# Patient Record
Sex: Female | Born: 1960 | Race: Black or African American | Hispanic: No | Marital: Single | Smoking: Former smoker
Health system: Southern US, Community
[De-identification: ages and names within clinical notes are randomized; demographics above are authoritative.]

## PROBLEM LIST (undated history)

## (undated) DIAGNOSIS — K7460 Unspecified cirrhosis of liver: Secondary | ICD-10-CM

## (undated) DIAGNOSIS — K7210 Chronic hepatic failure without coma: Secondary | ICD-10-CM

## (undated) DIAGNOSIS — R0602 Shortness of breath: Secondary | ICD-10-CM

## (undated) DIAGNOSIS — I509 Heart failure, unspecified: Secondary | ICD-10-CM

## (undated) DIAGNOSIS — R569 Unspecified convulsions: Secondary | ICD-10-CM

## (undated) DIAGNOSIS — E7800 Pure hypercholesterolemia, unspecified: Secondary | ICD-10-CM

## (undated) DIAGNOSIS — D649 Anemia, unspecified: Secondary | ICD-10-CM

## (undated) DIAGNOSIS — M62838 Other muscle spasm: Secondary | ICD-10-CM

## (undated) DIAGNOSIS — Z9289 Personal history of other medical treatment: Secondary | ICD-10-CM

## (undated) DIAGNOSIS — Z8619 Personal history of other infectious and parasitic diseases: Secondary | ICD-10-CM

## (undated) DIAGNOSIS — M549 Dorsalgia, unspecified: Secondary | ICD-10-CM

## (undated) DIAGNOSIS — G039 Meningitis, unspecified: Secondary | ICD-10-CM

## (undated) DIAGNOSIS — N049 Nephrotic syndrome with unspecified morphologic changes: Secondary | ICD-10-CM

## (undated) DIAGNOSIS — I10 Essential (primary) hypertension: Secondary | ICD-10-CM

## (undated) DIAGNOSIS — I872 Venous insufficiency (chronic) (peripheral): Secondary | ICD-10-CM

## (undated) DIAGNOSIS — D591 Autoimmune hemolytic anemia, unspecified: Secondary | ICD-10-CM

## (undated) DIAGNOSIS — I674 Hypertensive encephalopathy: Secondary | ICD-10-CM

## (undated) DIAGNOSIS — K7290 Hepatic failure, unspecified without coma: Secondary | ICD-10-CM

## (undated) DIAGNOSIS — B1920 Unspecified viral hepatitis C without hepatic coma: Secondary | ICD-10-CM

## (undated) DIAGNOSIS — I219 Acute myocardial infarction, unspecified: Secondary | ICD-10-CM

## (undated) DIAGNOSIS — M1990 Unspecified osteoarthritis, unspecified site: Secondary | ICD-10-CM

## (undated) DIAGNOSIS — I639 Cerebral infarction, unspecified: Secondary | ICD-10-CM

## (undated) DIAGNOSIS — G8929 Other chronic pain: Secondary | ICD-10-CM

## (undated) DIAGNOSIS — G43909 Migraine, unspecified, not intractable, without status migrainosus: Secondary | ICD-10-CM

## (undated) DIAGNOSIS — F419 Anxiety disorder, unspecified: Secondary | ICD-10-CM

## (undated) DIAGNOSIS — K219 Gastro-esophageal reflux disease without esophagitis: Secondary | ICD-10-CM

## (undated) DIAGNOSIS — E119 Type 2 diabetes mellitus without complications: Secondary | ICD-10-CM

## (undated) DIAGNOSIS — J189 Pneumonia, unspecified organism: Secondary | ICD-10-CM

## (undated) DIAGNOSIS — F29 Unspecified psychosis not due to a substance or known physiological condition: Secondary | ICD-10-CM

## (undated) DIAGNOSIS — M109 Gout, unspecified: Secondary | ICD-10-CM

## (undated) DIAGNOSIS — B20 Human immunodeficiency virus [HIV] disease: Secondary | ICD-10-CM

## (undated) DIAGNOSIS — N184 Chronic kidney disease, stage 4 (severe): Secondary | ICD-10-CM

## (undated) HISTORY — DX: Personal history of other infectious and parasitic diseases: Z86.19

## (undated) HISTORY — DX: Nephrotic syndrome with unspecified morphologic changes: N04.9

---

## 1979-01-29 HISTORY — PX: HIP PINNING: SHX1757

## 1979-05-31 DIAGNOSIS — Z21 Asymptomatic human immunodeficiency virus [HIV] infection status: Secondary | ICD-10-CM

## 1979-05-31 DIAGNOSIS — B20 Human immunodeficiency virus [HIV] disease: Secondary | ICD-10-CM

## 1979-05-31 HISTORY — DX: Asymptomatic human immunodeficiency virus (hiv) infection status: Z21

## 1979-05-31 HISTORY — DX: Human immunodeficiency virus (HIV) disease: B20

## 2001-01-16 ENCOUNTER — Emergency Department (HOSPITAL_COMMUNITY): Admission: EM | Admit: 2001-01-16 | Discharge: 2001-01-16 | Payer: Self-pay | Admitting: Emergency Medicine

## 2001-01-16 ENCOUNTER — Encounter: Payer: Self-pay | Admitting: Emergency Medicine

## 2001-01-18 ENCOUNTER — Inpatient Hospital Stay (HOSPITAL_COMMUNITY): Admission: EM | Admit: 2001-01-18 | Discharge: 2001-01-24 | Payer: Self-pay

## 2001-01-22 ENCOUNTER — Encounter: Payer: Self-pay | Admitting: Internal Medicine

## 2001-01-31 ENCOUNTER — Encounter: Admission: RE | Admit: 2001-01-31 | Discharge: 2001-01-31 | Payer: Self-pay | Admitting: Internal Medicine

## 2001-02-05 ENCOUNTER — Inpatient Hospital Stay (HOSPITAL_COMMUNITY): Admission: EM | Admit: 2001-02-05 | Discharge: 2001-02-14 | Payer: Self-pay | Admitting: Emergency Medicine

## 2001-02-05 ENCOUNTER — Encounter: Payer: Self-pay | Admitting: "Specialist/Technologist

## 2001-02-05 ENCOUNTER — Encounter: Payer: Self-pay | Admitting: Internal Medicine

## 2001-02-06 ENCOUNTER — Encounter: Payer: Self-pay | Admitting: Internal Medicine

## 2001-02-07 ENCOUNTER — Encounter: Payer: Self-pay | Admitting: Internal Medicine

## 2001-02-12 ENCOUNTER — Encounter: Payer: Self-pay | Admitting: Internal Medicine

## 2001-02-28 ENCOUNTER — Encounter (HOSPITAL_COMMUNITY): Admission: RE | Admit: 2001-02-28 | Discharge: 2001-05-29 | Payer: Self-pay | Admitting: Dentistry

## 2001-03-13 ENCOUNTER — Encounter: Payer: Self-pay | Admitting: Internal Medicine

## 2001-03-13 ENCOUNTER — Inpatient Hospital Stay (HOSPITAL_COMMUNITY): Admission: EM | Admit: 2001-03-13 | Discharge: 2001-03-30 | Payer: Self-pay | Admitting: Emergency Medicine

## 2001-03-14 ENCOUNTER — Encounter: Payer: Self-pay | Admitting: Internal Medicine

## 2001-03-19 ENCOUNTER — Encounter: Payer: Self-pay | Admitting: Interventional Cardiology

## 2001-03-26 ENCOUNTER — Encounter: Payer: Self-pay | Admitting: Internal Medicine

## 2001-04-14 ENCOUNTER — Inpatient Hospital Stay (HOSPITAL_COMMUNITY): Admission: EM | Admit: 2001-04-14 | Discharge: 2001-04-16 | Payer: Self-pay | Admitting: Emergency Medicine

## 2001-04-14 ENCOUNTER — Encounter: Payer: Self-pay | Admitting: Emergency Medicine

## 2001-04-22 ENCOUNTER — Encounter: Payer: Self-pay | Admitting: Emergency Medicine

## 2001-04-22 ENCOUNTER — Inpatient Hospital Stay (HOSPITAL_COMMUNITY): Admission: EM | Admit: 2001-04-22 | Discharge: 2001-04-25 | Payer: Self-pay | Admitting: Emergency Medicine

## 2001-04-30 ENCOUNTER — Ambulatory Visit (HOSPITAL_COMMUNITY): Admission: RE | Admit: 2001-04-30 | Discharge: 2001-04-30 | Payer: Self-pay | Admitting: Infectious Diseases

## 2001-04-30 ENCOUNTER — Encounter: Admission: RE | Admit: 2001-04-30 | Discharge: 2001-04-30 | Payer: Self-pay | Admitting: Infectious Diseases

## 2001-05-15 ENCOUNTER — Emergency Department (HOSPITAL_COMMUNITY): Admission: EM | Admit: 2001-05-15 | Discharge: 2001-05-15 | Payer: Self-pay | Admitting: Emergency Medicine

## 2001-05-19 ENCOUNTER — Emergency Department (HOSPITAL_COMMUNITY): Admission: EM | Admit: 2001-05-19 | Discharge: 2001-05-19 | Payer: Self-pay | Admitting: Emergency Medicine

## 2001-05-19 ENCOUNTER — Encounter: Payer: Self-pay | Admitting: Emergency Medicine

## 2001-06-04 ENCOUNTER — Encounter: Admission: RE | Admit: 2001-06-04 | Discharge: 2001-06-04 | Payer: Self-pay | Admitting: Infectious Diseases

## 2001-06-22 ENCOUNTER — Encounter: Payer: Self-pay | Admitting: Internal Medicine

## 2001-06-22 ENCOUNTER — Inpatient Hospital Stay (HOSPITAL_COMMUNITY): Admission: EM | Admit: 2001-06-22 | Discharge: 2001-06-23 | Payer: Self-pay | Admitting: Emergency Medicine

## 2001-06-22 ENCOUNTER — Encounter: Payer: Self-pay | Admitting: Emergency Medicine

## 2001-06-23 ENCOUNTER — Encounter: Payer: Self-pay | Admitting: Internal Medicine

## 2001-07-06 ENCOUNTER — Encounter (HOSPITAL_COMMUNITY): Admission: RE | Admit: 2001-07-06 | Discharge: 2001-10-04 | Payer: Self-pay | Admitting: Dentistry

## 2001-07-09 ENCOUNTER — Encounter: Admission: RE | Admit: 2001-07-09 | Discharge: 2001-07-09 | Payer: Self-pay | Admitting: Infectious Diseases

## 2001-07-09 ENCOUNTER — Ambulatory Visit (HOSPITAL_COMMUNITY): Admission: RE | Admit: 2001-07-09 | Discharge: 2001-07-09 | Payer: Self-pay | Admitting: Infectious Diseases

## 2001-07-31 ENCOUNTER — Encounter: Admission: RE | Admit: 2001-07-31 | Discharge: 2001-07-31 | Payer: Self-pay | Admitting: Infectious Diseases

## 2001-08-20 ENCOUNTER — Encounter: Admission: RE | Admit: 2001-08-20 | Discharge: 2001-08-20 | Payer: Self-pay | Admitting: Infectious Diseases

## 2001-08-21 ENCOUNTER — Encounter: Admission: RE | Admit: 2001-08-21 | Discharge: 2001-08-21 | Payer: Self-pay | Admitting: Infectious Diseases

## 2001-10-16 ENCOUNTER — Encounter (HOSPITAL_COMMUNITY): Admission: RE | Admit: 2001-10-16 | Discharge: 2002-01-11 | Payer: Self-pay | Admitting: Dentistry

## 2001-11-13 ENCOUNTER — Ambulatory Visit (HOSPITAL_COMMUNITY): Admission: RE | Admit: 2001-11-13 | Discharge: 2001-11-13 | Payer: Self-pay | Admitting: Infectious Diseases

## 2001-11-13 ENCOUNTER — Encounter: Admission: RE | Admit: 2001-11-13 | Discharge: 2001-11-13 | Payer: Self-pay | Admitting: Infectious Diseases

## 2001-11-19 ENCOUNTER — Encounter: Admission: RE | Admit: 2001-11-19 | Discharge: 2001-11-19 | Payer: Self-pay | Admitting: Infectious Diseases

## 2002-03-11 ENCOUNTER — Encounter: Admission: RE | Admit: 2002-03-11 | Discharge: 2002-03-11 | Payer: Self-pay | Admitting: Internal Medicine

## 2002-03-11 ENCOUNTER — Ambulatory Visit (HOSPITAL_COMMUNITY): Admission: RE | Admit: 2002-03-11 | Discharge: 2002-03-11 | Payer: Self-pay | Admitting: Infectious Diseases

## 2002-03-13 ENCOUNTER — Inpatient Hospital Stay (HOSPITAL_COMMUNITY): Admission: EM | Admit: 2002-03-13 | Discharge: 2002-03-15 | Payer: Self-pay | Admitting: *Deleted

## 2002-03-14 ENCOUNTER — Encounter: Payer: Self-pay | Admitting: Internal Medicine

## 2002-03-27 ENCOUNTER — Encounter: Admission: RE | Admit: 2002-03-27 | Discharge: 2002-03-27 | Payer: Self-pay | Admitting: Infectious Diseases

## 2002-04-30 ENCOUNTER — Ambulatory Visit (HOSPITAL_COMMUNITY): Admission: RE | Admit: 2002-04-30 | Discharge: 2002-04-30 | Payer: Self-pay | Admitting: Infectious Diseases

## 2002-04-30 ENCOUNTER — Encounter: Payer: Self-pay | Admitting: Infectious Diseases

## 2002-04-30 ENCOUNTER — Encounter: Admission: RE | Admit: 2002-04-30 | Discharge: 2002-04-30 | Payer: Self-pay | Admitting: Infectious Diseases

## 2002-05-07 ENCOUNTER — Encounter: Admission: RE | Admit: 2002-05-07 | Discharge: 2002-05-07 | Payer: Self-pay | Admitting: Infectious Diseases

## 2002-05-10 ENCOUNTER — Encounter: Payer: Self-pay | Admitting: Emergency Medicine

## 2002-05-10 ENCOUNTER — Emergency Department (HOSPITAL_COMMUNITY): Admission: EM | Admit: 2002-05-10 | Discharge: 2002-05-11 | Payer: Self-pay | Admitting: Emergency Medicine

## 2002-05-15 ENCOUNTER — Encounter: Admission: RE | Admit: 2002-05-15 | Discharge: 2002-05-15 | Payer: Self-pay | Admitting: Infectious Diseases

## 2002-06-26 ENCOUNTER — Inpatient Hospital Stay (HOSPITAL_COMMUNITY): Admission: EM | Admit: 2002-06-26 | Discharge: 2002-06-28 | Payer: Self-pay

## 2002-06-26 ENCOUNTER — Encounter: Payer: Self-pay | Admitting: Infectious Diseases

## 2002-06-26 ENCOUNTER — Encounter: Payer: Self-pay | Admitting: Emergency Medicine

## 2002-06-27 ENCOUNTER — Encounter: Payer: Self-pay | Admitting: Infectious Diseases

## 2002-07-04 ENCOUNTER — Encounter: Admission: RE | Admit: 2002-07-04 | Discharge: 2002-07-04 | Payer: Self-pay | Admitting: Infectious Diseases

## 2002-07-07 ENCOUNTER — Emergency Department (HOSPITAL_COMMUNITY): Admission: EM | Admit: 2002-07-07 | Discharge: 2002-07-07 | Payer: Self-pay

## 2002-07-22 ENCOUNTER — Emergency Department (HOSPITAL_COMMUNITY): Admission: EM | Admit: 2002-07-22 | Discharge: 2002-07-22 | Payer: Self-pay | Admitting: Emergency Medicine

## 2002-07-29 ENCOUNTER — Encounter: Admission: RE | Admit: 2002-07-29 | Discharge: 2002-07-29 | Payer: Self-pay | Admitting: Infectious Diseases

## 2002-08-11 ENCOUNTER — Encounter: Payer: Self-pay | Admitting: Emergency Medicine

## 2002-08-11 ENCOUNTER — Inpatient Hospital Stay (HOSPITAL_COMMUNITY): Admission: EM | Admit: 2002-08-11 | Discharge: 2002-08-14 | Payer: Self-pay | Admitting: Emergency Medicine

## 2002-08-11 ENCOUNTER — Encounter: Payer: Self-pay | Admitting: Internal Medicine

## 2002-08-13 ENCOUNTER — Encounter: Payer: Self-pay | Admitting: Internal Medicine

## 2002-08-27 ENCOUNTER — Encounter: Admission: RE | Admit: 2002-08-27 | Discharge: 2002-08-27 | Payer: Self-pay | Admitting: Infectious Diseases

## 2002-09-10 ENCOUNTER — Encounter: Payer: Self-pay | Admitting: Infectious Diseases

## 2002-09-10 ENCOUNTER — Ambulatory Visit (HOSPITAL_COMMUNITY): Admission: RE | Admit: 2002-09-10 | Discharge: 2002-09-10 | Payer: Self-pay | Admitting: Infectious Diseases

## 2002-09-18 ENCOUNTER — Encounter: Admission: RE | Admit: 2002-09-18 | Discharge: 2002-09-18 | Payer: Self-pay | Admitting: Infectious Diseases

## 2002-09-20 ENCOUNTER — Encounter: Admission: RE | Admit: 2002-09-20 | Discharge: 2002-09-20 | Payer: Self-pay | Admitting: Infectious Diseases

## 2002-09-30 ENCOUNTER — Encounter: Admission: RE | Admit: 2002-09-30 | Discharge: 2002-09-30 | Payer: Self-pay | Admitting: Infectious Diseases

## 2002-10-15 ENCOUNTER — Ambulatory Visit (HOSPITAL_COMMUNITY): Admission: RE | Admit: 2002-10-15 | Discharge: 2002-10-15 | Payer: Self-pay | Admitting: Infectious Diseases

## 2002-10-15 ENCOUNTER — Encounter: Payer: Self-pay | Admitting: Infectious Diseases

## 2002-11-04 ENCOUNTER — Encounter: Admission: RE | Admit: 2002-11-04 | Discharge: 2002-11-04 | Payer: Self-pay | Admitting: Dentistry

## 2002-11-04 ENCOUNTER — Encounter: Admission: RE | Admit: 2002-11-04 | Discharge: 2002-11-04 | Payer: Self-pay | Admitting: Infectious Diseases

## 2002-11-14 ENCOUNTER — Encounter: Payer: Self-pay | Admitting: Internal Medicine

## 2002-11-14 ENCOUNTER — Inpatient Hospital Stay (HOSPITAL_COMMUNITY): Admission: EM | Admit: 2002-11-14 | Discharge: 2002-11-16 | Payer: Self-pay | Admitting: Emergency Medicine

## 2002-11-27 ENCOUNTER — Ambulatory Visit (HOSPITAL_COMMUNITY): Admission: RE | Admit: 2002-11-27 | Discharge: 2002-11-27 | Payer: Self-pay | Admitting: Infectious Diseases

## 2002-11-27 ENCOUNTER — Encounter: Admission: RE | Admit: 2002-11-27 | Discharge: 2002-11-27 | Payer: Self-pay | Admitting: Infectious Diseases

## 2002-12-16 ENCOUNTER — Encounter: Admission: RE | Admit: 2002-12-16 | Discharge: 2002-12-16 | Payer: Self-pay | Admitting: Infectious Diseases

## 2002-12-19 ENCOUNTER — Emergency Department (HOSPITAL_COMMUNITY): Admission: EM | Admit: 2002-12-19 | Discharge: 2002-12-19 | Payer: Self-pay | Admitting: Emergency Medicine

## 2003-01-07 ENCOUNTER — Emergency Department (HOSPITAL_COMMUNITY): Admission: EM | Admit: 2003-01-07 | Discharge: 2003-01-07 | Payer: Self-pay | Admitting: Emergency Medicine

## 2003-01-09 ENCOUNTER — Ambulatory Visit (HOSPITAL_COMMUNITY): Admission: RE | Admit: 2003-01-09 | Discharge: 2003-01-09 | Payer: Self-pay | Admitting: Infectious Diseases

## 2003-01-09 ENCOUNTER — Encounter: Payer: Self-pay | Admitting: Infectious Diseases

## 2003-01-13 ENCOUNTER — Encounter: Admission: RE | Admit: 2003-01-13 | Discharge: 2003-01-13 | Payer: Self-pay | Admitting: Infectious Diseases

## 2003-01-13 ENCOUNTER — Ambulatory Visit (HOSPITAL_COMMUNITY): Admission: RE | Admit: 2003-01-13 | Discharge: 2003-01-13 | Payer: Self-pay | Admitting: Infectious Diseases

## 2003-01-17 ENCOUNTER — Encounter: Admission: RE | Admit: 2003-01-17 | Discharge: 2003-01-17 | Payer: Self-pay | Admitting: Internal Medicine

## 2003-01-20 ENCOUNTER — Encounter: Admission: RE | Admit: 2003-01-20 | Discharge: 2003-01-20 | Payer: Self-pay | Admitting: Infectious Diseases

## 2003-01-29 ENCOUNTER — Encounter: Admission: RE | Admit: 2003-01-29 | Discharge: 2003-01-29 | Payer: Self-pay | Admitting: Infectious Diseases

## 2003-02-04 ENCOUNTER — Emergency Department (HOSPITAL_COMMUNITY): Admission: EM | Admit: 2003-02-04 | Discharge: 2003-02-04 | Payer: Self-pay | Admitting: Emergency Medicine

## 2003-02-05 ENCOUNTER — Emergency Department (HOSPITAL_COMMUNITY): Admission: EM | Admit: 2003-02-05 | Discharge: 2003-02-06 | Payer: Self-pay

## 2003-02-06 ENCOUNTER — Emergency Department (HOSPITAL_COMMUNITY): Admission: EM | Admit: 2003-02-06 | Discharge: 2003-02-06 | Payer: Self-pay | Admitting: Emergency Medicine

## 2003-02-10 ENCOUNTER — Emergency Department (HOSPITAL_COMMUNITY): Admission: EM | Admit: 2003-02-10 | Discharge: 2003-02-10 | Payer: Self-pay | Admitting: Emergency Medicine

## 2003-02-12 ENCOUNTER — Encounter: Admission: RE | Admit: 2003-02-12 | Discharge: 2003-02-12 | Payer: Self-pay | Admitting: Infectious Diseases

## 2003-02-19 ENCOUNTER — Encounter: Admission: RE | Admit: 2003-02-19 | Discharge: 2003-02-19 | Payer: Self-pay | Admitting: Infectious Diseases

## 2003-02-23 ENCOUNTER — Inpatient Hospital Stay (HOSPITAL_COMMUNITY): Admission: EM | Admit: 2003-02-23 | Discharge: 2003-02-26 | Payer: Self-pay | Admitting: Emergency Medicine

## 2003-02-23 ENCOUNTER — Encounter: Payer: Self-pay | Admitting: Emergency Medicine

## 2003-03-06 ENCOUNTER — Encounter: Admission: RE | Admit: 2003-03-06 | Discharge: 2003-03-06 | Payer: Self-pay | Admitting: Internal Medicine

## 2003-03-12 ENCOUNTER — Encounter: Admission: RE | Admit: 2003-03-12 | Discharge: 2003-03-12 | Payer: Self-pay | Admitting: Internal Medicine

## 2003-03-24 ENCOUNTER — Encounter: Admission: RE | Admit: 2003-03-24 | Discharge: 2003-03-24 | Payer: Self-pay | Admitting: Infectious Diseases

## 2003-04-07 ENCOUNTER — Encounter: Admission: RE | Admit: 2003-04-07 | Discharge: 2003-04-07 | Payer: Self-pay | Admitting: Infectious Diseases

## 2003-04-07 ENCOUNTER — Ambulatory Visit (HOSPITAL_COMMUNITY): Admission: RE | Admit: 2003-04-07 | Discharge: 2003-04-07 | Payer: Self-pay | Admitting: Infectious Diseases

## 2003-04-19 ENCOUNTER — Ambulatory Visit (HOSPITAL_COMMUNITY): Admission: RE | Admit: 2003-04-19 | Discharge: 2003-04-19 | Payer: Self-pay | Admitting: Infectious Diseases

## 2003-05-01 ENCOUNTER — Encounter: Admission: RE | Admit: 2003-05-01 | Discharge: 2003-05-01 | Payer: Self-pay | Admitting: Infectious Diseases

## 2003-05-27 ENCOUNTER — Emergency Department (HOSPITAL_COMMUNITY): Admission: EM | Admit: 2003-05-27 | Discharge: 2003-05-27 | Payer: Self-pay | Admitting: Emergency Medicine

## 2003-05-28 ENCOUNTER — Ambulatory Visit (HOSPITAL_COMMUNITY): Admission: RE | Admit: 2003-05-28 | Discharge: 2003-05-28 | Payer: Self-pay | Admitting: Emergency Medicine

## 2003-06-02 ENCOUNTER — Encounter: Admission: RE | Admit: 2003-06-02 | Discharge: 2003-06-02 | Payer: Self-pay | Admitting: Infectious Diseases

## 2003-06-25 ENCOUNTER — Encounter: Admission: RE | Admit: 2003-06-25 | Discharge: 2003-06-25 | Payer: Self-pay | Admitting: Infectious Diseases

## 2003-07-21 ENCOUNTER — Encounter: Admission: RE | Admit: 2003-07-21 | Discharge: 2003-07-21 | Payer: Self-pay | Admitting: Infectious Diseases

## 2003-09-22 ENCOUNTER — Ambulatory Visit (HOSPITAL_COMMUNITY): Admission: RE | Admit: 2003-09-22 | Discharge: 2003-09-22 | Payer: Self-pay | Admitting: Infectious Diseases

## 2003-09-22 ENCOUNTER — Encounter: Admission: RE | Admit: 2003-09-22 | Discharge: 2003-09-22 | Payer: Self-pay | Admitting: Infectious Diseases

## 2013-02-14 ENCOUNTER — Encounter (HOSPITAL_COMMUNITY): Payer: Self-pay | Admitting: Emergency Medicine

## 2013-02-14 ENCOUNTER — Emergency Department (HOSPITAL_COMMUNITY): Payer: Medicaid Other

## 2013-02-14 ENCOUNTER — Inpatient Hospital Stay (HOSPITAL_COMMUNITY)
Admission: EM | Admit: 2013-02-14 | Discharge: 2013-02-16 | DRG: 100 | Disposition: A | Discharge status: Home / Self Care | Payer: Medicaid Other | Attending: Family Medicine | Admitting: Family Medicine

## 2013-02-14 DIAGNOSIS — N189 Chronic kidney disease, unspecified: Secondary | ICD-10-CM | POA: Diagnosis present

## 2013-02-14 DIAGNOSIS — I69959 Hemiplegia and hemiparesis following unspecified cerebrovascular disease affecting unspecified side: Secondary | ICD-10-CM

## 2013-02-14 DIAGNOSIS — G40909 Epilepsy, unspecified, not intractable, without status epilepticus: Principal | ICD-10-CM | POA: Diagnosis present

## 2013-02-14 DIAGNOSIS — R569 Unspecified convulsions: Secondary | ICD-10-CM | POA: Diagnosis present

## 2013-02-14 DIAGNOSIS — B20 Human immunodeficiency virus [HIV] disease: Secondary | ICD-10-CM | POA: Diagnosis present

## 2013-02-14 DIAGNOSIS — I10 Essential (primary) hypertension: Secondary | ICD-10-CM | POA: Diagnosis present

## 2013-02-14 DIAGNOSIS — M109 Gout, unspecified: Secondary | ICD-10-CM | POA: Diagnosis present

## 2013-02-14 DIAGNOSIS — M62838 Other muscle spasm: Secondary | ICD-10-CM | POA: Diagnosis present

## 2013-02-14 DIAGNOSIS — I129 Hypertensive chronic kidney disease with stage 1 through stage 4 chronic kidney disease, or unspecified chronic kidney disease: Secondary | ICD-10-CM | POA: Diagnosis present

## 2013-02-14 DIAGNOSIS — E119 Type 2 diabetes mellitus without complications: Secondary | ICD-10-CM | POA: Diagnosis present

## 2013-02-14 DIAGNOSIS — N184 Chronic kidney disease, stage 4 (severe): Secondary | ICD-10-CM | POA: Diagnosis present

## 2013-02-14 DIAGNOSIS — R809 Proteinuria, unspecified: Secondary | ICD-10-CM

## 2013-02-14 HISTORY — DX: Meningitis, unspecified: G03.9

## 2013-02-14 HISTORY — DX: Unspecified convulsions: R56.9

## 2013-02-14 HISTORY — DX: Cerebral infarction, unspecified: I63.9

## 2013-02-14 LAB — RAPID URINE DRUG SCREEN, HOSP PERFORMED: Barbiturates: NOT DETECTED

## 2013-02-14 LAB — POCT I-STAT, CHEM 8
Chloride: 114 mEq/L — ABNORMAL HIGH (ref 96–112)
HCT: 31 % — ABNORMAL LOW (ref 36.0–46.0)
Potassium: 3.5 mEq/L (ref 3.5–5.1)

## 2013-02-14 LAB — URINALYSIS, ROUTINE W REFLEX MICROSCOPIC
Leukocytes, UA: NEGATIVE
Nitrite: NEGATIVE
Specific Gravity, Urine: 1.013 (ref 1.005–1.030)
pH: 6 (ref 5.0–8.0)

## 2013-02-14 LAB — COMPREHENSIVE METABOLIC PANEL
ALT: 33 U/L (ref 0–35)
Alkaline Phosphatase: 212 U/L — ABNORMAL HIGH (ref 39–117)
CO2: 18 mEq/L — ABNORMAL LOW (ref 19–32)
Calcium: 8.6 mg/dL (ref 8.4–10.5)
GFR calc Af Amer: 37 mL/min — ABNORMAL LOW (ref >90)
GFR calc non Af Amer: 32 mL/min — ABNORMAL LOW (ref >90)
Glucose, Bld: 128 mg/dL — ABNORMAL HIGH (ref 70–99)
Potassium: 3.4 mEq/L — ABNORMAL LOW (ref 3.5–5.1)
Sodium: 138 mEq/L (ref 135–145)

## 2013-02-14 LAB — CBC
MCV: 97.3 fL (ref 78.0–100.0)
Platelets: 204 10*3/uL (ref 150–400)
RBC: 2.99 MIL/uL — ABNORMAL LOW (ref 3.87–5.11)
WBC: 4.3 10*3/uL (ref 4.0–10.5)

## 2013-02-14 LAB — DIFFERENTIAL
Eosinophils Relative: 2 % (ref 0–5)
Lymphocytes Relative: 47 % — ABNORMAL HIGH (ref 12–46)
Lymphs Abs: 2 10*3/uL (ref 0.7–4.0)

## 2013-02-14 LAB — PROTIME-INR: Prothrombin Time: 11.8 seconds (ref 11.6–15.2)

## 2013-02-14 LAB — GLUCOSE, CAPILLARY: Glucose-Capillary: 120 mg/dL — ABNORMAL HIGH (ref 70–99)

## 2013-02-14 LAB — URINE MICROSCOPIC-ADD ON

## 2013-02-14 MED ORDER — LORAZEPAM 1 MG PO TABS: 1.0000 mg | ORAL_TABLET | Freq: Once | ORAL | Status: DC

## 2013-02-14 MED ORDER — LORAZEPAM 2 MG/ML IJ SOLN
2.0000 mg | Freq: Once | INTRAMUSCULAR | Status: AC
  Administered 2013-02-14: 2 mg via INTRAVENOUS

## 2013-02-14 MED ORDER — LORAZEPAM 2 MG/ML IJ SOLN
INTRAMUSCULAR | Status: AC
  Administered 2013-02-14: 1 mg
  Filled 2013-02-14: qty 1

## 2013-02-14 MED ORDER — SODIUM CHLORIDE 0.9 % IV SOLN: 1500.0000 mg | Freq: Once | INTRAVENOUS | Status: DC

## 2013-02-14 MED ORDER — SODIUM CHLORIDE 0.9 % IV SOLN
1500.0000 mg | Freq: Once | INTRAVENOUS | Status: AC
  Administered 2013-02-14: 1500 mg via INTRAVENOUS
  Filled 2013-02-14: qty 30

## 2013-02-14 MED ORDER — SODIUM CHLORIDE 0.9 % IV SOLN
1500.0000 mg | INTRAVENOUS | Status: DC
  Filled 2013-02-14: qty 30

## 2013-02-14 MED ORDER — LORAZEPAM 2 MG/ML IJ SOLN
INTRAMUSCULAR | Status: AC
  Filled 2013-02-14: qty 1

## 2013-02-14 NOTE — ED Notes (Signed)
Per EMS: Pt from home. Last seen normal 2030 before lying down before bed. Family reports pt called out for family and stated she felt like she was going to have a seizure. Pt initially alert to self on scene and could respond to questioning by saying yes. Pt has Right sided weakness in route. NAD.

## 2013-02-14 NOTE — Consult Note (Addendum)
Neurology Consultation Reason for Consult: Right sided weakness Referring Physician: Alric Ran  CC: Right sided weakness  History is obtained from:Sisters(by phone)  HPI: Michelle [name] is a 52 y.o. 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 Per another sister, she has a history of recurrent seizures and has appeared to have stroke-like symptoms after seizures in the past, though she thinks that it is sometimes one side and sometimes another.   On arrival, she was seen ot have rhythmic right upper arm shaking repeatedly and was treated with IV ativan. The movements ceased, but her speech remained only a single word "ok." she was treated with repeated rounds of ativan for a total of 5 mg.     ROS: Unable to assess secondary to patient's altered mental status.    Past Medical History  Diagnosis Date  . Seizures   . Stroke   . Meningitis   . Diabetes mellitus without complication     Family History: Unable to assess secondary to patient's altered mental status.    Social History: Unable to assess secondary to patient's altered mental status.   Exam: Current vital signs: BP 176/75  Pulse 75  Temp(Src) 99.2 F (37.3 C) (Oral)  Resp 28  SpO2 99% Vital signs in last 24 hours: Temp:  [99.2 F (37.3 C)] 99.2 F (37.3 C) (09/18 2233) Pulse Rate:  [74-75] 75 (09/18 2233) Resp:  [26-28] 28 (09/18 2233) BP: (176)/(75) 176/75 mmHg (09/18 2233) SpO2:  [99 %-100 %] 99 % (09/18 2233) FiO2 (%):  [21 %] 21 % (09/18 2215)  General: in bed, NAD CV: RRR Mental Status: Patient is awake, alert, responds "yeah" or "ok" to every question  Does not follow commands.  Cranial Nerves: II: No blink to threat from the  right.  Pupils are equal, round, and reactive to light.  Discs are difficult to visualize.  III,IV, VI: EOMI without ptosis or diploplia.  V: Facial sensation is symmetric to temperature VII: Facial movement is symmetric.  VIII: hearing is intact to voice X: Unable to assess secondary to patient's altered mental status.  XI: Unable to assess secondary to patient's altered mental status.  XII: Unable to assess secondary to patient's altered mental status.  Motor: Tone is initially flacid on right when not shaking, but after treatment this improves. Bulk is normal. 5/5 strength was present on the left. She briskly withdrew to right toe pain, withdraws to pain in the right arm after treatment, but not before.  Sensory: Responds to nox stim in right leg, and left arma and leg. Initially no repsonse to nox stim in right arm.  Deep Tendon Reflexes: 2+ and symmetric in the biceps and patellae. Cerebellar: Unable to assess secondary to patient's altered mental status.  Gait: Unable to assess secondary to patient's altered mental status.    I have reviewed labs in epic and the results pertinent to this consultation are: Elevated creatinine  I have reviewed the images obtained:CT head - atrophy, bu no acute change.   Impression: 52 yo F with previous history of strkoe-like symptoms following seizure who was apparently having seizure on arrival. She has had improvemetn in right arm movements, and some increased verbal output since ativan treatment. At this time, I  feel that her deficits are post ictal. Her medications from home only included ativan as an AED.  She is currently being loaded with phenytoin and has improved movements of her right arm as well as more verbal output.   Recommendations: 1) EEG 2) If continued aphasia, would pursue MRI.  3) dilantin 1500mg  load(weight approximately 75 kg). Then dilantin 100mg  TID, though would consider change to a newer agent for chronic prophylaxis.   4)  Will continue to follow with you.     Ritta Slot, MD [facility] [phone]  If 7pm- 7am, please page neurology on call at [phone].

## 2013-02-14 NOTE — ED Provider Notes (Signed)
CSN: [id number]     Arrival date & time 02/14/13  2204 History   First MD Initiated Contact with Patient 02/14/13 2213     Chief Complaint  Patient presents with  . Code Stroke   (Consider location/radiation/quality/duration/timing/severity/associated sxs/prior Treatment) HPI Comments: 52 yo female brought by EMS with acute onset of AMS and right arm weakness. EMS states she told family she felt like she was going to have a seizure but did not have full tonic clonic shaking and then came to her present state. She has been awake but only repeats phrases for EMS. She has a long standing seizure history per family. No infectious symptoms per EMS. Her BP was hypertensive into the 190s systolic. Other history is unavailable at this time as there is no family and patient can't answer questions.   The history is provided by the EMS personnel. The history is limited by the condition of the patient.    No past medical history on file. No past surgical history on file. No family history on file. History  Substance Use Topics  . Smoking status: Not on file  . Smokeless tobacco: Not on file  . Alcohol Use: Not on file   OB History   No data available     Review of Systems  Unable to perform ROS: Mental status change    Allergies  Review of patient's allergies indicates not on file.  Home Medications  No current outpatient prescriptions on file. BP 157/72  Pulse 59  Temp(Src) 99.2 F (37.3 C) (Oral)  Resp 20  SpO2 100% Physical Exam  Nursing note and vitals reviewed. Constitutional: She appears well-developed and well-nourished. No distress.  HENT:  Head: Normocephalic and atraumatic.  Right Ear: External ear normal.  Left Ear: External ear normal.  Nose: Nose normal.  Eyes: Pupils are equal, round, and reactive to light. Right eye exhibits no discharge. Left eye exhibits no discharge.  Cardiovascular: Normal rate, regular rhythm and normal heart sounds.   Pulmonary/Chest:  Effort normal and breath sounds normal.  Abdominal: Soft. There is no tenderness.  Neurological: She is alert. GCS eye subscore is 4. GCS verbal subscore is 3. GCS motor subscore is 5.  Right arm is flaccid  Skin: Skin is warm and dry.    ED Course  Procedures (including critical care time) Labs Review Labs Reviewed  CBC - Abnormal; Notable for the following:    RBC 2.99 (*)    Hemoglobin 10.3 (*)    HCT 29.1 (*)    MCH 34.4 (*)    All other components within normal limits  DIFFERENTIAL - Abnormal; Notable for the following:    Neutrophils Relative % 37 (*)    Neutro Abs 1.6 (*)    Lymphocytes Relative 47 (*)    Monocytes Relative 14 (*)    All other components within normal limits  COMPREHENSIVE METABOLIC PANEL - Abnormal; Notable for the following:    Potassium 3.4 (*)    CO2 18 (*)    Glucose, Bld 128 (*)    BUN 41 (*)    Creatinine, Ser 1.74 (*)    Albumin 2.9 (*)    AST 48 (*)    Alkaline Phosphatase 212 (*)    Total Bilirubin 0.2 (*)    GFR calc non Af Amer 32 (*)    GFR calc Af Amer 37 (*)    All other components within normal limits  URINALYSIS, ROUTINE W REFLEX MICROSCOPIC - Abnormal; Notable for the following:  Hgb urine dipstick SMALL (*)    Protein, ur >300 (*)    All other components within normal limits  GLUCOSE, CAPILLARY - Abnormal; Notable for the following:    Glucose-Capillary 120 (*)    All other components within normal limits  URINE MICROSCOPIC-ADD ON - Abnormal; Notable for the following:    Casts GRANULAR CAST (*)    All other components within normal limits  POCT I-STAT, CHEM 8 - Abnormal; Notable for the following:    Chloride 114 (*)    BUN 39 (*)    Creatinine, Ser 1.90 (*)    Glucose, Bld 132 (*)    Calcium, Ion 1.09 (*)    Hemoglobin 10.5 (*)    HCT 31.0 (*)    All other components within normal limits  ETHANOL  PROTIME-INR  APTT  TROPONIN I  URINE RAPID DRUG SCREEN (HOSP PERFORMED)  POCT I-STAT TROPONIN I   Imaging  Review Ct Head Wo Contrast  02/14/2013   CLINICAL DATA:  Right-sided weakness. Code stroke.  EXAM: CT HEAD WITHOUT CONTRAST  TECHNIQUE: Contiguous axial images were obtained from the base of the skull through the vertex without intravenous contrast.  COMPARISON:  Head CT 02/23/2003.  Brain MRI 04/19/2003.  FINDINGS: Mild cerebral atrophy. Patchy and confluent areas of decreased attenuation are noted throughout the deep and periventricular white matter of the cerebral hemispheres bilaterally, compatible with chronic microvascular ischemic disease. No acute intracranial abnormalities. Specifically, no evidence of acute intracranial hemorrhage, no definite findings of acute/subacute cerebral ischemia, no mass, mass effect, hydrocephalus or abnormal intra or extra-axial fluid collections. Visualized paranasal sinuses and mastoids are well pneumatized. No acute displaced skull fractures are identified.  IMPRESSION: * No acute intracranial abnormalities. *Mild cerebral atrophy with chronic microvascular ischemic changes in the cerebral white matter, as above.  CriticalValue/emergent results were called by telephone at the time of interpretation on 02/14/2013 at 10:35 PMto Otha Monical , who verbally acknowledged these results.   Electronically Signed   By: Trudie Reed M.D.   On: 02/14/2013 22:24    MDM   1. Seizure    Patient's head CT is normal and her sx improved when neuro gave ativan and fosphenytoin. Patient became post-ictal after this. As she is improving I feel her airway is stable and she is appropriate for admission to the floor with the hospitalist and EEG monitoring in the AM. No signs of infection.     Audree Camel, MD 02/15/13 [phone]

## 2013-02-14 NOTE — Code Documentation (Signed)
Patient in normal state of health with family at dinner. Around 2030 patient felt as though she was about to have a seizure. Family noted patient's normal seizure activity. Right sided weakness noted after seizure activity. EMS called. Code stroke called at 2148, patient arrived at 2206 via [facility], LKW 2030, EDP exam at 2206, stroke team arrived at 2157, neurologist arrived at 2205, patient arrived in CT at 2211 after receiving 1mg  ativan at the bridge for noted seizure activity. Phlebotomist arrived at 2156, CT read by Dr. Amada Jupiter at 2215. 2 mg ativan received upon arrival back to patient's room. Initial NIH 12, Dr. Amada Jupiter spoke to family that indicated that patient often has right sided weakness and confusion after seizure activity. Patient also has history of CVA which affected right side extremities. Orders received for an additional 2mg  ativan. Patient begins to have more muscle strength on right side but continues to be confused and unable to follow commands or answer questions appropriately. Orders received for dilantin and fosphenytoin. Will continue to monitor.

## 2013-02-15 ENCOUNTER — Inpatient Hospital Stay (HOSPITAL_COMMUNITY): Payer: Medicaid Other

## 2013-02-15 ENCOUNTER — Encounter (HOSPITAL_COMMUNITY): Payer: Self-pay | Admitting: *Deleted

## 2013-02-15 DIAGNOSIS — R569 Unspecified convulsions: Secondary | ICD-10-CM

## 2013-02-15 DIAGNOSIS — R809 Proteinuria, unspecified: Secondary | ICD-10-CM

## 2013-02-15 DIAGNOSIS — N184 Chronic kidney disease, stage 4 (severe): Secondary | ICD-10-CM | POA: Diagnosis present

## 2013-02-15 DIAGNOSIS — I10 Essential (primary) hypertension: Secondary | ICD-10-CM | POA: Diagnosis present

## 2013-02-15 DIAGNOSIS — N189 Chronic kidney disease, unspecified: Secondary | ICD-10-CM

## 2013-02-15 LAB — GLUCOSE, CAPILLARY: Glucose-Capillary: 176 mg/dL — ABNORMAL HIGH (ref 70–99)

## 2013-02-15 MED ORDER — PHENYTOIN SODIUM 50 MG/ML IJ SOLN
100.0000 mg | Freq: Three times a day (TID) | INTRAMUSCULAR | Status: DC
  Administered 2013-02-15 – 2013-02-16 (×4): 100 mg via INTRAVENOUS
  Filled 2013-02-15 (×7): qty 2

## 2013-02-15 MED ORDER — RAMIPRIL 5 MG PO CAPS
5.0000 mg | ORAL_CAPSULE | Freq: Every day | ORAL | Status: DC
  Administered 2013-02-15: 5 mg via ORAL
  Filled 2013-02-15 (×2): qty 1

## 2013-02-15 MED ORDER — PNEUMOCOCCAL VAC POLYVALENT 25 MCG/0.5ML IJ INJ
0.5000 mL | INJECTION | INTRAMUSCULAR | Status: AC
  Administered 2013-02-16: 0.5 mL via INTRAMUSCULAR
  Filled 2013-02-15: qty 0.5

## 2013-02-15 MED ORDER — AMLODIPINE BESYLATE 5 MG PO TABS
5.0000 mg | ORAL_TABLET | Freq: Every day | ORAL | Status: DC
  Administered 2013-02-15: 5 mg via ORAL
  Filled 2013-02-15 (×2): qty 1

## 2013-02-15 MED ORDER — INFLUENZA VAC SPLIT QUAD 0.5 ML IM SUSP
0.5000 mL | INTRAMUSCULAR | Status: AC
  Administered 2013-02-16: 0.5 mL via INTRAMUSCULAR
  Filled 2013-02-15: qty 0.5

## 2013-02-15 MED ORDER — HEPARIN SODIUM (PORCINE) 5000 UNIT/ML IJ SOLN
5000.0000 [IU] | Freq: Three times a day (TID) | INTRAMUSCULAR | Status: DC
  Administered 2013-02-15 – 2013-02-16 (×4): 5000 [IU] via SUBCUTANEOUS
  Filled 2013-02-15 (×7): qty 1

## 2013-02-15 MED ORDER — SODIUM CHLORIDE 0.9 % IJ SOLN
3.0000 mL | Freq: Two times a day (BID) | INTRAMUSCULAR | Status: DC
  Administered 2013-02-15 (×2): 3 mL via INTRAVENOUS

## 2013-02-15 NOTE — Progress Notes (Signed)
NEURO HOSPITALIST PROGRESS NOTE   SUBJECTIVE:                                                                                                                         Patient remains slightly post-ictal, she is awake and able to follow commands --even states she was on Keppra prior to moving to [place].  She cannot recall the dose or who she received her medications from.  She continues to tell me to call her sister.  I have called both her aunt ( contact info on face sheet) and sister [phone]) who both do not know what she was on. I am attempting to obtain her other sisters number.  OBJECTIVE:                                                                                                                           Vital signs in last 24 hours: Temp:  [97.6 F (36.4 C)-99.2 F (37.3 C)] 98.3 F (36.8 C) (09/19 1000) Pulse Rate:  [54-80] 60 (09/19 1000) Resp:  [13-29] 18 (09/19 1000) BP: (124-177)/(66-93) 174/93 mmHg (09/19 1000) SpO2:  [98 %-100 %] 100 % (09/19 1000) FiO2 (%):  [21 %] 21 % (09/18 2215) Weight:  [87.635 kg (193 lb 3.2 oz)] 87.635 kg (193 lb 3.2 oz) (09/19 0246)  Intake/Output from previous day: 09/18 0701 - 09/19 0700 In: 360 [P.O.:360] Out: -  Intake/Output this shift:   Nutritional status: Carb Control  Past Medical History  Diagnosis Date  . Seizures   . Stroke   . Meningitis   . Diabetes mellitus without complication     Neurologic Exam:  Mental Status: Alert, not oriented and still slightly pos-ictal.  She states she was on Keppra but cannot recall dose or her neurologist.  Speech fluent without evidence of aphasia.  Able to follow 3 step commands without difficulty. Cranial Nerves: II: Visual fields grossly normal, pupils equal, round, reactive to light and accommodation III,IV, VI: ptosis not present, extra-ocular motions intact bilaterally V,VII: smile symmetric, facial light touch sensation normal  bilaterally VIII: hearing normal bilaterally IX,X: gag reflex present XI: bilateral shoulder shrug XII: midline tongue extension Motor: Right : Upper extremity   5/5  Left:     Upper extremity   5/5  Lower extremity   5/5     Lower extremity   5/5 Tone and bulk:normal tone throughout; no atrophy noted Sensory: Pinprick and light touch intact throughout, bilaterally Deep Tendon Reflexes:  Right: Upper Extremity   Left: Upper extremity   biceps (C-5 to C-6) 2/4   biceps (C-5 to C-6) 2/4 tricep (C7) 2/4    triceps (C7) 2/4 Brachioradialis (C6) 2/4  Brachioradialis (C6) 2/4  Lower Extremity Lower Extremity  quadriceps (L-2 to L-4) 2/4   quadriceps (L-2 to L-4) 2/4 Achilles (S1) 2/4   Achilles (S1) 2/4  Plantars: Right: downgoing   Left: downgoing CV: pulses palpable throughout    Lab Results: No results found for this basename: cbc, bmp, coags, chol, tri, ldl, hga1c   Lipid Panel No results found for this basename: CHOL, TRIG, HDL, CHOLHDL, VLDL, LDLCALC,  in the last 72 hours  Studies/Results: Ct Head Wo Contrast  02/14/2013   CLINICAL DATA:  Right-sided weakness. Code stroke.  EXAM: CT HEAD WITHOUT CONTRAST  TECHNIQUE: Contiguous axial images were obtained from the base of the skull through the vertex without intravenous contrast.  COMPARISON:  Head CT 02/23/2003.  Brain MRI 04/19/2003.  FINDINGS: Mild cerebral atrophy. Patchy and confluent areas of decreased attenuation are noted throughout the deep and periventricular white matter of the cerebral hemispheres bilaterally, compatible with chronic microvascular ischemic disease. No acute intracranial abnormalities. Specifically, no evidence of acute intracranial hemorrhage, no definite findings of acute/subacute cerebral ischemia, no mass, mass effect, hydrocephalus or abnormal intra or extra-axial fluid collections. Visualized paranasal sinuses and mastoids are well pneumatized. No acute displaced skull fractures are identified.   IMPRESSION: * No acute intracranial abnormalities. *Mild cerebral atrophy with chronic microvascular ischemic changes in the cerebral white matter, as above.  CriticalValue/emergent results were called by telephone at the time of interpretation on 02/14/2013 at 10:35 PMto SCOTT GOLDSTON , who verbally acknowledged these results.   Electronically Signed   By: Trudie Reed M.D.   On: 02/14/2013 22:24    MEDICATIONS                                                                                                                        Scheduled: . heparin  5,000 Units Subcutaneous Q8H  . [START ON 02/16/2013] influenza vac split quadrivalent PF  0.5 mL Intramuscular Tomorrow-1000  . LORazepam  1 mg Oral Once  . phenytoin (DILANTIN) IV  100 mg Intravenous Q8H  . [START ON 02/16/2013] pneumococcal 23 valent vaccine  0.5 mL Intramuscular Tomorrow-1000  . sodium chloride  3 mL Intravenous Q12H    ASSESSMENT/PLAN:  52 YO female with seizure disorder presenting to hospital with breakthrough seizure.  Patient remains slightly post ictal but now recalls she was on "Keppra". The sister who may know the dose is not answering her phone [phone]).   Recommend: 1) Continue Dilantin 100 mg TID 2) Continue to attempt to contact her sister [phone]) to find out what dose she was on.  3) Continue seizure precaution.  4) No driving, operating heavy machinery, perform activities at heights, swimming or participation in water activities until release by outpatient physician.     Assessment and plan discussed with with attending physician and they are in agreement.    Felicie Morn PA-C [facility] [phone]  02/15/2013, 10:51 AM

## 2013-02-15 NOTE — Evaluation (Signed)
Physical Therapy Evaluation/Discharge Patient Details Name: Michelle [name] MRN: [id number] DOB: 1961-01-24 Today's Date: 02/15/2013 Time: 1191-4782 PT Time Calculation (min): 20 min  PT Assessment / Plan / Recommendation History of Present Illness  52 y.o. female admitted to [facility] for seizure activity  Clinical Impression  Pt is at baseline level of functioning.  Steady on her feet.  No functional weakness noted.  PT to sign off.      PT Assessment  Patent does not need any further PT services    Follow Up Recommendations  No PT follow up    Does the patient have the potential to tolerate intense rehabilitation     NA  Barriers to Discharge   None      Equipment Recommendations  None recommended by PT    Recommendations for Other Services   None  Frequency   NA- one time eval   Precautions / Restrictions Precautions Precautions: Other (comment) (seizure)   Pertinent Vitals/Pain See vitals flow sheet.       Mobility  Bed Mobility Bed Mobility: Not assessed (pt seated EOB upon entering room) Transfers Transfers: Sit to Stand;Stand to Sit Sit to Stand: 6: Modified independent (Device/Increase time);With upper extremity assist;With armrests;From bed;From toilet Stand to Sit: 6: Modified independent (Device/Increase time);With upper extremity assist;With armrests;To bed;To toilet Details for Transfer Assistance: relies on hands for stability during transitions Ambulation/Gait Ambulation/Gait Assistance: 6: Modified independent (Device/Increase time) Ambulation Distance (Feet): 200 Feet Assistive device: None Ambulation/Gait Assistance Details: slow, but steady gait.   Gait Pattern: Within Functional Limits Stairs: Yes Stairs Assistance: 6: Modified independent (Device/Increase time) Stair Management Technique: Alternating pattern;Two rails Number of Stairs: 5        PT Goals(Current goals can be found in the care plan section) Acute Rehab PT Goals Patient Stated  Goal: to go home today PT Goal Formulation: No goals set, d/c therapy  Visit Information  Last PT Received On: 02/15/13 Assistance Needed: +1 History of Present Illness: 52 y.o. female admitted to [facility] for seizure activity       Prior Functioning  Home Living Family/patient expects to be discharged to:: Private residence Living Arrangements: Other relatives (sister and brother in law) Available Help at Discharge: Family;Available PRN/intermittently Type of Home: House Home Access: Stairs to enter Entrance Stairs-Number of Steps: 3 Home Layout: Two level ("I can stay downstairs") Home Equipment: Walker - 2 wheels;Cane - single point Additional Comments: pt reports she is in the process of moving out of her home and getting a one level apartment.   Prior Function Level of Independence: Independent with assistive device(s) Communication Communication: No difficulties    Cognition  Cognition Arousal/Alertness: Awake/alert Behavior During Therapy: WFL for tasks assessed/performed Overall Cognitive Status: Within Functional Limits for tasks assessed    Extremity/Trunk Assessment Upper Extremity Assessment Upper Extremity Assessment: Overall WFL for tasks assessed Lower Extremity Assessment Lower Extremity Assessment: Overall WFL for tasks assessed Cervical / Trunk Assessment Cervical / Trunk Assessment: Normal   Balance High Level Balance High Level Balance Activites: Turns;Direction changes High Level Balance Comments: mod I, no LOB noted  End of Session PT - End of Session Activity Tolerance: Patient tolerated treatment well Patient left: in bed;with call bell/phone within reach;with family/visitor present (seated EOB)    Lurena Joiner B. Dreamer Carillo, PT, DPT [phone]   02/15/2013, 5:05 PM

## 2013-02-15 NOTE — H&P (Signed)
[facility] History and Physical  Michelle [name] ZHY:[id number] DOB: [birth year] DOA: 02/14/2013  Referring physician: ED PCP: No primary provider on file.   Chief Complaint: Seizure, right sided weakness  HPI: Michelle [name] is a 52 y.o. female history of previous stroke with right hemiparesis(though able to eat), cryptococcal meningitis in 2004, as well as seizure disorder for approximately 20 years presents after stating to her sister that she was "about to have a seizure."  She subsequently had shaking / seizure like activity.  Per family, she has a history of recurrent seizures and often has had unilateral weakness after seizures in the past.  On arrival to the ED she was noted to have rhythmic RUE shaking treated with ativan which cased the shaking to stop.  Speech remained impaired.  She was treated with a total of 5mg  of ativan.   Review of Systems: Unable to assess due to mental status.  Past Medical History  Diagnosis Date  . Seizures   . Stroke   . Meningitis   . Diabetes mellitus without complication    History reviewed. No pertinent past surgical history. Social History:  has no tobacco, alcohol, and drug history on file. Cannot obtain due to mental status  No Known Allergies  No family history on file.   Prior to Admission medications   Not on File   Physical Exam: Filed Vitals:   02/15/13 0400  BP: 177/91  Pulse: 58  Temp: 97.9 F (36.6 C)  Resp: 18    General:  NAD, resting comfortably in bed, will wake up to light touch, Eyes: PEERLA EOMI ENT: mucous membranes moist Neck: supple w/o JVD Cardiovascular: RRR w/o MRG Respiratory: CTA B Abdomen: soft, nt, nd, bs+ Skin: no rash nor lesion Musculoskeletal: MAE, full ROM all 4 extremities Psychiatric: unable to assess Neurologic: follows some commands, nods head, but mental status still altered significantly,   Labs on Admission:  Basic Metabolic Panel:  Recent Labs Lab 02/14/13 2215  02/14/13 2219  NA 138 143  K 3.4* 3.5  CL 106 114*  CO2 18*  --   GLUCOSE 128* 132*  BUN 41* 39*  CREATININE 1.74* 1.90*  CALCIUM 8.6  --    Liver Function Tests:  Recent Labs Lab 02/14/13 2215  AST 48*  ALT 33  ALKPHOS 212*  BILITOT 0.2*  PROT 8.1  ALBUMIN 2.9*   No results found for this basename: LIPASE, AMYLASE,  in the last 168 hours No results found for this basename: AMMONIA,  in the last 168 hours CBC:  Recent Labs Lab 02/14/13 2215 02/14/13 2219  WBC 4.3  --   NEUTROABS 1.6*  --   HGB 10.3* 10.5*  HCT 29.1* 31.0*  MCV 97.3  --   PLT 204  --    Cardiac Enzymes:  Recent Labs Lab 02/14/13 2215  TROPONINI <0.30    BNP (last 3 results) No results found for this basename: PROBNP,  in the last 8760 hours CBG:  Recent Labs Lab 02/14/13 2228  GLUCAP 120*    Radiological Exams on Admission: Ct Head Wo Contrast  02/14/2013   CLINICAL DATA:  Right-sided weakness. Code stroke.  EXAM: CT HEAD WITHOUT CONTRAST  TECHNIQUE: Contiguous axial images were obtained from the base of the skull through the vertex without intravenous contrast.  COMPARISON:  Head CT 02/23/2003.  Brain MRI 04/19/2003.  FINDINGS: Mild cerebral atrophy. Patchy and confluent areas of decreased attenuation are noted throughout the deep and periventricular white matter of  the cerebral hemispheres bilaterally, compatible with chronic microvascular ischemic disease. No acute intracranial abnormalities. Specifically, no evidence of acute intracranial hemorrhage, no definite findings of acute/subacute cerebral ischemia, no mass, mass effect, hydrocephalus or abnormal intra or extra-axial fluid collections. Visualized paranasal sinuses and mastoids are well pneumatized. No acute displaced skull fractures are identified.  IMPRESSION: * No acute intracranial abnormalities. *Mild cerebral atrophy with chronic microvascular ischemic changes in the cerebral white matter, as above.  CriticalValue/emergent  results were called by telephone at the time of interpretation on 02/14/2013 at 10:35 PMto SCOTT GOLDSTON , who verbally acknowledged these results.   Electronically Signed   By: Trudie Reed M.D.   On: 02/14/2013 22:24    EKG: Independently reviewed.  Assessment/Plan Active Problems:   Seizure   1.  Seizure - patient with previous history of stroke like symptoms following seizure in the past who apparently was having seizure on arrival.  She has had some improvement in mental status since arrival but is currently very sleepy likely due to the medications she was given to break the seizure (5mg  of ativan among others).  Has received dilantin load and dosing for now, likely to be changed to newer agent by neurology per their note.  EEG ordered, if aphasia continues order MRI.  Neurology has seen patient and their consult note is in the chart.   Code Status: Full Code (must indicate code status--if unknown or must be presumed, indicate so) Family Communication: No family in room (indicate person spoken with, if applicable, with phone number if by telephone) Disposition Plan: Admit to inpatient (indicate anticipated LOS)  Time spent: 70 min  GARDNER, JARED M. [facility] Pager [phone] If 7PM-7AM, please contact night-coverage [email or web address] Password [facility] 02/15/2013, 4:48 AM

## 2013-02-15 NOTE — Procedures (Addendum)
ELECTROENCEPHALOGRAM REPORT   Patient: Michelle [name]       Room #: 1O10 EEG No. ID: [id number] Age: 52 y.o.        Sex: female Referring Physician: Maryln Manuel Report Date:  02/15/2013        Interpreting Physician: Aline Brochure  History: Michelle [name] is an 52 y.o. female with a history of seizure disorder, previous stroke and cryptococcal meningitis in 2004, presenting with recurrent generalized seizures. Seizure activity ceased following treatment with Ativan and parenteral phenytoin. Patient has remained somewhat confused, however.  Indications for study:  Rule out continuous seizure activity.  Technique: This is an 18 channel routine scalp EEG performed at the bedside with bipolar and monopolar montages arranged in accordance to the international 10/20 system of electrode placement.   Description: This EEG recording was performed during wakefulness and during sleep. During wakefulness the background activity consisted of low amplitude 1-2 Hz diffuse symmetrical irregular delta activity with superimposed low amplitude 20-25 Hz beta activity recorded primarily from the frontal and central regions, as well as non-hertz of rhythm recorded from the posterior head regions. Photic stimulation produced a minimal bilateral occipital driving response. Hyperventilation was not performed. During sleep there was slowing of background activity with mixed irregular delta and theta activity diffusely along with normal occurrences of sleep spindles, vertex waves and arousal responses. No epileptiform discharges recorded during wakefulness nor during sleep.  Interpretation: CT is abnormal with mild generalized nonspecific continuous slowing of cerebral activity, consistent with likely postictal CNS depression. This data was slowing can be seen with metabolic as well as degenerative central nervous system disorders, however. No evidence of epileptic activity was recorded.   Venetia Maxon M.D. [facility] [phone]

## 2013-02-15 NOTE — Progress Notes (Signed)
UR complete.  Ayaan Shutes RN, MSN 

## 2013-02-15 NOTE — Progress Notes (Signed)
Patient family in to visit patient.  Patient wanting to leave AMA.  Explained to patient that insurance will not cover her stay if she leaves AMA.  Paged Dr. Laural Benes who called to say the patient was not ready to be discharged per neurology.  Asked if I could ask the family about patient's at home dosing of Keppra.  Explained to patient and patient agreeable to stay.  Patient family going home to get patient's medications so that we can reconcile them.  Will continue to monitor.  Lance Bosch, RN

## 2013-02-15 NOTE — Progress Notes (Signed)
Routine EEG completed.  

## 2013-02-15 NOTE — Progress Notes (Signed)
[facility] PROGRESS NOTE  Michelle [name] GNF:[id number] DOB: August 06, 1960 DOA: 02/14/2013 PCP: No primary provider on file.  Assessment/Plan: Seizure - patient with previous history of stroke like symptoms following seizure in the past who apparently was having seizure on arrival. She has had some improvement in mental status since arrival but is still postictal.  Neuro would like to place her back on keppra but unaware of her home doses.  Pt unaware and several family members contacted and not able to provide any further information.  Has received dilantin load and dosing for now. EEG ordered, if aphasia continues order MRI.  Neurology has seen patient and their consult note is in the chart.   Hypertension- suboptimally controlled, add amlodipine today, consider low dose ACEI given her proteinuria. Pt needs close outpatient PCP followup.   Proteinuria - as above  Code Status: Full Code (must indicate code status--if unknown or must be presumed, indicate so)  Family Communication: No family in room (indicate person spoken with, if applicable, with phone number if by telephone)  Disposition Plan: Admit to inpatient (indicate anticipated LOS)  HPI/Subjective: Pt is improving but still post-ictal.   Objective: Filed Vitals:   02/15/13 1000  BP: 174/93  Pulse: 60  Temp: 98.3 F (36.8 C)  Resp: 18    Intake/Output Summary (Last 24 hours) at 02/15/13 1110 Last data filed at 02/15/13 0700  Gross per 24 hour  Intake    360 ml  Output      0 ml  Net    360 ml   Filed Weights   02/15/13 0246  Weight: 87.635 kg (193 lb 3.2 oz)    Exam:  General: NAD, resting comfortably in bed, will wake up to light touch,  Eyes: PEERLA EOMI  ENT: mucous membranes moist  Neck: supple w/o JVD  Cardiovascular: RRR w/o MRG  Respiratory: CTA B  Abdomen: soft, nt, nd, bs+  Skin: no rash nor lesion  Musculoskeletal: full ROM all 4 extremities  Psychiatric: normal affect noted.   Neurologic:  follows some commands, nods head, but mental status still altered significantly,    Data Reviewed: Basic Metabolic Panel:  Recent Labs Lab 02/14/13 2215 02/14/13 2219  NA 138 143  K 3.4* 3.5  CL 106 114*  CO2 18*  --   GLUCOSE 128* 132*  BUN 41* 39*  CREATININE 1.74* 1.90*  CALCIUM 8.6  --    Liver Function Tests:  Recent Labs Lab 02/14/13 2215  AST 48*  ALT 33  ALKPHOS 212*  BILITOT 0.2*  PROT 8.1  ALBUMIN 2.9*   No results found for this basename: LIPASE, AMYLASE,  in the last 168 hours No results found for this basename: AMMONIA,  in the last 168 hours CBC:  Recent Labs Lab 02/14/13 2215 02/14/13 2219  WBC 4.3  --   NEUTROABS 1.6*  --   HGB 10.3* 10.5*  HCT 29.1* 31.0*  MCV 97.3  --   PLT 204  --    Cardiac Enzymes:  Recent Labs Lab 02/14/13 2215  TROPONINI <0.30   BNP (last 3 results) No results found for this basename: PROBNP,  in the last 8760 hours CBG:  Recent Labs Lab 02/14/13 2228  GLUCAP 120*    No results found for this or any previous visit (from the past 240 hour(s)).   Studies: Ct Head Wo Contrast  02/14/2013   CLINICAL DATA:  Right-sided weakness. Code stroke.  EXAM: CT HEAD WITHOUT CONTRAST  TECHNIQUE: Contiguous axial images  were obtained from the base of the skull through the vertex without intravenous contrast.  COMPARISON:  Head CT 02/23/2003.  Brain MRI 04/19/2003.  FINDINGS: Mild cerebral atrophy. Patchy and confluent areas of decreased attenuation are noted throughout the deep and periventricular white matter of the cerebral hemispheres bilaterally, compatible with chronic microvascular ischemic disease. No acute intracranial abnormalities. Specifically, no evidence of acute intracranial hemorrhage, no definite findings of acute/subacute cerebral ischemia, no mass, mass effect, hydrocephalus or abnormal intra or extra-axial fluid collections. Visualized paranasal sinuses and mastoids are well pneumatized. No acute displaced  skull fractures are identified.  IMPRESSION: * No acute intracranial abnormalities. *Mild cerebral atrophy with chronic microvascular ischemic changes in the cerebral white matter, as above.  CriticalValue/emergent results were called by telephone at the time of interpretation on 02/14/2013 at 10:35 PMto SCOTT GOLDSTON , who verbally acknowledged these results.   Electronically Signed   By: Trudie Reed M.D.   On: 02/14/2013 22:24    Scheduled Meds: . heparin  5,000 Units Subcutaneous Q8H  . [START ON 02/16/2013] influenza vac split quadrivalent PF  0.5 mL Intramuscular Tomorrow-1000  . LORazepam  1 mg Oral Once  . phenytoin (DILANTIN) IV  100 mg Intravenous Q8H  . [START ON 02/16/2013] pneumococcal 23 valent vaccine  0.5 mL Intramuscular Tomorrow-1000  . sodium chloride  3 mL Intravenous Q12H   Continuous Infusions:   Active Problems:   Seizure   Postictal state   Unspecified essential hypertension   CKD (chronic kidney disease)   Proteinuria   Michelle [name] [employer] Pager [phone]. If 7PM-7AM, please contact night-coverage at [email or web address], password [facility] 02/15/2013, 11:10 AM  LOS: 1 day

## 2013-02-16 DIAGNOSIS — B20 Human immunodeficiency virus [HIV] disease: Secondary | ICD-10-CM

## 2013-02-16 DIAGNOSIS — M109 Gout, unspecified: Secondary | ICD-10-CM | POA: Diagnosis present

## 2013-02-16 DIAGNOSIS — M62838 Other muscle spasm: Secondary | ICD-10-CM

## 2013-02-16 LAB — COMPREHENSIVE METABOLIC PANEL
ALT: 31 U/L (ref 0–35)
Albumin: 2.3 g/dL — ABNORMAL LOW (ref 3.5–5.2)
Alkaline Phosphatase: 160 U/L — ABNORMAL HIGH (ref 39–117)
Calcium: 8.3 mg/dL — ABNORMAL LOW (ref 8.4–10.5)
Potassium: 3.6 mEq/L (ref 3.5–5.1)
Sodium: 137 mEq/L (ref 135–145)
Total Protein: 6.8 g/dL (ref 6.0–8.3)

## 2013-02-16 LAB — GLUCOSE, CAPILLARY
Glucose-Capillary: 138 mg/dL — ABNORMAL HIGH (ref 70–99)
Glucose-Capillary: 139 mg/dL — ABNORMAL HIGH (ref 70–99)

## 2013-02-16 LAB — LIPID PANEL: LDL Cholesterol: 81 mg/dL (ref 0–99)

## 2013-02-16 MED ORDER — LEVETIRACETAM 750 MG PO TABS
1500.0000 mg | ORAL_TABLET | Freq: Two times a day (BID) | ORAL | Status: DC
  Administered 2013-02-16: 1500 mg via ORAL
  Filled 2013-02-16 (×2): qty 2

## 2013-02-16 MED ORDER — PANTOPRAZOLE SODIUM 40 MG PO TBEC
40.0000 mg | DELAYED_RELEASE_TABLET | Freq: Every day | ORAL | Status: DC
  Administered 2013-02-16: 40 mg via ORAL
  Filled 2013-02-16: qty 1

## 2013-02-16 MED ORDER — DILTIAZEM HCL ER 180 MG PO CP24
180.0000 mg | ORAL_CAPSULE | Freq: Every day | ORAL | Status: DC
  Administered 2013-02-16: 180 mg via ORAL
  Filled 2013-02-16: qty 1

## 2013-02-16 MED ORDER — POTASSIUM CHLORIDE CRYS ER 20 MEQ PO TBCR: 20.0000 meq | EXTENDED_RELEASE_TABLET | Freq: Every day | ORAL | Status: DC

## 2013-02-16 MED ORDER — LISINOPRIL 40 MG PO TABS
40.0000 mg | ORAL_TABLET | Freq: Every day | ORAL | Status: DC
  Administered 2013-02-16: 40 mg via ORAL
  Filled 2013-02-16: qty 1

## 2013-02-16 MED ORDER — EMTRICITABINE-TENOFOVIR DF 200-300 MG PO TABS
1.0000 | ORAL_TABLET | Freq: Every day | ORAL | Status: DC
  Administered 2013-02-16: 1 via ORAL
  Filled 2013-02-16: qty 1

## 2013-02-16 MED ORDER — METHOCARBAMOL 500 MG PO TABS
500.0000 mg | ORAL_TABLET | Freq: Three times a day (TID) | ORAL | Status: DC | PRN
  Administered 2013-02-16: 500 mg via ORAL
  Filled 2013-02-16: qty 1

## 2013-02-16 MED ORDER — PHENYTOIN SODIUM EXTENDED 100 MG PO CAPS: 100.0000 mg | ORAL_CAPSULE | Freq: Three times a day (TID) | ORAL | Status: DC

## 2013-02-16 MED ORDER — HYDRALAZINE HCL 20 MG/ML IJ SOLN
5.0000 mg | Freq: Four times a day (QID) | INTRAMUSCULAR | Status: DC | PRN
  Administered 2013-02-16 (×2): 5 mg via INTRAVENOUS
  Filled 2013-02-16 (×2): qty 1

## 2013-02-16 MED ORDER — METOPROLOL TARTRATE 50 MG PO TABS
50.0000 mg | ORAL_TABLET | Freq: Three times a day (TID) | ORAL | Status: DC
  Administered 2013-02-16 (×2): 50 mg via ORAL
  Filled 2013-02-16 (×3): qty 1

## 2013-02-16 MED ORDER — ACYCLOVIR 800 MG PO TABS
800.0000 mg | ORAL_TABLET | Freq: Every day | ORAL | Status: DC
  Administered 2013-02-16: 800 mg via ORAL
  Filled 2013-02-16: qty 1

## 2013-02-16 MED ORDER — ASPIRIN EC 81 MG PO TBEC
81.0000 mg | DELAYED_RELEASE_TABLET | Freq: Every day | ORAL | Status: DC
  Administered 2013-02-16: 81 mg via ORAL
  Filled 2013-02-16: qty 1

## 2013-02-16 MED ORDER — HYDROCODONE-ACETAMINOPHEN 5-325 MG PO TABS
1.0000 | ORAL_TABLET | Freq: Four times a day (QID) | ORAL | Status: DC | PRN
  Administered 2013-02-16 (×2): 1 via ORAL
  Filled 2013-02-16 (×2): qty 1

## 2013-02-16 MED ORDER — LOPINAVIR-RITONAVIR 200-50 MG PO TABS
2.0000 | ORAL_TABLET | Freq: Two times a day (BID) | ORAL | Status: DC
  Administered 2013-02-16: 2 via ORAL
  Filled 2013-02-16 (×2): qty 2

## 2013-02-16 NOTE — Progress Notes (Signed)
Subjective: No recurrence of seizure activity reported. Patient's mental status as improved. She had no new complaints. It was determined that patient had been taking Keppra 1500 mg twice a day prior to admission and has been compliant with taking her medications.  Objective: Current vital signs: BP 184/82  Pulse 64  Temp(Src) 97.9 F (36.6 C) (Oral)  Resp 18  Ht 5\' 6"  (1.676 m)  Wt 87.635 kg (193 lb 3.2 oz)  BMI 31.2 kg/m2  SpO2 100%  Neurologic Exam: Patient was alert and in no acute distress. She was well oriented to time as well as place. Mental status is normal. Speech was normal. Patient moved extremities equally with normal strength throughout.  Medications: I have reviewed the patient's current medications.  Assessment/Plan: Recurrent breakthrough generalized seizures, controlled with Dilantin 100 mg 3 times a day and Keppra 1500 mg twice a day. Patient appears to be tolerating both medications well.  No further neurodiagnostic studies are indicated. Recommend continuing current doses of Dilantin and Keppra.  Patient is stable and can be discharged from the hospital at this point, from a neurological standpoint. She will need outpatient neurology followup. Recommend followup with [facility] or [facility].  C.R. Roseanne Reno, MD [facility] [phone]  02/16/2013  10:05 AM

## 2013-02-16 NOTE — Progress Notes (Signed)
Spoke with pharmacist that patient had brought home meds in, and that her PTA medications had not yet been completed. Pharmacist instructed to leave medications out of reach of patient and that the pharmacy tech would come and go over her medications in the morning.

## 2013-02-16 NOTE — Discharge Instructions (Signed)
Arterial Hypertension °Arterial hypertension (high blood pressure) is a condition of elevated pressure in your blood vessels. Hypertension over a long period of time is a risk factor for strokes, heart attacks, and heart failure. It is also the leading cause of kidney (renal) failure.  °CAUSES  °· In Adults -- Over 90% of all hypertension has no known cause. This is called essential or primary hypertension. In the other 10% of people with hypertension, the increase in blood pressure is caused by another disorder. This is called secondary hypertension. Important causes of secondary hypertension are: °· Heavy alcohol use. °· Obstructive sleep apnea. °· Hyperaldosterosim (Conn's syndrome). °· Steroid use. °· Chronic kidney failure. °· Hyperparathyroidism. °· Medications. °· Renal artery stenosis. °· Pheochromocytoma. °· Cushing's disease. °· Coarctation of the aorta. °· Scleroderma renal crisis. °· Licorice (in excessive amounts). °· Drugs (cocaine, methamphetamine). °Your caregiver can explain any items above that apply to you. °· In Children -- Secondary hypertension is more common and should always be considered. °· Pregnancy -- Few women of childbearing age have high blood pressure. However, up to 10% of them develop hypertension of pregnancy. Generally, this will not harm the woman. It may be a sign of 3 complications of pregnancy: preeclampsia, HELLP syndrome, and eclampsia. Follow up and control with medication is necessary. °SYMPTOMS  °· This condition normally does not produce any noticeable symptoms. It is usually found during a routine exam. °· Malignant hypertension is a late problem of high blood pressure. It may have the following symptoms: °· Headaches. °· Blurred vision. °· End-organ damage (this means your kidneys, heart, lungs, and other organs are being damaged). °· Stressful situations can increase the blood pressure. If a person with normal blood pressure has their blood pressure go up while being  seen by their caregiver, this is often termed "white coat hypertension." Its importance is not known. It may be related with eventually developing hypertension or complications of hypertension. °· Hypertension is often confused with mental tension, stress, and anxiety. °DIAGNOSIS  °The diagnosis is made by 3 separate blood pressure measurements. They are taken at least 1 week apart from each other. If there is organ damage from hypertension, the diagnosis may be made without repeat measurements. °Hypertension is usually identified by having blood pressure readings: °· Above 140/90 mmHg measured in both arms, at 3 separate times, over a couple weeks. °· Over 130/80 mmHg should be considered a risk factor and may require treatment in patients with diabetes. °Blood pressure readings over 120/80 mmHg are called "pre-hypertension" even in non-diabetic patients. °To get a true blood pressure measurement, use the following guidelines. Be aware of the factors that can alter blood pressure readings. °· Take measurements at least 1 hour after caffeine. °· Take measurements 30 minutes after smoking and without any stress. This is another reason to quit smoking  it raises your blood pressure. °· Use a proper cuff size. Ask your caregiver if you are not sure about your cuff size. °· Most home blood pressure cuffs are automatic. They will measure systolic and diastolic pressures. The systolic pressure is the pressure reading at the start of sounds. Diastolic pressure is the pressure at which the sounds disappear. If you are elderly, measure pressures in multiple postures. Try sitting, lying or standing. °· Sit at rest for a minimum of 5 minutes before taking measurements. °· You should not be on any medications like decongestants. These are found in many cold medications. °· Record your blood pressure readings and review   them with your caregiver. °If you have hypertension: °· Your caregiver may do tests to be sure you do not have  secondary hypertension (see "causes" above). °· Your caregiver may also look for signs of metabolic syndrome. This is also called Syndrome X or Insulin Resistance Syndrome. You may have this syndrome if you have type 2 diabetes, abdominal obesity, and abnormal blood lipids in addition to hypertension. °· Your caregiver will take your medical and family history and perform a physical exam. °· Diagnostic tests may include blood tests (for glucose, cholesterol, potassium, and kidney function), a urinalysis, or an EKG. Other tests may also be necessary depending on your condition. °PREVENTION  °There are important lifestyle issues that you can adopt to reduce your chance of developing hypertension: °· Maintain a normal weight. °· Limit the amount of salt (sodium) in your diet. °· Exercise often. °· Limit alcohol intake. °· Get enough potassium in your diet. Discuss specific advice with your caregiver. °· Follow a DASH diet (dietary approaches to stop hypertension). This diet is rich in fruits, vegetables, and low-fat dairy products, and avoids certain fats. °PROGNOSIS  °Essential hypertension cannot be cured. Lifestyle changes and medical treatment can lower blood pressure and reduce complications. The prognosis of secondary hypertension depends on the underlying cause. Many people whose hypertension is controlled with medicine or lifestyle changes can live a normal, healthy life.  °RISKS AND COMPLICATIONS  °While high blood pressure alone is not an illness, it often requires treatment due to its short- and long-term effects on many organs. Hypertension increases your risk for: °· CVAs or strokes (cerebrovascular accident). °· Heart failure due to chronically high blood pressure (hypertensive cardiomyopathy). °· Heart attack (myocardial infarction). °· Damage to the retina (hypertensive retinopathy). °· Kidney failure (hypertensive nephropathy). °Your caregiver can explain list items above that apply to you. Treatment  of hypertension can significantly reduce the risk of complications. °TREATMENT  °· For overweight patients, weight loss and regular exercise are recommended. Physical fitness lowers blood pressure. °· Mild hypertension is usually treated with diet and exercise. A diet rich in fruits and vegetables, fat-free dairy products, and foods low in fat and salt (sodium) can help lower blood pressure. Decreasing salt intake decreases blood pressure in a 1/3 of people. °· Stop smoking if you are a smoker. °The steps above are highly effective in reducing blood pressure. While these actions are easy to suggest, they are difficult to achieve. Most patients with moderate or severe hypertension end up requiring medications to bring their blood pressure down to a normal level. There are several classes of medications for treatment. Blood pressure pills (antihypertensives) will lower blood pressure by their different actions. Lowering the blood pressure by 10 mmHg may decrease the risk of complications by as much as 25%. °The goal of treatment is effective blood pressure control. This will reduce your risk for complications. Your caregiver will help you determine the best treatment for you according to your lifestyle. What is excellent treatment for one person, may not be for you. °HOME CARE INSTRUCTIONS  °· Do not smoke. °· Follow the lifestyle changes outlined in the "Prevention" section. °· If you are on medications, follow the directions carefully. Blood pressure medications must be taken as prescribed. Skipping doses reduces their benefit. It also puts you at risk for problems. °· Follow up with your caregiver, as directed. °· If you are asked to monitor your blood pressure at home, follow the guidelines in the "Diagnosis" section above. °SEEK MEDICAL CARE   IF:   You think you are having medication side effects.  You have recurrent headaches or lightheadedness.  You have swelling in your ankles.  You have trouble with  your vision. SEEK IMMEDIATE MEDICAL CARE IF:   You have sudden onset of chest pain or pressure, difficulty breathing, or other symptoms of a heart attack.  You have a severe headache.  You have symptoms of a stroke (such as sudden weakness, difficulty speaking, difficulty walking). MAKE SURE YOU:   Understand these instructions.  Will watch your condition.  Will get help right away if you are not doing well or get worse. Document Released: 05/16/2005 Document Revised: 08/08/2011 Document Reviewed: 12/14/2006 [facility] Patient Information 2014 [place].  Chronic Kidney Disease Chronic kidney disease occurs when the kidneys are damaged over a long period. The kidneys are two organs that lie on either side of the spine between the middle of the back and the front of the abdomen. The kidneys:   Remove wastes and extra water from the blood.   Produce important hormones. These help keep bones strong, regulate blood pressure, and help create red blood cells.   Balance the fluids and chemicals in the blood and tissues. A small amount of kidney damage may not cause problems, but a large amount of damage may make it difficult or impossible for the kidneys to work the way they should. If steps are not taken to slow down the kidney damage or stop it from getting worse, the kidneys may stop working permanently. Most of the time, chronic kidney disease does not go away. However, it can often be controlled, and those with the disease can usually live normal lives. CAUSES  The most common causes of chronic kidney disease are diabetes and high blood pressure (hypertension). Chronic kidney disease may also be caused by:   Diseases that cause kidneys' filters to become inflamed.   Diseases that affect the immune system.   Genetic diseases.   Medicines that damage the kidneys, such as anti-inflammatory medicines.  Poisoning or exposure to toxic substances.   A reoccurring kidney or  urinary infection.   A problem with urine flow. This may be caused by:   Cancer.   Kidney stones.   An enlarged prostate in males. SYMPTOMS  Because the kidney damage in chronic kidney disease occurs slowly, symptoms develop slowly and may not be obvious until the kidney damage becomes severe. A person may have a kidney disease for years without showing any symptoms. Symptoms can include:   Swelling (edema) of the legs, ankles, or feet.   Tiredness (lethargy).   Nausea or vomiting.   Confusion.   Problems with urination, such as:   Decreased urine production.   Frequent urination, especially at night.   Frequent accidents in children who are potty trained.   Muscle twitches and cramps.   Shortness of breath.  Weakness.   Persistent itchiness.   Loss of appetite.  Metallic taste in the mouth.  Trouble sleeping.  Slowed development in children.  Short stature in children. DIAGNOSIS  Chronic kidney disease may be detected and diagnosed by tests, including blood, urine, imaging, or kidney biopsy tests.  TREATMENT  Most chronic kidney diseases cannot be cured. Treatment usually involves relieving symptoms and preventing or slowing the progression of the disease. Treatment may include:   A special diet. You may need to avoid alcohol and foods thatare salty and high in potassium.   Medicines. These may:   Lower blood pressure.  Relieve anemia.   Relieve swelling.   Protect the bones. HOME CARE INSTRUCTIONS   Follow your prescribed diet.   Only take over-the-counter or prescription medicines as directed by your caregiver.  Do not take any new medicines (prescription, over-the-counter, or nutritional supplements) unless approved by your caregiver. Many medicines can worsen your kidney damage or need to have the dose adjusted.   Quit smoking if you are a smoker. Talk to your caregiver about a smoking cessation program.   Keep all  follow-up appointments as directed by your caregiver. SEEK IMMEDIATE MEDICAL CARE IF:  Your symptoms get worse or you develop new symptoms.   You develop symptoms of end-stage kidney disease. These include:   Headaches.   Abnormally dark or light skin.   Numbness in the hands or feet.   Easy bruising.   Frequent hiccups.   Menstruation stops.   You have a fever.   You have decreased urine production.   You havepain or bleeding when urinating. MAKE SURE YOU:  Understand these instructions.  Will watch your condition.  Will get help right away if you are not doing well or get worse. FOR MORE INFORMATION  American Association of Kidney Patients: ResidentialShow.is National Kidney Foundation: www.kidney.org American Kidney Fund: FightingMatch.com.ee [facility]: www.lifeoptions.org and www.kidneyschool.org Document Released: 02/23/2008 Document Revised: 05/02/2012 Document Reviewed: 01/13/2012 [facility] Patient Information 2014 [employer], [place].  DASH Diet The DASH diet stands for "Dietary Approaches to Stop Hypertension." It is a healthy eating plan that has been shown to reduce high blood pressure (hypertension) in as little as 14 days, while also possibly providing other significant health benefits. These other health benefits include reducing the risk of breast cancer after menopause and reducing the risk of type 2 diabetes, heart disease, colon cancer, and stroke. Health benefits also include weight loss and slowing kidney failure in patients with chronic kidney disease.  DIET GUIDELINES  Limit salt (sodium). Your diet should contain less than 1500 mg of sodium daily.  Limit refined or processed carbohydrates. Your diet should include mostly whole grains. Desserts and added sugars should be used sparingly.  Include small amounts of heart-healthy fats. These types of fats include nuts, oils, and tub margarine. Limit saturated and trans fats. These  fats have been shown to be harmful in the body. CHOOSING FOODS  The following food groups are based on a 2000 calorie diet. See your Registered Dietitian for individual calorie needs. Grains and Grain Products (6 to 8 servings daily)  Eat More Often: Whole-wheat bread, brown rice, whole-grain or wheat pasta, quinoa, popcorn without added fat or salt (air popped).  Eat Less Often: White bread, white pasta, white rice, cornbread. Vegetables (4 to 5 servings daily)  Eat More Often: Fresh, frozen, and canned vegetables. Vegetables may be raw, steamed, roasted, or grilled with a minimal amount of fat.  Eat Less Often/Avoid: Creamed or fried vegetables. Vegetables in a cheese sauce. Fruit (4 to 5 servings daily)  Eat More Often: All fresh, canned (in natural juice), or frozen fruits. Dried fruits without added sugar. One hundred percent fruit juice ( cup [237 mL] daily).  Eat Less Often: Dried fruits with added sugar. Canned fruit in light or heavy syrup. [employer], Fish, and Poultry (2 servings or less daily. One serving is 3 to 4 oz [85-114 g]).  Eat More Often: Ninety percent or leaner ground beef, tenderloin, sirloin. Round cuts of beef, chicken breast, [place] breast. All fish. Grill, bake, or broil your meat. Nothing  should be fried.  Eat Less Often/Avoid: Fatty cuts of meat, [place], or chicken leg, thigh, or wing. Fried cuts of meat or fish. Dairy (2 to 3 servings)  Eat More Often: Low-fat or fat-free milk, low-fat plain or light yogurt, reduced-fat or part-skim cheese.  Eat Less Often/Avoid: Milk (whole, 2%).Whole milk yogurt. Full-fat cheeses. Nuts, Seeds, and Legumes (4 to 5 servings per week)  Eat More Often: All without added salt.  Eat Less Often/Avoid: Salted nuts and seeds, canned beans with added salt. Fats and Sweets (limited)  Eat More Often: Vegetable oils, tub margarines without trans fats, sugar-free gelatin. Mayonnaise and salad dressings.  Eat Less Often/Avoid:  Coconut oils, palm oils, butter, stick margarine, cream, half and half, cookies, candy, pie. FOR MORE INFORMATION The Dash Diet Eating Plan: www.dashdiet.org Document Released: 05/05/2011 Document Revised: 08/08/2011 Document Reviewed: 05/05/2011 [facility] Patient Information 2014 [place], [place].  Epilepsy A seizure (convulsion) is a sudden change in brain function that causes a change in behavior, muscle activity, or ability to remain awake and alert. If a person has recurring seizures, this is called epilepsy. CAUSES  Epilepsy is a disorder with many possible causes. Anything that disturbs the normal pattern of brain cell activity can lead to seizures. Seizure can be caused from illness to brain damage to abnormal brain development. Epilepsy may develop because of:  An abnormality in brain wiring.  An imbalance of nerve signaling chemicals (neurotransmitters).  Some combination of these factors. Scientists are learning an increasing amount about genetic causes of seizures. SYMPTOMS  The symptoms of a seizure can vary greatly from one person to another. These may include:  An aura, or warning that tells a person they are about to have a seizure.  Abnormal sensations, such as abnormal smell or seeing flashing lights.  Sudden, general body stiffness.  Rhythmic jerking of the face, arm, or leg  on one or both sides.  Sudden change in consciousness.  The person may appear to be awake but not responding.  They may appear to be asleep but cannot be awakened.  Grimacing, chewing, lip smacking, or drooling.  Often there is a period of sleepiness after a seizure. DIAGNOSIS  The description you give to your caregiver about what you experienced will help them understand your problems. Equally important is the description by any witnesses to your seizure. A physical exam, including a detailed neurological exam, is necessary. An EEG (electroencephalogram) is a painless test of your brain  waves. In this test a diagram is created of your brain waves. These diagrams can be interpreted by a specialist. Pictures of your brain are usually taken with:  An MRI.  A CT scan. Lab tests may be done to look for:  Signs of infection.  Abnormal blood chemistry. PREVENTION  There is no way to prevent the development of epilepsy. If you have seizures that are typically triggered by an event (such as flashing lights), try to avoid the trigger. This can help you avoid a seizure.  PROGNOSIS  Most people with epilepsy lead outwardly normal lives. While epilepsy cannot currently be cured, for some people it does eventually go away. Most seizures do not cause brain damage. It is not uncommon for people with epilepsy, especially children, to develop behavioral and emotional problems. These problems are sometimes the consequence of medicine for seizures or social stress. For some people with epilepsy, the risk of seizures restricts their independence and recreational activities. For example, some states refuse drivers licenses to people with epilepsy.  Most women with epilepsy can become pregnant. They should discuss their epilepsy and the medicine they are taking with their caregivers. Women with epilepsy have a 90 percent or better chance of having a normal, healthy baby. RISKS AND COMPLICATIONS  People with epilepsy are at increased risk of falls, accidents, and injuries. People with epilepsy are at special risk for two life-threatening conditions. These are status epilepticus and sudden unexplained death (extremely rare). Status epilepticus is a long lasting, continuous seizure that is a medical emergency. TREATMENT  Once epilepsy is diagnosed, it is important to begin treatment as soon as possible. For about 80 percent of those diagnosed with epilepsy, seizures can be controlled with modern medicines and surgical techniques. Some antiepileptic drugs can interfere with the effectiveness of oral  contraceptives. In 1997, the FDA approved a pacemaker for the brain the (vagus nerve stimulator). This stimulator can be used for people with seizures that are not well-controlled by medicine. Studies have shown that in some cases, children may experience fewer seizures if they maintain a strict diet. The strict diet is called the ketogenic diet. This diet is rich in fats and low in carbohydrates. HOME CARE INSTRUCTIONS   Your caregiver will make recommendations about driving and safety in normal activities. Follow these carefully.  Take any medicine prescribed exactly as directed.  Do any blood tests requested to monitor the levels of your medicine.  The people you live and work with should know that you are prone to seizures. They should receive instructions on how to help you. In general, a witness to a seizure should:  Cushion your head and body.  Turn you on your side.  Avoid unnecessarily restraining you.  Not place anything inside your mouth.  Call for local emergency medical help if there is any question about what has occurred.  Keep a seizure diary. Record what you recall about any seizure, especially any possible trigger.  If your caregiver has given you a follow-up appointment, it is very important to keep that appointment. Not keeping the appointment could result in permanent injury and disability. If there is any problem keeping the appointment, you must call back to this facility for assistance. SEEK MEDICAL CARE IF:   You develop signs of infection or other illness. This might increase the risk of a seizure.  You seem to be having more frequent seizures.  Your seizure pattern is changing. SEEK IMMEDIATE MEDICAL CARE IF:   A seizure does not stop after a few moments.  A seizure causes any difficulty in breathing.  A seizure results in a very severe headache.  A seizure leaves you with the inability to speak or use a part of your body. MAKE SURE YOU:    Understand these instructions.  Will watch your condition.  Will get help right away if you are not doing well or get worse. Document Released: 05/16/2005 Document Revised: 08/08/2011 Document Reviewed: 12/21/2007 [facility] Patient Information 2014 [employer], [place].  Epilepsy People with epilepsy have times when they shake and jerk uncontrollably (seizures). This happens when there is a sudden change in brain function. Epilepsy may have many possible causes. Anything that disturbs the normal pattern of brain cell activity can lead to seizures. HOME CARE   Listen to your doctor about driving and safety during normal activities.  Only take medicine as told by your doctor.  Take blood tests as told by your doctor.  Tell the people you live and work with that you have seizures. Make sure they  know how to help you. They should:  Cushion your head and body.  Turn you on your side.  Not restrain you.  Not place anything inside your mouth.  Call for local emergency medical help if there is any question about what has happened.  Write down when your seizures happen and what you remember about each seizure. Write down anything you think may have caused the seizure to happen (trigger).  Keep all follow-up visits with your doctor. This is very important. GET HELP RIGHT AWAY IF:   You get an infection or start to feel sick. You may have more seizures when you are sick.  You are having seizures more often.  Your seizure pattern is changing.  A seizure does not stop after a few seconds or minutes.  A seizure causes you to have trouble breathing.  A seizure gives you a very bad headache.  A seizure makes you unable to speak or use a part of your body. MAKE SURE YOU:   Understand these instructions.  Will watch your condition.  Will get help right away if you are not doing well or get worse. Document Released: 03/13/2009 Document Revised: 08/08/2011 Document Reviewed:  03/13/2009 [facility] Patient Information 2014 [place], [place].  Managing Your High Blood Pressure Blood pressure is a measurement of how forceful your blood is pressing against the walls of the arteries. Arteries are muscular tubes within the circulatory system. Blood pressure does not stay the same. Blood pressure rises when you are active, excited, or nervous; and it lowers during sleep and relaxation. If the numbers measuring your blood pressure stay above normal most of the time, you are at risk for health problems. High blood pressure (hypertension) is a long-term (chronic) condition in which blood pressure is elevated. A blood pressure reading is recorded as two numbers, such as 120 over 80 (or 120/80). The first, higher number is called the systolic pressure. It is a measure of the pressure in your arteries as the heart beats. The second, lower number is called the diastolic pressure. It is a measure of the pressure in your arteries as the heart relaxes between beats.  Keeping your blood pressure in a normal range is important to your overall health and prevention of health problems, such as heart disease and stroke. When your blood pressure is uncontrolled, your heart has to work harder than normal. High blood pressure is a very common condition in adults because blood pressure tends to rise with age. Men and women are equally likely to have hypertension but at different times in life. Before age 95, men are more likely to have hypertension. After 54 years of age, women are more likely to have it. Hypertension is especially common in African Americans. This condition often has no signs or symptoms. The cause of the condition is usually not known. Your caregiver can help you come up with a plan to keep your blood pressure in a normal, healthy range. BLOOD PRESSURE STAGES Blood pressure is classified into four stages: normal, prehypertension, stage 1, and stage 2. Your blood pressure reading will be  used to determine what type of treatment, if any, is necessary. Appropriate treatment options are tied to these four stages:  Normal  Systolic pressure (mm Hg): below 120.  Diastolic pressure (mm Hg): below 80. Prehypertension  Systolic pressure (mm Hg): 120 to 139.  Diastolic pressure (mm Hg): 80 to 89. Stage1  Systolic pressure (mm Hg): 140 to 159.  Diastolic pressure (mm Hg): 90 to  99. Stage2  Systolic pressure (mm Hg): 160 or above.  Diastolic pressure (mm Hg): 100 or above. RISKS RELATED TO HIGH BLOOD PRESSURE Managing your blood pressure is an important responsibility. Uncontrolled high blood pressure can lead to:  A heart attack.  A stroke.  A weakened blood vessel (aneurysm).  Heart failure.  Kidney damage.  Eye damage.  Metabolic syndrome.  Memory and concentration problems. HOW TO MANAGE YOUR BLOOD PRESSURE Blood pressure can be managed effectively with lifestyle changes and medicines (if needed). Your caregiver will help you come up with a plan to bring your blood pressure within a normal range. Your plan should include the following: Education  Read all information provided by your caregivers about how to control blood pressure.  Educate yourself on the latest guidelines and treatment recommendations. New research is always being done to further define the risks and treatments for high blood pressure. Lifestylechanges  Control your weight.  Avoid smoking.  Stay physically active.  Reduce the amount of salt in your diet.  Reduce stress.  Control any chronic conditions, such as high cholesterol or diabetes.  Reduce your alcohol intake. Medicines  Several medicines (antihypertensive medicines) are available, if needed, to bring blood pressure within a normal range. Communication  Review all the medicines you take with your caregiver because there may be side effects or interactions.  Talk with your caregiver about your diet, exercise  habits, and other lifestyle factors that may be contributing to high blood pressure.  See your caregiver regularly. Your caregiver can help you create and adjust your plan for managing high blood pressure. RECOMMENDATIONS FOR TREATMENT AND FOLLOW-UP  The following recommendations are based on current guidelines for managing high blood pressure in nonpregnant adults. Use these recommendations to identify the proper follow-up period or treatment option based on your blood pressure reading. You can discuss these options with your caregiver.  Systolic pressure of 120 to 139 or diastolic pressure of 80 to 89: Follow up with your caregiver as directed.  Systolic pressure of 140 to 160 or diastolic pressure of 90 to 100: Follow up with your caregiver within 2 months.  Systolic pressure above 160 or diastolic pressure above 100: Follow up with your caregiver within 1 month.  Systolic pressure above 180 or diastolic pressure above 110: Consider antihypertensive therapy; follow up with your caregiver within 1 week.  Systolic pressure above 200 or diastolic pressure above 120: Begin antihypertensive therapy; follow up with your caregiver within 1 week. Document Released: 02/08/2012 Document Reviewed: 02/08/2012 [facility] Patient Information 2014 [place], [place].  Seizure, Adult A seizure means there is unusual activity in the brain. A seizure can cause changes in attention or behavior. Seizures often cause shaking (convulsions). Seizures often last from 30 seconds to 2 minutes. HOME CARE   If you are given medicines, take them exactly as told by your doctor.  Keep all doctor visits as told.  Do not swim or drive until your doctor says it is okay.  Teach others what to do if you have a seizure. They should:  Lay you on the ground.  Put a cushion under your head.  Loosen any tight clothing around your neck.  Turn you on your side.  Stay with you until you get better. GET HELP RIGHT AWAY IF:    The seizure lasts longer than 2 to 5 minutes.  The seizure is very bad.  The person does not wake up after the seizure.  The person's attention or behavior changes. Drive  the person to the emergency room or call your local emergency services (911 in [place] MAKE SURE YOU:   Understand these instructions.  Will watch your condition.  Will get help right away if you are not doing well or get worse. Document Released: 11/02/2007 Document Revised: 08/08/2011 Document Reviewed: 05/04/2011 [facility] Patient Information 2014 [place], [place].

## 2013-02-16 NOTE — Progress Notes (Signed)
Pt had elevated BP of 188/94 on the dinamap. RN checked BP manually and BP was 180/78. Notified NP. Hydralazine PRN ordered. Medication effective. BP 160/66. Will continue to monitor. Dhruvi Crenshaw R

## 2013-02-16 NOTE — Discharge Summary (Signed)
Physician Discharge Summary  Michelle [name] NWG:[id number] DOB: 02/27/1961 DOA: 02/14/2013  PCP: No primary provider on file.  Admit date: 02/14/2013 Discharge date: 02/16/2013   Recommendations for Outpatient Follow-up:  1. Establish with neurologist ASAP 2. Check dilantin level 3. Check BMP  4. Establish care with ID 5. Monitor BP and adjust meds as needed  Discharge Diagnoses:     Seizure   Postictal state -resolved   Unspecified essential hypertension   CKD (chronic kidney disease)   Proteinuria   HIV disease   Gout   Muscle spasm  Discharge Condition: Stable   Diet recommendation: renal, heart healthy  Filed Weights   02/15/13 0246  Weight: 87.635 kg (193 lb 3.2 oz)    History of present illness:  Michelle [name] is a 52 y.o. 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 Per family, she has a history of recurrent seizures and often has had unilateral weakness after seizures in the past. On arrival to the ED she was noted to have rhythmic RUE shaking treated with ativan which cased the shaking to stop. Speech remained impaired. She was treated with a total of 5mg  of ativan.  Hospital Course:  Seizure - patient with previous history of stroke like symptoms following seizure in the past who apparently was having seizure on arrival. She has had some improvement in mental status since arrival but was postictal for some time.  She had an EEG that was consistent with her being post-ictal.  Neuro placed her back on keppra at her home doses and dilantin 100 mg po TID. Neurology has seen patient, recommended discharege and they recommend outpatient neurology followup with [facility] or [facility] and that patient continue dilantin  100 mg po TID and keppra 1500 mg po BID  Hypertension- suboptimally controlled.  Pt is being restarted on her home medications for blood pressure.  Home BP monitoring recommended.  Pt needs close outpatient PCP followup.   CKD - creatinine improved some with IVFs.  Recheck BMP with PCP in 1 week.  Consider outpatient nephrology appointment.   042 - Resuming home medications and pt advised to establish care with local ID physician.   Procedures:  EEG  Consultations:  Neurology  Discharge Exam: No further seizure activity.  Pt without complaints.  Her family brought in all of her home meds to be reconciled today.  She reported that she has had HIV for over 30 years. She is planning to relocate here permanently  Filed Vitals:   02/16/13 0537  BP: 184/82  Pulse: 64  Temp: 97.9 F (36.6 C)  Resp: 18   General: NAD, resting comfortably in bed, will wake up to light touch,  Eyes: PEERLA EOMI  ENT: mucous membranes moist  Neck: supple w/o JVD  Cardiovascular: RRR w/o MRG  Respiratory: CTA B  Abdomen: soft, nt, nd, bs+  Skin: no rash nor lesion  Musculoskeletal: full ROM all 4 extremities  Psychiatric: normal affect noted.  Neurologic: commands, nonfocal   Discharge Instructions  Discharge Orders   Future Orders Complete By Expires   Diet - low sodium heart healthy  As directed    Discharge instructions  As directed    Comments:     Follow up with neurologist in 2 weeks. Call  and make appointment with [facility] or [facility] Establish primary care physician.  Call [facility] and [facility] [phone] for appointment Return if symptoms recur, worsen or new problems develop. Take your medications as prescribed.   Discontinue IV  As directed    Driving Restrictions  As directed    Comments:     No driving or operating machinery.   Increase activity slowly  As directed        Medication List    STOP taking these medications       BC HEADACHE  POWDER PO      TAKE these medications       acyclovir 800 MG tablet  Commonly known as:  ZOVIRAX  Take 800 mg by mouth daily.     ANUSOL-HC RE  Place 1 application rectally daily as needed (for hemorrhoids).     aspirin EC 81 MG tablet  Take 81 mg by mouth daily.     diltiazem 180 MG 24 hr capsule  Commonly known as:  DILACOR XR  Take 180 mg by mouth daily.     emtricitabine-tenofovir 200-300 MG per tablet  Commonly known as:  TRUVADA  Take 1 tablet by mouth daily.     furosemide 40 MG tablet  Commonly known as:  LASIX  Take 40 mg by mouth daily as needed for fluid.     HYDROcodone-acetaminophen 5-325 MG per tablet  Commonly known as:  NORCO/VICODIN  Take 1 tablet by mouth every 6 (six) hours as needed for pain.     insulin glargine 100 UNIT/ML injection  Commonly known as:  LANTUS  Inject 30 Units into the skin at bedtime.     levETIRAcetam 500 MG tablet  Commonly known as:  KEPPRA  Take 1,500 mg by mouth every 12 (twelve) hours.     lisinopril 40 MG tablet  Commonly known as:  PRINIVIL,ZESTRIL  Take 40 mg by mouth daily.     lopinavir-ritonavir 200-50 MG per tablet  Commonly known as:  KALETRA  Take 2 tablets by mouth 2 (two) times daily.     metoprolol 50 MG tablet  Commonly known as:  LOPRESSOR  Take 50 mg by mouth 2 (two) times daily.     pantoprazole 40 MG tablet  Commonly known as:  PROTONIX  Take 40 mg by mouth daily.     phenytoin 100 MG ER capsule  Commonly known as:  DILANTIN  Take 1 capsule (100 mg total) by mouth 3 (three) times daily.     potassium chloride SA 20 MEQ tablet  Commonly known as:  K-DUR,KLOR-CON  Take 1 tablet (20 mEq total) by mouth daily.     triamcinolone cream 0.1 %  Commonly known as:  KENALOG  Apply 1 application topically 2 (two) times daily.       Allergies  Allergen Reactions  . Morphine And Related Hives, Itching and Rash       Follow-up Information   Follow up with [facility].  Schedule an appointment as soon as possible for a visit in 2 weeks. (Establish Care Follow up seizures)    Contact information:   [address] [place] [address] [phone]      Follow up with [facility]    . Schedule an appointment as soon as possible for a visit in 1 week. (Establish primary care )    Contact information:   [address] [place] [address]  The results of significant diagnostics from this hospitalization (including imaging, microbiology, ancillary and laboratory) are listed below for reference.    Significant Diagnostic Studies: Ct Head Wo Contrast  02/14/2013   CLINICAL DATA:  Right-sided weakness. Code stroke.  EXAM: CT HEAD WITHOUT CONTRAST  TECHNIQUE: Contiguous axial images were obtained from the base of the skull through the vertex without intravenous contrast.  COMPARISON:  Head CT 02/23/2003.  Brain MRI 04/19/2003.  FINDINGS: Mild cerebral atrophy. Patchy and confluent areas of decreased attenuation are noted throughout the deep and periventricular white matter of the cerebral hemispheres bilaterally, compatible with chronic microvascular ischemic disease. No acute intracranial abnormalities. Specifically, no evidence of acute intracranial hemorrhage, no definite findings of acute/subacute cerebral ischemia, no mass, mass effect, hydrocephalus or abnormal intra or extra-axial fluid collections. Visualized paranasal sinuses and mastoids are well pneumatized. No acute displaced skull fractures are identified.  IMPRESSION: * No acute intracranial abnormalities. *Mild cerebral atrophy with chronic microvascular ischemic changes in the cerebral white matter, as above.  CriticalValue/emergent results were called by telephone at the time of interpretation on 02/14/2013 at 10:35 PMto SCOTT GOLDSTON , who verbally acknowledged these results.   Electronically Signed   By: Trudie Reed M.D.   On: 02/14/2013 22:24    Microbiology: No results found for this or any previous visit (from the past 240 hour(s)).   Labs: Basic Metabolic Panel:  Recent Labs Lab 02/14/13 2215 02/14/13 2219 02/16/13 0610  NA 138 143 137  K 3.4* 3.5 3.6  CL 106 114* 104  CO2 18*  --  22  GLUCOSE 128* 132* 143*  BUN 41* 39* 34*  CREATININE 1.74* 1.90* 1.37*  CALCIUM 8.6  --  8.3*   Liver Function Tests:  Recent Labs Lab 02/14/13 2215 02/16/13 0610  AST 48* 37  ALT 33 31  ALKPHOS 212* 160*  BILITOT 0.2* 0.1*  PROT 8.1 6.8  ALBUMIN 2.9* 2.3*   No results found for this basename: LIPASE, AMYLASE,  in the last 168 hours No results found for this basename: AMMONIA,  in the last 168 hours CBC:  Recent Labs Lab 02/14/13 2215 02/14/13 2219  WBC 4.3  --   NEUTROABS 1.6*  --   HGB 10.3* 10.5*  HCT 29.1* 31.0*  MCV 97.3  --   PLT 204  --    Cardiac Enzymes:  Recent Labs Lab 02/14/13 2215  TROPONINI <0.30   BNP: BNP (last 3 results) No results found for this basename: PROBNP,  in the last 8760 hours CBG:  Recent Labs Lab 02/15/13 1152 02/15/13 1636 02/15/13 2145 02/16/13 0643 02/16/13 1153  GLUCAP 81 176* 126* 138* 139*   34 mins preparing discharge, reviewing labs, procedures, reconciling meds, counseling patient, etc.   Signed:  Varsha Knock  [facility] 02/16/2013, 1:44 PM

## 2013-03-04 ENCOUNTER — Encounter: Payer: Self-pay | Admitting: Neurology

## 2013-03-04 ENCOUNTER — Ambulatory Visit (INDEPENDENT_AMBULATORY_CARE_PROVIDER_SITE_OTHER): Payer: Medicaid Other | Admitting: Neurology

## 2013-03-04 VITALS — BP 164/79 | HR 56 | Ht 65.5 in | Wt 182.0 lb

## 2013-03-04 DIAGNOSIS — R4189 Other symptoms and signs involving cognitive functions and awareness: Secondary | ICD-10-CM

## 2013-03-04 DIAGNOSIS — R569 Unspecified convulsions: Secondary | ICD-10-CM

## 2013-03-04 DIAGNOSIS — F09 Unspecified mental disorder due to known physiological condition: Secondary | ICD-10-CM

## 2013-03-04 NOTE — Progress Notes (Signed)
[facility]  Provider:  Dr Hosie Poisson Referring Provider: Cleora Fleet, MD Primary Care Physician:  No primary provider on file.  CC:  Seizure after hospital discharge  HPI:  Michelle [name] is a 52 y.o. female here as a referral from Dr. Laural Benes for seizure follow up. Patient presents alone and is unable to provide much history.   First started having seizures 67yrs ago, unsure why she started having seizures. At the time had no recent head trauma, no recent illness. Had workup done but no cause. She is HIV positive (has been + for 35years). She is unsure if seizures are well controlled. Had GTC seizure on Sept 18, discharged on the 20th. Patient is unsure if she has had any seizures since being discharged. Is unable to tell me when her last seizure was.   Patient had head CT done during hospital stay which was unremarkable. Had brain MRI done in 2004 which showed question of cryptococcal meningitis. Patient unsure if she has been treated for this. Patient is unsure who the doctor is that monitors her HIV. Currently takes Kaletra. Is unsure of viral load or CD4 count.   Patient reports she is currently moving to [facility] and needs to find a primary care doctor.   Review of Systems: Out of a complete 14 system review, the patient complains of only the following symptoms, and all other reviewed systems are negative. Other for fever chills fatigue blurred vision shortness of breath cough snoring feeling hot feeling cold memory loss confusion headache weakness slurred speech dizziness seizures snoring hallucinations runny nose rash History   Social History  . Marital Status: Single    Spouse Name: N/A    Number of Children: 4  . Years of Education: 11th   Occupational History  . Not on file.   Social History Main Topics  . Smoking status: Former Smoker    Types: Cigarettes  . Smokeless tobacco: Never Used  . Alcohol Use: No  . Drug Use: No  . Sexual Activity: Not  on file   Other Topics Concern  . Not on file   Social History Narrative   Patient lives at home with sister.    Patient is unemployed.    Patient is single.    Patient has 2 alive and 2 deceased.    Patient has 11th grade education.           Family History  Problem Relation Age of Onset  . Cancer - Colon Mother   . Cancer Father   . Diabetes    . Hypertension Father     Past Medical History  Diagnosis Date  . Seizures   . Stroke   . Meningitis   . Diabetes mellitus without complication     No past surgical history on file.  Current Outpatient Prescriptions  Medication Sig Dispense Refill  . acyclovir (ZOVIRAX) 800 MG tablet Take 800 mg by mouth daily.      [name] aspirin EC 81 MG tablet Take 81 mg by mouth daily.      [name] diltiazem (DILACOR XR) 180 MG 24 hr capsule Take 180 mg by mouth daily.      [name] emtricitabine-tenofovir (TRUVADA) 200-300 MG per tablet Take 1 tablet by mouth daily.      . furosemide (LASIX) 40 MG tablet Take 40 mg by mouth daily as needed for fluid.      [name] HYDROcodone-acetaminophen (NORCO/VICODIN) 5-325 MG per tablet Take 1 tablet by mouth every 6 (six)  hours as needed for pain.      [name] Hydrocortisone (ANUSOL-HC RE) Place 1 application rectally daily as needed (for hemorrhoids).      . insulin glargine (LANTUS) 100 UNIT/ML injection Inject 30 Units into the skin at bedtime.      . levETIRAcetam (KEPPRA) 500 MG tablet Take 1,500 mg by mouth every 12 (twelve) hours.      [name] lisinopril (PRINIVIL,ZESTRIL) 40 MG tablet Take 40 mg by mouth daily.      [name] lopinavir-ritonavir (KALETRA) 200-50 MG per tablet Take 2 tablets by mouth 2 (two) times daily.      . metoprolol (LOPRESSOR) 50 MG tablet Take 50 mg by mouth 2 (two) times daily.      . pantoprazole (PROTONIX) 40 MG tablet Take 40 mg by mouth daily.      . phenytoin (DILANTIN) 100 MG ER capsule Take 1 capsule (100 mg total) by mouth 3 (three) times daily.  90 capsule  0  . potassium chloride SA (K-DUR,KLOR-CON)  20 MEQ tablet Take 1 tablet (20 mEq total) by mouth daily.      [name] triamcinolone cream (KENALOG) 0.1 % Apply 1 application topically 2 (two) times daily.       No current facility-administered medications for this visit.    Allergies as of 03/04/2013 - Review Complete 03/04/2013  Allergen Reaction Noted  . Morphine and related Hives, Itching, and Rash 02/15/2013    Vitals: BP 164/79  Pulse 56  Ht 5' 5.5" (1.664 m)  Wt 182 lb (82.555 kg)  BMI 29.82 kg/m2 Last Weight:  Wt Readings from Last 1 Encounters:  03/04/13 182 lb (82.555 kg)   Last Height:   Ht Readings from Last 1 Encounters:  03/04/13 5' 5.5" (1.664 m)     Physical exam: Exam: Gen: NAD, conversant Eyes: anicteric sclerae, moist conjunctivae HENT: Atraumatic, oropharynx clear Neck: Trachea midline; supple,  Lungs: CTA, no wheezing, rales, rhonic                          CV: RRR, no MRG Abdomen: Soft, non-tender;  Extremities: No peripheral edema  Skin: Normal temperature, no rash, patient refused to allow me to examine feet 2/2 gout Psych: Appropriate affect, pleasant  Neuro: MS: alert, oriented to name, location, unable to follow multi-step commands  Speech: limited but fluent w/o paraphasic error   CN: PERRL, EOMI no nystagmus, no ptosis, sensation intact to LT V1-V3 bilat, face symmetric, no weakness, hearing grossly intact, palate elevates symmetrically, shoulder shrug 5/5 bilat,  tongue protrudes midline, no fasiculations noted.  Motor: normal bulk and tone Strength: 5/5  In bilateral upper extremities. Patient refused to move lower extremities as she states she is having "severe gout".    Reflexes: symmetrical bilat UE, did not check patellar or achilles per patient request  Sens: LT intact in all extremities  Gait: patient refusing to stand she reports 2/2 pain   Assessment:  After physical and neurologic examination, review of laboratory studies, imaging, neurophysiology testing and  pre-existing records, assessment will be reviewed on the problem list.  Plan:  Treatment plan and additional workup will be reviewed under Problem List.  1)Seizures 2)HIV  Ms Bradway is a 51y/o woman with complicated medical history including HIV and seizure disorder presenting for follow up after recent hospital stay for breakthrough seizure. The patient presents alone, and is a very poor historian. It is difficult to get a full detailed history from her. At this  time we'll continue her on Dilantin 300 mg daily and Keppra 1500 mg twice a day. Patient instructed to have family come with her to followup visit and bring old records. Patient was given referral for primary care physician, instructed to also find a doctor to manage her HIV. Unclear etiology of patient's difficulty walking. Doubt it is all attributed to gout flare but patient does not wish to have formal exam at this time.

## 2013-03-04 NOTE — Patient Instructions (Addendum)
Overall you are doing fairly well but I do want to suggest a few things today:   Remember to drink plenty of fluid, eat healthy meals and do not skip any meals. Try to eat protein with a every meal and eat a healthy snack such as fruit or nuts in between meals. Try to keep a regular sleep-wake schedule and try to exercise daily, particularly in the form of walking, 20-30 minutes a day, if you can.   As far as your medications are concerned, I would like to suggest continuing the dilantin and keppra at their current dose and schedule.  As far as diagnostic testing: I would like to check the dilantin level and also check levels of vitamins  I would like to see you back in 6 months, sooner if we need to. Please call us with any interim questions, concerns, problems, updates or refill requests.   Please take a daily Vitamin D supplement. You can buy this from a [facility] or [facility] pharmacy.  Please follow up with a primary care physician. We placed a request for this.   Please also call us for any test results so we can go over those with you on the phone.  My clinical assistant and will answer any of your questions and relay your messages to me and also relay most of my messages to you.   Our phone number is [phone]. We also have an after hours call service for urgent matters and there is a physician on-call for urgent questions. For any emergencies you know to call 911 or go to the nearest emergency room

## 2013-03-12 ENCOUNTER — Other Ambulatory Visit (INDEPENDENT_AMBULATORY_CARE_PROVIDER_SITE_OTHER): Payer: Medicaid Other

## 2013-03-12 DIAGNOSIS — Z0289 Encounter for other administrative examinations: Secondary | ICD-10-CM

## 2013-03-14 LAB — VITAMIN B12: Vitamin B-12: 546 pg/mL (ref 211–946)

## 2013-03-14 LAB — TSH: TSH: 2.34 u[IU]/mL (ref 0.450–4.500)

## 2013-03-14 LAB — PHENYTOIN LEVEL, FREE AND TOTAL: Phenytoin, Free: NOT DETECTED ug/mL (ref 1.0–2.0)

## 2013-03-14 LAB — VITAMIN D 25 HYDROXY (VIT D DEFICIENCY, FRACTURES): Vit D, 25-Hydroxy: 10.9 ng/mL — ABNORMAL LOW (ref 30.0–100.0)

## 2013-03-20 ENCOUNTER — Telehealth: Payer: Self-pay | Admitting: *Deleted

## 2013-03-20 ENCOUNTER — Other Ambulatory Visit: Payer: Medicaid Other

## 2013-03-20 ENCOUNTER — Telehealth: Payer: Self-pay | Admitting: Neurology

## 2013-03-20 ENCOUNTER — Other Ambulatory Visit: Payer: Self-pay

## 2013-03-20 MED ORDER — PHENYTOIN SODIUM EXTENDED 100 MG PO CAPS: 100.0000 mg | ORAL_CAPSULE | Freq: Three times a day (TID) | ORAL | Status: DC

## 2013-03-20 NOTE — Telephone Encounter (Signed)
I called the patient back.  Advised she would lneed to request this from her PCP.  She verbalized understanding.

## 2013-03-20 NOTE — Telephone Encounter (Signed)
I called pt after she called back and asking about lab work.  I called her stating that she does not need to have any lab work done.  I gave her the results of her labs, Vit D low, and free phenytoin is not detected.  She states that she has been taking both keppra and dilantin.  She has 26 caps left.  I told her that I will refill to [facility] on [address]   Pt having no seizures.  No additional lab work needed at this time.  Pt verbalized understanding.

## 2013-03-20 NOTE — Telephone Encounter (Signed)
[facility] sent Rx, however, it printed rather than going electronically.  She asked me to resend Rx.

## 2013-03-20 NOTE — Telephone Encounter (Signed)
Have her contact her PCP for that medication. Thanks.

## 2013-03-20 NOTE — Telephone Encounter (Signed)
Dr Hosie Poisson, the patient called the front desk requesting a Rx for Colcrys to treat her Gout.  Would you like to prescribe this, or should she request it from her PCP/other physician?  Please advise.  Thank you.

## 2013-03-30 ENCOUNTER — Emergency Department (HOSPITAL_COMMUNITY)
Admission: EM | Admit: 2013-03-30 | Discharge: 2013-03-30 | Disposition: A | Discharge status: Home / Self Care | Payer: Medicaid Other | Attending: Emergency Medicine | Admitting: Emergency Medicine

## 2013-03-30 ENCOUNTER — Emergency Department (HOSPITAL_COMMUNITY): Payer: Medicaid Other

## 2013-03-30 ENCOUNTER — Encounter (HOSPITAL_COMMUNITY): Payer: Self-pay | Admitting: Emergency Medicine

## 2013-03-30 DIAGNOSIS — Z8673 Personal history of transient ischemic attack (TIA), and cerebral infarction without residual deficits: Secondary | ICD-10-CM | POA: Insufficient documentation

## 2013-03-30 DIAGNOSIS — Y92009 Unspecified place in unspecified non-institutional (private) residence as the place of occurrence of the external cause: Secondary | ICD-10-CM | POA: Insufficient documentation

## 2013-03-30 DIAGNOSIS — IMO0002 Reserved for concepts with insufficient information to code with codable children: Secondary | ICD-10-CM | POA: Insufficient documentation

## 2013-03-30 DIAGNOSIS — Z7982 Long term (current) use of aspirin: Secondary | ICD-10-CM | POA: Insufficient documentation

## 2013-03-30 DIAGNOSIS — W19XXXA Unspecified fall, initial encounter: Secondary | ICD-10-CM

## 2013-03-30 DIAGNOSIS — Z794 Long term (current) use of insulin: Secondary | ICD-10-CM | POA: Insufficient documentation

## 2013-03-30 DIAGNOSIS — S0993XA Unspecified injury of face, initial encounter: Secondary | ICD-10-CM | POA: Insufficient documentation

## 2013-03-30 DIAGNOSIS — M549 Dorsalgia, unspecified: Secondary | ICD-10-CM

## 2013-03-30 DIAGNOSIS — R296 Repeated falls: Secondary | ICD-10-CM | POA: Insufficient documentation

## 2013-03-30 DIAGNOSIS — S79919A Unspecified injury of unspecified hip, initial encounter: Secondary | ICD-10-CM | POA: Insufficient documentation

## 2013-03-30 DIAGNOSIS — Y939 Activity, unspecified: Secondary | ICD-10-CM | POA: Insufficient documentation

## 2013-03-30 DIAGNOSIS — E119 Type 2 diabetes mellitus without complications: Secondary | ICD-10-CM | POA: Insufficient documentation

## 2013-03-30 DIAGNOSIS — Z8739 Personal history of other diseases of the musculoskeletal system and connective tissue: Secondary | ICD-10-CM

## 2013-03-30 DIAGNOSIS — Z87891 Personal history of nicotine dependence: Secondary | ICD-10-CM | POA: Insufficient documentation

## 2013-03-30 DIAGNOSIS — M109 Gout, unspecified: Secondary | ICD-10-CM | POA: Insufficient documentation

## 2013-03-30 DIAGNOSIS — Z79899 Other long term (current) drug therapy: Secondary | ICD-10-CM | POA: Insufficient documentation

## 2013-03-30 DIAGNOSIS — S0990XA Unspecified injury of head, initial encounter: Secondary | ICD-10-CM | POA: Insufficient documentation

## 2013-03-30 DIAGNOSIS — G40909 Epilepsy, unspecified, not intractable, without status epilepticus: Secondary | ICD-10-CM | POA: Insufficient documentation

## 2013-03-30 MED ORDER — HYDROCODONE-ACETAMINOPHEN 5-325 MG PO TABS
2.0000 | ORAL_TABLET | Freq: Once | ORAL | Status: AC
  Administered 2013-03-30: 2 via ORAL
  Filled 2013-03-30: qty 2

## 2013-03-30 MED ORDER — COLCHICINE 0.6 MG PO TABS: 0.6000 mg | ORAL_TABLET | Freq: Every day | ORAL | Status: DC

## 2013-03-30 MED ORDER — HYDROCODONE-ACETAMINOPHEN 10-325 MG PO TABS: 1.0000 | ORAL_TABLET | Freq: Four times a day (QID) | ORAL | Status: DC | PRN

## 2013-03-30 NOTE — ED Notes (Signed)
Pt reports fall backwards Friday evening, and now has pain all over, worse with rain, pt reports pain on right side and hip pain and flank pain

## 2013-03-30 NOTE — ED Notes (Signed)
PT ambulated with baseline gait; VSS; A&Ox3; no signs of distress; respirations even and unlabored; skin warm and dry; no questions upon discharge.  

## 2013-03-30 NOTE — ED Provider Notes (Signed)
4:14 PM Sign out received from Elpidio Anis, PA-C, at change of shift.  Pt pending lumbar film results.  If negative, pt to be d/c home with paperwork as printed by Hector Shade, PA.   Discussed all result with patient.  Pt d/c home.   Trixie Dredge, PA-C 03/30/13 [phone]

## 2013-03-30 NOTE — ED Notes (Signed)
Patient transported to [facility] 

## 2013-03-30 NOTE — ED Provider Notes (Signed)
CSN: [id number]     Arrival date & time 03/30/13  1318 History  This chart was scribed for non-physician practitioner Allean Found, PA-C working with Enid Skeens, MD by Valera Castle, ED scribe. This patient was seen in room TR06C/TR06C and the patient's care was started at 2:59 PM.    Chief Complaint  Patient presents with  . Fall   The history is provided by the patient. No language interpreter was used.   HPI Comments: Michelle [name] is a 52 y.o. female who presents to the Emergency Department complaining of moderate, constant, right hip pain, onset 2 days ago after she fell backwards onto carpet in her house. She reports associated neck pain and headache. She reports that she got up and was fine initially, but that she started noticing pain gradually, and that the pain was at its worse last night. She reports ambulating with a cain, as needed since her fall. She reports having a pin in her hip, without complication, but does report pain in the area with the cold. She also reports h/o gout on her left side. She reports taking Vicodin usually for pain, but denies having any currently. She states she is here today for xrays to make sure everything is okay. She denies any other associated symptoms.   Past Medical History  Diagnosis Date  . Seizures   . Stroke   . Meningitis   . Diabetes mellitus without complication    History reviewed. No pertinent past surgical history. Family History  Problem Relation Age of Onset  . Cancer - Colon Mother   . Cancer Father   . Diabetes    . Hypertension Father    History  Substance Use Topics  . Smoking status: Former Smoker    Types: Cigarettes  . Smokeless tobacco: Never Used  . Alcohol Use: No   OB History   Grav Para Term Preterm Abortions TAB SAB Ect Mult Living                 Review of Systems  Musculoskeletal: Positive for arthralgias (right hip/flank.) and neck pain.  Neurological: Positive for headaches. Negative for syncope.   All other systems reviewed and are negative.    Allergies  Morphine and related  Home Medications   Current Outpatient Rx  Name  Route  Sig  Dispense  Refill  . acyclovir (ZOVIRAX) 800 MG tablet   Oral   Take 800 mg by mouth daily.         [name] aspirin EC 81 MG tablet   Oral   Take 81 mg by mouth daily.         [name] diltiazem (DILACOR XR) 180 MG 24 hr capsule   Oral   Take 180 mg by mouth daily.         [name] emtricitabine-tenofovir (TRUVADA) 200-300 MG per tablet   Oral   Take 1 tablet by mouth daily.         . furosemide (LASIX) 40 MG tablet   Oral   Take 40 mg by mouth daily as needed for fluid.         [name] HYDROcodone-acetaminophen (NORCO/VICODIN) 5-325 MG per tablet   Oral   Take 1 tablet by mouth every 6 (six) hours as needed for pain.         [name] Hydrocortisone (ANUSOL-HC RE)   Rectal   Place 1 application rectally daily as needed (for hemorrhoids).         . insulin  glargine (LANTUS) 100 UNIT/ML injection   Subcutaneous   Inject 30 Units into the skin at bedtime.         . levETIRAcetam (KEPPRA) 500 MG tablet   Oral   Take 1,500 mg by mouth every 12 (twelve) hours.         [name] lisinopril (PRINIVIL,ZESTRIL) 40 MG tablet   Oral   Take 40 mg by mouth daily.         [name] lopinavir-ritonavir (KALETRA) 200-50 MG per tablet   Oral   Take 2 tablets by mouth 2 (two) times daily.         . metoprolol (LOPRESSOR) 50 MG tablet   Oral   Take 50 mg by mouth 2 (two) times daily.         . pantoprazole (PROTONIX) 40 MG tablet   Oral   Take 40 mg by mouth daily.         . phenytoin (DILANTIN) 100 MG ER capsule   Oral   Take 1 capsule (100 mg total) by mouth 3 (three) times daily.   90 capsule   6   . potassium chloride SA (K-DUR,KLOR-CON) 20 MEQ tablet   Oral   Take 1 tablet (20 mEq total) by mouth daily.         [name] triamcinolone cream (KENALOG) 0.1 %   Topical   Apply 1 application topically 2 (two) times daily.          Triage Vitals:  BP 157/74  Pulse 54  Temp(Src) 99 F (37.2 C) (Oral)  Resp 17  Ht 5' 5.5" (1.664 m)  Wt 187 lb (84.823 kg)  BMI 30.63 kg/m2  SpO2 100%  Physical Exam  Nursing note and vitals reviewed. Constitutional: She is oriented to person, place, and time. She appears well-developed and well-nourished. No distress.  HENT:  Head: Normocephalic and atraumatic.  Eyes: EOM are normal.  Neck: Neck supple. No tracheal deviation present.  Cardiovascular: Normal rate.   DP intact.  Pulmonary/Chest: Effort normal. No respiratory distress.  Abdominal: Soft. There is no tenderness.  Genitourinary:  No CVA tenderness.  Musculoskeletal: Normal range of motion.  Midline lumbar and right para lumbar tenderness. Right lateral chest wall mild tenderness without ecchymosis. Generalized tenderness to right LE with full ROM and no swelling.  Neurological: She is alert and oriented to person, place, and time.  Skin: Skin is warm and dry.  Psychiatric: She has a normal mood and affect. Her behavior is normal.    ED Course  Procedures (including critical care time)  DIAGNOSTIC STUDIES: Oxygen Saturation is 100% on room air, normal by my interpretation.    COORDINATION OF CARE: 3:05 PM-Discussed treatment plan which includes Hydrocodone and a l-spine xray with pt at bedside and pt agreed to plan.   Labs Review Labs Reviewed - No data to display Imaging Review No results found.  EKG Interpretation   None      Meds ordered this encounter  Medications  . insulin aspart (NOVOLOG) 100 UNIT/ML injection    Sig: Inject 10-12 Units into the skin daily. Per sliding scale  . HYDROcodone-acetaminophen (NORCO/VICODIN) 5-325 MG per tablet 2 tablet    Sig:   Dg Lumbar Spine Complete  03/30/2013   CLINICAL DATA:  Fall. Right low back and hip pain.  EXAM: LUMBAR SPINE - COMPLETE 4+ VIEW  COMPARISON:  None.  FINDINGS: There is no evidence of lumbar spine fracture. Alignment is normal. Intervertebral disc spaces  are maintained.  IMPRESSION:  Negative.   Electronically Signed   By: Myles Rosenthal M.D.   On: 03/30/2013 16:17     MDM  No diagnosis found. 1. Fall 2. Hip pain 3. Myalgias  Uncomplicated fall without bony injury. She is ambulating in the department without unsteadiness or difficulty.      I personally performed the services described in this documentation, which was scribed in my presence. The recorded information has been reviewed and is accurate.     Arnoldo Hooker, PA-C 04/11/13 [phone]

## 2013-03-30 NOTE — ED Notes (Signed)
Also reprots out of gout medicaiton for pain.

## 2013-03-31 NOTE — ED Provider Notes (Signed)
Medical screening examination/treatment/procedure(s) were performed by non-physician practitioner and as supervising physician I was immediately available for consultation/collaboration.  EKG Interpretation   None        Enid Skeens, MD 03/31/13 1712

## 2013-04-01 NOTE — Telephone Encounter (Signed)
Order for dilantin.

## 2013-04-11 NOTE — ED Provider Notes (Signed)
Medical screening examination/treatment/procedure(s) were performed by non-physician practitioner and as supervising physician I was immediately available for consultation/collaboration.  EKG Interpretation   None         Enid Skeens, MD 04/11/13 [phone]

## 2013-04-22 ENCOUNTER — Encounter (HOSPITAL_COMMUNITY): Payer: Self-pay | Admitting: Emergency Medicine

## 2013-04-22 ENCOUNTER — Emergency Department (HOSPITAL_COMMUNITY)
Admission: EM | Admit: 2013-04-22 | Discharge: 2013-04-22 | Disposition: A | Discharge status: Home / Self Care | Payer: Medicaid Other | Source: Home / Self Care | Attending: Family Medicine | Admitting: Family Medicine

## 2013-04-22 DIAGNOSIS — M109 Gout, unspecified: Secondary | ICD-10-CM

## 2013-04-22 MED ORDER — COLCHICINE 0.6 MG PO TABS: 0.6000 mg | ORAL_TABLET | Freq: Every day | ORAL | Status: DC

## 2013-04-22 MED ORDER — ACETAMINOPHEN 325 MG PO TABS
650.0000 mg | ORAL_TABLET | Freq: Once | ORAL | Status: AC
  Administered 2013-04-22: 650 mg via ORAL

## 2013-04-22 MED ORDER — HYDROCODONE-ACETAMINOPHEN 5-325 MG PO TABS: 1.0000 | ORAL_TABLET | Freq: Four times a day (QID) | ORAL | Status: DC | PRN

## 2013-04-22 MED ORDER — ACETAMINOPHEN 325 MG PO TABS
ORAL_TABLET | ORAL | Status: AC
  Filled 2013-04-22: qty 2

## 2013-04-22 NOTE — ED Notes (Signed)
Pt c/o gout flare up onset 1 week; bilateral feet Takes colcrys but has run out.... Does not have PCP Also w/o lower back pain that increases w/activity Takes Norco but has also ran out Alert w/no signs of acute distress.... Brought back in wheel chair

## 2013-04-22 NOTE — ED Provider Notes (Signed)
Michelle [name] is a 52 y.o. female who presents to Urgent Care today for gout. Patient notes bilateral foot and ankle and left wrist pain. This is been present for about one week. She has a history of gout occurring off and on.  She was seen in the emergency room in early November for the same and given Norco and colchicine.  She notes her pain has returned recently. She is new to the area and not yet established with primary care.  Patient was found to have a mild elevated temperature in the clinic today. She feels well without any cough cold congestion trouble breathing nausea vomiting, or dysuria.   Past Medical History  Diagnosis Date  . Seizures   . Stroke   . Meningitis   . Diabetes mellitus without complication    History  Substance Use Topics  . Smoking status: Former Smoker    Types: Cigarettes  . Smokeless tobacco: Never Used  . Alcohol Use: No   ROS as above Medications reviewed. No current facility-administered medications for this encounter.   Current Outpatient Prescriptions  Medication Sig Dispense Refill  . acyclovir (ZOVIRAX) 800 MG tablet Take 800 mg by mouth daily.      [name] aspirin EC 81 MG tablet Take 81 mg by mouth daily.      . colchicine 0.6 MG tablet Take 1 tablet (0.6 mg total) by mouth daily.  30 tablet  0  . diltiazem (DILACOR XR) 180 MG 24 hr capsule Take 180 mg by mouth daily.      [name] emtricitabine-tenofovir (TRUVADA) 200-300 MG per tablet Take 1 tablet by mouth daily.      . furosemide (LASIX) 40 MG tablet Take 40 mg by mouth daily as needed for fluid.      [name] HYDROcodone-acetaminophen (NORCO) 10-325 MG per tablet Take 1 tablet by mouth every 6 (six) hours as needed for pain.  15 tablet  0  . HYDROcodone-acetaminophen (NORCO/VICODIN) 5-325 MG per tablet Take 1 tablet by mouth every 6 (six) hours as needed.  10 tablet  0  . insulin aspart (NOVOLOG) 100 UNIT/ML injection Inject 10-12 Units into the skin daily. Per sliding scale      . insulin glargine  (LANTUS) 100 UNIT/ML injection Inject 30 Units into the skin at bedtime.      . levETIRAcetam (KEPPRA) 500 MG tablet Take 1,500 mg by mouth every 12 (twelve) hours.      [name] lisinopril (PRINIVIL,ZESTRIL) 40 MG tablet Take 40 mg by mouth daily.      [name] lopinavir-ritonavir (KALETRA) 200-50 MG per tablet Take 2 tablets by mouth 2 (two) times daily.      . metoprolol (LOPRESSOR) 50 MG tablet Take 50 mg by mouth 2 (two) times daily.      . pantoprazole (PROTONIX) 40 MG tablet Take 40 mg by mouth daily.      . phenytoin (DILANTIN) 100 MG ER capsule Take 1 capsule (100 mg total) by mouth 3 (three) times daily.  90 capsule  6  . potassium chloride SA (K-DUR,KLOR-CON) 20 MEQ tablet Take 1 tablet (20 mEq total) by mouth daily.      [name] triamcinolone cream (KENALOG) 0.1 % Apply 1 application topically 2 (two) times daily.        Exam:  BP 133/81  Pulse 63  Temp(Src) 100.7 F (38.2 C) (Oral)  Resp 18  SpO2 100% Gen: Well NAD HEENT: EOMI,  MMM Lungs: Normal work of breathing. CTABL Heart: RRR no MRG Abd:  NABS, Soft. NT, ND Exts: Non edematous BL  LE, warm and well perfused.  Feet bilaterally are swollen and tender.    No results found for this or any previous visit (from the past 24 hour(s)). No results found.  Assessment and Plan: 52 y.o. female with  1) Gout pain: Plan to treat with colchicine and limited amount of Norco.  F/u with PCP asap.  2) Fever: Asymptomatic and mild. Unclear etiology. Followup with primary care provider if not improved. Go to emergency room if significantly worse.  Discussed warning signs or symptoms. Please see discharge instructions. Patient expresses understanding.      Rodolph Bong, MD 04/22/13 2022

## 2013-05-01 ENCOUNTER — Telehealth: Payer: Self-pay

## 2013-05-01 NOTE — Telephone Encounter (Signed)
Pt calling for referral for HIV Care.  She stated she was in need of help with naviagating the system. I referred her to [facility].  Appointment given to [facility] , Case Manager.  No medical records at this time.   Laurell Josephs, RN

## 2013-05-07 ENCOUNTER — Other Ambulatory Visit (HOSPITAL_COMMUNITY)
Admission: RE | Admit: 2013-05-07 | Discharge: 2013-05-07 | Disposition: A | Discharge status: Home / Self Care | Payer: Medicaid Other | Source: Ambulatory Visit | Attending: Internal Medicine | Admitting: Internal Medicine

## 2013-05-07 ENCOUNTER — Ambulatory Visit: Payer: Medicaid Other

## 2013-05-07 DIAGNOSIS — B20 Human immunodeficiency virus [HIV] disease: Secondary | ICD-10-CM

## 2013-05-07 DIAGNOSIS — B009 Herpesviral infection, unspecified: Secondary | ICD-10-CM

## 2013-05-07 DIAGNOSIS — G43909 Migraine, unspecified, not intractable, without status migrainosus: Secondary | ICD-10-CM

## 2013-05-07 DIAGNOSIS — I639 Cerebral infarction, unspecified: Secondary | ICD-10-CM

## 2013-05-07 DIAGNOSIS — Z113 Encounter for screening for infections with a predominantly sexual mode of transmission: Secondary | ICD-10-CM | POA: Insufficient documentation

## 2013-05-07 DIAGNOSIS — E119 Type 2 diabetes mellitus without complications: Secondary | ICD-10-CM

## 2013-05-07 LAB — LIPID PANEL
Cholesterol: 185 mg/dL (ref 0–200)
Total CHOL/HDL Ratio: 3.9 Ratio
Triglycerides: 246 mg/dL — ABNORMAL HIGH (ref <150)
VLDL: 49 mg/dL — ABNORMAL HIGH (ref 0–40)

## 2013-05-07 LAB — COMPLETE METABOLIC PANEL WITH GFR
ALT: 61 U/L — ABNORMAL HIGH (ref 0–35)
AST: 76 U/L — ABNORMAL HIGH (ref 0–37)
Albumin: 2.6 g/dL — ABNORMAL LOW (ref 3.5–5.2)
Alkaline Phosphatase: 176 U/L — ABNORMAL HIGH (ref 39–117)
GFR, Est Non African American: 23 mL/min — ABNORMAL LOW
Potassium: 3.9 mEq/L (ref 3.5–5.3)
Sodium: 138 mEq/L (ref 135–145)
Total Bilirubin: 0.3 mg/dL (ref 0.3–1.2)
Total Protein: 7.4 g/dL (ref 6.0–8.3)

## 2013-05-07 LAB — CBC WITH DIFFERENTIAL/PLATELET
Basophils Absolute: 0 10*3/uL (ref 0.0–0.1)
Basophils Relative: 0 % (ref 0–1)
Eosinophils Absolute: 0.1 10*3/uL (ref 0.0–0.7)
Hemoglobin: 8.7 g/dL — ABNORMAL LOW (ref 12.0–15.0)
MCHC: 33.6 g/dL (ref 30.0–36.0)
MCV: 97.7 fL (ref 78.0–100.0)
Neutro Abs: 2.3 10*3/uL (ref 1.7–7.7)
Neutrophils Relative %: 49 % (ref 43–77)
RDW: 14.3 % (ref 11.5–15.5)
WBC: 4.7 10*3/uL (ref 4.0–10.5)

## 2013-05-08 LAB — URINALYSIS
Bilirubin Urine: NEGATIVE
Glucose, UA: NEGATIVE mg/dL
Nitrite: NEGATIVE
Protein, ur: >300 mg/dL — AB
Urobilinogen, UA: 0.2 mg/dL (ref 0.0–1.0)

## 2013-05-08 LAB — HEPATITIS B CORE ANTIBODY, TOTAL: Hep B Core Total Ab: NONREACTIVE

## 2013-05-08 LAB — HEPATITIS C ANTIBODY: HCV Ab: REACTIVE — AB

## 2013-05-08 LAB — HEPATITIS B SURFACE ANTIBODY,QUALITATIVE: Hep B S Ab: POSITIVE — AB

## 2013-05-08 LAB — T-HELPER CELL (CD4) - (RCID CLINIC ONLY)
CD4 % Helper T Cell: 27 % — ABNORMAL LOW (ref 33–55)
CD4 T Cell Abs: 490 /uL (ref 400–2700)

## 2013-05-08 LAB — HIV-1 RNA ULTRAQUANT REFLEX TO GENTYP+
HIV 1 RNA Quant: <20 copies/mL (ref <20)
HIV-1 RNA Quant, Log: <1.3 {Log} (ref <1.30)

## 2013-05-08 LAB — T.PALLIDUM AB, TOTAL: T pallidum Antibodies (TP-PA): >8 S/CO — ABNORMAL HIGH (ref <0.90)

## 2013-05-08 LAB — HEPATITIS A ANTIBODY, TOTAL: Hep A Total Ab: REACTIVE — AB

## 2013-05-08 LAB — HEPATITIS B SURFACE ANTIGEN: Hepatitis B Surface Ag: NEGATIVE

## 2013-05-10 ENCOUNTER — Inpatient Hospital Stay (HOSPITAL_COMMUNITY): Payer: Medicaid Other

## 2013-05-10 ENCOUNTER — Telehealth: Payer: Self-pay | Admitting: *Deleted

## 2013-05-10 ENCOUNTER — Inpatient Hospital Stay (HOSPITAL_COMMUNITY)
Admission: EM | Admit: 2013-05-10 | Discharge: 2013-05-16 | DRG: 682 | Disposition: A | Discharge status: Home / Self Care | Payer: Medicaid Other | Attending: Internal Medicine | Admitting: Internal Medicine

## 2013-05-10 ENCOUNTER — Encounter (HOSPITAL_COMMUNITY): Payer: Self-pay | Admitting: Emergency Medicine

## 2013-05-10 ENCOUNTER — Emergency Department (HOSPITAL_COMMUNITY): Payer: Medicaid Other

## 2013-05-10 DIAGNOSIS — N184 Chronic kidney disease, stage 4 (severe): Secondary | ICD-10-CM | POA: Diagnosis present

## 2013-05-10 DIAGNOSIS — G43909 Migraine, unspecified, not intractable, without status migrainosus: Secondary | ICD-10-CM | POA: Diagnosis present

## 2013-05-10 DIAGNOSIS — B009 Herpesviral infection, unspecified: Secondary | ICD-10-CM | POA: Diagnosis present

## 2013-05-10 DIAGNOSIS — Z87891 Personal history of nicotine dependence: Secondary | ICD-10-CM

## 2013-05-10 DIAGNOSIS — I639 Cerebral infarction, unspecified: Secondary | ICD-10-CM

## 2013-05-10 DIAGNOSIS — E119 Type 2 diabetes mellitus without complications: Secondary | ICD-10-CM | POA: Insufficient documentation

## 2013-05-10 DIAGNOSIS — Z79899 Other long term (current) drug therapy: Secondary | ICD-10-CM

## 2013-05-10 DIAGNOSIS — Z7982 Long term (current) use of aspirin: Secondary | ICD-10-CM

## 2013-05-10 DIAGNOSIS — R569 Unspecified convulsions: Secondary | ICD-10-CM | POA: Diagnosis present

## 2013-05-10 DIAGNOSIS — N390 Urinary tract infection, site not specified: Secondary | ICD-10-CM | POA: Diagnosis present

## 2013-05-10 DIAGNOSIS — I129 Hypertensive chronic kidney disease with stage 1 through stage 4 chronic kidney disease, or unspecified chronic kidney disease: Principal | ICD-10-CM | POA: Diagnosis present

## 2013-05-10 DIAGNOSIS — B20 Human immunodeficiency virus [HIV] disease: Secondary | ICD-10-CM | POA: Diagnosis present

## 2013-05-10 DIAGNOSIS — I674 Hypertensive encephalopathy: Secondary | ICD-10-CM | POA: Diagnosis present

## 2013-05-10 DIAGNOSIS — R51 Headache: Secondary | ICD-10-CM

## 2013-05-10 DIAGNOSIS — N183 Chronic kidney disease, stage 3 unspecified: Secondary | ICD-10-CM | POA: Diagnosis present

## 2013-05-10 DIAGNOSIS — M25539 Pain in unspecified wrist: Secondary | ICD-10-CM | POA: Diagnosis present

## 2013-05-10 DIAGNOSIS — D539 Nutritional anemia, unspecified: Secondary | ICD-10-CM | POA: Diagnosis present

## 2013-05-10 DIAGNOSIS — Z794 Long term (current) use of insulin: Secondary | ICD-10-CM

## 2013-05-10 DIAGNOSIS — I6350 Cerebral infarction due to unspecified occlusion or stenosis of unspecified cerebral artery: Secondary | ICD-10-CM | POA: Diagnosis present

## 2013-05-10 DIAGNOSIS — G40909 Epilepsy, unspecified, not intractable, without status epilepticus: Secondary | ICD-10-CM | POA: Diagnosis present

## 2013-05-10 DIAGNOSIS — D638 Anemia in other chronic diseases classified elsewhere: Secondary | ICD-10-CM | POA: Diagnosis present

## 2013-05-10 DIAGNOSIS — M109 Gout, unspecified: Secondary | ICD-10-CM

## 2013-05-10 DIAGNOSIS — N189 Chronic kidney disease, unspecified: Secondary | ICD-10-CM

## 2013-05-10 DIAGNOSIS — Z8673 Personal history of transient ischemic attack (TIA), and cerebral infarction without residual deficits: Secondary | ICD-10-CM

## 2013-05-10 DIAGNOSIS — A498 Other bacterial infections of unspecified site: Secondary | ICD-10-CM | POA: Diagnosis present

## 2013-05-10 DIAGNOSIS — I161 Hypertensive emergency: Secondary | ICD-10-CM | POA: Diagnosis present

## 2013-05-10 DIAGNOSIS — R4182 Altered mental status, unspecified: Secondary | ICD-10-CM

## 2013-05-10 DIAGNOSIS — I10 Essential (primary) hypertension: Secondary | ICD-10-CM

## 2013-05-10 HISTORY — DX: Gout, unspecified: M10.9

## 2013-05-10 HISTORY — DX: Other muscle spasm: M62.838

## 2013-05-10 HISTORY — DX: Essential (primary) hypertension: I10

## 2013-05-10 HISTORY — DX: Human immunodeficiency virus (HIV) disease: B20

## 2013-05-10 LAB — URINALYSIS, ROUTINE W REFLEX MICROSCOPIC
Bilirubin Urine: NEGATIVE
Ketones, ur: NEGATIVE mg/dL
Nitrite: NEGATIVE
Protein, ur: >300 mg/dL — AB
Urobilinogen, UA: 0.2 mg/dL (ref 0.0–1.0)
pH: 6 (ref 5.0–8.0)

## 2013-05-10 LAB — CBC WITH DIFFERENTIAL/PLATELET
Basophils Absolute: 0 10*3/uL (ref 0.0–0.1)
Eosinophils Absolute: 0.1 10*3/uL (ref 0.0–0.7)
HCT: 25.3 % — ABNORMAL LOW (ref 36.0–46.0)
Hemoglobin: 8.7 g/dL — ABNORMAL LOW (ref 12.0–15.0)
Lymphs Abs: 1.4 10*3/uL (ref 0.7–4.0)
MCH: 34 pg (ref 26.0–34.0)
MCV: 98.8 fL (ref 78.0–100.0)
Monocytes Relative: 12 % (ref 3–12)
Neutro Abs: 1.9 10*3/uL (ref 1.7–7.7)
Neutrophils Relative %: 49 % (ref 43–77)
RBC: 2.56 MIL/uL — ABNORMAL LOW (ref 3.87–5.11)
WBC: 3.9 10*3/uL — ABNORMAL LOW (ref 4.0–10.5)

## 2013-05-10 LAB — URINE MICROSCOPIC-ADD ON

## 2013-05-10 LAB — BASIC METABOLIC PANEL
BUN: 33 mg/dL — ABNORMAL HIGH (ref 6–23)
Creatinine, Ser: 1.73 mg/dL — ABNORMAL HIGH (ref 0.50–1.10)
GFR calc non Af Amer: 32 mL/min — ABNORMAL LOW (ref >90)
Glucose, Bld: 84 mg/dL (ref 70–99)
Potassium: 4.2 mEq/L (ref 3.5–5.1)

## 2013-05-10 LAB — HEPATIC FUNCTION PANEL
ALT: 50 U/L — ABNORMAL HIGH (ref 0–35)
AST: 53 U/L — ABNORMAL HIGH (ref 0–37)
Albumin: 2.3 g/dL — ABNORMAL LOW (ref 3.5–5.2)
Bilirubin, Direct: 0.2 mg/dL (ref 0.0–0.3)
Indirect Bilirubin: 0.3 mg/dL (ref 0.3–0.9)
Total Bilirubin: 0.5 mg/dL (ref 0.3–1.2)

## 2013-05-10 LAB — PHENYTOIN LEVEL, TOTAL: Phenytoin Lvl: <2.5 ug/mL — ABNORMAL LOW (ref 10.0–20.0)

## 2013-05-10 LAB — RAPID URINE DRUG SCREEN, HOSP PERFORMED
Amphetamines: NOT DETECTED
Barbiturates: NOT DETECTED
Cocaine: NOT DETECTED
Opiates: NOT DETECTED
Tetrahydrocannabinol: NOT DETECTED

## 2013-05-10 LAB — PROTIME-INR: Prothrombin Time: 12.8 seconds (ref 11.6–15.2)

## 2013-05-10 LAB — GLUCOSE, CAPILLARY: Glucose-Capillary: 113 mg/dL — ABNORMAL HIGH (ref 70–99)

## 2013-05-10 LAB — APTT: aPTT: 24 seconds (ref 24–37)

## 2013-05-10 LAB — PHOSPHORUS: Phosphorus: 5.3 mg/dL — ABNORMAL HIGH (ref 2.3–4.6)

## 2013-05-10 MED ORDER — DIPHENHYDRAMINE HCL 50 MG/ML IJ SOLN
25.0000 mg | Freq: Once | INTRAMUSCULAR | Status: AC
  Administered 2013-05-10: 25 mg via INTRAVENOUS
  Filled 2013-05-10: qty 1

## 2013-05-10 MED ORDER — HYDRALAZINE HCL 20 MG/ML IJ SOLN
10.0000 mg | Freq: Once | INTRAMUSCULAR | Status: AC
  Administered 2013-05-10: 10 mg via INTRAVENOUS
  Filled 2013-05-10: qty 1

## 2013-05-10 MED ORDER — SODIUM CHLORIDE 0.9 % IV SOLN
1500.0000 mg | Freq: Two times a day (BID) | INTRAVENOUS | Status: DC
  Administered 2013-05-11 – 2013-05-12 (×3): 1500 mg via INTRAVENOUS
  Filled 2013-05-10 (×5): qty 15

## 2013-05-10 MED ORDER — INSULIN ASPART 100 UNIT/ML ~~LOC~~ SOLN
0.0000 [IU] | SUBCUTANEOUS | Status: DC
  Administered 2013-05-11: 2 [IU] via SUBCUTANEOUS

## 2013-05-10 MED ORDER — PHENYTOIN SODIUM 50 MG/ML IJ SOLN
100.0000 mg | Freq: Three times a day (TID) | INTRAMUSCULAR | Status: DC
  Administered 2013-05-11 – 2013-05-12 (×5): 100 mg via INTRAVENOUS
  Filled 2013-05-10 (×8): qty 2

## 2013-05-10 MED ORDER — SODIUM CHLORIDE 0.9 % IV BOLUS (SEPSIS)
500.0000 mL | Freq: Once | INTRAVENOUS | Status: AC
  Administered 2013-05-10: 500 mL via INTRAVENOUS

## 2013-05-10 MED ORDER — NITROGLYCERIN IN D5W 200-5 MCG/ML-% IV SOLN
2.0000 ug/min | INTRAVENOUS | Status: DC
  Administered 2013-05-10: 5 ug/min via INTRAVENOUS
  Filled 2013-05-10: qty 250

## 2013-05-10 MED ORDER — METOCLOPRAMIDE HCL 5 MG/ML IJ SOLN
10.0000 mg | Freq: Once | INTRAMUSCULAR | Status: AC
  Administered 2013-05-10: 10 mg via INTRAVENOUS
  Filled 2013-05-10: qty 2

## 2013-05-10 MED ORDER — LABETALOL HCL 5 MG/ML IV SOLN
20.0000 mg | Freq: Once | INTRAVENOUS | Status: AC
  Administered 2013-05-10: 20 mg via INTRAVENOUS
  Filled 2013-05-10: qty 4

## 2013-05-10 MED ORDER — LABETALOL HCL 5 MG/ML IV SOLN
40.0000 mg | Freq: Once | INTRAVENOUS | Status: AC
  Administered 2013-05-10: 40 mg via INTRAVENOUS
  Filled 2013-05-10: qty 8

## 2013-05-10 MED ORDER — LABETALOL HCL 5 MG/ML IV SOLN
0.5000 mg/min | INTRAVENOUS | Status: DC
  Administered 2013-05-10: 2.5 mg/min via INTRAVENOUS
  Administered 2013-05-10: 0.5 mg/min via INTRAVENOUS
  Administered 2013-05-11: 2.5 mg/min via INTRAVENOUS
  Administered 2013-05-11: 1.5 mg/min via INTRAVENOUS
  Administered 2013-05-11: 2 mg/min via INTRAVENOUS
  Administered 2013-05-12: 2.5 mg/min via INTRAVENOUS
  Administered 2013-05-12 – 2013-05-13 (×3): 2 mg/min via INTRAVENOUS
  Filled 2013-05-10 (×11): qty 100

## 2013-05-10 MED ORDER — PANTOPRAZOLE SODIUM 40 MG IV SOLR
40.0000 mg | INTRAVENOUS | Status: DC
  Administered 2013-05-10 – 2013-05-14 (×5): 40 mg via INTRAVENOUS
  Filled 2013-05-10 (×6): qty 40

## 2013-05-10 MED ORDER — KETOROLAC TROMETHAMINE 30 MG/ML IJ SOLN
30.0000 mg | Freq: Once | INTRAMUSCULAR | Status: AC
  Administered 2013-05-10: 30 mg via INTRAVENOUS
  Filled 2013-05-10 (×2): qty 1

## 2013-05-10 NOTE — ED Notes (Signed)
Pt states this is her typical headache.  She takes pain meds every day for headaches and muscle spasms.  She is due to have her 1st visit with her new pcp next week, but she ran out of pain meds.  She states she is up-to-date with her bp medication and took it last night, but she is hypertensive.

## 2013-05-10 NOTE — Telephone Encounter (Signed)
Left message to start taking the Truvada rx every other day due to her Lab Work, Creatinine.  If pt has questions she is to call [facility].

## 2013-05-10 NOTE — ED Notes (Signed)
PER [facility] patient has c/o of headache x1 week and chronic back pain.  Pt is HIV Positive.  Has has been without her hydrocodone x 1 week.  Pt denies N/V/D, no light sensitivity, and no SOB.  Pt states she has "mild dizziness".  Pt is ambulatory.

## 2013-05-10 NOTE — Progress Notes (Signed)
Patient is transferring from [place], [place].  Recently moved to [place]. She was diagnosed in 1988. She reports strict adherence with HIV medications.  Currently she is having headache pain and has requested refill of hydrocodone. I have explained this is a nurse visit only and I am not able to refill narcotic medications.  She states she may go to the Emergency Room due to headache pain or may take Goodie powder.  Records requested.  Vaccines updated.  Laurell Josephs, RN

## 2013-05-10 NOTE — ED Notes (Signed)
Pt waving her hand and saying "I don't know" when asked what's wrong.  Moving her eyes back and forth frantically.  Dr Bernette Mayers notified and ordered benadryl.

## 2013-05-10 NOTE — ED Notes (Signed)
Pt back from CT.  Not verbalizing, but able to follow commands, able to nod when asked if she is cold and able to nod when asked if the blankets are helping.

## 2013-05-10 NOTE — H&P (Signed)
PULMONARY  / CRITICAL CARE MEDICINE  Name: Michelle [name] MRN: [id number] DOB: [birth year]    ADMISSION DATE:  05/10/2013 CONSULTATION DATE:  05/10/2013  REFERRING MD :  EDP PRIMARY SERVICE: PCCM   CHIEF COMPLAINT:  Hypertensive Emergency   BRIEF PATIENT DESCRIPTION: 52 y/o F admitted with hypertensive emergency and encephalopathy.   SIGNIFICANT EVENTS / STUDIES:  12/12  CT head >>> nad  LINES / TUBES: Foley 12/12 >>>  CULTURES:  ANTIBIOTICS:  The patient is encephalopathic and unable to provide history, which was obtained for available medical records.  HISTORY OF PRESENT ILLNESS:  52 yo with HIV, DM, HTN, CVA, and seizures, who presented to [facility] ER on 12/12 with a one week history of severe left sided throbbing headache. She denied blurry vision, nausea, vomiting, fevers, URI symptoms.  Patient has apparently had frequent relocations and is from [place], then to [place], [place] and most recently [place].  She does not have a primary care MD or ID MD.  Recently had a seizure approx 3 months prior to admit and was seen by Neuro, given dilantin & Keppra.  She has not had follow up since.    ER course noted initial BP of 229/111.  Patient had decline of mental status and then vomited in ER.  CT of head was negative for acute abnormality.  Nito gtt initiated requiring titration.   Patient then was noted to have periods of repetitive speech and confusion.  PCCM called for ICU admit.   PAST MEDICAL HISTORY :  Past Medical History  Diagnosis Date  . Seizures   . Stroke   . Meningitis   . Diabetes mellitus without complication   . HIV (human immunodeficiency virus infection)   . Hypertension   . Gout   . Muscle spasms of head and/or neck    History reviewed. No pertinent past surgical history. Prior to Admission medications   Medication Sig Start Date End Date Taking? Authorizing Provider  acyclovir (ZOVIRAX) 800 MG tablet Take 800 mg by mouth daily.   Yes Historical Provider, MD  aspirin  EC 81 MG tablet Take 81 mg by mouth daily.   Yes Historical Provider, MD  cholecalciferol (VITAMIN D) 1000 UNITS tablet Take 1,000 Units by mouth daily.   Yes Historical Provider, MD  colchicine 0.6 MG tablet Take 1 tablet (0.6 mg total) by mouth daily. 04/22/13  Yes Rodolph Bong, MD  emtricitabine-tenofovir (TRUVADA) 200-300 MG per tablet Take 1 tablet by mouth every other day.    Yes Historical Provider, MD  furosemide (LASIX) 40 MG tablet Take 40 mg by mouth daily as needed for fluid.   Yes Historical Provider, MD  HYDROcodone-acetaminophen (NORCO) 10-325 MG per tablet Take 1 tablet by mouth every 6 (six) hours as needed for pain. 03/30/13  Yes Shari A Upstill, PA-C  insulin aspart (NOVOLOG) 100 UNIT/ML injection Inject 10-12 Units into the skin daily. Per sliding scale   Yes Historical Provider, MD  insulin glargine (LANTUS) 100 UNIT/ML injection Inject 30 Units into the skin at bedtime.   Yes Historical Provider, MD  levETIRAcetam (KEPPRA) 500 MG tablet Take 1,500 mg by mouth every 12 (twelve) hours.   Yes Historical Provider, MD  lisinopril (PRINIVIL,ZESTRIL) 40 MG tablet Take 40 mg by mouth daily.   Yes Historical Provider, MD  lopinavir-ritonavir (KALETRA) 200-50 MG per tablet Take 2 tablets by mouth 2 (two) times daily.   Yes Historical Provider, MD  LORazepam (ATIVAN) 1 MG tablet Take 1 mg by mouth  daily. Take one tablet for seizure prevention.   Yes Historical Provider, MD  methocarbamol (ROBAXIN) 500 MG tablet Take 1,000 mg by mouth 2 (two) times daily. Take two 500 mg tablets by mouth four times daily   Yes Historical Provider, MD  metoprolol (LOPRESSOR) 50 MG tablet Take 50 mg by mouth 2 (two) times daily.   Yes Historical Provider, MD  nystatin (MYCOSTATIN) powder Apply 100,000 Bottles topically 4 (four) times daily as needed. Rash   Yes Historical Provider, MD  pantoprazole (PROTONIX) 40 MG tablet Take 40 mg by mouth daily.   Yes Historical Provider, MD  triamcinolone cream (KENALOG)  0.1 % Apply 1 application topically 2 (two) times daily as needed. For rash.   Yes Historical Provider, MD   Allergies  Allergen Reactions  . Norvasc [Amlodipine Besylate]     Itching, rash , hives .   [name] Morphine And Related Hives, Itching and Rash   FAMILY HISTORY:  Family History  Problem Relation Age of Onset  . Cancer - Colon Mother   . Cancer Father   . Diabetes    . Hypertension Father    SOCIAL HISTORY:  reports that she has quit smoking. Her smoking use included Cigarettes. She smoked 0.00 packs per day. She has never used smokeless tobacco. She reports that she does not drink alcohol or use illicit drugs.  REVIEW OF SYSTEMS:  Unable to provide.  SUBJECTIVE:   VITAL SIGNS: Temp:  [98.1 F (36.7 C)-99 F (37.2 C)] 98.6 F (37 C) (12/12 1536) Pulse Rate:  [54-87] 79 (12/12 1615) Resp:  [17-32] 18 (12/12 1615) BP: (185-229)/(79-132) 224/113 mmHg (12/12 1615) SpO2:  [97 %-100 %] 100 % (12/12 1615) Weight:  [183 lb (83.008 kg)] 183 lb (83.008 kg) (12/12 0655)  HEMODYNAMICS:   VENTILATOR SETTINGS:   INTAKE / OUTPUT: Intake/Output     12/11 0701 - 12/12 0700 12/12 0701 - 12/13 0700   Urine (mL/kg/hr)  350 (0.4)   Total Output   350   Net   -350         PHYSICAL EXAMINATION: General:  Adult female in NAD Neuro:  Pupils 58mm=R, MAE, no follow commands, speech clear at times, intermittent confusion, ? Decreased mov't on RUE HEENT:  Mm pink/moist, no jvd Cardiovascular:  s1s2 rrr, no m/r/g Lungs:  resp's even/non-labored, lungs bilaterally clear Abdomen:  Round/soft, bsx4 active Musculoskeletal:  No acute deformities Skin:  Warm/dry, no edema  LABS:  CBC  Recent Labs Lab 05/07/13 1009 05/10/13 0845  WBC 4.7 3.9*  HGB 8.7* 8.7*  HCT 25.9* 25.3*  PLT 265 200   Coag's No results found for this basename: APTT, INR,  in the last 168 hours BMET  Recent Labs Lab 05/07/13 1009 05/10/13 0845  NA 138 138  K 3.9 4.2  CL 111 108  CO2 19 21  BUN 39* 33*   CREATININE 2.36* 1.73*  GLUCOSE 61* 84   Electrolytes  Recent Labs Lab 05/07/13 1009 05/10/13 0845  CALCIUM 8.1* 8.4   Sepsis Markers No results found for this basename: LATICACIDVEN, PROCALCITON, O2SATVEN,  in the last 168 hours ABG No results found for this basename: PHART, PCO2ART, PO2ART,  in the last 168 hours Liver Enzymes  Recent Labs Lab 05/07/13 1009  AST 76*  ALT 61*  ALKPHOS 176*  BILITOT 0.3  ALBUMIN 2.6*   Cardiac Enzymes No results found for this basename: TROPONINI, PROBNP,  in the last 168 hours Glucose  Recent Labs Lab 05/10/13 1149  GLUCAP 82   CXR:  12/12 >>> nad  ASSESSMENT / PLAN:  PULMONARY A:  No active issues. P:   Goal SpO2>92 Supplemental oxygen PRN  CARDIOVASCULAR A:  Hypertensive emergency.  Intolerance of Ca channel blockers. P:  Goal BP<180/90 Labetalol gtt D/c Nitroglycerin as tachyphylaxis  ASA, Lisinopril, Metoprolol when able  RENAL A:  Renal failure (acute vs chronic?) P:   Trend BMP Hold preadmission LAsix  GASTROINTESTINAL A:  No active issues. P:   NPO as encephalopathic Preadmission Protonix  HEMATOLOGIC A:  Anemia of chronic disease. P:  Trend CBC SCDs for DVT Px  INFECTIOUS A:  HIV. P:   HAART when able to take  ENDOCRINE A:  DMII. P:   SSI Hold Lantus  NEUROLOGIC A:  Acute encephalopathy in setting of hypertensive emergency.  H/o seizures.  CVA? P:   Neuro checks Preadmission Keppra Brain MRI  I have personally obtained history, examined patient, evaluated and interpreted laboratory and imaging results, reviewed medical records, formulated assessment / plan and placed orders.  CRITICAL CARE:  The patient is critically ill with multiple organ systems failure and requires high complexity decision making for assessment and support, frequent evaluation and titration of therapies, application of advanced monitoring technologies and extensive interpretation of multiple databases. Critical  Care Time devoted to patient care services described in this note is 45 minutes.   Lonia Farber, MD Pulmonary and Critical Care Medicine [facility] Pager: [phone]  05/10/2013, 4:57 PM

## 2013-05-10 NOTE — ED Notes (Signed)
Dr Sheldon at bedside  

## 2013-05-10 NOTE — ED Notes (Signed)
Pt improved.  Sleeping.  BP beginning to trend down.

## 2013-05-10 NOTE — ED Notes (Signed)
PT more alert, speaking in full sentences saying she needs to go home.  However, unable to tell me her name.  Pressures dropping slowly.

## 2013-05-10 NOTE — ED Notes (Signed)
Critical care at bedside  

## 2013-05-10 NOTE — ED Notes (Signed)
ED ERN notified w/titration of nitro gtt 4 times in an hour pt not stable enough at this time for stepdown bed.

## 2013-05-10 NOTE — H&P (Signed)
Date: 05/10/2013               Patient Name:  Michelle [name] MRN: [id number]  DOB: 1960-09-25 Age / Sex: 52 y.o., female   PCP: No Pcp Per Patient         Medical Service: Internal Medicine Teaching Service         Attending Physician: Dr. Dalphine Handing.    First Contact: Dr. Mariea Clonts Pager: [phone]  Second Contact: Dr. Zada Girt Pager: [phone]       After Hours (After 5p/  First Contact Pager: [phone]  weekends / holidays): Second Contact Pager: [phone]   Chief Complaint: Headaches and altered mental status.  History of Present Illness: Michelle [name] is a 52 year old lady with PMH of HTN, CKD- 3, HIV, Seizure, normocytic anemia, migraines, DM and Herpes simplex.  Hx could not be ascertained from pt when we got to pts bedside as she was significantly confused, but responsive. Presented to the Ed with c/o of headaches for the past 1 week as per ED physician note, left sided/central, she denied any change in vision, no nausea or vomiting. Was previously in [place], and in the [place], [place], and has not yet established care here. First HIV appt was next week. She was admitted to the hospital for seizure about 3 months ago, seen in followup at Neuro and continued on Dilantin and Keppra. Pt was given reglan in the and later as per ED nurse, pt became confused, could not respond to questions, but this later improved and pt could respond, albiet still confused. Bneadryl was then ordered.  Meds: Current Facility-Administered Medications  Medication Dose Route Frequency Provider Last Rate Last Dose  . labetalol (NORMODYNE,TRANDATE) 4 mg/mL in dextrose 5 % 125 mL infusion  0.5-3 mg/min Intravenous Titrated Konstantin Zubelevitskiy, MD      . levETIRAcetam (KEPPRA) 1,500 mg in sodium chloride 0.9 % 100 mL IVPB  1,500 mg Intravenous Q12H Konstantin Zubelevitskiy, MD      . pantoprazole (PROTONIX) injection 40 mg  40 mg Intravenous Q24H Lonia Farber, MD   40 mg at 05/10/13 1811    Current Outpatient Prescriptions  Medication Sig Dispense Refill  . acyclovir (ZOVIRAX) 800 MG tablet Take 800 mg by mouth daily.      [name] aspirin EC 81 MG tablet Take 81 mg by mouth daily.      . cholecalciferol (VITAMIN D) 1000 UNITS tablet Take 1,000 Units by mouth daily.      . colchicine 0.6 MG tablet Take 1 tablet (0.6 mg total) by mouth daily.  30 tablet  0  . emtricitabine-tenofovir (TRUVADA) 200-300 MG per tablet Take 1 tablet by mouth every other day.       . furosemide (LASIX) 40 MG tablet Take 40 mg by mouth daily as needed for fluid.      [name] HYDROcodone-acetaminophen (NORCO) 10-325 MG per tablet Take 1 tablet by mouth every 6 (six) hours as needed for pain.  15 tablet  0  . insulin aspart (NOVOLOG) 100 UNIT/ML injection Inject 10-12 Units into the skin daily. Per sliding scale      . insulin glargine (LANTUS) 100 UNIT/ML injection Inject 30 Units into the skin at bedtime.      . levETIRAcetam (KEPPRA) 500 MG tablet Take 1,500 mg by mouth every 12 (twelve) hours.      [name] lisinopril (PRINIVIL,ZESTRIL) 40 MG tablet Take 40 mg by mouth daily.      [name] lopinavir-ritonavir (KALETRA) 200-50 MG  per tablet Take 2 tablets by mouth 2 (two) times daily.      [name] LORazepam (ATIVAN) 1 MG tablet Take 1 mg by mouth daily. Take one tablet for seizure prevention.      . methocarbamol (ROBAXIN) 500 MG tablet Take 1,000 mg by mouth 2 (two) times daily. Take two 500 mg tablets by mouth four times daily      . metoprolol (LOPRESSOR) 50 MG tablet Take 50 mg by mouth 2 (two) times daily.      [name] nystatin (MYCOSTATIN) powder Apply 100,000 Bottles topically 4 (four) times daily as needed. Rash      . pantoprazole (PROTONIX) 40 MG tablet Take 40 mg by mouth daily.      [name] triamcinolone cream (KENALOG) 0.1 % Apply 1 application topically 2 (two) times daily as needed. For rash.        Allergies: Allergies as of 05/10/2013 - Review Complete 05/10/2013  Allergen Reaction Noted  . Norvasc [amlodipine besylate]   03/30/2013  . Morphine and related Hives, Itching, and Rash 02/15/2013   Past Medical History  Diagnosis Date  . Seizures   . Stroke   . Meningitis   . Diabetes mellitus without complication   . HIV (human immunodeficiency virus infection)   . Hypertension   . Gout   . Muscle spasms of head and/or neck    History reviewed. No pertinent past surgical history. Family History  Problem Relation Age of Onset  . Cancer - Colon Mother   . Cancer Father   . Diabetes    . Hypertension Father    History   Social History  . Marital Status: Single    Spouse Name: N/A    Number of Children: 4  . Years of Education: 11th   Occupational History  . Not on file.   Social History Main Topics  . Smoking status: Former Smoker    Types: Cigarettes  . Smokeless tobacco: Never Used  . Alcohol Use: No  . Drug Use: No     Comment: past history of alcohol, cocaine and IV drug use  . Sexual Activity: Not Currently    Partners: Male   Other Topics Concern  . Not on file   Social History Narrative   Patient lives at home with sister.    Patient is unemployed.    Patient is single.    Patient has 2 alive and 2 deceased.    Patient has 11th grade education.           Review of Systems: Could not be ascertained as pt could not communicate.  Physical Exam: Blood pressure 193/93, pulse 89, temperature 98.6 F (37 C), temperature source Oral, resp. rate 17, height 5\' 5"  (1.651 m), weight 183 lb (83.008 kg), SpO2 100.00%. GENERAL- confused, GCS- 12, appears as stated age. HEENT- Atraumatic, normocephalic, PERRL, oral mucosa appears moist, No carotid bruit, no cervical LN enlargement, thyroid does not appear enlarged. CARDIAC- RRR, no murmurs, rubs or gallops. RESP- Moving equal volumes of air, and clear to auscultation bilaterally. ABDOMEN- Soft, whinced to palpation on left side of abdomen, no palpable masses or organomegaly, bowel sounds present. NEURO- No facial droop, complete exam  could not be carried out as pt was confused and could not follow commands, but moved all her extremities, though strenght could not be ascertained. SKIN- Warm, dry, No rash or lesion.  Lab results: Basic Metabolic Panel:  Recent Labs  16/10/96 0845 05/10/13 1711  NA 138  --  K 4.2  --   CL 108  --   CO2 21  --   GLUCOSE 84  --   BUN 33*  --   CREATININE 1.73*  --   CALCIUM 8.4  --   MG  --  1.6  PHOS  --  5.3*   Liver Function Tests:  Recent Labs  05/10/13 1711  AST 53*  ALT 50*  ALKPHOS 166*  BILITOT 0.5  PROT 7.9  ALBUMIN 2.3*   No results found for this basename: LIPASE, AMYLASE,  in the last 72 hours No results found for this basename: AMMONIA,  in the last 72 hours CBC:  Recent Labs  05/10/13 0845  WBC 3.9*  NEUTROABS 1.9  HGB 8.7*  HCT 25.3*  MCV 98.8  PLT 200   Cardiac Enzymes: No results found for this basename: CKTOTAL, CKMB, CKMBINDEX, TROPONINI,  in the last 72 hours BNP: No results found for this basename: PROBNP,  in the last 72 hours D-Dimer: No results found for this basename: DDIMER,  in the last 72 hours CBG:  Recent Labs  05/10/13 1149 05/10/13 1741  GLUCAP 82 113*   Urine Drug Screen: Drugs of Abuse     Component Value Date/Time   LABOPIA NONE DETECTED 05/10/2013 1701   COCAINSCRNUR NONE DETECTED 05/10/2013 1701   LABBENZ NONE DETECTED 05/10/2013 1701   AMPHETMU NONE DETECTED 05/10/2013 1701   THCU NONE DETECTED 05/10/2013 1701   LABBARB NONE DETECTED 05/10/2013 1701    Alcohol Level: No results found for this basename: ETH,  in the last 72 hours Urinalysis:  Recent Labs  05/10/13 1808  COLORURINE YELLOW  LABSPEC 1.022  PHURINE 6.0  GLUCOSEU NEGATIVE  HGBUR MODERATE*  BILIRUBINUR NEGATIVE  KETONESUR NEGATIVE  PROTEINUR >300*  UROBILINOGEN 0.2  NITRITE NEGATIVE  LEUKOCYTESUR TRACE*   Imaging results:  Ct Head Wo Contrast  05/10/2013   CLINICAL DATA:  Headache and confusion.  EXAM: CT HEAD WITHOUT CONTRAST   TECHNIQUE: Contiguous axial images were obtained from the base of the skull through the vertex without intravenous contrast.  COMPARISON:  02/14/2013  FINDINGS: Prominence of the ventricles and sulci is compatible with mild generalized cerebral atrophy, advanced for age. Periventricular white matter hypodensities are compatible with mild chronic small vessel ischemic disease, unchanged. There is no evidence of acute cortical infarct, mass, midline shift, intracranial hemorrhage, or extra-axial fluid collection. Orbits are unremarkable. Visualized paranasal sinuses and mastoid air cells are clear. No evidence of acute fracture.  IMPRESSION: No evidence of acute intracranial abnormality.   Electronically Signed   By: Sebastian Ache   On: 05/10/2013 11:33   Portable Chest 1 View  05/10/2013   CLINICAL DATA:  Altered mental status  EXAM: PORTABLE CHEST - 1 VIEW  COMPARISON:  None.  FINDINGS: Cardiac shadow is within normal limits given the portable technique. The lungs are clear with exception of minimal platelike atelectasis in the left lateral lung base. No focal confluent infiltrate is seen. No bony abnormality is noted.  IMPRESSION: Minimal left basilar atelectasis.   Electronically Signed   By: Alcide Clever M.D.   On: 05/10/2013 17:36    Other results: EKG: Rate- ~70bmp, regular, sinus, PR interval- <0.2, QRS < 0.12, mild St depression-0.5 in leads II, III, and AVF, no T wave changes.  Assessment & Plan by Problem: Principal Problem:   Accelerated hypertension Active Problems:   Seizure   Postictal state   HIV disease   Normocytic anemia   CKD (chronic  kidney disease) stage 3, GFR 30-59 ml/min   CVA (cerebral vascular accident)   Migraines   Hypertensive emergency  Hypertensvie encephalopathy- Pt presented with headaches, had markedly elevated blood pressures, was vomiting and had altered mental status. As per home meds- pt is on Furosemide- 40mg  dly, lisionpril- 40mg  dly, metoprolol 50mg  BID.  Blood prerssure was markedly elevated in the ED, despite LV hydralazine X1 and IV labetalol- 20mg  and then 40mg . Compliance with blood pressure meds not known. - Started pt on nitroglycerin drip, with the goal of reducing pt blood presure- MAP, by 10% in the first hr. MAP calculated was 146. - PCCM was consulted, on the basis of Uncontrolled HTN, AMS, and diagnosis of HTN emergency, transfered to the unit. - INR, APTT- still in process.  HIV disease- Truvada and lopinavir-ritonavir, on med list, was to have her first HIV clinic visit on next week. Last viral load- <20, CD4 count- 470. No concern at this time for opportunistic infection causing intracranial infection. Afebrile, WBC- 3.9, though not reliable considering HIV status.   Seizure, and Post-ictal state- Possible pt is in a post-ictal state.,though no witnessed seizure in the ED, which is where AMS began. Home meds- levitiracetam. Phenytoin not on med list, though levels checked- today and were subtherapeutic.  CKD- Stage 3- Cr- 1.73, K- 4.2. Avoid NSAIDS. Watch Cr levels after contrast exposure.  Normocytic Anemia- HB- 8.7, 2 months ago- 10.3.  Dispo: Disposition is deferred at this time, awaiting improvement of current medical problems.  The patient does not know have a current PCP (No Pcp Per Patient) and does not know need an [facility] hospital follow-up appointment after discharge.  The patient does not know have transportation limitations that hinder transportation to clinic appointments.  Signed: Kennis Carina, MD 05/10/2013, 7:13 PM

## 2013-05-10 NOTE — ED Notes (Signed)
Pt stated "I gotta go" and was able to mildly assist with female urinal.  Pt able to follow commands and starting to speak, though words are often garbled.

## 2013-05-10 NOTE — ED Notes (Signed)
Rcvd call from flow stating pt is not stable enough for step-down.  Dr Meredith Pel notified.

## 2013-05-10 NOTE — ED Notes (Signed)
Dr Bernette Mayers was asking pt who she lived with and pt could not answer.

## 2013-05-10 NOTE — ED Notes (Signed)
Dr Bernette Mayers notified of pt bp of 228/87

## 2013-05-10 NOTE — ED Notes (Signed)
Internal medicine at bedside.  Notified that pt is increasingly verbal.  Able to speak in complete phrases, but unable to complete her thoughts.  Notified that pt just vomited bile and pressure is elevating.  Pt continues to be unable to state her name or locate where her pain is.

## 2013-05-10 NOTE — ED Notes (Signed)
Keppra and Labetalol drips not started d/t pt leaving for MRI.  MRI is aware that pt is hypertensive and altered and will call if any changes.  ED staff will take pt upstairs when MRI is complete.Elouise Munroe [phone] has ALL pt belongings (including bag, glasses, phone and charger).

## 2013-05-10 NOTE — ED Provider Notes (Signed)
CSN: [id number]     Arrival date & time 05/10/13  4098 History   First MD Initiated Contact with Patient 05/10/13 0725     Chief Complaint  Patient presents with  . Headache  . Back Pain   (Consider location/radiation/quality/duration/timing/severity/associated sxs/prior Treatment) Patient is a 52 y.o. female presenting with headaches and back pain.  Headache Associated symptoms: back pain   Back Pain Associated symptoms: headaches    Pt with history of HIV and HTN reports over a week of moderate to severe throbbing, central and L sided headache. She denies blurry vision, N/V. Has mild dizziness. Denies fever or URI symptoms. States she is compliant with meds but did not take morning dose today. She was previously cared for in [place], [place] and in the [place], [place] area but recently moved to [facility] and has not yet established with PCP or HIV clinic. She is scheduled for first HIV MD appointment next week. She was admitted to the hospital for seizure about 3 months ago, seen in followup at Neuro and continued on Dilantin and Keppra.   Pt has a secondary complaint of chronic low back pain, worse on the left, comes and goes, occasionally radiates down leg. Worse with movement.   Past Medical History  Diagnosis Date  . Seizures   . Stroke   . Meningitis   . Diabetes mellitus without complication   . HIV (human immunodeficiency virus infection)   . Hypertension   . Gout   . Muscle spasms of head and/or neck    History reviewed. No pertinent past surgical history. Family History  Problem Relation Age of Onset  . Cancer - Colon Mother   . Cancer Father   . Diabetes    . Hypertension Father    History  Substance Use Topics  . Smoking status: Former Smoker    Types: Cigarettes  . Smokeless tobacco: Never Used  . Alcohol Use: No   OB History   Grav Para Term Preterm Abortions TAB SAB Ect Mult Living                 Review of Systems  Musculoskeletal: Positive for back pain.   Neurological: Positive for headaches.   All other systems reviewed and are negative except as noted in HPI.   Allergies  Norvasc and Morphine and related  Home Medications   Current Outpatient Rx  Name  Route  Sig  Dispense  Refill  . acyclovir (ZOVIRAX) 800 MG tablet   Oral   Take 800 mg by mouth daily.         [name] aspirin EC 81 MG tablet   Oral   Take 81 mg by mouth daily.         . colchicine 0.6 MG tablet   Oral   Take 1 tablet (0.6 mg total) by mouth daily.   30 tablet   0   . diltiazem (DILACOR XR) 180 MG 24 hr capsule   Oral   Take 180 mg by mouth daily.         [name] emtricitabine-tenofovir (TRUVADA) 200-300 MG per tablet   Oral   Take 1 tablet by mouth daily.         . furosemide (LASIX) 40 MG tablet   Oral   Take 40 mg by mouth daily as needed for fluid.         [name] HYDROcodone-acetaminophen (NORCO) 10-325 MG per tablet   Oral   Take 1 tablet by mouth every 6 (six)  hours as needed for pain.   15 tablet   0   . HYDROcodone-acetaminophen (NORCO/VICODIN) 5-325 MG per tablet   Oral   Take 1 tablet by mouth every 6 (six) hours as needed.   10 tablet   0   . insulin aspart (NOVOLOG) 100 UNIT/ML injection   Subcutaneous   Inject 10-12 Units into the skin daily. Per sliding scale         . insulin glargine (LANTUS) 100 UNIT/ML injection   Subcutaneous   Inject 30 Units into the skin at bedtime.         . levETIRAcetam (KEPPRA) 500 MG tablet   Oral   Take 1,500 mg by mouth every 12 (twelve) hours.         [name] lisinopril (PRINIVIL,ZESTRIL) 40 MG tablet   Oral   Take 40 mg by mouth daily.         [name] lopinavir-ritonavir (KALETRA) 200-50 MG per tablet   Oral   Take 2 tablets by mouth 2 (two) times daily.         [name] LORazepam (ATIVAN) 1 MG tablet   Oral   Take 1 mg by mouth every 8 (eight) hours. Take one tablet for seizure prevention.         . methocarbamol (ROBAXIN) 500 MG tablet   Oral   Take 500 mg by mouth 4 (four) times daily.  Take two 500 mg tablets by mouth four times daily         . metoprolol (LOPRESSOR) 50 MG tablet   Oral   Take 50 mg by mouth 2 (two) times daily.         [name] nystatin (MYCOSTATIN) powder   Topical   Apply 100,000 Bottles topically 4 (four) times daily.         . pantoprazole (PROTONIX) 40 MG tablet   Oral   Take 40 mg by mouth daily.         . phenytoin (DILANTIN) 100 MG ER capsule   Oral   Take 1 capsule (100 mg total) by mouth 3 (three) times daily.   90 capsule   6   . potassium chloride SA (K-DUR,KLOR-CON) 20 MEQ tablet   Oral   Take 1 tablet (20 mEq total) by mouth daily.         [name] triamcinolone cream (KENALOG) 0.1 %   Topical   Apply 1 application topically 2 (two) times daily.          BP 218/111  Pulse 62  Temp(Src) 98.5 F (36.9 C) (Oral)  Resp 20  Ht 5\' 5"  (1.651 m)  Wt 183 lb (83.008 kg)  BMI 30.45 kg/m2  SpO2 100% Physical Exam  Nursing note and vitals reviewed. Constitutional: She is oriented to person, place, and time. She appears well-developed and well-nourished.  HENT:  Head: Normocephalic and atraumatic.  Eyes: EOM are normal. Pupils are equal, round, and reactive to light.  Neck: Normal range of motion. Neck supple.  Cardiovascular: Normal rate, normal heart sounds and intact distal pulses.   Pulmonary/Chest: Effort normal and breath sounds normal.  Abdominal: Bowel sounds are normal. She exhibits no distension. There is no tenderness.  Musculoskeletal: Normal range of motion. She exhibits tenderness (L paraspinal lumbar muscles). She exhibits no edema.  Neurological: She is alert and oriented to person, place, and time. She has normal strength. No cranial nerve deficit or sensory deficit.  Skin: Skin is warm and dry. No rash noted.  Psychiatric: She has  a normal mood and affect.    ED Course  Procedures (including critical care time) Labs Review Labs Reviewed  CBC WITH DIFFERENTIAL - Abnormal; Notable for the following:    WBC  3.9 (*)    RBC 2.56 (*)    Hemoglobin 8.7 (*)    HCT 25.3 (*)    All other components within normal limits  BASIC METABOLIC PANEL - Abnormal; Notable for the following:    BUN 33 (*)    Creatinine, Ser 1.73 (*)    GFR calc non Af Amer 32 (*)    GFR calc Af Amer 37 (*)    All other components within normal limits  PHENYTOIN LEVEL, TOTAL - Abnormal; Notable for the following:    Phenytoin Lvl <2.5 (*)    All other components within normal limits  GLUCOSE, CAPILLARY   Imaging Review Ct Head Wo Contrast  05/10/2013   CLINICAL DATA:  Headache and confusion.  EXAM: CT HEAD WITHOUT CONTRAST  TECHNIQUE: Contiguous axial images were obtained from the base of the skull through the vertex without intravenous contrast.  COMPARISON:  02/14/2013  FINDINGS: Prominence of the ventricles and sulci is compatible with mild generalized cerebral atrophy, advanced for age. Periventricular white matter hypodensities are compatible with mild chronic small vessel ischemic disease, unchanged. There is no evidence of acute cortical infarct, mass, midline shift, intracranial hemorrhage, or extra-axial fluid collection. Orbits are unremarkable. Visualized paranasal sinuses and mastoid air cells are clear. No evidence of acute fracture.  IMPRESSION: No evidence of acute intracranial abnormality.   Electronically Signed   By: Sebastian Ache   On: 05/10/2013 11:33    EKG Interpretation   None       MDM   1. Hypertension   2. Headache   3. Altered mental status      Pt initially neuro intact but markedly hypertensive. Given Hydralazine due to low HR but no improvement and so given Labetalol after HR came up with brief improvement. She began to have ?mental status changes, moving her arm around and talking strangely to nurse. No witnessed seizure activity but dilantin level is low. She was given Benadryl due to concerns it might have been due to Reglan, however no change. Sent for CT due to AMS but no acute process.  BP has increased again. No other signs of EOD, creatinine is actually improved from labs she had done last week. Discussed with [facility] resident who will see for admission.     Charles B. Bernette Mayers, MD 05/10/13 1239

## 2013-05-11 ENCOUNTER — Inpatient Hospital Stay (HOSPITAL_COMMUNITY): Payer: Medicaid Other

## 2013-05-11 DIAGNOSIS — R569 Unspecified convulsions: Secondary | ICD-10-CM

## 2013-05-11 DIAGNOSIS — R4182 Altered mental status, unspecified: Secondary | ICD-10-CM

## 2013-05-11 DIAGNOSIS — I10 Essential (primary) hypertension: Secondary | ICD-10-CM

## 2013-05-11 DIAGNOSIS — N189 Chronic kidney disease, unspecified: Secondary | ICD-10-CM

## 2013-05-11 DIAGNOSIS — E119 Type 2 diabetes mellitus without complications: Secondary | ICD-10-CM

## 2013-05-11 DIAGNOSIS — I6350 Cerebral infarction due to unspecified occlusion or stenosis of unspecified cerebral artery: Secondary | ICD-10-CM

## 2013-05-11 DIAGNOSIS — B20 Human immunodeficiency virus [HIV] disease: Secondary | ICD-10-CM

## 2013-05-11 LAB — CLOSTRIDIUM DIFFICILE BY PCR: Toxigenic C. Difficile by PCR: NEGATIVE

## 2013-05-11 LAB — GLUCOSE, CAPILLARY
Glucose-Capillary: 128 mg/dL — ABNORMAL HIGH (ref 70–99)
Glucose-Capillary: 144 mg/dL — ABNORMAL HIGH (ref 70–99)

## 2013-05-11 LAB — BASIC METABOLIC PANEL
CO2: 21 mEq/L (ref 19–32)
GFR calc Af Amer: 31 mL/min — ABNORMAL LOW (ref >90)
GFR calc non Af Amer: 26 mL/min — ABNORMAL LOW (ref >90)
Glucose, Bld: 145 mg/dL — ABNORMAL HIGH (ref 70–99)
Potassium: 4.5 mEq/L (ref 3.5–5.1)
Sodium: 142 mEq/L (ref 135–145)

## 2013-05-11 LAB — CBC
Hemoglobin: 8.5 g/dL — ABNORMAL LOW (ref 12.0–15.0)
MCHC: 33.3 g/dL (ref 30.0–36.0)
MCV: 101.2 fL — ABNORMAL HIGH (ref 78.0–100.0)
Platelets: 213 10*3/uL (ref 150–400)
RDW: 14 % (ref 11.5–15.5)

## 2013-05-11 MED ORDER — LISINOPRIL 40 MG PO TABS
40.0000 mg | ORAL_TABLET | Freq: Every day | ORAL | Status: DC
  Administered 2013-05-11 – 2013-05-16 (×6): 40 mg via ORAL
  Filled 2013-05-11 (×6): qty 1

## 2013-05-11 MED ORDER — METHOCARBAMOL 500 MG PO TABS
1000.0000 mg | ORAL_TABLET | Freq: Two times a day (BID) | ORAL | Status: DC
  Administered 2013-05-11 – 2013-05-16 (×11): 1000 mg via ORAL
  Filled 2013-05-11 (×13): qty 2

## 2013-05-11 MED ORDER — METOPROLOL TARTRATE 50 MG PO TABS
50.0000 mg | ORAL_TABLET | Freq: Two times a day (BID) | ORAL | Status: DC
  Administered 2013-05-11 (×2): 50 mg via ORAL
  Filled 2013-05-11 (×4): qty 1

## 2013-05-11 MED ORDER — HYDROCODONE-ACETAMINOPHEN 10-325 MG PO TABS
1.0000 | ORAL_TABLET | Freq: Four times a day (QID) | ORAL | Status: DC | PRN
  Administered 2013-05-11 – 2013-05-16 (×8): 1 via ORAL
  Filled 2013-05-11 (×8): qty 1

## 2013-05-11 MED ORDER — FUROSEMIDE 40 MG PO TABS
40.0000 mg | ORAL_TABLET | Freq: Every day | ORAL | Status: DC | PRN
  Filled 2013-05-11: qty 1

## 2013-05-11 MED ORDER — LORAZEPAM 1 MG PO TABS
1.0000 mg | ORAL_TABLET | Freq: Every day | ORAL | Status: DC
  Administered 2013-05-11 – 2013-05-16 (×6): 1 mg via ORAL
  Filled 2013-05-11 (×6): qty 1

## 2013-05-11 MED ORDER — ASPIRIN EC 81 MG PO TBEC
81.0000 mg | DELAYED_RELEASE_TABLET | Freq: Every day | ORAL | Status: DC
  Administered 2013-05-11 – 2013-05-16 (×6): 81 mg via ORAL
  Filled 2013-05-11 (×6): qty 1

## 2013-05-11 MED ORDER — EMTRICITABINE-TENOFOVIR DF 200-300 MG PO TABS
1.0000 | ORAL_TABLET | ORAL | Status: DC
  Administered 2013-05-11 – 2013-05-15 (×3): 1 via ORAL
  Filled 2013-05-11 (×3): qty 1

## 2013-05-11 MED ORDER — INSULIN ASPART 100 UNIT/ML ~~LOC~~ SOLN
0.0000 [IU] | Freq: Three times a day (TID) | SUBCUTANEOUS | Status: DC
Start: 2013-05-11 — End: 2013-05-16
  Administered 2013-05-12 (×2): 2 [IU] via SUBCUTANEOUS
  Administered 2013-05-12 – 2013-05-13 (×2): 3 [IU] via SUBCUTANEOUS
  Administered 2013-05-13 – 2013-05-14 (×2): 2 [IU] via SUBCUTANEOUS
  Administered 2013-05-14: 1 [IU] via SUBCUTANEOUS
  Administered 2013-05-14 – 2013-05-15 (×3): 3 [IU] via SUBCUTANEOUS
  Administered 2013-05-15: 2 [IU] via SUBCUTANEOUS
  Administered 2013-05-16: 3 [IU] via SUBCUTANEOUS

## 2013-05-11 MED ORDER — ACYCLOVIR 800 MG PO TABS
800.0000 mg | ORAL_TABLET | Freq: Every day | ORAL | Status: DC
  Administered 2013-05-11 – 2013-05-16 (×6): 800 mg via ORAL
  Filled 2013-05-11 (×7): qty 1

## 2013-05-11 MED ORDER — LOPINAVIR-RITONAVIR 200-50 MG PO TABS
2.0000 | ORAL_TABLET | Freq: Two times a day (BID) | ORAL | Status: DC
  Administered 2013-05-11 – 2013-05-16 (×11): 2 via ORAL
  Filled 2013-05-11 (×14): qty 2

## 2013-05-11 NOTE — H&P (Signed)
PULMONARY  / CRITICAL CARE MEDICINE  Name: Michelle [name] MRN: [id number] DOB: [birth year]    ADMISSION DATE:  05/10/2013 CONSULTATION DATE:  05/10/2013  REFERRING MD :  EDP PRIMARY SERVICE: PCCM   CHIEF COMPLAINT:  Hypertensive Emergency   BRIEF PATIENT DESCRIPTION: 52 y/o F admitted with hypertensive emergency and encephalopathy.   SIGNIFICANT EVENTS / STUDIES:  12/12  CT head >>> nad  LINES / TUBES: Foley 12/12 >>>  SUBJECTIVE:  She feels hungry.  VITAL SIGNS: Temp:  [98 F (36.7 C)-100.7 F (38.2 C)] 99 F (37.2 C) (12/13 0746) Pulse Rate:  [71-105] 71 (12/13 1100) Resp:  [15-38] 22 (12/13 1100) BP: (142-228)/(68-135) 169/78 mmHg (12/13 1100) SpO2:  [97 %-100 %] 99 % (12/13 1100) Weight:  [180 lb 12.4 oz (82 kg)] 180 lb 12.4 oz (82 kg) (12/12 2000) Room air  INTAKE / OUTPUT: Intake/Output     12/12 0701 - 12/13 0700 12/13 0701 - 12/14 0700   I.V. (mL/kg) 330 (4) 120 (1.5)   IV Piggyback 115    Total Intake(mL/kg) 445 (5.4) 120 (1.5)   Urine (mL/kg/hr) 1085 (0.6) 125 (0.4)   Total Output 1085 125   Net -640 -5         PHYSICAL EXAMINATION: General: Agitated at times Neuro: Has difficulty with word finding (apparently this is her baseline) HEENT:  No sinus tenderness Cardiovascular: regular Lungs: no wheeze Abdomen: soft, non tender Musculoskeletal:  No acute deformities Skin:  Warm/dry, no edema  LABS:  CBC  Recent Labs Lab 05/07/13 1009 05/10/13 0845 05/11/13 0525  WBC 4.7 3.9* 5.1  HGB 8.7* 8.7* 8.5*  HCT 25.9* 25.3* 25.5*  PLT 265 200 213   Coag's  Recent Labs Lab 05/10/13 2018  APTT 24  INR 0.98   BMET  Recent Labs Lab 05/07/13 1009 05/10/13 0845 05/11/13 0525  NA 138 138 142  K 3.9 4.2 4.5  CL 111 108 112  CO2 19 21 21   BUN 39* 33* 39*  CREATININE 2.36* 1.73* 2.04*  GLUCOSE 61* 84 145*   Electrolytes  Recent Labs Lab 05/07/13 1009 05/10/13 0845 05/10/13 1711 05/11/13 0525  CALCIUM 8.1* 8.4  --  7.9*  MG  --    --  1.6  --   PHOS  --   --  5.3*  --    Liver Enzymes  Recent Labs Lab 05/07/13 1009 05/10/13 1711  AST 76* 53*  ALT 61* 50*  ALKPHOS 176* 166*  BILITOT 0.3 0.5  ALBUMIN 2.6* 2.3*   Glucose  Recent Labs Lab 05/10/13 1149 05/10/13 1741 05/10/13 2008 05/11/13 0012 05/11/13 0349 05/11/13 0737  GLUCAP 82 113* 110* 144* 128* 128*   Imaging: Ct Head Wo Contrast  05/10/2013   CLINICAL DATA:  Headache and confusion.  EXAM: CT HEAD WITHOUT CONTRAST  TECHNIQUE: Contiguous axial images were obtained from the base of the skull through the vertex without intravenous contrast.  COMPARISON:  02/14/2013  FINDINGS: Prominence of the ventricles and sulci is compatible with mild generalized cerebral atrophy, advanced for age. Periventricular white matter hypodensities are compatible with mild chronic small vessel ischemic disease, unchanged. There is no evidence of acute cortical infarct, mass, midline shift, intracranial hemorrhage, or extra-axial fluid collection. Orbits are unremarkable. Visualized paranasal sinuses and mastoid air cells are clear. No evidence of acute fracture.  IMPRESSION: No evidence of acute intracranial abnormality.   Electronically Signed   By: Sebastian Ache   On: 05/10/2013 11:33   Mr Brain Wo Contrast  05/10/2013   CLINICAL DATA:  Altered mental status.  HIV  EXAM: MRI HEAD WITHOUT CONTRAST  TECHNIQUE: Multiplanar, multiecho pulse sequences of the brain and surrounding structures were obtained without intravenous contrast.  COMPARISON:  CT head 05/10/2013  FINDINGS: Moderate atrophy. Mild hyperintensity in the periventricular white matter bilaterally. Mild hyperintensity in the pons bilaterally.  Negative for acute infarct. Negative for hemorrhage or fluid collection negative for mass or edema.  Paranasal sinuses are clear.  IMPRESSION: Generalized atrophy. Mild chronic changes in the white matter and pons. No acute abnormality.   Electronically Signed   By: Marlan Palau  M.D.   On: 05/10/2013 19:19   Portable Chest 1 View  05/10/2013   CLINICAL DATA:  Altered mental status  EXAM: PORTABLE CHEST - 1 VIEW  COMPARISON:  None.  FINDINGS: Cardiac shadow is within normal limits given the portable technique. The lungs are clear with exception of minimal platelike atelectasis in the left lateral lung base. No focal confluent infiltrate is seen. No bony abnormality is noted.  IMPRESSION: Minimal left basilar atelectasis.   Electronically Signed   By: Alcide Clever M.D.   On: 05/10/2013 17:36     ASSESSMENT / PLAN:  PULMONARY A:   No active issues. P:   Goal SpO2>92 Supplemental oxygen PRN  CARDIOVASCULAR A:   Hypertensive emergency.  Intolerance of Ca channel blockers. P:  Goal BP<180/90 Labetalol gtt Resume ASA, Lisinopril, Metoprolol  RENAL A:   Renal failure (acute vs chronic?) P:   Trend BMP Resume lasix  GASTROINTESTINAL A:   Nutrition. P:   Carb modified diet Preadmission Protonix  HEMATOLOGIC A:   Anemia of chronic disease. P:  Trend CBC SCDs for DVT Px  INFECTIOUS A:   HIV. P:   Resume HAART  ENDOCRINE A:   DM type II. P:   SSI Hold Lantus  NEUROLOGIC A:   Acute encephalopathy in setting of hypertensive emergency.   H/o seizures. P:   Neuro checks Preadmission Keppra  CC time 35 minutes  Coralyn Helling, MD [facility] Pulmonary/Critical Care 05/11/2013, 11:23 AM Pager:  [phone] After 3pm call: [phone]

## 2013-05-11 NOTE — Progress Notes (Signed)
EEG completed; results pending.    

## 2013-05-11 NOTE — H&P (Signed)
This case was presented to me by my resident team and I agree with their assessment and plan as documented in Dr Fredirick Lathe note. The case has been taken over by the ICU team, and I am currently not following the case.

## 2013-05-12 LAB — BASIC METABOLIC PANEL
BUN: 40 mg/dL — ABNORMAL HIGH (ref 6–23)
CO2: 19 mEq/L (ref 19–32)
Chloride: 110 mEq/L (ref 96–112)
Creatinine, Ser: 2.08 mg/dL — ABNORMAL HIGH (ref 0.50–1.10)
GFR calc Af Amer: 30 mL/min — ABNORMAL LOW (ref >90)
Potassium: 4.3 mEq/L (ref 3.5–5.1)

## 2013-05-12 LAB — CBC
HCT: 25.3 % — ABNORMAL LOW (ref 36.0–46.0)
Hemoglobin: 8.4 g/dL — ABNORMAL LOW (ref 12.0–15.0)
MCH: 34.1 pg — ABNORMAL HIGH (ref 26.0–34.0)
MCV: 102.8 fL — ABNORMAL HIGH (ref 78.0–100.0)
RDW: 14 % (ref 11.5–15.5)
WBC: 4.1 10*3/uL (ref 4.0–10.5)

## 2013-05-12 LAB — URINE CULTURE: Colony Count: >=100000

## 2013-05-12 MED ORDER — LEVETIRACETAM 750 MG PO TABS
1500.0000 mg | ORAL_TABLET | Freq: Two times a day (BID) | ORAL | Status: DC
  Administered 2013-05-12 – 2013-05-16 (×8): 1500 mg via ORAL
  Filled 2013-05-12 (×10): qty 2

## 2013-05-12 MED ORDER — PHENYTOIN 50 MG PO CHEW
100.0000 mg | CHEWABLE_TABLET | Freq: Three times a day (TID) | ORAL | Status: DC
  Administered 2013-05-12 – 2013-05-16 (×13): 100 mg via ORAL
  Filled 2013-05-12 (×15): qty 2

## 2013-05-12 MED ORDER — METOPROLOL TARTRATE 100 MG PO TABS
100.0000 mg | ORAL_TABLET | Freq: Two times a day (BID) | ORAL | Status: DC
  Administered 2013-05-12 – 2013-05-15 (×5): 100 mg via ORAL
  Filled 2013-05-12 (×7): qty 1

## 2013-05-12 MED ORDER — LABETALOL HCL 5 MG/ML IV SOLN
10.0000 mg | INTRAVENOUS | Status: DC | PRN
  Administered 2013-05-12: 10 mg via INTRAVENOUS
  Filled 2013-05-12: qty 4

## 2013-05-12 MED ORDER — FUROSEMIDE 40 MG PO TABS
40.0000 mg | ORAL_TABLET | Freq: Every day | ORAL | Status: DC
  Administered 2013-05-12 – 2013-05-16 (×5): 40 mg via ORAL
  Filled 2013-05-12 (×5): qty 1

## 2013-05-12 NOTE — Procedures (Signed)
ELECTROENCEPHALOGRAM REPORT   Patient: [name]       Room #: 4U98 EEG No. ID: [id number] Age: 52 y.o.        Sex: female Referring Physician: [id number]  Report Date:  05/12/2013        Interpreting Physician: Aline Brochure  History: Michelle [name] is an 52 y.o. female a history of HIV, diabetes mellitus, hypertension and stroke, as well as seizure disorder, presenting with altered mental status with confusion.  Indications for study:  Assess severity of encephalopathy; rule out focal seizure activity.  Technique: This is an 18 channel routine scalp EEG performed at the bedside with bipolar and monopolar montages arranged in accordance to the international 10/20 system of electrode placement.   Description: CT recording was performed during wakefulness. Patient was noted to be confused during the recording. Predominant background activity consisted of low per tube 1-2 Hz irregular symmetrical delta activity with superimposed 7-8 Hz largely rhythmic activity most prominent posteriorly. A brief period of 9 Hz alpha rhythm was noted to patient was asked questions he technologist. Photic stimulation was not performed. Hyperventilation was not performed. No epileptiform discharges were recorded.  Interpretation: This EEG is abnormal with mild generalized nonspecific slowing of cerebral activity. This pattern of slowing can be seen with a wide variety of etiologies, including metabolic as well as degenerative disorders. No seizure activity was recorded.   Venetia Maxon M.D. [facility] [phone]

## 2013-05-12 NOTE — Progress Notes (Signed)
PULMONARY  / CRITICAL CARE MEDICINE  Name: Michelle [name] MRN: [id number] DOB: 06/01/1960    ADMISSION DATE:  05/10/2013 CONSULTATION DATE:  05/10/2013  REFERRING MD :  EDP PRIMARY SERVICE: PCCM   CHIEF COMPLAINT:  Hypertensive Emergency   BRIEF PATIENT DESCRIPTION: 52 y/o F admitted with hypertensive emergency and encephalopathy.   SIGNIFICANT EVENTS / STUDIES:  12/12  CT head >>> nad Jun 04, 2023 EEG >>   LINES / TUBES: Foley 12/12 >>>  SUBJECTIVE:  Denies chest pain.  VITAL SIGNS: Temp:  [97.9 F (36.6 C)-99.2 F (37.3 C)] 98.6 F (37 C) (12/14 1128) Pulse Rate:  [51-85] 61 (12/14 1130) Resp:  [15-28] 26 (12/14 1130) BP: (127-205)/(50-104) 195/81 mmHg (12/14 1130) SpO2:  [98 %-100 %] 98 % (12/14 1130) Weight:  [186 lb 4.6 oz (84.5 kg)] 186 lb 4.6 oz (84.5 kg) (12/14 0400) Room air  INTAKE / OUTPUT: Intake/Output     06-04-2023 0701 - 12/14 0700 12/14 0701 - 12/15 0700   P.O.  300   I.V. (mL/kg) 352.1 (4.2) 50.5 (0.6)   IV Piggyback 230    Total Intake(mL/kg) 582.1 (6.9) 350.5 (4.1)   Urine (mL/kg/hr) 2025 (1) 650 (1.5)   Total Output 2025 650   Net -1442.9 -299.5        Stool Occurrence 3 x     PHYSICAL EXAMINATION: General: No distress Neuro: More alert HEENT:  No sinus tenderness Cardiovascular: regular Lungs: no wheeze Abdomen: soft, non tender Musculoskeletal:  No acute deformities Skin:  Warm/dry, no edema  LABS:  CBC  Recent Labs Lab 05/10/13 0845 Jun 03, 2013 0525 05/12/13 0405  WBC 3.9* 5.1 4.1  HGB 8.7* 8.5* 8.4*  HCT 25.3* 25.5* 25.3*  PLT 200 213 192   Coag's  Recent Labs Lab 05/10/13 2018  APTT 24  INR 0.98   BMET  Recent Labs Lab 05/10/13 0845 2013/06/03 0525 05/12/13 0405  NA 138 142 139  K 4.2 4.5 4.3  CL 108 112 110  CO2 21 21 19   BUN 33* 39* 40*  CREATININE 1.73* 2.04* 2.08*  GLUCOSE 84 145* 118*   Electrolytes  Recent Labs Lab 05/10/13 0845 05/10/13 1711 2013/06/03 0525 05/12/13 0405  CALCIUM 8.4  --  7.9* 8.0*   MG  --  1.6  --   --   PHOS  --  5.3*  --   --    Liver Enzymes  Recent Labs Lab 05/07/13 1009 05/10/13 1711  AST 76* 53*  ALT 61* 50*  ALKPHOS 176* 166*  BILITOT 0.3 0.5  ALBUMIN 2.6* 2.3*   Glucose  Recent Labs Lab 06/03/2013 0349 June 03, 2013 0737 03-Jun-2013 1123 06-03-13 2206 05/12/13 0729 05/12/13 1121  GLUCAP 128* 128* 104* 132* 132* 163*   Imaging: Mr Brain Wo Contrast  05/10/2013   CLINICAL DATA:  Altered mental status.  HIV  EXAM: MRI HEAD WITHOUT CONTRAST  TECHNIQUE: Multiplanar, multiecho pulse sequences of the brain and surrounding structures were obtained without intravenous contrast.  COMPARISON:  CT head 05/10/2013  FINDINGS: Moderate atrophy. Mild hyperintensity in the periventricular white matter bilaterally. Mild hyperintensity in the pons bilaterally.  Negative for acute infarct. Negative for hemorrhage or fluid collection negative for mass or edema.  Paranasal sinuses are clear.  IMPRESSION: Generalized atrophy. Mild chronic changes in the white matter and pons. No acute abnormality.   Electronically Signed   By: Marlan Palau M.D.   On: 05/10/2013 19:19   Portable Chest 1 View  05/10/2013   CLINICAL DATA:  Altered mental  status  EXAM: PORTABLE CHEST - 1 VIEW  COMPARISON:  None.  FINDINGS: Cardiac shadow is within normal limits given the portable technique. The lungs are clear with exception of minimal platelike atelectasis in the left lateral lung base. No focal confluent infiltrate is seen. No bony abnormality is noted.  IMPRESSION: Minimal left basilar atelectasis.   Electronically Signed   By: Alcide Clever M.D.   On: 05/10/2013 17:36     ASSESSMENT / PLAN:  PULMONARY A:   No active issues. P:   Goal SpO2>92 Supplemental oxygen PRN  CARDIOVASCULAR A:   Hypertensive emergency.  Intolerance of Ca channel blockers. P:  Goal BP<180/90 Labetalol gtt Resumed ASA, Lisinopril Increased Metoprolol 12/14  RENAL A:   Renal failure (acute vs  chronic?) P:   Trend BMP Resume lasix 12/14  GASTROINTESTINAL A:   Nutrition. P:   Carb modified diet Preadmission Protonix  HEMATOLOGIC A:   Anemia of chronic disease. P:  Trend CBC SCDs for DVT Px  INFECTIOUS A:   HIV. P:   Resume HAART  ENDOCRINE A:   DM type II. P:   SSI Hold Lantus  NEUROLOGIC A:   Acute encephalopathy in setting of hypertensive emergency >> improved.   H/o seizures. P:   Neuro checks Preadmission Keppra Continue dilantin  Coralyn Helling, MD [facility] Pulmonary/Critical Care 05/12/2013, 12:07 PM Pager:  [phone] After 3pm call: [phone]

## 2013-05-13 DIAGNOSIS — M25539 Pain in unspecified wrist: Secondary | ICD-10-CM | POA: Diagnosis present

## 2013-05-13 LAB — GLUCOSE, CAPILLARY
Glucose-Capillary: 155 mg/dL — ABNORMAL HIGH (ref 70–99)
Glucose-Capillary: 118 mg/dL — ABNORMAL HIGH (ref 70–99)
Glucose-Capillary: 130 mg/dL — ABNORMAL HIGH (ref 70–99)
Glucose-Capillary: 93 mg/dL (ref 70–99)

## 2013-05-13 LAB — BASIC METABOLIC PANEL
CO2: 19 mEq/L (ref 19–32)
Chloride: 105 mEq/L (ref 96–112)
GFR calc non Af Amer: 26 mL/min — ABNORMAL LOW (ref >90)
Glucose, Bld: 149 mg/dL — ABNORMAL HIGH (ref 70–99)
Potassium: 4.1 mEq/L (ref 3.5–5.1)
Sodium: 135 mEq/L (ref 135–145)

## 2013-05-13 MED ORDER — HYDRALAZINE HCL 20 MG/ML IJ SOLN
10.0000 mg | INTRAMUSCULAR | Status: DC | PRN
  Administered 2013-05-16: 10 mg via INTRAVENOUS
  Filled 2013-05-13: qty 1

## 2013-05-13 MED ORDER — HYDRALAZINE HCL 20 MG/ML IJ SOLN
10.0000 mg | Freq: Once | INTRAMUSCULAR | Status: AC
  Administered 2013-05-13: 10 mg via INTRAVENOUS

## 2013-05-13 MED ORDER — HYDRALAZINE HCL 25 MG PO TABS
25.0000 mg | ORAL_TABLET | Freq: Four times a day (QID) | ORAL | Status: DC
  Administered 2013-05-13 – 2013-05-14 (×4): 25 mg via ORAL
  Filled 2013-05-13 (×9): qty 1

## 2013-05-13 MED ORDER — CIPROFLOXACIN HCL 250 MG PO TABS
250.0000 mg | ORAL_TABLET | Freq: Two times a day (BID) | ORAL | Status: DC
  Administered 2013-05-13: 250 mg via ORAL
  Filled 2013-05-13 (×3): qty 1

## 2013-05-13 MED ORDER — HYDRALAZINE HCL 20 MG/ML IJ SOLN
INTRAMUSCULAR | Status: AC
  Filled 2013-05-13: qty 1

## 2013-05-13 NOTE — Progress Notes (Addendum)
PULMONARY  / CRITICAL CARE MEDICINE  Name: Michelle [name] MRN: [id number] DOB: 1960-11-07    ADMISSION DATE:  05/10/2013 CONSULTATION DATE:  05/10/2013  REFERRING MD :  EDP PRIMARY SERVICE: PCCM   CHIEF COMPLAINT:  Hypertensive Emergency   BRIEF PATIENT DESCRIPTION: 52 y/o F admitted with hypertensive emergency and encephalopathy.   SIGNIFICANT EVENTS / STUDIES:  12/12  CT head >>> nad 05/20/23 EEG >> This EEG is abnormal with mild generalized nonspecific slowing of cerebral activity. This pattern of slowing can be seen with a wide variety of etiologies, including metabolic as well as degenerative disorders. No seizure activity was recorded.  LINES / TUBES: Foley 12/12 >>>  SUBJECTIVE:  More awake  VITAL SIGNS: Temp:  [97.4 F (36.3 C)-98.9 F (37.2 C)] 98.1 F (36.7 C) (12/15 1211) Pulse Rate:  [46-81] 56 (12/15 1200) Resp:  [13-25] 21 (12/15 1200) BP: (118-195)/(60-116) 140/72 mmHg (12/15 1200) SpO2:  [97 %-100 %] 99 % (12/15 1200) Weight:  [83.2 kg (183 lb 6.8 oz)] 83.2 kg (183 lb 6.8 oz) (12/15 0500) Room air  INTAKE / OUTPUT: Intake/Output     12/14 0701 - 12/15 0700 12/15 0701 - 12/16 0700   P.O. 420 150   I.V. (mL/kg) 551.9 (6.6)    IV Piggyback     Total Intake(mL/kg) 971.9 (11.7) 150 (1.8)   Urine (mL/kg/hr) 3295 (1.7) 905 (1.8)   Total Output 3295 905   Net -2323.1 -755         PHYSICAL EXAMINATION: General: No distress Neuro: awake, alert HEENT:  jvd wnl Cardiovascular: regular s1 s12  Lungs: cta Abdomen: soft, non tender Musculoskeletal:  No acute deformities Skin:  Warm/dry, no edema  LABS:  CBC  Recent Labs Lab 05/10/13 0845 May 19, 2013 0525 05/12/13 0405  WBC 3.9* 5.1 4.1  HGB 8.7* 8.5* 8.4*  HCT 25.3* 25.5* 25.3*  PLT 200 213 192   Coag's  Recent Labs Lab 05/10/13 2018  APTT 24  INR 0.98   BMET  Recent Labs Lab May 19, 2013 0525 05/12/13 0405 05/13/13 0433  NA 142 139 135  K 4.5 4.3 4.1  CL 112 110 105  CO2 21 19 19   BUN  39* 40* 37*  CREATININE 2.04* 2.08* 2.09*  GLUCOSE 145* 118* 149*   Electrolytes  Recent Labs Lab 05/10/13 0845 05/10/13 1711 05-19-13 0525 05/12/13 0405 05/13/13 0433  CALCIUM 8.4  --  7.9* 8.0* 8.2*  MG  --  1.6  --   --   --   PHOS  --  5.3*  --   --   --    Liver Enzymes  Recent Labs Lab 05/07/13 1009 05/10/13 1711  AST 76* 53*  ALT 61* 50*  ALKPHOS 176* 166*  BILITOT 0.3 0.5  ALBUMIN 2.6* 2.3*   Glucose  Recent Labs Lab 05/12/13 0729 05/12/13 1121 05/12/13 1653 05/12/13 2138 05/13/13 0737 05/13/13 1209  GLUCAP 132* 163* 132* 137* 155* 118*   Imaging: No results found.   ASSESSMENT / PLAN:  PULMONARY A:   No active issues. P:   Supplemental oxygen PRN  CARDIOVASCULAR A:   Hypertensive emergency.  Intolerance of Ca channel blockers [place] Foley P:  Goal BP<180/90 Labetalol gtt, off, brady as well Resumed ASA, Lisinopril Increased Metoprolol 12/14 Hydralazine addition iv and oral May need clonidine if hydralazine not effective  RENAL A:   Renal failure (acute vs chronic?) P:   Trend BMP lasix  GASTROINTESTINAL A:   Nutrition. P:   Carb modified diet Preadmission Protonix  HEMATOLOGIC A:   Anemia of chronic disease. P:  Trend CBC SCDs for DVT Px  INFECTIOUS A:   HIV. UTI likely P:   Resume HAART Add cipr, x 7 days thendc  ENDOCRINE A:   DM type II. P:   SSI Hold Lantus until diet taken in   NEUROLOGIC A:   Acute encephalopathy in setting of hypertensive emergency >> improved.   H/o seizures. P:   Neuro checks Preadmission Keppra Will d/w neuro , can we dc dilantin With HIV, low threshold LP, sees to be improving and cd4 490  To transfer floor, IMTS  Mcarthur Rossetti. Tyson Alias, MD, FACP Pgr: [phone] [place] Pulmonary & Critical [facility]  After 3pm call: [phone]

## 2013-05-13 NOTE — Progress Notes (Signed)
UR Completed Princella Jaskiewicz Graves-Bigelow, RN,BSN [phone]  

## 2013-05-13 NOTE — Progress Notes (Signed)
Chaplain offered emotional and spiritual support to pt, but pt said she has no needs at this time. She was grateful for visit. Chaplains available for follow up if needed or requested.   Maurene Capes, [place] [phone]

## 2013-05-13 NOTE — Progress Notes (Signed)
ICU TRANSFER NOTE  Interim History Patient is a 52 yo woman with h/o HTN, CKD3, HIV (well controlled), seizure, migraines, DM and HSV who was admitted by IMTS on 05/10/13 with HA, dizziness and AMS.  At that time, hx could not be obtained from the pt due to confusion, though she was responsive.  Due to hypertensive emergency/encephalopathy, she was transferred to the ICU for mgmt on labetalol gtt, which was d/c early this morning.    Head CT on 05/10/13 was unreveailing.  EEG on 05/11/13 was abnormal with mild generalized nonspecific slowing of cerebral activity. This pattern of slowing can be seen with a wide variety of etiologies, including metabolic as well as degenerative disorders. No seizure activity was recorded.  Today, she tells me that she is frequently admitted to the hospital with similar symptoms, though it is not entirely clear that she understands this admission was due to severely uncontrolled blood pressure.  She was most recently admitted to [facility] 02/14/13-02/16/13 for seizures and was supposed to establish herself with a neurologist, but I don't think she has done so. She reports compliance with all of her medications, and reports she is in the process of establishing care in [place], [place], as she was previously cared for in [place], [place] and [place], [place].    Subjective: Reports doing well, without chest pain, SOB, dizziness, HA.  Does c/o of left back muscular pain.   Objective: Vital signs in last 24 hours: Filed Vitals:   05/13/13 1200 05/13/13 1211 05/13/13 1300 05/13/13 1730  BP: 140/72  171/75 166/73  Pulse: 56  56 60  Temp:  98.1 F (36.7 C)  98.4 F (36.9 C)  TempSrc:  Oral  Oral  Resp: 21  21 18   Height:      Weight:      SpO2: 99%  98% 100%   Weight change: -2 lb 13.9 oz (-1.3 kg)  Intake/Output Summary (Last 24 hours) at 05/13/13 1834 Last data filed at 05/13/13 1140  Gross per 24 hour  Intake 566.13 ml  Output   2580 ml  Net -2013.87 ml    General: resting in bed, appears as stated age, in good spirits HEENT: PERRL, EOMI, no scleral icterus Cardiac: RRR, no rubs, murmurs or gallops Pulm: clear to auscultation bilaterally, moving normal volumes of air Abd: soft, nontender, nondistended, BS normoactive Ext: warm and well perfused, no pedal edema, left wrist with ROM limited by pain, +edema MSK: left lateral lumbar region tender to palpation Neuro: alert and oriented X3, cranial nerves II-XII grossly intact  Lab Results: Basic Metabolic Panel:  Recent Labs Lab 05/10/13 0845 05/10/13 1711  05/12/13 0405 05/13/13 0433  NA 138  --   < > 139 135  K 4.2  --   < > 4.3 4.1  CL 108  --   < > 110 105  CO2 21  --   < > 19 19  GLUCOSE 84  --   < > 118* 149*  BUN 33*  --   < > 40* 37*  CREATININE 1.73*  --   < > 2.08* 2.09*  CALCIUM 8.4  --   < > 8.0* 8.2*  MG  --  1.6  --   --   --   PHOS  --  5.3*  --   --   --   < > = values in this interval not displayed. Corrected Ca: 9.6  Liver Function Tests:  Recent Labs Lab 05/07/13 1009 05/10/13 1711  AST 76* 53*  ALT 61* 50*  ALKPHOS 176* 166*  BILITOT 0.3 0.5  PROT 7.4 7.9  ALBUMIN 2.6* 2.3*   CBC:  Recent Labs Lab 05/07/13 1009 05/10/13 0845 05/11/13 0525 05/12/13 0405  WBC 4.7 3.9* 5.1 4.1  NEUTROABS 2.3 1.9  --   --   HGB 8.7* 8.7* 8.5* 8.4*  HCT 25.9* 25.3* 25.5* 25.3*  MCV 97.7 98.8 101.2* 102.8*  PLT 265 200 213 192   CBG:  Recent Labs Lab 05/12/13 1121 05/12/13 1653 05/12/13 2138 05/13/13 0737 05/13/13 1209 05/13/13 1740  GLUCAP 163* 132* 137* 155* 118* 130*   Fasting Lipid Panel:  Recent Labs Lab 05/07/13 1009  CHOL 185  HDL 48  LDLCALC 88  TRIG 246*  CHOLHDL 3.9   Coagulation:  Recent Labs Lab 05/10/13 2018  LABPROT 12.8  INR 0.98   Urine Drug Screen: Drugs of Abuse     Component Value Date/Time   LABOPIA NONE DETECTED 05/10/2013 1701   COCAINSCRNUR NONE DETECTED 05/10/2013 1701   LABBENZ NONE DETECTED 05/10/2013  1701   AMPHETMU NONE DETECTED 05/10/2013 1701   THCU NONE DETECTED 05/10/2013 1701   LABBARB NONE DETECTED 05/10/2013 1701    Urinalysis:  Recent Labs Lab 05/07/13 1017 05/10/13 1808  COLORURINE YELLOW YELLOW  LABSPEC 1.024 1.022  PHURINE 6.0 6.0  GLUCOSEU NEG NEGATIVE  HGBUR MOD* MODERATE*  BILIRUBINUR NEG NEGATIVE  KETONESUR NEG NEGATIVE  PROTEINUR > 300* >300*  UROBILINOGEN 0.2 0.2  NITRITE NEG NEGATIVE  LEUKOCYTESUR TRACE* TRACE*   Micro Results: Recent Results (from the past 240 hour(s))  URINE CULTURE     Status: None   Collection Time    05/10/13  6:08 PM      Result Value Range Status   Specimen Description URINE, CLEAN CATCH   Final   Special Requests NONE   Final   Culture  Setup Time     Final   Value: 05/10/2013 19:43     Performed at [facility] Count     Final   Value: >=100,000 COLONIES/ML     Performed at [facility]   Culture     Final   Value: ESCHERICHIA COLI     Performed at [facility]   Report Status 05/12/2013 FINAL   Final   Organism ID, Bacteria ESCHERICHIA COLI   Final  MRSA PCR SCREENING     Status: None   Collection Time    05/10/13  7:36 PM      Result Value Range Status   MRSA by PCR NEGATIVE  NEGATIVE Final   Comment:            The GeneXpert MRSA Assay (FDA     approved for NASAL specimens     only), is one component of a     comprehensive MRSA colonization     surveillance program. It is not     intended to diagnose MRSA     infection nor to guide or     monitor treatment for     MRSA infections.  CLOSTRIDIUM DIFFICILE BY PCR     Status: None   Collection Time    05/11/13  2:03 PM      Result Value Range Status   C difficile by pcr NEGATIVE  NEGATIVE Final   Medications: I have reviewed the patient's current medications. Scheduled Meds: . acyclovir  800 mg Oral Daily  . aspirin EC  81 mg Oral Daily  .  ciprofloxacin  250 mg Oral BID  . emtricitabine-tenofovir  1 tablet Oral QODAY    . furosemide  40 mg Oral Daily  . hydrALAZINE  25 mg Oral Q6H  . insulin aspart  0-15 Units Subcutaneous TID WC  . levETIRAcetam  1,500 mg Oral BID  . lisinopril  40 mg Oral Daily  . lopinavir-ritonavir  2 tablet Oral BID  . LORazepam  1 mg Oral Daily  . methocarbamol  1,000 mg Oral BID  . metoprolol  100 mg Oral BID  . pantoprazole (PROTONIX) IV  40 mg Intravenous Q24H  . phenytoin  100 mg Oral TID   Continuous Infusions: . labetalol (NORMODYNE) infusion Stopped (05/13/13 0500)   PRN Meds:.hydrALAZINE, HYDROcodone-acetaminophen, labetalol   Assessment/Plan: 52 yo F admitted on 05/10/13 with HA/dizziness found to be in hypertensive encephalopathy and transferred to the ICU is being transferred back to IMTS - IMTS to resume care on 05/14/13.  #Hypertensive Emergency: Presented as hypertensive encephalopathy, CT unrevealing and MRI without acute changes (generalized atrophy with mild chronic white matter changes); s/p ICU mgmt on labetalol drip.  CXR at admit and symptoms not concerning for CHF/cardiac stress.  Pressures better controlled. Pt to benefit from education regarding home medications.  She is intolerant to CCB. -Continue Lasix 40mg  daily, Hydralazine 25mg  q6h, Lisinopril 40mg , Metoprolol 100mg  BID -Hydralazine IV prn  #Left Wrist Arthralgia: with edema and decreased ROM on exam, consider ortho consult for aspiration as she reports compliance with colchicine without significant improvement (was seen at Urgent care for gout on 04/22/13  #Renal Failure (?CKD 3): Cr seems to have plateaued, and was up to 2.36 at worst, down to 2.09 today; likely secondary to uncontrolled HTN vs HIV vs DM -Cont lasix, monitor as needed  #Macrocytic Anemia: Baseline? Likely of chronic disease in the setting of likely CKD; Hb was 10.5 as of 02/14/13, but has been stable ~8.5 during this admission; no s/s of active bleeding; as of 03/12/13, B12 wnl; no ferritin or folate on file -Monitor as needed,  check anemia panel -Colonoscopy outpatient  #Seizure: history of seizure with recent admission in 01/2013, abnormal EKG -Cont Keppra 1500mg  BID -Will need to confirm d/c dilantin with neuro, for now, dilantin has been continued  #Bacteruria: Patient denies problems with urination. Urine cx with >100,000 ecoli. Given that she is asymptomatic, I will d/c abx for now.  #DM: check A1c given patient is going to est care in ID clinic +/[facility]; home regimen includes lantus 30 qHS and aspart 10-12u TIDWC -Cont SSI -follow A1c  #HIV: CD4 490, VL <20 -Cont HAART tx   #VTE ppx: SCDs   Dispo: Disposition is deferred at this time, awaiting improvement of current medical problems.  Anticipated discharge in approximately 1-2 day(s).   The patient does not have a current PCP (No Pcp Per Patient) and does need an [facility] hospital follow-up appointment after discharge.  The patient does not have transportation limitations that hinder transportation to clinic appointments.  .Services Needed at time of discharge: Y = Yes, Blank = No PT:   OT:   RN:   Equipment:   Other:     LOS: 3 days   Belia Heman, MD 05/13/2013, 6:34 PM

## 2013-05-13 NOTE — Care Management Note (Unsigned)
    Page 1 of 1   05/15/2013     3:23:13 PM   CARE MANAGEMENT NOTE 05/15/2013  Patient:  Michelle [name], Michelle [name]   Account Number:  [id number]  Date Initiated:  05/13/2013  Documentation initiated by:  GRAVES-BIGELOW,Edia Pursifull  Subjective/Objective Assessment:   Pt admitted for Hypertensive Emergency.     Action/Plan:   CM to monitor for disposition needs.   Anticipated DC Date:  05/15/2013   Anticipated DC Plan:  HOME W HOME HEALTH SERVICES      DC Planning Services  CM consult      Choice offered to / List presented to:             Status of service:  In process, will continue to follow Medicare Important Message given?   (If response is "NO", the following Medicare IM given date fields will be blank) Date Medicare IM given:   Date Additional Medicare IM given:    Discharge Disposition:    Per UR Regulation:  Reviewed for med. necessity/level of care/duration of stay  If discussed at Long Length of Stay Meetings, dates discussed:   05/16/2013    Comments:  05-15-13 MD still tweaking bp meds. Plan for home per MD notes 05-15-13. Gerome Apley [phone]

## 2013-05-14 ENCOUNTER — Inpatient Hospital Stay (HOSPITAL_COMMUNITY): Payer: Medicaid Other

## 2013-05-14 DIAGNOSIS — I129 Hypertensive chronic kidney disease with stage 1 through stage 4 chronic kidney disease, or unspecified chronic kidney disease: Secondary | ICD-10-CM

## 2013-05-14 DIAGNOSIS — M109 Gout, unspecified: Secondary | ICD-10-CM

## 2013-05-14 LAB — BASIC METABOLIC PANEL
BUN: 37 mg/dL — ABNORMAL HIGH (ref 6–23)
CO2: 17 mEq/L — ABNORMAL LOW (ref 19–32)
Chloride: 102 mEq/L (ref 96–112)
Creatinine, Ser: 1.92 mg/dL — ABNORMAL HIGH (ref 0.50–1.10)
GFR calc Af Amer: 33 mL/min — ABNORMAL LOW (ref >90)
Glucose, Bld: 117 mg/dL — ABNORMAL HIGH (ref 70–99)
Potassium: 4.1 mEq/L (ref 3.5–5.1)
Sodium: 134 mEq/L — ABNORMAL LOW (ref 135–145)

## 2013-05-14 LAB — CBC
HCT: 28.4 % — ABNORMAL LOW (ref 36.0–46.0)
Hemoglobin: 9.5 g/dL — ABNORMAL LOW (ref 12.0–15.0)
MCH: 33.9 pg (ref 26.0–34.0)
MCHC: 33.5 g/dL (ref 30.0–36.0)
MCV: 101.4 fL — ABNORMAL HIGH (ref 78.0–100.0)

## 2013-05-14 LAB — FERRITIN: Ferritin: 291 ng/mL (ref 10–291)

## 2013-05-14 LAB — FOLATE: Folate: 10.4 ng/mL

## 2013-05-14 LAB — RETICULOCYTES
RBC.: 2.54 MIL/uL — ABNORMAL LOW (ref 3.87–5.11)
Retic Count, Absolute: 66 10*3/uL (ref 19.0–186.0)

## 2013-05-14 LAB — VITAMIN B12: Vitamin B-12: 488 pg/mL (ref 211–911)

## 2013-05-14 LAB — GLUCOSE, CAPILLARY
Glucose-Capillary: 153 mg/dL — ABNORMAL HIGH (ref 70–99)
Glucose-Capillary: 163 mg/dL — ABNORMAL HIGH (ref 70–99)
Glucose-Capillary: 173 mg/dL — ABNORMAL HIGH (ref 70–99)

## 2013-05-14 LAB — IRON AND TIBC: UIBC: 211 ug/dL (ref 125–400)

## 2013-05-14 MED ORDER — HYDRALAZINE HCL 50 MG PO TABS
50.0000 mg | ORAL_TABLET | Freq: Four times a day (QID) | ORAL | Status: DC
  Administered 2013-05-14 – 2013-05-15 (×5): 50 mg via ORAL
  Filled 2013-05-14 (×8): qty 1

## 2013-05-14 MED ORDER — DIPHENHYDRAMINE HCL 25 MG PO CAPS
25.0000 mg | ORAL_CAPSULE | Freq: Once | ORAL | Status: AC
  Administered 2013-05-14: 25 mg via ORAL
  Filled 2013-05-14: qty 1

## 2013-05-14 MED ORDER — PREDNISONE 20 MG PO TABS
20.0000 mg | ORAL_TABLET | Freq: Every day | ORAL | Status: DC
  Administered 2013-05-14 – 2013-05-16 (×3): 20 mg via ORAL
  Filled 2013-05-14 (×4): qty 1

## 2013-05-14 NOTE — Progress Notes (Signed)
Subjective: No complaints today. Sitting on the bed. Comfortable. Communicative. Much improvement in mental status compared to on admission. Complaints of pain in her Right wrist with mild swelling, started 2 days ago, no previous episodes of pain at the same joint, but has had similar pain in ankles. Also some mild tenderness on her Left medial epidicondyle, no swelling or erythema. Was on colchicine for gout, which helped reduce the pain significantly.  Objective: Vital signs in last 24 hours: Filed Vitals:   05/14/13 0000 05/14/13 0339 05/14/13 0954 05/14/13 1256  BP: 150/64 169/75 180/77 186/84  Pulse: 65 60 69   Temp: 99.2 F (37.3 C) 98.8 F (37.1 C)    TempSrc: Oral Oral    Resp: 18 18    Height:      Weight:  180 lb 14.4 oz (82.056 kg)    SpO2: 98% 99%     Weight change: -2 lb 8.4 oz (-1.144 kg)  Intake/Output Summary (Last 24 hours) at 05/14/13 1328 Last data filed at 05/14/13 0800  Gross per 24 hour  Intake    360 ml  Output      0 ml  Net    360 ml   General appearance: alert, cooperative, appears stated age and no distress Eyes: conjunctivae/corneas clear. PERRL, EOM's intact. Throat: lips, mucosa, and tongue normal; teeth and gums normal Lungs: clear to auscultation bilaterally. Heart: regular rate and rhythm, S1, S2 normal, no murmur, click, rub or gallop Abdomen: normal findings: bowel sounds normal. Soft, non-tender. Extremities:  Right wrist- mild swelling, not erythematous, range of motion limited by pain, Left wrist is normal, pulse present- 2+ DP and Radial. No cyanosis or edema Neurologic- Oriented to time, place and person, No obvious Cr N abnormality.  Lab Results: Basic Metabolic Panel:  Recent Labs Lab 05/10/13 0845 05/10/13 1711  05/12/13 0405 05/13/13 0433  NA 138  --   < > 139 135  K 4.2  --   < > 4.3 4.1  CL 108  --   < > 110 105  CO2 21  --   < > 19 19  GLUCOSE 84  --   < > 118* 149*  BUN 33*  --   < > 40* 37*  CREATININE 1.73*  --    < > 2.08* 2.09*  CALCIUM 8.4  --   < > 8.0* 8.2*  MG  --  1.6  --   --   --   PHOS  --  5.3*  --   --   --   < > = values in this interval not displayed. Liver Function Tests:  Recent Labs Lab 05/10/13 1711  AST 53*  ALT 50*  ALKPHOS 166*  BILITOT 0.5  PROT 7.9  ALBUMIN 2.3*   CBC:  Recent Labs Lab 05/10/13 0845  05/12/13 0405 05/14/13 1150  WBC 3.9*  < > 4.1 5.0  NEUTROABS 1.9  --   --   --   HGB 8.7*  < > 8.4* 9.5*  HCT 25.3*  < > 25.3* 28.4*  MCV 98.8  < > 102.8* 101.4*  PLT 200  < > 192 226  < > = values in this interval not displayed.  CBG:  Recent Labs Lab 05/13/13 0737 05/13/13 1209 05/13/13 1740 05/13/13 2104 05/14/13 0745 05/14/13 1123  GLUCAP 155* 118* 130* 93 139* 153*   Hemoglobin A1C:  Recent Labs Lab 05/14/13 0540  HGBA1C 5.0   Coagulation:  Recent Labs Lab 05/10/13 2018  LABPROT 12.8  INR 0.98   Anemia Panel:  Recent Labs Lab 05/14/13 0540  TIBC 251  IRON 40*  RETICCTPCT 2.6   Urine Drug Screen: Drugs of Abuse     Component Value Date/Time   LABOPIA NONE DETECTED 05/10/2013 1701   COCAINSCRNUR NONE DETECTED 05/10/2013 1701   LABBENZ NONE DETECTED 05/10/2013 1701   AMPHETMU NONE DETECTED 05/10/2013 1701   THCU NONE DETECTED 05/10/2013 1701   LABBARB NONE DETECTED 05/10/2013 1701    Alcohol Level: No results found for this basename: ETH,  in the last 168 hours Urinalysis:  Recent Labs Lab 05/10/13 1808  COLORURINE YELLOW  LABSPEC 1.022  PHURINE 6.0  GLUCOSEU NEGATIVE  HGBUR MODERATE*  BILIRUBINUR NEGATIVE  KETONESUR NEGATIVE  PROTEINUR >300*  UROBILINOGEN 0.2  NITRITE NEGATIVE  LEUKOCYTESUR TRACE*    Micro Results: Recent Results (from the past 240 hour(s))  URINE CULTURE     Status: None   Collection Time    05/10/13  6:08 PM      Result Value Range Status   Specimen Description URINE, CLEAN CATCH   Final   Special Requests NONE   Final   Culture  Setup Time     Final   Value: 05/10/2013 19:43       Performed at [facility] Count     Final   Value: >=100,000 COLONIES/ML     Performed at [facility]   Culture     Final   Value: ESCHERICHIA COLI     Performed at [facility]   Report Status 05/12/2013 FINAL   Final   Organism ID, Bacteria ESCHERICHIA COLI   Final  MRSA PCR SCREENING     Status: None   Collection Time    05/10/13  7:36 PM      Result Value Range Status   MRSA by PCR NEGATIVE  NEGATIVE Final   Comment:            The GeneXpert MRSA Assay (FDA     approved for NASAL specimens     only), is one component of a     comprehensive MRSA colonization     surveillance program. It is not     intended to diagnose MRSA     infection nor to guide or     monitor treatment for     MRSA infections.  CLOSTRIDIUM DIFFICILE BY PCR     Status: None   Collection Time    05/11/13  2:03 PM      Result Value Range Status   C difficile by pcr NEGATIVE  NEGATIVE Final   Studies/Results: No results found. Medications: I have reviewed the patient's current medications. Scheduled Meds: . acyclovir  800 mg Oral Daily  . aspirin EC  81 mg Oral Daily  . emtricitabine-tenofovir  1 tablet Oral QODAY  . furosemide  40 mg Oral Daily  . hydrALAZINE  50 mg Oral Q6H  . insulin aspart  0-15 Units Subcutaneous TID WC  . levETIRAcetam  1,500 mg Oral BID  . lisinopril  40 mg Oral Daily  . lopinavir-ritonavir  2 tablet Oral BID  . LORazepam  1 mg Oral Daily  . methocarbamol  1,000 mg Oral BID  . metoprolol  100 mg Oral BID  . pantoprazole (PROTONIX) IV  40 mg Intravenous Q24H  . phenytoin  100 mg Oral TID  . predniSONE  20 mg Oral Q breakfast   Continuous Infusions:  PRN Meds:.hydrALAZINE,  HYDROcodone-acetaminophen Assessment/Plan: Principal Problem:   Hypertensive emergency Active Problems:   Seizure   Postictal state   HIV disease   Macrocytic anemia   CKD (chronic kidney disease) stage 3, GFR 30-59 ml/min   CVA (cerebral vascular  accident)   Migraines   Left Wrist arthralgia  #Hypertensive Emergency: With hypertensive encephalopathy, CT unrevealing and MRI without acute changes (generalized atrophy with mild chronic white matter changes); EEG- Showed slowing of cerebral activity. Was transferred to the floor form the ICU, pt was on a Labetalol drip, d/c yesterday. CXR at admit and symptoms not concerning for CHF. Currently on oral meds for BP- Lisionpril- 40mg  daily, Metoprolol 100mg  BID, Hydralazine 25mg  QID, and lasix- 40mg  dly. Home meds- Furosemide- 40mg  daily, Lisionpril- 40mg  daily, and Metoprolol- 50mg  BID. -Pt not tolerant of CCB. - Blood pressure today still elevated, at 186/84. There ia a concern for compliance with QID dosing of hydralazine. Asked pt if she will be able to comply with QID dosing and she says she will. Will therefore optimize hydralazine dose to 50 mg QID. -Continue Lasix 40mg  daily, Lisinopril 40mg , Metoprolol 100mg  BID  -Possible discharge tomorrow.   #Left Wrist Arthralgia: with edema and decreased ROM on exam. Presented to the urgent care for gout flare- 04/22/2013, and was given colchicine which pt says signif helped with the pain. Considering hx of gout flares, will commence Oral Prednisone at 20mg  daily for 5 days. - Xray of Right wrist today. - Follow up with ortho as an out-pt if persistent symptoms. No documentation of joint aspiration on the chart, and pt cannot recall ever having one done.   #Renal Failure (?CKD 3): Cr seems to have plateaued, and was up to 2.36 at worst, down to 2.09 today; likely secondary to uncontrolled HTN vs HIV vs DM  -Cont lasix, monitor as needed   #Macrocytic Anemia: Baseline unknown. Likely of chronic disease in the setting of likely CKD; Hb was 10.5 as of 02/14/13, but has been stable ~8.5 during this admission; no s/s of active bleeding; as of 03/12/13, B12 wnl; no ferritin or folate on file.  -Monitor as needed, check anemia panel  -Colonoscopy as an  outpatient   #Seizure: history of seizure with recent admission in 01/2013. Pt last seizure episode- 4 days ago- 05/10/2013. -Cont Keppra 1500mg  BID  -Will need to confirm d/c dilantin with neuro, for now, dilantin has been continued. - Neurology follow up to determine appropriate anti seizure regimen.   #Bacteruria: Patient denies problems with urination. Urine cx with >100,000 ecoli. Given that she is asymptomatic, antibiotics d/c for now.   #DM: HBA1c A1c- 5.0 , home regimen includes lantus 30 qHS and aspart 10-12u TIDWC.   -Cont SSI  - Reduce dose of anti-DM regimen on discharge as current HBA1c indicates pts blood glucose is too tightly controlled.  #HIV: CD4 490, VL <20  -Cont HAART tx   #VTE ppx: SCDs.  Dispo: Disposition is deferred at this time, awaiting improvement of current medical problems.  Anticipated discharge in approximately today.   The patient does not have a current PCP (No Pcp Per Patient) and does need an [facility] hospital follow-up appointment after discharge.  The patient does not know have transportation limitations that hinder transportation to clinic appointments.  .Services Needed at time of discharge: Y = Yes, Blank = No PT:   OT:   RN:   Equipment:   Other:     LOS: 4 days   Kennis Carina, MD 05/14/2013, 1:28 PM

## 2013-05-14 NOTE — Progress Notes (Signed)
Internal Medicine Attending  Date: 05/14/2013  Patient name: Michelle [name] Medical record number: [id number] Date of birth: July 18, 1960 Age: 52 y.o. Gender: female  I saw and evaluated the patient. I reviewed the resident's note by Dr. Mariea Clonts and I agree with the resident's findings and plans as documented in her note.

## 2013-05-15 LAB — GLUCOSE, CAPILLARY
Glucose-Capillary: 160 mg/dL — ABNORMAL HIGH (ref 70–99)
Glucose-Capillary: 180 mg/dL — ABNORMAL HIGH (ref 70–99)
Glucose-Capillary: 120 mg/dL — ABNORMAL HIGH (ref 70–99)

## 2013-05-15 MED ORDER — HYDRALAZINE HCL 50 MG PO TABS
50.0000 mg | ORAL_TABLET | Freq: Three times a day (TID) | ORAL | Status: DC
  Administered 2013-05-15 – 2013-05-16 (×4): 50 mg via ORAL
  Filled 2013-05-15 (×7): qty 1

## 2013-05-15 MED ORDER — LABETALOL HCL 100 MG PO TABS
100.0000 mg | ORAL_TABLET | Freq: Two times a day (BID) | ORAL | Status: DC
  Administered 2013-05-15 – 2013-05-16 (×2): 100 mg via ORAL
  Filled 2013-05-15 (×4): qty 1

## 2013-05-15 MED ORDER — PANTOPRAZOLE SODIUM 40 MG PO TBEC
40.0000 mg | DELAYED_RELEASE_TABLET | Freq: Every day | ORAL | Status: DC
  Administered 2013-05-15 – 2013-05-16 (×2): 40 mg via ORAL
  Filled 2013-05-15: qty 1

## 2013-05-15 MED ORDER — CLONIDINE HCL 0.1 MG PO TABS
0.1000 mg | ORAL_TABLET | ORAL | Status: AC
  Administered 2013-05-15: 0.1 mg via ORAL
  Filled 2013-05-15: qty 1

## 2013-05-15 MED ORDER — CLONIDINE HCL 0.1 MG PO TABS
0.1000 mg | ORAL_TABLET | Freq: Two times a day (BID) | ORAL | Status: DC
  Administered 2013-05-15 (×2): 0.1 mg via ORAL
  Filled 2013-05-15 (×5): qty 1

## 2013-05-15 NOTE — Progress Notes (Signed)
Subjective: In good spirits, no complaints today. Swelling in Right wrist has reduced in severity, mild puffiness present, but minimal pain on movement of her wrist. Not in any distress, no leg swelling or difficulty breathing.   Objective: Vital signs in last 24 hours: Filed Vitals:   05/15/13 0400 05/15/13 0636 05/15/13 1009 05/15/13 1216  BP: 174/74 175/86 160/86 187/89  Pulse: 54 66 76   Temp: 98.3 F (36.8 C)     TempSrc:      Resp: 16 16    Height:      Weight: 179 lb 3.7 oz (81.3 kg)     SpO2: 100% 100%     Weight change: -1 lb 10.7 oz (-0.756 kg)  Intake/Output Summary (Last 24 hours) at 05/15/13 1311 Last data filed at 05/15/13 0900  Gross per 24 hour  Intake    240 ml  Output      0 ml  Net    240 ml   General appearance: not in any distress, cooperative, in good spirits. Lungs: Moving equal volumes of air, clear to auscultation bilaterally. Heart: regular rate and rhythm, S1, S2 normal, no murmur, click, rub or gallop. Abdomen: soft, non tender, bowel sounds normal. Extremities:  Improvement in right wrist swelling and pain,increased range of motion, mildly tender, Left wrist is normal, pulse present- 2+ DP and Radial. No cyanosis or edema. Neurologic- Oriented to time, place and person, No obvious Cr N abnormality.  Lab Results: Basic Metabolic Panel:  Recent Labs Lab 05/10/13 0845 05/10/13 1711  05/13/13 0433 05/14/13 1150  NA 138  --   < > 135 134*  K 4.2  --   < > 4.1 4.1  CL 108  --   < > 105 102  CO2 21  --   < > 19 17*  GLUCOSE 84  --   < > 149* 117*  BUN 33*  --   < > 37* 37*  CREATININE 1.73*  --   < > 2.09* 1.92*  CALCIUM 8.4  --   < > 8.2* 8.3*  MG  --  1.6  --   --   --   PHOS  --  5.3*  --   --   --   < > = values in this interval not displayed. Liver Function Tests:  Recent Labs Lab 05/10/13 1711  AST 53*  ALT 50*  ALKPHOS 166*  BILITOT 0.5  PROT 7.9  ALBUMIN 2.3*   CBC:  Recent Labs Lab 05/10/13 0845  05/12/13 0405  05/14/13 1150  WBC 3.9*  < > 4.1 5.0  NEUTROABS 1.9  --   --   --   HGB 8.7*  < > 8.4* 9.5*  HCT 25.3*  < > 25.3* 28.4*  MCV 98.8  < > 102.8* 101.4*  PLT 200  < > 192 226  < > = values in this interval not displayed.  CBG:  Recent Labs Lab 05/14/13 0745 05/14/13 1123 05/14/13 1651 05/14/13 2056 05/15/13 0743 05/15/13 1125  GLUCAP 139* 153* 163* 173* 142* 160*   Hemoglobin A1C:  Recent Labs Lab 05/14/13 0540  HGBA1C 5.0   Coagulation:  Recent Labs Lab 05/10/13 2018  LABPROT 12.8  INR 0.98   Anemia Panel:  Recent Labs Lab 05/14/13 0540  VITAMINB12 488  FOLATE 10.4  FERRITIN 291  TIBC 251  IRON 40*  RETICCTPCT 2.6   Urine Drug Screen: Drugs of Abuse     Component Value Date/Time  LABOPIA NONE DETECTED 05/10/2013 1701   COCAINSCRNUR NONE DETECTED 05/10/2013 1701   LABBENZ NONE DETECTED 05/10/2013 1701   AMPHETMU NONE DETECTED 05/10/2013 1701   THCU NONE DETECTED 05/10/2013 1701   LABBARB NONE DETECTED 05/10/2013 1701    Urinalysis:  Recent Labs Lab 05/10/13 1808  COLORURINE YELLOW  LABSPEC 1.022  PHURINE 6.0  GLUCOSEU NEGATIVE  HGBUR MODERATE*  BILIRUBINUR NEGATIVE  KETONESUR NEGATIVE  PROTEINUR >300*  UROBILINOGEN 0.2  NITRITE NEGATIVE  LEUKOCYTESUR TRACE*    Micro Results: Recent Results (from the past 240 hour(s))  URINE CULTURE     Status: None   Collection Time    05/10/13  6:08 PM      Result Value Range Status   Specimen Description URINE, CLEAN CATCH   Final   Special Requests NONE   Final   Culture  Setup Time     Final   Value: 05/10/2013 19:43     Performed at [facility] Count     Final   Value: >=100,000 COLONIES/ML     Performed at [facility]   Culture     Final   Value: ESCHERICHIA COLI     Performed at [facility]   Report Status 05/12/2013 FINAL   Final   Organism ID, Bacteria ESCHERICHIA COLI   Final  MRSA PCR SCREENING     Status: None   Collection Time    05/10/13   7:36 PM      Result Value Range Status   MRSA by PCR NEGATIVE  NEGATIVE Final   Comment:            The GeneXpert MRSA Assay (FDA     approved for NASAL specimens     only), is one component of a     comprehensive MRSA colonization     surveillance program. It is not     intended to diagnose MRSA     infection nor to guide or     monitor treatment for     MRSA infections.  CLOSTRIDIUM DIFFICILE BY PCR     Status: None   Collection Time    05/11/13  2:03 PM      Result Value Range Status   C difficile by pcr NEGATIVE  NEGATIVE Final   Studies/Results: Dg Hand 2 View Right  05/14/2013   CLINICAL DATA:  Pain in right hand.  No known injury.  EXAM: RIGHT HAND - 2 VIEW  COMPARISON:  None  FINDINGS: Mild bony prominence from the radial base of the proximal phalanx of the thumb. This may reflect mild degenerative spurring or be related to remote trauma.  Remaining joints are normally space and aligned. There are no erosions.  No focal soft tissue swelling, but mild diffuse swelling is suggested. There is no soft tissue ossification or calcification.  IMPRESSION: No fracture or acute finding.  No erosions to suggest gout or other erosive arthropathy.  Mild nonspecific soft tissue swelling.   Electronically Signed   By: Amie Portland M.D.   On: 05/14/2013 16:27   Medications: I have reviewed the patient's current medications. Scheduled Meds: . acyclovir  800 mg Oral Daily  . aspirin EC  81 mg Oral Daily  . cloNIDine  0.1 mg Oral BID  . cloNIDine  0.1 mg Oral STAT  . emtricitabine-tenofovir  1 tablet Oral QODAY  . furosemide  40 mg Oral Daily  . hydrALAZINE  50 mg Oral Q6H  . insulin aspart  0-15 Units Subcutaneous TID WC  . levETIRAcetam  1,500 mg Oral BID  . lisinopril  40 mg Oral Daily  . lopinavir-ritonavir  2 tablet Oral BID  . LORazepam  1 mg Oral Daily  . methocarbamol  1,000 mg Oral BID  . metoprolol  100 mg Oral BID  . pantoprazole  40 mg Oral Daily  . phenytoin  100 mg Oral  TID  . predniSONE  20 mg Oral Q breakfast   Continuous Infusions:  PRN Meds:.hydrALAZINE, HYDROcodone-acetaminophen Assessment/Plan: Principal Problem:   Hypertensive emergency Active Problems:   Seizure   Postictal state   HIV disease   Macrocytic anemia   CKD (chronic kidney disease) stage 3, GFR 30-59 ml/min   CVA (cerebral vascular accident)   Migraines   Left Wrist arthralgia  #Hypertensive Emergency: With hypertensive encephalopathy, CT unrevealing and MRI without acute changes (generalized atrophy with mild chronic white matter changes); EEG- Showed slowing of cerebral activity. Blood pressure still elevated on- Lisionpril- 40mg  daily, Metoprolol 100mg  BID, Hydralazine 50mg  QID, and lasix- 40mg  dly and clonidine 0.1mg  BID. Initially pts home meds- Furosemide- 40mg  daily, Lisionpril- 40mg  daily, and Metoprolol- 50mg  BID. Pt not tolerant of CCB. Pt blood pressure has not responded to above meds- therefore hydralazine was increased yesterday to 50mg  QID, but this morning pt blood pressure is still not controlled. Clonidine was started this morning at 0.1mg  BID, with initial response but then became elevated again. - Blood pressure now- 169/83. - Medication adjustment today- Will d/c metoprolol, and commence Labetalol- 100mg  BID, Continue Lasix 40mg  daily, Lisinopril 40mg , Metoprolol 100mg  BID, Hydralazine 50mg  QID, clonidine-0.1mg  BID. If after evening dose blood pressure is still elevated, will increase clonidine to 0.2mg  BID. - Out-pt titration of medication and follow up with nephrology for CKD and blood pressure resistant to >3 meds. - Pending response of blood pressure consider discharge tomorrow, with close clinic followup.    #Left Wrist Arthralgia: Resolving. Says pain is similar to previous gout flares in her ankle. Presented to the urgent care for gout flare- 04/22/2013, and was given colchicine which pt says signif helped with the pain.  - Considering hx of gout flares, pt was  commenced on Oral Prednisone at 20mg  daily for 5 days, with good response.  - Xray of Right wrist today. - Follow up with ortho as an out-pt if persistent symptoms, and for joint aspiration for definitive diagnosis. No documentation of joint aspiration on the chart, and pt cannot recall ever having one done.   #Renal Failure (?CKD 3): Cr seems to have plateaued, and was up to 2.36 at worst, down to 1.92 today; likely secondary to uncontrolled HTN vs HIV vs DM  -Cont lasix, monitor as needed   #Macrocytic Anemia: Baseline unknown. Likely of chronic disease in the setting of likely CKD; MCV- 101.4, Hb- 9.5.  05/14/2013- Vit B12 wnl; folate WNL.   - Follow up as an out-pt, also screening colonoscopy.  #Seizure: history of seizure with recent admission in 01/2013. Pt last seizure episode- 5 days ago- 05/10/2013. -Cont Keppra 1500mg  BID  -Will need to confirm d/c dilantin with neuro, for now, dilantin has been continued. - Neurology follow up to determine appropriate anti seizure regimen.   #Bacteruria: Patient denies problems with urination. Urine cx with >100,000 ecoli. Given that she is asymptomatic, antibiotics d/c for now.   #DM: HBA1c A1c- 5.0 , home regimen includes lantus 30 qHS and aspart 10-12u TIDWC.   - Cont SSI  - Reduce dose of anti-DM  regimen on discharge as current HBA1c indicates pts blood glucose is too tightly controlled.  #HIV: CD4 490, VL <20  -Cont HAART tx   #VTE ppx: SCDs.  Dispo: Disposition is deferred at this time, awaiting improvement of current medical problems.  Anticipated discharge in approximately 1-2 days.   The patient does not have a current PCP (No Pcp Per Patient) and does need an [facility] hospital follow-up appointment after discharge.  The patient does not know have transportation limitations that hinder transportation to clinic appointments.  .Services Needed at time of discharge: Y = Yes, Blank = No PT:   OT:   RN:   Equipment:   Other:     LOS: 5  days   Kennis Carina, MD 05/15/2013, 1:11 PM

## 2013-05-15 NOTE — Progress Notes (Signed)
Internal Medicine Attending  Date: 05/15/2013  Patient name: Michelle [name] Medical record number: [id number] Date of birth: Oct 06, 1960 Age: 52 y.o. Gender: female  I saw and evaluated the patient, and discussed her care with house staff. I reviewed the resident's note by Dr. Mariea Clonts and I agree with the resident's findings and plans as documented in her note.

## 2013-05-16 ENCOUNTER — Ambulatory Visit: Payer: Medicaid Other | Admitting: Internal Medicine

## 2013-05-16 LAB — GLUCOSE, CAPILLARY: Glucose-Capillary: 84 mg/dL (ref 70–99)

## 2013-05-16 LAB — PHENYTOIN LEVEL, TOTAL: Phenytoin Lvl: <2.5 ug/mL — ABNORMAL LOW (ref 10.0–20.0)

## 2013-05-16 MED ORDER — PREDNISONE 20 MG PO TABS: 20.0000 mg | ORAL_TABLET | Freq: Every day | ORAL | Status: DC

## 2013-05-16 MED ORDER — CLONIDINE HCL 0.2 MG PO TABS
0.2000 mg | ORAL_TABLET | Freq: Two times a day (BID) | ORAL | Status: DC
  Administered 2013-05-16: 0.2 mg via ORAL
  Filled 2013-05-16 (×2): qty 1

## 2013-05-16 MED ORDER — CLONIDINE HCL 0.2 MG PO TABS: 0.2000 mg | ORAL_TABLET | Freq: Two times a day (BID) | ORAL | Status: DC

## 2013-05-16 MED ORDER — LISINOPRIL 40 MG PO TABS: 40.0000 mg | ORAL_TABLET | Freq: Every day | ORAL | Status: DC

## 2013-05-16 MED ORDER — HYDRALAZINE HCL 50 MG PO TABS: 50.0000 mg | ORAL_TABLET | Freq: Three times a day (TID) | ORAL | Status: DC

## 2013-05-16 MED ORDER — FUROSEMIDE 40 MG PO TABS: 40.0000 mg | ORAL_TABLET | Freq: Every day | ORAL | Status: DC

## 2013-05-16 MED ORDER — LABETALOL HCL 100 MG PO TABS: 100.0000 mg | ORAL_TABLET | Freq: Two times a day (BID) | ORAL | Status: DC

## 2013-05-16 MED ORDER — PHENYTOIN 50 MG PO CHEW: 100.0000 mg | CHEWABLE_TABLET | Freq: Three times a day (TID) | ORAL | Status: DC

## 2013-05-16 NOTE — Progress Notes (Signed)
Subjective: No complaints today. Undersatnds why we had to keep her here one more day to ensure her blood presure was well controlled. Worried about ID clinic appoitment. Pt had a bandage dressing on her Right wrist, say she hit her wrist against the table, but otherwise the wrist pain was improving prior to incident. No dizziness, no leg swelling.  Objective: Vital signs in last 24 hours: Filed Vitals:   05/15/13 2035 05/16/13 0000 05/16/13 0400 05/16/13 1049  BP: 183/86 158/74 176/87 152/60  Pulse: 74 73 62 70  Temp: 98 F (36.7 C) 98.2 F (36.8 C) 98.3 F (36.8 C)   TempSrc: Oral Oral Oral   Resp:  18 18   Height:      Weight:   176 lb 11.2 oz (80.151 kg)   SpO2: 100% 99% 100%    Weight change: -2 lb 8.5 oz (-1.149 kg)  Intake/Output Summary (Last 24 hours) at 05/16/13 1120 Last data filed at 05/16/13 0820  Gross per 24 hour  Intake    720 ml  Output      0 ml  Net    720 ml   General appearance: Alert, cooperative, pleasant. Lungs: No crackles, rubs, or wheezing. Heart: regular rate and rhythm, S1, S2 normal, no murmur, click, rub or gallop. Abdomen: soft, non tender, bowel sounds normal. Extremities:  Bandage dressing on Rt wrist, normal range of motion, Left wrist is normal, pulse present- 2+ DP and Radial. No cyanosis or edema. Neurologic- Oriented to time, place and person, No obvious Cr N abnormality.  Lab Results: Basic Metabolic Panel:  Recent Labs Lab 05/10/13 0845 05/10/13 1711  05/13/13 0433 05/14/13 1150  NA 138  --   < > 135 134*  K 4.2  --   < > 4.1 4.1  CL 108  --   < > 105 102  CO2 21  --   < > 19 17*  GLUCOSE 84  --   < > 149* 117*  BUN 33*  --   < > 37* 37*  CREATININE 1.73*  --   < > 2.09* 1.92*  CALCIUM 8.4  --   < > 8.2* 8.3*  MG  --  1.6  --   --   --   PHOS  --  5.3*  --   --   --   < > = values in this interval not displayed. Liver Function Tests:  Recent Labs Lab 05/10/13 1711  AST 53*  ALT 50*  ALKPHOS 166*  BILITOT 0.5    PROT 7.9  ALBUMIN 2.3*   CBC:  Recent Labs Lab 05/10/13 0845  05/12/13 0405 05/14/13 1150  WBC 3.9*  < > 4.1 5.0  NEUTROABS 1.9  --   --   --   HGB 8.7*  < > 8.4* 9.5*  HCT 25.3*  < > 25.3* 28.4*  MCV 98.8  < > 102.8* 101.4*  PLT 200  < > 192 226  < > = values in this interval not displayed.  CBG:  Recent Labs Lab 05/14/13 2056 05/15/13 0743 05/15/13 1125 05/15/13 1620 05/15/13 2041 05/16/13 0742  GLUCAP 173* 142* 160* 180* 120* 84   Hemoglobin A1C:  Recent Labs Lab 05/14/13 0540  HGBA1C 5.0   Coagulation:  Recent Labs Lab 05/10/13 2018  LABPROT 12.8  INR 0.98   Anemia Panel:  Recent Labs Lab 05/14/13 0540  VITAMINB12 488  FOLATE 10.4  FERRITIN 291  TIBC 251  IRON 40*  RETICCTPCT  2.6   Urine Drug Screen: Drugs of Abuse     Component Value Date/Time   LABOPIA NONE DETECTED 05/10/2013 1701   COCAINSCRNUR NONE DETECTED 05/10/2013 1701   LABBENZ NONE DETECTED 05/10/2013 1701   AMPHETMU NONE DETECTED 05/10/2013 1701   THCU NONE DETECTED 05/10/2013 1701   LABBARB NONE DETECTED 05/10/2013 1701    Urinalysis:  Recent Labs Lab 05/10/13 1808  COLORURINE YELLOW  LABSPEC 1.022  PHURINE 6.0  GLUCOSEU NEGATIVE  HGBUR MODERATE*  BILIRUBINUR NEGATIVE  KETONESUR NEGATIVE  PROTEINUR >300*  UROBILINOGEN 0.2  NITRITE NEGATIVE  LEUKOCYTESUR TRACE*    Micro Results: Recent Results (from the past 240 hour(s))  URINE CULTURE     Status: None   Collection Time    05/10/13  6:08 PM      Result Value Range Status   Specimen Description URINE, CLEAN CATCH   Final   Special Requests NONE   Final   Culture  Setup Time     Final   Value: 05/10/2013 19:43     Performed at [facility] Count     Final   Value: >=100,000 COLONIES/ML     Performed at [facility]   Culture     Final   Value: ESCHERICHIA COLI     Performed at [facility]   Report Status 05/12/2013 FINAL   Final   Organism ID, Bacteria ESCHERICHIA  COLI   Final  MRSA PCR SCREENING     Status: None   Collection Time    05/10/13  7:36 PM      Result Value Range Status   MRSA by PCR NEGATIVE  NEGATIVE Final   Comment:            The GeneXpert MRSA Assay (FDA     approved for NASAL specimens     only), is one component of a     comprehensive MRSA colonization     surveillance program. It is not     intended to diagnose MRSA     infection nor to guide or     monitor treatment for     MRSA infections.  CLOSTRIDIUM DIFFICILE BY PCR     Status: None   Collection Time    05/11/13  2:03 PM      Result Value Range Status   C difficile by pcr NEGATIVE  NEGATIVE Final   Studies/Results: Dg Hand 2 View Right  05/14/2013   CLINICAL DATA:  Pain in right hand.  No known injury.  EXAM: RIGHT HAND - 2 VIEW  COMPARISON:  None  FINDINGS: Mild bony prominence from the radial base of the proximal phalanx of the thumb. This may reflect mild degenerative spurring or be related to remote trauma.  Remaining joints are normally space and aligned. There are no erosions.  No focal soft tissue swelling, but mild diffuse swelling is suggested. There is no soft tissue ossification or calcification.  IMPRESSION: No fracture or acute finding.  No erosions to suggest gout or other erosive arthropathy.  Mild nonspecific soft tissue swelling.   Electronically Signed   By: Amie Portland M.D.   On: 05/14/2013 16:27   Medications: I have reviewed the patient's current medications. Scheduled Meds: . acyclovir  800 mg Oral Daily  . aspirin EC  81 mg Oral Daily  . cloNIDine  0.2 mg Oral BID  . emtricitabine-tenofovir  1 tablet Oral QODAY  . furosemide  40 mg Oral Daily  .  hydrALAZINE  50 mg Oral TID  . insulin aspart  0-15 Units Subcutaneous TID WC  . labetalol  100 mg Oral BID  . levETIRAcetam  1,500 mg Oral BID  . lisinopril  40 mg Oral Daily  . lopinavir-ritonavir  2 tablet Oral BID  . LORazepam  1 mg Oral Daily  . methocarbamol  1,000 mg Oral BID  .  pantoprazole  40 mg Oral Daily  . phenytoin  100 mg Oral TID  . predniSONE  20 mg Oral Q breakfast   Continuous Infusions:  PRN Meds:.hydrALAZINE, HYDROcodone-acetaminophen Assessment/Plan:  #Hypertensive Emergency: With hypertensive encephalopathy, CT unrevealing and MRI without acute changes (generalized atrophy with mild chronic white matter changes); EEG- Showed slowing of cerebral activity. Blood pressure now, better controlled today-152/60.  Current blood pressure regimen-  Lisinopril- 40mg  daily, Labetalol 100mg  BID, Hydralazine 50mg  QID, and lasix- 40mg  dly and clonidine 0.2mg  BID.  Pt not tolerant of CCB. - Out-pt titration of medication and follow up with nephrology for CKD and blood pressure resistant to >3 meds. - Discharge to day with close clinic follow up and follow up with ID clinic.    #Left Wrist Arthralgia: Resolving. Says pain is similar to previous gout flares in her ankle. Presented to the urgent care for gout flare- 04/22/2013, and was given colchicine which pt says signif helped with the pain. Xray of Right wrist today- No fracture or acute finding, No erosions to suggest gout or other erosive arthropathy. Mild nonspecific soft tissue swelling. -Considering hx of gout flares, will complete a 5 day course of prednisone at 20mg  daily for 5 days, with good response.  - Follow up with ortho as an out-pt if persistent symptoms, and for joint aspiration for definitive diagnosis. No documentation of joint aspiration on the chart, and pt cannot recall ever having one done.   #Renal Failure (?CKD 3): Last Cr- 1.92, 05/14/2013. Peaked at 2.36  05/07/2013. Has gradually decreased during this admission. Likely secondary to uncontrolled HTN vs HIV vs DM.  -Cont lasix, monitor as needed   #Macrocytic Anemia: Baseline unknown. Likely of chronic disease in the setting of CKD; MCV- 101.4, Hb- 9.5.  05/14/2013- Vit B12 wnl; folate WNL.   - Follow up as an out-pt, also screening  colonoscopy.  #Seizure: history of seizure with recent admission in 01/2013. Pt last seizure episode- 5 days ago- 05/10/2013. -Cont Keppra 1500mg  BID. -Neurology follow up to determine appropriate anti seizure regimen.   #Bacteruria: Patient denies problems with urination. Urine cx with >100,000 ecoli. Given that she is asymptomatic, antibiotics d/c for now.   #DM: HBA1c A1c- 5.0 , home regimen includes lantus 30 qHS and aspart 10-12u TIDWC.   - Pt has been on sliding scale insulin on this admission, but has been getting only 2-3 units of insulin. Considering HBA1c levels, will not continue with antiDM regimen for now. Will tell pt to come with glucometer to clinic to ascertain need for medication and dosage.  #HIV: CD4 490, VL <20  -Cont HAART tx  - Schedule out-pt follow up.  #VTE ppx: SCDs.  Dispo: Disposition is deferred at this time, awaiting improvement of current medical problems.  Anticipated discharge in approximately todays  The patient does not have a current PCP (No Pcp Per Patient) and does need an [facility] hospital follow-up appointment after discharge.  The patient does not know have transportation limitations that hinder transportation to clinic appointments.  .Services Needed at time of discharge: Y = Yes, Blank = No PT:  OT:   RN:   Equipment:   Other:     LOS: 6 days   Kennis Carina, MD 05/16/2013, 11:20 AM

## 2013-05-16 NOTE — Progress Notes (Signed)
Internal Medicine Attending  Date: 05/16/2013  Patient name: Michelle [name] Medical record number: [id number] Date of birth: 07-Jan-1961 Age: 52 y.o. Gender: female  I saw and evaluated the patient and discussed her care on a.m. rounds with house staff. I reviewed the resident's note by Dr. Mariea Clonts and I agree with the resident's findings and plans as documented in her note.

## 2013-05-17 ENCOUNTER — Other Ambulatory Visit: Payer: Self-pay | Admitting: Internal Medicine

## 2013-05-17 MED ORDER — BLOOD PRESSURE KIT: 1.0000 | PACK | Freq: Two times a day (BID) | Status: DC

## 2013-05-20 ENCOUNTER — Ambulatory Visit (INDEPENDENT_AMBULATORY_CARE_PROVIDER_SITE_OTHER): Payer: Medicaid Other | Admitting: Internal Medicine

## 2013-05-20 ENCOUNTER — Telehealth: Payer: Self-pay

## 2013-05-20 ENCOUNTER — Encounter: Payer: Self-pay | Admitting: Internal Medicine

## 2013-05-20 VITALS — BP 213/94 | HR 72 | Temp 98.1°F | Ht 66.0 in | Wt 199.8 lb

## 2013-05-20 DIAGNOSIS — M549 Dorsalgia, unspecified: Secondary | ICD-10-CM

## 2013-05-20 DIAGNOSIS — I674 Hypertensive encephalopathy: Secondary | ICD-10-CM

## 2013-05-20 DIAGNOSIS — I160 Hypertensive urgency: Secondary | ICD-10-CM

## 2013-05-20 DIAGNOSIS — I10 Essential (primary) hypertension: Secondary | ICD-10-CM

## 2013-05-20 DIAGNOSIS — E119 Type 2 diabetes mellitus without complications: Secondary | ICD-10-CM

## 2013-05-20 MED ORDER — HYDROCODONE-ACETAMINOPHEN 10-325 MG PO TABS: 1.0000 | ORAL_TABLET | Freq: Four times a day (QID) | ORAL | Status: DC | PRN

## 2013-05-20 NOTE — Telephone Encounter (Signed)
Patient called today to see if her appointment was at [facility].   She is scheduled with Internal Medicine at [facility] for hospital follow up and to establish primary physician.   She is very confused as to where she should go and which building her appointment is in.   After a few minutes of conversation I think she was able to figure out the difference in the appointments.   She will see Dr Drue Second for her initial HIV appointment on 05-21-13 and Internal Medicine today 05-20-13. Patient states she is a little confused after having the seizures in the hospital.   Laurell Josephs, RN

## 2013-05-21 ENCOUNTER — Encounter: Payer: Self-pay | Admitting: Internal Medicine

## 2013-05-21 ENCOUNTER — Ambulatory Visit (INDEPENDENT_AMBULATORY_CARE_PROVIDER_SITE_OTHER): Payer: Medicaid Other | Admitting: Dietician

## 2013-05-21 ENCOUNTER — Ambulatory Visit (INDEPENDENT_AMBULATORY_CARE_PROVIDER_SITE_OTHER): Payer: Medicaid Other | Admitting: Internal Medicine

## 2013-05-21 VITALS — BP 170/89 | HR 91 | Temp 98.4°F | Ht 65.5 in | Wt 198.5 lb

## 2013-05-21 DIAGNOSIS — I10 Essential (primary) hypertension: Secondary | ICD-10-CM

## 2013-05-21 DIAGNOSIS — E119 Type 2 diabetes mellitus without complications: Secondary | ICD-10-CM

## 2013-05-21 DIAGNOSIS — B20 Human immunodeficiency virus [HIV] disease: Secondary | ICD-10-CM

## 2013-05-21 DIAGNOSIS — B1920 Unspecified viral hepatitis C without hepatic coma: Secondary | ICD-10-CM

## 2013-05-21 DIAGNOSIS — I160 Hypertensive urgency: Secondary | ICD-10-CM | POA: Insufficient documentation

## 2013-05-21 LAB — BASIC METABOLIC PANEL WITH GFR
BUN: 36 mg/dL — ABNORMAL HIGH (ref 6–23)
CO2: 19 mEq/L (ref 19–32)
Chloride: 109 mEq/L (ref 96–112)
Glucose, Bld: 87 mg/dL (ref 70–99)
Potassium: 3.6 mEq/L (ref 3.5–5.3)

## 2013-05-21 LAB — HM DIABETES EYE EXAM

## 2013-05-21 NOTE — Progress Notes (Signed)
Retinal photos taken and transmitted. 

## 2013-05-21 NOTE — Progress Notes (Signed)
Subjective:    Patient ID: Michelle [name], female    DOB: [birth year], 52 y.o.   MRN: [id number]  HPI Michelle [name] is a 52yo woman with complicated pmh as listed below presents for hospital follow up. Pt was admitted and d/c on 05/16/13 for HTN encephalopathy that required ICU management. Pt does have low literacy but still manages all her medical issues and medications as best as she can. She reports being compliant with medications all of them with no trouble or bothersome side effects. She does complain of chronic back pain that was treated with some Norco from an urgent care center. But since she ran out she is unable to "bear this pain"  And bought some OTC pain medications that had caffiene in them and took 6 pills within the last 4 hrs (almost 2 pills q1hr) with little relief. During the interview patient described some left sided CP that is relieved with holding her breath and then returns intermittently. She also described ongoing HA that radiated down her spine during the interview. Immediate concern for return of HTN encephalopathy was discussed with patient and rest of interview terminated with discussion towards admission.   Pt surgical, pmh, social histories reviewed along with medications and changes made in chart.   Past Medical History  Diagnosis Date  . Seizures   . Stroke   . Meningitis   . Diabetes mellitus without complication   . HIV (human immunodeficiency virus infection)   . Hypertension   . Gout   . Muscle spasms of head and/or neck      Review of Systems  Constitutional: Negative for fever, chills, activity change and fatigue.  Respiratory: Positive for chest tightness and shortness of breath (intermittent associated with CP). Negative for cough.   Cardiovascular: Positive for chest pain. Negative for palpitations and leg swelling.  Gastrointestinal: Negative for nausea, vomiting, abdominal pain, diarrhea and constipation.  Musculoskeletal: Positive for back pain.  Negative for arthralgias and myalgias.  Neurological: Positive for weakness (nothing above baseline as uses a cane to ambulate) and headaches. Negative for dizziness, syncope and numbness.  Psychiatric/Behavioral: Positive for confusion. The patient is nervous/anxious.        Objective:   Physical Exam Filed Vitals:   05/20/13 1546  BP: 213/94  Pulse: 72  Temp: 98.1 F (36.7 C)   General: sitting in chair, some crying but then able to relax with reassurance  HEENT: PERRL, EOMI, no scleral icterus Cardiac: RRR, no rubs, murmurs or gallops Pulm: clear to auscultation bilaterally, no crackles, wheezes, or rhonchi, moving normal volumes of air Abd: soft, nontender, nondistended, BS present Ext: warm and well perfused, no pedal edema Neuro: alert and oriented X3, cranial nerves II-XII grossly intact    Assessment & Plan:  1. HTN urgency vs early HTN encephalopathy: Pt had markedly elevated BP during clinic visit similar to admission BP. Granted this is in the setting of recent significant ingestion of pain medication with significant caffeine content before clinic visit. Also pt had only been taking new BP meds for past 2 days and only BID as some meds required QID dosing therefore true reduction may not have been fully achieved yet. Pt was educated on the risks and benefits of being admitted for close monitoring given HA and elevated BP and intermittent CP that might be a result of significantly high BP and end organ damage with the risk of death. Pt was able to verbalize understanding and appeared to be fully competent to make  decisions and refused admission into the hospital.  -continue blood pressure medication: lisinopril 40mg  daily, labetalol 100 mg BID, hydralazine 50mg  QID (But pt only taking this BID), lasix 40mg  daily, and clonidine 0.2mg  BID -first available clinic follow up -pt was educated on the warning symptoms of not improving symptoms that would require IMMEDIATE evaluation  with ED (worsening HA, inability to urinate, blurry vision, seizure activity, worsening CP or SOB)  2. Back pain: pt was only given enough pills until next week for re-evaluation and that CP was not wanted to be masked and that she would need re-evalution  3. DM: pt had CBG tested during visit  Pt was seen and discussed with Dr. Josem Kaufmann

## 2013-05-21 NOTE — Assessment & Plan Note (Signed)
Pt had markedly elevated BP during clinic visit similar to admission BP. Granted this is in the setting of recent significant ingestion of pain medication with significant caffeine content before clinic visit. Also pt had only been taking new BP meds for past 2 days and only BID as some meds required QID dosing therefore true reduction may not have been fully achieved yet. Pt was educated on the risks and benefits of being admitted for close monitoring given HA and elevated BP and intermittent CP that might be a result of significantly high BP and end organ damage with the risk of death. Pt was able to verbalize understanding and appeared to be fully competent to make decisions and refused admission into the hospital.  -continue blood pressure medication: lisinopril 40mg  daily, labetalol 100 mg BID, hydralazine 50mg  QID (But pt only taking this BID), lasix 40mg  daily, and clonidine 0.2mg  BID -first available clinic follow up -pt was educated on the warning symptoms of not improving symptoms that would require IMMEDIATE evaluation with ED (worsening HA, inability to urinate, blurry vision, seizure activity, worsening CP or SOB)

## 2013-05-21 NOTE — Progress Notes (Signed)
Subjective:    Patient ID: Michelle [name], female    DOB: 04-24-1961, 52 y.o.   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 HTN, stroke with right sided hemiparesis. Cd 4 count of 490/VL<20. On kaletra/truvada daily. Recently admitted for hypertensive urgency, AKI, seizure. Prelim labs suggested truvada dosing needed to be changed to QOD.   She is here to establish care back into our clinic. She has been on truvada/kaletra for more than 10 years per her report. She also states that she had been on dilantin and keppra prior to the onset of her recent seizure. She reports having mammo in 2014.  Allergies  Allergen Reactions  . Norvasc [Amlodipine Besylate]     Itching, rash , hives .   [name] Morphine And Related Hives, Itching and Rash   Current Outpatient Prescriptions on File Prior to Visit  Medication Sig Dispense Refill  . acyclovir (ZOVIRAX) 800 MG tablet Take 800 mg by mouth daily.      [name] aspirin EC 81 MG tablet Take 81 mg by mouth daily.      . Blood Pressure KIT 1 Device by Does not apply route 2 (two) times daily.  1 each  0  . cholecalciferol (VITAMIN D) 1000 UNITS tablet Take 1,000 Units by mouth daily.      . cloNIDine (CATAPRES) 0.2 MG tablet Take 1 tablet (0.2 mg total) by mouth 2 (two) times daily.  60 tablet  3  . emtricitabine-tenofovir (TRUVADA) 200-300 MG per tablet Take 1 tablet by mouth every other day.       . furosemide (LASIX) 40 MG tablet Take 1 tablet (40 mg total) by mouth daily.  30 tablet  3  . hydrALAZINE (APRESOLINE) 50 MG tablet Take 1 tablet (50 mg total) by mouth 3 (three) times daily.  90 tablet  3  . HYDROcodone-acetaminophen (NORCO) 10-325 MG per tablet Take 1 tablet by mouth every 6 (six) hours as needed.  20 tablet  0  . labetalol (NORMODYNE) 100 MG tablet Take 1 tablet (100 mg total) by mouth 2 (two) times  daily.  60 tablet  3  . levETIRAcetam (KEPPRA) 500 MG tablet Take 1,500 mg by mouth every 12 (twelve) hours.      [name] lisinopril (PRINIVIL,ZESTRIL) 40 MG tablet Take 1 tablet (40 mg total) by mouth daily.  30 tablet  3  . lopinavir-ritonavir (KALETRA) 200-50 MG per tablet Take 2 tablets by mouth 2 (two) times daily.      [name] LORazepam (ATIVAN) 1 MG tablet Take 1 mg by mouth daily. Take one tablet for seizure prevention.      . methocarbamol (ROBAXIN) 500 MG tablet Take 1,000 mg by mouth 2 (two) times daily. Take two 500 mg tablets by mouth four times daily      . nystatin (MYCOSTATIN) powder Apply 100,000 Bottles topically 4 (four) times daily as needed. Rash      . pantoprazole (PROTONIX) 40 MG tablet Take 40 mg by mouth daily.      . phenytoin (DILANTIN) 50 MG tablet Chew 2 tablets (100 mg total) by mouth 3 (three) times daily.  90 tablet  2  . predniSONE (DELTASONE) 20 MG tablet Take 1 tablet (20 mg total) by mouth daily with breakfast.  3 tablet  0  . triamcinolone cream (KENALOG) 0.1 % Apply 1  application topically 2 (two) times daily as needed. For rash.       No current facility-administered medications on file prior to visit.   Active Ambulatory Problems    Diagnosis Date Noted  . Seizure 02/14/2013  . 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 Unspecified essential hypertension 02/15/2013  . CKD (chronic kidney disease) 02/15/2013  . Proteinuria 02/15/2013  . HIV disease 02/16/2013  . Gout 02/16/2013  . Muscle spasm 02/16/2013  . Accelerated hypertension 05/10/2013  . Macrocytic anemia 05/10/2013  . CKD (chronic kidney disease) stage 3, GFR 30-59 ml/min 05/10/2013  . Herpes simplex 05/10/2013  . CVA (cerebral vascular accident) 05/10/2013  . Diabetes 05/10/2013  . Migraines 05/10/2013  . Hypertensive emergency 05/10/2013  . Left Wrist arthralgia 05/13/2013   Resolved Ambulatory Problems    Diagnosis Date Noted  . No Resolved Ambulatory Problems   Past Medical History  Diagnosis Date    . Seizures   . Stroke   . Meningitis   . Diabetes mellitus without complication   . HIV (human immunodeficiency virus infection)   . Hypertension   . Muscle spasms of head and/or neck    History  Substance Use Topics  . Smoking status: Former Smoker    Types: Cigarettes  . Smokeless tobacco: Never Used  . Alcohol Use: No  family history includes Cancer in her father; Cancer - Colon in her mother; Diabetes in an other family member; Hypertension in her father.   Review of Systems  Constitutional: Negative for fever, chills, diaphoresis, activity change, appetite change, fatigue and unexpected weight change.  HENT: Negative for congestion, sore throat, rhinorrhea, sneezing, trouble swallowing and sinus pressure.  Eyes: Negative for photophobia and visual disturbance.  Respiratory: Negative for cough, chest tightness, shortness of breath, wheezing and stridor.  Cardiovascular: Negative for chest pain, palpitations and leg swelling.  Gastrointestinal: Negative for nausea, vomiting, abdominal pain, diarrhea, constipation, blood in stool, abdominal distention and anal bleeding.  Genitourinary: Negative for dysuria, hematuria, flank pain and difficulty urinating.  Musculoskeletal: Negative for myalgias, back pain, joint swelling, arthralgias and gait problem.  Skin: Negative for color change, pallor, rash and wound.  Neurological: "good days and bad days" forgetfullness Hematological: Negative for adenopathy. Does not bruise/bleed easily.  Psychiatric/Behavioral: Negative for behavioral problems, confusion, sleep disturbance, dysphoric mood, decreased concentration and agitation.       Objective:   Physical Exam BP 170/89  Pulse 91  Temp(Src) 98.4 F (36.9 C) (Oral)  Ht 5' 5.5" (1.664 m)  Wt 198 lb 8 oz (90.039 kg)  BMI 32.52 kg/m2  Constitutional: oriented to person, place, and time.  appears well-developed and well-nourished. No distress.  HENT:  Mouth/Throat: Oropharynx is  clear and moist. No oropharyngeal exudate.  Cardiovascular: Normal rate, regular rhythm and normal heart sounds. Exam reveals no gallop and no friction rub.  No murmur heard.  Pulmonary/Chest: Effort normal and breath sounds normal. No respiratory distress.  no wheezes.  Abdominal: Soft. Bowel sounds are normal. exhibits no distension. There is no tenderness.  Lymphadenopathy: no cervical adenopathy.  Neurological:  alert and oriented to person, place, and time.  Skin: Skin is warm and dry. No rash noted. No erythema.  Psychiatric:  a normal mood and affect.  behavior is normal.       Assessment & Plan:  HIV = will continue with 1 tab every other truvada with kaletra. Will likely try to change her to non-tenofovir based regimen. We will check [id number] to see if can use epzicom. We  need to get records from prior hiv provider to find out her history of her hiv. Genotype history.  Concern about drug interaction of kaletra with dilantin. Dilantin likely inducing kaletra to having lower levels. Can consider changing to II plus NNRTI, such as triomeq.  - once result and old records return, we will call her to change her regimen likely in first week of january  History of syphilis= previously treated many years ago. No need to treat  ckd 3 = she is at borderline of using truvada every other day. Will check bmp today  htn = not well controlled, will mention to her provider next week to consider titrating her meds again.  Health maintenance = to get pap smear scheduled  hcv = will check genotype and viral load  rtc in 4-6 wks

## 2013-05-22 NOTE — Discharge Summary (Signed)
Name: Michelle [name] MRN: [id number] DOB: 17-Nov-1960 52 y.o. PCP: No Pcp Per Patient  Date of Admission: 05/10/2013  6:50 AM Date of Discharge: 05/16/2013 Attending Physician: No att. providers found  Discharge Diagnosis:  Principal Problem:   Hypertensive emergency Active Problems:   Seizure   Postictal state   HIV disease   Macrocytic anemia   CKD (chronic kidney disease) stage 3, GFR 30-59 ml/min   CVA (cerebral vascular accident)   Migraines   Left Wrist arthralgia  Discharge Medications:   Medication List    STOP taking these medications       colchicine 0.6 MG tablet     insulin aspart 100 UNIT/ML injection  Commonly known as:  novoLOG     insulin glargine 100 UNIT/ML injection  Commonly known as:  LANTUS     metoprolol 50 MG tablet  Commonly known as:  LOPRESSOR      TAKE these medications       acyclovir 800 MG tablet  Commonly known as:  ZOVIRAX  Take 800 mg by mouth daily.     aspirin EC 81 MG tablet  Take 81 mg by mouth daily.     cholecalciferol 1000 UNITS tablet  Commonly known as:  VITAMIN D  Take 1,000 Units by mouth daily.     cloNIDine 0.2 MG tablet  Commonly known as:  CATAPRES  Take 1 tablet (0.2 mg total) by mouth 2 (two) times daily.     emtricitabine-tenofovir 200-300 MG per tablet  Commonly known as:  TRUVADA  Take 1 tablet by mouth every other day.     furosemide 40 MG tablet  Commonly known as:  LASIX  Take 1 tablet (40 mg total) by mouth daily.     hydrALAZINE 50 MG tablet  Commonly known as:  APRESOLINE  Take 1 tablet (50 mg total) by mouth 3 (three) times daily.     labetalol 100 MG tablet  Commonly known as:  NORMODYNE  Take 1 tablet (100 mg total) by mouth 2 (two) times daily.     levETIRAcetam 500 MG tablet  Commonly known as:  KEPPRA  Take 1,500 mg by mouth every 12 (twelve) hours.     lisinopril 40 MG tablet  Commonly known as:  PRINIVIL,ZESTRIL  Take 1 tablet (40 mg total) by mouth daily.     lopinavir-ritonavir 200-50 MG per tablet  Commonly known as:  KALETRA  Take 2 tablets by mouth 2 (two) times daily.     LORazepam 1 MG tablet  Commonly known as:  ATIVAN  Take 1 mg by mouth daily. Take one tablet for seizure prevention.     methocarbamol 500 MG tablet  Commonly known as:  ROBAXIN  Take 1,000 mg by mouth 2 (two) times daily. Take two 500 mg tablets by mouth four times daily     nystatin powder  Commonly known as:  MYCOSTATIN  Apply 100,000 Bottles topically 4 (four) times daily as needed. Rash     pantoprazole 40 MG tablet  Commonly known as:  PROTONIX  Take 40 mg by mouth daily.     phenytoin 50 MG tablet  Commonly known as:  DILANTIN  Chew 2 tablets (100 mg total) by mouth 3 (three) times daily.     predniSONE 20 MG tablet  Commonly known as:  DELTASONE  Take 1 tablet (20 mg total) by mouth daily with breakfast.     triamcinolone cream 0.1 %  Commonly known as:  KENALOG  Apply  1 application topically 2 (two) times daily as needed. For rash.        Disposition and follow-up:   [name] was discharged from [facility] in Good condition.  At the hospital follow up visit please address:  1.  Patients blood pressure, and adherence to new medication regimen.  2.  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 Please ensure follow up with neurology and ID for her HIV. Also outpatient follow up with neurology.   3. If persistent Left wrist pain consider follow up with Orthopedics, as pt said she has a hx of gout but has never had joint aspiration done, This may be required to make a definite diagnosis of gout.  Follow-up Appointments:     Follow-up Information   Follow up with Genelle Gather, MD On 05/20/2013. (At 3.30pm)    Specialty:  Internal Medicine   Contact information:   [address] [place] [address] [phone]       Follow up with Judyann Munson, MD  On 05/21/2013. (At 4pm)    Specialty:  Infectious Diseases   Contact information:   [address] [place] [address] [phone]       Discharge Instructions: Discharge Orders   Future Appointments Provider Department Dept Phone   05/28/2013 3:45 PM Manuela Schwartz, MD [facility] [phone]   06/07/2013 10:30 AM Rcid-Rcid Pap [facility] [phone]   07/02/2013 11:30 AM Omelia Blackwater, DO [facility] [phone]   07/09/2013 9:30 AM Judyann Munson, MD [facility] [phone]   Future Orders Complete By Expires   Call MD for:  difficulty breathing, headache or visual disturbances  As directed    Call MD for:  persistant dizziness or light-headedness  As directed    Diet - low sodium heart healthy  As directed    Discharge instructions  As directed    Comments:     Pleaser please follow these instructions closely. 1. We are making some changes to your blood pressure medications- We will continue the frusemide and Lisinopril you were taking previously, but have added new blood pressure medications. Also we want you to get a blood pressure measuring device, check your blood pressure twice a day, and bring it to the clinic with you.  - Labetalol- 100mg  twice a day. - Clonidine- 0.2mg  twice a day. - Hydralazine- 50mg  three time a day. - Frusemide/ Lasix- 40mg  once a day.  For your Diabetes- For now we will be stopping your lantus insulin and Novolog. Please bring your Glucometer to clinic. Check your blood sugars 4 times a day, before meals and at bedtime. So we can determine the right dose of medication for you.   Also we will discharge you to complete 3 doses of prednisone for the swelling in your hand. Take one tablet 1ce a day. We have stopped your colchicine for now.  We have set up appointments for you. Please please keep your  appointments. 1- To see [place] in our clinic on Monday- 22nd of Dec 2014, at 3.30pm with your glucometer and Blood pressure device. 2. To see Dr Drue Second, infectious disease doctor in clinic, on the Tuesday, 23rd of December, by 4pm.   Increase activity slowly  As directed       Consultations:  PCCM  Procedures Performed:  Ct Head Wo Contrast  05/10/2013   CLINICAL DATA:  Headache and confusion.  EXAM: CT HEAD WITHOUT CONTRAST  TECHNIQUE: Contiguous axial images were obtained from the base of the skull through the vertex without intravenous contrast.  COMPARISON:  02/14/2013  FINDINGS: Prominence of the ventricles and sulci is compatible with mild generalized cerebral atrophy, advanced for age. Periventricular white matter hypodensities are compatible with mild chronic small vessel ischemic disease, unchanged. There is no evidence of acute cortical infarct, mass, midline shift, intracranial hemorrhage, or extra-axial fluid collection. Orbits are unremarkable. Visualized paranasal sinuses and mastoid air cells are clear. No evidence of acute fracture.  IMPRESSION: No evidence of acute intracranial abnormality.   Electronically Signed   By: Sebastian Ache   On: 05/10/2013 11:33   Mr Brain Wo Contrast  05/10/2013   CLINICAL DATA:  Altered mental status.  HIV  EXAM: MRI HEAD WITHOUT CONTRAST  TECHNIQUE: Multiplanar, multiecho pulse sequences of the brain and surrounding structures were obtained without intravenous contrast.  COMPARISON:  CT head 05/10/2013  FINDINGS: Moderate atrophy. Mild hyperintensity in the periventricular white matter bilaterally. Mild hyperintensity in the pons bilaterally.  Negative for acute infarct. Negative for hemorrhage or fluid collection negative for mass or edema.  Paranasal sinuses are clear.  IMPRESSION: Generalized atrophy. Mild chronic changes in the white matter and pons. No acute abnormality.   Electronically Signed   By: Marlan Palau M.D.   On: 05/10/2013 19:19   Dg  Hand 2 View Right  05/14/2013   CLINICAL DATA:  Pain in right hand.  No known injury.  EXAM: RIGHT HAND - 2 VIEW  COMPARISON:  None  FINDINGS: Mild bony prominence from the radial base of the proximal phalanx of the thumb. This may reflect mild degenerative spurring or be related to remote trauma.  Remaining joints are normally space and aligned. There are no erosions.  No focal soft tissue swelling, but mild diffuse swelling is suggested. There is no soft tissue ossification or calcification.  IMPRESSION: No fracture or acute finding.  No erosions to suggest gout or other erosive arthropathy.  Mild nonspecific soft tissue swelling.   Electronically Signed   By: Amie Portland M.D.   On: 05/14/2013 16:27   Portable Chest 1 View  05/10/2013   CLINICAL DATA:  Altered mental status  EXAM: PORTABLE CHEST - 1 VIEW  COMPARISON:  None.  FINDINGS: Cardiac shadow is within normal limits given the portable technique. The lungs are clear with exception of minimal platelike atelectasis in the left lateral lung base. No focal confluent infiltrate is seen. No bony abnormality is noted.  IMPRESSION: Minimal left basilar atelectasis.   Electronically Signed   By: Alcide Clever M.D.   On: 05/10/2013 17:36   Admission HPI: Chief Complaint: Headaches and altered mental status.   History of Present Illness: Mrs Gelin Leonette Most is a 52 year old lady with PMH of HTN, CKD- 3, HIV, Seizure, normocytic anemia, migraines, DM and Herpes simplex.  Hx could not be ascertained from pt when we got to pts bedside as she was significantly confused, but responsive. Presented to the Ed with c/o of headaches for the past 1 week as per ED physician note, left sided/central, she denied any change in vision, no nausea or vomiting. Was previously in [place], and in the [place], [place], and has not yet established care here. First HIV appt was next week. She was admitted to the hospital for seizure about 3 months ago, seen in followup at Neuro and  continued  on Dilantin and Keppra. Pt was given reglan in the and later as per ED nurse, pt became confused, could not respond to questions, but this later improved and pt could respond, albiet still confused. Bneadryl was then ordered.  Physical Exam:  Blood pressure 193/93, pulse 89, temperature 98.6 F (37 C), temperature source Oral, resp. rate 17, height 5\' 5"  (1.651 m), weight 183 lb (83.008 kg), SpO2 100.00%.  GENERAL- confused, GCS- 12, appears as stated age.  HEENT- Atraumatic, normocephalic, PERRL, oral mucosa appears moist, No carotid bruit, no cervical LN enlargement, thyroid does not appear enlarged.  CARDIAC- RRR, no murmurs, rubs or gallops.  RESP- Moving equal volumes of air, and clear to auscultation bilaterally.  ABDOMEN- Soft, whinced to palpation on left side of abdomen, no palpable masses or organomegaly, bowel sounds present.  NEURO- No facial droop, complete exam could not be carried out as pt was confused and could not follow commands, but moved all her extremities, though strenght could not be ascertained.  SKIN- Warm, dry, No rash or lesion.   Hospital Course by problem list: Principal Problem:   Hypertensive emergency Active Problems:   Seizure   Postictal state   HIV disease   Macrocytic anemia   CKD (chronic kidney disease) stage 3, GFR 30-59 ml/min   CVA (cerebral vascular accident)   Migraines   Left Wrist arthralgia   #Hypertensive Emergency: presented as hypertensive encephalopathy - Pt presented with headaches, had markedly elevated blood pressure, was vomiting and had altered mental status. Low suspicion for Intracranial infection as pt was afebrile, without signs of meningismus, WBC was 3.9( But not reliable since pt has HIV dx). Also considered was a post-ictal state, which could not be confirmed. Compliance with blood pressure meds was not known as pt was altered on admission and hx could not be obtained. Blood pressure was markedly elevated in  the ED- up to systolic- 229, and Diastolic- 161, and despite IV hydralazine X1 and IV labetalol- 20mg  and then 40mg , blood pressure remained elevated. Pt was intially started on nitroglycerin drip, with the goal of reducing pt blood presure- MAP, by 10% in the first hr. MAP calculated was 146.  But the decision was made to consult PCCM and get pt transferred to the ICU for adequate blood pressure control on IV infusion of Labetalol due to altered mental status, and nitroglycerin drip requiring several titrations in an hour to get appropriate blood pressure response in the step down unit. CT done was unrevealing for cause of Altered mental status and MRI was without acute changes (generalized atrophy with mild chronic white matter changes); EEG- Showed slowing of cerebral activity. Patient was not intubated, but with adequate control of her blood pressure, pts mental status returned to normal, and pt was transferred back to the IMT[facility] on the 05/13/2013. Prior to admission, pts blood pressure regimen- Furosemide- 40mg  daily, Lisionpril- 40mg  daily, and Metoprolol- 50mg  BID (Pt is not tolerant of Calcium channel blockers) but on recommencement of this regimen, blood pressure remained persistently elevated. Blood pressure was a challenge to get under control while on admission, blood pressure meds were titrated and pt was finally discharged home on- Lisinopril- 40mg  daily, Labetalol 100mg  BID, Hydralazine 50mg  TID, and lasix- 40mg  dly and clonidine 0.2mg  BID. Last blood pressure reading on discharge- 135/61. Patient was discharged to follow up closely in our clinic. Case manager also talked to patient on the process of acquiring a PCP.  HIV disease- Appeared Stable. Truvada and lopinavir-ritonavir, on  home med list, was to have her first HIV clinic later this month. Last viral load- 05/07/2013 <20, CD4 count- 470. There was no concern for opportunistic infection causing intracranial infection and therefore altered  mental status, considering low viral load and CD4 counts that were reassuring. Pt Discharged home to follow up with ID, appointment was arranged for patient to follow up prior to discharge.  Seizure, and Post-ictal state- Possible cause or contributary factor to patients altered mental status. Home meds- Keppra- 1500mg  Q12H, and phenytoin- 100mg  TID. This was continued on discharge. Neurology follow up as an out-pt.   CKD- Stage 3- On admission Cr- 1.73, on discharge- 05/14/2013- 1.92. Likely due to HTN, DM and/or HIVnephropathy. Out-pt follow up with nephrology.   Macrocytic Anemia- . MCV- 101. Folate and B12 with normal limits.   #Left Wrist Arthralgia: Resolving. Hx of gout flares treated successfully with Colchicine. Pt reports never having a joint aspiration. Left wrist pain and swelling on admission, low clinical index of suspicion for septic arthritis. Pt was commnenced on a 5 day course of steroids (Prednisone 20mg  x5 days ), after xray findings were negative for erosions suggestive of gout or other athropathy, or fractures. Considering pt CRF, Colchicine and NSAIDS were not used. Pt reported marked improvement in symptoms on steroid therapy. Out patient follow up with orthopedics for possible joint aspiration, if pain reoccurs or persists.  #Macrocytic Anemia: Baseline unknown. Hgb stable throughout admission at 8.4- 8.7. CBC 02/14/13- 10.5 Likely of chronic disease in the setting of CKD; MCV- 101.4, Hb- 9.5. 05/14/2013- Vit B12 wnl; folate WNL. Follow up as an out-pt, also screening colonoscopy.   #Asymptomatic Bacteruria: Patient denied irritative symptoms. Urine cx with >100,000 ecoli. Given that she was asymptomatic, pt was not treated.  #DM: On 05/14/2013- HBA1c A1c- 5.0 , home regimen included lantus 30 qHS and aspart 10-12u TIDWC.  Patient was on sliding scale insulin on admission, but had been getting only 2-3 units of insulin. Considering HBA1c levels, anti-DM meds were not continued  on discharge. Patient was advised to common to her follow up appointment with glucometer to clinic to ascertain need for medication and dosage.   Discharge Vitals:   BP 135/61  Pulse 80  Temp(Src) 97.9 F (36.6 C) (Oral)  Resp 20  Ht 5\' 5"  (1.651 m)  Wt 176 lb 11.2 oz (80.151 kg)  BMI 29.40 kg/m2  SpO2 96%  Discharge Labs:  No results found for this or any previous visit (from the past 24 hour(s)).  Signed: Kennis Carina, MD 05/22/2013, 7:38 AM   Time Spent on Discharge: 35 minutes Services Ordered on Discharge: None Equipment Ordered on Discharge: None

## 2013-05-23 NOTE — Progress Notes (Signed)
I saw and evaluated the patient.  I personally confirmed the key portions of Dr. Sadek's history and exam and reviewed pertinent patient test results.  The assessment, diagnosis, and plan were formulated together and I agree with the documentation in the resident's note. 

## 2013-05-24 ENCOUNTER — Other Ambulatory Visit: Payer: Self-pay | Admitting: Internal Medicine

## 2013-05-24 LAB — HEPATITIS C RNA QUANTITATIVE: HCV Quantitative Log: 6.63 {Log} — ABNORMAL HIGH (ref <1.18)

## 2013-05-27 ENCOUNTER — Telehealth: Payer: Self-pay | Admitting: *Deleted

## 2013-05-27 NOTE — Telephone Encounter (Signed)
Returned call to [place] at [facility] - needed clarification of pt's  Discharged meds: pt was finally discharged home on- Lisinopril- 40mg  daily, Labetalol 100mg  BID, Hydralazine 50mg  TID, and lasix- 40mg  dly and clonidine 0.2mg  BIDper Dr Fredirick Lathe note. Home meds- Keppra- 1500mg  Q12H, and phenytoin- 100mg  TID. This was continued on discharge. Neurology follow up as an out-pt.  This was conveyed to [place].

## 2013-05-28 ENCOUNTER — Encounter (HOSPITAL_COMMUNITY): Payer: Self-pay | Admitting: Emergency Medicine

## 2013-05-28 ENCOUNTER — Inpatient Hospital Stay (HOSPITAL_COMMUNITY)
Admission: EM | Admit: 2013-05-28 | Discharge: 2013-06-02 | DRG: 077 | Disposition: A | Discharge status: Home Health | Payer: Medicaid Other | Attending: Internal Medicine | Admitting: Internal Medicine

## 2013-05-28 ENCOUNTER — Emergency Department (HOSPITAL_COMMUNITY): Payer: Medicaid Other

## 2013-05-28 ENCOUNTER — Ambulatory Visit: Payer: Medicaid Other | Admitting: Internal Medicine

## 2013-05-28 ENCOUNTER — Inpatient Hospital Stay (HOSPITAL_COMMUNITY): Payer: Medicaid Other

## 2013-05-28 DIAGNOSIS — D591 Autoimmune hemolytic anemia, unspecified: Secondary | ICD-10-CM | POA: Diagnosis present

## 2013-05-28 DIAGNOSIS — R569 Unspecified convulsions: Secondary | ICD-10-CM

## 2013-05-28 DIAGNOSIS — I674 Hypertensive encephalopathy: Principal | ICD-10-CM | POA: Diagnosis present

## 2013-05-28 DIAGNOSIS — B009 Herpesviral infection, unspecified: Secondary | ICD-10-CM

## 2013-05-28 DIAGNOSIS — N183 Chronic kidney disease, stage 3 unspecified: Secondary | ICD-10-CM | POA: Diagnosis present

## 2013-05-28 DIAGNOSIS — K219 Gastro-esophageal reflux disease without esophagitis: Secondary | ICD-10-CM | POA: Diagnosis present

## 2013-05-28 DIAGNOSIS — E119 Type 2 diabetes mellitus without complications: Secondary | ICD-10-CM | POA: Diagnosis present

## 2013-05-28 DIAGNOSIS — D638 Anemia in other chronic diseases classified elsewhere: Secondary | ICD-10-CM | POA: Diagnosis present

## 2013-05-28 DIAGNOSIS — G43909 Migraine, unspecified, not intractable, without status migrainosus: Secondary | ICD-10-CM

## 2013-05-28 DIAGNOSIS — Z8673 Personal history of transient ischemic attack (TIA), and cerebral infarction without residual deficits: Secondary | ICD-10-CM

## 2013-05-28 DIAGNOSIS — M109 Gout, unspecified: Secondary | ICD-10-CM | POA: Diagnosis present

## 2013-05-28 DIAGNOSIS — I272 Pulmonary hypertension, unspecified: Secondary | ICD-10-CM | POA: Diagnosis present

## 2013-05-28 DIAGNOSIS — I10 Essential (primary) hypertension: Secondary | ICD-10-CM | POA: Diagnosis present

## 2013-05-28 DIAGNOSIS — G40909 Epilepsy, unspecified, not intractable, without status epilepticus: Secondary | ICD-10-CM | POA: Diagnosis present

## 2013-05-28 DIAGNOSIS — I160 Hypertensive urgency: Secondary | ICD-10-CM | POA: Diagnosis present

## 2013-05-28 DIAGNOSIS — Z9119 Patient's noncompliance with other medical treatment and regimen: Secondary | ICD-10-CM

## 2013-05-28 DIAGNOSIS — N189 Chronic kidney disease, unspecified: Secondary | ICD-10-CM

## 2013-05-28 DIAGNOSIS — Z7982 Long term (current) use of aspirin: Secondary | ICD-10-CM

## 2013-05-28 DIAGNOSIS — I129 Hypertensive chronic kidney disease with stage 1 through stage 4 chronic kidney disease, or unspecified chronic kidney disease: Secondary | ICD-10-CM | POA: Diagnosis present

## 2013-05-28 DIAGNOSIS — M009 Pyogenic arthritis, unspecified: Secondary | ICD-10-CM | POA: Diagnosis present

## 2013-05-28 DIAGNOSIS — M25539 Pain in unspecified wrist: Secondary | ICD-10-CM

## 2013-05-28 DIAGNOSIS — M2540 Effusion, unspecified joint: Secondary | ICD-10-CM | POA: Diagnosis present

## 2013-05-28 DIAGNOSIS — N184 Chronic kidney disease, stage 4 (severe): Secondary | ICD-10-CM | POA: Diagnosis present

## 2013-05-28 DIAGNOSIS — Z79899 Other long term (current) drug therapy: Secondary | ICD-10-CM

## 2013-05-28 DIAGNOSIS — I5189 Other ill-defined heart diseases: Secondary | ICD-10-CM | POA: Diagnosis present

## 2013-05-28 DIAGNOSIS — Z91199 Patient's noncompliance with other medical treatment and regimen due to unspecified reason: Secondary | ICD-10-CM

## 2013-05-28 DIAGNOSIS — B20 Human immunodeficiency virus [HIV] disease: Secondary | ICD-10-CM | POA: Diagnosis present

## 2013-05-28 DIAGNOSIS — E46 Unspecified protein-calorie malnutrition: Secondary | ICD-10-CM | POA: Diagnosis present

## 2013-05-28 DIAGNOSIS — I161 Hypertensive emergency: Secondary | ICD-10-CM

## 2013-05-28 DIAGNOSIS — Z87891 Personal history of nicotine dependence: Secondary | ICD-10-CM

## 2013-05-28 LAB — CBC WITH DIFFERENTIAL/PLATELET
Basophils Absolute: 0 10*3/uL (ref 0.0–0.1)
Basophils Relative: 0 % (ref 0–1)
Eosinophils Absolute: 0 10*3/uL (ref 0.0–0.7)
Eosinophils Relative: 0 % (ref 0–5)
HCT: 27.7 % — ABNORMAL LOW (ref 36.0–46.0)
Lymphocytes Relative: 16 % (ref 12–46)
Lymphs Abs: 1 10*3/uL (ref 0.7–4.0)
MCHC: 32.5 g/dL (ref 30.0–36.0)
MCV: 102.6 fL — ABNORMAL HIGH (ref 78.0–100.0)
Monocytes Absolute: 0.6 10*3/uL (ref 0.1–1.0)
Neutrophils Relative %: 75 % (ref 43–77)
RDW: 13.6 % (ref 11.5–15.5)
WBC: 6.4 10*3/uL (ref 4.0–10.5)

## 2013-05-28 LAB — BASIC METABOLIC PANEL
CO2: 23 mEq/L (ref 19–32)
Calcium: 8.3 mg/dL — ABNORMAL LOW (ref 8.4–10.5)
Creatinine, Ser: 1.61 mg/dL — ABNORMAL HIGH (ref 0.50–1.10)
GFR calc Af Amer: 41 mL/min — ABNORMAL LOW (ref >90)
Glucose, Bld: 133 mg/dL — ABNORMAL HIGH (ref 70–99)
Potassium: 4.2 mEq/L (ref 3.7–5.3)
Sodium: 140 mEq/L (ref 137–147)

## 2013-05-28 LAB — HEPATIC FUNCTION PANEL
ALT: 21 U/L (ref 0–35)
AST: 21 U/L (ref 0–37)
Albumin: 2.2 g/dL — ABNORMAL LOW (ref 3.5–5.2)
Alkaline Phosphatase: 132 U/L — ABNORMAL HIGH (ref 39–117)
Bilirubin, Direct: 1 mg/dL — ABNORMAL HIGH (ref 0.0–0.3)
Total Bilirubin: 1.3 mg/dL — ABNORMAL HIGH (ref 0.3–1.2)

## 2013-05-28 LAB — MRSA PCR SCREENING: MRSA by PCR: NEGATIVE

## 2013-05-28 LAB — SYNOVIAL FLUID, CRYSTAL: Crystals, Fluid: NONE SEEN

## 2013-05-28 LAB — RAPID URINE DRUG SCREEN, HOSP PERFORMED
Amphetamines: NOT DETECTED
Barbiturates: NOT DETECTED
Benzodiazepines: NOT DETECTED
Cocaine: NOT DETECTED

## 2013-05-28 MED ORDER — FUROSEMIDE 40 MG PO TABS
40.0000 mg | ORAL_TABLET | Freq: Every day | ORAL | Status: DC
  Administered 2013-05-29 – 2013-06-02 (×5): 40 mg via ORAL
  Filled 2013-05-28 (×5): qty 1

## 2013-05-28 MED ORDER — VANCOMYCIN HCL 10 G IV SOLR
1500.0000 mg | Freq: Once | INTRAVENOUS | Status: AC
  Administered 2013-05-28: 1500 mg via INTRAVENOUS
  Filled 2013-05-28: qty 1500

## 2013-05-28 MED ORDER — LABETALOL HCL 5 MG/ML IV SOLN: 0.5000 mg/min | INTRAVENOUS | Status: DC

## 2013-05-28 MED ORDER — SODIUM CHLORIDE 0.9 % IJ SOLN: 3.0000 mL | Freq: Two times a day (BID) | INTRAMUSCULAR | Status: DC

## 2013-05-28 MED ORDER — LEVETIRACETAM 750 MG PO TABS
1500.0000 mg | ORAL_TABLET | Freq: Two times a day (BID) | ORAL | Status: DC
  Administered 2013-05-28 – 2013-06-02 (×10): 1500 mg via ORAL
  Filled 2013-05-28 (×11): qty 2

## 2013-05-28 MED ORDER — CLONIDINE HCL 0.2 MG PO TABS
0.2000 mg | ORAL_TABLET | Freq: Two times a day (BID) | ORAL | Status: DC
  Administered 2013-05-28 – 2013-05-29 (×2): 0.2 mg via ORAL
  Filled 2013-05-28 (×4): qty 1

## 2013-05-28 MED ORDER — NICARDIPINE HCL IN NACL 20-0.86 MG/200ML-% IV SOLN
5.0000 mg/h | INTRAVENOUS | Status: DC
  Administered 2013-05-28: 5 mg/h via INTRAVENOUS
  Filled 2013-05-28 (×2): qty 200

## 2013-05-28 MED ORDER — LISINOPRIL 40 MG PO TABS
40.0000 mg | ORAL_TABLET | Freq: Every day | ORAL | Status: DC
  Administered 2013-05-29 – 2013-06-02 (×5): 40 mg via ORAL
  Filled 2013-05-28 (×5): qty 1

## 2013-05-28 MED ORDER — LABETALOL HCL 100 MG PO TABS
100.0000 mg | ORAL_TABLET | Freq: Two times a day (BID) | ORAL | Status: DC
  Administered 2013-05-28 – 2013-06-02 (×10): 100 mg via ORAL
  Filled 2013-05-28 (×13): qty 1

## 2013-05-28 MED ORDER — HYDROMORPHONE HCL PF 1 MG/ML IJ SOLN
1.0000 mg | Freq: Once | INTRAMUSCULAR | Status: AC
  Administered 2013-05-28: 1 mg via INTRAVENOUS
  Filled 2013-05-28: qty 1

## 2013-05-28 MED ORDER — INSULIN ASPART 100 UNIT/ML ~~LOC~~ SOLN
2.0000 [IU] | SUBCUTANEOUS | Status: DC
  Administered 2013-05-28: 4 [IU] via SUBCUTANEOUS

## 2013-05-28 MED ORDER — PHENYTOIN 50 MG PO CHEW
100.0000 mg | CHEWABLE_TABLET | Freq: Three times a day (TID) | ORAL | Status: DC
  Administered 2013-05-28 – 2013-06-02 (×14): 100 mg via ORAL
  Filled 2013-05-28 (×16): qty 2

## 2013-05-28 MED ORDER — SODIUM CHLORIDE 0.9 % IJ SOLN
3.0000 mL | INTRAMUSCULAR | Status: DC | PRN
  Administered 2013-05-29: 3 mL via INTRAVENOUS

## 2013-05-28 MED ORDER — SODIUM CHLORIDE 0.9 % IV SOLN: 250.0000 mL | INTRAVENOUS | Status: DC | PRN

## 2013-05-28 MED ORDER — PANTOPRAZOLE SODIUM 40 MG PO TBEC
40.0000 mg | DELAYED_RELEASE_TABLET | Freq: Every day | ORAL | Status: DC
  Administered 2013-05-29 – 2013-06-02 (×4): 40 mg via ORAL
  Filled 2013-05-28 (×3): qty 1

## 2013-05-28 MED ORDER — EMTRICITABINE-TENOFOVIR DF 200-300 MG PO TABS
1.0000 | ORAL_TABLET | ORAL | Status: DC
  Administered 2013-05-29 – 2013-05-31 (×2): 1 via ORAL
  Filled 2013-05-28 (×2): qty 1

## 2013-05-28 MED ORDER — ACYCLOVIR 800 MG PO TABS
800.0000 mg | ORAL_TABLET | Freq: Every day | ORAL | Status: DC
  Administered 2013-05-29 – 2013-06-02 (×5): 800 mg via ORAL
  Filled 2013-05-28 (×5): qty 1

## 2013-05-28 MED ORDER — LABETALOL HCL 5 MG/ML IV SOLN
20.0000 mg | Freq: Once | INTRAVENOUS | Status: AC
  Administered 2013-05-28: 20 mg via INTRAVENOUS
  Filled 2013-05-28: qty 4

## 2013-05-28 MED ORDER — HYDROCODONE-ACETAMINOPHEN 10-325 MG PO TABS
1.0000 | ORAL_TABLET | Freq: Four times a day (QID) | ORAL | Status: DC | PRN
  Administered 2013-05-29 – 2013-05-30 (×4): 1 via ORAL
  Filled 2013-05-28 (×4): qty 1

## 2013-05-28 MED ORDER — DEXTROSE 5 % IV SOLN
2.0000 g | INTRAVENOUS | Status: DC
  Administered 2013-05-29: 2 g via INTRAVENOUS
  Filled 2013-05-28 (×2): qty 2

## 2013-05-28 MED ORDER — LORAZEPAM 1 MG PO TABS
1.0000 mg | ORAL_TABLET | Freq: Every day | ORAL | Status: DC
  Administered 2013-05-29 – 2013-06-02 (×2): 1 mg via ORAL
  Filled 2013-05-28 (×2): qty 1

## 2013-05-28 MED ORDER — HEPARIN SODIUM (PORCINE) 5000 UNIT/ML IJ SOLN
5000.0000 [IU] | Freq: Three times a day (TID) | INTRAMUSCULAR | Status: DC
  Administered 2013-05-28 – 2013-06-02 (×14): 5000 [IU] via SUBCUTANEOUS
  Filled 2013-05-28 (×18): qty 1

## 2013-05-28 MED ORDER — VANCOMYCIN HCL IN DEXTROSE 750-5 MG/150ML-% IV SOLN
750.0000 mg | Freq: Two times a day (BID) | INTRAVENOUS | Status: DC
  Administered 2013-05-29 – 2013-05-30 (×3): 750 mg via INTRAVENOUS
  Filled 2013-05-28 (×6): qty 150

## 2013-05-28 MED ORDER — ASPIRIN EC 81 MG PO TBEC
81.0000 mg | DELAYED_RELEASE_TABLET | Freq: Every day | ORAL | Status: DC
  Administered 2013-05-29 – 2013-06-02 (×5): 81 mg via ORAL
  Filled 2013-05-28 (×5): qty 1

## 2013-05-28 MED ORDER — HYDRALAZINE HCL 50 MG PO TABS
50.0000 mg | ORAL_TABLET | Freq: Three times a day (TID) | ORAL | Status: DC
  Administered 2013-05-28 – 2013-05-31 (×11): 50 mg via ORAL
  Filled 2013-05-28 (×15): qty 1

## 2013-05-28 MED ORDER — DIPHENHYDRAMINE HCL 50 MG/ML IJ SOLN
25.0000 mg | Freq: Four times a day (QID) | INTRAMUSCULAR | Status: DC | PRN
  Administered 2013-05-28 – 2013-05-29 (×2): 25 mg via INTRAVENOUS
  Filled 2013-05-28 (×2): qty 1

## 2013-05-28 MED ORDER — NICARDIPINE HCL IN NACL 20-0.86 MG/200ML-% IV SOLN
5.0000 mg/h | Freq: Once | INTRAVENOUS | Status: AC
  Administered 2013-05-28: 5 mg/h via INTRAVENOUS
  Filled 2013-05-28: qty 200

## 2013-05-28 MED ORDER — LOPINAVIR-RITONAVIR 200-50 MG PO TABS
2.0000 | ORAL_TABLET | Freq: Two times a day (BID) | ORAL | Status: DC
  Administered 2013-05-28 – 2013-06-02 (×10): 2 via ORAL
  Filled 2013-05-28 (×11): qty 2

## 2013-05-28 NOTE — ED Notes (Addendum)
Admitting provider contacted about patient's blood pressure, released sign and held orders per instructions, to give afternoon medication.

## 2013-05-28 NOTE — ED Notes (Signed)
Dr. Walden at bedside 

## 2013-05-28 NOTE — Consult Note (Signed)
PULMONARY  / CRITICAL CARE MEDICINE  Name: Michelle [name] MRN: [id number] DOB: [birth year]    ADMISSION DATE:  05/28/2013 CONSULTATION DATE:  05/28/2013  REFERRING MD :  Blanch Media PRIMARY SERVICE: PCCM   CHIEF COMPLAINT:  Hypertensive Emergency   BRIEF PATIENT DESCRIPTION:  52 y/o female presented to ED with Lt wrist pain.  She developed acute encephalopathy from malignant HTN with BP of 217/103.  She was transferred to ICU for cardene gtt, and PCCM assumed care from [facility].  She had similar admission on 05/11/13.  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  She had hx of gout, and was being tx as outpt for gout flare.  Her swelling did not improve.  She had Lt wrist arthrocentesis, and started on Abx with concern for possible septic arthritis.  She was evaluated by IMTS for admission.  While in ED she developed confused speech, and disorientation.  She was noted to have BP of 217/103.  Concern was for malignant HTN causes HTN encephalopathy.  She had similar admission on 05/11/13.  Concern has been for medical non-compliance.  PCCM assumed care while pt was in ICU on cardene infusion.  Pt unable to provide adequate history, and history obtained from review of medical records.  Past Medical History  Diagnosis Date  . Seizures   . Stroke   . Meningitis   . Diabetes mellitus without complication   . HIV (human immunodeficiency virus infection)   . Hypertension   . Gout   . Muscle spasms of head and/or neck    History reviewed. No pertinent past surgical history.  No current facility-administered medications on file prior to encounter.   Current Outpatient Prescriptions on File Prior to Encounter  Medication Sig Dispense  Refill  . acyclovir (ZOVIRAX) 800 MG tablet Take 800 mg by mouth daily.      [name] aspirin EC 81 MG tablet Take 81 mg by mouth daily.      . Blood Glucose Monitoring Suppl (ACCU-CHEK AVIVA PLUS) W/DEVICE KIT USE AS DIRECTED  1 kit  0  . cholecalciferol (VITAMIN D) 1000 UNITS tablet Take 1,000 Units by mouth daily.      . cloNIDine (CATAPRES) 0.2 MG tablet Take 1 tablet (0.2 mg total) by mouth 2 (two) times daily.  60 tablet  3  . emtricitabine-tenofovir (TRUVADA) 200-300 MG per tablet Take 1 tablet by mouth every other day.       . furosemide (LASIX) 40 MG tablet Take 1 tablet (40 mg total) by mouth daily.  30 tablet  3  . hydrALAZINE (APRESOLINE) 50 MG tablet Take 1 tablet (50 mg total) by mouth 3 (three) times daily.  90 tablet  3  . labetalol (NORMODYNE) 100 MG tablet Take 1 tablet (100 mg total) by mouth 2 (two) times daily.  60 tablet  3  . levETIRAcetam (KEPPRA) 500 MG tablet Take 1,500 mg by mouth every 12 (twelve) hours.      [name] lisinopril (PRINIVIL,ZESTRIL) 40 MG tablet Take 1 tablet (40 mg total) by mouth daily.  30 tablet  3  . lopinavir-ritonavir (KALETRA) 200-50 MG per tablet Take 2 tablets by mouth 2 (two) times daily.      [name] LORazepam (ATIVAN) 1 MG tablet  Take 1 mg by mouth daily. Take one tablet for seizure prevention.      . methocarbamol (ROBAXIN) 500 MG tablet Take 1,000 mg by mouth 2 (two) times daily. Take two 500 mg tablets by mouth four times daily      . pantoprazole (PROTONIX) 40 MG tablet Take 40 mg by mouth daily.      . phenytoin (DILANTIN) 50 MG tablet Chew 2 tablets (100 mg total) by mouth 3 (three) times daily.  90 tablet  2  . predniSONE (DELTASONE) 20 MG tablet Take 1 tablet (20 mg total) by mouth daily with breakfast.  3 tablet  0  . triamcinolone cream (KENALOG) 0.1 % Apply 1 application topically 2 (two) times daily as needed. For rash.       Family History  Problem Relation Age of Onset  . Cancer - Colon Mother   . Cancer Father   . Diabetes    . Hypertension  Father    History   Social History  . Marital Status: Single    Spouse Name: N/A    Number of Children: 4  . Years of Education: 11th   Occupational History  . Not on file.   Social History Main Topics  . Smoking status: Former Smoker    Types: Cigarettes  . Smokeless tobacco: Never Used  . Alcohol Use: No  . Drug Use: No     Comment: past history of alcohol, cocaine and IV drug use  . Sexual Activity: Not Currently    Partners: Male     Comment: given condoms   Other Topics Concern  . Not on file   Social History Narrative   Patient lives at home with sister.    Patient is unemployed.    Patient is single.    Patient has 2 alive and 2 deceased.    Patient has 11th grade education.          Allergies  Allergen Reactions  . Norvasc [Amlodipine Besylate]     Itching, rash , hives .   [name] Morphine And Related Hives, Itching and Rash   ROS: Unable to obtain due to pt mental status.  SUBJECTIVE:    VITAL SIGNS: Temp:  [99.1 F (37.3 C)-101.1 F (38.4 C)] 101.1 F (38.4 C) (12/30 1955) Pulse Rate:  [79-116] 110 (12/30 2045) Resp:  [13-35] 26 (12/30 2045) BP: (152-224)/(66-112) 176/73 mmHg (12/30 2045) SpO2:  [97 %-100 %] 98 % (12/30 2045) Weight:  [198 lb 6.6 oz (90 kg)] 198 lb 6.6 oz (90 kg) (12/30 1500) Room air  INTAKE / OUTPUT: Intake/Output     12/30 0701 - 12/31 0700   I.V. (mL/kg) 89.2 (1)   Total Intake(mL/kg) 89.2 (1)   Urine (mL/kg/hr) 150   Total Output 150   Net -60.8        PHYSICAL EXAMINATION: General: Agitated at times Neuro: Has difficulty with word finding, repeats that she is cold, able to tell me her name, moves extremities, CN intact HEENT: Pupils reactive, no sinus tenderness, no LAN Cardiovascular: regular, no murmur Lungs: no wheeze, rales Abdomen: soft, non tender, + bowel sounds Musculoskeletal: swollen Lt wrist Skin: no rashes  LABS:  CBC  Recent Labs Lab 05/28/13 1046  WBC 6.4  HGB 9.0*  HCT 27.7*  PLT 200    Coag's No results found for this basename: APTT, INR,  in the last 168 hours BMET  Recent Labs Lab 05/28/13 1046  NA 140  K 4.2  CL 105  CO2 23  BUN 22  CREATININE 1.61*  GLUCOSE 133*   Electrolytes  Recent Labs Lab 05/28/13 1046 05/28/13 1930  CALCIUM 8.3*  --   MG  --  1.4*   Liver Enzymes  Recent Labs Lab 05/28/13 1930  AST 21  ALT 21  ALKPHOS 132*  BILITOT 1.3*  ALBUMIN 2.2*   Glucose  Recent Labs Lab 05/28/13 1645 05/28/13 2025  GLUCAP 89 137*   Imaging: Dg Wrist Complete Left  05/28/2013   CLINICAL DATA:  Pain and swelling.  Limited range of motion.  EXAM: LEFT WRIST - COMPLETE 3+ VIEW  COMPARISON:  None.  FINDINGS: The bones appear somewhat osteopenic. There is no evidence of fracture, dislocation, degenerative change or other focal finding.  IMPRESSION: Question osteopenia.  Otherwise normal radiographs.   Electronically Signed   By: Paulina Fusi M.D.   On: 05/28/2013 10:33   Ct Head Wo Contrast  05/28/2013   CLINICAL DATA:  Back and arm pain  EXAM: CT HEAD WITHOUT CONTRAST  TECHNIQUE: Contiguous axial images were obtained from the base of the skull through the vertex without intravenous contrast.  COMPARISON:  02/14/2013  FINDINGS: Skull and Sinuses:No significant abnormality.  Orbits: No acute abnormality.  Brain: No evidence of acute abnormality, such as acute infarction, hemorrhage, hydrocephalus, or mass lesion/mass effect. Generalized atrophy, age advanced. Stable pattern of chronic small vessel ischemic injury, with periventricular white matter low attenuation.  IMPRESSION: 1. No evidence of acute intracranial disease. 2. Generalized brain atrophy.   Electronically Signed   By: Tiburcio Pea M.D.   On: 05/28/2013 17:13     ASSESSMENT / PLAN:  PULMONARY A:   No active issues. P:   Goal SpO2>92 Supplemental oxygen PRN  CARDIOVASCULAR A:   Malignant hypertension >> likely related to medical non compliance. P:  Cardene gtt for goal  SBP < 170 Continue ASA, catapres, lasix, hydralazine, labetalol, lisinopril  RENAL A:   Chronic kidney disease stage 3. P:   Trend BMP  GASTROINTESTINAL A:   Nutrition. P:   Diet as tolerated Continue protonix  HEMATOLOGIC A:   Anemia of chronic disease. P:  Trend CBC SQ heparin for DVT Px  INFECTIOUS A:   Possible septic arthritis Lt wrist. Hx of HIV, HSV, syphilis. P:   Continue vancomycin, rocephin Continue HAART, acyclovir  ENDOCRINE A:   No issues. P:   Monitor blood sugars on BMET  NEUROLOGIC A:   Acute encephalopathy in setting of malignant hypertensive.   H/o seizures. P:   Neuro checks Continue keppra, ativan, dilantin  PCCM will assume primary care while pt is in ICU >> once off cardene gtt will transfer service back to IMTS.  CC time 35 minutes  Coralyn Helling, MD [facility] Pulmonary/Critical Care 05/28/2013, 9:24 PM Pager:  [phone] After 3pm call: [phone]

## 2013-05-28 NOTE — ED Notes (Signed)
Swabs collected at 1900 were collected by Carollee Sires, RN and admitting physician.

## 2013-05-28 NOTE — H&P (Signed)
Date: 05/28/2013               Patient Name:  Michelle [name] MRN: [id number]  DOB: [birth year] Age / Sex: 52 y.o., female   PCP: Christen Bame, MD         Medical Service: Internal Medicine Teaching Service         Attending Physician: Dr. Blanch Media, MD    First Contact: Dr. Vivi Barrack Pager: [phone]  Second Contact: Dr. Dede Query Pager: [phone]       After Hours (After 5p/  First Contact Pager: [phone]  weekends / holidays): Second Contact Pager: [phone]   Chief Complaint: Left wrist pain  History of Present Illness:  Michelle [name] is a 52 y.o. woman PMH of HTN with recent admission for HTN emergency, CKD stage 3 (baseline Cr ~1.7), well controlled DM2 (last A1C 5.0 on 05/14/13), well controlled HIV (last CD4 490, VL <20 on 05/07/13), HCV, seizure disorder, migraines, DM2, herpes simplex, gout, who presents to the ED with left wrist pain.  She has a history of gout in her ankles, treated successfully with colchicine in the past. She was recently admitted to the IMTS from 12/12-12/18/14 for hypertension emergency presenting as encephalopathy, and at that time complained of left wrist pain and swelling. She was given a five-day course of prednisone 20 mg per day, which led to marked improvement in her symptoms. X-Redditt of the RIGHT wrist was performed and negative for erosions or fractures. (Some documentation says the pain was in her right wrist, other notes - including the discharge summary - report left wrist pain.) Patient says her pain was "moving around." Given her chronic renal failure, colchicine and NSAIDs were discontinued upon discharge. Patient was instructed to follow up with orthopedics for joint aspiration of pain recurred.  Today she came to the ED because her pain is worse, 7/10, and has been ongoing for 2 weeks. The pain is intermittent. Nothing really makes it better or worse. She has not tried taking any pain medicine at home. She says she cannot move her hand, wrist,  fingers, or elbow. She is also complaining of left-sided back pain. She denies abdominal pain, nausea, vomiting, diarrhea, chest pain, shortness of breath, dysuria. She denies rash, vaginal discharge. She moved here from [place] about a year ago. No other travel, within [place] or foreign. No significant outdoor activity. No history of tick bites.  She stopped smoking 1.5 years ago. She does not use alcohol regularly and her last drink was 6 months ago. She denies street drugs but admits to using them years ago. She is not sexually active. She last had intercourse 3 years ago. She does admit to a history many years ago of gonorrhea, chlamydia, and herpes in addition to her known history of syphilis, HIV.  In the ED her left wrist was tapped yielding about 1cc of fluid.   Meds: Current Facility-Administered Medications  Medication Dose Route Frequency Provider Last Rate Last Dose  . cefTRIAXone (ROCEPHIN) 2 g in dextrose 5 % 50 mL IVPB  2 g Intravenous Q24H Kaylyn Layer, RPH      . cloNIDine (CATAPRES) tablet 0.2 mg  0.2 mg Oral BID Na Li, MD      . diphenhydrAMINE (BENADRYL) injection 25 mg  25 mg Intravenous Q6H PRN Na Li, MD      . furosemide (LASIX) tablet 40 mg  40 mg Oral Daily Na Li, MD      . hydrALAZINE (APRESOLINE)  tablet 50 mg  50 mg Oral TID Na Li, MD   50 mg at 05/28/13 1558  . labetalol (NORMODYNE) tablet 100 mg  100 mg Oral BID Na Li, MD      . lisinopril (PRINIVIL,ZESTRIL) tablet 40 mg  40 mg Oral Daily Na Li, MD      . Melene Muller ON 05/29/2013] vancomycin (VANCOCIN) IVPB 750 mg/150 ml premix  750 mg Intravenous Q12H Kaylyn Layer, [facility]       Current Outpatient Prescriptions  Medication Sig Dispense Refill  . acyclovir (ZOVIRAX) 800 MG tablet Take 800 mg by mouth daily.      [name] aspirin EC 81 MG tablet Take 81 mg by mouth daily.      . Blood Glucose Monitoring Suppl (ACCU-CHEK AVIVA PLUS) W/DEVICE KIT USE AS DIRECTED  1 kit  0  . cholecalciferol (VITAMIN D) 1000 UNITS tablet Take 1,000  Units by mouth daily.      . cloNIDine (CATAPRES) 0.2 MG tablet Take 1 tablet (0.2 mg total) by mouth 2 (two) times daily.  60 tablet  3  . emtricitabine-tenofovir (TRUVADA) 200-300 MG per tablet Take 1 tablet by mouth every other day.       . furosemide (LASIX) 40 MG tablet Take 1 tablet (40 mg total) by mouth daily.  30 tablet  3  . hydrALAZINE (APRESOLINE) 50 MG tablet Take 1 tablet (50 mg total) by mouth 3 (three) times daily.  90 tablet  3  . HYDROcodone-acetaminophen (NORCO) 10-325 MG per tablet Take 1 tablet by mouth every 6 (six) hours as needed.      . labetalol (NORMODYNE) 100 MG tablet Take 1 tablet (100 mg total) by mouth 2 (two) times daily.  60 tablet  3  . levETIRAcetam (KEPPRA) 500 MG tablet Take 1,500 mg by mouth every 12 (twelve) hours.      [name] lisinopril (PRINIVIL,ZESTRIL) 40 MG tablet Take 1 tablet (40 mg total) by mouth daily.  30 tablet  3  . lopinavir-ritonavir (KALETRA) 200-50 MG per tablet Take 2 tablets by mouth 2 (two) times daily.      [name] LORazepam (ATIVAN) 1 MG tablet Take 1 mg by mouth daily. Take one tablet for seizure prevention.      . methocarbamol (ROBAXIN) 500 MG tablet Take 1,000 mg by mouth 2 (two) times daily. Take two 500 mg tablets by mouth four times daily      . pantoprazole (PROTONIX) 40 MG tablet Take 40 mg by mouth daily.      . phenytoin (DILANTIN) 50 MG tablet Chew 2 tablets (100 mg total) by mouth 3 (three) times daily.  90 tablet  2  . predniSONE (DELTASONE) 20 MG tablet Take 1 tablet (20 mg total) by mouth daily with breakfast.  3 tablet  0  . triamcinolone cream (KENALOG) 0.1 % Apply 1 application topically 2 (two) times daily as needed. For rash.        Allergies: Allergies as of 05/28/2013 - Review Complete 05/28/2013  Allergen Reaction Noted  . Norvasc [amlodipine besylate]  03/30/2013  . Morphine and related Hives, Itching, and Rash 02/15/2013   Past Medical History  Diagnosis Date  . Seizures   . Stroke   . Meningitis   . Diabetes  mellitus without complication   . HIV (human immunodeficiency virus infection)   . Hypertension   . Gout   . Muscle spasms of head and/or neck    History reviewed. No pertinent past surgical history. Family History  Problem Relation Age of Onset  . Cancer - Colon Mother   . Cancer Father   . Diabetes    . Hypertension Father    History   Social History  . Marital Status: Single    Spouse Name: N/A    Number of Children: 4  . Years of Education: 11th   Occupational History  . Not on file.   Social History Main Topics  . Smoking status: Former Smoker    Types: Cigarettes  . Smokeless tobacco: Never Used  . Alcohol Use: No  . Drug Use: No     Comment: past history of alcohol, cocaine and IV drug use  . Sexual Activity: Not Currently    Partners: Male     Comment: given condoms   Other Topics Concern  . Not on file   Social History Narrative   Patient lives at home with sister.    Patient is unemployed.    Patient is single.    Patient has 2 alive and 2 deceased.    Patient has 11th grade education.           Review of Systems: Pertinent items are noted in HPI.  Physical Exam: Blood pressure 181/81, pulse 100, temperature 99.1 F (37.3 C), temperature source Rectal, resp. rate 21, height 5' 5.35" (1.66 m), weight 198 lb 6.6 oz (90 kg), SpO2 97.00%. Physical Exam  Constitutional: She is oriented to person, place, and time and well-developed, well-nourished, and in no distress.  HENT:  Head: Normocephalic and atraumatic.  Eyes: Conjunctivae and EOM are normal. Pupils are equal, round, and reactive to light.  Neck: Normal range of motion. Neck supple.  Cardiovascular: Normal rate, regular rhythm, normal heart sounds and intact distal pulses.  Exam reveals no gallop and no friction rub.   No murmur heard. Pulmonary/Chest: Effort normal and breath sounds normal.  Abdominal: Soft. She exhibits no distension. There is no tenderness.  Musculoskeletal:       Right  wrist: Normal. She exhibits normal range of motion, no tenderness and no swelling.       Left wrist: She exhibits decreased range of motion (Patient will not perform range of motion exam for wrist, fingers, elbow, or shoulder. Moves fingers when encouraged.), tenderness (No pain with direct palaption when distracted, but significant pain and grimacing when directed.) and swelling.       Right ankle: Normal. She exhibits normal range of motion and no swelling. No tenderness.       Left ankle: Normal. She exhibits normal range of motion and no swelling. No tenderness.  Pulses intact.   Neurological: She is alert and oriented to person, place, and time. No cranial nerve deficit. GCS score is 15.  Skin: Skin is warm and dry. No rash noted.     Psychiatric: Affect normal.  Difficult historian, some delayed and inappropriate answers.    Lab results: Basic Metabolic Panel:  Recent Labs  16/10/96 1046  NA 140  K 4.2  CL 105  CO2 23  GLUCOSE 133*  BUN 22  CREATININE 1.61*  CALCIUM 8.3*   Liver Function Tests: No results found for this basename: AST, ALT, ALKPHOS, BILITOT, PROT, ALBUMIN,  in the last 72 hours No results found for this basename: LIPASE, AMYLASE,  in the last 72 hours No results found for this basename: AMMONIA,  in the last 72 hours CBC:  Recent Labs  05/28/13 1046  WBC 6.4  NEUTROABS 4.7  HGB 9.0*  HCT 27.7*  MCV 102.6*  PLT 200   Cardiac Enzymes: No results found for this basename: CKTOTAL, CKMB, CKMBINDEX, TROPONINI,  in the last 72 hours BNP: No results found for this basename: PROBNP,  in the last 72 hours D-Dimer: No results found for this basename: DDIMER,  in the last 72 hours CBG:  Recent Labs  05/28/13 1645  GLUCAP 89   Hemoglobin A1C: No results found for this basename: HGBA1C,  in the last 72 hours Fasting Lipid Panel: No results found for this basename: CHOL, HDL, LDLCALC, TRIG, CHOLHDL, LDLDIRECT,  in the last 72 hours Thyroid Function  Tests: No results found for this basename: TSH, T4TOTAL, FREET4, T3FREE, THYROIDAB,  in the last 72 hours Anemia Panel: No results found for this basename: VITAMINB12, FOLATE, FERRITIN, TIBC, IRON, RETICCTPCT,  in the last 72 hours Coagulation: No results found for this basename: LABPROT, INR,  in the last 72 hours Urine Drug Screen: Drugs of Abuse     Component Value Date/Time   LABOPIA POSITIVE* 05/28/2013 1737   COCAINSCRNUR NONE DETECTED 05/28/2013 1737   LABBENZ NONE DETECTED 05/28/2013 1737   AMPHETMU NONE DETECTED 05/28/2013 1737   THCU NONE DETECTED 05/28/2013 1737   LABBARB NONE DETECTED 05/28/2013 1737    Alcohol Level: No results found for this basename: ETH,  in the last 72 hours Urinalysis: No results found for this basename: COLORURINE, APPERANCEUR, LABSPEC, PHURINE, GLUCOSEU, HGBUR, BILIRUBINUR, KETONESUR, PROTEINUR, UROBILINOGEN, NITRITE, LEUKOCYTESUR,  in the last 72 hours   Imaging results:  Dg Wrist Complete Left  05/28/2013   CLINICAL DATA:  Pain and swelling.  Limited range of motion.  EXAM: LEFT WRIST - COMPLETE 3+ VIEW  COMPARISON:  None.  FINDINGS: The bones appear somewhat osteopenic. There is no evidence of fracture, dislocation, degenerative change or other focal finding.  IMPRESSION: Question osteopenia.  Otherwise normal radiographs.   Electronically Signed   By: Paulina Fusi M.D.   On: 05/28/2013 10:33   Ct Head Wo Contrast  05/28/2013   CLINICAL DATA:  Back and arm pain  EXAM: CT HEAD WITHOUT CONTRAST  TECHNIQUE: Contiguous axial images were obtained from the base of the skull through the vertex without intravenous contrast.  COMPARISON:  02/14/2013  FINDINGS: Skull and Sinuses:No significant abnormality.  Orbits: No acute abnormality.  Brain: No evidence of acute abnormality, such as acute infarction, hemorrhage, hydrocephalus, or mass lesion/mass effect. Generalized atrophy, age advanced. Stable pattern of chronic small vessel ischemic injury, with  periventricular white matter low attenuation.  IMPRESSION: 1. No evidence of acute intracranial disease. 2. Generalized brain atrophy.   Electronically Signed   By: Tiburcio Pea M.D.   On: 05/28/2013 17:13    Other results: EKG: None  Assessment & Plan by Problem: Principal Problem:   Left Wrist arthralgia Active Problems:   Unspecified essential hypertension   HIV disease   CKD (chronic kidney disease) stage 3, GFR 30-59 ml/min   Diabetes   Hypertensive emergency   Hypertensive urgency  Michelle [name] is a 52 y.o. woman PMH of HTN with recent admission for HTN emergency, CKD stage 3 (baseline Cr ~1.7), well controlled DM2 (last A1C 5.0 on 05/14/13), well controlled HIV (last CD4 490, VL <20 on 05/07/13), HCV, seizure disorder, migraines, DM2, herpes simplex, gout, who presents to the ED with left wrist pain who later developed altered mental status.  #Hypertensive emergency with encephalopathy - We were called by the ED around 4pm, after seeing the patient, that she had become altered and confused.  On re-examination she was alert but dysarthric, confused speech, intermittently disoriented. She was not compliant with repeat neurologic examination. Head CT was negative for an acute bleed. She has history of hypertension emergency causing encephalopathy. Blood pressure is currently 217/103. Suspect medication noncompliance. Home medications include clonidine 0.2 mg twice a day, Lasix 40 mg daily, hydralazine 50 mg 3 times a day, labetalol 100 mg twice a day, lisinopril 40 mg daily.  - Transfer to ICU under PCCM for nicardipine drip - Target BP 160-70s/90s (25% drop) - Cardiac monitoring - Stat MRI - Daily weights - Ins and outs - Low sodium diet - Aspirin 81 mg daily - Clonidine will not be the best choice for this patient going forward due to rebound hypertension affects  #Left wrist arthralgia - Patient presents with left wrist arthralgia and swelling. She has a history of gout and was  recently taken off her colchicine due to chronic renal failure. However, joint aspiration yielded a small amount of synovial fluid which was negative for crystals. Septic arthritis is a concern given significant pain and decreased range of motion, though she was a poor participant in the exam. However, she is afebrile and without leukocytosis. She has a significant infectious disease history, including HIV which is well controlled, and syphilis. Both of these infections can cause arthralgias alone. She has a past history of gonorrhea and Chlamydia but denies recent sexual contact. We will rule out gonococcal arthritis. Tickborne illness seems unlikely as she denies significant outdoor activities, denies insect bite or rash, and has not traveled in the [place]. ESR is elevated at 132. - Admit to IMTS, step down unit - Follow up synovial fluid culture, gonococcal culture, cell count with differential - Urine PCR probe for GC/chlamydia - Rectal and throat swab for GC/chlamydia - B. burgdorferi antibodies - Rheumatoid factor - ANA - Followup hepatic function panel - Continue vancomycin and ceftriaxone per pharmacy for now (started in the ED) - Consider infectious disease consult in the morning - Pain control with home Norco 10-325 every 6 hours when necessary - CBC, BMP in am  #History of herpes simplex - Patient denies active lesions.  - Continue acyclovir 800 mg daily.  #Seizure disorder - Continue Keppra 1500 mg twice a day, phenytoin 100 mg 3 times a day, Ativan 1 mg daily. - Check Keppra and Dilantin levels in the morning since we suspect medication noncompliance  #CKD stage 3 - Creatinine currently 1.6, which is her baseline. - Continue to monitor  #HIV - Last CD4 490, VL <20 on 05/07/13. She established care with Dr. Drue Second on 05/21/13. 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 - Continue Kaletra 200-50 mg 2 tablets twice a day, Truvada 200-300 mg one tablet daily  #History of  syphilis - Per Dr. Feliz Beam note, she was treated many years ago and there is no current need to treat. Labs from 05/07/13 show RPR reactive, titer 1:1, T pallidum antibodies >8.00. - Consider infectious disease consult in the morning as above  #Diabetes - Well-controlled, most recent A1c was 5.0%.  #GERD - Protonix 40 mg daily.  #DVT PPX - Heparin subcutaneous.  Dispo: Disposition is deferred at this time, awaiting improvement of current medical problems. Anticipated discharge in approximately 1-3 day(s).   The patient does have a current PCP Christen Bame, MD) and does need an [facility] hospital follow-up appointment after discharge.  The patient does not have transportation limitations that hinder transportation to clinic appointments.  Signed: Vivi Barrack, MD 05/28/2013,  6:33 PM

## 2013-05-28 NOTE — ED Notes (Signed)
Per EMS: pt c/o lower back and left arm pain x 1 week; pt seen here recently for same and here for further eval

## 2013-05-28 NOTE — Progress Notes (Signed)
ANTIBIOTIC CONSULT NOTE - INITIAL  Pharmacy Consult for vancomycin and ceftriaxone Indication: r/o septic joint  Allergies  Allergen Reactions  . Norvasc [Amlodipine Besylate]     Itching, rash , hives .   [name] Morphine And Related Hives, Itching and Rash    Patient Measurements: Height: 5' 5.35" (166 cm) Weight: 198 lb 6.6 oz (90 kg) IBW/kg (Calculated) : 57.81 Adjusted body weight: 67.5 kg  Vital Signs: Temp: 99.1 F (37.3 C) (12/30 1204) Temp src: Rectal (12/30 1204) BP: 193/89 mmHg (12/30 1500) Pulse Rate: 94 (12/30 1500) Intake/Output from previous day:   Intake/Output from this shift:    Labs:  Recent Labs  05/28/13 1046  WBC 6.4  HGB 9.0*  PLT 200  CREATININE 1.61*   Estimated Creatinine Clearance: 44.6 ml/min (by C-G formula based on Cr of 1.61). No results found for this basename: Rolm Gala, VANCORANDOM, GENTTROUGH, GENTPEAK, GENTRANDOM, TOBRATROUGH, TOBRAPEAK, TOBRARND, AMIKACINPEAK, AMIKACINTROU, AMIKACIN,  in the last 72 hours   Microbiology: Recent Results (from the past 720 hour(s))  URINE CULTURE     Status: None   Collection Time    05/10/13  6:08 PM      Result Value Range Status   Specimen Description URINE, CLEAN CATCH   Final   Special Requests NONE   Final   Culture  Setup Time     Final   Value: 05/10/2013 19:43     Performed at [facility] Count     Final   Value: >=100,000 COLONIES/ML     Performed at [facility]   Culture     Final   Value: ESCHERICHIA COLI     Performed at [facility]   Report Status 05/12/2013 FINAL   Final   Organism ID, Bacteria ESCHERICHIA COLI   Final  MRSA PCR SCREENING     Status: None   Collection Time    05/10/13  7:36 PM      Result Value Range Status   MRSA by PCR NEGATIVE  NEGATIVE Final   Comment:            The GeneXpert MRSA Assay (FDA     approved for NASAL specimens     only), is one component of a     comprehensive MRSA colonization   surveillance program. It is not     intended to diagnose MRSA     infection nor to guide or     monitor treatment for     MRSA infections.  CLOSTRIDIUM DIFFICILE BY PCR     Status: None   Collection Time    05/11/13  2:03 PM      Result Value Range Status   C difficile by pcr NEGATIVE  NEGATIVE Final    Medical History: Past Medical History  Diagnosis Date  . Seizures   . Stroke   . Meningitis   . Diabetes mellitus without complication   . HIV (human immunodeficiency virus infection)   . Hypertension   . Gout   . Muscle spasms of head and/or neck     Medications:  Scheduled:  . cloNIDine  0.2 mg Oral BID  . furosemide  40 mg Oral Daily  . hydrALAZINE  50 mg Oral TID  . labetalol  100 mg Oral BID  . lisinopril  40 mg Oral Daily   Infusions:  . vancomycin 1,500 mg (05/28/13 1454)   [name] presented with recurrent wrist pain.  Present pain gradually  worsening since 2 days prior.  Patient does have history of Gout, but no crystals found in arthrocentesis aspirate.  She is also HIV+.  Until gram stain, covering empirically with vancomycin and ceftriaxone.  Initial SCr is 1.61 (recent admissions show a range of 1.47 - 1.92) with current estimated CrCl ~45.  WBC is wnl and patient is afebrile.  Last CD4 count 490 on 12/9.  Cultures are pending.  Patient received vancomycin IV 1500mg  x1 dose in the ER.  Goal of Therapy:  Vancomycin trough level 15-20 mcg/ml Resolution of possible infection  Plan:  - start maintenance dose (12 hours after 1500mg  loading dose) at vancomycin IV 750mg  q12h  - plan to assess vancomycin trough at [facility] if continued - start ceftriaxone IV 2g every 24h - monitor kidney function and clinical progress - monitor for possible narrowing of therapy once gram stain  from arthrocentesis aspirate results  Michelle [name]. Achilles Dunk, PharmD Clinical Pharmacist - Resident Pager: [phone] Pharmacy: [phone] 05/28/2013 4:13 PM

## 2013-05-28 NOTE — ED Notes (Signed)
Patient has sudden onset of dysarthria, immediately informed Dr. Gwendolyn Grant.

## 2013-05-28 NOTE — ED Notes (Signed)
Patient presents to ED c/o left wrist pain, back pain, and headache.  Wrist pain and headache x 2 weeks, back pain is chronic.  Patient recently discharged from this hospital.  She has hx of HIV, seizures,  CKD, CVA, migraines, macrocytic anemia.

## 2013-05-28 NOTE — ED Provider Notes (Signed)
Subjective: This is a 52 year old female with a past medical history of seizures, HIV, diabetes, previous stroke, hypertension who presents emergency department chief complaint of left wrist and hand pain.  She states it began yesterday.  She has acute pain, heat, redness, swelling of the left wrist and hand.  She states it feels the same as her previous gout flair however it has always been in her feet.  The patient was recently discharged from the hospital for similar symptoms.  Objective.:  The patient appears to be in acute pain.  She is exquisitely tender to palpation in the right left wrist.  She is guarding the rest.  She is palpable pulse.  It is warm, swollen, erythematous.  She denies any numbness or tingling in the hand.  Range of motion limited due to severe pain.  Assessment and plan: This is HIV +52 year old female who likely has flare of gout in her left wrist however cannot rule out septic joint at this point.  Patient will be moved to a higher acuity, x-Brakebill, CRP, sedimentation rate, labs, peripheral IV insertion and pain control initiated the  Arthor Captain, PA-C 05/28/13 1010

## 2013-05-28 NOTE — ED Notes (Signed)
Internal medicine at bedside

## 2013-05-28 NOTE — ED Provider Notes (Addendum)
CSN: [id number]     Arrival date & time 05/28/13  0810 History   First MD Initiated Contact with Patient 05/28/13 [phone]     Chief Complaint  Patient presents with  . Back Pain  . Arm Pain   (Consider location/radiation/quality/duration/timing/severity/associated sxs/prior Treatment) HPI Comments: Hx of gout, has had L wrist arthralgia previously, worsened.   Patient is a 52 y.o. female presenting with arm pain and wrist pain. The history is provided by the patient.  Arm Pain Pertinent negatives include no chest pain, no abdominal pain and no shortness of breath.  Wrist Pain This is a recurrent problem. The current episode started more than 2 days ago. The problem occurs constantly. The problem has been gradually worsening. Pertinent negatives include no chest pain, no abdominal pain and no shortness of breath. Nothing aggravates the symptoms. Nothing relieves the symptoms.    Past Medical History  Diagnosis Date  . Seizures   . Stroke   . Meningitis   . Diabetes mellitus without complication   . HIV (human immunodeficiency virus infection)   . Hypertension   . Gout   . Muscle spasms of head and/or neck    History reviewed. No pertinent past surgical history. Family History  Problem Relation Age of Onset  . Cancer - Colon Mother   . Cancer Father   . Diabetes    . Hypertension Father    History  Substance Use Topics  . Smoking status: Former Smoker    Types: Cigarettes  . Smokeless tobacco: Never Used  . Alcohol Use: No   OB History   Grav Para Term Preterm Abortions TAB SAB Ect Mult Living                 Review of Systems  Respiratory: Negative for cough and shortness of breath.   Cardiovascular: Negative for chest pain.  Gastrointestinal: Negative for abdominal pain.  All other systems reviewed and are negative.    Allergies  Norvasc and Morphine and related  Home Medications   Current Outpatient Rx  Name  Route  Sig  Dispense  Refill  . acyclovir  (ZOVIRAX) 800 MG tablet   Oral   Take 800 mg by mouth daily.         [name] aspirin EC 81 MG tablet   Oral   Take 81 mg by mouth daily.         . Blood Glucose Monitoring Suppl (ACCU-CHEK AVIVA PLUS) W/DEVICE KIT      USE AS DIRECTED   1 kit   0   . cholecalciferol (VITAMIN D) 1000 UNITS tablet   Oral   Take 1,000 Units by mouth daily.         . cloNIDine (CATAPRES) 0.2 MG tablet   Oral   Take 1 tablet (0.2 mg total) by mouth 2 (two) times daily.   60 tablet   3   . emtricitabine-tenofovir (TRUVADA) 200-300 MG per tablet   Oral   Take 1 tablet by mouth every other day.          . furosemide (LASIX) 40 MG tablet   Oral   Take 1 tablet (40 mg total) by mouth daily.   30 tablet   3   . hydrALAZINE (APRESOLINE) 50 MG tablet   Oral   Take 1 tablet (50 mg total) by mouth 3 (three) times daily.   90 tablet   3   . HYDROcodone-acetaminophen (NORCO) 10-325 MG per tablet   Oral  Take 1 tablet by mouth every 6 (six) hours as needed.         . labetalol (NORMODYNE) 100 MG tablet   Oral   Take 1 tablet (100 mg total) by mouth 2 (two) times daily.   60 tablet   3   . levETIRAcetam (KEPPRA) 500 MG tablet   Oral   Take 1,500 mg by mouth every 12 (twelve) hours.         [name] lisinopril (PRINIVIL,ZESTRIL) 40 MG tablet   Oral   Take 1 tablet (40 mg total) by mouth daily.   30 tablet   3   . lopinavir-ritonavir (KALETRA) 200-50 MG per tablet   Oral   Take 2 tablets by mouth 2 (two) times daily.         [name] LORazepam (ATIVAN) 1 MG tablet   Oral   Take 1 mg by mouth daily. Take one tablet for seizure prevention.         . methocarbamol (ROBAXIN) 500 MG tablet   Oral   Take 1,000 mg by mouth 2 (two) times daily. Take two 500 mg tablets by mouth four times daily         . pantoprazole (PROTONIX) 40 MG tablet   Oral   Take 40 mg by mouth daily.         . phenytoin (DILANTIN) 50 MG tablet   Oral   Chew 2 tablets (100 mg total) by mouth 3 (three) times  daily.   90 tablet   2   . predniSONE (DELTASONE) 20 MG tablet   Oral   Take 1 tablet (20 mg total) by mouth daily with breakfast.   3 tablet   0   . triamcinolone cream (KENALOG) 0.1 %   Topical   Apply 1 application topically 2 (two) times daily as needed. For rash.          BP 191/87  Pulse 81  Temp(Src) 99.5 F (37.5 C) (Oral)  Resp 18  SpO2 100% Physical Exam  Nursing note and vitals reviewed. Constitutional: She is oriented to person, place, and time. She appears well-developed and well-nourished. No distress.  HENT:  Head: Normocephalic and atraumatic.  Eyes: EOM are normal. Pupils are equal, round, and reactive to light.  Neck: Normal range of motion. Neck supple.  Cardiovascular: Normal rate and regular rhythm.  Exam reveals no friction rub.   No murmur heard. Pulmonary/Chest: Effort normal and breath sounds normal. No respiratory distress. She has no wheezes. She has no rales.  Abdominal: Soft. She exhibits no distension. There is no tenderness. There is no rebound.  Musculoskeletal: She exhibits no edema.       Left wrist: She exhibits decreased range of motion, tenderness, bony tenderness, swelling and effusion (mild).  Neurological: She is alert and oriented to person, place, and time.  Skin: She is not diaphoretic.    ED Course  ARTHOCENTESIS Date/Time: 05/28/2013 1:39 PM Performed by: Dagmar Hait Authorized by: Dagmar Hait Consent: Verbal consent obtained. written consent obtained. Time out: Immediately prior to procedure a "time out" was called to verify the correct patient, procedure, equipment, support staff and site/side marked as required. Indications: joint swelling and possible septic joint  Body area: wrist Joint: left wrist Local anesthesia used: no Patient sedated: no Preparation: Patient was prepped and draped in the usual sterile fashion. Needle gauge: 22 G Approach: posterior Aspirate: blood-tinged Aspirate  amount: 1 ml Patient tolerance: Patient tolerated the procedure well with no  immediate complications.   (including critical care time) Labs Review Labs Reviewed  C-REACTIVE PROTEIN - Abnormal; Notable for the following:    CRP <0.5 (*)    All other components within normal limits  SEDIMENTATION RATE - Abnormal; Notable for the following:    Sed Rate 132 (*)    All other components within normal limits  CBC WITH DIFFERENTIAL - Abnormal; Notable for the following:    RBC 2.70 (*)    Hemoglobin 9.0 (*)    HCT 27.7 (*)    MCV 102.6 (*)    All other components within normal limits  BASIC METABOLIC PANEL - Abnormal; Notable for the following:    Glucose, Bld 133 (*)    Creatinine, Ser 1.61 (*)    Calcium 8.3 (*)    GFR calc non Af Amer 35 (*)    GFR calc Af Amer 41 (*)    All other components within normal limits  BODY FLUID CULTURE  GC/CHLAMYDIA PROBE AMP  CULTURE, BLOOD (ROUTINE X 2)  CULTURE, BLOOD (ROUTINE X 2)  SYNOVIAL FLUID, CRYSTAL  SYNOVIAL FLUID, CRYSTAL  SYNOVIAL CELL COUNT + DIFF, W/ CRYSTALS   Imaging Review Dg Wrist Complete Left  05/28/2013   CLINICAL DATA:  Pain and swelling.  Limited range of motion.  EXAM: LEFT WRIST - COMPLETE 3+ VIEW  COMPARISON:  None.  FINDINGS: The bones appear somewhat osteopenic. There is no evidence of fracture, dislocation, degenerative change or other focal finding.  IMPRESSION: Question osteopenia.  Otherwise normal radiographs.   Electronically Signed   By: Paulina Fusi M.D.   On: 05/28/2013 10:33    EKG Interpretation   None      CRITICAL CARE Performed by: Dagmar Hait   Total critical care time: 30 minutes  Critical care time was exclusive of separately billable procedures and treating other patients.  Critical care was necessary to treat or prevent imminent or life-threatening deterioration.  Critical care was time spent personally by me on the following activities: development of treatment plan with patient  and/or surrogate as well as nursing, discussions with consultants, evaluation of patient's response to treatment, examination of patient, obtaining history from patient or surrogate, ordering and performing treatments and interventions, ordering and review of laboratory studies, ordering and review of radiographic studies, pulse oximetry and re-evaluation of patient's condition.  MDM   1. Hypertensive emergency   2. Septic arthritis    18F presents with L wrist pain. Recently admitted for gout flare. Was noted to have L wrist arthralgia, but arthrocentesis of wrist was not performed. She was treated for gout flares in her feet.  Denies fevers, vomiting. Has hx of HTN and known resistant HTN, systolics in the low 200s/upper 190s at home.  On exam, vitals show hypertension, otherwise normal. L wrist with mild effusion, warmth, decreased ROM. I will perform L wrist arthrocentesis here.  Wrist arthrocentesis yielded ~1cc of synovial fluid and blood. No crystals seen. Will admit for possible septic joint. Vancomycin initiated.  While awaiting admission patient became acutely hypotensive and confused. Patient's slurred speech, but is not making sense when she speaks. She is following commands are minimally. Her present pressures are 210s/100s. Labetalol drip initiated care spoke with the internal medicine teaching service will change her status.  Dagmar Hait, MD 05/28/13 1527  Dagmar Hait, MD 06/01/13 [phone]

## 2013-05-29 ENCOUNTER — Encounter (HOSPITAL_COMMUNITY): Payer: Self-pay

## 2013-05-29 DIAGNOSIS — I674 Hypertensive encephalopathy: Secondary | ICD-10-CM | POA: Diagnosis present

## 2013-05-29 LAB — CBC
HCT: 25.8 % — ABNORMAL LOW (ref 36.0–46.0)
Hemoglobin: 8.5 g/dL — ABNORMAL LOW (ref 12.0–15.0)
MCH: 33.7 pg (ref 26.0–34.0)
MCHC: 32.9 g/dL (ref 30.0–36.0)
Platelets: 175 10*3/uL (ref 150–400)

## 2013-05-29 LAB — BASIC METABOLIC PANEL
BUN: 22 mg/dL (ref 6–23)
Calcium: 7.8 mg/dL — ABNORMAL LOW (ref 8.4–10.5)
GFR calc non Af Amer: 37 mL/min — ABNORMAL LOW (ref >90)
Glucose, Bld: 97 mg/dL (ref 70–99)
Potassium: 3.8 mEq/L (ref 3.7–5.3)

## 2013-05-29 LAB — GC/CHLAMYDIA PROBE AMP: GC Probe RNA: NEGATIVE

## 2013-05-29 LAB — GLUCOSE, CAPILLARY
Glucose-Capillary: 109 mg/dL — ABNORMAL HIGH (ref 70–99)
Glucose-Capillary: 151 mg/dL — ABNORMAL HIGH (ref 70–99)

## 2013-05-29 LAB — PHENYTOIN LEVEL, TOTAL: Phenytoin Lvl: <2.5 ug/mL — ABNORMAL LOW (ref 10.0–20.0)

## 2013-05-29 LAB — ANA: Anti Nuclear Antibody(ANA): POSITIVE — AB

## 2013-05-29 LAB — ANTI-NUCLEAR AB-TITER (ANA TITER): ANA Titer 1: 1:160 {titer} — ABNORMAL HIGH

## 2013-05-29 MED ORDER — CLONIDINE HCL 0.2 MG PO TABS
0.2000 mg | ORAL_TABLET | Freq: Three times a day (TID) | ORAL | Status: DC
  Administered 2013-05-29 (×2): 0.2 mg via ORAL
  Filled 2013-05-29 (×6): qty 1

## 2013-05-29 MED ORDER — LABETALOL HCL 5 MG/ML IV SOLN
10.0000 mg | INTRAVENOUS | Status: DC | PRN
  Filled 2013-05-29: qty 4

## 2013-05-29 MED ORDER — ACETAMINOPHEN 325 MG PO TABS
650.0000 mg | ORAL_TABLET | Freq: Four times a day (QID) | ORAL | Status: DC | PRN
  Administered 2013-05-29 – 2013-05-30 (×2): 650 mg via ORAL
  Filled 2013-05-29 (×2): qty 2

## 2013-05-29 MED ORDER — HYDRALAZINE HCL 20 MG/ML IJ SOLN: 5.0000 mg | INTRAMUSCULAR | Status: DC | PRN

## 2013-05-29 MED ORDER — HYDRALAZINE HCL 20 MG/ML IJ SOLN: 20.0000 mg | INTRAMUSCULAR | Status: DC | PRN

## 2013-05-29 NOTE — ED Provider Notes (Signed)
Medical screening examination/treatment/procedure(s) were performed by non-physician practitioner and as supervising physician I was immediately available for consultation/collaboration.  EKG Interpretation    Date/Time:    Ventricular Rate:    PR Interval:    QRS Duration:   QT Interval:    QTC Calculation:   R Axis:     Text Interpretation:                Benny Lennert, MD 05/29/13 1454

## 2013-05-29 NOTE — Progress Notes (Signed)
Patient requires a two person assist to transfer from bed to chair.  She complains of pain to her left foot and left hand/wrist.  Md is aware.

## 2013-05-29 NOTE — Progress Notes (Signed)
Report was called to RN on unit 2 [place]. Patient will transfer to room 2 [facility] Room 36.  Family was notified of pending transfer.

## 2013-05-29 NOTE — Progress Notes (Signed)
Utilization Review Completed.Michelle [name] T12/31/2014  

## 2013-05-29 NOTE — Progress Notes (Signed)
INTERNAL MEDICINE TEACHING SERVICE TRANSFER NOTE  Interval History: Michelle [name] is a 52 year old [place] female with a PMH of HTN, CKD stage 3 (baseline Cr 1.7) , DMT2 (A1C 5.0 on 05/14/13), HIV (Last CD4 490, VL<20 on 05/07/13), Seizure disorder, migraines, gout.  She also has a history of gonorrhea, chlamydia, herpes, syphilis, and a previous history of IV drug use. She initially presented to [facility] with left elbow, wrist, hand and finger pain that prevented her from moving her arms.  She reported that the pain had been ongoing for the last 2 weeks.  She was initially found to have Hypertensive encephalopathy and care was transferred on 05/28/13. She was started on a Caradene drip where BP was controlled and the drip was discontinued.  For possible septic arthritis of left wrist Vancomycin and rocephin were continued and she was transferred back to [facility]MT[facility]. Currently Michelle [name] reports that she can barely move her left arm and she does not believe she has seen much improvement since starting the antibiotics.  In addition she reports a lesser degree of pain in her right wrist and her left ankle.  She does admit a mild HA and a pain behind her left ear, as well as some neck stiffness.   Meds: Prescriptions prior to admission  Medication Sig Dispense Refill  . acyclovir (ZOVIRAX) 800 MG tablet Take 800 mg by mouth daily.      [name] aspirin EC 81 MG tablet Take 81 mg by mouth daily.      . Blood Glucose Monitoring Suppl (ACCU-CHEK AVIVA PLUS) W/DEVICE KIT USE AS DIRECTED  1 kit  0  . cholecalciferol (VITAMIN D) 1000 UNITS tablet Take 1,000 Units by mouth daily.      . cloNIDine (CATAPRES) 0.2 MG tablet Take 1 tablet (0.2 mg total) by mouth 2 (two) times daily.  60 tablet  3  . emtricitabine-tenofovir (TRUVADA) 200-300 MG per tablet Take 1 tablet by mouth every other day.       . furosemide (LASIX) 40 MG tablet Take 1 tablet (40 mg total) by mouth daily.  30 tablet  3  . hydrALAZINE (APRESOLINE) 50 MG  tablet Take 1 tablet (50 mg total) by mouth 3 (three) times daily.  90 tablet  3  . HYDROcodone-acetaminophen (NORCO) 10-325 MG per tablet Take 1 tablet by mouth every 6 (six) hours as needed.      . labetalol (NORMODYNE) 100 MG tablet Take 1 tablet (100 mg total) by mouth 2 (two) times daily.  60 tablet  3  . levETIRAcetam (KEPPRA) 500 MG tablet Take 1,500 mg by mouth every 12 (twelve) hours.      [name] lisinopril (PRINIVIL,ZESTRIL) 40 MG tablet Take 1 tablet (40 mg total) by mouth daily.  30 tablet  3  . lopinavir-ritonavir (KALETRA) 200-50 MG per tablet Take 2 tablets by mouth 2 (two) times daily.      [name] LORazepam (ATIVAN) 1 MG tablet Take 1 mg by mouth daily. Take one tablet for seizure prevention.      . methocarbamol (ROBAXIN) 500 MG tablet Take 1,000 mg by mouth 2 (two) times daily. Take two 500 mg tablets by mouth four times daily      . pantoprazole (PROTONIX) 40 MG tablet Take 40 mg by mouth daily.      . phenytoin (DILANTIN) 50 MG tablet Chew 2 tablets (100 mg total) by mouth 3 (three) times daily.  90 tablet  2  . predniSONE (DELTASONE) 20 MG tablet  Take 1 tablet (20 mg total) by mouth daily with breakfast.  3 tablet  0  . triamcinolone cream (KENALOG) 0.1 % Apply 1 application topically 2 (two) times daily as needed. For rash.        Allergies: Allergies as of 05/28/2013 - Review Complete 05/28/2013  Allergen Reaction Noted  . Norvasc [amlodipine besylate]  03/30/2013  . Morphine and related Hives, Itching, and Rash 02/15/2013   Past Medical History  Diagnosis Date  . Seizures   . Stroke   . Meningitis   . Diabetes mellitus without complication   . HIV (human immunodeficiency virus infection)   . Hypertension   . Gout   . Muscle spasms of head and/or neck    History reviewed. No pertinent past surgical history. Family History  Problem Relation Age of Onset  . Cancer - Colon Mother   . Cancer Father   . Diabetes    . Hypertension Father    History   Social History  .  Marital Status: Single    Spouse Name: N/A    Number of Children: 4  . Years of Education: 11th   Occupational History  . Not on file.   Social History Main Topics  . Smoking status: Former Smoker    Types: Cigarettes  . Smokeless tobacco: Never Used  . Alcohol Use: No  . Drug Use: No     Comment: past history of alcohol, cocaine and IV drug use  . Sexual Activity: Not Currently    Partners: Male     Comment: given condoms   Other Topics Concern  . Not on file   Social History Narrative   Patient lives at home with sister.    Patient is unemployed.    Patient is single.    Patient has 2 alive and 2 deceased.    Patient has 11th grade education.          Review of Systems: Review of Systems  Constitutional: Positive for fever.  Eyes: Negative for blurred vision.  Respiratory: Negative for shortness of breath.   Cardiovascular: Negative for chest pain.  Gastrointestinal: Negative for abdominal pain.  Genitourinary: Negative for dysuria and frequency.  Musculoskeletal: Positive for joint pain and neck pain.  Skin: Negative for rash.  Neurological: Positive for headaches. Negative for sensory change, focal weakness and seizures.   Physical Exam: Blood pressure 142/74, pulse 107, temperature 99.8 F (37.7 C), temperature source Oral, resp. rate 11, height 5\' 7"  (1.702 m), weight 179 lb 7.3 oz (81.4 kg), SpO2 97.00%. Physical Exam  Nursing note and vitals reviewed. Constitutional: She is oriented to person, place, and time and well-developed, well-nourished, and in no distress.  HENT:  Head: Normocephalic and atraumatic.  No appreciated Tick or tick bite on scalp.  Eyes: Conjunctivae and EOM are normal.  Neck: Normal range of motion. Neck supple.  Cardiovascular: Regular rhythm and intact distal pulses.  Tachycardia present.   No murmur heard. Pulmonary/Chest: Effort normal and breath sounds normal. No respiratory distress. She has no wheezes. She has no rales.   Abdominal: Soft. Bowel sounds are normal. She exhibits no distension. There is no tenderness. There is no rebound.  Neurological: She is alert and oriented to person, place, and time.  Brudzinski and Kernig signs negative. Movement in all 4 extremities.  Skin: Skin is warm. No rash noted.     Lab results: Basic Metabolic Panel:  Recent Labs  30/16/01 1046 05/28/13 1930 05/29/13 0430  NA 140  --  139  K 4.2  --  3.8  CL 105  --  106  CO2 23  --  19  GLUCOSE 133*  --  97  BUN 22  --  22  CREATININE 1.61*  --  1.55*  CALCIUM 8.3*  --  7.8*  MG  --  1.4*  --    Liver Function Tests:  Recent Labs  05/28/13 1930  AST 21  ALT 21  ALKPHOS 132*  BILITOT 1.3*  PROT 7.2  ALBUMIN 2.2*   CBC:  Recent Labs  05/28/13 1046 05/29/13 0430  WBC 6.4 8.8  NEUTROABS 4.7  --   HGB 9.0* 8.5*  HCT 27.7* 25.8*  MCV 102.6* 102.4*  PLT 200 175   CBG:  Recent Labs  05/28/13 1645 05/28/13 2025 05/28/13 2345 05/29/13 0409 05/29/13 0755  GLUCAP 89 137* 151* 109* 109*   Other Results - L wrist synovial fluid culture 05/28/13: NGTD, gonococcal culture in process. - Urine PCR probe for GC/chlamydia: Negative - Rectal and throat swab for GC/chlamydia in process - B. burgdorferi antibodies 2.64 - Rheumatoid factor <10 - ANA positive 1:160  Urine Drug Screen: Drugs of Abuse     Component Value Date/Time   LABOPIA POSITIVE* 05/28/2013 1737   COCAINSCRNUR NONE DETECTED 05/28/2013 1737   LABBENZ NONE DETECTED 05/28/2013 1737   AMPHETMU NONE DETECTED 05/28/2013 1737   THCU NONE DETECTED 05/28/2013 1737   LABBARB NONE DETECTED 05/28/2013 1737    Imaging results:  Dg Wrist Complete Left  05/28/2013   CLINICAL DATA:  Pain and swelling.  Limited range of motion.  EXAM: LEFT WRIST - COMPLETE 3+ VIEW  COMPARISON:  None.  FINDINGS: The bones appear somewhat osteopenic. There is no evidence of fracture, dislocation, degenerative change or other focal finding.  IMPRESSION: Question  osteopenia.  Otherwise normal radiographs.   Electronically Signed   By: Paulina Fusi M.D.   On: 05/28/2013 10:33   Ct Head Wo Contrast  05/28/2013   CLINICAL DATA:  Back and arm pain  EXAM: CT HEAD WITHOUT CONTRAST  TECHNIQUE: Contiguous axial images were obtained from the base of the skull through the vertex without intravenous contrast.  COMPARISON:  02/14/2013  FINDINGS: Skull and Sinuses:No significant abnormality.  Orbits: No acute abnormality.  Brain: No evidence of acute abnormality, such as acute infarction, hemorrhage, hydrocephalus, or mass lesion/mass effect. Generalized atrophy, age advanced. Stable pattern of chronic small vessel ischemic injury, with periventricular white matter low attenuation.  IMPRESSION: 1. No evidence of acute intracranial disease. 2. Generalized brain atrophy.   Electronically Signed   By: Tiburcio Pea M.D.   On: 05/28/2013 17:13    Assessment & Plan by Problem:   Hypertensive encephalopathy resolved -Continue Clonidine 0.2mg  TID, Lasix 40mg  daily, Hydralazine 50mg  TID, Labetalol 100mg  BID, Lisinopril 40mg  daily -PRN Hydralazine5mg  for BP >180/90    Left Wrist arthralgia/Polyarthralgia Patient has a history of gout arthritis, however given her spiking fevers and extensive infectious disease history, infectious causes need to be considered.  Will continue to follow up joint aspiration labs drawn in the ED, as well as swabs for GC.  Patient has been admitted twice for hypertensive encephalopathy in the last month and B. burgdorferi antibodies 2.64.  She has received 2 days of Ceftriaxone which would cover for lyme disease with neurologic complications.  In addition given her history Gonococcal arthritis is a concern however GC swab probe done on this admission is negative.   - Repeat Blood Cultures as patient spiked  a fever of 101.7 on antibiotics. - Continue vancomycin and ceftriaxone per pharmacy - Infectious disease consult in the morning     HIV disease -  Follows with Dr Drue Second at [facility] -Well controlled continue Truvada, Kaletra    CKD (chronic kidney disease) stage 3, GFR 30-59 ml/min -Creatinine 1.55 roughly at baseline.    Diabetes Mellitus Type II -Well controlled.  No medications at this time.  Hepatitis C, Hepatitis B -Recently established care with [facility] - AST 21, ALT 22, Alk Phos 132 on 05/28/13  Hypoproliferative Macrocytic Anemia -Hgb 8.8, MCV 102.4, Folate 10.4, B12 488, Iron 40, TIBC 251, reticulocyte count: 2.5 on 05/14/13. - May be secondary to HIV therapy vs phenytonin  Dispo: Disposition is deferred at this time, awaiting improvement of current medical problems.    The patient does have a current PCP Christen Bame, MD) and does need an [facility] hospital follow-up appointment after discharge.  The patient does not know have transportation limitations that hinder transportation to clinic appointments.  Signed: Carlynn Purl, DO 05/29/2013, 2:25 PM

## 2013-05-29 NOTE — H&P (Signed)
Michelle [name] was transferred to the ICU team and PCCM took over as attending prior to AM rounds. IM will resume care of pt when she is able to be discharged from the ICU.

## 2013-05-29 NOTE — Consult Note (Signed)
PULMONARY  / CRITICAL CARE MEDICINE  Name: Michelle [name] MRN: [id number] DOB: 01-Aug-1960    ADMISSION DATE:  05/28/2013 CONSULTATION DATE:  05/28/2013  REFERRING MD :  Blanch Media PRIMARY SERVICE: PCCM   CHIEF COMPLAINT:  Hypertensive Emergency   BRIEF PATIENT DESCRIPTION:  52 y/o female presented to ED with L wrist pain.  She developed acute encephalopathy from malignant HTN with BP of 217/103.  She was transferred to ICU for Cardene gtt, and PCCM assumed care from IMTS.  She had similar admission on 05/11/13.  SIGNIFICANT EVENTS / STUDIES: 12/30   L wrist arthrocentesis >>> no crystals 12/30  CT head >>> generalized atrophy, no acute findings  LINES / TUBES: Foley 12/30 >>> 12/31  CULTURES: 12/30  MRSA >>> neg 12/30  GC >>> 12/30  Chlamydia >>> 12/30  Blood >>>  ANTIBIOTICS: Ceftriaxone 12/30 >>> Vancomycin 12/30 >>>  INTERVAL HISTORY:  Better BP control overnight.  VITAL SIGNS: Temp:  [99 F (37.2 C)-101.1 F (38.4 C)] 99 F (37.2 C) (12/31 0800) Pulse Rate:  [79-117] 101 (12/31 0700) Resp:  [13-35] 19 (12/31 0700) BP: (127-224)/(63-112) 160/79 mmHg (12/31 0700) SpO2:  [97 %-100 %] 97 % (12/31 0700) Weight:  [81.4 kg (179 lb 7.3 oz)-90 kg (198 lb 6.6 oz)] 81.4 kg (179 lb 7.3 oz) (12/31 0500)  INTAKE / OUTPUT: Intake/Output     12/30 0701 - 12/31 0700 12/31 0701 - 01/01 0700   P.O. 200    I.V. (mL/kg) 369.2 (4.5)    IV Piggyback 150    Total Intake(mL/kg) 719.2 (8.8)    Urine (mL/kg/hr) 845    Total Output 845     Net -125.8          Stool Occurrence 1 x     PHYSICAL EXAMINATION: General: No distress Neuro: Awake, alert, cooperative HEENT: PERRL Cardiovascular: Tachycardic, regular Lungs: CTAB Abdomen: Soft, no tender Musculoskeletal: L wrist edema / tenderness Skin: no rashes  LABS:  CBC  Recent Labs Lab 05/28/13 1046 05/29/13 0430  WBC 6.4 8.8  HGB 9.0* 8.5*  HCT 27.7* 25.8*  PLT 200 175   Coag's No results found for this  basename: APTT, INR,  in the last 168 hours BMET  Recent Labs Lab 05/28/13 1046 05/29/13 0430  NA 140 139  K 4.2 3.8  CL 105 106  CO2 23 19  BUN 22 22  CREATININE 1.61* 1.55*  GLUCOSE 133* 97   Electrolytes  Recent Labs Lab 05/28/13 1046 05/28/13 1930 05/29/13 0430  CALCIUM 8.3*  --  7.8*  MG  --  1.4*  --    Liver Enzymes  Recent Labs Lab 05/28/13 1930  AST 21  ALT 21  ALKPHOS 132*  BILITOT 1.3*  ALBUMIN 2.2*   Glucose  Recent Labs Lab 05/28/13 1645 05/28/13 2025 05/28/13 2345 05/29/13 0409 05/29/13 0755  GLUCAP 89 137* 151* 109* 109*   CXR: None today  ASSESSMENT / PLAN:  PULMONARY A:   No active issues. P:   Goal SpO2>92 Supplemental oxygen PRN  CARDIOVASCULAR A:   Malignant hypertension, likely related to medical non compliance. P:  Gaol BP<180/90 ASA Clonidine 02 TID Hydralazine 50 tid Labetalol 100 bid Lisinopril 40 daily Add Labetalol 10 q4h PRN Add Hydralazine 20 q4h PRN D/c Cardene  RENAL A:   Chronic kidney disease stage 3. P:   Trend BMP Lasix 40 daily D/c Foley  GASTROINTESTINAL A:   Nutrition. GERD. P:   Diet as tolerated Continue protonix  HEMATOLOGIC A:  Anemia of chronic disease. VTE Px. P:  Trend CBC Heparin Dana  INFECTIOUS A:   Possible septic arthritis L wrist. Hx of HIV, HSV, syphilis. P:   Continue vancomycin, rocephin Continue HAART, acyclovir  ENDOCRINE A:   Normoglycemic. P:   No intervention required  NEUROLOGIC A:   Encephalopathy resolved. H/o seizures. P:   Continue keppra, ativan, dilantin Hydrocodone PRN  Downgrade to telemetry.  IMTS to assume care 1/1.  I have personally obtained history, examined patient, evaluated and interpreted laboratory and imaging results, reviewed medical records, formulated assessment / plan and placed orders.  Lonia Farber, MD Pulmonary and Critical Care Medicine [facility] Pager: [phone]  05/29/2013,  11:10 AM

## 2013-05-30 DIAGNOSIS — A499 Bacterial infection, unspecified: Secondary | ICD-10-CM

## 2013-05-30 DIAGNOSIS — I674 Hypertensive encephalopathy: Principal | ICD-10-CM

## 2013-05-30 DIAGNOSIS — B20 Human immunodeficiency virus [HIV] disease: Secondary | ICD-10-CM

## 2013-05-30 DIAGNOSIS — R509 Fever, unspecified: Secondary | ICD-10-CM

## 2013-05-30 DIAGNOSIS — I10 Essential (primary) hypertension: Secondary | ICD-10-CM

## 2013-05-30 DIAGNOSIS — R569 Unspecified convulsions: Secondary | ICD-10-CM

## 2013-05-30 DIAGNOSIS — M79609 Pain in unspecified limb: Secondary | ICD-10-CM

## 2013-05-30 DIAGNOSIS — E46 Unspecified protein-calorie malnutrition: Secondary | ICD-10-CM

## 2013-05-30 DIAGNOSIS — B9689 Other specified bacterial agents as the cause of diseases classified elsewhere: Secondary | ICD-10-CM

## 2013-05-30 DIAGNOSIS — N183 Chronic kidney disease, stage 3 unspecified: Secondary | ICD-10-CM

## 2013-05-30 DIAGNOSIS — I219 Acute myocardial infarction, unspecified: Secondary | ICD-10-CM

## 2013-05-30 DIAGNOSIS — M009 Pyogenic arthritis, unspecified: Secondary | ICD-10-CM

## 2013-05-30 DIAGNOSIS — M25439 Effusion, unspecified wrist: Secondary | ICD-10-CM

## 2013-05-30 HISTORY — DX: Acute myocardial infarction, unspecified: I21.9

## 2013-05-30 LAB — CHLAMYDIA CULTURE

## 2013-05-30 LAB — URINE MICROSCOPIC-ADD ON

## 2013-05-30 LAB — TROPONIN I
Troponin I: <0.3 ng/mL (ref <0.30)
Troponin I: <0.3 ng/mL (ref <0.30)

## 2013-05-30 LAB — URINALYSIS, ROUTINE W REFLEX MICROSCOPIC
Bilirubin Urine: NEGATIVE
GLUCOSE, UA: NEGATIVE mg/dL
Ketones, ur: NEGATIVE mg/dL
Nitrite: NEGATIVE
PH: 6 (ref 5.0–8.0)
PROTEIN: 100 mg/dL — AB
Specific Gravity, Urine: 1.02 (ref 1.005–1.030)
Urobilinogen, UA: 0.2 mg/dL (ref 0.0–1.0)

## 2013-05-30 LAB — INFLUENZA PANEL BY PCR (TYPE A & B)
H1N1FLUPCR: NOT DETECTED
INFLAPCR: NEGATIVE
INFLBPCR: NEGATIVE

## 2013-05-30 MED ORDER — FENTANYL CITRATE 0.05 MG/ML IJ SOLN
INTRAMUSCULAR | Status: AC
  Administered 2013-05-30: 25 ug via INTRAVENOUS
  Filled 2013-05-30: qty 2

## 2013-05-30 MED ORDER — NITROGLYCERIN 0.4 MG SL SUBL: 0.4000 mg | SUBLINGUAL_TABLET | SUBLINGUAL | Status: DC | PRN

## 2013-05-30 MED ORDER — HYDROCODONE-ACETAMINOPHEN 10-325 MG PO TABS
1.0000 | ORAL_TABLET | ORAL | Status: DC | PRN
  Administered 2013-05-30 – 2013-06-02 (×8): 1 via ORAL
  Filled 2013-05-30 (×8): qty 1

## 2013-05-30 MED ORDER — NITROGLYCERIN 0.4 MG SL SUBL
SUBLINGUAL_TABLET | SUBLINGUAL | Status: AC
  Filled 2013-05-30: qty 187.5

## 2013-05-30 MED ORDER — COLCHICINE 0.6 MG PO TABS
0.6000 mg | ORAL_TABLET | Freq: Every day | ORAL | Status: DC
Start: 2013-05-30 — End: 2013-05-31
  Administered 2013-05-30 – 2013-05-31 (×2): 0.6 mg via ORAL
  Filled 2013-05-30 (×2): qty 1

## 2013-05-30 MED ORDER — CLONIDINE HCL 0.2 MG PO TABS
0.2000 mg | ORAL_TABLET | Freq: Two times a day (BID) | ORAL | Status: DC
  Administered 2013-05-30 – 2013-06-02 (×7): 0.2 mg via ORAL
  Filled 2013-05-30 (×8): qty 1

## 2013-05-30 MED ORDER — FENTANYL CITRATE 0.05 MG/ML IJ SOLN
25.0000 ug | Freq: Once | INTRAMUSCULAR | Status: AC
  Administered 2013-05-30: 25 ug via INTRAVENOUS

## 2013-05-30 MED ORDER — MORPHINE SULFATE 2 MG/ML IJ SOLN
INTRAMUSCULAR | Status: AC
  Filled 2013-05-30: qty 1

## 2013-05-30 NOTE — Consult Note (Signed)
[facility]     Reason for Consult:arthritis    Referring Physician: Dr. Rogelia Boga  Principal Problem:   Left Wrist arthralgia Active Problems:   Unspecified essential hypertension   HIV disease   CKD (chronic kidney disease) stage 3, GFR 30-59 ml/min   Diabetes   Hypertensive emergency   Hypertensive urgency   Encephalopathy, hypertensive   . acyclovir  800 mg Oral Daily  . aspirin EC  81 mg Oral Daily  . cefTRIAXone (ROCEPHIN)  IV  2 g Intravenous Q24H  . cloNIDine  0.2 mg Oral BID  . emtricitabine-tenofovir  1 tablet Oral QODAY  . furosemide  40 mg Oral Daily  . heparin  5,000 Units Subcutaneous Q8H  . hydrALAZINE  50 mg Oral TID  . labetalol  100 mg Oral BID  . levETIRAcetam  1,500 mg Oral Q12H  . lisinopril  40 mg Oral Daily  . lopinavir-ritonavir  2 tablet Oral BID  . LORazepam  1 mg Oral Daily  . nitroGLYCERIN      . pantoprazole  40 mg Oral Daily  . phenytoin  100 mg Oral TID  . vancomycin  750 mg Intravenous Q12H    Recommendations: Stop antibiotics Consider colchicine, renally dosed or steroids Continue with [facility] Referral to nephrology and ortho at discharge  Thanks for the consult, Dr. Drue Second to follow up tomorrow  Assessment: Her course started with one foot and then the other foot swelling (?ankle), then right wrist last week then resolved with steroids and now with left wrist swelling.  Symptoms are very sensitive, swelling.  She also now has a fever.  She was previously on colchicine but this was stopped.  She has been developing renal failure since at least last year and was to get renal evaluation in [place] but did not go due to weather last winter.  She did have an aspiration in the ED with 1 cc of bloody fluid that clotted so significance of a negative crystal evaluation is low.  The clinical course of migrating monoarthritis, response to steroids, pain and sensitivity in the face of renal disease, hypertension, makes  gout most likely.  I do not suspect septic arthritis since it has affected multiple joints, GC possible but negative urine, no skin lesions and migratory, no reported recent sexual contacts; she has not been in a Lyme endemic area so serology not helpful or suggestive of disease if positive.   Antibiotics: Vancomycin and ceftriaxone  HPI: Michelle [name] is a 53 y.o. female with HIV on Kaletra and qod Truvada, CKD stage 3, DM2 who previously lived in [place] and [place].  She has recently moved to [facility] and was to establish care in [facility] but has been in the hospital x 2.  She initially came in earlier in Dec she reports due to foot and ankle swelling and pain.  She was also noted though to be altered and in hypertensive emergency requiring management.  She did develop some swelling in her right wrist and was treated at discharge with steroids.  She had been on colchicine but this was stopped.  It got better but she then developed pain and swelling of left hand and wrist and had exquisite sensitivity.  She also has developed some fever up to 101.7.  Her wrist is improving.  She did have an aspirate but was mainly blood and clotted.  No cell count was able to be done.  She has had renal failure that  has not yet been evaluated.  Also some pain and swelling in left elbow.     Review of Systems: A comprehensive review of systems was negative.  Past Medical History  Diagnosis Date  . Seizures   . Stroke   . Meningitis   . Diabetes mellitus without complication   . HIV (human immunodeficiency virus infection)   . Hypertension   . Gout   . Muscle spasms of head and/or neck     History  Substance Use Topics  . Smoking status: Former Smoker    Types: Cigarettes  . Smokeless tobacco: Never Used  . Alcohol Use: No    Family History  Problem Relation Age of Onset  . Cancer - Colon Mother   . Cancer Father   . Diabetes    . Hypertension Father    Allergies  Allergen Reactions  . Norvasc  [Amlodipine Besylate]     Itching, rash , hives .   [name] Morphine And Related Hives, Itching and Rash    OBJECTIVE: Blood pressure 143/72, pulse 103, temperature 101.7 F (38.7 C), temperature source Oral, resp. rate 18, height 5\' 7"  (1.702 m), weight 188 lb 15 oz (85.7 kg), SpO2 100.00%. General: awake, alert, nad Skin: no rashes Lungs: CTA B Cor: RRR with murmur Abdomen: soft, nt, nd Ext: left wrist with edema, some tenderness, improved per patient  Microbiology: Recent Results (from the past 240 hour(s))  BODY FLUID CULTURE     Status: None   Collection Time    05/28/13  1:38 PM      Result Value Range Status   Specimen Description SYNOVIAL FLUID LEFT WRIST   Final   Special Requests 0.3ML   Final   Gram Stain     Final   Value: RARE WBC PRESENT,BOTH PMN AND MONONUCLEAR     NO ORGANISMS SEEN     Performed at [facility]   Culture     Final   Value: NO GROWTH 1 DAY     Performed at [facility]   Report Status PENDING   Incomplete  GONOCOCCUS CULTURE     Status: None   Collection Time    05/28/13  1:38 PM      Result Value Range Status   Specimen Description FLUID SYNOVIAL LEFT WRIST   Final   Special Requests NONE   Final   Culture     Final   Value: NO GROWTH 1 DAY     Performed at [facility]   Report Status PENDING   Incomplete  CULTURE, BLOOD (ROUTINE X 2)     Status: None   Collection Time    05/28/13  4:10 PM      Result Value Range Status   Specimen Description BLOOD HAND RIGHT   Final   Special Requests BOTTLES DRAWN AEROBIC ONLY 5CC   Final   Culture  Setup Time     Final   Value: 05/28/2013 22:49     Performed at [facility]   Culture     Final   Value:        BLOOD CULTURE RECEIVED NO GROWTH TO DATE CULTURE WILL BE HELD FOR 5 DAYS BEFORE ISSUING A FINAL NEGATIVE REPORT     Performed at [facility]   Report Status PENDING   Incomplete  CULTURE, BLOOD (ROUTINE X 2)     Status: None   Collection Time     05/28/13  4:15 PM  Result Value Range Status   Specimen Description BLOOD HAND RIGHT   Final   Special Requests BOTTLES DRAWN AEROBIC ONLY 3CC   Final   Culture  Setup Time     Final   Value: 05/28/2013 22:48     [facility]   Culture     Final   Value:        BLOOD CULTURE RECEIVED NO GROWTH TO DATE CULTURE WILL BE HELD FOR 5 DAYS BEFORE ISSUING A FINAL NEGATIVE REPORT     [facility]   Report Status PENDING   Incomplete  GC/CHLAMYDIA PROBE AMP     Status: None   Collection Time    05/28/13  5:38 PM      Result Value Range Status   CT Probe RNA NEGATIVE  NEGATIVE Final   GC Probe RNA NEGATIVE  NEGATIVE Final   Comment: (NOTE)                                                                                               **Normal Reference Range: Negative**          Assay performed using the Gen-Probe APTIMA COMBO2 (R) Assay.     Acceptable specimen types for this assay include APTIMA Swabs (Unisex,     endocervical, urethral, or vaginal), first void urine, and ThinPrep     liquid based cytology samples.     Performed at [facility]  GONOCOCCUS CULTURE     Status: None   Collection Time    05/28/13  7:00 PM      Result Value Range Status   Specimen Description THROAT   Final   Special Requests NONE   Final   Culture     Final   Value: NO GROWTH 1 DAY     [facility]formed at [facility]   Report Status PENDING   Incomplete  CHLAMYDIA CULTURE     Status: None   Collection Time    05/28/13  7:00 PM      Result Value Range Status   Specimen Description THROAT   Final   Special Requests NONE   Final   Culture     Final   Value: Culture has been initiated.     Performed at [facility]   Report Status PENDING   Incomplete  MRSA PCR SCREENING     Status: None   Collection Time    05/28/13  7:08 PM      Result Value Range Status   MRSA by PCR NEGATIVE  NEGATIVE Final   Comment:            The GeneXpert MRSA  Assay (FDA     approved for NASAL specimens     only), is one component of a     comprehensive MRSA colonization     surveillance program. It is not     intended to diagnose MRSA     infection nor to guide or     monitor treatment for     MRSA infections.    Muad Noga, [email or web address],  MD [facility] [email or web address] [phone]  [phone] cell 05/30/2013, 1:48 PM

## 2013-05-30 NOTE — Progress Notes (Signed)
Subjective: Patient seen and examined at the bedside this morning. She is complaining of left-sided headache and pain under her left breast. The chest pain is sharp and non-radiating. She does not know if she's had it before. She does not appear to be in distress and is not diaphoretic.   Her left wrist pain has improved. She can move her left fingers, elbow, and shoulder when she could not do this before. Still has pain with direct palpation and passive ROM of the left wrist. Denies pain in other joints, though she told the night team she had pain in her right wrist and her left ankle.  Objective: Vital signs in last 24 hours: Filed Vitals:   05/29/13 1500 05/29/13 1539 05/29/13 2106 05/30/13 0553  BP: 119/67 150/88 132/75 143/72  Pulse:  104 105 103  Temp:  98.9 F (37.2 C) 101.7 F (38.7 C)   TempSrc:  Oral Oral   Resp: _0 Height:      Weight:    188 lb 15 oz (85.7 kg)  SpO2:  99% 100% 100%   Weight change: -9 lb 7.7 oz (-4.3 kg)  Intake/Output Summary (Last 24 hours) at 05/30/13 1009 Last data filed at 05/30/13 0600  Gross per 24 hour  Intake    740 ml  Output    990 ml  Net   -250 ml   Physical Exam  Constitutional: She is oriented to person, place, and time and well-developed, well-nourished, and in no distress.  HENT:  Head: Normocephalic and atraumatic.  Eyes: Conjunctivae and EOM are normal. Pupils are equal, round, and reactive to light.  Neck: Normal range of motion. Neck supple.  Cardiovascular: Normal rate, regular rhythm, normal heart sounds and intact distal pulses. Exam reveals no gallop and no friction rub.  2/6 systolic murmur heard best at LLSB with deep inspiration. Not heard on prior exam, but at that time she was not compliant with breath holding. Pulmonary/Chest: Effort normal and breath sounds normal.  Abdominal: Soft. She exhibits no distension. There is no tenderness.  Musculoskeletal:  Right wrist: Normal. She exhibits normal range of  motion, no tenderness and no swelling.  Left wrist: She exhibits decreased passive and active range of motion 2/2 pain (Left wrist), but improvement in ROM of left fingers, elbow, shoulder. Still with tenderness (of left wrist to direct palaption). Improvement in swelling.  Right ankle: Normal. She exhibits normal range of motion and no swelling. No tenderness.  Left ankle: Normal. She exhibits normal range of motion and no swelling. No tenderness.  Pulses intact.  Neurological: She is alert and oriented to person, place, and time. No cranial nerve deficit. GCS score is 15. Brudzinski and Kernig signs negative.  Skin: Skin is warm and dry. No rash noted.  No splinter hemorrhages, no Janeway lesions on hands or feet.   Psychiatric: Affect normal.  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    Lab Results: Basic Metabolic Panel:  Recent Labs Lab 05/28/13 1046 05/28/13 1930 05/29/13 0430  NA 140  --  139  K 4.2  --  3.8  CL 105  --  106  CO2 23  --  19  GLUCOSE 133*  --  97  BUN 22  --  22  CREATININE 1.61*  --  1.55*  CALCIUM 8.3*  --  7.8*  MG  --  1.4*  --    Liver Function Tests:  Recent Labs Lab 05/28/13 1930  AST 21  ALT 21  ALKPHOS 132*  BILITOT 1.3*  PROT 7.2  ALBUMIN 2.2*   No results found for this basename: LIPASE, AMYLASE,  in the last 168 hours No results found for this basename: AMMONIA,  in the last 168 hours CBC:  Recent Labs Lab 05/28/13 1046 05/29/13 0430  WBC 6.4 8.8  NEUTROABS 4.7  --   HGB 9.0* 8.5*  HCT 27.7* 25.8*  MCV 102.6* 102.4*  PLT 200 175   Cardiac Enzymes: No results found for this basename: CKTOTAL, CKMB, CKMBINDEX, TROPONINI,  in the last 168 hours BNP: No results found for this basename: PROBNP,  in the last 168 hours D-Dimer: No results found for this basename: DDIMER,  in the last 168 hours CBG:  Recent Labs Lab 05/28/13 1645 05/28/13 2025  05/28/13 2345 05/29/13 0409 05/29/13 0755  GLUCAP 89 137* 151* 109* 109*   Hemoglobin A1C: No results found for this basename: HGBA1C,  in the last 168 hours Fasting Lipid Panel: No results found for this basename: CHOL, HDL, LDLCALC, TRIG, CHOLHDL, LDLDIRECT,  in the last 168 hours Thyroid Function Tests: No results found for this basename: TSH, T4TOTAL, FREET4, T3FREE, THYROIDAB,  in the last 168 hours Coagulation: No results found for this basename: LABPROT, INR,  in the last 168 hours Anemia Panel: No results found for this basename: VITAMINB12, FOLATE, FERRITIN, TIBC, IRON, RETICCTPCT,  in the last 168 hours Urine Drug Screen: Drugs of Abuse     Component Value Date/Time   LABOPIA POSITIVE* 05/28/2013 1737   COCAINSCRNUR NONE DETECTED 05/28/2013 1737   LABBENZ NONE DETECTED 05/28/2013 1737   AMPHETMU NONE DETECTED 05/28/2013 1737   THCU NONE DETECTED 05/28/2013 1737   LABBARB NONE DETECTED 05/28/2013 1737    Alcohol Level: No results found for this basename: ETH,  in the last 168 hours Urinalysis: No results found for this basename: COLORURINE, APPERANCEUR, LABSPEC, PHURINE, GLUCOSEU, HGBUR, BILIRUBINUR, KETONESUR, PROTEINUR, UROBILINOGEN, NITRITE, LEUKOCYTESUR,  in the last 168 hours Misc. Labs:   Micro Results: Recent Results (from the past 240 hour(s))  BODY FLUID CULTURE     Status: None   Collection Time    05/28/13  1:38 PM      Result Value Range Status   Specimen Description SYNOVIAL FLUID LEFT WRIST   Final   Special Requests 0.3ML   Final   Gram Stain     Final   Value: RARE WBC PRESENT,BOTH PMN AND MONONUCLEAR     NO ORGANISMS SEEN     Performed at [facility]   Culture     Final   Value: NO GROWTH 1 DAY     Performed at [facility]   Report Status PENDING   Incomplete  GONOCOCCUS CULTURE     Status: None   Collection Time    05/28/13  1:38 PM      Result Value Range Status   Specimen Description FLUID SYNOVIAL LEFT WRIST   Final    Special Requests NONE   Final   Culture     Final   Value: NO GROWTH 1 DAY     Performed at [facility]   Report Status PENDING   Incomplete  CULTURE, BLOOD (ROUTINE X 2)     Status: None   Collection Time    05/28/13  4:10 PM      Result Value Range Status   Specimen Description BLOOD HAND RIGHT   Final  Special Requests BOTTLES DRAWN AEROBIC ONLY 5CC   Final   Culture  Setup Time     Final   Value: 05/28/2013 22:49     Performed at [facility]   Culture     Final   Value:        BLOOD CULTURE RECEIVED NO GROWTH TO DATE CULTURE WILL BE HELD FOR 5 DAYS BEFORE ISSUING A FINAL NEGATIVE REPORT     Performed at [facility]   Report Status PENDING   Incomplete  CULTURE, BLOOD (ROUTINE X 2)     Status: None   Collection Time    05/28/13  4:15 PM      Result Value Range Status   Specimen Description BLOOD HAND RIGHT   Final   Special Requests BOTTLES DRAWN AEROBIC ONLY 3CC   Final   Culture  Setup Time     Final   Value: 05/28/2013 22:48     Performed at [facility]   Culture     Final   Value:        BLOOD CULTURE RECEIVED NO GROWTH TO DATE CULTURE WILL BE HELD FOR 5 DAYS BEFORE ISSUING A FINAL NEGATIVE REPORT     Performed at [facility]   Report Status PENDING   Incomplete  GC/CHLAMYDIA PROBE AMP     Status: None   Collection Time    05/28/13  5:38 PM      Result Value Range Status   CT Probe RNA NEGATIVE  NEGATIVE Final   GC Probe RNA NEGATIVE  NEGATIVE Final   Comment: (NOTE)                                                                                               **Normal Reference Range: Negative**          Assay performed using the Gen-Probe APTIMA COMBO2 (R) Assay.     Acceptable specimen types for this assay include APTIMA Swabs (Unisex,     endocervical, urethral, or vaginal), first void urine, and ThinPrep     liquid based cytology samples.     Performed at [place]     Status: None     Collection Time    05/28/13  7:00 PM      Result Value Range Status   Specimen Description THROAT   Final   Special Requests NONE   Final   Culture     Final   Value: NO GROWTH 1 DAY     Performed at [facility]   Report Status PENDING   Incomplete  CHLAMYDIA CULTURE     Status: None   Collection Time    05/28/13  7:00 PM      Result Value Range Status   Specimen Description THROAT   Final   Special Requests NONE   Final   Culture     Final   Value: Culture has been initiated.     Performed at [facility]   Report Status PENDING   Incomplete  MRSA PCR SCREENING  Status: None   Collection Time    05/28/13  7:08 PM      Result Value Range Status   MRSA by PCR NEGATIVE  NEGATIVE Final   Comment:            The GeneXpert MRSA Assay (FDA     approved for NASAL specimens     only), is one component of a     comprehensive MRSA colonization     surveillance program. It is not     intended to diagnose MRSA     infection nor to guide or     monitor treatment for     MRSA infections.   Studies/Results: Dg Wrist Complete Left  05/28/2013   CLINICAL DATA:  Pain and swelling.  Limited range of motion.  EXAM: LEFT WRIST - COMPLETE 3+ VIEW  COMPARISON:  None.  FINDINGS: The bones appear somewhat osteopenic. There is no evidence of fracture, dislocation, degenerative change or other focal finding.  IMPRESSION: Question osteopenia.  Otherwise normal radiographs.   Electronically Signed   By: Nelson Chimes M.D.   On: 05/28/2013 10:33   Ct Head Wo Contrast  05/28/2013   CLINICAL DATA:  Back and arm pain  EXAM: CT HEAD WITHOUT CONTRAST  TECHNIQUE: Contiguous axial images were obtained from the base of the skull through the vertex without intravenous contrast.  COMPARISON:  02/14/2013  FINDINGS: Skull and Sinuses:No significant abnormality.  Orbits: No acute abnormality.  Brain: No evidence of acute abnormality, such as acute infarction, hemorrhage, hydrocephalus, or mass  lesion/mass effect. Generalized atrophy, age advanced. Stable pattern of chronic small vessel ischemic injury, with periventricular white matter low attenuation.  IMPRESSION: 1. No evidence of acute intracranial disease. 2. Generalized brain atrophy.   Electronically Signed   By: Jorje Guild M.D.   On: 05/28/2013 17:13   Medications: I have reviewed the patient's current medications. Scheduled Meds: . acyclovir  800 mg Oral Daily  . aspirin EC  81 mg Oral Daily  . cefTRIAXone (ROCEPHIN)  IV  2 g Intravenous Q24H  . cloNIDine  0.2 mg Oral BID  . emtricitabine-tenofovir  1 tablet Oral QODAY  . fentaNYL  25 mcg Intravenous Once  . furosemide  40 mg Oral Daily  . heparin  5,000 Units Subcutaneous Q8H  . hydrALAZINE  50 mg Oral TID  . labetalol  100 mg Oral BID  . levETIRAcetam  1,500 mg Oral Q12H  . lisinopril  40 mg Oral Daily  . lopinavir-ritonavir  2 tablet Oral BID  . LORazepam  1 mg Oral Daily  . nitroGLYCERIN      . pantoprazole  40 mg Oral Daily  . phenytoin  100 mg Oral TID  . vancomycin  750 mg Intravenous Q12H   Continuous Infusions:  PRN Meds:.acetaminophen, diphenhydrAMINE, hydrALAZINE, HYDROcodone-acetaminophen, nitroGLYCERIN Assessment/Plan: Michelle [name] is a 53 y.o. woman PMH of HTN with recent admission for HTN emergency, CKD stage 3 (baseline Cr ~1.7), well controlled DM2 (last A1C 5.0 on 05/14/13), well controlled HIV (last CD4 490, VL <20 on 05/07/13), HCV, seizure disorder, migraines, DM2, herpes simplex, gout, who presents to the ED with left wrist pain who later developed altered mental status.   #Hypertensive emergency with encephalopathy, resolved - Patient was transferred to the ICU on 05/28/13 for nicardipine drip. BP was controlled and drip was discontinued on 12/31. She has history of hypertension emergency causing encephalopathy secondary to medication noncompliance. Her mental status is improved today. - Continue home Lasix 40  mg daily, hydralazine 50 mg 3  times a day, labetalol 100 mg twice a day, lisinopril 40 mg daily - Clonidine will not be the best choice for this patient going forward due to rebound hypertension affects, will taper off home clonidine 0.2 mg twice a day and cover with alternate regimen - Cardiac monitoring  - Daily weights  - Ins and outs  - Low sodium diet  - Aspirin 81 mg daily   #Left wrist arthralgia with fever - Patient presented with left wrist arthralgia and swelling. Patient has a history of gout and was recently taken off her colchicine due to chronic renal failure. However, synovial fluid analysis was negative for crystals. IV vancomycin and ceftriaxone were started to cover septic arthritis. Since ICU transfer, she has been spiking fevers (most recent 101.7 last night) on top of these antibiotics. She has a past history of gonorrhea and chlamydia but denies recent sexual contact. Urine PCR probe for GC/chlamydia was negative. Both gonococcus and regular culture of synovial fluid is NGTD. Synovial fluid cell count with differential could not be performed due to clotted specimen. B burdorferi Ab positive at 2.64. Ordered western blot for confirmation. She has received 2 days of IV ceftriaxone which would cover for lyme disease with neurologic complications. ANA titer 1:160. ESR is elevated at 132. RF negative. On physical exam today a 2/6 systolic murmur was noted at the LLSB. No other stigmata of endocarditis. Today she complains of a headache, but there are no signs of meningismus. Her wrist pain has improved on antibiotics. - Infectious disease consult, Dr. Linus Salmons has been called - Follow up synovial fluid culture > NGTD - Follow up rectal and throat swab for GC/chlamydia > In process - Follow up blood cultures 12/30 > NGTD - Follow up blood cultures 12/31 > In process - Repeat blood culture x1 today given fever overnight - Follow up B burdorferi Ab western blot for confirmation - Checking flu panel - Obtain UA with  urine culture - Continue IV vancomycin and ceftriaxone per pharmacy - 2D echocardiogram to rule out endocarditis - Pain control with home Norco 10-325 every 6 hours when necessary  - CBC, CMP in am   #Chest pain - This morning patient reported sharp, nonradiating chest pain under her left breast. Pain is not positional.  EKG reviewed and unchanged from prior, no concerning ST/T wave changes. She refused nitroglycerin because she feared it would worsen her headache. She refused morphine because it has caused itching in the past.  - Cycling troponins - Fentanyl 63mg IV x1 - Continue to monitor  #Seizure disorder - Home medications include Keppra 1500 mg twice a day, phenytoin 100 mg 3 times a day, Ativan 1 mg daily. Dilantin level here was <2.5, suggesting noncompliance. - Continue home medications - Follow up Keppra level > pending  #Hepatitis C genotype 1b - Poorly controlled. Very high HCV quant on 05/21/13. LFTs show elevated alkaline phosphatase (stable from prior) and elevated direct bilirubin (1.0). - Repeat CMP in am  Hepatic Function Panel     Component Value Date/Time   PROT 7.2 05/28/2013 1930   ALBUMIN 2.2* 05/28/2013 1930   AST 21 05/28/2013 1930   ALT 21 05/28/2013 1930   ALKPHOS 132* 05/28/2013 1930   BILITOT 1.3* 05/28/2013 1930   BILIDIR 1.0* 05/28/2013 1930   IBILI 0.3 05/28/2013 1930    #History of herpes simplex - Patient denies active lesions.  - Continue acyclovir 800 mg daily.   #CKD stage  3 - Creatinine currently 1.6, which is her baseline.  - Continue to monitor   #HIV - Last CD4 490, VL <20 on 05/07/13. She established care with Dr. Baxter Flattery on 05/21/13. 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  - Continue Kaletra 200-50 mg 2 tablets twice a day, Truvada 200-300 mg one tablet daily   #History of syphilis - Labs from 05/07/13 show RPR reactive, titer 1:1, T pallidum antibodies >8.00.  - Per Dr. Storm Frisk note, she was treated many years ago and there  is no current need to treat.   #Diabetes - Well-controlled, most recent A1c was 5.0%.   #GERD - Protonix 40 mg daily.   #DVT PPX - Heparin subcutaneous.   Dispo: Disposition is deferred at this time, awaiting improvement of current medical problems.  Anticipated discharge in approximately 1-3 day(s).   The patient does have a current PCP Clinton Gallant, MD) and does need an [facility] hospital follow-up appointment after discharge.  The patient does not have transportation limitations that hinder transportation to clinic appointments.  .Services Needed at time of discharge: Y = Yes, Blank = No PT:   OT:   RN:   Equipment:   Other:     LOS: 2 days   Lesly Dukes, MD 05/30/2013, 10:09 AM

## 2013-05-31 DIAGNOSIS — K219 Gastro-esophageal reflux disease without esophagitis: Secondary | ICD-10-CM

## 2013-05-31 DIAGNOSIS — R4182 Altered mental status, unspecified: Secondary | ICD-10-CM

## 2013-05-31 DIAGNOSIS — M25539 Pain in unspecified wrist: Secondary | ICD-10-CM

## 2013-05-31 DIAGNOSIS — B1920 Unspecified viral hepatitis C without hepatic coma: Secondary | ICD-10-CM

## 2013-05-31 DIAGNOSIS — I129 Hypertensive chronic kidney disease with stage 1 through stage 4 chronic kidney disease, or unspecified chronic kidney disease: Secondary | ICD-10-CM

## 2013-05-31 DIAGNOSIS — G40909 Epilepsy, unspecified, not intractable, without status epilepticus: Secondary | ICD-10-CM

## 2013-05-31 DIAGNOSIS — I059 Rheumatic mitral valve disease, unspecified: Secondary | ICD-10-CM

## 2013-05-31 DIAGNOSIS — Z21 Asymptomatic human immunodeficiency virus [HIV] infection status: Secondary | ICD-10-CM

## 2013-05-31 DIAGNOSIS — N189 Chronic kidney disease, unspecified: Secondary | ICD-10-CM

## 2013-05-31 DIAGNOSIS — M109 Gout, unspecified: Secondary | ICD-10-CM | POA: Diagnosis present

## 2013-05-31 DIAGNOSIS — B009 Herpesviral infection, unspecified: Secondary | ICD-10-CM

## 2013-05-31 DIAGNOSIS — D638 Anemia in other chronic diseases classified elsewhere: Secondary | ICD-10-CM | POA: Diagnosis present

## 2013-05-31 DIAGNOSIS — E119 Type 2 diabetes mellitus without complications: Secondary | ICD-10-CM

## 2013-05-31 DIAGNOSIS — D539 Nutritional anemia, unspecified: Secondary | ICD-10-CM

## 2013-05-31 LAB — URINE CULTURE
CULTURE: NO GROWTH
Colony Count: NO GROWTH

## 2013-05-31 LAB — COMPREHENSIVE METABOLIC PANEL
ALBUMIN: 1.8 g/dL — AB (ref 3.5–5.2)
ALK PHOS: 100 U/L (ref 39–117)
ALT: 18 U/L (ref 0–35)
AST: 17 U/L (ref 0–37)
BILIRUBIN TOTAL: 1.3 mg/dL — AB (ref 0.3–1.2)
BUN: 35 mg/dL — ABNORMAL HIGH (ref 6–23)
CHLORIDE: 102 meq/L (ref 96–112)
CO2: 17 meq/L — AB (ref 19–32)
CREATININE: 2.25 mg/dL — AB (ref 0.50–1.10)
Calcium: 7.2 mg/dL — ABNORMAL LOW (ref 8.4–10.5)
GFR calc Af Amer: 27 mL/min — ABNORMAL LOW (ref >90)
GFR, EST NON AFRICAN AMERICAN: 24 mL/min — AB (ref >90)
Glucose, Bld: 142 mg/dL — ABNORMAL HIGH (ref 70–99)
POTASSIUM: 4.1 meq/L (ref 3.7–5.3)
Sodium: 135 mEq/L — ABNORMAL LOW (ref 137–147)
Total Protein: 6.6 g/dL (ref 6.0–8.3)

## 2013-05-31 LAB — TECHNOLOGIST SMEAR REVIEW

## 2013-05-31 LAB — DIRECT ANTIGLOBULIN TEST (NOT AT ARMC)
DAT, IgG: POSITIVE
DAT, complement: POSITIVE

## 2013-05-31 LAB — RETICULOCYTES
RBC.: 2.07 MIL/uL — AB (ref 3.87–5.11)
RETIC COUNT ABSOLUTE: 39.3 10*3/uL (ref 19.0–186.0)
RETIC CT PCT: 1.9 % (ref 0.4–3.1)

## 2013-05-31 LAB — CBC
HCT: 20.9 % — ABNORMAL LOW (ref 36.0–46.0)
HEMATOCRIT: 19.3 % — AB (ref 36.0–46.0)
HEMOGLOBIN: 6.7 g/dL — AB (ref 12.0–15.0)
Hemoglobin: 6.9 g/dL — CL (ref 12.0–15.0)
MCH: 34.5 pg — ABNORMAL HIGH (ref 26.0–34.0)
MCH: 33.3 pg (ref 26.0–34.0)
MCHC: 34.7 g/dL (ref 30.0–36.0)
MCHC: 33 g/dL (ref 30.0–36.0)
MCV: 99.5 fL (ref 78.0–100.0)
MCV: 101 fL — ABNORMAL HIGH (ref 78.0–100.0)
Platelets: 181 10*3/uL (ref 150–400)
Platelets: 198 10*3/uL (ref 150–400)
RBC: 1.94 MIL/uL — AB (ref 3.87–5.11)
RBC: 2.07 MIL/uL — AB (ref 3.87–5.11)
RDW: 13.5 % (ref 11.5–15.5)
RDW: 13.3 % (ref 11.5–15.5)
WBC: 6.7 10*3/uL (ref 4.0–10.5)
WBC: 7.2 10*3/uL (ref 4.0–10.5)

## 2013-05-31 LAB — GONOCOCCUS CULTURE
Culture: NO GROWTH
Culture: NO GROWTH

## 2013-05-31 LAB — PREPARE RBC (CROSSMATCH)

## 2013-05-31 LAB — LACTATE DEHYDROGENASE: LDH: 309 U/L — ABNORMAL HIGH (ref 94–250)

## 2013-05-31 LAB — HAPTOGLOBIN: Haptoglobin: 94 mg/dL (ref 45–215)

## 2013-05-31 MED ORDER — PREDNISONE 50 MG PO TABS
70.0000 mg | ORAL_TABLET | Freq: Every day | ORAL | Status: DC
  Administered 2013-05-31 – 2013-06-02 (×3): 70 mg via ORAL
  Filled 2013-05-31 (×4): qty 1

## 2013-05-31 MED ORDER — ABACAVIR SULFATE 300 MG PO TABS
600.0000 mg | ORAL_TABLET | Freq: Every day | ORAL | Status: DC
  Administered 2013-05-31 – 2013-06-01 (×2): 600 mg via ORAL
  Filled 2013-05-31 (×2): qty 2

## 2013-05-31 MED ORDER — ABACAVIR SULFATE-LAMIVUDINE 600-300 MG PO TABS
1.0000 | ORAL_TABLET | Freq: Every day | ORAL | Status: DC
  Filled 2013-05-31: qty 1

## 2013-05-31 MED ORDER — LAMIVUDINE 150 MG PO TABS
300.0000 mg | ORAL_TABLET | Freq: Every day | ORAL | Status: DC
  Administered 2013-05-31 – 2013-06-01 (×2): 300 mg via ORAL
  Filled 2013-05-31 (×2): qty 2

## 2013-05-31 MED ORDER — PREDNISONE 20 MG PO TABS
20.0000 mg | ORAL_TABLET | Freq: Every day | ORAL | Status: DC
  Filled 2013-05-31: qty 1

## 2013-05-31 NOTE — Progress Notes (Signed)
Patient's Coombs test was positive for complement and IgG antibodies and there is evidence of red cell destruction on her hemolysis labs (elevated LDH; haptoglobin and smear pending). 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 Review of medications shows Ceftriaxone is a known culprit. Treatment is prednisone 1mg /kg daily, which for her is ~70mg  daily. I have started this. We will follow up a post-transfusion CBC and continue to transfuse for a Hgb goal >7 as indicated.  Vivi Barrack, MD  [email or web address] Pager # [phone] Office # [phone]

## 2013-05-31 NOTE — Progress Notes (Signed)
CRITICAL VALUE ALERT  Critical value received:  Hemoglobin 6.7   Date of notification:  05/31/13  Time of notification:  0730  Critical value read back: yes  Nurse who received alert:  lexie  MD notified (1st page):  Yes   Time of first page:  0745  MD notified (2nd page): none  Time of second page: none  Responding MD:  cater  Time MD responded:  [phone]

## 2013-05-31 NOTE — Progress Notes (Signed)
  Echocardiogram 2D Echocardiogram has been performed.  Michelle [name] 05/31/2013, 12:13 PM

## 2013-05-31 NOTE — Progress Notes (Signed)
PT Cancellation Note  Patient Details Name: Michelle [name] MRN: [id number] DOB: 1961/01/20   Cancelled Treatment:    Reason Eval/Treat Not Completed: Medical issues which prohibited therapy (HgB this am 6.7 and 6.9)   Fabio Asa 05/31/2013, 10:54 AM Charlotte Crumb, PT DPT  [phone]

## 2013-05-31 NOTE — Progress Notes (Signed)
Subjective: Patient seen and examined at the bedside this morning. Michelle [name] cousin was in the room visiting. Michelle [name] headache, chest pain, and wrist pain are all getting better. She feels well and denies shortness of breath, nausea, vomiting. She thinks Michelle [name] left wrist pain may be "jumping" to Michelle [name] right wrist and ankle. Denies bloody bowel movements, dark or tarry stool, hemoptysis, hematemesis.   Objective: Vital signs in last 24 hours: Filed Vitals:   05/30/13 0553 05/30/13 1345 05/30/13 2037 05/31/13 0558  BP: 143/72 135/77 134/81 124/74  Pulse: 103 101 101 98  Temp:  100.1 F (37.8 C) 100.6 F (38.1 C) 99.9 F (37.7 C)  TempSrc:  Oral Oral Oral  Resp: 18 18 18 18   Height:      Weight: 188 lb 15 oz (85.7 kg)   150 lb 12.7 oz (68.4 kg)  SpO2: 100% 99% 99% 98%   Weight change: -38 lb 2.2 oz (-17.3 kg)  Intake/Output Summary (Last 24 hours) at 05/31/13 1026 Last data filed at 05/31/13 0851  Gross per 24 hour  Intake    480 ml  Output   2075 ml  Net  -1595 ml   Physical Exam  Constitutional: She is oriented to person, place, and time and well-developed, well-nourished, and in no distress.  HENT:  Head: Normocephalic and atraumatic.  Eyes: Conjunctivae and EOM are normal. Pupils are equal, round, and reactive to light.  Neck: Normal range of motion. Neck supple.  Cardiovascular: Normal rate, regular rhythm, normal heart sounds and intact distal pulses. Exam reveals no gallop and no friction rub.  2/6 systolic murmur heard best at LLSB with deep inspiration.  Pulmonary/Chest: Effort normal and breath sounds normal.  Abdominal: Soft. She exhibits no distension. There is no tenderness.  Musculoskeletal:  Right wrist: Normal. She exhibits normal range of motion, no tenderness and no swelling.  Left wrist: She exhibits improvement in ROM of left fingers, elbow, shoulder. Still with tenderness (of left wrist to direct palaption). Improvement in swelling.  Right ankle: She exhibits normal  range of motion and no swelling. Some mild tenderness to direct palpation of ankle. No tenderness to palpation of toes. Left ankle: Normal. She exhibits normal range of motion and no swelling. No tenderness.  Pulses intact.  Neurological: She is alert and oriented to person, place, and time. No cranial nerve deficit. GCS score is 15. Brudzinski and Kernig signs negative.  Skin: Skin is warm and dry. No rash noted.  No splinter hemorrhages, no Janeway lesions on hands or feet.   Psychiatric: Affect normal.  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    Lab Results: Basic Metabolic Panel:  Recent Labs Lab 05/28/13 1930 05/29/13 0430 05/31/13 0630  NA  --  139 135*  K  --  3.8 4.1  CL  --  106 102  CO2  --  19 17*  GLUCOSE  --  97 142*  BUN  --  22 35*  CREATININE  --  1.55* 2.25*  CALCIUM  --  7.8* 7.2*  MG 1.4*  --   --    Liver Function Tests:  Recent Labs Lab 05/28/13 1930 05/31/13 0630  AST 21 17  ALT 21 18  ALKPHOS 132* 100  BILITOT 1.3* 1.3*  PROT 7.2 6.6  ALBUMIN 2.2* 1.8*   No results found for this basename: LIPASE, AMYLASE,  in the last 168 hours No results found for this basename: AMMONIA,  in  the last 168 hours CBC:  Recent Labs Lab 05/28/13 1046 05/29/13 0430 05/31/13 0630  WBC 6.4 8.8 6.7  NEUTROABS 4.7  --   --   HGB 9.0* 8.5* 6.7*  HCT 27.7* 25.8* 19.3*  MCV 102.6* 102.4* 99.5  PLT 200 175 181   Cardiac Enzymes:  Recent Labs Lab 05/30/13 1005 05/30/13 1410 05/30/13 2055  TROPONINI <0.30 <0.30 <0.30   BNP: No results found for this basename: PROBNP,  in the last 168 hours D-Dimer: No results found for this basename: DDIMER,  in the last 168 hours CBG:  Recent Labs Lab 05/28/13 1645 05/28/13 2025 05/28/13 2345 05/29/13 0409 05/29/13 0755  GLUCAP 89 137* 151* 109* 109*   Hemoglobin A1C: No results found for this basename: HGBA1C,  in the last 168  hours Fasting Lipid Panel: No results found for this basename: CHOL, HDL, LDLCALC, TRIG, CHOLHDL, LDLDIRECT,  in the last 168 hours Thyroid Function Tests: No results found for this basename: TSH, T4TOTAL, FREET4, T3FREE, THYROIDAB,  in the last 168 hours Coagulation: No results found for this basename: LABPROT, INR,  in the last 168 hours Anemia Panel: No results found for this basename: VITAMINB12, FOLATE, FERRITIN, TIBC, IRON, RETICCTPCT,  in the last 168 hours Urine Drug Screen: Drugs of Abuse     Component Value Date/Time   LABOPIA POSITIVE* 05/28/2013 1737   COCAINSCRNUR NONE DETECTED 05/28/2013 1737   LABBENZ NONE DETECTED 05/28/2013 1737   AMPHETMU NONE DETECTED 05/28/2013 1737   THCU NONE DETECTED 05/28/2013 1737   LABBARB NONE DETECTED 05/28/2013 1737    Alcohol Level: No results found for this basename: ETH,  in the last 168 hours Urinalysis:  Recent Labs Lab 05/30/13 1349  COLORURINE YELLOW  LABSPEC 1.020  PHURINE 6.0  GLUCOSEU NEGATIVE  HGBUR TRACE*  BILIRUBINUR NEGATIVE  KETONESUR NEGATIVE  PROTEINUR 100*  UROBILINOGEN 0.2  NITRITE NEGATIVE  LEUKOCYTESUR TRACE*   Misc. Labs:   Micro Results: Recent Results (from the past 240 hour(s))  BODY FLUID CULTURE     Status: None   Collection Time    05/28/13  1:38 PM      Result Value Range Status   Specimen Description SYNOVIAL FLUID LEFT WRIST   Final   Special Requests 0.3ML   Final   Gram Stain     Final   Value: RARE WBC PRESENT,BOTH PMN AND MONONUCLEAR     NO ORGANISMS SEEN     Performed at [facility]   Culture     Final   Value: NO GROWTH 2 DAYS     Performed at [facility]   Report Status PENDING   Incomplete  GONOCOCCUS CULTURE     Status: None   Collection Time    05/28/13  1:38 PM      Result Value Range Status   Specimen Description FLUID SYNOVIAL LEFT WRIST   Final   Special Requests NONE   Final   Culture     Final   Value: NO GROWTH 1 DAY     Performed at [facility]   Report Status PENDING   Incomplete  CULTURE, BLOOD (ROUTINE X 2)     Status: None   Collection Time    05/28/13  4:10 PM      Result Value Range Status   Specimen Description BLOOD HAND RIGHT   Final   Special Requests BOTTLES DRAWN AEROBIC ONLY 5CC   Final   Culture  Setup Time  Final   Value: 05/28/2013 22:49     Performed at [facility]   Culture     Final   Value:        BLOOD CULTURE RECEIVED NO GROWTH TO DATE CULTURE WILL BE HELD FOR 5 DAYS BEFORE ISSUING A FINAL NEGATIVE REPORT     Performed at [facility]   Report Status PENDING   Incomplete  CULTURE, BLOOD (ROUTINE X 2)     Status: None   Collection Time    05/28/13  4:15 PM      Result Value Range Status   Specimen Description BLOOD HAND RIGHT   Final   Special Requests BOTTLES DRAWN AEROBIC ONLY 3CC   Final   Culture  Setup Time     Final   Value: 05/28/2013 22:48     Performed at [facility]   Culture     Final   Value:        BLOOD CULTURE RECEIVED NO GROWTH TO DATE CULTURE WILL BE HELD FOR 5 DAYS BEFORE ISSUING A FINAL NEGATIVE REPORT     Performed at [facility]   Report Status PENDING   Incomplete  GC/CHLAMYDIA PROBE AMP     Status: None   Collection Time    05/28/13  5:38 PM      Result Value Range Status   CT Probe RNA NEGATIVE  NEGATIVE Final   GC Probe RNA NEGATIVE  NEGATIVE Final   Comment: (NOTE)                                                                                               **Normal Reference Range: Negative**          Assay performed using the Gen-Probe APTIMA COMBO2 (R) Assay.     Acceptable specimen types for this assay include APTIMA Swabs (Unisex,     endocervical, urethral, or vaginal), first void urine, and ThinPrep     liquid based cytology samples.     Performed at [facility]  GONOCOCCUS CULTURE     Status: None   Collection Time    05/28/13  7:00 PM      Result Value Range Status   Specimen Description THROAT    Final   Special Requests NONE   Final   Culture     Final   Value: NO GROWTH 2 DAYS     Performed at [facility]   Report Status 05/31/2013 FINAL   Final  CHLAMYDIA CULTURE     Status: None   Collection Time    05/28/13  7:00 PM      Result Value Range Status   Specimen Description THROAT   Final   Special Requests NONE   Final   Culture     Final   Value: No Chlamydia identified in cell culture     Performed at [facility]   Report Status 05/30/2013 FINAL   Final  MRSA PCR SCREENING     Status: None   Collection Time    05/28/13  7:08 PM  Result Value Range Status   MRSA by PCR NEGATIVE  NEGATIVE Final   Comment:            The GeneXpert MRSA Assay (FDA     approved for NASAL specimens     only), is one component of a     comprehensive MRSA colonization     surveillance program. It is not     intended to diagnose MRSA     infection nor to guide or     monitor treatment for     MRSA infections.  CULTURE, BLOOD (ROUTINE X 2)     Status: None   Collection Time    05/29/13 10:20 PM      Result Value Range Status   Specimen Description BLOOD RIGHT HAND   Final   Special Requests BOTTLES DRAWN AEROBIC ONLY 2CC   Final   Culture  Setup Time     Final   Value: 05/30/2013 04:20     Performed at [facility]   Culture     Final   Value:        BLOOD CULTURE RECEIVED NO GROWTH TO DATE CULTURE WILL BE HELD FOR 5 DAYS BEFORE ISSUING A FINAL NEGATIVE REPORT     Performed at [facility]   Report Status PENDING   Incomplete  CULTURE, BLOOD (ROUTINE X 2)     Status: None   Collection Time    05/29/13 10:30 PM      Result Value Range Status   Specimen Description BLOOD RIGHT ARM   Final   Special Requests BOTTLES DRAWN AEROBIC ONLY 3CC   Final   Culture  Setup Time     Final   Value: 05/30/2013 04:19     Performed at [facility]   Culture     Final   Value:        BLOOD CULTURE RECEIVED NO GROWTH TO DATE CULTURE WILL BE HELD FOR 5  DAYS BEFORE ISSUING A FINAL NEGATIVE REPORT     Performed at [facility]   Report Status PENDING   Incomplete  CULTURE, BLOOD (SINGLE)     Status: None   Collection Time    05/30/13 10:50 AM      Result Value Range Status   Specimen Description BLOOD LEFT HAND   Final   Special Requests BOTTLES DRAWN AEROBIC ONLY 3CC   Final   Culture  Setup Time     Final   Value: 05/30/2013 14:35     Performed at [facility]   Culture     Final   Value:        BLOOD CULTURE RECEIVED NO GROWTH TO DATE CULTURE WILL BE HELD FOR 5 DAYS BEFORE ISSUING A FINAL NEGATIVE REPORT     Performed at [facility]   Report Status PENDING   Incomplete   Studies/Results: No results found. Medications: I have reviewed the patient's current medications. Scheduled Meds: . acyclovir  800 mg Oral Daily  . aspirin EC  81 mg Oral Daily  . cloNIDine  0.2 mg Oral BID  . colchicine  0.6 mg Oral Daily  . emtricitabine-tenofovir  1 tablet Oral QODAY  . furosemide  40 mg Oral Daily  . heparin  5,000 Units Subcutaneous Q8H  . hydrALAZINE  50 mg Oral TID  . labetalol  100 mg Oral BID  . levETIRAcetam  1,500 mg Oral Q12H  . lisinopril  40 mg Oral Daily  .  lopinavir-ritonavir  2 tablet Oral BID  . LORazepam  1 mg Oral Daily  . pantoprazole  40 mg Oral Daily  . phenytoin  100 mg Oral TID   Continuous Infusions:  PRN Meds:.acetaminophen, diphenhydrAMINE, hydrALAZINE, HYDROcodone-acetaminophen, nitroGLYCERIN Assessment/Plan: Michelle [name] is a 53 y.o. woman PMH of HTN with recent admission for HTN emergency, CKD stage 3 (baseline Cr ~1.7), well controlled DM2 (last A1C 5.0 on 05/14/13), well controlled HIV (last CD4 490, VL <20 on 05/07/13), HCV, seizure disorder, migraines, DM2, herpes simplex, gout, who presents to the ED with left wrist pain who later developed altered mental status.   #Hypertensive emergency with encephalopathy, resolved - Patient was transferred to the ICU on 05/28/13 for  nicardipine drip. BP was controlled and drip was discontinued on 12/31. She has history of hypertension emergency causing encephalopathy secondary to medication noncompliance. BP now well controlled on home medications and mental status is baseline. - Continue home Lasix 40 mg daily, hydralazine 50 mg 3 times a day, labetalol 100 mg twice a day, lisinopril 40 mg daily, clonidine 0.2 mg twice a day - Cardiac monitoring  - Daily weights  - Ins and outs  - Low sodium diet  - Aspirin 81 mg daily   #Left wrist arthralgia with fever - Patient presented with left wrist arthralgia and swelling. Patient has a history of gout and was recently taken off Michelle [name] colchicine due to chronic renal failure. Synovial fluid analysis here was negative for crystals, but per ID the fact that the specimen clotted makes this analysis less useful. ID feels Michelle [name] clinical course of migrating monoarthritis, response to steroids, exquisite pain, and history of renal disease and hypertension make gout most likely. We have therefore added back Michelle [name] colchicine. ID does not suspect septic arthritis since this has affected multiple joints. We have discontinued antibiotics. Culture of synovial fluid is NGTD. Fevers have resolved since stopping Vanc/Ceftriaxone, but we will continue to monitor. Urine PCR probe for GC/chlamydia was negative, and she has no skin lesions and no reported recent sexual contacts. Gonococcus culture of throat and synovial fluid is NGTD. She has not been in a Lyme endemic area, so Michelle [name] positive B burdorferi Ab is not helpful/ suggestive of disease per ID. Flu panel was negative. UA with trace leukocytes and few bacteria, she is asymptomatic. - Appreciate ID recs, Dr. Drue SecondSnider to follow up today - Continue colchicine 0.6mg  daily - Follow up synovial fluid culture > NGTD - Follow up rectal and throat swab for GC/chlamydia > NGTD - Follow up blood cultures 12/30, 12/31, 1/1 > NGTD - Follow up urine culture 1/1 > In  process - 2D echocardiogram to rule out endocarditis - Pain control with home Norco 10-325 every 4 hours when necessary  - CBC, CMP in am   #Acute on chronic macrocytic anemia - Today Michelle [name] Hgb dropped from 8.5 to 6.7, repeat was 6.9. Baseline hemoglobin 8.5-9.0. Baseline MCV 100-102. Iron panel from 12/16 showed iron 40 (slightly low), TIBC 251 (wnl), Sat ratio 16 (low). B12 488 (wnl) and folate 10.4 (wnl). Chronic etiologies include HIV meds, HCV, CKD. I am concerned about hemolysis in this patient as she has a bilirubinemia (1.3) and no active bleeding. Potential acute causes include drug reaction, infection, HCV. Patient is currently asymptomatic from Michelle [name] anemia. - Checking LDH, haptoglobin, reticulocytes, direct [place] - Follow up technical smear - Type and screen - Transfuse 1U PRBC - Follow up post-transfusion CBC   Hemoglobin  Date Value Range Status  05/31/2013  6.9* 12.0 - 15.0 g/dL Final     CRITICAL VALUE NOTED.  VALUE IS CONSISTENT WITH PREVIOUSLY REPORTED AND CALLED VALUE.  05/31/2013 6.7* 12.0 - 15.0 g/dL Final     REPEATED TO VERIFY     CRITICAL RESULT CALLED TO, READ BACK BY AND VERIFIED WITH:     MAKRIS,L RN [id number] 05/31/13 LEONARD,A  05/29/2013 8.5* 12.0 - 15.0 g/dL Final  26/94/8546 9.0* 12.0 - 15.0 g/dL Final  27/07/5007 9.5* 12.0 - 15.0 g/dL Final    #Chest pain, resolved - Improved with Fentanyl IV x1 and Michelle [name] home Norco. Cycled troponins and negative x3. No EKG changes. Likely musculoskeletal vs. Anxiety. - Continue to monitor  #Seizure disorder - Home medications include Keppra 1500 mg twice a day, phenytoin 100 mg 3 times a day, Ativan 1 mg daily. Dilantin level here was <2.5, suggesting noncompliance. - Continue home medications - Follow up Keppra level > pending  #Hepatitis C genotype 1b - Poorly controlled. Very high HCV quant on 05/21/13. LFTs show elevated alkaline phosphatase (stable from prior) and elevated direct bilirubin (1.0). - Daily CMP  Hepatic  Function Panel     Component Value Date/Time   PROT 6.6 05/31/2013 0630   ALBUMIN 1.8* 05/31/2013 0630   AST 17 05/31/2013 0630   ALT 18 05/31/2013 0630   ALKPHOS 100 05/31/2013 0630   BILITOT 1.3* 05/31/2013 0630   BILIDIR 1.0* 05/28/2013 1930   IBILI 0.3 05/28/2013 1930    #History of herpes simplex - Patient denies active lesions.  - Continue acyclovir 800 mg daily  #CKD stage 3 - Creatinine currently 1.6, which is Michelle [name] baseline.  - Continue to monitor   #HIV - Last CD4 490, VL <20 on 05/07/13. She established care with Dr. Drue Second on 05/21/13. She has some cognitive impairment from Michelle [name] HIV.  - Continue Kaletra 200-50 mg 2 tablets twice a day, Truvada 200-300 mg one tablet daily   #History of syphilis - Labs from 05/07/13 show RPR reactive, titer 1:1, T pallidum antibodies >8.00.  - Per Dr. Feliz Beam note, she was treated many years ago and there is no current need to treat.   #Diabetes - Well-controlled, most recent A1c was 5.0%.   #GERD - Protonix 40 mg daily.   #DVT PPX - Heparin subcutaneous.   Dispo: Disposition is deferred at this time, awaiting improvement of current medical problems.  Anticipated discharge in approximately 1-3 day(s).   The patient does have a current PCP Christen Bame, MD) and does need an [facility] hospital follow-up appointment after discharge.  The patient does not have transportation limitations that hinder transportation to clinic appointments.  .Services Needed at time of discharge: Y = Yes, Blank = No PT:   OT:   RN:   Equipment:   Other:     LOS: 3 days   Vivi Barrack, MD 05/31/2013, 10:26 AM

## 2013-05-31 NOTE — Progress Notes (Signed)
[facility]    Date of Admission:  05/28/2013   Total days of antibiotics 4        Day 3 acyclovir           ID: Michelle [name] is a 53 y.o. female with  HIV, CKD, GFR 25-30, poorly controlled HTN, with hypertensive emergency  Principal Problem:   Left Wrist arthralgia Active Problems:   Unspecified essential hypertension   HIV disease   CKD (chronic kidney disease) stage 3, GFR 30-59 ml/min   Diabetes   Hypertensive emergency   Hypertensive urgency   Encephalopathy, hypertensive    Subjective: Improved left wrist pain, afebrile, no headaches  Medications:  . acyclovir  800 mg Oral Daily  . aspirin EC  81 mg Oral Daily  . cloNIDine  0.2 mg Oral BID  . colchicine  0.6 mg Oral Daily  . emtricitabine-tenofovir  1 tablet Oral QODAY  . furosemide  40 mg Oral Daily  . heparin  5,000 Units Subcutaneous Q8H  . hydrALAZINE  50 mg Oral TID  . labetalol  100 mg Oral BID  . levETIRAcetam  1,500 mg Oral Q12H  . lisinopril  40 mg Oral Daily  . lopinavir-ritonavir  2 tablet Oral BID  . LORazepam  1 mg Oral Daily  . pantoprazole  40 mg Oral Daily  . phenytoin  100 mg Oral TID    Objective: Vital signs in last 24 hours: Temp:  [99.9 F (37.7 C)-100.6 F (38.1 C)] 99.9 F (37.7 C) (01/02 0558) Pulse Rate:  [98-101] 98 (01/02 0558) Resp:  [18] 18 (01/02 0558) BP: (124-135)/(74-81) 124/74 mmHg (01/02 0558) SpO2:  [98 %-99 %] 98 % (01/02 0558) Weight:  [150 lb 12.7 oz (68.4 kg)] 150 lb 12.7 oz (68.4 kg) (01/02 0558) General: awake, alert, nad  Skin: no rashes  Lungs: CTA B  Cor: RRR with murmur  Abdomen: soft, nt, nd  Ext: left wrist with edema, some tenderness, improved per patient    Lab Results  Recent Labs  05/29/13 0430 05/31/13 0630 05/31/13 0935  WBC 8.8 6.7 7.2  HGB 8.5* 6.7* 6.9*  HCT 25.8* 19.3* 20.9*  NA 139 135*  --   K 3.8 4.1  --   CL 106 102  --   CO2 19 17*  --   BUN 22 35*  --   CREATININE 1.55* 2.25*  --    Liver  Panel  Recent Labs  05/28/13 1930 05/31/13 0630  PROT 7.2 6.6  ALBUMIN 2.2* 1.8*  AST 21 17  ALT 21 18  ALKPHOS 132* 100  BILITOT 1.3* 1.3*  BILIDIR 1.0*  --   IBILI 0.3  --     Microbiology: Blood cx 1/1 NGTD Urine cx 1/1 NGTD Studies/Results: No results found.   Assessment/Plan: hiv = due to her worsening renal failure, will recommend to change her to a different HIV regimen. We will discontinue truvada. Start epzicom daily ( which is tenofovir sparing) in addition to [place]okaletra for now. She will need new rx on discharge for this medication since it is new for her.   ckd = slightly worsened from baseline. We will change her hiv regimen to be tenofovir sparing.  Gout flare = due to her renal failure/CKD, colchicine dosing is limited to only a treatment dose of 1.2mg   x 1 day. After today's dose, i would discontinue colchicine and treat with prednisone only.   Hypertension = please ensure she can be maintain  adherence on her current multidrug regimen for mgmt of HTN since this is the 2nd hospitalization for poorly controlled HTN in the last month. Please review which meds are BID vs. TID for discharge  hsv proph = continue on acyclovir  Michelle [name], [facility] Cell: [phone] Pager: [phone]  05/31/2013, 12:43 PM

## 2013-05-31 NOTE — Progress Notes (Signed)
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 Acetaminophen is also listed as a possibility but very low on the list. Hydralazine is listed but she has not received any doses while admitted.   Will follow along with you, please let us know if there is anything else we can do.  Thank you for allowing [place] to be a part of this patient's care.  Sheppard Coil PharmD., BCPS Clinical Pharmacist Pager [phone] 05/31/2013 3:45 PM

## 2013-06-01 ENCOUNTER — Encounter (HOSPITAL_COMMUNITY): Payer: Self-pay | Admitting: Internal Medicine

## 2013-06-01 DIAGNOSIS — I5189 Other ill-defined heart diseases: Secondary | ICD-10-CM | POA: Diagnosis present

## 2013-06-01 DIAGNOSIS — I272 Pulmonary hypertension, unspecified: Secondary | ICD-10-CM | POA: Diagnosis present

## 2013-06-01 DIAGNOSIS — I519 Heart disease, unspecified: Secondary | ICD-10-CM

## 2013-06-01 DIAGNOSIS — D591 Autoimmune hemolytic anemia, unspecified: Secondary | ICD-10-CM

## 2013-06-01 DIAGNOSIS — E46 Unspecified protein-calorie malnutrition: Secondary | ICD-10-CM | POA: Diagnosis present

## 2013-06-01 LAB — BODY FLUID CULTURE: Culture: NO GROWTH

## 2013-06-01 LAB — CBC
HEMATOCRIT: 25.5 % — AB (ref 36.0–46.0)
HEMATOCRIT: 24.5 % — AB (ref 36.0–46.0)
Hemoglobin: 8.7 g/dL — ABNORMAL LOW (ref 12.0–15.0)
Hemoglobin: 8.5 g/dL — ABNORMAL LOW (ref 12.0–15.0)
MCH: 33 pg (ref 26.0–34.0)
MCH: 33.2 pg (ref 26.0–34.0)
MCHC: 34.1 g/dL (ref 30.0–36.0)
MCHC: 34.7 g/dL (ref 30.0–36.0)
MCV: 96.6 fL (ref 78.0–100.0)
MCV: 95.7 fL (ref 78.0–100.0)
Platelets: 245 10*3/uL (ref 150–400)
Platelets: 232 10*3/uL (ref 150–400)
RBC: 2.64 MIL/uL — AB (ref 3.87–5.11)
RBC: 2.56 MIL/uL — AB (ref 3.87–5.11)
RDW: 15.2 % (ref 11.5–15.5)
RDW: 14.9 % (ref 11.5–15.5)
WBC: 5.9 10*3/uL (ref 4.0–10.5)
WBC: 5 10*3/uL (ref 4.0–10.5)

## 2013-06-01 LAB — COMPREHENSIVE METABOLIC PANEL
ALT: 17 U/L (ref 0–35)
AST: 19 U/L (ref 0–37)
Albumin: 1.9 g/dL — ABNORMAL LOW (ref 3.5–5.2)
Alkaline Phosphatase: 106 U/L (ref 39–117)
BUN: 35 mg/dL — ABNORMAL HIGH (ref 6–23)
CO2: 17 meq/L — AB (ref 19–32)
Calcium: 8 mg/dL — ABNORMAL LOW (ref 8.4–10.5)
Chloride: 103 mEq/L (ref 96–112)
Creatinine, Ser: 1.98 mg/dL — ABNORMAL HIGH (ref 0.50–1.10)
GFR calc Af Amer: 32 mL/min — ABNORMAL LOW (ref >90)
GFR, EST NON AFRICAN AMERICAN: 27 mL/min — AB (ref >90)
Glucose, Bld: 152 mg/dL — ABNORMAL HIGH (ref 70–99)
Potassium: 4.7 mEq/L (ref 3.7–5.3)
Sodium: 136 mEq/L — ABNORMAL LOW (ref 137–147)
Total Bilirubin: 1 mg/dL (ref 0.3–1.2)
Total Protein: 7.5 g/dL (ref 6.0–8.3)

## 2013-06-01 LAB — LEVETIRACETAM LEVEL: Levetiracetam Lvl: 75.8 ug/mL — ABNORMAL HIGH (ref 5.0–30.0)

## 2013-06-01 MED ORDER — LAMIVUDINE 150 MG PO TABS
150.0000 mg | ORAL_TABLET | Freq: Every day | ORAL | Status: DC
  Administered 2013-06-02: 150 mg via ORAL
  Filled 2013-06-01: qty 1

## 2013-06-01 MED ORDER — ABACAVIR SULFATE 300 MG PO TABS
600.0000 mg | ORAL_TABLET | Freq: Every day | ORAL | Status: DC
  Administered 2013-06-02: 600 mg via ORAL
  Filled 2013-06-01: qty 2

## 2013-06-01 NOTE — Evaluation (Signed)
Physical Therapy Evaluation Patient Details Name: Michelle [name] MRN: [id number] DOB: 23-Feb-1961 Today's Date: 06/01/2013 Time: 6283-6629 PT Time Calculation (min): 17 min  PT Assessment / Plan / Recommendation History of Present Illness  pt is a 53 y.o. female adm due to Lt wrist pain. PMH of HTN with recent admission for HTN emergency, CKD stage 3 (baseline Cr ~1.7), well controlled DM2 (last A1C 5.0 on 05/14/13), well controlled HIV (last CD4 490, VL <20 on 05/07/13), HCV, seizure disorder, migraines, DM2, herpes simplex, gout   Clinical Impression  Pt adm due to the above. Presents with decreased independence with functional mobility secondary to deficits listed below (see PT problem list). Pt to benefit from skilled acute PT to address deficits listed below and increase independence with mobility. Pt lives at home alone and has good family support per pt. Pt stated she can have 24/7 (A) upon acute D/C. Is applying to receive [facility] aide. Would benefit from OT evaluation to assess ADLs needs. Pt will benefit from RW upon acute D/C to mobilize with and reduce risk of falls.     PT Assessment  Patient needs continued PT services    Follow Up Recommendations  Home health PT;Supervision/Assistance - 24 hour    Does the patient have the potential to tolerate intense rehabilitation      Barriers to Discharge Decreased caregiver support pt lives alone     Equipment Recommendations  Rolling walker with 5" wheels;3in1 (PT)    Recommendations for Other Services OT consult   Frequency Min 3X/week    Precautions / Restrictions Precautions Precautions: Fall Precaution Comments: pt reports she has fall  times in last month; seizures  Restrictions Weight Bearing Restrictions: No   Pertinent Vitals/Pain 7/10 in Lt wrist with ambulating.      Mobility  Bed Mobility Bed Mobility: Supine to Sit;Sitting - Scoot to Edge of Bed;Sit to Supine Supine to Sit: 6: Modified independent (Device/Increase  time);HOB elevated;With rails Sitting - Scoot to Edge of Bed: 6: Modified independent (Device/Increase time);With rail Sit to Supine: 6: Modified independent (Device/Increase time);HOB flat Details for Bed Mobility Assistance: relies on handrails; requires incr time  Transfers Transfers: Sit to Stand;Stand to Sit Sit to Stand: 5: Supervision;From bed Stand to Sit: 5: Supervision;To bed Details for Transfer Assistance: supervision for safety; cues for hand placement  Ambulation/Gait Ambulation/Gait Assistance: 5: Supervision Ambulation Distance (Feet): 40 Feet Assistive device: Rolling walker Ambulation/Gait Assistance Details: cues to stay within RW; ambulates at decreased speed;  c/o fatigue and required 1 standing rest break  Gait Pattern: Step-through pattern;Decreased stride length;Narrow base of support Gait velocity: decreased; guarded General Gait Details: c/o pain in Lt wrist when ambulating with RW  Stairs: No Wheelchair Mobility Wheelchair Mobility: No         PT Diagnosis: Difficulty walking;Acute pain  PT Problem List: Decreased balance;Decreased mobility;Decreased knowledge of use of DME;Decreased safety awareness;Pain PT Treatment Interventions: DME instruction;Gait training;Functional mobility training;Therapeutic activities;Therapeutic exercise;Balance training;Neuromuscular re-education;Patient/family education     PT Goals(Current goals can be found in the care plan section) Acute Rehab PT Goals Patient Stated Goal: to go home with family tomorrow  PT Goal Formulation: With patient Potential to Achieve Goals: Good  Visit Information  Last PT Received On: 06/01/13 Assistance Needed: +1 History of Present Illness: pt is a 53 y.o. female        Prior Functioning  Home Living Family/patient expects to be discharged to:: Private residence Living Arrangements: Alone Available Help at Discharge: Family;Available PRN/intermittently  Type of Home: Apartment Home  Access: Level entry Home Layout: One level Home Equipment: Cane - single point;Grab bars - tub/shower;Grab bars - toilet;Walker - standard Additional Comments: pt reports she has tub shower and standard toilet; pt reports she has difficulty standing from toilet  Prior Function Level of Independence: Needs assistance Gait / Transfers Assistance Needed: walks with SPC  in house and with her walker as needed in community; at times will use motoroize scooter in community   ADL's / Homemaking Assistance Needed: (A) to take a bath; gets dressed independently and can fix small meals independently  Comments: pt has family member come in every other day to help pt bath, clean house and cook  Communication Communication: No difficulties Dominant Hand: Right    Cognition  Cognition Arousal/Alertness: Awake/alert Behavior During Therapy: Impulsive Overall Cognitive Status: Within Functional Limits for tasks assessed    Extremity/Trunk Assessment Upper Extremity Assessment Upper Extremity Assessment: Overall WFL for tasks assessed (c/o pain in Lt wrist 7.5/10) Lower Extremity Assessment Lower Extremity Assessment: Overall WFL for tasks assessed (c/o numbness in bil LEs intermittently) Cervical / Trunk Assessment Cervical / Trunk Assessment: Normal   Balance Balance Balance Assessed: Yes Static Sitting Balance Static Sitting - Balance Support: Feet supported;Bilateral upper extremity supported Static Sitting - Level of Assistance: 5: Stand by assistance Static Standing Balance Static Standing - Balance Support: Bilateral upper extremity supported;During functional activity Static Standing - Level of Assistance: 5: Stand by assistance High Level Balance High Level Balance Activites: Direction changes High Level Balance Comments: cues for RW sequencing with directional changes; no LOB noted  End of Session PT - End of Session Equipment Utilized During Treatment: Gait belt Activity Tolerance:  Patient tolerated treatment well Patient left: in bed;with call bell/phone within reach Nurse Communication: Mobility status  GP     Donell SievertWest, [name], [place]PT [phone] 06/01/2013, 2:43 PM

## 2013-06-01 NOTE — Progress Notes (Signed)
Subjective: Patient seen and examined at the bedside this morning. She feels great. Her left wrist pain is much better. Her right wrist pain has gone away. Her right ankle pain has improved, though she still has some tenderness over the metatarsals. She denies dizziness, fatigue, shortness of breath. She still has a mild headache and mild chest pain but these are much improved from prior. She is ready to go home but amenable to staying one more day to watch her blood counts and BP. She would like a thorough review of her discharge medications when ready to go, since she has moved several times over the past year and is a bit confused about her regimen. We reassured her we would absolutely do this.  Objective: Vital signs in last 24 hours: Filed Vitals:   06/01/13 0140 06/01/13 0245 06/01/13 0343 06/01/13 0535  BP: 125/73 134/77 125/74 142/78  Pulse: 89 88 86 87  Temp: 98.9 F (37.2 C) 98.7 F (37.1 C) 98.7 F (37.1 C) 98.5 F (36.9 C)  TempSrc: Oral Oral Oral Oral  Resp: 18 20 18 18   Height:      Weight:    182 lb 5.1 oz (82.7 kg)  SpO2: 98% 98% 100% 99%   Weight change: 31 lb 8.4 oz (14.3 kg)  Intake/Output Summary (Last 24 hours) at 06/01/13 0900 Last data filed at 05/31/13 2129  Gross per 24 hour  Intake    240 ml  Output   1850 ml  Net  -1610 ml   Physical Exam  Constitutional: She is oriented to person, place, and time and well-developed, well-nourished, and in no distress.  HENT:  Head: Normocephalic and atraumatic.  Eyes: Conjunctivae and EOM are normal. Pupils are equal, round, and reactive to light. No scleral jaundice. Neck: Normal range of motion. Neck supple.  Cardiovascular: Normal rate, regular rhythm, normal heart sounds and intact distal pulses. Exam reveals no gallop and no friction rub.  1-2/6 systolic murmur heard best at LLSB with deep inspiration.  Pulmonary/Chest: Effort normal and breath sounds normal.  Abdominal: Soft. She exhibits no distension.  There is no tenderness.  Musculoskeletal:  Right wrist: Normal. She exhibits normal range of motion, no tenderness and no swelling.  Left wrist: She exhibits improvement in ROM of left fingers, elbow, shoulder. Still with tenderness (of left wrist to direct palaption, improving). Improvement in swelling.  Right ankle: She exhibits normal range of motion and no swelling. Some mild tenderness to direct palpation of dorsal metatarsal heads. No tenderness to palpation of MCP joint or ankle. Left ankle: Normal. She exhibits normal range of motion and no swelling. No tenderness. Pulses intact.  Neurological: She is alert and oriented to person, place, and time. No cranial nerve deficit. GCS score is 15. Brudzinski and Kernig signs negative.  Skin: Skin is warm and dry. No rash noted.  No splinter hemorrhages, no Janeway lesions on hands or feet.   Psychiatric: Affect normal.  Answers clear and appropriate.   Lab Results: Basic Metabolic Panel:  Recent Labs Lab 05/28/13 1930  05/31/13 0630 06/01/13 0548  NA  --   < > 135* 136*  K  --   < > 4.1 4.7  CL  --   < > 102 103  CO2  --   < > 17* 17*  GLUCOSE  --   < > 142* 152*  BUN  --   < > 35* 35*  CREATININE  --   < > 2.25* 1.98*  CALCIUM  --   < > 7.2* 8.0*  MG 1.4*  --   --   --   < > = values in this interval not displayed. Liver Function Tests:  Recent Labs Lab 05/31/13 0630 06/01/13 0548  AST 17 19  ALT 18 17  ALKPHOS 100 106  BILITOT 1.3* 1.0  PROT 6.6 7.5  ALBUMIN 1.8* 1.9*   No results found for this basename: LIPASE, AMYLASE,  in the last 168 hours No results found for this basename: AMMONIA,  in the last 168 hours CBC:  Recent Labs Lab 05/28/13 1046  05/31/13 0935 06/01/13 0548  WBC 6.4  < > 7.2 5.9  NEUTROABS 4.7  --   --   --   HGB 9.0*  < > 6.9* 8.7*  HCT 27.7*  < > 20.9* 25.5*  MCV 102.6*  < > 101.0* 96.6  PLT 200  < > 198 245  < > = values in this interval not displayed. Cardiac Enzymes:  Recent  Labs Lab 05/30/13 1005 05/30/13 1410 05/30/13 2055  TROPONINI <0.30 <0.30 <0.30   BNP: No results found for this basename: PROBNP,  in the last 168 hours D-Dimer: No results found for this basename: DDIMER,  in the last 168 hours CBG:  Recent Labs Lab 05/28/13 1645 05/28/13 2025 05/28/13 2345 05/29/13 0409 05/29/13 0755  GLUCAP 89 137* 151* 109* 109*   Hemoglobin A1C: No results found for this basename: HGBA1C,  in the last 168 hours Fasting Lipid Panel: No results found for this basename: CHOL, HDL, LDLCALC, TRIG, CHOLHDL, LDLDIRECT,  in the last 168 hours Thyroid Function Tests: No results found for this basename: TSH, T4TOTAL, FREET4, T3FREE, THYROIDAB,  in the last 168 hours Coagulation: No results found for this basename: LABPROT, INR,  in the last 168 hours Anemia Panel:  Recent Labs Lab 05/31/13 0935  RETICCTPCT 1.9   Urine Drug Screen: Drugs of Abuse     Component Value Date/Time   LABOPIA POSITIVE* 05/28/2013 1737   COCAINSCRNUR NONE DETECTED 05/28/2013 1737   LABBENZ NONE DETECTED 05/28/2013 1737   AMPHETMU NONE DETECTED 05/28/2013 1737   THCU NONE DETECTED 05/28/2013 1737   LABBARB NONE DETECTED 05/28/2013 1737    Alcohol Level: No results found for this basename: ETH,  in the last 168 hours Urinalysis:  Recent Labs Lab 05/30/13 1349  COLORURINE YELLOW  LABSPEC 1.020  PHURINE 6.0  GLUCOSEU NEGATIVE  HGBUR TRACE*  BILIRUBINUR NEGATIVE  KETONESUR NEGATIVE  PROTEINUR 100*  UROBILINOGEN 0.2  NITRITE NEGATIVE  LEUKOCYTESUR TRACE*   Misc. Labs:   Micro Results: Recent Results (from the past 240 hour(s))  BODY FLUID CULTURE     Status: None   Collection Time    05/28/13  1:38 PM      Result Value Range Status   Specimen Description SYNOVIAL FLUID LEFT WRIST   Final   Special Requests 0.3ML   Final   Gram Stain     Final   Value: RARE WBC PRESENT,BOTH PMN AND MONONUCLEAR     NO ORGANISMS SEEN     Performed at [facility]    Culture     Final   Value: NO GROWTH 3 DAYS     Performed at [facility]   Report Status PENDING   Incomplete  GONOCOCCUS CULTURE     Status: None   Collection Time    05/28/13  1:38 PM      Result Value Range Status   Specimen  Description FLUID SYNOVIAL LEFT WRIST   Final   Special Requests NONE   Final   Culture     Final   Value: NO GROWTH 2 DAYS     Performed at [facility]   Report Status 05/31/2013 FINAL   Final  CULTURE, BLOOD (ROUTINE X 2)     Status: None   Collection Time    05/28/13  4:10 PM      Result Value Range Status   Specimen Description BLOOD HAND RIGHT   Final   Special Requests BOTTLES DRAWN AEROBIC ONLY 5CC   Final   Culture  Setup Time     Final   Value: 05/28/2013 22:49     Performed at [facility]   Culture     Final   Value:        BLOOD CULTURE RECEIVED NO GROWTH TO DATE CULTURE WILL BE HELD FOR 5 DAYS BEFORE ISSUING A FINAL NEGATIVE REPORT     Performed at [facility]   Report Status PENDING   Incomplete  CULTURE, BLOOD (ROUTINE X 2)     Status: None   Collection Time    05/28/13  4:15 PM      Result Value Range Status   Specimen Description BLOOD HAND RIGHT   Final   Special Requests BOTTLES DRAWN AEROBIC ONLY 3CC   Final   Culture  Setup Time     Final   Value: 05/28/2013 22:48     Performed at [facility]   Culture     Final   Value:        BLOOD CULTURE RECEIVED NO GROWTH TO DATE CULTURE WILL BE HELD FOR 5 DAYS BEFORE ISSUING A FINAL NEGATIVE REPORT     Performed at [facility]   Report Status PENDING   Incomplete  GC/CHLAMYDIA PROBE AMP     Status: None   Collection Time    05/28/13  5:38 PM      Result Value Range Status   CT Probe RNA NEGATIVE  NEGATIVE Final   GC Probe RNA NEGATIVE  NEGATIVE Final   Comment: (NOTE)                                                                                               **Normal Reference Range: Negative**          Assay performed using the  Gen-Probe APTIMA COMBO2 (R) Assay.     Acceptable specimen types for this assay include APTIMA Swabs (Unisex,     endocervical, urethral, or vaginal), first void urine, and ThinPrep     liquid based cytology samples.     Performed at [facility]  GONOCOCCUS CULTURE     Status: None   Collection Time    05/28/13  7:00 PM      Result Value Range Status   Specimen Description THROAT   Final   Special Requests NONE   Final   Culture     Final   Value: NO GROWTH 2 DAYS     Performed at [facility]  Report Status 05/31/2013 FINAL   Final  CHLAMYDIA CULTURE     Status: None   Collection Time    05/28/13  7:00 PM      Result Value Range Status   Specimen Description THROAT   Final   Special Requests NONE   Final   Culture     Final   Value: No Chlamydia identified in cell culture     [facility]   Report Status 05/30/2013 FINAL   Final  MRSA PCR SCREENING     Status: None   Collection Time    05/28/13  7:08 PM      Result Value Range Status   MRSA by PCR NEGATIVE  NEGATIVE Final   Comment:            The GeneXpert MRSA Assay (FDA     approved for NASAL specimens     only), is one component of a     comprehensive MRSA colonization     surveillance program. It is not     intended to diagnose MRSA     infection nor to guide or     monitor treatment for     MRSA infections.  CULTURE, BLOOD (ROUTINE X 2)     Status: None   Collection Time    05/29/13 10:20 PM      Result Value Range Status   Specimen Description BLOOD RIGHT HAND   Final   Special Requests BOTTLES DRAWN AEROBIC ONLY 2CC   Final   Culture  Setup Time     Final   Value: 05/30/2013 04:20     [facility]formed [facility]   Culture     Final   Value:        BLOOD CULTURE RECEIVED NO GROWTH TO DATE CULTURE WILL BE HELD FOR 5 DAYS BEFORE ISSUING A FINAL NEGATIVE REPORT     [facility]   Report Status PENDING   Incomplete  CULTURE, BLOOD (ROUTINE X 2)      Status: None   Collection Time    05/29/13 10:30 PM      Result Value Range Status   Specimen Description BLOOD RIGHT ARM   Final   Special Requests BOTTLES DRAWN AEROBIC ONLY 3CC   Final   Culture  Setup Time     Final   Value: 05/30/2013 04:19     [facility]formed at [facility]   Culture     Final   Value:        BLOOD CULTURE RECEIVED NO GROWTH TO DATE CULTURE WILL BE HELD FOR 5 DAYS BEFORE ISSUING A FINAL NEGATIVE REPORT     [facility]formed [facility]   Report Status PENDING   Incomplete  CULTURE, BLOOD (SINGLE)     Status: None   Collection Time    05/30/13 10:50 AM      Result Value Range Status   Specimen Description BLOOD LEFT HAND   Final   Special Requests BOTTLES DRAWN AEROBIC ONLY 3CC   Final   Culture  Setup Time     Final   Value: 05/30/2013 14:35     [facility]formed at [facility]   Culture     Final   Value:        BLOOD CULTURE RECEIVED NO GROWTH TO DATE CULTURE WILL BE HELD FOR 5 DAYS BEFORE ISSUING A FINAL NEGATIVE REPORT     [facility]formed [facility]  Report Status PENDING   Incomplete  URINE CULTURE     Status: None   Collection Time    05/30/13  1:50 PM      Result Value Range Status   Specimen Description URINE, RANDOM   Final   Special Requests NONE   Final   Culture  Setup Time     Final   Value: 05/31/2013 01:30     Performed at [facility] Count     Final   Value: NO GROWTH     Performed at [facility]   Culture     Final   Value: NO GROWTH     Performed at [facility]   Report Status 05/31/2013 FINAL   Final   Studies/Results: No results found. Medications: I have reviewed the patient's current medications. Scheduled Meds: . abacavir  600 mg Oral Daily   And  . lamiVUDine  300 mg Oral Daily  . acyclovir  800 mg Oral Daily  . aspirin EC  81 mg Oral Daily  . cloNIDine  0.2 mg Oral BID  . furosemide  40 mg Oral Daily  . heparin  5,000 Units Subcutaneous Q8H  . labetalol  100 mg  Oral BID  . levETIRAcetam  1,500 mg Oral Q12H  . lisinopril  40 mg Oral Daily  . lopinavir-ritonavir  2 tablet Oral BID  . LORazepam  1 mg Oral Daily  . pantoprazole  40 mg Oral Daily  . phenytoin  100 mg Oral TID  . predniSONE  70 mg Oral Q breakfast   Continuous Infusions:  PRN Meds:.acetaminophen, diphenhydrAMINE, HYDROcodone-acetaminophen, nitroGLYCERIN Assessment/Plan: Michelle [name] is a 53 y.o. woman PMH of HTN with recent admission for HTN emergency, CKD stage 3 (baseline Cr ~1.7), well controlled DM2 (last A1C 5.0 on 05/14/13), well controlled HIV (last CD4 490, VL <20 on 05/07/13), HCV, seizure disorder, migraines, DM2, herpes simplex, gout, who presents to the ED with left wrist pain and then later developed altered mental status.   #Hypertensive emergency with encephalopathy, resolved - Patient was transferred to the ICU on 05/28/13 for nicardipine drip. BP was controlled and drip was discontinued on 12/31. She has history of hypertension emergency causing encephalopathy secondary to medication noncompliance. BP now well controlled on home medications and mental status is baseline. This morning we stopped her hydralazine 2/2 AIHA (see problem below), so we will keep a close eye on her BP today and up titrate her other medications as needed. - Continue home Lasix 40 mg daily, labetalol 100 mg twice a day, lisinopril 40 mg daily, clonidine 0.2 mg twice a day - Stop hydralazine 50 mg 3 times a day - Cardiac monitoring  - Daily weights  - Ins and outs  - Low sodium diet  - Aspirin 81 mg daily   #Left wrist arthralgia with fever, resolved - Patient presented with left wrist arthralgia and swelling and history of migrating monoarthritis. Patient has a history of gout and was recently taken off her colchicine due to chronic renal failure. Synovial fluid analysis here was negative for crystals, but per ID the fact that the specimen clotted makes this analysis less useful. ID feels her  clinical course of migrating monoarthritis, response to steroids, exquisite pain, and history of renal disease and hypertension make gout most likely. ID does not suspect septic arthritis since this has affected multiple joints. We have discontinued antibiotics. Culture of synovial fluid is NGTD. Fevers have resolved since stopping Vanc/Ceftriaxone,  but we will continue to monitor. Urine PCR probe for GC/chlamydia was negative, and she has no skin lesions and no reported recent sexual contacts. Gonococcus culture of throat and synovial fluid is NGTD. She has not been in a Lyme endemic area, so her positive B burdorferi Ab is not helpful/ suggestive of disease per ID. Flu panel was negative. UA with trace leukocytes and few bacteria, she is asymptomatic, urine culture with no growth. 2D echo was checked as patient has a mild murmur. It was notable for a focal thickening of the tip of the left coronary leaflet of the aortic valve, no regurgitation. This likely represents a calcification. Will not start antibiotics or pursue TEE at this time. Multiple blood cultures are NGTD. - Appreciate ID recs - Discontinue colchicine 0.6mg  daily 2/2 CKD (she is s/p 1.2mg  which is treatment dose) - Starting steroids as below - Follow up synovial fluid culture > NGTD - Follow up rectal and throat swab for GC/chlamydia > NGTD - Follow up blood cultures 12/30, 12/31, 1/1 > NGTD - Pain control with home Norco 10-325 every 4 hours when necessary  - PT eval and treat - CBC, CMP in am   #Autoimmune hemolytic anemia with warm agglutinins - On 1/2 her Hgb dropped from 8.5 to 6.7, repeat was 6.9. She was asymptomatic. We suspected hemolysis as she had a bilirubinemia (1.3) and no active bleeding. Labs revealed LDH 309 (high), and a positive Coombs test for IgG and complement. Warm agglutinins were identified. Other labs: Reticulocyte count 39.3 (wnl), Haptoglobin 94 (wnl), technologist smear unremarkable. 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  Ceftriaxone is a known culprit (she got a dose on 12/31). Hydralazine has also been implicated, and she takes 50mg  po TID; less likely as this is a home med, but compliance is uncertain. We starting high dose steroids and transfused 1U PRBC yesterday, with a more-than-appropriate hemoglobin response to 8.7. Of note, she does have a baseline macrocytic anemia. Baseline hemoglobin 8.5-9.0; MCV 100-102. Iron panel from 12/16 showed iron 40 (slightly low), TIBC 251 (wnl), Sat ratio 16 (low). B12 488 (wnl) and folate 10.4 (wnl). Chronic etiologies include HIV meds, HCV, CKD. - Continue prednisone 70mg  po daily (1mg /kg), day 2/7, after which we will do a long taper - Discontinue hydralazine (both scheduled and prn) - Ceftriaxone has been added to her allergy list - Repeat CBC this afternoon   Hemoglobin  Date Value Range Status  06/01/2013 8.7* 12.0 - 15.0 g/dL Final     DELTA CHECK NOTED     REPEATED TO VERIFY  05/31/2013 6.9* 12.0 - 15.0 g/dL Final     CRITICAL VALUE NOTED.  VALUE IS CONSISTENT WITH PREVIOUSLY REPORTED AND CALLED VALUE.  05/31/2013 6.7* 12.0 - 15.0 g/dL Final     REPEATED TO VERIFY     CRITICAL RESULT CALLED TO, READ BACK BY AND VERIFIED WITH:     MAKRIS,L RN [id number] 05/31/13 LEONARD,A  05/29/2013 8.5* 12.0 - 15.0 g/dL Final  16/02/9603 9.0* 12.0 - 15.0 g/dL Final    #Low CO2 - Patient has had a low CO2 over the past few days. This is mostly chronic, baseline is ~19-21. AG 16. Likely 2/2 CKD. She may need alkali therapy to maintain the serum bicarbonate concentration in the normal range. - Continue to monitor - Will consider starting sodium bicarbonate 0.5 to 1 meq/kg per day based on CMP results tomorrow  CO2  Date Value Range Status  06/01/2013 17* 19 - 32 mEq/L Final  05/31/2013 17* 19 -  32 mEq/L Final  05/29/2013 19  19 - 32 mEq/L Final  05/28/2013 23  19 - 32 mEq/L Final  05/21/2013 19  19 - 32 mEq/L Final  05/14/2013 17* 19 - 32 mEq/L Final  05/13/2013 19  19 - 32 mEq/L Final    05/12/2013 19  19 - 32 mEq/L Final  05/11/2013 21  19 - 32 mEq/L Final  05/10/2013 21  19 - 32 mEq/L Final    #Grade 2 diastolic dysfunction - Found on 2D echo 05/31/13. Likely 2/2 uncontrolled hypertension. Currently asymptomatic.  #Chest pain, resolved - Improved with Fentanyl IV x1 and her home Norco. Cycled troponins and negative x3. No EKG changes. Likely musculoskeletal vs. Anxiety. - Continue to monitor  #Seizure disorder - Home medications include Keppra 1500 mg twice a day, phenytoin 100 mg 3 times a day, Ativan 1 mg daily. Dilantin level here was <2.5, suggesting noncompliance. However her Keppra level was supra therapeutic at 75.8 (normal range 5-30). Per pharmacy, no need to reduce dosage if mental status is baseline. - Continue home medications  #Hepatitis C genotype 1b - Poorly controlled. Very high HCV quant on 05/21/13. LFTs show elevated alkaline phosphatase (stable from prior) and elevated direct bilirubin (1.0). - Daily CMP  Hepatic Function Panel     Component Value Date/Time   PROT 7.5 06/01/2013 0548   ALBUMIN 1.9* 06/01/2013 0548   AST 19 06/01/2013 0548   ALT 17 06/01/2013 0548   ALKPHOS 106 06/01/2013 0548   BILITOT 1.0 06/01/2013 0548   BILIDIR 1.0* 05/28/2013 1930   IBILI 0.3 05/28/2013 1930    #History of herpes simplex - Patient denies active lesions.  - Continue acyclovir 800 mg daily  #CKD stage 3 - Creatinine currently 1.6, which is her baseline.  - Continue to monitor   #HIV - Last CD4 490, VL <20 on 05/07/13. She established care with Dr. Drue Second on 05/21/13. 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 Dr. Drue Second saw her in consultation on 1/2 and recommended discontinuing Truvada and starting Epzicom (which is tenofovir sparing) due to worsening renal failure. - Stopped Truvada 200-300 mg one tablet daily  - Start Epzicom 600-300 mg daily - Continue Kaletra 200-50 mg 2 tablets twice a day,  #History of syphilis - Labs from 05/07/13 show RPR  reactive, titer 1:1, T pallidum antibodies >8.00.  - Per Dr. Feliz Beam note, she was treated many years ago and there is no current need to treat.   #Diabetes - Well-controlled, most recent A1c was 5.0%.   #GERD - Protonix 40 mg daily.   #DVT PPX - Heparin subcutaneous.   Dispo: Disposition is deferred at this time, awaiting improvement of current medical problems.  Anticipated discharge in approximately 1-3 day(s).   The patient does have a current PCP Christen Bame, MD) and does need an [facility] hospital follow-up appointment after discharge.  The patient does not have transportation limitations that hinder transportation to clinic appointments.  .Services Needed at time of discharge: Y = Yes, Blank = No PT:   OT:   RN:   Equipment:   Other:     LOS: 4 days   Vivi Barrack, MD 06/01/2013, 9:00 AM

## 2013-06-02 LAB — COMPREHENSIVE METABOLIC PANEL
ALT: 14 U/L (ref 0–35)
AST: 16 U/L (ref 0–37)
Albumin: 2.8 g/dL — ABNORMAL LOW (ref 3.5–5.2)
Alkaline Phosphatase: 61 U/L (ref 39–117)
BILIRUBIN TOTAL: 0.4 mg/dL (ref 0.3–1.2)
BUN: 8 mg/dL (ref 6–23)
CHLORIDE: 110 meq/L (ref 96–112)
CO2: 24 meq/L (ref 19–32)
CREATININE: 0.9 mg/dL (ref 0.50–1.10)
Calcium: 8.6 mg/dL (ref 8.4–10.5)
GFR calc Af Amer: 83 mL/min — ABNORMAL LOW (ref >90)
GFR, EST NON AFRICAN AMERICAN: 71 mL/min — AB (ref >90)
Glucose, Bld: 87 mg/dL (ref 70–99)
Potassium: 4.2 mEq/L (ref 3.7–5.3)
Sodium: 143 mEq/L (ref 137–147)
Total Protein: 5.8 g/dL — ABNORMAL LOW (ref 6.0–8.3)

## 2013-06-02 LAB — CBC
HEMATOCRIT: 24.6 % — AB (ref 36.0–46.0)
Hemoglobin: 8.3 g/dL — ABNORMAL LOW (ref 12.0–15.0)
MCH: 32.5 pg (ref 26.0–34.0)
MCHC: 33.7 g/dL (ref 30.0–36.0)
MCV: 96.5 fL (ref 78.0–100.0)
Platelets: 268 10*3/uL (ref 150–400)
RBC: 2.55 MIL/uL — AB (ref 3.87–5.11)
RDW: 14.6 % (ref 11.5–15.5)
WBC: 6.5 10*3/uL (ref 4.0–10.5)

## 2013-06-02 MED ORDER — ASPIRIN EC 81 MG PO TBEC: 81.0000 mg | DELAYED_RELEASE_TABLET | Freq: Every day | ORAL | Status: DC

## 2013-06-02 MED ORDER — ABACAVIR SULFATE-LAMIVUDINE 600-300 MG PO TABS: 1.0000 | ORAL_TABLET | Freq: Every day | ORAL | Status: DC

## 2013-06-02 MED ORDER — PREDNISONE 20 MG PO TABS: 60.0000 mg | ORAL_TABLET | Freq: Every day | ORAL | Status: DC

## 2013-06-02 MED ORDER — LOPINAVIR-RITONAVIR 200-50 MG PO TABS: 2.0000 | ORAL_TABLET | Freq: Two times a day (BID) | ORAL | Status: DC

## 2013-06-02 MED ORDER — PHENYTOIN 50 MG PO CHEW: 100.0000 mg | CHEWABLE_TABLET | Freq: Three times a day (TID) | ORAL | Status: DC

## 2013-06-02 MED ORDER — LEVETIRACETAM 500 MG PO TABS: 1500.0000 mg | ORAL_TABLET | Freq: Two times a day (BID) | ORAL | Status: DC

## 2013-06-02 MED ORDER — PANTOPRAZOLE SODIUM 40 MG PO TBEC: 40.0000 mg | DELAYED_RELEASE_TABLET | Freq: Every day | ORAL | Status: DC

## 2013-06-02 MED ORDER — LISINOPRIL 40 MG PO TABS: 40.0000 mg | ORAL_TABLET | Freq: Every day | ORAL | Status: DC

## 2013-06-02 MED ORDER — CLONIDINE HCL 0.2 MG PO TABS: 0.2000 mg | ORAL_TABLET | Freq: Two times a day (BID) | ORAL | Status: DC

## 2013-06-02 MED ORDER — LABETALOL HCL 100 MG PO TABS: 100.0000 mg | ORAL_TABLET | Freq: Two times a day (BID) | ORAL | Status: DC

## 2013-06-02 MED ORDER — ACYCLOVIR 800 MG PO TABS: 800.0000 mg | ORAL_TABLET | Freq: Every day | ORAL | Status: DC

## 2013-06-02 MED ORDER — FUROSEMIDE 40 MG PO TABS: 40.0000 mg | ORAL_TABLET | Freq: Every day | ORAL | Status: DC

## 2013-06-02 MED ORDER — HYDROCODONE-ACETAMINOPHEN 10-325 MG PO TABS: 1.0000 | ORAL_TABLET | Freq: Four times a day (QID) | ORAL | Status: DC | PRN

## 2013-06-02 NOTE — Progress Notes (Signed)
Subjective: Patient seen and examined at the bedside this morning. She feels well. She has a mild headache. Her left wrist pain is improved. She has a little bit of lumbar back pain this morning. She asks for 90 days of Norco when she leaves. I told her that we cannot prescribe this much pain medicine from the hospital, but that we can give her enough to get her through until she follows up in the internal medicine clinic next week.  Objective: Vital signs in last 24 hours: Filed Vitals:   06/01/13 0535 06/01/13 1315 06/01/13 2025 06/02/13 0423  BP: 142/78 145/81 154/77 162/87  Pulse: 87 75 80 69  Temp: 98.5 F (36.9 C) 97.9 F (36.6 C) 98.1 F (36.7 C) 97.9 F (36.6 C)  TempSrc: Oral Oral Oral Oral  Resp: 18 16 17 18   Height:      Weight: 182 lb 5.1 oz (82.7 kg)   183 lb 3.2 oz (83.099 kg)  SpO2: 99% 98% 100% 100%   Weight change: 14.1 oz (0.399 kg)  Intake/Output Summary (Last 24 hours) at 06/02/13 1025 Last data filed at 06/02/13 1610  Gross per 24 hour  Intake    240 ml  Output    550 ml  Net   -310 ml   Physical Exam  Constitutional: She is oriented to person, place, and time and well-developed, well-nourished, and in no distress.  HENT:  Head: Normocephalic and atraumatic.  Eyes: Conjunctivae and EOM are normal. Pupils are equal, round, and reactive to light. No scleral jaundice. Neck: Normal range of motion. Neck supple.  Cardiovascular: Normal rate, regular rhythm, normal heart sounds and intact distal pulses. Exam reveals no gallop and no friction rub.  1-2/6 systolic murmur heard best at LLSB with deep inspiration.  Pulmonary/Chest: Effort normal and breath sounds normal.  Abdominal: Soft. She exhibits no distension. There is no tenderness.  Musculoskeletal:  Right wrist: Normal. She exhibits normal range of motion, no tenderness and no swelling.  Left wrist: She exhibits improvement in ROM and tenderness of left fingers, elbow, shoulder. Swelling  resolved. Right ankle: She exhibits normal range of motion and no swelling. Some mild tenderness to direct palpation of dorsal metatarsal heads. No tenderness to palpation of MCP joint or ankle. Left ankle: Normal. She exhibits normal range of motion and no swelling. No tenderness. Pulses intact.  Neurological: She is alert and oriented to person, place, and time. No cranial nerve deficit. GCS score is 15. Brudzinski and Kernig signs negative.  Skin: Skin is warm and dry. No rash noted.  No splinter hemorrhages, no Janeway lesions on hands or feet.   Psychiatric: Affect normal.  Answers clear and appropriate.   Lab Results: Basic Metabolic Panel:  Recent Labs Lab 05/28/13 1930  06/01/13 0548 06/02/13 0535  NA  --   < > 136* 143  K  --   < > 4.7 4.2  CL  --   < > 103 110  CO2  --   < > 17* 24  GLUCOSE  --   < > 152* 87  BUN  --   < > 35* 8  CREATININE  --   < > 1.98* 0.90  CALCIUM  --   < > 8.0* 8.6  MG 1.4*  --   --   --   < > = values in this interval not displayed. Liver Function Tests:  Recent Labs Lab 06/01/13 0548 06/02/13 0535  AST 19 16  ALT 17 14  ALKPHOS 106 61  BILITOT 1.0 0.4  PROT 7.5 5.8*  ALBUMIN 1.9* 2.8*   No results found for this basename: LIPASE, AMYLASE,  in the last 168 hours No results found for this basename: AMMONIA,  in the last 168 hours CBC:  Recent Labs Lab 05/28/13 1046  06/01/13 1420 06/02/13 0535  WBC 6.4  < > 5.0 6.5  NEUTROABS 4.7  --   --   --   HGB 9.0*  < > 8.5* 8.3*  HCT 27.7*  < > 24.5* 24.6*  MCV 102.6*  < > 95.7 96.5  PLT 200  < > 232 268  < > = values in this interval not displayed. Cardiac Enzymes:  Recent Labs Lab 05/30/13 1005 05/30/13 1410 05/30/13 2055  TROPONINI <0.30 <0.30 <0.30   BNP: No results found for this basename: PROBNP,  in the last 168 hours D-Dimer: No results found for this basename: DDIMER,  in the last 168 hours CBG:  Recent Labs Lab 05/28/13 1645 05/28/13 2025 05/28/13 2345  05/29/13 0409 05/29/13 0755  GLUCAP 89 137* 151* 109* 109*   Hemoglobin A1C: No results found for this basename: HGBA1C,  in the last 168 hours Fasting Lipid Panel: No results found for this basename: CHOL, HDL, LDLCALC, TRIG, CHOLHDL, LDLDIRECT,  in the last 168 hours Thyroid Function Tests: No results found for this basename: TSH, T4TOTAL, FREET4, T3FREE, THYROIDAB,  in the last 168 hours Coagulation: No results found for this basename: LABPROT, INR,  in the last 168 hours Anemia Panel:  Recent Labs Lab 05/31/13 0935  RETICCTPCT 1.9   Urine Drug Screen: Drugs of Abuse     Component Value Date/Time   LABOPIA POSITIVE* 05/28/2013 1737   COCAINSCRNUR NONE DETECTED 05/28/2013 1737   LABBENZ NONE DETECTED 05/28/2013 1737   AMPHETMU NONE DETECTED 05/28/2013 1737   THCU NONE DETECTED 05/28/2013 1737   LABBARB NONE DETECTED 05/28/2013 1737    Alcohol Level: No results found for this basename: ETH,  in the last 168 hours Urinalysis:  Recent Labs Lab 05/30/13 1349  COLORURINE YELLOW  LABSPEC 1.020  PHURINE 6.0  GLUCOSEU NEGATIVE  HGBUR TRACE*  BILIRUBINUR NEGATIVE  KETONESUR NEGATIVE  PROTEINUR 100*  UROBILINOGEN 0.2  NITRITE NEGATIVE  LEUKOCYTESUR TRACE*   Misc. Labs:   Micro Results: Recent Results (from the past 240 hour(s))  BODY FLUID CULTURE     Status: None   Collection Time    05/28/13  1:38 PM      Result Value Range Status   Specimen Description SYNOVIAL FLUID LEFT WRIST   Final   Special Requests 0.3ML   Final   Gram Stain     Final   Value: RARE WBC PRESENT,BOTH PMN AND MONONUCLEAR     NO ORGANISMS SEEN     Performed at [facility]   Culture     Final   Value: NO GROWTH 3 DAYS     Performed at [facility]   Report Status 06/01/2013 FINAL   Final  GONOCOCCUS CULTURE     Status: None   Collection Time    05/28/13  1:38 PM      Result Value Range Status   Specimen Description FLUID SYNOVIAL LEFT WRIST   Final   Special  Requests NONE   Final   Culture     Final   Value: NO GROWTH 2 DAYS     Performed at [facility]   Report Status 05/31/2013 FINAL   Final  CULTURE, BLOOD (ROUTINE X 2)     Status: None   Collection Time    05/28/13  4:10 PM      Result Value Range Status   Specimen Description BLOOD HAND RIGHT   Final   Special Requests BOTTLES DRAWN AEROBIC ONLY 5CC   Final   Culture  Setup Time     Final   Value: 05/28/2013 22:49     Performed at [facility]   Culture     Final   Value:        BLOOD CULTURE RECEIVED NO GROWTH TO DATE CULTURE WILL BE HELD FOR 5 DAYS BEFORE ISSUING A FINAL NEGATIVE REPORT     Performed at [facility]   Report Status PENDING   Incomplete  CULTURE, BLOOD (ROUTINE X 2)     Status: None   Collection Time    05/28/13  4:15 PM      Result Value Range Status   Specimen Description BLOOD HAND RIGHT   Final   Special Requests BOTTLES DRAWN AEROBIC ONLY 3CC   Final   Culture  Setup Time     Final   Value: 05/28/2013 22:48     Performed at [facility]   Culture     Final   Value:        BLOOD CULTURE RECEIVED NO GROWTH TO DATE CULTURE WILL BE HELD FOR 5 DAYS BEFORE ISSUING A FINAL NEGATIVE REPORT     Performed at [facility]   Report Status PENDING   Incomplete  GC/CHLAMYDIA PROBE AMP     Status: None   Collection Time    05/28/13  5:38 PM      Result Value Range Status   CT Probe RNA NEGATIVE  NEGATIVE Final   GC Probe RNA NEGATIVE  NEGATIVE Final   Comment: (NOTE)                                                                                               **Normal Reference Range: Negative**          Assay performed using the Gen-Probe APTIMA COMBO2 (R) Assay.     Acceptable specimen types for this assay include APTIMA Swabs (Unisex,     endocervical, urethral, or vaginal), first void urine, and ThinPrep     liquid based cytology samples.     Performed at [facility]  GONOCOCCUS CULTURE     Status: None    Collection Time    05/28/13  7:00 PM      Result Value Range Status   Specimen Description THROAT   Final   Special Requests NONE   Final   Culture     Final   Value: NO GROWTH 2 DAYS     Performed at [facility]   Report Status 05/31/2013 FINAL   Final  CHLAMYDIA CULTURE     Status: None   Collection Time    05/28/13  7:00 PM      Result Value Range Status   Specimen Description THROAT   Final   Special Requests NONE  Final   Culture     Final   Value: No Chlamydia identified in cell culture     [facility]   Report Status 05/30/2013 FINAL   Final  MRSA PCR SCREENING     Status: None   Collection Time    05/28/13  7:08 PM      Result Value Range Status   MRSA by PCR NEGATIVE  NEGATIVE Final   Comment:            The GeneXpert MRSA Assay (FDA     approved for NASAL specimens     only), is one component of a     comprehensive MRSA colonization     surveillance program. It is not     intended to diagnose MRSA     infection nor to guide or     monitor treatment for     MRSA infections.  CULTURE, BLOOD (ROUTINE X 2)     Status: None   Collection Time    05/29/13 10:20 PM      Result Value Range Status   Specimen Description BLOOD RIGHT HAND   Final   Special Requests BOTTLES DRAWN AEROBIC ONLY 2CC   Final   Culture  Setup Time     Final   Value: 05/30/2013 04:20     [facility]   Culture     Final   Value:        BLOOD CULTURE RECEIVED NO GROWTH TO DATE CULTURE WILL BE HELD FOR 5 DAYS BEFORE ISSUING A FINAL NEGATIVE REPORT     [facility]   Report Status PENDING   Incomplete  CULTURE, BLOOD (ROUTINE X 2)     Status: None   Collection Time    05/29/13 10:30 PM      Result Value Range Status   Specimen Description BLOOD RIGHT ARM   Final   Special Requests BOTTLES DRAWN AEROBIC ONLY 3CC   Final   Culture  Setup Time     Final   Value: 05/30/2013 04:19     [facility]    Culture     Final   Value:        BLOOD CULTURE RECEIVED NO GROWTH TO DATE CULTURE WILL BE HELD FOR 5 DAYS BEFORE ISSUING A FINAL NEGATIVE REPORT     [facility]   Report Status PENDING   Incomplete  CULTURE, BLOOD (SINGLE)     Status: None   Collection Time    05/30/13 10:50 AM      Result Value Range Status   Specimen Description BLOOD LEFT HAND   Final   Special Requests BOTTLES DRAWN AEROBIC ONLY 3CC   Final   Culture  Setup Time     Final   Value: 05/30/2013 14:35     [facility]   Culture     Final   Value:        BLOOD CULTURE RECEIVED NO GROWTH TO DATE CULTURE WILL BE HELD FOR 5 DAYS BEFORE ISSUING A FINAL NEGATIVE REPORT     [facility]formed [facility]   Report Status PENDING   Incomplete  URINE CULTURE     Status: None   Collection Time    05/30/13  1:50 PM      Result Value Range Status   Specimen Description URINE, RANDOM   Final   Special Requests NONE  Final   Culture  Setup Time     Final   Value: 05/31/2013 01:30     Performed at [facility] Count     Final   Value: NO GROWTH     Performed at [facility]   Culture     Final   Value: NO GROWTH     Performed at [facility]   Report Status 05/31/2013 FINAL   Final   Studies/Results: No results found. Medications: I have reviewed the patient's current medications. Scheduled Meds: . lamiVUDine  150 mg Oral Daily   And  . abacavir  600 mg Oral Daily  . acyclovir  800 mg Oral Daily  . aspirin EC  81 mg Oral Daily  . cloNIDine  0.2 mg Oral BID  . furosemide  40 mg Oral Daily  . heparin  5,000 Units Subcutaneous Q8H  . labetalol  100 mg Oral BID  . levETIRAcetam  1,500 mg Oral Q12H  . lisinopril  40 mg Oral Daily  . lopinavir-ritonavir  2 tablet Oral BID  . LORazepam  1 mg Oral Daily  . pantoprazole  40 mg Oral Daily  . phenytoin  100 mg Oral TID  . predniSONE  70 mg Oral Q breakfast   Continuous Infusions:  PRN  Meds:.acetaminophen, diphenhydrAMINE, HYDROcodone-acetaminophen, nitroGLYCERIN Assessment/Plan: Michelle [name] is a 53 y.o. woman PMH of HTN with recent admission for HTN emergency, CKD stage 3 (baseline Cr ~1.7), well controlled DM2 (last A1C 5.0 on 05/14/13), well controlled HIV (last CD4 490, VL <20 on 05/07/13), HCV, seizure disorder, migraines, DM2, herpes simplex, gout, who presents to the ED with left wrist pain and then later developed altered mental status.   #Hypertensive emergency with encephalopathy, resolved - Patient was transferred to the ICU on 05/28/13 for nicardipine drip. BP was controlled and drip was discontinued on 12/31. She has history of hypertension emergency causing encephalopathy secondary to medication noncompliance. BP now decently controlled on home medications and mental status is baseline, even after discontinuing her hydralazine. She is medically stable for discharge today. We will have her followup in clinic closely for BP check and to ensure medication compliance. - Continue home Lasix 40 mg daily, labetalol 100 mg twice a day, lisinopril 40 mg daily, clonidine 0.2 mg twice a day - Cardiac monitoring  - Daily weights  - Ins and outs  - Low sodium diet  - Aspirin 81 mg daily   #Left wrist arthralgia with fever, resolved - Patient presented with left wrist arthralgia and swelling and history of migrating monoarthritis. ID feels her clinical course of migrating monoarthritis, response to steroids, exquisite pain, and history of renal disease and hypertension make gout most likely. Her symptoms improved with a brief course of colchicine and now prednisone. PT saw her yesterday and recommended home health PT and a rolling walker, which have both been ordered. 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 Ceftriaxone is a known culprit (she got a dose on 12/31). Hydralazine has also been implicated, this is a home med for her. Her hemoglobin is now stable after receiving a unit  of PRBCs and starting high-dose steroids. She will need close followup in the internal medicine clinic for repeat CBC. - Continue prednisone 70mg  po daily (1mg /kg), day 3/7, after which we will do a long taper - Discontinue hydralazine (both scheduled and prn) - Ceftriaxone has been added to her allergy list   Hemoglobin  Date Value Range Status  06/02/2013 8.3* 12.0 - 15.0 g/dL Final  06/04/1094 8.5* 04.5 - 15.0 g/dL Final  4/0/9811 8.7* 91.4 - 15.0 g/dL Final     DELTA CHECK NOTED     REPEATED TO VERIFY  05/31/2013 6.9* 12.0 - 15.0 g/dL Final     CRITICAL VALUE NOTED.  VALUE IS CONSISTENT WITH PREVIOUSLY REPORTED AND CALLED VALUE.  05/31/2013 6.7* 12.0 - 15.0 g/dL Final     REPEATED TO VERIFY     CRITICAL RESULT CALLED TO, READ BACK BY AND VERIFIED WITH:     MAKRIS,L RN [id number] 05/31/13 LEONARD,A    #Low CO2 - Resolved, 24 today.  CO2  Date Value Range Status  06/02/2013 24  19 - 32 mEq/L Final  06/01/2013 17* 19 - 32 mEq/L Final  05/31/2013 17* 19 - 32 mEq/L Final  05/29/2013 19  19 - 32 mEq/L Final  05/28/2013 23  19 - 32 mEq/L Final  05/21/2013 19  19 - 32 mEq/L Final  05/14/2013 17* 19 - 32 mEq/L Final  05/13/2013 19  19 - 32 mEq/L Final  05/12/2013 19  19 - 32 mEq/L Final  05/11/2013 21  19 - 32 mEq/L Final    #Grade 2 diastolic dysfunction - Found on 2D echo 05/31/13. Likely 2/2 uncontrolled hypertension. Currently asymptomatic.  #Chest pain, resolved - Improved with Fentanyl IV x1 and her home Norco. Cycled troponins and negative x3. No EKG changes. Likely musculoskeletal vs. Anxiety. - Continue to monitor  #Seizure disorder - Home medications  include Keppra 1500 mg twice a day, phenytoin 100 mg 3 times a day, Ativan 1 mg daily. Dilantin level here was <2.5, suggesting noncompliance. However her Keppra level was supra therapeutic at 75.8 (normal range 5-30). Per pharmacy, no need to reduce dosage if mental status is baseline. - Continue home medications  #Hepatitis C genotype 1b - Poorly controlled. Very high HCV quant on 05/21/13. LFTs show elevated alkaline phosphatase (stable from prior) and elevated direct bilirubin (1.0). - Daily CMP  Hepatic Function Panel     Component Value Date/Time   PROT 5.8* 06/02/2013 0535   ALBUMIN 2.8* 06/02/2013 0535   AST 16 06/02/2013 0535   ALT 14 06/02/2013 0535   ALKPHOS 61 06/02/2013 0535   BILITOT 0.4 06/02/2013 0535   BILIDIR 1.0* 05/28/2013 1930   IBILI 0.3 05/28/2013 1930    #History of herpes simplex - Patient denies active lesions.  - Continue acyclovir 800 mg daily  #CKD stage 3 - Creatinine currently 1.6, which is her baseline.  - Continue to monitor   #HIV - Last CD4 490, VL <20 on 05/07/13. She established care with Dr. Drue Second on 05/21/13. 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 Dr. Drue Second saw her in consultation on 1/2 and recommended discontinuing Truvada and starting Epzicom (which is tenofovir sparing) due to worsening renal failure. - Continue Epzicom 600-300 mg daily, she will need a script for this when she leaves - Continue Kaletra 200-50 mg 2 tablets twice a day,  #History of syphilis - Labs from 05/07/13 show RPR reactive, titer 1:1, T pallidum antibodies >8.00.  - Per Dr. Feliz Beam note, she  was treated many years ago and there is no current need to treat.   #Diabetes - Well-controlled, most recent A1c was 5.0%.   #GERD - Protonix 40 mg daily.   #DVT PPX - Heparin subcutaneous.   Dispo: Disposition is deferred at this time, awaiting improvement of current medical problems.  Anticipated discharge in approximately 1-3 day(s).   The patient does have a current PCP  Christen Bame, MD) and does need an [facility] hospital follow-up appointment after discharge.  The patient does not have transportation limitations that hinder transportation to clinic appointments.  .Services Needed at time of discharge: Y = Yes, Blank = No PT:   OT:   RN:   Equipment:   Other:     LOS: 5 days   Vivi Barrack, MD 06/02/2013, 10:25 AM

## 2013-06-02 NOTE — Discharge Summary (Signed)
Name: Michelle [name] MRN: [id number] DOB: 10/28/1960 53 y.o. PCP: Clinton Gallant, MD  Date of Admission: 05/28/2013  9:16 AM Date of Discharge: 06/02/2013 Attending Physician: Karren Cobble, MD  Discharge Diagnosis: Principal Problem:   Left Wrist arthralgia Active Problems:   Unspecified essential hypertension   CKD (chronic kidney disease)   HIV disease   CKD (chronic kidney disease) stage 3, GFR 30-59 ml/min   Diabetes   Hypertensive emergency   Hypertensive urgency   Encephalopathy, hypertensive   Autoimmune hemolytic anemia   Gout flare   Pulmonary hypertension   Diastolic dysfunction-grade 2   Unspecified protein-calorie malnutrition  Discharge Medications:   Medication List    STOP taking these medications       emtricitabine-tenofovir 200-300 MG per tablet  Commonly known as:  TRUVADA     hydrALAZINE 50 MG tablet  Commonly known as:  APRESOLINE      TAKE these medications       abacavir-lamiVUDine 600-300 MG per tablet  Commonly known as:  EPZICOM  Take 1 tablet by mouth daily.     ACCU-CHEK AVIVA PLUS W/DEVICE Kit  USE AS DIRECTED     acyclovir 800 MG tablet  Commonly known as:  ZOVIRAX  Take 1 tablet (800 mg total) by mouth daily.     aspirin EC 81 MG tablet  Take 1 tablet (81 mg total) by mouth daily.     cholecalciferol 1000 UNITS tablet  Commonly known as:  VITAMIN D  Take 1,000 Units by mouth daily.     cloNIDine 0.2 MG tablet  Commonly known as:  CATAPRES  Take 1 tablet (0.2 mg total) by mouth 2 (two) times daily.     furosemide 40 MG tablet  Commonly known as:  LASIX  Take 1 tablet (40 mg total) by mouth daily.     HYDROcodone-acetaminophen 10-325 MG per tablet  Commonly known as:  NORCO  Take 1 tablet by mouth every 6 (six) hours as needed.     labetalol 100 MG tablet  Commonly known as:  NORMODYNE  Take 1 tablet (100 mg total) by mouth 2 (two) times daily.     levETIRAcetam 500 MG tablet  Commonly known as:  KEPPRA  Take 3  tablets (1,500 mg total) by mouth every 12 (twelve) hours.     lisinopril 40 MG tablet  Commonly known as:  PRINIVIL,ZESTRIL  Take 1 tablet (40 mg total) by mouth daily.     lopinavir-ritonavir 200-50 MG per tablet  Commonly known as:  KALETRA  Take 2 tablets by mouth 2 (two) times daily.     LORazepam 1 MG tablet  Commonly known as:  ATIVAN  Take 1 mg by mouth daily. Take one tablet for seizure prevention.     methocarbamol 500 MG tablet  Commonly known as:  ROBAXIN  Take 1,000 mg by mouth 2 (two) times daily. Take two 500 mg tablets by mouth four times daily     pantoprazole 40 MG tablet  Commonly known as:  PROTONIX  Take 1 tablet (40 mg total) by mouth daily.     phenytoin 50 MG tablet  Commonly known as:  DILANTIN  Chew 2 tablets (100 mg total) by mouth 3 (three) times daily.     predniSONE 20 MG tablet  Commonly known as:  DELTASONE  Take 3 tablets (60 mg total) by mouth daily with breakfast.     triamcinolone cream 0.1 %  Commonly known as:  KENALOG  Apply  1 application topically 2 (two) times daily as needed. For rash.        Disposition and follow-up:   Michelle [name] [facility] St[facility].  At the hospital follow up visit please address:  1.  This patient has autoimmune hemolytic anemia. Please check a CBC and follow the instructions for steroid taper below. She will need regular follow up in the clinic over the next few months to guide the taper. At discharge, I gave her a prescription for 38m/d x 20 days, but she will likely be able to taper before this if her Hgb remains stable. Here in the hospital, she got 758md x 3 days. Page me if you have question ([phone]).  Please also follow up on BP control and medication compliance (this is her second admission for HTN emergency in a month). I personally reviewed her medicines with her and reordered them at a new pharmacy closer to her house ([facility] [place] [address] so  hopefully her compliance will be better this time around.  ___________________________________________________  Instructions for Steroid Taper for AIHA (from UpToDate):  - After a sufficient hemoglobin concentration (usually >10 g/dL, however this patient may not achieve this 2/2 chronic anemia) has been achieved, maintain the prednisone dose at 60 mg/day for one week.  - Rapidly taper the dose to 20 mg/day over a period of two weeks. This is the largest dose that will be tolerable over time. Maintain this dose for one month.  - If remission persists, further reduce the dosing to 20 mg/day alternating with 10 mg/day. Maintain this regimen for one month.  - If remission persists, omit the dose on alternate days while maintaining the dose at 20 mg every other day.  - If remission persists, reduce the dose to 10 mg/day on alternate days. Tapering of glucocorticoids should be continued as long as the hemoglobin and haptoglobin levels remain improved and stable, lactate dehydrogenase levels stay low, and the absolute reticulocyte count remains below 100,000/microL.  - Glucocorticoids can be stopped when there is normalization of the hemoglobin, haptoglobin, LDH, and absolute reticulocyte count, although the Coombs test may remain positive. The patient should be monitored for recurrence for a number of months following cessation of treatment. ___________________________________________________  2.  Labs / imaging needed at time of follow-up: CBC, BMP  3.  Pending labs/ test needing follow-up: Multiple cultures (synvoial fluid, blood, urine) are NGTD  Follow-up Appointments: Follow-up Information   Follow up with SaClinton GallantMD In 1 week. (The clinic will call you with your appointment tomorrow.)    Specialty:  Internal Medicine   Contact information:   [address] [place] [phone]     Follow up with SNCarlyle BasquesMD On 06/07/2013. (_0 :30 AM. This is your ID  doctor.)    Specialty:  Infectious Diseases   Contact information:   [address] [address] [phone]     Follow up with SUJim LikeJULockneyDO On 07/02/2013. (_1 :30 AM. This is your neurologist.)    Specialty:  Neurology   Contact information:   [address] [phone]     Discharge Instructions: Discharge Orders   Future Appointments Provider Department Dept Phone   06/07/2013 10:30 AM [facility] [phone] 07/02/2013 11:30 AM PeHulen LusterDO [facility] [phone] 07/09/2013  9:30 AM Carlyle Basques, MD [facility] for Infectious Disease [phone]   Future Orders Complete By Expires   Diet - low sodium heart healthy  As directed    Increase activity slowly  As directed       Consultations:  ID  Procedures Performed:  Dg Wrist Complete Left  05/28/2013   CLINICAL DATA:  Pain and swelling.  Limited range of motion.  EXAM: LEFT WRIST - COMPLETE 3+ VIEW  COMPARISON:  None.  FINDINGS: The bones appear somewhat osteopenic. There is no evidence of fracture, dislocation, degenerative change or other focal finding.  IMPRESSION: Question osteopenia.  Otherwise normal radiographs.   Electronically Signed   By: Nelson Chimes M.D.   On: 05/28/2013 10:33   Ct Head Wo Contrast  05/28/2013   CLINICAL DATA:  Back and arm pain  EXAM: CT HEAD WITHOUT CONTRAST  TECHNIQUE: Contiguous axial images were obtained from the base of the skull through the vertex without intravenous contrast.  COMPARISON:  02/14/2013  FINDINGS: Skull and Sinuses:No significant abnormality.  Orbits: No acute abnormality.  Brain: No evidence of acute abnormality, such as acute infarction, hemorrhage, hydrocephalus, or mass lesion/mass effect. Generalized atrophy, age advanced. Stable pattern of chronic small vessel ischemic injury, with  periventricular white matter low attenuation.  IMPRESSION: 1. No evidence of acute intracranial disease. 2. Generalized brain atrophy.   Electronically Signed   By: Jorje Guild M.D.   On: 05/28/2013 17:13   Ct Head Wo Contrast  05/10/2013   CLINICAL DATA:  Headache and confusion.  EXAM: CT HEAD WITHOUT CONTRAST  TECHNIQUE: Contiguous axial images were obtained from the base of the skull through the vertex without intravenous contrast.  COMPARISON:  02/14/2013  FINDINGS: Prominence of the ventricles and sulci is compatible with mild generalized cerebral atrophy, advanced for age. Periventricular white matter hypodensities are compatible with mild chronic small vessel ischemic disease, unchanged. There is no evidence of acute cortical infarct, mass, midline shift, intracranial hemorrhage, or extra-axial fluid collection. Orbits are unremarkable. Visualized paranasal sinuses and mastoid air cells are clear. No evidence of acute fracture.  IMPRESSION: No evidence of acute intracranial abnormality.   Electronically Signed   By: Logan Bores   On: 05/10/2013 11:33   Mr Brain Wo Contrast  05/10/2013   CLINICAL DATA:  Altered mental status.  HIV  EXAM: MRI HEAD WITHOUT CONTRAST  TECHNIQUE: Multiplanar, multiecho pulse sequences of the brain and surrounding structures were obtained without intravenous contrast.  COMPARISON:  CT head 05/10/2013  FINDINGS: Moderate atrophy. Mild hyperintensity in the periventricular white matter bilaterally. Mild hyperintensity in the pons bilaterally.  Negative for acute infarct. Negative for hemorrhage or fluid collection negative for mass or edema.  Paranasal sinuses are clear.  IMPRESSION: Generalized atrophy. Mild chronic changes in the white matter and pons. No acute abnormality.   Electronically Signed   By: Franchot Gallo M.D.   On: 05/10/2013 19:19   Dg Hand 2 View Right  05/14/2013   CLINICAL DATA:  Pain in right hand.  No known injury.  EXAM: RIGHT HAND - 2 VIEW   COMPARISON:  None  FINDINGS: Mild bony prominence from the radial base of the proximal phalanx of the thumb. This may reflect mild degenerative spurring or be related to remote trauma.  Remaining joints are normally space and aligned. There are no erosions.  No focal soft tissue swelling, but mild diffuse swelling is suggested. There is no soft tissue ossification or calcification.  IMPRESSION: No  fracture or acute finding.  No erosions to suggest gout or other erosive arthropathy.  Mild nonspecific soft tissue swelling.   Electronically Signed   By: Lajean Manes M.D.   On: 05/14/2013 16:27   Portable Chest 1 View  05/10/2013   CLINICAL DATA:  Altered mental status  EXAM: PORTABLE CHEST - 1 VIEW  COMPARISON:  None.  FINDINGS: Cardiac shadow is within normal limits given the portable technique. The lungs are clear with exception of minimal platelike atelectasis in the left lateral lung base. No focal confluent infiltrate is seen. No bony abnormality is noted.  IMPRESSION: Minimal left basilar atelectasis.   Electronically Signed   By: Inez Catalina M.D.   On: 05/10/2013 17:36    2D Echo:  ------------------------------------------------------------ Study Conclusions 05/31/13  - Left ventricle: The cavity size was normal. There was mild concentric hypertrophy. Systolic function was normal. Wall motion was normal; there were no regional wall motion abnormalities. Features are consistent with a pseudonormal left ventricular filling pattern, with concomitant abnormal relaxation and increased filling pressure (grade 2 diastolic dysfunction). Doppler parameters are consistent with elevated ventricular end-diastolic filling pressure. - Aortic valve: There is focal thickening of the tip of the left coronary leaflet of the aortic valve. Valve area: 2.05cm^2(VTI). Valve area: 2.03cm^2 (Vmax). - Mitral valve: Mild regurgitation. - Left atrium: The atrium was moderately dilated. - Right ventricle:  Systolic pressure was increased. - Tricuspid valve: Mild regurgitation. - Pulmonary arteries: PA peak pressure: 80m Hg (S). Transthoracic echocardiography. M-mode, complete 2D, spectral Doppler, and color Doppler. Height: Height: 170.2cm. Height: 67in. Weight: Weight: 68.2kg. Weight: 150lb. Body mass index: BMI: 23.5kg/m^2. Body surface area: BSA: 1.885m. Blood pressure: 124/74. Patient status: Inpatient. Location: Bedside.  ------------------------------------------------------------   Admission HPI:  Michelle [name] a [age].o. woman PMH of HTN with recent admission for HTN emergency, CKD stage 3 (baseline Cr ~1.7), well controlled DM2 (last A1C 5.0 on 05/14/13), well controlled HIV (last CD4 490, VL <20 on 05/07/13), HCV, seizure disorder, migraines, DM2, herpes simplex, gout, who presents to the ED with left wrist pain.   She has a history of gout in her ankles, treated successfully with colchicine in the past. She was recently admitted to the [facility] from 12/12-12/18/14 for hypertension emergency presenting as encephalopathy, and at that time complained of left wrist pain and swelling. She was given a five-day course of prednisone 20 mg per day, which led to marked improvement in her symptoms. X-Mcpartland of the RIGHT wrist was performed and negative for erosions or fractures. (Some documentation says the pain was in her right wrist, other notes - including the discharge summary - report left wrist pain.) Patient says her pain was "moving around." Given her chronic renal failure, colchicine and NSAIDs were discontinued upon discharge. Patient was instructed to follow up with orthopedics for joint aspiration of pain recurred.   Today she came to the ED because her pain is worse, 7/10, and has been ongoing for 2 weeks. The pain is intermittent. Nothing really makes it better or worse. She has not tried taking any pain medicine at home. She says she cannot move her hand, wrist, fingers, or elbow. She is  also complaining of left-sided back pain. She denies abdominal pain, nausea, vomiting, diarrhea, chest pain, shortness of breath, dysuria. She denies rash, vaginal discharge. [place] other travel, [place]. No significant outdoor activity. No history of tick bites.   She stopped  smoking 1.5 years ago. She does not use alcohol regularly and her last drink was 6 months ago. She denies street drugs but admits to using them years ago. She is not sexually active. She last had intercourse 3 years ago. She does admit to a history many years ago of gonorrhea, chlamydia, and herpes in addition to her known history of syphilis, HIV.   In the ED her left wrist was tapped yielding about 1cc of fluid.  Physical Exam:  Blood pressure 181/81, pulse 100, temperature 99.1 F (37.3 C), temperature source Rectal, resp. rate 21, height 5' 5.35" (1.66 m), weight 198 lb 6.6 oz (90 kg), SpO2 97.00%.  Physical Exam  Constitutional: She is oriented to person, place, and time and well-developed, well-nourished, and in no distress.  HENT:  Head: Normocephalic and atraumatic.  Eyes: Conjunctivae and EOM are normal. Pupils are equal, round, and reactive to light.  Neck: Normal range of motion. Neck supple.  Cardiovascular: Normal rate, regular rhythm, normal heart sounds and intact distal pulses. Exam reveals no gallop and no friction rub.  No murmur heard.  Pulmonary/Chest: Effort normal and breath sounds normal.  Abdominal: Soft. She exhibits no distension. There is no tenderness.  Musculoskeletal:  Right wrist: Normal. She exhibits normal range of motion, no tenderness and no swelling.  Left wrist: She exhibits decreased range of motion (Patient will not perform range of motion exam for wrist, fingers, elbow, or shoulder. Moves fingers when encouraged.), tenderness (No pain with direct palaption when distracted, but significant pain and grimacing when directed.) and  swelling.  Right ankle: Normal. She exhibits normal range of motion and no swelling. No tenderness.  Left ankle: Normal. She exhibits normal range of motion and no swelling. No tenderness.  Pulses intact.  Neurological: She is alert and oriented to person, place, and time. No cranial nerve deficit. GCS score is 15.  Skin: Skin is warm and dry. No rash noted.    Psychiatric: Affect normal.  Difficult historian, some delayed and inappropriate answers.    Hospital Course by problem list: Michelle [name] is a 53 y.o. woman PMH of HTN with recent admission for HTN emergency, CKD stage 3 (baseline Cr ~1.7), well controlled DM2 (last A1C 5.0 on 05/14/13), well controlled HIV (last CD4 490, VL <20 on 05/07/13), HCV, seizure disorder, migraines, DM2, herpes simplex, gout, who presents to the ED with left wrist pain and then later developed altered mental status.   1. Hypertensive emergency with encephalopathy - Patient was transferred to the ICU on the afternoon of admission, 05/28/13, after becoming altered and confused. BP was noted to be 217/103. Head CT was negative for an acute bleed. She has history of hypertension emergency causing encephalopathy secondary to medication noncompliance. She was placed on a nicardipine drip; BP was controlled and drip was discontinued on 12/31, at which point the patient was transferred out of the ICU. BP afterwards was decently controlled on home medications (Lasix 40 mg daily, labetalol 100 mg twice a day, lisinopril 40 mg daily, clonidine 0.2 mg twice a day) and mental status was baseline. We feel that clonidine is likely not the best choice in this noncompliant patient, given the possibility of rebound hypertension. However, our options are limited as she is allergic to amlodipine and we had to discontinue her home hydralazine 50 mg 3 times a day secondary to autoimmune hemolytic anemia (see below). Patient was counseled extensively (by me) on how and when to take her  multiple blood pressure medications  prior to discharge. Patient expressed understanding. We will have her followup in clinic closely for BP check and to ensure medication compliance. We have also arranged for her to get a home health nurse to assist with medication understanding and compliance.  2. Left wrist arthralgia with fever - Patient presented with left wrist arthralgia and swelling and history of migrating monoarthritis. Patient has a history of gout and was recently taken off her colchicine due to chronic renal failure. Synovial fluid analysis here was negative for crystals, but per ID the specimen clotted which makes this analysis less useful. ID feels her clinical course of migrating monoarthritis, response to steroids, exquisite pain, and history of renal disease and hypertension make gout the most likely culprit. ID did not suspect septic arthritis since this has affected multiple joints. We discontinued antibiotics (she got one day of IV Vanc/Ceftrixone). Culture of synovial fluid is NGTD. She spiked fevers during her day the ICU for HTN emergency, but these resolved after stopping Vanc/Ceftriaxone thus could have represented a drug fever. Urine PCR probe for GC/chlamydia was negative, and she had no skin lesions and no reported recent sexual contacts. Gonococcus culture of throat and synovial fluid showed no growth. She had not been in a Lyme endemic area, so her positive B burdorferi Ab is not helpful/ suggestive of disease per ID. Flu panel was negative. UA with trace leukocytes and few bacteria, she is asymptomatic, urine culture with no growth. 2D echo was checked as patient had a mild systolic murmur. It was notable for a focal thickening of the tip of the left coronary leaflet of the aortic valve, no regurgitation. This likely represents a calcification. Will did NOT start antibiotics or pursue TEE. Multiple blood cultures are NGTD. Her wrist pain improved with a brief course of colchicine and  now steroids. PT saw her and recommended home health PT and a rolling walker, which were ordered. Patient was discharged on prednisone (see below) and a short course of Norco 10-325 every 4 hours when necessary.   3. Autoimmune hemolytic anemia with warm agglutinins - On 1/2 her Hgb dropped from 8.5 to 6.7, repeat was 6.9. She was asymptomatic. We suspected hemolysis as she had a bilirubinemia (1.3) and no active bleeding. Labs revealed LDH 309 (high), and a positive Coombs test for IgG and complement. Warm agglutinins were identified. Other labs: Reticulocyte count 39.3 (wnl), Haptoglobin 94 (wnl), technologist smear unremarkable. 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 Ceftriaxone is a known culprit (she got a dose on 12/31). Hydralazine has also been implicated, and she was taking 23m po TID at home; seems less likely, but then again compliance is uncertain. We started high dose steroids (168mkg/d) and transfused 1U PRBC, with a more-than-appropriate hemoglobin response to 8.7. This was stable over the next day as below. Of note, she does have a baseline macrocytic anemia. Baseline hemoglobin 8.5-9.0; MCV 100-102. Iron panel from 12/16 showed iron 40 (slightly low), TIBC 251 (wnl), Sat ratio 16 (low). B12 488 (wnl) and folate 10.4 (wnl). Chronic etiologies include HIV meds, HCV, CKD. She will need close followup in the internal medicine clinic for repeat CBC and to guide prednisone taper (see follow up instructions above). We stopped hydralazine (both scheduled and prn) and added Ceftriaxone to her allergy list.  Hemoglobin   Date  Value  Range  Status   06/02/2013  8.3*  12.0 - 15.0 g/dL  Final   06/01/2013  8.5*  12.0 - 15.0 g/dL  Final   06/01/2013  8.7*  12.0 - 15.0 g/dL  Final   DELTA CHECK NOTED   REPEATED TO VERIFY   05/31/2013  6.9*  12.0 - 15.0 g/dL  Final   CRITICAL VALUE NOTED. VALUE IS CONSISTENT WITH PREVIOUSLY REPORTED AND CALLED VALUE.   05/31/2013  6.7*  12.0 - 15.0 g/dL  Final   REPEATED TO VERIFY     CRITICAL RESULT CALLED TO, READ BACK BY AND VERIFIED WITH:   MAKRIS,L RN [id number] 05/31/13 LEONARD,A    4. Low CO2 - Patient had several low CO2s while she was here. This is mostly chronic, baseline is ~19-21. Likely 2/2 CKD. She may need alkali therapy to maintain the serum bicarbonate concentration in the normal range (sodium bicarbonate 0.5 to 1 meq/kg per day) but we will defer this to the outpatient setting.   CO2   Date  Value  Range  Status   06/02/2013  24  19 - 32 mEq/L  Final   06/01/2013  17*  19 - 32 mEq/L  Final   05/31/2013  17*  19 - 32 mEq/L  Final   05/29/2013  19  19 - 32 mEq/L  Final   05/28/2013  23  19 - 32 mEq/L  Final   05/21/2013  19  19 - 32 mEq/L  Final   05/14/2013  17*  19 - 32 mEq/L  Final   05/13/2013  19  19 - 32 mEq/L  Final   05/12/2013  19  19 - 32 mEq/L  Final   05/11/2013  21  19 - 32 mEq/L  Final    5. Grade 2 diastolic dysfunction - Found on 2D echo 05/31/13. Likely 2/2 uncontrolled hypertension. Currently asymptomatic. Follow up as an outpatient.  6. Chest pain, resolved - Patient reported some sharp, nonradiating chest pain under her left breast on 1/1. Pain was not positional. EKG reviewed and unchanged from prior, no concerning ST/T wave changes. She refused nitroglycerin because she feared it would cause a headache. Pain improved with Fentanyl 91mg IV x1 and her home Norco. Cycled troponins and negative x3. Thought to be likely musculoskeletal vs. Anxiety.   7. Seizure disorder - Home medications include Keppra 1500 mg twice a day, phenytoin 100 mg 3 times a day, Ativan 1 mg daily. Dilantin level here was <2.5, suggesting noncompliance. However her Keppra level was supra therapeutic at 75.8 (normal range 5-30). Please assess medication compliance in the outpatient setting.  8. Hepatitis C genotype 1b - Poorly controlled. Very high HCV quant on 05/21/13. LFTs as below. She will follow up closely in ID clinic.  Hepatic Function Panel    Component  Value   Date/Time    PROT  5.8*  06/02/2013 0535    ALBUMIN  2.8*  06/02/2013 0535    AST  16  06/02/2013 0535    ALT  14  06/02/2013 0535    ALKPHOS  61  06/02/2013 0535    BILITOT  0.4  06/02/2013 0535    BILIDIR  1.0*  05/28/2013 1930    IBILI  0.3  05/28/2013 1930    9. History of herpes simplex - Patient denies active lesions. We continued acyclovir 800 mg daily.  10. CKD stage 3 - Creatinine stable here. Baseline reportedly ~1.6. See trend below.  Creatinine, Ser  Date Value Range Status  06/02/2013 0.90  0.50 - 1.10 mg/dL Final     DELTA CHECK NOTED  06/01/2013 1.98* 0.50 - 1.10 mg/dL Final  05/31/2013 2.25* 0.50 - 1.10 mg/dL  Final  05/29/2013 1.55* 0.50 - 1.10 mg/dL Final  05/28/2013 1.61* 0.50 - 1.10 mg/dL Final    11. HIV - Last CD4 490, VL <20 on 05/07/13. She established care with Dr. Baxter Flattery on 05/21/13. 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 Dr. Baxter Flattery saw her in consultation on 1/2 and recommended discontinuing Truvada and starting Epzicom (which is tenofovir sparing) due to worsening renal failure. We provided her with a prescription for Epzicom 600-300 mg daily, as this is a new medication. She will continue Kaletra 200-50 mg 2 tablets twice a day. Patient was counseled by me personally on these changes.   12. History of syphilis - Labs from 05/07/13 show RPR reactive, titer 1:1, T pallidum antibodies >8.00. Per Dr. Storm Frisk note, she was treated many years ago and there is no current need to treat.   13. Diabetes - Well-controlled, most recent A1c was 5.0%.     Discharge Vitals:   BP 162/87  Pulse 69  Temp(Src) 97.9 F (36.6 C) (Oral)  Resp 18  Ht _0  (1.702 m)  Wt 183 lb 3.2 oz (83.099 kg)  BMI 28.69 kg/m2  SpO2 100%  Discharge Labs:  Results for orders placed during the hospital encounter of 05/28/13 (from the past 24 hour(s))  CBC     Status: Abnormal   Collection Time    06/01/13  2:20 PM      Result Value Range   WBC 5.0  4.0 - 10.5 K/uL   RBC 2.56 (*) 3.87 -  5.11 MIL/uL   Hemoglobin 8.5 (*) 12.0 - 15.0 g/dL   HCT 24.5 (*) 36.0 - 46.0 %   MCV 95.7  78.0 - 100.0 fL   MCH 33.2  26.0 - 34.0 pg   MCHC 34.7  30.0 - 36.0 g/dL   RDW 14.9  11.5 - 15.5 %   Platelets 232  150 - 400 K/uL  CBC     Status: Abnormal   Collection Time    06/02/13  5:35 AM      Result Value Range   WBC 6.5  4.0 - 10.5 K/uL   RBC 2.55 (*) 3.87 - 5.11 MIL/uL   Hemoglobin 8.3 (*) 12.0 - 15.0 g/dL   HCT 24.6 (*) 36.0 - 46.0 %   MCV 96.5  78.0 - 100.0 fL   MCH 32.5  26.0 - 34.0 pg   MCHC 33.7  30.0 - 36.0 g/dL   RDW 14.6  11.5 - 15.5 %   Platelets 268  150 - 400 K/uL  COMPREHENSIVE METABOLIC PANEL     Status: Abnormal   Collection Time    06/02/13  5:35 AM      Result Value Range   Sodium 143  137 - 147 mEq/L   Potassium 4.2  3.7 - 5.3 mEq/L   Chloride 110  96 - 112 mEq/L   CO2 24  19 - 32 mEq/L   Glucose, Bld 87  70 - 99 mg/dL   BUN 8  6 - 23 mg/dL   Creatinine, Ser 0.90  0.50 - 1.10 mg/dL   Calcium 8.6  8.4 - 10.5 mg/dL   Total Protein 5.8 (*) 6.0 - 8.3 g/dL   Albumin 2.8 (*) 3.5 - 5.2 g/dL   AST 16  0 - 37 U/L   ALT 14  0 - 35 U/L   Alkaline Phosphatase 61  39 - 117 U/L   Total Bilirubin 0.4  0.3 - 1.2 mg/dL   GFR calc non  Af Amer 71 (*) >90 mL/min   GFR calc Af Amer 83 (*) >90 mL/min    Signed: Lesly Dukes, MD 06/02/2013, 12:49 PM   Time Spent on Discharge: 50 minutes Services Ordered on Discharge: Home health PT and RN Equipment Ordered on Discharge: Rolling walker

## 2013-06-02 NOTE — Discharge Instructions (Signed)
It was a pleasure taking care of you. - Please take prednisone 60mg  (3 tabs) every morning for the next 20 days. This is for your hemolytic anemia (low blood counts).  - It is very important that you followup with your primary care doctor in the clinic next week. She will check your blood levels and taper your steroids based on the results. - Please follow up with Dr. Drue Second on 1/9 for your HIV.  - I have ordered your new HIV medication, Epzicom. Please stop taking Truvada because it can worsen your kidney disease. - I have ordered refills for all of your chronic medicines including medicines for HIV, blood pressure, and seizure prevention. They were sent electronically to your pharmacy. If you have ANY trouble getting these medicines, please call the clinic at [phone] as soon as possible. - I reviewed all of your medicines with you before you left the hospital. - I have ordered home health PT and nursing to help you with your medical problems and keeping track of your medicines. - If you develop severe headache, joint pain, fever, confusion please call the clinic at [phone] or return to the ED.

## 2013-06-02 NOTE — Progress Notes (Signed)
   CARE MANAGEMENT NOTE 06/02/2013  Patient:  KARSON, SLATER   Account Number:  [id number]  Date Initiated:  05/29/2013  Documentation initiated by:  Junius Creamer  Subjective/Objective Assessment:   adm w htn emergency     Action/Plan:   lives w fam, pcp dr Arna Medici sadek   Anticipated DC Date:  06/02/2013   Anticipated DC Plan:  HOME W HOME HEALTH SERVICES      DC Planning Services  CM consult      [facility]  HOME HEALTH   Choice offered to / List presented to:  C-1 Patient   DME arranged  [facility]      DME agency  [facility]     [facility] arranged  HH-1 RN  HH-2 PT      Status of service:  Completed, signed off Medicare Important Message given?   (If response is "NO", the following Medicare IM given date fields will be blank) Date Medicare IM given:   Date Additional Medicare IM given:    Discharge Disposition:  HOME W HOME HEALTH SERVICES  Per UR Regulation:  Reviewed for med. necessity/level of care/duration of stay  If discussed at Long Length of Stay Meetings, dates discussed:    Comments:  06/02/13 CM spoke with pt to offer choice and pt chose [facility] for H[facility]/PT.  Address and contact numbers were verified. Referral faxed to [facility].  RW to be delivered to room prior to discharge.  No other CM needs were communicated.  Freddy Jaksch, BSN, CM [phone].

## 2013-06-03 LAB — CULTURE, BLOOD (ROUTINE X 2)
Culture: NO GROWTH
Culture: NO GROWTH

## 2013-06-05 LAB — TYPE AND SCREEN
ABO/RH(D): O POS
Antibody Screen: POSITIVE
UNIT DIVISION: 0
Unit division: 0

## 2013-06-05 LAB — CULTURE, BLOOD (SINGLE): Culture: NO GROWTH

## 2013-06-05 LAB — CULTURE, BLOOD (ROUTINE X 2)
Culture: NO GROWTH
Culture: NO GROWTH

## 2013-06-06 ENCOUNTER — Telehealth: Payer: Self-pay | Admitting: *Deleted

## 2013-06-06 NOTE — Telephone Encounter (Signed)
Confirmed upcoming appointments w pt. Andree Coss, RN

## 2013-06-07 ENCOUNTER — Ambulatory Visit: Payer: Medicaid Other

## 2013-06-07 ENCOUNTER — Telehealth: Payer: Self-pay | Admitting: *Deleted

## 2013-06-07 NOTE — Telephone Encounter (Signed)
Requested pt call for new appt. 

## 2013-06-11 ENCOUNTER — Ambulatory Visit (INDEPENDENT_AMBULATORY_CARE_PROVIDER_SITE_OTHER): Payer: Medicaid Other | Admitting: Internal Medicine

## 2013-06-11 ENCOUNTER — Encounter: Payer: Self-pay | Admitting: Internal Medicine

## 2013-06-11 VITALS — BP 184/80 | HR 68 | Temp 97.7°F | Ht 65.5 in | Wt 204.9 lb

## 2013-06-11 DIAGNOSIS — I10 Essential (primary) hypertension: Secondary | ICD-10-CM

## 2013-06-11 DIAGNOSIS — D591 Autoimmune hemolytic anemia, unspecified: Secondary | ICD-10-CM

## 2013-06-11 DIAGNOSIS — R609 Edema, unspecified: Secondary | ICD-10-CM

## 2013-06-11 DIAGNOSIS — E119 Type 2 diabetes mellitus without complications: Secondary | ICD-10-CM

## 2013-06-11 DIAGNOSIS — R600 Localized edema: Secondary | ICD-10-CM

## 2013-06-11 DIAGNOSIS — Z09 Encounter for follow-up examination after completed treatment for conditions other than malignant neoplasm: Secondary | ICD-10-CM

## 2013-06-11 LAB — BASIC METABOLIC PANEL
BUN: 63 mg/dL — ABNORMAL HIGH (ref 6–23)
CALCIUM: 7.8 mg/dL — AB (ref 8.4–10.5)
CHLORIDE: 106 meq/L (ref 96–112)
CO2: 15 meq/L — AB (ref 19–32)
Creat: 2.02 mg/dL — ABNORMAL HIGH (ref 0.50–1.10)
Glucose, Bld: 250 mg/dL — ABNORMAL HIGH (ref 70–99)
Potassium: 4.4 mEq/L (ref 3.5–5.3)
SODIUM: 134 meq/L — AB (ref 135–145)

## 2013-06-11 LAB — CBC
HCT: 25.4 % — ABNORMAL LOW (ref 36.0–46.0)
Hemoglobin: 9 g/dL — ABNORMAL LOW (ref 12.0–15.0)
MCH: 35 pg — ABNORMAL HIGH (ref 26.0–34.0)
MCHC: 35.4 g/dL (ref 30.0–36.0)
MCV: 98.8 fL (ref 78.0–100.0)
Platelets: 349 10*3/uL (ref 150–400)
RBC: 2.57 MIL/uL — AB (ref 3.87–5.11)
RDW: 16.4 % — ABNORMAL HIGH (ref 11.5–15.5)
WBC: 8.9 10*3/uL (ref 4.0–10.5)

## 2013-06-11 LAB — GLUCOSE, CAPILLARY: Glucose-Capillary: 241 mg/dL — ABNORMAL HIGH (ref 70–99)

## 2013-06-11 MED ORDER — METHOCARBAMOL 500 MG PO TABS: 1000.0000 mg | ORAL_TABLET | Freq: Two times a day (BID) | ORAL | Status: DC

## 2013-06-11 MED ORDER — HYDROCODONE-ACETAMINOPHEN 10-325 MG PO TABS: 1.0000 | ORAL_TABLET | Freq: Four times a day (QID) | ORAL | Status: DC | PRN

## 2013-06-11 NOTE — Progress Notes (Signed)
   Subjective:    Patient ID: Michelle [name], female    DOB: May 03, 1961, 53 y.o.   MRN: [id number]  HPI Michelle [name] is a 53 yo woman pmh as listed below presenting for hospital follow up.   Pt was d/c on 06/02/13 for a complicated medical course for HTN emergency complicated by wrist infection and AIHA. Pt discharged BP was 180/80s after being admitted with BP in 210s/110s. Since that time pt has reported compliance with BP meds and steriods. Feeling better. Having weight gain and significant LE edema that prevents ambulation. She continues to have DOE with slight movement around the house. Pt makes references to devil coming to "posses her and making her sick but she won the fight." she still having some intermittent CP that is sternal in nature and relieved by holding her breath it is not associated with any diaphoresis, HA, dizziness, or blurry vision. She doesn't complian of HA anymore and doesn't remember having any seizures.   Since starting the prednisone the patient has increased appetite and has been eating a lot more. She lives alone and now has a cousin whom checks on her daily.   In terms of her DM since being on the steroids have caused some increase. She brings in a self written log that shows CBGs in the 150-300s. She had her insulin d/c on her 05/22/13 discharge given concern for hypoglycemia and her HgbA1c of 5.0. Therefore she has been managing with just diet control.    Review of Systems  Constitutional: Positive for appetite change (increase) and unexpected weight change (large weight gain especially in feet ). Negative for fever, chills, diaphoresis, activity change and fatigue.  HENT: Negative for congestion, nosebleeds and postnasal drip.   Eyes: Negative for visual disturbance.  Respiratory: Positive for shortness of breath. Negative for cough and chest tightness.   Cardiovascular: Positive for chest pain (intermittent) and leg swelling (dramatically worse over past 3 days). Negative  for palpitations.  Gastrointestinal: Negative for nausea, vomiting, abdominal pain, diarrhea, constipation and abdominal distention.  Endocrine: Negative for polydipsia and polyuria.  Genitourinary: Negative for difficulty urinating.  Neurological: Negative for dizziness, seizures, syncope, facial asymmetry, speech difficulty, weakness, light-headedness and headaches.  Psychiatric/Behavioral: Positive for confusion.    Past Medical History  Diagnosis Date  . Seizures   . Stroke   . Meningitis   . Diabetes mellitus without complication   . HIV (human immunodeficiency virus infection)   . Hypertension   . Gout   . Muscle spasms of head and/or neck   . CKD (chronic kidney disease)    Social, surgical, family history reviewed with patient and updated in appropriate chart locations.      Objective:   Physical Exam Filed Vitals:   06/11/13 1432  BP: 184/80  Pulse: 68  Temp: 97.7 F (36.5 C)   General: sitting in wheelchair, NAD HEENT: PERRL, EOMI, no scleral icterus Cardiac: RRR, no rubs, murmurs or gallops Pulm: clear to auscultation bilaterally, no crackles, wheezes or rhonchi, moving normal volumes of air Abd: soft, nontender, nondistended, BS present Ext: warm and well perfused,3+ pedal edema to knees bilaterally Neuro: alert and oriented X3, cranial nerves II-XII grossly intact Psych: making references to devil possessing patient throughout exam, slight inattention      Assessment & Plan:  Please see problem oriented charting  Pt discussed with Dr. Josem Kaufmann

## 2013-06-11 NOTE — Patient Instructions (Signed)
For your blood pressure:  -We need to wait for your blood test to come back before we make any changes -We will see you back Friday  For your swelling:  -Increase your Lasix from 40mg  daily to twice a day until we follow up with you Friday or Monday   For your steroids - I will call you with results of your lab results and then we will decrease your steriods

## 2013-06-12 ENCOUNTER — Telehealth: Payer: Self-pay | Admitting: Internal Medicine

## 2013-06-12 DIAGNOSIS — R600 Localized edema: Secondary | ICD-10-CM | POA: Insufficient documentation

## 2013-06-12 NOTE — Assessment & Plan Note (Signed)
Pt is having very hard to control HTN in setting of poor medical literacy and unadherence to medication. Pt has had several recent admissions for HTN emergency requiring ICU admission and retitration of medications. Most recent d/c BP was 180s/80s. Pt is on many maximal therapy and developed presumed serious allergy to amlodipine (AIHA). -greatly benefit from [facility] or [facility]  -continue current medications: clonidine 0.2mg  bid, labetalol 100mg  BID, lisinopril 40mg  QD -increased lasix to 40mg  BID in setting of LE edema in setting of large dose steriods will need to reasses with taper -BP log to bring in within 1 wk for re-eval

## 2013-06-12 NOTE — Assessment & Plan Note (Signed)
Lab Results  Component Value Date   HGBA1C 5.0 05/14/2013   Pt currently on high dose steroids and that is contributing to elevated CBGs but pt previously on diet control. Continue current management unless CBGs continue to be markedly elevated during steroid treatment for AIHA.

## 2013-06-12 NOTE — Telephone Encounter (Addendum)
Pt cousin was called to inform of lab results yielded an improved Hgb level 9 adn that there was a new prednisone taper that needed to be made as follows:  Continue 60mg  for this week 40 mg x7 days 20 mg x7 days Repeat blood work at the end of that week  The cousin wrote this down and appt was confirmed for 06/18/13.   There was concern about the patient's legs still swollen but not actively draining and still shiny and cool. The pt is able to ambulate a little better than yesterday. She is still able to move her legs and there are no blue coloration or black toes/nails.  -decrease the salt intake -cont lasix twice a day -elevate legs -compression stockings  Signs and symptoms that would need immediate medical attention for threatened limb or heart failure exacerbation were explained to the patient and her cousin.   All questions were answered and plan reviewed with the cousin and patient.

## 2013-06-12 NOTE — Assessment & Plan Note (Signed)
This is new symptom for patient and started after hospital d/c. Pt on large dose steroids which maybe causing Na retention and LE edema other ddx include worsening CKD or diastolic HF but pt has no other symptoms of either. Chart review reveals some lower albumin (1.9) ?any component of nephrotic syndrome. No UPEP or SPEP completed per chart review.  -repeat Bmet -increased Lasix 40mg  BID and re-eval in 1 wk -pt may need nephrology evaluation

## 2013-06-12 NOTE — Assessment & Plan Note (Signed)
During hospitalization pt developed significant anemia from 10 >>6.7 and +Coombs test after ceftriaxone and possibly amlodipine. This was d/c immediately and added to allergies pt was then started on high dose steroids of 60mg  qdaily.  -recheck cbc -Hgb today 9.0 which is improving therefore will reduce and start steroid taper as follows:   60mg  daily for 7 days  40mg  daily for 7 days  20mg  daily for 7 days  Re-eval with CBC, haptoglobin, LDH for last 20mg  x 1 month maintenance before can be discontinued -pt was called and confirmed of this change in management -return in 1 wk for close follow up (when start of prednisone reduction to 40mg  should be confirmed)

## 2013-06-13 ENCOUNTER — Other Ambulatory Visit: Payer: Self-pay | Admitting: *Deleted

## 2013-06-13 MED ORDER — GLUCOSE BLOOD VI STRP: ORAL_STRIP | Status: DC

## 2013-06-13 NOTE — Addendum Note (Signed)
Addended by: Bufford Spikes on: 06/13/2013 11:24 AM   Modules accepted: Orders

## 2013-06-14 LAB — B. BURGDORFI ANTIBODIES BY WB
B BURGDORFERI IGG ABS (IB): NEGATIVE
B burgdorferi IgG Abs (IB): NEGATIVE
B burgdorferi IgM Abs (IB): NEGATIVE
B burgdorferi IgM Abs (IB): NEGATIVE

## 2013-06-17 ENCOUNTER — Encounter: Payer: Self-pay | Admitting: Internal Medicine

## 2013-06-18 ENCOUNTER — Encounter (HOSPITAL_COMMUNITY): Payer: Self-pay | Admitting: General Practice

## 2013-06-18 ENCOUNTER — Ambulatory Visit (INDEPENDENT_AMBULATORY_CARE_PROVIDER_SITE_OTHER): Payer: Medicaid Other | Admitting: Internal Medicine

## 2013-06-18 ENCOUNTER — Encounter: Payer: Self-pay | Admitting: Internal Medicine

## 2013-06-18 ENCOUNTER — Inpatient Hospital Stay (HOSPITAL_COMMUNITY)
Admission: AD | Admit: 2013-06-18 | Discharge: 2013-06-20 | DRG: 291 | Disposition: A | Discharge status: Home Health | Payer: Medicaid Other | Source: Ambulatory Visit | Attending: Internal Medicine | Admitting: Internal Medicine

## 2013-06-18 ENCOUNTER — Inpatient Hospital Stay (HOSPITAL_COMMUNITY): Payer: Medicaid Other

## 2013-06-18 VITALS — BP 144/73 | HR 51 | Temp 97.3°F | Ht 65.5 in | Wt 212.1 lb

## 2013-06-18 DIAGNOSIS — Z833 Family history of diabetes mellitus: Secondary | ICD-10-CM

## 2013-06-18 DIAGNOSIS — R600 Localized edema: Secondary | ICD-10-CM

## 2013-06-18 DIAGNOSIS — E781 Pure hyperglyceridemia: Secondary | ICD-10-CM | POA: Diagnosis present

## 2013-06-18 DIAGNOSIS — N183 Chronic kidney disease, stage 3 unspecified: Secondary | ICD-10-CM

## 2013-06-18 DIAGNOSIS — IMO0002 Reserved for concepts with insufficient information to code with codable children: Secondary | ICD-10-CM

## 2013-06-18 DIAGNOSIS — I5033 Acute on chronic diastolic (congestive) heart failure: Secondary | ICD-10-CM | POA: Diagnosis present

## 2013-06-18 DIAGNOSIS — N184 Chronic kidney disease, stage 4 (severe): Secondary | ICD-10-CM | POA: Diagnosis present

## 2013-06-18 DIAGNOSIS — R0602 Shortness of breath: Secondary | ICD-10-CM | POA: Diagnosis present

## 2013-06-18 DIAGNOSIS — R569 Unspecified convulsions: Secondary | ICD-10-CM

## 2013-06-18 DIAGNOSIS — R197 Diarrhea, unspecified: Secondary | ICD-10-CM

## 2013-06-18 DIAGNOSIS — I639 Cerebral infarction, unspecified: Secondary | ICD-10-CM | POA: Diagnosis present

## 2013-06-18 DIAGNOSIS — D638 Anemia in other chronic diseases classified elsewhere: Secondary | ICD-10-CM | POA: Diagnosis present

## 2013-06-18 DIAGNOSIS — Z7982 Long term (current) use of aspirin: Secondary | ICD-10-CM

## 2013-06-18 DIAGNOSIS — G8929 Other chronic pain: Secondary | ICD-10-CM | POA: Diagnosis present

## 2013-06-18 DIAGNOSIS — I5031 Acute diastolic (congestive) heart failure: Secondary | ICD-10-CM | POA: Diagnosis present

## 2013-06-18 DIAGNOSIS — E162 Hypoglycemia, unspecified: Secondary | ICD-10-CM

## 2013-06-18 DIAGNOSIS — R609 Edema, unspecified: Secondary | ICD-10-CM

## 2013-06-18 DIAGNOSIS — N189 Chronic kidney disease, unspecified: Secondary | ICD-10-CM | POA: Insufficient documentation

## 2013-06-18 DIAGNOSIS — M109 Gout, unspecified: Secondary | ICD-10-CM | POA: Diagnosis present

## 2013-06-18 DIAGNOSIS — I130 Hypertensive heart and chronic kidney disease with heart failure and stage 1 through stage 4 chronic kidney disease, or unspecified chronic kidney disease: Principal | ICD-10-CM | POA: Diagnosis present

## 2013-06-18 DIAGNOSIS — B182 Chronic viral hepatitis C: Secondary | ICD-10-CM | POA: Diagnosis present

## 2013-06-18 DIAGNOSIS — D591 Autoimmune hemolytic anemia, unspecified: Secondary | ICD-10-CM | POA: Diagnosis present

## 2013-06-18 DIAGNOSIS — G40909 Epilepsy, unspecified, not intractable, without status epilepticus: Secondary | ICD-10-CM | POA: Diagnosis present

## 2013-06-18 DIAGNOSIS — F411 Generalized anxiety disorder: Secondary | ICD-10-CM

## 2013-06-18 DIAGNOSIS — J984 Other disorders of lung: Secondary | ICD-10-CM | POA: Diagnosis present

## 2013-06-18 DIAGNOSIS — I509 Heart failure, unspecified: Secondary | ICD-10-CM | POA: Diagnosis present

## 2013-06-18 DIAGNOSIS — R635 Abnormal weight gain: Secondary | ICD-10-CM

## 2013-06-18 DIAGNOSIS — Z79899 Other long term (current) drug therapy: Secondary | ICD-10-CM

## 2013-06-18 DIAGNOSIS — K7460 Unspecified cirrhosis of liver: Secondary | ICD-10-CM | POA: Diagnosis present

## 2013-06-18 DIAGNOSIS — I2789 Other specified pulmonary heart diseases: Secondary | ICD-10-CM | POA: Diagnosis present

## 2013-06-18 DIAGNOSIS — E46 Unspecified protein-calorie malnutrition: Secondary | ICD-10-CM | POA: Diagnosis present

## 2013-06-18 DIAGNOSIS — E119 Type 2 diabetes mellitus without complications: Secondary | ICD-10-CM | POA: Diagnosis present

## 2013-06-18 DIAGNOSIS — R17 Unspecified jaundice: Secondary | ICD-10-CM

## 2013-06-18 DIAGNOSIS — I5189 Other ill-defined heart diseases: Secondary | ICD-10-CM | POA: Diagnosis present

## 2013-06-18 DIAGNOSIS — R911 Solitary pulmonary nodule: Secondary | ICD-10-CM | POA: Diagnosis present

## 2013-06-18 DIAGNOSIS — Z21 Asymptomatic human immunodeficiency virus [HIV] infection status: Secondary | ICD-10-CM | POA: Diagnosis present

## 2013-06-18 DIAGNOSIS — B1920 Unspecified viral hepatitis C without hepatic coma: Secondary | ICD-10-CM

## 2013-06-18 DIAGNOSIS — N179 Acute kidney failure, unspecified: Secondary | ICD-10-CM | POA: Diagnosis present

## 2013-06-18 DIAGNOSIS — B20 Human immunodeficiency virus [HIV] disease: Secondary | ICD-10-CM | POA: Diagnosis present

## 2013-06-18 DIAGNOSIS — Z87891 Personal history of nicotine dependence: Secondary | ICD-10-CM

## 2013-06-18 DIAGNOSIS — Z9861 Coronary angioplasty status: Secondary | ICD-10-CM

## 2013-06-18 DIAGNOSIS — Z8249 Family history of ischemic heart disease and other diseases of the circulatory system: Secondary | ICD-10-CM

## 2013-06-18 DIAGNOSIS — I129 Hypertensive chronic kidney disease with stage 1 through stage 4 chronic kidney disease, or unspecified chronic kidney disease: Secondary | ICD-10-CM

## 2013-06-18 DIAGNOSIS — E872 Acidosis, unspecified: Secondary | ICD-10-CM | POA: Diagnosis present

## 2013-06-18 DIAGNOSIS — I272 Pulmonary hypertension, unspecified: Secondary | ICD-10-CM

## 2013-06-18 DIAGNOSIS — I10 Essential (primary) hypertension: Secondary | ICD-10-CM | POA: Diagnosis present

## 2013-06-18 DIAGNOSIS — Z8673 Personal history of transient ischemic attack (TIA), and cerebral infarction without residual deficits: Secondary | ICD-10-CM

## 2013-06-18 DIAGNOSIS — E8809 Other disorders of plasma-protein metabolism, not elsewhere classified: Secondary | ICD-10-CM | POA: Diagnosis present

## 2013-06-18 HISTORY — DX: Heart failure, unspecified: I50.9

## 2013-06-18 HISTORY — DX: Hypertensive encephalopathy: I67.4

## 2013-06-18 HISTORY — DX: Autoimmune hemolytic anemia, unspecified: D59.10

## 2013-06-18 HISTORY — DX: Shortness of breath: R06.02

## 2013-06-18 HISTORY — DX: Other autoimmune hemolytic anemias: D59.1

## 2013-06-18 HISTORY — DX: Anxiety disorder, unspecified: F41.9

## 2013-06-18 HISTORY — DX: Type 2 diabetes mellitus without complications: E11.9

## 2013-06-18 LAB — COMPREHENSIVE METABOLIC PANEL
ALK PHOS: 145 U/L — AB (ref 39–117)
ALT: 133 U/L — AB (ref 0–35)
ALT: 129 U/L — ABNORMAL HIGH (ref 0–35)
AST: 67 U/L — ABNORMAL HIGH (ref 0–37)
AST: 75 U/L — ABNORMAL HIGH (ref 0–37)
Albumin: 2.3 g/dL — ABNORMAL LOW (ref 3.5–5.2)
Albumin: 2.2 g/dL — ABNORMAL LOW (ref 3.5–5.2)
Alkaline Phosphatase: 157 U/L — ABNORMAL HIGH (ref 39–117)
BUN: 61 mg/dL — ABNORMAL HIGH (ref 6–23)
BUN: 60 mg/dL — AB (ref 6–23)
CALCIUM: 7.9 mg/dL — AB (ref 8.4–10.5)
CHLORIDE: 107 meq/L (ref 96–112)
CO2: 14 meq/L — AB (ref 19–32)
CO2: 15 mEq/L — ABNORMAL LOW (ref 19–32)
CREATININE: 1.95 mg/dL — AB (ref 0.50–1.10)
Calcium: 7.8 mg/dL — ABNORMAL LOW (ref 8.4–10.5)
Chloride: 110 mEq/L (ref 96–112)
Creat: 1.95 mg/dL — ABNORMAL HIGH (ref 0.50–1.10)
GFR calc non Af Amer: 28 mL/min — ABNORMAL LOW (ref >90)
GFR, EST AFRICAN AMERICAN: 32 mL/min — AB (ref >90)
GLUCOSE: 76 mg/dL (ref 70–99)
Glucose, Bld: 63 mg/dL — ABNORMAL LOW (ref 70–99)
POTASSIUM: 3.6 meq/L (ref 3.5–5.3)
Potassium: 3.4 mEq/L — ABNORMAL LOW (ref 3.7–5.3)
SODIUM: 138 meq/L (ref 135–145)
Sodium: 141 mEq/L (ref 137–147)
TOTAL PROTEIN: 5.9 g/dL — AB (ref 6.0–8.3)
Total Bilirubin: 0.3 mg/dL (ref 0.3–1.2)
Total Bilirubin: 0.3 mg/dL (ref 0.3–1.2)
Total Protein: 5.6 g/dL — ABNORMAL LOW (ref 6.0–8.3)

## 2013-06-18 LAB — CBC WITH DIFFERENTIAL/PLATELET
BASOS ABS: 0 10*3/uL (ref 0.0–0.1)
BASOS PCT: 0 % (ref 0–1)
Eosinophils Absolute: 0 10*3/uL (ref 0.0–0.7)
Eosinophils Relative: 0 % (ref 0–5)
HEMATOCRIT: 25.9 % — AB (ref 36.0–46.0)
Hemoglobin: 8.9 g/dL — ABNORMAL LOW (ref 12.0–15.0)
Lymphocytes Relative: 21 % (ref 12–46)
Lymphs Abs: 1.6 10*3/uL (ref 0.7–4.0)
MCH: 34.4 pg — ABNORMAL HIGH (ref 26.0–34.0)
MCHC: 34.4 g/dL (ref 30.0–36.0)
MCV: 100 fL (ref 78.0–100.0)
MONO ABS: 0.8 10*3/uL (ref 0.1–1.0)
Monocytes Relative: 12 % (ref 3–12)
Neutro Abs: 4.9 10*3/uL (ref 1.7–7.7)
Neutrophils Relative %: 67 % (ref 43–77)
Platelets: 152 10*3/uL (ref 150–400)
RBC: 2.59 MIL/uL — ABNORMAL LOW (ref 3.87–5.11)
RDW: 17.1 % — AB (ref 11.5–15.5)
WBC: 7.3 10*3/uL (ref 4.0–10.5)

## 2013-06-18 LAB — GLUCOSE, CAPILLARY
GLUCOSE-CAPILLARY: 62 mg/dL — AB (ref 70–99)
GLUCOSE-CAPILLARY: 53 mg/dL — AB (ref 70–99)
GLUCOSE-CAPILLARY: 146 mg/dL — AB (ref 70–99)
Glucose-Capillary: 72 mg/dL (ref 70–99)
Glucose-Capillary: 92 mg/dL (ref 70–99)

## 2013-06-18 LAB — PROTIME-INR
INR: 0.99 (ref 0.00–1.49)
Prothrombin Time: 12.9 seconds (ref 11.6–15.2)

## 2013-06-18 LAB — PRO B NATRIURETIC PEPTIDE: Pro B Natriuretic peptide (BNP): 374.8 pg/mL — ABNORMAL HIGH (ref 0–125)

## 2013-06-18 LAB — ACETAMINOPHEN LEVEL: Acetaminophen (Tylenol), Serum: <15 ug/mL (ref 10–30)

## 2013-06-18 LAB — TROPONIN I: Troponin I: <0.3 ng/mL (ref <0.30)

## 2013-06-18 LAB — MAGNESIUM: MAGNESIUM: 1.6 mg/dL (ref 1.5–2.5)

## 2013-06-18 LAB — PHENYTOIN LEVEL, TOTAL: Phenytoin Lvl: 3.8 ug/mL — ABNORMAL LOW (ref 10.0–20.0)

## 2013-06-18 MED ORDER — CLONIDINE HCL 0.2 MG PO TABS
0.2000 mg | ORAL_TABLET | Freq: Two times a day (BID) | ORAL | Status: DC
  Administered 2013-06-18 – 2013-06-20 (×4): 0.2 mg via ORAL
  Filled 2013-06-18 (×5): qty 1

## 2013-06-18 MED ORDER — HYDROCODONE-ACETAMINOPHEN 10-325 MG PO TABS: 1.0000 | ORAL_TABLET | Freq: Four times a day (QID) | ORAL | Status: DC | PRN

## 2013-06-18 MED ORDER — LAMIVUDINE 150 MG PO TABS
300.0000 mg | ORAL_TABLET | Freq: Every day | ORAL | Status: DC
  Filled 2013-06-18: qty 2

## 2013-06-18 MED ORDER — LEVETIRACETAM 750 MG PO TABS
1500.0000 mg | ORAL_TABLET | Freq: Two times a day (BID) | ORAL | Status: DC
  Administered 2013-06-18 – 2013-06-20 (×4): 1500 mg via ORAL
  Filled 2013-06-18 (×5): qty 2

## 2013-06-18 MED ORDER — SODIUM CHLORIDE 0.9 % IJ SOLN
3.0000 mL | Freq: Two times a day (BID) | INTRAMUSCULAR | Status: DC
  Administered 2013-06-18 – 2013-06-20 (×4): 3 mL via INTRAVENOUS

## 2013-06-18 MED ORDER — LORAZEPAM 1 MG PO TABS
1.0000 mg | ORAL_TABLET | Freq: Every day | ORAL | Status: DC
  Administered 2013-06-18 – 2013-06-20 (×3): 1 mg via ORAL
  Filled 2013-06-18 (×3): qty 1

## 2013-06-18 MED ORDER — VITAMIN D3 25 MCG (1000 UNIT) PO TABS
1000.0000 [IU] | ORAL_TABLET | Freq: Every day | ORAL | Status: DC
  Administered 2013-06-19 – 2013-06-20 (×2): 1000 [IU] via ORAL
  Filled 2013-06-18 (×2): qty 1

## 2013-06-18 MED ORDER — ACYCLOVIR 800 MG PO TABS
800.0000 mg | ORAL_TABLET | Freq: Every day | ORAL | Status: DC
  Administered 2013-06-18 – 2013-06-20 (×3): 800 mg via ORAL
  Filled 2013-06-18 (×3): qty 1

## 2013-06-18 MED ORDER — SODIUM CHLORIDE 0.9 % IV SOLN: 250.0000 mL | INTRAVENOUS | Status: DC | PRN

## 2013-06-18 MED ORDER — METHOCARBAMOL 500 MG PO TABS
1000.0000 mg | ORAL_TABLET | Freq: Two times a day (BID) | ORAL | Status: DC
  Administered 2013-06-18 – 2013-06-20 (×4): 1000 mg via ORAL
  Filled 2013-06-18 (×5): qty 2

## 2013-06-18 MED ORDER — PREDNISONE 20 MG PO TABS
40.0000 mg | ORAL_TABLET | Freq: Every day | ORAL | Status: DC
  Administered 2013-06-19 – 2013-06-20 (×2): 40 mg via ORAL
  Filled 2013-06-18 (×3): qty 2

## 2013-06-18 MED ORDER — PANTOPRAZOLE SODIUM 40 MG PO TBEC
40.0000 mg | DELAYED_RELEASE_TABLET | Freq: Every day | ORAL | Status: DC
  Administered 2013-06-19 – 2013-06-20 (×2): 40 mg via ORAL
  Filled 2013-06-18 (×2): qty 1

## 2013-06-18 MED ORDER — SODIUM CHLORIDE 0.9 % IJ SOLN: 3.0000 mL | INTRAMUSCULAR | Status: DC | PRN

## 2013-06-18 MED ORDER — LOPINAVIR-RITONAVIR 200-50 MG PO TABS
2.0000 | ORAL_TABLET | Freq: Two times a day (BID) | ORAL | Status: DC
  Administered 2013-06-18: 2 via ORAL
  Filled 2013-06-18 (×3): qty 2

## 2013-06-18 MED ORDER — FUROSEMIDE 10 MG/ML IJ SOLN
40.0000 mg | Freq: Two times a day (BID) | INTRAMUSCULAR | Status: DC
Start: 2013-06-18 — End: 2013-06-20
  Administered 2013-06-18 – 2013-06-19 (×3): 40 mg via INTRAVENOUS
  Filled 2013-06-18 (×6): qty 4

## 2013-06-18 MED ORDER — OXYCODONE HCL 5 MG PO TABS: 10.0000 mg | ORAL_TABLET | Freq: Four times a day (QID) | ORAL | Status: DC | PRN

## 2013-06-18 MED ORDER — LISINOPRIL 40 MG PO TABS
40.0000 mg | ORAL_TABLET | Freq: Every day | ORAL | Status: DC
  Administered 2013-06-18 – 2013-06-20 (×3): 40 mg via ORAL
  Filled 2013-06-18 (×3): qty 1

## 2013-06-18 MED ORDER — ABACAVIR SULFATE 300 MG PO TABS
600.0000 mg | ORAL_TABLET | Freq: Every day | ORAL | Status: DC
  Administered 2013-06-19 – 2013-06-20 (×2): 600 mg via ORAL
  Filled 2013-06-18 (×2): qty 2

## 2013-06-18 MED ORDER — LABETALOL HCL 100 MG PO TABS
100.0000 mg | ORAL_TABLET | Freq: Two times a day (BID) | ORAL | Status: DC
  Administered 2013-06-18 – 2013-06-20 (×5): 100 mg via ORAL
  Filled 2013-06-18 (×7): qty 1

## 2013-06-18 MED ORDER — SODIUM CHLORIDE 0.9 % IJ SOLN
3.0000 mL | Freq: Two times a day (BID) | INTRAMUSCULAR | Status: DC
  Administered 2013-06-18 – 2013-06-20 (×5): 3 mL via INTRAVENOUS

## 2013-06-18 MED ORDER — ASPIRIN EC 81 MG PO TBEC
81.0000 mg | DELAYED_RELEASE_TABLET | Freq: Every day | ORAL | Status: DC
  Administered 2013-06-19 – 2013-06-20 (×2): 81 mg via ORAL
  Filled 2013-06-18 (×2): qty 1

## 2013-06-18 MED ORDER — TRIAMCINOLONE ACETONIDE 0.1 % EX CREA: 1.0000 "application " | TOPICAL_CREAM | Freq: Two times a day (BID) | CUTANEOUS | Status: DC | PRN

## 2013-06-18 MED ORDER — ABACAVIR SULFATE-LAMIVUDINE 600-300 MG PO TABS
1.0000 | ORAL_TABLET | Freq: Every day | ORAL | Status: DC
  Filled 2013-06-18: qty 1

## 2013-06-18 MED ORDER — HEPARIN SODIUM (PORCINE) 5000 UNIT/ML IJ SOLN
5000.0000 [IU] | Freq: Three times a day (TID) | INTRAMUSCULAR | Status: DC
  Administered 2013-06-18 – 2013-06-20 (×7): 5000 [IU] via SUBCUTANEOUS
  Filled 2013-06-18 (×9): qty 1

## 2013-06-18 MED ORDER — PHENYTOIN 50 MG PO CHEW
100.0000 mg | CHEWABLE_TABLET | Freq: Three times a day (TID) | ORAL | Status: DC
  Administered 2013-06-18 – 2013-06-20 (×7): 100 mg via ORAL
  Filled 2013-06-18 (×8): qty 2

## 2013-06-18 NOTE — Progress Notes (Signed)
Hypoglycemic Event  CBG: 62  Treatment: Glucose tablets X 3  Symptoms:  Sleepy, slurred speach  Follow-up CBG: Time: CBG Result:53  Possible Reasons for Event:  Diet, medication increase by pt.  Comments/MD notified: pt is being admitted.  Repeat Glucose after giving patient Henderson Cloud and Orange juice ah Glucose Gel.  Glucose up to 72.    Michelle [name], Michelle [name]  Remember to initiate Hypoglycemia Order Set & complete

## 2013-06-18 NOTE — Progress Notes (Addendum)
TONISHA JOYE [id number] Admission Data: 06/18/2013 2:25 PM Attending Provider: Farley Ly, MD  XNT:[id number], Arna Medici, MD Consults/ Treatment Team:    Michelle [name] is a 53 y.o. female patient admitted from Outpatient, awake, alert  & orientated  X 3,  Full Code, VSS - Blood pressure 175/84, pulse 62, resp. rate 17, SpO2 100.00%., no c/o shortness of breath, no c/o chest pain, no distress noted. Tele # 09 placed and pt is currently running:NSR     Allergies:   Allergies  Allergen Reactions  . 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 Norvasc [Amlodipine Besylate]     Itching, rash , hives .   [name] Morphine And Related Hives, Itching and Rash     Pt orientation to unit, room and routine. Information packet given to patient/family and safety video watched.  Admission INP armband ID verified with patient/family, and in place. SR up x 2, fall risk assessment complete with Patient and family verbalizing understanding of risks associated with falls. Pt verbalizes an understanding of how to use the call bell and to call for help before getting out of bed.  R leg excoriation noted, leg weeping noted, clear fluid. L forearm has a small, circular wound. Patient states " I pick at my arm" Foam dsg. Applied.      Will cont to monitor and assist as needed.  Kern Reap, RN 06/18/2013 2:25 PM

## 2013-06-18 NOTE — H&P (Signed)
[facility] Admission Note Date: 06/18/2013  Patient name: Michelle [name] Medical record number: [id number] Date of birth: May 31, 1960 Age: 53 y.o. Gender: female PCP: Michelle Gallant, MD  Medical Service: Internal Medicine Teaching Service, B1 [facility]  Attending physician: Michelle [name]     Chief Complaint: LE Edema and weight gain  History of Present Illness: This is a 53 year old woman with a PMH of CHF, HIV, chronic HCV, seizure disorder, stroke, HTN, T2DM, CKD, gout, and AIHA who presents to clinic complaining of LE edema and weight gain worsening x 4-5 days. She was discharged from [name] about two weeks ago following a hospitalization for hypertensive encephalopathy complicated by a gout flare and autoimmune hemolytic anemia and is currently on a prednisone taper. Michelle [name] is not able to give a particularly linear account of her illness but notes that she has had worsening of her LE edema, weight gain, and yellowing of her skin for the past 4-5 days. This is associated with dyspnea on exertion as well as mild sternal chest pain that improves with deep breathing. She denies nausea, diaphoresis, orthopnea and PND and does not have dyspnea at rest. She endorses intermittent headache and blurry vision which is improved by taking Norco. Of note, in clinic she was symptomatically hypoglycemic following taking a double dose of her insulin because she thought she was "behind" on her dosing.  ROS is otherwise positive for one episode of diarrhea 3 weeks ago as well as new onset watery, nonbloody diarrhea today associated with mild, intermittent abdominal cramping, which she attributes to the medicines she has been given here at the hospital.   Meds: No current facility-administered medications on file prior to encounter.   Current Outpatient Prescriptions on File Prior to Encounter  Medication Sig Dispense Refill  . abacavir-lamiVUDine (EPZICOM) 600-300 MG per tablet Take 1 tablet by mouth daily.  30  tablet  3  . acyclovir (ZOVIRAX) 800 MG tablet Take 1 tablet (800 mg total) by mouth daily.  30 tablet  3  . aspirin EC 81 MG tablet Take 1 tablet (81 mg total) by mouth daily.  30 tablet  11  . Blood Glucose Monitoring Suppl (ACCU-CHEK AVIVA PLUS) W/DEVICE KIT USE AS DIRECTED  1 kit  0  . cholecalciferol (VITAMIN D) 1000 UNITS tablet Take 1,000 Units by mouth daily.      . cloNIDine (CATAPRES) 0.2 MG tablet Take 1 tablet (0.2 mg total) by mouth 2 (two) times daily.  60 tablet  3  . furosemide (LASIX) 40 MG tablet Take 1 tablet (40 mg total) by mouth daily.  30 tablet  3  . glucose blood (ACCU-CHEK AVIVA) test strip Use as instructed  100 each  12  . HYDROcodone-acetaminophen (NORCO) 10-325 MG per tablet Take 1 tablet by mouth every 6 (six) hours as needed.  30 tablet  0  . labetalol (NORMODYNE) 100 MG tablet Take 1 tablet (100 mg total) by mouth 2 (two) times daily.  60 tablet  3  . levETIRAcetam (KEPPRA) 500 MG tablet Take 3 tablets (1,500 mg total) by mouth every 12 (twelve) hours.  180 tablet  3  . lisinopril (PRINIVIL,ZESTRIL) 40 MG tablet Take 1 tablet (40 mg total) by mouth daily.  30 tablet  3  . lopinavir-ritonavir (KALETRA) 200-50 MG per tablet Take 2 tablets by mouth 2 (two) times daily.  120 tablet  3  . LORazepam (ATIVAN) 1 MG tablet Take 1 mg by mouth daily. Take one tablet for seizure  prevention.      . methocarbamol (ROBAXIN) 500 MG tablet Take 2 tablets (1,000 mg total) by mouth 2 (two) times daily. Take two 500 mg tablets by mouth four times daily  30 tablet  1  . pantoprazole (PROTONIX) 40 MG tablet Take 1 tablet (40 mg total) by mouth daily.  30 tablet  11  . phenytoin (DILANTIN) 50 MG tablet Chew 2 tablets (100 mg total) by mouth 3 (three) times daily.  180 tablet  3  . predniSONE (DELTASONE) 20 MG tablet Take 3 tablets (60 mg total) by mouth daily with breakfast.  60 tablet  0  . triamcinolone cream (KENALOG) 0.1 % Apply 1 application topically 2 (two) times daily as needed.  For rash.        Allergies: Allergies as of 06/18/2013 - Review Complete 06/18/2013  Allergen Reaction Noted  . Ceftriaxone  06/01/2013  . Norvasc [amlodipine besylate]  03/30/2013  . Morphine and related Hives, Itching, and Rash 02/15/2013   Past Medical History  Diagnosis Date  . Seizures   . Stroke   . Meningitis   . Diabetes mellitus without complication   . HIV (human immunodeficiency virus infection)   . Hypertension   . Gout   . Muscle spasms of head and/or neck   . CKD (chronic kidney disease)    No past surgical history on file. Family History  Problem Relation Age of Onset  . Cancer - Colon Mother   . Cancer Father   . Diabetes    . Hypertension Father    History   Social History  . Marital Status: Single    Spouse Name: N/A    Number of Children: 4  . Years of Education: 11th   Occupational History  . Not on file.   Social History Main Topics  . Smoking status: Former Smoker    Types: Cigarettes  . Smokeless tobacco: Never Used  . Alcohol Use: No  . Drug Use: No     Comment: past history of alcohol, cocaine and IV drug use  . Sexual Activity: Not Currently    Partners: Male     Comment: given condoms   Other Topics Concern  . Not on file   Social History Narrative   Patient lives at home with sister.    Patient is unemployed.    Patient is single.    Patient has 2 alive and 2 deceased.    Patient has 11th grade education.           Review of Systems: Review of Systems  Constitutional: Negative for fever, chills, weight loss and diaphoresis.  HENT: Negative for congestion and sore throat.   Eyes: Positive for blurred vision.  Respiratory: Positive for shortness of breath. Negative for cough.   Cardiovascular: Positive for chest pain and leg swelling. Negative for palpitations, orthopnea and PND.  Gastrointestinal: Positive for diarrhea. Negative for nausea, vomiting, constipation and blood in stool.  Genitourinary: Negative for  dysuria and urgency.  Skin:       + jaundice.  Neurological: Positive for headaches (patient reports daily headaches ongoing for 6 years, 7/10, relieved completely by Norco.). Negative for dizziness, focal weakness and weakness.    Physical Exam: Blood pressure 175/84, pulse 62, resp. rate 17, SpO2 100.00%.  Physical Exam: GENERAL: sitting up on side of bed, ambulates to bathroom with no assistance, in NAD. NEURO: alert and oriented x 3. 5/5 strength. Gross motor intact though gait is slow with mild side to  side rocking (i.e. Trendelenberg gait). CN II-XII intact in detail. HEENT: [place]/AT. PERLLA. Scleral icterus noted. No oropharyngeal erythema or exudate. CHEST: Lungs CTAB with normal WOB on room air. RRR with possible S4 vs split S1 appreciated. No rubs or murmurs. ABDOMEN: Obese, moderately distended with mild fluid wave and shifting dullness. EXTREMITIES: 4+ pitting edema to knees bilaterally.  Lab results: Results for orders placed during the hospital encounter of 06/18/13 (from the past 24 hour(s))  GLUCOSE, CAPILLARY     Status: None   Collection Time    06/18/13 11:59 AM      Result Value Range   Glucose-Capillary 92  70 - 99 mg/dL  COMPREHENSIVE METABOLIC PANEL     Status: Abnormal   Collection Time    06/18/13 12:10 PM      Result Value Range   Sodium 141  137 - 147 mEq/L   Potassium 3.4 (*) 3.7 - 5.3 mEq/L   Chloride 110  96 - 112 mEq/L   CO2 15 (*) 19 - 32 mEq/L   Glucose, Bld 76  70 - 99 mg/dL   BUN 60 (*) 6 - 23 mg/dL   Creatinine, Ser 1.95 (*) 0.50 - 1.10 mg/dL   Calcium 7.8 (*) 8.4 - 10.5 mg/dL   Total Protein 5.6 (*) 6.0 - 8.3 g/dL   Albumin 2.2 (*) 3.5 - 5.2 g/dL   AST 75 (*) 0 - 37 U/L   ALT 129 (*) 0 - 35 U/L   Alkaline Phosphatase 145 (*) 39 - 117 U/L   Total Bilirubin 0.3  0.3 - 1.2 mg/dL   GFR calc non Af Amer 28 (*) >90 mL/min   GFR calc Af Amer 32 (*) >90 mL/min  MAGNESIUM     Status: None   Collection Time    06/18/13 12:10 PM      Result Value  Range   Magnesium 1.6  1.5 - 2.5 mg/dL  PROTIME-INR     Status: None   Collection Time    06/18/13 12:10 PM      Result Value Range   Prothrombin Time 12.9  11.6 - 15.2 seconds   INR 0.99  0.00 - 1.49  PRO B NATRIURETIC PEPTIDE     Status: Abnormal   Collection Time    06/18/13 12:10 PM      Result Value Range   Pro B Natriuretic peptide (BNP) 374.8 (*) 0 - 125 pg/mL  CBC WITH DIFFERENTIAL     Status: Abnormal   Collection Time    06/18/13 12:10 PM      Result Value Range   WBC 7.3  4.0 - 10.5 K/uL   RBC 2.59 (*) 3.87 - 5.11 MIL/uL   Hemoglobin 8.9 (*) 12.0 - 15.0 g/dL   HCT 25.9 (*) 36.0 - 46.0 %   MCV 100.0  78.0 - 100.0 fL   MCH 34.4 (*) 26.0 - 34.0 pg   MCHC 34.4  30.0 - 36.0 g/dL   RDW 17.1 (*) 11.5 - 15.5 %   Platelets 152  150 - 400 K/uL   Neutrophils Relative % 67  43 - 77 %   Neutro Abs 4.9  1.7 - 7.7 K/uL   Lymphocytes Relative 21  12 - 46 %   Lymphs Abs 1.6  0.7 - 4.0 K/uL   Monocytes Relative 12  3 - 12 %   Monocytes Absolute 0.8  0.1 - 1.0 K/uL   Eosinophils Relative 0  0 - 5 %   Eosinophils Absolute  0.0  0.0 - 0.7 K/uL   Basophils Relative 0  0 - 1 %   Basophils Absolute 0.0  0.0 - 0.1 K/uL  TROPONIN I     Status: None   Collection Time    06/18/13 12:10 PM      Result Value Range   Troponin I <0.30  <0.30 ng/mL   HCV genotype 1b POSITIVE  05/21/13 with HCV quant log 6.63.  Imaging results:  No results found.  Other results: EKG pending  Assessment & Plan by Problem:  LE Edema, weight gain, dyspnea on exertion with mild chest pain, may be 2/2 CHF exacerbation with cardiorenal syndrome, vs nephrotic syndrome given recent slight elevation of creatinine (lowest value 1/4 was 0.9, but over past two months Cr has ranged from 1.55-2.09; Cr is 1.95 today) and proteinuria on UA. Considering 24 hour urine protein, but differentiating between nephrotic syndrome and CHF causing her volume overload is not likely to change our management at this stage, the  centerpiece of which will be aggressive diuresis. In terms of etiology, patient has been on prednisone taper with increased appetite which may have led to volume overload. Troponin x 1 negative and given character of chest pain (relieved with deep breathing) not likely ACS.  - ECG, CXR to r/o ACS, PNA, PTX - aspirin 81 mg daily - furosemide 40 mg IV BID (home dose is 40 mg po daily); will reassess volume status and consider decreasing dose tomorrow 1/21.  Jaundice, elev LFTs, HCV+. AST (75), ALT (129), Alk Phos (145) newly elevated in setting of active HCV infection (genotype 1b). HCV is of course at the top of the differential, but patient is also on many medications that can cause liver damage and has multiple bottles of Phenytoin, lopinavir-ritonavir, and levetiracetam which may have resulted in unintentional overdosing. Less likely to be a biliary process given normal Tbili, benign RUQ exam, though recent hemolytic anemia would put her at risk for gallstones. Patient has had previous exposure to hepatitis A and has been vaccinated for hepatitis B. - changed home Norco to oxycodone without acetaminophen. - check keppra, dilantin, acetaminophen levels - daily CMET  HTN: 175/84 on admission. Will restart home meds and re-evaluate. - labetalol 100 mg BID - clonidine 0.2 mg BID - lisinopril 40 mg daily  T2DM/hypoglycemia: given recent episodes of symptomatic hypoglycemia and A1C 5.0 on 05/14/13, patient has been only diet controlled since discharge in December but apparently took 2x her usual dose of insulin this morning because she felt that she was behind on her insulin. CBG at admission 76 up from 51 in clinic. - patient currently asymptomatic - holding insulin - carb modified diet - daily CBGs  HIV: most recent viral load on 12/9 was undetectable. - continue home ART - lopinovir-ritonavir 200-50 mg per tablet, 2 tablets po BID - abacavir-lamivudine 600-300 mg one tablet po daily -  acyclovir 800 mg daily   AIHA: diagnosed during recent admission.  - continue prednisone taper: 60 mg daily today, then decrease to 40 mg daily tomorrow (1/21), then decrease to 20 mg daily on 1/28 x 30 days, then re-evaluate with CBC, haptoglobin, LDH before discontinuing.  Seizures: continue home meds - levetiracetam 1500 mg po Q12 - phenytoin 100 mg po TID - check keppra and dilantin levels  CKD: lowest value 1/4 was 0.9, but may have been falsely low given that otherwise over past two months Cr has ranged from 1.55-2.09; Cr is 1.95 today. Likely some component of cardiorenal  syndrome given CHF, fluid overload.  - limit new nephrotoxic meds. - will aggressively diurese while continuing to follow Cr.  Diarrhea: Ongoing for 1 day with previous episode 3 weeks ago. No recent antibiotics in outpatient setting but will check C. Difficile given recent hospitalization. . Patient attributes diarrhea to her medications and in absence of C. Diff this is a real possibility given the plethora and types of meds she is on. - C. Diff antigen pending. - enteric precautions - continue to monitor  Chronic pain:  - home methocarbamol 100 mg BID - Home Norco changed to oxycodone 10 mg po PRN severe pain  Anxiety: patient reports feeling anxious about her current symptoms. - continue home lorazepam 1 mg daily  FEN/GI: patient currently fluid overloaded and with low BG. Low albumin and total protein - reflective of likely protein-calorie malnutrition. - carb modified diet - holding IV fluids for now - continue home vitamin D supplementation - pantoprazole 40 mg daily for GERD  PPx: - subcutaneous heparin 5000 units TID  Dispo: pending improvement of patient's medical problems. Patient has previously been set up with [facility] for home health nursing and really should have this arranged upon discharge again as she seems to have difficulty taking her medications appropriately.  This is a  Careers information officer Note.  The care of the patient was discussed with Dr. Blaine Hamper and the assessment and plan was formulated with their assistance.  Please see their note for official documentation of the patient encounter.   SignedCorey Skains 06/18/2013, 1:32 PM

## 2013-06-18 NOTE — Assessment & Plan Note (Signed)
Patient having increasing knee expanding lower extremity edema that is now frankly weeping clear fluid and patient has significant weight gain and worsening kidney function therefore was admitted for workup and management. Wt Readings from Last 3 Encounters:  06/18/13 212 lb 1.6 oz (96.208 kg)  06/11/13 204 lb 14.4 oz (92.942 kg)  06/02/13 183 lb 3.2 oz (83.099 kg)

## 2013-06-18 NOTE — Assessment & Plan Note (Signed)
Pt is having worsening kidney function and was admitted for further evaluation. Some concern for nephrotic syndrome.  Lab Results  Component Value Date   CREATININE 1.95* 06/18/2013

## 2013-06-18 NOTE — H&P (Signed)
Hospital Admission Note Date: 06/18/2013  Patient name: Michelle [name] Medical record number: [id number] Date of birth: 05-24-1961 Age: 53 y.o. Gender: female PCP: Clinton Gallant, MD  Medical Service: IMTS  Attending: Dr. Marinda Elk   Internal Medicine Teaching Service Contact Information   Weekday Hours (7AM-5PM):   1st contact: Chalmers Cater, Medical student 4th grade: Pgr: [phone] 2nd contact: Dr. Jerene Pitch pgr: [phone]   ** If no return call within 15 minutes (after trying both pagers listed above), please call after hours pagers.  After 5 pm or weekends:   1st Contact: Pager: [phone]  2nd Contact: Pager: [phone]   Chief Complaint: LE Edema and weight gain  History of Present Illness:  This is a 53 year old woman with PMH of CHF (EF normal, with Grade-2 diastolic dysfunction), HIV (CD4 490 and VL<20 on 05/07/13), chronic HCV, seizure disorder, hx of stroke, HTN, T2DM, CKD, gout, and AIHA, who presents to clinic complaining of LE edema and weight gain.  The patient was recently hospitalized because of hypertensive encephalopathy secondary to noncompliance to the medications. Patient was also diagnosed with autoimmune hemolytic anemia and is currently on tapering dose of prednisone. The patient reports that she has been compliant to her medications, including Lasix 40 mg twice a day. She reports that her leg edema has been progressively getting worse in the past 5 day and her BW also increased. She was found to have 24 pound increase in her body weight in clinic. This is associated with dyspnea on exertion as well as mild sternal chest pain that improves with deep breathing. She denies nausea, diaphoresis, orthopnea and PND and does not have dyspnea at rest. She endorses intermittent headache and blurry vision which is improved by taking Norco. Of note, in clinic she was symptomatically hypoglycemic following taking a double dose of her insulin because she thought she was "behind" on  her dosing.  ROS: has watery, nonbloody diarrhea today associated with mild, intermittent abdominal cramping. Has HA, SOB, leg edema, and mild intermittent chest pain. Patient has yellow skin.   Meds: Current Outpatient Rx  Name  Route  Sig  Dispense  Refill  . abacavir-lamiVUDine (EPZICOM) 600-300 MG per tablet   Oral   Take 1 tablet by mouth daily.   30 tablet   3   . acyclovir (ZOVIRAX) 800 MG tablet   Oral   Take 1 tablet (800 mg total) by mouth daily.   30 tablet   3   . aspirin EC 81 MG tablet   Oral   Take 1 tablet (81 mg total) by mouth daily.   30 tablet   11   . Blood Glucose Monitoring Suppl (ACCU-CHEK AVIVA PLUS) W/DEVICE KIT      USE AS DIRECTED   1 kit   0   . cholecalciferol (VITAMIN D) 1000 UNITS tablet   Oral   Take 1,000 Units by mouth daily.         . cloNIDine (CATAPRES) 0.2 MG tablet   Oral   Take 1 tablet (0.2 mg total) by mouth 2 (two) times daily.   60 tablet   3   . furosemide (LASIX) 40 MG tablet   Oral   Take 1 tablet (40 mg total) by mouth daily.   30 tablet   3   . glucose blood (ACCU-CHEK AVIVA) test strip      Use as instructed   100 each   12   . HYDROcodone-acetaminophen (NORCO) 10-325 MG per tablet  Oral   Take 1 tablet by mouth every 6 (six) hours as needed.   30 tablet   0   . labetalol (NORMODYNE) 100 MG tablet   Oral   Take 1 tablet (100 mg total) by mouth 2 (two) times daily.   60 tablet   3   . levETIRAcetam (KEPPRA) 500 MG tablet   Oral   Take 3 tablets (1,500 mg total) by mouth every 12 (twelve) hours.   180 tablet   3   . lisinopril (PRINIVIL,ZESTRIL) 40 MG tablet   Oral   Take 1 tablet (40 mg total) by mouth daily.   30 tablet   3   . lopinavir-ritonavir (KALETRA) 200-50 MG per tablet   Oral   Take 2 tablets by mouth 2 (two) times daily.   120 tablet   3   . LORazepam (ATIVAN) 1 MG tablet   Oral   Take 1 mg by mouth daily. Take one tablet for seizure prevention.         .  methocarbamol (ROBAXIN) 500 MG tablet   Oral   Take 2 tablets (1,000 mg total) by mouth 2 (two) times daily. Take two 500 mg tablets by mouth four times daily   30 tablet   1   . pantoprazole (PROTONIX) 40 MG tablet   Oral   Take 1 tablet (40 mg total) by mouth daily.   30 tablet   11   . phenytoin (DILANTIN) 50 MG tablet   Oral   Chew 2 tablets (100 mg total) by mouth 3 (three) times daily.   180 tablet   3   . predniSONE (DELTASONE) 20 MG tablet   Oral   Take 3 tablets (60 mg total) by mouth daily with breakfast.   60 tablet   0   . triamcinolone cream (KENALOG) 0.1 %   Topical   Apply 1 application topically 2 (two) times daily as needed. For rash.           Allergies: Allergies as of 06/18/2013 - Review Complete 06/18/2013  Allergen Reaction Noted  . Ceftriaxone  06/01/2013  . Norvasc [amlodipine besylate]  03/30/2013  . Morphine and related Hives, Itching, and Rash 02/15/2013   Past Medical History  Diagnosis Date  . Seizures   . Stroke   . Meningitis   . Diabetes mellitus without complication   . HIV (human immunodeficiency virus infection)   . Hypertension   . Gout   . Muscle spasms of head and/or neck   . CKD (chronic kidney disease)    No past surgical history on file. Family History  Problem Relation Age of Onset  . Cancer - Colon Mother   . Cancer Father   . Diabetes    . Hypertension Father    History   Social History  . Marital Status: Single    Spouse Name: N/A    Number of Children: 4  . Years of Education: 11th   Occupational History  . Not on file.   Social History Main Topics  . Smoking status: Former Smoker    Types: Cigarettes  . Smokeless tobacco: Never Used  . Alcohol Use: No  . Drug Use: No     Comment: past history of alcohol, cocaine and IV drug use  . Sexual Activity: Not Currently    Partners: Male     Comment: given condoms   Other Topics Concern  . Not on file   Social History Narrative   Patient  lives  at home with sister.    Patient is unemployed.    Patient is single.    Patient has 2 alive and 2 deceased.    Patient has 11th grade education.           Review of Systems: Full 14-point review of systems otherwise negative except as noted above in HPI.  Physical Exam:   There were no vitals filed for this visit.  General: Not in acute distress HEENT: PERRL, EOMI, scleral icterus, No JVD or bruit Cardiac: S1/S2, RRR, No murmurs, gallops or rubs Pulm: Good air movement bilaterally. Clear to auscultation bilaterally. No rales, wheezing, rhonchi or rubs. Abd: Soft, nondistended, nontender, no rebound pain, no organomegaly, BS present Ext: 3+ pitting leg edema bilaterally.  2+DP/PT pulse bilaterally Musculoskeletal: No joint deformities, erythema, or stiffness, ROM full Skin: No rashes. Has jaundice Neuro: Alert and oriented X3, cranial nerves II-XII grossly intact, muscle strength 5/5 in all extremeties, sensation to light touch intact. Brachial reflex 2+ bilaterally.  Psych: Patient is not psychotic, no suicidal or hemocidal ideation.  Lab results: Basic Metabolic Panel: No results found for this basename: NA, K, CL, CO2, GLUCOSE, BUN, CREATININE, CALCIUM, MG, PHOS,  in the last 72 hours Liver Function Tests: No results found for this basename: AST, ALT, ALKPHOS, BILITOT, PROT, ALBUMIN,  in the last 72 hours No results found for this basename: LIPASE, AMYLASE,  in the last 72 hours No results found for this basename: AMMONIA,  in the last 72 hours CBC: No results found for this basename: WBC, NEUTROABS, HGB, HCT, MCV, PLT,  in the last 72 hours Cardiac Enzymes: No results found for this basename: CKTOTAL, CKMB, CKMBINDEX, TROPONINI,  in the last 72 hours BNP: No results found for this basename: PROBNP,  in the last 72 hours D-Dimer: No results found for this basename: DDIMER,  in the last 72 hours CBG:  Recent Labs  06/18/13 0911 06/18/13 0931  GLUCAP 62* 53*    Hemoglobin A1C: No results found for this basename: HGBA1C,  in the last 72 hours Fasting Lipid Panel: No results found for this basename: CHOL, HDL, LDLCALC, TRIG, CHOLHDL, LDLDIRECT,  in the last 72 hours Thyroid Function Tests: No results found for this basename: TSH, T4TOTAL, FREET4, T3FREE, THYROIDAB,  in the last 72 hours Anemia Panel: No results found for this basename: VITAMINB12, FOLATE, FERRITIN, TIBC, IRON, RETICCTPCT,  in the last 72 hours Coagulation: No results found for this basename: LABPROT, INR,  in the last 72 hours Urine Drug Screen: Drugs of Abuse     Component Value Date/Time   LABOPIA POSITIVE* 05/28/2013 1737   COCAINSCRNUR NONE DETECTED 05/28/2013 1737   LABBENZ NONE DETECTED 05/28/2013 1737   AMPHETMU NONE DETECTED 05/28/2013 1737   THCU NONE DETECTED 05/28/2013 1737   LABBARB NONE DETECTED 05/28/2013 1737    Alcohol Level: No results found for this basename: ETH,  in the last 72 hours Urinalysis: No results found for this basename: COLORURINE, APPERANCEUR, LABSPEC, PHURINE, GLUCOSEU, HGBUR, BILIRUBINUR, KETONESUR, PROTEINUR, UROBILINOGEN, NITRITE, LEUKOCYTESUR,  in the last 72 hours Misc. Labs:  Imaging results:  No results found.  Other results: Assessment & Plan by Problem:  #: CHF EXACERBATION: Patient's symptoms of worsening leg edema, weight gain, shortness of breath are most likely caused by congestive heart failure exacerbation. Patient has possible nephrotic syndrome which may have also contributed to leg edema. Patient reports that she has been compliant to her home medications, including Lasix. The etiology is not  clear, but the recent prednisone use may have contributed to fluid retention.   - will admit to tele bed for observation - Lasix 40 mg bid IV - Troponin x 1 negative and given character of chest pain (relieved with deep breathing) not likely ACS.   - ECG, CXR to r/o ACS, PNA, PTX - continue home aspirin 81 mg daily, BB,  lisinopril.   #: jaundice, elev LFTs: AST (75), ALT (129), Alk Phos (145) newly elevated in setting of active HCV infection (genotype 1b). HCV is of course at the top of the differential, but patient is also on many medications that can cause liver damage and has multiple bottles of Phenytoin, lopinavir-ritonavir, and levetiracetam which may have resulted in unintentional overdosing. Less likely to be a biliary process given normal Tbili, benign RUQ exam, though recent hemolytic anemia would put her at risk for gallstones. Patient has had previous exposure to hepatitis A and has been vaccinated for hepatitis B. - changed home Norco to oxycodone without acetaminophen. - check keppra, dilantin, acetaminophen levels - daily CMET  #: HTN: 175/84 on admission. Will restart home meds and re-evaluate. - labetalol 100 mg BID - clonidine 0.2 mg BID - lisinopril 40 mg daily  #; T2DM/hypoglycemia: given recent episodes of symptomatic hypoglycemia and A1C 5.0 on 05/14/13, patient has been only diet controlled since discharge in December but apparently took 2x her usual dose of insulin this morning because she felt that she was behind on her insulin. CBG at admission 76 up from 81 in clinic. - patient currently asymptomatic - holding insulin - carb modified diet - daily CBGs  #: HIV: most recent CD4 was 490 and viral load <20 on 05/07/13. - continue home ART - lopinovir-ritonavir 200-50 mg per tablet, 2 tablets po BID - abacavir-lamivudine 600-300 mg one tablet po daily - acyclovir 800 mg daily   #: AIHA: diagnosed during recent admission.  Hgb stable 8.9-->9.0.  - continue prednisone taper: 40 mg daily today, then decrease to 20 mg daily, then re-evaluate with CBC, haptoglobin, LDH before discontinuing.  # Seizures: continue home meds - levetiracetam 1500 mg po Q12 - phenytoin 100 mg po TID - check keppra and dilantin levels  # CKD: Cre 0.9 on 06/02/13-->1.95 on admission. Her Cre ranged from  1.55-2.09 over the past two months. Likely some component of cardiorenal syndrome given CHF, fluid overload.   - limit new nephrotoxic meds. - will aggressively diurese while continuing to follow Cr.  #: Diarrhea: Ongoing for 1 day with previous episode 3 weeks ago. No recent antibiotics in outpatient setting but will check C. Difficile given recent hospitalization. . Patient attributes diarrhea to her medications and in absence of C. Diff this is a real possibility given the plethora and types of meds she is on. - C. Diff antigen pending. - enteric precautions - continue to monitor  #: Anxiety: patient reports feeling anxious about her current symptoms. - continue home lorazepam 1 mg daily   #  F/E/N  -SL:  -f/u electrolytes by checking BMP - Diet: Heart health diet  # DVT px: Heparin sq  Dispo: Disposition is deferred at this time, awaiting improvement of current medical problems. Anticipated discharge in approximately 1 to 2 day(s).   The patient does have a current PCP Algis Liming, Alinda Sierras, MD), therefore is requiring OPC follow-up after discharge.   The patient does not have transportation limitations that hinder transportation to clinic appointments.  Signed:  Ivor Costa, MD PGY3, Internal Medicine Teaching Service  Pager: [phone]  06/18/2013, 10:07 AM

## 2013-06-18 NOTE — H&P (Signed)
I have seen the patient and reviewed HPI by medical student and discussed the care of the patient with them. Please see my note for documentation of my findings, assessment, and plans  Lorretta Harp, MD PGY3, Internal Medicine Teaching Service Pager: [phone]

## 2013-06-18 NOTE — Progress Notes (Signed)
   Subjective:    Patient ID: Michelle [name], female    DOB: Jun 15, 1960, 53 y.o.   MRN: [id number]  HPI  Michelle [name] is a 53 yo woman pmh as listed below presenting for lower extremity edema follow up.   Pt was d/c on 06/02/13 for a complicated medical course for HTN emergency complicated by wrist infection and AIHA. Pt discharged BP was 180/80s after being admitted with BP in 210s/110s. Since that time pt has reported compliance with BP meds and steriods. Continues to have significant weight gain and significant LE edema that prevents ambulation. She continues to have DOE with slight movement around the house.   She had just started decreasing her prednisone but has now even noticed some weeping from her LE bilaterally.   In terms of her DM she is somnolent during interview and had eaten breakfast and doubled her insulin "because I think i was behind."    Review of Systems  Constitutional: Positive for appetite change (increase) and unexpected weight change (large weight gain especially in feet ). Negative for fever, chills, diaphoresis, activity change and fatigue.  HENT: Negative for congestion, nosebleeds and postnasal drip.   Eyes: Negative for visual disturbance.  Respiratory: Positive for shortness of breath. Negative for cough and chest tightness.   Cardiovascular: Positive for chest pain (intermittent) and leg swelling (dramatically worse over past 3 days). Negative for palpitations.  Gastrointestinal: Negative for nausea, vomiting, abdominal pain, diarrhea, constipation and abdominal distention.  Endocrine: Negative for polydipsia and polyuria.  Genitourinary: Negative for difficulty urinating.  Neurological: Negative for dizziness, seizures, syncope, facial asymmetry, speech difficulty, weakness, light-headedness and headaches.  Psychiatric/Behavioral: Positive for confusion.    Past Medical History  Diagnosis Date  . Seizures   . Stroke   . Meningitis   . Diabetes mellitus without  complication   . HIV (human immunodeficiency virus infection)   . Hypertension   . Gout   . Muscle spasms of head and/or neck   . CKD (chronic kidney disease)    Social, surgical, family history reviewed with patient and updated in appropriate chart locations.      Objective:   Physical Exam  Filed Vitals:   06/18/13 0850  BP: 144/73  Pulse: 51  Temp: 97.3 F (36.3 C)   General: sitting in wheelchair, NAD HEENT: PERRL, EOMI, no scleral icterus Cardiac: RRR, no rubs, murmurs or gallops Pulm: clear to auscultation bilaterally, no crackles, wheezes or rhonchi, moving normal volumes of air Abd: soft, nontender, nondistended, BS present Ext: warm and well perfused,3+ pedal edema to knees bilaterally Neuro: alert and oriented X3, cranial nerves II-XII grossly intact Psych: drowsy and slight inattention      Assessment & Plan:  Please see problem oriented charting  Pt discussed with Dr. Criselda Peaches

## 2013-06-18 NOTE — Progress Notes (Signed)
Report was called to Nurse on [facility].  Pt was transported via wheelchair to [facility].  Pt was alert at transport and accompanied by family members.  Angelina Ok, RN 06/18/2013 11:50 AM

## 2013-06-19 ENCOUNTER — Encounter (HOSPITAL_COMMUNITY): Payer: Self-pay | Admitting: General Practice

## 2013-06-19 ENCOUNTER — Inpatient Hospital Stay (HOSPITAL_COMMUNITY): Payer: Medicaid Other

## 2013-06-19 DIAGNOSIS — G8929 Other chronic pain: Secondary | ICD-10-CM

## 2013-06-19 DIAGNOSIS — R079 Chest pain, unspecified: Secondary | ICD-10-CM

## 2013-06-19 DIAGNOSIS — E872 Acidosis, unspecified: Secondary | ICD-10-CM

## 2013-06-19 DIAGNOSIS — N189 Chronic kidney disease, unspecified: Secondary | ICD-10-CM

## 2013-06-19 DIAGNOSIS — R609 Edema, unspecified: Secondary | ICD-10-CM

## 2013-06-19 DIAGNOSIS — R635 Abnormal weight gain: Secondary | ICD-10-CM

## 2013-06-19 DIAGNOSIS — R0609 Other forms of dyspnea: Secondary | ICD-10-CM

## 2013-06-19 DIAGNOSIS — R0602 Shortness of breath: Secondary | ICD-10-CM | POA: Diagnosis present

## 2013-06-19 DIAGNOSIS — R0989 Other specified symptoms and signs involving the circulatory and respiratory systems: Secondary | ICD-10-CM

## 2013-06-19 LAB — COMPREHENSIVE METABOLIC PANEL
ALT: 153 U/L — ABNORMAL HIGH (ref 0–35)
AST: 119 U/L — ABNORMAL HIGH (ref 0–37)
Albumin: 2.1 g/dL — ABNORMAL LOW (ref 3.5–5.2)
Alkaline Phosphatase: 169 U/L — ABNORMAL HIGH (ref 39–117)
BUN: 54 mg/dL — AB (ref 6–23)
CO2: 14 meq/L — AB (ref 19–32)
Calcium: 7.6 mg/dL — ABNORMAL LOW (ref 8.4–10.5)
Chloride: 107 mEq/L (ref 96–112)
Creatinine, Ser: 1.78 mg/dL — ABNORMAL HIGH (ref 0.50–1.10)
GFR, EST AFRICAN AMERICAN: 36 mL/min — AB (ref >90)
GFR, EST NON AFRICAN AMERICAN: 31 mL/min — AB (ref >90)
GLUCOSE: 236 mg/dL — AB (ref 70–99)
POTASSIUM: 4.1 meq/L (ref 3.7–5.3)
Sodium: 138 mEq/L (ref 137–147)
TOTAL PROTEIN: 5.4 g/dL — AB (ref 6.0–8.3)
Total Bilirubin: 0.3 mg/dL (ref 0.3–1.2)

## 2013-06-19 LAB — LEVETIRACETAM LEVEL: LEVETIRACETAM: 69.6 ug/mL — AB (ref 5.0–30.0)

## 2013-06-19 LAB — GLUCOSE, CAPILLARY
GLUCOSE-CAPILLARY: 377 mg/dL — AB (ref 70–99)
Glucose-Capillary: 207 mg/dL — ABNORMAL HIGH (ref 70–99)
Glucose-Capillary: 228 mg/dL — ABNORMAL HIGH (ref 70–99)

## 2013-06-19 LAB — CREATININE, SERUM
Creatinine, Ser: 1.73 mg/dL — ABNORMAL HIGH (ref 0.50–1.10)
GFR calc Af Amer: 37 mL/min — ABNORMAL LOW (ref >90)
GFR calc non Af Amer: 32 mL/min — ABNORMAL LOW (ref >90)

## 2013-06-19 LAB — BUN: BUN: 51 mg/dL — AB (ref 6–23)

## 2013-06-19 LAB — LACTIC ACID, PLASMA: Lactic Acid, Venous: 1.9 mmol/L (ref 0.5–2.2)

## 2013-06-19 LAB — SALICYLATE LEVEL: Salicylate Lvl: <2 mg/dL — ABNORMAL LOW (ref 2.8–20.0)

## 2013-06-19 MED ORDER — INSULIN GLARGINE 100 UNIT/ML ~~LOC~~ SOLN
10.0000 [IU] | Freq: Every day | SUBCUTANEOUS | Status: DC
  Administered 2013-06-19 – 2013-06-20 (×2): 10 [IU] via SUBCUTANEOUS
  Filled 2013-06-19 (×2): qty 0.1

## 2013-06-19 MED ORDER — INSULIN ASPART 100 UNIT/ML ~~LOC~~ SOLN
0.0000 [IU] | SUBCUTANEOUS | Status: DC
  Administered 2013-06-19 (×2): 3 [IU] via SUBCUTANEOUS
  Administered 2013-06-19: 9 [IU] via SUBCUTANEOUS
  Administered 2013-06-20: 3 [IU] via SUBCUTANEOUS
  Administered 2013-06-20 (×2): 2 [IU] via SUBCUTANEOUS
  Administered 2013-06-20: 7 [IU] via SUBCUTANEOUS

## 2013-06-19 MED ORDER — DARUNAVIR ETHANOLATE 800 MG PO TABS
800.0000 mg | ORAL_TABLET | Freq: Every day | ORAL | Status: DC
  Administered 2013-06-19 – 2013-06-20 (×2): 800 mg via ORAL
  Filled 2013-06-19 (×4): qty 1

## 2013-06-19 MED ORDER — RITONAVIR 100 MG PO TABS
100.0000 mg | ORAL_TABLET | Freq: Every day | ORAL | Status: DC
  Administered 2013-06-19 – 2013-06-20 (×2): 100 mg via ORAL
  Filled 2013-06-19 (×3): qty 1

## 2013-06-19 MED ORDER — INSULIN GLARGINE 100 UNIT/ML ~~LOC~~ SOLN
20.0000 [IU] | Freq: Every day | SUBCUTANEOUS | Status: DC
  Filled 2013-06-19: qty 0.2

## 2013-06-19 MED ORDER — HYDRALAZINE HCL 20 MG/ML IJ SOLN: 5.0000 mg | INTRAMUSCULAR | Status: DC | PRN

## 2013-06-19 MED ORDER — LAMIVUDINE 150 MG PO TABS
150.0000 mg | ORAL_TABLET | Freq: Every day | ORAL | Status: DC
  Administered 2013-06-19 – 2013-06-20 (×2): 150 mg via ORAL
  Filled 2013-06-19 (×2): qty 1

## 2013-06-19 NOTE — Progress Notes (Signed)
Subjective and key labs;  - patient feels much better. Her leg swelling has improved after diuresed 2.8 L. Still has mild SOB. No chest pain, fever or chills.  - No more diarrhea - I/O: negative 2750 ml since admission - Cre 1.95-->1.78 and K 4.1,  - [place] level high 69.6 and phenytoin level low 3.8 -Anion Gap 17; lactic acid 1.9; acetaminophen level <82; salicylate level <4.2 - Bp still elevated.   Objective: Vital signs in last 24 hours: Filed Vitals:   06/18/13 2242 06/19/13 0555 06/19/13 1037 06/19/13 1626  BP: 173/88 189/98 175/89 179/86  Pulse: 72 64  62  Temp: 98.5 F (36.9 C) 98.6 F (37 C)  97.8 F (36.6 C)  TempSrc: Oral Oral  Oral  Resp: _0 Height:  _1  (1.651 m)    Weight:  204 lb 5.9 oz (92.7 kg)    SpO2: 100% 100%  100%   Weight change:   Intake/Output Summary (Last 24 hours) at 06/19/13 1641 Last data filed at 06/19/13 1406  Gross per 24 hour  Intake      0 ml  Output   2750 ml  Net  -2750 ml   General: Not in acute distress. HEENT: PERRL, EOMI, scleral icterus, No JVD or bruit Cardiac: S1/S2, RRR, No murmurs, gallops or rubs Pulm: Good air movement bilaterally. Clear to auscultation bilaterally. No rales, wheezing, rhonchi or rubs. Abd: Soft, nondistended, nontender, no rebound pain, no organomegaly, BS present Ext: 3+ pitting leg edema bilaterally.  2+DP/PT pulse bilaterally Musculoskeletal: No joint deformities, erythema, or stiffness, ROM full Skin: No rashes.  Neuro: Alert and oriented X3, cranial nerves II-XII grossly intact, muscle strength 5/5 in all extremeties, sensation to light touch intact. Brachial reflex 2+ bilaterally.   Psych: Patient is not psychotic, no suicidal or hemocidal ideation.  Lab Results: Basic Metabolic Panel:  Recent Labs Lab 06/18/13 1210 06/19/13 0500  NA 141 138  K 3.4* 4.1  CL 110 107  CO2 15* 14*  GLUCOSE 76 236*  BUN 60* 54*  CREATININE 1.95* 1.78*  CALCIUM 7.8* 7.6*  MG 1.6  --    Liver  Function Tests:  Recent Labs Lab 06/18/13 1210 06/19/13 0500  AST 75* 119*  ALT 129* 153*  ALKPHOS 145* 169*  BILITOT 0.3 0.3  PROT 5.6* 5.4*  ALBUMIN 2.2* 2.1*   No results found for this basename: LIPASE, AMYLASE,  in the last 168 hours No results found for this basename: AMMONIA,  in the last 168 hours CBC:  Recent Labs Lab 06/18/13 1210  WBC 7.3  NEUTROABS 4.9  HGB 8.9*  HCT 25.9*  MCV 100.0  PLT 152   Cardiac Enzymes:  Recent Labs Lab 06/18/13 1210  TROPONINI <0.30   BNP:  Recent Labs Lab 06/18/13 1210  PROBNP 374.8*   D-Dimer: No results found for this basename: DDIMER,  in the last 168 hours CBG:  Recent Labs Lab 06/18/13 0911 06/18/13 0931 06/18/13 1007 06/18/13 1159 06/18/13 1710 06/19/13 1152  GLUCAP 62* 53* 72 92 146* 377*   Hemoglobin A1C: No results found for this basename: HGBA1C,  in the last 168 hours Fasting Lipid Panel: No results found for this basename: CHOL, HDL, LDLCALC, TRIG, CHOLHDL, LDLDIRECT,  in the last 168 hours Thyroid Function Tests: No results found for this basename: TSH, T4TOTAL, FREET4, T3FREE, THYROIDAB,  in the last 168 hours Coagulation:  Recent Labs Lab 06/18/13 1210  LABPROT 12.9  INR 0.99   Anemia Panel:  No results found for this basename: VITAMINB12, FOLATE, FERRITIN, TIBC, IRON, RETICCTPCT,  in the last 168 hours Urine Drug Screen: Drugs of Abuse     Component Value Date/Time   LABOPIA POSITIVE* 05/28/2013 1737   COCAINSCRNUR NONE DETECTED 05/28/2013 1737   LABBENZ NONE DETECTED 05/28/2013 1737   AMPHETMU NONE DETECTED 05/28/2013 1737   THCU NONE DETECTED 05/28/2013 1737   LABBARB NONE DETECTED 05/28/2013 1737    Alcohol Level: No results found for this basename: ETH,  in the last 168 hours Urinalysis: No results found for this basename: COLORURINE, APPERANCEUR, LABSPEC, PHURINE, GLUCOSEU, HGBUR, BILIRUBINUR, KETONESUR, PROTEINUR, UROBILINOGEN, NITRITE, LEUKOCYTESUR,  in the last 168  hours Misc. Labs:  Micro Results: No results found for this or any previous visit (from the past 240 hour(s)). Studies/Results: X-[facility] Chest Pa And Lateral   06/18/2013   ADDENDUM REPORT: 06/18/2013 22:32  ADDENDUM: There is a questionable 1 cm nodular density along the anterior lower chest on the lateral view. This could be contributing to the nodular density on the frontal view but may not account for the entire density. A chest CT would be most definitive to exclude a pulmonary nodule.   Electronically Signed   By: Markus Daft M.D.   On: 06/18/2013 22:32   06/18/2013   CLINICAL DATA:  Chest pain and shortness of breath.  EXAM: CHEST  2 VIEW  COMPARISON:  05/10/2013  FINDINGS: Two views of the chest were obtained. This is a 2.1 cm nodular structure in the retrocardiac space on the frontal view. However, a nodule is not clearly identified on the lateral view. This could represent overlying shadows. Heart size is normal. Lungs are clear without airspace disease or edema. There is probably mild basilar atelectasis. No evidence for pleural effusions.  IMPRESSION: No acute chest findings.  Indeterminate 2.1 cm nodular structure in the retrocardiac region on the frontal view. However, there is not a correlating lesion on the lateral view and this may represent overlying structures. Recommend a follow-up two view chest radiograph to confirm that this represents overlying structures rather than a true nodule.  Electronically Signed: By: Markus Daft M.D. On: 06/18/2013 22:07   Ct Chest Wo Contrast  06/19/2013   CLINICAL DATA:  Pulmonary nodule.  EXAM: CT CHEST WITHOUT CONTRAST  TECHNIQUE: Multidetector CT imaging of the chest was performed following the standard protocol without IV contrast.  COMPARISON:  Chest x-Limb dated 06/18/2013  FINDINGS: There is a 25 x 17 x 18 mm poorly defined cavitary lesion in the left lower lobe with peripheral linear atelectasis extending from the area of the lesion. There is no hilar  or mediastinal adenopathy. There is slight linear atelectasis at the right lung base. Heart size is normal. There appears to be a coronary artery stent in place.  No osseous abnormality.  The visualized portion of the upper abdomen is normal.  IMPRESSION: 2.5 cm cavitary poorly defined nodular density in the left lower lobe. I suspect this represents atypical infection in this patient with HIV disease.  Malignancy is felt to be less likely.   Electronically Signed   By: Rozetta Nunnery M.D.   On: 06/19/2013 16:34   US Abdomen Complete  06/19/2013   CLINICAL DATA:  Abdominal pain, elevated LFTs  EXAM: ULTRASOUND ABDOMEN COMPLETE  COMPARISON:  05/28/2003  FINDINGS: Gallbladder:  No gallstones, gallbladder wall thickening, or pericholecystic fluid. Negative sonographic Murphy's sign.  Common bile duct:  Diameter: 4 mm.  Liver:  Mildly nodular hepatic contour (for  example, image 19). No focal hepatic lesion is seen.  IVC:  No abnormality visualized.  Pancreas:  Visualized portions are unremarkable.  Spleen:  Measures 6.2 cm.  Right Kidney:  Length: 12.3 cm.  No mass or hydronephrosis.  Left Kidney:  Length: 11.6 cm.  No mass or hydronephrosis.  Abdominal aorta:  No aneurysm visualized.  Other findings:  None.  IMPRESSION: Mildly nodular hepatic contour, raising the possibility of cirrhosis.  Otherwise, negative abdominal ultrasound.   Electronically Signed   By: Julian Hy M.D.   On: 06/19/2013 16:08   Medications:  Scheduled Meds: . abacavir  600 mg Oral Daily  . acyclovir  800 mg Oral Daily  . aspirin EC  81 mg Oral Daily  . cholecalciferol  1,000 Units Oral Daily  . cloNIDine  0.2 mg Oral BID  . darunavir  800 mg Oral Q breakfast  . furosemide  40 mg Intravenous BID  . heparin  5,000 Units Subcutaneous Q8H  . insulin aspart  0-9 Units Subcutaneous Q4H  . insulin glargine  10 Units Subcutaneous Daily  . labetalol  100 mg Oral BID  . lamiVUDine  150 mg Oral Daily  . levETIRAcetam  1,500 mg Oral  Q12H  . lisinopril  40 mg Oral Daily  . LORazepam  1 mg Oral Daily  . methocarbamol  1,000 mg Oral BID  . pantoprazole  40 mg Oral Daily  . phenytoin  100 mg Oral TID  . predniSONE  40 mg Oral Q breakfast  . ritonavir  100 mg Oral Q breakfast  . sodium chloride  3 mL Intravenous Q12H  . sodium chloride  3 mL Intravenous Q12H   Continuous Infusions:  PRN Meds:.sodium chloride, oxyCODONE, sodium chloride, triamcinolone cream Assessment/Plan:  #: CHF EXACERBATION: Patient's symptoms of worsening leg edema, weight gain, shortness of breath are most likely caused by congestive heart failure exacerbation.  Patient has possible nephrotic syndrome which may have also contributed to leg edema. Patient reports that she has been compliant to her home medications, including Lasix. The etiology is not clear, but the recent prednisone use may have contributed to fluid retention. Her lasix dose was increased from 40 mg oral daily to 40 mg bid IV. She has been well diuresed well with negative I/O of 2.7 L. Her Cre improved slightly 1.95-->1.78. Patient still has significant leg edema (3+pitting edema). Trop negative x 1.   - will continue Lasix 40 mg bid IV  - continue home aspirin 81 mg daily, BB, lisinopril, clonidine - will obtain repeat echo given only diastolic dysfunction on 4/0/08 echo, clinical suspicion for worsening myocardial function given volume overload  #: Anion gap metabolic acidosis: Unclear etiology. Uremia seems to be the most likely cause given high BUN in setting of acute on CKD. Mild DKA is also possible, but her blood glucose level was low which is not consistent with DKA. Patient has normal salicylate and lactic acid levels. Methanol and ethylene glycol poisoning unlikely and patient does not currently drink alcohol. Pyruglutamic acid (5-oxoproline) toxicity can not completely ruled out given her chronic acetaminophen (Norco) use in the setting of malnutrition. - check serum osmolality  to calculate osmolal gap - continue diuresis to improve cardiac ouput -> renal perfusion, and limit new nephrotoxic meds  #: Elevated LFTs: most likely related to HCV.  AST (75->119), ALT (129->153), Alk Phos (145->169) newly elevated in setting of chronic HCV infection (genotype 1b). HCV is of course at the top of the differential, but patient  is also on many medications that can cause liver damage and has multiple bottles of Phenytoin, lopinavir-ritonavir, and levetiracetam which may have resulted in unintentional overdosing. Further, given patient's fluid overloaded status and known CHF, congestive hepatopathy is a consideration and may take a few days to improve with diuresis. Acetaminophen, phenytoin levels low. Keppra level high but not supratherapeutic per pharmacy (no true therapeutic target range). Less likely to be a biliary process given normal Tbili, benign RUQ exam, though recent hemolytic anemia would put her at risk for gallstones. Patient has had previous exposure to hepatitis A and has been vaccinated for hepatitis B.    - changed home Norco to oxycodone without acetaminophen.   - daily CMET   - RUQ ultrasound given new pain on exam today.  #: HTN: blood pressure still elevated at 175/89. Restart home meds on admission.  - labetalol 100 mg BID   - clonidine 0.2 mg BID   - lisinopril 40 mg daily  - will add hydralazine 5 mg IV prn for SBP>185 mmHg.   #: Pulmonary Nodule on CXR: >1.0 cm, and has been present on prior films. Will obtain further imaging to assess the nature of this lesion. - noncontrast Chest CT  #: T2DM/hypoglycemia: given recent episodes of symptomatic hypoglycemia and A1C 5.0 on 05/14/13, patient has been only diet controlled since discharge in December but apparently took 2x her usual dose of insulin this morning because she felt that she was behind on her insulin. CBG elevated at 236 in AM.  -start lantus 10 U daily -SSI-sensitive   #: HIV: most recent CD4  was 490 and viral load <20 on 05/07/13.  - continue home ART - lopinovir-ritonavir 200-50 mg per tablet, 2 tablets po BID   - abacavir-lamivudine 600-300 mg one tablet po daily   - acyclovir 800 mg daily    #: AIHA: diagnosed during recent admission. Hgb stable 8.9-->9.0.  - continue prednisone taper: 40 mg daily today, then decrease to 20 mg daily, then re-evaluate with CBC, haptoglobin, LDH before discontinuing.  #: Seizures: continue home meds. Phenytoin level low (3.8) but with low albumin, corrects to 7.3 per clinical pharmacist. No true therapeutic target range for Keppra per clinical pharmacist. Will continue home dosing but will consolidate phenytoin to encourage adherence. - levetiracetam 1500 mg po Q12   - phenytoin 100 mg po TID --> change to 300 mg po QHS  #: CKD: Cr improved 1.95--> 1.78 today. Likely some component of cardiorenal syndrome given CHF, fluid overload.   - limit new nephrotoxic meds.   - will aggressively diurese while continuing to follow Cr.   - Checking FeUrea to assess etiology of AKI  #: Diarrhea: Patient c/o diarrhea yesterday but no episodes today so C. Diff has not been collected.   - C. Diff antigen pending.   - enteric precautions   - continue to monitor    # Chronic pain:  - home methocarbamol 100 mg BID   - [place] changed to oxycodone 10 mg po PRN severe pain    #: Anxiety: patient reports feeling anxious about her current symptoms.  - continue home lorazepam 1 mg daily    FEN/GI: patient currently fluid overloaded and with low BG. Low albumin and total protein - reflective of likely protein-calorie malnutrition.   - carb modified diet   - holding IV fluids for now   - continue home vitamin D supplementation   - pantoprazole 40 mg daily for GERD  PPx:  - subcutaneous heparin 5000 units TID      LOS: 1 day   Ivor Costa 06/19/2013, 4:41 PM

## 2013-06-19 NOTE — Progress Notes (Signed)
Ur completed. Isidoro Donning RN CCM Case Mgmt

## 2013-06-19 NOTE — H&P (Signed)
Internal Medicine Attending Admission Note Date: 06/19/2013  Patient name: Michelle [name] Medical record number: [id number] Date of birth: 11/09/1960 Age: 53 y.o. Gender: female  I saw and evaluated the patient. I reviewed the resident's note and I agree with the resident's findings and plan as documented in the resident's note, with the following additional comments.  Chief Complaint(s): Swelling of legs  History - key components related to admission: Patient is a 53 year old woman with history of diastolic congestive heart failure (normal systolic function and grade 2 diastolic dysfunction by 2-D echocardiogram done 05/31/2013), HIV disease, seizures, gout, chronic kidney disease, CVA, autoimmune hemolytic anemia, pulmonary hypertension, and other problems as outlined in the medical history, admitted with a one-week history of worsening bilateral lower extremity edema and recent weight gain.  Physical Exam - key components related to admission:  Filed Vitals:   06/18/13 1806 06/18/13 2242 06/19/13 0555 06/19/13 1037  BP: 187/93 173/88 189/98 175/89  Pulse: 53 72 64   Temp:  98.5 F (36.9 C) 98.6 F (37 C)   TempSrc:  Oral Oral   Resp:  18 18   Weight:   204 lb 5.9 oz (92.7 kg)   SpO2:  100% 100%    General: Alert, no distress Lungs: Clear Heart: Regular; S1-S2, no S3; 2/6 systolic ejection murmur Abdomen: Bowel sounds present, soft; moderate right upper quadrant tenderness Extremities: 4+ bilateral pitting lower extremity edema   Lab results:   Basic Metabolic Panel:  Recent Labs  82/95/6201/20/15 1210 06/19/13 0500  NA 141 138  K 3.4* 4.1  CL 110 107  CO2 15* 14*  GLUCOSE 76 236*  BUN 60* 54*  CREATININE 1.95* 1.78*  CALCIUM 7.8* 7.6*  MG 1.6  --     Liver Function Tests:  Recent Labs  06/18/13 1210 06/19/13 0500  AST 75* 119*  ALT 129* 153*  ALKPHOS 145* 169*  BILITOT 0.3 0.3  PROT 5.6* 5.4*  ALBUMIN 2.2* 2.1*     CBC:  Recent Labs  06/18/13 1210  WBC  7.3  HGB 8.9*  HCT 25.9*  MCV 100.0  PLT 152    Recent Labs  06/18/13 1210  NEUTROABS 4.9  LYMPHSABS 1.6  MONOABS 0.8  EOSABS 0.0  BASOSABS 0.0    Cardiac Enzymes:  Recent Labs  06/18/13 1210  TROPONINI <0.30    BNP:  Recent Labs  06/18/13 1210  PROBNP 374.8*     CBG:  Recent Labs  06/18/13 0911 06/18/13 0931 06/18/13 1007 06/18/13 1159 06/18/13 1710 06/19/13 1152  GLUCAP 62* 53* 72 92 146* 377*      Coagulation:  Recent Labs  06/18/13 1210  INR 0.99     Imaging results:  X-[facility] Chest Pa And Lateral   06/18/2013   ADDENDUM REPORT: 06/18/2013 22:32  ADDENDUM: There is a questionable 1 cm nodular density along the anterior lower chest on the lateral view. This could be contributing to the nodular density on the frontal view but may not account for the entire density. A chest CT would be most definitive to exclude a pulmonary nodule.   Electronically Signed   By: Richarda OverlieAdam  Henn M.D.   On: 06/18/2013 22:32   06/18/2013   CLINICAL DATA:  Chest pain and shortness of breath.  EXAM: CHEST  2 VIEW  COMPARISON:  05/10/2013  FINDINGS: Two views of the chest were obtained. This is a 2.1 cm nodular structure in the retrocardiac space on the frontal view. However, a nodule is not clearly identified  on the lateral view. This could represent overlying shadows. Heart size is normal. Lungs are clear without airspace disease or edema. There is probably mild basilar atelectasis. No evidence for pleural effusions.  IMPRESSION: No acute chest findings.  Indeterminate 2.1 cm nodular structure in the retrocardiac region on the frontal view. However, there is not a correlating lesion on the lateral view and this may represent overlying structures. Recommend a follow-up two view chest radiograph to confirm that this represents overlying structures rather than a true nodule.  Electronically Signed: By: Richarda Overlie M.D. On: 06/18/2013 22:07    Other results: EKG:  Pending   Assessment & Plan by Problem:  1.  Marked bilateral lower extremity edema likely due to congestive heart failure exacerbation.  Patient apparently had no leg edema at the time of her recent hospital discharge; her weight has also increased substantially since then.  Patient reports that she is compliant with her medications; the cause of her decompensation is not clear.  Her albumin is low, and there may be an element of third spacing.  The plan is to diurese; follow in/outs, electrolytes, and renal function closely; monitor; get 2-D echocardiogram to assess left ventricular function and valves; treat hypertension; check urine microalbumin/creatinine ratio and if elevated collect 24 hour urine for total protein.  2.  Elevated liver enzymes.  Patient has elevated transaminases and alkaline phosphatase; this may be due to hepatic congestion, hepatitis C, or other cause.  Would ask pharmacy to review her medications for possible drug effect; given right upper quadrant tenderness, get ultrasound; follow liver panel.  3.  Hypertension.  Systolic blood pressure is moderately elevated; plan is to continue home medications and adjust regimen as indicated.  4.  Other problems and plans as per the resident physician's note.

## 2013-06-19 NOTE — Progress Notes (Signed)
I have seen the patient and reviewed the daily progress note by medical student  and discussed the care of the patient with them. Please see my note for documentation of my findings, assessment, and plans  Shaya Altamura, MD PGY3, Internal Medicine Teaching Service Pager: [phone]   

## 2013-06-19 NOTE — Progress Notes (Signed)
Case discussed with Dr. Sadek soon after the resident saw the patient.  We reviewed the resident's history and exam and pertinent patient test results.  I agree with the assessment, diagnosis, and plan of care documented in the resident's note. 

## 2013-06-19 NOTE — Progress Notes (Signed)
Medical Student Daily Progress Note  53 year old woman with a PMH of CHF, HIV, chronic HCV, seizure disorder, stroke, HTN, T2DM, CKD, gout, and AIHA admitted for LE edema and weight gain worsening x 4-5 days.   Subjective:  Overnight, patient was hypertensive in 846N-629B systolic without acute symptoms or evidence of end organ damage. She received home meds without dramatic improvement.   Patient reports feeling "blessed" i.e. Much better today. She feels her swelling has improved quite a bit though she still complains of mild shortness of breath. She has a mild headache that is just like her usual headaches. She has not had any more diarrhea since yesterday.    Objective: Vital signs in last 24 hours: Filed Vitals:   06/18/13 1806 06/18/13 2242 06/19/13 0555 06/19/13 1037  BP: 187/93 173/88 189/98 175/89  Pulse: 53 72 64   Temp:  98.5 F (36.9 C) 98.6 F (37 C)   TempSrc:  Oral Oral   Resp:  18 18   Weight:   92.7 kg (204 lb 5.9 oz)   SpO2:  100% 100%      Weight change:    Intake/Output Summary (Last 24 hours) at 06/19/13 1314 Last data filed at 06/19/13 1205  Gross per 24 hour  Intake      0 ml  Output   2200 ml  Net  -2200 ml    Physical Exam: GENERAL: sitting up on side of bed, singing/praying, in NAD.  NEURO: alert and oriented. Cooperative with exam. Some confusion about plan of care but mental status unchanged from admission.  HEENT:  Scleral icterus noted.  CHEST: Lungs CTAB with normal WOB on room air. RRR with II/VI SEM; no gallops or rubs appreciated today.  ABDOMEN: Obese, moderately distended with mild fluid wave and shifting dullness. Mild tenderness to palpation throughout. EXTREMITIES: 4+ pitting edema to knees bilaterally with weeping fluid and small area of superficial skin breakdown on L shin; edema slightly improved from yesterday.    Lab Results: Results for orders placed during the hospital encounter of 06/18/13 (from the past 24 hour(s))   GLUCOSE, CAPILLARY     Status: Abnormal   Collection Time    06/18/13  5:10 PM      Result Value Range   Glucose-Capillary 146 (*) 70 - 99 mg/dL  LEVETIRACETAM LEVEL     Status: Abnormal   Collection Time    06/18/13  6:03 PM      Result Value Range   Levetiracetam Lvl 69.6 (*) 5.0 - 30.0 ug/mL  PHENYTOIN LEVEL, TOTAL     Status: Abnormal   Collection Time    06/18/13  6:03 PM      Result Value Range   Phenytoin Lvl 3.8 (*) 10.0 - 20.0 ug/mL  ACETAMINOPHEN LEVEL     Status: None   Collection Time    06/18/13  6:03 PM      Result Value Range   Acetaminophen (Tylenol), Serum <15.0  10 - 30 ug/mL  COMPREHENSIVE METABOLIC PANEL     Status: Abnormal   Collection Time    06/19/13  5:00 AM      Result Value Range   Sodium 138  137 - 147 mEq/L   Potassium 4.1  3.7 - 5.3 mEq/L   Chloride 107  96 - 112 mEq/L   CO2 14 (*) 19 - 32 mEq/L   Glucose, Bld 236 (*) 70 - 99 mg/dL   BUN 54 (*) 6 - 23 mg/dL  Creatinine, Ser 1.78 (*) 0.50 - 1.10 mg/dL   Calcium 7.6 (*) 8.4 - 10.5 mg/dL   Total Protein 5.4 (*) 6.0 - 8.3 g/dL   Albumin 2.1 (*) 3.5 - 5.2 g/dL   AST 119 (*) 0 - 37 U/L   ALT 153 (*) 0 - 35 U/L   Alkaline Phosphatase 169 (*) 39 - 117 U/L   Total Bilirubin 0.3  0.3 - 1.2 mg/dL   GFR calc non Af Amer 31 (*) >90 mL/min   GFR calc Af Amer 36 (*) >90 mL/min  SALICYLATE LEVEL     Status: Abnormal   Collection Time    06/19/13 11:10 AM      Result Value Range   Salicylate Lvl <2.0 (*) 2.8 - 20.0 mg/dL  LACTIC ACID, PLASMA     Status: None   Collection Time    06/19/13 11:20 AM      Result Value Range   Lactic Acid, Venous 1.9  0.5 - 2.2 mmol/L  GLUCOSE, CAPILLARY     Status: Abnormal   Collection Time    06/19/13 11:52 AM      Result Value Range   Glucose-Capillary 377 (*) 70 - 99 mg/dL   Comment 1 Documented in Chart     Comment 2 Notify RN      Micro Results: No results found for this or any previous visit (from the past 240 hour(s)).  Studies/Results: X-Ingrum Chest Pa  And Lateral   06/18/2013   ADDENDUM REPORT: 06/18/2013 22:32  ADDENDUM: There is a questionable 1 cm nodular density along the anterior lower chest on the lateral view. This could be contributing to the nodular density on the frontal view but may not account for the entire density. A chest CT would be most definitive to exclude a pulmonary nodule.   Electronically Signed   By: Adam  Henn M.D.   On: 06/18/2013 22:32   06/18/2013   CLINICAL DATA:  Chest pain and shortness of breath.  EXAM: CHEST  2 VIEW  COMPARISON:  05/10/2013  FINDINGS: Two views of the chest were obtained. This is a 2.1 cm nodular structure in the retrocardiac space on the frontal view. However, a nodule is not clearly identified on the lateral view. This could represent overlying shadows. Heart size is normal. Lungs are clear without airspace disease or edema. There is probably mild basilar atelectasis. No evidence for pleural effusions.  IMPRESSION: No acute chest findings.  Indeterminate 2.1 cm nodular structure in the retrocardiac region on the frontal view. However, there is not a correlating lesion on the lateral view and this may represent overlying structures. Recommend a follow-up two view chest radiograph to confirm that this represents overlying structures rather than a true nodule.  Electronically Signed: By: Adam  Henn M.D. On: 06/18/2013 22:07    Medications:  Scheduled Meds: . abacavir  600 mg Oral Daily  . acyclovir  800 mg Oral Daily  . aspirin EC  81 mg Oral Daily  . cholecalciferol  1,000 Units Oral Daily  . cloNIDine  0.2 mg Oral BID  . darunavir  800 mg Oral Q breakfast  . furosemide  40 mg Intravenous BID  . heparin  5,000 Units Subcutaneous Q8H  . insulin aspart  0-9 Units Subcutaneous Q4H  . insulin glargine  10 Units Subcutaneous Daily  . labetalol  100 mg Oral BID  . lamiVUDine  150 mg Oral Daily  . levETIRAcetam  1,500 mg Oral Q12H  . lisinopril  40   mg Oral Daily  . LORazepam  1 mg Oral Daily  .  methocarbamol  1,000 mg Oral BID  . pantoprazole  40 mg Oral Daily  . phenytoin  100 mg Oral TID  . predniSONE  40 mg Oral Q breakfast  . ritonavir  100 mg Oral Q breakfast  . sodium chloride  3 mL Intravenous Q12H  . sodium chloride  3 mL Intravenous Q12H   Continuous Infusions:  PRN Meds:.sodium chloride, oxyCODONE, sodium chloride, triamcinolone cream  Assessment/Plan:  LE Edema, weight gain, dyspnea on exertion with mild chest pain, may be 2/2 CHF exacerbation with cardiorenal syndrome, vs nephrotic syndrome given recent slight elevation of creatinine. Differentiating between nephrotic syndrome and CHF causing her volume overload is not likely to change our management at this stage, the centerpiece of which will be aggressive diuresis. In terms of etiology, patient has been on prednisone taper with increased appetite which may have led to volume overload. Troponin x 1 negative and given character of chest pain (relieved with deep breathing) not likely ACS.  - ECG still pending. - will obtain repeat echo given only diastolic dysfunction on 05/31/13 echo, clinical suspicion for worsening myocardial function given volume overload - aspirin 81 mg daily  - furosemide 40 mg IV BID (home dose is 40 mg po daily); will continue aggressive diuresis until patient's edema and shortness of breath improve.   Anion gap metabolic acidosis. Patient with normal salicylate, lactic acid levels. Methanol and ethylene glycol poisoning unlikely and patient does not currently drink alcohol. Given BG on admission (and acidosis present at that time), DKA unlikely. Uremia seems to be the most likely cause given high BUN in setting of AKI. Pyruglutamic acid (5-oxoproline) toxicity may also be contributing in this woman with chronic acetaminophen (Norco) use in the setting of malnutrition. - check FeUrea (BUN, Cr, urine Cr, urine urea) - check serum osmolality to calculate osmolal gap - continue diuresis to improve  cardiac ouput -> renal perfusion, and limit new nephrotoxic meds  Jaundice, elev LFTs, HCV+. AST (75->119), ALT (129->153), Alk Phos (145->169) newly elevated in setting of chronic HCV infection (genotype 1b). HCV is of course at the top of the differential, but patient is also on many medications that can cause liver damage and has multiple bottles of Phenytoin, lopinavir-ritonavir, and levetiracetam which may have resulted in unintentional overdosing. Further, given patient's fluid overloaded status and known CHF, congestive hepatopathy is a consideration and may take a few days to improve with diuresis. Acetaminophen, phenytoin levels low. Keppra level high but not supratherapeutic per pharmacy (no true therapeutic target range). Less likely to be a biliary process given normal Tbili, benign RUQ exam, though recent hemolytic anemia would put her at risk for gallstones. Patient has had previous exposure to hepatitis A and has been vaccinated for hepatitis B.  - changed home Norco to oxycodone without acetaminophen.  - daily CMET  - RUQ ultrasound given new pain on exam today.  HTN: 175/84 on admission. Will restart home meds and re-evaluate.  - labetalol 100 mg BID  - clonidine 0.2 mg BID  - lisinopril 40 mg daily   Pulmonary Nodule on CXR: >1.0 cm, and has been present on prior films. Will obtain further imaging to assess the nature of this lesion. - noncontrast Chest CT  T2DM/hypoglycemia: given recent episodes of symptomatic hypoglycemia and A1C 5.0 on 05/14/13, patient has been only diet controlled since discharge in December but apparently took 2x her usual dose of insulin this   morning because she felt that she was behind on her insulin. CBG at admission 76 up from 63 in clinic.  - patient currently asymptomatic  - holding insulin  - carb modified diet  - daily CBGs   HIV: most recent viral load on 12/9 was undetectable.  - continue home ART - lopinovir-ritonavir 200-50 mg per tablet, 2  tablets po BID  - abacavir-lamivudine 600-300 mg one tablet po daily  - acyclovir 800 mg daily   AIHA: diagnosed during recent admission.  - continue prednisone taper: 60 mg daily today, then decrease to 40 mg daily tomorrow (1/21), then decrease to 20 mg daily on 1/28 x 30 days, then re-evaluate with CBC, haptoglobin, LDH before discontinuing.   Seizures: continue home meds. Phenytoin level low (3.8) but with low albumin, corrects to 7.3 per clinical pharmacist. No true therapeutic target range for Keppra per clinical pharmacist. Will continue home dosing but will consolidate phenytoin to encourage adherence. - levetiracetam 1500 mg po Q12  - phenytoin 100 mg po TID --> change to 300 mg po QHS  CKD: Cr improved to 1.78 today. Likely some component of cardiorenal syndrome given CHF, fluid overload.  - limit new nephrotoxic meds.  - will aggressively diurese while continuing to follow Cr.  - Checking FeUrea to assess etiology of AKI  Diarrhea: Patient c/o diarrhea yesterday but no episodes today so C. Diff has not been collected.  - C. Diff antigen pending.  - enteric precautions  - continue to monitor   Chronic pain:  - home methocarbamol 100 mg BID  - Home Norco changed to oxycodone 10 mg po PRN severe pain   Anxiety: patient reports feeling anxious about her current symptoms.  - continue home lorazepam 1 mg daily   FEN/GI: patient currently fluid overloaded and with low BG. Low albumin and total protein - reflective of likely protein-calorie malnutrition.  - carb modified diet  - holding IV fluids for now  - continue home vitamin D supplementation  - pantoprazole 40 mg daily for GERD   PPx:  - subcutaneous heparin 5000 units TID   Dispo: Patient has previously been set up with [facility] for home health nursing. Patient really needs assistance with meds at home as she seems to have difficulty taking her medications appropriately, which is particularly dangerous given  her constellation of medications and chronic illnesses.   LOS: 1 day   This is a Medical Student Note.  The care of the patient was discussed with Dr. Niu and the assessment and plan formulated with their assistance.  Please see their attached note for official documentation of the daily encounter.  ,  06/19/2013, 1:14 PM    

## 2013-06-19 NOTE — Progress Notes (Signed)
Pt's B/P=189/98;MD on call qureshi made aware;no orders received

## 2013-06-20 ENCOUNTER — Telehealth: Payer: Self-pay | Admitting: *Deleted

## 2013-06-20 ENCOUNTER — Ambulatory Visit: Payer: Medicaid Other | Admitting: Internal Medicine

## 2013-06-20 DIAGNOSIS — N183 Chronic kidney disease, stage 3 unspecified: Secondary | ICD-10-CM

## 2013-06-20 DIAGNOSIS — D591 Autoimmune hemolytic anemia, unspecified: Secondary | ICD-10-CM

## 2013-06-20 DIAGNOSIS — I5033 Acute on chronic diastolic (congestive) heart failure: Secondary | ICD-10-CM

## 2013-06-20 DIAGNOSIS — B1920 Unspecified viral hepatitis C without hepatic coma: Secondary | ICD-10-CM | POA: Diagnosis present

## 2013-06-20 DIAGNOSIS — E46 Unspecified protein-calorie malnutrition: Secondary | ICD-10-CM

## 2013-06-20 DIAGNOSIS — B20 Human immunodeficiency virus [HIV] disease: Secondary | ICD-10-CM

## 2013-06-20 DIAGNOSIS — K7460 Unspecified cirrhosis of liver: Secondary | ICD-10-CM | POA: Diagnosis present

## 2013-06-20 DIAGNOSIS — N049 Nephrotic syndrome with unspecified morphologic changes: Secondary | ICD-10-CM | POA: Insufficient documentation

## 2013-06-20 DIAGNOSIS — Z21 Asymptomatic human immunodeficiency virus [HIV] infection status: Secondary | ICD-10-CM

## 2013-06-20 DIAGNOSIS — R911 Solitary pulmonary nodule: Secondary | ICD-10-CM

## 2013-06-20 DIAGNOSIS — I509 Heart failure, unspecified: Secondary | ICD-10-CM

## 2013-06-20 DIAGNOSIS — R635 Abnormal weight gain: Secondary | ICD-10-CM | POA: Diagnosis present

## 2013-06-20 DIAGNOSIS — J984 Other disorders of lung: Secondary | ICD-10-CM

## 2013-06-20 DIAGNOSIS — M109 Gout, unspecified: Secondary | ICD-10-CM

## 2013-06-20 DIAGNOSIS — Z8673 Personal history of transient ischemic attack (TIA), and cerebral infarction without residual deficits: Secondary | ICD-10-CM

## 2013-06-20 DIAGNOSIS — R600 Localized edema: Secondary | ICD-10-CM | POA: Diagnosis present

## 2013-06-20 LAB — COMPREHENSIVE METABOLIC PANEL
ALK PHOS: 175 U/L — AB (ref 39–117)
ALT: 128 U/L — AB (ref 0–35)
AST: 52 U/L — AB (ref 0–37)
Albumin: 2.2 g/dL — ABNORMAL LOW (ref 3.5–5.2)
BUN: 52 mg/dL — ABNORMAL HIGH (ref 6–23)
CO2: 18 mEq/L — ABNORMAL LOW (ref 19–32)
Calcium: 7.5 mg/dL — ABNORMAL LOW (ref 8.4–10.5)
Chloride: 105 mEq/L (ref 96–112)
Creatinine, Ser: 1.75 mg/dL — ABNORMAL HIGH (ref 0.50–1.10)
GFR calc non Af Amer: 32 mL/min — ABNORMAL LOW (ref >90)
GFR, EST AFRICAN AMERICAN: 37 mL/min — AB (ref >90)
Glucose, Bld: 200 mg/dL — ABNORMAL HIGH (ref 70–99)
POTASSIUM: 4.2 meq/L (ref 3.7–5.3)
SODIUM: 138 meq/L (ref 137–147)
TOTAL PROTEIN: 5.6 g/dL — AB (ref 6.0–8.3)
Total Bilirubin: 0.3 mg/dL (ref 0.3–1.2)

## 2013-06-20 LAB — URINALYSIS W MICROSCOPIC + REFLEX CULTURE
BILIRUBIN URINE: NEGATIVE
Glucose, UA: NEGATIVE mg/dL
KETONES UR: NEGATIVE mg/dL
Leukocytes, UA: NEGATIVE
NITRITE: NEGATIVE
Protein, ur: >300 mg/dL — AB
SPECIFIC GRAVITY, URINE: 1.017 (ref 1.005–1.030)
UROBILINOGEN UA: 0.2 mg/dL (ref 0.0–1.0)
pH: 6 (ref 5.0–8.0)

## 2013-06-20 LAB — CBC
HCT: 26.4 % — ABNORMAL LOW (ref 36.0–46.0)
Hemoglobin: 9.4 g/dL — ABNORMAL LOW (ref 12.0–15.0)
MCH: 35.1 pg — ABNORMAL HIGH (ref 26.0–34.0)
MCHC: 35.6 g/dL (ref 30.0–36.0)
MCV: 98.5 fL (ref 78.0–100.0)
Platelets: 120 10*3/uL — ABNORMAL LOW (ref 150–400)
RBC: 2.68 MIL/uL — ABNORMAL LOW (ref 3.87–5.11)
RDW: 17.3 % — AB (ref 11.5–15.5)
WBC: 4.4 10*3/uL (ref 4.0–10.5)

## 2013-06-20 LAB — GLUCOSE, CAPILLARY
GLUCOSE-CAPILLARY: 207 mg/dL — AB (ref 70–99)
GLUCOSE-CAPILLARY: 437 mg/dL — AB (ref 70–99)
Glucose-Capillary: 163 mg/dL — ABNORMAL HIGH (ref 70–99)
Glucose-Capillary: 192 mg/dL — ABNORMAL HIGH (ref 70–99)
Glucose-Capillary: 335 mg/dL — ABNORMAL HIGH (ref 70–99)

## 2013-06-20 LAB — OSMOLALITY: Osmolality: 303 mOsm/kg — ABNORMAL HIGH (ref 275–300)

## 2013-06-20 LAB — MICROALBUMIN / CREATININE URINE RATIO
Creatinine, Urine: 55.2 mg/dL
MICROALB/CREAT RATIO: 4465.4 mg/g — AB (ref 0.0–30.0)
Microalb, Ur: 246.49 mg/dL — ABNORMAL HIGH (ref 0.00–1.89)

## 2013-06-20 LAB — MAGNESIUM: Magnesium: 1.7 mg/dL (ref 1.5–2.5)

## 2013-06-20 MED ORDER — FUROSEMIDE 10 MG/ML IJ SOLN: 40.0000 mg | Freq: Three times a day (TID) | INTRAMUSCULAR | Status: DC

## 2013-06-20 MED ORDER — FUROSEMIDE 40 MG PO TABS: 40.0000 mg | ORAL_TABLET | Freq: Two times a day (BID) | ORAL | Status: DC

## 2013-06-20 MED ORDER — INSULIN ASPART 100 UNIT/ML ~~LOC~~ SOLN
10.0000 [IU] | Freq: Once | SUBCUTANEOUS | Status: AC
  Administered 2013-06-20: 10 [IU] via SUBCUTANEOUS

## 2013-06-20 MED ORDER — DARUNAVIR ETHANOLATE 800 MG PO TABS: 800.0000 mg | ORAL_TABLET | Freq: Every day | ORAL | Status: DC

## 2013-06-20 MED ORDER — FUROSEMIDE 40 MG PO TABS
40.0000 mg | ORAL_TABLET | Freq: Two times a day (BID) | ORAL | Status: DC
  Filled 2013-06-20 (×3): qty 1

## 2013-06-20 MED ORDER — RITONAVIR 100 MG PO TABS: 100.0000 mg | ORAL_TABLET | Freq: Every day | ORAL | Status: DC

## 2013-06-20 MED ORDER — ABACAVIR SULFATE 300 MG PO TABS: 600.0000 mg | ORAL_TABLET | Freq: Every day | ORAL | Status: DC

## 2013-06-20 MED ORDER — FUROSEMIDE 10 MG/ML IJ SOLN: 80.0000 mg | Freq: Two times a day (BID) | INTRAMUSCULAR | Status: DC

## 2013-06-20 MED ORDER — FUROSEMIDE 80 MG PO TABS
80.0000 mg | ORAL_TABLET | Freq: Two times a day (BID) | ORAL | Status: DC
Start: 2013-06-20 — End: 2013-06-20
  Administered 2013-06-20 (×2): 80 mg via ORAL
  Filled 2013-06-20 (×4): qty 1

## 2013-06-20 MED ORDER — PREDNISONE 20 MG PO TABS: 40.0000 mg | ORAL_TABLET | Freq: Every day | ORAL | Status: DC

## 2013-06-20 MED ORDER — LAMIVUDINE 150 MG PO TABS: 150.0000 mg | ORAL_TABLET | Freq: Every day | ORAL | Status: DC

## 2013-06-20 MED ORDER — OXYCODONE HCL 10 MG PO TABS: 10.0000 mg | ORAL_TABLET | Freq: Four times a day (QID) | ORAL | Status: DC | PRN

## 2013-06-20 NOTE — Evaluation (Signed)
Physical Therapy Evaluation Patient Details Name: Michelle [name] MRN: [id number] DOB: 07/10/1960 Today's Date: 06/20/2013 Time: 5643-3295 PT Time Calculation (min): 16 min  PT Assessment / Plan / Recommendation History of Present Illness  This is a 53 year old woman with PMH of CHF (EF normal, with Grade-2 diastolic dysfunction), HIV, seizure disorder, hx of stroke, HTN, T2DM, CKD, gout, who was admitted with LE edema and weight gain.  Clinical Impression  Pt admitted with CHF exacerbation. Pt currently with functional limitations due to the deficits listed below (see PT Problem List). Pt with poor awareness of fall risk (despite 2 falls in past 6 months). Pt will benefit from skilled HHPT to increase their independence and safety with mobility.     PT Assessment  All further PT needs can be met in the next venue of care    Follow Up Recommendations  Home health PT    Does the patient have the potential to tolerate intense rehabilitation      Barriers to Discharge        Equipment Recommendations  Other (comment) (tub bench)    Recommendations for Other Services     Frequency      Precautions / Restrictions Precautions Precautions: Fall Precaution Comments: fell twice in past 6 months; unsure how but thinks she just got "over-balanced"   Pertinent Vitals/Pain Denied pain       Mobility  Bed Mobility Overal bed mobility: Modified Independent Transfers Overall transfer level: Modified independent Equipment used: None General transfer comment: wide base of support and incr time Ambulation/Gait Ambulation/Gait assistance: Supervision Ambulation Distance (Feet): 75 Feet Assistive device: None Gait Pattern/deviations: Step-through pattern;Wide base of support Gait velocity interpretation: <1.8 ft/sec, indicative of risk for recurrent falls General Gait Details: states she only uses RW when gout flares up; encouraged use at all times    Exercises     PT Diagnosis:  Difficulty walking  PT Problem List: Decreased balance;Decreased knowledge of use of DME;Decreased safety awareness;Impaired sensation;Obesity PT Treatment Interventions:       PT Goals(Current goals can be found in the care plan section) Acute Rehab PT Goals Patient Stated Goal: go home tomorrow PT Goal Formulation: No goals set, d/c therapy  Visit Information  Last PT Received On: 06/20/13 Assistance Needed: +1 History of Present Illness: This is a 53 year old woman with PMH of CHF (EF normal, with Grade-2 diastolic dysfunction), HIV, seizure disorder, hx of stroke, HTN, T2DM, CKD, gout, who was admitted with LE edema and weight gain.       Prior [place] expects to be discharged to:: Private residence Living Arrangements: Alone Available Help at Discharge: Family;Available PRN/intermittently Type of Home: Apartment Home Access: Level entry Home Layout: One level Home Equipment: Cane - single point;Grab bars - tub/shower;Grab bars - toilet;Walker - standard;Walker - 2 wheels Additional Comments: pt reports she has tub shower with curtain and is requesting a tub bench  Prior Function Level of Independence: Needs assistance Gait / Transfers Assistance Needed: uses device at times (especially when gout acting up) uses motorized scooters at store (when available) ADL's / Homemaking Assistance Needed: (A) to take a bath; gets dressed independently and can fix small meals independently  Communication Communication: No difficulties Dominant Hand: Right    Cognition  Cognition Arousal/Alertness: Awake/alert Behavior During Therapy: WFL for tasks assessed/performed Overall Cognitive Status: Within Functional Limits for tasks assessed    Extremity/Trunk Assessment Upper Extremity Assessment Upper Extremity Assessment: Overall WFL for tasks  assessed Lower Extremity Assessment Lower Extremity Assessment: RLE deficits/detail;LLE deficits/detail RLE  Deficits / Details: edema and numbness in feet LLE Deficits / Details: edema and numbness in feet Cervical / Trunk Assessment Cervical / Trunk Assessment: Normal   Balance Balance Overall balance assessment: Needs assistance Standing balance support: No upper extremity supported Standing balance-Leahy Scale: Good Tandem Stance - Right Leg: 0 (steps just past Lt foot; unsteady; supervision x 15sec) Rhomberg - Eyes Opened: 0 (unable to attain position; unsteady) High level balance activites: Direction changes;Turns;Head turns;Other (comment) High Level Balance Comments: standing with wide stance, able to close eyes and maintain balance x 30 seconds; no overt loss of balance throughout testing, but is clearly unsteady with wide BOS and arms without swing and in stiff, guarded position Standardized Balance Assessment Standardized Balance Assessment : Berg Balance Test Berg Balance Test Sit to Stand: Able to stand  independently using hands Standing Unsupported: Able to stand safely 2 minutes Sitting with Back Unsupported but Feet Supported on Floor or Stool: Able to sit safely and securely 2 minutes Stand to Sit: Controls descent by using hands Transfers: Able to transfer safely, definite need of hands Standing Unsupported with Eyes Closed: Able to stand 10 seconds with supervision Standing Ubsupported with Feet Together: Needs help to attain position and unable to hold for 15 seconds From Standing, Reach Forward with Outstretched Arm: Can reach forward >5 cm safely (2") From Standing Position, Pick up Object from Floor: Able to pick up shoe, needs supervision Standing Unsupported, One Foot in Front: Loses balance while stepping or standing  End of Session PT - End of Session Equipment Utilized During Treatment: Gait belt Activity Tolerance: Patient tolerated treatment well Patient left: in chair;with call bell/phone within reach Nurse Communication: Mobility status (spoke with Case Mgr re:  pt request tub bench)  GP     Michelle [name] 06/20/2013, 11:35 AM Pager [phone]

## 2013-06-20 NOTE — Progress Notes (Signed)
MD notified of CBG 437. MD placed orders to give novolog then d/c pt home as planned to follow up with home medication routine.

## 2013-06-20 NOTE — Progress Notes (Signed)
Visit to patient while in hospital. Will call after patient is discharged to assist with transition of care. 

## 2013-06-20 NOTE — Telephone Encounter (Signed)
Requested pt call office for new appt. 

## 2013-06-20 NOTE — Progress Notes (Signed)
  Date: 06/20/2013  Patient name: Michelle [name]  Medical record number: [id number]  Date of birth: 01-Aug-1960   This patient has been seen and the plan of care was discussed with the house staff. Please see their note for complete details. I concur with their findings with the following additions/corrections: This morning, the patient is walking around the room and is packing her things. She states she feels amazing. She denies SOB. She denies hemoptysis. She denies CP. She states her LE swelling has improved substantially.  On my exam, she has 3+ pitting edema bilat LE.  At this time, her volume overload is likely due to HFpEF. I agree with changing her to lasix 80 mg po bid. We don't need to repeat another TTE. I suspect recent prednisone use triggered this. I would check a UPr/Cr ratio and look for evidence of nephrotic syndrome. She is noted to have evidence of a cavitary pulmonary nodule on CT. Will have pulmonary evaluate her. She does not have active TB, but latent TB is possible.  Her LFTs will need to be monitored as outpatient, these are decreasing today. I suspect medication effect. She will need outpatient follow up for AIHA. There is no current evidence of hemolysis. Continue prednisone taper as outpatient, will need hematology follow up. Currently medically stable for D/C. We will have HH setup for her.  Jonah Blue, DO, FACP Faculty [facility] Internal Medicine Residency Program 06/20/2013, 3:51 PM

## 2013-06-20 NOTE — Care Management Note (Signed)
    Page 1 of 2   06/20/2013     4:56:25 PM   CARE MANAGEMENT NOTE 06/20/2013  Patient:  Michelle [name], Michelle [name]   Account Number:  [id number]  Date Initiated:  06/20/2013  Documentation initiated by:  [name]  Subjective/Objective Assessment:   dx chf ex  admit- lives alone.  Active with [facility] for [facility]     Action/Plan:   pt eval-rec hhpt/ot but has medicaid so can not get unles dx is cva or fx.   Anticipated DC Date:  06/20/2013   Anticipated DC Plan:  HOME W HOME HEALTH SERVICES      DC Planning Services  CM consult      [facility] Choice  Resumption Of Svcs/PTA Provider   Choice offered to / List presented to:  C-1 Patient   DME arranged  TUB BENCH      DME agency  [facility]     [facility] arranged  HH-1 RN      [facility] agency  [facility]   Status of service:  Completed, signed off Medicare Important Message given?   (If response is "NO", the following Medicare IM given date fields will be blank) Date Medicare IM given:   Date Additional Medicare IM given:    Discharge Disposition:  HOME W HOME HEALTH SERVICES  Per UR Regulation:  Reviewed for med. necessity/level of care/duration of stay  If discussed at Long Length of Stay Meetings, dates discussed:    Comments:  06/20/13 16:54 [name] RN,BSN [phone] patient lives alone, she is active with [facility] for [facility], will resume, patient has medicaid and can not receive hhpt/ot unless had cva or fx on this admisison.  Tub bench ordered for patient as well, Justin notified.

## 2013-06-20 NOTE — Discharge Summary (Signed)
Name: Michelle [name] MRN: [id number] DOB: August 11, 1960 53 y.o. PCP: Clinton Gallant, MD  Date of Admission: 06/18/2013 11:17 AM Date of Discharge: 06/20/2013 Attending Physician: Dominic Pea, DO  Discharge Diagnosis: Principal Problem:   Acute on chronic diastolic heart failure Active Problems:   Seizure   Unspecified essential hypertension   HIV disease   Gout   CKD (chronic kidney disease) stage 3, GFR 30-59 ml/min   CVA (cerebral vascular accident)   Diabetes   Autoimmune hemolytic anemia   Pulmonary hypertension   Diastolic dysfunction-grade 2   Unspecified protein-calorie malnutrition   Seizures   Stroke   Diabetes mellitus without complication   HIV (human immunodeficiency virus infection)   Hypertension   Shortness of breath   HCV (hepatitis C virus)   Leg edema   Weight gain   Nodule of left lung   Cavitary lesion of lung   Cirrhosis  Discharge Medications:   Medication List    STOP taking these medications       abacavir-lamiVUDine 600-300 MG per tablet  Commonly known as:  EPZICOM     HYDROcodone-acetaminophen 10-325 MG per tablet  Commonly known as:  NORCO     lopinavir-ritonavir 200-50 MG per tablet  Commonly known as:  KALETRA      TAKE these medications       abacavir 300 MG tablet  Commonly known as:  ZIAGEN  Take 2 tablets (600 mg total) by mouth daily.     ACCU-CHEK AVIVA PLUS W/DEVICE Kit  USE AS DIRECTED     acyclovir 800 MG tablet  Commonly known as:  ZOVIRAX  Take 1 tablet (800 mg total) by mouth daily.     aspirin EC 81 MG tablet  Take 1 tablet (81 mg total) by mouth daily.     cholecalciferol 1000 UNITS tablet  Commonly known as:  VITAMIN D  Take 1,000 Units by mouth daily.     cloNIDine 0.2 MG tablet  Commonly known as:  CATAPRES  Take 1 tablet (0.2 mg total) by mouth 2 (two) times daily.     Darunavir Ethanolate 800 MG tablet  Commonly known as:  PREZISTA  Take 1 tablet (800 mg total) by mouth daily with breakfast.       furosemide 40 MG tablet  Commonly known as:  LASIX  Take 1 tablet (40 mg total) by mouth 2 (two) times daily.     glucose blood test strip  Commonly known as:  ACCU-CHEK AVIVA  Use as instructed     insulin aspart 100 UNIT/ML injection  Commonly known as:  novoLOG  Inject into the skin 3 (three) times daily before meals. Home Sliding scale     insulin glargine 100 UNIT/ML injection  Commonly known as:  LANTUS  Inject 30 Units into the skin at bedtime.     labetalol 100 MG tablet  Commonly known as:  NORMODYNE  Take 1 tablet (100 mg total) by mouth 2 (two) times daily.     lamiVUDine 150 MG tablet  Commonly known as:  EPIVIR  Take 1 tablet (150 mg total) by mouth daily.     levETIRAcetam 500 MG tablet  Commonly known as:  KEPPRA  Take 3 tablets (1,500 mg total) by mouth every 12 (twelve) hours.     lisinopril 40 MG tablet  Commonly known as:  PRINIVIL,ZESTRIL  Take 1 tablet (40 mg total) by mouth daily.     LORazepam 1 MG tablet  Commonly known as:  ATIVAN  Take 1 mg by mouth daily. Take one tablet for seizure prevention.     methocarbamol 500 MG tablet  Commonly known as:  ROBAXIN  Take 1,000 mg by mouth every 6 (six) hours as needed for muscle spasms.     Oxycodone HCl 10 MG Tabs  Take 1 tablet (10 mg total) by mouth every 6 (six) hours as needed for severe pain.     pantoprazole 40 MG tablet  Commonly known as:  PROTONIX  Take 1 tablet (40 mg total) by mouth daily.     phenytoin 50 MG tablet  Commonly known as:  DILANTIN  Chew 2 tablets (100 mg total) by mouth 3 (three) times daily.     predniSONE 20 MG tablet  Commonly known as:  DELTASONE  Take 2 tablets (40 mg total) by mouth daily with breakfast.     ritonavir 100 MG Tabs tablet  Commonly known as:  NORVIR  Take 1 tablet (100 mg total) by mouth daily with breakfast.     triamcinolone cream 0.1 %  Commonly known as:  KENALOG  Apply 1 application topically 2 (two) times daily as needed. For rash.         Disposition and follow-up:   Michelle [name] was discharged from [facility] in Stable condition.  At the hospital follow up visit please address:  1.  BP control? CBG control? LE edema? - On ~1/28, please check CBC and if Hgb stable, decrease prednisone to 20 mg daily for 1 month, then re-evaluate with CBC, haptoglobin, LDH in the clinic before discontinuing for good.  - For her LLL nodule she will need limited CT in 6 weeks (around March 5), then every 3 months for next year. - She needs a renal referral for her nephrotic syndrome.  2.  Labs / imaging needed at time of follow-up: Quanitferon, CMP, CBC  3.  Pending labs/ test needing follow-up: None  Follow-up Appointments: Follow-up Information   Follow up with Jim Like, [place], DO On 07/02/2013. (at 11:30 AM. This is your neurologist.)    Specialty:  Neurology   Contact information:   [address] [place] [address] [phone]       Follow up with Carlyle Basques, MD On 07/09/2013. (At 9:30 AM. This is your infectious disease doctor.)    Specialty:  Infectious Diseases   Contact information:   [place] [address] [phone]       Follow up with Clinton Gallant, MD On 06/26/2013. (10:30 AM. This is your primary care physician.)    Specialty:  Internal Medicine   Contact information:   [place] [address] [phone]       Follow up with Concha Norway, MD On 06/27/2013. (10:30 AM. This is your hemotology doctor.)    Specialty:  Internal Medicine   Contact information:   [place] [address] [phone]       Discharge Instructions: Discharge Orders   Future Appointments Provider Department Dept Phone   06/21/2013 11:00 AM [facility] [phone]   06/26/2013 10:30 AM Clinton Gallant, MD [facility] [phone]   06/27/2013 10:30 AM  [facility] [phone]   06/27/2013 10:45 AM [facility] [phone]   06/27/2013 11:00 AM [facility]  [phone]   07/02/2013 11:30 AM [facility] [phone]   07/09/2013 9:30 AM Carlyle Basques, MD [facility] [phone]   Future Orders Complete By Expires   Diet - low sodium heart healthy  As directed    Increase activity slowly  As directed       Consultations:  Pulmonary  Procedures Performed:  X-Shipes Chest Pa And Lateral   06/18/2013   ADDENDUM REPORT: 06/18/2013 22:32  ADDENDUM: There is a questionable 1 cm nodular density along the anterior lower chest on the lateral view. This could be contributing to the nodular density on the frontal view but may not account for the entire density. A chest CT would be most definitive to exclude a pulmonary nodule.   Electronically Signed   By: Markus Daft M.D.   On: 06/18/2013 22:32   06/18/2013   CLINICAL DATA:  Chest pain and shortness of breath.  EXAM: CHEST  2 VIEW  COMPARISON:  05/10/2013  FINDINGS: Two views of the chest were obtained. This is a 2.1 cm nodular structure in the retrocardiac space on the frontal view. However, a nodule is not clearly identified on the lateral view. This could represent overlying shadows. Heart size is normal. Lungs are clear without airspace disease or edema. There is probably mild basilar atelectasis. No evidence for pleural effusions.  IMPRESSION: No acute chest findings.  Indeterminate 2.1 cm nodular structure in the retrocardiac region on the frontal view. However, there is not a correlating lesion on the lateral view and this may represent overlying structures. Recommend a follow-up two view chest radiograph to confirm that this represents overlying structures  rather than a true nodule.  Electronically Signed: By: Markus Daft M.D. On: 06/18/2013 22:07   Ct Chest Wo Contrast  06/19/2013   CLINICAL DATA:  Pulmonary nodule.  EXAM: CT CHEST WITHOUT CONTRAST  TECHNIQUE: Multidetector CT imaging of the chest was performed following the standard protocol without IV contrast.  COMPARISON:  Chest x-Bottoms dated 06/18/2013  FINDINGS: There is a 25 x 17 x 18 mm poorly defined cavitary lesion in the left lower lobe with peripheral linear atelectasis extending from the area of the lesion. There is no hilar or mediastinal adenopathy. There is slight linear atelectasis at the right lung base. Heart size is normal. There appears to be a coronary artery stent in place.  No osseous abnormality.  The visualized portion of the upper abdomen is normal.  IMPRESSION: 2.5 cm cavitary poorly defined nodular density in the left lower lobe. I suspect this represents atypical infection in this patient with HIV disease.  Malignancy is felt to be less likely.   Electronically Signed   By: Rozetta Nunnery M.D.   On: 06/19/2013 16:34   US Abdomen Complete  06/19/2013   CLINICAL DATA:  Abdominal pain, elevated LFTs  EXAM: ULTRASOUND ABDOMEN COMPLETE  COMPARISON:  05/28/2003  FINDINGS: Gallbladder:  No gallstones, gallbladder wall thickening, or pericholecystic fluid. Negative sonographic Murphy's sign.  Common bile duct:  Diameter: 4 mm.  Liver:  Mildly nodular hepatic contour (for example, image 19). No focal hepatic lesion is seen.  IVC:  No abnormality visualized.  Pancreas:  Visualized portions are unremarkable.  Spleen:  Measures 6.2 cm.  Right Kidney:  Length: 12.3 cm.  No mass or hydronephrosis.  Left Kidney:  Length: 11.6 cm.  No mass or hydronephrosis.  Abdominal aorta:  No aneurysm visualized.  Other findings:  None.  IMPRESSION:  Mildly nodular hepatic contour, raising the possibility of cirrhosis.  Otherwise, negative abdominal ultrasound.   Electronically Signed   By: Julian Hy M.D.    On: 06/19/2013 16:08    2D Echo:   ------------------------------------------------------------ Transthoracic Echocardiography  Patient: Latashia, Koch MR #: [id number] Study Date: 05/31/2013 Gender: F Age: 71 Height: 170.2cm Weight: 68.2kg BSA: 1.41m2 Pt. Status: Room: 2[place] Caller[place]:  ------------------------------------------------------------  ------------------------------------------------------------ Indications: Hypertension - benign 401.1.  ------------------------------------------------------------ History: PMH: HIV Stroke. Risk factors: Diabetes mellitus.  ------------------------------------------------------------ Study Conclusions  - Left ventricle: The cavity size was normal. There was mild concentric hypertrophy. Systolic function was normal. Wall motion was normal; there were no regional wall motion abnormalities. Features are consistent with a pseudonormal left ventricular filling pattern, with concomitant abnormal relaxation and increased filling pressure (grade 2 diastolic dysfunction). Doppler parameters are consistent with elevated ventricular end-diastolic filling pressure. - Aortic valve: There is focal thickening of the tip of the left coronary leaflet of the aortic valve. Valve area: 2.05cm^2(VTI). Valve area: 2.03cm^2 (Vmax). - Mitral valve: Mild regurgitation. - Left atrium: The atrium was moderately dilated. - Right ventricle: Systolic pressure was increased. - Tricuspid valve: Mild regurgitation. - Pulmonary arteries: PA peak pressure: 692mHg (S). Transthoracic echocardiography. M-mode, complete 2D, spectral Doppler, and color Doppler. Height: Height: 170.2cm. Height: 67in. Weight: Weight: 68.2kg. Weight: 150lb. Body mass index: BMI: 23.5kg/m^2. Body surface area: BSA:  1.65m56m Blood pressure: 124/74. Patient status: Inpatient. Location: Bedside.  ------------------------------------------------------------   Admission HPI:  This is a 52 42ar old woman with PMH of CHF (EF normal, with Grade-2 diastolic dysfunction), HIV (CD4 490 and VL<20 on 05/07/13), chronic HCV, seizure disorder, hx of stroke, HTN, T2DM, CKD, gout, and AIHA, who presents to clinic complaining of LE edema and weight gain.   The patient was recently hospitalized because of hypertensive encephalopathy secondary to noncompliance to the medications. Patient was also diagnosed with autoimmune hemolytic anemia and is currently on tapering dose of prednisone. The patient reports that she has been compliant to her medications, including Lasix 40 mg twice a day. She reports that her leg edema has been progressively getting worse in the past 5 day and her BW also increased. She was found to have 24 pound increase in her body weight in clinic. This is associated with dyspnea on exertion as well as mild sternal chest pain that improves with deep breathing. She denies nausea, diaphoresis, orthopnea and PND and does not have dyspnea at rest. She endorses intermittent headache and blurry vision which is improved by taking Norco. Of note, in clinic she was symptomatically hypoglycemic following taking a double dose of her insulin because she thought she was "behind" on her dosing.   ROS: has watery, nonbloody diarrhea today associated with mild, intermittent abdominal cramping. Has HA, SOB, leg edema, and mild intermittent chest pain. Patient has yellow skin.   Physical Exam:  General: Not in acute distress  HEENT: PERRL, EOMI, scleral icterus, No JVD or bruit  Cardiac: S1/S2, RRR, No murmurs, gallops or rubs  Pulm: Good air movement bilaterally. Clear to auscultation bilaterally. No rales, wheezing, rhonchi or rubs.  Abd: Soft, nondistended, nontender, no rebound pain, no organomegaly, BS present  Ext: 3+  pitting leg edema bilaterally. 2+DP/PT pulse bilaterally  Musculoskeletal: No joint deformities, erythema, or stiffness, ROM full  Skin: No rashes. Has jaundice  Neuro: Alert and oriented X3, cranial  nerves II-XII grossly intact, muscle strength 5/5 in all extremeties, sensation to light touch intact. Brachial reflex 2+ bilaterally.  Psych: Patient is not psychotic, no suicidal or hemocidal ideation.   Hospital Course by problem list:  1. Acute on chronic diastolic heart failure - Patient's symptoms of worsening leg edema, weight gain, shortness of breath were most likely caused by diastolic heart failure exacerbation. Echo 05/31/13 showed normal systolic function but grade 2 diastolic dysfunction. Patient reports that she has been compliant to her home medications, including Lasix 37m po daily. Recent prednisone use may have contributed to her fluid retention. She diuresed well on IV Lasix with negative I/O of 2.2L. Weight trended down as below. Her Cr stabilized at baseline, see below. Trop negative x 1. We continued her home aspirin 81 mg daily, beta blocker, lisinopril. We discharged the patient on an increased Lasix dosage of 40 mg po BID with home health PT and RN services. I also filled out a form for her to get a home health aide through [facility] for help with her medications.  Filed Weights   06/19/13 0555 06/20/13 0500  Weight: 204 lb 5.9 oz (92.7 kg) 198 lb 3.1 oz (89.9 kg)    2. Acute on CKD stage 3 with nephrotic range proteinuria - Cr on admission 1.95. Unclear baseline. Lowest value from 06/02/13 was 0.9, but this may have been falsely low given that otherwise over past two months Cr has ranged from 1.55-2.09. Creatinine stabilized at ~1.7 prior to discharge. Given leg swelling we also evalauted for nephrotic syndrome given history of diabetes, hepatitis C, uncontrolled hypertension. Urinalysis showed >300 protein and Urine micro albumin/Cr ratio was 4465 mg/g. She also has  hypoalbuminemia (2.2) and hypertriglyceridemia (246). She is already on an ACE-inhibitor as well as prednisone. She will need renal referral as an outpatient. Please also consider starting a nondihydropyridine calcium channel blocker such as Diltiazem or verpamil. These drugs may have a synergistic vasodilatory effect with ACE inhibitors on the efferent artery, and may have cardioprotective effects; however, the major drawback would be possible aggravation of peripheral edema.  Creatinine, Ser  Date Value Range Status  06/20/2013 1.75* 0.50 - 1.10 mg/dL Final  06/19/2013 1.73* 0.50 - 1.10 mg/dL Final  06/19/2013 1.78* 0.50 - 1.10 mg/dL Final  06/18/2013 1.95* 0.50 - 1.10 mg/dL Final    3. Pulmonary Nodule - Noted on admission CXR: 1 cm nodular density along the anterior lower chest, has been present on prior films as far back as 2004. Chest CT 06/19/13 showed 2.5 cm cavitary poorly defined nodular density in the left lower lobe. 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 Pulmonary was consulted. They felt that though the appearance is worrisome for malignancy vs. Infection, the multiple historical report point towards this being residual scarring/benign nodule from the same lesion. Patient denies a history of TB, cough, purulent or bloody sputum, but has a significant ID history, so please draw a Quantiferon at her follow up appointment next week. She will need limited, non contrast CT in 6 weeks, followed by limited non-contrasted CT chest q3 months for one year. If at any time, the LLL nodule progresses in size, referral to pulmonary medicine as outpt would be indicated for FOB and transbronchial biopsy.  4. Low CO2 - Chronic issue, near baseline of 19-21 upon discharge. Likely 2/2 acute on CKD. She may eventually need alkali therapy to maintain the serum bicarbonate concentration in the normal range (sodium bicarbonate  0.5 to 1 meq/kg per day) but we will defer this  to the outpatient setting.  CO2  Date Value Range Status  06/20/2013 18* 19 - 32 mEq/L Final  06/19/2013 14* 19 - 32 mEq/L Final  06/18/2013 15* 19 - 32 mEq/L Final  06/18/2013 14* 19 - 32 mEq/L Final  06/11/2013 15* 19 - 32 mEq/L Final  06/02/2013 24  19 - 32 mEq/L Final  06/01/2013 17* 19 - 32 mEq/L Final  05/31/2013 17* 19 - 32 mEq/L Final  05/29/2013 19  19 - 32 mEq/L Final  05/28/2013 23  19 - 32 mEq/L Final    5. Elevated LFTs - She has a history of Hepatitis C genotype 1b that is poorly controlled. Very high HCV quant on 05/21/13. Patient is also on many medications that can cause liver damage. She had a RUQ ultrasound, showing mildly nodular hepatic contour, raising the possibility of cirrhosis. We changed her pain medication to oxycodone without acetaminophen for pain control. Her LFTs were down trending at day of discharge. Follow up CMP as outpatient.  Hepatic Function Panel     Component Value Date/Time   PROT 5.6* 06/20/2013 0515   ALBUMIN 2.2* 06/20/2013 0515   AST 52* 06/20/2013 0515   ALT 128* 06/20/2013 0515   ALKPHOS 175* 06/20/2013 0515   BILITOT 0.3 06/20/2013 0515   BILIDIR 1.0* 05/28/2013 1930   IBILI 0.3 05/28/2013 1930    6. HTN - History of poor control and several admissions for hypertensive emergency. Decent BP control was achieved for her upon discharge on home labetalol 100 mg BID, clonidine 0.2 mg BID, lisinopril 40 mg daily. As mentioned above, consider starting a nondihydropyridine calcium channel blocker such as Diltiazem or verpamil for treatment of BP control and nephrotic syndrome.  7. T2DM/hypoglycemia - A1C 5.0 on 05/14/13. CBG trend below. We provided lantus 10 U daily and a SSI-sensitive. Sugars were less well controlled after she began eating well, so we resumed her home dose of Lantus 30U daily at discharge and sent her a prescription for a glucometer with supplies.   Glucose-Capillary  Date Value Range Status  06/20/2013 335* 70 - 99 mg/dL Final    06/20/2013 163* 70 - 99 mg/dL Final  06/20/2013 207* 70 - 99 mg/dL Final  06/20/2013 192* 70 - 99 mg/dL Final  06/19/2013 228* 70 - 99 mg/dL Final  06/19/2013 207* 70 - 99 mg/dL Final  06/19/2013 377* 70 - 99 mg/dL Final  06/18/2013 146* 70 - 99 mg/dL Final  06/18/2013 92  70 - 99 mg/dL Final  06/18/2013 72  70 - 99 mg/dL Final    8. HIV - Most recent CD4 was 490 and viral load <20 on 05/07/13. New ART regimen per Dr. Baxter Flattery includes: abacavir 627m daily, acyclovir 800 mg daily, Prezista 8012mdaily, lamivudine 15069maily, ritonavir 100m36mily. She was discharged on this regimen. Close ID follow up was arranged.  9. 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 Hgb stable at baseline 8-9. We continued her prednisone taper at 40 mg daily today with plans to decrease to 20 mg daily on 1/28 for 1 month, then re-evaluate with CBC, haptoglobin, LDH in the clinic before discontinuing for good. We also arranged for heme/onc follow up with Dr. ChisJuliann Muledischarge.  Hemoglobin  Date Value Range Status  06/20/2013 9.4* 12.0 - 15.0 g/dL Final  06/18/2013 8.9* 12.0 - 15.0 g/dL Final  06/11/2013 9.0* 12.0 - 15.0 g/dL  Final  06/02/2013 8.3* 12.0 - 15.0 g/dL Final  06/01/2013 8.5* 12.0 - 15.0 g/dL Final    10. Seizures - Phenytoin level low (3.8) but with low albumin, corrects to 7.3 per clinical pharmacist. No true therapeutic target range for Keppra per clinical pharmacist. Will continue home dosing but will consolidate phenytoin to encourage adherence. Patient was discharged on levetiracetam 1500 mg po Q12, phenytoin 300 mg po QHS   11. Diarrhea: Patient c/o diarrhea on admission but no episodes yesterday or today, so we did not check for C. Diff infection.  12. Chronic pain - Stable. We continued home methocarbamol 100 mg BID and changed her home Norco to oxycodone 10 mg po PRN severe pain given LFT abnormalities.  13. Anxiety: Stable. We continued home  lorazepam 1 mg daily.   Discharge Vitals:   BP 127/69  Pulse 78  Temp(Src) 98.4 F (36.9 C) (Oral)  Resp 20  Ht 5' 5"  (1.651 m)  Wt 198 lb 3.1 oz (89.9 kg)  BMI 32.98 kg/m2  SpO2 96%  Discharge Labs:  Results for orders placed during the hospital encounter of 06/18/13 (from the past 24 hour(s))  GLUCOSE, CAPILLARY     Status: Abnormal   Collection Time    06/19/13  4:48 PM      Result Value Range   Glucose-Capillary 207 (*) 70 - 99 mg/dL   Comment 1 Documented in Chart     Comment 2 Notify RN    BUN     Status: Abnormal   Collection Time    06/19/13  6:41 PM      Result Value Range   BUN 51 (*) 6 - 23 mg/dL  CREATININE, SERUM     Status: Abnormal   Collection Time    06/19/13  6:41 PM      Result Value Range   Creatinine, Ser 1.73 (*) 0.50 - 1.10 mg/dL   GFR calc non Af Amer 32 (*) >90 mL/min   GFR calc Af Amer 37 (*) >90 mL/min  GLUCOSE, CAPILLARY     Status: Abnormal   Collection Time    06/19/13  9:12 PM      Result Value Range   Glucose-Capillary 228 (*) 70 - 99 mg/dL  GLUCOSE, CAPILLARY     Status: Abnormal   Collection Time    06/20/13 12:23 AM      Result Value Range   Glucose-Capillary 192 (*) 70 - 99 mg/dL   Comment 1 Documented in Chart     Comment 2 Notify RN    GLUCOSE, CAPILLARY     Status: Abnormal   Collection Time    06/20/13  4:39 AM      Result Value Range   Glucose-Capillary 207 (*) 70 - 99 mg/dL   Comment 1 Documented in Chart     Comment 2 Notify RN    OSMOLALITY     Status: Abnormal   Collection Time    06/20/13  5:15 AM      Result Value Range   Osmolality 303 (*) 275 - 300 mOsm/kg  COMPREHENSIVE METABOLIC PANEL     Status: Abnormal   Collection Time    06/20/13  5:15 AM      Result Value Range   Sodium 138  137 - 147 mEq/L   Potassium 4.2  3.7 - 5.3 mEq/L   Chloride 105  96 - 112 mEq/L   CO2 18 (*) 19 - 32 mEq/L   Glucose, Bld 200 (*) 70 -  99 mg/dL   BUN 52 (*) 6 - 23 mg/dL   Creatinine, Ser 1.75 (*) 0.50 - 1.10 mg/dL    Calcium 7.5 (*) 8.4 - 10.5 mg/dL   Total Protein 5.6 (*) 6.0 - 8.3 g/dL   Albumin 2.2 (*) 3.5 - 5.2 g/dL   AST 52 (*) 0 - 37 U/L   ALT 128 (*) 0 - 35 U/L   Alkaline Phosphatase 175 (*) 39 - 117 U/L   Total Bilirubin 0.3  0.3 - 1.2 mg/dL   GFR calc non Af Amer 32 (*) >90 mL/min   GFR calc Af Amer 37 (*) >90 mL/min  MAGNESIUM     Status: None   Collection Time    06/20/13  5:15 AM      Result Value Range   Magnesium 1.7  1.5 - 2.5 mg/dL  GLUCOSE, CAPILLARY     Status: Abnormal   Collection Time    06/20/13  8:01 AM      Result Value Range   Glucose-Capillary 163 (*) 70 - 99 mg/dL  GLUCOSE, CAPILLARY     Status: Abnormal   Collection Time    06/20/13 11:45 AM      Result Value Range   Glucose-Capillary 335 (*) 70 - 99 mg/dL  CBC     Status: Abnormal   Collection Time    06/20/13 11:52 AM      Result Value Range   WBC 4.4  4.0 - 10.5 K/uL   RBC 2.68 (*) 3.87 - 5.11 MIL/uL   Hemoglobin 9.4 (*) 12.0 - 15.0 g/dL   HCT 26.4 (*) 36.0 - 46.0 %   MCV 98.5  78.0 - 100.0 fL   MCH 35.1 (*) 26.0 - 34.0 pg   MCHC 35.6  30.0 - 36.0 g/dL   RDW 17.3 (*) 11.5 - 15.5 %   Platelets 120 (*) 150 - 400 K/uL  URINALYSIS W MICROSCOPIC + REFLEX CULTURE     Status: Abnormal   Collection Time    06/20/13 12:24 PM      Result Value Range   Color, Urine YELLOW  YELLOW   APPearance CLEAR  CLEAR   Specific Gravity, Urine 1.017  1.005 - 1.030   pH 6.0  5.0 - 8.0   Glucose, UA NEGATIVE  NEGATIVE mg/dL   Hgb urine dipstick TRACE (*) NEGATIVE   Bilirubin Urine NEGATIVE  NEGATIVE   Ketones, ur NEGATIVE  NEGATIVE mg/dL   Protein, ur >300 (*) NEGATIVE mg/dL   Urobilinogen, UA 0.2  0.0 - 1.0 mg/dL   Nitrite NEGATIVE  NEGATIVE   Leukocytes, UA NEGATIVE  NEGATIVE   WBC, UA 0-2  <3 WBC/hpf   RBC / HPF 0-2  <3 RBC/hpf   Bacteria, UA RARE  RARE   Squamous Epithelial / LPF FEW (*) RARE   Casts HYALINE CASTS (*) NEGATIVE   Urine-Other MANY YEAST      Signed: Lesly Dukes, MD 06/20/2013, 4:35 PM   Time  Spent on Discharge: 35 minutes Services Ordered on Discharge: Home health PT, RN, aide Equipment Ordered on Discharge: Tub bench

## 2013-06-20 NOTE — Discharge Instructions (Signed)
It was a pleasure taking care of you. - Please follow up with the [facility] as scheduled above. They will evaluate your kidney function and your leg swelling, and taper your steroids for your anemia. - Please follow up with Dr. Rosie Fate, a new blood doctor for your anemia, as schedule above.  - Please follow up with Dr. Drue Second, your HIV doctor, as scheduled above. - Please follow up with Dr. Hosie Poisson, your brain doctor, as scheduled above. - We have changed your Lasix dosing to 40mg  (1 pill) TWICE a day. You will probably need to take this higher dose while you continue to take steroids for your anemia. - We have also made some changed to you HIV meds. Please stop taking EPZICOM and Kaletra. Start  abacavir 2 tablets (600mg ) daily, Darunavir Ethanolate 1 tablet (800 MG) daily, lamiVUDine 1 tablet (150 mg) daily, ritonavir 1 tab (100 MG daily)- For your lung nodule, we will repeat imaging in several months. Please call the clinic if you develop shortness of breath, cough, or blood in your cough. - We have arranged for home health PT and RN. I have filled out your form for a health aide as well. - Please call the clinic at [phone] or return to the ED if you develop fever, chills, shortness of breath, sudden confusion or weakness.

## 2013-06-20 NOTE — Progress Notes (Signed)
Pt with d/c orders to home. Pt education completed with patient. PIV removed and intact. Pt with all belongings and prescriptions upon discharge homes. Family at bedside. Pt verbalized understanding of all teaching, with teach back method. Pt with no further questions and stable upon discharge.

## 2013-06-20 NOTE — Progress Notes (Signed)
Subjective: Patient seen and examined at the bedside this morning. She feels great and is in good spirits. She denies SOB, cough, hemoptysis. She has no personal history of TB but says "it runs in my family." Still will leg swelling but ambulating about room with no issue.  Objective: Vital signs in last 24 hours: Filed Vitals:   06/19/13 2143 06/20/13 0440 06/20/13 0500 06/20/13 0658  BP: 181/83 185/98  168/88  Pulse: 66 55  54  Temp: 98.5 F (36.9 C) 97.9 F (36.6 C)    TempSrc: Oral Oral    Resp: 18 18    Height:      Weight:   198 lb 3.1 oz (89.9 kg)   SpO2: 100% 100%     Weight change: -6 lb 2.8 oz (-2.8 kg)  Intake/Output Summary (Last 24 hours) at 06/20/13 0914 Last data filed at 06/20/13 3734  Gross per 24 hour  Intake    120 ml  Output   1250 ml  Net  -1130 ml   General: Not in acute distress. HEENT: PERRL, EOMI, scleral icterus, No JVD or bruit Cardiac: S1/S2, RRR, No murmurs, gallops or rubs Pulm: Good air movement bilaterally. Clear to auscultation bilaterally. No rales, wheezing, rhonchi or rubs. Abd: Soft, nondistended, nontender, no rebound pain, no organomegaly, BS present Ext: 3+ pitting leg edema bilaterally.  2+DP/PT pulse bilaterally Musculoskeletal: No joint deformities, erythema, or stiffness, ROM full Skin: No rashes.  Neuro: Alert and oriented X3, cranial nerves II-XII grossly intact, muscle strength 5/5 in all extremeties, sensation to light touch intact. Brachial reflex 2+ bilaterally.   Psych: Patient is not psychotic, no suicidal or hemocidal ideation.  Lab Results: Basic Metabolic Panel:  Recent Labs Lab 06/18/13 1210 06/19/13 0500 06/19/13 1841 06/20/13 0515  NA 141 138  --  138  K 3.4* 4.1  --  4.2  CL 110 107  --  105  CO2 15* 14*  --  18*  GLUCOSE 76 236*  --  200*  BUN 60* 54* 51* 52*  CREATININE 1.95* 1.78* 1.73* 1.75*  CALCIUM 7.8* 7.6*  --  7.5*  MG 1.6  --   --  1.7   Liver Function Tests:  Recent Labs Lab  06/19/13 0500 06/20/13 0515  AST 119* 52*  ALT 153* 128*  ALKPHOS 169* 175*  BILITOT 0.3 0.3  PROT 5.4* 5.6*  ALBUMIN 2.1* 2.2*   No results found for this basename: LIPASE, AMYLASE,  in the last 168 hours No results found for this basename: AMMONIA,  in the last 168 hours CBC:  Recent Labs Lab 06/18/13 1210  WBC 7.3  NEUTROABS 4.9  HGB 8.9*  HCT 25.9*  MCV 100.0  PLT 152   Cardiac Enzymes:  Recent Labs Lab 06/18/13 1210  TROPONINI <0.30   BNP:  Recent Labs Lab 06/18/13 1210  PROBNP 374.8*   D-Dimer: No results found for this basename: DDIMER,  in the last 168 hours CBG:  Recent Labs Lab 06/19/13 1152 06/19/13 1648 06/19/13 2112 06/20/13 0023 06/20/13 0439 06/20/13 0801  GLUCAP 377* 207* 228* 192* 207* 163*   Hemoglobin A1C: No results found for this basename: HGBA1C,  in the last 168 hours Fasting Lipid Panel: No results found for this basename: CHOL, HDL, LDLCALC, TRIG, CHOLHDL, LDLDIRECT,  in the last 168 hours Thyroid Function Tests: No results found for this basename: TSH, T4TOTAL, FREET4, T3FREE, THYROIDAB,  in the last 168 hours Coagulation:  Recent Labs Lab 06/18/13 1210  LABPROT 12.9  INR 0.99   Anemia Panel: No results found for this basename: VITAMINB12, FOLATE, FERRITIN, TIBC, IRON, RETICCTPCT,  in the last 168 hours Urine Drug Screen: Drugs of Abuse     Component Value Date/Time   LABOPIA POSITIVE* 05/28/2013 1737   COCAINSCRNUR NONE DETECTED 05/28/2013 1737   LABBENZ NONE DETECTED 05/28/2013 1737   AMPHETMU NONE DETECTED 05/28/2013 1737   THCU NONE DETECTED 05/28/2013 1737   LABBARB NONE DETECTED 05/28/2013 1737    Alcohol Level: No results found for this basename: ETH,  in the last 168 hours Urinalysis: No results found for this basename: COLORURINE, APPERANCEUR, LABSPEC, PHURINE, GLUCOSEU, HGBUR, BILIRUBINUR, KETONESUR, PROTEINUR, UROBILINOGEN, NITRITE, LEUKOCYTESUR,  in the last 168 hours Misc. Labs:  Micro  Results: No results found for this or any previous visit (from the past 240 hour(s)). Studies/Results: X-Shuman Chest Pa And Lateral   06/18/2013   ADDENDUM REPORT: 06/18/2013 22:32  ADDENDUM: There is a questionable 1 cm nodular density along the anterior lower chest on the lateral view. This could be contributing to the nodular density on the frontal view but may not account for the entire density. A chest CT would be most definitive to exclude a pulmonary nodule.   Electronically Signed   By: Richarda Overlie M.D.   On: 06/18/2013 22:32   06/18/2013   CLINICAL DATA:  Chest pain and shortness of breath.  EXAM: CHEST  2 VIEW  COMPARISON:  05/10/2013  FINDINGS: Two views of the chest were obtained. This is a 2.1 cm nodular structure in the retrocardiac space on the frontal view. However, a nodule is not clearly identified on the lateral view. This could represent overlying shadows. Heart size is normal. Lungs are clear without airspace disease or edema. There is probably mild basilar atelectasis. No evidence for pleural effusions.  IMPRESSION: No acute chest findings.  Indeterminate 2.1 cm nodular structure in the retrocardiac region on the frontal view. However, there is not a correlating lesion on the lateral view and this may represent overlying structures. Recommend a follow-up two view chest radiograph to confirm that this represents overlying structures rather than a true nodule.  Electronically Signed: By: Richarda Overlie M.D. On: 06/18/2013 22:07   Ct Chest Wo Contrast  06/19/2013   CLINICAL DATA:  Pulmonary nodule.  EXAM: CT CHEST WITHOUT CONTRAST  TECHNIQUE: Multidetector CT imaging of the chest was performed following the standard protocol without IV contrast.  COMPARISON:  Chest x-Stemm dated 06/18/2013  FINDINGS: There is a 25 x 17 x 18 mm poorly defined cavitary lesion in the left lower lobe with peripheral linear atelectasis extending from the area of the lesion. There is no hilar or mediastinal adenopathy.  There is slight linear atelectasis at the right lung base. Heart size is normal. There appears to be a coronary artery stent in place.  No osseous abnormality.  The visualized portion of the upper abdomen is normal.  IMPRESSION: 2.5 cm cavitary poorly defined nodular density in the left lower lobe. I suspect this represents atypical infection in this patient with HIV disease.  Malignancy is felt to be less likely.   Electronically Signed   By: Geanie Cooley M.D.   On: 06/19/2013 16:34   US Abdomen Complete  06/19/2013   CLINICAL DATA:  Abdominal pain, elevated LFTs  EXAM: ULTRASOUND ABDOMEN COMPLETE  COMPARISON:  05/28/2003  FINDINGS: Gallbladder:  No gallstones, gallbladder wall thickening, or pericholecystic fluid. Negative sonographic Murphy's sign.  Common bile duct:  Diameter: 4 mm.  Liver:  Mildly nodular hepatic contour (for example, image 19). No focal hepatic lesion is seen.  IVC:  No abnormality visualized.  Pancreas:  Visualized portions are unremarkable.  Spleen:  Measures 6.2 cm.  Right Kidney:  Length: 12.3 cm.  No mass or hydronephrosis.  Left Kidney:  Length: 11.6 cm.  No mass or hydronephrosis.  Abdominal aorta:  No aneurysm visualized.  Other findings:  None.  IMPRESSION: Mildly nodular hepatic contour, raising the possibility of cirrhosis.  Otherwise, negative abdominal ultrasound.   Electronically Signed   By: Charline Bills M.D.   On: 06/19/2013 16:08   Medications:  Scheduled Meds: . abacavir  600 mg Oral Daily  . acyclovir  800 mg Oral Daily  . aspirin EC  81 mg Oral Daily  . cholecalciferol  1,000 Units Oral Daily  . cloNIDine  0.2 mg Oral BID  . darunavir  800 mg Oral Q breakfast  . furosemide  80 mg Intravenous BID  . heparin  5,000 Units Subcutaneous Q8H  . insulin aspart  0-9 Units Subcutaneous Q4H  . insulin glargine  10 Units Subcutaneous Daily  . labetalol  100 mg Oral BID  . lamiVUDine  150 mg Oral Daily  . levETIRAcetam  1,500 mg Oral Q12H  . lisinopril  40 mg  Oral Daily  . LORazepam  1 mg Oral Daily  . methocarbamol  1,000 mg Oral BID  . pantoprazole  40 mg Oral Daily  . phenytoin  100 mg Oral TID  . predniSONE  40 mg Oral Q breakfast  . ritonavir  100 mg Oral Q breakfast  . sodium chloride  3 mL Intravenous Q12H  . sodium chloride  3 mL Intravenous Q12H   Continuous Infusions:  PRN Meds:.sodium chloride, hydrALAZINE, oxyCODONE, sodium chloride, triamcinolone cream Assessment/Plan:  #Acute on chronic diastolic heart failure - Patient's symptoms of worsening leg edema, weight gain, shortness of breath are most likely caused by diastolic heart failure exacerbation. Echo 05/31/13 showed normal systolic function but grade 2 diastolic dysfunction. We will also evaluate for nephrotic syndrome given history of diabetes and hepatitis C. Patient reports that she has been compliant to her home medications, including Lasix 40mg  po daily. Recent prednisone use may have contributed to fluid retention. She has been diuresing well with negative I/O of 2.6 L to date. Weight is down to 198lbs. Her Cr is stable, see below. Patient still has significant leg edema (3+pitting edema). Trop negative x 1.  - Changing Lasix dosage to 80 mg po BID - Continue home aspirin 81 mg daily, BB, lisinopril - Urine micro albumin/Cr ratio - Urinalysis - Discontinue repeat echo given recent study this month - Consult to social work for home health needs, I have filled out a form from [employer] for a health aide for her - PT eval and treat to determine if any additional home health needs - BMP twice daily while diuresing  Creatinine, Ser  Date Value Range Status  06/20/2013 1.75* 0.50 - 1.10 mg/dL Final  1/61/0960 4.54* 0.50 - 1.10 mg/dL Final  0/98/1191 4.78* 0.50 - 1.10 mg/dL Final  2/95/6213 0.86* 0.50 - 1.10 mg/dL Final   Filed Weights   06/19/13 0555 06/20/13 0500  Weight: 204 lb 5.9 oz (92.7 kg) 198 lb 3.1 oz (89.9 kg)    #: Pulmonary Nodule on CXR: >1.0 cm, and has been  present on prior films. Chest CT yesterday showed 2.5 cm cavitary poorly defined nodular density in the left lower lobe. Patient denies history of TB  but has a significant ID history, - Pulm consult  #Low CO2: Chronic issue, near baseline of 19-21. Likely 2/2 CKD. She may eventually need alkali therapy to maintain the serum bicarbonate concentration in the normal range (sodium bicarbonate 0.5 to 1 meq/kg per day) but we will defer this to the outpatient setting.  CO2  Date Value Range Status  06/20/2013 18* 19 - 32 mEq/L Final  06/19/2013 14* 19 - 32 mEq/L Final  06/18/2013 15* 19 - 32 mEq/L Final  06/18/2013 14* 19 - 32 mEq/L Final  06/11/2013 15* 19 - 32 mEq/L Final    #Elevated LFTs: Most likely related to HCV.  Patient is also on many medications that can cause liver damage. Yesterday she had a benign RUQ ultrasound. - Continue oxycodone without acetaminophen for pain control - Daily CMET    #HTN - History of poor control. Decent control for her now at 160s/80s. - labetalol 100 mg BID   - clonidine 0.2 mg BID   - lisinopril 40 mg daily  - hydralazine 5 mg IV prn for SBP>185 mmHg.   #: T2DM/hypoglycemia - CBG trend below. - Continue lantus 10 U daily - SSI-sensitive  Glucose-Capillary  Date Value Range Status  06/20/2013 163* 70 - 99 mg/dL Final  1/61/09601/22/2015 454207* 70 - 99 mg/dL Final  0/98/11911/22/2015 478192* 70 - 99 mg/dL Final  2/95/62131/21/2015 086228* 70 - 99 mg/dL Final  5/78/46961/21/2015 295207* 70 - 99 mg/dL Final    #HIV - most recent CD4 was 490 and viral load <20 on 05/07/13.  - continue home ART - lopinovir-ritonavir 200-50 mg per tablet, 2 tablets po BID   - abacavir-lamivudine 600-300 mg one tablet po daily   - acyclovir 800 mg daily    #AIHA: diagnosed during recent admission. Hgb stable 8.9-->9.0.  - continue prednisone taper: 40 mg daily today, then decrease to 20 mg daily on 1/28 for 1 month, then re-evaluate with CBC, haptoglobin, LDH before discontinuing. - Will arrange for heme follow up  with Dr. Cyndie ChimeGranfortuna  #Seizures: continue home meds. Phenytoin level low (3.8) but with low albumin, corrects to 7.3 per clinical pharmacist. No true therapeutic target range for Keppra per clinical pharmacist. Will continue home dosing but will consolidate phenytoin to encourage adherence. - levetiracetam 1500 mg po Q12   - phenytoin 300 mg po QHS  #CKD: Cr improved 1.95--> 1.75 today.  - limit new nephrotoxic meds.   - will aggressively diurese while continuing to follow Cr  #Diarrhea: Patient c/o diarrhea on admission but no episodes yesterday or today. - Discontinue C. diff   - Discontinue enteric precautions   - Continue to monitor    #Chronic pain:  - home methocarbamol 100 mg BID   - Home Norco changed to oxycodone 10 mg po PRN severe pain    #Anxiety: Stable. - continue home lorazepam 1 mg daily  FEN/GI: patient currently fluid overloaded and with low BG. Low albumin and total protein - reflective of likely protein-calorie malnutrition.   - carb modified diet   - holding IV fluids for now   - continue home vitamin D supplementation   - pantoprazole 40 mg daily for GERD    PPx:  - subcutaneous heparin 5000 units TID      LOS: 2 days   Vivi BarrackCater, Matasha Smigelski 06/20/2013, 9:14 AM  Vivi BarrackSarah Dyllen Menning, MD  [email or web address] Pager # [phone] Office # [phone]

## 2013-06-20 NOTE — Consult Note (Signed)
Name: Michelle [name] MRN: [id number] DOB: November 28, 1960    ADMISSION DATE:  06/18/2013 CONSULTATION DATE: 1-21  REFERRING MD : IMTS PRIMARY SERVICE: IMTS  CHIEF COMPLAINT: Gout pain  BRIEF PATIENT DESCRIPTION:   Admitted to [facility] 1/20 with CC of gout pain, lower ext edema and weight gain.  Cxr revealed LLL opacity and CT scan chest revealed LLL 2.5 cm cavitary nodule.   HISTORY OF PRESENT ILLNESS:   Ms.  Bastyr is a 53 yo AAF with a host of medical problems who was recently discharged from [facility] hospital after treatment for malignant hypertension(suspected poor compliance) which required ICU admit.  She was evaluated at that time by [facility] service while in ICU. She returns to [facility] 1[facility]20 with CC: of gout pain, lower ext edema and weight gain.  Cxr revealed lll opacity and CT scan chest revealed lll 2.5 cm cavitary nodule. Her gout is under better control and she is in no distress. PCCM asked to evaluate new incidental finding in HIV patient.  PAST MEDICAL HISTORY :  Past Medical History  Diagnosis Date  . Seizures   . Stroke   . Meningitis   . HIV (human immunodeficiency virus infection)   . Hypertension   . Gout   . Muscle spasms of head and/or neck   . CKD (chronic kidney disease)   . CHF (congestive heart failure)     [name] 06/18/2013  . HCV (hepatitis C virus)     chronic/notes 06/18/2013  . Type II diabetes mellitus     [name] 06/18/2013  . AIHA (autoimmune hemolytic anemia)     [name] 06/18/2013  . Hypertensive encephalopathy ~ 05/2013    hospitalaized/notes 06/18/2013  . Daily headache     "for the last 6 years/notes 06/18/2013  . Exertional shortness of breath     [name] 06/18/2013  . Anxiety     [name] 06/18/2013   Past Surgical History  Procedure Laterality Date  . Hip pinning Right    Prior to Admission medications   Medication Sig Start Date End Date Taking? Authorizing Provider  abacavir-lamiVUDine (EPZICOM) 600-300 MG per tablet Take 1 tablet by mouth daily. 06/02/13  Yes  Lesly Dukes, MD  acyclovir (ZOVIRAX) 800 MG tablet Take 1 tablet (800 mg total) by mouth daily. 06/02/13  Yes Lesly Dukes, MD  aspirin EC 81 MG tablet Take 1 tablet (81 mg total) by mouth daily. 06/02/13  Yes Lesly Dukes, MD  cholecalciferol (VITAMIN D) 1000 UNITS tablet Take 1,000 Units by mouth daily.   Yes Historical Provider, MD  cloNIDine (CATAPRES) 0.2 MG tablet Take 1 tablet (0.2 mg total) by mouth 2 (two) times daily. 06/02/13  Yes Lesly Dukes, MD  furosemide (LASIX) 40 MG tablet Take 1 tablet (40 mg total) by mouth daily. 06/02/13  Yes Lesly Dukes, MD  HYDROcodone-acetaminophen (NORCO) 10-325 MG per tablet Take 1 tablet by mouth every 6 (six) hours as needed for moderate pain.   Yes Historical Provider, MD  insulin aspart (NOVOLOG) 100 UNIT/ML injection Inject into the skin 3 (three) times daily before meals. Home Sliding scale   Yes Historical Provider, MD  insulin glargine (LANTUS) 100 UNIT/ML injection Inject 30 Units into the skin at bedtime.   Yes Historical Provider, MD  labetalol (NORMODYNE) 100 MG tablet Take 1 tablet (100 mg total) by mouth 2 (two) times daily. 06/02/13  Yes Lesly Dukes, MD  levETIRAcetam (KEPPRA) 500 MG tablet Take 3 tablets (1,500 mg total) by mouth every 12 (twelve) hours. 06/02/13  Yes Lesly Dukes, MD  lisinopril (PRINIVIL,ZESTRIL) 40 MG tablet Take 1 tablet (40 mg total) by mouth daily. 06/02/13  Yes Lesly Dukes, MD  lopinavir-ritonavir (KALETRA) 200-50 MG per tablet Take 2 tablets by mouth 2 (two) times daily. 06/02/13  Yes Lesly Dukes, MD  LORazepam (ATIVAN) 1 MG tablet Take 1 mg by mouth daily. Take one tablet for seizure prevention.   Yes Historical Provider, MD  methocarbamol (ROBAXIN) 500 MG tablet Take 1,000 mg by mouth every 6 (six) hours as needed for muscle spasms.   Yes Historical Provider, MD  pantoprazole (PROTONIX) 40 MG tablet Take 1 tablet (40 mg total) by mouth daily. 06/02/13  Yes Lesly Dukes, MD  phenytoin (DILANTIN) 50 MG tablet Chew 2 tablets (100 mg total)  by mouth 3 (three) times daily. 06/02/13  Yes Lesly Dukes, MD  predniSONE (DELTASONE) 20 MG tablet Take 3 tablets (60 mg total) by mouth daily with breakfast. 06/02/13  Yes Lesly Dukes, MD  triamcinolone cream (KENALOG) 0.1 % Apply 1 application topically 2 (two) times daily as needed. For rash.   Yes Historical Provider, MD  Blood Glucose Monitoring Suppl (ACCU-CHEK AVIVA PLUS) W/DEVICE KIT USE AS DIRECTED 05/24/13   Dominic Pea, DO  glucose blood (ACCU-CHEK AVIVA) test strip Use as instructed 06/13/13   Clinton Gallant, MD   Allergies  Allergen Reactions  . 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 Norvasc [Amlodipine Besylate]     Itching, rash , hives .   [name] Morphine And Related Hives, Itching and Rash    FAMILY HISTORY:  Family History  Problem Relation Age of Onset  . Cancer - Colon Mother   . Cancer Father   . Diabetes    . Hypertension Father    SOCIAL HISTORY:  reports that she quit smoking about 3 years ago. Her smoking use included Cigarettes. She smoked 0.00 packs per day. She has never used smokeless tobacco. She reports that she does not drink alcohol or use illicit drugs.  REVIEW OF SYSTEMS:   10 point review of system taken, please see HPI for positives and negatives.   SUBJECTIVE:  NAD at rest VITAL SIGNS: Temp:  [97.8 F (36.6 C)-98.5 F (36.9 C)] 97.9 F (36.6 C) (01/22 0440) Pulse Rate:  [54-66] 54 (01/22 0658) Resp:  [18] 18 (01/22 0440) BP: (168-185)/(83-98) 168/88 mmHg (01/22 0658) SpO2:  [100 %] 100 % (01/22 0440) Weight:  [198 lb 3.1 oz (89.9 kg)] 198 lb 3.1 oz (89.9 kg) (01/22 0500) HEMODYNAMICS:   VENTILATOR SETTINGS:   INTAKE / OUTPUT: Intake/Output     01/21 0701 - 01/22 0700 01/22 0701 - 01/23 0700   P.O. 120    Total Intake(mL/kg) 120 (1.3)    Urine (mL/kg/hr) 1250 (0.6)    Total Output 1250     Net -1130            PHYSICAL EXAMINATION: General:  Pleasant middle aged AAF , NAD @ rest, poor  insight into her illness. Neuro:  Intact. Mae x 4.  HEENT:  PERL. NO LAN/JVD. Poor dentation Cardiovascular:  hsr rrr Lungs:  CTA Abdomen:  Soft +bs Musculoskeletal:  Intact. Rt great toe inflamed Skin: warm  + lower ext edema  LABS: I have reviewed all of today's lab results. Relevant abnormalities are discussed in the A/P section  CXR 1/20: 2 cm LLL nodule not well visualized on lateral film  CT chest: 2.5 cm cavitary poorly defined nodular density  in the left lower lobe.     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 Therefore, I am inclined to think that what we are seeing is residual scarring/benign nodule related to that illness. However, since we don't have those Xrays to view (they have been purged) and in setting of HIV, the risk of TB and fungal causes cannot be definitively excluded and in the setting of prior smoking, malignancy is not definitively excluded. Therefore, it would be prudent to follow closely with serial Xrays.    Plan: -PPD or quant gold now -OK for discharge @ any time from pulmonary perspective -Limited, non-contrasted CT chest in 6 wks -Then, limited non-contrasted CT chest q 3 months for one year -If at any time, the LLL nodule progresses in size, referral to pulmonary medicine as outpt would be indicated for FOB and transbronchial biopsy  Discussed with House Staff PCCM will sign off. Please call if we can be of further assistance    Merton Border, MD ; [facility] -  [phone].  After 5:30 PM or weekends, call [phone]

## 2013-06-21 ENCOUNTER — Ambulatory Visit (INDEPENDENT_AMBULATORY_CARE_PROVIDER_SITE_OTHER): Payer: Medicaid Other | Admitting: *Deleted

## 2013-06-21 ENCOUNTER — Encounter: Payer: Self-pay | Admitting: Internal Medicine

## 2013-06-21 ENCOUNTER — Other Ambulatory Visit (HOSPITAL_COMMUNITY)
Admission: RE | Admit: 2013-06-21 | Discharge: 2013-06-21 | Disposition: A | Discharge status: Home / Self Care | Payer: Medicaid Other | Source: Ambulatory Visit | Attending: Internal Medicine | Admitting: Internal Medicine

## 2013-06-21 DIAGNOSIS — Z1231 Encounter for screening mammogram for malignant neoplasm of breast: Secondary | ICD-10-CM

## 2013-06-21 DIAGNOSIS — Z124 Encounter for screening for malignant neoplasm of cervix: Secondary | ICD-10-CM

## 2013-06-21 DIAGNOSIS — Z01419 Encounter for gynecological examination (general) (routine) without abnormal findings: Secondary | ICD-10-CM | POA: Insufficient documentation

## 2013-06-21 NOTE — Progress Notes (Signed)
  Subjective:     Michelle [name] is a 53 y.o. woman who comes in today for a  pap smear only. Previous abnormal Pap smears: no. Contraception: menopausal, condoms.  Objective:    There were no vitals taken for this visit. Pelvic Exam:  Pap smear obtained.   Assessment:    Screening pap smear.   Plan:    Follow up in one year, or as indicated by Pap results.  Pt given educational materials re: HIV and women, self-esteem, BSE, nutrition and diet management, PAP smears and partner safety.

## 2013-06-21 NOTE — Discharge Summary (Signed)
  Date: 06/21/2013  Patient name: Michelle [name]  Medical record number: [id number]  Date of birth: 11-03-1960   This patient has been seen and the plan of care was discussed with the house staff. Please see their note for complete details. I concur with their findings with the following additions/corrections: She has evidence of nephrotic range proteinuria. 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 Refer to nephrology as outpatient. She will need treatment which includes diuretic, ACE-i +/- non-dihydropiridine CCB, statin. It would be helpful to obtain C3, C4, CH50, ANA, RPR, etc. She has likely etiologies (Hep C, HIV) so a renal biopsy especially given her stable renal function is likely not necessary. Agree with plan above.  Jonah Blue, DO, FACP Faculty [facility] Internal Medicine Residency Program 06/21/2013, 11:44 AM

## 2013-06-21 NOTE — Patient Instructions (Signed)
Your pap smear results will take about a week.  I will mail them to you later.  Thank you for coming to the Center for your care.  Angelique Blonder, RN

## 2013-06-22 NOTE — Progress Notes (Signed)
I saw and evaluated the patient.  I personally confirmed the key portions of Dr. Sadek's history and exam and reviewed pertinent patient test results.  The assessment, diagnosis, and plan were formulated together and I agree with the documentation in the resident's note. 

## 2013-06-24 ENCOUNTER — Telehealth: Payer: Self-pay | Admitting: Dietician

## 2013-06-24 NOTE — Telephone Encounter (Signed)
Calling to assist with transition of care from hospital to home. Discharge date:06-20-13 Call date: 06-24-13 Hospital follow up appointment date: 06-26-13 with Dr. Burtis Junes at  Discharge medications reviewed: no Able to fill all prescriptions?- says she could not fill 2 and does not remember which ones. Too sleepy to talk, says she'll call back tomorrow

## 2013-06-25 ENCOUNTER — Encounter: Payer: Self-pay | Admitting: *Deleted

## 2013-06-26 ENCOUNTER — Ambulatory Visit (INDEPENDENT_AMBULATORY_CARE_PROVIDER_SITE_OTHER): Payer: Medicaid Other | Admitting: Internal Medicine

## 2013-06-26 VITALS — BP 131/74 | HR 78 | Temp 97.8°F | Wt 209.5 lb

## 2013-06-26 DIAGNOSIS — D591 Autoimmune hemolytic anemia, unspecified: Secondary | ICD-10-CM

## 2013-06-26 DIAGNOSIS — D599 Acquired hemolytic anemia, unspecified: Secondary | ICD-10-CM

## 2013-06-26 DIAGNOSIS — R600 Localized edema: Secondary | ICD-10-CM

## 2013-06-26 DIAGNOSIS — R609 Edema, unspecified: Secondary | ICD-10-CM

## 2013-06-26 DIAGNOSIS — I5031 Acute diastolic (congestive) heart failure: Secondary | ICD-10-CM

## 2013-06-26 DIAGNOSIS — N049 Nephrotic syndrome with unspecified morphologic changes: Secondary | ICD-10-CM

## 2013-06-26 DIAGNOSIS — E119 Type 2 diabetes mellitus without complications: Secondary | ICD-10-CM

## 2013-06-26 DIAGNOSIS — J984 Other disorders of lung: Secondary | ICD-10-CM

## 2013-06-26 DIAGNOSIS — R911 Solitary pulmonary nodule: Secondary | ICD-10-CM

## 2013-06-26 LAB — CBC
HCT: 25.6 % — ABNORMAL LOW (ref 36.0–46.0)
Hemoglobin: 8.8 g/dL — ABNORMAL LOW (ref 12.0–15.0)
MCH: 34.1 pg — ABNORMAL HIGH (ref 26.0–34.0)
MCHC: 34.4 g/dL (ref 30.0–36.0)
MCV: 99.2 fL (ref 78.0–100.0)
Platelets: 156 10*3/uL (ref 150–400)
RBC: 2.58 MIL/uL — ABNORMAL LOW (ref 3.87–5.11)
RDW: 18.8 % — AB (ref 11.5–15.5)
WBC: 7.3 10*3/uL (ref 4.0–10.5)

## 2013-06-26 LAB — GLUCOSE, CAPILLARY: GLUCOSE-CAPILLARY: 127 mg/dL — AB (ref 70–99)

## 2013-06-26 LAB — COMPLETE METABOLIC PANEL WITH GFR
ALBUMIN: 2.6 g/dL — AB (ref 3.5–5.2)
ALT: 101 U/L — ABNORMAL HIGH (ref 0–35)
AST: 58 U/L — ABNORMAL HIGH (ref 0–37)
Alkaline Phosphatase: 186 U/L — ABNORMAL HIGH (ref 39–117)
BUN: 71 mg/dL — ABNORMAL HIGH (ref 6–23)
CALCIUM: 7.6 mg/dL — AB (ref 8.4–10.5)
CHLORIDE: 109 meq/L (ref 96–112)
CO2: 17 mEq/L — ABNORMAL LOW (ref 19–32)
Creat: 2.13 mg/dL — ABNORMAL HIGH (ref 0.50–1.10)
GFR, Est African American: 30 mL/min — ABNORMAL LOW
GFR, Est Non African American: 26 mL/min — ABNORMAL LOW
Glucose, Bld: 125 mg/dL — ABNORMAL HIGH (ref 70–99)
POTASSIUM: 4.1 meq/L (ref 3.5–5.3)
SODIUM: 136 meq/L (ref 135–145)
Total Bilirubin: 0.6 mg/dL (ref 0.2–1.2)
Total Protein: 5.3 g/dL — ABNORMAL LOW (ref 6.0–8.3)

## 2013-06-26 MED ORDER — FUROSEMIDE 80 MG PO TABS: 80.0000 mg | ORAL_TABLET | Freq: Every day | ORAL | Status: DC

## 2013-06-26 MED ORDER — FUROSEMIDE 40 MG PO TABS: 40.0000 mg | ORAL_TABLET | Freq: Every day | ORAL | Status: DC

## 2013-06-26 NOTE — Assessment & Plan Note (Signed)
Pt on Prednisone daily. Checking CBC, and if Hgb is stable, her Prednisone can be decreased to 20mg  daily.

## 2013-06-26 NOTE — Assessment & Plan Note (Addendum)
She will need limited CT in 6 weeks (around March 5), then every 3 months for next year. Checking quantiferon gold today per request of hospital team as it was unable to be drawn while in the hospital.

## 2013-06-26 NOTE — Patient Instructions (Addendum)
**  Begin taking Lasix 80mg  in the evening and continue to take 40mg  of Lasix in the morning until you are seen back in the clinic on Friday. Return to the clinic on Friday.   **Avoid adding salt to your food and try to eat fresh foods, avoiding canned foods when possible.   **Try to get a scale so you can weigh yourself daily.   **If your weight increases, you begin to have chest pain, your abdomen swells, or you begin to have worsening of the swelling in your legs, please call the clinic or go to the Emergency Department for evaluation.  **If your swelling does not improve, or if your weight continues to increase, you may need to be admitted to the hospital for stronger diuresis to get the fluid off.

## 2013-06-26 NOTE — Progress Notes (Signed)
Patient ID: Michelle [name], female   DOB: [birth year], 53 y.o.   MRN: [id number]  Subjective:   Patient ID: Michelle [name] female   DOB: Nov 21, 1960 53 y.o.   MRN: [id number]  HPI: [name] is a 53 y.o. F w/ PMH CHF (EF normal, with Grade-2 diastolic dysfunction), HIV (CD4 490 and VL<20 on 05/07/13), chronic HCV, seizure disorder, hx of stroke, HTN, T2DM, CKD, gout, and AIHA, who presents for a hospital f/u after being admitted on 1/20 and d/c'd on 1/22 for a CHF exacerbation.  She was diuresed with IV Lasix and her home dose of Lasix was increased to $RemoveBefo'40mg'mvArYxBhqHm$  BID, which she was to continue after discharge. 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 Per the patient and her caregivers, she was doing well on the increased Lasix and her edema had improved until over the past few days, but now the edema is beginning to increase (since 2/24) and her skin is weeping. Her care give states that her BLE edema is not as significant as before admission, but it is continuing to worsen. She endorses compliance with her increased Lasix but does also endorse dietary indiscretions. She is using added salt to her food. Her wieght is up 11lb from her hospital d/c.   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 The hospital team requested that she be referred to nephrology as outpatient.   Past Medical History  Diagnosis Date  . Seizures   . Stroke   . Meningitis   . HIV (human immunodeficiency virus infection)   . Hypertension   . Gout   . Muscle spasms of head and/or neck   . CKD (chronic kidney disease)   . CHF (congestive heart failure)     [name] 06/18/2013  . HCV (hepatitis C virus)     chronic/notes 06/18/2013  . Type II diabetes mellitus     [name] 06/18/2013  . AIHA (autoimmune  hemolytic anemia)     [name] 06/18/2013  . Hypertensive encephalopathy ~ 05/2013    hospitalaized/notes 06/18/2013  . Daily headache     "for the last 6 years/notes 06/18/2013  . Exertional shortness of breath     [name] 06/18/2013  . Anxiety     [name] 06/18/2013  . Nephrotic syndrome   . History of syphilis    Current Outpatient Prescriptions  Medication Sig Dispense Refill  . abacavir (ZIAGEN) 300 MG tablet Take 2 tablets (600 mg total) by mouth daily.  60 tablet  6  . acyclovir (ZOVIRAX) 800 MG tablet Take 1 tablet (800 mg total) by mouth daily.  30 tablet  3  . aspirin EC 81 MG tablet Take 1 tablet (81 mg total) by mouth daily.  30 tablet  11  . Blood Glucose Monitoring Suppl (ACCU-CHEK AVIVA PLUS) W/DEVICE KIT USE AS DIRECTED  1 kit  0  . cholecalciferol (VITAMIN D) 1000 UNITS tablet Take 1,000 Units by mouth daily.      . cloNIDine (CATAPRES) 0.2 MG tablet Take 1 tablet (0.2 mg total) by mouth 2 (two) times daily.  60 tablet  3  . Darunavir Ethanolate (PREZISTA) 800 MG tablet Take 1 tablet (800 mg total) by mouth daily with breakfast.  30 tablet  6  . furosemide (LASIX) 40 MG tablet Take 1  tablet (40 mg total) by mouth 2 (two) times daily.  60 tablet  3  . glucose blood (ACCU-CHEK AVIVA) test strip Use as instructed  100 each  12  . hydrALAZINE (APRESOLINE) 50 MG tablet Take 50 mg by mouth 3 (three) times daily.      [name] HYDROcodone-acetaminophen (NORCO) 10-325 MG per tablet Take 1 tablet by mouth every 6 (six) hours as needed.      . insulin aspart (NOVOLOG) 100 UNIT/ML injection Inject into the skin 3 (three) times daily before meals. Home Sliding scale      . insulin glargine (LANTUS) 100 UNIT/ML injection Inject 30 Units into the skin at bedtime.      [name] labetalol (NORMODYNE) 100 MG tablet Take 1 tablet (100 mg total) by mouth 2 (two) times daily.  60 tablet  3  . lamiVUDine (EPIVIR) 150 MG tablet Take 1 tablet (150 mg total) by mouth daily.  30 tablet  6  . levETIRAcetam (KEPPRA) 500  MG tablet Take 3 tablets (1,500 mg total) by mouth every 12 (twelve) hours.  180 tablet  3  . lisinopril (PRINIVIL,ZESTRIL) 40 MG tablet Take 1 tablet (40 mg total) by mouth daily.  30 tablet  3  . LORazepam (ATIVAN) 1 MG tablet Take 1 mg by mouth daily. Take one tablet for seizure prevention.      . methocarbamol (ROBAXIN) 500 MG tablet Take 1,000 mg by mouth every 6 (six) hours as needed for muscle spasms.      [name] oxyCODONE 10 MG TABS Take 1 tablet (10 mg total) by mouth every 6 (six) hours as needed for severe pain.  30 tablet  0  . pantoprazole (PROTONIX) 40 MG tablet Take 1 tablet (40 mg total) by mouth daily.  30 tablet  11  . phenytoin (DILANTIN) 50 MG tablet Chew 2 tablets (100 mg total) by mouth 3 (three) times daily.  180 tablet  3  . predniSONE (DELTASONE) 20 MG tablet Take 2 tablets (40 mg total) by mouth daily with breakfast.  60 tablet  0  . ritonavir (NORVIR) 100 MG TABS tablet Take 1 tablet (100 mg total) by mouth daily with breakfast.  30 tablet  6  . triamcinolone cream (KENALOG) 0.1 % Apply 1 application topically 2 (two) times daily as needed. For rash.       No current facility-administered medications for this visit.   Family History  Problem Relation Age of Onset  . Cancer - Colon Mother   . Cancer Father   . Diabetes    . Hypertension Father    History   Social History  . Marital Status: Single    Spouse Name: N/A    Number of Children: 4  . Years of Education: 11th   Social History Main Topics  . Smoking status: Former Smoker    Types: Cigarettes    Quit date: 06/19/2010  . Smokeless tobacco: Never Used  . Alcohol Use: No  . Drug Use: No     Comment: past history of alcohol, cocaine and IV drug use  . Sexual Activity: Not Currently    Partners: Male     Comment: given condoms   Other Topics Concern  . Not on file   Social History Narrative   Patient lives at home with sister.    Patient is unemployed.    Patient is single.    Patient has 2 alive  and 2 deceased.    Patient has 11th grade education.  Review of Systems: Constitutional: Denies fever, chills, and fatigue.  Respiratory: +chronic SOB, unchanged Cardiovascular: Denies chest pain. +worseing leg swelling.  Gastrointestinal: Denies nausea, vomiting, abdominal pain, diarrhea, constipation Genitourinary: Denies dysuria Skin: +itching of BLE.   Neurological: Denies dizziness, light-headedness Psychiatric/Behavioral: Denies suicidal ideation, mood changes,  Objective:  Physical Exam: Filed Vitals:   06/26/13 1058  BP: 131/74  Pulse: 78  Temp: 97.8 F (36.6 C)  TempSrc: Oral  Weight: 209 lb 8 oz (95.029 kg)  SpO2: 100%   Constitutional: Vital signs reviewed.  Patient is a well-developed and well-nourished female in no acute distress and cooperative with exam.   Head: Normocephalic and atraumatic Eyes: PERRL, EOMI Neck: Supple. No JVD Cardiovascular: RRR, no MRG Pulmonary/Chest: normal respiratory effort, CTAB, no wheezes, rales, or rhonchi Abdominal: Soft. Non-tender, non-distended Musculoskeletal: 3+ pitting edema to BLE Neurological: A&O x3, nonfocal Skin: +serous drainage weeping from BLE distal to the knees Psychiatric: Normal mood and affect. speech and behavior is normal.   Assessment & Plan:   Please refer to Problem List based Assessment and Plan

## 2013-06-26 NOTE — Assessment & Plan Note (Addendum)
Pt with worsening BLE edema with weeping serous fluid from the legs. She endorses compliance with the increased Lasix dose of 40mg  BID, but is consuming additional salt with her meals, which she states that she adds to her food, which has likely precipitated her worsening edema since hospital d/c in addition to her nephrotic syndrome. She is not having any chest pain or increased SOB/DOE, her lungs are clear on exam, and she has no JVD. I do not his she needs to be admitted to the hospital at this point for IV diuresis, but she does need increased diuresis that I feel can be done at home with increasing her po Lasix to 80mg  qhs in addition to 40mg  q day. She is to obtain a scale and check her weight each morning. She was asked to call the clinic or go to the ED if her weight or BLE edema continues to increase, if she develops chest pain, has increased SOB, or has increased orthopnea.  - Lasix 40mg  qam and 80mg  qhs - Return to the clinic on Friday

## 2013-06-26 NOTE — Assessment & Plan Note (Signed)
A referral was placed for Nephrology today.

## 2013-06-27 ENCOUNTER — Other Ambulatory Visit: Payer: Medicaid Other

## 2013-06-27 ENCOUNTER — Telehealth: Payer: Self-pay | Admitting: *Deleted

## 2013-06-27 ENCOUNTER — Ambulatory Visit: Payer: Medicaid Other

## 2013-06-27 ENCOUNTER — Encounter: Payer: Self-pay | Admitting: Internal Medicine

## 2013-06-27 ENCOUNTER — Ambulatory Visit (HOSPITAL_BASED_OUTPATIENT_CLINIC_OR_DEPARTMENT_OTHER): Payer: Medicaid Other | Admitting: Internal Medicine

## 2013-06-27 VITALS — BP 146/69 | HR 78 | Temp 98.3°F | Resp 20 | Ht 65.0 in | Wt 207.9 lb

## 2013-06-27 DIAGNOSIS — N183 Chronic kidney disease, stage 3 unspecified: Secondary | ICD-10-CM

## 2013-06-27 DIAGNOSIS — B1920 Unspecified viral hepatitis C without hepatic coma: Secondary | ICD-10-CM

## 2013-06-27 DIAGNOSIS — D591 Autoimmune hemolytic anemia, unspecified: Secondary | ICD-10-CM

## 2013-06-27 DIAGNOSIS — R911 Solitary pulmonary nodule: Secondary | ICD-10-CM

## 2013-06-27 DIAGNOSIS — B20 Human immunodeficiency virus [HIV] disease: Secondary | ICD-10-CM

## 2013-06-27 DIAGNOSIS — N049 Nephrotic syndrome with unspecified morphologic changes: Secondary | ICD-10-CM

## 2013-06-27 NOTE — Progress Notes (Signed)
Checked in new pt with no financial concerns. °

## 2013-06-27 NOTE — Telephone Encounter (Signed)
appts made and printed...td 

## 2013-06-27 NOTE — Progress Notes (Signed)
Case discussed with Dr. Glenn at the time of the visit.  We reviewed the resident's history and exam and pertinent patient test results.  I agree with the assessment, diagnosis, and plan of care documented in the resident's note.   

## 2013-06-28 ENCOUNTER — Ambulatory Visit (INDEPENDENT_AMBULATORY_CARE_PROVIDER_SITE_OTHER): Payer: Medicaid Other | Admitting: Internal Medicine

## 2013-06-28 ENCOUNTER — Encounter: Payer: Self-pay | Admitting: Internal Medicine

## 2013-06-28 DIAGNOSIS — N049 Nephrotic syndrome with unspecified morphologic changes: Secondary | ICD-10-CM

## 2013-06-28 DIAGNOSIS — I5031 Acute diastolic (congestive) heart failure: Secondary | ICD-10-CM

## 2013-06-28 DIAGNOSIS — I5189 Other ill-defined heart diseases: Secondary | ICD-10-CM

## 2013-06-28 DIAGNOSIS — I519 Heart disease, unspecified: Secondary | ICD-10-CM

## 2013-06-28 LAB — BASIC METABOLIC PANEL WITH GFR
BUN: 68 mg/dL — ABNORMAL HIGH (ref 6–23)
CHLORIDE: 106 meq/L (ref 96–112)
CO2: 14 mEq/L — ABNORMAL LOW (ref 19–32)
CREATININE: 2 mg/dL — AB (ref 0.50–1.10)
Calcium: 7.9 mg/dL — ABNORMAL LOW (ref 8.4–10.5)
GFR, EST AFRICAN AMERICAN: 32 mL/min — AB
GFR, EST NON AFRICAN AMERICAN: 28 mL/min — AB
Glucose, Bld: 273 mg/dL — ABNORMAL HIGH (ref 70–99)
Potassium: 4 mEq/L (ref 3.5–5.3)
Sodium: 137 mEq/L (ref 135–145)

## 2013-06-28 MED ORDER — FUROSEMIDE 80 MG PO TABS: 80.0000 mg | ORAL_TABLET | Freq: Two times a day (BID) | ORAL | Status: DC

## 2013-06-28 NOTE — Patient Instructions (Signed)
Thank you for your visit today. Please return to the internal medicine clinic in 1 week or sooner if needed.    We will increase your lasix dose to 80mg  twice daily.  Please keep your feet elevated above the level of your heart.  Please be sure to bring all of your medications with you to every visit.  Should you have any new or worsening symptoms, please be sure to call the clinic at [phone].  If you believe that you are suffering from a life threatening condition or one that may result in the loss of limb or function, then you should call 911 or proceed to the nearest Emergency Department.

## 2013-06-28 NOTE — Progress Notes (Addendum)
Subjective:   Patient ID: Michelle [name] female    DOB: 11-03-1960 53 y.o.    MRN: [id number] Health Maintenance Due: Health Maintenance Due  Topic Date Due  . Tetanus/tdap  05/06/1978  . Mammogram  05/06/2009  . Colonoscopy  05/06/2009    ____________________________________________________________________  HPI: Michelle [name] is a 53 y.o. female here for a recheck of her lower extremity swelling.  She arrives 30 minutes late for her appointment.  Her cousin accompanies her and is requesting a refill for robaxin.  Upon calling the pharmacy, the pt has refills left on her robaxin and just picked up a refill recently.  Pt has a PMH outlined below.  Please see problem-based charting assessment and plan note for further details of medical issues addressed at today's visit.  PMH: Past Medical History  Diagnosis Date  . Seizures   . Stroke   . Meningitis   . HIV (human immunodeficiency virus infection)   . Hypertension   . Gout   . Muscle spasms of head and/or neck   . CKD (chronic kidney disease)   . CHF (congestive heart failure)     [name] 06/18/2013  . HCV (hepatitis C virus)     chronic/notes 06/18/2013  . Type II diabetes mellitus     [name] 06/18/2013  . AIHA (autoimmune hemolytic anemia)     [name] 06/18/2013  . Hypertensive encephalopathy ~ 05/2013    hospitalaized/notes 06/18/2013  . Daily headache     "for the last 6 years/notes 06/18/2013  . Exertional shortness of breath     [name] 06/18/2013  . Anxiety     [name] 06/18/2013  . Nephrotic syndrome   . History of syphilis     Medications: Current Outpatient Prescriptions on File Prior to Visit  Medication Sig Dispense Refill  . abacavir (ZIAGEN) 300 MG tablet Take 2 tablets (600 mg total) by mouth daily.  60 tablet  6  . acyclovir (ZOVIRAX) 800 MG tablet Take 1 tablet (800 mg total) by mouth daily.  30 tablet  3  . aspirin EC 81 MG tablet Take 1 tablet (81 mg total) by mouth daily.  30 tablet  11  . Blood Glucose  Monitoring Suppl (ACCU-CHEK AVIVA PLUS) W/DEVICE KIT USE AS DIRECTED  1 kit  0  . cholecalciferol (VITAMIN D) 1000 UNITS tablet Take 1,000 Units by mouth daily.      . cloNIDine (CATAPRES) 0.2 MG tablet Take 1 tablet (0.2 mg total) by mouth 2 (two) times daily.  60 tablet  3  . Darunavir Ethanolate (PREZISTA) 800 MG tablet Take 1 tablet (800 mg total) by mouth daily with breakfast.  30 tablet  6  . furosemide (LASIX) 40 MG tablet Take 1 tablet (40 mg total) by mouth daily.  60 tablet  3  . furosemide (LASIX) 80 MG tablet Take 1 tablet (80 mg total) by mouth at bedtime.      [name] glucose blood (ACCU-CHEK AVIVA) test strip Use as instructed  100 each  12  . hydrALAZINE (APRESOLINE) 50 MG tablet Take 50 mg by mouth 3 (three) times daily.      [name] HYDROcodone-acetaminophen (NORCO) 10-325 MG per tablet Take 1 tablet by mouth every 6 (six) hours as needed.      . insulin aspart (NOVOLOG) 100 UNIT/ML injection Inject into the skin 3 (three) times daily before meals. Home Sliding scale      . insulin glargine (LANTUS) 100 UNIT/ML injection Inject 30 Units into the skin  at bedtime.      [name] labetalol (NORMODYNE) 100 MG tablet Take 1 tablet (100 mg total) by mouth 2 (two) times daily.  60 tablet  3  . lamiVUDine (EPIVIR) 150 MG tablet Take 1 tablet (150 mg total) by mouth daily.  30 tablet  6  . levETIRAcetam (KEPPRA) 500 MG tablet Take 3 tablets (1,500 mg total) by mouth every 12 (twelve) hours.  180 tablet  3  . lisinopril (PRINIVIL,ZESTRIL) 40 MG tablet Take 1 tablet (40 mg total) by mouth daily.  30 tablet  3  . LORazepam (ATIVAN) 1 MG tablet Take 1 mg by mouth daily. Take one tablet for seizure prevention.      . methocarbamol (ROBAXIN) 500 MG tablet Take 1,000 mg by mouth every 6 (six) hours as needed for muscle spasms.      [name] oxyCODONE 10 MG TABS Take 1 tablet (10 mg total) by mouth every 6 (six) hours as needed for severe pain.  30 tablet  0  . pantoprazole (PROTONIX) 40 MG tablet Take 1 tablet (40 mg  total) by mouth daily.  30 tablet  11  . phenytoin (DILANTIN) 50 MG tablet Chew 2 tablets (100 mg total) by mouth 3 (three) times daily.  180 tablet  3  . predniSONE (DELTASONE) 20 MG tablet Take 2 tablets (40 mg total) by mouth daily with breakfast.  60 tablet  0  . ritonavir (NORVIR) 100 MG TABS tablet Take 1 tablet (100 mg total) by mouth daily with breakfast.  30 tablet  6  . triamcinolone cream (KENALOG) 0.1 % Apply 1 application topically 2 (two) times daily as needed. For rash.       No current facility-administered medications on file prior to visit.    Allergies: Allergies  Allergen Reactions  . 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 Norvasc [Amlodipine Besylate]     Itching, rash , hives .   [name] Morphine And Related Hives, Itching and Rash    FH: Family History  Problem Relation Age of Onset  . Cancer - Colon Mother   . Cancer Father   . Diabetes    . Hypertension Father     SH: History   Social History  . Marital Status: Single    Spouse Name: N/A    Number of Children: 4  . Years of Education: 11th   Social History Main Topics  . Smoking status: Former Smoker    Types: Cigarettes    Quit date: 06/19/2010  . Smokeless tobacco: Never Used  . Alcohol Use: No  . Drug Use: No     Comment: past history of alcohol, cocaine and IV drug use  . Sexual Activity: Not Currently    Partners: Male     Comment: given condoms   Other Topics Concern  . Not on file   Social History Narrative   Patient lives at home with sister.    Patient is unemployed.    Patient is single.    Patient has 2 alive and 2 deceased.    Patient has 11th grade education.           Review of Systems: Please see problem-based charting assessment and plan note for pertinent ROS.    Objective:   Vital Signs: There were no vitals filed for this visit.    BP Readings from Last 3 Encounters:  06/27/13 146/69  06/26/13 131/74  06/20/13  127/69  Physical Exam: Constitutional: Vital signs reviewed.  Patient is well-developed and well-nourished in NAD and cooperative with exam.  Head: Normocephalic and atraumatic. Eyes: PERRL, EOMI, conjunctivae nl, no scleral icterus.  Neck: Supple, Trachea midline, no JVD appreciated. Cardiovascular: RRR, no MRG, pulses symmetric and intact b/l. Pulmonary/Chest: normal effort, non-tender to palpation, CTAB, no wheezes, rales, or rhonchi. Abdominal: Obese. Soft. NT/ND +BS. Neurological: A&O x3, cranial nerves II-XII are grossly intact, moving all extremities. Extremities: 2-3+ LE pitting edema to knees bilaterally; she has "weeping" but no signs of infections; no wounds. Skin: Warm, dry and intact.   Most Recent Laboratory Results:  CMP     Component Value Date/Time   NA 137 06/28/2013 1443   K 4.0 06/28/2013 1443   CL 106 06/28/2013 1443   CO2 14* 06/28/2013 1443   GLUCOSE 273* 06/28/2013 1443   BUN 68* 06/28/2013 1443   CREATININE 2.00* 06/28/2013 1443   CREATININE 1.75* 06/20/2013 0515   CALCIUM 7.9* 06/28/2013 1443   PROT 5.3* 06/26/2013 1205   ALBUMIN 2.6* 06/26/2013 1205   AST 58* 06/26/2013 1205   ALT 101* 06/26/2013 1205   ALKPHOS 186* 06/26/2013 1205   BILITOT 0.6 06/26/2013 1205   GFRNONAA 32* 06/20/2013 0515   GFRAA 37* 06/20/2013 0515    CBC    Component Value Date/Time   WBC 7.3 06/26/2013 1205   RBC 2.58* 06/26/2013 1205   RBC 2.07* 05/31/2013 0935   HGB 8.8* 06/26/2013 1205   HCT 25.6* 06/26/2013 1205   PLT 156 06/26/2013 1205   MCV 99.2 06/26/2013 1205   MCH 34.1* 06/26/2013 1205   MCHC 34.4 06/26/2013 1205   RDW 18.8* 06/26/2013 1205   LYMPHSABS 1.6 06/18/2013 1210   MONOABS 0.8 06/18/2013 1210   EOSABS 0.0 06/18/2013 1210   BASOSABS 0.0 06/18/2013 1210    Lipid Panel Lab Results  Component Value Date   CHOL 185 05/07/2013   HDL 48 05/07/2013   LDLCALC 88 05/07/2013   TRIG 246* 05/07/2013   CHOLHDL 3.9 05/07/2013    HA1C Lab Results  Component Value Date   HGBA1C  5.0 05/14/2013    Urinalysis    Component Value Date/Time   COLORURINE YELLOW 06/20/2013 1224   APPEARANCEUR CLEAR 06/20/2013 1224   LABSPEC 1.017 06/20/2013 1224   PHURINE 6.0 06/20/2013 1224   GLUCOSEU NEGATIVE 06/20/2013 1224   HGBUR TRACE* 06/20/2013 1224   BILIRUBINUR NEGATIVE 06/20/2013 1224   KETONESUR NEGATIVE 06/20/2013 1224   PROTEINUR >300* 06/20/2013 1224   UROBILINOGEN 0.2 06/20/2013 1224   NITRITE NEGATIVE 06/20/2013 1224   LEUKOCYTESUR NEGATIVE 06/20/2013 1224    Urine Microalbumin Lab Results  Component Value Date   MICROALBUR 246.49* 06/20/2013    Imaging N/A   Assessment & Plan:   Assessment and plan was discussed and formulated with my attending.  Patient should return to the [facility] in 1 week for a recheck of bilateral LE edema and recheck BMP.

## 2013-06-28 NOTE — Progress Notes (Signed)
[place] [phone]   [phone]  NEW PATIENT EVALUATION   Name: Michelle [name] Date: 06/28/2013 MRN: [id number] DOB: 1961-04-03  PCP: Clinton Gallant, MD   REFERRING PHYSICIAN: Clinton Gallant, MD  REASON FOR REFERRAL: AIHA    HISTORY OF PRESENT ILLNESS:Michelle [name] is a 53 y.o. 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 She is accompanied by her cousin Roderic Ovens and a friend.  Of note, she has a past medical history of CHF (EF normal, with Grade 2 diastolic dysfunction), HIV (CD4 490 and VL <20 on 05/07/13) chronic HCV, seizure disorder, history of CVA, HTN, T2DM, CKD and Gout.  She was recently hospitalized on 01/20 and discharged on 1/22 for a CHF exacerbation.    She was aggresively diuresed with lasix and she home on lasix.  She reports increasing weight since discharge and now takes 80 mg of lasix in the morning and 40 mg at night.  She was also started on prednisone for the autoimmune hemolytic anemia, which was thought to have contributed to her fluid retention.  She will started 20 mg of prednisone (down from 40 mg of prednisone daily).  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  She is being referred to [place] for management of her AIHA.    PAST MEDICAL HISTORY:  has a past medical history of Seizures; Stroke; Meningitis; HIV (human immunodeficiency virus infection); Hypertension; Gout; Muscle spasms of head and/or neck; CKD (chronic kidney disease); CHF (congestive heart failure); HCV (hepatitis C virus); Type II diabetes mellitus; AIHA (autoimmune hemolytic anemia); Hypertensive encephalopathy (~ 05/2013); Daily headache; Exertional shortness of breath; Anxiety; Nephrotic syndrome; and History of syphilis.     PAST SURGICAL HISTORY: Past Surgical History  Procedure Laterality Date  . Hip  pinning Right    CURRENT MEDICATIONS: has a current medication list which includes the following prescription(s): abacavir, acyclovir, aspirin ec, accu-chek aviva plus, cholecalciferol, clonidine, darunavir ethanolate, furosemide, furosemide, glucose blood, hydralazine, hydrocodone-acetaminophen, insulin aspart, insulin glargine, labetalol, lamivudine, levetiracetam, lisinopril, lorazepam, methocarbamol, oxycodone hcl, pantoprazole, phenytoin, prednisone, ritonavir, and triamcinolone cream.   ALLERGIES: Ceftriaxone; Norvasc; and Morphine and related   SOCIAL HISTORY:  reports that she quit smoking about 3 years ago. Her smoking use included Cigarettes. She smoked 0.00 packs per day. She has never used smokeless tobacco. She reports that she does not drink alcohol or use illicit drugs.   FAMILY HISTORY: family history includes Cancer in her father; Cancer - Colon in her mother; Diabetes in an other family member; Hypertension in her father.   LABORATORY DATA:  Results for orders placed in visit on 06/26/13 (from the past 48 hour(s))  GLUCOSE, CAPILLARY     Status: Abnormal   Collection Time    06/26/13 10:57 AM      Result Value Range   Glucose-Capillary 127 (*) 70 - 99 mg/dL  CBC     Status: Abnormal   Collection Time    06/26/13 12:05 PM      Result Value Range   WBC 7.3  4.0 - 10.5 K/uL   RBC 2.58 (*) 3.87 - 5.11 MIL/uL   Hemoglobin 8.8 (*) 12.0 - 15.0 g/dL   HCT 25.6 (*) 36.0 - 46.0 %   MCV 99.2  78.0 - 100.0 fL   MCH 34.1 (*) 26.0 - 34.0 pg  MCHC 34.4  30.0 - 36.0 g/dL   RDW 18.8 (*) 11.5 - 15.5 %   Platelets 156  150 - 400 K/uL  COMPLETE METABOLIC PANEL WITH GFR     Status: Abnormal   Collection Time    06/26/13 12:05 PM      Result Value Range   Sodium 136  135 - 145 mEq/L   Potassium 4.1  3.5 - 5.3 mEq/L   Chloride 109  96 - 112 mEq/L   CO2 17 (*) 19 - 32 mEq/L   Glucose, Bld 125 (*) 70 - 99 mg/dL   BUN 71 (*) 6 - 23 mg/dL   Creat 2.13 (*) 0.50 - 1.10 mg/dL    Total Bilirubin 0.6  0.2 - 1.2 mg/dL   Comment: ** Please note change in reference range(s). **   Alkaline Phosphatase 186 (*) 39 - 117 U/L   AST 58 (*) 0 - 37 U/L   ALT 101 (*) 0 - 35 U/L   Total Protein 5.3 (*) 6.0 - 8.3 g/dL   Albumin 2.6 (*) 3.5 - 5.2 g/dL   Calcium 7.6 (*) 8.4 - 10.5 mg/dL   GFR, Est African American 30 (*)    GFR, Est Non African American 26 (*)    Comment:       The estimated GFR is a calculation valid for adults (>=3 years old)     that uses the CKD-EPI algorithm to adjust for age and sex. It is       not to be used for children, pregnant women, hospitalized patients,        patients on dialysis, or with rapidly changing kidney function.     According to the NKDEP, eGFR >89 is normal, 60-89 shows mild     impairment, 30-59 shows moderate impairment, 15-29 shows severe     impairment and <15 is ESRD.             RADIOGRAPHY: X-[place] Chest Pa And Lateral   06/18/2013   ADDENDUM REPORT: 06/18/2013 22:32  ADDENDUM: There is a questionable 1 cm nodular density along the anterior lower chest on the lateral view. This could be contributing to the nodular density on the frontal view but may not account for the entire density. A chest CT would be most definitive to exclude a pulmonary nodule.   Electronically Signed   By: Markus Daft M.D.   On: 06/18/2013 22:32   06/18/2013   CLINICAL DATA:  Chest pain and shortness of breath.  EXAM: CHEST  2 VIEW  COMPARISON:  05/10/2013  FINDINGS: Two views of the chest were obtained. This is a 2.1 cm nodular structure in the retrocardiac space on the frontal view. However, a nodule is not clearly identified on the lateral view. This could represent overlying shadows. Heart size is normal. Lungs are clear without airspace disease or edema. There is probably mild basilar atelectasis. No evidence for pleural effusions.  IMPRESSION: No acute chest findings.  Indeterminate 2.1 cm nodular structure in the retrocardiac region on the frontal view.  However, there is not a correlating lesion on the lateral view and this may represent overlying structures. Recommend a follow-up two view chest radiograph to confirm that this represents overlying structures rather than a true nodule.  Electronically Signed: By: Markus Daft M.D. On: 06/18/2013 22:07   Ct Chest Wo Contrast  06/19/2013   CLINICAL DATA:  Pulmonary nodule.  EXAM: CT CHEST WITHOUT CONTRAST  TECHNIQUE: Multidetector CT imaging of the chest  was performed following the standard protocol without IV contrast.  COMPARISON:  Chest x-[facility] dated 06/18/2013  FINDINGS: There is a 25 x 17 x 18 mm poorly defined cavitary lesion in the left lower lobe with peripheral linear atelectasis extending from the area of the lesion. There is no hilar or mediastinal adenopathy. There is slight linear atelectasis at the right lung base. Heart size is normal. There appears to be a coronary artery stent in place.  No osseous abnormality.  The visualized portion of the upper abdomen is normal.  IMPRESSION: 2.5 cm cavitary poorly defined nodular density in the left lower lobe. I suspect this represents atypical infection in this patient with HIV disease.  Malignancy is felt to be less likely.   Electronically Signed   By: Rozetta Nunnery M.D.   On: 06/19/2013 16:34   US Abdomen Complete  06/19/2013   CLINICAL DATA:  Abdominal pain, elevated LFTs  EXAM: ULTRASOUND ABDOMEN COMPLETE  COMPARISON:  05/28/2003  FINDINGS: Gallbladder:  No gallstones, gallbladder wall thickening, or pericholecystic fluid. Negative sonographic Murphy's sign.  Common bile duct:  Diameter: 4 mm.  Liver:  Mildly nodular hepatic contour (for example, image 19). No focal hepatic lesion is seen.  IVC:  No abnormality visualized.  Pancreas:  Visualized portions are unremarkable.  Spleen:  Measures 6.2 cm.  Right Kidney:  Length: 12.3 cm.  No mass or hydronephrosis.  Left Kidney:  Length: 11.6 cm.  No mass or hydronephrosis.  Abdominal aorta:  No aneurysm  visualized.  Other findings:  None.  IMPRESSION: Mildly nodular hepatic contour, raising the possibility of cirrhosis.  Otherwise, negative abdominal ultrasound.   Electronically Signed   By: Julian Hy M.D.   On: 06/19/2013 16:08     REVIEW OF SYSTEMS:  Constitutional: Denies fevers, chills or abnormal weight loss Eyes: Denies blurriness of vision Ears, nose, mouth, throat, and face: Denies mucositis or sore throat Respiratory: Denies cough, reports dyspnea with exertion on and off, denies  wheezes Cardiovascular: Denies palpitation, chest discomfort but does report lower extremity swelling that is occasionally weaping Gastrointestinal:  Denies nausea, heartburn or change in bowel habits Skin: Denies abnormal skin rashes Lymphatics: Denies new lymphadenopathy or easy bruising Neurological:Denies numbness, tingling or new weaknesses Behavioral/Psych: Mood is stable, no new changes  All other systems were reviewed with the patient and are negative.  PHYSICAL EXAM:  height is 5' 5"  (1.651 m) and weight is 207 lb 14.4 oz (94.303 kg). Her oral temperature is 98.3 F (36.8 C). Her blood pressure is 146/69 and her pulse is 78. Her respiration is 20.    GENERAL:alert, no distress and comfortable; chronically ill appearing  SKIN: skin color, texture, turgor are normal, no rashes or significant lesions; Puffiness in face EYES: normal, Conjunctiva are pink and non-injected, sclera clear OROPHARYNX:no exudate, no erythema and lips, buccal mucosa, and tongue normal  NECK: supple, thyroid normal size, non-tender, without nodularity LYMPH:  no palpable lymphadenopathy in the cervical, axillary or inguinal LUNGS: clear to auscultation and percussion with normal breathing effort HEART: regular rate & rhythm and no murmurs and 3+ weaping lower extremity edema ABDOMEN:abdomen soft, non-tender and normal bowel sounds Musculoskeletal:no cyanosis of digits and no clubbing  NEURO: alert & oriented x 3  with fluent speech, drowsy   IMPRESSION: Michelle [name] is a 53 y.o. female with a history of    PLAN:  1. AIHA.  --Thought to be medication induced secondary to ceftriaxone, diagnosed during last admission. Hgb stable at baseline  8-9. We continued her prednisone taper at 40 mg daily today with plans to decrease to 20 mg daily on 1/28 for 1 month, then re-evaluate with CBC, haptoglobin, LDH in the clinic.  We will repeat CBC and hemolysis markers in 2 weeks and upon return visit in one month.  Her hemoglobin is 8.8.  --If remission persists next visit, further reduce the dosing to 20 mg/day alternating with 10 mg/day. Maintain this regimen for one month. If remission persists, omit the dose on alternate days while maintaining the dose at 20 mg every other day.If remission persists, reduce the dose to 10 mg/day on alternate days. Tapering of glucocorticoids should be continued as long as the hemoglobin and haptoglobin levels remain improved and stable, lactate dehydrogenase levels stay low, and the absolute reticulocyte count remains below 100,000/microL.  --Glucocorticoids can be stopped when there is normalization of the hemoglobin, haptoglobin, LDH, and absolute reticulocyte count, although the Coombs test may remain positive. The patient should be monitored for recurrence for a number of months following cessation of treatment.If, at any time during this dose tapering process, the remission is not maintained, other measures (eg, addition of cytotoxic therapy, splenectomy, rituxan)  must be instituted. She counseled extensively on symptoms of anemia including fatigue, worsening dyspnea on exertion.   All questions were answered. The patient knows to call the clinic with any problems, questions or concerns. We can certainly see the patient much sooner if necessary.  I spent 25 minutes counseling the patient face to face. The total time spent in the appointment was 45 minutes.    Burney Calzadilla,  MD 06/28/2013 5:31 AM

## 2013-07-01 ENCOUNTER — Telehealth: Payer: Self-pay | Admitting: *Deleted

## 2013-07-01 ENCOUNTER — Ambulatory Visit (INDEPENDENT_AMBULATORY_CARE_PROVIDER_SITE_OTHER): Payer: Medicaid Other | Admitting: Internal Medicine

## 2013-07-01 ENCOUNTER — Other Ambulatory Visit (HOSPITAL_COMMUNITY)
Admission: RE | Admit: 2013-07-01 | Discharge: 2013-07-01 | Disposition: A | Discharge status: Home / Self Care | Payer: Medicaid Other | Source: Ambulatory Visit | Attending: Internal Medicine | Admitting: Internal Medicine

## 2013-07-01 ENCOUNTER — Encounter: Payer: Self-pay | Admitting: Internal Medicine

## 2013-07-01 ENCOUNTER — Other Ambulatory Visit: Payer: Self-pay | Admitting: Internal Medicine

## 2013-07-01 VITALS — BP 122/67 | HR 96 | Temp 98.6°F | Ht 66.0 in | Wt 208.4 lb

## 2013-07-01 DIAGNOSIS — H5789 Other specified disorders of eye and adnexa: Secondary | ICD-10-CM

## 2013-07-01 DIAGNOSIS — Z113 Encounter for screening for infections with a predominantly sexual mode of transmission: Secondary | ICD-10-CM | POA: Insufficient documentation

## 2013-07-01 LAB — QUANTIFERON TB GOLD ASSAY (BLOOD)
Mitogen value: 0.14 IU/mL
Quantiferon Nil Value: 0.03 IU/mL
Quantiferon Tb Ag Minus Nil Value: 0 IU/mL
TB Ag value: 0.03 IU/mL

## 2013-07-01 NOTE — Patient Instructions (Signed)
1. Will send you for eye doctor. exam at 800 am 2. Follow up with the clinic as scheduled.

## 2013-07-01 NOTE — Progress Notes (Signed)
Patient ID: Michelle [name], female   DOB: 08-Jul-1960, 53 y.o.   MRN: [id number]    Patient: Michelle [name]   MRN: [id number]  DOB: [birth year]  PCP: Clinton Gallant, MD   Subjective:    CC: Follow-up, Epistaxis and Conjunctivitis   HPI: Michelle [name] is a 53 y.o. female with a PMHx of HIV ( last CD4 21 and VL < 44 in Dec 2014), HCV, hx of syphilis, self reported previous GC/Chlamydia infection, CVA on ASA, Seizure, HTN, CHF, CKD, Nephrotic syndrome , who presented to clinic today for the following:  1) red eyes with itching and drainage Patient states that she has had intermittent waxing and waning bilateral red eyes, itching feeling, whitish eye drainage with morning crusting, some ocular pain with associated headache for at least two months. Associated symptoms include intermittent sneezing, rhinorrhea, headache, and occasional nosebleed lasting less than 1 minute. She also reports blurry vision for at least one year and states that she needs an new glasses but has not seen a ophthalmologist. She admits seasonal allergies problems.   She reports that she has had gradual onset slightly worsening of bilateral eyes redness, whitish drainage with morning crusting, mild ocular pain left more than right for past 3 days. She states that her blurry vision is at baseline. Admits sneezing and nasal congestion. Denies sick contact. Denies fever, chills, facial rash or vesicles, genital drainage, diplopia, photophobia, nausea, vomiting. Denies high risk sexual behavior and states no sexual relationship for over one year. She is here for the evaluation.         Review of Systems: Per HPI.   Current Outpatient Medications: Current Outpatient Prescriptions  Medication Sig Dispense Refill  . abacavir (ZIAGEN) 300 MG tablet Take 2 tablets (600 mg total) by mouth daily.  60 tablet  6  . acyclovir (ZOVIRAX) 800 MG tablet Take 1 tablet (800 mg total) by mouth daily.  30 tablet  3  . aspirin EC 81 MG tablet Take 1  tablet (81 mg total) by mouth daily.  30 tablet  11  . Blood Glucose Monitoring Suppl (ACCU-CHEK AVIVA PLUS) W/DEVICE KIT USE AS DIRECTED  1 kit  0  . cholecalciferol (VITAMIN D) 1000 UNITS tablet Take 1,000 Units by mouth daily.      . cloNIDine (CATAPRES) 0.2 MG tablet Take 1 tablet (0.2 mg total) by mouth 2 (two) times daily.  60 tablet  3  . Darunavir Ethanolate (PREZISTA) 800 MG tablet Take 1 tablet (800 mg total) by mouth daily with breakfast.  30 tablet  6  . furosemide (LASIX) 40 MG tablet Take 1 tablet (40 mg total) by mouth daily.  60 tablet  3  . furosemide (LASIX) 80 MG tablet Take 1 tablet (80 mg total) by mouth 2 (two) times daily.  30 tablet  0  . glucose blood (ACCU-CHEK AVIVA) test strip Use as instructed  100 each  12  . hydrALAZINE (APRESOLINE) 50 MG tablet Take 50 mg by mouth 3 (three) times daily.      [name] HYDROcodone-acetaminophen (NORCO) 10-325 MG per tablet Take 1 tablet by mouth every 6 (six) hours as needed.      . insulin aspart (NOVOLOG) 100 UNIT/ML injection Inject into the skin 3 (three) times daily before meals. Home Sliding scale      . insulin glargine (LANTUS) 100 UNIT/ML injection Inject 30 Units into the skin at bedtime.      [name] labetalol (NORMODYNE) 100 MG tablet Take 1  tablet (100 mg total) by mouth 2 (two) times daily.  60 tablet  3  . lamiVUDine (EPIVIR) 150 MG tablet Take 1 tablet (150 mg total) by mouth daily.  30 tablet  6  . levETIRAcetam (KEPPRA) 500 MG tablet Take 3 tablets (1,500 mg total) by mouth every 12 (twelve) hours.  180 tablet  3  . lisinopril (PRINIVIL,ZESTRIL) 40 MG tablet Take 1 tablet (40 mg total) by mouth daily.  30 tablet  3  . LORazepam (ATIVAN) 1 MG tablet Take 1 mg by mouth daily. Take one tablet for seizure prevention.      . methocarbamol (ROBAXIN) 500 MG tablet Take 1,000 mg by mouth every 6 (six) hours as needed for muscle spasms.      [name] oxyCODONE 10 MG TABS Take 1 tablet (10 mg total) by mouth every 6 (six) hours as needed for  severe pain.  30 tablet  0  . pantoprazole (PROTONIX) 40 MG tablet Take 1 tablet (40 mg total) by mouth daily.  30 tablet  11  . phenytoin (DILANTIN) 50 MG tablet Chew 2 tablets (100 mg total) by mouth 3 (three) times daily.  180 tablet  3  . predniSONE (DELTASONE) 20 MG tablet Take 2 tablets (40 mg total) by mouth daily with breakfast.  60 tablet  0  . ritonavir (NORVIR) 100 MG TABS tablet Take 1 tablet (100 mg total) by mouth daily with breakfast.  30 tablet  6  . triamcinolone cream (KENALOG) 0.1 % Apply 1 application topically 2 (two) times daily as needed. For rash.       No current facility-administered medications for this visit.    Allergies: Allergies  Allergen Reactions  . 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 Norvasc [Amlodipine Besylate]     Itching, rash , hives .   [name] Morphine And Related Hives, Itching and Rash    Past Medical History  Diagnosis Date  . Seizures   . Stroke   . Meningitis   . HIV (human immunodeficiency virus infection)   . Hypertension   . Gout   . Muscle spasms of head and/or neck   . CKD (chronic kidney disease)   . CHF (congestive heart failure)     [name] 06/18/2013  . HCV (hepatitis C virus)     chronic/notes 06/18/2013  . Type II diabetes mellitus     [name] 06/18/2013  . AIHA (autoimmune hemolytic anemia)     [name] 06/18/2013  . Hypertensive encephalopathy ~ 05/2013    hospitalaized/notes 06/18/2013  . Daily headache     "for the last 6 years/notes 06/18/2013  . Exertional shortness of breath     [name] 06/18/2013  . Anxiety     [name] 06/18/2013  . Nephrotic syndrome   . History of syphilis     Objective:    Physical Exam: Filed Vitals:   07/01/13 1524  BP: 122/67  Pulse: 96  Temp: 98.6 F (37 C)  TempSrc: Oral  Height: _0  (1.676 m)  Weight: 208 lb 6.4 oz (94.53 kg)  SpO2: 100%    General: Vital signs reviewed and noted. Well-developed, well-nourished, in no acute  distress; alert, appropriate and cooperative throughout examination.  Head: Normocephalic, atraumatic. No preauricular lymphadenopathy  Eyes: Bilateral eyes moderately injected. Very mild whitish/mucoid discharge noted at bilateral medial canthus. No purulent discharge noted. no circumcorneal erythema or corneal irregularity or edema noted.  EOM intact.no facial rash or vesicle noted.  Visual fields grossly normal, pupils equal, round, reactive to light and accommodation   Lungs:  Normal respiratory effort. Clear to auscultation BL without crackles or wheezes.  Heart: RRR. S1 and S2 normal without gallop, rubs, murmur.  Abdomen:  BS normoactive. Soft, Nondistended, non-tender.  No masses or organomegaly.  Extremities: No pretibial edema.   Assessment/ Plan:

## 2013-07-01 NOTE — Telephone Encounter (Signed)
Care giver for pt called - now both eye are red, bright red blood from nose with blowing and both legs are swollen. Has increase Lasix. Caregiver states legs need to be wrapped. Needs order for home care and refill on generic Robaxin. Abd appears to be swollen.  Appt made today Dr Wray Kearns Charnell Peplinski RN 07/01/13 9:30AM

## 2013-07-01 NOTE — Assessment & Plan Note (Addendum)
Assessment Patient with a hx of HIV ( last CD4 490 and VL < 20 in Dec 2014), HCV, hx of syphilis, self reported previous GC/Chlamydia infection, presented with 3-day duration of worsening bilateral red eyes, itching, mild ocular pain in the setting of chronic blurry vision and intermittent 64-month duration of similar symptoms. Physical examination revealed bilateral moderately injected eyes with mild whitish mucoid discharges.   The clinical manifestation suggestive of possible viral conjunctivitis in the setting of ? Allergic conjunctivitis. She has chronic blurry vision, and the etiology is unclear. I think that we should r/o open angle glaucoma.   Given her history of HIV, syphilis, revious GC/Chlamydia infection ( she is unsure about herpes infection), we will need to r/o infectious conjunctivitis. GC conjunctivitis is less likely since it usually presented with acute onset, hyperpurulent eye discharge in a sexually active adult. Patient's conjunctivitis is gradual onset, mild mucoid drainage and she has had sexual relationship for at least one year.  GC conjunctivitis is possible since it associates with itching and watery discharge. However, it usually presents with preauricular lymphoadenopathy, which is not present on exam. Bacterial conjunctivitis usually started with unilateral then bilateral purulent discharge without pain or visual disturbance, which is not consistent with her clinical manifestation.  Herpes conjunctivitis is unlikely since she has no document hx of herpes, no rash or vesicles noted on exam, and her HIV is under control.  Iridocyclitis or keratitis is less likely since patient does not have photophobia, corneal irregularity or edema noted on exam.  Acute angle-closure glaucoma usually presents with unilateral deep ocular pain, nausea, vomiting, fixed nonreactive pupil and shallow anterior chamber, which is not consistent with patient's presentation.   Plan: - will send her  urgent ophthalmology consult. We are able to get her an appt at 0800 am tomorrow on 07/02/13.  - will collect GC/Chlamydia eye sample.  - instruct patient to call if she experience worsening of eye pain and visual disturbance.  - instruct patient to make to arrive at the appt in am.  Patient and family understands and agrees with the plan.

## 2013-07-01 NOTE — Assessment & Plan Note (Addendum)
Pt comes in for a recheck of bilateral LE "weeping" and pitting edema.   -recheck BMP stat (creatinine OK) -increase lasix to 80mg  bid -elevate LE above the level of the heart -f/u in 1 week

## 2013-07-02 ENCOUNTER — Ambulatory Visit: Payer: Medicaid Other | Admitting: Neurology

## 2013-07-02 ENCOUNTER — Encounter (HOSPITAL_COMMUNITY): Payer: Self-pay | Admitting: Emergency Medicine

## 2013-07-02 ENCOUNTER — Emergency Department (HOSPITAL_COMMUNITY)
Admission: EM | Admit: 2013-07-02 | Discharge: 2013-07-02 | Disposition: A | Discharge status: Home / Self Care | Payer: Medicaid Other | Attending: Emergency Medicine | Admitting: Emergency Medicine

## 2013-07-02 ENCOUNTER — Emergency Department (HOSPITAL_COMMUNITY): Payer: Medicaid Other

## 2013-07-02 DIAGNOSIS — I129 Hypertensive chronic kidney disease with stage 1 through stage 4 chronic kidney disease, or unspecified chronic kidney disease: Secondary | ICD-10-CM | POA: Insufficient documentation

## 2013-07-02 DIAGNOSIS — Z87891 Personal history of nicotine dependence: Secondary | ICD-10-CM | POA: Insufficient documentation

## 2013-07-02 DIAGNOSIS — M109 Gout, unspecified: Secondary | ICD-10-CM | POA: Insufficient documentation

## 2013-07-02 DIAGNOSIS — E119 Type 2 diabetes mellitus without complications: Secondary | ICD-10-CM | POA: Insufficient documentation

## 2013-07-02 DIAGNOSIS — Z862 Personal history of diseases of the blood and blood-forming organs and certain disorders involving the immune mechanism: Secondary | ICD-10-CM | POA: Insufficient documentation

## 2013-07-02 DIAGNOSIS — Z7982 Long term (current) use of aspirin: Secondary | ICD-10-CM | POA: Insufficient documentation

## 2013-07-02 DIAGNOSIS — M7989 Other specified soft tissue disorders: Secondary | ICD-10-CM | POA: Insufficient documentation

## 2013-07-02 DIAGNOSIS — Z21 Asymptomatic human immunodeficiency virus [HIV] infection status: Secondary | ICD-10-CM | POA: Insufficient documentation

## 2013-07-02 DIAGNOSIS — F411 Generalized anxiety disorder: Secondary | ICD-10-CM | POA: Insufficient documentation

## 2013-07-02 DIAGNOSIS — N189 Chronic kidney disease, unspecified: Secondary | ICD-10-CM | POA: Insufficient documentation

## 2013-07-02 DIAGNOSIS — Z8619 Personal history of other infectious and parasitic diseases: Secondary | ICD-10-CM | POA: Insufficient documentation

## 2013-07-02 DIAGNOSIS — Z79899 Other long term (current) drug therapy: Secondary | ICD-10-CM | POA: Insufficient documentation

## 2013-07-02 DIAGNOSIS — G40909 Epilepsy, unspecified, not intractable, without status epilepticus: Secondary | ICD-10-CM | POA: Insufficient documentation

## 2013-07-02 DIAGNOSIS — I509 Heart failure, unspecified: Secondary | ICD-10-CM | POA: Insufficient documentation

## 2013-07-02 DIAGNOSIS — Z794 Long term (current) use of insulin: Secondary | ICD-10-CM | POA: Insufficient documentation

## 2013-07-02 DIAGNOSIS — Z8673 Personal history of transient ischemic attack (TIA), and cerebral infarction without residual deficits: Secondary | ICD-10-CM | POA: Insufficient documentation

## 2013-07-02 LAB — CBC WITH DIFFERENTIAL/PLATELET
BASOS ABS: 0 10*3/uL (ref 0.0–0.1)
Basophils Relative: 0 % (ref 0–1)
EOS PCT: 0 % (ref 0–5)
Eosinophils Absolute: 0 10*3/uL (ref 0.0–0.7)
HEMATOCRIT: 25.2 % — AB (ref 36.0–46.0)
HEMOGLOBIN: 8.6 g/dL — AB (ref 12.0–15.0)
Lymphocytes Relative: 11 % — ABNORMAL LOW (ref 12–46)
Lymphs Abs: 0.7 10*3/uL (ref 0.7–4.0)
MCH: 34.5 pg — ABNORMAL HIGH (ref 26.0–34.0)
MCHC: 34.1 g/dL (ref 30.0–36.0)
MCV: 101.2 fL — AB (ref 78.0–100.0)
MONO ABS: 0.7 10*3/uL (ref 0.1–1.0)
MONOS PCT: 11 % (ref 3–12)
NEUTROS ABS: 5.1 10*3/uL (ref 1.7–7.7)
Neutrophils Relative %: 78 % — ABNORMAL HIGH (ref 43–77)
Platelets: 204 10*3/uL (ref 150–400)
RBC: 2.49 MIL/uL — ABNORMAL LOW (ref 3.87–5.11)
RDW: 17 % — AB (ref 11.5–15.5)
WBC: 6.6 10*3/uL (ref 4.0–10.5)

## 2013-07-02 LAB — BASIC METABOLIC PANEL
BUN: 62 mg/dL — AB (ref 6–23)
CALCIUM: 8.1 mg/dL — AB (ref 8.4–10.5)
CHLORIDE: 104 meq/L (ref 96–112)
CO2: 16 meq/L — AB (ref 19–32)
CREATININE: 2.1 mg/dL — AB (ref 0.50–1.10)
GFR calc Af Amer: 30 mL/min — ABNORMAL LOW (ref >90)
GFR calc non Af Amer: 26 mL/min — ABNORMAL LOW (ref >90)
Glucose, Bld: 188 mg/dL — ABNORMAL HIGH (ref 70–99)
Potassium: 4 mEq/L (ref 3.7–5.3)
Sodium: 137 mEq/L (ref 137–147)

## 2013-07-02 LAB — URINALYSIS, ROUTINE W REFLEX MICROSCOPIC
Bilirubin Urine: NEGATIVE
Glucose, UA: NEGATIVE mg/dL
KETONES UR: NEGATIVE mg/dL
LEUKOCYTES UA: NEGATIVE
NITRITE: NEGATIVE
PROTEIN: 100 mg/dL — AB
Specific Gravity, Urine: 1.011 (ref 1.005–1.030)
UROBILINOGEN UA: 0.2 mg/dL (ref 0.0–1.0)
pH: 5.5 (ref 5.0–8.0)

## 2013-07-02 LAB — PRO B NATRIURETIC PEPTIDE: PRO B NATRI PEPTIDE: 294.3 pg/mL — AB (ref 0–125)

## 2013-07-02 LAB — URINE MICROSCOPIC-ADD ON

## 2013-07-02 LAB — GLUCOSE, CAPILLARY: GLUCOSE-CAPILLARY: 273 mg/dL — AB (ref 70–99)

## 2013-07-02 MED ORDER — OXYCODONE-ACETAMINOPHEN 5-325 MG PO TABS
2.0000 | ORAL_TABLET | Freq: Once | ORAL | Status: AC
  Administered 2013-07-02: 2 via ORAL
  Filled 2013-07-02: qty 2

## 2013-07-02 MED ORDER — OXYCODONE HCL 10 MG PO TABS: 10.0000 mg | ORAL_TABLET | Freq: Four times a day (QID) | ORAL | Status: DC | PRN

## 2013-07-02 MED ORDER — LABETALOL HCL 5 MG/ML IV SOLN
10.0000 mg | Freq: Once | INTRAVENOUS | Status: AC
  Administered 2013-07-02: 10 mg via INTRAVENOUS
  Filled 2013-07-02: qty 4

## 2013-07-02 MED ORDER — LABETALOL HCL 5 MG/ML IV SOLN
20.0000 mg | Freq: Once | INTRAVENOUS | Status: AC
  Administered 2013-07-02: 20 mg via INTRAVENOUS
  Filled 2013-07-02: qty 4

## 2013-07-02 NOTE — ED Provider Notes (Signed)
CSN: [id number]     Arrival date & time 07/02/13  1303 History   First MD Initiated Contact with Patient 07/02/13 1341     Chief Complaint  Patient presents with  . Leg Swelling   (Consider location/radiation/quality/duration/timing/severity/associated sxs/prior Treatment) HPI Comments: Patient is a 53 year old female with a past medical history of CKD, CHF, diabetes, HIV, hypertension and seizures who presents with bilateral lower extremity edema. Symptoms started gradually and progressively worsened today. The pain is aching and severe without radiation. Patient reports she was just at her PCP yesterday morning and had increase in pain when she left. Patient did not complain of leg pain and swelling at her PCP visit as stated on the note. Palpation of the legs makes the pain worse. No alleviating factors. No other associated symptoms. Patient denies chest pain, SOB, cough.    Past Medical History  Diagnosis Date  . Seizures   . Stroke   . Meningitis   . HIV (human immunodeficiency virus infection)   . Hypertension   . Gout   . Muscle spasms of head and/or neck   . CKD (chronic kidney disease)   . CHF (congestive heart failure)     [name] 06/18/2013  . HCV (hepatitis C virus)     chronic/notes 06/18/2013  . Type II diabetes mellitus     [name] 06/18/2013  . AIHA (autoimmune hemolytic anemia)     [name] 06/18/2013  . Hypertensive encephalopathy ~ 05/2013    hospitalaized/notes 06/18/2013  . Daily headache     "for the last 6 years/notes 06/18/2013  . Exertional shortness of breath     [name] 06/18/2013  . Anxiety     [name] 06/18/2013  . Nephrotic syndrome   . History of syphilis    Past Surgical History  Procedure Laterality Date  . Hip pinning Right    Family History  Problem Relation Age of Onset  . Cancer - Colon Mother   . Cancer Father   . Diabetes    . Hypertension Father    History  Substance Use Topics  . Smoking status: Former Smoker    Types: Cigarettes    Quit  date: 06/19/2010  . Smokeless tobacco: Never Used  . Alcohol Use: No   OB History   Grav Para Term Preterm Abortions TAB SAB Ect Mult Living                 Review of Systems  Constitutional: Negative for fever, chills and fatigue.  HENT: Negative for trouble swallowing.   Eyes: Negative for visual disturbance.  Respiratory: Negative for shortness of breath.   Cardiovascular: Positive for leg swelling. Negative for chest pain and palpitations.  Gastrointestinal: Negative for nausea, vomiting, abdominal pain and diarrhea.  Genitourinary: Negative for dysuria and difficulty urinating.  Musculoskeletal: Negative for arthralgias and neck pain.  Skin: Negative for color change.  Neurological: Negative for dizziness and weakness.  Psychiatric/Behavioral: Negative for dysphoric mood.    Allergies  Ceftriaxone; Norvasc; and Morphine and related  Home Medications   Current Outpatient Rx  Name  Route  Sig  Dispense  Refill  . abacavir (ZIAGEN) 300 MG tablet   Oral   Take 2 tablets (600 mg total) by mouth daily.   60 tablet   6   . acyclovir (ZOVIRAX) 800 MG tablet   Oral   Take 1 tablet (800 mg total) by mouth daily.   30 tablet   3   . aspirin EC 81 MG  tablet   Oral   Take 1 tablet (81 mg total) by mouth daily.   30 tablet   11   . cholecalciferol (VITAMIN D) 1000 UNITS tablet   Oral   Take 1,000 Units by mouth daily.         . cloNIDine (CATAPRES) 0.2 MG tablet   Oral   Take 1 tablet (0.2 mg total) by mouth 2 (two) times daily.   60 tablet   3   . Darunavir Ethanolate (PREZISTA) 800 MG tablet   Oral   Take 1 tablet (800 mg total) by mouth daily with breakfast.   30 tablet   6   . furosemide (LASIX) 40 MG tablet   Oral   Take 40 mg by mouth 2 (two) times daily.         [name] glucose blood (ACCU-CHEK AVIVA) test strip      Use as instructed   100 each   12   . hydrALAZINE (APRESOLINE) 50 MG tablet   Oral   Take 50 mg by mouth 3 (three) times daily.          [name] labetalol (NORMODYNE) 100 MG tablet   Oral   Take 1 tablet (100 mg total) by mouth 2 (two) times daily.   60 tablet   3   . lamiVUDine (EPIVIR) 150 MG tablet   Oral   Take 1 tablet (150 mg total) by mouth daily.   30 tablet   6   . levETIRAcetam (KEPPRA) 500 MG tablet   Oral   Take 3 tablets (1,500 mg total) by mouth every 12 (twelve) hours.   180 tablet   3   . lisinopril (PRINIVIL,ZESTRIL) 40 MG tablet   Oral   Take 1 tablet (40 mg total) by mouth daily.   30 tablet   3   . LORazepam (ATIVAN) 1 MG tablet   Oral   Take 1 mg by mouth daily. Take one tablet for seizure prevention.         . Oxycodone HCl 10 MG TABS   Oral   Take 10 mg by mouth every 6 (six) hours as needed (severe pain).         . pantoprazole (PROTONIX) 40 MG tablet   Oral   Take 1 tablet (40 mg total) by mouth daily.   30 tablet   11   . phenytoin (DILANTIN) 50 MG tablet   Oral   Chew 2 tablets (100 mg total) by mouth 3 (three) times daily.   180 tablet   3   . ritonavir (NORVIR) 100 MG TABS tablet   Oral   Take 100 mg by mouth daily with breakfast.         . Blood Glucose Monitoring Suppl (ACCU-CHEK AVIVA PLUS) W/DEVICE KIT      USE AS DIRECTED   1 kit   0   . insulin aspart (NOVOLOG) 100 UNIT/ML injection   Subcutaneous   Inject into the skin 3 (three) times daily before meals. Home Sliding scale         . insulin glargine (LANTUS) 100 UNIT/ML injection   Subcutaneous   Inject 30 Units into the skin at bedtime.          BP 162/71  Temp(Src) 97.8 F (36.6 C) (Oral)  Resp 29  Ht 5' 5"  (1.651 m)  Wt 203 lb (92.08 kg)  BMI 33.78 kg/m2  SpO2 97% Physical Exam  Nursing note and vitals reviewed. Constitutional: She is  oriented to person, place, and time. She appears well-developed and well-nourished. No distress.  HENT:  Head: Normocephalic and atraumatic.  Eyes: Conjunctivae and EOM are normal.  Neck: Normal range of motion.  Cardiovascular: Normal rate,  regular rhythm and intact distal pulses.  Exam reveals no gallop and no friction rub.   No murmur heard. Pulmonary/Chest: Effort normal and breath sounds normal. She has no wheezes. She has no rales. She exhibits no tenderness.  Abdominal: Soft. She exhibits no distension. There is no tenderness. There is no rebound and no guarding.  Musculoskeletal: Normal range of motion.  Bilateral lower extremity pitting edema with tibial abrasions and weeping. No calf tenderness to palpation.   Neurological: She is alert and oriented to person, place, and time. Coordination normal.  Speech is goal-oriented. Moves limbs without ataxia.   Skin: Skin is warm and dry.  Psychiatric: She has a normal mood and affect. Her behavior is normal.    ED Course  Procedures (including critical care time)   Date: 07/02/2013  Rate: 89  Rhythm: normal sinus rhythm  QRS Axis: normal  Intervals: normal  ST/T Wave abnormalities: early repolarization  Conduction Disutrbances:none  Narrative Interpretation: NSR without acute changes from previous  Old EKG Reviewed: unchanged    Labs Review Labs Reviewed  CBC WITH DIFFERENTIAL - Abnormal; Notable for the following:    RBC 2.49 (*)    Hemoglobin 8.6 (*)    HCT 25.2 (*)    MCV 101.2 (*)    MCH 34.5 (*)    RDW 17.0 (*)    Neutrophils Relative % 78 (*)    Lymphocytes Relative 11 (*)    All other components within normal limits  BASIC METABOLIC PANEL - Abnormal; Notable for the following:    CO2 16 (*)    Glucose, Bld 188 (*)    BUN 62 (*)    Creatinine, Ser 2.10 (*)    Calcium 8.1 (*)    GFR calc non Af Amer 26 (*)    GFR calc Af Amer 30 (*)    All other components within normal limits  PRO B NATRIURETIC PEPTIDE - Abnormal; Notable for the following:    Pro B Natriuretic peptide (BNP) 294.3 (*)    All other components within normal limits  URINALYSIS, ROUTINE W REFLEX MICROSCOPIC - Abnormal; Notable for the following:    Hgb urine dipstick TRACE (*)     Protein, ur 100 (*)    All other components within normal limits  GLUCOSE, CAPILLARY - Abnormal; Notable for the following:    Glucose-Capillary 273 (*)    All other components within normal limits  URINE CULTURE  URINE MICROSCOPIC-ADD ON   Imaging Review Dg Chest 2 View  07/02/2013   CLINICAL DATA:  Chest pain, shortness of breath, and cough.  EXAM: CHEST  2 VIEW  COMPARISON:  06/18/2013  FINDINGS: Heart size and pulmonary vascularity are normal. There is peribronchial thickening as well as a nodule in the left lower lobe, unchanged. There is slight scarring or atelectasis in the left lower lobe. This is also unchanged. No consolidative infiltrates or effusions. No acute osseous abnormality.  IMPRESSION: Inflammatory changes as described, unchanged since the prior exam.   Electronically Signed   By: Rozetta Nunnery M.D.   On: 07/02/2013 18:24    EKG Interpretation   None       MDM   1. Leg swelling     3:10 PM Labs unremarkable for acute changes. Vitals stable and  patient afebrile.   3:36 PM Patient likely has chronic bilateral lower extremity edema. Patient will be discharged with pain medication and recommended follow up with PCP. No further evaluation needed at this time. Labs unchanged at this time.   Alvina Chou, [place] 07/03/13 [phone]

## 2013-07-02 NOTE — Progress Notes (Signed)
[facility] ED CM consulted by Dr. Micheline Maze to meet with patient regarding HH issue. Pt present to ED with bilateral leg swelling. Pt has had 3 ED visits and 4 admissions within 6 months. PMH CHF, DOE,CVA,  CKD, Seizures, HIV,Mennigitis. Pt was recently discharged with [facility] RN services. In room to speak with patient. Pt states, that she receives [place] [facility]  RN services with [facility] twice/ week, and PT has come out however, has not been able to work with them due to DOE. Pt states, she is happy with the [facility] services and will continue with services. Discussed with Dr. Micheline Maze EDP. No further CM needs identified.

## 2013-07-02 NOTE — Progress Notes (Signed)
Case discussed with Dr. Li at time of visit.  We reviewed the resident's history and exam and pertinent patient test results.  I agree with the assessment, diagnosis, and plan of care documented in the resident's note. 

## 2013-07-02 NOTE — ED Notes (Signed)
Pt's family concerned about disposition to home, requesting re-evaluation. ED MD is aware and will speak with family and re-evaluate.

## 2013-07-02 NOTE — ED Notes (Signed)
Called pt's family member Colon Branch who is willing to pick pt up. She will be here shortly.

## 2013-07-02 NOTE — ED Notes (Signed)
Pts family at bedside, update given.

## 2013-07-02 NOTE — Discharge Instructions (Signed)
Take Percocet as needed for pain. Refer to attached documents for more information. Follow up with your doctor as soon as possible. Return to the ED with worsening or concerning symptoms.    Edema Edema is an abnormal build-up of fluids in tissues. Because this is partly dependent on gravity (water flows to the lowest place), it is more common in the legs and thighs (lower extremities). It is also common in the looser tissues, like around the eyes. Painless swelling of the feet and ankles is common and increases as a person ages. It may affect both legs and may include the calves or even thighs. When squeezed, the fluid may move out of the affected area and may leave a dent for a few moments. CAUSES   Prolonged standing or sitting in one place for extended periods of time. Movement helps pump tissue fluid into the veins, and absence of movement prevents this, resulting in edema.  Varicose veins. The valves in the veins do not work as well as they should. This causes fluid to leak into the tissues.  Fluid and salt overload.  Injury, burn, or surgery to the leg, ankle, or foot, may damage veins and allow fluid to leak out.  Sunburn damages vessels. Leaky vessels allow fluid to go out into the sunburned tissues.  Allergies (from insect bites or stings, medications or chemicals) cause swelling by allowing vessels to become leaky.  Protein in the blood helps keep fluid in your vessels. Low protein, as in malnutrition, allows fluid to leak out.  Hormonal changes, including pregnancy and menstruation, cause fluid retention. This fluid may leak out of vessels and cause edema.  Medications that cause fluid retention. Examples are sex hormones, blood pressure medications, steroid treatment, or anti-depressants.  Some illnesses cause edema, especially heart failure, kidney disease, or liver disease.  Surgery that cuts veins or lymph nodes, such as surgery done for the heart or for breast cancer, may  result in edema. DIAGNOSIS  Your caregiver is usually easily able to determine what is causing your swelling (edema) by simply asking what is wrong (getting a history) and examining you (doing a physical). Sometimes x-rays, EKG (electrocardiogram or heart tracing), and blood work may be done to evaluate for underlying medical illness. TREATMENT  General treatment includes:  Leg elevation (or elevation of the affected body part).  Restriction of fluid intake.  Prevention of fluid overload.  Compression of the affected body part. Compression with elastic bandages or support stockings squeezes the tissues, preventing fluid from entering and forcing it back into the blood vessels.  Diuretics (also called water pills or fluid pills) pull fluid out of your body in the form of increased urination. These are effective in reducing the swelling, but can have side effects and must be used only under your caregiver's supervision. Diuretics are appropriate only for some types of edema. The specific treatment can be directed at any underlying causes discovered. Heart, liver, or kidney disease should be treated appropriately. HOME CARE INSTRUCTIONS   Elevate the legs (or affected body part) above the level of the heart, while lying down.  Avoid sitting or standing still for prolonged periods of time.  Avoid putting anything directly under the knees when lying down, and do not wear constricting clothing or garters on the upper legs.  Exercising the legs causes the fluid to work back into the veins and lymphatic channels. This may help the swelling go down.  The pressure applied by elastic bandages or support stockings  can help reduce ankle swelling.  A low-salt diet may help reduce fluid retention and decrease the ankle swelling.  Take any medications exactly as prescribed. SEEK MEDICAL CARE IF:  Your edema is not responding to recommended treatments. SEEK IMMEDIATE MEDICAL CARE IF:   You develop  shortness of breath or chest pain.  You cannot breathe when you lay down; or if, while lying down, you have to get up and go to the window to get your breath.  You are having increasing swelling without relief from treatment.  You develop a fever over 102 F (38.9 C).  You develop pain or redness in the areas that are swollen.  Tell your caregiver right away if you have gained 03 lb/1.4 kg in 1 day or 05 lb/2.3 kg in a week. MAKE SURE YOU:   Understand these instructions.  Will watch your condition.  Will get help right away if you are not doing well or get worse. Document Released: 05/16/2005 Document Revised: 11/15/2011 Document Reviewed: 01/02/2008 [facility] Patient Information 2014 [employer], [place].

## 2013-07-02 NOTE — ED Notes (Signed)
Pt at MD office today for conjunctivitis. After appointment, Pt states her legs became more swollen and painful 10/10. Pt has abrasions on leg from scratching them. Pt has hx of CHF, renal failure without dialysis. 180/90. HR 88

## 2013-07-02 NOTE — ED Notes (Signed)
Pt took home dose of dilantin 100mg   And 50mg  hydralazine. MD aware and ok with this.

## 2013-07-02 NOTE — ED Provider Notes (Signed)
  Pt's family member has arrived, is upset she is being d/c'd.  I have reexamined the pt.  On my exam pt is hypertensive, but in NAD.  O&Ax3.  +BLLE edema.  Cardiopulm exam beign.  I agree with prior w/u, have added UA & CXR where were negative for acute findings. Pt given labetalol for elevated HR, but has no s/sx of end organ damage based on lab w/u or HPI.  She can continue lasix 80mg  BID, as well as elevation and wrapping of legs. Case mgmt saw pt, she has confirmed her twice weekly home health.  Return precautions given for new or worsening symptoms including CP, SOB, fever. She can continue optho recommendations from earlier today.    Shanna Cisco, MD 07/02/13 [phone]

## 2013-07-02 NOTE — ED Notes (Signed)
pts family states that her coat and her belongings were found in the car.

## 2013-07-03 LAB — URINE CULTURE: Colony Count: 40000

## 2013-07-03 NOTE — Progress Notes (Signed)
Case discussed with Dr. Gill at the time of the visit.  We reviewed the resident's history and exam and pertinent patient test results.  I agree with the assessment, diagnosis, and plan of care documented in the resident's note. 

## 2013-07-03 NOTE — ED Provider Notes (Signed)
Medical screening examination/treatment/procedure(s) were conducted as a shared visit with non-physician practitioner(s) and myself.  I personally evaluated the patient during the encounter.  EKG Interpretation   None       Pt with multiple medical problems including chronic LE edema. No clinical or lab signs of significant fluid overload.   Nussen Pullin B. Bernette Mayers, MD 07/03/13 [phone]

## 2013-07-03 NOTE — Telephone Encounter (Signed)
Was seen in clinic 07/01/13 Dr Dierdre Searles.

## 2013-07-05 ENCOUNTER — Encounter: Payer: Medicaid Other | Admitting: Internal Medicine

## 2013-07-05 ENCOUNTER — Encounter: Payer: Self-pay | Admitting: Internal Medicine

## 2013-07-05 ENCOUNTER — Encounter (HOSPITAL_COMMUNITY): Payer: Self-pay | Admitting: General Practice

## 2013-07-05 ENCOUNTER — Inpatient Hospital Stay (HOSPITAL_COMMUNITY)
Admission: AD | Admit: 2013-07-05 | Discharge: 2013-07-11 | DRG: 682 | Disposition: A | Discharge status: Home / Self Care | Payer: Medicaid Other | Source: Ambulatory Visit | Attending: Infectious Disease | Admitting: Infectious Disease

## 2013-07-05 ENCOUNTER — Ambulatory Visit (INDEPENDENT_AMBULATORY_CARE_PROVIDER_SITE_OTHER): Payer: Medicaid Other | Admitting: Internal Medicine

## 2013-07-05 ENCOUNTER — Inpatient Hospital Stay (HOSPITAL_COMMUNITY): Payer: Medicaid Other

## 2013-07-05 VITALS — BP 129/76 | HR 92 | Temp 98.3°F | Ht 65.5 in | Wt 203.1 lb

## 2013-07-05 DIAGNOSIS — R600 Localized edema: Secondary | ICD-10-CM

## 2013-07-05 DIAGNOSIS — I272 Pulmonary hypertension, unspecified: Secondary | ICD-10-CM

## 2013-07-05 DIAGNOSIS — I129 Hypertensive chronic kidney disease with stage 1 through stage 4 chronic kidney disease, or unspecified chronic kidney disease: Secondary | ICD-10-CM | POA: Diagnosis present

## 2013-07-05 DIAGNOSIS — R609 Edema, unspecified: Secondary | ICD-10-CM

## 2013-07-05 DIAGNOSIS — R0602 Shortness of breath: Secondary | ICD-10-CM | POA: Diagnosis present

## 2013-07-05 DIAGNOSIS — Z833 Family history of diabetes mellitus: Secondary | ICD-10-CM

## 2013-07-05 DIAGNOSIS — N183 Chronic kidney disease, stage 3 unspecified: Secondary | ICD-10-CM | POA: Diagnosis present

## 2013-07-05 DIAGNOSIS — R569 Unspecified convulsions: Secondary | ICD-10-CM

## 2013-07-05 DIAGNOSIS — N179 Acute kidney failure, unspecified: Principal | ICD-10-CM | POA: Diagnosis present

## 2013-07-05 DIAGNOSIS — M329 Systemic lupus erythematosus, unspecified: Secondary | ICD-10-CM

## 2013-07-05 DIAGNOSIS — B182 Chronic viral hepatitis C: Secondary | ICD-10-CM | POA: Diagnosis present

## 2013-07-05 DIAGNOSIS — Z8249 Family history of ischemic heart disease and other diseases of the circulatory system: Secondary | ICD-10-CM

## 2013-07-05 DIAGNOSIS — N189 Chronic kidney disease, unspecified: Secondary | ICD-10-CM | POA: Diagnosis present

## 2013-07-05 DIAGNOSIS — D591 Autoimmune hemolytic anemia, unspecified: Secondary | ICD-10-CM

## 2013-07-05 DIAGNOSIS — N049 Nephrotic syndrome with unspecified morphologic changes: Secondary | ICD-10-CM | POA: Diagnosis present

## 2013-07-05 DIAGNOSIS — G9340 Encephalopathy, unspecified: Secondary | ICD-10-CM

## 2013-07-05 DIAGNOSIS — K7460 Unspecified cirrhosis of liver: Secondary | ICD-10-CM

## 2013-07-05 DIAGNOSIS — E669 Obesity, unspecified: Secondary | ICD-10-CM | POA: Diagnosis present

## 2013-07-05 DIAGNOSIS — Z8673 Personal history of transient ischemic attack (TIA), and cerebral infarction without residual deficits: Secondary | ICD-10-CM

## 2013-07-05 DIAGNOSIS — M109 Gout, unspecified: Secondary | ICD-10-CM | POA: Diagnosis present

## 2013-07-05 DIAGNOSIS — E119 Type 2 diabetes mellitus without complications: Secondary | ICD-10-CM | POA: Diagnosis present

## 2013-07-05 DIAGNOSIS — I10 Essential (primary) hypertension: Secondary | ICD-10-CM | POA: Diagnosis present

## 2013-07-05 DIAGNOSIS — I2789 Other specified pulmonary heart diseases: Secondary | ICD-10-CM | POA: Diagnosis present

## 2013-07-05 DIAGNOSIS — L97809 Non-pressure chronic ulcer of other part of unspecified lower leg with unspecified severity: Secondary | ICD-10-CM | POA: Diagnosis present

## 2013-07-05 DIAGNOSIS — R4701 Aphasia: Secondary | ICD-10-CM

## 2013-07-05 DIAGNOSIS — I639 Cerebral infarction, unspecified: Secondary | ICD-10-CM

## 2013-07-05 DIAGNOSIS — B20 Human immunodeficiency virus [HIV] disease: Secondary | ICD-10-CM | POA: Diagnosis present

## 2013-07-05 DIAGNOSIS — I5189 Other ill-defined heart diseases: Secondary | ICD-10-CM | POA: Diagnosis present

## 2013-07-05 DIAGNOSIS — B1920 Unspecified viral hepatitis C without hepatic coma: Secondary | ICD-10-CM

## 2013-07-05 DIAGNOSIS — E8809 Other disorders of plasma-protein metabolism, not elsewhere classified: Secondary | ICD-10-CM | POA: Diagnosis present

## 2013-07-05 DIAGNOSIS — Z6832 Body mass index (BMI) 32.0-32.9, adult: Secondary | ICD-10-CM

## 2013-07-05 DIAGNOSIS — G40909 Epilepsy, unspecified, not intractable, without status epilepticus: Secondary | ICD-10-CM | POA: Diagnosis present

## 2013-07-05 DIAGNOSIS — I5031 Acute diastolic (congestive) heart failure: Secondary | ICD-10-CM

## 2013-07-05 DIAGNOSIS — R911 Solitary pulmonary nodule: Secondary | ICD-10-CM

## 2013-07-05 DIAGNOSIS — I5032 Chronic diastolic (congestive) heart failure: Secondary | ICD-10-CM | POA: Diagnosis present

## 2013-07-05 DIAGNOSIS — I509 Heart failure, unspecified: Secondary | ICD-10-CM | POA: Diagnosis present

## 2013-07-05 DIAGNOSIS — I5030 Unspecified diastolic (congestive) heart failure: Secondary | ICD-10-CM

## 2013-07-05 HISTORY — DX: Pure hypercholesterolemia, unspecified: E78.00

## 2013-07-05 LAB — CBC WITH DIFFERENTIAL/PLATELET
Basophils Absolute: 0 10*3/uL (ref 0.0–0.1)
Basophils Relative: 0 % (ref 0–1)
Eosinophils Absolute: 0 10*3/uL (ref 0.0–0.7)
Eosinophils Relative: 0 % (ref 0–5)
HEMATOCRIT: 19.8 % — AB (ref 36.0–46.0)
Hemoglobin: 6.9 g/dL — CL (ref 12.0–15.0)
LYMPHS ABS: 1.3 10*3/uL (ref 0.7–4.0)
Lymphocytes Relative: 17 % (ref 12–46)
MCH: 35.4 pg — ABNORMAL HIGH (ref 26.0–34.0)
MCHC: 34.8 g/dL (ref 30.0–36.0)
MCV: 101.5 fL — ABNORMAL HIGH (ref 78.0–100.0)
Monocytes Absolute: 0.5 10*3/uL (ref 0.1–1.0)
Monocytes Relative: 7 % (ref 3–12)
NEUTROS ABS: 5.9 10*3/uL (ref 1.7–7.7)
Neutrophils Relative %: 76 % (ref 43–77)
PLATELETS: 181 10*3/uL (ref 150–400)
RBC: 1.95 MIL/uL — ABNORMAL LOW (ref 3.87–5.11)
RDW: 17 % — AB (ref 11.5–15.5)
WBC: 7.7 10*3/uL (ref 4.0–10.5)

## 2013-07-05 LAB — COMPREHENSIVE METABOLIC PANEL
ALBUMIN: 1.5 g/dL — AB (ref 3.5–5.2)
ALK PHOS: 249 U/L — AB (ref 39–117)
ALT: 47 U/L — ABNORMAL HIGH (ref 0–35)
AST: 40 U/L — ABNORMAL HIGH (ref 0–37)
BILIRUBIN TOTAL: 2.5 mg/dL — AB (ref 0.3–1.2)
BUN: 55 mg/dL — ABNORMAL HIGH (ref 6–23)
CHLORIDE: 101 meq/L (ref 96–112)
CO2: 16 mEq/L — ABNORMAL LOW (ref 19–32)
Calcium: 7.8 mg/dL — ABNORMAL LOW (ref 8.4–10.5)
Creatinine, Ser: 2.28 mg/dL — ABNORMAL HIGH (ref 0.50–1.10)
GFR calc non Af Amer: 23 mL/min — ABNORMAL LOW (ref >90)
GFR, EST AFRICAN AMERICAN: 27 mL/min — AB (ref >90)
GLUCOSE: 289 mg/dL — AB (ref 70–99)
POTASSIUM: 3.6 meq/L — AB (ref 3.7–5.3)
SODIUM: 136 meq/L — AB (ref 137–147)
Total Protein: 5.8 g/dL — ABNORMAL LOW (ref 6.0–8.3)

## 2013-07-05 LAB — TROPONIN I: Troponin I: <0.3 ng/mL (ref <0.30)

## 2013-07-05 LAB — APTT: aPTT: 20 seconds — ABNORMAL LOW (ref 24–37)

## 2013-07-05 LAB — GLUCOSE, CAPILLARY
Glucose-Capillary: 251 mg/dL — ABNORMAL HIGH (ref 70–99)
Glucose-Capillary: 321 mg/dL — ABNORMAL HIGH (ref 70–99)

## 2013-07-05 LAB — PROTIME-INR
INR: 0.87 (ref 0.00–1.49)
Prothrombin Time: 11.7 seconds (ref 11.6–15.2)

## 2013-07-05 MED ORDER — PANTOPRAZOLE SODIUM 40 MG PO TBEC
40.0000 mg | DELAYED_RELEASE_TABLET | Freq: Every day | ORAL | Status: DC
Start: 2013-07-06 — End: 2013-07-11
  Administered 2013-07-06 – 2013-07-11 (×6): 40 mg via ORAL
  Filled 2013-07-05 (×6): qty 1

## 2013-07-05 MED ORDER — ENOXAPARIN SODIUM 40 MG/0.4ML ~~LOC~~ SOLN
40.0000 mg | SUBCUTANEOUS | Status: DC
  Administered 2013-07-06 (×2): 40 mg via SUBCUTANEOUS
  Filled 2013-07-05 (×3): qty 0.4

## 2013-07-05 MED ORDER — RITONAVIR 100 MG PO TABS
100.0000 mg | ORAL_TABLET | Freq: Every day | ORAL | Status: DC
  Administered 2013-07-06 – 2013-07-11 (×6): 100 mg via ORAL
  Filled 2013-07-05 (×7): qty 1

## 2013-07-05 MED ORDER — PHENYTOIN 50 MG PO CHEW
100.0000 mg | CHEWABLE_TABLET | Freq: Three times a day (TID) | ORAL | Status: DC
  Administered 2013-07-06 – 2013-07-11 (×18): 100 mg via ORAL
  Filled 2013-07-05 (×19): qty 2

## 2013-07-05 MED ORDER — INSULIN ASPART 100 UNIT/ML ~~LOC~~ SOLN
0.0000 [IU] | Freq: Three times a day (TID) | SUBCUTANEOUS | Status: DC
Start: 2013-07-06 — End: 2013-07-07
  Administered 2013-07-06 (×3): 5 [IU] via SUBCUTANEOUS
  Administered 2013-07-07: 9 [IU] via SUBCUTANEOUS

## 2013-07-05 MED ORDER — ABACAVIR SULFATE 300 MG PO TABS
600.0000 mg | ORAL_TABLET | Freq: Every day | ORAL | Status: DC
  Administered 2013-07-06 – 2013-07-11 (×6): 600 mg via ORAL
  Filled 2013-07-05 (×6): qty 2

## 2013-07-05 MED ORDER — ASPIRIN EC 81 MG PO TBEC
81.0000 mg | DELAYED_RELEASE_TABLET | Freq: Every day | ORAL | Status: DC
  Administered 2013-07-06: 81 mg via ORAL
  Filled 2013-07-05 (×2): qty 1

## 2013-07-05 MED ORDER — INSULIN GLARGINE 100 UNIT/ML ~~LOC~~ SOLN
30.0000 [IU] | Freq: Every day | SUBCUTANEOUS | Status: DC
  Administered 2013-07-06: 30 [IU] via SUBCUTANEOUS
  Filled 2013-07-05 (×2): qty 0.3

## 2013-07-05 MED ORDER — POTASSIUM CHLORIDE CRYS ER 20 MEQ PO TBCR
40.0000 meq | EXTENDED_RELEASE_TABLET | Freq: Once | ORAL | Status: AC
  Administered 2013-07-06: 40 meq via ORAL
  Filled 2013-07-05: qty 2

## 2013-07-05 MED ORDER — ACYCLOVIR 800 MG PO TABS
800.0000 mg | ORAL_TABLET | Freq: Every day | ORAL | Status: DC
  Administered 2013-07-06 – 2013-07-11 (×6): 800 mg via ORAL
  Filled 2013-07-05 (×6): qty 1

## 2013-07-05 MED ORDER — INSULIN ASPART 100 UNIT/ML ~~LOC~~ SOLN
0.0000 [IU] | Freq: Every day | SUBCUTANEOUS | Status: DC
Start: 2013-07-05 — End: 2013-07-07
  Administered 2013-07-06 (×2): 4 [IU] via SUBCUTANEOUS

## 2013-07-05 MED ORDER — LEVETIRACETAM 750 MG PO TABS
1500.0000 mg | ORAL_TABLET | Freq: Two times a day (BID) | ORAL | Status: DC
  Administered 2013-07-06 – 2013-07-11 (×12): 1500 mg via ORAL
  Filled 2013-07-05 (×14): qty 2

## 2013-07-05 MED ORDER — LAMIVUDINE 150 MG PO TABS
150.0000 mg | ORAL_TABLET | Freq: Every day | ORAL | Status: DC
  Administered 2013-07-06: 150 mg via ORAL
  Filled 2013-07-05: qty 1

## 2013-07-05 MED ORDER — LABETALOL HCL 100 MG PO TABS
100.0000 mg | ORAL_TABLET | Freq: Two times a day (BID) | ORAL | Status: DC
  Administered 2013-07-06 – 2013-07-11 (×12): 100 mg via ORAL
  Filled 2013-07-05 (×13): qty 1

## 2013-07-05 MED ORDER — CLONIDINE HCL 0.2 MG PO TABS
0.2000 mg | ORAL_TABLET | Freq: Two times a day (BID) | ORAL | Status: DC
  Administered 2013-07-06 – 2013-07-07 (×4): 0.2 mg via ORAL
  Filled 2013-07-05 (×5): qty 1

## 2013-07-05 MED ORDER — DARUNAVIR ETHANOLATE 800 MG PO TABS
800.0000 mg | ORAL_TABLET | Freq: Every day | ORAL | Status: DC
Start: 2013-07-06 — End: 2013-07-11
  Administered 2013-07-06 – 2013-07-11 (×6): 800 mg via ORAL
  Filled 2013-07-05 (×7): qty 1

## 2013-07-05 MED ORDER — HYDRALAZINE HCL 50 MG PO TABS
50.0000 mg | ORAL_TABLET | Freq: Three times a day (TID) | ORAL | Status: DC
  Filled 2013-07-05: qty 1

## 2013-07-05 NOTE — Progress Notes (Signed)
Patient ID: Michelle [name], female   DOB: 1960/06/28, 54 y.o.   MRN: [id number] Subjective:   Patient ID: Michelle [name] female   DOB: 03-24-1961 53 y.o.   MRN: [id number]  CC:   ED follow up visit.        HPI:  Michelle [name] is a 53 y.o. lady with PMH CHF (EF normal, with Grade-2 diastolic dysfunction), HIV (CD4 490 and VL<20 on 05/07/13), seizure disorder, hx of stroke, HTN, CKD, gout, and AIHA, who presents for ED followup visit today  Patient has a history of diastolic congestive heart failure, 2D echo on 05/31/13  Showed normal ejection fraction with grade 2 diastolic dysfunction. Because of worsening bilateral leg edema, patient was seen in clinic on 06/28/12, his Lasix dose was increased to 80 mg twice a day (on 40 mg in AM and 80 mg in PM). She continued to have leg edema, she visited ED on 07/02/13. She was found to have pro BNP 294, stable creatinine (2.0-->2.10) and an unchanged finding on chest x-[place] in ED. She was discharged home with continuation of Lasix 80 mg twice a day. Of note, patient has AIHA and is taking prednisone which may have contributed to fluid retention. He also has nephrotic syndrome with albumin 2.6..    Past Medical History  Diagnosis Date  . Seizures   . Stroke   . Meningitis   . HIV (human immunodeficiency virus infection)   . Hypertension   . Gout   . Muscle spasms of head and/or neck   . CKD (chronic kidney disease)   . CHF (congestive heart failure)     Michelle [name] 06/18/2013  . HCV (hepatitis C virus)     chronic/notes 06/18/2013  . Type II diabetes mellitus     Michelle [name] 06/18/2013  . AIHA (autoimmune hemolytic anemia)     Michelle [name] 06/18/2013  . Hypertensive encephalopathy ~ 05/2013    hospitalaized/notes 06/18/2013  . Daily headache     "for the last 6 years/notes 06/18/2013  . Exertional shortness of breath     Michelle [name] 06/18/2013  . Anxiety     Michelle [name] 06/18/2013  . Nephrotic syndrome   . History of syphilis    Current Outpatient Prescriptions  Medication Sig  Dispense Refill  . abacavir (ZIAGEN) 300 MG tablet Take 2 tablets (600 mg total) by mouth daily.  60 tablet  6  . acyclovir (ZOVIRAX) 800 MG tablet Take 1 tablet (800 mg total) by mouth daily.  30 tablet  3  . aspirin EC 81 MG tablet Take 1 tablet (81 mg total) by mouth daily.  30 tablet  11  . Blood Glucose Monitoring Suppl (ACCU-CHEK AVIVA PLUS) W/DEVICE KIT USE AS DIRECTED  1 kit  0  . cholecalciferol (VITAMIN D) 1000 UNITS tablet Take 1,000 Units by mouth daily.      . cloNIDine (CATAPRES) 0.2 MG tablet Take 1 tablet (0.2 mg total) by mouth 2 (two) times daily.  60 tablet  3  . Darunavir Ethanolate (PREZISTA) 800 MG tablet Take 1 tablet (800 mg total) by mouth daily with breakfast.  30 tablet  6  . furosemide (LASIX) 40 MG tablet Take 40 mg by mouth 2 (two) times daily.      [name] glucose blood (ACCU-CHEK AVIVA) test strip Use as instructed  100 each  12  . hydrALAZINE (APRESOLINE) 50 MG tablet Take 50 mg by mouth 3 (three) times daily.      . insulin aspart (NOVOLOG) 100 UNIT/ML injection  Inject into the skin 3 (three) times daily before meals. Home Sliding scale      . insulin glargine (LANTUS) 100 UNIT/ML injection Inject 30 Units into the skin at bedtime.      [name] labetalol (NORMODYNE) 100 MG tablet Take 1 tablet (100 mg total) by mouth 2 (two) times daily.  60 tablet  3  . lamiVUDine (EPIVIR) 150 MG tablet Take 1 tablet (150 mg total) by mouth daily.  30 tablet  6  . levETIRAcetam (KEPPRA) 500 MG tablet Take 3 tablets (1,500 mg total) by mouth every 12 (twelve) hours.  180 tablet  3  . lisinopril (PRINIVIL,ZESTRIL) 40 MG tablet Take 1 tablet (40 mg total) by mouth daily.  30 tablet  3  . LORazepam (ATIVAN) 1 MG tablet Take 1 mg by mouth daily. Take one tablet for seizure prevention.      . Oxycodone HCl 10 MG TABS Take 1 tablet (10 mg total) by mouth every 6 (six) hours as needed (severe pain).  12 tablet  0  . pantoprazole (PROTONIX) 40 MG tablet Take 1 tablet (40 mg total) by mouth daily.  30  tablet  11  . phenytoin (DILANTIN) 50 MG tablet Chew 2 tablets (100 mg total) by mouth 3 (three) times daily.  180 tablet  3  . ritonavir (NORVIR) 100 MG TABS tablet Take 100 mg by mouth daily with breakfast.       No current facility-administered medications for this visit.   Family History  Problem Relation Age of Onset  . Cancer - Colon Mother   . Cancer Father   . Diabetes    . Hypertension Father    History   Social History  . Marital Status: Single    Spouse Name: N/A    Number of Children: 4  . Years of Education: 11th   Social History Main Topics  . Smoking status: Former Smoker    Types: Cigarettes    Quit date: 06/19/2010  . Smokeless tobacco: Never Used  . Alcohol Use: No  . Drug Use: No     Comment: past history of alcohol, cocaine and IV drug use  . Sexual Activity: Not Currently    Partners: Male     Comment: given condoms   Other Topics Concern  . Not on file   Social History Narrative   Patient lives at home with sister.    Patient is unemployed.    Patient is single.    Patient has 2 alive and 2 deceased.    Patient has 11th grade education.           Review of Systems: Full 14-point review of systems otherwise negative. See HPI.   Objective:  Physical Exam: There were no vitals filed for this visit. Constitutional: Vital signs reviewed.  Patient is a well-developed and well-nourished, in no acute distress and cooperative with exam.   HEENT:  Head: Normocephalic and atraumatic Ear: TM normal bilaterally Mouth: no erythema or exudates, MMM Eyes: PERRL, EOMI, conjunctivae normal, No scleral icterus.  Neck: Supple, Trachea midline normal ROM, No JVD  Cardiovascular: RRR, S1 normal, S2 normal, no MRG, pulses symmetric and intact bilaterally Pulmonary/Chest: CTAB, no wheezes, rales, or rhonchi Abdominal: Soft. Non-tender, non-distended, bowel sounds are normal, no masses, organomegaly, or guarding present.  GU: no CVA  tenderness Musculoskeletal: No joint deformities, erythema, or stiffness, ROM full and non-tender Extremities: No leg edema Hematology: no cervical, inginal, or axillary adenopathy.  Neurological: A&O x3, Strength is normal  and symmetric bilaterally, cranial nerve II-XII are grossly intact, no focal motor deficit, sensory intact to light touch bilaterally. Brachial reflex 2+ bilaterally. Knee reflex 2+ bilaterally. Babinski's sign negative. Finger to nose test normal. Skin: Warm, dry and intact. No rash, cyanosis, or clubbing.  Psychiatric: Normal mood and affect. No suicidal or homicidal ideation.  Assessment & Plan:    This encounter was created in error - please disregard.

## 2013-07-05 NOTE — Progress Notes (Signed)
Patient states "You better get it on first time" in regards to IV. IV team made aware and to come start IV site on patient.

## 2013-07-05 NOTE — Progress Notes (Addendum)
Brief Interval Progress Note  The patient was admitted today for acute on chronic renal failure, and volume overload.  Admission CBC revealed a Hb of 6.9, down from 8.6 three days ago.  Additionally, total bilirubin is elevated today at 2.5 (previously 0.6 on 1/28).  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  Since that time, Hb has been stable around 8-9, and the patient has been on a prednisone taper, from 40 mg daily, currently at 20 mg daily since 1/29.  I'm concerned that her present drop in hemoglobin represents recurrent AIHA, though the trigger is unclear.  After reviewing her medication list, the only one listed as possibly associated with AIHA is her hydralazine. -repeat CBC, type and screen -check LDH, haptoglobin, fractionated bilirubin -check Coombs test, peripheral smear, G6PD -check FOBT -d/c hydralazine for now -if above labs support hemolytic anemia, we will plan to check serial CBC's, increase prednisone back to 40 mg daily, and consider transfusion   Signed, Janalyn Harder, PGY3 07/05/2013, 10:43 PM

## 2013-07-05 NOTE — Progress Notes (Signed)
CRITICAL VALUE ALERT  Critical value received:  Hgb 6.9  Date of notification:  07/05/13  Time of notification:  2206  Critical value read back:yes  Nurse who received alert:  Richarda Overlie, charge RN  MD notified (1st page):  Dr. Mariea Clonts  Time of first page:  2207  Responding MD:  Dr. Mariea Clonts  Time MD responded:  2210

## 2013-07-05 NOTE — H&P (Signed)
Hospital Admission Note Date: 07/05/2013  Patient name: Michelle [name] Medical record number: [id number] Date of birth: [birth year] Age: 53 y.o. Gender: female PCP: Clinton Gallant, MD  Medical Service: Internal Medicine   Attending physician: Dr Tommy Medal     1st Contact: Dr Johnnette Gourd  Pager: [phone] 2nd Contact: Dr Jessee Avers  Pager: [phone] After 5 pm or weekends: 1st Contact:      Pager: [phone] 2nd Contact:      Pager: [phone]  Chief Complaint: Admitted for inpatient diuretic management.  History of Present Illness: 53 year old woman, with past medical history of dCHF (EF normal, with Grade-2 diastolic dysfunction), HIV (CD4 490 and VL<20 on 05/07/13), chronic HCV, seizure disorder, hx of stroke, HTN, T2DM, CKD, gout, and AIHA, who is admitted through the [facility] for inpatient diuretic therapy. Her outpatient diuretic therapy has been complicated with a raising creatinine and increasing bilateral lower extremity edema with fluid drainage from her legs.  Patient reports that her legs have been swollen over the last 1 month. However, the swelling has increased in the past 2-3 weeks with increasing clear, yellow fluid drainage coming from bilateral shins. She also noticed increased lower extremity itching and this has resulted into superficial ulcerations of her legs after repeated scratching. She also reports that her right lower extremity is significantly larger than the left. She denies fevers, chills, or nausea or vomiting. Her appetite has been normal. Her home regimen includes furosemide 80 mg twice a day with excellent compliance. However, her creatinine is raised from 1.73 on 06/19/2013 to 2.1 on 07/02/2013 which prompted admission for fluid overload management. She has gained 6lb over the last 3 weeks ( 198 lb at her discharge on 06/20/13 to 203 today).  She denies PND, orthopnea, or current chest pain. However, she reports that over the last several days she has been having cough with  yellow phlegm production, associated with wheezing, sore throat, but without fevers. She denies hemoptysis.    Meds: Medications Prior to Admission  Medication Sig Dispense Refill  . abacavir (ZIAGEN) 300 MG tablet Take 2 tablets (600 mg total) by mouth daily.  60 tablet  6  . acyclovir (ZOVIRAX) 800 MG tablet Take 1 tablet (800 mg total) by mouth daily.  30 tablet  3  . aspirin EC 81 MG tablet Take 1 tablet (81 mg total) by mouth daily.  30 tablet  11  . Blood Glucose Monitoring Suppl (ACCU-CHEK AVIVA PLUS) W/DEVICE KIT USE AS DIRECTED  1 kit  0  . cholecalciferol (VITAMIN D) 1000 UNITS tablet Take 1,000 Units by mouth daily.      . cloNIDine (CATAPRES) 0.2 MG tablet Take 1 tablet (0.2 mg total) by mouth 2 (two) times daily.  60 tablet  3  . Darunavir Ethanolate (PREZISTA) 800 MG tablet Take 1 tablet (800 mg total) by mouth daily with breakfast.  30 tablet  6  . furosemide (LASIX) 40 MG tablet Take 40 mg by mouth 2 (two) times daily.      [name] glucose blood (ACCU-CHEK AVIVA) test strip Use as instructed  100 each  12  . hydrALAZINE (APRESOLINE) 50 MG tablet Take 50 mg by mouth 3 (three) times daily.      . insulin aspart (NOVOLOG) 100 UNIT/ML injection Inject into the skin 3 (three) times daily before meals. Home Sliding scale      . insulin glargine (LANTUS) 100 UNIT/ML injection Inject 30 Units into the skin at bedtime.      [name]  labetalol (NORMODYNE) 100 MG tablet Take 1 tablet (100 mg total) by mouth 2 (two) times daily.  60 tablet  3  . lamiVUDine (EPIVIR) 150 MG tablet Take 1 tablet (150 mg total) by mouth daily.  30 tablet  6  . levETIRAcetam (KEPPRA) 500 MG tablet Take 3 tablets (1,500 mg total) by mouth every 12 (twelve) hours.  180 tablet  3  . lisinopril (PRINIVIL,ZESTRIL) 40 MG tablet Take 1 tablet (40 mg total) by mouth daily.  30 tablet  3  . LORazepam (ATIVAN) 1 MG tablet Take 1 mg by mouth daily. Take one tablet for seizure prevention.      . Oxycodone HCl 10 MG TABS Take 1 tablet  (10 mg total) by mouth every 6 (six) hours as needed (severe pain).  12 tablet  0  . pantoprazole (PROTONIX) 40 MG tablet Take 1 tablet (40 mg total) by mouth daily.  30 tablet  11  . phenytoin (DILANTIN) 50 MG tablet Chew 2 tablets (100 mg total) by mouth 3 (three) times daily.  180 tablet  3  . ritonavir (NORVIR) 100 MG TABS tablet Take 100 mg by mouth daily with breakfast.       Allergies: Allergies as of 07/05/2013 - Review Complete 07/05/2013  Allergen Reaction Noted  . Ceftriaxone  06/01/2013  . Norvasc [amlodipine besylate]  03/30/2013  . Morphine and related Hives, Itching, and Rash 02/15/2013   Past Medical History  Diagnosis Date  . Seizures   . Stroke   . Meningitis   . HIV (human immunodeficiency virus infection)   . Hypertension   . Gout   . Muscle spasms of head and/or neck   . CKD (chronic kidney disease)   . CHF (congestive heart failure)     [name] 06/18/2013  . HCV (hepatitis C virus)     chronic/notes 06/18/2013  . Type II diabetes mellitus     [name] 06/18/2013  . AIHA (autoimmune hemolytic anemia)     [name] 06/18/2013  . Hypertensive encephalopathy ~ 05/2013    hospitalaized/notes 06/18/2013  . Daily headache     "for the last 6 years/notes 06/18/2013  . Exertional shortness of breath     [name] 06/18/2013  . Anxiety     [name] 06/18/2013  . Nephrotic syndrome   . History of syphilis    Past Surgical History  Procedure Laterality Date  . Hip pinning Right    Family History  Problem Relation Age of Onset  . Cancer - Colon Mother   . Cancer Father   . Diabetes    . Hypertension Father    History   Social History  . Marital Status: Single    Spouse Name: N/A    Number of Children: 4  . Years of Education: 11th   Occupational History  . Not on file.   Social History Main Topics  . Smoking status: Former Smoker    Types: Cigarettes    Quit date: 06/19/2010  . Smokeless tobacco: Never Used  . Alcohol Use: No  . Drug Use: No     Comment:  past history of alcohol, cocaine and IV drug use  . Sexual Activity: Not Currently    Partners: Male     Comment: given condoms   Other Topics Concern  . Not on file   Social History Narrative   Patient lives at home with sister.    Patient is unemployed.    Patient is single.    Patient has 2 alive  and 2 deceased.    Patient has 11th grade education.           Review of Systems: Constitutional: Denies fever, chills, diaphoresis, appetite change and fatigue.  HEENT: Denies photophobia, eye pain, redness, hearing loss, ear pain, congestion, sore throat, rhinorrhea, sneezing, trouble swallowing, neck pain, neck stiffness and tinnitus.  Gastrointestinal: Denies nausea, vomiting, abdominal pain, diarrhea, constipation,blood in stool and abdominal distention. Appetite has been normal. Genitourinary: Reports dysuria, without hematuria. She also reports that her urine is dark. No vaginal discharge  Musculoskeletal: She reports, that she recently had a fall, which was not witnessed. Her legs gave way when she was trying to get up of her chair. She denies any injuries resulting from this fall.   Skin: Denies pallor, rash and wound.  Neurological: Denies dizziness, seizures, syncope, weakness, lightheadedness, numbness and headaches.  Hematological: Denies adenopathy. Easy bruising, personal or family bleeding history  Psychiatric/Behavioral: Denies suicidal ideation, mood changes, confusion, nervousness, sleep disturbance and agitation  Physical Exam: Filed Vitals:   07/05/13 1839  BP: 162/78  Pulse: 90  Temp: 98.4 F (36.9 C)  Resp: 18   General: well developed, well nourished; no acute distressed, cooperative with exam Head: atraumatic, normocephalic,  Eye: pupils equal, round and reactive; sclera anicteric; normal conjunctiva  Nose/throat: oropharynx clear, moist mucous membranes, pink gums  Neck: supple Lungs/Chest wall: clear to auscultation bilaterally, normal work of  breathing, coughs repeatedly during exam  Heart: normal rate and regular rhythm; no murmurs Pulses: radial are present. Difficult to appreciate pedal pulses due to edema Abdomen: Normal fullness, no rebound, guarding, or rigidity; normal bowel sounds; no masses or organomegaly  Skin: warm, dry, intact, normal turgor, no rashes  Extremities: Bilateral pitting edema to the level of the thighs. Serosanguineous fluid draining from the both legs (rt draining more than left). No increased erythema, or warmth of the legs. Right lower extremity is significantly larger than the left. Open superficial wounds (from scratching) over the shins bilaterally.  Motor strength is 5/5 in the all 4 extremities, Sensations intact to light touch, Psych: patient is alert and oriented, thought content is normal without delusions, thought process is linear, speech is normal and non-pressured, behavior is normal     Imaging results:  No results found.  Other results: EKG:  No EKG  Assessment & Plan by Problem: Principal Problem:   Acute on chronic renal failure Active Problems:   Diabetes   Pulmonary hypertension   Diastolic dysfunction-grade 2   HIV (human immunodeficiency virus infection)   Hypertension   CKD (chronic kidney disease)   Shortness of breath   HCV (hepatitis C virus)   Leg edema   Nephrotic syndrome  53 year old woman, with past medical history of dCHF (EF normal, with Grade-2 diastolic dysfunction), HIV (CD4 490 and VL<20 on 05/07/13), chronic HCV, seizure disorder, hx of stroke, HTN, T2DM, CKD, gout, and AIHA, who is admitted through the [facility] for inpatient diuretic therapy. Her outpatient diuretic therapy has been complicated with a raising creatinine and increasing bilateral lower extremity edema with fluid drainage from her legs.  # Acute on chronic kidney injury: In the setting of history of nephrotic syndrome with documented recent urinalysis showed >300 protein and Urine micro albumin/Cr  ratio of 4465 mg/g, hypoalbuminemia (2.2) and hypertriglyceridemia (246). Baseline creatinine is 1.5 to 1.7. On 07/02/2013 Cr 2.1 compared to her discharged with a creatinine level of 1.75 two weeks ago. This increase on creatinine could represent progression of her chronic renal  disease. Her weight has remained relatively stable with a 7 pound weight gain over the past 3 weeks.  Plan  - hold furosemide for now but we can restart if her renal function improves  - given her worsening renal failure and difficulties with fluid management, we will obtain a renal consult on this admission. - BMET - strict ins and outs  - renal diet  - repeat urinalysis with her repeated dysuria to rule out a UTI  # Bilateral lower extremity ; right > left: Other differentials including cellulitis is unlikely without fevers, chills, or erythema on physical exam. However, white blood cell count will be checked to rule out deep-seated infection. There is concern for DVT in the right leg which is can be a complication with nephrotic syndrome. If the Dopplers are unrevealing, further imaging with MRI will be considered. Again, this could all be due to just from fluid overload from her nephrotic syndrome. Prednisone can also contribute to fluid retention.  Plan  - check LE doppler for DVT. - manage with diuretics  - use heparin for DVT ppx in the setting of AKI   # Hypertension. Patient with significant history of high blood pressure, which have been difficult to control from previous admissions. Her home regimen includes furosemide 80 mg twice a day, labetaolol 100 mg twice a day, hydralazine, 50 mg 3 times daily, clonidine 0.2 mg twice daily, lisinopril 40 mg daily. Plan  - Will keep on her home regimen except for furosemide - if the blood pressure remains elevated we can consider nondihydropyridine calcium channel blocker such as Diltiazem or verpamil. These drugs may have a synergistic vasodilatory effect with ACE  inhibitors even though they can be associated with worsening LE edema.   # Diastolic CHF: 2-D echocardiogram on 05/31/2013 revealed normal. Systolic function, but with grade 2 diastolic dysfunction. Her current bilateral, lower extremity edema can be partly be due to congestive heart failure. We will continue with the diuretic therapy when able based on renal function.  # Diabetes: Well controlled with last A1c on 05/14/13 of 5.0%. Home regimen includes Lantus 30 units each bedtime with NovoLog insulin at mealtimes.  Plan. - Lantus 30 - sliding scale insulin. - give her low a1c, she might require a lower insulin dose   # HIV disease: Most recent HIV viral load 05/07/2013 was undetectable with a CD4 count of 490. Will continue with her home medications.   # Autoimmune hemolytic anemia: Patient was seen by Dr Juliann Mule of hematology on 06/27/2013. AHIA thought to be medication induced secondary to ceftriaxone, diagnosed during last admission. Hgb stable at baseline 8-9.  Plan  Per hematology she was treated with prednisone taper at 40 mg daily with plan to taper to 20 mg daily on 1/28 for 1 month and then re-evaluate with CBC as outpatient  Dispo: Disposition is deferred at this time, awaiting improvement of current medical problems. Anticipated discharge in approximately 2-3 day(s).   The patient does have a current PCP Clinton Gallant, MD) and does need an [facility] hospital follow-up appointment after discharge.  The patient does not have transportation limitations that hinder transportation to clinic appointments.  Signed:  Jessee Avers, MD PGY-2 Internal Medicine Teaching Service Pager: [phone] 07/05/2013, 8:49 PM

## 2013-07-05 NOTE — Progress Notes (Signed)
This encounter was created in error - please disregard.

## 2013-07-05 NOTE — Progress Notes (Signed)
Patient has arrived to unit. Patient alert and oriented and appears to be in no distress at this time. Patient has no complaints at this time. Patient placed on cardiac monitor and CCMD notified.

## 2013-07-06 DIAGNOSIS — R609 Edema, unspecified: Secondary | ICD-10-CM

## 2013-07-06 DIAGNOSIS — M7989 Other specified soft tissue disorders: Secondary | ICD-10-CM

## 2013-07-06 LAB — BASIC METABOLIC PANEL
BUN: 54 mg/dL — AB (ref 6–23)
CHLORIDE: 104 meq/L (ref 96–112)
CO2: 15 meq/L — AB (ref 19–32)
Calcium: 7.3 mg/dL — ABNORMAL LOW (ref 8.4–10.5)
Creatinine, Ser: 2.49 mg/dL — ABNORMAL HIGH (ref 0.50–1.10)
GFR calc Af Amer: 24 mL/min — ABNORMAL LOW (ref >90)
GFR calc non Af Amer: 21 mL/min — ABNORMAL LOW (ref >90)
GLUCOSE: 260 mg/dL — AB (ref 70–99)
Potassium: 4.6 mEq/L (ref 3.7–5.3)
Sodium: 136 mEq/L — ABNORMAL LOW (ref 137–147)

## 2013-07-06 LAB — LACTATE DEHYDROGENASE: LDH: 326 U/L — ABNORMAL HIGH (ref 94–250)

## 2013-07-06 LAB — RENAL FUNCTION PANEL
ALBUMIN: 1.4 g/dL — AB (ref 3.5–5.2)
BUN: 52 mg/dL — ABNORMAL HIGH (ref 6–23)
CO2: 17 mEq/L — ABNORMAL LOW (ref 19–32)
Calcium: 7.6 mg/dL — ABNORMAL LOW (ref 8.4–10.5)
Chloride: 102 mEq/L (ref 96–112)
Creatinine, Ser: 2.33 mg/dL — ABNORMAL HIGH (ref 0.50–1.10)
GFR, EST AFRICAN AMERICAN: 26 mL/min — AB (ref >90)
GFR, EST NON AFRICAN AMERICAN: 23 mL/min — AB (ref >90)
Glucose, Bld: 325 mg/dL — ABNORMAL HIGH (ref 70–99)
PHOSPHORUS: 3.2 mg/dL (ref 2.3–4.6)
POTASSIUM: 4.4 meq/L (ref 3.7–5.3)
SODIUM: 134 meq/L — AB (ref 137–147)

## 2013-07-06 LAB — BILIRUBIN, FRACTIONATED(TOT/DIR/INDIR)
BILIRUBIN TOTAL: 2.3 mg/dL — AB (ref 0.3–1.2)
Bilirubin, Direct: 2 mg/dL — ABNORMAL HIGH (ref 0.0–0.3)
Indirect Bilirubin: 0.3 mg/dL (ref 0.3–0.9)

## 2013-07-06 LAB — URINALYSIS, ROUTINE W REFLEX MICROSCOPIC
Glucose, UA: 100 mg/dL — AB
Ketones, ur: NEGATIVE mg/dL
LEUKOCYTES UA: NEGATIVE
NITRITE: NEGATIVE
Protein, ur: >300 mg/dL — AB
SPECIFIC GRAVITY, URINE: 1.016 (ref 1.005–1.030)
UROBILINOGEN UA: 1 mg/dL (ref 0.0–1.0)
pH: 5.5 (ref 5.0–8.0)

## 2013-07-06 LAB — CBC
HCT: 19.1 % — ABNORMAL LOW (ref 36.0–46.0)
HCT: 21.3 % — ABNORMAL LOW (ref 36.0–46.0)
HCT: 22 % — ABNORMAL LOW (ref 36.0–46.0)
HEMOGLOBIN: 6.3 g/dL — AB (ref 12.0–15.0)
Hemoglobin: 7.3 g/dL — ABNORMAL LOW (ref 12.0–15.0)
Hemoglobin: 7.6 g/dL — ABNORMAL LOW (ref 12.0–15.0)
MCH: 33.7 pg (ref 26.0–34.0)
MCH: 34.7 pg — ABNORMAL HIGH (ref 26.0–34.0)
MCH: 34.9 pg — AB (ref 26.0–34.0)
MCHC: 33 g/dL (ref 30.0–36.0)
MCHC: 34.3 g/dL (ref 30.0–36.0)
MCHC: 34.5 g/dL (ref 30.0–36.0)
MCV: 100.5 fL — ABNORMAL HIGH (ref 78.0–100.0)
MCV: 101.9 fL — ABNORMAL HIGH (ref 78.0–100.0)
MCV: 102.1 fL — ABNORMAL HIGH (ref 78.0–100.0)
PLATELETS: 180 10*3/uL (ref 150–400)
PLATELETS: 174 10*3/uL (ref 150–400)
Platelets: 176 10*3/uL (ref 150–400)
RBC: 1.87 MIL/uL — ABNORMAL LOW (ref 3.87–5.11)
RBC: 2.09 MIL/uL — ABNORMAL LOW (ref 3.87–5.11)
RBC: 2.19 MIL/uL — ABNORMAL LOW (ref 3.87–5.11)
RDW: 18 % — ABNORMAL HIGH (ref 11.5–15.5)
RDW: 16.8 % — AB (ref 11.5–15.5)
RDW: 17 % — AB (ref 11.5–15.5)
WBC: 6 10*3/uL (ref 4.0–10.5)
WBC: 6.4 10*3/uL (ref 4.0–10.5)
WBC: 7.3 10*3/uL (ref 4.0–10.5)

## 2013-07-06 LAB — TROPONIN I

## 2013-07-06 LAB — GLUCOSE, CAPILLARY
GLUCOSE-CAPILLARY: 287 mg/dL — AB (ref 70–99)
GLUCOSE-CAPILLARY: 339 mg/dL — AB (ref 70–99)
Glucose-Capillary: 258 mg/dL — ABNORMAL HIGH (ref 70–99)
Glucose-Capillary: 286 mg/dL — ABNORMAL HIGH (ref 70–99)

## 2013-07-06 LAB — RETICULOCYTES
RBC.: 2.09 MIL/uL — ABNORMAL LOW (ref 3.87–5.11)
Retic Count, Absolute: 39.7 10*3/uL (ref 19.0–186.0)
Retic Ct Pct: 1.9 % (ref 0.4–3.1)

## 2013-07-06 LAB — PREPARE RBC (CROSSMATCH)

## 2013-07-06 LAB — DIRECT ANTIGLOBULIN TEST (NOT AT ARMC)
DAT, IgG: POSITIVE
DAT, complement: POSITIVE

## 2013-07-06 LAB — URINE MICROSCOPIC-ADD ON

## 2013-07-06 LAB — HAPTOGLOBIN: Haptoglobin: 208 mg/dL — ABNORMAL HIGH (ref 45–215)

## 2013-07-06 LAB — ALBUMIN: Albumin: 1.4 g/dL — ABNORMAL LOW (ref 3.5–5.2)

## 2013-07-06 LAB — PHENYTOIN LEVEL, TOTAL: Phenytoin Lvl: 2.6 ug/mL — ABNORMAL LOW (ref 10.0–20.0)

## 2013-07-06 MED ORDER — PREDNISONE 20 MG PO TABS
40.0000 mg | ORAL_TABLET | Freq: Every day | ORAL | Status: DC
  Administered 2013-07-06 – 2013-07-10 (×5): 40 mg via ORAL
  Filled 2013-07-06 (×6): qty 2

## 2013-07-06 MED ORDER — LISINOPRIL 40 MG PO TABS
40.0000 mg | ORAL_TABLET | Freq: Every day | ORAL | Status: DC
  Filled 2013-07-06: qty 1

## 2013-07-06 MED ORDER — SODIUM CHLORIDE 0.9 % IV BOLUS (SEPSIS): 500.0000 mL | Freq: Once | INTRAVENOUS | Status: DC

## 2013-07-06 MED ORDER — FUROSEMIDE 10 MG/ML IJ SOLN
80.0000 mg | Freq: Three times a day (TID) | INTRAMUSCULAR | Status: DC
  Administered 2013-07-06 – 2013-07-08 (×6): 80 mg via INTRAVENOUS
  Filled 2013-07-06 (×8): qty 8

## 2013-07-06 MED ORDER — INSULIN GLARGINE 100 UNIT/ML ~~LOC~~ SOLN
40.0000 [IU] | Freq: Every day | SUBCUTANEOUS | Status: DC
  Administered 2013-07-06: 40 [IU] via SUBCUTANEOUS
  Filled 2013-07-06: qty 0.4

## 2013-07-06 MED ORDER — ACETAMINOPHEN 325 MG PO TABS
650.0000 mg | ORAL_TABLET | Freq: Once | ORAL | Status: AC
  Administered 2013-07-06: 650 mg via ORAL
  Filled 2013-07-06: qty 2

## 2013-07-06 MED ORDER — LAMIVUDINE 10 MG/ML PO SOLN
100.0000 mg | Freq: Every day | ORAL | Status: DC
  Filled 2013-07-06: qty 10

## 2013-07-06 MED ORDER — LISINOPRIL 10 MG PO TABS
10.0000 mg | ORAL_TABLET | Freq: Every day | ORAL | Status: DC
  Administered 2013-07-07 – 2013-07-08 (×2): 10 mg via ORAL
  Filled 2013-07-06 (×2): qty 1

## 2013-07-06 MED ORDER — OXYCODONE HCL 5 MG PO TABS
10.0000 mg | ORAL_TABLET | Freq: Once | ORAL | Status: AC
  Administered 2013-07-06: 10 mg via ORAL
  Filled 2013-07-06: qty 2

## 2013-07-06 NOTE — Consult Note (Signed)
WOC wound consult note Reason for Consult:Bilateral LEs with edema and excoriation (scratching due to drying and stretching of tissue) Wound type: trauma (scratching) resulting in partial thickness wounds on RLE and a full thickness wound on the LLE Pressure Ulcer POA: No Measurement: RLE:  Two linear partial thickness wound measuring 6cm x 0.4cm x 0.2cm (proximal) and 4cm x 0.4cm x 0.2cm distal.  LLE:  Full thickness wound measuring 3cm x 2cm x 0.4cm. Wound bed: Red, moist Drainage (amount, consistency, odor) serous, moderate amount Periwound:intact, dry, edematous Dressing procedure/placement/frequency:i will implement topical wound care to include daily cleansing, application of an antimicrobial impregnated gauze (xeroform), and light compression (via ACE wraps) to be used in conjunction with elevation at this time.  Noted were vascular lab findings that there does not appear to be a DVT or  Baker's cyst, however I do not feel that we should begin greater compression at this time.  If you agree, patient could be referred to the outpatient wound care center at [facility] hospital for consideration of compression wraps and a recommended change schedule after the tissue trauma from scratching is accomplished. Often injuries from human nails present with local response to bacteria. At this time, patient will benefit from both daily observation and mild compression. WOC nursing team will not follow, but will remain available to this patient, the nursing and medical team.  Please re-consult if needed. Thanks, Ladona Mow, MSN, RN, GNP, [place], [facility]AP [phone])

## 2013-07-06 NOTE — H&P (Signed)
  Date: 07/06/2013  Patient name: Michelle [name]  Medical record number: [id number]  Date of birth: 1961/03/14   I have seen and evaluated Michelle [name] and discussed their care with the Residency Team.   Assessment and Plan: I have seen and evaluated the patient as outlined above. I agree with the formulated Assessment and Plan as detailed in the residents' admission note, with the following changes:   On exam she is nontoxic appearing and with profound painful LE edema with skin breakdown but no obvious infection.   Complicated 53 year old with CHF, HTN, DM, CKD, AIHA, HIV (perfectly controlled) and Hep C coinfection admitted in context of worsening renal failure and lower extremity edema.  It had been my understanding on rounds that her urine sodium spot had been low and FENA <1 but I cannnot locate urine lytes in EPic. This information made me concerned for pre-renal failure and therefore we held her diuretics and her ACEI  She suffers from Nephrotic range proteinuria as well   We will get Dopplers to exclude DVT  We will hold her diuretics for now. She has worsened anemia and we are are checking labs for hemolysis. Dr. Manson Passey stopped her hydralazine in case this was culprit and we are giving higher dose of steroids. We will give blood transfusion keeping in mind risk for further hemolysis  If she is not with much of pre-renal picture, I would re-initiate aggressive IV diuretics and restart her ACEI  Her HIV is perfectly controlled.   I think she is in need of treatment for her Hep C which we can do via our clinic in [place] either directly (if Medicaid will pay for all oral regimen ) or via ACTG trial (though she would have to switch to Isentress based regimen likely for current trial that is to open shortly  We are getting VVS to rule    Randall Hiss, MD 2/7/20151:25 PM

## 2013-07-06 NOTE — Consult Note (Signed)
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  SCr trend below.  Pt states has had bubbly/sudsy urine for 2 wks, 1 wk of painful LEE with weeping, puffy face.  Denies hematuria, tea colored urine, F/C, NS, unusual arthralgias, hemoptysis, epistaxis, sudden hearing changes/tinnitus, unusual rashes.  4+ proteinuria present on UA as far back as 01/2013.  Pt w/ hx/o nl LVEF, mild LVH and grade 2 Diastolic disease.    A discharge note from 06/21/13 noted the above symptoms and an outpt referral to [facility] to be made.  She has not been seen in our clinic.    During admission pt with falling hemoglobin, being transfused now.  No clear evidence of recurrent hemolysis.    Creat (mg/dL)  Date Value  06/28/2013 2.00*  06/26/2013 2.13*  06/18/2013 1.95*  06/11/2013 2.02*  05/21/2013 1.47*  05/07/2013 2.36*     Creatinine, Ser (mg/dL)  Date Value  07/06/2013 2.49*  07/05/2013 2.28*  07/02/2013 2.10*  06/20/2013 1.75*  06/19/2013 1.73*  06/19/2013 1.78*  06/18/2013 1.95*  06/02/2013 0.90   06/01/2013 1.98*  05/31/2013 2.25*  ] I/Os: I/O last 3 completed shifts: In: 600 [P.O.:600] Out: 950 [Urine:950]  ROS IV Contrast none TMP/SMX none Hypotension none Balance of 12 systems is negative w/ exceptions as above  PMH  Past Medical History  Diagnosis Date  . Seizures   . Stroke   . Meningitis   . HIV (human immunodeficiency virus infection)   . Hypertension   . Gout   . Muscle spasms of head and/or neck   . CKD (chronic kidney disease)   . CHF (congestive heart failure)     [name] 06/18/2013  . HCV (hepatitis C virus)     chronic/notes 06/18/2013  . Type II diabetes mellitus     [name]  06/18/2013  . AIHA (autoimmune hemolytic anemia)     [name] 06/18/2013  . Hypertensive encephalopathy ~ 05/2013    hospitalaized/notes 06/18/2013  . Daily headache     "for the last 6 years/notes 06/18/2013  . Exertional shortness of breath     [name] 06/18/2013  . Anxiety     [name] 06/18/2013  . Nephrotic syndrome   . History of syphilis   . High cholesterol    PSH  Past Surgical History  Procedure Laterality Date  . Hip pinning Right    FH  Family History  Problem Relation Age of Onset  . Cancer - Colon Mother   . Cancer Father   . Diabetes    . Hypertension Father    SH  reports that she quit smoking about 3 years ago. Her smoking use included Cigarettes. She smoked 0.00 packs per day. She has never used smokeless tobacco. She reports that she does not drink alcohol or use illicit drugs. Allergies  Allergies  Allergen Reactions  . 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 Norvasc [Amlodipine Besylate]     Itching, rash , hives .   [name] Morphine And Related Hives, Itching and Rash   Home medications Prior to Admission medications   Medication Sig Start Date End Date Taking? Authorizing Provider  abacavir (ZIAGEN) 300 MG tablet Take 2  tablets (600 mg total) by mouth daily. 06/20/13  Yes Lesly Dukes, MD  acyclovir (ZOVIRAX) 800 MG tablet Take 1 tablet (800 mg total) by mouth daily. 06/02/13  Yes Lesly Dukes, MD  Blood Glucose Monitoring Suppl (ACCU-CHEK AVIVA PLUS) W/DEVICE KIT USE AS DIRECTED 05/24/13  Yes Dominic Pea, DO  cholecalciferol (VITAMIN D) 1000 UNITS tablet Take 1,000 Units by mouth daily.   Yes Historical Provider, MD  cloNIDine (CATAPRES) 0.2 MG tablet Take 1 tablet (0.2 mg total) by mouth 2 (two) times daily. 06/02/13  Yes Lesly Dukes, MD  Darunavir Ethanolate (PREZISTA) 800 MG tablet Take 1 tablet (800 mg total) by mouth daily with breakfast. 06/20/13  Yes Lesly Dukes, MD  furosemide (LASIX) 40 MG tablet Take 80 mg by  mouth 2 (two) times daily.   Yes Historical Provider, MD  gentamicin (GARAMYCIN) 0.3 % ophthalmic ointment Place 1 application into both eyes 4 (four) times daily.   Yes Historical Provider, MD  glucose blood (ACCU-CHEK AVIVA) test strip Use as instructed 06/13/13  Yes Clinton Gallant, MD  hydrALAZINE (APRESOLINE) 50 MG tablet Take 50 mg by mouth 3 (three) times daily.   Yes Historical Provider, MD  insulin aspart (NOVOLOG) 100 UNIT/ML injection Inject into the skin 3 (three) times daily before meals. Home Sliding scale   Yes Historical Provider, MD  insulin glargine (LANTUS) 100 UNIT/ML injection Inject 30 Units into the skin at bedtime.   Yes Historical Provider, MD  labetalol (NORMODYNE) 100 MG tablet Take 1 tablet (100 mg total) by mouth 2 (two) times daily. 06/02/13  Yes Lesly Dukes, MD  lamiVUDine (EPIVIR) 150 MG tablet Take 1 tablet (150 mg total) by mouth daily. 06/20/13  Yes Lesly Dukes, MD  levETIRAcetam (KEPPRA) 500 MG tablet Take 3 tablets (1,500 mg total) by mouth every 12 (twelve) hours. 06/02/13  Yes Lesly Dukes, MD  lisinopril (PRINIVIL,ZESTRIL) 40 MG tablet Take 1 tablet (40 mg total) by mouth daily. 06/02/13  Yes Lesly Dukes, MD  LORazepam (ATIVAN) 1 MG tablet Take 1 mg by mouth daily. Take one tablet for seizure prevention.   Yes Historical Provider, MD  methocarbamol (ROBAXIN) 500 MG tablet Take 500 mg by mouth every 6 (six) hours as needed for muscle spasms.   Yes Historical Provider, MD  Oxycodone HCl 10 MG TABS Take 1 tablet (10 mg total) by mouth every 6 (six) hours as needed (severe pain). 07/02/13  Yes Kaitlyn Szekalski, PA-C  pantoprazole (PROTONIX) 40 MG tablet Take 1 tablet (40 mg total) by mouth daily. 06/02/13  Yes Lesly Dukes, MD  phenytoin (DILANTIN) 50 MG tablet Chew 2 tablets (100 mg total) by mouth 3 (three) times daily. 06/02/13  Yes Lesly Dukes, MD  ritonavir (NORVIR) 100 MG TABS tablet Take 100 mg by mouth daily with breakfast.   Yes Historical Provider, MD  aspirin EC 81 MG  tablet Take 1 tablet (81 mg total) by mouth daily. 06/02/13   Lesly Dukes, MD  predniSONE (DELTASONE) 20 MG tablet Take 20 mg by mouth daily with breakfast.    Historical Provider, MD    Current Medications Current Facility-Administered Medications  Medication Dose Route Frequency Provider Last Rate Last Dose  . abacavir (ZIAGEN) tablet 600 mg  600 mg Oral Daily Jessee Avers, MD   600 mg at 07/06/13 1218  . acyclovir (ZOVIRAX) tablet 800 mg  800 mg Oral Daily Jessee Avers, MD   800 mg at 07/06/13 1218  . aspirin EC tablet 81 mg  81 mg Oral Daily Richard  Alice Rieger, MD   81 mg at 07/06/13 1218  . cloNIDine (CATAPRES) tablet 0.2 mg  0.2 mg Oral BID Jessee Avers, MD   0.2 mg at 07/06/13 1219  . Darunavir Ethanolate (PREZISTA) tablet 800 mg  800 mg Oral Q breakfast Jessee Avers, MD   800 mg at 07/06/13 0645  . enoxaparin (LOVENOX) injection 40 mg  40 mg Subcutaneous Q24H Jessee Avers, MD   40 mg at 07/06/13 0140  . insulin aspart (novoLOG) injection 0-5 Units  0-5 Units Subcutaneous QHS Hester Mates, MD   4 Units at 07/06/13 0141  . insulin aspart (novoLOG) injection 0-9 Units  0-9 Units Subcutaneous TID WC Hester Mates, MD   5 Units at 07/06/13 1234  . insulin glargine (LANTUS) injection 40 Units  40 Units Subcutaneous QHS Joni Reining, DO      . labetalol (NORMODYNE) tablet 100 mg  100 mg Oral BID Jessee Avers, MD   100 mg at 07/06/13 1218  . lamiVUDine (EPIVIR) tablet 150 mg  150 mg Oral Daily Jessee Avers, MD   150 mg at 07/06/13 1219  . levETIRAcetam (KEPPRA) tablet 1,500 mg  1,500 mg Oral Q12H Jessee Avers, MD   1,500 mg at 07/06/13 1219  . lisinopril (PRINIVIL,ZESTRIL) tablet 40 mg  40 mg Oral Daily Jessee Avers, MD      . pantoprazole (PROTONIX) EC tablet 40 mg  40 mg Oral Daily Jessee Avers, MD   40 mg at 07/06/13 1000  . phenytoin (DILANTIN) chewable tablet 100 mg  100 mg Oral TID Jessee Avers, MD   100 mg at 07/06/13 1220  . ritonavir (NORVIR) tablet 100 mg   100 mg Oral Q breakfast Jessee Avers, MD   100 mg at 07/06/13 0645    CBC  Recent Labs Lab 07/02/13 1346 07/05/13 2110 07/05/13 2354 07/06/13 0523  WBC 6.6 7.7 7.3 6.0  NEUTROABS 5.1 5.9  --   --   HGB 8.6* 6.9* 7.3* 6.3*  HCT 25.2* 19.8* 21.3* 19.1*  MCV 101.2* 101.5* 101.9* 102.1*  PLT 204 181 176 619   Basic Metabolic Panel  Recent Labs Lab 07/02/13 1346 07/05/13 2110 07/06/13 0525  NA 137 136* 136*  K 4.0 3.6* 4.6  CL 104 101 104  CO2 16* 16* 15*  GLUCOSE 188* 289* 260*  BUN 62* 55* 54*  CREATININE 2.10* 2.28* 2.49*  CALCIUM 8.1* 7.8* 7.3*    Physical Exam  Blood pressure 132/67, pulse 83, temperature 98.7 F (37.1 C), temperature source Oral, resp. rate 20, height 5' 5.5" (1.664 m), weight 90 kg (198 lb 6.6 oz), SpO2 98.00%. GEN: NAD, groggy ENT: poor dentition, b/l periorbital fullness EYES: EOMI CV: RRR, nl s1s2. 2/6 MSM at apex PULM: CTAB. Nl wob ABD: obese, s/nt/nd SKIN: some oozing across shins EXT:4+ LEE into thighs and dependently   Assessment 22F w nephrotic syndrome, AKI, in setting of HCV + VL, +ANA, well controlled HIV, and hx/o AIHA w/ anemia currenlty.  Has hx/o +RPR and TPA.  Very broad differential for nephrotic syndrome currently.    1. Nephrotic Syndrome, anasarca, hypoalbuminemia.   2. HIV, well controlled 3. Positive ANA 1:160 4. HCV, + viremia 5. Hx/o drug induced AIHA on prednisone 6. DM2 7. Anemia, normal platelets and coags.  Normal haptoglobin.  Direct bilirubinemia.  Borderline macrocytic  PLAN 1. Restart diuretics at lasix 80 IV TID 2. Low Na Diet 3. Reduce lisinopril to 85m daily 4. C3, C4 5. Rheumatoid Factor 6. UP/C 7. ANCA 8.  SPEP and serum free light chains 9. Likely will need a renal biospy to sort this out.  Will readdress once serologies return 10. No NSAIDs or IV contrast  Pearson Grippe MD 07/06/2013, 2:20 PM

## 2013-07-06 NOTE — Progress Notes (Addendum)
Subjective: Patient reports she is feeling a little better today, is concerned about wounds on lower legs.  Denies SOB at this time, does feel a little weak. Objective: Vital signs in last 24 hours: Filed Vitals:   07/05/13 2155 07/06/13 0121 07/06/13 0433 07/06/13 0808  BP: 138/62 159/71 131/69   Pulse: 110 109 99   Temp: 99.5 F (37.5 C) 100.5 F (38.1 C) 99.1 F (37.3 C)   TempSrc: Oral Oral Oral   Resp: 18 16 18    Height:      Weight:   205 lb 7.5 oz (93.2 kg) 198 lb 6.6 oz (90 kg)  SpO2: 100% 99% 99%    Weight change:   Intake/Output Summary (Last 24 hours) at 07/06/13 1156 Last data filed at 07/06/13 0902  Gross per 24 hour  Intake    840 ml  Output    950 ml  Net   -110 ml   General: resting in bed HEENT: EOMI, no scleral icterus Cardiac: RRR, no rubs, murmurs or gallops Pulm: clear to auscultation bilaterally, moving normal volumes of air Abd: soft, nontender, nondistended, BS present Ext: 3+ pitting edema to knee b/l, 3x2cm anterior purulent ulcer on RLE, 5x1 cm and 4x.5cm purulent ulcers on LLE.  RLE more swollen than left. Neuro: alert and oriented X4  Lab Results: Basic Metabolic Panel:  Recent Labs Lab 07/02/13 1346 07/05/13 2110  NA 137 136*  K 4.0 3.6*  CL 104 101  CO2 16* 16*  GLUCOSE 188* 289*  BUN 62* 55*  CREATININE 2.10* 2.28*  CALCIUM 8.1* 7.8*   Liver Function Tests:  Recent Labs Lab 07/05/13 2110 07/05/13 2354  AST 40*  --   ALT 47*  --   ALKPHOS 249*  --   BILITOT 2.5* 2.3*  PROT 5.8*  --   ALBUMIN 1.5*  --    CBC:  Recent Labs Lab 07/02/13 1346 07/05/13 2110 07/05/13 2354 07/06/13 0523  WBC 6.6 7.7 7.3 6.0  NEUTROABS 5.1 5.9  --   --   HGB 8.6* 6.9* 7.3* 6.3*  HCT 25.2* 19.8* 21.3* 19.1*  MCV 101.2* 101.5* 101.9* 102.1*  PLT 204 181 176 180   Cardiac Enzymes:  Recent Labs Lab 07/05/13 2110 07/05/13 2359 07/06/13 0525  TROPONINI <0.30 <0.30 <0.30   BNP:  Recent Labs Lab 07/02/13 1347  PROBNP  294.3*   CBG:  Recent Labs Lab 07/02/13 1713 07/05/13 1559 07/05/13 2152 07/06/13 0631 07/06/13 1129  GLUCAP 273* 251* 321* 258* 286*   Coagulation:  Recent Labs Lab 07/05/13 2110  LABPROT 11.7  INR 0.87   Anemia Panel:  Recent Labs Lab 07/05/13 2354  RETICCTPCT 1.9   Urine Drug Screen: Drugs of Abuse     Component Value Date/Time   LABOPIA POSITIVE* 05/28/2013 1737   COCAINSCRNUR NONE DETECTED 05/28/2013 1737   LABBENZ NONE DETECTED 05/28/2013 1737   AMPHETMU NONE DETECTED 05/28/2013 1737   THCU NONE DETECTED 05/28/2013 1737   LABBARB NONE DETECTED 05/28/2013 1737    Alcohol Level: No results found for this basename: ETH,  in the last 168 hours Urinalysis:  Recent Labs Lab 07/02/13 1712 07/05/13 2352  COLORURINE YELLOW YELLOW  LABSPEC 1.011 1.016  PHURINE 5.5 5.5  GLUCOSEU NEGATIVE 100*  HGBUR TRACE* TRACE*  BILIRUBINUR NEGATIVE SMALL*  KETONESUR NEGATIVE NEGATIVE  PROTEINUR 100* >300*  UROBILINOGEN 0.2 1.0  NITRITE NEGATIVE NEGATIVE  LEUKOCYTESUR NEGATIVE NEGATIVE    Micro Results: Recent Results (from the past 240 hour(s))  URINE CULTURE  Status: None   Collection Time    07/02/13  5:12 PM      Result Value Range Status   Specimen Description URINE, CLEAN CATCH   Final   Special Requests Immunocompromised   Final   Culture  Setup Time     Final   Value: 07/02/2013 19:28     Performed at [facility]   Colony Count     Final   Value: 40,000 COLONIES/ML     Performed at [facility]   Culture     Final   Value: Multiple bacterial morphotypes present, none predominant. Suggest appropriate recollection if clinically indicated.     Performed at [facility]   Report Status 07/03/2013 FINAL   Final   Studies/Results: Dg Chest 2 View  07/05/2013   CLINICAL DATA:  53 year old female chest pain and cough  EXAM: CHEST  2 VIEW  COMPARISON:  DG CHEST 2 VIEW dated 07/02/2013; DG CHEST 1V PORT dated 05/10/2013; DG CHEST 2  VIEW dated 06/18/2013  FINDINGS: The cardiomediastinal silhouette is unremarkable.  Mild peribronchial thickening again noted.  There is no evidence of focal airspace disease, pulmonary edema, suspicious pulmonary nodule/mass, pleural effusion, or pneumothorax. No acute bony abnormalities are identified.  IMPRESSION: No active cardiopulmonary disease.   Electronically Signed   By: Laveda AbbeJeff  Hu M.D.   On: 07/05/2013 21:38   Medications: I have reviewed the patient's current medications. Scheduled Meds: . abacavir  600 mg Oral Daily  . acyclovir  800 mg Oral Daily  . aspirin EC  81 mg Oral Daily  . cloNIDine  0.2 mg Oral BID  . Darunavir Ethanolate  800 mg Oral Q breakfast  . enoxaparin (LOVENOX) injection  40 mg Subcutaneous Q24H  . insulin aspart  0-5 Units Subcutaneous QHS  . insulin aspart  0-9 Units Subcutaneous TID WC  . insulin glargine  30 Units Subcutaneous QHS  . labetalol  100 mg Oral BID  . lamiVUDine  150 mg Oral Daily  . levETIRAcetam  1,500 mg Oral Q12H  . pantoprazole  40 mg Oral Daily  . phenytoin  100 mg Oral TID  . ritonavir  100 mg Oral Q breakfast   Continuous Infusions:  PRN Meds:. Assessment/Plan:    Acute on Chronic Renal Failure vs Progression of CKD with Nephrotic syndrome  -Restart Lisinopril 40mg  (minimal AKI and this will help proteinuria) -Renal consult appreciate recs - Strict I&O as may need to diurese  -Patient ANA+ 1:160 (05/28/13), HepC+ (05/21/13), RPR reactive 1:1 (05/07/13), can consider adding Cryoglobulin, UPEP, Serum light chains, C3, C4, and total compliment. However patient already has multiple risk factors for nephrotic syndrome may need renal biopsy.   Chronic Heart Failure with Preserved EF. -Last echo 05/31/13, grade 2 DD.  Patient with lower extremity edema- likely due to nephrotic syndrome, does not appear intravascularly overloaded.  HTN- uncontrolled -Restart ACEi - Continue clonidine, labetalol  Autoimmune hemolytic anemia: Patient was  seen by Dr Rosie Fatehism of hematology on -06/27/2013. AHIA thought to be medication induced secondary to ceftriaxone, diagnosed during last admission.  -Hgb dropped this AM to 6.3, will give 1 unit of PRBC and recheck. - Labs last night labs +ive for warm autoantibody, haptoglobin 208, direct bili 2.0, LDH 326, G6PD pending - Hold Hydralazine - ?Could be due to HIV - May need to get Hematology consulted again. Especially if continues to hemolyze. -Restart Prednisone 40mg  daily  B/L leg swelling R>L. - Lower extremity Doppler, no evidence of DVT.  Diabetes - Increase Lantus to 40 units -SSI-S    HIV (human immunodeficiency virus infection) - Well controlled continue home medications    HCV (hepatitis C virus) - May be candidate for Tx at [facility]  Diet Carb Mod Code Status: Full DVT PPx: Lovenox  Dispo: Disposition is deferred at this time, awaiting improvement of current medical problems.  Anticipated discharge in approximately 2 day(s).   The patient does have a current PCP Christen Bame, MD) and does need an [facility] hospital follow-up appointment after discharge.  The patient does not have transportation limitations that hinder transportation to clinic appointments.  .Services Needed at time of discharge: Y = Yes, Blank = No PT:   OT:   RN:   Equipment:   Other:     LOS: 1 day   Carlynn Purl, DO 07/06/2013, 11:56 AM

## 2013-07-06 NOTE — Progress Notes (Signed)
Pt. Complained that her IV site was burning after I flushed pt.'s IV following Lasix administration. I noticed pt.'s IV site was turning red & puffy. I disconnected pt. From IV tubing immediately and removed IV. New IV was started on pt. With good blood return by another RN on the floor and the new IV flushed well with no complaints from pt. No other signs or symptoms of distress or discomfort. Pt. Denied any pain.

## 2013-07-06 NOTE — Progress Notes (Signed)
1 Unit of Blood was administered to pt. With no complications or signs or symptoms of a transfusion reaction. Blood consent was signed prior to blood administration and consent in chart as well as blood administration paper signed by myself and verifying RN. I did vital sign checks after 15 mins of blood administration and Q1 hrs with no complications noted from transfusion reaction. Charge nurse and another RN on floor verified transfusion had no signs of complications during the blood administration. Blood administration completed at 5:15 PM. I disconnected pt. From blood tubing and flushed and Saline Locked pt.

## 2013-07-06 NOTE — Progress Notes (Signed)
Informed MD of temp 100.5.  No orders at this time. Pt resting comfortably.  Barrie Lyme RN 1:33 AM 07/06/2013

## 2013-07-06 NOTE — Progress Notes (Signed)
Bilateral lower extremity venous duplex:  No obvious evidence of DVT, superficial thrombosis, or Baker's Cyst.  Technically difficult study due to the patient's body habitus and guarding secondary to the pain.   

## 2013-07-06 NOTE — Progress Notes (Signed)
Nutrition Brief Note  Patient identified on the Malnutrition Screening Tool (MST) Report  Wt Readings from Last 15 Encounters:  07/06/13 198 lb 6.6 oz (90 kg)  07/05/13 203 lb 1.6 oz (92.126 kg)  07/02/13 203 lb (92.08 kg)  07/01/13 208 lb 6.4 oz (94.53 kg)  06/27/13 207 lb 14.4 oz (94.303 kg)  06/26/13 209 lb 8 oz (95.029 kg)  06/20/13 198 lb 3.1 oz (89.9 kg)  06/18/13 212 lb 1.6 oz (96.208 kg)  06/11/13 204 lb 14.4 oz (92.942 kg)  06/02/13 183 lb 3.2 oz (83.099 kg)  05/21/13 198 lb 8 oz (90.039 kg)  05/20/13 199 lb 12.8 oz (90.629 kg)  05/16/13 176 lb 11.2 oz (80.151 kg)  03/30/13 187 lb (84.823 kg)  03/04/13 182 lb (82.555 kg)    Body mass index is 32.5 kg/(m^2). Patient meets criteria for Obesity based on current BMI.   Current diet order is Carb Modified, patient is consuming approximately 85-100% of meals at this time. Pt meeting with MD at time of visit. No recent weight loss per weight history. Labs and medications reviewed.   No nutrition interventions warranted at this time. If nutrition issues arise, please consult RD.   Ian Malkin RD, LDN Inpatient Clinical Dietitian Pager: [phone] After Hours Pager: [phone]

## 2013-07-06 NOTE — Progress Notes (Signed)
CRITICAL VALUE ALERT  Critical value received:  Hgb 6.3  Date of notification:  07/06/13  Time of notification:  0648  Critical value read back:yes  Nurse who received alert:  Albesa Seen RN  MD notified (1st page):  Teaching Service On Call  Time of first page:  [phone]  MD notified (2nd page): Teaching Service Rhea Pink  Time of second page: [phone]  MD Notified (3rd page): Dr. Harmon Dun  Time of third page: [phone]  Responding MD:  Dr. Harmon Dun  Time MD responded:  0818  Barrie Lyme RN 8:18 AM 07/06/2013

## 2013-07-07 LAB — COMPREHENSIVE METABOLIC PANEL
ALBUMIN: 1.6 g/dL — AB (ref 3.5–5.2)
ALT: 42 U/L — AB (ref 0–35)
AST: 34 U/L (ref 0–37)
Alkaline Phosphatase: 336 U/L — ABNORMAL HIGH (ref 39–117)
BUN: 60 mg/dL — ABNORMAL HIGH (ref 6–23)
CHLORIDE: 97 meq/L (ref 96–112)
CO2: 21 meq/L (ref 19–32)
CREATININE: 2.23 mg/dL — AB (ref 0.50–1.10)
Calcium: 8 mg/dL — ABNORMAL LOW (ref 8.4–10.5)
GFR calc Af Amer: 28 mL/min — ABNORMAL LOW (ref >90)
GFR, EST NON AFRICAN AMERICAN: 24 mL/min — AB (ref >90)
Glucose, Bld: 371 mg/dL — ABNORMAL HIGH (ref 70–99)
Potassium: 4.2 mEq/L (ref 3.7–5.3)
SODIUM: 134 meq/L — AB (ref 137–147)
Total Bilirubin: 1.3 mg/dL — ABNORMAL HIGH (ref 0.3–1.2)
Total Protein: 6.1 g/dL (ref 6.0–8.3)

## 2013-07-07 LAB — CBC
HEMATOCRIT: 25 % — AB (ref 36.0–46.0)
Hemoglobin: 8.4 g/dL — ABNORMAL LOW (ref 12.0–15.0)
MCH: 33.1 pg (ref 26.0–34.0)
MCHC: 33.6 g/dL (ref 30.0–36.0)
MCV: 98.4 fL (ref 78.0–100.0)
PLATELETS: 199 10*3/uL (ref 150–400)
RBC: 2.54 MIL/uL — ABNORMAL LOW (ref 3.87–5.11)
RDW: 17.6 % — AB (ref 11.5–15.5)
WBC: 6.3 10*3/uL (ref 4.0–10.5)

## 2013-07-07 LAB — GLUCOSE, CAPILLARY
GLUCOSE-CAPILLARY: 508 mg/dL — AB (ref 70–99)
GLUCOSE-CAPILLARY: 478 mg/dL — AB (ref 70–99)
Glucose-Capillary: 448 mg/dL — ABNORMAL HIGH (ref 70–99)
Glucose-Capillary: 467 mg/dL — ABNORMAL HIGH (ref 70–99)
Glucose-Capillary: 462 mg/dL — ABNORMAL HIGH (ref 70–99)
Glucose-Capillary: 369 mg/dL — ABNORMAL HIGH (ref 70–99)

## 2013-07-07 LAB — RENAL FUNCTION PANEL
ALBUMIN: 1.6 g/dL — AB (ref 3.5–5.2)
ALBUMIN: 1.5 g/dL — AB (ref 3.5–5.2)
BUN: 56 mg/dL — AB (ref 6–23)
BUN: 59 mg/dL — AB (ref 6–23)
CALCIUM: 8 mg/dL — AB (ref 8.4–10.5)
CHLORIDE: 91 meq/L — AB (ref 96–112)
CO2: 17 mEq/L — ABNORMAL LOW (ref 19–32)
CO2: 17 mEq/L — ABNORMAL LOW (ref 19–32)
CREATININE: 2.21 mg/dL — AB (ref 0.50–1.10)
CREATININE: 2.21 mg/dL — AB (ref 0.50–1.10)
Calcium: 7.9 mg/dL — ABNORMAL LOW (ref 8.4–10.5)
Chloride: 96 mEq/L (ref 96–112)
GFR calc Af Amer: 28 mL/min — ABNORMAL LOW (ref >90)
GFR calc Af Amer: 28 mL/min — ABNORMAL LOW (ref >90)
GFR calc non Af Amer: 24 mL/min — ABNORMAL LOW (ref >90)
GFR, EST NON AFRICAN AMERICAN: 24 mL/min — AB (ref >90)
Glucose, Bld: 491 mg/dL — ABNORMAL HIGH (ref 70–99)
Glucose, Bld: 508 mg/dL — ABNORMAL HIGH (ref 70–99)
PHOSPHORUS: 3.5 mg/dL (ref 2.3–4.6)
Phosphorus: 3.4 mg/dL (ref 2.3–4.6)
Potassium: 4.4 mEq/L (ref 3.7–5.3)
Potassium: 4.1 mEq/L (ref 3.7–5.3)
Sodium: 132 mEq/L — ABNORMAL LOW (ref 137–147)
Sodium: 124 mEq/L — ABNORMAL LOW (ref 137–147)

## 2013-07-07 LAB — RHEUMATOID FACTOR: Rhuematoid fact SerPl-aCnc: <10 IU/mL (ref <=14)

## 2013-07-07 LAB — URINE CULTURE

## 2013-07-07 MED ORDER — LAMIVUDINE 150 MG PO TABS
150.0000 mg | ORAL_TABLET | Freq: Every day | ORAL | Status: DC
  Administered 2013-07-07 – 2013-07-09 (×3): 150 mg via ORAL
  Filled 2013-07-07 (×3): qty 1

## 2013-07-07 MED ORDER — INSULIN ASPART 100 UNIT/ML ~~LOC~~ SOLN
0.0000 [IU] | Freq: Every day | SUBCUTANEOUS | Status: DC
  Administered 2013-07-07 – 2013-07-08 (×2): 5 [IU] via SUBCUTANEOUS
  Administered 2013-07-09: via SUBCUTANEOUS
  Administered 2013-07-11: 3 [IU] via SUBCUTANEOUS

## 2013-07-07 MED ORDER — FUROSEMIDE 10 MG/ML IJ SOLN: 20.0000 mg | Freq: Once | INTRAMUSCULAR | Status: DC

## 2013-07-07 MED ORDER — ACETAMINOPHEN 325 MG PO TABS
650.0000 mg | ORAL_TABLET | Freq: Once | ORAL | Status: AC
  Administered 2013-07-07: 650 mg via ORAL
  Filled 2013-07-07: qty 2

## 2013-07-07 MED ORDER — INSULIN ASPART 100 UNIT/ML ~~LOC~~ SOLN
0.0000 [IU] | Freq: Three times a day (TID) | SUBCUTANEOUS | Status: DC
  Administered 2013-07-07: 20 [IU] via SUBCUTANEOUS
  Administered 2013-07-08: 5 [IU] via SUBCUTANEOUS
  Administered 2013-07-08: 3 [IU] via SUBCUTANEOUS
  Administered 2013-07-08 – 2013-07-09 (×2): 7 [IU] via SUBCUTANEOUS
  Administered 2013-07-09 – 2013-07-10 (×2): 20 [IU] via SUBCUTANEOUS
  Administered 2013-07-10: 4 [IU] via SUBCUTANEOUS

## 2013-07-07 MED ORDER — CLONIDINE HCL 0.3 MG PO TABS
0.3000 mg | ORAL_TABLET | Freq: Two times a day (BID) | ORAL | Status: DC
  Administered 2013-07-07 – 2013-07-10 (×6): 0.3 mg via ORAL
  Filled 2013-07-07 (×7): qty 1

## 2013-07-07 MED ORDER — INSULIN ASPART 100 UNIT/ML ~~LOC~~ SOLN
0.0000 [IU] | Freq: Three times a day (TID) | SUBCUTANEOUS | Status: DC
  Administered 2013-07-07: 15 [IU] via SUBCUTANEOUS

## 2013-07-07 MED ORDER — INSULIN GLARGINE 100 UNIT/ML ~~LOC~~ SOLN
50.0000 [IU] | Freq: Every day | SUBCUTANEOUS | Status: DC
  Administered 2013-07-07 – 2013-07-08 (×2): 50 [IU] via SUBCUTANEOUS
  Filled 2013-07-07 (×3): qty 0.5

## 2013-07-07 NOTE — Progress Notes (Signed)
Admit: 07/05/2013 LOS: 2  85F w nephrotic syndrome, progressive AKI, in setting of HCV + VL, +ANA, well controlled HIV, DM2 and hx/o AIHA w/ anemia currenlty. Has hx/o +RPR and TPA. Very broad differential for nephrotic syndrome currently.   Subjective:  1u PRBC yesterady, approp inc in Hb Started furosemide 80 IV TID, appropriate diuresis Lisinopril down to 10mg  Pt feels better, less tightness in legs No c/o.  More awake and alert    02/07 0701 - 02/08 0700 In: 1500 [P.O.:1500] Out: 3826 [Urine:3825; Stool:1]  Filed Weights   07/06/13 0433 07/06/13 0808 07/07/13 0447  Weight: 93.2 kg (205 lb 7.5 oz) 90 kg (198 lb 6.6 oz) 88.542 kg (195 lb 3.2 oz)    Current meds: reviewed  Current Labs: reviewed    Physical Exam:  Blood pressure 164/75, pulse 82, temperature 98.3 F (36.8 C), temperature source Oral, resp. rate 18, height 5' 5.5" (1.664 m), weight 88.542 kg (195 lb 3.2 oz), SpO2 100.00%. GEN: NAD,  ENT: poor dentition, b/l periorbital fullness  EYES: EOMI  CV: RRR, nl s1s2. 2/6 MSM at apex  PULM: CTAB. Nl wob  ABD: obese, s/nt/nd  SKIN: some oozing across shins  EXT:4+ LEE into thighs and dependently; ACE wraps on   Assessment 1. Nephrotic Syndrome, anasarca, hypoalbuminemia.  2. AoCKD 3. HIV, well controlled 4. Positive ANA 1:160 5. HCV, + viremia 6. 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 CKD3 (BL SCr ~1.7) 8. DM2 9. Anemia, normal platelets and coags. Normal haptoglobin. Direct bilirubinemia. Borderline macrocytic  PLAN  1. Cont diuretics at lasix 80 IV TID 2. Low Na Diet 3. Elevate legs as possible 4. Consider transfusion of 1u PRBC in anticipation of biopsy 5. Renal biopsy tomorrow or Tuesday- discussed rational and risks with patient 6. Lisinopril to 10mg  daily (home dose was 40) 7. Have d/c ASA + LMWH in anticipation of biopsy 8. Await serologies (C3, C4, RF, ANCA, SPEP, sFLC) 9. No NSAIDs or IV contrast   Sabra Heck MD 07/07/2013, 11:33 AM   Recent Labs Lab 07/06/13 0525 07/06/13 1820 07/07/13 0515  NA 136* 134* 132*  K 4.6 4.4 4.4  CL 104 102 96  CO2 15* 17* 17*  GLUCOSE 260* 325* 491*  BUN 54* 52* 56*  CREATININE 2.49* 2.33* 2.21*  CALCIUM 7.3* 7.6* 8.0*  PHOS  --  3.2 3.5    Recent Labs Lab 07/02/13 1346 07/05/13 2110  07/06/13 0523 07/06/13 1939 07/07/13 0515  WBC 6.6 7.7  < > 6.0 6.4 6.3  NEUTROABS 5.1 5.9  --   --   --   --   HGB 8.6* 6.9*  < > 6.3* 7.6* 8.4*  HCT 25.2* 19.8*  < > 19.1* 22.0* 25.0*  MCV 101.2* 101.5*  < > 102.1* 100.5* 98.4  PLT 204 181  < > 180 174 199  < > = values in this interval not displayed.

## 2013-07-07 NOTE — Progress Notes (Signed)
Ace wraps were ordered and placed over the patient's dressings.  She did not have any acute changes overnight and she stated that her pain level was okay when asked.

## 2013-07-07 NOTE — Progress Notes (Signed)
The patient's CBG was 448 this morning.  The recheck was 467.  The MD was notified.  Orders were given to administer 9 units of insulin.

## 2013-07-07 NOTE — Progress Notes (Signed)
Subjective: She feels better today. No new complaints. Her wt is down by 4 pounds. Objective: Vital signs in last 24 hours: Filed Vitals:   07/07/13 0447 07/07/13 1100 07/07/13 1433 07/07/13 1805  BP: 164/75 182/94 151/81 164/82  Pulse: 82 82 70 79  Temp: 98.3 F (36.8 C)  97.8 F (36.6 C)   TempSrc: Oral  Oral   Resp: 18  20   Height:      Weight: 195 lb 3.2 oz (88.542 kg)     SpO2: 100%  100%    Weight change: 3.5 oz (0.1 kg)  Intake/Output Summary (Last 24 hours) at 07/07/13 1945 Last data filed at 07/07/13 1923  Gross per 24 hour  Intake   1500 ml  Output   4427 ml  Net  -2927 ml   General: resting in bed Cardiac: RRR, no rubs, murmurs or gallops Pulm: clear to auscultation bilaterally, moving normal volumes of air Abd: soft, nontender, nondistended, BS present Ext: 3+ pitting edema to knee b/l, 3x2cm anterior purulent ulcer on RLE, 5x1 cm and 4x.5cm purulent ulcers on LLE.  RLE more swollen than left. Neuro: alert and oriented X4  Lab Results: Basic Metabolic Panel:  Recent Labs Lab 07/07/13 0515 07/07/13 1529  NA 132* 124*  K 4.4 4.1  CL 96 91*  CO2 17* 17*  GLUCOSE 491* 508*  BUN 56* 59*  CREATININE 2.21* 2.21*  CALCIUM 8.0* 7.9*  PHOS 3.5 3.4   Liver Function Tests:  Recent Labs Lab 07/05/13 2110 07/05/13 2354  07/07/13 0515 07/07/13 1529  AST 40*  --   --   --   --   ALT 47*  --   --   --   --   ALKPHOS 249*  --   --   --   --   BILITOT 2.5* 2.3*  --   --   --   PROT 5.8*  --   --   --   --   ALBUMIN 1.5*  --   < > 1.6* 1.5*  < > = values in this interval not displayed. CBC:  Recent Labs Lab 07/02/13 1346 07/05/13 2110  07/06/13 1939 07/07/13 0515  WBC 6.6 7.7  < > 6.4 6.3  NEUTROABS 5.1 5.9  --   --   --   HGB 8.6* 6.9*  < > 7.6* 8.4*  HCT 25.2* 19.8*  < > 22.0* 25.0*  MCV 101.2* 101.5*  < > 100.5* 98.4  PLT 204 181  < > 174 199  < > = values in this interval not displayed. Cardiac Enzymes:  Recent Labs Lab  07/05/13 2110 07/05/13 2359 07/06/13 0525  TROPONINI <0.30 <0.30 <0.30   BNP:  Recent Labs Lab 07/02/13 1347  PROBNP 294.3*   CBG:  Recent Labs Lab 07/06/13 2115 07/07/13 0657 07/07/13 0701 07/07/13 1108 07/07/13 1522 07/07/13 1620  GLUCAP 339* 448* 467* 462* 508* 478*   Coagulation:  Recent Labs Lab 07/05/13 2110  LABPROT 11.7  INR 0.87   Anemia Panel:  Recent Labs Lab 07/05/13 2354  RETICCTPCT 1.9   Urine Drug Screen: Drugs of Abuse     Component Value Date/Time   LABOPIA POSITIVE* 05/28/2013 1737   COCAINSCRNUR NONE DETECTED 05/28/2013 1737   LABBENZ NONE DETECTED 05/28/2013 1737   AMPHETMU NONE DETECTED 05/28/2013 1737   THCU NONE DETECTED 05/28/2013 1737   LABBARB NONE DETECTED 05/28/2013 1737    Alcohol Level: No results found for this basename: ETH,  in the  last 168 hours Urinalysis:  Recent Labs Lab 07/02/13 1712 07/05/13 2352  COLORURINE YELLOW YELLOW  LABSPEC 1.011 1.016  PHURINE 5.5 5.5  GLUCOSEU NEGATIVE 100*  HGBUR TRACE* TRACE*  BILIRUBINUR NEGATIVE SMALL*  KETONESUR NEGATIVE NEGATIVE  PROTEINUR 100* >300*  UROBILINOGEN 0.2 1.0  NITRITE NEGATIVE NEGATIVE  LEUKOCYTESUR NEGATIVE NEGATIVE    Micro Results: Recent Results (from the past 240 hour(s))  URINE CULTURE     Status: None   Collection Time    07/02/13  5:12 PM      Result Value Range Status   Specimen Description URINE, CLEAN CATCH   Final   Special Requests Immunocompromised   Final   Culture  Setup Time     Final   Value: 07/02/2013 19:28     Performed at [facility] Count     Final   Value: 40,000 COLONIES/ML     Performed at [facility]   Culture     Final   Value: Multiple bacterial morphotypes present, none predominant. Suggest appropriate recollection if clinically indicated.     Performed at [facility]   Report Status 07/03/2013 FINAL   Final  URINE CULTURE     Status: None   Collection Time    07/05/13 11:52 PM       Result Value Range Status   Specimen Description URINE, RANDOM   Final   Special Requests Immunocompromised   Final   Culture  Setup Time     Final   Value: 07/06/2013 06:13     Performed at [facility]   Colony Count     Final   Value: 45,000 COLONIES/ML     Performed at [facility]   Culture     Final   Value: Multiple bacterial morphotypes present, none predominant. Suggest appropriate recollection if clinically indicated.     Performed at [facility]   Report Status 07/07/2013 FINAL   Final   Studies/Results: Dg Chest 2 View  07/05/2013   CLINICAL DATA:  53 year old female chest pain and cough  EXAM: CHEST  2 VIEW  COMPARISON:  DG CHEST 2 VIEW dated 07/02/2013; DG CHEST 1V PORT dated 05/10/2013; DG CHEST 2 VIEW dated 06/18/2013  FINDINGS: The cardiomediastinal silhouette is unremarkable.  Mild peribronchial thickening again noted.  There is no evidence of focal airspace disease, pulmonary edema, suspicious pulmonary nodule/mass, pleural effusion, or pneumothorax. No acute bony abnormalities are identified.  IMPRESSION: No active cardiopulmonary disease.   Electronically Signed   By: Laveda Abbe M.D.   On: 07/05/2013 21:38   Medications: I have reviewed the patient's current medications. Scheduled Meds: . abacavir  600 mg Oral Daily  . acetaminophen  650 mg Oral Once  . acyclovir  800 mg Oral Daily  . cloNIDine  0.2 mg Oral BID  . Darunavir Ethanolate  800 mg Oral Q breakfast  . furosemide  80 mg Intravenous TID  . insulin aspart  0-20 Units Subcutaneous TID WC  . insulin aspart  0-5 Units Subcutaneous QHS  . insulin glargine  50 Units Subcutaneous QHS  . labetalol  100 mg Oral BID  . lamiVUDine  150 mg Oral Daily  . levETIRAcetam  1,500 mg Oral Q12H  . lisinopril  10 mg Oral Daily  . pantoprazole  40 mg Oral Daily  . phenytoin  100 mg Oral TID  . predniSONE  40 mg Oral Q breakfast  . ritonavir  100 mg Oral Q breakfast  Continuous Infusions:  PRN  Meds:. Assessment/Plan:  53 year old woman, with past medical history of dCHF (EF normal, with Grade-2 diastolic dysfunction), HIV (CD4 490 and VL<20 on 05/07/13), chronic HCV, seizure disorder, hx of stroke, HTN, T2DM, CKD, gout, and AIHA, who is admitted through the [facility] for inpatient diuretic therapy. Her outpatient diuretic therapy has been complicated with a raising creatinine and increasing bilateral lower extremity edema with fluid drainage from her legs.  # Acute on Chronic Renal Failure vs Progression of CKD with Nephrotic syndrome: Improving.  -Restarted her on Lisinopril due to massive proteinuria  -appreciate renal consult.  - planned biopsy tomorrow per renal  - Will transfuse with 1 unit of packed red blood cells for a renal biopsy - Strict I&O as may need to diurese, down 4lb since admission, legs are less tight -Patient ANA+ 1:160 (05/28/13), HepC+ (05/21/13), RPR reactive 1:1 (05/07/13).     # Chronic Heart Failure with Preserved EF. -Last echo 05/31/13, grade 2 DD.  Patient with lower extremity edema- likely due to nephrotic syndrome, does not appear intravascularly overloaded and she might actually be intravascularly volume depleted   # HTN: still running high with systolic of 160 and diastolic of 80s to 90s.  - on ACEi - Continue labetalol - increase clonidine from 0.2 mg twice a day to 0.3 mg twice a day.  # Autoimmune hemolytic anemia: Etiology unclear. Patient was seen by Dr Rosie Fate of hematology on -06/27/2013. AHIA thought to be medication induced secondary to ceftriaxone, diagnosed during last admission. Received one unit of packed red blood cells on 07/06/2013. Current hemoglobin 7.6. Plan. -Transfuse with another unit of packed red blood cells for planned renal biopsy. -Continue with Prednisone 40mg  daily  #B/L leg swelling R>L. - Lower extremity Doppler, no evidence of DVT.   # Diabetes: Difficult to control. CBGs elevated after levels of 100s. Home regimen includes  Lantus 30 mg each bedtime and sliding scale NovoLog. Plan - Increase Lantus further from 40 to 50 units at bed -increased SSI to resistant  - cont with HS coverage   # HIV (human immunodeficiency virus infection).  - Well controlled continue home medications   # HCV (hepatitis C virus) - May be candidate for Tx at [facility]  Diet Carb Mod, npo after midnight for renal  Code Status: Full DVT PPx: Lovenox  Dispo: Disposition is deferred at this time, awaiting improvement of current medical problems.  Anticipated discharge in approximately 3-5 day(s) pending plan by nephrology.   The patient does have a current PCP Christen Bame, MD) and does need an [facility] hospital follow-up appointment after discharge.  The patient does not have transportation limitations that hinder transportation to clinic appointments.  .Services Needed at time of discharge: Y = Yes, Blank = No PT:   OT:   RN:   Equipment:   Other:     LOS: 2 days   Dow Adolph, MD 07/07/2013, 7:45 PM

## 2013-07-07 NOTE — Progress Notes (Signed)
Pt.'s CBG level was 462 at 1100 and 467 at 0700, Night Nurse reported MD was aware of overnight high CBG's and to give highest amount of sliding scale insulin coverage. I paged MD on call about 462 CBG and advised MD I'd given the full 15 of sliding scale. MD wanted CBG rechecked. I rechecked CBG and it was 508. MD adjusted sliding scale for high CB and didn't advise me of any further measures at this time.

## 2013-07-08 ENCOUNTER — Inpatient Hospital Stay (HOSPITAL_COMMUNITY): Payer: Medicaid Other

## 2013-07-08 ENCOUNTER — Encounter (HOSPITAL_COMMUNITY): Payer: Self-pay | Admitting: Radiology

## 2013-07-08 DIAGNOSIS — D591 Autoimmune hemolytic anemia, unspecified: Secondary | ICD-10-CM

## 2013-07-08 DIAGNOSIS — I509 Heart failure, unspecified: Secondary | ICD-10-CM

## 2013-07-08 DIAGNOSIS — B1920 Unspecified viral hepatitis C without hepatic coma: Secondary | ICD-10-CM

## 2013-07-08 DIAGNOSIS — E119 Type 2 diabetes mellitus without complications: Secondary | ICD-10-CM

## 2013-07-08 DIAGNOSIS — N179 Acute kidney failure, unspecified: Principal | ICD-10-CM

## 2013-07-08 DIAGNOSIS — I129 Hypertensive chronic kidney disease with stage 1 through stage 4 chronic kidney disease, or unspecified chronic kidney disease: Secondary | ICD-10-CM

## 2013-07-08 DIAGNOSIS — I5032 Chronic diastolic (congestive) heart failure: Secondary | ICD-10-CM

## 2013-07-08 DIAGNOSIS — N189 Chronic kidney disease, unspecified: Secondary | ICD-10-CM

## 2013-07-08 DIAGNOSIS — Z21 Asymptomatic human immunodeficiency virus [HIV] infection status: Secondary | ICD-10-CM

## 2013-07-08 LAB — TYPE AND SCREEN
ABO/RH(D): O POS
ANTIBODY SCREEN: POSITIVE
Unit division: 0
Unit division: 0

## 2013-07-08 LAB — GLUCOSE, CAPILLARY
GLUCOSE-CAPILLARY: 218 mg/dL — AB (ref 70–99)
Glucose-Capillary: 240 mg/dL — ABNORMAL HIGH (ref 70–99)
Glucose-Capillary: 147 mg/dL — ABNORMAL HIGH (ref 70–99)
Glucose-Capillary: 520 mg/dL — ABNORMAL HIGH (ref 70–99)

## 2013-07-08 LAB — PROTEIN / CREATININE RATIO, URINE
CREATININE, URINE: 23.31 mg/dL
PROTEIN CREATININE RATIO: 4.51 — AB (ref 0.00–0.15)
Total Protein, Urine: 105.2 mg/dL

## 2013-07-08 LAB — RENAL FUNCTION PANEL
ALBUMIN: 1.5 g/dL — AB (ref 3.5–5.2)
Albumin: 1.7 g/dL — ABNORMAL LOW (ref 3.5–5.2)
BUN: 64 mg/dL — ABNORMAL HIGH (ref 6–23)
BUN: 63 mg/dL — ABNORMAL HIGH (ref 6–23)
CHLORIDE: 99 meq/L (ref 96–112)
CO2: 20 mEq/L (ref 19–32)
CO2: 20 meq/L (ref 19–32)
CREATININE: 2.3 mg/dL — AB (ref 0.50–1.10)
CREATININE: 2.12 mg/dL — AB (ref 0.50–1.10)
Calcium: 8.2 mg/dL — ABNORMAL LOW (ref 8.4–10.5)
Calcium: 8.6 mg/dL (ref 8.4–10.5)
Chloride: 101 mEq/L (ref 96–112)
GFR calc Af Amer: 29 mL/min — ABNORMAL LOW (ref >90)
GFR calc non Af Amer: 25 mL/min — ABNORMAL LOW (ref >90)
GFR, EST AFRICAN AMERICAN: 27 mL/min — AB (ref >90)
GFR, EST NON AFRICAN AMERICAN: 23 mL/min — AB (ref >90)
GLUCOSE: 234 mg/dL — AB (ref 70–99)
Glucose, Bld: 291 mg/dL — ABNORMAL HIGH (ref 70–99)
Phosphorus: 3.9 mg/dL (ref 2.3–4.6)
Phosphorus: 4.2 mg/dL (ref 2.3–4.6)
Potassium: 3.9 mEq/L (ref 3.7–5.3)
Potassium: 4.1 mEq/L (ref 3.7–5.3)
Sodium: 136 mEq/L — ABNORMAL LOW (ref 137–147)
Sodium: 138 mEq/L (ref 137–147)

## 2013-07-08 LAB — CBC
HEMATOCRIT: 26 % — AB (ref 36.0–46.0)
HEMOGLOBIN: 9.2 g/dL — AB (ref 12.0–15.0)
MCH: 33.6 pg (ref 26.0–34.0)
MCHC: 35.4 g/dL (ref 30.0–36.0)
MCV: 94.9 fL (ref 78.0–100.0)
PLATELETS: 179 10*3/uL (ref 150–400)
RBC: 2.74 MIL/uL — AB (ref 3.87–5.11)
RDW: 17.1 % — ABNORMAL HIGH (ref 11.5–15.5)
WBC: 7.5 10*3/uL (ref 4.0–10.5)

## 2013-07-08 LAB — ANTI-DNA ANTIBODY, DOUBLE-STRANDED: ds DNA Ab: <1 IU/mL

## 2013-07-08 LAB — MPO/PR-3 (ANCA) ANTIBODIES
Myeloperoxidase Abs: <1
Serine Protease 3: <1

## 2013-07-08 LAB — KAPPA/LAMBDA LIGHT CHAINS
KAPPA FREE LGHT CHN: 6.65 mg/dL — AB (ref 0.33–1.94)
Kappa, lambda light chain ratio: 1.09 (ref 0.26–1.65)
LAMDA FREE LIGHT CHAINS: 6.1 mg/dL — AB (ref 0.57–2.63)

## 2013-07-08 LAB — OCCULT BLOOD X 1 CARD TO LAB, STOOL: Fecal Occult Bld: NEGATIVE

## 2013-07-08 LAB — GLUCOSE 6 PHOSPHATE DEHYDROGENASE: G6PDH: 14.3 U/g{Hb} (ref 7.0–20.5)

## 2013-07-08 MED ORDER — FENTANYL CITRATE 0.05 MG/ML IJ SOLN
INTRAMUSCULAR | Status: AC
  Filled 2013-07-08: qty 2

## 2013-07-08 MED ORDER — MIDAZOLAM HCL 2 MG/2ML IJ SOLN
INTRAMUSCULAR | Status: AC
  Filled 2013-07-08: qty 4

## 2013-07-08 MED ORDER — HYDRALAZINE HCL 20 MG/ML IJ SOLN: 10.0000 mg | INTRAMUSCULAR | Status: DC | PRN

## 2013-07-08 MED ORDER — LISINOPRIL 20 MG PO TABS
20.0000 mg | ORAL_TABLET | Freq: Every day | ORAL | Status: DC
  Administered 2013-07-09 – 2013-07-11 (×3): 20 mg via ORAL
  Filled 2013-07-08 (×3): qty 1

## 2013-07-08 MED ORDER — HYDRALAZINE HCL 20 MG/ML IJ SOLN
5.0000 mg | INTRAMUSCULAR | Status: DC | PRN
  Administered 2013-07-08: 5 mg via INTRAVENOUS
  Filled 2013-07-08: qty 1

## 2013-07-08 MED ORDER — OXYCODONE HCL 5 MG PO TABS
5.0000 mg | ORAL_TABLET | Freq: Once | ORAL | Status: AC
  Administered 2013-07-08: 5 mg via ORAL
  Filled 2013-07-08: qty 1

## 2013-07-08 MED ORDER — HYDRALAZINE HCL 20 MG/ML IJ SOLN: 5.0000 mg | Freq: Once | INTRAMUSCULAR | Status: DC

## 2013-07-08 MED ORDER — MIDAZOLAM HCL 2 MG/2ML IJ SOLN
INTRAMUSCULAR | Status: AC | PRN
  Administered 2013-07-08: 1 mg via INTRAVENOUS

## 2013-07-08 MED ORDER — FENTANYL CITRATE 0.05 MG/ML IJ SOLN
INTRAMUSCULAR | Status: AC | PRN
  Administered 2013-07-08 (×2): 25 ug via INTRAVENOUS

## 2013-07-08 MED ORDER — POTASSIUM CHLORIDE CRYS ER 20 MEQ PO TBCR
20.0000 meq | EXTENDED_RELEASE_TABLET | Freq: Every day | ORAL | Status: DC
  Administered 2013-07-08 – 2013-07-11 (×4): 20 meq via ORAL
  Filled 2013-07-08 (×4): qty 1

## 2013-07-08 MED ORDER — CLONIDINE HCL 0.2 MG PO TABS
0.2000 mg | ORAL_TABLET | Freq: Three times a day (TID) | ORAL | Status: DC | PRN
  Filled 2013-07-08: qty 1

## 2013-07-08 MED ORDER — FUROSEMIDE 10 MG/ML IJ SOLN
160.0000 mg | Freq: Three times a day (TID) | INTRAVENOUS | Status: DC
  Administered 2013-07-08 – 2013-07-09 (×3): 160 mg via INTRAVENOUS
  Filled 2013-07-08 (×6): qty 16

## 2013-07-08 NOTE — Assessment & Plan Note (Signed)
Pt was admitted for further diuresis as outpt management has not managed symptoms and pt having increasing Cr.

## 2013-07-08 NOTE — Progress Notes (Signed)
   Subjective:    Patient ID: Michelle [name], female    DOB: [birth year], 53 y.o.   MRN: [id number]  HPI Michelle [name] is a 53 yo woman pmh as listed below presenting for Ed follow up and ongoing leg swelling.   Pt continues to have leg swelling and increased Cr function. Her lasix was increased as an outpt and she has not had good diuresis. She is complaining of intense bilateral leg pain with visible weeping and pruritic pain that has caused her to itch. She is noticing some pus coming from these sites.   Review of Systems  Constitutional: Positive for fatigue and unexpected weight change (weight gain). Negative for fever, chills and activity change.  Eyes: Negative for visual disturbance.  Respiratory: Positive for shortness of breath. Negative for cough and chest tightness.   Cardiovascular: Positive for chest pain (intermittent with movement) and leg swelling. Negative for palpitations.  Gastrointestinal: Negative for nausea, vomiting, diarrhea and constipation.  Endocrine: Negative for polydipsia and polyuria.  Genitourinary: Negative for difficulty urinating.  Skin: Positive for color change and wound.  Neurological: Negative for syncope and headaches.    Past Medical History  Diagnosis Date  . Seizures   . Stroke   . Meningitis   . HIV (human immunodeficiency virus infection)   . Hypertension   . Gout   . Muscle spasms of head and/or neck   . CKD (chronic kidney disease)   . CHF (congestive heart failure)     [name] 06/18/2013  . HCV (hepatitis C virus)     chronic/notes 06/18/2013  . Type II diabetes mellitus     [name] 06/18/2013  . AIHA (autoimmune hemolytic anemia)     [name] 06/18/2013  . Hypertensive encephalopathy ~ 05/2013    hospitalaized/notes 06/18/2013  . Daily headache     "for the last 6 years/notes 06/18/2013  . Exertional shortness of breath     [name] 06/18/2013  . Anxiety     [name] 06/18/2013  . Nephrotic syndrome   . History of syphilis   . High cholesterol      Social, surgical, family history reviewed with patient and updated in appropriate chart locations.     Objective:   Physical Exam Filed Vitals:   07/05/13 1550  BP: 129/76  Pulse: 92  Temp: 98.3 F (36.8 C)   General: sitting in wheelchair HEENT: PERRL, EOMI, no scleral icterus Cardiac: RRR, no rubs, murmurs or gallops Pulm: clear to auscultation bilaterally, moving normal volumes of air Abd: soft, nontender, nondistended, BS present Ext: warm and well perfused,3+ pedal edema to knees bilaterally, visible weeping of clear fluid, excoriations with visible draining pus and foul smell, ttp bilaterally,  Neuro: alert and oriented X3, cranial nerves II-XII grossly intact    Assessment & Plan:  Please see problem oriented charting  Pt discussed with Dr. Kem Kays

## 2013-07-08 NOTE — Progress Notes (Signed)
Patient has returned back to unit from Radiology; will continue to monitor patient. Lorretta Harp RN

## 2013-07-08 NOTE — H&P (Signed)
Chief Complaint: "leg swelling and shortness of breath." Referring Physician: Dr. Joelyn Oms HPI: Michelle [name] is an 53 y.o. female who presented with acute on chronic kidney disease, bilateral leg swelling, shortness of breath and weight gain x 5 days. She complains of intermittent headache. She has been evaluated by nephrology and IR received request for image guided renal biopsy. Patient Cr 2.3 and proteinuria. She has been receiving IV diuresis and states her shortness of breath and leg swelling has improved. She denies any active bleeding, blood in her stool or urine, fever or chills. She denies any known complications to sedation.    Past Medical History:  Past Medical History  Diagnosis Date  . Seizures   . Stroke   . Meningitis   . HIV (human immunodeficiency virus infection)   . Hypertension   . Gout   . Muscle spasms of head and/or neck   . CKD (chronic kidney disease)   . CHF (congestive heart failure)     [name] 06/18/2013  . HCV (hepatitis C virus)     chronic/notes 06/18/2013  . Type II diabetes mellitus     [name] 06/18/2013  . AIHA (autoimmune hemolytic anemia)     [name] 06/18/2013  . Hypertensive encephalopathy ~ 05/2013    hospitalaized/notes 06/18/2013  . Daily headache     "for the last 6 years/notes 06/18/2013  . Exertional shortness of breath     [name] 06/18/2013  . Anxiety     [name] 06/18/2013  . Nephrotic syndrome   . History of syphilis   . High cholesterol     Past Surgical History:  Past Surgical History  Procedure Laterality Date  . Hip pinning Right     Family History:  Family History  Problem Relation Age of Onset  . Cancer - Colon Mother   . Cancer Father   . Diabetes    . Hypertension Father     Social History:  reports that she quit smoking about 3 years ago. Her smoking use included Cigarettes. She smoked 0.00 packs per day. She has never used smokeless tobacco. She reports that she does not drink alcohol or use illicit drugs.  Allergies:   Allergies  Allergen Reactions  . 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 Norvasc [Amlodipine Besylate]     Itching, rash , hives .   [name] Morphine And Related Hives, Itching and Rash      Medication List    ASK your doctor about these medications       abacavir 300 MG tablet  Commonly known as:  ZIAGEN  Take 2 tablets (600 mg total) by mouth daily.     ACCU-CHEK AVIVA PLUS W/DEVICE Kit  USE AS DIRECTED     acyclovir 800 MG tablet  Commonly known as:  ZOVIRAX  Take 1 tablet (800 mg total) by mouth daily.     aspirin EC 81 MG tablet  Take 1 tablet (81 mg total) by mouth daily.     cholecalciferol 1000 UNITS tablet  Commonly known as:  VITAMIN D  Take 1,000 Units by mouth daily.     cloNIDine 0.2 MG tablet  Commonly known as:  CATAPRES  Take 1 tablet (0.2 mg total) by mouth 2 (two) times daily.     Darunavir Ethanolate 800 MG tablet  Commonly known as:  PREZISTA  Take 1 tablet (800 mg total) by mouth daily with breakfast.     furosemide 40 MG  tablet  Commonly known as:  LASIX  Take 80 mg by mouth 2 (two) times daily.     gentamicin 0.3 % ophthalmic ointment  Commonly known as:  GARAMYCIN  Place 1 application into both eyes 4 (four) times daily.     glucose blood test strip  Commonly known as:  ACCU-CHEK AVIVA  Use as instructed     hydrALAZINE 50 MG tablet  Commonly known as:  APRESOLINE  Take 50 mg by mouth 3 (three) times daily.     insulin aspart 100 UNIT/ML injection  Commonly known as:  novoLOG  Inject into the skin 3 (three) times daily before meals. Home Sliding scale     insulin glargine 100 UNIT/ML injection  Commonly known as:  LANTUS  Inject 30 Units into the skin at bedtime.     labetalol 100 MG tablet  Commonly known as:  NORMODYNE  Take 1 tablet (100 mg total) by mouth 2 (two) times daily.     lamiVUDine 150 MG tablet  Commonly known as:  EPIVIR  Take 1 tablet (150 mg total) by mouth daily.      levETIRAcetam 500 MG tablet  Commonly known as:  KEPPRA  Take 3 tablets (1,500 mg total) by mouth every 12 (twelve) hours.     lisinopril 40 MG tablet  Commonly known as:  PRINIVIL,ZESTRIL  Take 1 tablet (40 mg total) by mouth daily.     LORazepam 1 MG tablet  Commonly known as:  ATIVAN  Take 1 mg by mouth daily. Take one tablet for seizure prevention.     methocarbamol 500 MG tablet  Commonly known as:  ROBAXIN  Take 500 mg by mouth every 6 (six) hours as needed for muscle spasms.     NORVIR 100 MG Tabs tablet  Generic drug:  ritonavir  Take 100 mg by mouth daily with breakfast.     Oxycodone HCl 10 MG Tabs  Take 1 tablet (10 mg total) by mouth every 6 (six) hours as needed (severe pain).     pantoprazole 40 MG tablet  Commonly known as:  PROTONIX  Take 1 tablet (40 mg total) by mouth daily.     phenytoin 50 MG tablet  Commonly known as:  DILANTIN  Chew 2 tablets (100 mg total) by mouth 3 (three) times daily.     predniSONE 20 MG tablet  Commonly known as:  DELTASONE  Take 20 mg by mouth daily with breakfast.       Please HPI for pertinent positives, otherwise complete 10 system ROS negative.  Physical Exam: BP 200/90  Pulse 57  Temp(Src) 97.8 F (36.6 C) (Oral)  Resp 18  Ht 5' 5.5" (1.664 m)  Wt 195 lb 15.8 oz (88.9 kg)  BMI 32.11 kg/m2  SpO2 97% Body mass index is 32.11 kg/(m^2).  General Appearance:  Alert, cooperative, no distress  Head:  Normocephalic, without obvious abnormality, atraumatic  Neck: Supple, symmetrical, trachea midline  Lungs:   Clear to auscultation bilaterally, no w/r/r, respirations unlabored without use of accessory muscles.  Chest Wall:  No tenderness or deformity  Heart:  Regular rate and rhythm, S1, S2 normal, no murmur, rub or gallop.  Abdomen:   Soft, non-tender, non distended, (+) BS  Extremities: Extremities 2+ edema, no cyanosis, bilateral gauze wrapping c/d/i  Pulses: 2+ and symmetric  Neurologic: Normal affect, no  gross deficits.   Results for orders placed during the hospital encounter of 07/05/13 (from the past 48 hour(s))  GLUCOSE, CAPILLARY  Status: Abnormal   Collection Time    07/06/13 11:29 AM      Result Value Range   Glucose-Capillary 286 (*) 70 - 99 mg/dL   Comment 1 Notify RN    GLUCOSE, CAPILLARY     Status: Abnormal   Collection Time    07/06/13  4:26 PM      Result Value Range   Glucose-Capillary 287 (*) 70 - 99 mg/dL   Comment 1 Notify RN    RHEUMATOID FACTOR     Status: None   Collection Time    07/06/13  6:20 PM      Result Value Range   Rheumatoid Factor <10  <=14 IU/mL   Comment: (NOTE)                             Interpretive Table                        Low Positive: 15 - 41 IU/mL                        High Positive:  >= 42 IU/mL     In addition to the RF result, and clinical symptoms including joint     involvement, the 2010 ACR Classification Criteria for     scoring/diagnosing Rheumatoid Arthritis include the results of the     following tests:  CRP (87681), ESR (15010), and CCP (APCA) (15726).     www.rheumatology.org/practice/clinical/classification/ra/ra_2010.asp     Performed at [facility]  RENAL FUNCTION PANEL     Status: Abnormal   Collection Time    07/06/13  6:20 PM      Result Value Range   Sodium 134 (*) 137 - 147 mEq/L   Potassium 4.4  3.7 - 5.3 mEq/L   Chloride 102  96 - 112 mEq/L   CO2 17 (*) 19 - 32 mEq/L   Glucose, Bld 325 (*) 70 - 99 mg/dL   BUN 52 (*) 6 - 23 mg/dL   Creatinine, Ser 2.33 (*) 0.50 - 1.10 mg/dL   Calcium 7.6 (*) 8.4 - 10.5 mg/dL   Phosphorus 3.2  2.3 - 4.6 mg/dL   Albumin 1.4 (*) 3.5 - 5.2 g/dL   GFR calc non Af Amer 23 (*) >90 mL/min   GFR calc Af Amer 26 (*) >90 mL/min   Comment: (NOTE)     The eGFR has been calculated using the CKD EPI equation.     This calculation has not been validated in all clinical situations.     eGFR's persistently <90 mL/min signify possible Chronic Kidney     Disease.  PHENYTOIN  LEVEL, TOTAL     Status: Abnormal   Collection Time    07/06/13  6:20 PM      Result Value Range   Phenytoin Lvl 2.6 (*) 10.0 - 20.0 ug/mL  ALBUMIN     Status: Abnormal   Collection Time    07/06/13  6:20 PM      Result Value Range   Albumin 1.4 (*) 3.5 - 5.2 g/dL  CBC     Status: Abnormal   Collection Time    07/06/13  7:39 PM      Result Value Range   WBC 6.4  4.0 - 10.5 K/uL   RBC 2.19 (*) 3.87 - 5.11 MIL/uL   Hemoglobin 7.6 (*) 12.0 - 15.0 g/dL  Comment: DELTA CHECK NOTED     POST TRANSFUSION SPECIMEN   HCT 22.0 (*) 36.0 - 46.0 %   MCV 100.5 (*) 78.0 - 100.0 fL   MCH 34.7 (*) 26.0 - 34.0 pg   MCHC 34.5  30.0 - 36.0 g/dL   RDW 18.0 (*) 11.5 - 15.5 %   Platelets 174  150 - 400 K/uL  GLUCOSE, CAPILLARY     Status: Abnormal   Collection Time    07/06/13  9:15 PM      Result Value Range   Glucose-Capillary 339 (*) 70 - 99 mg/dL  CBC     Status: Abnormal   Collection Time    07/07/13  5:15 AM      Result Value Range   WBC 6.3  4.0 - 10.5 K/uL   RBC 2.54 (*) 3.87 - 5.11 MIL/uL   Hemoglobin 8.4 (*) 12.0 - 15.0 g/dL   HCT 25.0 (*) 36.0 - 46.0 %   MCV 98.4  78.0 - 100.0 fL   MCH 33.1  26.0 - 34.0 pg   MCHC 33.6  30.0 - 36.0 g/dL   RDW 17.6 (*) 11.5 - 15.5 %   Platelets 199  150 - 400 K/uL  RENAL FUNCTION PANEL     Status: Abnormal   Collection Time    07/07/13  5:15 AM      Result Value Range   Sodium 132 (*) 137 - 147 mEq/L   Potassium 4.4  3.7 - 5.3 mEq/L   Chloride 96  96 - 112 mEq/L   CO2 17 (*) 19 - 32 mEq/L   Glucose, Bld 491 (*) 70 - 99 mg/dL   BUN 56 (*) 6 - 23 mg/dL   Creatinine, Ser 2.21 (*) 0.50 - 1.10 mg/dL   Calcium 8.0 (*) 8.4 - 10.5 mg/dL   Phosphorus 3.5  2.3 - 4.6 mg/dL   Albumin 1.6 (*) 3.5 - 5.2 g/dL   GFR calc non Af Amer 24 (*) >90 mL/min   GFR calc Af Amer 28 (*) >90 mL/min   Comment: (NOTE)     The eGFR has been calculated using the CKD EPI equation.     This calculation has not been validated in all clinical situations.     eGFR's  persistently <90 mL/min signify possible Chronic Kidney     Disease.  GLUCOSE, CAPILLARY     Status: Abnormal   Collection Time    07/07/13  6:57 AM      Result Value Range   Glucose-Capillary 448 (*) 70 - 99 mg/dL  GLUCOSE, CAPILLARY     Status: Abnormal   Collection Time    07/07/13  7:01 AM      Result Value Range   Glucose-Capillary 467 (*) 70 - 99 mg/dL  GLUCOSE, CAPILLARY     Status: Abnormal   Collection Time    07/07/13 11:08 AM      Result Value Range   Glucose-Capillary 462 (*) 70 - 99 mg/dL   Comment 1 Notify RN    GLUCOSE, CAPILLARY     Status: Abnormal   Collection Time    07/07/13  3:22 PM      Result Value Range   Glucose-Capillary 508 (*) 70 - 99 mg/dL   Comment 1 Documented in Chart    RENAL FUNCTION PANEL     Status: Abnormal   Collection Time    07/07/13  3:29 PM      Result Value Range   Sodium 124 (*)  137 - 147 mEq/L   Comment: DELTA CHECK NOTED   Potassium 4.1  3.7 - 5.3 mEq/L   Chloride 91 (*) 96 - 112 mEq/L   CO2 17 (*) 19 - 32 mEq/L   Glucose, Bld 508 (*) 70 - 99 mg/dL   BUN 59 (*) 6 - 23 mg/dL   Creatinine, Ser 2.21 (*) 0.50 - 1.10 mg/dL   Calcium 7.9 (*) 8.4 - 10.5 mg/dL   Phosphorus 3.4  2.3 - 4.6 mg/dL   Albumin 1.5 (*) 3.5 - 5.2 g/dL   GFR calc non Af Amer 24 (*) >90 mL/min   GFR calc Af Amer 28 (*) >90 mL/min   Comment: (NOTE)     The eGFR has been calculated using the CKD EPI equation.     This calculation has not been validated in all clinical situations.     eGFR's persistently <90 mL/min signify possible Chronic Kidney     Disease.  GLUCOSE, CAPILLARY     Status: Abnormal   Collection Time    07/07/13  4:20 PM      Result Value Range   Glucose-Capillary 478 (*) 70 - 99 mg/dL   Comment 1 Notify RN    GLUCOSE, CAPILLARY     Status: Abnormal   Collection Time    07/07/13  8:53 PM      Result Value Range   Glucose-Capillary 369 (*) 70 - 99 mg/dL  COMPREHENSIVE METABOLIC PANEL     Status: Abnormal   Collection Time    07/07/13   8:55 PM      Result Value Range   Sodium 134 (*) 137 - 147 mEq/L   Comment: DELTA CHECK NOTED   Potassium 4.2  3.7 - 5.3 mEq/L   Chloride 97  96 - 112 mEq/L   CO2 21  19 - 32 mEq/L   Glucose, Bld 371 (*) 70 - 99 mg/dL   BUN 60 (*) 6 - 23 mg/dL   Creatinine, Ser 2.23 (*) 0.50 - 1.10 mg/dL   Calcium 8.0 (*) 8.4 - 10.5 mg/dL   Total Protein 6.1  6.0 - 8.3 g/dL   Albumin 1.6 (*) 3.5 - 5.2 g/dL   AST 34  0 - 37 U/L   ALT 42 (*) 0 - 35 U/L   Alkaline Phosphatase 336 (*) 39 - 117 U/L   Total Bilirubin 1.3 (*) 0.3 - 1.2 mg/dL   GFR calc non Af Amer 24 (*) >90 mL/min   GFR calc Af Amer 28 (*) >90 mL/min   Comment: (NOTE)     The eGFR has been calculated using the CKD EPI equation.     This calculation has not been validated in all clinical situations.     eGFR's persistently <90 mL/min signify possible Chronic Kidney     Disease.  OCCULT BLOOD X 1 CARD TO LAB, STOOL     Status: None   Collection Time    07/08/13 12:25 AM      Result Value Range   Fecal Occult Bld NEGATIVE  NEGATIVE  PROTEIN / CREATININE RATIO, URINE     Status: Abnormal   Collection Time    07/08/13  1:38 AM      Result Value Range   Creatinine, Urine 23.31     Total Protein, Urine 105.2     Comment: NO NORMAL RANGE ESTABLISHED FOR THIS TEST   PROTEIN CREATININE RATIO 4.51 (*) 0.00 - 0.15  RENAL FUNCTION PANEL     Status: Abnormal  Collection Time    07/08/13  5:36 AM      Result Value Range   Sodium 136 (*) 137 - 147 mEq/L   Potassium 3.9  3.7 - 5.3 mEq/L   Chloride 101  96 - 112 mEq/L   CO2 20  19 - 32 mEq/L   Glucose, Bld 291 (*) 70 - 99 mg/dL   BUN 64 (*) 6 - 23 mg/dL   Creatinine, Ser 2.30 (*) 0.50 - 1.10 mg/dL   Calcium 8.2 (*) 8.4 - 10.5 mg/dL   Phosphorus 3.9  2.3 - 4.6 mg/dL   Albumin 1.5 (*) 3.5 - 5.2 g/dL   GFR calc non Af Amer 23 (*) >90 mL/min   GFR calc Af Amer 27 (*) >90 mL/min   Comment: (NOTE)     The eGFR has been calculated using the CKD EPI equation.     This calculation has not been  validated in all clinical situations.     eGFR's persistently <90 mL/min signify possible Chronic Kidney     Disease.  CBC     Status: Abnormal   Collection Time    07/08/13  5:36 AM      Result Value Range   WBC 7.5  4.0 - 10.5 K/uL   RBC 2.74 (*) 3.87 - 5.11 MIL/uL   Hemoglobin 9.2 (*) 12.0 - 15.0 g/dL   HCT 26.0 (*) 36.0 - 46.0 %   MCV 94.9  78.0 - 100.0 fL   MCH 33.6  26.0 - 34.0 pg   MCHC 35.4  30.0 - 36.0 g/dL   RDW 17.1 (*) 11.5 - 15.5 %   Platelets 179  150 - 400 K/uL  GLUCOSE, CAPILLARY     Status: Abnormal   Collection Time    07/08/13  6:08 AM      Result Value Range   Glucose-Capillary 240 (*) 70 - 99 mg/dL   No results found.  Assessment/Plan Acute on chronic renal failure, Cr 2.3 Nephrotic syndrome. Chronic diastolic CHF. History of Autoimmune hemolytic anemia, on prednisone follow CBC, hgb 9.2 (8.4) Diabetes HIV HCV Request for image guided renal biopsy. Patient has been NPO, labs reviewed. HTN, BP 200/90, needs to be systolic < 681PTEL and diastolic < 07AJHH to proceed with biopsy. Risks and Benefits discussed with the patient. All of the patient's questions were answered, patient is agreeable to proceed. Consent signed and in chart.   Tsosie Billing D PA-C 07/08/2013, 10:19 AM

## 2013-07-08 NOTE — Progress Notes (Addendum)
Inpatient Diabetes Program Recommendations  AACE/ADA: New Consensus Statement on Inpatient Glycemic Control (2013)  Target Ranges:  Prepandial:   less than 140 mg/dL      Peak postprandial:   less than 180 mg/dL (1-2 hours)      Critically ill patients:  140 - 180 mg/dL   Reason for Visit: Results for JARYAH, LAPPIN (MRN [id number]) as of 07/08/2013 12:57  Ref. Range 07/07/2013 15:22 07/07/2013 16:20 07/07/2013 20:53 07/08/2013 06:08 07/08/2013 11:20  Glucose-Capillary Latest Range: 70-99 mg/dL 706 (H) 237 (H) 628 (H) 240 (H) 147 (H)   Outpatient Diabetes medications: Lantus 30 units q HS, Novolog correction/SSI Current orders for Inpatient glycemic control: Lantus 50 units q HS, Novolog resistant tid with meals, Prednisone 40 mg daily   CBG's improved today with increased dose of Lantus.  Note patient NPO.  Once eating, consider adding Novolog 5 units tid with meals.  As steroid decreased, may need to decrease Lantus also.  Note patient's low A1C in December 2014 of 5.0%.    Will follow. Beryl Meager, RN, BC-ADM Inpatient Diabetes Coordinator Pager [phone]

## 2013-07-08 NOTE — Progress Notes (Signed)
The patient has spoken to the physician as well as the PA in radiologist in reference to the procedure, she has now signed the consent form and has no further questions. Lorretta Harp RN

## 2013-07-08 NOTE — Progress Notes (Signed)
The patient is refusing to sign the consent form for Renal Biopsy until she speaks with the physician, MD's resident made aware; will continue to monitor patient. Lorretta Harp RN

## 2013-07-08 NOTE — Progress Notes (Signed)
  Date: 07/08/2013  Patient name: Michelle [name]  Medical record number: [id number]  Date of birth: 04-Feb-1961   This patient has been seen and the plan of care was discussed with the house staff. Please see their note for complete details. I concur with their findings with the following additions/corrections:  Patient is doing well. We explained to her need for Renal biopsy--she had a misunderstanding about the procedure and thought she was having a lumbar puncture. She is agreeable to renal biopsy and understands importance of this diagnostic tests.  Greatly appreciate nephrology's help with this complicated patient.  Randall Hiss, MD 07/08/2013, 12:12 PM

## 2013-07-08 NOTE — Progress Notes (Signed)
Patient's BP is 210/86 via Dinamap and  200/90 manually, morning BP medications was given and BP was rechecked is now 171/87, MD notified and orders given, will continue to monitor patient. Lorretta Harp RN

## 2013-07-08 NOTE — Procedures (Signed)
Technically successful US guided biopsy of the inf pole of the left kidney.  No immediate complications.

## 2013-07-08 NOTE — Progress Notes (Signed)
Internal Medicine Teaching Service: Daily Progress Note  Subjective: Ms. Doke was concerned about the renal biopsy, she seemed to be under the impression that a needle was going to go into her spine, I explained to her that this is not a lumbar puncture but a renal biopsy that well help [place] determine how to treat her kidney disease.  I explained the possible complications including bleeding and infection and she was very agreeable for the procedure. She reports that overall she feels better today, her legs are not bothering her as much and she thinks some of the swelling has decreased.  She does note that she has had an on and off HA that has been mild.   She asked that we call her sister and explain her hospital course to her.  She gave [place] permission to talk about all of her medical issues. Objective: Vital signs in last 24 hours: Filed Vitals:   07/08/13 0058 07/08/13 0215 07/08/13 0230 07/08/13 0418  BP: 162/71 179/77 173/77 169/70  Pulse: 81 79 77 59  Temp: 97.8 F (36.6 C) 97.8 F (36.6 C) 97.1 F (36.2 C) 98.6 F (37 C)  TempSrc: Oral Oral Oral Oral  Resp: 18 18  19   Height:      Weight:    195 lb 15.8 oz (88.9 kg)  SpO2: 99% 99% 99% 100%   Weight change: -2 lb 6.8 oz (-1.1 kg)  Intake/Output Summary (Last 24 hours) at 07/08/13 0952 Last data filed at 07/08/13 0230  Gross per 24 hour  Intake 821.67 ml  Output   2252 ml  Net -1430.33 ml   General: resting in bed, NAD Cardiac: RRR, no rubs, murmurs or gallops Pulm: clear to auscultation bilaterally, moving normal volumes of air Abd: soft, nontender, nondistended, BS present Ext: 3+ pitting edema to knee b/l, gauze wrap to B/L lower extremity c/d/i.   Neuro: alert and oriented X4   Lab Results: Basic Metabolic Panel:  Recent Labs Lab 07/07/13 1529 07/07/13 2055 07/08/13 0536  NA 124* 134* 136*  K 4.1 4.2 3.9  CL 91* 97 101  CO2 17* 21 20  GLUCOSE 508* 371* 291*  BUN 59* 60* 64*  CREATININE 2.21* 2.23* 2.30*   CALCIUM 7.9* 8.0* 8.2*  PHOS 3.4  --  3.9   Liver Function Tests:  Recent Labs Lab 07/05/13 2110 07/05/13 2354  07/07/13 2055 07/08/13 0536  AST 40*  --   --  34  --   ALT 47*  --   --  42*  --   ALKPHOS 249*  --   --  336*  --   BILITOT 2.5* 2.3*  --  1.3*  --   PROT 5.8*  --   --  6.1  --   ALBUMIN 1.5*  --   < > 1.6* 1.5*  < > = values in this interval not displayed. CBC:  Recent Labs Lab 07/02/13 1346 07/05/13 2110  07/07/13 0515 07/08/13 0536  WBC 6.6 7.7  < > 6.3 7.5  NEUTROABS 5.1 5.9  --   --   --   HGB 8.6* 6.9*  < > 8.4* 9.2*  HCT 25.2* 19.8*  < > 25.0* 26.0*  MCV 101.2* 101.5*  < > 98.4 94.9  PLT 204 181  < > 199 179  < > = values in this interval not displayed. Cardiac Enzymes:  Recent Labs Lab 07/05/13 2110 07/05/13 2359 07/06/13 0525  TROPONINI <0.30 <0.30 <0.30   BNP:  Recent Labs  Lab 07/02/13 1347  PROBNP 294.3*   CBG:  Recent Labs Lab 07/07/13 0701 07/07/13 1108 07/07/13 1522 07/07/13 1620 07/07/13 2053 07/08/13 0608  GLUCAP 467* 462* 508* 478* 369* 240*   Coagulation:  Recent Labs Lab 07/05/13 2110  LABPROT 11.7  INR 0.87   Anemia Panel:  Recent Labs Lab 07/05/13 2354  RETICCTPCT 1.9   Urine Drug Screen: Drugs of Abuse     Component Value Date/Time   LABOPIA POSITIVE* 05/28/2013 1737   COCAINSCRNUR NONE DETECTED 05/28/2013 1737   LABBENZ NONE DETECTED 05/28/2013 1737   AMPHETMU NONE DETECTED 05/28/2013 1737   THCU NONE DETECTED 05/28/2013 1737   LABBARB NONE DETECTED 05/28/2013 1737    Alcohol Level: No results found for this basename: ETH,  in the last 168 hours Urinalysis:  Recent Labs Lab 07/02/13 1712 07/05/13 2352  COLORURINE YELLOW YELLOW  LABSPEC 1.011 1.016  PHURINE 5.5 5.5  GLUCOSEU NEGATIVE 100*  HGBUR TRACE* TRACE*  BILIRUBINUR NEGATIVE SMALL*  KETONESUR NEGATIVE NEGATIVE  PROTEINUR 100* >300*  UROBILINOGEN 0.2 1.0  NITRITE NEGATIVE NEGATIVE  LEUKOCYTESUR NEGATIVE NEGATIVE     Micro Results: Recent Results (from the past 240 hour(s))  URINE CULTURE     Status: None   Collection Time    07/02/13  5:12 PM      Result Value Range Status   Specimen Description URINE, CLEAN CATCH   Final   Special Requests Immunocompromised   Final   Culture  Setup Time     Final   Value: 07/02/2013 19:28     Performed at [facility] Count     Final   Value: 40,000 COLONIES/ML     Performed at [facility]   Culture     Final   Value: Multiple bacterial morphotypes present, none predominant. Suggest appropriate recollection if clinically indicated.     Performed at [facility]   Report Status 07/03/2013 FINAL   Final  URINE CULTURE     Status: None   Collection Time    07/05/13 11:52 PM      Result Value Range Status   Specimen Description URINE, RANDOM   Final   Special Requests Immunocompromised   Final   Culture  Setup Time     Final   Value: 07/06/2013 06:13     Performed at [facility]   Colony Count     Final   Value: 45,000 COLONIES/ML     Performed at [facility]   Culture     Final   Value: Multiple bacterial morphotypes present, none predominant. Suggest appropriate recollection if clinically indicated.     Performed at [facility]   Report Status 07/07/2013 FINAL   Final   Studies/Results: No results found. Medications: I have reviewed the patient's current medications. Scheduled Meds: . abacavir  600 mg Oral Daily  . acyclovir  800 mg Oral Daily  . cloNIDine  0.3 mg Oral BID  . Darunavir Ethanolate  800 mg Oral Q breakfast  . furosemide  80 mg Intravenous TID  . insulin aspart  0-20 Units Subcutaneous TID WC  . insulin aspart  0-5 Units Subcutaneous QHS  . insulin glargine  50 Units Subcutaneous QHS  . labetalol  100 mg Oral BID  . lamiVUDine  150 mg Oral Daily  . levETIRAcetam  1,500 mg Oral Q12H  . lisinopril  10 mg Oral Daily  . oxyCODONE  5 mg Oral Once  . pantoprazole  40 mg  Oral Daily  . phenytoin  100 mg Oral TID  . predniSONE  40 mg Oral Q breakfast  . ritonavir  100 mg Oral Q breakfast   Continuous Infusions:  PRN Meds:. Assessment/Plan:  53 year old woman, with past medical history of dCHF (EF normal, with Grade-2 diastolic dysfunction), HIV (CD4 490 and VL<20 on 05/07/13), chronic HCV, seizure disorder, hx of stroke, HTN, T2DM, CKD, gout, and AIHA, [facility]. Her outpatient diuretic therapy has been complicated with a raising creatinine and increasing bilateral lower extremity edema with fluid drainage from her legs.  # Acute on Chronic Renal Failure vs Progression of CKD with Nephrotic syndrome: Improving.  -Restarted her on Lisinopril due to massive proteinuria (decreased dose to 10mg  per renal -appreciate renal consult.  - Renal biopsy today to distinguish cause of Nephrotic syndrome as patient has multiple potential causes. -Avoid NSAID, IV contrast - Awaiting Autoimmune workup results.   # Chronic Heart Failure with Preserved EF. -Last echo 05/31/13, grade 2 DD.   - Net 4 L neg since admission, weights appear unreliable (205 on admission, 195 on next day, no weight today) - Lasix 80 IV TID (home 80 PO BID)  # HTN - on ACEi - Continue labetalol - clonidine 0.3 mg twice a day. -Lasix as above  # Autoimmune hemolytic anemia: Etiology unclear. Patient was seen by Dr Rosie Fatehism of hematology on -06/27/2013. AHIA thought to be medication induced secondary to ceftriaxone, diagnosed during last admission. Received one unit of packed red blood cells on 07/06/2013. Current hemoglobin 9.2. Plan. -Continue with Prednisone 40mg  daily consider titration back down to 20mg . - Daily CBCs   # Diabetes: hyperglycemia improved not at goal Plan - Lantus 50 units QHS -SSI resistant   - cont with HS coverage - Patient currently NPO, if CBG not at goal will add Novolog 5 units with meals   # HIV (human  immunodeficiency virus infection).  - Well controlled continue home medications   # HCV (hepatitis C virus) Possible Cirrhosis - May be candidate for Tx at [facility]  Diet Carb Mod, (currently NPO for procedure) Code Status: Full DVT PPx: Lovenox  Dispo: Disposition is deferred at this time, awaiting improvement of current medical problems.  Anticipated discharge in approximately 3-5 day(s) pending plan by nephrology.   The patient does have a current PCP Christen Bame(Nora Sadek, MD) and [facility].  The patient does not have transportation limitations that hinder transportation to clinic appointments.  .Services Needed at time of discharge: Y = Yes, Blank = No PT:   OT:   RN:   Equipment:   Other:     LOS: 3 days   Carlynn PurlErik Hoffman, DO 07/08/2013, 9:52 AM

## 2013-07-08 NOTE — Progress Notes (Signed)
Admit: 07/05/2013 LOS: 3  89F w nephrotic syndrome, progressive AKI, in setting of HCV + VL, +ANA, well controlled HIV, DM2 and hx/o AIHA w/ anemia currenlty. Has hx/o +RPR and TPA. Very broad differential for nephrotic syndrome currently.   Subjective:  1u PRBC Saturday, approp inc in Hb Started furosemide 80 IV TID, appropriate diuresis but going down Lisinopril  10mg  Pt feels better, less tightness in legs "im ready to eat" is NPO for biopsy they would like BP down first    02/08 0701 - 02/09 0700 In: 1061.7 [P.O.:720; Blood:341.7] Out: 2453 [Urine:2450; Stool:3]  Filed Weights   07/06/13 0808 07/07/13 0447 07/08/13 0418  Weight: 90 kg (198 lb 6.6 oz) 88.542 kg (195 lb 3.2 oz) 88.9 kg (195 lb 15.8 oz)    Current meds: reviewed  Current Labs: reviewed    Physical Exam:  Blood pressure 171/87, pulse 109, temperature 97.8 F (36.6 C), temperature source Oral, resp. rate 18, height 5' 5.5" (1.664 m), weight 88.9 kg (195 lb 15.8 oz), SpO2 97.00%. GEN: NAD,  ENT: poor dentition, b/l periorbital fullness  EYES: EOMI  CV: RRR, nl s1s2. 2/6 MSM at apex  PULM: CTAB. Nl wob  ABD: obese, s/nt/nd  SKIN: some oozing across shins  EXT:4+ LEE into thighs and dependently; ACE wraps on   Assessment 1. Nephrotic Syndrome, anasarca, hypoalbuminemia. - renal function stable  2. AoCKD 3. HIV, well controlled 4. Positive ANA 1:160 5. HCV, + viremia 6. 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 CKD3 (BL SCr ~1.7) 8. DM2 9. Anemia, normal platelets and coags. Normal haptoglobin. Direct bilirubinemia. Borderline macrocytic  PLAN  1. Cont diuretics , will increase to 160 TID and put on low dose potassium in anticipation 2. Low Na Diet 3. Elevate legs as possible  4. Renal biopsy today or Tuesday- discussed rational and risks with patient - hopefully able to get bp down by end of day so can do today.  5. Lisinopril to 10mg  daily (home dose was 40)- will  increase to 20 given high BP 6. Have d/c ASA + LMWH in anticipation of biopsy 7. Await serologies (C3, C4, RF, ANCA, SPEP, sFLC) 8. No NSAIDs or IV contrast   Michelle [name] A  07/08/2013, 11:58 AM   Recent Labs Lab 07/07/13 0515 07/07/13 1529 07/07/13 2055 07/08/13 0536  NA 132* 124* 134* 136*  K 4.4 4.1 4.2 3.9  CL 96 91* 97 101  CO2 17* 17* 21 20  GLUCOSE 491* 508* 371* 291*  BUN 56* 59* 60* 64*  CREATININE 2.21* 2.21* 2.23* 2.30*  CALCIUM 8.0* 7.9* 8.0* 8.2*  PHOS 3.5 3.4  --  3.9    Recent Labs Lab 07/02/13 1346 07/05/13 2110  07/06/13 1939 07/07/13 0515 07/08/13 0536  WBC 6.6 7.7  < > 6.4 6.3 7.5  NEUTROABS 5.1 5.9  --   --   --   --   HGB 8.6* 6.9*  < > 7.6* 8.4* 9.2*  HCT 25.2* 19.8*  < > 22.0* 25.0* 26.0*  MCV 101.2* 101.5*  < > 100.5* 98.4 94.9  PLT 204 181  < > 174 199 179  < > = values in this interval not displayed.

## 2013-07-09 ENCOUNTER — Encounter (HOSPITAL_COMMUNITY): Payer: Self-pay | Admitting: Radiology

## 2013-07-09 ENCOUNTER — Ambulatory Visit: Payer: Medicaid Other | Admitting: Internal Medicine

## 2013-07-09 ENCOUNTER — Inpatient Hospital Stay (HOSPITAL_COMMUNITY): Payer: Medicaid Other

## 2013-07-09 ENCOUNTER — Telehealth: Payer: Self-pay | Admitting: *Deleted

## 2013-07-09 DIAGNOSIS — G9340 Encephalopathy, unspecified: Secondary | ICD-10-CM

## 2013-07-09 LAB — CBC
HCT: 26.1 % — ABNORMAL LOW (ref 36.0–46.0)
Hemoglobin: 8.9 g/dL — ABNORMAL LOW (ref 12.0–15.0)
MCH: 32.7 pg (ref 26.0–34.0)
MCHC: 34.1 g/dL (ref 30.0–36.0)
MCV: 96 fL (ref 78.0–100.0)
Platelets: 222 10*3/uL (ref 150–400)
RBC: 2.72 MIL/uL — ABNORMAL LOW (ref 3.87–5.11)
RDW: 17.5 % — ABNORMAL HIGH (ref 11.5–15.5)
WBC: 5.8 10*3/uL (ref 4.0–10.5)

## 2013-07-09 LAB — RENAL FUNCTION PANEL
ALBUMIN: 1.5 g/dL — AB (ref 3.5–5.2)
Albumin: 1.7 g/dL — ABNORMAL LOW (ref 3.5–5.2)
BUN: 66 mg/dL — AB (ref 6–23)
BUN: 68 mg/dL — ABNORMAL HIGH (ref 6–23)
CO2: 20 mEq/L (ref 19–32)
CO2: 20 meq/L (ref 19–32)
Calcium: 8 mg/dL — ABNORMAL LOW (ref 8.4–10.5)
Calcium: 8.1 mg/dL — ABNORMAL LOW (ref 8.4–10.5)
Chloride: 99 mEq/L (ref 96–112)
Chloride: 95 meq/L — ABNORMAL LOW (ref 96–112)
Creatinine, Ser: 2.24 mg/dL — ABNORMAL HIGH (ref 0.50–1.10)
Creatinine, Ser: 2.46 mg/dL — ABNORMAL HIGH (ref 0.50–1.10)
GFR calc Af Amer: 27 mL/min — ABNORMAL LOW (ref >90)
GFR calc Af Amer: 24 mL/min — ABNORMAL LOW
GFR calc non Af Amer: 24 mL/min — ABNORMAL LOW (ref >90)
GFR calc non Af Amer: 21 mL/min — ABNORMAL LOW
GLUCOSE: 316 mg/dL — AB (ref 70–99)
Glucose, Bld: 517 mg/dL — ABNORMAL HIGH (ref 70–99)
PHOSPHORUS: 4.1 mg/dL (ref 2.3–4.6)
Phosphorus: 3.7 mg/dL (ref 2.3–4.6)
Potassium: 3.9 mEq/L (ref 3.7–5.3)
Potassium: 4.7 meq/L (ref 3.7–5.3)
SODIUM: 137 meq/L (ref 137–147)
Sodium: 132 meq/L — ABNORMAL LOW (ref 137–147)

## 2013-07-09 LAB — BLOOD GAS, ARTERIAL
Acid-base deficit: 1 mmol/L (ref 0.0–2.0)
Bicarbonate: 22.7 meq/L (ref 20.0–24.0)
Drawn by: 22251
FIO2: 0.21 %
O2 Saturation: 98.5 %
Patient temperature: 98.6
TCO2: 23.7 mmol/L (ref 0–100)
pCO2 arterial: 34.1 mmHg — ABNORMAL LOW (ref 35.0–45.0)
pH, Arterial: 7.438 (ref 7.350–7.450)
pO2, Arterial: 103 mmHg — ABNORMAL HIGH (ref 80.0–100.0)

## 2013-07-09 LAB — C3 COMPLEMENT: C3 Complement: 215 mg/dL — ABNORMAL HIGH (ref 90–180)

## 2013-07-09 LAB — PROTEIN ELECTROPHORESIS, SERUM
ALPHA-2-GLOBULIN: 20.9 % — AB (ref 7.1–11.8)
Albumin ELP: 40 % — ABNORMAL LOW (ref 55.8–66.1)
Alpha-1-Globulin: 11.2 % — ABNORMAL HIGH (ref 2.9–4.9)
BETA GLOBULIN: 6.1 % (ref 4.7–7.2)
Beta 2: 6.6 % — ABNORMAL HIGH (ref 3.2–6.5)
Gamma Globulin: 15.2 % (ref 11.1–18.8)
M-SPIKE, %: NOT DETECTED g/dL
TOTAL PROTEIN ELP: 4.6 g/dL — AB (ref 6.0–8.3)

## 2013-07-09 LAB — GLUCOSE, CAPILLARY
Glucose-Capillary: 204 mg/dL — ABNORMAL HIGH (ref 70–99)
Glucose-Capillary: 285 mg/dL — ABNORMAL HIGH (ref 70–99)
Glucose-Capillary: 366 mg/dL — ABNORMAL HIGH (ref 70–99)
Glucose-Capillary: 453 mg/dL — ABNORMAL HIGH (ref 70–99)
Glucose-Capillary: 490 mg/dL — ABNORMAL HIGH (ref 70–99)
Glucose-Capillary: 504 mg/dL — ABNORMAL HIGH (ref 70–99)

## 2013-07-09 LAB — C4 COMPLEMENT: Complement C4, Body Fluid: 44 mg/dL — ABNORMAL HIGH (ref 10–40)

## 2013-07-09 MED ORDER — INSULIN ASPART 100 UNIT/ML ~~LOC~~ SOLN
5.0000 [IU] | Freq: Once | SUBCUTANEOUS | Status: AC
  Administered 2013-07-09: 5 [IU] via SUBCUTANEOUS

## 2013-07-09 MED ORDER — ASPIRIN EC 81 MG PO TBEC: 81.0000 mg | DELAYED_RELEASE_TABLET | Freq: Every day | ORAL | Status: DC

## 2013-07-09 MED ORDER — OXYCODONE HCL 5 MG PO TABS
5.0000 mg | ORAL_TABLET | Freq: Once | ORAL | Status: AC
  Administered 2013-07-09: 5 mg via ORAL
  Filled 2013-07-09: qty 1

## 2013-07-09 MED ORDER — LAMIVUDINE 10 MG/ML PO SOLN
100.0000 mg | Freq: Every day | ORAL | Status: DC
  Administered 2013-07-10 – 2013-07-11 (×2): 100 mg via ORAL
  Filled 2013-07-09 (×2): qty 10

## 2013-07-09 MED ORDER — OXYCODONE HCL 5 MG PO TABS
5.0000 mg | ORAL_TABLET | Freq: Once | ORAL | Status: AC
  Administered 2013-07-09: 5 mg via ORAL
  Filled 2013-07-09 (×2): qty 1

## 2013-07-09 MED ORDER — INSULIN GLARGINE 100 UNIT/ML ~~LOC~~ SOLN
60.0000 [IU] | Freq: Every day | SUBCUTANEOUS | Status: DC
  Administered 2013-07-09 – 2013-07-11 (×2): 60 [IU] via SUBCUTANEOUS
  Filled 2013-07-09 (×3): qty 0.6

## 2013-07-09 MED ORDER — INSULIN ASPART 100 UNIT/ML ~~LOC~~ SOLN
5.0000 [IU] | Freq: Three times a day (TID) | SUBCUTANEOUS | Status: DC
  Administered 2013-07-09 – 2013-07-10 (×3): 5 [IU] via SUBCUTANEOUS

## 2013-07-09 MED ORDER — FUROSEMIDE 10 MG/ML IJ SOLN
160.0000 mg | Freq: Two times a day (BID) | INTRAVENOUS | Status: DC
  Administered 2013-07-09 – 2013-07-10 (×2): 160 mg via INTRAVENOUS
  Filled 2013-07-09 (×3): qty 16

## 2013-07-09 MED ORDER — ACETAMINOPHEN 325 MG PO TABS
650.0000 mg | ORAL_TABLET | Freq: Once | ORAL | Status: AC
  Administered 2013-07-09: 650 mg via ORAL
  Filled 2013-07-09: qty 2

## 2013-07-09 MED ORDER — ASPIRIN EC 325 MG PO TBEC
325.0000 mg | DELAYED_RELEASE_TABLET | Freq: Every day | ORAL | Status: DC
  Administered 2013-07-09 – 2013-07-11 (×3): 325 mg via ORAL
  Filled 2013-07-09 (×4): qty 1

## 2013-07-09 MED ORDER — INSULIN ASPART 100 UNIT/ML ~~LOC~~ SOLN
30.0000 [IU] | Freq: Once | SUBCUTANEOUS | Status: AC
  Administered 2013-07-09: 30 [IU] via SUBCUTANEOUS

## 2013-07-09 NOTE — Progress Notes (Addendum)
Internal Medicine Teaching Service: Daily Progress Note  Subjective: Patient does note some loose stools. Otherwise she is feeling well. Reports that the biopsy went well and has no pain at the site.  She admits a mild HA.  She keeps repeating that she takes 325 for her headache at home, does not respond to which provider gives "325" just responds with 325. Objective: Vital signs in last 24 hours: Filed Vitals:   07/08/13 1730 07/08/13 1800 07/08/13 2153 07/09/13 0605  BP: 159/82 154/93 155/74 146/82  Pulse:   93 77  Temp:   98.4 F (36.9 C) 98.6 F (37 C)  TempSrc:   Oral Oral  Resp:   18 18  Height:      Weight:    185 lb 6.5 oz (84.1 kg)  SpO2:   100% 100%   Weight change: -10 lb 9.3 oz (-4.8 kg)  Intake/Output Summary (Last 24 hours) at 07/09/13 0718 Last data filed at 07/09/13 0608  Gross per 24 hour  Intake    360 ml  Output   6150 ml  Net  -5790 ml   General: resting in bed, NAD Cardiac: RRR, no rubs, murmurs or gallops Pulm: clear to auscultation bilaterally, moving normal volumes of air Abd: soft, nontender, nondistended, BS present Ext: 3+ pitting edema to knee b/l, less swollen, gauze wrap to B/L lower extremity c/d/i.   Neuro: alert but has trouble responding appropriately to questions, with difficulty she was able to give the date within one day, she has no focal crainal nerve defect or extremity weakness.  Lab Results: Basic Metabolic Panel:  Recent Labs Lab 07/08/13 1600 07/09/13 0340  NA 138 137  K 4.1 3.9  CL 99 99  CO2 20 20  GLUCOSE 234* 316*  BUN 63* 66*  CREATININE 2.12* 2.24*  CALCIUM 8.6 8.0*  PHOS 4.2 4.1   Liver Function Tests:  Recent Labs Lab 07/05/13 2110 07/05/13 2354  07/07/13 2055  07/08/13 1600 07/09/13 0340  AST 40*  --   --  34  --   --   --   ALT 47*  --   --  42*  --   --   --   ALKPHOS 249*  --   --  336*  --   --   --   BILITOT 2.5* 2.3*  --  1.3*  --   --   --   PROT 5.8*  --   --  6.1  --   --   --   ALBUMIN  1.5*  --   < > 1.6*  < > 1.7* 1.5*  < > = values in this interval not displayed. CBC:  Recent Labs Lab 07/02/13 1346 07/05/13 2110  07/08/13 0536 07/09/13 0340  WBC 6.6 7.7  < > 7.5 5.8  NEUTROABS 5.1 5.9  --   --   --   HGB 8.6* 6.9*  < > 9.2* 8.9*  HCT 25.2* 19.8*  < > 26.0* 26.1*  MCV 101.2* 101.5*  < > 94.9 96.0  PLT 204 181  < > 179 222  < > = values in this interval not displayed. Cardiac Enzymes:  Recent Labs Lab 07/05/13 2110 07/05/13 2359 07/06/13 0525  TROPONINI <0.30 <0.30 <0.30   BNP:  Recent Labs Lab 07/02/13 1347  PROBNP 294.3*   CBG:  Recent Labs Lab 07/08/13 0608 07/08/13 1120 07/08/13 1615 07/08/13 2149 07/09/13 0032 07/09/13 0227  GLUCAP 240* 147* 218* 520* 453* 366*   Coagulation:  Recent Labs Lab 07/05/13 2110  LABPROT 11.7  INR 0.87   Anemia Panel:  Recent Labs Lab 07/05/13 2354  RETICCTPCT 1.9   Urine Drug Screen: Drugs of Abuse     Component Value Date/Time   LABOPIA POSITIVE* 05/28/2013 1737   COCAINSCRNUR NONE DETECTED 05/28/2013 1737   LABBENZ NONE DETECTED 05/28/2013 1737   AMPHETMU NONE DETECTED 05/28/2013 1737   THCU NONE DETECTED 05/28/2013 1737   LABBARB NONE DETECTED 05/28/2013 1737    Alcohol Level: No results found for this basename: ETH,  in the last 168 hours Urinalysis:  Recent Labs Lab 07/02/13 1712 07/05/13 2352  COLORURINE YELLOW YELLOW  LABSPEC 1.011 1.016  PHURINE 5.5 5.5  GLUCOSEU NEGATIVE 100*  HGBUR TRACE* TRACE*  BILIRUBINUR NEGATIVE SMALL*  KETONESUR NEGATIVE NEGATIVE  PROTEINUR 100* >300*  UROBILINOGEN 0.2 1.0  NITRITE NEGATIVE NEGATIVE  LEUKOCYTESUR NEGATIVE NEGATIVE    Micro Results: Recent Results (from the past 240 hour(s))  URINE CULTURE     Status: None   Collection Time    07/02/13  5:12 PM      Result Value Range Status   Specimen Description URINE, CLEAN CATCH   Final   Special Requests Immunocompromised   Final   Culture  Setup Time     Final   Value:  07/02/2013 19:28     Performed at [facility]   Colony Count     Final   Value: 40,000 COLONIES/ML     Performed at [facility]   Culture     Final   Value: Multiple bacterial morphotypes present, none predominant. Suggest appropriate recollection if clinically indicated.     Performed at [facility]   Report Status 07/03/2013 FINAL   Final  URINE CULTURE     Status: None   Collection Time    07/05/13 11:52 PM      Result Value Range Status   Specimen Description URINE, RANDOM   Final   Special Requests Immunocompromised   Final   Culture  Setup Time     Final   Value: 07/06/2013 06:13     Performed at [facility]   Colony Count     Final   Value: 45,000 COLONIES/ML     Performed at [facility]   Culture     Final   Value: Multiple bacterial morphotypes present, none predominant. Suggest appropriate recollection if clinically indicated.     Performed at [facility]   Report Status 07/07/2013 FINAL   Final   Studies/Results: [place] Biopsy  07/08/2013   INDICATION: History of HIV, hepatitis-C, diabetes, now with acute on chronic renal insufficiency and nephrotic syndrome  EXAM: ULTRASOUND GUIDED RENAL BIOPSY  MEDICATIONS: Fentanyl 50 mcg IV; Versed 1 mg IV  ANESTHESIA/SEDATION: Total Moderate Sedation time  15 minutes  COMPARISON:  CT CHEST W/O CM dated 06/19/2013; US ABDOMEN COMPLETE dated 06/19/2013  PROCEDURE: Informed written consent was obtained from the patient after a discussion of the risks, benefits and alternatives to treatment. The patient understands and consents the procedure. A timeout was performed prior to the initiation of the procedure.  Ultrasound scanning was performed of the bilateral flanks. The inferior pole of the left kidney was selected for biopsy due to location and sonographic window. The procedure was planned. The operative site was prepped and draped in the usual sterile fashion. The overlying soft tissues were  anesthetized with 1% lidocaine with epinephrine. A 17 gauge core needle biopsy device was advanced  into the inferior cortex of the left kidney and 3 core biopsies were obtained under direct ultrasound guidance. Real time pathologic review confirmed adequate tissue acquisition. Images were saved for documentation purposes. The biopsy device was removed and hemostasis was obtained with manual compression. Post procedural scanning was negative for significant post procedural hemorrhage or additional complication. A dressing was placed. The patient tolerated the procedure well without immediate post procedural complication.  COMPLICATIONS: None immediate  IMPRESSION: Technically successful ultrasound guided left renal biopsy.   Electronically Signed   By: Simonne Come M.D.   On: 07/08/2013 16:36   Medications: I have reviewed the patient's current medications. Scheduled Meds: . abacavir  600 mg Oral Daily  . acyclovir  800 mg Oral Daily  . cloNIDine  0.3 mg Oral BID  . Darunavir Ethanolate  800 mg Oral Q breakfast  . furosemide  160 mg Intravenous TID  . insulin aspart  0-20 Units Subcutaneous TID WC  . insulin aspart  0-5 Units Subcutaneous QHS  . insulin glargine  50 Units Subcutaneous QHS  . labetalol  100 mg Oral BID  . lamiVUDine  150 mg Oral Daily  . levETIRAcetam  1,500 mg Oral Q12H  . lisinopril  20 mg Oral Daily  . pantoprazole  40 mg Oral Daily  . phenytoin  100 mg Oral TID  . potassium chloride  20 mEq Oral Daily  . predniSONE  40 mg Oral Q breakfast  . ritonavir  100 mg Oral Q breakfast   Continuous Infusions:  PRN Meds:. Assessment/Plan:  53 year old woman, with past medical history of dCHF (EF normal, with Grade-2 diastolic dysfunction), HIV (CD4 490 and VL<20 on 05/07/13), chronic HCV, seizure disorder, hx of stroke, HTN, T2DM, CKD, gout, and AIHA, who is admitted through the [facility] for inpatient diuretic therapy. Her outpatient diuretic therapy has been complicated with a raising  creatinine and increasing bilateral lower extremity edema with fluid drainage from her legs.  #Acute encephalopathy -Stat CT head - Abg - Neuro checks q2 - Hold AM BP meds - ASA 325mg  -Transfer to stepdown  # Acute on Chronic Renal Failure vs Progression of CKD with Nephrotic syndrome: Improving.  - Lisinopril due to massive proteinuria (decreased dose to 20mg  per renal) -appreciate renal consult.  -Avoid NSAID, IV contrast - Autoimmune workup results: dsDNA <1, MpoAb <1, Serine Protease 3 <1. Rf <10, C3 215, C4, 44, Kappa light chains 6.65 lambda light chains 6.1, Protein electrophoresis pending. - Renal Biopsy pending   # Chronic Heart Failure with Preserved EF. -Last echo 05/31/13, grade 2 DD.   - Net 10 L neg since admission, weight down 13-20 lbs. - Lasix 160 IV TID (home 80 PO BID)  # HTN - on ACEi - Continue labetalol - clonidine 0.3 mg twice a day. Clonidine 0.23mg  TID PRN for SBP >160 -Lasix as above  # Autoimmune hemolytic anemia: Etiology unclear. Patient was seen by Dr Rosie Fate of hematology on -06/27/2013. AHIA thought to be medication induced secondary to ceftriaxone, diagnosed during last admission. Received one unit of packed red blood cells on 07/06/2013. ? Due to Lupus Plan. -Continue with Prednisone 40mg  daily consider titration back down to 20mg  on discharge. - Daily CBCs   # Diabetes: hyperglycemia improved not at goal Plan - Lantus 50 units QHS -SSI resistant   - cont with HS coverage - add Novolog 5 units with meals   # HIV (human immunodeficiency virus infection).  - Well controlled continue home medications   # HCV (  hepatitis C virus) Possible Cirrhosis - May be candidate for Tx at [facility]  Diet Renal Code Status: Full DVT PPx: SCDs  Dispo: Disposition is deferred at this time, awaiting improvement of current medical problems.  Anticipated discharge in approximately 3-5 day(s) pending plan by nephrology.   The patient does have a current PCP Christen Bame, MD) and does need an [facility] hospital follow-up appointment after discharge.  The patient does not have transportation limitations that hinder transportation to clinic appointments.  .Services Needed at time of discharge: Y = Yes, Blank = No PT:   OT:   RN:   Equipment:   Other:     LOS: 4 days   Carlynn Purl, DO 07/09/2013, 7:18 AM    Date: 07/09/2013  Patient name: Michelle [name]  Medical record number: [id number]  Date of birth: 1960/07/05   This patient has been seen and the plan of care was discussed with the house staff. Please see their note for complete details. I concur with their findings with the following additions/corrections:  This morning the patient upon awakening appeared confused and possibly be suffering from expressive aphasia. She stated she had a migraine headache. A great deal of difficulty answering the majority of our Questions some of her answers make sense but she also kept repeating 325 is the answer to multiple questions including who her neurologist was and where she was.  We ordered a stat CT of the brain which did not show an acute finding. We are also restarting aspirin in the the arc[facility] stepdown your unit with frequent neuro checks.  We'll consider further workup for possible stroke with possible MRI of brain, carotid dopplers.  Her TTE from January showed Diasotlic dysfunction and LVH but not much else  That was abnormal.  Will check ABG  With re to the cause of her renal dysfunction, renal biopsy is pending. She has responded nicely to diuretics  I will adjust her dose of epivir to 100mg  daily  Given her GFR        Randall Hiss, MD 07/09/2013, 11:50 AM

## 2013-07-09 NOTE — Progress Notes (Signed)
CRITICAL VALUE ALERT  Critical value received:  CBG=504  Date of notification:  07/09/13  Time of notification:  1647  Critical value read back: yes  Nurse who received alert:  Junie Panning  MD notified (1st page):  Hoffman  Time of first page:  1647  MD notified (2nd page):  Time of second page:  Responding MD:  Mikey Bussing  Time MD responded:  [phone]

## 2013-07-09 NOTE — Progress Notes (Signed)
Inpatient Diabetes Program Recommendations  AACE/ADA: New Consensus Statement on Inpatient Glycemic Control (2013)  Target Ranges:  Prepandial:   less than 140 mg/dL      Peak postprandial:   less than 180 mg/dL (1-2 hours)      Critically ill patients:  140 - 180 mg/dL   Hyperglycemia on steroid therapy  Inpatient Diabetes Program Recommendations Insulin - Meal Coverage: Please consider addition of 3 units meal coverage tidwc. Pt now eating 100%  Thank you, Lenor Coffin, RN, CNS, Diabetes Coordinator [phone])

## 2013-07-09 NOTE — Progress Notes (Signed)
The patient's CBG was 520.  Lantus was given.  The MD was notified.  Orders were given to administer 5 units of Novolog and to recheck the patient's CBG in one hour.  Upon recheck, the patient's CBG was 453.  Orders were given to administer an additional 5 units of Novolog and to recheck the patient's CBG again in an hour.  The patient's CBG was 366 upon recheck.  The RN will continue to monitor the patient.

## 2013-07-09 NOTE — Telephone Encounter (Signed)
Called the patient and was advised that she just forgot and she has lots of issues going on at this time and she will call us when she feels up to coming. Advised her due to all she has going on now is the time to stay on top of her health and to call us when she needs to be seen.

## 2013-07-09 NOTE — Progress Notes (Signed)
CRITICAL VALUE ALERT  Critical value received:  CBG=490  Date of notification:  07/09/13  Time of notification:  1130  Critical value read back:yes  Nurse who received alert:  Junie Panning  MD notified (1st page): Hoffman  Time of first page:  1130  MD notified (2nd page):  Time of second page:  Responding MD:  Mikey Bussing  Time MD responded: 1131

## 2013-07-09 NOTE — Progress Notes (Addendum)
Report given to receiving RN. Patient is stable with no verbal complaints and no signs or symptoms of distress or discomfort.  

## 2013-07-09 NOTE — Progress Notes (Signed)
Admit: 07/05/2013 LOS: 4  13F w nephrotic syndrome, progressive CKD, in setting of HCV + VL, +ANA, well controlled HIV, DM2 and hx/o AIHA w/ anemia currenlty. Has hx/o +RPR and TPA. Very broad differential for nephrotic syndrome currently. S/p renal biopsy on 2/9   Subjective:  Pt is s/p renal biopsy yest PM Diuresing extremely well- BP better- creatinine stable - glucose has been high  02/09 0701 - 02/10 0700 In: 360 [P.O.:360] Out: 6150 [Urine:6150]  Filed Weights   07/07/13 0447 07/08/13 0418 07/09/13 0605  Weight: 88.542 kg (195 lb 3.2 oz) 88.9 kg (195 lb 15.8 oz) 84.1 kg (185 lb 6.5 oz)    Current meds: reviewed  Current Labs: reviewed    Physical Exam:  Blood pressure 140/102, pulse 80, temperature 98.6 F (37 C), temperature source Oral, resp. rate 18, height 5' 5.5" (1.664 m), weight 84.1 kg (185 lb 6.5 oz), SpO2 100.00%. GEN: NAD, standing in room,  ENT: poor dentition, b/l periorbital fullness  EYES: EOMI  CV: RRR, nl s1s2. 2/6 MSM at apex  PULM: CTAB. Nl wob  ABD: obese, s/nt/nd  SKIN: some oozing across shins  EXT:2+ LEE into thighs and dependently; ACE wraps on   Assessment 13F w nephrotic syndrome, progressive CKD, in setting of HCV + VL, +ANA, well controlled HIV, DM2 and hx/o AIHA w/ anemia currenlty. Has hx/o +RPR and TPA. Very broad differential for nephrotic syndrome currently. S/p renal biopsy on 2/9 1. Nephrotic Syndrome, anasarca, hypoalbuminemia. - renal function pretty stable.  Diuresing well on IV lasix, in fact need to back off a little- also on lisinopril.  Differential diagnosis is broad, so s/p renal biopsy on 2/9- likely no results until Thursday.  Serologies appearing negative except positive ANA  On prednisone for autoimmune anemia.   2. HTN/volume- is improving as volume status improves- on lisinopril as well for proteinuria- clonidine and labetalol added- clonidine would probably be the first med I would wean as it may not be practical OP med for  her 3. 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 HIV- on meds, controlled 5. DM- sugar up this AM- may be causing some polyuria as well.    Michelle [name] A  07/09/2013, 11:20 AM   Recent Labs Lab 07/08/13 0536 07/08/13 1600 07/09/13 0340  NA 136* 138 137  K 3.9 4.1 3.9  CL 101 99 99  CO2 20 20 20   GLUCOSE 291* 234* 316*  BUN 64* 63* 66*  CREATININE 2.30* 2.12* 2.24*  CALCIUM 8.2* 8.6 8.0*  PHOS 3.9 4.2 4.1    Recent Labs Lab 07/02/13 1346 07/05/13 2110  07/07/13 0515 07/08/13 0536 07/09/13 0340  WBC 6.6 7.7  < > 6.3 7.5 5.8  NEUTROABS 5.1 5.9  --   --   --   --   HGB 8.6* 6.9*  < > 8.4* 9.2* 8.9*  HCT 25.2* 19.8*  < > 25.0* 26.0* 26.1*  MCV 101.2* 101.5*  < > 98.4 94.9 96.0  PLT 204 181  < > 199 179 222  < > = values in this interval not displayed.

## 2013-07-10 ENCOUNTER — Ambulatory Visit: Payer: Medicaid Other

## 2013-07-10 LAB — RENAL FUNCTION PANEL
Albumin: 1.5 g/dL — ABNORMAL LOW (ref 3.5–5.2)
BUN: 67 mg/dL — ABNORMAL HIGH (ref 6–23)
CO2: 21 meq/L (ref 19–32)
Calcium: 8 mg/dL — ABNORMAL LOW (ref 8.4–10.5)
Chloride: 99 mEq/L (ref 96–112)
Creatinine, Ser: 2.38 mg/dL — ABNORMAL HIGH (ref 0.50–1.10)
GFR calc non Af Amer: 22 mL/min — ABNORMAL LOW (ref >90)
GFR, EST AFRICAN AMERICAN: 25 mL/min — AB (ref >90)
Glucose, Bld: 174 mg/dL — ABNORMAL HIGH (ref 70–99)
Phosphorus: 3.7 mg/dL (ref 2.3–4.6)
Potassium: 4.1 mEq/L (ref 3.7–5.3)
SODIUM: 137 meq/L (ref 137–147)

## 2013-07-10 LAB — GLUCOSE, CAPILLARY
GLUCOSE-CAPILLARY: 489 mg/dL — AB (ref 70–99)
Glucose-Capillary: 161 mg/dL — ABNORMAL HIGH (ref 70–99)
Glucose-Capillary: 296 mg/dL — ABNORMAL HIGH (ref 70–99)
Glucose-Capillary: 388 mg/dL — ABNORMAL HIGH (ref 70–99)

## 2013-07-10 MED ORDER — PREDNISONE 20 MG PO TABS
20.0000 mg | ORAL_TABLET | Freq: Every day | ORAL | Status: DC
  Administered 2013-07-11: 20 mg via ORAL
  Filled 2013-07-10 (×2): qty 1

## 2013-07-10 MED ORDER — OXYCODONE-ACETAMINOPHEN 5-325 MG PO TABS
1.0000 | ORAL_TABLET | Freq: Once | ORAL | Status: AC
  Administered 2013-07-10: 1 via ORAL
  Filled 2013-07-10: qty 1

## 2013-07-10 MED ORDER — FUROSEMIDE 80 MG PO TABS
160.0000 mg | ORAL_TABLET | Freq: Two times a day (BID) | ORAL | Status: DC
  Administered 2013-07-10 – 2013-07-11 (×3): 160 mg via ORAL
  Filled 2013-07-10 (×4): qty 2

## 2013-07-10 MED ORDER — INSULIN ASPART 100 UNIT/ML ~~LOC~~ SOLN
8.0000 [IU] | Freq: Three times a day (TID) | SUBCUTANEOUS | Status: DC
Start: 2013-07-10 — End: 2013-07-10

## 2013-07-10 MED ORDER — CLONIDINE HCL 0.2 MG PO TABS
0.2000 mg | ORAL_TABLET | Freq: Two times a day (BID) | ORAL | Status: DC
  Administered 2013-07-10 – 2013-07-11 (×2): 0.2 mg via ORAL
  Filled 2013-07-10 (×3): qty 1

## 2013-07-10 MED ORDER — OXYCODONE HCL 5 MG PO TABS
10.0000 mg | ORAL_TABLET | Freq: Four times a day (QID) | ORAL | Status: DC | PRN
  Administered 2013-07-10: 10 mg via ORAL
  Filled 2013-07-10: qty 2

## 2013-07-10 MED ORDER — INSULIN ASPART 100 UNIT/ML ~~LOC~~ SOLN
8.0000 [IU] | Freq: Three times a day (TID) | SUBCUTANEOUS | Status: DC
  Administered 2013-07-10 – 2013-07-11 (×5): 8 [IU] via SUBCUTANEOUS

## 2013-07-10 MED ORDER — INSULIN ASPART 100 UNIT/ML ~~LOC~~ SOLN
0.0000 [IU] | Freq: Three times a day (TID) | SUBCUTANEOUS | Status: DC
  Administered 2013-07-10: 20 [IU] via SUBCUTANEOUS
  Administered 2013-07-11: 4 [IU] via SUBCUTANEOUS
  Administered 2013-07-11: 15 [IU] via SUBCUTANEOUS
  Administered 2013-07-11: 11 [IU] via SUBCUTANEOUS

## 2013-07-10 NOTE — Progress Notes (Signed)
CRITICAL VALUE ALERT  Critical value received:  CBG=489  Date of notification:  07/10/13  Time of notification:  1129  Critical value read back: yes  Nurse who received alert:  Junie Panning RN  MD notified (1st page):  Hoffman  Time of first page:  1131AM  MD notified (2nd page):  Time of second page:  Responding MD:  Mikey Bussing  Time MD responded:  1131AM

## 2013-07-10 NOTE — Evaluation (Signed)
Speech Language Pathology Evaluation Patient Details Name: Michelle [name] MRN: [id number] DOB: 05/30/1961 Today's Date: 07/10/2013 Time: 1410-1430 SLP Time Calculation (min): 20 min  Problem List:  Patient Active Problem List   Diagnosis Date Noted  . Acute on chronic renal failure 07/05/2013  . Red eyes 07/01/2013  . HCV (hepatitis C virus) 06/20/2013  . Leg edema 06/20/2013  . Weight gain 06/20/2013  . Nodule of left lung 06/20/2013  . Cavitary lesion of lung 06/20/2013  . Cirrhosis 06/20/2013  . Lung nodule 06/20/2013  . Nephrotic syndrome 06/20/2013  . Shortness of breath 06/19/2013  . CHF exacerbation 06/18/2013  . Acute diastolic heart failure 06/18/2013  . Seizures   . Stroke   . Diabetes mellitus without complication   . HIV (human immunodeficiency virus infection)   . Hypertension   . CKD (chronic kidney disease)   . Lower leg edema 06/12/2013  . Hospital discharge follow-up 06/11/2013  . Pulmonary hypertension 06/01/2013  . Diastolic dysfunction-grade 2 06/01/2013  . Unspecified protein-calorie malnutrition 06/01/2013  . Autoimmune hemolytic anemia 05/31/2013  . Left Wrist arthralgia 05/13/2013  . CKD (chronic kidney disease) stage 3, GFR 30-59 ml/min 05/10/2013  . CVA (cerebral vascular accident) 05/10/2013  . Diabetes 05/10/2013  . Migraines 05/10/2013  . HIV disease 02/16/2013  . Gout 02/16/2013  . Unspecified essential hypertension 02/15/2013  . Seizure 02/14/2013   Past Medical History:  Past Medical History  Diagnosis Date  . Seizures   . Stroke   . Meningitis   . HIV (human immunodeficiency virus infection)   . Hypertension   . Gout   . Muscle spasms of head and/or neck   . CKD (chronic kidney disease)   . CHF (congestive heart failure)     [name]/notes 06/18/2013  . HCV (hepatitis C virus)     chronic/notes 06/18/2013  . Type II diabetes mellitus     [name]/notes 06/18/2013  . AIHA (autoimmune hemolytic anemia)     [name]/notes 06/18/2013  . Hypertensive  encephalopathy ~ 05/2013    hospitalaized/notes 06/18/2013  . Daily headache     "for the last 6 years/notes 06/18/2013  . Exertional shortness of breath     [name]/notes 06/18/2013  . Anxiety     [name]/notes 06/18/2013  . Nephrotic syndrome   . History of syphilis   . High cholesterol    Past Surgical History:  Past Surgical History  Procedure Laterality Date  . Hip pinning Right    HPI:  53 year old female admitted 07/05/13 due to LE swelling and SOB.  PMH significant for sz, CVA, HIV, HCV, DM, AIHA, headaches. SLE ordered due to altered speech.   Assessment / Plan / Recommendation Clinical Impression  Pt communication and cognitive status appears to have returned to baseline.  No further ST intervention recommended at this time.      SLP Assessment  Patient does not need any further Speech Lanaguage Pathology Services    Follow Up Recommendations       Frequency and Duration        Pertinent Vitals/Pain VSS   SLP Goals   n/a  SLP Evaluation Prior Functioning  Cognitive/Linguistic Baseline: Information not available Type of Home: Apartment  Lives With: Alone Available Help at Discharge: Family;Available PRN/intermittently   Cognition  Overall Cognitive Status: Within Functional Limits for tasks assessed    Comprehension  Auditory Comprehension Overall Auditory Comprehension: Appears within functional limits for tasks assessed    Expression Expression Primary Mode of Expression: Verbal Verbal  Expression Overall Verbal Expression: Appears within functional limits for tasks assessed   Oral / Motor Oral Motor/Sensory Function Overall Oral Motor/Sensory Function: Appears within functional limits for tasks assessed Motor Speech Overall Motor Speech: Appears within functional limits for tasks assessed   GO    Celia B. Murvin Natal [facility], CCC-SLP [phone]  Leigh Aurora 07/10/2013, 2:31 PM

## 2013-07-10 NOTE — Care Management Note (Addendum)
  Page 2 of 2   07/11/2013     5:19:25 PM   CARE MANAGEMENT NOTE 07/11/2013  Patient:  Michelle [name], Michelle [name]   Account Number:  [id number]  Date Initiated:  07/10/2013  Documentation initiated by:  Vivian Neuwirth  Subjective/Objective Assessment:   CHF     Action/Plan:   CM to follow for disposition needs   Anticipated DC Date:  07/11/2013   Anticipated DC Plan:  HOME W HOME HEALTH SERVICES         Choice offered to / List presented to:  C-1 Patient        HH arranged  HH-1 RN  HH-8 PCS/PERSONAL CARE SERVICES      [facility] agency  [facility]   Status of service:  Completed, signed off Medicare Important Message given?   (If response is "NO", the following Medicare IM given date fields will be blank) Date Medicare IM given:   Date Additional Medicare IM given:    Discharge Disposition:  HOME W HOME HEALTH SERVICES  Per UR Regulation:  Reviewed for med. necessity/level of care/duration of stay  If discussed at Long Length of Stay Meetings, dates discussed:    Comments:  07/11/2013 PT RECS:  PT added to [facility] recs. DME Recs:  [facility] Patient request BSC be shipped to patients home. HH Order faxed to [facility] at 5:10pm Nashon Erbes RN, BSN, MSHL, CCM 07/11/2013 5:15pm  07/11/2013 MCD Only Certification Disposition Plan:  Home with HHS: RN,CNA ([facility] notified) ADD: today Aaryanna Hyden RN, BSN, MSHL, CCM 07/11/2013    07/10/2013 Hx/o d/c home with [facility] services on previous admissiion 07/02/2013 and was d/c from services 06/29/2013; readmitted to hospital 07/05/2013 [facility] notifed of admission and need for OP f/u. [facility] will contact patient for introduction of services. Disposition Plan:  Home with HHS: RN, CNA ([facility] notified) ADD: tomorrow am Jacobs Golab RN, BSN, MSHL, CCM 07/10/2013   3E07  15 day bounce back from 2/3  Called to Dr. Jacky Kindle 2nd related / unrelated/unpreventable. CHF exacerbation / ARF 2/6 Hx/o multiple  co-morbiites OP intervention: increased Lasix with good diuresis Previously d/c with HHS: RN, PT, CNA Bilateral leg swelling, pain, weeping, itching.  HBP this admission 200-210/90 - now improved. Renal Bx 2/9 results pending 2/12. Creat stablized.  Planned d/c 2/12 if tols po Lasix. Compliant with multiple appts 1/23, 1/28, 1/29, 2/2 [employer] RN, BSN, [place], [place] 07/10/2013 3:44

## 2013-07-10 NOTE — Progress Notes (Signed)
Case discussed with Dr. Sadek at time of visit.  We reviewed the resident's history and exam and pertinent patient test results.  I agree with the assessment, diagnosis, and plan of care documented in the resident's note. 

## 2013-07-10 NOTE — Progress Notes (Signed)
Internal Medicine Teaching Service: Daily Progress Note  Subjective: Patient very interactive today, reports HA has resolved.  Denies any acute complaints and reports she is very impressed with the improvement in swelling of her lower extremities. Objective: Vital signs in last 24 hours: Filed Vitals:   07/09/13 2017 07/09/13 2333 07/10/13 0553 07/10/13 1021  BP: 167/66 168/75 137/74 140/69  Pulse: 62 68 61 72  Temp: 98.5 F (36.9 C)  98 F (36.7 C)   TempSrc: Oral  Oral   Resp: 18  18   Height:      Weight:   184 lb 1.6 oz (83.507 kg)   SpO2: 98%  100%    Weight change: -1 lb 4.9 oz (-0.593 kg)  Intake/Output Summary (Last 24 hours) at 07/10/13 1354 Last data filed at 07/10/13 1351  Gross per 24 hour  Intake    840 ml  Output   3625 ml  Net  -2785 ml   General: resting in bed, NAD Cardiac: RRR, no rubs, murmurs or gallops Pulm: clear to auscultation bilaterally, moving normal volumes of air Abd: soft, nontender, nondistended, BS present Ext: 2+ pitting edema to knee b/l, less swollen, anterior ulcers improved, less purulent discharge. Neuro: AAOx4  Lab Results: Basic Metabolic Panel:  Recent Labs Lab 07/09/13 1630 07/10/13 0424  NA 132* 137  K 4.7 4.1  CL 95* 99  CO2 20 21  GLUCOSE 517* 174*  BUN 68* 67*  CREATININE 2.46* 2.38*  CALCIUM 8.1* 8.0*  PHOS 3.7 3.7   Liver Function Tests:  Recent Labs Lab 07/05/13 2110 07/05/13 2354  07/07/13 2055  07/09/13 1630 07/10/13 0424  AST 40*  --   --  34  --   --   --   ALT 47*  --   --  42*  --   --   --   ALKPHOS 249*  --   --  336*  --   --   --   BILITOT 2.5* 2.3*  --  1.3*  --   --   --   PROT 5.8*  --   --  6.1  --   --   --   ALBUMIN 1.5*  --   < > 1.6*  < > 1.7* 1.5*  < > = values in this interval not displayed. CBC:  Recent Labs Lab 07/05/13 2110  07/08/13 0536 07/09/13 0340  WBC 7.7  < > 7.5 5.8  NEUTROABS 5.9  --   --   --   HGB 6.9*  < > 9.2* 8.9*  HCT 19.8*  < > 26.0* 26.1*  MCV 101.5*   < > 94.9 96.0  PLT 181  < > 179 222  < > = values in this interval not displayed. Cardiac Enzymes:  Recent Labs Lab 07/05/13 2110 07/05/13 2359 07/06/13 0525  TROPONINI <0.30 <0.30 <0.30   BNP: No results found for this basename: PROBNP,  in the last 168 hours CBG:  Recent Labs Lab 07/09/13 0717 07/09/13 1114 07/09/13 1630 07/09/13 2104 07/10/13 0603 07/10/13 1126  GLUCAP 204* 490* 504* 285* 161* 489*   Coagulation:  Recent Labs Lab 07/05/13 2110  LABPROT 11.7  INR 0.87   Anemia Panel:  Recent Labs Lab 07/05/13 2354  RETICCTPCT 1.9   Urine Drug Screen: Drugs of Abuse     Component Value Date/Time   LABOPIA POSITIVE* 05/28/2013 1737   COCAINSCRNUR NONE DETECTED 05/28/2013 1737   LABBENZ NONE DETECTED 05/28/2013 1737   AMPHETMU NONE DETECTED  05/28/2013 1737   THCU NONE DETECTED 05/28/2013 1737   LABBARB NONE DETECTED 05/28/2013 1737    Alcohol Level: No results found for this basename: ETH,  in the last 168 hours Urinalysis:  Recent Labs Lab 07/05/13 2352  COLORURINE YELLOW  LABSPEC 1.016  PHURINE 5.5  GLUCOSEU 100*  HGBUR TRACE*  BILIRUBINUR SMALL*  KETONESUR NEGATIVE  PROTEINUR >300*  UROBILINOGEN 1.0  NITRITE NEGATIVE  LEUKOCYTESUR NEGATIVE    Micro Results: Recent Results (from the past 240 hour(s))  URINE CULTURE     Status: None   Collection Time    07/02/13  5:12 PM      Result Value Ref Range Status   Specimen Description URINE, CLEAN CATCH   Final   Special Requests Immunocompromised   Final   Culture  Setup Time     Final   Value: 07/02/2013 19:28     Performed at [facility] Count     Final   Value: 40,000 COLONIES/ML     Performed at [facility]   Culture     Final   Value: Multiple bacterial morphotypes present, none predominant. Suggest appropriate recollection if clinically indicated.     Performed at [facility]   Report Status 07/03/2013 FINAL   Final  URINE CULTURE     Status:  None   Collection Time    07/05/13 11:52 PM      Result Value Ref Range Status   Specimen Description URINE, RANDOM   Final   Special Requests Immunocompromised   Final   Culture  Setup Time     Final   Value: 07/06/2013 06:13     Performed at [facility]   Colony Count     Final   Value: 45,000 COLONIES/ML     Performed at [facility]   Culture     Final   Value: Multiple bacterial morphotypes present, none predominant. Suggest appropriate recollection if clinically indicated.     Performed at [facility]   Report Status 07/07/2013 FINAL   Final   Studies/Results: Ct Head Wo Contrast  07/09/2013   CLINICAL DATA:  Confusion  EXAM: CT HEAD WITHOUT CONTRAST  TECHNIQUE: Contiguous axial images were obtained from the base of the skull through the vertex without intravenous contrast.  COMPARISON:  May 28, 2013  FINDINGS: Moderate generalized atrophy is stable. There is no mass, hemorrhage, extra-axial fluid collection, or midline shift. There is patchy small vessel disease in the centra semiovale bilaterally. Gray-white compartments are otherwise normal. No demonstrable acute infarct. Bony calvarium appears intact. The mastoid air cells are clear. There is mild debris in the left external auditory canal.  IMPRESSION: Atrophy with patchy periventricular small vessel disease. Probable cerumen in the left external auditory canal. Study otherwise unremarkable.   Electronically Signed   By: Bretta Bang M.D.   On: 07/09/2013 11:13   Mr Brain Wo Contrast  07/09/2013   CLINICAL DATA:  Acute encephalopathy.  Rule out stroke.  EXAM: MRI HEAD WITHOUT CONTRAST  TECHNIQUE: Multiplanar, multiecho pulse sequences of the brain and surrounding structures were obtained without intravenous contrast.  COMPARISON:  Head CT 07/09/2013 and brain MRI 05/10/2013  FINDINGS: Mildly heterogeneous marrow signal in the clivus is unchanged. There is no evidence of acute infarct. Patchy T2  hyperintensities within the subcortical and deep cerebral white matter do not appear significantly changed and are nonspecific but compatible with mild chronic small vessel ischemic disease. There is  no evidence of intracranial hemorrhage. There is moderate cerebral atrophy, mildly advanced for age and unchanged. There is no evidence of mass, midline shift, or extra-axial fluid collection. Major intracranial vascular flow voids are unremarkable. Orbits are normal. Paranasal sinuses and mastoid air cells are clear.  IMPRESSION: Unchanged appearance of the brain with mild chronic small vessel ischemic disease and mildly advanced cerebral atrophy for age. No evidence of acute intracranial abnormality.   Electronically Signed   By: Sebastian Ache   On: 07/09/2013 19:02   US Biopsy  07/08/2013   INDICATION: History of HIV, hepatitis-C, diabetes, now with acute on chronic renal insufficiency and nephrotic syndrome  EXAM: ULTRASOUND GUIDED RENAL BIOPSY  MEDICATIONS: Fentanyl 50 mcg IV; Versed 1 mg IV  ANESTHESIA/SEDATION: Total Moderate Sedation time  15 minutes  COMPARISON:  CT CHEST W/O CM dated 06/19/2013; US ABDOMEN COMPLETE dated 06/19/2013  PROCEDURE: Informed written consent was obtained from the patient after a discussion of the risks, benefits and alternatives to treatment. The patient understands and consents the procedure. A timeout was performed prior to the initiation of the procedure.  Ultrasound scanning was performed of the bilateral flanks. The inferior pole of the left kidney was selected for biopsy due to location and sonographic window. The procedure was planned. The operative site was prepped and draped in the usual sterile fashion. The overlying soft tissues were anesthetized with 1% lidocaine with epinephrine. A 17 gauge core needle biopsy device was advanced into the inferior cortex of the left kidney and 3 core biopsies were obtained under direct ultrasound guidance. Real time pathologic review  confirmed adequate tissue acquisition. Images were saved for documentation purposes. The biopsy device was removed and hemostasis was obtained with manual compression. Post procedural scanning was negative for significant post procedural hemorrhage or additional complication. A dressing was placed. The patient tolerated the procedure well without immediate post procedural complication.  COMPLICATIONS: None immediate  IMPRESSION: Technically successful ultrasound guided left renal biopsy.   Electronically Signed   By: Simonne Come M.D.   On: 07/08/2013 16:36   Medications: I have reviewed the patient's current medications. Scheduled Meds: . abacavir  600 mg Oral Daily  . acyclovir  800 mg Oral Daily  . aspirin EC  325 mg Oral Daily  . cloNIDine  0.2 mg Oral BID  . Darunavir Ethanolate  800 mg Oral Q breakfast  . furosemide  160 mg Oral BID  . insulin aspart  0-20 Units Subcutaneous TID WC  . insulin aspart  0-5 Units Subcutaneous QHS  . insulin aspart  8 Units Subcutaneous TID WC  . insulin glargine  60 Units Subcutaneous QHS  . labetalol  100 mg Oral BID  . lamiVUDine  100 mg Oral Daily  . levETIRAcetam  1,500 mg Oral Q12H  . lisinopril  20 mg Oral Daily  . pantoprazole  40 mg Oral Daily  . phenytoin  100 mg Oral TID  . potassium chloride  20 mEq Oral Daily  . [START ON 07/11/2013] predniSONE  20 mg Oral Q breakfast  . ritonavir  100 mg Oral Q breakfast   Continuous Infusions:  PRN Meds:. Assessment/Plan:  53 year old woman, with past medical history of dCHF (EF normal, with Grade-2 diastolic dysfunction), HIV (CD4 490 and VL<20 on 05/07/13), chronic HCV, seizure disorder, hx of stroke, HTN, T2DM, CKD, gout, and AIHA, who is admitted through the [facility] for inpatient diuretic therapy. Her outpatient diuretic therapy has been complicated with a raising creatinine and increasing bilateral lower  extremity edema with fluid drainage from her legs.  #Acute encephalopathy- resolved -CT and MRI head  showed no acute changes. Patient's mental status has improved with improvement of her HA as well as   # Acute on Chronic Renal Failure vs Progression of CKD with Nephrotic syndrome: Improving.  - Lisinopril due to massive proteinuria (decreased dose to 20mg  per renal) -appreciate renal consult.  -Avoid NSAID, IV contrast - Renal Biopsy pending (can follow up with Renal as outpatient) - Change Diuretic to PO, d/c foley and monitor overnight, if tolerates possible discharge tomorrow.   # Chronic Heart Failure with Preserved EF. -Last echo 05/31/13, grade 2 DD.   - Net 13 L neg since admission, weight down ~20 lbs. - Lasix 160 PO BID  # HTN - on ACEi - Continue labetalol - decrease clonidine 0.2 mg twice a day. -Lasix as above  # Autoimmune hemolytic anemia: Etiology unclear. Patient was seen by Dr Rosie Fate of hematology on -06/27/2013. AHIA thought to be medication induced secondary to ceftriaxone. Had on prednisone daily Plan. -Will decrease prednisone to 20mg  in anticipation of discharge. - Daily CBCs   # Diabetes: hyperglycemia improved this AM. Plan - Lantus 60 units QHS -SSI resistant   - cont with HS coverage - Novolog 8 units with meals   # HIV (human immunodeficiency virus infection).  - Well controlled continue home medications, epivir decreased to 100mg  daily yesterday due to GFR.   # HCV (hepatitis C virus) Possible Cirrhosis - May be candidate for Tx at [facility]  Diet Renal Code Status: Full DVT PPx: SCDs  Dispo: Disposition is deferred at this time, awaiting improvement of current medical problems.  Likely discharge tomorrow if tolerates PO dieretics. Will need to follow up with Nephrology for renal biopsy results.   The patient does have a current PCP Christen Bame, MD) and does need an [facility] hospital follow-up appointment after discharge.  The patient does not have transportation limitations that hinder transportation to clinic appointments.  .Services Needed at time of  discharge: Y = Yes, Blank = No PT:   OT:   RN:   Equipment:   Other:     LOS: 5 days   Carlynn Purl, DO 07/10/2013, 1:54 PM

## 2013-07-10 NOTE — Progress Notes (Signed)
  Date: 07/10/2013  Patient name: Michelle [name]  Medical record number: [id number]  Date of birth: June 06, 1960   This patient has been seen and the plan of care was discussed with the house staff. Please see their note for complete details. I concur with their findings with the following additions/corrections:  Patient is [facility] more lucid today. She is completely oriented. Her MRI brain was negative for acute CNS pathology.  She believes that yesterday was an isolated event due to excess sedation perhaps. She has never had that severe a HA before nor had such problems with either apparent receptive or expressive aphasia in the past with TIA, CVA or migraine  She is on oral diuretics. Biopsy back tomorrow at earliest per Nephrology.   Still having some issues with BG that we will address.   Randall Hiss, MD 07/10/2013, 12:09 PM

## 2013-07-10 NOTE — Progress Notes (Signed)
Report given to receiving RN. Patient is in bed resting and watching TV. No verbal complaints and no signs or symptoms of distress or discomfort.  

## 2013-07-10 NOTE — Progress Notes (Signed)
Pt. Resting during the night. No s/s of distress noted. Pt. C/o pain 7/10 to her feet. PRN pain medication administered and effective. Pt. Currently resting in bed quietly. Call light within reach. RN will continue to moinitor pt. For changes in condition.Gloriann Riede, Cheryll Dessert

## 2013-07-10 NOTE — Progress Notes (Signed)
UR completed Xzayvier Fagin K. Chanele Douglas, RN, BSN, MSHL, CCM  07/10/2013 4:41 PM

## 2013-07-10 NOTE — Progress Notes (Signed)
Inpatient Diabetes Program Recommendations  AACE/ADA: New Consensus Statement on Inpatient Glycemic Control (2013)  Target Ranges:  Prepandial:   less than 140 mg/dL      Peak postprandial:   less than 180 mg/dL (1-2 hours)      Critically ill patients:  140 - 180 mg/dL   Reason for Visit: Results for Michelle [name], Michelle [name] (MRN [id number]) as of 07/10/2013 12:58  Ref. Range 07/09/2013 11:14 07/09/2013 16:30 07/09/2013 21:04 07/10/2013 06:03 07/10/2013 11:26  Glucose-Capillary Latest Range: 70-99 mg/dL 237 (H) 628 (H) 315 (H) 161 (H) 489 (H)    Note that Prednisone continues to be tapered.  Agree with increased Novolog meal coverage and correction.  Will follow. Beryl Meager, RN, BC-ADM Inpatient Diabetes Coordinator Pager [phone]

## 2013-07-10 NOTE — Progress Notes (Signed)
Admit: 07/05/2013 LOS: 5  89F w nephrotic syndrome, progressive CKD, in setting of HCV + VL, +ANA, well controlled HIV, DM2 and hx/o AIHA w/ anemia currenlty. Has hx/o +RPR and TPA. Very broad differential for nephrotic syndrome currently. S/p renal biopsy on 2/9   Subjective:   Diuresing extremely well-still.   BP better- creatinine stable - glucose better also  02/10 0701 - 02/11 0700 In: 840 [P.O.:840] Out: 3125 [Urine:3125]  Filed Weights   07/08/13 0418 07/09/13 0605 07/10/13 0553  Weight: 88.9 kg (195 lb 15.8 oz) 84.1 kg (185 lb 6.5 oz) 83.507 kg (184 lb 1.6 oz)    Current meds: reviewed  Current Labs: reviewed    Physical Exam:  Blood pressure 140/69, pulse 72, temperature 98 F (36.7 C), temperature source Oral, resp. rate 18, height 5' 5.5" (1.664 m), weight 83.507 kg (184 lb 1.6 oz), SpO2 100.00%. GEN: NAD, standing in room,  ENT: poor dentition, b/l periorbital fullness  EYES: EOMI  CV: RRR, nl s1s2. 2/6 MSM at apex  PULM: CTAB. Nl wob  ABD: obese, s/nt/nd  SKIN: some oozing across shins  EXT:2+ LEE into thighs and dependently; ACE wraps on   Assessment 89F w nephrotic syndrome, progressive CKD, in setting of HCV + VL, +ANA, well controlled HIV, DM2 and hx/o AIHA w/ anemia currenlty. Has hx/o +RPR and TPA. Very broad differential for nephrotic syndrome currently. S/p renal biopsy on 2/9 1. Nephrotic Syndrome, anasarca, hypoalbuminemia. - renal function still pretty stable.  Diuresing well on IV lasix, has diuresed quite well, weight down 10 kg if you take the top weight and weight from today,  also on lisinopril.  I will change her over to PO lasix , d/c foley and continue to follow. Differential diagnosis for nephrotic syndrome  is broad, so s/p renal biopsy on 2/9- likely no results until Thursday.  Serologies appearing negative except positive ANA  On prednisone for autoimmune anemia.   2. HTN/volume- is improving as volume status improves- on lisinopril as well for  proteinuria- clonidine and labetalol as well- clonidine would probably be the first med I would wean as it may not be practical OP med for her.  I am going to decrease dose of clonidine today to 0.2 mg 3. 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 HIV- on meds, controlled 5. DM- sugar better 6. Dispo- we probably wont have bx results until Thursday PM- if she continues to diurese well on PO lasix and creatinine stable tomorrow AM- could probably be discharged in AM from my standpoint and follow up as OP- because creatinine is stable she will likely not need emergent tx that would need to be administered in an inpt setting   Tyrez Berrios A  07/10/2013, 10:30 AM   Recent Labs Lab 07/09/13 0340 07/09/13 1630 07/10/13 0424  NA 137 132* 137  K 3.9 4.7 4.1  CL 99 95* 99  CO2 20 20 21   GLUCOSE 316* 517* 174*  BUN 66* 68* 67*  CREATININE 2.24* 2.46* 2.38*  CALCIUM 8.0* 8.1* 8.0*  PHOS 4.1 3.7 3.7    Recent Labs Lab 07/05/13 2110  07/07/13 0515 07/08/13 0536 07/09/13 0340  WBC 7.7  < > 6.3 7.5 5.8  NEUTROABS 5.9  --   --   --   --   HGB 6.9*  < > 8.4* 9.2* 8.9*  HCT 19.8*  < > 25.0*  26.0* 26.1*  MCV 101.5*  < > 98.4 94.9 96.0  PLT 181  < > 199 179 222  < > = values in this interval not displayed.

## 2013-07-11 ENCOUNTER — Other Ambulatory Visit: Payer: Medicaid Other

## 2013-07-11 LAB — CBC
HEMATOCRIT: 31.7 % — AB (ref 36.0–46.0)
HEMOGLOBIN: 10.8 g/dL — AB (ref 12.0–15.0)
MCH: 33.2 pg (ref 26.0–34.0)
MCHC: 34.1 g/dL (ref 30.0–36.0)
MCV: 97.5 fL (ref 78.0–100.0)
PLATELETS: 289 10*3/uL (ref 150–400)
RBC: 3.25 MIL/uL — AB (ref 3.87–5.11)
RDW: 16.9 % — AB (ref 11.5–15.5)
WBC: 8.3 10*3/uL (ref 4.0–10.5)

## 2013-07-11 LAB — GLUCOSE, CAPILLARY
Glucose-Capillary: 183 mg/dL — ABNORMAL HIGH (ref 70–99)
Glucose-Capillary: 277 mg/dL — ABNORMAL HIGH (ref 70–99)
Glucose-Capillary: 324 mg/dL — ABNORMAL HIGH (ref 70–99)

## 2013-07-11 LAB — RENAL FUNCTION PANEL
ALBUMIN: 2 g/dL — AB (ref 3.5–5.2)
BUN: 74 mg/dL — ABNORMAL HIGH (ref 6–23)
CALCIUM: 8.1 mg/dL — AB (ref 8.4–10.5)
CHLORIDE: 96 meq/L (ref 96–112)
CO2: 22 mEq/L (ref 19–32)
Creatinine, Ser: 2.38 mg/dL — ABNORMAL HIGH (ref 0.50–1.10)
GFR calc Af Amer: 25 mL/min — ABNORMAL LOW (ref >90)
GFR, EST NON AFRICAN AMERICAN: 22 mL/min — AB (ref >90)
Glucose, Bld: 213 mg/dL — ABNORMAL HIGH (ref 70–99)
Phosphorus: 4 mg/dL (ref 2.3–4.6)
Potassium: 4 mEq/L (ref 3.7–5.3)
Sodium: 135 mEq/L — ABNORMAL LOW (ref 137–147)

## 2013-07-11 LAB — APTT: aPTT: 25 seconds (ref 24–37)

## 2013-07-11 LAB — PROTIME-INR
INR: 0.91 (ref 0.00–1.49)
PROTHROMBIN TIME: 12.1 s (ref 11.6–15.2)

## 2013-07-11 MED ORDER — PREDNISONE 10 MG PO TABS: ORAL_TABLET | ORAL | Status: DC

## 2013-07-11 MED ORDER — LISINOPRIL 20 MG PO TABS: 20.0000 mg | ORAL_TABLET | Freq: Every day | ORAL | Status: DC

## 2013-07-11 MED ORDER — PHENYTOIN 50 MG PO CHEW: 200.0000 mg | CHEWABLE_TABLET | Freq: Two times a day (BID) | ORAL | Status: DC

## 2013-07-11 MED ORDER — FUROSEMIDE 80 MG PO TABS: 160.0000 mg | ORAL_TABLET | Freq: Two times a day (BID) | ORAL | Status: DC

## 2013-07-11 NOTE — Progress Notes (Signed)
Internal Medicine Teaching Service: Daily Progress Note  Subjective: Patient is excited to go home today, she reports that she feels very well and wants to go home. Of note, nursing discovered an area of ecchymosis at her right axilla, overnight team was called and marked the area.  Patient does not recall in falls or trauma to the area, does not have a history of easy bleeding.  Denies any pain or tenderness of area. Objective: Vital signs in last 24 hours: Filed Vitals:   07/10/13 2145 07/10/13 2146 07/11/13 0507 07/11/13 1047  BP: 171/85 171/85 166/71 169/80  Pulse:  65 58 69  Temp:  98.3 F (36.8 C) 97.9 F (36.6 C)   TempSrc:  Oral Oral   Resp:  18 18   Height:      Weight:   182 lb 1.6 oz (82.6 kg)   SpO2:  100% 100%    Weight change: -2 lb (-0.907 kg)  Intake/Output Summary (Last 24 hours) at 07/11/13 1208 Last data filed at 07/11/13 0814  Gross per 24 hour  Intake   1440 ml  Output    950 ml  Net    490 ml   General: sitting at edge of bed, Cardiac: RRR, no rubs, murmurs or gallops Pulm: clear to auscultation bilaterally, moving normal volumes of air Abd: soft, nontender, nondistended, BS present MSK: ~10 cm area of ecchymosis at right axilla.  Non tender to palpation, no fluctuance or induration, no warmth. Ext: 2+ pitting edema to knee b/l, less swollen, bandages to lower extremities c/d/i Neuro: AAOx4  Lab Results: Basic Metabolic Panel:  Recent Labs Lab 07/10/13 0424 07/11/13 0701  NA 137 135*  K 4.1 4.0  CL 99 96  CO2 21 22  GLUCOSE 174* 213*  BUN 67* 74*  CREATININE 2.38* 2.38*  CALCIUM 8.0* 8.1*  PHOS 3.7 4.0   Liver Function Tests:  Recent Labs Lab 07/05/13 2110 07/05/13 2354  07/07/13 2055  07/10/13 0424 07/11/13 0701  AST 40*  --   --  34  --   --   --   ALT 47*  --   --  42*  --   --   --   ALKPHOS 249*  --   --  336*  --   --   --   BILITOT 2.5* 2.3*  --  1.3*  --   --   --   PROT 5.8*  --   --  6.1  --   --   --   ALBUMIN 1.5*   --   < > 1.6*  < > 1.5* 2.0*  < > = values in this interval not displayed. CBC:  Recent Labs Lab 07/05/13 2110  07/09/13 0340 07/11/13 1018  WBC 7.7  < > 5.8 8.3  NEUTROABS 5.9  --   --   --   HGB 6.9*  < > 8.9* 10.8*  HCT 19.8*  < > 26.1* 31.7*  MCV 101.5*  < > 96.0 97.5  PLT 181  < > 222 289  < > = values in this interval not displayed. Cardiac Enzymes:  Recent Labs Lab 07/05/13 2110 07/05/13 2359 07/06/13 0525  TROPONINI <0.30 <0.30 <0.30   BNP: No results found for this basename: PROBNP,  in the last 168 hours CBG:  Recent Labs Lab 07/10/13 0603 07/10/13 1126 07/10/13 1703 07/10/13 2346 07/11/13 0621 07/11/13 1118  GLUCAP 161* 489* 388* 296* 183* 277*   Coagulation:  Recent Labs Lab 07/05/13 2110  07/11/13 1018  LABPROT 11.7 12.1  INR 0.87 0.91   Anemia Panel:  Recent Labs Lab 07/05/13 2354  RETICCTPCT 1.9   Urine Drug Screen: Drugs of Abuse     Component Value Date/Time   LABOPIA POSITIVE* 05/28/2013 1737   COCAINSCRNUR NONE DETECTED 05/28/2013 1737   LABBENZ NONE DETECTED 05/28/2013 1737   AMPHETMU NONE DETECTED 05/28/2013 1737   THCU NONE DETECTED 05/28/2013 1737   LABBARB NONE DETECTED 05/28/2013 1737    Alcohol Level: No results found for this basename: ETH,  in the last 168 hours Urinalysis:  Recent Labs Lab 07/05/13 2352  COLORURINE YELLOW  LABSPEC 1.016  PHURINE 5.5  GLUCOSEU 100*  HGBUR TRACE*  BILIRUBINUR SMALL*  KETONESUR NEGATIVE  PROTEINUR >300*  UROBILINOGEN 1.0  NITRITE NEGATIVE  LEUKOCYTESUR NEGATIVE    Micro Results: Recent Results (from the past 240 hour(s))  URINE CULTURE     Status: None   Collection Time    07/02/13  5:12 PM      Result Value Ref Range Status   Specimen Description URINE, CLEAN CATCH   Final   Special Requests Immunocompromised   Final   Culture  Setup Time     Final   Value: 07/02/2013 19:28     Performed at [facility]   Colony Count     Final   Value: 40,000  COLONIES/ML     Performed at [facility]   Culture     Final   Value: Multiple bacterial morphotypes present, none predominant. Suggest appropriate recollection if clinically indicated.     Performed at [facility]   Report Status 07/03/2013 FINAL   Final  URINE CULTURE     Status: None   Collection Time    07/05/13 11:52 PM      Result Value Ref Range Status   Specimen Description URINE, RANDOM   Final   Special Requests Immunocompromised   Final   Culture  Setup Time     Final   Value: 07/06/2013 06:13     Performed at [facility]   Colony Count     Final   Value: 45,000 COLONIES/ML     Performed at [facility]   Culture     Final   Value: Multiple bacterial morphotypes present, none predominant. Suggest appropriate recollection if clinically indicated.     Performed at [facility]   Report Status 07/07/2013 FINAL   Final   Studies/Results: Mr Brain Wo Contrast  07/09/2013   CLINICAL DATA:  Acute encephalopathy.  Rule out stroke.  EXAM: MRI HEAD WITHOUT CONTRAST  TECHNIQUE: Multiplanar, multiecho pulse sequences of the brain and surrounding structures were obtained without intravenous contrast.  COMPARISON:  Head CT 07/09/2013 and brain MRI 05/10/2013  FINDINGS: Mildly heterogeneous marrow signal in the clivus is unchanged. There is no evidence of acute infarct. Patchy T2 hyperintensities within the subcortical and deep cerebral white matter do not appear significantly changed and are nonspecific but compatible with mild chronic small vessel ischemic disease. There is no evidence of intracranial hemorrhage. There is moderate cerebral atrophy, mildly advanced for age and unchanged. There is no evidence of mass, midline shift, or extra-axial fluid collection. Major intracranial vascular flow voids are unremarkable. Orbits are normal. Paranasal sinuses and mastoid air cells are clear.  IMPRESSION: Unchanged appearance of the brain with mild chronic  small vessel ischemic disease and mildly advanced cerebral atrophy for age. No evidence of acute intracranial abnormality.   Electronically  Signed   By: Sebastian Ache   On: 07/09/2013 19:02   Medications: I have reviewed the patient's current medications. Scheduled Meds: . abacavir  600 mg Oral Daily  . acyclovir  800 mg Oral Daily  . aspirin EC  325 mg Oral Daily  . cloNIDine  0.2 mg Oral BID  . Darunavir Ethanolate  800 mg Oral Q breakfast  . furosemide  160 mg Oral BID  . insulin aspart  0-20 Units Subcutaneous TID WC  . insulin aspart  0-5 Units Subcutaneous QHS  . insulin aspart  8 Units Subcutaneous TID WC  . insulin glargine  60 Units Subcutaneous QHS  . labetalol  100 mg Oral BID  . lamiVUDine  100 mg Oral Daily  . levETIRAcetam  1,500 mg Oral Q12H  . lisinopril  20 mg Oral Daily  . pantoprazole  40 mg Oral Daily  . phenytoin  100 mg Oral TID  . potassium chloride  20 mEq Oral Daily  . predniSONE  20 mg Oral Q breakfast  . ritonavir  100 mg Oral Q breakfast   Continuous Infusions:  PRN Meds:. Assessment/Plan:  53 year old woman, with past medical history of dCHF (EF normal, with Grade-2 diastolic dysfunction), HIV (CD4 490 and VL<20 on 05/07/13), chronic HCV, seizure disorder, hx of stroke, HTN, T2DM, CKD, gout, and AIHA, who is admitted through the [facility] for inpatient diuretic therapy. Her outpatient diuretic therapy has been complicated with a raising creatinine and increasing bilateral lower extremity edema with fluid drainage from her legs.  Right Axilla ecchymosis - Patient not on A/C or antiplatelet therapy. No history of bleeding disorder or easy bruseing. Patient has been afebrile, area is not warm or tender.  A Stat CBC, PT/INR, and aPTT was obtained and were all WNL (Hgb improved to 10.8).  No indication for imaging at this time.  Given patient's history of AMS I am concerned she may have fallen or had other trauma to the area, and am unsure she is safe for discharge at  to home without assistance. - Request PT/OT consult and recommendations.  # Acute on Chronic Renal Failure vs Progression of CKD with Nephrotic syndrome: Improving.  - Lisinopril due to massive proteinuria (decreased dose to 20mg  per renal) -appreciate renal consult.  -Avoid NSAID, IV contrast - Renal Biopsy pending (can follow up with Renal as outpatient) - Patient tolerated PO therapy, can be discharged from nephrology standpoint. and follow up with Dr. Keitha Butte at appointment on 2/20.   # Chronic Heart Failure with Preserved EF. -Last echo 05/31/13, grade 2 DD.   - Net 13 L neg since admission, weight down ~20 lbs. - Lasix 160 PO BID  # HTN - on ACEi - Continue labetalol - decrease clonidine 0.2 mg twice a day. -Lasix as above  # Autoimmune hemolytic anemia: Etiology unclear. Patient was seen by Dr Rosie Fate of hematology on -06/27/2013. AHIA thought to be medication induced secondary to ceftriaxone. Had on prednisone daily Plan. -prednisone to 20mg    # Diabetes: hyperglycemia improved, Note A1c of 5 in December likely artificially low due to RBC destruction from Hemolytic anemia. Plan - Lantus 60 units QHS -SSI resistant   - cont with HS coverage - Novolog 8 units with meals   # HIV (human immunodeficiency virus infection).  - Well controlled continue home medications, epivir decreased to 100mg  daily yesterday due to GFR.   # HCV (hepatitis C virus) Possible Cirrhosis - May be candidate for Tx at [facility]  Diet Renal  Code Status: Full DVT PPx: SCDs  Dispo: Disposition is deferred at this time, will await PT.OT eval and recommendations.  The patient does have a current PCP Christen Bame, MD) and does need an [facility] hospital follow-up appointment after discharge.  The patient does not have transportation limitations that hinder transportation to clinic appointments.  .Services Needed at time of discharge: Y = Yes, Blank = No PT:   OT:   RN:   Equipment:   Other:     LOS: 6  days   Carlynn Purl, DO 07/11/2013, 12:08 PM

## 2013-07-11 NOTE — Evaluation (Signed)
Physical Therapy Evaluation Patient Details Name: Michelle [name] MRN: [id number] DOB: 1961/04/14 Today's Date: 07/11/2013 Time: 5284-1324 PT Time Calculation (min): 20 min  PT Assessment / Plan / Recommendation History of Present Illness  This is a 53 year old woman with PMH of CHF (EF normal, with Grade-2 diastolic dysfunction), HIV, seizure disorder, hx of stroke, HTN, T2DM, CKD, gout, who was admitted with bilateral leg swelling, shortness of breath and weight gain x 5 days  Clinical Impression  Patient presents with decreased mobility due to deficits listed below.  Feel she can function at home with assist of her cousin who has basically been providing PCS services without compensation for patient at home.  Will benefit from HHPT to address issues and maximize safety at home.  Patient planned to d/c home today so no further acute PT needs at this time.    PT Assessment  All further PT needs can be met in the next venue of care    Follow Up Recommendations  Home health PT          Equipment Recommendations  3in1 (PT)          Precautions / Restrictions Precautions Precautions: Fall Precaution Comments: reports last fall months ago   Pertinent Vitals/Pain No current pain complaints      Mobility  Bed Mobility Overal bed mobility: Modified Independent Transfers Overall transfer level: Modified independent Equipment used: None General transfer comment: wide base of support and incr time Ambulation/Gait Ambulation/Gait assistance: Independent Ambulation Distance (Feet): 25 Feet Assistive device: None Gait Pattern/deviations: Wide base of support;Decreased stride length;Shuffle General Gait Details: moving about in room independent to/from bathroom        PT Diagnosis: Abnormality of gait  PT Problem List: Decreased balance;Decreased activity tolerance;Decreased strength PT Treatment Interventions:       PT Goals(Current goals can be found in the care plan  section) Acute Rehab PT Goals PT Goal Formulation: No goals set, d/c therapy  Visit Information  Last PT Received On: 07/11/13 Assistance Needed: +1 History of Present Illness: This is a 53 year old woman with PMH of CHF (EF normal, with Grade-2 diastolic dysfunction), HIV, seizure disorder, hx of stroke, HTN, T2DM, CKD, gout, who was admitted with bilateral leg swelling, shortness of breath and weight gain x 5 days       Prior [place] expects to be discharged to:: Private residence Living Arrangements: Alone Available Help at Discharge: Family;Available PRN/intermittently Type of Home: Apartment Home Access: Level entry Home Layout: One level Home Equipment: Cane - single point;Grab bars - tub/shower;Grab bars - toilet;Walker - standard;Walker - 2 wheels Additional Comments: cousin comes in every day about 4 hours to assist and hopes to get paid as PCA Prior Function Level of Independence: Needs assistance ADL's / Homemaking Assistance Needed: cousin assists with bath and meals and cleaning    Cognition  Cognition Arousal/Alertness: Awake/alert Behavior During Therapy: WFL for tasks assessed/performed Overall Cognitive Status: No family/caregiver present to determine baseline cognitive functioning (slow to process information)    Extremity/Trunk Assessment Lower Extremity Assessment RLE Deficits / Details: edema and numbness in feet; strength hip flexion 3-/5, knee extension 4/5, ankle DF 4+/5 LLE Deficits / Details: edema and numbness in feet; strength hip flexion 3-/5, knee extension 4/5, ankle DF 4+/5   Balance Balance Standing balance-Leahy Scale: Good  End of Session PT - End of Session Equipment Utilized During Treatment: Gait belt Activity Tolerance: Patient tolerated treatment well Patient left: in  bed  GP     [facility] 07/11/2013, 4:56 PM [name], [place] 07/11/2013

## 2013-07-11 NOTE — Discharge Summary (Signed)
Name: Michelle [name] MRN: [id number] DOB: June 15, 1960 53 y.o. PCP: Clinton Gallant, MD  Date of Admission: 07/05/2013  5:35 PM Date of Discharge: 07/11/2013 Attending Physician: Truman Hayward, MD  Discharge Diagnosis: Principal Problem:   Acute on chronic renal failure Active Problems:   Diabetes   Pulmonary hypertension   Diastolic dysfunction-grade 2   HIV (human immunodeficiency virus infection)   Hypertension   CKD (chronic kidney disease)   Shortness of breath   HCV (hepatitis C virus)   Leg edema   Nephrotic syndrome  Discharge Medications:   Medication List         abacavir 300 MG tablet  Commonly known as:  ZIAGEN  Take 2 tablets (600 mg total) by mouth daily.     ACCU-CHEK AVIVA PLUS W/DEVICE Kit  USE AS DIRECTED     acyclovir 800 MG tablet  Commonly known as:  ZOVIRAX  Take 1 tablet (800 mg total) by mouth daily.     aspirin EC 81 MG tablet  Take 1 tablet (81 mg total) by mouth daily.     cholecalciferol 1000 UNITS tablet  Commonly known as:  VITAMIN D  Take 1,000 Units by mouth daily.     cloNIDine 0.2 MG tablet  Commonly known as:  CATAPRES  Take 1 tablet (0.2 mg total) by mouth 2 (two) times daily.     Darunavir Ethanolate 800 MG tablet  Commonly known as:  PREZISTA  Take 1 tablet (800 mg total) by mouth daily with breakfast.     furosemide 80 MG tablet  Commonly known as:  LASIX  Take 2 tablets (160 mg total) by mouth 2 (two) times daily.     gentamicin 0.3 % ophthalmic ointment  Commonly known as:  GARAMYCIN  Place 1 application into both eyes 4 (four) times daily.     glucose blood test strip  Commonly known as:  ACCU-CHEK AVIVA  Use as instructed     hydrALAZINE 50 MG tablet  Commonly known as:  APRESOLINE  Take 50 mg by mouth 3 (three) times daily.     insulin aspart 100 UNIT/ML injection  Commonly known as:  novoLOG  Inject into the skin 3 (three) times daily before meals. Home Sliding scale     insulin glargine 100 UNIT/ML  injection  Commonly known as:  LANTUS  Inject 30 Units into the skin at bedtime.     labetalol 100 MG tablet  Commonly known as:  NORMODYNE  Take 1 tablet (100 mg total) by mouth 2 (two) times daily.     lamiVUDine 150 MG tablet  Commonly known as:  EPIVIR  Take 1 tablet (150 mg total) by mouth daily.     levETIRAcetam 500 MG tablet  Commonly known as:  KEPPRA  Take 3 tablets (1,500 mg total) by mouth every 12 (twelve) hours.     lisinopril 20 MG tablet  Commonly known as:  PRINIVIL,ZESTRIL  Take 1 tablet (20 mg total) by mouth daily.     LORazepam 1 MG tablet  Commonly known as:  ATIVAN  Take 1 mg by mouth daily. Take one tablet for seizure prevention.     methocarbamol 500 MG tablet  Commonly known as:  ROBAXIN  Take 500 mg by mouth every 6 (six) hours as needed for muscle spasms.     NORVIR 100 MG Tabs tablet  Generic drug:  ritonavir  Take 100 mg by mouth daily with breakfast.     Oxycodone HCl 10  MG Tabs  Take 1 tablet (10 mg total) by mouth every 6 (six) hours as needed (severe pain).     pantoprazole 40 MG tablet  Commonly known as:  PROTONIX  Take 1 tablet (40 mg total) by mouth daily.     phenytoin 50 MG tablet  Commonly known as:  DILANTIN  Chew 4 tablets (200 mg total) by mouth 2 (two) times daily.     predniSONE 10 MG tablet  Commonly known as:  DELTASONE  - Take 20 mg daily with breakfast for three days (Friday, 07/12/2013 to 07/14/2013) and then   - Take 10 mg daily with breakfast for three days (Monday 07/15/2013 to 07/17/2013) and stop        Disposition and follow-up:   [name] was discharged from [facility] in Stable condition.  At the hospital follow up visit please address:  1.  Please review her home glucose reading and make adjustments to her as needed in her insulin (A1c not accurate given hemolytic anemia)  2. Resolution of ecchymosis of right axilla, participation with Home health PT.  3. Follow up with  Nephrology scheduled for 2/20 (results of renal biopsy)  4. Weight (dieurised ~13 L/ 23lbs, weight on discharge 182lbs)  5. Mental Status: On hospital day 4 patient was altered, workup including MRI brain neg for acute cause, ?secondary to Complex Migraine Headache vs Hyperglycemia.  6.  Compliance with Prednisone taper.  7. Reevaluate wounds to B/L extremities from scratching (recieved wound care in hospital, has home health on discharge, can be referred to outpatient wound care center at [facility] in not improved)  7.  Labs / imaging needed at time of follow-up: Phenytoin level recheck. Please order albumin level as well and check with pharmacy about dosage changed needed in view of her renal function and subtheraupetic phenytoin level, repeat CBC (Anemia improving?)  8.  Pending labs/ test needing follow-up: Renal Biopsy Pathology Report  Follow-up Appointments: Follow-up Information   Follow up with Rexene Agent, MD. (To Be Arranged)    Specialty:  Nephrology   Contact information:   [place] [address] [phone]       Follow up with Rexene Agent, MD On 07/19/2013. (appt is at 8:15- card given to patient)    Specialty:  Nephrology   Contact information:   [place] [address] [phone]       Follow up with Clinton Gallant, MD On 07/19/2013. (3:15pm)    Specialty:  Internal Medicine   Contact information:   [place]  [phone]       Discharge Instructions:  Future Appointments Provider Department Dept Phone   08/05/2013 10:00 AM [facility] [phone]   08/05/2013 10:30 AM [facility] [phone]      Consultations: Treatment Team:  Rexene Agent, MD  Procedures Performed:  Dg Chest 2 View  07/05/2013   CLINICAL DATA:  53 year old female chest pain and cough  EXAM: CHEST  2 VIEW   COMPARISON:  DG CHEST 2 VIEW dated 07/02/2013; DG CHEST 1V PORT dated 05/10/2013; DG CHEST 2 VIEW dated 06/18/2013  FINDINGS: The cardiomediastinal silhouette is unremarkable.  Mild peribronchial thickening again noted.  There is no evidence of focal airspace disease, pulmonary edema, suspicious pulmonary nodule/mass, pleural effusion, or pneumothorax. No acute bony abnormalities are identified.  IMPRESSION: No active cardiopulmonary disease.  Electronically Signed   By: Hassan Rowan M.D.   On: 07/05/2013 21:38   Dg Chest 2 View  07/02/2013   CLINICAL DATA:  Chest pain, shortness of breath, and cough.  EXAM: CHEST  2 VIEW  COMPARISON:  06/18/2013  FINDINGS: Heart size and pulmonary vascularity are normal. There is peribronchial thickening as well as a nodule in the left lower lobe, unchanged. There is slight scarring or atelectasis in the left lower lobe. This is also unchanged. No consolidative infiltrates or effusions. No acute osseous abnormality.  IMPRESSION: Inflammatory changes as described, unchanged since the prior exam.   Electronically Signed   By: Rozetta Nunnery M.D.   On: 07/02/2013 18:24   Ct Head Wo Contrast  07/09/2013   CLINICAL DATA:  Confusion  EXAM: CT HEAD WITHOUT CONTRAST  TECHNIQUE: Contiguous axial images were obtained from the base of the skull through the vertex without intravenous contrast.  COMPARISON:  May 28, 2013  FINDINGS: Moderate generalized atrophy is stable. There is no mass, hemorrhage, extra-axial fluid collection, or midline shift. There is patchy small vessel disease in the centra semiovale bilaterally. Gray-white compartments are otherwise normal. No demonstrable acute infarct. Bony calvarium appears intact. The mastoid air cells are clear. There is mild debris in the left external auditory canal.  IMPRESSION: Atrophy with patchy periventricular small vessel disease. Probable cerumen in the left external auditory canal. Study otherwise unremarkable.   Electronically Signed    By: Lowella Grip M.D.   On: 07/09/2013 11:13   Mr Brain Wo Contrast  07/09/2013   CLINICAL DATA:  Acute encephalopathy.  Rule out stroke.  EXAM: MRI HEAD WITHOUT CONTRAST  TECHNIQUE: Multiplanar, multiecho pulse sequences of the brain and surrounding structures were obtained without intravenous contrast.  COMPARISON:  Head CT 07/09/2013 and brain MRI 05/10/2013  FINDINGS: Mildly heterogeneous marrow signal in the clivus is unchanged. There is no evidence of acute infarct. Patchy T2 hyperintensities within the subcortical and deep cerebral white matter do not appear significantly changed and are nonspecific but compatible with mild chronic small vessel ischemic disease. There is no evidence of intracranial hemorrhage. There is moderate cerebral atrophy, mildly advanced for age and unchanged. There is no evidence of mass, midline shift, or extra-axial fluid collection. Major intracranial vascular flow voids are unremarkable. Orbits are normal. Paranasal sinuses and mastoid air cells are clear.  IMPRESSION: Unchanged appearance of the brain with mild chronic small vessel ischemic disease and mildly advanced cerebral atrophy for age. No evidence of acute intracranial abnormality.   Electronically Signed   By: Logan Bores   On: 07/09/2013 19:02   US Biopsy  07/08/2013   INDICATION: History of HIV, hepatitis-C, diabetes, now with acute on chronic renal insufficiency and nephrotic syndrome  EXAM: ULTRASOUND GUIDED RENAL BIOPSY  MEDICATIONS: Fentanyl 50 mcg IV; Versed 1 mg IV  ANESTHESIA/SEDATION: Total Moderate Sedation time  15 minutes  COMPARISON:  CT CHEST W/O CM dated 06/19/2013; US ABDOMEN COMPLETE dated 06/19/2013  PROCEDURE: Informed written consent was obtained from the patient after a discussion of the risks, benefits and alternatives to treatment. The patient understands and consents the procedure. A timeout was performed prior to the initiation of the procedure.  Ultrasound scanning was performed of the  bilateral flanks. The inferior pole of the left kidney was selected for biopsy due to location and sonographic window. The procedure was planned. The operative site was prepped and draped in the usual sterile fashion. The overlying soft tissues were anesthetized with 1%  lidocaine with epinephrine. A 17 gauge core needle biopsy device was advanced into the inferior cortex of the left kidney and 3 core biopsies were obtained under direct ultrasound guidance. Real time pathologic review confirmed adequate tissue acquisition. Images were saved for documentation purposes. The biopsy device was removed and hemostasis was obtained with manual compression. Post procedural scanning was negative for significant post procedural hemorrhage or additional complication. A dressing was placed. The patient tolerated the procedure well without immediate post procedural complication.  COMPLICATIONS: None immediate  IMPRESSION: Technically successful ultrasound guided left renal biopsy.   Electronically Signed   By: Sandi Mariscal M.D.   On: 07/08/2013 16:36    Admission HPI: 53 year old woman, with past medical history of dCHF (EF normal, with Grade-2 diastolic dysfunction), HIV (CD4 490 and VL<20 on 05/07/13), chronic HCV, seizure disorder, hx of stroke, HTN, T2DM, CKD, gout, and AIHA, who is admitted through the [facility] for inpatient diuretic therapy. Her outpatient diuretic therapy has been complicated with a raising creatinine and increasing bilateral lower extremity edema with fluid drainage from her legs.  Patient reports that her legs have been swollen over the last 1 month. However, the swelling has increased in the past 2-3 weeks with increasing clear, yellow fluid drainage coming from bilateral shins. She also noticed increased lower extremity itching and this has resulted into superficial ulcerations of her legs after repeated scratching. She also reports that her right lower extremity is significantly larger than the left. She  denies fevers, chills, or nausea or vomiting. Her appetite has been normal. Her home regimen includes furosemide 80 mg twice a day with excellent compliance. However, her creatinine is raised from 1.73 on 06/19/2013 to 2.1 on 07/02/2013 which prompted admission for fluid overload management. She has gained 6lb over the last 3 weeks ( 198 lb at her discharge on 06/20/13 to 203 today).  She denies PND, orthopnea, or current chest pain. However, she reports that over the last several days she has been having cough with yellow phlegm production, associated with wheezing, sore throat, but without fevers. She denies hemoptysis.   Hospital Course by problem list:    Acute on chronic renal failure due to Nephrotic Syndrome complicated by diastolic congestive Heart failure Patient presented to PCP office with chief complaint of increased lower extremity swelling with weaping and B/L purulent wounds on lower extremities from scratching as well as increased shortness of breath.  She was found to be 6 lbs over her previous discharge weight despite her increase in diuretic regimen on previous discharge.  Labs checked at PCP showed decreased renal function from baseline and there was a concern for possible cardiorenal syndrome.  She was directly admitted to [facility] for aggressive diuresis and close monitoring of her renal function.  Nephrology was consulted given her complex renal history and multiple possible etiology's for nephrotic syndrome.  Initially she was diuresed with 6m of IV Lasix TID, however when this was insufficient it was increased to 1659mIV TID.  Ultimately she was diuresed roughly 13 liters with a 23 lb drop in weight with marked improvement in her lower extremity edema.  She was changed to 16035mO Lasix and monitored overnight with continued good output and was continued at this dosage on discharge.  During her hospitalization a renal biopsy was also preformed and sent for pathology to clarify her cause  of nephrotic syndrome.  She has follow up scheduled with Dr. SanJoelyn OmsAutoimmune hemolytic anemia On last admission patient was diagnosed with  AIHA and evaluated by Dr. Juliann Mule.  It was thought that ceftriaxone was the likely source and was discontinued.  At that discharge she was given a taper of prednisone.  On admission her hemoglobin was 6.9 which was decreased from 8.6 three day prior.  There was concern for recurrent AIHA and hydralazine was stopped and her prednisone was increased back to 50m daily, however her repeat labs showed a further decrease in hemoglobin to 6.3 and she was transfused 2 units of PRBC.  However hemolytic anemia workup showed a haptoglobin of 208, with a direct bili of 2.0, LDH 326, and G6PD of 14.3.  She did not appear to have active hemolysis,  She was however continued on prednisone during her stay, her hemoglobin improved to 10.8 on discharge.  After her renal biopsy SCDs were utilized for DVT prophylaxis to decrease risk of bleeding. Of note on hospital day 6 (prior to discharge) she was noted to have a ~10 cm area of ecchymosis at her right axilla. CBC, PT, INR, aPTT were all obtained and were wnl.  The area was non tender and thought to have been due to some trauma to the area (unlikely infectious), given her acute encephalopathy during the admission PT was consulted to evaluate for gait unsteadiness and recommended home health Physical Therapy.    Acute encephalopathy On hospital day 4 patient appeared acutely confused and had trouble answering simple orientation questions.  A CT and MRI of the brain was obtained that revealed no acute changes.  An ABG was obtained that was WNL.  Her glucose at that time was found to be elevated and she did complain of a headache.  With the improvement of her blood glucose and headache with pain medication her encephalopathy resolved.  HTN Managed with Lisinopril, Labetalol, and clonidine.  Clonidine was decreased as tolerated.  HIV  (human immunodeficiency virus infection) Her home medications were continued and adjusted for renal function accordingly.  Diabetes Patient was managed initially with her home dose of Lantus with SSI coverage. She had frequent hypergylcemia which was likely exacerbated by increasing her prednisone dose to 486m  Her Lantus dose was gradually titrated up to 60 units QHS with an additional 8 units of Novolog at means and a resistant sliding scale insulin. On discharge she was instructed to resume her home insulin regimen of Lantus 30units QHS with 3 units TID with meals as well as a sliding scale.  Her prednisone dose was also reduced to 2022mnd she was instructed to resume her prednisone taper schedule.  Note A1c of 5 in December likely artificially low due to RBC destruction from Hemolytic anemia. She was instructed to bring glucometer/ glucose log to hospital follow up appointment and call if she has high readings.   HCV (hepatitis C virus) Possible Cirrhosis   Not an acute issue during this hospitalization but may be candidate for Tx at [facility]  Discharge Vitals:   BP 169/80  Pulse 69  Temp(Src) 97.9 F (36.6 C) (Oral)  Resp 18  Ht 5' 5.5" (1.664 m)  Wt 182 lb 1.6 oz (82.6 kg)  BMI 29.83 kg/m2  SpO2 100%  Discharge Labs:  Results for orders placed during the hospital encounter of 07/05/13 (from the past 24 hour(s))  GLUCOSE, CAPILLARY     Status: Abnormal   Collection Time    07/10/13  5:03 PM      Result Value Ref Range   Glucose-Capillary 388 (*) 70 - 99 mg/dL   Comment 1  Notify RN    GLUCOSE, CAPILLARY     Status: Abnormal   Collection Time    07/10/13 11:46 PM      Result Value Ref Range   Glucose-Capillary 296 (*) 70 - 99 mg/dL  GLUCOSE, CAPILLARY     Status: Abnormal   Collection Time    07/11/13  6:21 AM      Result Value Ref Range   Glucose-Capillary 183 (*) 70 - 99 mg/dL   Comment 1 Notify RN     Comment 2 Documented in Chart    RENAL FUNCTION PANEL     Status:  Abnormal   Collection Time    07/11/13  7:01 AM      Result Value Ref Range   Sodium 135 (*) 137 - 147 mEq/L   Potassium 4.0  3.7 - 5.3 mEq/L   Chloride 96  96 - 112 mEq/L   CO2 22  19 - 32 mEq/L   Glucose, Bld 213 (*) 70 - 99 mg/dL   BUN 74 (*) 6 - 23 mg/dL   Creatinine, Ser 2.38 (*) 0.50 - 1.10 mg/dL   Calcium 8.1 (*) 8.4 - 10.5 mg/dL   Phosphorus 4.0  2.3 - 4.6 mg/dL   Albumin 2.0 (*) 3.5 - 5.2 g/dL   GFR calc non Af Amer 22 (*) >90 mL/min   GFR calc Af Amer 25 (*) >90 mL/min  APTT     Status: None   Collection Time    07/11/13 10:18 AM      Result Value Ref Range   aPTT 25  24 - 37 seconds  PROTIME-INR     Status: None   Collection Time    07/11/13 10:18 AM      Result Value Ref Range   Prothrombin Time 12.1  11.6 - 15.2 seconds   INR 0.91  0.00 - 1.49  CBC     Status: Abnormal   Collection Time    07/11/13 10:18 AM      Result Value Ref Range   WBC 8.3  4.0 - 10.5 K/uL   RBC 3.25 (*) 3.87 - 5.11 MIL/uL   Hemoglobin 10.8 (*) 12.0 - 15.0 g/dL   HCT 31.7 (*) 36.0 - 46.0 %   MCV 97.5  78.0 - 100.0 fL   MCH 33.2  26.0 - 34.0 pg   MCHC 34.1  30.0 - 36.0 g/dL   RDW 16.9 (*) 11.5 - 15.5 %   Platelets 289  150 - 400 K/uL  GLUCOSE, CAPILLARY     Status: Abnormal   Collection Time    07/11/13 11:18 AM      Result Value Ref Range   Glucose-Capillary 277 (*) 70 - 99 mg/dL   Comment 1 Notify RN      Signed: Joni Reining, DO 07/11/2013, 12:20 PM   Time Spent on Discharge: 55 minutes Services Ordered on Discharge: [place] PT, [place] RN Equipment Ordered on Discharge: 3 in 1 walker, bedside commode

## 2013-07-11 NOTE — Discharge Instructions (Signed)
·   Thank you for allowing [place] to be involved in your healthcare while you were hospitalized at [facility].   Please note that there have been changes to your home medications.  --> PLEASE LOOK AT YOUR DISCHARGE MEDICATION LIST FOR DETAILS.  Please take great care to notice changes in your medications. Other changes can be discussed with your doctor during your follow up visit.   Please call your PCP if you have any questions or concerns, or any difficulty getting any of your medications.  Please return to the ER if you have worsening of your symptoms or new severe symptoms arise.

## 2013-07-11 NOTE — Progress Notes (Signed)
Pt. Alert and oriented this am. Pt. Continues to speak off subject randomly, however is able to answer questions correctly. Pt. Continues to c/o soreness to her lower legs that is constant. Pt. Stated she did not need anything for pain at this time. Bruised area noted to pts. Upper right shoulder/arm. Pt. Stated she does not know how the bruised happened. On call, MD for Internal Medicine made aware and at pts. Bedside to assess the area. No new orders received. RN will continue to monitor pt. For  Changes in condition. Francia Verry, Cheryll Dessert

## 2013-07-11 NOTE — Progress Notes (Signed)
Admit: 07/05/2013 LOS: 6  70F w nephrotic syndrome, progressive CKD, in setting of HCV + VL, +ANA, well controlled HIV, DM2 and hx/o AIHA w/ anemia currenlty. Has hx/o +RPR and TPA. Very broad differential for nephrotic syndrome currently. S/p renal biopsy on 2/9   Subjective:   Diuresing extremely well-still.   BP OK-  creatinine stable - glucose better also  02/11 0701 - 02/12 0700 In: 1440 [P.O.:1440] Out: 2650 [Urine:2650]  Filed Weights   07/09/13 0605 07/10/13 0553 07/11/13 0507  Weight: 84.1 kg (185 lb 6.5 oz) 83.507 kg (184 lb 1.6 oz) 82.6 kg (182 lb 1.6 oz)    Current meds: reviewed  Current Labs: reviewed    Physical Exam:  Blood pressure 166/71, pulse 58, temperature 97.9 F (36.6 C), temperature source Oral, resp. rate 18, height 5' 5.5" (1.664 m), weight 82.6 kg (182 lb 1.6 oz), SpO2 100.00%. GEN: NAD, standing in room,  ENT: poor dentition, b/l periorbital fullness  EYES: EOMI  CV: RRR, nl s1s2. 2/6 MSM at apex  PULM: CTAB. Nl wob  ABD: obese, s/nt/nd  SKIN: some oozing across shins  EXT:2+ LEE into thighs and dependently; ACE wraps on   Assessment 70F w nephrotic syndrome, progressive CKD, in setting of HCV + VL, +ANA, well controlled HIV, DM2 and hx/o AIHA w/ anemia currenlty. Has hx/o +RPR and TPA. Very broad differential for nephrotic syndrome currently. S/p renal biopsy on 2/9 1. Nephrotic Syndrome, anasarca, hypoalbuminemia. - renal function still pretty stable.  Diuresing well on PO lasix.  has diuresed quite well, weight down 11 kg if you take the top weight and weight from today,  also on lisinopril.   Differential diagnosis for nephrotic syndrome  is broad, so s/p renal biopsy on 2/9- likely no results until this PM  Serologies appearing negative except positive ANA  On prednisone for autoimmune anemia. Diuresing well on oral lasix- I am Ok with discharge and she will follow up with Dr. Marisue Humble on Feb 20 at 8:15 AM  2. HTN/volume- is improving as volume  status improves- on lisinopril as well for proteinuria- clonidine and labetalol as well- clonidine would probably be the first med I would wean as it may not be practical OP med for her.  I did decrease her clonidine- would keep meds as is as they can be fine tuned as OP 3. 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 HIV- on meds, controlled 5. DM- sugar better 6. Dispo- we probably wont have bx results until Thursday PM-because creatinine is stable she will likely not need emergent tx that would need to be administered in an inpt setting.  She is stable for discharge in my opinion   Wandalee Klang A  07/11/2013, 9:50 AM   Recent Labs Lab 07/09/13 1630 07/10/13 0424 07/11/13 0701  NA 132* 137 135*  K 4.7 4.1 4.0  CL 95* 99 96  CO2 20 21 22   GLUCOSE 517* 174* 213*  BUN 68* 67* 74*  CREATININE 2.46* 2.38* 2.38*  CALCIUM 8.1* 8.0* 8.1*  PHOS 3.7 3.7 4.0    Recent Labs Lab 07/05/13 2110  07/07/13 0515 07/08/13 0536 07/09/13 0340  WBC 7.7  < > 6.3 7.5 5.8  NEUTROABS 5.9  --   --   --   --   HGB 6.9*  < > 8.4* 9.2* 8.9*  HCT 19.8*  < > 25.0* 26.0* 26.1*  MCV 101.5*  < > 98.4 94.9 96.0  PLT 181  < > 199 179 222  < > = values in this interval not displayed.

## 2013-07-11 NOTE — Progress Notes (Signed)
DC IV, DC Tele, DC Home. Discharge instructions and home medications discussed with patient and patient's niece. Patient and niece denies any questions or concerns at this time. Patient leaving unit via wheelchair and appears in no acute distress.

## 2013-07-12 ENCOUNTER — Other Ambulatory Visit: Payer: Self-pay

## 2013-07-12 ENCOUNTER — Telehealth: Payer: Self-pay | Admitting: Internal Medicine

## 2013-07-12 NOTE — Telephone Encounter (Signed)
s/w pt re appt for 2/16

## 2013-07-15 ENCOUNTER — Other Ambulatory Visit: Payer: Medicaid Other

## 2013-07-15 NOTE — Discharge Summary (Signed)
  Date: 07/15/2013  Patient name: Michelle [name]  Medical record number: [id number]  Date of birth: March 09, 1961   This patient has been seen and the plan of care was discussed with the house staff. Please see their note for complete details. I concur with their findings with the following additions/corrections:  HIV drugs were renally adjusted. Greatly appreciated Nephrology's critical assistance with this patient.  Randall Hiss, MD 07/15/2013, 6:29 PM

## 2013-07-18 ENCOUNTER — Inpatient Hospital Stay (HOSPITAL_COMMUNITY)
Admission: EM | Admit: 2013-07-18 | Discharge: 2013-07-27 | DRG: 853 | Disposition: A | Discharge status: Home Health | Payer: Medicaid Other | Attending: Internal Medicine | Admitting: Internal Medicine

## 2013-07-18 ENCOUNTER — Emergency Department (HOSPITAL_COMMUNITY): Payer: Medicaid Other

## 2013-07-18 ENCOUNTER — Encounter (HOSPITAL_COMMUNITY): Payer: Self-pay | Admitting: Emergency Medicine

## 2013-07-18 ENCOUNTER — Other Ambulatory Visit (HOSPITAL_COMMUNITY): Payer: Self-pay | Admitting: Internal Medicine

## 2013-07-18 DIAGNOSIS — K7460 Unspecified cirrhosis of liver: Secondary | ICD-10-CM

## 2013-07-18 DIAGNOSIS — N179 Acute kidney failure, unspecified: Secondary | ICD-10-CM | POA: Diagnosis present

## 2013-07-18 DIAGNOSIS — A4151 Sepsis due to Escherichia coli [E. coli]: Principal | ICD-10-CM | POA: Diagnosis present

## 2013-07-18 DIAGNOSIS — Z881 Allergy status to other antibiotic agents status: Secondary | ICD-10-CM

## 2013-07-18 DIAGNOSIS — Z8673 Personal history of transient ischemic attack (TIA), and cerebral infarction without residual deficits: Secondary | ICD-10-CM

## 2013-07-18 DIAGNOSIS — N19 Unspecified kidney failure: Secondary | ICD-10-CM | POA: Diagnosis present

## 2013-07-18 DIAGNOSIS — E1310 Other specified diabetes mellitus with ketoacidosis without coma: Secondary | ICD-10-CM | POA: Diagnosis present

## 2013-07-18 DIAGNOSIS — E1165 Type 2 diabetes mellitus with hyperglycemia: Secondary | ICD-10-CM | POA: Diagnosis present

## 2013-07-18 DIAGNOSIS — I639 Cerebral infarction, unspecified: Secondary | ICD-10-CM

## 2013-07-18 DIAGNOSIS — G40909 Epilepsy, unspecified, not intractable, without status epilepticus: Secondary | ICD-10-CM | POA: Diagnosis present

## 2013-07-18 DIAGNOSIS — Z885 Allergy status to narcotic agent status: Secondary | ICD-10-CM

## 2013-07-18 DIAGNOSIS — I5189 Other ill-defined heart diseases: Secondary | ICD-10-CM

## 2013-07-18 DIAGNOSIS — E1129 Type 2 diabetes mellitus with other diabetic kidney complication: Secondary | ICD-10-CM | POA: Diagnosis present

## 2013-07-18 DIAGNOSIS — Z7982 Long term (current) use of aspirin: Secondary | ICD-10-CM

## 2013-07-18 DIAGNOSIS — R197 Diarrhea, unspecified: Secondary | ICD-10-CM | POA: Diagnosis not present

## 2013-07-18 DIAGNOSIS — G9340 Encephalopathy, unspecified: Secondary | ICD-10-CM | POA: Diagnosis present

## 2013-07-18 DIAGNOSIS — E1169 Type 2 diabetes mellitus with other specified complication: Secondary | ICD-10-CM

## 2013-07-18 DIAGNOSIS — J189 Pneumonia, unspecified organism: Secondary | ICD-10-CM | POA: Diagnosis present

## 2013-07-18 DIAGNOSIS — R569 Unspecified convulsions: Secondary | ICD-10-CM

## 2013-07-18 DIAGNOSIS — N12 Tubulo-interstitial nephritis, not specified as acute or chronic: Secondary | ICD-10-CM | POA: Diagnosis not present

## 2013-07-18 DIAGNOSIS — N032 Chronic nephritic syndrome with diffuse membranous glomerulonephritis: Secondary | ICD-10-CM | POA: Diagnosis present

## 2013-07-18 DIAGNOSIS — E119 Type 2 diabetes mellitus without complications: Secondary | ICD-10-CM

## 2013-07-18 DIAGNOSIS — A419 Sepsis, unspecified organism: Secondary | ICD-10-CM | POA: Diagnosis present

## 2013-07-18 DIAGNOSIS — E1110 Type 2 diabetes mellitus with ketoacidosis without coma: Secondary | ICD-10-CM | POA: Diagnosis present

## 2013-07-18 DIAGNOSIS — I10 Essential (primary) hypertension: Secondary | ICD-10-CM | POA: Diagnosis present

## 2013-07-18 DIAGNOSIS — N183 Chronic kidney disease, stage 3 unspecified: Secondary | ICD-10-CM

## 2013-07-18 DIAGNOSIS — N058 Unspecified nephritic syndrome with other morphologic changes: Secondary | ICD-10-CM | POA: Diagnosis present

## 2013-07-18 DIAGNOSIS — N170 Acute kidney failure with tubular necrosis: Secondary | ICD-10-CM | POA: Diagnosis not present

## 2013-07-18 DIAGNOSIS — B1920 Unspecified viral hepatitis C without hepatic coma: Secondary | ICD-10-CM

## 2013-07-18 DIAGNOSIS — B182 Chronic viral hepatitis C: Secondary | ICD-10-CM | POA: Diagnosis present

## 2013-07-18 DIAGNOSIS — B20 Human immunodeficiency virus [HIV] disease: Secondary | ICD-10-CM

## 2013-07-18 DIAGNOSIS — Z79899 Other long term (current) drug therapy: Secondary | ICD-10-CM

## 2013-07-18 DIAGNOSIS — I5031 Acute diastolic (congestive) heart failure: Secondary | ICD-10-CM

## 2013-07-18 DIAGNOSIS — N189 Chronic kidney disease, unspecified: Secondary | ICD-10-CM

## 2013-07-18 DIAGNOSIS — IMO0002 Reserved for concepts with insufficient information to code with codable children: Secondary | ICD-10-CM | POA: Diagnosis present

## 2013-07-18 DIAGNOSIS — Y95 Nosocomial condition: Secondary | ICD-10-CM

## 2013-07-18 DIAGNOSIS — I5030 Unspecified diastolic (congestive) heart failure: Secondary | ICD-10-CM | POA: Diagnosis present

## 2013-07-18 DIAGNOSIS — I120 Hypertensive chronic kidney disease with stage 5 chronic kidney disease or end stage renal disease: Secondary | ICD-10-CM | POA: Diagnosis present

## 2013-07-18 DIAGNOSIS — N049 Nephrotic syndrome with unspecified morphologic changes: Secondary | ICD-10-CM | POA: Diagnosis present

## 2013-07-18 DIAGNOSIS — I509 Heart failure, unspecified: Secondary | ICD-10-CM | POA: Diagnosis present

## 2013-07-18 DIAGNOSIS — D638 Anemia in other chronic diseases classified elsewhere: Secondary | ICD-10-CM | POA: Diagnosis present

## 2013-07-18 DIAGNOSIS — Z87891 Personal history of nicotine dependence: Secondary | ICD-10-CM

## 2013-07-18 DIAGNOSIS — R6510 Systemic inflammatory response syndrome (SIRS) of non-infectious origin without acute organ dysfunction: Secondary | ICD-10-CM | POA: Diagnosis present

## 2013-07-18 DIAGNOSIS — Z794 Long term (current) use of insulin: Secondary | ICD-10-CM

## 2013-07-18 DIAGNOSIS — B9620 Unspecified Escherichia coli [E. coli] as the cause of diseases classified elsewhere: Secondary | ICD-10-CM | POA: Diagnosis present

## 2013-07-18 DIAGNOSIS — Z8249 Family history of ischemic heart disease and other diseases of the circulatory system: Secondary | ICD-10-CM

## 2013-07-18 DIAGNOSIS — Z21 Asymptomatic human immunodeficiency virus [HIV] infection status: Secondary | ICD-10-CM | POA: Diagnosis present

## 2013-07-18 DIAGNOSIS — N186 End stage renal disease: Secondary | ICD-10-CM | POA: Diagnosis present

## 2013-07-18 DIAGNOSIS — D591 Autoimmune hemolytic anemia, unspecified: Secondary | ICD-10-CM | POA: Diagnosis present

## 2013-07-18 DIAGNOSIS — R5381 Other malaise: Secondary | ICD-10-CM | POA: Diagnosis not present

## 2013-07-18 DIAGNOSIS — R7881 Bacteremia: Secondary | ICD-10-CM

## 2013-07-18 LAB — I-STAT CHEM 8, ED
BUN: 124 mg/dL — ABNORMAL HIGH (ref 6–23)
CHLORIDE: 98 meq/L (ref 96–112)
CREATININE: 4.6 mg/dL — AB (ref 0.50–1.10)
Calcium, Ion: 0.96 mmol/L — ABNORMAL LOW (ref 1.12–1.23)
GLUCOSE: 524 mg/dL — AB (ref 70–99)
HEMATOCRIT: 31 % — AB (ref 36.0–46.0)
Hemoglobin: 10.5 g/dL — ABNORMAL LOW (ref 12.0–15.0)
Potassium: 4.3 mEq/L (ref 3.7–5.3)
Sodium: 130 mEq/L — ABNORMAL LOW (ref 137–147)
TCO2: 18 mmol/L (ref 0–100)

## 2013-07-18 LAB — I-STAT CG4 LACTIC ACID, ED: Lactic Acid, Venous: 1.53 mmol/L (ref 0.5–2.2)

## 2013-07-18 MED ORDER — SODIUM CHLORIDE 0.9 % IV BOLUS (SEPSIS)
1000.0000 mL | Freq: Once | INTRAVENOUS | Status: AC
  Administered 2013-07-19: 1000 mL via INTRAVENOUS

## 2013-07-18 MED ORDER — ACETAMINOPHEN 500 MG PO TABS
1000.0000 mg | ORAL_TABLET | Freq: Once | ORAL | Status: AC
  Administered 2013-07-18: 1000 mg via ORAL
  Filled 2013-07-18: qty 2

## 2013-07-18 MED ORDER — SODIUM CHLORIDE 0.9 % IV SOLN
INTRAVENOUS | Status: DC
  Administered 2013-07-19: 3.6 [IU]/h via INTRAVENOUS
  Administered 2013-07-19: 4.4 [IU]/h via INTRAVENOUS
  Filled 2013-07-18 (×2): qty 1

## 2013-07-18 NOTE — ED Notes (Signed)
Pt in via EMS, per EMS, called out by patient family due to patient not acting per her normal, patient is a diabetic, CBG reading HI, unsure when patient last took insulin, pt also febrile, pt lethargic and confused, maintaining airway without difficulty

## 2013-07-19 ENCOUNTER — Encounter (HOSPITAL_COMMUNITY): Payer: Self-pay | Admitting: *Deleted

## 2013-07-19 ENCOUNTER — Inpatient Hospital Stay (HOSPITAL_COMMUNITY): Payer: Medicaid Other

## 2013-07-19 ENCOUNTER — Encounter: Payer: Medicaid Other | Admitting: Internal Medicine

## 2013-07-19 DIAGNOSIS — D72829 Elevated white blood cell count, unspecified: Secondary | ICD-10-CM

## 2013-07-19 DIAGNOSIS — I959 Hypotension, unspecified: Secondary | ICD-10-CM

## 2013-07-19 DIAGNOSIS — D591 Autoimmune hemolytic anemia, unspecified: Secondary | ICD-10-CM

## 2013-07-19 DIAGNOSIS — N19 Unspecified kidney failure: Secondary | ICD-10-CM | POA: Diagnosis present

## 2013-07-19 DIAGNOSIS — E1110 Type 2 diabetes mellitus with ketoacidosis without coma: Secondary | ICD-10-CM

## 2013-07-19 DIAGNOSIS — N179 Acute kidney failure, unspecified: Secondary | ICD-10-CM

## 2013-07-19 DIAGNOSIS — G9340 Encephalopathy, unspecified: Secondary | ICD-10-CM

## 2013-07-19 DIAGNOSIS — G40909 Epilepsy, unspecified, not intractable, without status epilepticus: Secondary | ICD-10-CM

## 2013-07-19 DIAGNOSIS — N189 Chronic kidney disease, unspecified: Secondary | ICD-10-CM

## 2013-07-19 DIAGNOSIS — E1310 Other specified diabetes mellitus with ketoacidosis without coma: Secondary | ICD-10-CM

## 2013-07-19 DIAGNOSIS — I5031 Acute diastolic (congestive) heart failure: Secondary | ICD-10-CM

## 2013-07-19 LAB — URINE MICROSCOPIC-ADD ON

## 2013-07-19 LAB — COMPREHENSIVE METABOLIC PANEL
ALT: 38 U/L — AB (ref 0–35)
AST: 30 U/L (ref 0–37)
Albumin: 1.8 g/dL — ABNORMAL LOW (ref 3.5–5.2)
Alkaline Phosphatase: 297 U/L — ABNORMAL HIGH (ref 39–117)
BUN: 123 mg/dL — ABNORMAL HIGH (ref 6–23)
CALCIUM: 8 mg/dL — AB (ref 8.4–10.5)
CHLORIDE: 93 meq/L — AB (ref 96–112)
CO2: 17 mEq/L — ABNORMAL LOW (ref 19–32)
Creatinine, Ser: 4.23 mg/dL — ABNORMAL HIGH (ref 0.50–1.10)
GFR calc Af Amer: 13 mL/min — ABNORMAL LOW (ref >90)
GFR, EST NON AFRICAN AMERICAN: 11 mL/min — AB (ref >90)
Glucose, Bld: 500 mg/dL — ABNORMAL HIGH (ref 70–99)
Potassium: 4.5 mEq/L (ref 3.7–5.3)
SODIUM: 132 meq/L — AB (ref 137–147)
Total Bilirubin: 10.8 mg/dL — ABNORMAL HIGH (ref 0.3–1.2)
Total Protein: 6.2 g/dL (ref 6.0–8.3)

## 2013-07-19 LAB — URINALYSIS, ROUTINE W REFLEX MICROSCOPIC
Glucose, UA: 250 mg/dL — AB
KETONES UR: NEGATIVE mg/dL
Nitrite: NEGATIVE
Protein, ur: >300 mg/dL — AB
Specific Gravity, Urine: 1.021 (ref 1.005–1.030)
Urobilinogen, UA: 2 mg/dL — ABNORMAL HIGH (ref 0.0–1.0)
pH: 5 (ref 5.0–8.0)

## 2013-07-19 LAB — BASIC METABOLIC PANEL
BUN: 116 mg/dL — ABNORMAL HIGH (ref 6–23)
BUN: 119 mg/dL — ABNORMAL HIGH (ref 6–23)
BUN: 116 mg/dL — ABNORMAL HIGH (ref 6–23)
CALCIUM: 8 mg/dL — AB (ref 8.4–10.5)
CHLORIDE: 98 meq/L (ref 96–112)
CO2: 14 mEq/L — ABNORMAL LOW (ref 19–32)
CO2: 18 mEq/L — ABNORMAL LOW (ref 19–32)
CO2: 20 mEq/L (ref 19–32)
Calcium: 8.3 mg/dL — ABNORMAL LOW (ref 8.4–10.5)
Calcium: 7.9 mg/dL — ABNORMAL LOW (ref 8.4–10.5)
Chloride: 94 mEq/L — ABNORMAL LOW (ref 96–112)
Chloride: 99 mEq/L (ref 96–112)
Creatinine, Ser: 4.12 mg/dL — ABNORMAL HIGH (ref 0.50–1.10)
Creatinine, Ser: 4.18 mg/dL — ABNORMAL HIGH (ref 0.50–1.10)
Creatinine, Ser: 3.88 mg/dL — ABNORMAL HIGH (ref 0.50–1.10)
GFR calc Af Amer: 13 mL/min — ABNORMAL LOW (ref >90)
GFR calc Af Amer: 13 mL/min — ABNORMAL LOW (ref >90)
GFR calc Af Amer: 14 mL/min — ABNORMAL LOW (ref >90)
GFR calc non Af Amer: 11 mL/min — ABNORMAL LOW (ref >90)
GFR calc non Af Amer: 12 mL/min — ABNORMAL LOW (ref >90)
GFR, EST NON AFRICAN AMERICAN: 11 mL/min — AB (ref >90)
GLUCOSE: 133 mg/dL — AB (ref 70–99)
Glucose, Bld: 181 mg/dL — ABNORMAL HIGH (ref 70–99)
Glucose, Bld: 193 mg/dL — ABNORMAL HIGH (ref 70–99)
POTASSIUM: 4.7 meq/L (ref 3.7–5.3)
POTASSIUM: 3.6 meq/L — AB (ref 3.7–5.3)
POTASSIUM: 3.6 meq/L — AB (ref 3.7–5.3)
SODIUM: 134 meq/L — AB (ref 137–147)
SODIUM: 134 meq/L — AB (ref 137–147)
Sodium: 136 mEq/L — ABNORMAL LOW (ref 137–147)

## 2013-07-19 LAB — RENAL FUNCTION PANEL
Albumin: 1.6 g/dL — ABNORMAL LOW (ref 3.5–5.2)
BUN: 108 mg/dL — ABNORMAL HIGH (ref 6–23)
CALCIUM: 8 mg/dL — AB (ref 8.4–10.5)
CHLORIDE: 98 meq/L (ref 96–112)
CO2: 22 mEq/L (ref 19–32)
Creatinine, Ser: 3.63 mg/dL — ABNORMAL HIGH (ref 0.50–1.10)
GFR calc Af Amer: 15 mL/min — ABNORMAL LOW (ref >90)
GFR calc non Af Amer: 13 mL/min — ABNORMAL LOW (ref >90)
GLUCOSE: 118 mg/dL — AB (ref 70–99)
Phosphorus: 4.9 mg/dL — ABNORMAL HIGH (ref 2.3–4.6)
Potassium: 3.8 mEq/L (ref 3.7–5.3)
Sodium: 138 mEq/L (ref 137–147)

## 2013-07-19 LAB — CBC
HCT: 28.2 % — ABNORMAL LOW (ref 36.0–46.0)
Hemoglobin: 9.7 g/dL — ABNORMAL LOW (ref 12.0–15.0)
MCH: 33.8 pg (ref 26.0–34.0)
MCHC: 34.4 g/dL (ref 30.0–36.0)
MCV: 98.3 fL (ref 78.0–100.0)
PLATELETS: 157 10*3/uL (ref 150–400)
RBC: 2.87 MIL/uL — AB (ref 3.87–5.11)
RDW: 18.2 % — ABNORMAL HIGH (ref 11.5–15.5)
WBC: 17.6 10*3/uL — AB (ref 4.0–10.5)

## 2013-07-19 LAB — CBC WITH DIFFERENTIAL/PLATELET
Basophils Absolute: 0 10*3/uL (ref 0.0–0.1)
Basophils Relative: 0 % (ref 0–1)
EOS ABS: 0 10*3/uL (ref 0.0–0.7)
EOS PCT: 0 % (ref 0–5)
HCT: 26.8 % — ABNORMAL LOW (ref 36.0–46.0)
Hemoglobin: 9.3 g/dL — ABNORMAL LOW (ref 12.0–15.0)
LYMPHS PCT: 6 % — AB (ref 12–46)
Lymphs Abs: 1.2 10*3/uL (ref 0.7–4.0)
MCH: 34.1 pg — ABNORMAL HIGH (ref 26.0–34.0)
MCHC: 34.7 g/dL (ref 30.0–36.0)
MCV: 98.2 fL (ref 78.0–100.0)
Monocytes Absolute: 1.7 10*3/uL — ABNORMAL HIGH (ref 0.1–1.0)
Monocytes Relative: 8 % (ref 3–12)
NEUTROS PCT: 86 % — AB (ref 43–77)
Neutro Abs: 17.8 10*3/uL — ABNORMAL HIGH (ref 1.7–7.7)
Platelets: 159 10*3/uL (ref 150–400)
RBC: 2.73 MIL/uL — AB (ref 3.87–5.11)
RDW: 18.1 % — ABNORMAL HIGH (ref 11.5–15.5)
SMEAR REVIEW: ADEQUATE
WBC: 20.7 10*3/uL — ABNORMAL HIGH (ref 4.0–10.5)

## 2013-07-19 LAB — GLUCOSE, CAPILLARY
GLUCOSE-CAPILLARY: 366 mg/dL — AB (ref 70–99)
GLUCOSE-CAPILLARY: 180 mg/dL — AB (ref 70–99)
GLUCOSE-CAPILLARY: 187 mg/dL — AB (ref 70–99)
GLUCOSE-CAPILLARY: 106 mg/dL — AB (ref 70–99)
GLUCOSE-CAPILLARY: 113 mg/dL — AB (ref 70–99)
Glucose-Capillary: 224 mg/dL — ABNORMAL HIGH (ref 70–99)
Glucose-Capillary: 173 mg/dL — ABNORMAL HIGH (ref 70–99)
Glucose-Capillary: 192 mg/dL — ABNORMAL HIGH (ref 70–99)
Glucose-Capillary: 191 mg/dL — ABNORMAL HIGH (ref 70–99)
Glucose-Capillary: 186 mg/dL — ABNORMAL HIGH (ref 70–99)
Glucose-Capillary: 143 mg/dL — ABNORMAL HIGH (ref 70–99)
Glucose-Capillary: 120 mg/dL — ABNORMAL HIGH (ref 70–99)
Glucose-Capillary: 113 mg/dL — ABNORMAL HIGH (ref 70–99)
Glucose-Capillary: 120 mg/dL — ABNORMAL HIGH (ref 70–99)
Glucose-Capillary: 139 mg/dL — ABNORMAL HIGH (ref 70–99)

## 2013-07-19 LAB — RETICULOCYTES
RBC.: 2.87 MIL/uL — AB (ref 3.87–5.11)
Retic Count, Absolute: 48.8 10*3/uL (ref 19.0–186.0)
Retic Ct Pct: 1.7 % (ref 0.4–3.1)

## 2013-07-19 LAB — CBG MONITORING, ED
GLUCOSE-CAPILLARY: 430 mg/dL — AB (ref 70–99)
Glucose-Capillary: 506 mg/dL — ABNORMAL HIGH (ref 70–99)

## 2013-07-19 LAB — MRSA PCR SCREENING: MRSA BY PCR: POSITIVE — AB

## 2013-07-19 LAB — STREP PNEUMONIAE URINARY ANTIGEN: Strep Pneumo Urinary Antigen: NEGATIVE

## 2013-07-19 LAB — HAPTOGLOBIN: Haptoglobin: 154 mg/dL (ref 45–215)

## 2013-07-19 LAB — BILIRUBIN, FRACTIONATED(TOT/DIR/INDIR)
BILIRUBIN INDIRECT: 0.6 mg/dL (ref 0.3–0.9)
Bilirubin, Direct: 11.1 mg/dL — ABNORMAL HIGH (ref 0.0–0.3)
Total Bilirubin: 11.7 mg/dL — ABNORMAL HIGH (ref 0.3–1.2)

## 2013-07-19 LAB — LACTATE DEHYDROGENASE: LDH: 397 U/L — ABNORMAL HIGH (ref 94–250)

## 2013-07-19 LAB — PHENYTOIN LEVEL, TOTAL: Phenytoin Lvl: 4.9 ug/mL — ABNORMAL LOW (ref 10.0–20.0)

## 2013-07-19 LAB — TROPONIN I: Troponin I: <0.3 ng/mL (ref <0.30)

## 2013-07-19 MED ORDER — FENTANYL CITRATE 0.05 MG/ML IJ SOLN
INTRAMUSCULAR | Status: AC
  Filled 2013-07-19: qty 2

## 2013-07-19 MED ORDER — SODIUM CHLORIDE 0.9 % IJ SOLN
3.0000 mL | Freq: Two times a day (BID) | INTRAMUSCULAR | Status: DC
  Administered 2013-07-19 – 2013-07-27 (×16): 3 mL via INTRAVENOUS

## 2013-07-19 MED ORDER — BIOTENE DRY MOUTH MT LIQD
15.0000 mL | Freq: Two times a day (BID) | OROMUCOSAL | Status: DC
  Administered 2013-07-19 – 2013-07-27 (×16): 15 mL via OROMUCOSAL

## 2013-07-19 MED ORDER — PANTOPRAZOLE SODIUM 40 MG PO TBEC
40.0000 mg | DELAYED_RELEASE_TABLET | Freq: Every day | ORAL | Status: DC
  Administered 2013-07-19 – 2013-07-27 (×9): 40 mg via ORAL
  Filled 2013-07-19 (×7): qty 1

## 2013-07-19 MED ORDER — DEXTROSE 5 % IV SOLN
2.0000 g | Freq: Once | INTRAVENOUS | Status: AC
  Administered 2013-07-19: 2 g via INTRAVENOUS
  Filled 2013-07-19: qty 2

## 2013-07-19 MED ORDER — DEXTROSE-NACL 5-0.45 % IV SOLN
INTRAVENOUS | Status: DC
  Administered 2013-07-19: 06:00:00 via INTRAVENOUS

## 2013-07-19 MED ORDER — MUPIROCIN 2 % EX OINT
1.0000 "application " | TOPICAL_OINTMENT | Freq: Two times a day (BID) | CUTANEOUS | Status: AC
  Administered 2013-07-19 – 2013-07-24 (×10): 1 via NASAL
  Filled 2013-07-19 (×3): qty 22

## 2013-07-19 MED ORDER — CHLORHEXIDINE GLUCONATE CLOTH 2 % EX PADS
6.0000 | MEDICATED_PAD | Freq: Every day | CUTANEOUS | Status: AC
  Administered 2013-07-19 – 2013-07-22 (×4): 6 via TOPICAL

## 2013-07-19 MED ORDER — INSULIN GLARGINE 100 UNIT/ML ~~LOC~~ SOLN
50.0000 [IU] | Freq: Every day | SUBCUTANEOUS | Status: DC
  Administered 2013-07-19 – 2013-07-20 (×2): 50 [IU] via SUBCUTANEOUS
  Filled 2013-07-19 (×2): qty 0.5

## 2013-07-19 MED ORDER — DARUNAVIR ETHANOLATE 800 MG PO TABS
800.0000 mg | ORAL_TABLET | Freq: Every day | ORAL | Status: DC
  Administered 2013-07-19 – 2013-07-27 (×9): 800 mg via ORAL
  Filled 2013-07-19 (×13): qty 1

## 2013-07-19 MED ORDER — RITONAVIR 100 MG PO TABS
100.0000 mg | ORAL_TABLET | Freq: Every day | ORAL | Status: DC
  Administered 2013-07-19 – 2013-07-27 (×9): 100 mg via ORAL
  Filled 2013-07-19 (×12): qty 1

## 2013-07-19 MED ORDER — LEVETIRACETAM 750 MG PO TABS
1500.0000 mg | ORAL_TABLET | Freq: Two times a day (BID) | ORAL | Status: DC
  Administered 2013-07-19 – 2013-07-22 (×6): 1500 mg via ORAL
  Filled 2013-07-19 (×8): qty 2

## 2013-07-19 MED ORDER — ABACAVIR SULFATE 300 MG PO TABS
600.0000 mg | ORAL_TABLET | Freq: Every day | ORAL | Status: DC
  Administered 2013-07-19 – 2013-07-27 (×9): 600 mg via ORAL
  Filled 2013-07-19 (×9): qty 2

## 2013-07-19 MED ORDER — ONDANSETRON HCL 4 MG/2ML IJ SOLN
4.0000 mg | Freq: Four times a day (QID) | INTRAMUSCULAR | Status: DC | PRN
  Administered 2013-07-23: 4 mg via INTRAVENOUS
  Filled 2013-07-19: qty 2

## 2013-07-19 MED ORDER — ACETAMINOPHEN 650 MG RE SUPP
650.0000 mg | Freq: Four times a day (QID) | RECTAL | Status: DC | PRN
  Administered 2013-07-19 – 2013-07-20 (×2): 650 mg via RECTAL
  Filled 2013-07-19: qty 1

## 2013-07-19 MED ORDER — ONDANSETRON HCL 4 MG PO TABS: 4.0000 mg | ORAL_TABLET | Freq: Four times a day (QID) | ORAL | Status: DC | PRN

## 2013-07-19 MED ORDER — ACETAMINOPHEN 325 MG PO TABS
650.0000 mg | ORAL_TABLET | Freq: Once | ORAL | Status: DC
  Filled 2013-07-19: qty 2

## 2013-07-19 MED ORDER — SODIUM CHLORIDE 0.9 % IV SOLN
INTRAVENOUS | Status: DC
  Administered 2013-07-19: 05:00:00 via INTRAVENOUS

## 2013-07-19 MED ORDER — INSULIN GLARGINE 100 UNIT/ML ~~LOC~~ SOLN
60.0000 [IU] | Freq: Every day | SUBCUTANEOUS | Status: DC
  Filled 2013-07-19: qty 0.6

## 2013-07-19 MED ORDER — SODIUM CHLORIDE 0.9 % IV BOLUS (SEPSIS)
1000.0000 mL | Freq: Once | INTRAVENOUS | Status: AC
  Administered 2013-07-19: 1000 mL via INTRAVENOUS

## 2013-07-19 MED ORDER — DEXTROSE 5 % IV SOLN
1.0000 g | Freq: Three times a day (TID) | INTRAVENOUS | Status: DC
  Administered 2013-07-19 – 2013-07-20 (×4): 1 g via INTRAVENOUS
  Filled 2013-07-19 (×6): qty 1

## 2013-07-19 MED ORDER — VANCOMYCIN HCL 10 G IV SOLR
1250.0000 mg | INTRAVENOUS | Status: DC
  Administered 2013-07-19: 1250 mg via INTRAVENOUS
  Filled 2013-07-19: qty 1250

## 2013-07-19 MED ORDER — MIDAZOLAM HCL 2 MG/2ML IJ SOLN
INTRAMUSCULAR | Status: AC
  Filled 2013-07-19: qty 2

## 2013-07-19 MED ORDER — LAMIVUDINE 150 MG PO TABS
150.0000 mg | ORAL_TABLET | Freq: Every day | ORAL | Status: DC
  Administered 2013-07-19: 150 mg via ORAL
  Filled 2013-07-19: qty 1

## 2013-07-19 MED ORDER — INSULIN REGULAR BOLUS VIA INFUSION
0.0000 [IU] | Freq: Three times a day (TID) | INTRAVENOUS | Status: DC
  Filled 2013-07-19: qty 10

## 2013-07-19 MED ORDER — ASPIRIN EC 81 MG PO TBEC
81.0000 mg | DELAYED_RELEASE_TABLET | Freq: Every day | ORAL | Status: DC
  Administered 2013-07-19 – 2013-07-27 (×9): 81 mg via ORAL
  Filled 2013-07-19 (×9): qty 1

## 2013-07-19 MED ORDER — DEXTROSE 50 % IV SOLN: 25.0000 mL | INTRAVENOUS | Status: DC | PRN

## 2013-07-19 MED ORDER — PHENYTOIN 50 MG PO CHEW
200.0000 mg | CHEWABLE_TABLET | Freq: Two times a day (BID) | ORAL | Status: DC
  Administered 2013-07-19 – 2013-07-22 (×6): 200 mg via ORAL
  Filled 2013-07-19 (×8): qty 4

## 2013-07-19 MED ORDER — INSULIN ASPART 100 UNIT/ML ~~LOC~~ SOLN
0.0000 [IU] | Freq: Three times a day (TID) | SUBCUTANEOUS | Status: DC
  Administered 2013-07-20: 20 [IU] via SUBCUTANEOUS
  Administered 2013-07-20 (×2): 11 [IU] via SUBCUTANEOUS
  Administered 2013-07-21: 4 [IU] via SUBCUTANEOUS
  Administered 2013-07-21: 3 [IU] via SUBCUTANEOUS
  Administered 2013-07-22 (×3): 7 [IU] via SUBCUTANEOUS
  Administered 2013-07-23: 4 [IU] via SUBCUTANEOUS
  Administered 2013-07-23: 7 [IU] via SUBCUTANEOUS
  Administered 2013-07-23: 3 [IU] via SUBCUTANEOUS
  Administered 2013-07-24: 4 [IU] via SUBCUTANEOUS
  Administered 2013-07-25 (×2): 3 [IU] via SUBCUTANEOUS
  Administered 2013-07-25 – 2013-07-27 (×3): 4 [IU] via SUBCUTANEOUS

## 2013-07-19 MED ORDER — LAMIVUDINE 10 MG/ML PO SOLN
100.0000 mg | Freq: Every day | ORAL | Status: DC
  Administered 2013-07-20 – 2013-07-27 (×7): 100 mg via ORAL
  Filled 2013-07-19 (×9): qty 10

## 2013-07-19 MED ORDER — HEPARIN SODIUM (PORCINE) 5000 UNIT/ML IJ SOLN
5000.0000 [IU] | Freq: Three times a day (TID) | INTRAMUSCULAR | Status: DC
  Administered 2013-07-19 – 2013-07-27 (×21): 5000 [IU] via SUBCUTANEOUS
  Filled 2013-07-19 (×28): qty 1

## 2013-07-19 MED ORDER — STERILE WATER FOR INJECTION IV SOLN
150.0000 meq | INTRAVENOUS | Status: DC
  Administered 2013-07-19 – 2013-07-21 (×5): 150 meq via INTRAVENOUS
  Filled 2013-07-19 (×8): qty 850

## 2013-07-19 NOTE — Progress Notes (Signed)
Inpatient Diabetes Program Recommendations  AACE/ADA: New Consensus Statement on Inpatient Glycemic Control (2013)  Target Ranges:  Prepandial:   less than 140 mg/dL      Peak postprandial:   less than 180 mg/dL (1-2 hours)      Critically ill patients:  140 - 180 mg/dL   Reason for Visit: Note patient admitted with hyperglycemia. Diabetes history: Type 2 diabetes Outpatient Diabetes medications: Lantus 30 units q HS, Novolog 3 units tid with meals Current orders for Inpatient glycemic control: IV insulin.  Continue IV insulin and transition once CBG's less than 180 mg/dL for 6 hours.  Consider transition to Home Lantus dose once transition criteria met.  Adah Perl, RN, BC-ADM Inpatient Diabetes Coordinator Pager [phone]

## 2013-07-19 NOTE — Progress Notes (Addendum)
INTERNAL MEDICINE TEACHING SERVICE BRIEF PROGRESS NOTE  S: Patient appears to be more awake and interactive.  Is not really sure about previous events that lead her to the hospital.  O: General: resting in bed HEENT: PERRL, EOMI, scleral icterus present Cardiac: RRR, no rubs, murmurs or gallops Pulm: decreased basilar breath sound at right base Abd: soft, tenderness in RUQ, negative murphey's sign, nondistended, BS present Ext: warm and well perfused, 1-2+ pedal edema Neuro: alert and oriented X3, cranial nerves II-XII grossly intact  A/P:  Increased Anion Gap Metabolic Acidosis - Likely secondary to Acute renal insufficiently/ uremia.  Will D/C insulin drip.  Sepsis with concern for HCAP vs Cholecystitis - Patient presented with leukocytosis, fever, tachycardia and tachypnea, she was also hypotensive on arrival. She has a CXR concerning for possible infiltrate, she also has RUQ tenderness and a direct hyperbilirubenemia concerning for biliary obstruction and possible cholecystitis. - Continue Vanc and aztreonam - Follow blood cultures - Repeat CXR with 2 view when able - Abdominal U/S  # Acute Kidney Injury on CKD - Biopsy showed collapsing FSGS.  -Renal consulted - Bicarb drip @125cc /hr -daily weights, I/O's  -hold home lasix for now - Monitor renal function  # Possible cholelithiasis/cholecystitis -Patient with RUQ pain and hyper direct bilirubinemia - Obtain complete abdominal U/S given renal failure and concern for cholecystitis  # Autoimmune Hemolytic Anemia - LDH elevated, bilirubin elevated (however direct and not indirect), hemoglobin stable from discharge.  At this time I do not believe she is actively hemolyzing - CBC in AM   # Diabetes - D/C insulin drip - Lantus 60 units - Carb mod diet  # Hypotension - The patient presents with hypotension, likely due to septic state. The patient has a history of HTN, on home clonidine, lasix, labetalol, lisinopril,  hydralazine.   BP trending up -hold all antihypertensives, given hypotension  -IVF per above   - may need to restart BP medications  # Acute encephalopathy - Improved since this AM (glucose under better control) - Continue to monitor  # HIV - last CD4 = 490 (05/07/13)  -continue abacavir, prezista, lamivudine (renal adjust to 100mg ), norvir   # Seizure disorder - family member notes 2 seizures 3-4 days prior to admission. Dilantin level low on admission (similar to prior admission).  -continue home keppra, dilantin   Dispo: Disposition is deferred at this time, awaiting improvement of current medical problems. Anticipated discharge in approximately 2-4 day(s).  Carlynn Purl, DO 3:25 PM Pager [phone]

## 2013-07-19 NOTE — ED Provider Notes (Signed)
CSN: [id number]     Arrival date & time 07/18/13  2227 History   First MD Initiated Contact with Patient 07/18/13 2300     Chief Complaint  Patient presents with  . Hyperglycemia     (Consider location/radiation/quality/duration/timing/severity/associated sxs/prior Treatment) HPI 53 year old female presents to emergency department via EMS from home with complaint of confusion.  Patient is a diabetic, home meter running high, unsure when she last took insulin.  Patient noted to have fever.  Patient recently admitted to the hospital, from the sixth through the 12th 22 fluid overload, and for diuresis.  Patient has complicated past medical history of HIV, chronic kidney disease, CHF, hepatitis C, history of encephalopathy, prior history of meningitis and seizures.  Patient is confused, and cannot give any further information at this time.  No family is available to give further history. Past Medical History  Diagnosis Date  . Seizures   . Stroke   . Meningitis   . HIV (human immunodeficiency virus infection)   . Hypertension   . Gout   . Muscle spasms of head and/or neck   . CKD (chronic kidney disease)   . CHF (congestive heart failure)     [name] 06/18/2013  . HCV (hepatitis C virus)     chronic/notes 06/18/2013  . Type II diabetes mellitus     [name] 06/18/2013  . AIHA (autoimmune hemolytic anemia)     [name] 06/18/2013  . Hypertensive encephalopathy ~ 05/2013    hospitalaized/notes 06/18/2013  . Daily headache     "for the last 6 years/notes 06/18/2013  . Exertional shortness of breath     [name] 06/18/2013  . Anxiety     [name] 06/18/2013  . Nephrotic syndrome   . History of syphilis   . High cholesterol    Past Surgical History  Procedure Laterality Date  . Hip pinning Right    Family History  Problem Relation Age of Onset  . Cancer - Colon Mother   . Cancer Father   . Diabetes    . Hypertension Father    History  Substance Use Topics  . Smoking status: Former Smoker     Types: Cigarettes    Quit date: 06/19/2010  . Smokeless tobacco: Never Used  . Alcohol Use: No   OB History   Grav Para Term Preterm Abortions TAB SAB Ect Mult Living                 Review of Systems  Unable to perform ROS: Mental status change      Allergies  Ceftriaxone; Norvasc; and Morphine and related  Home Medications   Current Outpatient Rx  Name  Route  Sig  Dispense  Refill  . abacavir (ZIAGEN) 300 MG tablet   Oral   Take 2 tablets (600 mg total) by mouth daily.   60 tablet   6   . acyclovir (ZOVIRAX) 800 MG tablet   Oral   Take 1 tablet (800 mg total) by mouth daily.   30 tablet   3   . aspirin EC 81 MG tablet   Oral   Take 1 tablet (81 mg total) by mouth daily.   30 tablet   11   . Blood Glucose Monitoring Suppl (ACCU-CHEK AVIVA PLUS) W/DEVICE KIT      USE AS DIRECTED   1 kit   0   . cholecalciferol (VITAMIN D) 1000 UNITS tablet   Oral   Take 1,000 Units by mouth daily.         [name]  cloNIDine (CATAPRES) 0.2 MG tablet   Oral   Take 1 tablet (0.2 mg total) by mouth 2 (two) times daily.   60 tablet   3   . Darunavir Ethanolate (PREZISTA) 800 MG tablet   Oral   Take 1 tablet (800 mg total) by mouth daily with breakfast.   30 tablet   6   . furosemide (LASIX) 80 MG tablet   Oral   Take 2 tablets (160 mg total) by mouth 2 (two) times daily.   120 tablet   0   . gentamicin (GARAMYCIN) 0.3 % ophthalmic ointment   Both Eyes   Place 1 application into both eyes 4 (four) times daily.         [name] glucose blood (ACCU-CHEK AVIVA) test strip      Use as instructed   100 each   12   . hydrALAZINE (APRESOLINE) 50 MG tablet   Oral   Take 50 mg by mouth 3 (three) times daily.         . insulin aspart (NOVOLOG) 100 UNIT/ML injection   Subcutaneous   Inject into the skin 3 (three) times daily before meals. Home Sliding scale         . insulin glargine (LANTUS) 100 UNIT/ML injection   Subcutaneous   Inject 30 Units into the skin at  bedtime.         [name] labetalol (NORMODYNE) 100 MG tablet   Oral   Take 1 tablet (100 mg total) by mouth 2 (two) times daily.   60 tablet   3   . lamiVUDine (EPIVIR) 150 MG tablet   Oral   Take 1 tablet (150 mg total) by mouth daily.   30 tablet   6   . levETIRAcetam (KEPPRA) 500 MG tablet   Oral   Take 3 tablets (1,500 mg total) by mouth every 12 (twelve) hours.   180 tablet   3   . lisinopril (PRINIVIL,ZESTRIL) 20 MG tablet   Oral   Take 1 tablet (20 mg total) by mouth daily.   30 tablet   3   . LORazepam (ATIVAN) 1 MG tablet   Oral   Take 1 mg by mouth daily. Take one tablet for seizure prevention.         . methocarbamol (ROBAXIN) 500 MG tablet   Oral   Take 500 mg by mouth every 6 (six) hours as needed for muscle spasms.         . Oxycodone HCl 10 MG TABS   Oral   Take 1 tablet (10 mg total) by mouth every 6 (six) hours as needed (severe pain).   12 tablet   0   . pantoprazole (PROTONIX) 40 MG tablet   Oral   Take 1 tablet (40 mg total) by mouth daily.   30 tablet   11   . phenytoin (DILANTIN) 50 MG tablet   Oral   Chew 4 tablets (200 mg total) by mouth 2 (two) times daily.   180 tablet   3   . predniSONE (DELTASONE) 10 MG tablet      Take 20 mg daily with breakfast for three days (Friday, 07/12/2013 to 07/14/2013) and then  Take 10 mg daily with breakfast for three days (Monday 07/15/2013 to 07/17/2013) and stop   9 tablet   0   . ritonavir (NORVIR) 100 MG TABS tablet   Oral   Take 100 mg by mouth daily with breakfast.  BP 90/44  Pulse 112  Temp(Src) 101.1 F (38.4 C) (Oral)  Resp 32  Wt 182 lb (82.555 kg)  SpO2 98% Physical Exam  Nursing note and vitals reviewed. Constitutional: She appears well-developed and well-nourished. She appears distressed.  Ill-appearing female, confused  HENT:  Head: Normocephalic and atraumatic.  Right Ear: External ear normal.  Left Ear: External ear normal.  Nose: Nose normal.  Dry mucous  membranes  Eyes: Conjunctivae and EOM are normal. Pupils are equal, round, and reactive to light.  Neck: Normal range of motion. Neck supple. No JVD present. No tracheal deviation present. No thyromegaly present.  Cardiovascular: Regular rhythm and intact distal pulses.  Exam reveals no gallop and no friction rub.   Murmur heard. Tachycardia with murmur  Pulmonary/Chest: Effort normal and breath sounds normal. No stridor. No respiratory distress. She has no wheezes. She has no rales. She exhibits no tenderness.  Cough noted  Abdominal: Soft. Bowel sounds are normal. She exhibits no distension and no mass. There is no tenderness. There is no rebound and no guarding.  Musculoskeletal: Normal range of motion. She exhibits edema. She exhibits no tenderness.  Lymphadenopathy:    She has no cervical adenopathy.  Neurological: She is alert. No cranial nerve deficit. She exhibits normal muscle tone. Coordination normal.  Skin: Skin is warm and dry. No rash noted. No erythema. No pallor.    ED Course  Procedures (including critical care time)  CRITICAL CARE Performed by: Kalman Drape Total critical care time:30 min Critical care time was exclusive of separately billable procedures and treating other patients. Critical care was necessary to treat or prevent imminent or life-threatening deterioration. Critical care was time spent personally by me on the following activities: development of treatment plan with patient and/or surrogate as well as nursing, discussions with consultants, evaluation of patient's response to treatment, examination of patient, obtaining history from patient or surrogate, ordering and performing treatments and interventions, ordering and review of laboratory studies, ordering and review of radiographic studies, pulse oximetry and re-evaluation of patient's condition.  Labs Review Labs Reviewed  CBC WITH DIFFERENTIAL - Abnormal; Notable for the following:    WBC 20.7 (*)     RBC 2.73 (*)    Hemoglobin 9.3 (*)    HCT 26.8 (*)    MCH 34.1 (*)    RDW 18.1 (*)    Neutrophils Relative % 86 (*)    Lymphocytes Relative 6 (*)    Neutro Abs 17.8 (*)    Monocytes Absolute 1.7 (*)    All other components within normal limits  COMPREHENSIVE METABOLIC PANEL - Abnormal; Notable for the following:    Sodium 132 (*)    Chloride 93 (*)    CO2 17 (*)    Glucose, Bld 500 (*)    BUN 123 (*)    Creatinine, Ser 4.23 (*)    Calcium 8.0 (*)    Albumin 1.8 (*)    ALT 38 (*)    Alkaline Phosphatase 297 (*)    Total Bilirubin 10.8 (*)    GFR calc non Af Amer 11 (*)    GFR calc Af Amer 13 (*)    All other components within normal limits  I-STAT CHEM 8, ED - Abnormal; Notable for the following:    Sodium 130 (*)    BUN 124 (*)    Creatinine, Ser 4.60 (*)    Glucose, Bld 524 (*)    Calcium, Ion 0.96 (*)    Hemoglobin 10.5 (*)  HCT 31.0 (*)    All other components within normal limits  CBG MONITORING, ED - Abnormal; Notable for the following:    Glucose-Capillary 506 (*)    All other components within normal limits  CULTURE, BLOOD (ROUTINE X 2)  CULTURE, BLOOD (ROUTINE X 2)  URINALYSIS, ROUTINE W REFLEX MICROSCOPIC  PHENYTOIN LEVEL, TOTAL  I-STAT CG4 LACTIC ACID, ED   Imaging Review Dg Chest Port 1 View  07/19/2013   CLINICAL DATA:  Fever.  Altered mental status.  EXAM: PORTABLE CHEST - 1 VIEW  COMPARISON:  07/05/2013  FINDINGS: Increasing opacity at the right lung base medially concerning for pneumonia. Left lung is clear. Heart is normal size. No effusions or acute bony abnormality.  IMPRESSION: Right medial basilar airspace opacity concerning for pneumonia.   Electronically Signed   By: Rolm Baptise M.D.   On: 07/19/2013 00:16    EKG Interpretation    Date/Time:  Thursday July 18 2013 22:46:25 EST Ventricular Rate:  125 PR Interval:  120 QRS Duration: 86 QT Interval:  327 QTC Calculation: 471 R Axis:   -11 Text Interpretation:  Sinus tachycardia  Probable left atrial enlargement Left ventricular hypertrophy Since last tracing rate faster Confirmed by Arlynn Mcdermid  MD, Haylei Cobin ([id number]) on 07/18/2013 11:19:08 PM            MDM   Final diagnoses:  DKA (diabetic ketoacidoses)  HCAP (healthcare-associated pneumonia)  Acute renal failure  Encephalopathy  HIV (human immunodeficiency virus infection)   53 year old female, who presents with altered mental status, found to have fever, tachycardia, and probable DKA with anion gap of 22.  Chest x-[place] shows possible pneumonia.  Recent hospitalization requires treatment for hospital-acquired pneumonia.  She's been started on glucose, stabilizer.  She is receiving gentle hydration given her history of CHF, it is noted that her creatinine has doubled since her last hospitalization.  Patient lactic acid is normal, which is somewhat reassuring.  She does have a stable blood pressure.  Patient however, is ill, and has little reserve.  Will discuss with her inpatient team for readmission and careful monitoring.  Antibiotics have been ordered.    Kalman Drape, MD 07/19/13 [phone]

## 2013-07-19 NOTE — Progress Notes (Signed)
Utilization review completed. Rochelle Larue, RN, BSN. 

## 2013-07-19 NOTE — Progress Notes (Signed)
Text paged MD the blood culture preliminary report of gram negative rods in 1 anaerobic bottle drawn from patient's right hand.

## 2013-07-19 NOTE — H&P (Signed)
  Date: 07/19/2013  Patient name: Michelle [name]  Medical record number: [id number]  Date of birth: 04-26-1961   I have seen and evaluated [name] and discussed their care with the Residency Team.   Assessment and Plan: I have seen and evaluated the patient as outlined above. I agree with the formulated Assessment and Plan as detailed in the residents' admission note.  Gardiner Barefoot, MD 2/20/201512:54 PM

## 2013-07-19 NOTE — Progress Notes (Addendum)
ANTIBIOTIC CONSULT NOTE - INITIAL  Pharmacy Consult for Vancomycin/Aztreonam  Indication: r/o HCAP  Allergies  Allergen Reactions  . 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 Norvasc [Amlodipine Besylate]     Itching, rash , hives .   [name]. Morphine And Related Hives, Itching and Rash    Patient Measurements: Weight: 182 lb (82.555 kg)  Vital Signs: Temp: 101.1 F (38.4 C) (02/19 2236) Temp src: Oral (02/19 2236) BP: 90/44 mmHg (02/20 0045) Pulse Rate: 112 (02/20 0045) Labs:  Recent Labs  07/18/13 2238 07/18/13 2312  WBC 20.7*  --   HGB 9.3* 10.5*  PLT 159  --   CREATININE 4.23* 4.60*    Microbiology: Recent Results (from the past 720 hour(s))  URINE CULTURE     Status: None   Collection Time    07/02/13  5:12 PM      Result Value Ref Range Status   Specimen Description URINE, CLEAN CATCH   Final   Special Requests Immunocompromised   Final   Culture  Setup Time     Final   Value: 07/02/2013 19:28     Performed at [facility]   Colony Count     Final   Value: 40,000 COLONIES/ML     Performed at [facility]   Culture     Final   Value: Multiple bacterial morphotypes present, none predominant. Suggest appropriate recollection if clinically indicated.     Performed at [facility]   Report Status 07/03/2013 FINAL   Final  URINE CULTURE     Status: None   Collection Time    07/05/13 11:52 PM      Result Value Ref Range Status   Specimen Description URINE, RANDOM   Final   Special Requests Immunocompromised   Final   Culture  Setup Time     Final   Value: 07/06/2013 06:13     Performed at [facility]   Colony Count     Final   Value: 45,000 COLONIES/ML     Performed at [facility]   Culture     Final   Value: Multiple bacterial morphotypes present, none predominant. Suggest appropriate recollection if clinically indicated.     Performed at [facility]   Report  Status 07/07/2013 FINAL   Final    Medical History: Past Medical History  Diagnosis Date  . Seizures   . Stroke   . Meningitis   . HIV (human immunodeficiency virus infection)   . Hypertension   . Gout   . Muscle spasms of head and/or neck   . CKD (chronic kidney disease)   . CHF (congestive heart failure)     [name]/notes 06/18/2013  . HCV (hepatitis C virus)     chronic/notes 06/18/2013  . Type II diabetes mellitus     [name]/notes 06/18/2013  . AIHA (autoimmune hemolytic anemia)     [name]/notes 06/18/2013  . Hypertensive encephalopathy ~ 05/2013    hospitalaized/notes 06/18/2013  . Daily headache     "for the last 6 years/notes 06/18/2013  . Exertional shortness of breath     [name]/notes 06/18/2013  . Anxiety     [name]/notes 06/18/2013  . Nephrotic syndrome   . History of syphilis   . High cholesterol    Assessment: 53 y/o F with recent admission back with possible PNA on CXR. WBC 20.7, acute on chronic renal failure with Scr 4.60, other labs as  above.   Goal of Therapy:  Vancomycin trough level 15-20 mcg/ml  Plan:  -Vancomycin 1250 mg IV q48h for now, could check random vancomycin level before second dose or with any significant changes in renal function  -Aztreonam 2g IV x 1 ordered by EDP, will need to f/u additional gram negative coverage -Trend WBC, temp, renal function  -Drug levels as indicated   Abran Duke 07/19/2013,12:58 AM  Addendum 3:55 AM Adding aztreonam per pharmacy -Aztreonam 1g IV q8h Wilmer Floor, PharmD

## 2013-07-19 NOTE — Procedures (Signed)
Trialysis Hemodialysis Catheter Insertion Procedure Note Michelle [name] [id number] [birth year]  Procedure: Insertion of Trialysis Hemodialysis catheter. Indications: Drug and/or fluid administration, hemodialysis access likely Requested  By renal  Procedure Details Consent: yes Time Out: Verified patient identification, verified procedure, site/side was marked, verified correct patient position, special equipment/implants available, medications/allergies/relevent history reviewed, required imaging and test results available.    Maximum sterile technique was used including antiseptics, cap, gloves, gown, hand hygiene, mask and sheet. Skin prep: Chlorhexidine; local anesthetic administered A antimicrobial bonded/coated triple lumen trialysis catheter was placed in the right femoral vein due to patient being a dialysis (and slavage uppers ij per renal)  patient using the Seldinger technique. Ultrasound guidance used.yes Catheter placed to 20 cm. Blood aspirated via all 3 ports and then flushed x 3. Line sutured x 2 and dressing applied.  Evaluation Blood flow good Complications: No apparent complications Patient did tolerate procedure well.  Rhea Pink PA-S 07/19/2013 1105 US guidance  Mcarthur Rossetti. Tyson Alias, MD, FACP Pgr: [phone] [facility]

## 2013-07-19 NOTE — Progress Notes (Signed)
Nutrition Brief Note  Pt was identified at nutrition risk by the malnutrition screening tool.  53 year old woman, with past medical history of dCHF (EF normal, with Grade-2 diastolic dysfunction), HIV (CD4 490 and VL<20 on 05/07/13), chronic HCV, seizure disorder, hx of stroke, HTN, T2DM, CKD, gout, AIHA, and increasing bilateral lower extremity edema with fluid drainage from her legs. Patient reports that her legs have been swollen over the last 1 month. However, the swelling has increased in the past 2-3 weeks with increasing clear, yellow fluid drainage coming from bilateral shins. Superficial ulcerations of her legs after repeated scratching.   Unable to obtain nutrition hx from pt as pt was unresponsive to questions (besides slight head nods that were hard to distinguish), however was awake. Per RN, there is difficulty communicating with the pt. Pt did have a slight head nod yes when asked about if appetite was good at home. Pt with significant weight loss, but likely due to fluid loss as observed usual body weight ~180 lb). No findings of significant fat and muscle mass depletion. Noted at prior admission/hospitalization meal completion was 100%.  No nutrition intervention at this time.   Nutrition Focused Physical Exam:  Subcutaneous Fat:  Orbital Region: N/A Upper Arm Region: WNL Thoracic and Lumbar Region: WNL  Muscle:  Temple Region: WNL Clavicle Bone Region: WNL Clavicle and Acromion Bone Region: WNL Scapular Bone Region: N/A Dorsal Hand: WNL Patellar Region: WNL Anterior Thigh Region: WNL Posterior Calf Region: WNL  Edema: + 2 lower extremetiy  Height:  Ht Readings from Last 1 Encounters:   07/19/13  5' 5.5" (1.664 m)    Weight:  Wt Readings from Last 1 Encounters:   07/19/13  179 lb 0.2 oz (81.2 kg)    Wt Readings from Last 50 Encounters:  07/19/13 179 lb 0.2 oz (81.2 kg)  07/11/13 182 lb 1.6 oz (82.6 kg)  07/05/13 203 lb 1.6 oz (92.126 kg)  07/02/13 203 lb (92.08  kg)  07/01/13 208 lb 6.4 oz (94.53 kg)  06/27/13 207 lb 14.4 oz (94.303 kg)  06/26/13 209 lb 8 oz (95.029 kg)  06/20/13 198 lb 3.1 oz (89.9 kg)  06/18/13 212 lb 1.6 oz (96.208 kg)  06/11/13 204 lb 14.4 oz (92.942 kg)  06/02/13 183 lb 3.2 oz (83.099 kg)  05/21/13 198 lb 8 oz (90.039 kg)  05/20/13 199 lb 12.8 oz (90.629 kg)  05/16/13 176 lb 11.2 oz (80.151 kg)  03/30/13 187 lb (84.823 kg)  03/04/13 182 lb (82.555 kg)  02/15/13 193 lb 3.2 oz (87.635 kg)    BMI: Body mass index is 29.33 kg/(m^2).   Marijean Niemann Dietetic Intern Pager: [phone]  I agree with student dietitian note; appropriate revisions have been made.  Joaquin Courts, RD, LDN, CNSC Pager# [phone] After Hours Pager# [phone]

## 2013-07-19 NOTE — Significant Event (Signed)
Patient has been transferred to [facility], room 12 traveled via bed. VS stable prior and during the transfer. Patient's sister aware of the transfer. Only personal belongings are clothes which are taken to her new room. Report given to [place], Charity fundraiser. Mahagony Grieb, Charity fundraiser.

## 2013-07-19 NOTE — Consult Note (Signed)
Reason for Consult:AKI/CKD, metabolic acidosis Referring Physician: Linus Salmons, MD  Michelle [name] is an 53 y.o. female.  HPI: 14F AAF with multiple medical problems including CHF (diastolic), HIV, HCV, auotimmune hemolytic anemia, DM, HTN, Sz disorder (2 recent seizures) and also had nephrotic syndrome during her last hospitalization 2 weeks ago.  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  She was due for f/u with Dr. Joelyn Oms today, however was admitted yesterday with AMS, chills, polydipsia, polyphagia, and work up revealed hyperglycemia, AKI/CKD, and profound metabolic acidosis. SCr trend below.  We were asked to help further evaluate and manage her AKI/CKD and multiple metabolic derangements.  Of note, she has been on an ACE Inhibitor PTA and was hypotensive on presentation and is currently very somnolent and unable to remain awake long enough to answer more than one word answers.    Trend in Creatinine: Creatinine, Ser  Date/Time Value Ref Range Status  07/19/2013  5:25 AM 4.12* 0.50 - 1.10 mg/dL Final  07/18/2013 11:12 PM 4.60* 0.50 - 1.10 mg/dL Final  07/18/2013 10:38 PM 4.23* 0.50 - 1.10 mg/dL Final  07/11/2013  7:01 AM 2.38* 0.50 - 1.10 mg/dL Final  07/10/2013  4:24 AM 2.38* 0.50 - 1.10 mg/dL Final  07/09/2013  4:30 PM 2.46* 0.50 - 1.10 mg/dL Final  07/09/2013  3:40 AM 2.24* 0.50 - 1.10 mg/dL Final  07/08/2013  4:00 PM 2.12* 0.50 - 1.10 mg/dL Final  07/08/2013  5:36 AM 2.30* 0.50 - 1.10 mg/dL Final  07/07/2013  8:55 PM 2.23* 0.50 - 1.10 mg/dL Final  07/07/2013  3:29 PM 2.21* 0.50 - 1.10 mg/dL Final  07/07/2013  5:15 AM 2.21* 0.50 - 1.10 mg/dL Final  07/06/2013  6:20 PM 2.33* 0.50 - 1.10 mg/dL Final  07/06/2013  5:25 AM 2.49* 0.50 - 1.10 mg/dL Final  07/05/2013  9:10 PM 2.28* 0.50 - 1.10 mg/dL Final  07/02/2013  1:46 PM 2.10* 0.50 - 1.10 mg/dL Final  06/28/2013  2:43 PM 2.00* 0.50 - 1.10 mg/dL Final   06/26/2013 12:05 PM 2.13* 0.50 - 1.10 mg/dL Final  06/20/2013  5:15 AM 1.75* 0.50 - 1.10 mg/dL Final  06/19/2013  6:41 PM 1.73* 0.50 - 1.10 mg/dL Final  06/19/2013  5:00 AM 1.78* 0.50 - 1.10 mg/dL Final  06/18/2013 12:10 PM 1.95* 0.50 - 1.10 mg/dL Final  06/18/2013  9:10 AM 1.95* 0.50 - 1.10 mg/dL Final  06/11/2013  2:59 PM 2.02* 0.50 - 1.10 mg/dL Final  06/02/2013  5:35 AM 0.90  0.50 - 1.10 mg/dL Final  06/01/2013  5:48 AM 1.98* 0.50 - 1.10 mg/dL Final  05/31/2013  6:30 AM 2.25* 0.50 - 1.10 mg/dL Final  05/29/2013  4:30 AM 1.55* 0.50 - 1.10 mg/dL Final  05/28/2013 10:46 AM 1.61* 0.50 - 1.10 mg/dL Final  05/21/2013  5:06 PM 1.47* 0.50 - 1.10 mg/dL Final  05/14/2013 11:50 AM 1.92* 0.50 - 1.10 mg/dL Final  05/13/2013  4:33 AM 2.09* 0.50 - 1.10 mg/dL Final  05/12/2013  4:05 AM 2.08* 0.50 - 1.10 mg/dL Final  05/11/2013  5:25 AM 2.04* 0.50 - 1.10 mg/dL Final  05/10/2013  8:45 AM 1.73* 0.50 - 1.10 mg/dL Final  02/16/2013  6:10 AM 1.37* 0.50 - 1.10 mg/dL Final  02/14/2013 10:19 PM 1.90* 0.50 - 1.10 mg/dL Final  02/14/2013 10:15 PM 1.74* 0.50 - 1.10 mg/dL Final    PMH:   Past Medical History  Diagnosis Date  . Seizures   .  Stroke   . Meningitis   . HIV (human immunodeficiency virus infection)   . Hypertension   . Gout   . Muscle spasms of head and/or neck   . CKD (chronic kidney disease)   . CHF (congestive heart failure)     [name] 06/18/2013  . HCV (hepatitis C virus)     chronic/notes 06/18/2013  . Type II diabetes mellitus     [name] 06/18/2013  . AIHA (autoimmune hemolytic anemia)     [name] 06/18/2013  . Hypertensive encephalopathy ~ 05/2013    hospitalaized/notes 06/18/2013  . Daily headache     "for the last 6 years/notes 06/18/2013  . Exertional shortness of breath     [name] 06/18/2013  . Anxiety     [name] 06/18/2013  . Nephrotic syndrome   . History of syphilis   . High cholesterol     PSH:   Past Surgical History  Procedure Laterality Date  . Hip pinning Right      Allergies:  Allergies  Allergen Reactions  . 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 Norvasc [Amlodipine Besylate]     Itching, rash , hives .   [name] Morphine And Related Hives, Itching and Rash    Medications:   Prior to Admission medications   Medication Sig Start Date End Date Taking? Authorizing Provider  abacavir (ZIAGEN) 300 MG tablet Take 2 tablets (600 mg total) by mouth daily. 06/20/13   Lesly Dukes, MD  acyclovir (ZOVIRAX) 800 MG tablet Take 1 tablet (800 mg total) by mouth daily. 06/02/13   Lesly Dukes, MD  aspirin EC 81 MG tablet Take 1 tablet (81 mg total) by mouth daily. 06/02/13   Lesly Dukes, MD  Blood Glucose Monitoring Suppl (ACCU-CHEK AVIVA PLUS) W/DEVICE KIT USE AS DIRECTED 05/24/13   Dominic Pea, DO  cholecalciferol (VITAMIN D) 1000 UNITS tablet Take 1,000 Units by mouth daily.    Historical Provider, MD  cloNIDine (CATAPRES) 0.2 MG tablet Take 1 tablet (0.2 mg total) by mouth 2 (two) times daily. 06/02/13   Lesly Dukes, MD  Darunavir Ethanolate (PREZISTA) 800 MG tablet Take 1 tablet (800 mg total) by mouth daily with breakfast. 06/20/13   Lesly Dukes, MD  furosemide (LASIX) 80 MG tablet Take 2 tablets (160 mg total) by mouth 2 (two) times daily. 07/11/13   Jessee Avers, MD  gentamicin (GARAMYCIN) 0.3 % ophthalmic ointment Place 1 application into both eyes 4 (four) times daily.    Historical Provider, MD  glucose blood (ACCU-CHEK AVIVA) test strip Use as instructed 06/13/13   Clinton Gallant, MD  hydrALAZINE (APRESOLINE) 50 MG tablet Take 50 mg by mouth 3 (three) times daily.    Historical Provider, MD  insulin aspart (NOVOLOG) 100 UNIT/ML injection Inject into the skin 3 (three) times daily before meals. Home Sliding scale    Historical Provider, MD  insulin glargine (LANTUS) 100 UNIT/ML injection Inject 30 Units into the skin at bedtime.    Historical Provider, MD  labetalol (NORMODYNE) 100 MG tablet Take 1 tablet (100 mg  total) by mouth 2 (two) times daily. 06/02/13   Lesly Dukes, MD  lamiVUDine (EPIVIR) 150 MG tablet Take 1 tablet (150 mg total) by mouth daily. 06/20/13   Lesly Dukes, MD  levETIRAcetam (KEPPRA) 500 MG tablet Take 3 tablets (1,500 mg total) by mouth every 12 (twelve) hours. 06/02/13   Lesly Dukes, MD  lisinopril (PRINIVIL,ZESTRIL) 20 MG tablet Take 1 tablet (20 mg total)  by mouth daily. 07/11/13   Jessee Avers, MD  LORazepam (ATIVAN) 1 MG tablet Take 1 mg by mouth daily. Take one tablet for seizure prevention.    Historical Provider, MD  methocarbamol (ROBAXIN) 500 MG tablet Take 500 mg by mouth every 6 (six) hours as needed for muscle spasms.    Historical Provider, MD  Oxycodone HCl 10 MG TABS Take 1 tablet (10 mg total) by mouth every 6 (six) hours as needed (severe pain). 07/02/13   Kaitlyn Szekalski, PA-C  pantoprazole (PROTONIX) 40 MG tablet Take 1 tablet (40 mg total) by mouth daily. 06/02/13   Lesly Dukes, MD  phenytoin (DILANTIN) 50 MG tablet Chew 4 tablets (200 mg total) by mouth 2 (two) times daily. 07/11/13   Jessee Avers, MD  predniSONE (DELTASONE) 10 MG tablet Take 20 mg daily with breakfast for three days (Friday, 07/12/2013 to 07/14/2013) and then  Take 10 mg daily with breakfast for three days (Monday 07/15/2013 to 07/17/2013) and stop 07/11/13   Jessee Avers, MD  ritonavir (NORVIR) 100 MG TABS tablet Take 100 mg by mouth daily with breakfast.    Historical Provider, MD    Inpatient medications: . abacavir  600 mg Oral Daily  . acetaminophen  650 mg Oral Once  . antiseptic oral rinse  15 mL Mouth Rinse BID  . aspirin EC  81 mg Oral Daily  . aztreonam  1 g Intravenous 3 times per day  . Chlorhexidine Gluconate Cloth  6 each Topical Q0600  . Darunavir Ethanolate  800 mg Oral Q breakfast  . heparin  5,000 Units Subcutaneous 3 times per day  . insulin regular  0-10 Units Intravenous TID WC  . lamiVUDine  150 mg Oral Daily  . levETIRAcetam  1,500 mg Oral Q12H  . mupirocin ointment  1  application Nasal BID  . pantoprazole  40 mg Oral Daily  . phenytoin  200 mg Oral BID  . ritonavir  100 mg Oral Q breakfast  . sodium chloride  3 mL Intravenous Q12H  . vancomycin  1,250 mg Intravenous Q48H    Discontinued Meds:  There are no discontinued medications.  Social History:  reports that she quit smoking about 3 years ago. Her smoking use included Cigarettes. She smoked 0.00 packs per day. She has never used smokeless tobacco. She reports that she does not drink alcohol or use illicit drugs.  Family History:   Family History  Problem Relation Age of Onset  . Cancer - Colon Mother   . Cancer Father   . Diabetes    . Hypertension Father     Pertinent items are noted in HPI. Weight change:   Intake/Output Summary (Last 24 hours) at 07/19/13 0847 Last data filed at 07/19/13 0800  Gross per 24 hour  Intake 758.61 ml  Output    225 ml  Net 533.61 ml   BP 115/51  Pulse 113  Temp(Src) 100.6 F (38.1 C) (Oral)  Resp 34  Ht 5' 5.5" (1.664 m)  Wt 81.2 kg (179 lb 0.2 oz)  BMI 29.33 kg/m2  SpO2 98% Filed Vitals:   07/19/13 0730 07/19/13 0745 07/19/13 0800 07/19/13 0837  BP: 105/51 105/57 115/51   Pulse: 117 117 113   Temp:    100.6 F (38.1 C)  TempSrc:    Oral  Resp: 33 30 34   Height:      Weight:      SpO2: 96% 98% 98%      General appearance: somnolent but  arousable for one word answers Head: Normocephalic, without obvious abnormality, atraumatic Eyes: positive findings: sclera icterus Resp: occ rhonchi Cardio: tachycardic, no rub GI: +BS, soft, mild tenderness to RUQ on deep palpation, no guarding/rebound Extremities: edema 1+ pretib edema Neuro: somnolent but arousable and oriented to person and place  Labs: Basic Metabolic Panel:  Recent Labs Lab 07/18/13 2238 07/18/13 2312 07/19/13 0525  NA 132* 130* 134*  K 4.5 4.3 4.7  CL 93* 98 94*  CO2 17*  --  14*  GLUCOSE 500* 524* 181*  BUN 123* 124* 116*  CREATININE 4.23* 4.60* 4.12*  ALBUMIN  1.8*  --   --   CALCIUM 8.0*  --  8.3*   Liver Function Tests:  Recent Labs Lab 07/18/13 2238 07/19/13 0525  AST 30  --   ALT 38*  --   ALKPHOS 297*  --   BILITOT 10.8* 11.7*  PROT 6.2  --   ALBUMIN 1.8*  --    No results found for this basename: LIPASE, AMYLASE,  in the last 168 hours No results found for this basename: AMMONIA,  in the last 168 hours CBC:  Recent Labs Lab 07/18/13 2238 07/18/13 2312 07/19/13 0525  WBC 20.7*  --  17.6*  NEUTROABS 17.8*  --   --   HGB 9.3* 10.5* 9.7*  HCT 26.8* 31.0* 28.2*  MCV 98.2  --  98.3  PLT 159  --  157   PT/INR: @LABRCNTIP (inr:5) Cardiac Enzymes: ) Recent Labs Lab 07/19/13 0525  TROPONINI <0.30   CBG:  Recent Labs Lab 07/19/13 0048 07/19/13 0216  GLUCAP 506* 430*    Iron Studies: No results found for this basename: IRON, TIBC, TRANSFERRIN, FERRITIN,  in the last 168 hours  Xrays/Other Studies: Dg Chest Port 1 View  07/19/2013   CLINICAL DATA:  Fever.  Altered mental status.  EXAM: PORTABLE CHEST - 1 VIEW  COMPARISON:  07/05/2013  FINDINGS: Increasing opacity at the right lung base medially concerning for pneumonia. Left lung is clear. Heart is normal size. No effusions or acute bony abnormality.  IMPRESSION: Right medial basilar airspace opacity concerning for pneumonia.   Electronically Signed   By: Rolm Baptise M.D.   On: 07/19/2013 00:16     Assessment/Plan: 1.  AKI/CKD- in setting of SIRS/hypotension and ACE-I.  She unfortunately has a poor prognosis for renal survival with collapsing FSGS but the current episode is likely ischemic ATN and hopefully reversible.  Given her multiple comorbidities, would like to keep off of dialysis for as long as possible.  She will also require permanent access placement when stable as well as education. 1. Cont to hold ACE/ARB 2. SIRS- on broad spectrum abx, no pressors for now 3. Metabolic acidosis- due to #1 4. Collapsing FSGS- would not add steroids as risks outweigh  benefits and hold ACE/ARB given AKI 5. AIHA- elevated bili, follow H/H 6. Hyperglycemia- improved with insulin 7. AMS- ?post-ictal vs. Metabolic encephalopathy. Will cont to follow 8. HIV- cont with meds 9. HCV- per ID 10. HTN- stable   Kohan Azizi A 07/19/2013, 8:47 AM

## 2013-07-19 NOTE — H&P (Signed)
Date: 07/19/2013               Patient Name:  Michelle [name] MRN: [id number]  DOB: 1961/02/06 Age / Sex: 53 y.o., female   PCP: Clinton Gallant, MD         Medical Service: Internal Medicine Teaching Service         Attending Physician: Dr. Scharlene Gloss    First Contact: Dr. Heber [place] Pager: [phone]  Second Contact: Dr. Alice Rieger Pager: [phone]       After Hours (After 5p/  First Contact Pager: [phone]  weekends / holidays): Second Contact Pager: [phone]   Chief Complaint: confusion    History of Present Illness:   Michelle [name] is a 53 year old woman with past medical history of dCHF (EF normal, with Grade-2 diastolic dysfunction), HIV (CD4 59 and VL<20 on 05/07/13), chronic HCV, seizure disorder, hx of stroke, HTN, insulin dependent Type II DM, CKD, gout, and AIHA who presents with altered mental status.   History obtained via telephone from her cousin.  Patient was recently hospitalized from 2/6-2/12 for nephrotic syndrome and was doing well until Monday night when she was reported to have a seizure and also again on Tuesday night. Per cousin it was questionable if she was correctly taking her medications this past week (including seizure medications). She was receiving her insulin regimen but with high blood glucose levels on Monday (>600) and again today since she reportably did not take it last night or today. She was taking her prednisone taper that was prescribed on hospital discharge for AIHA. She has had good PO intake but with somnolence, chills, polydipsia, and polyuria. She also had a recent fall as her cousin found her fallen from the bed today with no reported head injury. No recent alcohol or drug use. She takes oxycodone as needed for pain but she does not suspect she was taking more than normal.          Meds: Current Facility-Administered Medications  Medication Dose Route Frequency Provider Last Rate Last Dose  . insulin regular (NOVOLIN R,HUMULIN R) 1 Units/mL in sodium  chloride 0.9 % 100 mL infusion   Intravenous Continuous Kalman Drape, MD 4.4 mL/hr at 07/19/13 0058 4.4 Units/hr at 07/19/13 0058  . vancomycin (VANCOCIN) 1,250 mg in sodium chloride 0.9 % 250 mL IVPB  1,250 mg Intravenous Q48H Narda Bonds, [facility]       Current Outpatient Prescriptions  Medication Sig Dispense Refill  . abacavir (ZIAGEN) 300 MG tablet Take 2 tablets (600 mg total) by mouth daily.  60 tablet  6  . acyclovir (ZOVIRAX) 800 MG tablet Take 1 tablet (800 mg total) by mouth daily.  30 tablet  3  . aspirin EC 81 MG tablet Take 1 tablet (81 mg total) by mouth daily.  30 tablet  11  . Blood Glucose Monitoring Suppl (ACCU-CHEK AVIVA PLUS) W/DEVICE KIT USE AS DIRECTED  1 kit  0  . cholecalciferol (VITAMIN D) 1000 UNITS tablet Take 1,000 Units by mouth daily.      . cloNIDine (CATAPRES) 0.2 MG tablet Take 1 tablet (0.2 mg total) by mouth 2 (two) times daily.  60 tablet  3  . Darunavir Ethanolate (PREZISTA) 800 MG tablet Take 1 tablet (800 mg total) by mouth daily with breakfast.  30 tablet  6  . furosemide (LASIX) 80 MG tablet Take 2 tablets (160 mg total) by mouth 2 (two) times daily.  120 tablet  0  . gentamicin (GARAMYCIN) 0.3 %  ophthalmic ointment Place 1 application into both eyes 4 (four) times daily.      [name] glucose blood (ACCU-CHEK AVIVA) test strip Use as instructed  100 each  12  . hydrALAZINE (APRESOLINE) 50 MG tablet Take 50 mg by mouth 3 (three) times daily.      . insulin aspart (NOVOLOG) 100 UNIT/ML injection Inject into the skin 3 (three) times daily before meals. Home Sliding scale      . insulin glargine (LANTUS) 100 UNIT/ML injection Inject 30 Units into the skin at bedtime.      [name] labetalol (NORMODYNE) 100 MG tablet Take 1 tablet (100 mg total) by mouth 2 (two) times daily.  60 tablet  3  . lamiVUDine (EPIVIR) 150 MG tablet Take 1 tablet (150 mg total) by mouth daily.  30 tablet  6  . levETIRAcetam (KEPPRA) 500 MG tablet Take 3 tablets (1,500 mg total) by mouth every 12  (twelve) hours.  180 tablet  3  . lisinopril (PRINIVIL,ZESTRIL) 20 MG tablet Take 1 tablet (20 mg total) by mouth daily.  30 tablet  3  . LORazepam (ATIVAN) 1 MG tablet Take 1 mg by mouth daily. Take one tablet for seizure prevention.      . methocarbamol (ROBAXIN) 500 MG tablet Take 500 mg by mouth every 6 (six) hours as needed for muscle spasms.      . Oxycodone HCl 10 MG TABS Take 1 tablet (10 mg total) by mouth every 6 (six) hours as needed (severe pain).  12 tablet  0  . pantoprazole (PROTONIX) 40 MG tablet Take 1 tablet (40 mg total) by mouth daily.  30 tablet  11  . phenytoin (DILANTIN) 50 MG tablet Chew 4 tablets (200 mg total) by mouth 2 (two) times daily.  180 tablet  3  . predniSONE (DELTASONE) 10 MG tablet Take 20 mg daily with breakfast for three days (Friday, 07/12/2013 to 07/14/2013) and then  Take 10 mg daily with breakfast for three days (Monday 07/15/2013 to 07/17/2013) and stop  9 tablet  0  . ritonavir (NORVIR) 100 MG TABS tablet Take 100 mg by mouth daily with breakfast.        Allergies: Allergies as of 07/18/2013 - Review Complete 07/18/2013  Allergen Reaction Noted  . Ceftriaxone  06/01/2013  . Norvasc [amlodipine besylate]  03/30/2013  . Morphine and related Hives, Itching, and Rash 02/15/2013   Past Medical History  Diagnosis Date  . Seizures   . Stroke   . Meningitis   . HIV (human immunodeficiency virus infection)   . Hypertension   . Gout   . Muscle spasms of head and/or neck   . CKD (chronic kidney disease)   . CHF (congestive heart failure)     [name] 06/18/2013  . HCV (hepatitis C virus)     chronic/notes 06/18/2013  . Type II diabetes mellitus     [name] 06/18/2013  . AIHA (autoimmune hemolytic anemia)     [name] 06/18/2013  . Hypertensive encephalopathy ~ 05/2013    hospitalaized/notes 06/18/2013  . Daily headache     "for the last 6 years/notes 06/18/2013  . Exertional shortness of breath     [name] 06/18/2013  . Anxiety     [name] 06/18/2013  .  Nephrotic syndrome   . History of syphilis   . High cholesterol    Past Surgical History  Procedure Laterality Date  . Hip pinning Right    Family History  Problem Relation Age of Onset  .  Cancer - Colon Mother   . Cancer Father   . Diabetes    . Hypertension Father    History   Social History  . Marital Status: Single    Spouse Name: N/A    Number of Children: 4  . Years of Education: 11th   Occupational History  . Not on file.   Social History Main Topics  . Smoking status: Former Smoker    Types: Cigarettes    Quit date: 06/19/2010  . Smokeless tobacco: Never Used  . Alcohol Use: No  . Drug Use: No     Comment: past history of alcohol, cocaine and IV drug use  . Sexual Activity: Not Currently    Partners: Male     Comment: given condoms   Other Topics Concern  . Not on file   Social History Narrative   Patient lives at home with sister.    Patient is unemployed.    Patient is single.    Patient has 2 alive and 2 deceased.    Patient has 11th grade education.           Review of Systems: Review of Systems  Constitutional: Positive for chills and malaise/fatigue. Negative for fever.  HENT:       Sneezing  Respiratory: Negative for cough and shortness of breath.   Cardiovascular: Negative for chest pain.  Gastrointestinal: Negative for nausea, vomiting, abdominal pain and diarrhea.  Genitourinary: Positive for frequency.  Musculoskeletal: Positive for falls. Negative for neck pain.  Neurological: Positive for seizures and weakness.  Psychiatric/Behavioral: Negative for substance abuse.   Physical Exam: Blood pressure 110/51, pulse 110, temperature 100 F (37.8 C), temperature source Oral, resp. rate 22, weight 182 lb (82.555 kg), SpO2 98.00%. Physical Exam  Constitutional: No distress.  HENT:  Head: Normocephalic and atraumatic.  Right Ear: External ear normal.  Left Ear: External ear normal.  Nose: Nose normal.  Mouth/Throat: Oropharynx is  clear and moist. No oropharyngeal exudate.  Dry mucous membranes  Eyes: EOM are normal. Pupils are equal, round, and reactive to light. Right eye exhibits no discharge. Left eye exhibits no discharge. Scleral icterus is present.  Neck: Normal range of motion. Neck supple.  Cardiovascular: Normal rate, regular rhythm and normal heart sounds.   Pulmonary/Chest: Effort normal and breath sounds normal. No respiratory distress. She has no wheezes.  Decreased breath sounds R>L  Abdominal: Soft. Bowel sounds are normal. She exhibits no distension. There is tenderness (RUQ ). There is no rebound and no guarding.  Genitourinary:  Left CVA tenderness  Musculoskeletal: Normal range of motion. She exhibits edema (+1-2 pitting LE edema).  Neurological: She is alert. No cranial nerve deficit.  Orientated x 2, unable to answer questions    Skin: Skin is warm and dry. She is not diaphoretic.     Lab results: Basic Metabolic Panel:  Recent Labs  07/18/13 2238 07/18/13 2312  NA 132* 130*  K 4.5 4.3  CL 93* 98  CO2 17*  --   GLUCOSE 500* 524*  BUN 123* 124*  CREATININE 4.23* 4.60*  CALCIUM 8.0*  --    Liver Function Tests:  Recent Labs  07/18/13 2238  AST 30  ALT 38*  ALKPHOS 297*  BILITOT 10.8*  PROT 6.2  ALBUMIN 1.8*   CBC:  Recent Labs  07/18/13 2238 07/18/13 2312  WBC 20.7*  --   NEUTROABS 17.8*  --   HGB 9.3* 10.5*  HCT 26.8* 31.0*  MCV 98.2  --  PLT 159  --    CBG:  Recent Labs  07/19/13 0048  GLUCAP 506*    Imaging results:  Dg Chest Port 1 View  07/19/2013   CLINICAL DATA:  Fever.  Altered mental status.  EXAM: PORTABLE CHEST - 1 VIEW  COMPARISON:  07/05/2013  FINDINGS: Increasing opacity at the right lung base medially concerning for pneumonia. Left lung is clear. Heart is normal size. No effusions or acute bony abnormality.  IMPRESSION: Right medial basilar airspace opacity concerning for pneumonia.   Electronically Signed   By: Rolm Baptise M.D.   On:  07/19/2013 00:16    Other results: EKG: sinus tachycardia, mild ST depression in II, III, aVF (seen on prior EKG, perhaps mildly more prominent today), J-point elevation in V1-V3 (seen on prior)  Assessment & Plan by Problem: The patient is a 53 yo woman, history of DM, CKD due to nephrotic syndrome, recently discharged after a hospitalization for volume overload, presenting with DKA most likely due to infectious source.  # Diabetic Ketoacidosis - The patient presents with AG = 22, and CBG = 500.  Contributing factors likely include insulin non-compliance, recent prednisone use, and infection (HCAP vs UTI) due to sepsis on admission (SIRS + 4).   -admit to stepdown -s/p 2 L NS boluses -insulin drip, with IVF @ 150 cc/hr -BMETs q4hrs, CBG q1hr -antibiotics per below  # Leukocytosis - The patient presents with WBC = 20.7, with concern for possible HCAP (subtle change in RLL opacity from prior imaging) vs UTI (left flank tenderness on exam). -follow-up blood cultures, UA, urine strep, urine legionella -continue vanc/aztreonam started by ED -consider repeating a 2-view CXR when patient's clinical status improves, to better define RLL opacity  # Acute Kidney Injury on CKD - History of nephrotic syndrome, recently admitted for volume overload.  The patient now likely has prerenal azotemia, secondary to hyperglycemia.  Renal biopsy was performed 2/9, but the result of this biopsy is not in our system. 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  During her last admission, Hb dropped, requiring transfusion.  Hb currently only down to 9.3 (from 10.8), though she likely has some hemoconcentration which may be masking a lower Hb  level.  Furthermore, total bilirubin was elevated on admission to 10.8, suggesting possible active hemolysis. -check LDH, haptoglobin, fractionated bilirubin -repeat CBC in AM -type and screen, given need for transfusion last admission and question of active hemolysis -consider restarting prednisone if labs reveal evidence of hemolysis  # Hypotension - The patient presents with hypotension, likely due to volume depletion vs septic state. The patient has a history of HTN, on home clonidine, lasix, labetalol, lisinopril, hydralazine. -hold all antihypertensives, given hypotension -IVF per above  # Acute encephalopathy - the patient is presently confused at the time of our exam, likely due to DKA and septic state. Of note, the patient experienced acute encephalopathy during her last admission associated with hyperglycemia, with CT and MRI at that time with no acute process. -treat DKA per above  # HIV - last CD4 = 490 (05/07/13) -continue abacavir, prezista, lamivudine, norvir  # Seizure disorder - family member notes 2 seizures 3-4 days prior to admission.  Dilantin level low on admission (  similar to prior admission). -continue home keppra, dilantin  Dispo: Disposition is deferred at this time, awaiting improvement of current medical problems. Anticipated discharge in approximately 2-4 day(s).   The patient does have a current PCP Clinton Gallant, MD) and does need an [facility] hospital follow-up appointment after discharge.  Signed: Hester Mates, MD 07/19/2013, 2:10 AM

## 2013-07-20 DIAGNOSIS — N183 Chronic kidney disease, stage 3 unspecified: Secondary | ICD-10-CM

## 2013-07-20 DIAGNOSIS — Y95 Nosocomial condition: Secondary | ICD-10-CM

## 2013-07-20 DIAGNOSIS — I509 Heart failure, unspecified: Secondary | ICD-10-CM

## 2013-07-20 DIAGNOSIS — E119 Type 2 diabetes mellitus without complications: Secondary | ICD-10-CM

## 2013-07-20 DIAGNOSIS — R197 Diarrhea, unspecified: Secondary | ICD-10-CM

## 2013-07-20 DIAGNOSIS — R7881 Bacteremia: Secondary | ICD-10-CM

## 2013-07-20 DIAGNOSIS — J189 Pneumonia, unspecified organism: Secondary | ICD-10-CM | POA: Diagnosis present

## 2013-07-20 DIAGNOSIS — A498 Other bacterial infections of unspecified site: Secondary | ICD-10-CM

## 2013-07-20 LAB — CBC WITH DIFFERENTIAL/PLATELET
Basophils Absolute: 0 10*3/uL (ref 0.0–0.1)
Basophils Relative: 0 % (ref 0–1)
EOS ABS: 0 10*3/uL (ref 0.0–0.7)
Eosinophils Relative: 0 % (ref 0–5)
HEMATOCRIT: 21 % — AB (ref 36.0–46.0)
Hemoglobin: 7.1 g/dL — ABNORMAL LOW (ref 12.0–15.0)
Lymphocytes Relative: 15 % (ref 12–46)
Lymphs Abs: 1.5 10*3/uL (ref 0.7–4.0)
MCH: 33.5 pg (ref 26.0–34.0)
MCHC: 33.8 g/dL (ref 30.0–36.0)
MCV: 99.1 fL (ref 78.0–100.0)
Monocytes Absolute: 1.2 10*3/uL — ABNORMAL HIGH (ref 0.1–1.0)
Monocytes Relative: 12 % (ref 3–12)
NEUTROS ABS: 7.1 10*3/uL (ref 1.7–7.7)
Neutrophils Relative %: 73 % (ref 43–77)
Platelets: 127 10*3/uL — ABNORMAL LOW (ref 150–400)
RBC: 2.12 MIL/uL — ABNORMAL LOW (ref 3.87–5.11)
RDW: 17.9 % — AB (ref 11.5–15.5)
WBC: 9.8 10*3/uL (ref 4.0–10.5)

## 2013-07-20 LAB — RENAL FUNCTION PANEL
ALBUMIN: 1.3 g/dL — AB (ref 3.5–5.2)
BUN: 94 mg/dL — ABNORMAL HIGH (ref 6–23)
CALCIUM: 7.4 mg/dL — AB (ref 8.4–10.5)
CO2: 23 mEq/L (ref 19–32)
Chloride: 94 mEq/L — ABNORMAL LOW (ref 96–112)
Creatinine, Ser: 3.05 mg/dL — ABNORMAL HIGH (ref 0.50–1.10)
GFR, EST AFRICAN AMERICAN: 19 mL/min — AB (ref >90)
GFR, EST NON AFRICAN AMERICAN: 16 mL/min — AB (ref >90)
GLUCOSE: 409 mg/dL — AB (ref 70–99)
POTASSIUM: 4.1 meq/L (ref 3.7–5.3)
Phosphorus: 3 mg/dL (ref 2.3–4.6)
SODIUM: 132 meq/L — AB (ref 137–147)

## 2013-07-20 LAB — GLUCOSE, CAPILLARY
GLUCOSE-CAPILLARY: 254 mg/dL — AB (ref 70–99)
Glucose-Capillary: 355 mg/dL — ABNORMAL HIGH (ref 70–99)
Glucose-Capillary: 269 mg/dL — ABNORMAL HIGH (ref 70–99)
Glucose-Capillary: 178 mg/dL — ABNORMAL HIGH (ref 70–99)

## 2013-07-20 LAB — CBC
HCT: 23.6 % — ABNORMAL LOW (ref 36.0–46.0)
HEMATOCRIT: 18.6 % — AB (ref 36.0–46.0)
HEMOGLOBIN: 8.2 g/dL — AB (ref 12.0–15.0)
Hemoglobin: 6.3 g/dL — CL (ref 12.0–15.0)
MCH: 33.5 pg (ref 26.0–34.0)
MCH: 33.2 pg (ref 26.0–34.0)
MCHC: 33.9 g/dL (ref 30.0–36.0)
MCHC: 34.7 g/dL (ref 30.0–36.0)
MCV: 98.9 fL (ref 78.0–100.0)
MCV: 95.5 fL (ref 78.0–100.0)
PLATELETS: 129 10*3/uL — AB (ref 150–400)
Platelets: 102 10*3/uL — ABNORMAL LOW (ref 150–400)
RBC: 1.88 MIL/uL — AB (ref 3.87–5.11)
RBC: 2.47 MIL/uL — ABNORMAL LOW (ref 3.87–5.11)
RDW: 17.8 % — ABNORMAL HIGH (ref 11.5–15.5)
RDW: 18.8 % — ABNORMAL HIGH (ref 11.5–15.5)
WBC: 8.6 10*3/uL (ref 4.0–10.5)
WBC: 8.2 10*3/uL (ref 4.0–10.5)

## 2013-07-20 LAB — HEPATIC FUNCTION PANEL
ALT: 26 U/L (ref 0–35)
AST: 38 U/L — ABNORMAL HIGH (ref 0–37)
Albumin: 1.3 g/dL — ABNORMAL LOW (ref 3.5–5.2)
Alkaline Phosphatase: 234 U/L — ABNORMAL HIGH (ref 39–117)
BILIRUBIN DIRECT: 7.8 mg/dL — AB (ref 0.0–0.3)
BILIRUBIN INDIRECT: 1.6 mg/dL — AB (ref 0.3–0.9)
TOTAL PROTEIN: 5.2 g/dL — AB (ref 6.0–8.3)
Total Bilirubin: 9.4 mg/dL — ABNORMAL HIGH (ref 0.3–1.2)

## 2013-07-20 LAB — PREPARE RBC (CROSSMATCH)

## 2013-07-20 LAB — LEGIONELLA ANTIGEN, URINE: LEGIONELLA ANTIGEN, URINE: NEGATIVE

## 2013-07-20 LAB — CLOSTRIDIUM DIFFICILE BY PCR: Toxigenic C. Difficile by PCR: NEGATIVE

## 2013-07-20 MED ORDER — LEVOFLOXACIN IN D5W 750 MG/150ML IV SOLN
750.0000 mg | Freq: Once | INTRAVENOUS | Status: AC
  Administered 2013-07-20: 750 mg via INTRAVENOUS
  Filled 2013-07-20: qty 150

## 2013-07-20 MED ORDER — LEVOFLOXACIN IN D5W 500 MG/100ML IV SOLN
500.0000 mg | INTRAVENOUS | Status: DC
  Filled 2013-07-20: qty 100

## 2013-07-20 MED ORDER — HYDRALAZINE HCL 20 MG/ML IJ SOLN
5.0000 mg | Freq: Once | INTRAMUSCULAR | Status: AC
  Administered 2013-07-20: 5 mg via INTRAVENOUS
  Filled 2013-07-20: qty 1

## 2013-07-20 MED ORDER — WHITE PETROLATUM GEL
Status: AC
  Administered 2013-07-20: 15:00:00
  Filled 2013-07-20: qty 5

## 2013-07-20 MED ORDER — INSULIN GLARGINE 100 UNIT/ML ~~LOC~~ SOLN
60.0000 [IU] | Freq: Every day | SUBCUTANEOUS | Status: DC
  Administered 2013-07-21: 60 [IU] via SUBCUTANEOUS
  Filled 2013-07-20 (×3): qty 0.6

## 2013-07-20 MED ORDER — VANCOMYCIN HCL IN DEXTROSE 1-5 GM/200ML-% IV SOLN
1000.0000 mg | INTRAVENOUS | Status: DC
  Administered 2013-07-20: 1000 mg via INTRAVENOUS
  Filled 2013-07-20: qty 200

## 2013-07-20 MED ORDER — ACETAMINOPHEN 500 MG PO TABS
500.0000 mg | ORAL_TABLET | Freq: Four times a day (QID) | ORAL | Status: DC | PRN
  Administered 2013-07-20 – 2013-07-27 (×5): 500 mg via ORAL
  Filled 2013-07-20 (×5): qty 1

## 2013-07-20 MED ORDER — CLONIDINE HCL 0.2 MG PO TABS
0.2000 mg | ORAL_TABLET | Freq: Two times a day (BID) | ORAL | Status: DC
  Administered 2013-07-20 – 2013-07-21 (×3): 0.2 mg via ORAL
  Filled 2013-07-20 (×6): qty 1

## 2013-07-20 MED ORDER — INSULIN ASPART 100 UNIT/ML ~~LOC~~ SOLN
8.0000 [IU] | Freq: Three times a day (TID) | SUBCUTANEOUS | Status: DC
Start: 2013-07-20 — End: 2013-07-22
  Administered 2013-07-20 – 2013-07-22 (×4): 8 [IU] via SUBCUTANEOUS

## 2013-07-20 NOTE — Progress Notes (Addendum)
ANTIBIOTIC CONSULT NOTE - FOLLOW UP  Pharmacy Consult for Vancomycin/Aztreonam  Indication: r/o HCAP  Allergies  Allergen Reactions  . 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 Norvasc [Amlodipine Besylate]     Itching, rash , hives .   [name]. Morphine And Related Hives, Itching and Rash    Patient Measurements: Height: 5' 5.5" (166.4 cm) Weight: 185 lb 10 oz (84.2 kg) IBW/kg (Calculated) : 58.15  Vital Signs: Temp: 100.4 F (38 C) (02/21 0400) Temp src: Oral (02/21 0400) BP: 156/76 mmHg (02/21 0400) Pulse Rate: 119 (02/21 0400) Labs:  Recent Labs  07/18/13 2238 07/18/13 2312 07/19/13 0525  07/19/13 1300 07/19/13 1700 07/20/13 0612  WBC 20.7*  --  17.6*  --   --   --  9.8  HGB 9.3* 10.5* 9.7*  --   --   --  7.1*  PLT 159  --  157  --   --   --  127*  CREATININE 4.23* 4.60* 4.12*  < > 3.88* 3.63* 3.05*  < > = values in this interval not displayed.  Microbiology: Recent Results (from the past 720 hour(s))  URINE CULTURE     Status: None   Collection Time    07/02/13  5:12 PM      Result Value Ref Range Status   Specimen Description URINE, CLEAN CATCH   Final   Special Requests Immunocompromised   Final   Culture  Setup Time     Final   Value: 07/02/2013 19:28     Performed at [facility]   Colony Count     Final   Value: 40,000 COLONIES/ML     [facility]formed at [facility]   Culture     Final   Value: Multiple bacterial morphotypes present, none predominant. Suggest appropriate recollection if clinically indicated.     Performed at [facility]   Report Status 07/03/2013 FINAL   Final  URINE CULTURE     Status: None   Collection Time    07/05/13 11:52 PM      Result Value Ref Range Status   Specimen Description URINE, RANDOM   Final   Special Requests Immunocompromised   Final   Culture  Setup Time     Final   Value: 07/06/2013 06:13     Performed at [facility]   Colony Count      Final   Value: 45,000 COLONIES/ML     Performed at [facility]   Culture     Final   Value: Multiple bacterial morphotypes present, none predominant. Suggest appropriate recollection if clinically indicated.     Performed at [facility]   Report Status 07/07/2013 FINAL   Final  CULTURE, BLOOD (ROUTINE X 2)     Status: None   Collection Time    07/19/13 12:30 AM      Result Value Ref Range Status   Specimen Description BLOOD RIGHT HAND   Final   Special Requests BOTTLES DRAWN AEROBIC AND ANAEROBIC [facility]5CC EACH   Final   Culture  Setup Time     Final   Value: 07/19/2013 08:47     Performed at [facility]   Culture     Final   Value: GRAM NEGATIVE RODS     Note: Gram Stain Report Called to,Read Back By and Verified With: Sherolyn BubaGABE SANTANELLA 07/19/13 AT 8:22PM THOMI     Performed at [facility]   Report Status PENDING   Incomplete  MRSA PCR SCREENING     Status: Abnormal   Collection Time    07/19/13  3:52 AM      Result Value Ref Range Status   MRSA by PCR POSITIVE (*) NEGATIVE Final   Comment:            The GeneXpert MRSA Assay (FDA     approved for NASAL specimens     only), is one component of a     comprehensive MRSA colonization     surveillance program. It is not     intended to diagnose MRSA     infection nor to guide or     monitor treatment for     MRSA infections.     RESULT CALLED TO, READ BACK BY AND VERIFIED WITH:     C PORTER,RN 07/19/13 0827 BY K SCHULTZ    Medical History: Past Medical History  Diagnosis Date  . Seizures   . Stroke   . Meningitis   . HIV (human immunodeficiency virus infection)   . Hypertension   . Gout   . Muscle spasms of head and/or neck   . CKD (chronic kidney disease)   . CHF (congestive heart failure)     [name] 06/18/2013  . HCV (hepatitis C virus)     chronic/notes 06/18/2013  . Type II diabetes mellitus     [name] 06/18/2013  . AIHA (autoimmune hemolytic anemia)     [name] 06/18/2013  .  Hypertensive encephalopathy ~ 05/2013    hospitalaized/notes 06/18/2013  . Daily headache     "for the last 6 years/notes 06/18/2013  . Exertional shortness of breath     [name] 06/18/2013  . Anxiety     [name] 06/18/2013  . Nephrotic syndrome   . History of syphilis   . High cholesterol    Assessment: 53 y/o F with recent admission (for nephrotic syndrome) back with possible PNA on CXR. Pharmacy has been consulted to dose vancomycin and aztreonam.  Patient's WBC are trending down (20.7 >>17.6 >>9.8). Tmax 103.2  Patient's acute on chronic renal failure is improving (Scr 4.60 on admit >> 3.05 today). CrCl ~23 mL/min.  1/2 blood cultures growing gram negative rods. Will keep aztreonam at current dose; will adjust vancomycin dose due to improved renal function.  (Due to fever and pending cultures, will tentatively keep vancomycin for now. If other blood cultures result in gram negative rods, consider discontinuing vancomycin.)  Goal of Therapy:  Vancomycin trough level 15-20 mcg/ml  Plan:  -Vancomycin 1000 mg IV q24h -Aztreonam 1g IV q8h -Trend WBC, temp, renal function, cultures  -Drug levels as indicated   Eldred Lievanos C. Sylvio Weatherall, PharmD Clinical Pharmacist-Resident Pager: [phone] Pharmacy: [phone] 07/20/2013 7:37 AM  Addendum: Blood cultures reveal E. Coli. Vancomycin and aztreonam have been d/c'd. Pharmacy consulted to start Levaquin.  CrCl ~23 mL/min  Plan: -Will start levaquin 750 mg IV x 1 dose; then 500 mg IV q 48 hours -f/u cultures, clinical progression, renal function  Haifa Hatton C. Bruce Mayers, PharmD Clinical Pharmacist-Resident Pager: [phone] Pharmacy: [phone] 07/20/2013 11:14 AM

## 2013-07-20 NOTE — Progress Notes (Signed)
Subjective: She continues to appear lethargic but she is arousable and able to answer a few questions. Blood culture 1/2 + E coli. She continues to be febrile.  Objective: Vital signs in last 24 hours: Filed Vitals:   07/20/13 0530 07/20/13 0800 07/20/13 1129 07/20/13 1209  BP:  163/73 176/74 165/72  Pulse:  111 110 111  Temp:  101.4 F (38.6 C) 101 F (38.3 C) 100.7 F (38.2 C)  TempSrc:  Axillary Oral Oral  Resp:  32 29 27  Height:      Weight: 185 lb 10 oz (84.2 kg)     SpO2:  96% 98% 99%   Weight change: 4 lb 15.2 oz (2.245 kg)  Intake/Output Summary (Last 24 hours) at 07/20/13 1246 Last data filed at 07/20/13 1000  Gross per 24 hour  Intake 2818.85 ml  Output    825 ml  Net 1993.85 ml    General: Vital signs reviewed. Appears lethargic, but arousable. Lungs: Clear Bilateral Heart: Regular; no extra sounds or murmurs  Abdomen: Bowel sounds present, soft, nontender; no hepatosplenomegaly  Extremities: Trace bilateral ankle edema  Neurologic: unable to keep her eyes open for long time, but she is able to answer a few questions. She is oriented.no focal neurological deficits.  Lab Results: Basic Metabolic Panel:  Recent Labs Lab 07/19/13 1700 07/20/13 0612  NA 138 132*  K 3.8 4.1  CL 98 94*  CO2 22 23  GLUCOSE 118* 409*  BUN 108* 94*  CREATININE 3.63* 3.05*  CALCIUM 8.0* 7.4*  PHOS 4.9* 3.0   Liver Function Tests:  Recent Labs Lab 07/18/13 2238 07/19/13 0525 07/19/13 1700 07/20/13 0612  AST 30  --   --  38*  ALT 38*  --   --  26  ALKPHOS 297*  --   --  234*  BILITOT 10.8* 11.7*  --  9.4*  PROT 6.2  --   --  5.2*  ALBUMIN 1.8*  --  1.6* 1.3*  1.3*   No results found for this basename: LIPASE, AMYLASE,  in the last 168 hours No results found for this basename: AMMONIA,  in the last 168 hours CBC:  Recent Labs Lab 07/18/13 2238  07/19/13 0525 07/20/13 0612  WBC 20.7*  --  17.6* 9.8  NEUTROABS 17.8*  --   --  7.1  HGB 9.3*  < > 9.7* 7.1*   HCT 26.8*  < > 28.2* 21.0*  MCV 98.2  --  98.3 99.1  PLT 159  --  157 127*  < > = values in this interval not displayed. Cardiac Enzymes:  Recent Labs Lab 07/19/13 0525  TROPONINI <0.30   CBG:  Recent Labs Lab 07/19/13 1503 07/19/13 1613 07/19/13 1710 07/19/13 1809 07/19/13 2137 07/20/13 0855  GLUCAP 106* 113* 120* 113* 139* 355*   Anemia Panel:  Recent Labs Lab 07/19/13 0525  RETICCTPCT 1.7   Urine Drug Screen: Drugs of Abuse    Urinalysis:  Recent Labs Lab 07/19/13 0400  COLORURINE ORANGE*  LABSPEC 1.021  PHURINE 5.0  GLUCOSEU 250*  HGBUR TRACE*  BILIRUBINUR LARGE*  KETONESUR NEGATIVE  PROTEINUR >300*  UROBILINOGEN 2.0*  NITRITE NEGATIVE  LEUKOCYTESUR SMALL*   Micro Results: Recent Results (from the past 240 hour(s))  CULTURE, BLOOD (ROUTINE X 2)     Status: None   Collection Time    07/19/13 12:30 AM      Result Value Ref Range Status   Specimen Description BLOOD RIGHT HAND   Final  Special Requests BOTTLES DRAWN AEROBIC AND ANAEROBIC [facility] EACH   Final   Culture  Setup Time     Final   Value: 07/19/2013 08:47     Performed at [facility]   Culture     Final   Value: ESCHERICHIA COLI     Note: Gram Stain Report Called to,Read Back By and Verified With: Sherolyn Buba 07/19/13 AT 8:22PM THOMI     Performed at [facility]   Report Status PENDING   Incomplete  MRSA PCR SCREENING     Status: Abnormal   Collection Time    07/19/13  3:52 AM      Result Value Ref Range Status   MRSA by PCR POSITIVE (*) NEGATIVE Final   Comment:            The GeneXpert MRSA Assay (FDA     approved for NASAL specimens     only), is one component of a     comprehensive MRSA colonization     surveillance program. It is not     intended to diagnose MRSA     infection nor to guide or     monitor treatment for     MRSA infections.     RESULT CALLED TO, READ BACK BY AND VERIFIED WITH:     C PORTER,RN 07/19/13 0827 BY K SCHULTZ    Studies/Results: US Abdomen Complete  07/19/2013   CLINICAL DATA:  Right upper quadrant abdominal tenderness, renal failure.  EXAM: ULTRASOUND ABDOMEN COMPLETE  COMPARISON:  None.  FINDINGS: Gallbladder:  Sludge is noted within the gallbladder lumen. No definite stones or gallbladder wall thickening is noted. No sonographic Murphy's sign is noted.  Common bile duct:  Diameter: Measures 2.4 mm which is within normal limits.  Liver:  Contours are slightly nodular suggesting possible hepatic cirrhosis. No focal abnormality is noted.  IVC:  No abnormality visualized.  Pancreas:  Visualized portion unremarkable.  Spleen:  Size and appearance within normal limits.  Right Kidney:  Length: 12.2 cm. No mass or hydronephrosis is noted. Increased echogenicity of renal parenchyma is noted suggesting medical renal disease.  Left Kidney:  Length: 12.7 cm. Kidney is not well visualized due to overlying bowel gas.  Abdominal aorta:  No aneurysm visualized.  Other findings:  None.  IMPRESSION: Sludge is noted within the gallbladder lumen.  Hepatic contours are slightly nodular suggesting the possibility of hepatic cirrhosis. Clinical correlation is recommended.  Increased echogenicity of renal parenchyma is noted suggesting medical renal disease.   Electronically Signed   By: Roque Lias M.D.   On: 07/19/2013 16:00   Dg Chest Port 1 View  07/19/2013   CLINICAL DATA:  Fever.  Altered mental status.  EXAM: PORTABLE CHEST - 1 VIEW  COMPARISON:  07/05/2013  FINDINGS: Increasing opacity at the right lung base medially concerning for pneumonia. Left lung is clear. Heart is normal size. No effusions or acute bony abnormality.  IMPRESSION: Right medial basilar airspace opacity concerning for pneumonia.   Electronically Signed   By: Charlett Nose M.D.   On: 07/19/2013 00:16   Medications: I have reviewed the patient's current medications. Scheduled Meds: . abacavir  600 mg Oral Daily  . acetaminophen  650 mg Oral Once  .  antiseptic oral rinse  15 mL Mouth Rinse BID  . aspirin EC  81 mg Oral Daily  . Chlorhexidine Gluconate Cloth  6 each Topical Q0600  . Darunavir Ethanolate  800 mg Oral Q breakfast  .  heparin  5,000 Units Subcutaneous 3 times per day  . insulin aspart  0-20 Units Subcutaneous TID WC  . insulin glargine  50 Units Subcutaneous Daily  . lamiVUDine  100 mg Oral Daily  . levETIRAcetam  1,500 mg Oral Q12H  . [START ON 07/22/2013] levofloxacin (LEVAQUIN) IV  500 mg Intravenous Q48H  . levofloxacin (LEVAQUIN) IV  750 mg Intravenous Once  . mupirocin ointment  1 application Nasal BID  . pantoprazole  40 mg Oral Daily  . phenytoin  200 mg Oral BID  . ritonavir  100 mg Oral Q breakfast  . sodium chloride  3 mL Intravenous Q12H   Continuous Infusions: .  sodium bicarbonate 150 mEq in sterile water 1000 mL infusion 150 mEq (07/20/13 0521)   PRN Meds:.acetaminophen, ondansetron (ZOFRAN) IV, ondansetron Assessment/Plan: Michelle [name] is a 53 year old woman with past medical history of dCHF (EF normal, with Grade-2 diastolic dysfunction), HIV (CD4 490 and VL<20 on 05/07/13), chronic HCV, seizure disorder, hx of stroke, HTN, insulin dependent Type II DM, CKD, gout, and AIHA who presents with altered mental status.   # Increased Anion Gap Metabolic Acidosis: anion gap 22>>15. Secondary to uremia, associated with acute on chronic renal insufficiency.  Plan - BUN improved from 116 >>94. - appreciate nephrology - monitor renal function daily   # Diarrhea: Patient was noted to have increased nonbloody diarrhea over night. She does have multiple episodes since night time. She has significant risk factors for c.diff including prior hospitalization and antibiotic use. Will check C. Difficile.  # Sepsis E coli Bacteremia versus Pneumonia: Blood culture 1/2 on 07/19/2013 revealed E coli. Possible source of infection is GI versus urinary tract. However, Urinalysis 2/20 negative for nitrites. She continues to be  febrile, tachycardic and tachypnenic but BP is stable. Abdomen ultrasound scan, remarkable for sludge, but without features of cholecystitis. Chest x-Pulliam to 20/15 with the right medial basilar airspace opacity, concerning for pneumonia. Plan  -  narrow abx to Levaquin (renal dosed) due to allergy - will change to a penicillin-related antibiotic if sensitive - repeat blood cultures  - repeat UA - keep her SDU  # Acute Kidney Injury on CKD - Improving. Biopsy showed collapsing FSGS. HD catheter inserted 07/19/2013.  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 LDH elevated, bilirubin elevated (however direct and not indirect), hemoglobin stable from discharge. Low sucipicion for active hemolysis  - monitor CBC - hold off steroids for now in the setting of active infection  # Diabetes: elevated CBGs - increase to Lantus 60 units  - add meal coverage  - Carb mod diet   # Hypertension : On home clonidine, lasix, labetalol, lisinopril, hydralazine. Initially BP was low but currently elevated.  -restart clonidine 0.2 mg bid  # HIV - last CD4 = 490 (05/07/13)  -continue abacavir, prezista, lamivudine (renal adjust to 100mg ), norvir   # Seizure disorder - family member notes 2 seizures 3-4 days prior to admission. Dilantin level low on admission (similar to prior admission).  -continue home keppra, dilantin   Dispo: Disposition is deferred at this time, awaiting improvement of current medical problems.  Anticipated discharge in approximately 3-5 day(s).   The patient does have a current PCP Burtis Junes, Arna Medici, MD), therefore will be requiring OPC follow-up after discharge.   The patient does not have transportation limitations that hinder transportation to clinic appointments.  .Services Needed at  time of discharge: Y = Yes, Blank = No PT:   OT:   RN:   Equipment:   Other:     LOS: 2 days    Dow AdolphKazibwe, Michelle [name] PGY 2 - Internal Medicine Teaching Service Pager: [phone] 07/20/2013, 12:46 PM

## 2013-07-20 NOTE — Progress Notes (Signed)
CRITICAL VALUE ALERT  Critical value received:  Hemoglobin 6.3  Date of notification:  07/20/13  Time of notification: 1445  Critical value read back: yes  Nurse who received alert:  Arman Bogus RN  MD notified (1st page):  Dr Zada Girt  Time of first page:  [phone]

## 2013-07-20 NOTE — Progress Notes (Signed)
  Date: 07/20/2013  Patient name: Michelle [name]  Medical record number: [id number]  Date of birth: 08-22-1960   This patient has been seen and the plan of care was discussed with the house staff. Please see their note for complete details. I concur with their findings with the following additions/corrections:  She is now growing E coli in blood culture, 1/1.  Repeat cultures sent.  Will narrow coverage to E coli. She is still febrile, will narrow coverage to FQ due to allergy and change to penicillin-related antibiotic if sensitive.  Ceftriaxone listed as possible cause of AIHA and a reported dose before her Hgb dropped from 8 to 6.9.  LDH though was minimally elevated and haptoglobin normal.  Positive Coombs can be falsely positive in patients with HIV so unclear but penicillins still possible to use.    For her hepatitis C, the new oral agents are not vaildated with GFR < 30 so would require interferon based therapy with ribavirin and would have poor efficacy and side effects.  Will wait for future studies for use of oral agents in renal disease.    Gardiner Barefoot, MD 07/20/2013, 10:49 AM

## 2013-07-20 NOTE — Progress Notes (Signed)
Patient ID: Michelle [name], female   DOB: 04/18/1961, 53 y.o.   MRN: [id number] S:more awake today, no new complaints but is still  Confused about what happened O:BP 165/72  Pulse 111  Temp(Src) 100.7 F (38.2 C) (Oral)  Resp 27  Ht 5' 5.5" (1.664 m)  Wt 84.2 kg (185 lb 10 oz)  BMI 30.41 kg/m2  SpO2 99%  Intake/Output Summary (Last 24 hours) at 07/20/13 1248 Last data filed at 07/20/13 1000  Gross per 24 hour  Intake 2818.85 ml  Output    825 ml  Net 1993.85 ml   Intake/Output: I/O last 3 completed shifts: In: 3645.8 [P.O.:600; I.V.:2645.8; IV Piggyback:400] Out: 1250 [Urine:1250]  Intake/Output this shift:  Total I/O In: 575 [I.V.:375; IV Piggyback:200] Out: -  Weight change: 2.245 kg (4 lb 15.2 oz) SWF:[id number] AAF in NAD, slowed mentation CVS:no rub Resp:cta KGU:RKYHCW Ext:tr edema   Recent Labs Lab 07/18/13 2238 07/18/13 2312 07/19/13 0525 07/19/13 0900 07/19/13 1300 07/19/13 1700 07/20/13 0612  NA 132* 130* 134* 134* 136* 138 132*  K 4.5 4.3 4.7 3.6* 3.6* 3.8 4.1  CL 93* 98 94* 98 99 98 94*  CO2 17*  --  14* 18* 20 22 23   GLUCOSE 500* 524* 181* 193* 133* 118* 409*  BUN 123* 124* 116* 119* 116* 108* 94*  CREATININE 4.23* 4.60* 4.12* 4.18* 3.88* 3.63* 3.05*  ALBUMIN 1.8*  --   --   --   --  1.6* 1.3*  1.3*  CALCIUM 8.0*  --  8.3* 7.9* 8.0* 8.0* 7.4*  PHOS  --   --   --   --   --  4.9* 3.0  AST 30  --   --   --   --   --  38*  ALT 38*  --   --   --   --   --  26   Liver Function Tests:  Recent Labs Lab 07/18/13 2238 07/19/13 0525 07/19/13 1700 07/20/13 0612  AST 30  --   --  38*  ALT 38*  --   --  26  ALKPHOS 297*  --   --  234*  BILITOT 10.8* 11.7*  --  9.4*  PROT 6.2  --   --  5.2*  ALBUMIN 1.8*  --  1.6* 1.3*  1.3*   No results found for this basename: LIPASE, AMYLASE,  in the last 168 hours No results found for this basename: AMMONIA,  in the last 168 hours CBC:  Recent Labs Lab 07/18/13 2238 07/18/13 2312 07/19/13 0525  07/20/13 0612  WBC 20.7*  --  17.6* 9.8  NEUTROABS 17.8*  --   --  7.1  HGB 9.3* 10.5* 9.7* 7.1*  HCT 26.8* 31.0* 28.2* 21.0*  MCV 98.2  --  98.3 99.1  PLT 159  --  157 127*   Cardiac Enzymes:  Recent Labs Lab 07/19/13 0525  TROPONINI <0.30   CBG:  Recent Labs Lab 07/19/13 1613 07/19/13 1710 07/19/13 1809 07/19/13 2137 07/20/13 0855  GLUCAP 113* 120* 113* 139* 355*    Iron Studies: No results found for this basename: IRON, TIBC, TRANSFERRIN, FERRITIN,  in the last 72 hours Studies/Results: US Abdomen Complete  07/19/2013   CLINICAL DATA:  Right upper quadrant abdominal tenderness, renal failure.  EXAM: ULTRASOUND ABDOMEN COMPLETE  COMPARISON:  None.  FINDINGS: Gallbladder:  Sludge is noted within the gallbladder lumen. No definite stones or gallbladder wall thickening is noted. No sonographic Murphy's sign is noted.  Common bile duct:  Diameter: Measures 2.4 mm which is within normal limits.  Liver:  Contours are slightly nodular suggesting possible hepatic cirrhosis. No focal abnormality is noted.  IVC:  No abnormality visualized.  Pancreas:  Visualized portion unremarkable.  Spleen:  Size and appearance within normal limits.  Right Kidney:  Length: 12.2 cm. No mass or hydronephrosis is noted. Increased echogenicity of renal parenchyma is noted suggesting medical renal disease.  Left Kidney:  Length: 12.7 cm. Kidney is not well visualized due to overlying bowel gas.  Abdominal aorta:  No aneurysm visualized.  Other findings:  None.  IMPRESSION: Sludge is noted within the gallbladder lumen.  Hepatic contours are slightly nodular suggesting the possibility of hepatic cirrhosis. Clinical correlation is recommended.  Increased echogenicity of renal parenchyma is noted suggesting medical renal disease.   Electronically Signed   By: Roque LiasJames  Green M.D.   On: 07/19/2013 16:00   Dg Chest Port 1 View  07/19/2013   CLINICAL DATA:  Fever.  Altered mental status.  EXAM: PORTABLE CHEST - 1 VIEW   COMPARISON:  07/05/2013  FINDINGS: Increasing opacity at the right lung base medially concerning for pneumonia. Left lung is clear. Heart is normal size. No effusions or acute bony abnormality.  IMPRESSION: Right medial basilar airspace opacity concerning for pneumonia.   Electronically Signed   By: Charlett NoseKevin  Dover M.D.   On: 07/19/2013 00:16   . abacavir  600 mg Oral Daily  . acetaminophen  650 mg Oral Once  . antiseptic oral rinse  15 mL Mouth Rinse BID  . aspirin EC  81 mg Oral Daily  . Chlorhexidine Gluconate Cloth  6 each Topical Q0600  . Darunavir Ethanolate  800 mg Oral Q breakfast  . heparin  5,000 Units Subcutaneous 3 times per day  . insulin aspart  0-20 Units Subcutaneous TID WC  . insulin glargine  50 Units Subcutaneous Daily  . lamiVUDine  100 mg Oral Daily  . levETIRAcetam  1,500 mg Oral Q12H  . [START ON 07/22/2013] levofloxacin (LEVAQUIN) IV  500 mg Intravenous Q48H  . levofloxacin (LEVAQUIN) IV  750 mg Intravenous Once  . mupirocin ointment  1 application Nasal BID  . pantoprazole  40 mg Oral Daily  . phenytoin  200 mg Oral BID  . ritonavir  100 mg Oral Q breakfast  . sodium chloride  3 mL Intravenous Q12H    BMET    Component Value Date/Time   NA 132* 07/20/2013 0612   K 4.1 07/20/2013 0612   CL 94* 07/20/2013 0612   CO2 23 07/20/2013 0612   GLUCOSE 409* 07/20/2013 0612   BUN 94* 07/20/2013 0612   CREATININE 3.05* 07/20/2013 0612   CREATININE 2.00* 06/28/2013 1443   CALCIUM 7.4* 07/20/2013 0612   GFRNONAA 16* 07/20/2013 0612   GFRAA 19* 07/20/2013 0612   CBC    Component Value Date/Time   WBC 9.8 07/20/2013 0612   RBC 2.12* 07/20/2013 0612   RBC 2.87* 07/19/2013 0525   HGB 7.1* 07/20/2013 0612   HCT 21.0* 07/20/2013 0612   PLT 127* 07/20/2013 0612   MCV 99.1 07/20/2013 0612   MCH 33.5 07/20/2013 0612   MCHC 33.8 07/20/2013 0612   RDW 17.9* 07/20/2013 0612   LYMPHSABS 1.5 07/20/2013 0612   MONOABS 1.2* 07/20/2013 0612   EOSABS 0.0 07/20/2013 0612   BASOSABS 0.0 07/20/2013  0612     Assessment/Plan:  1. AKI/CKD- in setting of SIRS/hypotension and ACE-I. She unfortunately has a poor prognosis for  renal survival with collapsing FSGS but the current episode is likely ischemic ATN and hopefully reversible. Given her multiple comorbidities, would like to keep off of dialysis for as long as possible. She will also require permanent access placement when stable as well as education.  1. Cont to hold ACE/ARB 2. Scr improving with fluid resuscitation. 2. SIRS- from E coli bacteremia- on broad spectrum abx,  1. Abx change per primary team 2. No evidence of cholecystitis on [place] 3. Metabolic acidosis- due to #1: improved with IVF's 4. Collapsing FSGS- would not add steroids as risks outweigh benefits and hold ACE/ARB given AKI 1. Will need vein mapping, education, and permanent access placed once bacteremia is resolved 5. AIHA- elevated bili, follow H/H 6. Hyperglycemia- improved with insulin 7. AMS- ?post-ictal vs. Metabolic encephalopathy. Will cont to follow 8. HIV- cont with meds 9. HCV- per ID 10. HTN- stable 11.   Uri Turnbough A

## 2013-07-21 DIAGNOSIS — B1920 Unspecified viral hepatitis C without hepatic coma: Secondary | ICD-10-CM

## 2013-07-21 DIAGNOSIS — Z21 Asymptomatic human immunodeficiency virus [HIV] infection status: Secondary | ICD-10-CM

## 2013-07-21 DIAGNOSIS — I6350 Cerebral infarction due to unspecified occlusion or stenosis of unspecified cerebral artery: Secondary | ICD-10-CM

## 2013-07-21 DIAGNOSIS — N049 Nephrotic syndrome with unspecified morphologic changes: Secondary | ICD-10-CM

## 2013-07-21 DIAGNOSIS — J189 Pneumonia, unspecified organism: Secondary | ICD-10-CM

## 2013-07-21 DIAGNOSIS — K7460 Unspecified cirrhosis of liver: Secondary | ICD-10-CM

## 2013-07-21 LAB — CULTURE, BLOOD (ROUTINE X 2)

## 2013-07-21 LAB — TYPE AND SCREEN
ABO/RH(D): O POS
ANTIBODY SCREEN: POSITIVE
DAT, IgG: POSITIVE
Unit division: 0

## 2013-07-21 LAB — GLUCOSE, CAPILLARY
GLUCOSE-CAPILLARY: 171 mg/dL — AB (ref 70–99)
Glucose-Capillary: 147 mg/dL — ABNORMAL HIGH (ref 70–99)
Glucose-Capillary: 57 mg/dL — ABNORMAL LOW (ref 70–99)
Glucose-Capillary: 101 mg/dL — ABNORMAL HIGH (ref 70–99)
Glucose-Capillary: 141 mg/dL — ABNORMAL HIGH (ref 70–99)

## 2013-07-21 LAB — BASIC METABOLIC PANEL
BUN: 81 mg/dL — ABNORMAL HIGH (ref 6–23)
CHLORIDE: 94 meq/L — AB (ref 96–112)
CO2: 32 meq/L (ref 19–32)
Calcium: 7.2 mg/dL — ABNORMAL LOW (ref 8.4–10.5)
Creatinine, Ser: 2.71 mg/dL — ABNORMAL HIGH (ref 0.50–1.10)
GFR calc non Af Amer: 19 mL/min — ABNORMAL LOW (ref >90)
GFR, EST AFRICAN AMERICAN: 22 mL/min — AB (ref >90)
Glucose, Bld: 131 mg/dL — ABNORMAL HIGH (ref 70–99)
Potassium: 3.4 mEq/L — ABNORMAL LOW (ref 3.7–5.3)
Sodium: 135 mEq/L — ABNORMAL LOW (ref 137–147)

## 2013-07-21 MED ORDER — LORAZEPAM 2 MG/ML IJ SOLN
INTRAMUSCULAR | Status: AC
  Filled 2013-07-21: qty 1

## 2013-07-21 MED ORDER — LORAZEPAM 2 MG/ML IJ SOLN
2.0000 mg | Freq: Once | INTRAMUSCULAR | Status: AC
  Administered 2013-07-21: 2 mg via INTRAVENOUS

## 2013-07-21 MED ORDER — SODIUM CHLORIDE 0.9 % IV SOLN
1500.0000 mg | Freq: Once | INTRAVENOUS | Status: AC
  Administered 2013-07-22: 1500 mg via INTRAVENOUS
  Filled 2013-07-21: qty 15

## 2013-07-21 MED ORDER — OXYCODONE HCL 5 MG PO TABS
5.0000 mg | ORAL_TABLET | Freq: Four times a day (QID) | ORAL | Status: DC | PRN
Start: 2013-07-21 — End: 2013-07-27
  Administered 2013-07-21 – 2013-07-27 (×5): 5 mg via ORAL
  Filled 2013-07-21 (×7): qty 1

## 2013-07-21 MED ORDER — METOPROLOL TARTRATE 1 MG/ML IV SOLN
10.0000 mg | Freq: Four times a day (QID) | INTRAVENOUS | Status: DC
  Administered 2013-07-22 (×4): 10 mg via INTRAVENOUS
  Filled 2013-07-21 (×7): qty 10

## 2013-07-21 MED ORDER — LABETALOL HCL 100 MG PO TABS
100.0000 mg | ORAL_TABLET | Freq: Two times a day (BID) | ORAL | Status: DC
  Administered 2013-07-21: 100 mg via ORAL
  Filled 2013-07-21 (×2): qty 1

## 2013-07-21 MED ORDER — LORAZEPAM 2 MG/ML IJ SOLN
2.0000 mg | INTRAMUSCULAR | Status: DC | PRN
  Filled 2013-07-21: qty 1

## 2013-07-21 MED ORDER — INSULIN GLARGINE 100 UNIT/ML ~~LOC~~ SOLN
50.0000 [IU] | Freq: Every day | SUBCUTANEOUS | Status: DC
  Administered 2013-07-22 – 2013-07-27 (×6): 50 [IU] via SUBCUTANEOUS
  Filled 2013-07-21 (×7): qty 0.5

## 2013-07-21 NOTE — Progress Notes (Signed)
Hypoglycemic Event  CBG: 57  Treatment: PO Juice  Symptoms: none  Follow-up CBG: Time:1759 CBG Result 101  Possible Reasons for Event: ??  Comments/MD notified:Kazibew    Serayah Yazdani, Tilford Pillar  Remember to initiate Hypoglycemia Order Set & complete

## 2013-07-21 NOTE — Progress Notes (Signed)
INTERNAL MEDICINE TEACHING SERVICE DAILY PROGRESS NOTE Subjective: Patient very interactive this AM.  Does not have any acute complaints. Was hypertensive overnight which responded to IV hydralazine.  Hypertensive again this AM. Objective: Vital signs in last 24 hours: Filed Vitals:   07/20/13 2330 07/21/13 0000 07/21/13 0407 07/21/13 0830  BP: 189/79 166/72 144/66 207/93  Pulse: 110 104 81 103  Temp:   99.3 F (37.4 C) 102.8 F (39.3 C)  TempSrc:   Oral Oral  Resp: 28 30 16 26   Height:      Weight:      SpO2: 95% 96% 96% 96%   Weight change:   Intake/Output Summary (Last 24 hours) at 07/21/13 0912 Last data filed at 07/21/13 0838  Gross per 24 hour  Intake 4594.67 ml  Output   1825 ml  Net 2769.67 ml    General: Vital signs reviewed. Resting comfortably in bed, NAD Lungs: Clear Bilateral Heart: Regular; no extra sounds or murmurs  Abdomen: Bowel sounds present, soft, nontender; no hepatosplenomegaly  Extremities: 1+ lower extremity edema to knee bilaterally Neurologic: AAOx3,   Lab Results: Basic Metabolic Panel:  Recent Labs Lab 07/19/13 1700 07/20/13 0612 07/21/13 0330  NA 138 132* 135*  K 3.8 4.1 3.4*  CL 98 94* 94*  CO2 22 23 32  GLUCOSE 118* 409* 131*  BUN 108* 94* 81*  CREATININE 3.63* 3.05* 2.71*  CALCIUM 8.0* 7.4* 7.2*  PHOS 4.9* 3.0  --    Liver Function Tests:  Recent Labs Lab 07/18/13 2238 07/19/13 0525 07/19/13 1700 07/20/13 0612  AST 30  --   --  38*  ALT 38*  --   --  26  ALKPHOS 297*  --   --  234*  BILITOT 10.8* 11.7*  --  9.4*  PROT 6.2  --   --  5.2*  ALBUMIN 1.8*  --  1.6* 1.3*  1.3*   No results found for this basename: LIPASE, AMYLASE,  in the last 168 hours No results found for this basename: AMMONIA,  in the last 168 hours CBC:  Recent Labs Lab 07/18/13 2238  07/20/13 0612 07/20/13 1430 07/20/13 2244  WBC 20.7*  < > 9.8 8.6 8.2  NEUTROABS 17.8*  --  7.1  --   --   HGB 9.3*  < > 7.1* 6.3* 8.2*  HCT 26.8*  < >  21.0* 18.6* 23.6*  MCV 98.2  < > 99.1 98.9 95.5  PLT 159  < > 127* 102* 129*  < > = values in this interval not displayed. Cardiac Enzymes:  Recent Labs Lab 07/19/13 0525  TROPONINI <0.30   CBG:  Recent Labs Lab 07/19/13 2137 07/20/13 0855 07/20/13 1346 07/20/13 1647 07/20/13 2008 07/21/13 0811  GLUCAP 139* 355* 269* 254* 178* 147*   Anemia Panel:  Recent Labs Lab 07/19/13 0525  RETICCTPCT 1.7   Urine Drug Screen: Drugs of Abuse    Urinalysis:  Recent Labs Lab 07/19/13 0400  COLORURINE ORANGE*  LABSPEC 1.021  PHURINE 5.0  GLUCOSEU 250*  HGBUR TRACE*  BILIRUBINUR LARGE*  KETONESUR NEGATIVE  PROTEINUR >300*  UROBILINOGEN 2.0*  NITRITE NEGATIVE  LEUKOCYTESUR SMALL*   Micro Results: Recent Results (from the past 240 hour(s))  CULTURE, BLOOD (ROUTINE X 2)     Status: None   Collection Time    07/19/13 12:30 AM      Result Value Ref Range Status   Specimen Description BLOOD RIGHT HAND   Final   Special Requests BOTTLES DRAWN AEROBIC  AND ANAEROBIC [facility] EACH   Final   Culture  Setup Time     Final   Value: 07/19/2013 08:47     Performed at [facility]   Culture     Final   Value: ESCHERICHIA COLI     Note: Gram Stain Report Called to,Read Back By and Verified With: Sherolyn Buba 07/19/13 AT 8:22PM THOMI     Performed at [facility]   Report Status 07/21/2013 FINAL   Final   Organism ID, Bacteria ESCHERICHIA COLI   Final  MRSA PCR SCREENING     Status: Abnormal   Collection Time    07/19/13  3:52 AM      Result Value Ref Range Status   MRSA by PCR POSITIVE (*) NEGATIVE Final   Comment:            The GeneXpert MRSA Assay (FDA     approved for NASAL specimens     only), is one component of a     comprehensive MRSA colonization     surveillance program. It is not     intended to diagnose MRSA     infection nor to guide or     monitor treatment for     MRSA infections.     RESULT CALLED TO, READ BACK BY AND VERIFIED WITH:     C  PORTER,RN 07/19/13 0827 BY K SCHULTZ  CLOSTRIDIUM DIFFICILE BY PCR     Status: None   Collection Time    07/20/13 11:18 AM      Result Value Ref Range Status   C difficile by pcr NEGATIVE  NEGATIVE Final   Studies/Results: US Abdomen Complete  07/19/2013   CLINICAL DATA:  Right upper quadrant abdominal tenderness, renal failure.  EXAM: ULTRASOUND ABDOMEN COMPLETE  COMPARISON:  None.  FINDINGS: Gallbladder:  Sludge is noted within the gallbladder lumen. No definite stones or gallbladder wall thickening is noted. No sonographic Murphy's sign is noted.  Common bile duct:  Diameter: Measures 2.4 mm which is within normal limits.  Liver:  Contours are slightly nodular suggesting possible hepatic cirrhosis. No focal abnormality is noted.  IVC:  No abnormality visualized.  Pancreas:  Visualized portion unremarkable.  Spleen:  Size and appearance within normal limits.  Right Kidney:  Length: 12.2 cm. No mass or hydronephrosis is noted. Increased echogenicity of renal parenchyma is noted suggesting medical renal disease.  Left Kidney:  Length: 12.7 cm. Kidney is not well visualized due to overlying bowel gas.  Abdominal aorta:  No aneurysm visualized.  Other findings:  None.  IMPRESSION: Sludge is noted within the gallbladder lumen.  Hepatic contours are slightly nodular suggesting the possibility of hepatic cirrhosis. Clinical correlation is recommended.  Increased echogenicity of renal parenchyma is noted suggesting medical renal disease.   Electronically Signed   By: Roque Lias M.D.   On: 07/19/2013 16:00   Medications: I have reviewed the patient's current medications. Scheduled Meds: . abacavir  600 mg Oral Daily  . acetaminophen  650 mg Oral Once  . antiseptic oral rinse  15 mL Mouth Rinse BID  . aspirin EC  81 mg Oral Daily  . Chlorhexidine Gluconate Cloth  6 each Topical Q0600  . cloNIDine  0.2 mg Oral BID  . Darunavir Ethanolate  800 mg Oral Q breakfast  . heparin  5,000 Units Subcutaneous 3  times per day  . insulin aspart  0-20 Units Subcutaneous TID WC  . insulin aspart  8 Units Subcutaneous  TID WC  . insulin glargine  60 Units Subcutaneous Daily  . labetalol  100 mg Oral BID  . lamiVUDine  100 mg Oral Daily  . levETIRAcetam  1,500 mg Oral Q12H  . [START ON 07/22/2013] levofloxacin (LEVAQUIN) IV  500 mg Intravenous Q48H  . mupirocin ointment  1 application Nasal BID  . pantoprazole  40 mg Oral Daily  . phenytoin  200 mg Oral BID  . ritonavir  100 mg Oral Q breakfast  . sodium chloride  3 mL Intravenous Q12H   Continuous Infusions:   PRN Meds:.acetaminophen, ondansetron (ZOFRAN) IV, ondansetron Assessment/Plan: Michelle [name] is a 53 year old woman with past medical history of dCHF (EF normal, with Grade-2 diastolic dysfunction), HIV (CD4 490 and VL<20 on 05/07/13), chronic HCV, seizure disorder, hx of stroke, HTN, insulin dependent Type II DM, CKD, gout, and AIHA who presents with altered mental status.   # Sepsis E coli Bacteremia versus Pneumonia: Blood culture 1/2 on 07/19/2013 revealed E coli. Possible source of infection is GI versus urinary tract. However, Urinalysis 2/20 negative for nitrites. She continues to be febrile, tachycardic and tachypnenic but BP is stable. Abdomen ultrasound scan, remarkable for sludge, but without features of cholecystitis. Chest x-Blacketer to 20/15 with the right medial basilar airspace opacity, concerning for pneumonia. E Coli sensitivity to Cipro. Resistant to Ampicillin, Bactrim Plan  -  Levaquin (renal dosed) due to allergy - follow repeat BCx - transfer to Telemetry   # Acute Kidney Injury on CKD - Improving. Biopsy showed collapsing FSGS. HD catheter inserted 07/19/2013. Patient Creatine almost at baseline. -Renal assisting in her care - Will need Vein mapping and permanent access placed.  Will remove temporary catheter OK with Renal. 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 LDH elevated, bilirubin elevated (however direct and not indirect), hemoglobin stable from discharge. Low sucipicion for active hemolysis  - monitor CBC - hold off steroids for now in the setting of active infection - Patient did receive 1 unit of PRBC with improvement from 6.3>8.2 (may have been partially dilutional given IVF and holding lasix.)  # Increased Anion Gap Metabolic Acidosis resolved:  Secondary to uremia, associated with acute on chronic renal insufficiency.  Plan -D/C Bicarb drip  # Diarrhea: improving - C diff neg.  # Diabetes: improved glycemic control - Lantus 60 units  - Novolog 8 units TIDWC -SSI-R - Carb mod diet   # Hypertension : Hypertensive overnight On home clonidine, lasix, labetalol, lisinopril, hydralazine. Initially BP was low but currently elevated.  -clonidine 0.2 mg bid - Add Labetalol 100mg  BID back  - If BP not improved will add back PO hydralazine  # HIV - last CD4 = 490 (05/07/13)  -continue abacavir, prezista, lamivudine (renal adjust to 100mg ), norvir   # Seizure disorder - family member notes 2 seizures 3-4 days prior to admission. Dilantin level corrects to appropriate level adjusting for albumin.  -continue home keppra, dilantin   Dispo: Disposition is deferred at this time, awaiting improvement of current medical problems.  Anticipated discharge in approximately 2 day(s).   The patient does have a current PCP Burtis Junes, Arna Medici, MD), therefore will be requiring OPC follow-up after discharge.   The patient does not have transportation limitations that hinder transportation to clinic appointments.  .Services Needed at time of discharge: Y = Yes, Blank = No PT:   OT:   RN:   Equipment:   Other:  LOS: 3 days   Gust Rung PGY 1 - Internal Medicine Teaching Service Pager: [phone]  07/21/2013, 9:12 AM

## 2013-07-21 NOTE — Progress Notes (Signed)
Brief Interval Progress Note  S: I was called to the room around 10:40 pm.  This evening, the patient was alert and oriented, and appropriately conversational.  Around 10:36 pm, the patient experienced sudden onset of right arm and leg shaking, as well as acute confusion, with inappropriate speech.  The episode lasted 3-4 minutes.  However, when I arrive in the room around 10:50 pm, the patient is still having mild RUE and RLE shaking, as well as mild confusion.  O: Filed Vitals:   07/21/13 1700 07/21/13 1800 07/21/13 1900 07/21/13 1957  BP: 152/76 158/83 162/73 170/76  Pulse: 73 80 79 83  Temp:    98.1 F (36.7 C)  TempSrc:    Oral  Resp: 20 17 23 26   Height:      Weight:      SpO2: 97% 98% 96% 98%  Gen: Alert, talking, but with incomprehensible words Ext: RUE and RLE with rhythmic "shaking"  A/P The patient has a history of seizures, poorly-controlled at baseline, with subtherapeutic phenytoin level in the past.  CBGs have been low today, but most recent CBG was 141, one hour ago.  This episode likely represents a recurrent seizure. -give Ativan 2 mg IV once now -if RUE and RLE shaking continue, give another Ativan 2 mg IV -ordered Ativan 2 mg IV prn for additional episodes -ordered BMET, Mg, Phenytoin level -consider consulting neurology in AM.  If seizures recur tonight or are unable to be controlled with Ativan, will consult neuro tonight.   SignedJanalyn Harder, PGY3 07/21/2013, 11:03 PM

## 2013-07-21 NOTE — Progress Notes (Signed)
Patient ID: Michelle [name], female   DOB: 1961-05-14, 53 y.o.   MRN: [id number] S:Feels better today O:BP 207/93  Pulse 103  Temp(Src) 99.3 F (37.4 C) (Oral)  Resp 26  Ht 5' 5.5" (1.664 m)  Wt 84.2 kg (185 lb 10 oz)  BMI 30.41 kg/m2  SpO2 96%  Intake/Output Summary (Last 24 hours) at 07/21/13 0857 Last data filed at 07/21/13 8616  Gross per 24 hour  Intake 4719.67 ml  Output   1825 ml  Net 2894.67 ml   Intake/Output: I/O last 3 completed shifts: In: 5450.5 [P.O.:960; I.V.:4003; Blood:237.5; IV Piggyback:250] Out: 1750 [Urine:1750]  Intake/Output this shift:  Total I/O In: 689.2 [P.O.:360; I.V.:329.2] Out: 550 [Urine:550] Weight change:  Gen:WD AAF in NAD CVS:no rub Resp:occ rhonchi bilaterally Abd:+BS,soft, NT Ext:tr pretib edema   Recent Labs Lab 07/18/13 2238 07/18/13 2312 07/19/13 0525 07/19/13 0900 07/19/13 1300 07/19/13 1700 07/20/13 0612 07/21/13 0330  NA 132* 130* 134* 134* 136* 138 132* 135*  K 4.5 4.3 4.7 3.6* 3.6* 3.8 4.1 3.4*  CL 93* 98 94* 98 99 98 94* 94*  CO2 17*  --  14* 18* 20 22 23  32  GLUCOSE 500* 524* 181* 193* 133* 118* 409* 131*  BUN 123* 124* 116* 119* 116* 108* 94* 81*  CREATININE 4.23* 4.60* 4.12* 4.18* 3.88* 3.63* 3.05* 2.71*  ALBUMIN 1.8*  --   --   --   --  1.6* 1.3*  1.3*  --   CALCIUM 8.0*  --  8.3* 7.9* 8.0* 8.0* 7.4* 7.2*  PHOS  --   --   --   --   --  4.9* 3.0  --   AST 30  --   --   --   --   --  38*  --   ALT 38*  --   --   --   --   --  26  --    Liver Function Tests:  Recent Labs Lab 07/18/13 2238 07/19/13 0525 07/19/13 1700 07/20/13 0612  AST 30  --   --  38*  ALT 38*  --   --  26  ALKPHOS 297*  --   --  234*  BILITOT 10.8* 11.7*  --  9.4*  PROT 6.2  --   --  5.2*  ALBUMIN 1.8*  --  1.6* 1.3*  1.3*   No results found for this basename: LIPASE, AMYLASE,  in the last 168 hours No results found for this basename: AMMONIA,  in the last 168 hours CBC:  Recent Labs Lab 07/18/13 2238  07/19/13 0525  07/20/13 0612 07/20/13 1430 07/20/13 2244  WBC 20.7*  --  17.6* 9.8 8.6 8.2  NEUTROABS 17.8*  --   --  7.1  --   --   HGB 9.3*  < > 9.7* 7.1* 6.3* 8.2*  HCT 26.8*  < > 28.2* 21.0* 18.6* 23.6*  MCV 98.2  --  98.3 99.1 98.9 95.5  PLT 159  --  157 127* 102* 129*  < > = values in this interval not displayed. Cardiac Enzymes:  Recent Labs Lab 07/19/13 0525  TROPONINI <0.30   CBG:  Recent Labs Lab 07/20/13 0855 07/20/13 1346 07/20/13 1647 07/20/13 2008 07/21/13 0811  GLUCAP 355* 269* 254* 178* 147*    Iron Studies: No results found for this basename: IRON, TIBC, TRANSFERRIN, FERRITIN,  in the last 72 hours Studies/Results: US Abdomen Complete  07/19/2013   CLINICAL DATA:  Right upper quadrant abdominal tenderness,  renal failure.  EXAM: ULTRASOUND ABDOMEN COMPLETE  COMPARISON:  None.  FINDINGS: Gallbladder:  Sludge is noted within the gallbladder lumen. No definite stones or gallbladder wall thickening is noted. No sonographic Murphy's sign is noted.  Common bile duct:  Diameter: Measures 2.4 mm which is within normal limits.  Liver:  Contours are slightly nodular suggesting possible hepatic cirrhosis. No focal abnormality is noted.  IVC:  No abnormality visualized.  Pancreas:  Visualized portion unremarkable.  Spleen:  Size and appearance within normal limits.  Right Kidney:  Length: 12.2 cm. No mass or hydronephrosis is noted. Increased echogenicity of renal parenchyma is noted suggesting medical renal disease.  Left Kidney:  Length: 12.7 cm. Kidney is not well visualized due to overlying bowel gas.  Abdominal aorta:  No aneurysm visualized.  Other findings:  None.  IMPRESSION: Sludge is noted within the gallbladder lumen.  Hepatic contours are slightly nodular suggesting the possibility of hepatic cirrhosis. Clinical correlation is recommended.  Increased echogenicity of renal parenchyma is noted suggesting medical renal disease.   Electronically Signed   By: Roque LiasJames  Green M.D.   On:  07/19/2013 16:00   . abacavir  600 mg Oral Daily  . acetaminophen  650 mg Oral Once  . antiseptic oral rinse  15 mL Mouth Rinse BID  . aspirin EC  81 mg Oral Daily  . Chlorhexidine Gluconate Cloth  6 each Topical Q0600  . cloNIDine  0.2 mg Oral BID  . Darunavir Ethanolate  800 mg Oral Q breakfast  . heparin  5,000 Units Subcutaneous 3 times per day  . insulin aspart  0-20 Units Subcutaneous TID WC  . insulin aspart  8 Units Subcutaneous TID WC  . insulin glargine  60 Units Subcutaneous Daily  . labetalol  100 mg Oral BID  . lamiVUDine  100 mg Oral Daily  . levETIRAcetam  1,500 mg Oral Q12H  . [START ON 07/22/2013] levofloxacin (LEVAQUIN) IV  500 mg Intravenous Q48H  . mupirocin ointment  1 application Nasal BID  . pantoprazole  40 mg Oral Daily  . phenytoin  200 mg Oral BID  . ritonavir  100 mg Oral Q breakfast  . sodium chloride  3 mL Intravenous Q12H    BMET    Component Value Date/Time   NA 135* 07/21/2013 0330   K 3.4* 07/21/2013 0330   CL 94* 07/21/2013 0330   CO2 32 07/21/2013 0330   GLUCOSE 131* 07/21/2013 0330   BUN 81* 07/21/2013 0330   CREATININE 2.71* 07/21/2013 0330   CREATININE 2.00* 06/28/2013 1443   CALCIUM 7.2* 07/21/2013 0330   GFRNONAA 19* 07/21/2013 0330   GFRAA 22* 07/21/2013 0330   CBC    Component Value Date/Time   WBC 8.2 07/20/2013 2244   RBC 2.47* 07/20/2013 2244   RBC 2.87* 07/19/2013 0525   HGB 8.2* 07/20/2013 2244   HCT 23.6* 07/20/2013 2244   PLT 129* 07/20/2013 2244   MCV 95.5 07/20/2013 2244   MCH 33.2 07/20/2013 2244   MCHC 34.7 07/20/2013 2244   RDW 18.8* 07/20/2013 2244   LYMPHSABS 1.5 07/20/2013 0612   MONOABS 1.2* 07/20/2013 0612   EOSABS 0.0 07/20/2013 0612   BASOSABS 0.0 07/20/2013 0612     Assessment/Plan:  1. AKI/CKD- in setting of SIRS/hypotension and ACE-I. She unfortunately has a poor prognosis for renal survival with collapsing FSGS but the current episode is likely ischemic ATN and hopefully reversible. Given her multiple comorbidities,  would like to keep off of dialysis for as  long as possible. She will also require permanent access placement when stable as well as education on options for RRT.  1. Cont to hold ACE/ARB 2. Scr improving with fluid resuscitation. 3. OK to stop IVF's as she is taking po's 2. SIRS- from E coli bacteremia- on broad spectrum abx,  1. Abx change per primary team 2. No evidence of cholecystitis on [place] 3. Metabolic acidosis- due to #1: improved with IVF's 1. Stop isotonic bicarb as CO2 now up to 32 4. Collapsing FSGS- as above poor renal survival with this dx and would not add steroids as risks outweigh benefits and hold ACE/ARB given AKI  1. Will need vein mapping, education, and permanent access placed once bacteremia is resolved 2. F/u with Dr.Sanford in our office as an outpt when stable for d/c 5. AIHA- follow H/H 6. Hyperbilirubinemia- mainly direct. ABD US unremarkable 7. Hyperglycemia- improved with insulin 8. AMS- ?post-ictal vs. Metabolic encephalopathy. Will cont to follow 9. HIV- cont with meds 10. HCV- per ID 11. HTN- stable 12.   Ellan Tess A

## 2013-07-21 NOTE — Progress Notes (Signed)
Pt's blood pressure was trending up at the beginning of the shift.  At 2100 BP was 191/92, HS Clonidine 0.2mg  was scheduled and was given at 2130.  By 2200 BP was 185/86, but went back up to 202/81 at 2300.  Called MD on call and notified her of same.  PRN Hydralazine 5mg  iv was ordered and was given at 2307 with good results. Midnight BP was 166/72 and by 0200 was 155/68.  Will continue to monitor.

## 2013-07-22 ENCOUNTER — Encounter (HOSPITAL_COMMUNITY): Payer: Self-pay

## 2013-07-22 DIAGNOSIS — N184 Chronic kidney disease, stage 4 (severe): Secondary | ICD-10-CM

## 2013-07-22 DIAGNOSIS — R569 Unspecified convulsions: Secondary | ICD-10-CM

## 2013-07-22 LAB — GLUCOSE, CAPILLARY
GLUCOSE-CAPILLARY: 46 mg/dL — AB (ref 70–99)
GLUCOSE-CAPILLARY: 69 mg/dL — AB (ref 70–99)
GLUCOSE-CAPILLARY: 109 mg/dL — AB (ref 70–99)
GLUCOSE-CAPILLARY: 224 mg/dL — AB (ref 70–99)
GLUCOSE-CAPILLARY: 227 mg/dL — AB (ref 70–99)
Glucose-Capillary: 206 mg/dL — ABNORMAL HIGH (ref 70–99)
Glucose-Capillary: 133 mg/dL — ABNORMAL HIGH (ref 70–99)

## 2013-07-22 LAB — BILIRUBIN, FRACTIONATED(TOT/DIR/INDIR)
BILIRUBIN INDIRECT: 1.3 mg/dL — AB (ref 0.3–0.9)
Bilirubin, Direct: 5.7 mg/dL — ABNORMAL HIGH (ref 0.0–0.3)
Total Bilirubin: 7 mg/dL — ABNORMAL HIGH (ref 0.3–1.2)

## 2013-07-22 LAB — BASIC METABOLIC PANEL
BUN: 66 mg/dL — ABNORMAL HIGH (ref 6–23)
CALCIUM: 7.8 mg/dL — AB (ref 8.4–10.5)
CHLORIDE: 97 meq/L (ref 96–112)
CO2: 32 mEq/L (ref 19–32)
CREATININE: 2.59 mg/dL — AB (ref 0.50–1.10)
GFR, EST AFRICAN AMERICAN: 23 mL/min — AB (ref >90)
GFR, EST NON AFRICAN AMERICAN: 20 mL/min — AB (ref >90)
Glucose, Bld: 43 mg/dL — CL (ref 70–99)
Potassium: 3.4 mEq/L — ABNORMAL LOW (ref 3.7–5.3)
Sodium: 140 mEq/L (ref 137–147)

## 2013-07-22 LAB — CBC
HEMATOCRIT: 25.2 % — AB (ref 36.0–46.0)
Hemoglobin: 8.5 g/dL — ABNORMAL LOW (ref 12.0–15.0)
MCH: 32.6 pg (ref 26.0–34.0)
MCHC: 33.7 g/dL (ref 30.0–36.0)
MCV: 96.6 fL (ref 78.0–100.0)
Platelets: 167 10*3/uL (ref 150–400)
RBC: 2.61 MIL/uL — ABNORMAL LOW (ref 3.87–5.11)
RDW: 18.8 % — AB (ref 11.5–15.5)
WBC: 9.2 10*3/uL (ref 4.0–10.5)

## 2013-07-22 LAB — PHENYTOIN LEVEL, TOTAL: Phenytoin Lvl: 2.9 ug/mL — ABNORMAL LOW (ref 10.0–20.0)

## 2013-07-22 LAB — MAGNESIUM: Magnesium: 1.8 mg/dL (ref 1.5–2.5)

## 2013-07-22 MED ORDER — SODIUM CHLORIDE 0.9 % IV SOLN
200.0000 mg | Freq: Once | INTRAVENOUS | Status: AC
  Administered 2013-07-22: 200 mg via INTRAVENOUS
  Filled 2013-07-22: qty 4

## 2013-07-22 MED ORDER — LABETALOL HCL 200 MG PO TABS
200.0000 mg | ORAL_TABLET | Freq: Two times a day (BID) | ORAL | Status: DC
  Administered 2013-07-22 – 2013-07-27 (×11): 200 mg via ORAL
  Filled 2013-07-22 (×13): qty 1

## 2013-07-22 MED ORDER — SODIUM CHLORIDE 0.9 % IV SOLN
500.0000 mg | INTRAVENOUS | Status: DC
  Filled 2013-07-22: qty 10

## 2013-07-22 MED ORDER — POTASSIUM CHLORIDE CRYS ER 20 MEQ PO TBCR
40.0000 meq | EXTENDED_RELEASE_TABLET | Freq: Once | ORAL | Status: AC
  Administered 2013-07-22: 40 meq via ORAL
  Filled 2013-07-22: qty 2

## 2013-07-22 MED ORDER — ACETAMINOPHEN 650 MG RE SUPP
650.0000 mg | Freq: Once | RECTAL | Status: AC
  Administered 2013-07-22: 650 mg via RECTAL
  Filled 2013-07-22: qty 1

## 2013-07-22 MED ORDER — PHENYTOIN 50 MG PO CHEW
250.0000 mg | CHEWABLE_TABLET | Freq: Every day | ORAL | Status: DC
  Filled 2013-07-22: qty 5

## 2013-07-22 MED ORDER — PHENYTOIN SODIUM 50 MG/ML IJ SOLN
200.0000 mg | Freq: Two times a day (BID) | INTRAMUSCULAR | Status: DC
  Administered 2013-07-22: 200 mg via INTRAVENOUS
  Filled 2013-07-22 (×3): qty 4

## 2013-07-22 MED ORDER — LORAZEPAM 2 MG/ML IJ SOLN
1.0000 mg | Freq: Every day | INTRAMUSCULAR | Status: DC
  Administered 2013-07-23 – 2013-07-26 (×4): 1 mg via INTRAVENOUS
  Filled 2013-07-22 (×4): qty 1

## 2013-07-22 MED ORDER — LEVETIRACETAM 500 MG PO TABS
500.0000 mg | ORAL_TABLET | Freq: Two times a day (BID) | ORAL | Status: DC
  Administered 2013-07-22 – 2013-07-27 (×10): 500 mg via ORAL
  Filled 2013-07-22 (×12): qty 1

## 2013-07-22 MED ORDER — FUROSEMIDE 80 MG PO TABS
160.0000 mg | ORAL_TABLET | Freq: Two times a day (BID) | ORAL | Status: DC
  Administered 2013-07-22 – 2013-07-27 (×10): 160 mg via ORAL
  Filled 2013-07-22 (×13): qty 2

## 2013-07-22 MED ORDER — PHENYTOIN 50 MG PO CHEW: 200.0000 mg | CHEWABLE_TABLET | Freq: Every day | ORAL | Status: DC

## 2013-07-22 MED ORDER — HYDRALAZINE HCL 50 MG PO TABS
50.0000 mg | ORAL_TABLET | Freq: Three times a day (TID) | ORAL | Status: DC
  Administered 2013-07-22 – 2013-07-27 (×16): 50 mg via ORAL
  Filled 2013-07-22 (×19): qty 1

## 2013-07-22 NOTE — Progress Notes (Signed)
[facility] ROUNDING NOTE   Subjective:   Interval History: no complaints this morning   Objective:  Vital signs in last 24 hours:  Temp:  [97.9 F (36.6 C)-102.9 F (39.4 C)] 97.9 F (36.6 C) (02/23 0352) Pulse Rate:  [73-111] 78 (02/23 0800) Resp:  [17-32] 23 (02/23 0800) BP: (117-178)/(59-108) 159/108 mmHg (02/23 0800) SpO2:  [94 %-98 %] 94 % (02/23 0800) Weight:  [85 kg (187 lb 6.3 oz)] 85 kg (187 lb 6.3 oz) (02/23 0352)  Weight change:  Filed Weights   07/20/13 0400 07/20/13 0530 07/22/13 0352  Weight: 84.8 kg (186 lb 15.2 oz) 84.2 kg (185 lb 10 oz) 85 kg (187 lb 6.3 oz)    Intake/Output: I/O last 3 completed shifts: In: 3135.2 [P.O.:1200; I.V.:1710.2; Blood:225] Out: 2475 [Urine:2475]   Intake/Output this shift:     CVS- RRR RS- CTA ABD- BS present soft non-distended EXT-trace edema   Basic Metabolic Panel:  Recent Labs Lab 07/19/13 1300 07/19/13 1700 07/20/13 0612 07/21/13 0330 07/22/13 0335  NA 136* 138 132* 135* 140  K 3.6* 3.8 4.1 3.4* 3.4*  CL 99 98 94* 94* 97  CO2 20 22 23  32 32  GLUCOSE 133* 118* 409* 131* 43*  BUN 116* 108* 94* 81* 66*  CREATININE 3.88* 3.63* 3.05* 2.71* 2.59*  CALCIUM 8.0* 8.0* 7.4* 7.2* 7.8*  MG  --   --   --   --  1.8  PHOS  --  4.9* 3.0  --   --     Liver Function Tests:  Recent Labs Lab 07/18/13 2238 07/19/13 0525 07/19/13 1700 07/20/13 0612 07/22/13 0335  AST 30  --   --  38*  --   ALT 38*  --   --  26  --   ALKPHOS 297*  --   --  234*  --   BILITOT 10.8* 11.7*  --  9.4* 7.0*  PROT 6.2  --   --  5.2*  --   ALBUMIN 1.8*  --  1.6* 1.3*  1.3*  --    No results found for this basename: LIPASE, AMYLASE,  in the last 168 hours No results found for this basename: AMMONIA,  in the last 168 hours  CBC:  Recent Labs Lab 07/18/13 2238  07/19/13 0525 07/20/13 0612 07/20/13 1430 07/20/13 2244 07/22/13 0335  WBC 20.7*  --  17.6* 9.8 8.6 8.2 9.2  NEUTROABS 17.8*  --   --  7.1  --   --   --    HGB 9.3*  < > 9.7* 7.1* 6.3* 8.2* 8.5*  HCT 26.8*  < > 28.2* 21.0* 18.6* 23.6* 25.2*  MCV 98.2  --  98.3 99.1 98.9 95.5 96.6  PLT 159  --  157 127* 102* 129* 167  < > = values in this interval not displayed.  Cardiac Enzymes:  Recent Labs Lab 07/19/13 0525  TROPONINI <0.30    BNP: No components found with this basename: POCBNP,   CBG:  Recent Labs Lab 07/21/13 2155 07/22/13 0455 07/22/13 0519 07/22/13 0537 07/22/13 0751  GLUCAP 141* 46* 69* 109* 224*    Microbiology: Results for orders placed during the hospital encounter of 07/18/13  CULTURE, BLOOD (ROUTINE X 2)     Status: None   Collection Time    07/19/13 12:30 AM      Result Value Ref Range Status   Specimen Description BLOOD RIGHT HAND   Final   Special Requests BOTTLES DRAWN AEROBIC AND ANAEROBIC 5CC  [facility]   Final   Culture  Setup Time     Final   Value: 07/19/2013 08:47     Performed at [facility]   Culture     Final   Value: ESCHERICHIA COLI     Note: Gram Stain Report Called to,Read Back By and Verified With: Sherolyn Buba 07/19/13 AT 8:22PM THOMI     Performed at [facility]   Report Status 07/21/2013 FINAL   Final   Organism ID, Bacteria ESCHERICHIA COLI   Final  MRSA PCR SCREENING     Status: Abnormal   Collection Time    07/19/13  3:52 AM      Result Value Ref Range Status   MRSA by PCR POSITIVE (*) NEGATIVE Final   Comment:            The GeneXpert MRSA Assay (FDA     approved for NASAL specimens     only), is one component of a     comprehensive MRSA colonization     surveillance program. It is not     intended to diagnose MRSA     infection nor to guide or     monitor treatment for     MRSA infections.     RESULT CALLED TO, READ BACK BY AND VERIFIED WITH:     C PORTER,RN 07/19/13 0827 BY K SCHULTZ  CULTURE, BLOOD (ROUTINE X 2)     Status: None   Collection Time    07/19/13 11:40 PM      Result Value Ref Range Status   Specimen Description BLOOD RIGHT ARM   Final    Special Requests BOTTLES DRAWN AEROBIC AND ANAEROBIC 5CC EACH   Final   Culture  Setup Time     Final   Value: 07/20/2013 03:34     Performed at [facility]   Culture     Final   Value:        BLOOD CULTURE RECEIVED NO GROWTH TO DATE CULTURE WILL BE HELD FOR 5 DAYS BEFORE ISSUING A FINAL NEGATIVE REPORT     Performed at [facility]   Report Status PENDING   Incomplete  CULTURE, BLOOD (ROUTINE X 2)     Status: None   Collection Time    07/19/13 11:57 PM      Result Value Ref Range Status   Specimen Description BLOOD RIGHT ARM   Final   Special Requests BOTTLES DRAWN AEROBIC AND ANAEROBIC 5CC EACH   Final   Culture  Setup Time     Final   Value: 07/20/2013 03:36     Performed at [facility]   Culture     Final   Value:        BLOOD CULTURE RECEIVED NO GROWTH TO DATE CULTURE WILL BE HELD FOR 5 DAYS BEFORE ISSUING A FINAL NEGATIVE REPORT     Performed at [facility]   Report Status PENDING   Incomplete  CLOSTRIDIUM DIFFICILE BY PCR     Status: None   Collection Time    07/20/13 11:18 AM      Result Value Ref Range Status   C difficile by pcr NEGATIVE  NEGATIVE Final    Coagulation Studies: No results found for this basename: LABPROT, INR,  in the last 72 hours  Urinalysis: No results found for this basename: COLORURINE, APPERANCEUR, LABSPEC, PHURINE, GLUCOSEU, HGBUR, BILIRUBINUR, KETONESUR, PROTEINUR, UROBILINOGEN, NITRITE, LEUKOCYTESUR,  in the last 72 hours  Imaging: No results found.   Medications:     . abacavir  600 mg Oral Daily  . acetaminophen  650 mg Oral Once  . antiseptic oral rinse  15 mL Mouth Rinse BID  . aspirin EC  81 mg Oral Daily  . Chlorhexidine Gluconate Cloth  6 each Topical Q0600  . cloNIDine  0.2 mg Oral BID  . Darunavir Ethanolate  800 mg Oral Q breakfast  . heparin  5,000 Units Subcutaneous 3 times per day  . hydrALAZINE  50 mg Oral TID  . insulin aspart  0-20 Units Subcutaneous TID WC  . insulin  glargine  50 Units Subcutaneous Daily  . lamiVUDine  100 mg Oral Daily  . levETIRAcetam  1,500 mg Oral Q12H  . levofloxacin (LEVAQUIN) IV  500 mg Intravenous Q48H  . LORazepam      . metoprolol  10 mg Intravenous 4 times per day  . mupirocin ointment  1 application Nasal BID  . pantoprazole  40 mg Oral Daily  . phenytoin  200 mg Oral BID  . ritonavir  100 mg Oral Q breakfast  . sodium chloride  3 mL Intravenous Q12H   acetaminophen, LORazepam, ondansetron (ZOFRAN) IV, ondansetron, oxyCODONE  Assessment/ Plan:   Michelle [name] is a 53 year old woman with past medical history of dCHF (EF normal, with Grade-2 diastolic dysfunction), HIV (CD4 490 and VL<20 on 05/07/13), chronic HCV, seizure disorder, hx of stroke, HTN, insulin dependent Type II DM, CKD, gout. 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  1 ATN  Appears to be improving   Creatinine  1.37  9/14       2.4   12/14       2.1   1/15    2. FSGS  Continue antiretrovirals  3. HTN appears to be labile  Will start  Labetalol  And stop clonidine   4. Anemia  Hb  8.5    Will sign off can follow up with Dr Marisue Humble      LOS: 4 Liller Yohn W @TODAY @9 :57 AM

## 2013-07-22 NOTE — Progress Notes (Signed)
Pt arrived to 3E23 from [facility].  Pt has been oriented to room and to unit.  Vitals taken.  BP 121/64, HR 92, R 24, Temp 99.8, and 100% on RA.  Pt placed on telemetry and currently NSR.  Pt information confirmed with CCMD.  Pt resting comfortably in bed and appears in no acute distress.  RN to review transfer orders. Nino Glow RN

## 2013-07-22 NOTE — Consult Note (Addendum)
NEURO HOSPITALIST CONSULT NOTE    Reason for Consult: seizure  HPI:                                                                                                                                           Majority of histroy obtained from chart as patient is not a good historian and cannot give me history on preceding events to hospitalization or who prescribes her medications.   Michelle [name] is an 53 y.o. 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 Patient was admitted with AMS, chills, polydipsia, polyphagia, and work up revealed hyperglycemia, AKI/CKD, and profound metabolic acidosis. Patient currently in hospital being treated for PNA and acute on chronic kidney disease.   In addition per cousin, She was doing well with seizures until Monday night when she was reported to have a seizure and also again on Tuesday night. Per cousin it was questionable if she was correctly taking her medications this past week (including seizure medications). While in hospital patient had another 14 minute seizure consisting of right arm and leg shaking.  She received a total of 2 mg Ativan. Dilantin level was ordered and found to be subtherapeutic at 2.9 Corrected 8.1.   Currently she is alert and oriented but slow to give answers and at time they are not the correct answer.  She is able to follow all my commands. No clinical seizure noted. Levaquin has been D/c'd and Cefazolin has been initiated.    Past Medical History  Diagnosis Date  . Seizures   . Stroke   . Meningitis   . HIV (human immunodeficiency virus infection)   . Hypertension   . Gout   . Muscle spasms of head and/or neck   . CKD (chronic kidney disease)   . CHF (congestive heart failure)     [name] 06/18/2013  . HCV (hepatitis C virus)     chronic/notes 06/18/2013   . Type II diabetes mellitus     [name] 06/18/2013  . AIHA (autoimmune hemolytic anemia)     [name] 06/18/2013  . Hypertensive encephalopathy ~ 05/2013    hospitalaized/notes 06/18/2013  . Daily headache     "for the last 6 years/notes 06/18/2013  . Exertional shortness of breath     [name] 06/18/2013  . Anxiety     [name] 06/18/2013  . Nephrotic syndrome   . History of syphilis   . High cholesterol     Past Surgical History  Procedure Laterality Date  . Hip pinning Right     Family History  Problem Relation Age of Onset  . Cancer - Colon Mother   . Cancer Father   .  Diabetes    . Hypertension Father     Social History:  reports that she quit smoking about 3 years ago. Her smoking use included Cigarettes. She smoked 0.00 packs per day. She has never used smokeless tobacco. She reports that she does not drink alcohol or use illicit drugs.  Allergies  Allergen Reactions  . 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 Norvasc [Amlodipine Besylate]     Itching, rash , hives .   [name] Morphine And Related Hives, Itching and Rash    MEDICATIONS:                                                                                                                     Prior to Admission:  Prescriptions prior to admission  Medication Sig Dispense Refill  . abacavir (ZIAGEN) 300 MG tablet Take 2 tablets (600 mg total) by mouth daily.  60 tablet  6  . acyclovir (ZOVIRAX) 800 MG tablet Take 1 tablet (800 mg total) by mouth daily.  30 tablet  3  . aspirin EC 81 MG tablet Take 1 tablet (81 mg total) by mouth daily.  30 tablet  11  . cholecalciferol (VITAMIN D) 1000 UNITS tablet Take 1,000 Units by mouth daily.      . cloNIDine (CATAPRES) 0.2 MG tablet Take 1 tablet (0.2 mg total) by mouth 2 (two) times daily.  60 tablet  3  . Darunavir Ethanolate (PREZISTA) 800 MG tablet Take 1 tablet (800 mg total) by mouth daily with breakfast.  30 tablet  6  .  furosemide (LASIX) 80 MG tablet Take 2 tablets (160 mg total) by mouth 2 (two) times daily.  120 tablet  0  . gentamicin (GARAMYCIN) 0.3 % ophthalmic ointment Place 1 application into both eyes 4 (four) times daily.      [name] glucose blood (ACCU-CHEK AVIVA) test strip Use as instructed  100 each  12  . hydrALAZINE (APRESOLINE) 50 MG tablet Take 50 mg by mouth 3 (three) times daily.      . insulin aspart (NOVOLOG) 100 UNIT/ML injection Inject into the skin 3 (three) times daily before meals. Home Sliding scale      . insulin glargine (LANTUS) 100 UNIT/ML injection Inject 30 Units into the skin at bedtime.      [name] labetalol (NORMODYNE) 100 MG tablet Take 1 tablet (100 mg total) by mouth 2 (two) times daily.  60 tablet  3  . lamiVUDine (EPIVIR) 150 MG tablet Take 1 tablet (150 mg total) by mouth daily.  30 tablet  6  . lisinopril (PRINIVIL,ZESTRIL) 20 MG tablet Take 1 tablet (20 mg total) by mouth daily.  30 tablet  3  . LORazepam (ATIVAN) 1 MG tablet Take 1 mg by mouth daily. Take one tablet for seizure prevention.      . methocarbamol (ROBAXIN) 500 MG tablet Take 500 mg by mouth every 6 (six) hours as needed for muscle spasms.      [name]  Oxycodone HCl 10 MG TABS Take 1 tablet (10 mg total) by mouth every 6 (six) hours as needed (severe pain).  12 tablet  0  . pantoprazole (PROTONIX) 40 MG tablet Take 1 tablet (40 mg total) by mouth daily.  30 tablet  11  . phenytoin (DILANTIN) 50 MG tablet Chew 4 tablets (200 mg total) by mouth 2 (two) times daily.  180 tablet  3  . ritonavir (NORVIR) 100 MG TABS tablet Take 100 mg by mouth daily with breakfast.       Scheduled: . abacavir  600 mg Oral Daily  . acetaminophen  650 mg Oral Once  . antiseptic oral rinse  15 mL Mouth Rinse BID  . aspirin EC  81 mg Oral Daily  . Chlorhexidine Gluconate Cloth  6 each Topical Q0600  . Darunavir Ethanolate  800 mg Oral Q breakfast  . furosemide  160 mg Oral BID  . heparin  5,000 Units Subcutaneous 3 times per day  .  hydrALAZINE  50 mg Oral TID  . insulin aspart  0-20 Units Subcutaneous TID WC  . insulin glargine  50 Units Subcutaneous Daily  . labetalol  200 mg Oral BID  . lamiVUDine  100 mg Oral Daily  . levETIRAcetam  1,500 mg Oral Q12H  . metoprolol  10 mg Intravenous 4 times per day  . mupirocin ointment  1 application Nasal BID  . pantoprazole  40 mg Oral Daily  . phenytoin  200 mg Oral BID  . ritonavir  100 mg Oral Q breakfast  . sodium chloride  3 mL Intravenous Q12H     ROS:                                                                                                                                       History obtained from the patient  General ROS: negative for - chills, fatigue, fever, night sweats, weight gain or weight loss Psychological ROS: negative for - behavioral disorder, hallucinations, memory difficulties, mood swings or suicidal ideation Ophthalmic ROS: negative for - blurry vision, double vision, eye pain or loss of vision ENT ROS: negative for - epistaxis, nasal discharge, oral lesions, sore throat, tinnitus or vertigo Allergy and Immunology ROS: negative for - hives or itchy/watery eyes Hematological and Lymphatic ROS: negative for - bleeding problems, bruising or swollen lymph nodes Endocrine ROS: negative for - galactorrhea, hair pattern changes, polydipsia/polyuria or temperature intolerance Respiratory ROS: negative for - cough, hemoptysis, shortness of breath or wheezing Cardiovascular ROS: negative for - chest pain, dyspnea on exertion, edema or irregular heartbeat Gastrointestinal ROS: negative for - abdominal pain, diarrhea, hematemesis, nausea/vomiting or stool incontinence Genito-Urinary ROS: negative for - dysuria, hematuria, incontinence or urinary frequency/urgency Musculoskeletal ROS: negative for - joint swelling or muscular weakness Neurological ROS: as noted in HPI Dermatological ROS: negative for rash and skin lesion changes   Blood pressure  159/108, pulse 78,  temperature 99.4 F (37.4 C), temperature source Oral, resp. rate 23, height 5' 5.5" (1.664 m), weight 85 kg (187 lb 6.3 oz), SpO2 94.00%.   Neurologic Examination:                                                                                                      Mental Status: Alert, oriented to [place] but needs some prompting to say [place]in[place] Otto is the state. Slow to answer questions.  Speech fluent without evidence of aphasia.  Able to follow 2 step commands without difficulty. Cranial Nerves: II: Discs flat bilaterally; Visual fields grossly normal, pupils equal, round, reactive to light and accommodation III,IV, VI: ptosis not present, extra-ocular motions intact bilaterally V,VII: smile symmetric, facial light touch sensation normal bilaterally VIII: hearing normal bilaterally IX,X: gag reflex present XI: bilateral shoulder shrug XII: midline tongue extension without atrophy or fasciculations  Motor: Right : Upper extremity   5/5    Left:     Upper extremity   4/5  Lower extremity   5/5     Lower extremity   5/5 Tone and bulk:normal tone throughout; no atrophy noted Sensory: Pinprick and light touch intact throughout, bilaterally Deep Tendon Reflexes:  1+ throughout with no AJ  Plantars: Right: downgoing   Left: downgoing Cerebellar: normal finger-to-nose,  normal heel-to-shin test Gait: not tested due to weakness.  CV: pulses palpable throughout    Lab Results: Basic Metabolic Panel:  Recent Labs Lab 07/19/13 1300 07/19/13 1700 07/20/13 0612 07/21/13 0330 07/22/13 0335  NA 136* 138 132* 135* 140  K 3.6* 3.8 4.1 3.4* 3.4*  CL 99 98 94* 94* 97  CO2 20 22 23  32 32  GLUCOSE 133* 118* 409* 131* 43*  BUN 116* 108* 94* 81* 66*  CREATININE 3.88* 3.63* 3.05* 2.71* 2.59*  CALCIUM 8.0* 8.0* 7.4* 7.2* 7.8*  MG  --   --   --   --  1.8  PHOS  --  4.9* 3.0  --   --     Liver Function Tests:  Recent Labs Lab 07/18/13 2238 07/19/13 0525 07/19/13 1700  07/20/13 0612 07/22/13 0335  AST 30  --   --  38*  --   ALT 38*  --   --  26  --   ALKPHOS 297*  --   --  234*  --   BILITOT 10.8* 11.7*  --  9.4* 7.0*  PROT 6.2  --   --  5.2*  --   ALBUMIN 1.8*  --  1.6* 1.3*  1.3*  --    No results found for this basename: LIPASE, AMYLASE,  in the last 168 hours No results found for this basename: AMMONIA,  in the last 168 hours  CBC:  Recent Labs Lab 07/18/13 2238  07/19/13 0525 07/20/13 0612 07/20/13 1430 07/20/13 2244 07/22/13 0335  WBC 20.7*  --  17.6* 9.8 8.6 8.2 9.2  NEUTROABS 17.8*  --   --  7.1  --   --   --   HGB 9.3*  < > 9.7* 7.1* 6.3* 8.2* 8.5*  HCT 26.8*  < > 28.2* 21.0* 18.6* 23.6* 25.2*  MCV 98.2  --  98.3 99.1 98.9 95.5 96.6  PLT 159  --  157 127* 102* 129* 167  < > = values in this interval not displayed.  Cardiac Enzymes:  Recent Labs Lab 07/19/13 0525  TROPONINI <0.30    Lipid Panel: No results found for this basename: CHOL, TRIG, HDL, CHOLHDL, VLDL, LDLCALC,  in the last 168 hours  CBG:  Recent Labs Lab 07/22/13 0455 07/22/13 0519 07/22/13 0537 07/22/13 0751 07/22/13 1208  GLUCAP 46* 69* 109* 224* 227*    Microbiology: Results for orders placed during the hospital encounter of 07/18/13  CULTURE, BLOOD (ROUTINE X 2)     Status: None   Collection Time    07/19/13 12:30 AM      Result Value Ref Range Status   Specimen Description BLOOD RIGHT HAND   Final   Special Requests BOTTLES DRAWN AEROBIC AND ANAEROBIC [facility] EACH   Final   Culture  Setup Time     Final   Value: 07/19/2013 08:47     Performed at [facility]   Culture     Final   Value: ESCHERICHIA COLI     Note: Gram Stain Report Called to,Read Back By and Verified With: Sherolyn Buba 07/19/13 AT 8:22PM THOMI     Performed at [facility]   Report Status 07/21/2013 FINAL   Final   Organism ID, Bacteria ESCHERICHIA COLI   Final  MRSA PCR SCREENING     Status: Abnormal   Collection Time    07/19/13  3:52 AM      Result  Value Ref Range Status   MRSA by PCR POSITIVE (*) NEGATIVE Final   Comment:            The GeneXpert MRSA Assay (FDA     approved for NASAL specimens     only), is one component of a     comprehensive MRSA colonization     surveillance program. It is not     intended to diagnose MRSA     infection nor to guide or     monitor treatment for     MRSA infections.     RESULT CALLED TO, READ BACK BY AND VERIFIED WITH:     C PORTER,RN 07/19/13 0827 BY K SCHULTZ  CULTURE, BLOOD (ROUTINE X 2)     Status: None   Collection Time    07/19/13 11:40 PM      Result Value Ref Range Status   Specimen Description BLOOD RIGHT ARM   Final   Special Requests BOTTLES DRAWN AEROBIC AND ANAEROBIC 5CC EACH   Final   Culture  Setup Time     Final   Value: 07/20/2013 03:34     Performed at [facility]   Culture     Final   Value:        BLOOD CULTURE RECEIVED NO GROWTH TO DATE CULTURE WILL BE HELD FOR 5 DAYS BEFORE ISSUING A FINAL NEGATIVE REPORT     Performed at [facility]   Report Status PENDING   Incomplete  CULTURE, BLOOD (ROUTINE X 2)     Status: None   Collection Time    07/19/13 11:57 PM      Result Value Ref Range Status   Specimen Description BLOOD RIGHT ARM   Final   Special Requests BOTTLES DRAWN AEROBIC AND ANAEROBIC [facility] EACH   Final   Culture  Setup Time     Final   Value: 07/20/2013 03:36     [facility]   Culture     Final   Value:        BLOOD CULTURE RECEIVED NO GROWTH TO DATE CULTURE WILL BE HELD FOR 5 DAYS BEFORE ISSUING A FINAL NEGATIVE REPORT     [facility]   Report Status PENDING   Incomplete  CLOSTRIDIUM DIFFICILE BY PCR     Status: None   Collection Time    07/20/13 11:18 AM      Result Value Ref Range Status   C difficile by pcr NEGATIVE  NEGATIVE Final    Coagulation Studies: No results found for this basename: LABPROT, INR,  in the last 72 hours  Imaging: No results found.     Assessment and plan  per attending neurologist  Felicie MornDavid Smith PA-C [facility] [phone]  07/22/2013, 1:03 PM  I have seen and evaluated the patient. I have reviewed the above note and made appropriate changes.    Assessment/Plan: 53 YO female with known seizure disorder. Prior to hospitalization and while hospitalized patient has had 3 break through seizures. Breakthrough seizure likely in the setting of flouroquinolone, however on arrival her dilantin level is sub-therapeutic and there is question (by family) of patient taking her medication as directed. Currently patient Cr clearance is <30 and recommended Keppra dose be adjusted to 500 mg BID.   I would favor trying to determine if current dose of dilantin is likely to get her theraputic prior to increasing dose in a patient who may or may not have been non-compliant.   Recommend: 1) Decrease Keppra to 500 mg BID and adjust dose according to patients creatinine clearance. 2) Give 1 mg Ativan tonight for coverage while dilantin is subtheraputic and reorder Dilantin level in AM.  If level is increasing would continue current dose of Dilantin and recheck in a couple days.   3) If further breakthrough seizures would give small dilantin load, eg 500mg .     Ritta SlotMcNeill Cullen Lahaie, MD [facility] [phone]  If 7pm- 7am, please page neurology on call at [phone].

## 2013-07-22 NOTE — Consult Note (Signed)
Vascular Surgery Consultation  Reason for Consult: Vascular access  HPI: Michelle [name] is a 53 y.o. female who presents for evaluation of vascular access. Patient has chronic renal insufficiency-not yet on hemodialysis. Has nephrotic syndrome. Was admitted with altered mental status and worsening seizures. Also has history of CVA, HIV, meningitis, and hepatitis C virus.   Past Medical History  Diagnosis Date  . Seizures   . Stroke   . Meningitis   . HIV (human immunodeficiency virus infection)   . Hypertension   . Gout   . Muscle spasms of head and/or neck   . CKD (chronic kidney disease)   . CHF (congestive heart failure)     [name] 06/18/2013  . HCV (hepatitis C virus)     chronic/notes 06/18/2013  . Type II diabetes mellitus     [name] 06/18/2013  . AIHA (autoimmune hemolytic anemia)     [name] 06/18/2013  . Hypertensive encephalopathy ~ 05/2013    hospitalaized/notes 06/18/2013  . Daily headache     "for the last 6 years/notes 06/18/2013  . Exertional shortness of breath     [name] 06/18/2013  . Anxiety     [name] 06/18/2013  . Nephrotic syndrome   . History of syphilis   . High cholesterol    Past Surgical History  Procedure Laterality Date  . Hip pinning Right    History   Social History  . Marital Status: Single    Spouse Name: N/A    Number of Children: 4  . Years of Education: 11th   Social History Main Topics  . Smoking status: Former Smoker    Types: Cigarettes    Quit date: 06/19/2010  . Smokeless tobacco: Never Used  . Alcohol Use: No  . Drug Use: No     Comment: past history of alcohol, cocaine and IV drug use  . Sexual Activity: Not Currently    Partners: Male     Comment: given condoms   Other Topics Concern  . None   Social History Narrative   Patient lives at home with sister.    Patient is unemployed.    Patient is single.    Patient has 2 alive and 2 deceased.    Patient has 11th grade education.          Family History  Problem  Relation Age of Onset  . Cancer - Colon Mother   . Cancer Father   . Diabetes    . Hypertension Father    Allergies  Allergen Reactions  . 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 Norvasc [Amlodipine Besylate]     Itching, rash , hives .   [name] Morphine And Related Hives, Itching and Rash   Prior to Admission medications   Medication Sig Start Date End Date Taking? Authorizing Provider  abacavir (ZIAGEN) 300 MG tablet Take 2 tablets (600 mg total) by mouth daily. 06/20/13  Yes Vivi Barrack, MD  acyclovir (ZOVIRAX) 800 MG tablet Take 1 tablet (800 mg total) by mouth daily. 06/02/13  Yes Vivi Barrack, MD  aspirin EC 81 MG tablet Take 1 tablet (81 mg total) by mouth daily. 06/02/13  Yes Vivi Barrack, MD  cholecalciferol (VITAMIN D) 1000 UNITS tablet Take 1,000 Units by mouth daily.   Yes Historical Provider, MD  cloNIDine (CATAPRES) 0.2 MG tablet Take 1 tablet (0.2 mg total) by mouth 2 (two) times daily. 06/02/13  Yes Vivi Barrack, MD  Darunavir Ethanolate (PREZISTA)  800 MG tablet Take 1 tablet (800 mg total) by mouth daily with breakfast. 06/20/13  Yes Vivi BarrackSarah Cater, MD  furosemide (LASIX) 80 MG tablet Take 2 tablets (160 mg total) by mouth 2 (two) times daily. 07/11/13  Yes Dow Adolphichard Kazibwe, MD  gentamicin (GARAMYCIN) 0.3 % ophthalmic ointment Place 1 application into both eyes 4 (four) times daily.   Yes Historical Provider, MD  glucose blood (ACCU-CHEK AVIVA) test strip Use as instructed 06/13/13  Yes Christen BameNora Sadek, MD  hydrALAZINE (APRESOLINE) 50 MG tablet Take 50 mg by mouth 3 (three) times daily.   Yes Historical Provider, MD  insulin aspart (NOVOLOG) 100 UNIT/ML injection Inject into the skin 3 (three) times daily before meals. Home Sliding scale   Yes Historical Provider, MD  insulin glargine (LANTUS) 100 UNIT/ML injection Inject 30 Units into the skin at bedtime.   Yes Historical Provider, MD  labetalol (NORMODYNE) 100 MG tablet Take 1 tablet (100 mg total) by  mouth 2 (two) times daily. 06/02/13  Yes Vivi BarrackSarah Cater, MD  lamiVUDine (EPIVIR) 150 MG tablet Take 1 tablet (150 mg total) by mouth daily. 06/20/13  Yes Vivi BarrackSarah Cater, MD  lisinopril (PRINIVIL,ZESTRIL) 20 MG tablet Take 1 tablet (20 mg total) by mouth daily. 07/11/13  Yes Dow Adolphichard Kazibwe, MD  LORazepam (ATIVAN) 1 MG tablet Take 1 mg by mouth daily. Take one tablet for seizure prevention.   Yes Historical Provider, MD  methocarbamol (ROBAXIN) 500 MG tablet Take 500 mg by mouth every 6 (six) hours as needed for muscle spasms.   Yes Historical Provider, MD  Oxycodone HCl 10 MG TABS Take 1 tablet (10 mg total) by mouth every 6 (six) hours as needed (severe pain). 07/02/13  Yes Kaitlyn Szekalski, PA-C  pantoprazole (PROTONIX) 40 MG tablet Take 1 tablet (40 mg total) by mouth daily. 06/02/13  Yes Vivi BarrackSarah Cater, MD  phenytoin (DILANTIN) 50 MG tablet Chew 4 tablets (200 mg total) by mouth 2 (two) times daily. 07/11/13  Yes Dow Adolphichard Kazibwe, MD  ritonavir (NORVIR) 100 MG TABS tablet Take 100 mg by mouth daily with breakfast.   Yes Historical Provider, MD     Positive ROS: History not reliable from patient  All other systems have been reviewed and were otherwise negative with the exception of those mentioned in the HPI and as above.  Physical Exam: Filed Vitals:   07/22/13 1415  BP: 172/80  Pulse:   Temp:   Resp:     General: Alert, no acute distress-does not answer all questions appropriately  HEENT: Normal for age Cardiovascular: Regular rate and rhythm. Carotid pulses 2+, no bruits audible Respiratory: Clear to auscultation. No cyanosis, no use of accessory musculature GI: No organomegaly, abdomen is soft and non-tender Skin: No lesions in the area of chief complaint Neurologic: Sensation intact distally Psychiatric: Patient is competent for consent with normal mood and affect Musculoskeletal: No obvious deformities Extremities: 3+ brachial and radial pulses palpable bilaterally. Lower extremity 3+  femoral and dorsalis pedis pulses palpable.   Imaging reviewed: Vein mapping depending   Assessment/Plan:  Patient with chronic renal insufficiency-not on hemodialysis, admitted with altered mental status and seizure disorder problems Will need evaluation of her AV fistula when stable Vein mapping pending-we'll follow with you and decide about timing for access   Michelle GipJames Sanaz Scarlett, MD 07/22/2013 3:26 PM

## 2013-07-22 NOTE — Progress Notes (Signed)
INTERNAL MEDICINE TEACHING SERVICE DAILY PROGRESS NOTE Subjective: Overnight team was called around 11pm last night due to sudden onset of Right arm and leg shaking as well as mild confusion.  She was given 2mg  of ativan. She also had 2 episodes of hypoglycemia overnight but was not hypoglycemic at the time of possible seizure activity. Today she reports no recollection of the episodes.  She reports feeling overall well today and reports she is eating well.  She has no acute complaints. Objective: Vital signs in last 24 hours: Filed Vitals:   07/21/13 1957 07/22/13 0012 07/22/13 0352 07/22/13 0800  BP: 170/76 160/81 160/67 159/108  Pulse: 83 103 84 78  Temp: 98.1 F (36.7 C) 102.9 F (39.4 C) 97.9 F (36.6 C)   TempSrc: Oral Oral Axillary   Resp: 26 25 24 23   Height:      Weight:   187 lb 6.3 oz (85 kg)   SpO2: 98%  98% 94%   Weight change:   Intake/Output Summary (Last 24 hours) at 07/22/13 0930 Last data filed at 07/21/13 2220  Gross per 24 hour  Intake    123 ml  Output    250 ml  Net   -127 ml    General: Vital signs reviewed. Resting comfortably in bed, NAD Lungs: Clear Bilateral Heart: Regular; no extra sounds or murmurs  Abdomen: Bowel sounds present, soft, nontender; no hepatosplenomegaly  Extremities: 1+ lower extremity edema to knee bilaterally Neurologic: AAOx3  Lab Results: Basic Metabolic Panel:  Recent Labs Lab 07/19/13 1700 07/20/13 0612 07/21/13 0330 07/22/13 0335  NA 138 132* 135* 140  K 3.8 4.1 3.4* 3.4*  CL 98 94* 94* 97  CO2 22 23 32 32  GLUCOSE 118* 409* 131* 43*  BUN 108* 94* 81* 66*  CREATININE 3.63* 3.05* 2.71* 2.59*  CALCIUM 8.0* 7.4* 7.2* 7.8*  MG  --   --   --  1.8  PHOS 4.9* 3.0  --   --    Liver Function Tests:  Recent Labs Lab 07/18/13 2238  07/19/13 1700 07/20/13 0612 07/22/13 0335  AST 30  --   --  38*  --   ALT 38*  --   --  26  --   ALKPHOS 297*  --   --  234*  --   BILITOT 10.8*  < >  --  9.4* 7.0*  PROT 6.2  --    --  5.2*  --   ALBUMIN 1.8*  --  1.6* 1.3*  1.3*  --   < > = values in this interval not displayed. No results found for this basename: LIPASE, AMYLASE,  in the last 168 hours No results found for this basename: AMMONIA,  in the last 168 hours CBC:  Recent Labs Lab 07/18/13 2238  07/20/13 0612  07/20/13 2244 07/22/13 0335  WBC 20.7*  < > 9.8  < > 8.2 9.2  NEUTROABS 17.8*  --  7.1  --   --   --   HGB 9.3*  < > 7.1*  < > 8.2* 8.5*  HCT 26.8*  < > 21.0*  < > 23.6* 25.2*  MCV 98.2  < > 99.1  < > 95.5 96.6  PLT 159  < > 127*  < > 129* 167  < > = values in this interval not displayed. Cardiac Enzymes:  Recent Labs Lab 07/19/13 0525  TROPONINI <0.30   CBG:  Recent Labs Lab 07/21/13 1759 07/21/13 2155 07/22/13 0455 07/22/13 0519 07/22/13  0537 07/22/13 0751  GLUCAP 101* 141* 46* 69* 109* 224*   Anemia Panel:  Recent Labs Lab 07/19/13 0525  RETICCTPCT 1.7   Urine Drug Screen: Drugs of Abuse    Urinalysis:  Recent Labs Lab 07/19/13 0400  COLORURINE ORANGE*  LABSPEC 1.021  PHURINE 5.0  GLUCOSEU 250*  HGBUR TRACE*  BILIRUBINUR LARGE*  KETONESUR NEGATIVE  PROTEINUR >300*  UROBILINOGEN 2.0*  NITRITE NEGATIVE  LEUKOCYTESUR SMALL*   Micro Results: Recent Results (from the past 240 hour(s))  CULTURE, BLOOD (ROUTINE X 2)     Status: None   Collection Time    07/19/13 12:30 AM      Result Value Ref Range Status   Specimen Description BLOOD RIGHT HAND   Final   Special Requests BOTTLES DRAWN AEROBIC AND ANAEROBIC [facility] EACH   Final   Culture  Setup Time     Final   Value: 07/19/2013 08:47     Performed at [facility]   Culture     Final   Value: ESCHERICHIA COLI     Note: Gram Stain Report Called to,Read Back By and Verified With: Sherolyn Buba 07/19/13 AT 8:22PM THOMI     Performed at [facility]   Report Status 07/21/2013 FINAL   Final   Organism ID, Bacteria ESCHERICHIA COLI   Final  MRSA PCR SCREENING     Status: Abnormal    Collection Time    07/19/13  3:52 AM      Result Value Ref Range Status   MRSA by PCR POSITIVE (*) NEGATIVE Final   Comment:            The GeneXpert MRSA Assay (FDA     approved for NASAL specimens     only), is one component of a     comprehensive MRSA colonization     surveillance program. It is not     intended to diagnose MRSA     infection nor to guide or     monitor treatment for     MRSA infections.     RESULT CALLED TO, READ BACK BY AND VERIFIED WITH:     C PORTER,RN 07/19/13 0827 BY K SCHULTZ  CULTURE, BLOOD (ROUTINE X 2)     Status: None   Collection Time    07/19/13 11:40 PM      Result Value Ref Range Status   Specimen Description BLOOD RIGHT ARM   Final   Special Requests BOTTLES DRAWN AEROBIC AND ANAEROBIC 5CC EACH   Final   Culture  Setup Time     Final   Value: 07/20/2013 03:34     Performed at [facility]   Culture     Final   Value:        BLOOD CULTURE RECEIVED NO GROWTH TO DATE CULTURE WILL BE HELD FOR 5 DAYS BEFORE ISSUING A FINAL NEGATIVE REPORT     Performed at [facility]   Report Status PENDING   Incomplete  CULTURE, BLOOD (ROUTINE X 2)     Status: None   Collection Time    07/19/13 11:57 PM      Result Value Ref Range Status   Specimen Description BLOOD RIGHT ARM   Final   Special Requests BOTTLES DRAWN AEROBIC AND ANAEROBIC [facility] EACH   Final   Culture  Setup Time     Final   Value: 07/20/2013 03:36     Performed at [facility]  Final   Value:        BLOOD CULTURE RECEIVED NO GROWTH TO DATE CULTURE WILL BE HELD FOR 5 DAYS BEFORE ISSUING A FINAL NEGATIVE REPORT     Performed at [facility]   Report Status PENDING   Incomplete  CLOSTRIDIUM DIFFICILE BY PCR     Status: None   Collection Time    07/20/13 11:18 AM      Result Value Ref Range Status   C difficile by pcr NEGATIVE  NEGATIVE Final   Studies/Results: No results found. Medications: I have reviewed the patient's current  medications. Scheduled Meds: . abacavir  600 mg Oral Daily  . acetaminophen  650 mg Oral Once  . antiseptic oral rinse  15 mL Mouth Rinse BID  . aspirin EC  81 mg Oral Daily  . Chlorhexidine Gluconate Cloth  6 each Topical Q0600  . cloNIDine  0.2 mg Oral BID  . Darunavir Ethanolate  800 mg Oral Q breakfast  . heparin  5,000 Units Subcutaneous 3 times per day  . insulin aspart  0-20 Units Subcutaneous TID WC  . insulin glargine  50 Units Subcutaneous Daily  . lamiVUDine  100 mg Oral Daily  . levETIRAcetam  1,500 mg Oral Q12H  . levofloxacin (LEVAQUIN) IV  500 mg Intravenous Q48H  . LORazepam      . metoprolol  10 mg Intravenous 4 times per day  . mupirocin ointment  1 application Nasal BID  . pantoprazole  40 mg Oral Daily  . phenytoin  200 mg Oral BID  . ritonavir  100 mg Oral Q breakfast  . sodium chloride  3 mL Intravenous Q12H   Continuous Infusions:   PRN Meds:.acetaminophen, LORazepam, ondansetron (ZOFRAN) IV, ondansetron, oxyCODONE Assessment/Plan: Michelle [name] is a 53 year old woman with past medical history of dCHF (EF normal, with Grade-2 diastolic dysfunction), HIV (CD4 490 and VL<20 on 05/07/13), chronic HCV, seizure disorder, hx of stroke, HTN, insulin dependent Type II DM, CKD, gout, and AIHA who presents with altered mental status.   # Sepsis E coli Bacteremia versus Pneumonia: Blood culture 1/2 on 07/19/2013 revealed E coli. Possible source of infection is GI versus urinary tract. However, Urinalysis 2/20 negative for nitrites. She continues to be febrile, tachycardic and tachypnenic but BP is stable. Abdomen ultrasound scan, remarkable for sludge, but without features of cholecystitis. Chest x-Stenseth to 20/15 with the right medial basilar airspace opacity, concerning for pneumonia. E Coli sensitivity to Cipro. Resistant to Ampicillin, Bactrim Plan  -  D/C Levaquin (lowers seizure thershold) - Start Cefazolin - follow repeat BCx (NGTD)  # Seizure disorder - Episode last  night concerning for repeat seizure   Dilantin level this AM was 2.9 with last albumin 1.4 if creatinine clearance is >20 will correct to 8.1 if less than <20 will correct to 12.6.  Her current estimated GFR is 22. -continue keppra, dilantin  - Consult neurology for further recommendation as patient was potentially therapeutic on dilantin.  # Acute Kidney Injury on CKD - Improving. Biopsy showed collapsing FSGS. HD catheter inserted 07/19/2013. Patient Creatine almost at baseline. -Renal assisting in her care - Will need Vein mapping and permanent access placed.  Will consult vascular surgery today- Dr. Hart Rochester (Spoke with Dr. Hyman Hopes, would like to have access obtained during this admission) -daily weights, I/O's (Net +5L) -resume home lasix - Can resume home lisinopril if needed   # Autoimmune Hemolytic Anemia - ? Related to ceftriaxone. LDH elevated, bilirubin elevated (however direct and  not indirect), hemoglobin stable from discharge. Low sucipicion for active hemolysis  - monitor CBC - hold off steroids for now in the setting of active infection - Hemoglobin trending up to 8.5 today  # Diabetes: hypoglycemic overnight - Reduce Lantus to 50 units  - D/C Novolog 8 units TIDWC -SSI-R - Carb mod diet   # Hypertension : Hypertensive overnight On home clonidine, lasix, labetalol, lisinopril, hydralazine. Initially BP was low but currently elevated.  -clonidine 0.2 mg bid - Labetalol 100mg  BID back  - Restart hydralazine 50mg  TID  # HIV - last CD4 = 490 (05/07/13)  -continue abacavir, prezista, lamivudine (renal adjust to 100mg ), norvir  # Increased Anion Gap Metabolic Acidosis resolved:  Secondary to uremia, associated with acute on chronic renal insufficiency.   # Diarrhea: improving - C diff neg.  Dispo: Disposition is deferred at this time, awaiting improvement of current medical problems.  Can consider transfer out of step down after neurology evaluation.  The patient does have a  current PCP Burtis Junes, Arna Medici, MD), therefore will be requiring OPC follow-up after discharge.   The patient does not have transportation limitations that hinder transportation to clinic appointments.  .Services Needed at time of discharge: Y = Yes, Blank = No PT:   OT:   RN:   Equipment:   Other:     LOS: 4 days   Gust Rung PGY 1 - Internal Medicine Teaching Service Pager: [phone]  07/22/2013, 9:30 AM

## 2013-07-22 NOTE — Progress Notes (Signed)
Thank you for consult on Michelle [name]--chart was reviewed. Recommend PT/OT consults to evaluate patient's impairement and help determine appropriate rehab needs. Will follow along and complete evaluation once therapy evaluations complete.   Agree with above  Ranelle Oyster, MD, [facility] Physical Medicine & Rehabilitation

## 2013-07-22 NOTE — Significant Event (Signed)
CRITICAL VALUE ALERT  Critical value received: glucose 43  Date of notification:  07/22/13  Time of notification: 0453  Critical value read back  yes  Nurse who received alert: Jeanmarie Plant RN  MD notified (1st page): IMTS  Time of first page: [phone]  Responding MD: Dr. Johna Roles Time MD responded:  [phone]

## 2013-07-22 NOTE — Progress Notes (Addendum)
MEDICATION RELATED CONSULT NOTE - INITIAL   Pharmacy Consult for dilantin Indication: seizure  Allergies  Allergen Reactions  . 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 Norvasc [Amlodipine Besylate]     Itching, rash , hives .   [name] Morphine And Related Hives, Itching and Rash    Patient Measurements: Height: 5' 5.5" (166.4 cm) Weight: 187 lb 6.3 oz (85 kg) IBW/kg (Calculated) : 58.15   Vital Signs: Temp: 99.4 F (37.4 C) (02/23 1100) Temp src: Oral (02/23 1100) BP: 159/108 mmHg (02/23 0800) Pulse Rate: 78 (02/23 0800) Intake/Output from previous day: 02/22 0701 - 02/23 0700 In: 932.2 [P.O.:600; I.V.:332.2] Out: 1200 [Urine:1200] Intake/Output from this shift:    Labs:  Recent Labs  07/19/13 1700  07/20/13 0612 07/20/13 1430 07/20/13 2244 07/21/13 0330 07/22/13 0335  WBC  --   < > 9.8 8.6 8.2  --  9.2  HGB  --   < > 7.1* 6.3* 8.2*  --  8.5*  HCT  --   < > 21.0* 18.6* 23.6*  --  25.2*  PLT  --   < > 127* 102* 129*  --  167  CREATININE 3.63*  --  3.05*  --   --  2.71* 2.59*  MG  --   --   --   --   --   --  1.8  PHOS 4.9*  --  3.0  --   --   --   --   ALBUMIN 1.6*  --  1.3*  1.3*  --   --   --   --   PROT  --   --  5.2*  --   --   --   --   AST  --   --  38*  --   --   --   --   ALT  --   --  26  --   --   --   --   ALKPHOS  --   --  234*  --   --   --   --   BILITOT  --   --  9.4*  --   --   --  7.0*  BILIDIR  --   --  7.8*  --   --   --  5.7*  IBILI  --   --  1.6*  --   --   --  1.3*  < > = values in this interval not displayed. Estimated Creatinine Clearance: 27 ml/min (by C-G formula based on Cr of 2.59).   Microbiology: Recent Results (from the past 720 hour(s))  URINE CULTURE     Status: None   Collection Time    07/02/13  5:12 PM      Result Value Ref Range Status   Specimen Description URINE, CLEAN CATCH   Final   Special Requests Immunocompromised   Final   Culture  Setup Time     Final   Value:  07/02/2013 19:28     Performed at [facility] Count     Final   Value: 40,000 COLONIES/ML     Performed at [facility]   Culture     Final   Value: Multiple bacterial morphotypes present, none predominant. Suggest appropriate recollection if clinically indicated.     Performed at [facility]   Report Status 07/03/2013 FINAL   Final  URINE CULTURE  Status: None   Collection Time    07/05/13 11:52 PM      Result Value Ref Range Status   Specimen Description URINE, RANDOM   Final   Special Requests Immunocompromised   Final   Culture  Setup Time     Final   Value: 07/06/2013 06:13     Performed at [facility]   Colony Count     Final   Value: 45,000 COLONIES/ML     Performed at [facility]   Culture     Final   Value: Multiple bacterial morphotypes present, none predominant. Suggest appropriate recollection if clinically indicated.     Performed at [facility]   Report Status 07/07/2013 FINAL   Final  CULTURE, BLOOD (ROUTINE X 2)     Status: None   Collection Time    07/19/13 12:30 AM      Result Value Ref Range Status   Specimen Description BLOOD RIGHT HAND   Final   Special Requests BOTTLES DRAWN AEROBIC AND ANAEROBIC [facility] EACH   Final   Culture  Setup Time     Final   Value: 07/19/2013 08:47     Performed at [facility]   Culture     Final   Value: ESCHERICHIA COLI     Note: Gram Stain Report Called to,Read Back By and Verified With: Sherolyn Buba 07/19/13 AT 8:22PM THOMI     Performed at [facility]   Report Status 07/21/2013 FINAL   Final   Organism ID, Bacteria ESCHERICHIA COLI   Final  MRSA PCR SCREENING     Status: Abnormal   Collection Time    07/19/13  3:52 AM      Result Value Ref Range Status   MRSA by PCR POSITIVE (*) NEGATIVE Final   Comment:            The GeneXpert MRSA Assay (FDA     approved for NASAL specimens     only), is one component of a     comprehensive MRSA  colonization     surveillance program. It is not     intended to diagnose MRSA     infection nor to guide or     monitor treatment for     MRSA infections.     RESULT CALLED TO, READ BACK BY AND VERIFIED WITH:     C PORTER,RN 07/19/13 0827 BY K SCHULTZ  CULTURE, BLOOD (ROUTINE X 2)     Status: None   Collection Time    07/19/13 11:40 PM      Result Value Ref Range Status   Specimen Description BLOOD RIGHT ARM   Final   Special Requests BOTTLES DRAWN AEROBIC AND ANAEROBIC 5CC EACH   Final   Culture  Setup Time     Final   Value: 07/20/2013 03:34     Performed at [facility]   Culture     Final   Value:        BLOOD CULTURE RECEIVED NO GROWTH TO DATE CULTURE WILL BE HELD FOR 5 DAYS BEFORE ISSUING A FINAL NEGATIVE REPORT     Performed at [facility]   Report Status PENDING   Incomplete  CULTURE, BLOOD (ROUTINE X 2)     Status: None   Collection Time    07/19/13 11:57 PM      Result Value Ref Range Status   Specimen Description BLOOD RIGHT ARM   Final   Special  Requests BOTTLES DRAWN AEROBIC AND ANAEROBIC [facility] EACH   Final   Culture  Setup Time     Final   Value: 07/20/2013 03:36     [facility]   Culture     Final   Value:        BLOOD CULTURE RECEIVED NO GROWTH TO DATE CULTURE WILL BE HELD FOR 5 DAYS BEFORE ISSUING A FINAL NEGATIVE REPORT     [facility]   Report Status PENDING   Incomplete  CLOSTRIDIUM DIFFICILE BY PCR     Status: None   Collection Time    07/20/13 11:18 AM      Result Value Ref Range Status   C difficile by pcr NEGATIVE  NEGATIVE Final    Medical History: Past Medical History  Diagnosis Date  . Seizures   . Stroke   . Meningitis   . HIV (human immunodeficiency virus infection)   . Hypertension   . Gout   . Muscle spasms of head and/or neck   . CKD (chronic kidney disease)   . CHF (congestive heart failure)     Michelle [name]/notes 06/18/2013  . HCV (hepatitis C virus)     chronic/notes 06/18/2013  .  Type II diabetes mellitus     Michelle [name]/notes 06/18/2013  . AIHA (autoimmune hemolytic anemia)     Michelle [name]/notes 06/18/2013  . Hypertensive encephalopathy ~ 05/2013    hospitalaized/notes 06/18/2013  . Daily headache     "for the last 6 years/notes 06/18/2013  . Exertional shortness of breath     Michelle [name]/notes 06/18/2013  . Anxiety     Michelle [name]/notes 06/18/2013  . Nephrotic syndrome   . History of syphilis   . High cholesterol     Medications:  Scheduled:  . abacavir  600 mg Oral Daily  . acetaminophen  650 mg Oral Once  . antiseptic oral rinse  15 mL Mouth Rinse BID  . aspirin EC  81 mg Oral Daily  . Chlorhexidine Gluconate Cloth  6 each Topical Q0600  . Darunavir Ethanolate  800 mg Oral Q breakfast  . furosemide  160 mg Oral BID  . heparin  5,000 Units Subcutaneous 3 times per day  . hydrALAZINE  50 mg Oral TID  . insulin aspart  0-20 Units Subcutaneous TID WC  . insulin glargine  50 Units Subcutaneous Daily  . labetalol  200 mg Oral BID  . lamiVUDine  100 mg Oral Daily  . levETIRAcetam  500 mg Oral Q12H  . metoprolol  10 mg Intravenous 4 times per day  . mupirocin ointment  1 application Nasal BID  . pantoprazole  40 mg Oral Daily  . phenytoin (DILANTIN) IV  200 mg Intravenous Once  . phenytoin  200 mg Oral BID  . ritonavir  100 mg Oral Q breakfast  . sodium chloride  3 mL Intravenous Q12H    Assessment: 53 yo lady on dilantin 200 mg po bid and keppra 500 mg bid who had seizure like activity last night.  Her dilantin level this am was 2.9.  Her albumin is 1.3 and SrCr 2.59 with est CrCL ~ 25 ml/min.  Corrected level 8.1 mg/L.    Goal of Therapy:  Free dilantin level 1-2, corrected level 10-20 mcg/ml  Plan:  Will give 200 mg IV dilantin bolus to achieve therapeutic level. Will change po dilantin to 200 mg IV q12 Recheck level in am to assure therapeutic after IV dose. Recheck level in 5-7 days.  Consider free level for more accurate therapeutic level with low albumin and decreased renal  function.  Thanks for allowing pharmacy to be a part of this patient's care.  Talbert Cage, PharmD Clinical Pharmacist, [phone] 07/22/2013,1:46 PM

## 2013-07-22 NOTE — Progress Notes (Signed)
ANTIBIOTIC CONSULT NOTE - FOLLOW UP  Pharmacy Consult for Cefazolin  Indication: r/o HCAP  Allergies  Allergen Reactions  . 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 Norvasc [Amlodipine Besylate]     Itching, rash , hives .   [name] Morphine And Related Hives, Itching and Rash   Patient Measurements: Height: 5\' 5"  (165.1 cm) Weight: 191 lb 2.2 oz (86.7 kg) IBW/kg (Calculated) : 57  Vital Signs: Temp: 99.8 F (37.7 C) (02/23 1733) Temp src: Oral (02/23 1733) BP: 121/64 mmHg (02/23 1733) Pulse Rate: 92 (02/23 1733) Labs:  Recent Labs  07/20/13 0612 07/20/13 1430 07/20/13 2244 07/21/13 0330 07/22/13 0335  WBC 9.8 8.6 8.2  --  9.2  HGB 7.1* 6.3* 8.2*  --  8.5*  PLT 127* 102* 129*  --  167  CREATININE 3.05*  --   --  2.71* 2.59*    Microbiology: Recent Results (from the past 720 hour(s))  URINE CULTURE     Status: None   Collection Time    07/02/13  5:12 PM      Result Value Ref Range Status   Specimen Description URINE, CLEAN CATCH   Final   Special Requests Immunocompromised   Final   Culture  Setup Time     Final   Value: 07/02/2013 19:28     Performed at [facility] Count     Final   Value: 40,000 COLONIES/ML     Performed at [facility]   Culture     Final   Value: Multiple bacterial morphotypes present, none predominant. Suggest appropriate recollection if clinically indicated.     Performed at [facility]   Report Status 07/03/2013 FINAL   Final  URINE CULTURE     Status: None   Collection Time    07/05/13 11:52 PM      Result Value Ref Range Status   Specimen Description URINE, RANDOM   Final   Special Requests Immunocompromised   Final   Culture  Setup Time     Final   Value: 07/06/2013 06:13     Performed at [facility]   Colony Count     Final   Value: 45,000 COLONIES/ML     Performed at [facility]   Culture     Final   Value: Multiple bacterial  morphotypes present, none predominant. Suggest appropriate recollection if clinically indicated.     Performed at [facility]   Report Status 07/07/2013 FINAL   Final  CULTURE, BLOOD (ROUTINE X 2)     Status: None   Collection Time    07/19/13 12:30 AM      Result Value Ref Range Status   Specimen Description BLOOD RIGHT HAND   Final   Special Requests BOTTLES DRAWN AEROBIC AND ANAEROBIC [facility] EACH   Final   Culture  Setup Time     Final   Value: 07/19/2013 08:47     Performed at [facility]   Culture     Final   Value: ESCHERICHIA COLI     Note: Gram Stain Report Called to,Read Back By and Verified With: Sherolyn Buba 07/19/13 AT 8:22PM THOMI     Performed at [facility]   Report Status 07/21/2013 FINAL   Final   Organism ID, Bacteria ESCHERICHIA COLI   Final  MRSA PCR SCREENING     Status: Abnormal   Collection Time  07/19/13  3:52 AM      Result Value Ref Range Status   MRSA by PCR POSITIVE (*) NEGATIVE Final   Comment:            The GeneXpert MRSA Assay (FDA     approved for NASAL specimens     only), is one component of a     comprehensive MRSA colonization     surveillance program. It is not     intended to diagnose MRSA     infection nor to guide or     monitor treatment for     MRSA infections.     RESULT CALLED TO, READ BACK BY AND VERIFIED WITH:     C PORTER,RN 07/19/13 0827 BY K SCHULTZ  CULTURE, BLOOD (ROUTINE X 2)     Status: None   Collection Time    07/19/13 11:40 PM      Result Value Ref Range Status   Specimen Description BLOOD RIGHT ARM   Final   Special Requests BOTTLES DRAWN AEROBIC AND ANAEROBIC 5CC EACH   Final   Culture  Setup Time     Final   Value: 07/20/2013 03:34     [facility]   Culture     Final   Value:        BLOOD CULTURE RECEIVED NO GROWTH TO DATE CULTURE WILL BE HELD FOR 5 DAYS BEFORE ISSUING A FINAL NEGATIVE REPORT     [facility]   Report Status PENDING    Incomplete  CULTURE, BLOOD (ROUTINE X 2)     Status: None   Collection Time    07/19/13 11:57 PM      Result Value Ref Range Status   Specimen Description BLOOD RIGHT ARM   Final   Special Requests BOTTLES DRAWN AEROBIC AND ANAEROBIC 5CC EACH   Final   Culture  Setup Time     Final   Value: 07/20/2013 03:36     [facility]   Culture     Final   Value:        BLOOD CULTURE RECEIVED NO GROWTH TO DATE CULTURE WILL BE HELD FOR 5 DAYS BEFORE ISSUING A FINAL NEGATIVE REPORT     [facility]   Report Status PENDING   Incomplete  CLOSTRIDIUM DIFFICILE BY PCR     Status: None   Collection Time    07/20/13 11:18 AM      Result Value Ref Range Status   C difficile by pcr NEGATIVE  NEGATIVE Final    Medical History: Past Medical History  Diagnosis Date  . Seizures   . Stroke   . Meningitis   . HIV (human immunodeficiency virus infection)   . Hypertension   . Gout   . Muscle spasms of head and/or neck   . CKD (chronic kidney disease)   . CHF (congestive heart failure)     [name]/notes 06/18/2013  . HCV (hepatitis C virus)     chronic/notes 06/18/2013  . Type II diabetes mellitus     [name]/notes 06/18/2013  . AIHA (autoimmune hemolytic anemia)     [name]/notes 06/18/2013  . Hypertensive encephalopathy ~ 05/2013    hospitalaized/notes 06/18/2013  . Daily headache     "for the last 6 years/notes 06/18/2013  . Exertional shortness of breath     [name]/notes 06/18/2013  . Anxiety     [name]/notes 06/18/2013  . Nephrotic syndrome   .  History of syphilis   . High cholesterol    Assessment: 53 y/o F with recent admission (for nephrotic syndrome) back with possible PNA on CXR.  She has been receiving IV vancomycin and aztreonam.  We have been asked to start Cefazolin for her.  She has temps to 102.9 but is now afebrile.  WBC is WNL.  She has renal insufficiency with a creatinine of 3.0 and an estimated crcl of 27 ml/min.   She has one blood culture that has grown Ecoli which is  sensitive to cefazolin.  Goal of Therapy:  Therapeutic response to IV antibiotics  Plan:  - Will start IV cefazolin at 1gm every 12 hours -Trend WBC, temp, renal function, cultures  - Transition to oral antibiotics when/if appropriate for completion of treatment duration  Nadara Mustard, PharmD., MS Clinical Pharmacist Pager:  [phone] Thank you for allowing pharmacy to be part of this patients care team. 07/22/2013 7:30 PM

## 2013-07-22 NOTE — Progress Notes (Signed)
Pt transferred to 3E23 via bed with RN & NT; all patient belongings sent with pt including cell phone and charger; family was at bedside and notified of family; report called to [facility] RN and all questions answered

## 2013-07-23 DIAGNOSIS — Z01810 Encounter for preprocedural cardiovascular examination: Secondary | ICD-10-CM

## 2013-07-23 DIAGNOSIS — A419 Sepsis, unspecified organism: Secondary | ICD-10-CM

## 2013-07-23 DIAGNOSIS — A4151 Sepsis due to Escherichia coli [E. coli]: Principal | ICD-10-CM

## 2013-07-23 DIAGNOSIS — R4182 Altered mental status, unspecified: Secondary | ICD-10-CM

## 2013-07-23 DIAGNOSIS — I129 Hypertensive chronic kidney disease with stage 1 through stage 4 chronic kidney disease, or unspecified chronic kidney disease: Secondary | ICD-10-CM

## 2013-07-23 LAB — CBC
HCT: 21.3 % — ABNORMAL LOW (ref 36.0–46.0)
HEMATOCRIT: 20.7 % — AB (ref 36.0–46.0)
HEMOGLOBIN: 7.2 g/dL — AB (ref 12.0–15.0)
Hemoglobin: 7 g/dL — ABNORMAL LOW (ref 12.0–15.0)
MCH: 33.2 pg (ref 26.0–34.0)
MCH: 33 pg (ref 26.0–34.0)
MCHC: 33.8 g/dL (ref 30.0–36.0)
MCHC: 33.8 g/dL (ref 30.0–36.0)
MCV: 98.2 fL (ref 78.0–100.0)
MCV: 97.6 fL (ref 78.0–100.0)
Platelets: 144 10*3/uL — ABNORMAL LOW (ref 150–400)
Platelets: 171 10*3/uL (ref 150–400)
RBC: 2.17 MIL/uL — AB (ref 3.87–5.11)
RBC: 2.12 MIL/uL — ABNORMAL LOW (ref 3.87–5.11)
RDW: 19.1 % — ABNORMAL HIGH (ref 11.5–15.5)
RDW: 19 % — AB (ref 11.5–15.5)
WBC: 7.5 10*3/uL (ref 4.0–10.5)
WBC: 7.5 10*3/uL (ref 4.0–10.5)

## 2013-07-23 LAB — BASIC METABOLIC PANEL
BUN: 56 mg/dL — ABNORMAL HIGH (ref 6–23)
CALCIUM: 7.5 mg/dL — AB (ref 8.4–10.5)
CHLORIDE: 98 meq/L (ref 96–112)
CO2: 28 meq/L (ref 19–32)
Creatinine, Ser: 2.43 mg/dL — ABNORMAL HIGH (ref 0.50–1.10)
GFR calc Af Amer: 25 mL/min — ABNORMAL LOW (ref >90)
GFR calc non Af Amer: 21 mL/min — ABNORMAL LOW (ref >90)
Glucose, Bld: 130 mg/dL — ABNORMAL HIGH (ref 70–99)
Potassium: 3.7 mEq/L (ref 3.7–5.3)
SODIUM: 140 meq/L (ref 137–147)

## 2013-07-23 LAB — GLUCOSE, CAPILLARY
Glucose-Capillary: 137 mg/dL — ABNORMAL HIGH (ref 70–99)
Glucose-Capillary: 165 mg/dL — ABNORMAL HIGH (ref 70–99)
Glucose-Capillary: 205 mg/dL — ABNORMAL HIGH (ref 70–99)
Glucose-Capillary: 109 mg/dL — ABNORMAL HIGH (ref 70–99)

## 2013-07-23 LAB — ALBUMIN: ALBUMIN: 1.3 g/dL — AB (ref 3.5–5.2)

## 2013-07-23 LAB — PHENYTOIN LEVEL, TOTAL: PHENYTOIN LVL: 4 ug/mL — AB (ref 10.0–20.0)

## 2013-07-23 MED ORDER — SODIUM CHLORIDE 0.9 % IV SOLN
230.0000 mg | Freq: Two times a day (BID) | INTRAVENOUS | Status: DC
  Administered 2013-07-24 – 2013-07-25 (×3): 230 mg via INTRAVENOUS
  Filled 2013-07-23 (×8): qty 4.6

## 2013-07-23 MED ORDER — CEFAZOLIN SODIUM 1-5 GM-% IV SOLN
1.0000 g | Freq: Two times a day (BID) | INTRAVENOUS | Status: DC
  Administered 2013-07-23 – 2013-07-25 (×5): 1 g via INTRAVENOUS
  Filled 2013-07-23 (×6): qty 50

## 2013-07-23 MED ORDER — SODIUM CHLORIDE 0.9 % IV SOLN
300.0000 mg | Freq: Once | INTRAVENOUS | Status: AC
  Administered 2013-07-23: 300 mg via INTRAVENOUS
  Filled 2013-07-23: qty 6

## 2013-07-23 NOTE — Progress Notes (Signed)
UR completed Jaquel Coomer K. Marcena Dias, RN, BSN, MSHL, CCM  07/23/2013 3:01 PM

## 2013-07-23 NOTE — Progress Notes (Signed)
Pt a/o, c/o nausea, PRN Zofran given, pt ate all her lunch, pt stable

## 2013-07-23 NOTE — Progress Notes (Signed)
VASCULAR LAB PRELIMINARY  PRELIMINARY  PRELIMINARY  PRELIMINARY  Right  Upper Extremity Vein Map    Cephalic  Segment Diameter Comment  1. Axilla 3.53mm   2. Mid upper arm 2.12mm   3. Above AC 2.58mm   4. In AC 2.22mm   5. Below AC mm Not found  6. Mid forearm mm Not found  7. Wrist mm Not found   Basilic  Segment Diameter Depth Comment  2. Mid upper arm 4.81mm 1.64mm Enters brachial  3. Above AC 3.9mm 1.70mm Branches  4. In AC 4.15mm 1.86mm   5. Below AC mm mm Not found  6. Mid forearm mm mm Not found  7. Wrist mm mm Not found   Difficult to image the right mid forearm to wrist due to IV    Left Upper Extremity Vein Map    Cephalic  Segment Diameter Comment  1. Axilla mm   2. Mid upper arm mm Not found  3. Above AC mm Not found  4. In AC mm Not found  5. Below AC 1.22mm Walls thickened  6. Mid forearm 1.41mm   7. Wrist 1.35mm    Basilic  Segment Diameter Depth Comment  2. Mid upper arm 2.11mm 1.35mm Enters brachial  3. Above AC 3.62mm 1.40mm   4. In [facility] 3.25mm 10.44mm   5. Below AC mm mm Unable to measure Multiple branches difficult to evaluate native vessel  6. Mid forearm mm mm Not found  7. Wrist mm mm Not found   Left IV in the hand  Alayssa Flinchum, RVT 07/23/2013, 3:30 PM

## 2013-07-23 NOTE — Progress Notes (Addendum)
MEDICATION RELATED CONSULT NOTE - FOLLOW UP   Pharmacy Consult for dilantin Indication: seizure  Allergies  Allergen Reactions  . 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 Norvasc [Amlodipine Besylate]     Itching, rash , hives .   [name] Morphine And Related Hives, Itching and Rash  Patient Measurements: Height: 5\' 5"  (165.1 cm) Weight: 188 lb 9.6 oz (85.548 kg) (b scale) IBW/kg (Calculated) : 57 Vital Signs: Temp: 99 F (37.2 C) (02/24 0277) Temp src: Oral (02/24 4128) BP: 146/66 mmHg (02/24 0959) Pulse Rate: 87 (02/24 0959) Intake/Output from previous day: 02/23 0701 - 02/24 0700 In: 104 [IV Piggyback:104] Out: 1450 [Urine:1450] Intake/Output from this shift: Total I/O In: 240 [P.O.:240] Out: 500 [Urine:500] Labs:  Recent Labs  07/20/13 2244 07/21/13 0330 07/22/13 0335 07/23/13 0410 07/23/13 1036  WBC 8.2  --  9.2 7.5  --   HGB 8.2*  --  8.5* 7.2*  --   HCT 23.6*  --  25.2* 21.3*  --   PLT 129*  --  167 144*  --   CREATININE  --  2.71* 2.59* 2.43*  --   MG  --   --  1.8  --   --   ALBUMIN  --   --   --   --  1.3*  BILITOT  --   --  7.0*  --   --   BILIDIR  --   --  5.7*  --   --   IBILI  --   --  1.3*  --   --    Lab Results  Component Value Date   PHENYTOIN 4.0* 07/23/2013    Estimated Creatinine Clearance: 28.6 ml/min (by C-G formula based on Cr of 2.43).   Assessment: 53 YOF admitted with seizure like activity on dilantin and Keppra PTA in the setting of fluoroquinolone use.   Admit total dilantin level was 2.9 with an albumin of 1.3 and SCr of 2.59 providing a corrected level of 8.1 mg/L (less than goal).  Patient was given a 200mg  IV bolus then changed to IV dilantin at 200mg  IV BID.   Repeat level (total) post bolus is 4 with albumin of 1.3 and SCr of 2.43/estimated CrCl~28.86mL/min. Therefore, level corrects to 11.1 which is at the lower end of goal. Neurology increased dose to 230mg  BID with an extra dose  now of 300mg .   Free dilantin level is pending (this will be more accurate in the setting of low albumin and renal dysfunction).   No further seizure activity reported.   Goal of Therapy:  Free dilantin level of 1-2 Total corrected dilantin level of 10-20  Plan:  Continue current dilantin dose as recommend by neurlogy. Follow-up free dilantin level to correlate in setting of low albumin and renal dysfunction.  Recheck level in 5-7 days to confirm at steady state.   Link Snuffer, PharmD, BCPS Clinical Pharmacist [phone] 07/23/2013,12:00 PM

## 2013-07-23 NOTE — Progress Notes (Signed)
INTERNAL MEDICINE TEACHING SERVICE DAILY PROGRESS NOTE Subjective: Patient reports she does not feel as well as usual but has no current acute complaints.  Denies any further seizure activity.  Afebrile but T max 100.1 overnight.  Objective: Vital signs in last 24 hours: Filed Vitals:   07/22/13 2045 07/22/13 2358 07/23/13 0632 07/23/13 0959  BP: 137/69 151/75 157/74 146/66  Pulse: 97 96 101 87  Temp: 99.3 F (37.4 C)  99 F (37.2 C)   TempSrc: Oral  Oral   Resp: 22  20   Height:      Weight:   188 lb 9.6 oz (85.548 kg)   SpO2: 97%  95%    Weight change: 3 lb 12 oz (1.7 kg)  Intake/Output Summary (Last 24 hours) at 07/23/13 1201 Last data filed at 07/23/13 1139  Gross per 24 hour  Intake    344 ml  Output   1950 ml  Net  -1606 ml    General: Vital signs reviewed. Resting comfortably in bed, NAD Lungs: Clear to Auscultation Bilaterally Heart: RRR; no extra sounds or murmurs  Abdomen: Bowel sounds present, soft, nontender; no hepatosplenomegaly  Extremities: 1+ lower extremity edema to knee bilaterally Neurologic: AAOx3  Lab Results: Basic Metabolic Panel:  Recent Labs Lab 07/19/13 1700 07/20/13 0612  07/22/13 0335 07/23/13 0410  NA 138 132*  < > 140 140  K 3.8 4.1  < > 3.4* 3.7  CL 98 94*  < > 97 98  CO2 22 23  < > 32 28  GLUCOSE 118* 409*  < > 43* 130*  BUN 108* 94*  < > 66* 56*  CREATININE 3.63* 3.05*  < > 2.59* 2.43*  CALCIUM 8.0* 7.4*  < > 7.8* 7.5*  MG  --   --   --  1.8  --   PHOS 4.9* 3.0  --   --   --   < > = values in this interval not displayed. Liver Function Tests:  Recent Labs Lab 07/18/13 2238  07/20/13 0612 07/22/13 0335 07/23/13 1036  AST 30  --  38*  --   --   ALT 38*  --  26  --   --   ALKPHOS 297*  --  234*  --   --   BILITOT 10.8*  < > 9.4* 7.0*  --   PROT 6.2  --  5.2*  --   --   ALBUMIN 1.8*  < > 1.3*  1.3*  --  1.3*  < > = values in this interval not displayed. No results found for this basename: LIPASE, AMYLASE,  in the  last 168 hours No results found for this basename: AMMONIA,  in the last 168 hours CBC:  Recent Labs Lab 07/18/13 2238  07/20/13 0612  07/22/13 0335 07/23/13 0410  WBC 20.7*  < > 9.8  < > 9.2 7.5  NEUTROABS 17.8*  --  7.1  --   --   --   HGB 9.3*  < > 7.1*  < > 8.5* 7.2*  HCT 26.8*  < > 21.0*  < > 25.2* 21.3*  MCV 98.2  < > 99.1  < > 96.6 98.2  PLT 159  < > 127*  < > 167 144*  < > = values in this interval not displayed. Cardiac Enzymes:  Recent Labs Lab 07/19/13 0525  TROPONINI <0.30   CBG:  Recent Labs Lab 07/22/13 0537 07/22/13 0751 07/22/13 1208 07/22/13 1655 07/22/13 2227 07/23/13 [id number]  GLUCAP  109* 224* 227* 206* 133* 137*   Anemia Panel:  Recent Labs Lab 07/19/13 0525  RETICCTPCT 1.7   Urine Drug Screen: Drugs of Abuse    Urinalysis:  Recent Labs Lab 07/19/13 0400  COLORURINE ORANGE*  LABSPEC 1.021  PHURINE 5.0  GLUCOSEU 250*  HGBUR TRACE*  BILIRUBINUR LARGE*  KETONESUR NEGATIVE  PROTEINUR >300*  UROBILINOGEN 2.0*  NITRITE NEGATIVE  LEUKOCYTESUR SMALL*   Micro Results: Recent Results (from the past 240 hour(s))  CULTURE, BLOOD (ROUTINE X 2)     Status: None   Collection Time    07/19/13 12:30 AM      Result Value Ref Range Status   Specimen Description BLOOD RIGHT HAND   Final   Special Requests BOTTLES DRAWN AEROBIC AND ANAEROBIC [facility] EACH   Final   Culture  Setup Time     Final   Value: 07/19/2013 08:47     Performed at [facility]   Culture     Final   Value: ESCHERICHIA COLI     Note: Gram Stain Report Called to,Read Back By and Verified With: Sherolyn Buba 07/19/13 AT 8:22PM THOMI     Performed at [facility]   Report Status 07/21/2013 FINAL   Final   Organism ID, Bacteria ESCHERICHIA COLI   Final  MRSA PCR SCREENING     Status: Abnormal   Collection Time    07/19/13  3:52 AM      Result Value Ref Range Status   MRSA by PCR POSITIVE (*) NEGATIVE Final   Comment:            The GeneXpert MRSA Assay (FDA      approved for NASAL specimens     only), is one component of a     comprehensive MRSA colonization     surveillance program. It is not     intended to diagnose MRSA     infection nor to guide or     monitor treatment for     MRSA infections.     RESULT CALLED TO, READ BACK BY AND VERIFIED WITH:     C PORTER,RN 07/19/13 0827 BY K SCHULTZ  CULTURE, BLOOD (ROUTINE X 2)     Status: None   Collection Time    07/19/13 11:40 PM      Result Value Ref Range Status   Specimen Description BLOOD RIGHT ARM   Final   Special Requests BOTTLES DRAWN AEROBIC AND ANAEROBIC 5CC EACH   Final   Culture  Setup Time     Final   Value: 07/20/2013 03:34     Performed at [facility]   Culture     Final   Value:        BLOOD CULTURE RECEIVED NO GROWTH TO DATE CULTURE WILL BE HELD FOR 5 DAYS BEFORE ISSUING A FINAL NEGATIVE REPORT     Performed at [facility]   Report Status PENDING   Incomplete  CULTURE, BLOOD (ROUTINE X 2)     Status: None   Collection Time    07/19/13 11:57 PM      Result Value Ref Range Status   Specimen Description BLOOD RIGHT ARM   Final   Special Requests BOTTLES DRAWN AEROBIC AND ANAEROBIC [facility] EACH   Final   Culture  Setup Time     Final   Value: 07/20/2013 03:36     Performed at [facility]   Culture     Final   Value:  BLOOD CULTURE RECEIVED NO GROWTH TO DATE CULTURE WILL BE HELD FOR 5 DAYS BEFORE ISSUING A FINAL NEGATIVE REPORT     Performed at [facility]   Report Status PENDING   Incomplete  CLOSTRIDIUM DIFFICILE BY PCR     Status: None   Collection Time    07/20/13 11:18 AM      Result Value Ref Range Status   C difficile by pcr NEGATIVE  NEGATIVE Final   Studies/Results: No results found. Medications: I have reviewed the patient's current medications. Scheduled Meds: . abacavir  600 mg Oral Daily  . acetaminophen  650 mg Oral Once  . antiseptic oral rinse  15 mL Mouth Rinse BID  . aspirin EC  81 mg Oral Daily  .   ceFAZolin (ANCEF) IV  1 g Intravenous Q12H  . Chlorhexidine Gluconate Cloth  6 each Topical Q0600  . Darunavir Ethanolate  800 mg Oral Q breakfast  . furosemide  160 mg Oral BID  . heparin  5,000 Units Subcutaneous 3 times per day  . hydrALAZINE  50 mg Oral TID  . insulin aspart  0-20 Units Subcutaneous TID WC  . insulin glargine  50 Units Subcutaneous Daily  . labetalol  200 mg Oral BID  . lamiVUDine  100 mg Oral Daily  . levETIRAcetam  500 mg Oral Q12H  . LORazepam  1 mg Intravenous QHS  . mupirocin ointment  1 application Nasal BID  . pantoprazole  40 mg Oral Daily  . phenytoin (DILANTIN) IV  200 mg Intravenous Q12H  . ritonavir  100 mg Oral Q breakfast  . sodium chloride  3 mL Intravenous Q12H   Continuous Infusions:   PRN Meds:.acetaminophen, LORazepam, ondansetron (ZOFRAN) IV, ondansetron, oxyCODONE Assessment/Plan: Michelle [name] is a 53 year old woman with past medical history of dCHF (EF normal, with Grade-2 diastolic dysfunction), HIV (CD4 490 and VL<20 on 05/07/13), chronic HCV, seizure disorder, hx of stroke, HTN, insulin dependent Type II DM, CKD, gout, and AIHA who presents with altered mental status.   # Sepsis E coli Bacteremia mostly likely secondary to UTI: Blood culture 1/2 on 07/19/2013 revealed E coli. Possible source of infection is GI, versus urinary tract. However, Urinalysis 2/20 negative for nitrites but with many bacteria. Abdomen ultrasound scan, remarkable for sludge, but without features of cholecystitis. Chest x-[place] to 20/15 with the right medial basilar airspace opacity but no acute cough. E Coli resistant to Ampicillin, Bactrim Plan  - Continue Cefazolin (as patient has pending surgery) will consider change after surgery to Keflex PO - follow repeat BCx (NGTD)  # Seizure disorder -  -Neurology consult appreciated. - Changed Abx. - Decreased Keppra due to renal insufficiency - Continue current dilantin dose with monitoring.  # Acute Kidney Injury on CKD -  Improving. Biopsy showed collapsing FSGS. Patient Creatine almost at baseline.  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 LDH elevated, bilirubin elevated (however direct and not indirect), hemoglobin stable from discharge. Low sucipicion for active hemolysis  - monitor CBC, Hgb drop noted from 8.5 to 7.2, likely due to lab error/ variation, patient was changed with antibiotics yesterday to Ceftriaxone.  Will repeat CBC this PM, if continues to drop will check LDH, Haptoglobin reticulocyte index and peripheral blood smear and stop cefazolin but do not think this represents acute AIHA. - hold off steroids for now  in the setting of active infection  # Diabetes: well controlled - Continue Lantus to 50 units  -SSI-R - Carb mod diet   # Hypertension : BP better controlled On home clonidine, lasix, labetalol, lisinopril, hydralazine. Initially BP was low but currently elevated.  - Labetalol 200mg  BID - hydralazine 50mg  TID -Can restart lisinopril if needed  # HIV - last CD4 = 490 (05/07/13)  -continue abacavir, prezista, lamivudine (renal adjust to 100mg ), norvir  # Increased Anion Gap Metabolic Acidosis resolved:  Secondary to uremia, associated with acute on chronic renal insufficiency.   # Diarrhea: improving - C diff neg.  Dispo: Disposition is deferred at this time, awaiting improvement of current medical problems.  Can consider transfer out of step down after neurology evaluation.  The patient does have a current PCP Burtis Junes, Arna Medici, MD), therefore will be requiring OPC follow-up after discharge.   The patient does not have transportation limitations that hinder transportation to clinic appointments.  .Services Needed at time of discharge: Y = Yes, Blank = No PT:   OT:   RN:   Equipment:   Other:     LOS: 5 days    Michelle [name] PGY 1 - Internal Medicine Teaching Service Pager: [phone]  07/23/2013, 12:01 PM

## 2013-07-23 NOTE — Progress Notes (Addendum)
Subjective: No further seizures  Exam: Filed Vitals:   07/23/13 0959  BP: 146/66  Pulse: 87  Temp:   Resp:    Gen: In bed, NAD MS: Awake, Alert, interactive ZO:XWRUE, EOMI Motor: MAEW  Impression: 53 yo F with breakthrough seizure in teh setting of fluoroquinalone use and subtheraputic dilantin level. I suspect she would benefit from a slightly higher dose of dilantin given the current level.   Recommendations: 1) Increase dilantin to 230mg  BID and give small extra dose now(300mg ), repeat level in 5 days.  2) Continue keppra, adjusted for creatinine clearance. Will need to increase if clearance > 30.  3) avoid fluoroquinalones.  4) Neurology will sign off at this time. Please call with any further questions or if further seizures occur.   Ritta Slot, MD [facility] [phone]  If 7pm- 7am, please page neurology on call at [phone].

## 2013-07-24 ENCOUNTER — Telehealth: Payer: Self-pay | Admitting: Vascular Surgery

## 2013-07-24 ENCOUNTER — Other Ambulatory Visit (HOSPITAL_COMMUNITY): Payer: Self-pay | Admitting: Internal Medicine

## 2013-07-24 ENCOUNTER — Encounter (HOSPITAL_COMMUNITY): Payer: Medicaid Other | Admitting: Anesthesiology

## 2013-07-24 ENCOUNTER — Encounter (HOSPITAL_COMMUNITY): Payer: Self-pay | Admitting: Anesthesiology

## 2013-07-24 ENCOUNTER — Other Ambulatory Visit: Payer: Self-pay | Admitting: *Deleted

## 2013-07-24 ENCOUNTER — Encounter (HOSPITAL_COMMUNITY)
Admission: EM | Disposition: A | Discharge status: Home Health | Payer: Self-pay | Source: Home / Self Care | Attending: Internal Medicine

## 2013-07-24 ENCOUNTER — Inpatient Hospital Stay (HOSPITAL_COMMUNITY): Payer: Medicaid Other | Admitting: Anesthesiology

## 2013-07-24 DIAGNOSIS — I10 Essential (primary) hypertension: Secondary | ICD-10-CM

## 2013-07-24 DIAGNOSIS — N186 End stage renal disease: Secondary | ICD-10-CM

## 2013-07-24 DIAGNOSIS — Z01810 Encounter for preprocedural cardiovascular examination: Secondary | ICD-10-CM

## 2013-07-24 DIAGNOSIS — I519 Heart disease, unspecified: Secondary | ICD-10-CM

## 2013-07-24 HISTORY — PX: AV FISTULA PLACEMENT: SHX1204

## 2013-07-24 LAB — BASIC METABOLIC PANEL
BUN: 47 mg/dL — AB (ref 6–23)
CHLORIDE: 96 meq/L (ref 96–112)
CO2: 27 mEq/L (ref 19–32)
Calcium: 7.9 mg/dL — ABNORMAL LOW (ref 8.4–10.5)
Creatinine, Ser: 2.4 mg/dL — ABNORMAL HIGH (ref 0.50–1.10)
GFR calc Af Amer: 25 mL/min — ABNORMAL LOW (ref >90)
GFR, EST NON AFRICAN AMERICAN: 22 mL/min — AB (ref >90)
GLUCOSE: 105 mg/dL — AB (ref 70–99)
POTASSIUM: 3.3 meq/L — AB (ref 3.7–5.3)
SODIUM: 138 meq/L (ref 137–147)

## 2013-07-24 LAB — CBC
HCT: 21.8 % — ABNORMAL LOW (ref 36.0–46.0)
HEMOGLOBIN: 7.4 g/dL — AB (ref 12.0–15.0)
MCH: 33.3 pg (ref 26.0–34.0)
MCHC: 33.9 g/dL (ref 30.0–36.0)
MCV: 98.2 fL (ref 78.0–100.0)
Platelets: 170 10*3/uL (ref 150–400)
RBC: 2.22 MIL/uL — AB (ref 3.87–5.11)
RDW: 19.2 % — ABNORMAL HIGH (ref 11.5–15.5)
WBC: 7.6 10*3/uL (ref 4.0–10.5)

## 2013-07-24 LAB — LACTATE DEHYDROGENASE: LDH: 280 U/L — ABNORMAL HIGH (ref 94–250)

## 2013-07-24 LAB — RETICULOCYTES
RBC.: 2.17 MIL/uL — AB (ref 3.87–5.11)
RETIC CT PCT: 1.7 % (ref 0.4–3.1)
Retic Count, Absolute: 36.9 10*3/uL (ref 19.0–186.0)

## 2013-07-24 LAB — GLUCOSE, CAPILLARY
GLUCOSE-CAPILLARY: 114 mg/dL — AB (ref 70–99)
GLUCOSE-CAPILLARY: 111 mg/dL — AB (ref 70–99)
Glucose-Capillary: 111 mg/dL — ABNORMAL HIGH (ref 70–99)
Glucose-Capillary: 156 mg/dL — ABNORMAL HIGH (ref 70–99)
Glucose-Capillary: 150 mg/dL — ABNORMAL HIGH (ref 70–99)

## 2013-07-24 LAB — HAPTOGLOBIN: Haptoglobin: 182 mg/dL (ref 45–215)

## 2013-07-24 SURGERY — ARTERIOVENOUS (AV) FISTULA CREATION
Anesthesia: Monitor Anesthesia Care | Site: Arm Lower | Laterality: Right

## 2013-07-24 MED ORDER — PROPOFOL 10 MG/ML IV BOLUS
INTRAVENOUS | Status: AC
  Filled 2013-07-24: qty 20

## 2013-07-24 MED ORDER — MIDAZOLAM HCL 2 MG/2ML IJ SOLN
INTRAMUSCULAR | Status: AC
  Filled 2013-07-24: qty 2

## 2013-07-24 MED ORDER — PROPOFOL INFUSION 10 MG/ML OPTIME
INTRAVENOUS | Status: DC | PRN
  Administered 2013-07-24: 25 ug/kg/min via INTRAVENOUS

## 2013-07-24 MED ORDER — METOCLOPRAMIDE HCL 5 MG/ML IJ SOLN: 10.0000 mg | Freq: Once | INTRAMUSCULAR | Status: DC | PRN

## 2013-07-24 MED ORDER — SODIUM CHLORIDE 0.9 % IV SOLN
INTRAVENOUS | Status: DC | PRN
  Administered 2013-07-24: 12:00:00 via INTRAVENOUS

## 2013-07-24 MED ORDER — FENTANYL CITRATE 0.05 MG/ML IJ SOLN
INTRAMUSCULAR | Status: AC
  Filled 2013-07-24: qty 5

## 2013-07-24 MED ORDER — PROPOFOL 10 MG/ML IV BOLUS
INTRAVENOUS | Status: DC | PRN
  Administered 2013-07-24: 110 mg via INTRAVENOUS

## 2013-07-24 MED ORDER — ONDANSETRON HCL 4 MG/2ML IJ SOLN
INTRAMUSCULAR | Status: AC
  Filled 2013-07-24: qty 2

## 2013-07-24 MED ORDER — ONDANSETRON HCL 4 MG/2ML IJ SOLN
INTRAMUSCULAR | Status: DC | PRN
  Administered 2013-07-24: 4 mg via INTRAVENOUS

## 2013-07-24 MED ORDER — SODIUM CHLORIDE 0.9 % IV SOLN
INTRAVENOUS | Status: DC
  Administered 2013-07-24: 11:00:00 via INTRAVENOUS

## 2013-07-24 MED ORDER — LIDOCAINE HCL (CARDIAC) 20 MG/ML IV SOLN
INTRAVENOUS | Status: DC | PRN
  Administered 2013-07-24: 50 mg via INTRAVENOUS

## 2013-07-24 MED ORDER — MIDAZOLAM HCL 5 MG/5ML IJ SOLN
INTRAMUSCULAR | Status: DC | PRN
  Administered 2013-07-24 (×2): 1 mg via INTRAVENOUS

## 2013-07-24 MED ORDER — LIDOCAINE HCL (PF) 1 % IJ SOLN
INTRAMUSCULAR | Status: DC | PRN
  Administered 2013-07-24: 30 mL

## 2013-07-24 MED ORDER — 0.9 % SODIUM CHLORIDE (POUR BTL) OPTIME
TOPICAL | Status: DC | PRN
  Administered 2013-07-24: 1000 mL

## 2013-07-24 MED ORDER — FENTANYL CITRATE 0.05 MG/ML IJ SOLN
INTRAMUSCULAR | Status: DC | PRN
  Administered 2013-07-24 (×2): 50 ug via INTRAVENOUS

## 2013-07-24 MED ORDER — LIDOCAINE HCL (CARDIAC) 20 MG/ML IV SOLN
INTRAVENOUS | Status: AC
  Filled 2013-07-24: qty 5

## 2013-07-24 MED ORDER — FENTANYL CITRATE 0.05 MG/ML IJ SOLN: 25.0000 ug | INTRAMUSCULAR | Status: DC | PRN

## 2013-07-24 MED ORDER — HEPARIN SODIUM (PORCINE) 5000 UNIT/ML IJ SOLN
INTRAMUSCULAR | Status: DC | PRN
  Administered 2013-07-24: 11:00:00

## 2013-07-24 SURGICAL SUPPLY — 34 items
ARMBAND PINK RESTRICT EXTREMIT (MISCELLANEOUS) ×4 IMPLANT
CANISTER SUCTION 2500CC (MISCELLANEOUS) ×2 IMPLANT
CLIP TI MEDIUM 6 (CLIP) ×2 IMPLANT
CLIP TI WIDE RED SMALL 6 (CLIP) ×2 IMPLANT
COVER PROBE W GEL 5X96 (DRAPES) ×2 IMPLANT
COVER SURGICAL LIGHT HANDLE (MISCELLANEOUS) ×2 IMPLANT
DECANTER SPIKE VIAL GLASS SM (MISCELLANEOUS) ×2 IMPLANT
DERMABOND ADVANCED (GAUZE/BANDAGES/DRESSINGS) ×1
DERMABOND ADVANCED .7 DNX12 (GAUZE/BANDAGES/DRESSINGS) ×1 IMPLANT
DRAIN PENROSE 1/4X12 LTX STRL (WOUND CARE) ×2 IMPLANT
ELECT REM PT RETURN 9FT ADLT (ELECTROSURGICAL) ×2
ELECTRODE REM PT RTRN 9FT ADLT (ELECTROSURGICAL) ×1 IMPLANT
GEL ULTRASOUND 20GR AQUASONIC (MISCELLANEOUS) ×2 IMPLANT
GLOVE BIO SURGEON STRL SZ 6.5 (GLOVE) ×6 IMPLANT
GLOVE BIO SURGEON STRL SZ7.5 (GLOVE) ×2 IMPLANT
GOWN STRL REUS W/ TWL LRG LVL3 (GOWN DISPOSABLE) ×5 IMPLANT
GOWN STRL REUS W/TWL LRG LVL3 (GOWN DISPOSABLE) ×5
KIT BASIN OR (CUSTOM PROCEDURE TRAY) ×2 IMPLANT
KIT ROOM TURNOVER OR (KITS) ×2 IMPLANT
LOOP VESSEL MINI RED (MISCELLANEOUS) ×2 IMPLANT
NS IRRIG 1000ML POUR BTL (IV SOLUTION) ×2 IMPLANT
PACK CV ACCESS (CUSTOM PROCEDURE TRAY) ×2 IMPLANT
PAD ARMBOARD 7.5X6 YLW CONV (MISCELLANEOUS) ×4 IMPLANT
SPONGE GAUZE 4X4 12PLY (GAUZE/BANDAGES/DRESSINGS) ×2 IMPLANT
SPONGE SURGIFOAM ABS GEL 100 (HEMOSTASIS) IMPLANT
SUT PROLENE 7 0 BV 1 (SUTURE) ×2 IMPLANT
SUT VIC AB 3-0 SH 27 (SUTURE) ×1
SUT VIC AB 3-0 SH 27X BRD (SUTURE) ×1 IMPLANT
SUT VICRYL 4-0 PS2 18IN ABS (SUTURE) ×2 IMPLANT
TAPE CLOTH SURG 4X10 WHT LF (GAUZE/BANDAGES/DRESSINGS) ×2 IMPLANT
TOWEL OR 17X24 6PK STRL BLUE (TOWEL DISPOSABLE) ×2 IMPLANT
TOWEL OR 17X26 10 PK STRL BLUE (TOWEL DISPOSABLE) ×2 IMPLANT
UNDERPAD 30X30 INCONTINENT (UNDERPADS AND DIAPERS) ×2 IMPLANT
WATER STERILE IRR 1000ML POUR (IV SOLUTION) ×2 IMPLANT

## 2013-07-24 NOTE — Anesthesia Postprocedure Evaluation (Signed)
  Anesthesia Post-op Note  Patient: Michelle [name]  Procedure(s) Performed: Procedure(s): RIGHT arm exploration of antecubital space (Right)  Patient Location: PACU  Anesthesia Type:General  Level of Consciousness: awake, alert  and oriented  Airway and Oxygen Therapy: Patient Spontanous Breathing and Patient connected to nasal cannula oxygen  Post-op Pain: mild  Post-op Assessment: Post-op Vital signs reviewed, Patient's Cardiovascular Status Stable, Respiratory Function Stable, Patent Airway and Pain level controlled  Post-op Vital Signs: stable  Complications: No apparent anesthesia complications

## 2013-07-24 NOTE — Anesthesia Preprocedure Evaluation (Signed)
Anesthesia Evaluation    Airway       Dental   Pulmonary shortness of breath, pneumonia -, COPDformer smoker,          Cardiovascular hypertension, Pt. on medications and On Home Beta Blockers +CHF + dysrhythmias     Neuro/Psych  Headaches, Seizures -,   Neuromuscular disease CVA    GI/Hepatic (+) Hepatitis -, C  Endo/Other  diabetes, Insulin Dependent  Renal/GU Renal disease     Musculoskeletal   Abdominal   Peds  Hematology  (+) anemia , HIV,   Anesthesia Other Findings   Reproductive/Obstetrics                           Anesthesia Physical Anesthesia Plan  ASA: III  Anesthesia Plan: MAC   Post-op Pain Management:    Induction: Intravenous  Airway Management Planned: Simple Face Mask  Additional Equipment:   Intra-op Plan:   Post-operative Plan:   Informed Consent:   Plan Discussed with:   Anesthesia Plan Comments:         Anesthesia Quick Evaluation

## 2013-07-24 NOTE — Progress Notes (Signed)
Pt had attempt at fistula today.  No usable veins on exploration.  I did not place a prosthetic graft today due to recent sepsis and persistant fevers.  I will schedule her for outpt visit in a few weeks to consider placement of an AV graft once she is afebrile and off antibiotics.  Fabienne Bruns, MD [facility] Office: [phone] Pager: [phone]

## 2013-07-24 NOTE — Progress Notes (Signed)
INTERNAL MEDICINE TEACHING SERVICE DAILY PROGRESS NOTE Subjective: Patient feels very well this morning, she reports not acute complaints.  She did have a Tmax overnight of 100.7  Objective: Vital signs in last 24 hours: Filed Vitals:   07/23/13 1546 07/23/13 2017 07/24/13 0605 07/24/13 1026  BP: 134/63 141/71 138/65 140/78  Pulse: 96 85 95 85  Temp: 98.7 F (37.1 C) 99 F (37.2 C) 100.7 F (38.2 C) 98.8 F (37.1 C)  TempSrc: Oral Oral Oral Oral  Resp: 18 18 16 18   Height:      Weight:   184 lb 4.9 oz (83.6 kg)   SpO2: 99% 99% 100% 99%   Weight change: -6 lb 13.4 oz (-3.1 kg)  Intake/Output Summary (Last 24 hours) at 07/24/13 1108 Last data filed at 07/24/13 1035  Gross per 24 hour  Intake    462 ml  Output   2827 ml  Net  -2365 ml    General: Vital signs reviewed. Resting comfortably in bed, NAD Lungs: Clear to Auscultation Bilaterally Heart: RRR; no extra sounds or murmurs  Abdomen: Bowel sounds present, soft, nontender; no hepatosplenomegaly  Extremities: trace extremity edema to knee bilaterally Neurologic: AAOx3  Lab Results: Basic Metabolic Panel:  Recent Labs Lab 07/19/13 1700 07/20/13 0612  07/22/13 0335 07/23/13 0410 07/24/13 0126  NA 138 132*  < > 140 140 138  K 3.8 4.1  < > 3.4* 3.7 3.3*  CL 98 94*  < > 97 98 96  CO2 22 23  < > 32 28 27  GLUCOSE 118* 409*  < > 43* 130* 105*  BUN 108* 94*  < > 66* 56* 47*  CREATININE 3.63* 3.05*  < > 2.59* 2.43* 2.40*  CALCIUM 8.0* 7.4*  < > 7.8* 7.5* 7.9*  MG  --   --   --  1.8  --   --   PHOS 4.9* 3.0  --   --   --   --   < > = values in this interval not displayed. Liver Function Tests:  Recent Labs Lab 07/18/13 2238  07/20/13 0612 07/22/13 0335 07/23/13 1036  AST 30  --  38*  --   --   ALT 38*  --  26  --   --   ALKPHOS 297*  --  234*  --   --   BILITOT 10.8*  < > 9.4* 7.0*  --   PROT 6.2  --  5.2*  --   --   ALBUMIN 1.8*  < > 1.3*  1.3*  --  1.3*  < > = values in this interval not  displayed. No results found for this basename: LIPASE, AMYLASE,  in the last 168 hours No results found for this basename: AMMONIA,  in the last 168 hours CBC:  Recent Labs Lab 07/18/13 2238  07/20/13 0612  07/23/13 2239 07/24/13 0126  WBC 20.7*  < > 9.8  < > 7.5 7.6  NEUTROABS 17.8*  --  7.1  --   --   --   HGB 9.3*  < > 7.1*  < > 7.0* 7.4*  HCT 26.8*  < > 21.0*  < > 20.7* 21.8*  MCV 98.2  < > 99.1  < > 97.6 98.2  PLT 159  < > 127*  < > 171 170  < > = values in this interval not displayed. Cardiac Enzymes:  Recent Labs Lab 07/19/13 0525  TROPONINI <0.30   CBG:  Recent Labs Lab 07/22/13 2227  07/23/13 0652 07/23/13 1134 07/23/13 1624 07/23/13 2209 07/24/13 0620  GLUCAP 133* 137* 165* 205* 109* 114*   Anemia Panel:  Recent Labs Lab 07/24/13 0126  RETICCTPCT 1.7   Urine Drug Screen: Drugs of Abuse    Urinalysis:  Recent Labs Lab 07/19/13 0400  COLORURINE ORANGE*  LABSPEC 1.021  PHURINE 5.0  GLUCOSEU 250*  HGBUR TRACE*  BILIRUBINUR LARGE*  KETONESUR NEGATIVE  PROTEINUR >300*  UROBILINOGEN 2.0*  NITRITE NEGATIVE  LEUKOCYTESUR SMALL*   Micro Results: Recent Results (from the past 240 hour(s))  CULTURE, BLOOD (ROUTINE X 2)     Status: None   Collection Time    07/19/13 12:30 AM      Result Value Ref Range Status   Specimen Description BLOOD RIGHT HAND   Final   Special Requests BOTTLES DRAWN AEROBIC AND ANAEROBIC [facility] EACH   Final   Culture  Setup Time     Final   Value: 07/19/2013 08:47     Performed at [facility]   Culture     Final   Value: ESCHERICHIA COLI     Note: Gram Stain Report Called to,Read Back By and Verified With: Sherolyn Buba 07/19/13 AT 8:22PM THOMI     Performed at [facility]   Report Status 07/21/2013 FINAL   Final   Organism ID, Bacteria ESCHERICHIA COLI   Final  MRSA PCR SCREENING     Status: Abnormal   Collection Time    07/19/13  3:52 AM      Result Value Ref Range Status   MRSA by PCR POSITIVE  (*) NEGATIVE Final   Comment:            The GeneXpert MRSA Assay (FDA     approved for NASAL specimens     only), is one component of a     comprehensive MRSA colonization     surveillance program. It is not     intended to diagnose MRSA     infection nor to guide or     monitor treatment for     MRSA infections.     RESULT CALLED TO, READ BACK BY AND VERIFIED WITH:     C PORTER,RN 07/19/13 0827 BY K SCHULTZ  CULTURE, BLOOD (ROUTINE X 2)     Status: None   Collection Time    07/19/13 11:40 PM      Result Value Ref Range Status   Specimen Description BLOOD RIGHT ARM   Final   Special Requests BOTTLES DRAWN AEROBIC AND ANAEROBIC 5CC EACH   Final   Culture  Setup Time     Final   Value: 07/20/2013 03:34     Performed at [facility]   Culture     Final   Value:        BLOOD CULTURE RECEIVED NO GROWTH TO DATE CULTURE WILL BE HELD FOR 5 DAYS BEFORE ISSUING A FINAL NEGATIVE REPORT     Performed at [facility]   Report Status PENDING   Incomplete  CULTURE, BLOOD (ROUTINE X 2)     Status: None   Collection Time    07/19/13 11:57 PM      Result Value Ref Range Status   Specimen Description BLOOD RIGHT ARM   Final   Special Requests BOTTLES DRAWN AEROBIC AND ANAEROBIC [facility] EACH   Final   Culture  Setup Time     Final   Value: 07/20/2013 03:36     Performed at [facility]   Culture     Final   Value:        BLOOD CULTURE RECEIVED NO GROWTH TO DATE CULTURE WILL BE HELD FOR 5 DAYS BEFORE ISSUING A FINAL NEGATIVE REPORT     [facility]   Report Status PENDING   Incomplete  CLOSTRIDIUM DIFFICILE BY PCR     Status: None   Collection Time    07/20/13 11:18 AM      Result Value Ref Range Status   C difficile by pcr NEGATIVE  NEGATIVE Final   Studies/Results: No results found. Medications: I have reviewed the patient's current medications. Scheduled Meds: . [facility][MAR HOLD] abacavir  600 mg Oral Daily  . [facility][MAR HOLD] acetaminophen  650 mg Oral  Once  . [facility][MAR HOLD] antiseptic oral rinse  15 mL Mouth Rinse BID  . [facility][MAR HOLD] aspirin EC  81 mg Oral Daily  . [MAR HOLD]  ceFAZolin (ANCEF) IV  1 g Intravenous Q12H  . [MAR HOLD] Darunavir Ethanolate  800 mg Oral Q breakfast  . [MAR HOLD] furosemide  160 mg Oral BID  . [MAR HOLD] heparin  5,000 Units Subcutaneous 3 times per day  . [MAR HOLD] hydrALAZINE  50 mg Oral TID  . [MAR HOLD] insulin aspart  0-20 Units Subcutaneous TID WC  . [MAR HOLD] insulin glargine  50 Units Subcutaneous Daily  . [facility][MAR HOLD] labetalol  200 mg Oral BID  . [facility][MAR HOLD] lamiVUDine  100 mg Oral Daily  . [MAR HOLD] levETIRAcetam  500 mg Oral Q12H  . [MAR HOLD] LORazepam  1 mg Intravenous QHS  . [MAR HOLD] pantoprazole  40 mg Oral Daily  . [MAR HOLD] phenytoin (DILANTIN) IV  230 mg Intravenous Q12H  . [facility][MAR HOLD] ritonavir  100 mg Oral Q breakfast  . [MAR HOLD] sodium chloride  3 mL Intravenous Q12H   Continuous Infusions:   PRN Meds:.[MAR HOLD] acetaminophen, [MAR HOLD] LORazepam, [MAR HOLD] ondansetron (ZOFRAN) IV, [MAR HOLD] ondansetron, [MAR HOLD] oxyCODONE Assessment/Plan: Michelle [name] is a 53 year old woman with past medical history of dCHF (EF normal, with Grade-2 diastolic dysfunction), HIV (CD4 490 and VL<20 on 05/07/13), chronic HCV, seizure disorder, hx of stroke, HTN, insulin dependent Type II DM, CKD, gout, and AIHA who presents with altered mental status.   # Sepsis E coli Bacteremia mostly likely secondary to UTI: Blood culture 1/2 on 07/19/2013 revealed E coli. resistant to Ampicillin, Bactrim Plan  - Continue Cefazolin will  change after surgery to Keflex PO - follow repeat BCx (NGTD)  # Seizure disorder -  -Neurology signed off - Avoid fluroquinolones - New Keppra dose 500mg  BID due to renal insufficieny will need to increase if GFR >30 - New dilantin dose 230 BID, will need level repeated in 5 days.  # Acute Kidney Injury on CKD - Improving. Biopsy showed collapsing FSGS. Patient Creatine almost  at baseline.  -Patient going for AVF creation today with Dr. 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 Mild drop yesterday repeat studies reassuring that this is not due to AIHA, And Hgb this AM is stable at 7.4 - Continue to monitor  # Diabetes: well controlled - Continue Lantus to 50 units  -SSI-R - Carb mod diet after surgery  # Hypertension : BP better controlled On home clonidine, lasix, labetalol, lisinopril, hydralazine. Initially BP was low but currently elevated.  - Labetalol 200mg  BID - hydralazine 50mg  TID -Can restart lisinopril if needed  #  HIV - last CD4 = 490 (05/07/13)  -continue abacavir, prezista, lamivudine (renal adjust to 100mg ), norvir  # Increased Anion Gap Metabolic Acidosis resolved:  Secondary to uremia, associated with acute on chronic renal insufficiency.   # Diarrhea: resolved  Diet: Currently NPO will give carb mod after Surgery Dispo: Disposition is deferred at this time, awaiting improvement of current medical problems.  Likely discharge tomorrow if surgery goes well.  The patient does have a current PCP Burtis Junes, Arna Medici, MD), therefore will be requiring OPC follow-up after discharge.   The patient does not have transportation limitations that hinder transportation to clinic appointments.  .Services Needed at time of discharge: Y = Yes, Blank = No PT:   OT:   RN:   Equipment:   Other:     LOS: 6 days   Gust Rung PGY 1 - Internal Medicine Teaching Service Pager: [phone]  07/24/2013, 11:08 AM

## 2013-07-24 NOTE — Telephone Encounter (Addendum)
Message copied by Fredrich Birks on Wed Jul 24, 2013  3:53 PM ------      Message from: Fabienne Bruns E      Created: Wed Jul 24, 2013  1:37 PM       Pt had exploration right antecubital region today.              She needs follow up appt in 2 weeks to consider right arm AV graft vs left arm AV fistula.  Please repeat vein map of left arm at that appt.            Charles ------  07/24/13: lm for pt re appt, dpm

## 2013-07-24 NOTE — Preoperative (Addendum)
Beta Blockers   Reason not to administer Beta Blockers:Labetelol at 1026

## 2013-07-24 NOTE — Significant Event (Signed)
patient down to surgery report given .

## 2013-07-24 NOTE — Op Note (Signed)
Procedure: Exploration of right antecubital region Preop: ESRD Postop: same Anesthesia: General  Findings: sclerotic cephalic and basilic vein  Operative details:  Room. The patient was placed in supine position on the operating room table. After adequate sedation the patient's entire right upper extremity was prepped and draped in usual sterile fashion. Local anesthesia was infiltrated in the right antecubital crease. A transverse incision was made near the antecubital crease. The cephalic vein was identified. Its external appearance was sclerotic. At this point ultrasound was used to identify the basilic vein. This was slowly lateral to the original incision. More local anesthesia was infiltrated laterally to expose the basilic vein. At this point the patient became fairly agitated and uncomfortable so a general anesthetic was commenced. The basilic vein was exposed and found to be less than 2 mm in diameter and also sclerotic in appearance. At this point the cephalic vein was dissected free circumferentially several centimeters more down towards the hand. The side branches were ligated and divided between silk ties. The distal end of the vein was then ligated and the vein transected. The vein did have a lumen but a 2 dilator would not pass through the vein. It was severely sclerotic and not usable for a fistula. The previously examined basilic vein was very small in diameter and not usable for fistula.  Therefore the subcutaneous tissues reapproximated using a running 3-0 Vicryl suture. The skin was closed with 4 Vicryl subcuticular stitch.  This may need to be rescheduled as an outpatient. Instrument sponge and needle counts were correct at the end of the case. The patient was taken to recovery in stable condition.The patient had had fevers in the last few days and if he does not feel that with her having intermittent fevers she was a candidate for placement of a prosthetic graft at this time.The patient  will be considered for a right upper arm graft once all of her infection issues are resolved and she is afebrile.  Fabienne Bruns, MD [facility] Office: [phone] Pager: [phone]

## 2013-07-24 NOTE — Progress Notes (Signed)
On night shift, scheduled night time dose of 43m Ativan given to patient. 180mAtivan left over in vial was wasted into the sharps container with witness of Nurse Tai. 20m88m.5ml) of Ativan given per scheduled order. Notified pharmacy and was instructed to document this issue as a progress note.

## 2013-07-24 NOTE — H&P (View-Only) (Signed)
Vascular Surgery Consultation  Reason for Consult: Vascular access  HPI: Michelle [name] is a 53 y.o. female who presents for evaluation of vascular access. Patient has chronic renal insufficiency-not yet on hemodialysis. Has nephrotic syndrome. Was admitted with altered mental status and worsening seizures. Also has history of CVA, HIV, meningitis, and hepatitis C virus.   Past Medical History  Diagnosis Date  . Seizures   . Stroke   . Meningitis   . HIV (human immunodeficiency virus infection)   . Hypertension   . Gout   . Muscle spasms of head and/or neck   . CKD (chronic kidney disease)   . CHF (congestive heart failure)     [name] 06/18/2013  . HCV (hepatitis C virus)     chronic/notes 06/18/2013  . Type II diabetes mellitus     [name] 06/18/2013  . AIHA (autoimmune hemolytic anemia)     [name] 06/18/2013  . Hypertensive encephalopathy ~ 05/2013    hospitalaized/notes 06/18/2013  . Daily headache     "for the last 6 years/notes 06/18/2013  . Exertional shortness of breath     [name] 06/18/2013  . Anxiety     [name] 06/18/2013  . Nephrotic syndrome   . History of syphilis   . High cholesterol    Past Surgical History  Procedure Laterality Date  . Hip pinning Right    History   Social History  . Marital Status: Single    Spouse Name: N/A    Number of Children: 4  . Years of Education: 11th   Social History Main Topics  . Smoking status: Former Smoker    Types: Cigarettes    Quit date: 06/19/2010  . Smokeless tobacco: Never Used  . Alcohol Use: No  . Drug Use: No     Comment: past history of alcohol, cocaine and IV drug use  . Sexual Activity: Not Currently    Partners: Male     Comment: given condoms   Other Topics Concern  . None   Social History Narrative   Patient lives at home with sister.    Patient is unemployed.    Patient is single.    Patient has 2 alive and 2 deceased.    Patient has 11th grade education.          Family History  Problem  Relation Age of Onset  . Cancer - Colon Mother   . Cancer Father   . Diabetes    . Hypertension Father    Allergies  Allergen Reactions  . 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 Norvasc [Amlodipine Besylate]     Itching, rash , hives .   [name] Morphine And Related Hives, Itching and Rash   Prior to Admission medications   Medication Sig Start Date End Date Taking? Authorizing Provider  abacavir (ZIAGEN) 300 MG tablet Take 2 tablets (600 mg total) by mouth daily. 06/20/13  Yes Vivi Barrack, MD  acyclovir (ZOVIRAX) 800 MG tablet Take 1 tablet (800 mg total) by mouth daily. 06/02/13  Yes Vivi Barrack, MD  aspirin EC 81 MG tablet Take 1 tablet (81 mg total) by mouth daily. 06/02/13  Yes Vivi Barrack, MD  cholecalciferol (VITAMIN D) 1000 UNITS tablet Take 1,000 Units by mouth daily.   Yes Historical Provider, MD  cloNIDine (CATAPRES) 0.2 MG tablet Take 1 tablet (0.2 mg total) by mouth 2 (two) times daily. 06/02/13  Yes Vivi Barrack, MD  Darunavir Ethanolate (PREZISTA)  800 MG tablet Take 1 tablet (800 mg total) by mouth daily with breakfast. 06/20/13  Yes Sarah Cater, MD  furosemide (LASIX) 80 MG tablet Take 2 tablets (160 mg total) by mouth 2 (two) times daily. 07/11/13  Yes Richard Kazibwe, MD  gentamicin (GARAMYCIN) 0.3 % ophthalmic ointment Place 1 application into both eyes 4 (four) times daily.   Yes Historical Provider, MD  glucose blood (ACCU-CHEK AVIVA) test strip Use as instructed 06/13/13  Yes Nora Sadek, MD  hydrALAZINE (APRESOLINE) 50 MG tablet Take 50 mg by mouth 3 (three) times daily.   Yes Historical Provider, MD  insulin aspart (NOVOLOG) 100 UNIT/ML injection Inject into the skin 3 (three) times daily before meals. Home Sliding scale   Yes Historical Provider, MD  insulin glargine (LANTUS) 100 UNIT/ML injection Inject 30 Units into the skin at bedtime.   Yes Historical Provider, MD  labetalol (NORMODYNE) 100 MG tablet Take 1 tablet (100 mg total) by  mouth 2 (two) times daily. 06/02/13  Yes Sarah Cater, MD  lamiVUDine (EPIVIR) 150 MG tablet Take 1 tablet (150 mg total) by mouth daily. 06/20/13  Yes Sarah Cater, MD  lisinopril (PRINIVIL,ZESTRIL) 20 MG tablet Take 1 tablet (20 mg total) by mouth daily. 07/11/13  Yes Richard Kazibwe, MD  LORazepam (ATIVAN) 1 MG tablet Take 1 mg by mouth daily. Take one tablet for seizure prevention.   Yes Historical Provider, MD  methocarbamol (ROBAXIN) 500 MG tablet Take 500 mg by mouth every 6 (six) hours as needed for muscle spasms.   Yes Historical Provider, MD  Oxycodone HCl 10 MG TABS Take 1 tablet (10 mg total) by mouth every 6 (six) hours as needed (severe pain). 07/02/13  Yes Kaitlyn Szekalski, PA-C  pantoprazole (PROTONIX) 40 MG tablet Take 1 tablet (40 mg total) by mouth daily. 06/02/13  Yes Sarah Cater, MD  phenytoin (DILANTIN) 50 MG tablet Chew 4 tablets (200 mg total) by mouth 2 (two) times daily. 07/11/13  Yes Richard Kazibwe, MD  ritonavir (NORVIR) 100 MG TABS tablet Take 100 mg by mouth daily with breakfast.   Yes Historical Provider, MD     Positive ROS: History not reliable from patient  All other systems have been reviewed and were otherwise negative with the exception of those mentioned in the HPI and as above.  Physical Exam: Filed Vitals:   07/22/13 1415  BP: 172/80  Pulse:   Temp:   Resp:     General: Alert, no acute distress-does not answer all questions appropriately  HEENT: Normal for age Cardiovascular: Regular rate and rhythm. Carotid pulses 2+, no bruits audible Respiratory: Clear to auscultation. No cyanosis, no use of accessory musculature GI: No organomegaly, abdomen is soft and non-tender Skin: No lesions in the area of chief complaint Neurologic: Sensation intact distally Psychiatric: Patient is competent for consent with normal mood and affect Musculoskeletal: No obvious deformities Extremities: 3+ brachial and radial pulses palpable bilaterally. Lower extremity 3+  femoral and dorsalis pedis pulses palpable.   Imaging reviewed: Vein mapping depending   Assessment/Plan:  Patient with chronic renal insufficiency-not on hemodialysis, admitted with altered mental status and seizure disorder problems Will need evaluation of her AV fistula when stable Vein mapping pending-we'll follow with you and decide about timing for access   James Lawson, MD 07/22/2013 3:26 PM      

## 2013-07-24 NOTE — Progress Notes (Signed)
Patient being followed at a distance since 07/21/13. PT/OT evaluations pending medical issues which are being addressed.  Will sign off for now. Please re-consult if CIR needs indicated once patient is mobilized.

## 2013-07-24 NOTE — Anesthesia Procedure Notes (Signed)
Procedure Name: LMA Insertion Date/Time: 07/24/2013 12:43 PM Performed by: Marni Griffon Pre-anesthesia Checklist: Patient identified, Emergency Drugs available, Suction available and Patient being monitored Patient Re-evaluated:Patient Re-evaluated prior to inductionOxygen Delivery Method: Circle system utilized Preoxygenation: Pre-oxygenation with 100% oxygen Intubation Type: IV induction Ventilation: Mask ventilation without difficulty LMA: LMA inserted LMA Size: 4.0 Number of attempts: 1 Placement Confirmation: breath sounds checked- equal and bilateral and positive ETCO2 Tube secured with: taped across cheeks. Dental Injury: Teeth and Oropharynx as per pre-operative assessment

## 2013-07-24 NOTE — Interval H&P Note (Signed)
History and Physical Interval Note:  07/24/2013 11:33 AM   Vein mapping reviewed.  Upper arm cephalic vein seems reasonable on right arm. Poor vein left arm cephalic. Michelle [name]  has presented today for surgery, with the diagnosis of End Stage Renal Disease  The various methods of treatment have been discussed with the patient and family. After consideration of risks, benefits and other options for treatment, the patient has consented to right arm av fistula as a surgical intervention .  The patient's history has been reviewed, patient examined, no change in status, stable for surgery.  I have reviewed the patient's chart and labs.  Questions were answered to the patient's satisfaction.     [name]

## 2013-07-24 NOTE — Transfer of Care (Signed)
Immediate Anesthesia Transfer of Care Note  Patient: Michelle [name]  Procedure(s) Performed: Procedure(s): ARTERIOVENOUS (AV) FISTULA CREATION-RIGHT and left exploration of antecubicle space (Right)  Patient Location: PACU  Anesthesia Type:MAC and General  Level of Consciousness: awake, oriented and patient cooperative  Airway & Oxygen Therapy: Patient Spontanous Breathing and Patient connected to nasal cannula oxygen  Post-op Assessment: Report given to PACU RN and Post -op Vital signs reviewed and stable  Post vital signs: Reviewed and stable  Complications: No apparent anesthesia complications

## 2013-07-25 LAB — BASIC METABOLIC PANEL
BUN: 48 mg/dL — ABNORMAL HIGH (ref 6–23)
CO2: 25 mEq/L (ref 19–32)
CREATININE: 2.6 mg/dL — AB (ref 0.50–1.10)
Calcium: 7.2 mg/dL — ABNORMAL LOW (ref 8.4–10.5)
Chloride: 98 mEq/L (ref 96–112)
GFR calc Af Amer: 23 mL/min — ABNORMAL LOW (ref >90)
GFR, EST NON AFRICAN AMERICAN: 20 mL/min — AB (ref >90)
GLUCOSE: 122 mg/dL — AB (ref 70–99)
POTASSIUM: 3.6 meq/L — AB (ref 3.7–5.3)
Sodium: 137 mEq/L (ref 137–147)

## 2013-07-25 LAB — GLUCOSE, CAPILLARY
GLUCOSE-CAPILLARY: 135 mg/dL — AB (ref 70–99)
GLUCOSE-CAPILLARY: 186 mg/dL — AB (ref 70–99)
Glucose-Capillary: 149 mg/dL — ABNORMAL HIGH (ref 70–99)
Glucose-Capillary: 133 mg/dL — ABNORMAL HIGH (ref 70–99)

## 2013-07-25 LAB — CBC
HEMATOCRIT: 19.4 % — AB (ref 36.0–46.0)
HEMOGLOBIN: 6.5 g/dL — AB (ref 12.0–15.0)
MCH: 33 pg (ref 26.0–34.0)
MCHC: 33.5 g/dL (ref 30.0–36.0)
MCV: 98.5 fL (ref 78.0–100.0)
Platelets: 175 10*3/uL (ref 150–400)
RBC: 1.97 MIL/uL — ABNORMAL LOW (ref 3.87–5.11)
RDW: 19.5 % — ABNORMAL HIGH (ref 11.5–15.5)
WBC: 6.5 10*3/uL (ref 4.0–10.5)

## 2013-07-25 LAB — PHENYTOIN LEVEL, FREE AND TOTAL
Phenytoin Bound: 1.9 mg/L
Phenytoin, Free: 1.3 mg/L (ref 1.0–2.0)
Phenytoin, Total: 3.2 mg/L — ABNORMAL LOW (ref 10.0–20.0)

## 2013-07-25 LAB — HEMOGLOBIN AND HEMATOCRIT, BLOOD
HCT: 23.7 % — ABNORMAL LOW (ref 36.0–46.0)
Hemoglobin: 8.1 g/dL — ABNORMAL LOW (ref 12.0–15.0)

## 2013-07-25 LAB — PREPARE RBC (CROSSMATCH)

## 2013-07-25 MED ORDER — DIPHENHYDRAMINE HCL 25 MG PO CAPS
25.0000 mg | ORAL_CAPSULE | Freq: Four times a day (QID) | ORAL | Status: DC | PRN
  Administered 2013-07-25 – 2013-07-27 (×5): 25 mg via ORAL
  Filled 2013-07-25 (×5): qty 1

## 2013-07-25 MED ORDER — CEPHALEXIN 500 MG PO CAPS
500.0000 mg | ORAL_CAPSULE | Freq: Two times a day (BID) | ORAL | Status: DC
  Administered 2013-07-25 – 2013-07-27 (×5): 500 mg via ORAL
  Filled 2013-07-25 (×6): qty 1

## 2013-07-25 MED ORDER — DIPHENHYDRAMINE HCL 25 MG PO CAPS
25.0000 mg | ORAL_CAPSULE | Freq: Three times a day (TID) | ORAL | Status: DC | PRN
  Administered 2013-07-25: 25 mg via ORAL
  Filled 2013-07-25: qty 1

## 2013-07-25 MED ORDER — PHENYTOIN SODIUM EXTENDED 30 MG PO CAPS
30.0000 mg | ORAL_CAPSULE | Freq: Two times a day (BID) | ORAL | Status: DC
  Administered 2013-07-25 – 2013-07-27 (×5): 30 mg via ORAL
  Filled 2013-07-25 (×6): qty 1

## 2013-07-25 MED ORDER — PHENYTOIN SODIUM EXTENDED 100 MG PO CAPS
200.0000 mg | ORAL_CAPSULE | Freq: Two times a day (BID) | ORAL | Status: DC
  Administered 2013-07-25 – 2013-07-27 (×5): 200 mg via ORAL
  Filled 2013-07-25 (×6): qty 2

## 2013-07-25 NOTE — Progress Notes (Signed)
  Date: 07/25/2013  Patient name: Michelle [name]  Medical record number: [id number]  Date of birth: [birth year]   This patient has been seen and the plan of care was discussed with the house staff. Please see their note for complete details. I concur with their findings with the following additions/corrections:  Surgery yesterday for access but was unable to do and will reschedule as outpt.  Still with tempt to 100.7 today but no signs of new infection.  Can change to oral coverage.  Will need to ambulate.  Hgb 6.5 may be from surgery and to get a transfusion.  Will need to recheck in am and if decreasing, consider hemolytic evaluation.    Gardiner Barefoot, MD 07/25/2013, 9:51 AM

## 2013-07-25 NOTE — Progress Notes (Signed)
INTERNAL MEDICINE TEACHING SERVICE DAILY PROGRESS NOTE Subjective: Patient's Hgb dropped to 6.5 this AM, 1 unit PRBC ordered. Patient reports she feels very well, denies any acute complaints, wants to go home.  She did have a Tmax overnight of 100.7 Objective: Vital signs in last 24 hours: Filed Vitals:   07/24/13 1538 07/24/13 1819 07/24/13 2215 07/25/13 0500  BP: 154/78 136/66 136/63 143/62  Pulse: 87 87 95 96  Temp: 97.5 F (36.4 C) 98.7 F (37.1 C) 99.9 F (37.7 C) 100.7 F (38.2 C)  TempSrc: Axillary Oral Oral Oral  Resp: 20 18 18 18   Height:      Weight:    185 lb 13.6 oz (84.3 kg)  SpO2: 100% 100% 100% 99%   Weight change: 1 lb 8.7 oz (0.7 kg)  Intake/Output Summary (Last 24 hours) at 07/25/13 0850 Last data filed at 07/25/13 0739  Gross per 24 hour  Intake 1142.53 ml  Output   1685 ml  Net -542.47 ml    General: Vital signs reviewed. Resting comfortably in bed, NAD Lungs: Clear to Auscultation Bilaterally Heart: RRR; no extra sounds or murmurs  Abdomen: Bowel sounds present, soft, nontender; no hepatosplenomegaly  Extremities: trace extremity edema to knee bilaterally Neurologic: AAOx3  Lab Results: Basic Metabolic Panel:  Recent Labs Lab 07/19/13 1700 07/20/13 0612  07/22/13 0335  07/24/13 0126 07/25/13 0450  NA 138 132*  < > 140  < > 138 137  K 3.8 4.1  < > 3.4*  < > 3.3* 3.6*  CL 98 94*  < > 97  < > 96 98  CO2 22 23  < > 32  < > 27 25  GLUCOSE 118* 409*  < > 43*  < > 105* 122*  BUN 108* 94*  < > 66*  < > 47* 48*  CREATININE 3.63* 3.05*  < > 2.59*  < > 2.40* 2.60*  CALCIUM 8.0* 7.4*  < > 7.8*  < > 7.9* 7.2*  MG  --   --   --  1.8  --   --   --   PHOS 4.9* 3.0  --   --   --   --   --   < > = values in this interval not displayed. Liver Function Tests:  Recent Labs Lab 07/18/13 2238  07/20/13 0612 07/22/13 0335 07/23/13 1036  AST 30  --  38*  --   --   ALT 38*  --  26  --   --   ALKPHOS 297*  --  234*  --   --   BILITOT 10.8*  < > 9.4*  7.0*  --   PROT 6.2  --  5.2*  --   --   ALBUMIN 1.8*  < > 1.3*  1.3*  --  1.3*  < > = values in this interval not displayed. No results found for this basename: LIPASE, AMYLASE,  in the last 168 hours No results found for this basename: AMMONIA,  in the last 168 hours CBC:  Recent Labs Lab 07/18/13 2238  07/20/13 0612  07/24/13 0126 07/25/13 0450  WBC 20.7*  < > 9.8  < > 7.6 6.5  NEUTROABS 17.8*  --  7.1  --   --   --   HGB 9.3*  < > 7.1*  < > 7.4* 6.5*  HCT 26.8*  < > 21.0*  < > 21.8* 19.4*  MCV 98.2  < > 99.1  < > 98.2 98.5  PLT  159  < > 127*  < > 170 175  < > = values in this interval not displayed. Cardiac Enzymes:  Recent Labs Lab 07/19/13 0525  TROPONINI <0.30   CBG:  Recent Labs Lab 07/24/13 0620 07/24/13 1123 07/24/13 1324 07/24/13 1617 07/24/13 2120 07/25/13 0613  GLUCAP 114* 111* 111* 156* 150* 135*   Anemia Panel:  Recent Labs Lab 07/24/13 0126  RETICCTPCT 1.7   Urine Drug Screen: Drugs of Abuse    Urinalysis:  Recent Labs Lab 07/19/13 0400  COLORURINE ORANGE*  LABSPEC 1.021  PHURINE 5.0  GLUCOSEU 250*  HGBUR TRACE*  BILIRUBINUR LARGE*  KETONESUR NEGATIVE  PROTEINUR >300*  UROBILINOGEN 2.0*  NITRITE NEGATIVE  LEUKOCYTESUR SMALL*   Micro Results: Recent Results (from the past 240 hour(s))  CULTURE, BLOOD (ROUTINE X 2)     Status: None   Collection Time    07/19/13 12:30 AM      Result Value Ref Range Status   Specimen Description BLOOD RIGHT HAND   Final   Special Requests BOTTLES DRAWN AEROBIC AND ANAEROBIC [facility] EACH   Final   Culture  Setup Time     Final   Value: 07/19/2013 08:47     Performed at [facility]   Culture     Final   Value: ESCHERICHIA COLI     Note: Gram Stain Report Called to,Read Back By and Verified With: Sherolyn Buba 07/19/13 AT 8:22PM THOMI     Performed at [facility]   Report Status 07/21/2013 FINAL   Final   Organism ID, Bacteria ESCHERICHIA COLI   Final  MRSA PCR SCREENING      Status: Abnormal   Collection Time    07/19/13  3:52 AM      Result Value Ref Range Status   MRSA by PCR POSITIVE (*) NEGATIVE Final   Comment:            The GeneXpert MRSA Assay (FDA     approved for NASAL specimens     only), is one component of a     comprehensive MRSA colonization     surveillance program. It is not     intended to diagnose MRSA     infection nor to guide or     monitor treatment for     MRSA infections.     RESULT CALLED TO, READ BACK BY AND VERIFIED WITH:     C PORTER,RN 07/19/13 0827 BY K SCHULTZ  CULTURE, BLOOD (ROUTINE X 2)     Status: None   Collection Time    07/19/13 11:40 PM      Result Value Ref Range Status   Specimen Description BLOOD RIGHT ARM   Final   Special Requests BOTTLES DRAWN AEROBIC AND ANAEROBIC 5CC EACH   Final   Culture  Setup Time     Final   Value: 07/20/2013 03:34     Performed at [facility]   Culture     Final   Value:        BLOOD CULTURE RECEIVED NO GROWTH TO DATE CULTURE WILL BE HELD FOR 5 DAYS BEFORE ISSUING A FINAL NEGATIVE REPORT     Performed at [facility]   Report Status PENDING   Incomplete  CULTURE, BLOOD (ROUTINE X 2)     Status: None   Collection Time    07/19/13 11:57 PM      Result Value Ref Range Status   Specimen Description BLOOD RIGHT ARM  Final   Special Requests BOTTLES DRAWN AEROBIC AND ANAEROBIC [facility] EACH   Final   Culture  Setup Time     Final   Value: 07/20/2013 03:36     Performed at [facility]   Culture     Final   Value:        BLOOD CULTURE RECEIVED NO GROWTH TO DATE CULTURE WILL BE HELD FOR 5 DAYS BEFORE ISSUING A FINAL NEGATIVE REPORT     Performed at [facility]   Report Status PENDING   Incomplete  CLOSTRIDIUM DIFFICILE BY PCR     Status: None   Collection Time    07/20/13 11:18 AM      Result Value Ref Range Status   C difficile by pcr NEGATIVE  NEGATIVE Final   Studies/Results: No results found. Medications: I have reviewed the patient's current  medications. Scheduled Meds: . abacavir  600 mg Oral Daily  . acetaminophen  650 mg Oral Once  . antiseptic oral rinse  15 mL Mouth Rinse BID  . aspirin EC  81 mg Oral Daily  .  ceFAZolin (ANCEF) IV  1 g Intravenous Q12H  . Darunavir Ethanolate  800 mg Oral Q breakfast  . furosemide  160 mg Oral BID  . heparin  5,000 Units Subcutaneous 3 times per day  . hydrALAZINE  50 mg Oral TID  . insulin aspart  0-20 Units Subcutaneous TID WC  . insulin glargine  50 Units Subcutaneous Daily  . labetalol  200 mg Oral BID  . lamiVUDine  100 mg Oral Daily  . levETIRAcetam  500 mg Oral Q12H  . LORazepam  1 mg Intravenous QHS  . pantoprazole  40 mg Oral Daily  . phenytoin (DILANTIN) IV  230 mg Intravenous Q12H  . ritonavir  100 mg Oral Q breakfast  . sodium chloride  3 mL Intravenous Q12H   Continuous Infusions:   PRN Meds:.acetaminophen, LORazepam, ondansetron (ZOFRAN) IV, ondansetron, oxyCODONE Assessment/Plan: # Sepsis E coli Bacteremia mostly likely secondary to UTI/Pylenephritis?: Blood culture 1/2 on 07/19/2013 revealed E coli. resistant to Ampicillin, Bactrim. Patient with +  Patient continues to have some intermittent mild fevers. Plan  - Change to Keflex PO (today is day 7 of Abx) - follow repeat BCx (NGTD)- should be finalized today  # Seizure disorder -  -Neurology signed off - Avoid fluroquinolones - New Keppra dose 500mg  BID due to renal insufficieny will need to increase if GFR >30 - New dilantin dose 230 BID, will need level repeated at hospital followup  # Acute Kidney Injury on CKD - Improving. Biopsy showed collapsing FSGS. Patient Creatine almost at baseline. -Patient went to surgery yesterday, no useable veins on exploration, graft not placed due to recent sepsis and persistent fevers.  Will have outpatient follow up scheduled outpatient graft placement in a few weeks.   # Autoimmune Hemolytic Anemia - ? Related to ceftriaxone. Mild drop yesterday repeat studies reassuring  that this is not due to AIHA, And Hgb this AM down to 6.5 patient did undergo right antecubital exploration yesterday. - Transfuse 1 units, CBC post transfusion.  # Diabetes: well controlled - Continue Lantus to 50 units  -SSI-R - Carb mod diet after surgery  # Hypertension : BP better controlled On home clonidine, lasix, labetalol, lisinopril, hydralazine. Initially BP was low but currently elevated.  - Labetalol 200mg  BID - hydralazine 50mg  TID -Can restart lisinopril if needed  # HIV - last CD4 = 490 (05/07/13)  -continue abacavir,  prezista, lamivudine (renal adjust to 100mg ), norvir  # Increased Anion Gap Metabolic Acidosis resolved:  Secondary to uremia, associated with acute on chronic renal insufficiency.   #Deconditioning -Will have PT/ OT re-eval patient.  # Diarrhea: resolved  Diet: Carb mod Dispo: Disposition is deferred at this time, awaiting improvement of current medical problems.  Likely discharge tomorrow  The patient does have a current PCP Burtis Junes, Arna Medici, MD), therefore will be requiring OPC follow-up after discharge.   The patient does not have transportation limitations that hinder transportation to clinic appointments.  .Services Needed at time of discharge: Y = Yes, Blank = No PT: y (resume HH)  OT:   RN:   Equipment:   Other:     LOS: 7 days   Gust Rung PGY 1 - Internal Medicine Teaching Service Pager: [phone]  07/25/2013, 8:50 AM

## 2013-07-25 NOTE — Progress Notes (Signed)
PT Cancellation Note  Patient Details Name: Michelle [name] MRN: [id number] DOB: April 15, 1961   Cancelled Treatment:     MD wanting pt up walking today but pt refused. She said she didn't feel good.  I spoke with her nurse, Dellie Burns,  And she has been trying to get her up to sit in a chair but pt refusing.  She will try to get pt up later tonight if pt willing.  Will attempt PT eval tomorrow.   Judson Roch 07/25/2013, 4:33 PM 07/25/2013   Ranae Palms, PT

## 2013-07-25 NOTE — Progress Notes (Signed)
Patient's hemoglobin - 6.5. No bleeding noted from teeth/gums, or from any oriface and no bruising noted on skin. Patient did blow her nose and very small amount of blood noted in tissue this morning but not sure if nares are dry from the oxygen via nasal cannula. Otherwise patient complains of no discomfort at this time. On-call hospitalist notified of low hemoglobin and orders were written for type and cross for blood transfusion. Will continue to monitor patient to end of shift.

## 2013-07-25 NOTE — Progress Notes (Signed)
Pt a/o, no c/o pain, pt c/o itching throughout body, no hives/rash noted, pt states "I've been itchy like this for years", PRN benadryl given as ordered, lotion applied to skin.  Pt received 1 unit of bld, tolerated well, temp still low grade (99.1-99.7) vss otherwise, pt stable

## 2013-07-25 NOTE — Progress Notes (Signed)
CRITICAL VALUE ALERT  Critical value received:  Hemoglobin 6.5  Date of notification:  07/25/2013  Time of notification:  0546  Critical value read back:yes  Nurse who received alert:  Milta Deiters  MD notified (1st page):  K.Schorr,NP- Hospitalist  Time of first page:  [phone]  MD notified (2nd page):n/a  Time of second page:n/a  Responding MD:  K,Schorr,NP-Hospitalist  Time MD responded:  [phone]

## 2013-07-25 NOTE — Care Management Note (Addendum)
    Page 1 of 2   07/26/2013     3:19:47 PM   CARE MANAGEMENT NOTE 07/26/2013  Patient:  Michelle [name], Michelle [name]   Account Number:  [id number]  Date Initiated:  07/25/2013  Documentation initiated by:  HUTCHINSON,CRYSTAL  Subjective/Objective Assessment:   TF from 2C  Admitted with hyperglycemia, jaundice, ESRD     Action/Plan:   evaluation of her AV fistula when stable  Vein mapping pending//resume HH services   Anticipated DC Date:  07/26/2013   Anticipated DC Plan:  HOME W HOME HEALTH SERVICES      DC Planning Services  CM consult      [facility] Choice  HOME HEALTH  Resumption Of Svcs/PTA Provider   Choice offered to / List presented to:  C-1 Patient        HH arranged  HH-1 RN  HH-10 DISEASE MANAGEMENT  HH-2 PT      HH agency  [facility]   Status of service:  Completed, signed off Medicare Important Message given?   (If response is "NO", the following Medicare IM given date fields will be blank) Date Medicare IM given:   Date Additional Medicare IM given:    Discharge Disposition:    Per UR Regulation:  Reviewed for med. necessity/level of care/duration of stay  If discussed at Long Length of Stay Meetings, dates discussed:   07/25/2013    Comments:  07/26/13 1400 Oletta Cohn, RN, BSN, [place] [phone] Pt currently active with [facility] for RN/PT services.  Resumption of care requested.  Kizzie Furnish, RN of [facility] notified.  No DME needs identified at this time.

## 2013-07-26 ENCOUNTER — Encounter (HOSPITAL_COMMUNITY): Payer: Self-pay | Admitting: Vascular Surgery

## 2013-07-26 DIAGNOSIS — R5381 Other malaise: Secondary | ICD-10-CM

## 2013-07-26 DIAGNOSIS — D649 Anemia, unspecified: Secondary | ICD-10-CM

## 2013-07-26 DIAGNOSIS — E872 Acidosis, unspecified: Secondary | ICD-10-CM

## 2013-07-26 LAB — TYPE AND SCREEN
ABO/RH(D): O POS
Antibody Screen: POSITIVE
DAT, IgG: NEGATIVE
Unit division: 0

## 2013-07-26 LAB — CBC
HEMATOCRIT: 23 % — AB (ref 36.0–46.0)
HEMOGLOBIN: 7.8 g/dL — AB (ref 12.0–15.0)
MCH: 32.1 pg (ref 26.0–34.0)
MCHC: 33.9 g/dL (ref 30.0–36.0)
MCV: 94.7 fL (ref 78.0–100.0)
Platelets: 210 10*3/uL (ref 150–400)
RBC: 2.43 MIL/uL — AB (ref 3.87–5.11)
RDW: 22.7 % — ABNORMAL HIGH (ref 11.5–15.5)
WBC: 7.3 10*3/uL (ref 4.0–10.5)

## 2013-07-26 LAB — CULTURE, BLOOD (ROUTINE X 2)
Culture: NO GROWTH
Culture: NO GROWTH

## 2013-07-26 LAB — GLUCOSE, CAPILLARY
Glucose-Capillary: 80 mg/dL (ref 70–99)
Glucose-Capillary: 138 mg/dL — ABNORMAL HIGH (ref 70–99)
Glucose-Capillary: 183 mg/dL — ABNORMAL HIGH (ref 70–99)
Glucose-Capillary: 118 mg/dL — ABNORMAL HIGH (ref 70–99)

## 2013-07-26 MED ORDER — DIPHENHYDRAMINE HCL 25 MG PO CAPS: 25.0000 mg | ORAL_CAPSULE | Freq: Four times a day (QID) | ORAL | Status: DC | PRN

## 2013-07-26 NOTE — Progress Notes (Addendum)
INTERNAL MEDICINE TEACHING SERVICE DAILY PROGRESS NOTE Subjective: Patient received 1 unite PRBC yesterday, Hgb stable this AM. Patient is complaining of some generalized pruritis, But reports this is chronic and not knew (only new medication since yesterday was Keflex).  She reports she takes benadryl when then happens at home and is satisfied with receiving benadryl here. Patient refused to work with PT yesterday. Is a little concerned about going home.  I did speak with her Aunt (she calls her "Mom," as she raised her) she reports that her 2 sisters will check in on her at home frequently. Objective: Vital signs in last 24 hours: Filed Vitals:   07/25/13 1514 07/25/13 2121 07/26/13 0556 07/26/13 1001  BP: 153/69 149/72 125/70 124/68  Pulse: 100 95 95 95  Temp: 100.4 F (38 C) 98.6 F (37 C) 101.2 F (38.4 C)   TempSrc: Oral Oral Oral   Resp: 18 18 18    Height:      Weight:   183 lb 6.8 oz (83.2 kg)   SpO2: 95% 100% 100%    Weight change: -2 lb 6.8 oz (-1.1 kg)  Intake/Output Summary (Last 24 hours) at 07/26/13 1107 Last data filed at 07/26/13 0930  Gross per 24 hour  Intake   1080 ml  Output   2200 ml  Net  -1120 ml    General: Vital signs reviewed. Resting comfortably in bed, NAD Lungs: Clear to Auscultation Bilaterally Heart: RRR; no extra sounds or murmurs  Abdomen: Bowel sounds present, soft, nontender; no hepatosplenomegaly  Extremities: no lower extremity edema, healed lower extremity wounds Neurologic: AAOx3  Lab Results: Basic Metabolic Panel:  Recent Labs Lab 07/19/13 1700 07/20/13 0612  07/22/13 0335  07/24/13 0126 07/25/13 0450  NA 138 132*  < > 140  < > 138 137  K 3.8 4.1  < > 3.4*  < > 3.3* 3.6*  CL 98 94*  < > 97  < > 96 98  CO2 22 23  < > 32  < > 27 25  GLUCOSE 118* 409*  < > 43*  < > 105* 122*  BUN 108* 94*  < > 66*  < > 47* 48*  CREATININE 3.63* 3.05*  < > 2.59*  < > 2.40* 2.60*  CALCIUM 8.0* 7.4*  < > 7.8*  < > 7.9* 7.2*  MG  --   --   --   1.8  --   --   --   PHOS 4.9* 3.0  --   --   --   --   --   < > = values in this interval not displayed. Liver Function Tests:  Recent Labs Lab 07/20/13 0612 07/22/13 0335 07/23/13 1036  AST 38*  --   --   ALT 26  --   --   ALKPHOS 234*  --   --   BILITOT 9.4* 7.0*  --   PROT 5.2*  --   --   ALBUMIN 1.3*  1.3*  --  1.3*   CBC:  Recent Labs Lab 07/20/13 0612  07/25/13 0450 07/25/13 1555 07/26/13 0308  WBC 9.8  < > 6.5  --  7.3  NEUTROABS 7.1  --   --   --   --   HGB 7.1*  < > 6.5* 8.1* 7.8*  HCT 21.0*  < > 19.4* 23.7* 23.0*  MCV 99.1  < > 98.5  --  94.7  PLT 127*  < > 175  --  210  < > =  values in this interval not displayed. Cardiac Enzymes: No results found for this basename: CKTOTAL, CKMB, CKMBINDEX, TROPONINI,  in the last 168 hours CBG:  Recent Labs Lab 07/24/13 2120 07/25/13 0613 07/25/13 1109 07/25/13 1603 07/25/13 2116 07/26/13 0553  GLUCAP 150* 135* 186* 149* 133* 80   Anemia Panel:  Recent Labs Lab 07/24/13 0126  RETICCTPCT 1.7   Urine Drug Screen: Drugs of Abuse    Urinalysis: No results found for this basename: COLORURINE, APPERANCEUR, LABSPEC, PHURINE, GLUCOSEU, HGBUR, BILIRUBINUR, KETONESUR, PROTEINUR, UROBILINOGEN, NITRITE, LEUKOCYTESUR,  in the last 168 hours Micro Results: Recent Results (from the past 240 hour(s))  CULTURE, BLOOD (ROUTINE X 2)     Status: None   Collection Time    07/19/13 12:30 AM      Result Value Ref Range Status   Specimen Description BLOOD RIGHT HAND   Final   Special Requests BOTTLES DRAWN AEROBIC AND ANAEROBIC [facility] EACH   Final   Culture  Setup Time     Final   Value: 07/19/2013 08:47     Performed at [facility]   Culture     Final   Value: ESCHERICHIA COLI     Note: Gram Stain Report Called to,Read Back By and Verified With: Sherolyn Buba 07/19/13 AT 8:22PM THOMI     Performed at [facility]   Report Status 07/21/2013 FINAL   Final   Organism ID, Bacteria ESCHERICHIA COLI   Final   MRSA PCR SCREENING     Status: Abnormal   Collection Time    07/19/13  3:52 AM      Result Value Ref Range Status   MRSA by PCR POSITIVE (*) NEGATIVE Final   Comment:            The GeneXpert MRSA Assay (FDA     approved for NASAL specimens     only), is one component of a     comprehensive MRSA colonization     surveillance program. It is not     intended to diagnose MRSA     infection nor to guide or     monitor treatment for     MRSA infections.     RESULT CALLED TO, READ BACK BY AND VERIFIED WITH:     C PORTER,RN 07/19/13 0827 BY K SCHULTZ  CULTURE, BLOOD (ROUTINE X 2)     Status: None   Collection Time    07/19/13 11:40 PM      Result Value Ref Range Status   Specimen Description BLOOD RIGHT ARM   Final   Special Requests BOTTLES DRAWN AEROBIC AND ANAEROBIC 5CC EACH   Final   Culture  Setup Time     Final   Value: 07/20/2013 03:34     Performed at [facility]   Culture     Final   Value:        BLOOD CULTURE RECEIVED NO GROWTH TO DATE CULTURE WILL BE HELD FOR 5 DAYS BEFORE ISSUING A FINAL NEGATIVE REPORT     Performed at [facility]   Report Status PENDING   Incomplete  CULTURE, BLOOD (ROUTINE X 2)     Status: None   Collection Time    07/19/13 11:57 PM      Result Value Ref Range Status   Specimen Description BLOOD RIGHT ARM   Final   Special Requests BOTTLES DRAWN AEROBIC AND ANAEROBIC 5CC EACH   Final   Culture  Setup Time  Final   Value: 07/20/2013 03:36     Performed at [facility]   Culture     Final   Value:        BLOOD CULTURE RECEIVED NO GROWTH TO DATE CULTURE WILL BE HELD FOR 5 DAYS BEFORE ISSUING A FINAL NEGATIVE REPORT     Performed at [facility]   Report Status PENDING   Incomplete  CLOSTRIDIUM DIFFICILE BY PCR     Status: None   Collection Time    07/20/13 11:18 AM      Result Value Ref Range Status   C difficile by pcr NEGATIVE  NEGATIVE Final   Studies/Results: No results found. Medications: I have  reviewed the patient's current medications. Scheduled Meds: . abacavir  600 mg Oral Daily  . acetaminophen  650 mg Oral Once  . antiseptic oral rinse  15 mL Mouth Rinse BID  . aspirin EC  81 mg Oral Daily  . cephALEXin  500 mg Oral Q12H  . Darunavir Ethanolate  800 mg Oral Q breakfast  . furosemide  160 mg Oral BID  . heparin  5,000 Units Subcutaneous 3 times per day  . hydrALAZINE  50 mg Oral TID  . insulin aspart  0-20 Units Subcutaneous TID WC  . insulin glargine  50 Units Subcutaneous Daily  . labetalol  200 mg Oral BID  . lamiVUDine  100 mg Oral Daily  . levETIRAcetam  500 mg Oral Q12H  . LORazepam  1 mg Intravenous QHS  . pantoprazole  40 mg Oral Daily  . phenytoin  200 mg Oral BID   And  . phenytoin  30 mg Oral BID  . ritonavir  100 mg Oral Q breakfast  . sodium chloride  3 mL Intravenous Q12H   Continuous Infusions:   PRN Meds:.acetaminophen, diphenhydrAMINE, LORazepam, ondansetron (ZOFRAN) IV, ondansetron, oxyCODONE Assessment/Plan: # Sepsis E coli Bacteremia mostly likely secondary Pylenephritis: Blood culture 1/2 on 07/19/2013 revealed E coli. resistant to Ampicillin, Bactrim. Patient with +. Repeat Blood Cultures negative  Patient continues to have some intermittent mild fevers. Plan  - Will continue Keflex on discharge will treat for additional 6 days.  # Seizure disorder -  -Neurology signed off - Avoid fluroquinolones - New Keppra dose 500mg  BID due to renal insufficieny will need to increase if GFR >30 - New dilantin dose 230 BID, will need level repeated at hospital followup  # Acute Kidney Injury on CKD - Improving. Biopsy showed collapsing FSGS. Patient Creatinine almost at baseline. - Will need outpatient follow up scheduled outpatient graft placement in a few weeks.   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 Mild drop yesterday repeat studies reassuring that this is not due to AIHA, And Hgb this AM down to 6.5 patient did undergo  right antecubital exploration yesterday. - Monitored Hgb overnight, stable.  # Diabetes: well controlled - Continue Lantus to 50 units  -SSI-R - Carb mod diet  # Hypertension : BP better controlled On home clonidine, lasix, labetalol, lisinopril, hydralazine. Initially BP was low but currently elevated.  - Labetalol 200mg  BID - hydralazine 50mg  TID - Lasix 160 BID -Will restart lisinopril on discharge  # HIV - last CD4 = 490 (05/07/13)  -continue abacavir, prezista, lamivudine (renal adjust to 100mg ), norvir  # Increased Anion Gap Metabolic Acidosis resolved:  Secondary to uremia, associated with acute on chronic renal insufficiency.   #Deconditioning -Will have PT/ OT re-eval patient.  # Diarrhea: resolved  Diet: Carb  mod Dispo: Discharge home today  The patient does have a current PCP Burtis Junes, Arna Medici, MD), therefore will be requiring OPC follow-up after discharge.   The patient does not have transportation limitations that hinder transportation to clinic appointments.  .Services Needed at time of discharge: Y = Yes, Blank = No PT: y (resume HH)  OT:   RN:   Equipment:   Other:     LOS: 8 days   Gust Rung PGY 1 - Internal Medicine Teaching Service Pager: [phone]  07/26/2013, 11:07 AM

## 2013-07-26 NOTE — Progress Notes (Signed)
Pt stable, VSS.  Pt received 25 mg of benadryl for itching at 0943.  Pt scratched skin and created two new skin tears on the left leg and the left foot.  The tears were dressed with foam dressings and wrapped with kerlex to prevent further damage from scratching.

## 2013-07-26 NOTE — Progress Notes (Signed)
I co-sign Michelle [name] notes and assessments 

## 2013-07-26 NOTE — Discharge Summary (Signed)
Name: [name] MRN: [id number] DOB: 13-Sep-1960 53 y.o. PCP: Christen Bame, MD  Date of Admission: 07/18/2013 10:27 PM Date of Discharge: 07/27/2013 Attending Physician: Gardiner Barefoot, MD  Discharge Diagnosis: Principal Problem:   Acute on chronic renal failure Active Problems:   Autoimmune hemolytic anemia   Diabetes mellitus without complication   HIV (human immunodeficiency virus infection)   Hypertension   Uremia syndrome   E coli bacteremia   Diarrhea   Possible HAP  Discharge Medications:   Medication List    STOP taking these medications       cloNIDine 0.2 MG tablet  Commonly known as:  CATAPRES     phenytoin 50 MG tablet  Commonly known as:  DILANTIN      TAKE these medications       abacavir 300 MG tablet  Commonly known as:  ZIAGEN  Take 2 tablets (600 mg total) by mouth daily.     acyclovir 800 MG tablet  Commonly known as:  ZOVIRAX  Take 1 tablet (800 mg total) by mouth daily.     aspirin EC 81 MG tablet  Take 1 tablet (81 mg total) by mouth daily.     cephALEXin 500 MG capsule  Commonly known as:  KEFLEX  Take 1 capsule (500 mg total) by mouth every 12 (twelve) hours.     cholecalciferol 1000 UNITS tablet  Commonly known as:  VITAMIN D  Take 1,000 Units by mouth daily.     Darunavir Ethanolate 800 MG tablet  Commonly known as:  PREZISTA  Take 1 tablet (800 mg total) by mouth daily with breakfast.     diphenhydrAMINE 25 mg capsule  Commonly known as:  BENADRYL  Take 1 capsule (25 mg total) by mouth every 6 (six) hours as needed for itching.     furosemide 80 MG tablet  Commonly known as:  LASIX  Take 2 tablets (160 mg total) by mouth 2 (two) times daily.     gentamicin 0.3 % ophthalmic ointment  Commonly known as:  GARAMYCIN  Place 1 application into both eyes 4 (four) times daily.     glucose blood test strip  Commonly known as:  ACCU-CHEK AVIVA  Use as instructed     hydrALAZINE 50 MG tablet  Commonly known as:  APRESOLINE    Take 50 mg by mouth 3 (three) times daily.     insulin aspart 100 UNIT/ML injection  Commonly known as:  novoLOG  Inject into the skin 3 (three) times daily before meals. Home Sliding scale     insulin glargine 100 UNIT/ML injection  Commonly known as:  LANTUS  Inject 0.5 mLs (50 Units total) into the skin at bedtime.     labetalol 200 MG tablet  Commonly known as:  NORMODYNE  Take 1 tablet (200 mg total) by mouth 2 (two) times daily.     lamiVUDine 150 MG tablet  Commonly known as:  EPIVIR  Take 1 tablet (150 mg total) by mouth daily.     levETIRAcetam 500 MG tablet  Commonly known as:  KEPPRA  Take 1 tablet (500 mg total) by mouth every 12 (twelve) hours.     lisinopril 20 MG tablet  Commonly known as:  PRINIVIL,ZESTRIL  Take 1 tablet (20 mg total) by mouth daily.     LORazepam 1 MG tablet  Commonly known as:  ATIVAN  Take 1 mg by mouth daily. Take one tablet for seizure prevention.     methocarbamol 500  MG tablet  Commonly known as:  ROBAXIN  Take 500 mg by mouth every 6 (six) hours as needed for muscle spasms.     NORVIR 100 MG Tabs tablet  Generic drug:  ritonavir  Take 100 mg by mouth daily with breakfast.     Oxycodone HCl 10 MG Tabs  Take 1 tablet (10 mg total) by mouth every 6 (six) hours as needed (severe pain).     pantoprazole 40 MG tablet  Commonly known as:  PROTONIX  Take 1 tablet (40 mg total) by mouth daily.     phenytoin 200 MG ER capsule  Commonly known as:  DILANTIN  Take 1 capsule (200 mg total) by mouth 2 (two) times daily.     phenytoin 30 MG ER capsule  Commonly known as:  DILANTIN  Take 1 capsule (30 mg total) by mouth 2 (two) times daily.        Disposition and follow-up:   Michelle [name] was discharged from [facility] in Stable condition.  At the hospital follow up visit please address:  1. Has fever resolved? Patient discharged with additional 6 day course of Keflex.  2. Please review her home glucose  reading and make adjustments to her as needed in her insulin (A1c not accurate given hemolytic anemia, multiple transfuions)  3. Participation with Home health PT  4. Weight (discharge weight 183)  5. Improvement in puritis (patient had lower extremity leg wounds from scratching on previous admission)  6. Please review patient's medications as she has had multiple changes due to multiple hospitalizations recently (Keppra decreased to 500mg  BID, Dilantin 230mg  BID)  7. Patient needs to follow up with Dr. Marisue Humble (Nephrology) and eventually with Dr. Darrick Penna ([facility]) for future dialysis access  8.  Labs / imaging needed at time of follow-up: CBC, CMP (check K, Cr, Albumin, Bilirubin), Dilantin level (adjust for albumin)  9.  Pending labs/ test needing follow-up: None  Follow-up Appointments: Follow-up Information   Follow up with Christen Bame, MD On 08/02/2013. (at 1:15pm)    Specialty:  Internal Medicine   Contact information:   [address] [place] [address] [phone]       Follow up with Sabra Heck, Leonard Schwartz, MD On 07/31/2013. (at 11:30 am)    Specialty:  Nephrology   Contact information:   [address] [place] [address] [phone]       Discharge Instructions: Discharge Orders   Future Appointments Provider Department Dept Phone   08/02/2013 1:15 PM Christen Bame, MD [facility] [phone]   08/05/2013 10:00 AM [facility] [phone]   08/05/2013 10:30 AM [facility] Covering Provider 1 [facility] [phone]   08/15/2013 2:30 PM [facility] [place] [facility] [address] [phone]   08/15/2013 3:15 PM Sherren Kerns, MD [facility] [phone]   Future Orders Complete By Expires   Diet - low sodium heart healthy  As directed    Increase activity slowly  As directed       Consultations: Treatment Team:  Irena Cords, MD Garnetta Buddy, MD  Procedures Performed:  Dg Chest 2 View  07/05/2013   CLINICAL DATA:  53 year old female chest pain and cough  EXAM: CHEST  2 VIEW  COMPARISON:  DG CHEST 2 VIEW dated 07/02/2013; DG CHEST 1V PORT dated 05/10/2013; DG CHEST 2 VIEW dated 06/18/2013  FINDINGS: The cardiomediastinal silhouette  is unremarkable.  Mild peribronchial thickening again noted.  There is no evidence of focal airspace disease, pulmonary edema, suspicious pulmonary nodule/mass, pleural effusion, or pneumothorax. No acute bony abnormalities are identified.  IMPRESSION: No active cardiopulmonary disease.   Electronically Signed   By: Laveda Abbe M.D.   On: 07/05/2013 21:38   Dg Chest 2 View  07/02/2013   CLINICAL DATA:  Chest pain, shortness of breath, and cough.  EXAM: CHEST  2 VIEW  COMPARISON:  06/18/2013  FINDINGS: Heart size and pulmonary vascularity are normal. There is peribronchial thickening as well as a nodule in the left lower lobe, unchanged. There is slight scarring or atelectasis in the left lower lobe. This is also unchanged. No consolidative infiltrates or effusions. No acute osseous abnormality.  IMPRESSION: Inflammatory changes as described, unchanged since the prior exam.   Electronically Signed   By: Geanie Cooley M.D.   On: 07/02/2013 18:24   Ct Head Wo Contrast  07/09/2013   CLINICAL DATA:  Confusion  EXAM: CT HEAD WITHOUT CONTRAST  TECHNIQUE: Contiguous axial images were obtained from the base of the skull through the vertex without intravenous contrast.  COMPARISON:  May 28, 2013  FINDINGS: Moderate generalized atrophy is stable. There is no mass, hemorrhage, extra-axial fluid collection, or midline shift. There is patchy small vessel disease in the centra semiovale bilaterally. Gray-white compartments are otherwise normal. No demonstrable acute infarct. Bony calvarium appears intact. The mastoid air cells are clear. There is mild debris in the left external auditory canal.   IMPRESSION: Atrophy with patchy periventricular small vessel disease. Probable cerumen in the left external auditory canal. Study otherwise unremarkable.   Electronically Signed   By: Bretta Bang M.D.   On: 07/09/2013 11:13   Mr Brain Wo Contrast  07/09/2013   CLINICAL DATA:  Acute encephalopathy.  Rule out stroke.  EXAM: MRI HEAD WITHOUT CONTRAST  TECHNIQUE: Multiplanar, multiecho pulse sequences of the brain and surrounding structures were obtained without intravenous contrast.  COMPARISON:  Head CT 07/09/2013 and brain MRI 05/10/2013  FINDINGS: Mildly heterogeneous marrow signal in the clivus is unchanged. There is no evidence of acute infarct. Patchy T2 hyperintensities within the subcortical and deep cerebral white matter do not appear significantly changed and are nonspecific but compatible with mild chronic small vessel ischemic disease. There is no evidence of intracranial hemorrhage. There is moderate cerebral atrophy, mildly advanced for age and unchanged. There is no evidence of mass, midline shift, or extra-axial fluid collection. Major intracranial vascular flow voids are unremarkable. Orbits are normal. Paranasal sinuses and mastoid air cells are clear.  IMPRESSION: Unchanged appearance of the brain with mild chronic small vessel ischemic disease and mildly advanced cerebral atrophy for age. No evidence of acute intracranial abnormality.   Electronically Signed   By: Sebastian Ache   On: 07/09/2013 19:02   US Abdomen Complete  07/19/2013   CLINICAL DATA:  Right upper quadrant abdominal tenderness, renal failure.  EXAM: ULTRASOUND ABDOMEN COMPLETE  COMPARISON:  None.  FINDINGS: Gallbladder:  Sludge is noted within the gallbladder lumen. No definite stones or gallbladder wall thickening is noted. No sonographic Murphy's sign is noted.  Common bile duct:  Diameter: Measures 2.4 mm which is within normal limits.  Liver:  Contours are slightly nodular suggesting possible hepatic cirrhosis. No  focal abnormality is noted.  IVC:  No abnormality visualized.  Pancreas:  Visualized portion unremarkable.  Spleen:  Size and appearance within normal limits.  Right Kidney:  Length: 12.2 cm.  No mass or hydronephrosis is noted. Increased echogenicity of renal parenchyma is noted suggesting medical renal disease.  Left Kidney:  Length: 12.7 cm. Kidney is not well visualized due to overlying bowel gas.  Abdominal aorta:  No aneurysm visualized.  Other findings:  None.  IMPRESSION: Sludge is noted within the gallbladder lumen.  Hepatic contours are slightly nodular suggesting the possibility of hepatic cirrhosis. Clinical correlation is recommended.  Increased echogenicity of renal parenchyma is noted suggesting medical renal disease.   Electronically Signed   By: Roque Lias M.D.   On: 07/19/2013 16:00   US Biopsy  07/08/2013   INDICATION: History of HIV, hepatitis-C, diabetes, now with acute on chronic renal insufficiency and nephrotic syndrome  EXAM: ULTRASOUND GUIDED RENAL BIOPSY  MEDICATIONS: Fentanyl 50 mcg IV; Versed 1 mg IV  ANESTHESIA/SEDATION: Total Moderate Sedation time  15 minutes  COMPARISON:  CT CHEST W/O CM dated 06/19/2013; US ABDOMEN COMPLETE dated 06/19/2013  PROCEDURE: Informed written consent was obtained from the patient after a discussion of the risks, benefits and alternatives to treatment. The patient understands and consents the procedure. A timeout was performed prior to the initiation of the procedure.  Ultrasound scanning was performed of the bilateral flanks. The inferior pole of the left kidney was selected for biopsy due to location and sonographic window. The procedure was planned. The operative site was prepped and draped in the usual sterile fashion. The overlying soft tissues were anesthetized with 1% lidocaine with epinephrine. A 17 gauge core needle biopsy device was advanced into the inferior cortex of the left kidney and 3 core biopsies were obtained under direct ultrasound  guidance. Real time pathologic review confirmed adequate tissue acquisition. Images were saved for documentation purposes. The biopsy device was removed and hemostasis was obtained with manual compression. Post procedural scanning was negative for significant post procedural hemorrhage or additional complication. A dressing was placed. The patient tolerated the procedure well without immediate post procedural complication.  COMPLICATIONS: None immediate  IMPRESSION: Technically successful ultrasound guided left renal biopsy.   Electronically Signed   By: Simonne Come M.D.   On: 07/08/2013 16:36   Dg Chest Port 1 View  07/19/2013   CLINICAL DATA:  Fever.  Altered mental status.  EXAM: PORTABLE CHEST - 1 VIEW  COMPARISON:  07/05/2013  FINDINGS: Increasing opacity at the right lung base medially concerning for pneumonia. Left lung is clear. Heart is normal size. No effusions or acute bony abnormality.  IMPRESSION: Right medial basilar airspace opacity concerning for pneumonia.   Electronically Signed   By: Charlett Nose M.D.   On: 07/19/2013 00:16    Admission HPI: Michelle [name] is a 53 year old woman with past medical history of dCHF (EF normal, with Grade-2 diastolic dysfunction), HIV (CD4 490 and VL<20 on 05/07/13), chronic HCV, seizure disorder, hx of stroke, HTN, insulin dependent Type II DM, CKD, gout, and AIHA who presents with altered mental status.  History obtained via telephone from her cousin. Patient was recently hospitalized from 2/6-2/12 for nephrotic syndrome and was doing well until Monday night when she was reported to have a seizure and also again on Tuesday night. Per cousin it was questionable if she was correctly taking her medications this past week (including seizure medications). She was receiving her insulin regimen but with high blood glucose levels on Monday (>600) and again today since she reportably did not take it last night or today. She was taking her prednisone taper that was  prescribed on hospital discharge for  AIHA. She has had good PO intake but with somnolence, chills, polydipsia, and polyuria. She also had a recent fall as her cousin found her fallen from the bed today with no reported head injury. No recent alcohol or drug use. She takes oxycodone as needed for pain but she does not suspect she was taking more than normal.    Hospital Course by problem list: # Sepsis E coli Bacteremia mostly likely secondary Pylenephritis:  Patient presented with fever, tachycardia, tachypnea, hyperbilirubinemia, and hypotension.  Initially there was concern for possible infectious sources of pneumonia, pyelonephritis (CVA tenderness on left) and cholecystitis (RUQ pain, hyperbilirubinemia). A abdominal ultrasound was competed which showed no evidence of cholecystitis. 1 of 2 Blood culture on 07/19/2013 revealed E coli. resistant to Ampicillin, Bactrim.  She was initially treated with empiric antibiotics for HCAP, however this was narrowed on day 2 to levofloxacin, she subsequently developed a seizure and antibiotics were changed to cephazolin. Repeat Blood Cultures negative. She continued to have some intermittent mild fevers, but her leukocytosis resolved.  She was transitioned to PO Keflex. She was discharged with PO Keflex for additional 6 days.  # Seizure disorder -  Patient his a history of seizure disorder, she was initially continued on her home dose of Keppra and Dilantin. On 07/21/13 she developed had a seizure with shaking of her RUE and RLE. She was given ativan to break the seizure.  Labs at the time revealed her dilantin was subtheraputic.  Neurology was consulted and recommended changing antibiotics from Levaquin as it may lower seizure threshold.  Her dilantin dose was increased by 30mg .  She will need her dilantin level redrawn at hospital follow up.  Of note her Keppra dose was decreased to 500mg  BID due to her decreased renal function.  # Acute Kidney Injury on CKD -   Patient presented septic with a creatinine of 4.6 from a previous discharge Cr 2.4 one week prior.  Her infection was treated and she was given careful IV hydration.  Her creatinine trended down to 2.4-2.6 on discharge.  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 Nephrology was consulted during this admission and estimated she will likely need dialysis in 6 months to 1 year.  It was recommended to establish access during this admission.  [facility] was consulted and she was taken for AVF creation in her right forearm on 07/24/13 however no veins were suitable candidates.  Given her recent infection and intermittent fevers a graft was not placed on this admission.  She is to follow up with [facility] for future graft placement as outpatient.  # Anemia with Hx Autoimmune Hemolytic Anemia -  Patient continued to have anemia on this admission, she was transfused a total of 2 units of PRBC during this admission.  Once at admission and again on 07/24/13 workup for possible hemolytic anemia was completed which was negative for active hemolysis.  She was not given steroids during this admission as she has already finished her steroid taper form her AIHA treatment due to Ceftriaxone.  Her Hgb on discharge was 7.8.  # Diabetes:  Her diabetes was well controlled with Lantus 50units QHS, with SSI- Resistant scale.  # Hypertension :  Her home medications included clonidine, lasix, labetalol, lisinopril, hydralazine. Initially her medications were held due to hypotension in the setting of sepsis, these were gradually reintroduced.  Clonidine was discontinued and labetalol was increased to 200mg  BID.  Lisinopril to be resumed on discharge.  # HIV -  Well controlled with  last CD4 = 490 (05/07/13).  We continued abacavir, prezista, lamivudine (renal adjust to 100mg ), norvir.   # Increased Anion Gap Metabolic Acidosis She initially presented with AGMA. This was attributed econdary to uremia, due to acute on chronic renal  insufficiency. With improvement in her renal function this resolved.  #Deconditioning  PT revaluated patient and due to her multiple prolonged hospital stays she was deemed to be very deconditioned.  They recommended SNF placement.   Discharge Vitals:   BP 143/77  Pulse 93  Temp(Src) 97.9 F (36.6 C) (Oral)  Resp 18  Ht 5\' 5"  (1.651 m)  Wt 182 lb 4.8 oz (82.691 kg)  BMI 30.34 kg/m2  SpO2 100%  Discharge Labs:  Results for orders placed during the hospital encounter of 07/18/13 (from the past 24 hour(s))  GLUCOSE, CAPILLARY     Status: Abnormal   Collection Time    07/26/13 11:08 AM      Result Value Ref Range   Glucose-Capillary 138 (*) 70 - 99 mg/dL   Comment 1 Notify RN    GLUCOSE, CAPILLARY     Status: Abnormal   Collection Time    07/26/13  4:02 PM      Result Value Ref Range   Glucose-Capillary 183 (*) 70 - 99 mg/dL   Comment 1 Notify RN    GLUCOSE, CAPILLARY     Status: Abnormal   Collection Time    07/26/13  9:04 PM      Result Value Ref Range   Glucose-Capillary 118 (*) 70 - 99 mg/dL  GLUCOSE, CAPILLARY     Status: None   Collection Time    07/27/13  6:09 AM      Result Value Ref Range   Glucose-Capillary 86  70 - 99 mg/dL    Signed: Christen Bame, MD 07/27/2013, 9:52 AM   Time Spent on Discharge: 45 minutes Services Ordered on Discharge: HHPT/OT/RN Equipment Ordered on Discharge: none

## 2013-07-26 NOTE — Progress Notes (Signed)
UR completed Bryce Kimble K. Kermit Arnette, RN, BSN, MSHL, CCM  07/26/2013 11:59 AM

## 2013-07-26 NOTE — Evaluation (Signed)
Physical Therapy Evaluation Patient Details Name: Michelle [name] MRN: [id number] DOB: 10/02/1960 Today's Date: 07/26/2013 Time: 5320-2334 PT Time Calculation (min): 18 min  PT Assessment / Plan / Recommendation History of Present Illness  This is a 53 year old woman with PMH of CHF (EF normal, with Grade-2 diastolic dysfunction), HIV, seizure disorder, hx of stroke, HTN, T2DM, CKD, gout, who was admitted with sepsis and seizure.  Also Hgb 6.5.    Clinical Impression  Pt admitted with above. Pt currently with functional limitations due to the deficits listed below (see PT Problem List). Pt will benefit from skilled PT to increase their independence and safety with mobility to allow discharge to the venue listed below.     PT Assessment  Patient needs continued PT services    Follow Up Recommendations  SNF;Supervision/Assistance - 24 hour    Does the patient have the potential to tolerate intense rehabilitation      Barriers to Discharge Decreased caregiver support      Equipment Recommendations  3in1 (PT)    Recommendations for Other Services     Frequency Min 3X/week    Precautions / Restrictions Precautions Precautions: Fall Precaution Comments: reports last fall months ago Restrictions Weight Bearing Restrictions: No   Pertinent Vitals/Pain VSS, Pain right UE but not rated.      Mobility  Bed Mobility Overal bed mobility: Modified Independent Transfers Overall transfer level: Needs assistance Transfers: Sit to/from Stand Sit to Stand: Supervision General transfer comment: wide base of support and incr time Ambulation/Gait Ambulation/Gait assistance: Min assist;Mod assist Ambulation Distance (Feet): 20 Feet Assistive device: None Gait Pattern/deviations: Decreased stride length;Shuffle;Ataxic;Wide base of support Gait velocity interpretation: Below normal speed for age/gender General Gait Details: Pt did not have device but needed HHA for stability.  Pt not  grossly unsteady but was unsteady overall needing min guard assist for balance.      Exercises General Exercises - Lower Extremity Ankle Circles/Pumps: AROM;Both;10 reps;Seated Long Arc Quad: AROM;Both;10 reps;Seated   PT Diagnosis: Generalized weakness  PT Problem List: Decreased activity tolerance;Decreased balance;Decreased mobility;Decreased knowledge of use of DME;Decreased safety awareness;Decreased knowledge of precautions PT Treatment Interventions: DME instruction;Gait training;Therapeutic activities;Functional mobility training;Therapeutic exercise;Balance training;Patient/family education     PT Goals(Current goals can be found in the care plan section) Acute Rehab PT Goals Patient Stated Goal: to go to NH for therapy PT Goal Formulation: With patient Time For Goal Achievement: 08/02/13 Potential to Achieve Goals: Good  Visit Information  Last PT Received On: 07/26/13 Assistance Needed: +1 History of Present Illness: This is a 53 year old woman with PMH of CHF (EF normal, with Grade-2 diastolic dysfunction), HIV, seizure disorder, hx of stroke, HTN, T2DM, CKD, gout, who was admitted with sepsis and seizure.  Also Hgb 6.5.         Prior Functioning  Home Living Family/patient expects to be discharged to:: Private residence Living Arrangements: Alone Available Help at Discharge: Family;Available PRN/intermittently Type of Home: Apartment Home Access: Level entry Home Layout: One level Home Equipment: Cane - single point;Grab bars - tub/shower;Grab bars - toilet;Walker - standard;Walker - 2 wheels Additional Comments: cousin comes in every day about 4 hours to assist and hopes to get paid as PCA  Lives With: Alone Prior Function Level of Independence: Needs assistance Gait / Transfers Assistance Needed: uses device at times (especially when gout acting up) uses motorized scooters at store (when available) ADL's / Homemaking Assistance Needed: cousin assists with bath  and meals and cleaning Comments:  pt has family member come in every other day to help pt bath, clean house and cook but pt feels like this is not enough any longer Communication Communication: No difficulties Dominant Hand: Right    Cognition  Cognition Arousal/Alertness: Awake/alert Behavior During Therapy: WFL for tasks assessed/performed Overall Cognitive Status: No family/caregiver present to determine baseline cognitive functioning (slow to process information)    Extremity/Trunk Assessment Upper Extremity Assessment Upper Extremity Assessment: Defer to OT evaluation Lower Extremity Assessment Lower Extremity Assessment: RLE deficits/detail;LLE deficits/detail RLE Deficits / Details: edema and numbness in feet; strength hip flexion 3-/5, knee extension 4/5, ankle DF 4+/5 LLE Deficits / Details: edema and numbness in feet; strength hip flexion 3-/5, knee extension 4/5, ankle DF 4+/5 Cervical / Trunk Assessment Cervical / Trunk Assessment: Kyphotic   Balance Balance Overall balance assessment: Needs assistance;History of Falls Sitting-balance support: No upper extremity supported;Feet supported Sitting balance-Leahy Scale: Good Postural control: Posterior lean Standing balance support: Bilateral upper extremity supported;During functional activity Standing balance-Leahy Scale: Poor Standing balance comment: Pt unsteady statically and dynamically needing HHA for support  End of Session PT - End of Session Equipment Utilized During Treatment: Gait belt Activity Tolerance: Patient limited by fatigue Patient left: in chair;with call bell/phone within reach Nurse Communication: Mobility status  GP     INGOLD,Clotine Heiner 07/26/2013, 1:57 PM [facility] Center[facility] In[facility],PT Acute Rehabilitation [phone] (pager)

## 2013-07-26 NOTE — Progress Notes (Signed)
MEDICATION RELATED CONSULT NOTE - FOLLOW UP   Pharmacy Consult for dilantin Indication: seizure  Allergies  Allergen Reactions  . 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 Norvasc [Amlodipine Besylate]     Itching, rash , hives .   [name] Morphine And Related Hives, Itching and Rash  Patient Measurements: Height: 5\' 5"  (165.1 cm) Weight: 183 lb 6.8 oz (83.2 kg) (scale B) IBW/kg (Calculated) : 57 Vital Signs: Temp: 101.2 F (38.4 C) (02/27 0556) Temp src: Oral (02/27 0556) BP: 125/70 mmHg (02/27 0556) Pulse Rate: 95 (02/27 0556) Intake/Output from previous day: 02/26 0701 - 02/27 0700 In: 1092.5 [P.O.:1080; Blood:12.5] Out: 2400 [Urine:2400] Intake/Output from this shift:   Labs:  Recent Labs  07/23/13 1036  07/24/13 0126 07/25/13 0450 07/25/13 1555 07/26/13 0308  WBC  --   < > 7.6 6.5  --  7.3  HGB  --   < > 7.4* 6.5* 8.1* 7.8*  HCT  --   < > 21.8* 19.4* 23.7* 23.0*  PLT  --   < > 170 175  --  210  CREATININE  --   --  2.40* 2.60*  --   --   ALBUMIN 1.3*  --   --   --   --   --   < > = values in this interval not displayed. Lab Results  Component Value Date   PHENYTOIN 4.0* 07/23/2013    Estimated Creatinine Clearance: 26.4 ml/min (by C-G formula based on Cr of 2.6).   Assessment: 30 YOF admitted with seizure like activity on dilantin and Keppra PTA in the setting of fluoroquinolone use.   Free dilantin level was drawn due to decreased renal function and low albumin.  Level was 1.3 with total level 3.2.  Unfortunately this was not at steady state but it is reassuring that free level is therapeutic.   No further seizure activity reported.   Goal of Therapy:  Free dilantin level of 1-2 Total corrected dilantin level of 10-20  Plan:  Continue current dilantin dose as recommend by neurology. Recheck level in ~ 1week Pharmacy will sign off.  Talbert Cage, PharmD Clinical Pharmacist [phone] 07/26/2013,8:55 AM

## 2013-07-26 NOTE — Evaluation (Signed)
Occupational Therapy Evaluation Patient Details Name: Michelle [name] MRN: [id number] DOB: 1961/05/22 Today's Date: 07/26/2013 Time: 1534-1600 OT Time Calculation (min): 26 min  OT Assessment / Plan / Recommendation History of present illness This is a 53 year old woman with PMH of CHF (EF normal, with Grade-2 diastolic dysfunction), HIV, seizure disorder, hx of stroke, HTN, T2DM, CKD, gout, who was admitted with sepsis and seizure.  Also Hgb 6.5.     Clinical Impression   Pt demos decline in function with ADLs and ADL mobility safety and would benefit from acute OT services to address impairments tio increase level of function and safety    OT Assessment  Patient needs continued OT Services    Follow Up Recommendations  SNF;Supervision/Assistance - 24 hour;Home health OT (HH/24 hr sup if refusing SNF)   Barriers to Discharge Decreased caregiver support uncertain if family can provide current level of care or that [facility] wil be adequate for rehab needs at this time  Equipment Recommendations  Other (comment) Lexicographer)    Recommendations for Other Services    Frequency  Min 2X/week    Precautions / Restrictions Precautions Precautions: Fall Precaution Comments: reports last fall months ago Restrictions Weight Bearing Restrictions: No   Pertinent Vitals/Pain No c/o pain    ADL  Grooming: Performed;Wash/dry hands;Wash/dry face;Min guard Where Assessed - Grooming: Supported standing Upper Body Bathing: Simulated;Set up;Supervision/safety Where Assessed - Upper Body Bathing: Unsupported sitting Lower Body Bathing: Simulated;Moderate assistance Upper Body Dressing: Performed;Supervision/safety;Set up Where Assessed - Upper Body Dressing: Unsupported sitting Lower Body Dressing: Performed;Maximal assistance Toilet Transfer: Performed;Min guard Toilet Transfer Method: Sit to Barista: Bedside commode Toileting - Clothing Manipulation and Hygiene: Minimal  assistance Where Assessed - Engineer, mining and Hygiene: Standing Tub/Shower Transfer Method: Not assessed Transfers/Ambulation Related to ADLs: requires increased time, fatigues easily ADL Comments: increased time to complete ADLs, fatigues easily    OT Diagnosis: Generalized weakness  OT Problem List: Decreased strength;Decreased activity tolerance;Impaired balance (sitting and/or standing) OT Treatment Interventions: Self-care/ADL training;Therapeutic activities;Therapeutic exercise;Neuromuscular education;Patient/family education;DME and/or AE instruction;Balance training   OT Goals(Current goals can be found in the care plan section) Acute Rehab OT Goals Patient Stated Goal: to get rehab and go home OT Goal Formulation: With patient Time For Goal Achievement: 07/26/13 Potential to Achieve Goals: Good ADL Goals Pt Will Perform Grooming: with supervision;with set-up;standing Pt Will Perform Lower Body Bathing: with min assist;sitting/lateral leans;sit to/from stand Pt Will Perform Lower Body Dressing: with mod assist;with min assist;sitting/lateral leans;sit to/from stand Pt Will Transfer to Toilet: with supervision;with modified independence;bedside commode;grab bars;ambulating;regular height toilet Pt Will Perform Toileting - Clothing Manipulation and hygiene: with min guard assist;with supervision;sit to/from stand;sitting/lateral leans Pt Will Perform Tub/Shower Transfer: with min guard assist;with supervision  Visit Information  Last OT Received On: 07/26/13 Assistance Needed: +1 History of Present Illness: This is a 53 year old woman with PMH of CHF (EF normal, with Grade-2 diastolic dysfunction), HIV, seizure disorder, hx of stroke, HTN, T2DM, CKD, gout, who was admitted with sepsis and seizure.  Also Hgb 6.5.         Prior Functioning     Home Living Family/patient expects to be discharged to:: Private residence Living Arrangements: Alone Available Help  at Discharge: Family;Available PRN/intermittently Type of Home: Apartment Home Access: Level entry Home Layout: One level Home Equipment: Cane - single point;Grab bars - tub/shower;Grab bars - toilet;Walker - standard;Walker - 2 wheels;Toilet riser Additional Comments: cousin comes in every day about 4  hours to assist and hopes to get paid as PCA  Lives With: Alone Prior Function Level of Independence: Needs assistance Gait / Transfers Assistance Needed: uses device at times (especially when gout acting up) uses motorized scooters at store (when available) ADL's / Homemaking Assistance Needed: cousin assists with bath and meals and cleaning Comments: pt has family member come in every other day to help pt bath, clean house and cook but pt feels like this is not enough any longer Communication Communication: No difficulties Dominant Hand: Right         Vision/Perception Vision - History Baseline Vision: Wears glasses all the time Patient Visual Report: No change from baseline Perception Perception: Within Functional Limits   Cognition  Cognition Arousal/Alertness: Awake/alert Behavior During Therapy: WFL for tasks assessed/performed Overall Cognitive Status: Within Functional Limits for tasks assessed    Extremity/Trunk Assessment Upper Extremity Assessment Upper Extremity Assessment: Overall WFL for tasks assessed;Generalized weakness Lower Extremity Assessment Lower Extremity Assessment: Defer to PT evaluation RLE Deficits / Details: edema and numbness in feet; strength hip flexion 3-/5, knee extension 4/5, ankle DF 4+/5 LLE Deficits / Details: edema and numbness in feet; strength hip flexion 3-/5, knee extension 4/5, ankle DF 4+/5 Cervical / Trunk Assessment Cervical / Trunk Assessment: Kyphotic     Mobility Bed Mobility Overal bed mobility: Modified Independent Transfers Overall transfer level: Needs assistance Equipment used: None Transfers: Sit to/from Stand Sit  to Stand: Min guard General transfer comment: required increased time, fatigues easily        Balance Balance Overall balance assessment: Needs assistance Sitting-balance support: No upper extremity supported;Feet supported Sitting balance-Leahy Scale: Good Postural control: Posterior lean Standing balance support: Bilateral upper extremity supported;Single extremity supported;During functional activity Standing balance-Leahy Scale: Poor Standing balance comment: Pt unsteady statically and dynamically needing HHA for support   End of Session OT - End of Session Equipment Utilized During Treatment: Gait belt;Other (comment) (BSC) Activity Tolerance: Patient limited by fatigue;Patient tolerated treatment well Patient left: in bed;with call bell/phone within reach;with family/visitor present  GO     Galen ManilaSpencer, Dannie Hattabaugh Jeanette 07/26/2013, 4:12 PM

## 2013-07-27 DIAGNOSIS — A419 Sepsis, unspecified organism: Secondary | ICD-10-CM

## 2013-07-27 LAB — BASIC METABOLIC PANEL
BUN: 41 mg/dL — ABNORMAL HIGH (ref 6–23)
CALCIUM: 7.6 mg/dL — AB (ref 8.4–10.5)
CHLORIDE: 97 meq/L (ref 96–112)
CO2: 24 mEq/L (ref 19–32)
Creatinine, Ser: 2.41 mg/dL — ABNORMAL HIGH (ref 0.50–1.10)
GFR calc non Af Amer: 22 mL/min — ABNORMAL LOW (ref >90)
GFR, EST AFRICAN AMERICAN: 25 mL/min — AB (ref >90)
Glucose, Bld: 142 mg/dL — ABNORMAL HIGH (ref 70–99)
Potassium: 3.1 mEq/L — ABNORMAL LOW (ref 3.7–5.3)
Sodium: 135 mEq/L — ABNORMAL LOW (ref 137–147)

## 2013-07-27 LAB — CBC
HCT: 22.5 % — ABNORMAL LOW (ref 36.0–46.0)
Hemoglobin: 7.5 g/dL — ABNORMAL LOW (ref 12.0–15.0)
MCH: 31.8 pg (ref 26.0–34.0)
MCHC: 33.3 g/dL (ref 30.0–36.0)
MCV: 95.3 fL (ref 78.0–100.0)
PLATELETS: 247 10*3/uL (ref 150–400)
RBC: 2.36 MIL/uL — ABNORMAL LOW (ref 3.87–5.11)
RDW: 22.5 % — ABNORMAL HIGH (ref 11.5–15.5)
WBC: 5.6 10*3/uL (ref 4.0–10.5)

## 2013-07-27 LAB — GLUCOSE, CAPILLARY
GLUCOSE-CAPILLARY: 86 mg/dL (ref 70–99)
GLUCOSE-CAPILLARY: 183 mg/dL — AB (ref 70–99)

## 2013-07-27 MED ORDER — CEPHALEXIN 500 MG PO CAPS: 500.0000 mg | ORAL_CAPSULE | Freq: Two times a day (BID) | ORAL | Status: DC

## 2013-07-27 MED ORDER — PHENYTOIN SODIUM EXTENDED 30 MG PO CAPS: 30.0000 mg | ORAL_CAPSULE | Freq: Two times a day (BID) | ORAL | Status: DC

## 2013-07-27 MED ORDER — LEVETIRACETAM 500 MG PO TABS: 500.0000 mg | ORAL_TABLET | Freq: Two times a day (BID) | ORAL | Status: DC

## 2013-07-27 MED ORDER — PHENYTOIN SODIUM EXTENDED 200 MG PO CAPS: 200.0000 mg | ORAL_CAPSULE | Freq: Two times a day (BID) | ORAL | Status: DC

## 2013-07-27 MED ORDER — INSULIN GLARGINE 100 UNIT/ML ~~LOC~~ SOLN: 50.0000 [IU] | Freq: Every day | SUBCUTANEOUS | Status: DC

## 2013-07-27 MED ORDER — LABETALOL HCL 200 MG PO TABS: 200.0000 mg | ORAL_TABLET | Freq: Two times a day (BID) | ORAL | Status: DC

## 2013-07-27 NOTE — Progress Notes (Signed)
   CARE MANAGEMENT NOTE 07/27/2013  Patient:  Michelle [name], Michelle [name]   Account Number:  [id number]  Date Initiated:  07/25/2013  Documentation initiated by:  HUTCHINSON,CRYSTAL  Subjective/Objective Assessment:   TF from 2C  Admitted with hyperglycemia, jaundice, ESRD     Action/Plan:   evaluation of her AV fistula when stable  Vein mapping pending//resume HH services   Anticipated DC Date:  07/27/2013   Anticipated DC Plan:  HOME W HOME HEALTH SERVICES      DC Planning Services  CM consult      [facility] Choice  HOME HEALTH  Resumption Of Svcs/PTA Provider   Choice offered to / List presented to:  C-1 Patient        HH arranged  HH-1 RN  HH-10 DISEASE MANAGEMENT  HH-2 PT      HH agency  [facility]   Status of service:  Completed, signed off Medicare Important Message given?   (If response is "NO", the following Medicare IM given date fields will be blank) Date Medicare IM given:   Date Additional Medicare IM given:    Discharge Disposition:  HOME W HOME HEALTH SERVICES  Per UR Regulation:  Reviewed for med. necessity/level of care/duration of stay  If discussed at Long Length of Stay Meetings, dates discussed:   07/25/2013    Comments:  07/27/13 15:36 CM called to pt's room to discuss HH.  Mother of pt (biological Aunt but raised pt) was in room and explained the person who pt states will be there 24 hours/day actually has a toddler and works two jobs so the best she can do is check in on pt.  Both pt and Mother agree it is unsafe to go home and are ammenable to SNF.  MD called and agreed on SNF placement.  CSW called for SNF placement.  Received callback from CSW as pt has more family members in the room and wishes to discuss disposition.  CM spokw with Jerl Mina Misty Stanley) [phone], sister, Perley Jain (Ko-KO) [phone] and they, along with pt state "No family member will go to a skilled facility." They state they will figure out supervision and go to the pt's housing to  get a bigger place to house whoever will be taking care of pt.  They request HH orders to be reinstated.  CM called MD to relay aforementioned and MD will discharge pt.  No DME required as pt has 3n1, RW, canes, and other equipment.  Referral faxed to [facility].  No other CM needs were commnicated.   Freddy Jaksch, BSN, [place] [phone].  07/27/13 10:49 CM notified [facility] of pt discharge. [facility] to render HHPT/OT/RN/SW/aide.  No other CM needs were communicated.  Freddy Jaksch, BSN, [place] [phone].  07/26/13 1400 Oletta Cohn, RN, BSN, [place] [phone] Pt currently active with [facility] for RN/PT services.  Resumption of care requested.  Kizzie Furnish, RN of [facility] notified.  No DME needs identified at this time.

## 2013-07-27 NOTE — Progress Notes (Signed)
INTERNAL MEDICINE TEACHING SERVICE DAILY PROGRESS NOTE Subjective: Pt is doing well this AM. No visible signs of bleeding. Pt anxious to go home. Tolerated regular diet and ambulation to chair. VSS.   Objective: Vital signs in last 24 hours: Filed Vitals:   07/26/13 1459 07/26/13 1822 07/26/13 2038 07/27/13 0423  BP: 123/66 138/71 135/68 143/77  Pulse: 86  92 93  Temp: 97.8 F (36.6 C)  99.2 F (37.3 C) 97.9 F (36.6 C)  TempSrc: Oral  Oral Oral  Resp: 20  18 18   Height:      Weight:    182 lb 4.8 oz (82.691 kg)  SpO2: 100%  99% 100%   Weight change: -1 lb 2 oz (-0.509 kg)  Intake/Output Summary (Last 24 hours) at 07/27/13 0951 Last data filed at 07/27/13 0700  Gross per 24 hour  Intake    980 ml  Output   1775 ml  Net   -795 ml   General: Vital signs reviewed. Resting comfortably in bed, NAD Lungs: Clear to Auscultation Bilaterally Heart: RRR; no extra sounds or murmurs  Abdomen: Bowel sounds present, soft, nontender; no hepatosplenomegaly  Extremities: no lower extremity edema, healed lower extremity wounds in bandages no active drainage Neurologic: AAOx3  Lab Results: Basic Metabolic Panel:  Recent Labs Lab 07/22/13 0335  07/24/13 0126 07/25/13 0450  NA 140  < > 138 137  K 3.4*  < > 3.3* 3.6*  CL 97  < > 96 98  CO2 32  < > 27 25  GLUCOSE 43*  < > 105* 122*  BUN 66*  < > 47* 48*  CREATININE 2.59*  < > 2.40* 2.60*  CALCIUM 7.8*  < > 7.9* 7.2*  MG 1.8  --   --   --   < > = values in this interval not displayed. Liver Function Tests:  Recent Labs Lab 07/22/13 0335 07/23/13 1036  BILITOT 7.0*  --   ALBUMIN  --  1.3*   CBC:  Recent Labs Lab 07/25/13 0450 07/25/13 1555 07/26/13 0308  WBC 6.5  --  7.3  HGB 6.5* 8.1* 7.8*  HCT 19.4* 23.7* 23.0*  MCV 98.5  --  94.7  PLT 175  --  210   CBG:  Recent Labs Lab 07/25/13 2116 07/26/13 0553 07/26/13 1108 07/26/13 1602 07/26/13 2104 07/27/13 0609  GLUCAP 133* 80 138* 183* 118* 86   Anemia  Panel:  Recent Labs Lab 07/24/13 0126  RETICCTPCT 1.7   Medications: I have reviewed the patient's current medications. Scheduled Meds: . abacavir  600 mg Oral Daily  . acetaminophen  650 mg Oral Once  . antiseptic oral rinse  15 mL Mouth Rinse BID  . aspirin EC  81 mg Oral Daily  . cephALEXin  500 mg Oral Q12H  . Darunavir Ethanolate  800 mg Oral Q breakfast  . furosemide  160 mg Oral BID  . heparin  5,000 Units Subcutaneous 3 times per day  . hydrALAZINE  50 mg Oral TID  . insulin aspart  0-20 Units Subcutaneous TID WC  . insulin glargine  50 Units Subcutaneous Daily  . labetalol  200 mg Oral BID  . lamiVUDine  100 mg Oral Daily  . levETIRAcetam  500 mg Oral Q12H  . LORazepam  1 mg Intravenous QHS  . pantoprazole  40 mg Oral Daily  . phenytoin  200 mg Oral BID   And  . phenytoin  30 mg Oral BID  . ritonavir  100 mg  Oral Q breakfast  . sodium chloride  3 mL Intravenous Q12H   Continuous Infusions:   PRN Meds:.acetaminophen, diphenhydrAMINE, LORazepam, ondansetron (ZOFRAN) IV, ondansetron, oxyCODONE Assessment/Plan: # Sepsis E coli Bacteremia mostly likely secondary Pylenephritis: Blood culture 1/2 on 07/19/2013 revealed E coli. resistant to Ampicillin, Bactrim. Patient with +. Repeat Blood Cultures negative  Patient continues to have some intermittent mild fevers. Plan  - Will continue Keflex on discharge will treat for additional 6 days.  # Seizure disorder -  -Neurology signed off - Avoid fluroquinolones - New Keppra dose 500mg  BID due to renal insufficieny will need to increase if GFR >30 - New dilantin dose 230 BID, will need level repeated at hospital followup  # Acute Kidney Injury on CKD - Improving. Biopsy showed collapsing FSGS. Patient Creatinine almost at baseline. - Will need outpatient follow up scheduled outpatient graft placement in a few weeks.   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 Mild drop yesterday repeat studies reassuring  that this is not due to AIHA, And Hgb this AM down to 6.5 patient did undergo right antecubital exploration yesterday. - Monitored Hgb overnight, stable.  # Diabetes: well controlled - Continue Lantus to 50 units  -SSI-R - Carb mod diet  # Hypertension : BP better controlled - Labetalol 200mg  BID - hydralazine 50mg  TID - Lasix 160 BID -Will restart lisinopril on discharge  # HIV - last CD4 = 490 (05/07/13)  -continue abacavir, prezista, lamivudine (renal adjust to 100mg ), norvir  Diet: Carb mod Dispo: Discharge home today  The patient does have a current PCP Burtis Junes, Arna Medici, MD), therefore will be requiring OPC follow-up after discharge.   The patient does not have transportation limitations that hinder transportation to clinic appointments.  .Services Needed at time of discharge: Y = Yes, Blank = No PT: y (resume HH)  OT:   RN:   Equipment:   Other:     LOS: 9 days   Christen Bame PGY 2 - Internal Medicine Teaching Service Pager: [phone]  07/27/2013, 9:51 AM

## 2013-07-27 NOTE — Progress Notes (Signed)
   CARE MANAGEMENT NOTE 07/27/2013  Patient:  Michelle [name], Michelle [name]   Account Number:  [id number]  Date Initiated:  07/25/2013  Documentation initiated by:  HUTCHINSON,CRYSTAL  Subjective/Objective Assessment:   TF from 2C  Admitted with hyperglycemia, jaundice, ESRD     Action/Plan:   evaluation of her AV fistula when stable  Vein mapping pending//resume HH services   Anticipated DC Date:  07/27/2013   Anticipated DC Plan:  HOME W HOME HEALTH SERVICES      DC Planning Services  CM consult      [facility] Choice  HOME HEALTH  Resumption Of Svcs/PTA Provider   Choice offered to / List presented to:  C-1 Patient        HH arranged  HH-1 RN  HH-10 DISEASE MANAGEMENT  HH-2 PT      HH agency  [facility]   Status of service:  Completed, signed off Medicare Important Message given?   (If response is "NO", the following Medicare IM given date fields will be blank) Date Medicare IM given:   Date Additional Medicare IM given:    Discharge Disposition:  HOME W HOME HEALTH SERVICES  Per UR Regulation:  Reviewed for med. necessity/level of care/duration of stay  If discussed at Long Length of Stay Meetings, dates discussed:   07/25/2013    Comments:  07/27/13 10:49 CM notified [facility] of pt discharge. [facility] to render HHPT/OT/RN/SW/aide.  No other CM needs were communicated.  Freddy Jaksch, BSN, [place] [phone].  07/26/13 1400 Oletta Cohn, RN, BSN, [place] [phone] Pt currently active with [facility] for RN/PT services.  Resumption of care requested.  Kizzie Furnish, RN of [facility] notified.  No DME needs identified at this time.

## 2013-07-27 NOTE — Progress Notes (Signed)
Clinical Child psychotherapist (CSW) spoke with patient via telephone about D/C plan. Patient reported she has been admitted for 8 days at [facility] and was diagnosed during this admission with chronic issues. Patient stated she was told about going to a SNF versus home health services. Patient stated she desires to have home health care. Patient reported her niece would be able to provide 24 hour assistance at the home. Per case manager, patient will be going home with home health services. CSW signing off.

## 2013-07-27 NOTE — Progress Notes (Signed)
The patient did not have any acute changes overnight.  She did complain of a headache and itching this morning, and was treated with Benadryl and Tylenol.

## 2013-07-27 NOTE — Discharge Instructions (Signed)
Abscess An abscess is an infected area that contains a collection of pus and debris.It can occur in almost any part of the body. An abscess is also known as a furuncle or boil. CAUSES  An abscess occurs when tissue gets infected. This can occur from blockage of oil or sweat glands, infection of hair follicles, or a minor injury to the skin. As the body tries to fight the infection, pus collects in the area and creates pressure under the skin. This pressure causes pain. People with weakened immune systems have difficulty fighting infections and get certain abscesses more often.  SYMPTOMS Usually an abscess develops on the skin and becomes a painful mass that is red, warm, and tender. If the abscess forms under the skin, you may feel a moveable soft area under the skin. Some abscesses break open (rupture) on their own, but most will continue to get worse without care. The infection can spread deeper into the body and eventually into the bloodstream, causing you to feel ill.  DIAGNOSIS  Your caregiver will take your medical history and perform a physical exam. A sample of fluid may also be taken from the abscess to determine what is causing your infection. TREATMENT  Your caregiver may prescribe antibiotic medicines to fight the infection. However, taking antibiotics alone usually does not cure an abscess. Your caregiver may need to make a small cut (incision) in the abscess to drain the pus. In some cases, gauze is packed into the abscess to reduce pain and to continue draining the area. HOME CARE INSTRUCTIONS   Only take over-the-counter or prescription medicines for pain, discomfort, or fever as directed by your caregiver.  If you were prescribed antibiotics, take them as directed. Finish them even if you start to feel better.  If gauze is used, follow your caregiver's directions for changing the gauze.  To avoid spreading the infection:  Keep your draining abscess covered with a  bandage.  Wash your hands well.  Do not share personal care items, towels, or whirlpools with others.  Avoid skin contact with others.  Keep your skin and clothes clean around the abscess.  Keep all follow-up appointments as directed by your caregiver. SEEK MEDICAL CARE IF:   You have increased pain, swelling, redness, fluid drainage, or bleeding.  You have muscle aches, chills, or a general ill feeling.  You have a fever. MAKE SURE YOU:   Understand these instructions.  Will watch your condition.  Will get help right away if you are not doing well or get worse. Document Released: 02/23/2005 Document Revised: 11/15/2011 Document Reviewed: 07/29/2011 Exit[employer] Patient Information 2014 [employer].  

## 2013-07-29 LAB — GLUCOSE, CAPILLARY: Glucose-Capillary: 238 mg/dL — ABNORMAL HIGH (ref 70–99)

## 2013-07-29 NOTE — Discharge Summary (Signed)
I agree with summary as outlined.  She will follow up with HIV clinic, primary and nephrology.

## 2013-07-29 NOTE — Progress Notes (Signed)
Reviewed.  Tierria Watson, MD Pulmonary and Critical Care Medicine [facility] Pager: [phone]  

## 2013-08-02 ENCOUNTER — Ambulatory Visit (INDEPENDENT_AMBULATORY_CARE_PROVIDER_SITE_OTHER): Payer: Medicaid Other | Admitting: Internal Medicine

## 2013-08-02 ENCOUNTER — Encounter (HOSPITAL_COMMUNITY): Payer: Self-pay | Admitting: Emergency Medicine

## 2013-08-02 ENCOUNTER — Other Ambulatory Visit: Payer: Self-pay | Admitting: *Deleted

## 2013-08-02 ENCOUNTER — Emergency Department (HOSPITAL_COMMUNITY)
Admission: EM | Admit: 2013-08-02 | Discharge: 2013-08-02 | Disposition: A | Discharge status: Home / Self Care | Payer: Medicaid Other | Attending: Emergency Medicine | Admitting: Emergency Medicine

## 2013-08-02 VITALS — BP 116/69 | HR 77 | Temp 97.6°F | Ht 65.5 in | Wt 189.7 lb

## 2013-08-02 DIAGNOSIS — N049 Nephrotic syndrome with unspecified morphologic changes: Secondary | ICD-10-CM | POA: Insufficient documentation

## 2013-08-02 DIAGNOSIS — E119 Type 2 diabetes mellitus without complications: Secondary | ICD-10-CM

## 2013-08-02 DIAGNOSIS — R45851 Suicidal ideations: Secondary | ICD-10-CM

## 2013-08-02 DIAGNOSIS — Z862 Personal history of diseases of the blood and blood-forming organs and certain disorders involving the immune mechanism: Secondary | ICD-10-CM | POA: Insufficient documentation

## 2013-08-02 DIAGNOSIS — L299 Pruritus, unspecified: Secondary | ICD-10-CM | POA: Insufficient documentation

## 2013-08-02 DIAGNOSIS — Z8661 Personal history of infections of the central nervous system: Secondary | ICD-10-CM | POA: Insufficient documentation

## 2013-08-02 DIAGNOSIS — Z87891 Personal history of nicotine dependence: Secondary | ICD-10-CM | POA: Insufficient documentation

## 2013-08-02 DIAGNOSIS — N189 Chronic kidney disease, unspecified: Secondary | ICD-10-CM | POA: Insufficient documentation

## 2013-08-02 DIAGNOSIS — Z8619 Personal history of other infectious and parasitic diseases: Secondary | ICD-10-CM | POA: Insufficient documentation

## 2013-08-02 DIAGNOSIS — Z7982 Long term (current) use of aspirin: Secondary | ICD-10-CM | POA: Insufficient documentation

## 2013-08-02 DIAGNOSIS — Z79899 Other long term (current) drug therapy: Secondary | ICD-10-CM | POA: Insufficient documentation

## 2013-08-02 DIAGNOSIS — I509 Heart failure, unspecified: Secondary | ICD-10-CM | POA: Insufficient documentation

## 2013-08-02 DIAGNOSIS — F411 Generalized anxiety disorder: Secondary | ICD-10-CM | POA: Insufficient documentation

## 2013-08-02 DIAGNOSIS — Z794 Long term (current) use of insulin: Secondary | ICD-10-CM | POA: Insufficient documentation

## 2013-08-02 DIAGNOSIS — G40909 Epilepsy, unspecified, not intractable, without status epilepticus: Secondary | ICD-10-CM | POA: Insufficient documentation

## 2013-08-02 DIAGNOSIS — M109 Gout, unspecified: Secondary | ICD-10-CM | POA: Insufficient documentation

## 2013-08-02 DIAGNOSIS — Z21 Asymptomatic human immunodeficiency virus [HIV] infection status: Secondary | ICD-10-CM | POA: Insufficient documentation

## 2013-08-02 DIAGNOSIS — I129 Hypertensive chronic kidney disease with stage 1 through stage 4 chronic kidney disease, or unspecified chronic kidney disease: Secondary | ICD-10-CM | POA: Insufficient documentation

## 2013-08-02 DIAGNOSIS — Z8673 Personal history of transient ischemic attack (TIA), and cerebral infarction without residual deficits: Secondary | ICD-10-CM | POA: Insufficient documentation

## 2013-08-02 LAB — COMPREHENSIVE METABOLIC PANEL
ALK PHOS: 583 U/L — AB (ref 39–117)
ALT: 30 U/L (ref 0–35)
AST: 47 U/L — ABNORMAL HIGH (ref 0–37)
Albumin: 2 g/dL — ABNORMAL LOW (ref 3.5–5.2)
BILIRUBIN TOTAL: 4 mg/dL — AB (ref 0.3–1.2)
BUN: 35 mg/dL — ABNORMAL HIGH (ref 6–23)
CHLORIDE: 106 meq/L (ref 96–112)
CO2: 17 mEq/L — ABNORMAL LOW (ref 19–32)
Calcium: 7.9 mg/dL — ABNORMAL LOW (ref 8.4–10.5)
Creatinine, Ser: 2 mg/dL — ABNORMAL HIGH (ref 0.50–1.10)
GFR calc non Af Amer: 27 mL/min — ABNORMAL LOW (ref >90)
GFR, EST AFRICAN AMERICAN: 31 mL/min — AB (ref >90)
GLUCOSE: 284 mg/dL — AB (ref 70–99)
POTASSIUM: 3.5 meq/L — AB (ref 3.7–5.3)
Sodium: 140 mEq/L (ref 137–147)
Total Protein: 7.3 g/dL (ref 6.0–8.3)

## 2013-08-02 LAB — RAPID URINE DRUG SCREEN, HOSP PERFORMED
Amphetamines: NOT DETECTED
BARBITURATES: NOT DETECTED
BENZODIAZEPINES: NOT DETECTED
Cocaine: NOT DETECTED
Opiates: NOT DETECTED
TETRAHYDROCANNABINOL: NOT DETECTED

## 2013-08-02 LAB — SALICYLATE LEVEL: Salicylate Lvl: <2 mg/dL — ABNORMAL LOW (ref 2.8–20.0)

## 2013-08-02 LAB — CBC
HCT: 25.2 % — ABNORMAL LOW (ref 36.0–46.0)
HEMOGLOBIN: 8.3 g/dL — AB (ref 12.0–15.0)
MCH: 32.4 pg (ref 26.0–34.0)
MCHC: 32.9 g/dL (ref 30.0–36.0)
MCV: 98.4 fL (ref 78.0–100.0)
Platelets: 363 10*3/uL (ref 150–400)
RBC: 2.56 MIL/uL — ABNORMAL LOW (ref 3.87–5.11)
RDW: 21.5 % — ABNORMAL HIGH (ref 11.5–15.5)
WBC: 4.6 10*3/uL (ref 4.0–10.5)

## 2013-08-02 LAB — ACETAMINOPHEN LEVEL

## 2013-08-02 LAB — ETHANOL: Alcohol, Ethyl (B): <11 mg/dL (ref 0–11)

## 2013-08-02 LAB — POCT GLYCOSYLATED HEMOGLOBIN (HGB A1C): Hemoglobin A1C: 6.3

## 2013-08-02 LAB — GLUCOSE, CAPILLARY: GLUCOSE-CAPILLARY: 138 mg/dL — AB (ref 70–99)

## 2013-08-02 MED ORDER — DIPHENHYDRAMINE HCL 25 MG PO CAPS
25.0000 mg | ORAL_CAPSULE | Freq: Once | ORAL | Status: AC
  Administered 2013-08-02: 25 mg via ORAL
  Filled 2013-08-02: qty 1

## 2013-08-02 MED ORDER — HYDROCODONE-ACETAMINOPHEN 5-325 MG PO TABS
1.0000 | ORAL_TABLET | Freq: Once | ORAL | Status: AC
  Administered 2013-08-02: 1 via ORAL
  Filled 2013-08-02: qty 1

## 2013-08-02 NOTE — ED Provider Notes (Signed)
CSN: [id number]     Arrival date & time 08/02/13  1432 History   First MD Initiated Contact with Patient 08/02/13 1844     Chief Complaint  Patient presents with  . Behavior Problem     HPI  53 yo F in NAD AFVSS non toxic appearing who was sent here from internal medicine clinic for concern of self injury threats. The patient herself denies these allegations she states that she did not really mean them. Patient stated " I want to cut my legs off" . She states the reason she said it was because her legs were pruritic but that she would not actually go through with this. She denies any hallucinations, SI, or HI.  She states she has family including grand kids she wants to live for. Her sister diane is in the room with her and is vouching for the patient. The patient is jovial in the room and joking around.  Patient repeatedly denied SI.   Past Medical History  Diagnosis Date  . Seizures   . Stroke   . Meningitis   . HIV (human immunodeficiency virus infection)   . Hypertension   . Gout   . Muscle spasms of head and/or neck   . CKD (chronic kidney disease)   . CHF (congestive heart failure)     [name] 06/18/2013  . HCV (hepatitis C virus)     chronic/notes 06/18/2013  . Type II diabetes mellitus     [name] 06/18/2013  . AIHA (autoimmune hemolytic anemia)     [name] 06/18/2013  . Hypertensive encephalopathy ~ 05/2013    hospitalaized/notes 06/18/2013  . Daily headache     "for the last 6 years/notes 06/18/2013  . Exertional shortness of breath     [name] 06/18/2013  . Anxiety     [name] 06/18/2013  . Nephrotic syndrome   . History of syphilis   . High cholesterol    Past Surgical History  Procedure Laterality Date  . Hip pinning Right   . Av fistula placement Right 07/24/2013    Procedure: RIGHT arm exploration of antecubital space;  Surgeon: Elam Dutch, MD;  Location: [facility] OR;  Service: Vascular;  Laterality: Right;   Family History  Problem Relation Age of Onset  . Cancer -  Colon Mother   . Cancer Father   . Diabetes    . Hypertension Father    History  Substance Use Topics  . Smoking status: Former Smoker    Types: Cigarettes    Quit date: 06/19/2010  . Smokeless tobacco: Never Used  . Alcohol Use: No   OB History   Grav Para Term Preterm Abortions TAB SAB Ect Mult Living                 Review of Systems  Constitutional: Negative for fever, chills, activity change and appetite change.  HENT: Negative for congestion, ear pain and rhinorrhea.   Eyes: Negative for pain.  Respiratory: Negative for cough and shortness of breath.   Cardiovascular: Negative for chest pain and palpitations.  Gastrointestinal: Negative for nausea, vomiting and abdominal pain.  Genitourinary: Negative for dysuria, difficulty urinating and pelvic pain.  Musculoskeletal: Negative for back pain and neck pain.  Skin: Negative for rash and wound.  Neurological: Negative for weakness and headaches.  Psychiatric/Behavioral: Negative for behavioral problems, confusion and agitation.      Allergies  Ceftriaxone; Norvasc; and Morphine and related  Home Medications   Current Outpatient Rx  Name  Route  Sig  Dispense  Refill  . abacavir (ZIAGEN) 300 MG tablet   Oral   Take 2 tablets (600 mg total) by mouth daily.   60 tablet   6   . abacavir-lamiVUDine (EPZICOM) 600-300 MG per tablet   Oral   Take 1 tablet by mouth daily.         [name] aspirin EC 81 MG tablet   Oral   Take 1 tablet (81 mg total) by mouth daily.   30 tablet   11   . cephALEXin (KEFLEX) 500 MG capsule   Oral   Take 1 capsule (500 mg total) by mouth every 12 (twelve) hours.   12 capsule   0   . cholecalciferol (VITAMIN D) 1000 UNITS tablet   Oral   Take 1,000 Units by mouth daily.         . Darunavir Ethanolate (PREZISTA) 800 MG tablet   Oral   Take 1 tablet (800 mg total) by mouth daily with breakfast.   30 tablet   6   . diphenhydrAMINE (BENADRYL) 25 mg capsule   Oral   Take 1  capsule (25 mg total) by mouth every 6 (six) hours as needed for itching.   60 capsule   0   . furosemide (LASIX) 40 MG tablet   Oral   Take 40 mg by mouth daily.         [name] gentamicin (GARAMYCIN) 0.3 % ophthalmic ointment   Both Eyes   Place 1 application into both eyes 4 (four) times daily.         [name] glucose blood (ACCU-CHEK AVIVA) test strip      Use as instructed   100 each   12   . insulin aspart (NOVOLOG) 100 UNIT/ML injection   Subcutaneous   Inject into the skin 3 (three) times daily before meals. Home Sliding scale         . insulin glargine (LANTUS) 100 UNIT/ML injection   Subcutaneous   Inject 0.5 mLs (50 Units total) into the skin at bedtime.   10 mL   11   . lamiVUDine (EPIVIR) 150 MG tablet   Oral   Take 1 tablet (150 mg total) by mouth daily.   30 tablet   6   . levETIRAcetam (KEPPRA) 500 MG tablet   Oral   Take 1,500 mg by mouth 2 (two) times daily.         [name] lisinopril (PRINIVIL,ZESTRIL) 20 MG tablet   Oral   Take 40 mg by mouth daily.         [name] lopinavir-ritonavir (KALETRA) 200-50 MG per tablet   Oral   Take 2 tablets by mouth 2 (two) times daily.         [name] LORazepam (ATIVAN) 1 MG tablet   Oral   Take 1 mg by mouth daily. Take one tablet for seizure prevention.         . methocarbamol (ROBAXIN) 500 MG tablet   Oral   Take 500 mg by mouth every 6 (six) hours as needed for muscle spasms.         . Oxycodone HCl 10 MG TABS   Oral   Take 1 tablet (10 mg total) by mouth every 6 (six) hours as needed (severe pain).   12 tablet   0   . pantoprazole (PROTONIX) 40 MG tablet   Oral   Take 1 tablet (40 mg total) by mouth daily.   30 tablet   11   .  phenytoin (DILANTIN INFATABS) 50 MG tablet   Oral   Chew 200 mg by mouth 2 (two) times daily.         . ritonavir (NORVIR) 100 MG TABS tablet   Oral   Take 100 mg by mouth daily with breakfast.          BP 151/83  Pulse 78  Temp(Src) 98.2 F (36.8 C) (Oral)  Resp 14   SpO2 98% Physical Exam  Constitutional: She is oriented to person, place, and time. She appears well-developed and well-nourished. No distress.  HENT:  Head: Normocephalic and atraumatic.  Nose: Nose normal.  Mouth/Throat: Oropharynx is clear and moist.  Eyes: EOM are normal. Pupils are equal, round, and reactive to light.  Neck: Normal range of motion. Neck supple. No tracheal deviation present.  Cardiovascular: Normal rate, regular rhythm, normal heart sounds and intact distal pulses.   Pulmonary/Chest: Effort normal and breath sounds normal. She has no rales.  Abdominal: Soft. Bowel sounds are normal. She exhibits no distension. There is no tenderness. There is no rebound and no guarding.  Musculoskeletal: Normal range of motion. She exhibits no tenderness.  Neurological: She is alert and oriented to person, place, and time.  Skin: Skin is warm and dry. No rash noted.  Psychiatric: She has a normal mood and affect. Her behavior is normal.    ED Course  Procedures (including critical care time) Labs Review Labs Reviewed  CBC - Abnormal; Notable for the following:    RBC 2.56 (*)    Hemoglobin 8.3 (*)    HCT 25.2 (*)    RDW 21.5 (*)    All other components within normal limits  COMPREHENSIVE METABOLIC PANEL - Abnormal; Notable for the following:    Potassium 3.5 (*)    CO2 17 (*)    Glucose, Bld 284 (*)    BUN 35 (*)    Creatinine, Ser 2.00 (*)    Calcium 7.9 (*)    Albumin 2.0 (*)    AST 47 (*)    Alkaline Phosphatase 583 (*)    Total Bilirubin 4.0 (*)    GFR calc non Af Amer 27 (*)    GFR calc Af Amer 31 (*)    All other components within normal limits  SALICYLATE LEVEL - Abnormal; Notable for the following:    Salicylate Lvl <6.9 (*)    All other components within normal limits  ACETAMINOPHEN LEVEL  ETHANOL  URINE RAPID DRUG SCREEN (HOSP PERFORMED)    MDM   Final diagnoses:  Pruritus    53 yo F in NAD AFVSS non toxic appearing who was sent here from  internal medicine clinic for concern of self injury threats. The patient herself denies these allegations she states that she did not really mean them. Patient stated " I want to cut my legs off" . She states the reason she said it was because her legs were pruritic but that she would not actually go through with this. She denies any hallucinations, SI, or HI.  She states she has family including grand kids she wants to live for. Her sister diane is in the room with her and is vouching for the patient. The patient is jovial in the room and joking around. Forde Dandy is appropriate. Thorough discussion with patient on plan and findings. Strong return precautions given. Patient is to call 911 immediately if any feeling of self harm occur. Case co managed with my attending Dr. Winfred Leeds. Will follow up with PCP in  2-3 days.   Of note patient's alk phos is elevated but she has not abd ttp. Recommend repeating labs and follow up with PCP.     Ruthell Rummage, MD 08/02/13 2149

## 2013-08-02 NOTE — Telephone Encounter (Signed)
Pt called no answer message left to call the clinic.

## 2013-08-02 NOTE — ED Notes (Signed)
Pt arrives via wheelchair from outpt clinic appt. Sent over for psych eval because pt reportedly had an angry outburst at home related to her ongoing chronic health problems. Pt denies SI/HI at present. Pt awake, alert, cooperative, VSS.

## 2013-08-02 NOTE — ED Provider Notes (Signed)
Patient vehemently denies wanting to harm herself. She is pleasant jovial stating that she has lovely grandchildren at family she loves to be with. She states that she does not feel depressed. She describes a misunderstanding and that occurred during her clinic visit today when she stated "my life is so much I want to cut them off." Patient reports that she was only joking. Her sister vouches for her and knows her quite well. Her sister who is with her at this visit lives with her and states that she has not been depressed  Doug Sou, MD 08/02/13 2117

## 2013-08-02 NOTE — ED Notes (Signed)
Phlebotomy at the bedside  

## 2013-08-02 NOTE — Discharge Instructions (Signed)
Pruritus   Pruritis is an itch. There are many different problems that can cause an itch. Dry skin is one of the most common causes of itching. Most cases of itching do not require medical attention.   HOME CARE INSTRUCTIONS   Make sure your skin is moistened on a regular basis. A moisturizer that contains petroleum jelly is best for keeping moisture in your skin. If you develop a rash, you may try the following for relief:    Use corticosteroid cream.   Apply cool compresses to the affected areas.   Bathe with Epsom salts or baking soda in the bathwater.   Soak in colloidal oatmeal baths. These are available at your pharmacy.   Apply baking soda paste to the rash. Stir water into baking soda until it reaches a paste-like consistency.   Use an anti-itch lotion.   Take over-the-counter diphenhydramine medicine by mouth as the instructions direct.   Avoid scratching. Scratching may cause the rash to become infected. If itching is very bad, your caregiver may suggest prescription lotions or creams to lessen your symptoms.   Avoid hot showers, which can make itching worse. A cold shower may help with itching as long as you use a moisturizer after the shower.  SEEK MEDICAL CARE IF:  The itching does not go away after several days.  Document Released: 01/26/2011 Document Revised: 08/08/2011 Document Reviewed: 01/26/2011  [employer] Patient Information 2014 [employer].

## 2013-08-02 NOTE — Progress Notes (Signed)
Subjective:    Patient ID: Michelle [name], female    DOB: 04/25/1961, 53 y.o.   MRN: [id number]  HPI Michelle [name] is a 53 yo woman pmh as listed below presents for suicidal ideation.  [facility] received a phone call from Michelle [name]'s nephew stating that she had made clear threats of self injuring and self harming behavior including cutting her legs and wanting to end her life as a result of severe depression and frustration to her current living situation and medical conditions. The patient was accompanied by 2 sisters and the nephew all interview separately in regards to Ms. Job's statements, mood, and behavior.   The patient herself says that the feelings are "not as bad as usual." And that she would at no point in time wants to be home with any of her sisters. The patient was recently admitted to the hospital and discharged on 07/27/13 for a long and complicated course with pending discharge to a SNF. The patient was amenable until her family members came in and intervened refusing that she be "put in a home." Therefore the patient went home with home health services. The patient has expressed to this provider on previous visits that she has heard voices and had visual hallucinations including "Satan telling me to end my life, but that Jesus would protect her." the patient expresses that she does not feel safe at home with any of her family members and explicitly does not want any of them living with her and is in pain a lot but is not able to manage her medications. She does continually ask for pain medications throughout this interview.  Her sisters are both from out of town and have not been previously involved in her care as long as his provider has seen the patient. During an encounter with case managers during her recent hospitalization with a cousin named Rodney Boozeasha it appears that there are monetary gains with different family members that are compromising the care of this patient at home. Several times the  patient has gone home with full home health services and personal care services along with his cousin and has required admissions to the hospital do to medication noncompliance and escalating chronic medical issues. The sisters now believe that they would like forms filled out to the housing agency to allow for a 2 bedroom apartment so that one of his sisters can come and stay with the patient.  Michelle [name]'s nephew states that he works at a psychiatric ward and felt that the patient's threats were real. He has direct contact with the patient daily and helps organize her medications. He does feel that sometimes she also elicits auditory hallucinations or responses to internal stimuli but feels that "she was just having a bad breakdown."  Past Medical History  Diagnosis Date  . Seizures   . Stroke   . Meningitis   . HIV (human immunodeficiency virus infection)   . Hypertension   . Gout   . Muscle spasms of head and/or neck   . CKD (chronic kidney disease)   . CHF (congestive heart failure)     [name]/notes 06/18/2013  . HCV (hepatitis C virus)     chronic/notes 06/18/2013  . Type II diabetes mellitus     [name]/notes 06/18/2013  . AIHA (autoimmune hemolytic anemia)     [name]/notes 06/18/2013  . Hypertensive encephalopathy ~ 05/2013    hospitalaized/notes 06/18/2013  . Daily headache     "for the last 6 years/notes 06/18/2013  .  Exertional shortness of breath     [name] 06/18/2013  . Anxiety     [name] 06/18/2013  . Nephrotic syndrome   . History of syphilis   . High cholesterol     Review of Systems  Unable to perform ROS: Acuity of condition  Pt did describe having some pain in upper extremities where surgical sites from last admission.      Objective:   Physical Exam Filed Vitals:   08/02/13 1341  BP: 116/69  Pulse: 77  Temp: 97.6 F (36.4 C)   No directed physical exam performed given psych concern and pt stability before transfer to the ED for evaluation.  Gen: pt in NAD, angry, yelling at  provider at times when talking about returning home with family Skin: surgical sites don't look infected and well healing.     Assessment & Plan:  Greater than a total of 60 minutes with 50% of the time spent on counseling and coordination of care was spent with the patient. It was felt that the patient was unstable and still possibly actively suicidal and therefore was directly transported to the emergency room. This provider directly spoke to ER triage for an update on the patient and full transition of care.  The patient was discussed with Dr. Rogelia Boga.

## 2013-08-02 NOTE — Telephone Encounter (Signed)
Call from pt's daughter - states pt is still in a lot of pain.

## 2013-08-02 NOTE — Telephone Encounter (Signed)
Pt here now to see Dr Burtis Junes.

## 2013-08-03 DIAGNOSIS — R45851 Suicidal ideations: Secondary | ICD-10-CM | POA: Insufficient documentation

## 2013-08-03 NOTE — Assessment & Plan Note (Signed)
Pt presented with previously expressed active suicidal ideas to family members. Extensive interview and coordination of care done in clinic.  -pt transferred to ED for evaluation

## 2013-08-03 NOTE — ED Provider Notes (Signed)
I have personally seen and examined the patient.  I have discussed the plan of care with the resident.  I have reviewed the documentation on PMH/FH/Soc. History.  I have reviewed the documentation of the resident and agree.  Naarah Borgerding, MD 08/03/13 0043 

## 2013-08-05 ENCOUNTER — Other Ambulatory Visit: Payer: Self-pay | Admitting: *Deleted

## 2013-08-05 ENCOUNTER — Encounter: Payer: Self-pay | Admitting: Internal Medicine

## 2013-08-05 ENCOUNTER — Ambulatory Visit (INDEPENDENT_AMBULATORY_CARE_PROVIDER_SITE_OTHER): Payer: Medicaid Other | Admitting: Internal Medicine

## 2013-08-05 ENCOUNTER — Telehealth: Payer: Self-pay | Admitting: Licensed Clinical Social Worker

## 2013-08-05 ENCOUNTER — Ambulatory Visit: Payer: Medicaid Other

## 2013-08-05 ENCOUNTER — Other Ambulatory Visit: Payer: Medicaid Other

## 2013-08-05 VITALS — BP 160/80 | HR 90 | Temp 97.1°F | Ht 65.5 in | Wt 189.7 lb

## 2013-08-05 DIAGNOSIS — R569 Unspecified convulsions: Secondary | ICD-10-CM

## 2013-08-05 DIAGNOSIS — G40909 Epilepsy, unspecified, not intractable, without status epilepticus: Secondary | ICD-10-CM

## 2013-08-05 DIAGNOSIS — I10 Essential (primary) hypertension: Secondary | ICD-10-CM

## 2013-08-05 MED ORDER — CLONIDINE HCL 0.2 MG PO TABS: 0.2000 mg | ORAL_TABLET | Freq: Two times a day (BID) | ORAL | Status: DC

## 2013-08-05 MED ORDER — TRAMADOL HCL 50 MG PO TABS: 50.0000 mg | ORAL_TABLET | Freq: Two times a day (BID) | ORAL | Status: DC | PRN

## 2013-08-05 NOTE — Progress Notes (Signed)
Case discussed with Dr. Sadek at the time of the visit.  We reviewed the resident's history and exam and pertinent patient test results.  I agree with the assessment, diagnosis, and plan of care documented in the resident's note. 

## 2013-08-05 NOTE — Assessment & Plan Note (Signed)
Check keppra level and phenytoin level today per hospital discharge summary. Inadvertently prescribed tramadol (lowers seizure threshold), patient notified not to fill and main pharmacy also advised not to fill.

## 2013-08-05 NOTE — Patient Instructions (Addendum)
I cannot refill your strong pain medicine today. I will prescribe tramadol because this will be safer on your kidneys. The strong pain medicine has to go through your kidneys and can make you very sleepy with small doses.   We will restart one of your blood pressure medicines called clonidine. Take 1 pill in the morning and take 1 pill in the evening.   We will have personal care services sent to your house to help and nurses to fill your pill boxes.   You need to go back to see the HIV doctor to make sure you are on the correct medicines.   Come back in 1 month and call us sooner if you have problems before then at [phone].

## 2013-08-05 NOTE — Assessment & Plan Note (Signed)
BP not well controlled with lasix 40 mg daily and lisinopril 40 mg daily. Will add clonidine 0.2 mg daily.

## 2013-08-05 NOTE — Progress Notes (Signed)
Subjective:     Patient ID: Michelle [name], female   DOB: 1961/01/19, 53 y.o.   MRN: [id number]  HPI The patient is here today for refill of her pain medication. She recently had visit for suicidal ideation with ER transfer. She was treated there for pruritis. She is not endorsing suicidal ideation at today's visit. She is living at home and does not wish to live with anyone. However, she is unable to care for herself. She was offered skilled nursing facility but chose to see if family could support her at home. She is feeling okay today but having some joint pains due to the weather. She is slightly confused during our visit but has intact cognition but slowed. She accepts any help she can get at home. She admits to using bedside commode and not being able to reliably get to the bathroom. Her nephew adds to some light to the story and states that he helps to get pills ready for her and he is available to help. He states that she does use tylenol already and it takes the edge off (she initially admits to this then declines when she finds out that she may get no other pain medication since it helps). Denies fever, chills. Spoke with them about dialysis and the need to keep her BP good and her sugars good to help stop injury to her kidneys.   Review of Systems  Respiratory: Positive for shortness of breath. Negative for cough, chest tightness and wheezing.        Chronic unchanged.   Gastrointestinal: Negative for nausea, vomiting, abdominal pain and diarrhea.  Endocrine: Negative for polydipsia and polyuria.  Musculoskeletal: Positive for arthralgias and myalgias.  Neurological: Negative for dizziness, seizures and headaches.  Psychiatric/Behavioral: Positive for confusion. Negative for suicidal ideas and self-injury.       Objective:   Physical Exam  Constitutional: She appears well-developed and well-nourished.  Mild SOB  Cardiovascular: Normal rate and regular rhythm.   Pulmonary/Chest: Effort  normal and breath sounds normal. No respiratory distress. She has no wheezes. She has no rales.  Abdominal: Soft. Bowel sounds are normal. She exhibits no distension. There is no tenderness. There is no rebound.  Obese  Skin: She is not diaphoretic.       Assessment/Plan:   1. Per discussion with social worker can initiate PCS services in the meantime (it may take up to 1 month). Was given rx for tramadol in clinic by mistake. She was called and informed not to fill the prescription and her main pharmacy notified as well. Will use tylenol instead (as during the visit she claimed it helped). If family willing to pick up new rx am willing to rx oxycodone 5 mg #45 no refills to be taken 1 pill BID prn.   2. BP - clonidine 0.2 mg BID rx sent in to pharmacy as patient was on this medication previously.   3. ID/HIV - Patient to see Dr. Drue Second on 08/14/13 and will forward her a message asking her to clarify which HIV pills she is taking given the multiple on her list and duplicates. She did not have her pill bottles with her today.   4. Disposition - Follow up in 3-4 weeks for close follow up.

## 2013-08-05 NOTE — Telephone Encounter (Signed)
Return CMIS from [facility], no contact has been made with patient since Feb 2015.  [facility] has made several attempts.  CSW will inquire if pt is receptive to care management services.

## 2013-08-05 NOTE — Telephone Encounter (Signed)
CSW placed call to Ms. Case to follow up on concerns regarding community vs placement.  Pt was recently d/c to home with home health services.  Ms. Margrave declined [facility] placement.  Pt presents to [facility] with family concerns of suicidal ideation.  Pt was seen by [facility] physician and referred to ED.  Later, Ms. Callicott denied SI and was d/c back to home.  Pt is linked with [facility] care management.  CSW sent CMIS request for home visit follow up.  PCS request received today and will be initiated and forwarded to PCP.  CSW placed call to Ms. Dicocco.  CSW left message requesting return call. CSW provided contact hours and phone number.  Will request inquiry with pt today, if would like appointment with [facility].

## 2013-08-06 ENCOUNTER — Encounter: Payer: Self-pay | Admitting: Dietician

## 2013-08-06 LAB — PHENYTOIN LEVEL, TOTAL: Phenytoin Lvl: <2.5 ug/mL — ABNORMAL LOW (ref 10.0–20.0)

## 2013-08-06 NOTE — Progress Notes (Signed)
Patient ID: Michelle [name], female   DOB: [birth year], 53 y.o.   MRN: [id number] Educated patient and companion on how to use lancing device for accu chek aviva meter. Patient seemed hesitant about confidence in knowing how to use the lancing device. Companion verbalized understanding on how to use it and indicated he would be able to reinforce learning at home.  Please refer back to CDE if patient needs further training for her diabetes self care.

## 2013-08-07 ENCOUNTER — Telehealth: Payer: Self-pay | Admitting: Dietician

## 2013-08-07 DIAGNOSIS — E119 Type 2 diabetes mellitus without complications: Secondary | ICD-10-CM

## 2013-08-07 NOTE — Telephone Encounter (Signed)
Spoke with Dr. Dalphine Handing who approved 4x a day testing

## 2013-08-07 NOTE — Telephone Encounter (Signed)
Frustrated that she cannot get strips to check her blood sugar, hasn't been taking Novolog insulin because she can't check her sugar level. Doesn;t know how many times a day she is supposed to be testing or if her family has tried to get strips from the drug store.  Will request testing frequency information from her PCP,  then call pharmacy to assist her with obtaining strips. She is on lantus and Novolog before meals if needed and A1C not accurate due to anemia and renal status. Suggest 3-4 times a day testing

## 2013-08-07 NOTE — Progress Notes (Signed)
Case discussed with Dr. Kollar soon after the resident saw the patient.  We reviewed the resident's history and exam and pertinent patient test results.  I agree with the assessment, diagnosis, and plan of care documented in the resident's note. 

## 2013-08-08 ENCOUNTER — Telehealth: Payer: Self-pay | Admitting: *Deleted

## 2013-08-08 NOTE — Telephone Encounter (Signed)
Patient called c/o urinary frequency. Advised her to call her PCP at Internal Medicine, Dr. Burtis Junes and phone number provided for patient. Wendall Mola

## 2013-08-08 NOTE — Telephone Encounter (Signed)
Appt has been scheduled w/Dr Burtis Junes for tomorrow.

## 2013-08-08 NOTE — Telephone Encounter (Signed)
Agree. Rosana Berger.

## 2013-08-08 NOTE — Telephone Encounter (Signed)
Return pt's call - pt states she's having liquid stools every 30 minutes and unsure which medication may be causing this problem. Started x 3 -4 days. She has not started taking "pain medication" Tramadol yet but has started Clonidine - new medications. She wants me to call a cousin who helps her w/her medications.  I called him [phone]) - no answer.

## 2013-08-09 ENCOUNTER — Ambulatory Visit (INDEPENDENT_AMBULATORY_CARE_PROVIDER_SITE_OTHER): Payer: Medicaid Other | Admitting: Internal Medicine

## 2013-08-09 ENCOUNTER — Encounter: Payer: Self-pay | Admitting: Internal Medicine

## 2013-08-09 ENCOUNTER — Telehealth: Payer: Self-pay | Admitting: *Deleted

## 2013-08-09 VITALS — BP 150/88 | HR 91 | Temp 98.4°F | Ht 65.5 in | Wt 184.8 lb

## 2013-08-09 DIAGNOSIS — B3789 Other sites of candidiasis: Secondary | ICD-10-CM

## 2013-08-09 DIAGNOSIS — R569 Unspecified convulsions: Secondary | ICD-10-CM

## 2013-08-09 DIAGNOSIS — E119 Type 2 diabetes mellitus without complications: Secondary | ICD-10-CM

## 2013-08-09 LAB — LEVETIRACETAM LEVEL: Keppra (Levetiracetam): 85.6 ug/mL

## 2013-08-09 LAB — GLUCOSE, CAPILLARY: GLUCOSE-CAPILLARY: 83 mg/dL (ref 70–99)

## 2013-08-09 MED ORDER — INSULIN ASPART 100 UNIT/ML ~~LOC~~ SOLN: 10.0000 [IU] | Freq: Three times a day (TID) | SUBCUTANEOUS | Status: DC

## 2013-08-09 MED ORDER — NYSTATIN 100000 UNIT/GM EX POWD: CUTANEOUS | Status: DC

## 2013-08-09 MED ORDER — LEVETIRACETAM 500 MG PO TABS: 1000.0000 mg | ORAL_TABLET | Freq: Two times a day (BID) | ORAL | Status: DC

## 2013-08-09 MED ORDER — GLUCOSE BLOOD VI STRP: ORAL_STRIP | Status: DC

## 2013-08-09 MED ORDER — INSULIN GLARGINE 100 UNIT/ML ~~LOC~~ SOLN: 50.0000 [IU] | Freq: Every day | SUBCUTANEOUS | Status: DC

## 2013-08-09 NOTE — Progress Notes (Signed)
Subjective:    Patient ID: Michelle [name], female    DOB: 13-Oct-1960, 53 y.o.   MRN: [id number]  HPI Michelle [name] is a 53 yo woman pmh as listed below here for medication understanding and follow up on some labs.   Recent showed supra therapeutic keppra level and subtherapeutic phenytonin level. Pt has not had any seizures but had some worsening kidney function which maybe the exacerbating factor.   Patient seems very motivated and in tune with getting her chronic illnesses under control. She recently followed up with Dr. Marisue Humble of nephrology regarding her recent hospitalization and diagnosis of nephrotic syndrome. She has been following his recommendations and trying to make dietary changes.  Terms of her diabetes she is checking her sugars at least 4 times per day and using a sliding scale insulin of NovoLog at home. She does not bring in her meter today but needs refills on her medications. She does know her hypoglycemic symptoms and has not had any recently. She continues to have her rash on her buttock that was found during her recent hospitalization that is responding to nystatin powder.   Pt brings in all her meds for review including a large bag of 10+ bottles of prednisone and some pain medicine and HTN meds many of the bottle opened. This was taken and safely discarded during this visit.   She had been recently evaluated for suicidal ideation and auditory hallucinations she feels that both of these have subsided and is feeling better. Her sisters have stayed extended periods of time with her and she has a niece/cousin who visits her daily but yet still lives alone. The patient describes mixed feelings about this saying that she would like someone to be able to stay with her more often but "I know how to take care of myself."  Review of Systems  Constitutional: Negative for fever, chills, diaphoresis, appetite change, fatigue and unexpected weight change.  Respiratory: Negative for cough,  chest tightness and shortness of breath.   Cardiovascular: Negative for chest pain, palpitations and leg swelling.  Gastrointestinal: Negative for abdominal pain, diarrhea, constipation and abdominal distention.  Skin: Positive for rash (on her buttock that is responding to nystatin powder) and wound (on her UE bilaterally well healing and not too painful).  Neurological: Negative for tremors, seizures, syncope, speech difficulty, light-headedness and headaches.  Psychiatric/Behavioral: Negative for suicidal ideas, hallucinations, confusion, sleep disturbance, self-injury and agitation. The patient is not nervous/anxious.     Past Medical History  Diagnosis Date  . Seizures   . Stroke   . Meningitis   . HIV (human immunodeficiency virus infection)   . Hypertension   . Gout   . Muscle spasms of head and/or neck   . CKD (chronic kidney disease)   . CHF (congestive heart failure)     [name] 06/18/2013  . HCV (hepatitis C virus)     chronic/notes 06/18/2013  . Type II diabetes mellitus     [name] 06/18/2013  . AIHA (autoimmune hemolytic anemia)     [name] 06/18/2013  . Hypertensive encephalopathy ~ 05/2013    hospitalaized/notes 06/18/2013  . Daily headache     "for the last 6 years/notes 06/18/2013  . Exertional shortness of breath     [name] 06/18/2013  . Anxiety     [name] 06/18/2013  . Nephrotic syndrome   . History of syphilis   . High cholesterol    Social, surgical, family history reviewed with patient and updated in appropriate chart  locations.     Objective:   Physical Exam Filed Vitals:   08/09/13 1501  BP: 150/88  Pulse: 91  Temp: 98.4 F (36.9 C)   General: sitting in wheelchair HEENT: PERRL, EOMI, no scleral icterus Cardiac: RRR, no rubs, murmurs or gallops Pulm: clear to auscultation bilaterally, moving normal volumes of air Abd: soft, nontender, nondistended, BS present Ext: warm and well perfused, no pedal edema Neuro: alert and oriented X3, cranial nerves  II-XII grossly intact    Assessment & Plan:  Please see problem oriented charting  Pt discussed with Dr. Dalphine HandingBhardwaj

## 2013-08-09 NOTE — Telephone Encounter (Signed)
Call to [facility] to inform them that patient has an appointment to discuss her care needs.  Patient has been assigned to [facility].  Message was left for Ms. Johnican that pt is here today.  Angelina Ok, RN 08/09/2013 1:10 PM  RTC from [facility] informed her that I was aware that she has been trying to reach patient and that pt is now here for an appointment.  She agreed to talk with pt today while she is here.  Pt was given phone to talk with [facility].  Pt said that she is scheduled to meet [facility] on 08/15/2013.  Very happy that she may be receiving home services.  Angelina Ok, RN 08/09/2013 2:47 PM

## 2013-08-09 NOTE — Patient Instructions (Signed)
Please do not feel your tramadol prescription at the pharmacy as he can make your seizures worse.  We do to refill your Lantus and NovoLog insulin.  We also refilled your prescription for nystatin powder as it is helping your rash.  Continue taking your medicines as you are and keep up the good work. We have followup visits for an eye doctor and the neurologist for your seizures.

## 2013-08-10 DIAGNOSIS — B3789 Other sites of candidiasis: Secondary | ICD-10-CM | POA: Insufficient documentation

## 2013-08-10 MED ORDER — GLUCOSE BLOOD VI STRP: ORAL_STRIP | Status: DC

## 2013-08-10 MED ORDER — ACCU-CHEK SOFTCLIX LANCET DEV MISC: Status: DC

## 2013-08-10 NOTE — Assessment & Plan Note (Signed)
Medications were reviewed with the patient and PCS services have been contacted during this visit to help with medications and management.  -refills provided for lantus and novolog -refill of strips provided -referral to opthalmology provided

## 2013-08-10 NOTE — Assessment & Plan Note (Signed)
Pt keppra level was drawn and slightly supratherapeutic and phenytonin level was low. Pt has not had any seizures during this time. This maybe in the setting of changes in renal function. Tramadol prescription was cancelled and pharmacy called and pt was educated.  -lowered keppra to 1000mg  BID -educated on compliance with phyentonin  -neurology referral for f/u on antiepileptics

## 2013-08-10 NOTE — Assessment & Plan Note (Signed)
Pt was diagnosed during recent hospitalization and is continuing to improve.  -refill of nystatin powder provided  -will continue to monitor and aggressively treat DM

## 2013-08-14 ENCOUNTER — Telehealth: Payer: Self-pay | Admitting: *Deleted

## 2013-08-14 ENCOUNTER — Inpatient Hospital Stay: Payer: Medicaid Other | Admitting: Internal Medicine

## 2013-08-15 ENCOUNTER — Ambulatory Visit: Payer: Medicaid Other | Admitting: Vascular Surgery

## 2013-08-15 ENCOUNTER — Encounter (HOSPITAL_COMMUNITY): Payer: Medicaid Other

## 2013-08-16 ENCOUNTER — Other Ambulatory Visit: Payer: Self-pay | Admitting: Internal Medicine

## 2013-08-16 ENCOUNTER — Telehealth: Payer: Self-pay | Admitting: Licensed Clinical Social Worker

## 2013-08-16 ENCOUNTER — Encounter: Payer: Self-pay | Admitting: Licensed Clinical Social Worker

## 2013-08-16 DIAGNOSIS — R45851 Suicidal ideations: Secondary | ICD-10-CM

## 2013-08-16 NOTE — Telephone Encounter (Signed)
CSW received request from [facility] for pt to have Live-In Aide 24/7.  CSW placed call to Ms. Schmierer to determine pt's desire and which live-in aide pt was requesting.  CSW placed called to pt.  CSW left message requesting return call. CSW provided contact hours and phone number.

## 2013-08-16 NOTE — Telephone Encounter (Signed)
Michelle [name] returned call to CSW.  Verified that she is requesting Live-in aide/care attendant with [facility] and would like family member [name] to be designated as that person.  Form completed and returned to [facility].  Discussed behavioral health services with [name] and benefit of services.  Pt states "I'm not crazy".  CSW encouraged [name], behavioral health offers therapy services to work towards balance in one's life, coping with depression.  Pt agreeable.  CSW placed call to [facility].  Pt is scheduled for 09/12/13 at 11am.  CSW notified pt, pt satisfied and aware.  CSW will send letter out with information.

## 2013-08-16 NOTE — Progress Notes (Signed)
Case discussed with Dr. Sadek at the time of the visit.  We reviewed the resident's history and exam and pertinent patient test results.  I agree with the assessment, diagnosis, and plan of care documented in the resident's note. 

## 2013-08-19 ENCOUNTER — Encounter: Payer: Self-pay | Admitting: Licensed Clinical Social Worker

## 2013-08-19 ENCOUNTER — Encounter: Payer: Self-pay | Admitting: Internal Medicine

## 2013-08-19 ENCOUNTER — Ambulatory Visit (INDEPENDENT_AMBULATORY_CARE_PROVIDER_SITE_OTHER): Payer: Medicaid Other | Admitting: Internal Medicine

## 2013-08-19 ENCOUNTER — Telehealth: Payer: Self-pay | Admitting: *Deleted

## 2013-08-19 ENCOUNTER — Telehealth: Payer: Self-pay | Admitting: Neurology

## 2013-08-19 ENCOUNTER — Ambulatory Visit: Payer: Medicaid Other | Admitting: Neurology

## 2013-08-19 ENCOUNTER — Telehealth: Payer: Self-pay | Admitting: Internal Medicine

## 2013-08-19 ENCOUNTER — Other Ambulatory Visit: Payer: Self-pay | Admitting: Internal Medicine

## 2013-08-19 VITALS — BP 188/89 | HR 83 | Temp 97.5°F | Ht 65.5 in | Wt 169.9 lb

## 2013-08-19 DIAGNOSIS — R21 Rash and other nonspecific skin eruption: Secondary | ICD-10-CM

## 2013-08-19 DIAGNOSIS — B20 Human immunodeficiency virus [HIV] disease: Secondary | ICD-10-CM

## 2013-08-19 DIAGNOSIS — N189 Chronic kidney disease, unspecified: Secondary | ICD-10-CM

## 2013-08-19 DIAGNOSIS — B1920 Unspecified viral hepatitis C without hepatic coma: Secondary | ICD-10-CM

## 2013-08-19 DIAGNOSIS — K7460 Unspecified cirrhosis of liver: Secondary | ICD-10-CM

## 2013-08-19 LAB — COMPLETE METABOLIC PANEL WITH GFR
ALBUMIN: 3.1 g/dL — AB (ref 3.5–5.2)
ALT: 105 U/L — AB (ref 0–35)
AST: 215 U/L — ABNORMAL HIGH (ref 0–37)
Alkaline Phosphatase: 417 U/L — ABNORMAL HIGH (ref 39–117)
BUN: 25 mg/dL — AB (ref 6–23)
CALCIUM: 8.3 mg/dL — AB (ref 8.4–10.5)
CO2: 16 mEq/L — ABNORMAL LOW (ref 19–32)
Chloride: 106 mEq/L (ref 96–112)
Creat: 2.06 mg/dL — ABNORMAL HIGH (ref 0.50–1.10)
GFR, Est African American: 31 mL/min — ABNORMAL LOW
GFR, Est Non African American: 27 mL/min — ABNORMAL LOW
Glucose, Bld: 68 mg/dL — ABNORMAL LOW (ref 70–99)
POTASSIUM: 5.5 meq/L — AB (ref 3.5–5.3)
Sodium: 135 mEq/L (ref 135–145)
Total Bilirubin: 3.6 mg/dL — ABNORMAL HIGH (ref 0.2–1.2)
Total Protein: 8.1 g/dL (ref 6.0–8.3)

## 2013-08-19 LAB — GLUCOSE, CAPILLARY: GLUCOSE-CAPILLARY: 90 mg/dL (ref 70–99)

## 2013-08-19 MED ORDER — HYDROXYZINE HCL 10 MG PO TABS: 10.0000 mg | ORAL_TABLET | Freq: Three times a day (TID) | ORAL | Status: DC | PRN

## 2013-08-19 NOTE — Telephone Encounter (Signed)
Pt called with c/o itching to arms, vaginal, abd, legs back and head.  This has been going on for a few months. Usually the itching is on and off but now it's been here for a few weeks. Pt states there is no rash, just marks from her itching. Benadryl does not help, she takes up to 3 at a time.  Pt does not want to come to clinic. Was seen for this at last visit.  No new detergents etc.   Some meds have been stopped, she does not have good information. I scheduled an appointment for Friday with PCP, but wondering about a dermatology referral??  Please advise

## 2013-08-19 NOTE — Progress Notes (Signed)
Patient ID: Michelle [name], female   DOB: [birth year], 53 y.o.   MRN: [id number] CMIS message from [facility] RN, Angelina [facility] Care Manager 08/16/2013:  Home visit completed: requesting a referral for a podiatrist & dentist. I also requested a prescription for diabetic shoes. There was also a question about her lantus insulin, labetolol, & furosemide dosages. I have a made a referral to our pharmacy assistant to link her with a local pharmacy that will deliver her medications & extend credit for co-pays. She is currently using [facility] & [facility]. I have also made a referral to our dietician for nutritional counseling.

## 2013-08-19 NOTE — Telephone Encounter (Signed)
Pt is a very poor historian and would advise coming in for evaluation before referrals be made. This is of course the patient's choice if she is refusing to come into clinic, but do not feel dermatology referral is appropriate at this time. Thank you.

## 2013-08-19 NOTE — Telephone Encounter (Signed)
No show for new patient visit on 08/19/2013 

## 2013-08-19 NOTE — Progress Notes (Signed)
Subjective:     Patient ID: Michelle [name], female   DOB: [birth year], 53 y.o.   MRN: [id number]  HPI The patient is here today for rash. She was also recently treated at the ED for pruritis. Denies fever, chills. Per report, patient is taking up to 3 benadryl pills (total 75 mg) at once for itching at home. She is having more itching than rash and the rash she thinks is from scratching. This has been going on for a month or so and seems to get better and worse. She has not had any new soaps, clothes. She lives alone. She has not noticed any bugs at home. She has not been outdoors.   Review of Systems  Respiratory: Positive for shortness of breath. Negative for cough, chest tightness and wheezing.        Chronic unchanged.   Gastrointestinal: Negative for nausea, vomiting, abdominal pain and diarrhea.  Endocrine: Negative for polydipsia and polyuria.  Musculoskeletal: Positive for arthralgias and myalgias.  Skin: Positive for rash.  Neurological: Negative for dizziness, seizures and headaches.  Psychiatric/Behavioral: Positive for confusion. Negative for suicidal ideas and self-injury.       Objective:   Physical Exam  Constitutional: She appears well-developed and well-nourished.  Mild SOB  Cardiovascular: Normal rate and regular rhythm.   Pulmonary/Chest: Effort normal and breath sounds normal. No respiratory distress. She has no wheezes. She has no rales.  Abdominal: Soft. Bowel sounds are normal. She exhibits no distension. There is no tenderness. There is no rebound.  Obese  Skin: Rash noted. She is not diaphoretic.  On flexor surface some stigmata of scratching and per patient, widespread itching. No other rash available to be found despite patient telling me it is there.        Assessment/Plan:   1. Rash - Given hydroxyzine. Recent ED visit had US abdomen without gallbladder problems, had cirrhosis without obstruction or lesion. She has CKD but last BUN was 35. Last liver function  tests were abnormal and higher than her usual. She does have active hepatitis C and HIV. Will recheck CMP today. She will keep visit with PCP Friday and with infectious disease in the next week.   2. Disposition - Follow up with PCP as scheduled. Hydroxyzine rxed #30 no refills 10 mg TID prn. Advised not to take that much benadryl at once.

## 2013-08-19 NOTE — Patient Instructions (Addendum)
We are going to check a blood test on you today but we think that your liver or kidneys could be causing the itching.   We need you to stop taking so much benadryl. The most benadryl you should take is 1 pill every 6 hours. Instead we will try hydroxyzine. Take 1 pill as needed for itching up to 3 times per day.   Try not to scratch.   Come back as needed. Call with questions or problems at [phone].  Please bring your medicines with you each time you come.   Medicines may be  Eye drops  Herbal   Vitamins  Pills  Seeing these help [place] take care of you.  Please try to bring all your medicines next time. This helps [place] take good care of you and stops mistakes from medicines that could hurt you.

## 2013-08-19 NOTE — Telephone Encounter (Signed)
Pt called, asked for Hydroxyzine (prescribed earlier today at [facility] visit) to be sent to local pharmacy, instead of mail-order which she normally uses.  I've sent this prescription to [facility], as requested.  SignedJanalyn Harder, PGY3 08/19/2013, 5:38 PM

## 2013-08-21 ENCOUNTER — Other Ambulatory Visit: Payer: Self-pay | Admitting: Internal Medicine

## 2013-08-21 ENCOUNTER — Other Ambulatory Visit (HOSPITAL_COMMUNITY): Payer: Self-pay | Admitting: Internal Medicine

## 2013-08-21 ENCOUNTER — Inpatient Hospital Stay: Payer: Medicaid Other | Admitting: Internal Medicine

## 2013-08-21 NOTE — Progress Notes (Signed)
Case discussed with Dr. Kollar soon after the resident saw the patient.  We reviewed the resident's history and exam and pertinent patient test results.  I agree with the assessment, diagnosis, and plan of care documented in the resident's note. 

## 2013-08-23 ENCOUNTER — Encounter: Payer: Self-pay | Admitting: Internal Medicine

## 2013-08-23 ENCOUNTER — Ambulatory Visit (INDEPENDENT_AMBULATORY_CARE_PROVIDER_SITE_OTHER): Payer: Medicaid Other | Admitting: Internal Medicine

## 2013-08-23 ENCOUNTER — Ambulatory Visit (INDEPENDENT_AMBULATORY_CARE_PROVIDER_SITE_OTHER): Payer: Medicaid Other | Admitting: Dietician

## 2013-08-23 VITALS — BP 155/89 | HR 76 | Temp 96.5°F | Ht 65.5 in | Wt 174.0 lb

## 2013-08-23 DIAGNOSIS — E119 Type 2 diabetes mellitus without complications: Secondary | ICD-10-CM

## 2013-08-23 DIAGNOSIS — I10 Essential (primary) hypertension: Secondary | ICD-10-CM

## 2013-08-23 DIAGNOSIS — L299 Pruritus, unspecified: Secondary | ICD-10-CM

## 2013-08-23 LAB — GLUCOSE, CAPILLARY: Glucose-Capillary: 97 mg/dL (ref 70–99)

## 2013-08-23 MED ORDER — HYDROXYZINE HCL 10 MG PO TABS: 10.0000 mg | ORAL_TABLET | Freq: Three times a day (TID) | ORAL | Status: DC | PRN

## 2013-08-23 NOTE — Patient Instructions (Signed)
I sent in an email to set up Meals on Wheels. I hope you will hear form them. If not- call us and speak to our social worker about getting their number.   You amy want to ask your [facility] care manager about theri food pantry  Try the smaller syringe for your Novolog doses- If you like the smaller syringe, please call us for a prescription for them. [phone]  Please make a follow up with me in 1 month.

## 2013-08-23 NOTE — Progress Notes (Signed)
Diabetes Self-Management Education  Visit Number: First/Initial  08/23/2013 [name], identified by name and date of birth, is a 53 y.o. female with Diabetes Type: Type 2.  Other people present during visit:      ASSESSMENT  Patient Concerns:  Nutrition/Meal planning;Medication;Monitoring;Glycemic Control  Lab Results: LDL Cholesterol  Date Value Ref Range Status  05/07/2013 88  0 - 99 mg/dL Final           Hemoglobin A1C  Date Value Ref Range Status  08/02/2013 6.3   Final  05/14/2013 5.0  <5.7 % Final     (NOTE)                                                                               Family History  Problem Relation Age of Onset  . Cancer - Colon Mother   . Cancer Father   . Diabetes    . Hypertension Father    History  Substance Use Topics  . Smoking status: Former Smoker    Types: Cigarettes    Quit date: 06/19/2010  . Smokeless tobacco: Never Used  . Alcohol Use: No    Support Systems:   Special Needs:  Simplified materials  Prior DM Education:   Daily Foot Exams:   Patient Belief / Attitude about Diabetes:  Diabetes can be controlled  Assessment comments: Administered Diabetes Distress survey and patient scored low. She has had diabetes for > 20 years and always on insulin that she says it doesn't bother her. Gave her information on food pantries and requested social work assist with meals on wheels. She drew up 6.5 units of Novolog when I asked her to draw up 5. She is using 100 cc syringes that make it more difficult to draw up smaller amounts. Suggested insulin pens, but she is not ready to change. Gave her a sample fo 30 cc syringes to try using with her Novolog- she'll need a prescription for those if they help her.    Diet Recall: egg or can of vegetables or grits or oatmeal- says she is short on her supply of food. Gets 50$ in food stamps and pays her medicine copays first. Family brings her food sometimes as well.   Individualized Plan for  Diabetes Self-Management Training:  Patient individualized diabetes plan discussed today with patient and includes: what is Diabetes, Nutrition, medications, monitoring, physical activity, how to handle highs and lows, Special care for my body when I have diabetes, Dealing daily with diabetes  Education Topics Reviewed with Patient Today:  Topic Points Discussed  Disease State    Nutrition Management Role of diet in the treatment of diabetes and the relationship between the three main macronutrients and blood glucose level  Physical Activity and Exercise    Medications Taught/evaluated insulin injection, site rotation, insulin storage and needle disposal.  Monitoring    Acute Complications    Chronic Complications    Psychosocial Adjustment    Goal Setting    Preconception Care (if applicable)      PATIENTS GOALS   Learning Objective(s):     Goal The patient agrees to:  Nutrition    Physical Activity    Medications  (use 30  cc syringe for Novolog doses)  Monitoring    Problem Solving    Reducing Risk    Health Coping     Patient Self-Evaluation of Goals (Subsequent Visits)  Goal The patient rates self as meeting goals (% of time)  Nutrition    Physical Activity    Medications    Monitoring    Problem Solving    Reducing Risk    Health Coping       PERSONALIZED PLAN / SUPPORT  Self-Management Support:  Doctor's office;Family;CDE visits;Other (comment) ______________________________________________________________________  Outcomes  Expected Outcomes:  Demonstrated interest in learning. Expect positive outcomes Self-Care Barriers:  Impaired vision;Lack of transportation;Lack of material resources Education material provided: yes If problems or questions, patient to contact team via:  Phone Time in: 1330     Time out: 1420 Future DSME appointment: - 4-6 wks   Plyler, Lupita LeashDonna 08/23/2013 4:37 PM

## 2013-08-23 NOTE — Patient Instructions (Signed)
You do not need to continue the nystatin powder. Your breast rash is totally gone away.   For your itchiness I would recommend not taking any benadryl but trying Zyrtec or Claritin during the day and the hydroxizine at night.   Your blood pressure is looking wonderful so keep up the good work!

## 2013-08-23 NOTE — Progress Notes (Signed)
   Subjective:    Patient ID: Michelle [name], female    DOB: 08-Nov-1960, 53 y.o.   MRN: [id number]  HPI Michelle [name] is 53 yo woman pmh as listed below pmh as listed below presents for follow up on pruritis.   Pt was recently seen by another provider on 08/19/13 for similar complaint and was given hydroxyzine instead of pt taking benadryl (self reported of up to 4 pills BID to TID). Pt states that hydroxyzine helped dramatically and helped reduce her benadryl self administer to only 2 pills BID. Pt was educated during last visit and again this visit to discontinue benadryl use given great concern for personal safety as pt lives alone and is on a lot of medications with significant side effects. Recent lab works also showed AST:ALT 2:1 worrisome for EtOH consumption and therefore all of these medications are a concern for patient safety.   Pt states she doesn't have any more rash under her breasts. She has not noticed any visible rashes just pruritis that is diffuse in nature. She has not noticed any bed bugs in her home, has not been exposed to new detergents, clothing, or lotions. Pt is ESRD and not a candidate for permanent HD given no viable veins discovered on her last hospitalization. She has not had any fever/chills, myalgias, arthralgias, HA, blurry vision, syncope, or seizures.   In terms of her request for refill of her flexeril for presumed hand clenching she states she only has intermittent hand twinges and wants a prn flexeril. She has had no trauma to the area. Pt has a fixation with "scheduled prn medication and just having enough for the just in case." extensive education was provided for patient that this was for a previous problem and may have been appropriate at that time but doesn't have to be continued especially in light of all the other medications she is taking.   Pt brings in a log for BP that was reviewed but turned out to be CBG readings that show ranges in 120-300s with highest 400 and  lowest 78 that was asymptomatic for patient.   Review of Systems  Constitutional: Negative for fever, chills, diaphoresis, activity change and fatigue.  Cardiovascular: Negative for chest pain and leg swelling.  Gastrointestinal: Negative for nausea, abdominal pain, diarrhea and constipation.  Musculoskeletal: Negative for arthralgias and myalgias.  Skin: Negative for color change, pallor and rash.  Neurological: Negative for seizures and syncope.  Psychiatric/Behavioral: Negative for hallucinations, confusion and agitation. The patient is not nervous/anxious.       Objective:   Physical Exam Filed Vitals:   08/23/13 1341  BP: 155/89  Pulse: 76  Temp: 96.5 F (35.8 C)   General: sitting in wheelchair, NAD HEENT: no scleral icterus Cardiac: RRR, no rubs, murmurs or gallops Pulm: clear to auscultation bilaterally, moving normal volumes of air Abd: soft, nontender, nondistended, BS present Ext: warm and well perfused, no pedal edema, very dry with skin flaking most significant over hands with some cracking, no other lesions or rashes appreciated on a full body exam, no erythema or lesions under breasts, no hyper or hypopigmented lesions on skin, there is a retained suture at the end of incision without erythema, tenderness, fluctuance or induration Neuro: alert and oriented X3, cranial nerves II-XII grossly intact     Assessment & Plan:  Please see problem oriented charting  Pt discussed with Dr. Rogelia Boga

## 2013-08-24 DIAGNOSIS — L299 Pruritus, unspecified: Secondary | ICD-10-CM | POA: Insufficient documentation

## 2013-08-24 NOTE — Assessment & Plan Note (Signed)
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  -cont hydroxyzine instead of benadryl -eucerin lotion for eczema -d/c of nystatin powder  -derm referral was already placed during last visit

## 2013-08-24 NOTE — Assessment & Plan Note (Signed)
BP Readings from Last 3 Encounters:  08/23/13 155/89  08/19/13 188/89  08/09/13 150/88   Pt is achieving better control today. This has been a hard struggle with patients inability to fully comprehend complicated management regimens. Pt had clonidine added last visit.  -will continue current management

## 2013-08-24 NOTE — Assessment & Plan Note (Signed)
Pt is achieving better control as per diary that was reviewed with patient today. Retinal scan was completed. DM educator also meeting with patient after this visit.  -cont current management -cont DM educator

## 2013-08-26 ENCOUNTER — Emergency Department (HOSPITAL_COMMUNITY)
Admission: EM | Admit: 2013-08-26 | Discharge: 2013-08-26 | Disposition: A | Discharge status: Home / Self Care | Payer: Medicaid Other | Attending: Emergency Medicine | Admitting: Emergency Medicine

## 2013-08-26 ENCOUNTER — Encounter (HOSPITAL_COMMUNITY): Payer: Self-pay | Admitting: Emergency Medicine

## 2013-08-26 ENCOUNTER — Emergency Department (HOSPITAL_COMMUNITY): Payer: Medicaid Other

## 2013-08-26 DIAGNOSIS — Z794 Long term (current) use of insulin: Secondary | ICD-10-CM | POA: Insufficient documentation

## 2013-08-26 DIAGNOSIS — Z862 Personal history of diseases of the blood and blood-forming organs and certain disorders involving the immune mechanism: Secondary | ICD-10-CM | POA: Insufficient documentation

## 2013-08-26 DIAGNOSIS — Z79899 Other long term (current) drug therapy: Secondary | ICD-10-CM | POA: Insufficient documentation

## 2013-08-26 DIAGNOSIS — M25559 Pain in unspecified hip: Secondary | ICD-10-CM | POA: Insufficient documentation

## 2013-08-26 DIAGNOSIS — Z87891 Personal history of nicotine dependence: Secondary | ICD-10-CM | POA: Insufficient documentation

## 2013-08-26 DIAGNOSIS — Z8673 Personal history of transient ischemic attack (TIA), and cerebral infarction without residual deficits: Secondary | ICD-10-CM | POA: Insufficient documentation

## 2013-08-26 DIAGNOSIS — G40909 Epilepsy, unspecified, not intractable, without status epilepticus: Secondary | ICD-10-CM | POA: Insufficient documentation

## 2013-08-26 DIAGNOSIS — F411 Generalized anxiety disorder: Secondary | ICD-10-CM | POA: Insufficient documentation

## 2013-08-26 DIAGNOSIS — N189 Chronic kidney disease, unspecified: Secondary | ICD-10-CM | POA: Insufficient documentation

## 2013-08-26 DIAGNOSIS — I129 Hypertensive chronic kidney disease with stage 1 through stage 4 chronic kidney disease, or unspecified chronic kidney disease: Secondary | ICD-10-CM | POA: Insufficient documentation

## 2013-08-26 DIAGNOSIS — Z8619 Personal history of other infectious and parasitic diseases: Secondary | ICD-10-CM | POA: Insufficient documentation

## 2013-08-26 DIAGNOSIS — Z7982 Long term (current) use of aspirin: Secondary | ICD-10-CM | POA: Insufficient documentation

## 2013-08-26 DIAGNOSIS — G8929 Other chronic pain: Secondary | ICD-10-CM | POA: Insufficient documentation

## 2013-08-26 DIAGNOSIS — E119 Type 2 diabetes mellitus without complications: Secondary | ICD-10-CM | POA: Insufficient documentation

## 2013-08-26 DIAGNOSIS — Z21 Asymptomatic human immunodeficiency virus [HIV] infection status: Secondary | ICD-10-CM | POA: Insufficient documentation

## 2013-08-26 DIAGNOSIS — Z792 Long term (current) use of antibiotics: Secondary | ICD-10-CM | POA: Insufficient documentation

## 2013-08-26 DIAGNOSIS — I509 Heart failure, unspecified: Secondary | ICD-10-CM | POA: Insufficient documentation

## 2013-08-26 MED ORDER — OXYCODONE-ACETAMINOPHEN 10-325 MG PO TABS: 1.0000 | ORAL_TABLET | ORAL | Status: DC | PRN

## 2013-08-26 MED ORDER — KETOROLAC TROMETHAMINE 60 MG/2ML IM SOLN
60.0000 mg | Freq: Once | INTRAMUSCULAR | Status: AC
  Administered 2013-08-26: 60 mg via INTRAMUSCULAR
  Filled 2013-08-26: qty 2

## 2013-08-26 NOTE — ED Provider Notes (Signed)
CSN: [id number]     Arrival date & time 08/26/13  1410 History  This chart was scribed for non-physician practitioner, Irish Elders, NP working with Dagmar Hait, MD by Karle Plumber, ED scribe. This patient was seen in room TR08C/TR08C and the patient's care was started at 4:28 PM.   Chief Complaint  Patient presents with  . Hip Pain   The history is provided by the patient. No language interpreter was used.   HPI Comments: Michelle [name] is a 53 y.o. female brought in by EMS with h/o chronic pain and HIV, who presents to the Emergency Department complaining of severe right hip pain for the past 5 days. She states the pain has gotten worse since she has been laying in the bed "not doing anything" because she had surgery last week. She states she can stand to use her bedside toilet, but cannot ambulate. She denies any trauma or injury to the hip. She denies fever. She states her PCP is at [facility].   Past Medical History  Diagnosis Date  . Seizures   . Stroke   . Meningitis   . HIV (human immunodeficiency virus infection)   . Hypertension   . Gout   . Muscle spasms of head and/or neck   . CKD (chronic kidney disease)   . CHF (congestive heart failure)     Michelle [name] 06/18/2013  . HCV (hepatitis C virus)     chronic/notes 06/18/2013  . Type II diabetes mellitus     Michelle [name] 06/18/2013  . AIHA (autoimmune hemolytic anemia)     Michelle [name] 06/18/2013  . Hypertensive encephalopathy ~ 05/2013    hospitalaized/notes 06/18/2013  . Daily headache     "for the last 6 years/notes 06/18/2013  . Exertional shortness of breath     Michelle [name] 06/18/2013  . Anxiety     Michelle [name] 06/18/2013  . Nephrotic syndrome   . History of syphilis   . High cholesterol    Past Surgical History  Procedure Laterality Date  . Hip pinning Right   . Av fistula placement Right 07/24/2013    Procedure: RIGHT arm exploration of antecubital space;  Surgeon: Sherren Kerns, MD;  Location: [facility] OR;  Service: Vascular;   Laterality: Right;   Family History  Problem Relation Age of Onset  . Cancer - Colon Mother   . Cancer Father   . Diabetes    . Hypertension Father    History  Substance Use Topics  . Smoking status: Former Smoker    Types: Cigarettes    Quit date: 06/19/2010  . Smokeless tobacco: Never Used  . Alcohol Use: No   OB History   Grav Para Term Preterm Abortions TAB SAB Ect Mult Living                 Review of Systems  Musculoskeletal: Positive for arthralgias (right hip).  All other systems reviewed and are negative.   Allergies  Ceftriaxone; Norvasc; and Morphine and related  Home Medications   Current Outpatient Rx  Name  Route  Sig  Dispense  Refill  . abacavir (ZIAGEN) 300 MG tablet   Oral   Take 2 tablets (600 mg total) by mouth daily.   60 tablet   6   . abacavir-lamiVUDine (EPZICOM) 600-300 MG per tablet   Oral   Take 1 tablet by mouth daily.         [name] aspirin EC 81 MG tablet   Oral   Take 1  tablet (81 mg total) by mouth daily.   30 tablet   11   . cholecalciferol (VITAMIN D) 1000 UNITS tablet   Oral   Take 1,000 Units by mouth daily.         . cloNIDine (CATAPRES) 0.2 MG tablet   Oral   Take 1 tablet (0.2 mg total) by mouth 2 (two) times daily.   60 tablet   11   . Darunavir Ethanolate (PREZISTA) 800 MG tablet   Oral   Take 1 tablet (800 mg total) by mouth daily with breakfast.   30 tablet   6   . diphenhydrAMINE (BENADRYL) 25 mg capsule   Oral   Take 1 capsule (25 mg total) by mouth every 6 (six) hours as needed for itching.   60 capsule   0   . furosemide (LASIX) 40 MG tablet   Oral   Take 40 mg by mouth daily.         [name] gentamicin (GARAMYCIN) 0.3 % ophthalmic ointment   Both Eyes   Place 1 application into both eyes 4 (four) times daily.         [name] glucose blood (ACCU-CHEK AVIVA) test strip      Check blood sugar 4x a day before meals and bedtime dx code 250.00 insulin requiring   150 each   5   . hydrALAZINE  (APRESOLINE) 50 MG tablet      TAKE 1 TABLET BY MOUTH THREE TIMES A DAY   90 tablet   5   . hydrOXYzine (ATARAX/VISTARIL) 10 MG tablet   Oral   Take 1 tablet (10 mg total) by mouth 3 (three) times daily as needed for itching.   90 tablet   0   . insulin aspart (NOVOLOG) 100 UNIT/ML injection   Subcutaneous   Inject 10 Units into the skin 3 (three) times daily before meals. Home Sliding scale   10 mL   3   . insulin glargine (LANTUS) 100 UNIT/ML injection   Subcutaneous   Inject 0.5 mLs (50 Units total) into the skin at bedtime.   10 mL   11   . lamiVUDine (EPIVIR) 150 MG tablet   Oral   Take 1 tablet (150 mg total) by mouth daily.   30 tablet   6   . Lancet Devices (ACCU-CHEK SOFTCLIX) lancets      Check blood sugar 4x a day before meals and bedtime dx code 250.00 insulin requiring   100 each   5   . levETIRAcetam (KEPPRA) 500 MG tablet   Oral   Take 2 tablets (1,000 mg total) by mouth 2 (two) times daily.   30 tablet   11   . lisinopril (PRINIVIL,ZESTRIL) 20 MG tablet   Oral   Take 40 mg by mouth daily.         [name] lopinavir-ritonavir (KALETRA) 200-50 MG per tablet   Oral   Take 2 tablets by mouth 2 (two) times daily.         [name] LORazepam (ATIVAN) 1 MG tablet   Oral   Take 1 mg by mouth daily. Take one tablet for seizure prevention.         . pantoprazole (PROTONIX) 40 MG tablet   Oral   Take 1 tablet (40 mg total) by mouth daily.   30 tablet   11   . phenytoin (DILANTIN INFATABS) 50 MG tablet   Oral   Chew 200 mg by mouth 2 (two) times daily.         [name]  ritonavir (NORVIR) 100 MG TABS tablet   Oral   Take 100 mg by mouth daily with breakfast.          Triage Vitals: BP 157/80  Pulse 100  Temp(Src) 97.5 F (36.4 C) (Oral)  Resp 22  Ht 5' 5.5" (1.664 m)  Wt 182 lb (82.555 kg)  BMI 29.82 kg/m2  SpO2 100%  Physical Exam  Nursing note and vitals reviewed. Constitutional: She is oriented to person, place, and time. She appears  well-developed and well-nourished. No distress.  HENT:  Head: Normocephalic and atraumatic.  Eyes: EOM are normal.  Neck: Neck supple. No tracheal deviation present.  Cardiovascular: Normal rate.   Pulmonary/Chest: Effort normal. No respiratory distress.  Musculoskeletal: Normal range of motion.  Right hip pain. Limited range of motion due to patient's discomfort. No numbness or tingling. Brisk capillary refill. Distal pulses 2+. No external or internal rotation.   Neurological: She is alert and oriented to person, place, and time.  Skin: Skin is warm and dry.  Psychiatric: She has a normal mood and affect. Her behavior is normal.    ED Course  Procedures (including critical care time)  DIAGNOSTIC STUDIES: Oxygen Saturation is 100% on RA, normal by my interpretation.    COORDINATION OF CARE: 4:32 PM- Will give medication for pain and X-Henzler right hip. Pt verbalizes understanding and agrees to plan.  Medications  ketorolac (TORADOL) injection 60 mg (60 mg Intramuscular Given 08/26/13 1640)    Labs Review Labs Reviewed - No data to display Imaging Review Dg Hip Complete Right  08/26/2013   CLINICAL DATA:  Five-day history of right hip pain  EXAM: RIGHT HIP - COMPLETE 2+ VIEW  COMPARISON:  None.  FINDINGS: A single orthopedic screw is present within the right acetabulum. 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 No evidence of acute fracture or malalignment. No conventional radiographic changes to suggest avascular necrosis. Atherosclerotic calcifications noted in the pelvic vessels. Bony mineralization is within normal limits. No lytic or blastic osseous lesion.  IMPRESSION: 1. 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 2. Single orthopedic screw present within the right acetabulum.   Electronically Signed   By: Malachy MoanHeath  McCullough M.D.   On: 08/26/2013 17:48     EKG  Interpretation None      MDM   Final diagnoses:  Hip pain    Right hip x-Lampe consistent with osteoarthritis no acute injury seen. Patient reports that she is here for her chronic hip pain because she ran out of Percocet 5 days ago. Discussed plan of care with patient and she agrees. Follow up with your PCP. Prescription for oxycodone given. Patient feeling some relief after Toradol injection here. Return precautions given.  I personally performed the services described in this documentation, which was scribed in my presence. The recorded information has been reviewed and is accurate.  Irish EldersKelly Cyann Venti, NP 08/28/13 [phone]

## 2013-08-26 NOTE — Progress Notes (Signed)
Good Morning Shana,   Yes those referrals should be fine let me know if I need to put them in. Thanks.

## 2013-08-26 NOTE — ED Notes (Signed)
Patient arrives via ems, complaints of right hip pain x 1 day.  She reports the pain is probably related to her pin in her hip.  Patient states the pain radiates down her leg intermittently.  Patient states she ran out of her percocet 10/325 5 days ago.  Patient did not receive any pain meds prior to arrival

## 2013-08-26 NOTE — Discharge Instructions (Signed)
Osteoarthritis Osteoarthritis is a disease that causes soreness and swelling (inflammation) of a joint. It occurs when the cartilage at the affected joint wears down. Cartilage acts as a cushion, covering the ends of bones where they meet to form a joint. Osteoarthritis is the most common form of arthritis. It often occurs in older people. The joints affected most often by this condition include those in the:  Ends of the fingers.  Thumbs.  Neck.  Lower back.  Knees.  Hips. CAUSES  Over time, the cartilage that covers the ends of bones begins to wear away. This causes bone to rub on bone, producing pain and stiffness in the affected joints.  RISK FACTORS Certain factors can increase your chances of having osteoarthritis, including:  Older age.  Excessive body weight.  Overuse of joints. SIGNS AND SYMPTOMS   Pain, swelling, and stiffness in the joint.  Over time, the joint may lose its normal shape.  Small deposits of bone (osteophytes) may grow on the edges of the joint.  Bits of bone or cartilage can break off and float inside the joint space. This may cause more pain and damage. DIAGNOSIS  Your health care provider will do a physical exam and ask about your symptoms. Various tests may be ordered, such as:  X-rays of the affected joint.  An MRI scan.  Blood tests to rule out other types of arthritis.  Joint fluid tests. This involves using a needle to draw fluid from the joint and examining the fluid under a microscope. TREATMENT  Goals of treatment are to control pain and improve joint function. Treatment plans may include:  A prescribed exercise program that allows for rest and joint relief.  A weight control plan.  Pain relief techniques, such as:  Properly applied heat and cold.  Electric pulses delivered to nerve endings under the skin (transcutaneous electrical nerve stimulation, TENS).  Massage.  Certain nutritional supplements.  Medicines to  control pain, such as:  Acetaminophen.  Nonsteroidal anti-inflammatory drugs (NSAIDs), such as naproxen.  Narcotic or central-acting agents, such as tramadol.  Corticosteroids. These can be given orally or as an injection.  Surgery to reposition the bones and relieve pain (osteotomy) or to remove loose pieces of bone and cartilage. Joint replacement may be needed in advanced states of osteoarthritis. HOME CARE INSTRUCTIONS   Only take over-the-counter or prescription medicines as directed by your health care provider. Take all medicines exactly as instructed.  Maintain a healthy weight. Follow your health care provider's instructions for weight control. This may include dietary instructions.  Exercise as directed. Your health care provider can recommend specific types of exercise. These may include:  Strengthening exercises These are done to strengthen the muscles that support joints affected by arthritis. They can be performed with weights or with exercise bands to add resistance.  Aerobic activities These are exercises, such as brisk walking or low-impact aerobics, that get your heart pumping.  Range-of-motion activities These keep your joints limber.  Balance and agility exercises These help you maintain daily living skills.  Rest your affected joints as directed by your health care provider.  Follow up with your health care provider as directed. SEEK MEDICAL CARE IF:   Your skin turns red.  You develop a rash in addition to your joint pain.  You have worsening joint pain. SEEK IMMEDIATE MEDICAL CARE IF:  You have a significant loss of weight or appetite.  You have a fever along with joint or muscle aches.  You  have night sweats. FOR MORE INFORMATION  National Institute of Arthritis and Musculoskeletal and Skin Diseases: www.niams.http://www.myers.net/nih.gov [employer]: https://.com/www.nia.nih.gov American College of Rheumatology: www.rheumatology.org Document Released: 05/16/2005  Document Revised: 03/06/2013 Document Reviewed: 01/21/2013 [facility]Exit[facility] Patient Information 2014 [place], [place].   Follow-up with your regular doctor Take oxycodone as prescribed

## 2013-08-27 NOTE — Addendum Note (Signed)
Addended by: Neomia Dear on: 08/27/2013 08:02 AM   Modules accepted: Orders

## 2013-08-29 NOTE — Progress Notes (Signed)
Case discussed with Dr. Sadek soon after the resident saw the patient.  We reviewed the resident's history and exam and pertinent patient test results.  I agree with the assessment, diagnosis, and plan of care documented in the resident's note. 

## 2013-08-29 NOTE — ED Provider Notes (Signed)
Medical screening examination/treatment/procedure(s) were performed by non-physician practitioner and as supervising physician I was immediately available for consultation/collaboration.   EKG Interpretation None        Dagmar Hait, MD 08/29/13 [phone]

## 2013-09-04 ENCOUNTER — Other Ambulatory Visit (HOSPITAL_COMMUNITY): Payer: Self-pay | Admitting: Internal Medicine

## 2013-09-04 ENCOUNTER — Other Ambulatory Visit: Payer: Self-pay | Admitting: Internal Medicine

## 2013-09-04 ENCOUNTER — Encounter: Payer: Self-pay | Admitting: Vascular Surgery

## 2013-09-04 DIAGNOSIS — L299 Pruritus, unspecified: Secondary | ICD-10-CM

## 2013-09-04 NOTE — Telephone Encounter (Signed)
Please call the patient to confirm the three requested medications are currently being taken and at what doses.  The acyclovir is not on the list from the last encounter, the epzicom is on the last clinic visit's list but is not on the current list, and the keppra dose at the last visit is lower than what is being requested.  Thanks.

## 2013-09-05 ENCOUNTER — Encounter: Payer: Self-pay | Admitting: Vascular Surgery

## 2013-09-05 ENCOUNTER — Ambulatory Visit (INDEPENDENT_AMBULATORY_CARE_PROVIDER_SITE_OTHER): Payer: Medicaid Other | Admitting: Vascular Surgery

## 2013-09-05 ENCOUNTER — Ambulatory Visit (HOSPITAL_COMMUNITY)
Admission: RE | Admit: 2013-09-05 | Discharge: 2013-09-05 | Disposition: A | Discharge status: Home / Self Care | Payer: Medicaid Other | Source: Ambulatory Visit | Attending: Vascular Surgery | Admitting: Vascular Surgery

## 2013-09-05 VITALS — BP 169/83 | HR 80 | Resp 24 | Ht 65.0 in | Wt 175.5 lb

## 2013-09-05 DIAGNOSIS — N186 End stage renal disease: Secondary | ICD-10-CM

## 2013-09-05 DIAGNOSIS — Z01810 Encounter for preprocedural cardiovascular examination: Secondary | ICD-10-CM

## 2013-09-05 NOTE — Progress Notes (Signed)
[facility] [place] HISTORY AND PHYSICAL   History of Present Illness:  Patient is a 53 y.o. year old female who presents for placement of a permanent hemodialysis access. The patient is right handed .  The patient is not currently on hemodialysis.  The cause of renal failure is thought to be secondary to HIV and hypertension.  She had a prior attempt at a right arm fistula but no suitable veins were found. A graft was not placed at that time because the patient had recent infections.  Other chronic medical problems include seizures, CHF, dementia all of which are currently stable.  Past Medical History  Diagnosis Date  . Seizures   . Stroke   . Meningitis   . HIV (human immunodeficiency virus infection)   . Hypertension   . Gout   . Muscle spasms of head and/or neck   . CKD (chronic kidney disease)   . CHF (congestive heart failure)     [name] 06/18/2013  . HCV (hepatitis C virus)     chronic/notes 06/18/2013  . Type II diabetes mellitus     [name] 06/18/2013  . AIHA (autoimmune hemolytic anemia)     [name] 06/18/2013  . Hypertensive encephalopathy ~ 05/2013    hospitalaized/notes 06/18/2013  . Daily headache     "for the last 6 years/notes 06/18/2013  . Exertional shortness of breath     [name] 06/18/2013  . Anxiety     [name] 06/18/2013  . Nephrotic syndrome   . History of syphilis   . High cholesterol     Past Surgical History  Procedure Laterality Date  . Hip pinning Right   . Av fistula placement Right 07/24/2013    Procedure: RIGHT arm exploration of antecubital space;  Surgeon: Elam Dutch, MD;  Location: [facility] OR;  Service: Vascular;  Laterality: Right;     Social History History  Substance Use Topics  . Smoking status: Former Smoker    Types: Cigarettes    Quit date: 06/19/2010  . Smokeless tobacco: Never Used  . Alcohol Use: No    Family History Family History  Problem Relation Age of Onset  . Cancer - Colon Mother   . Cancer Father    . Hypertension Father   . Diabetes    . Diabetes Sister     Allergies  Allergies  Allergen Reactions  . 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 Norvasc [Amlodipine Besylate]     Itching, rash , hives .   [name] Morphine And Related Hives, Itching and Rash     Current Outpatient Prescriptions  Medication Sig Dispense Refill  . abacavir (ZIAGEN) 300 MG tablet Take 2 tablets (600 mg total) by mouth daily.  60 tablet  6  . abacavir-lamiVUDine (EPZICOM) 600-300 MG per tablet Take 1 tablet by mouth daily.      [name] aspirin EC 81 MG tablet Take 1 tablet (81 mg total) by mouth daily.  30 tablet  11  . Blood Glucose Monitoring Suppl (ACCU-CHEK AVIVA PLUS) W/DEVICE KIT USE AS DIRECTED  1 kit  0  . cholecalciferol (VITAMIN D) 1000 UNITS tablet Take 1,000 Units by mouth daily.      . cloNIDine (CATAPRES) 0.2 MG tablet Take 1 tablet (0.2 mg total) by mouth 2 (two) times daily.  60 tablet  11  . Darunavir Ethanolate (PREZISTA) 800 MG tablet Take 1 tablet (800 mg total) by mouth daily with breakfast.  30 tablet  6  . diphenhydrAMINE (BENADRYL) 25 mg capsule Take 1 capsule (25 mg total) by mouth every 6 (six) hours as needed for itching.  60 capsule  0  . furosemide (LASIX) 40 MG tablet Take 1 tablet (40 mg total) by mouth daily.  30 tablet  1  . gentamicin (GARAMYCIN) 0.3 % ophthalmic ointment Place 1 application into both eyes 4 (four) times daily.      . hydrOXYzine (ATARAX/VISTARIL) 10 MG tablet Take 1 tablet (10 mg total) by mouth every 8 (eight) hours as needed for itching.  90 tablet  5  . insulin aspart (NOVOLOG) 100 UNIT/ML injection Inject 10 Units into the skin 3 (three) times daily before meals. Home Sliding scale  10 mL  3  . insulin glargine (LANTUS) 100 UNIT/ML injection Inject 0.5 mLs (50 Units total) into the skin at bedtime.  10 mL  11  . lamiVUDine (EPIVIR) 150 MG tablet Take 1 tablet (150 mg total) by mouth daily.  30 tablet  6  .  levETIRAcetam (KEPPRA) 500 MG tablet Take 2 tablets (1,000 mg total) by mouth 2 (two) times daily.  30 tablet  11  . lisinopril (PRINIVIL,ZESTRIL) 40 MG tablet Take 1 tablet (40 mg total) by mouth daily.  90 tablet  3  . lopinavir-ritonavir (KALETRA) 200-50 MG per tablet Take 2 tablets by mouth 2 (two) times daily.      [name] LORazepam (ATIVAN) 1 MG tablet Take 1 mg by mouth daily. Take one tablet for seizure prevention.      [name] oxyCODONE-acetaminophen (PERCOCET) 10-325 MG per tablet Take 1 tablet by mouth every 4 (four) hours as needed for pain.  30 tablet  0  . pantoprazole (PROTONIX) 40 MG tablet Take 1 tablet (40 mg total) by mouth daily.  30 tablet  11  . phenytoin (DILANTIN INFATABS) 50 MG tablet Chew 200 mg by mouth 2 (two) times daily.      . ritonavir (NORVIR) 100 MG TABS tablet Take 100 mg by mouth daily with breakfast.      . hydrALAZINE (APRESOLINE) 50 MG tablet TAKE 1 TABLET BY MOUTH THREE TIMES A DAY  90 tablet  5   No current facility-administered medications for this visit.    ROS:   General:  No weight loss, Fever, chills  HEENT: No recent headaches, no nasal bleeding, no visual changes, no sore throat  Neurologic: No dizziness, blackouts, seizures. No recent symptoms of stroke or mini- stroke. No recent episodes of slurred speech, or temporary blindness.  Cardiac: No recent episodes of chest pain/pressure, no shortness of breath at rest.  No shortness of breath with exertion.  Denies history of atrial fibrillation or irregular heartbeat  Vascular: No history of rest pain in feet.  No history of claudication.  No history of non-healing ulcer, No history of DVT   Pulmonary: No home oxygen, no productive cough, no hemoptysis,  No asthma or wheezing  Musculoskeletal:  [ ]  Arthritis, [ ]  Low back pain,  [ ]  Joint pain  Hematologic:No history of hypercoagulable state.  No history of easy bleeding.  No history of anemia  Gastrointestinal: No hematochezia or melena,  No  gastroesophageal reflux, no trouble swallowing  Urinary: [ ]  chronic Kidney disease, [ ]  on HD - [ ]  MWF or [ ]  TTHS, [ ]  Burning with urination, [ ]  Frequent urination, [ ]  Difficulty urinating;   Skin: No rashes  Psychological: No history of anxiety,  No history of depression  Physical Examination  Filed Vitals:   09/05/13 0931  BP: 169/83  Pulse: 80  Resp: 24  Height: 5' 5"  (1.651 m)  Weight: 175 lb 8 oz (79.606 kg)    Body mass index is 29.2 kg/(m^2).  General:  Alert and oriented, no acute distress HEENT: Normal Neck: No bruit or JVD Pulmonary: Clear to auscultation bilaterally Cardiac: Regular Rate and Rhythm without murmur Gastrointestinal: Soft, non-tender, non-distended, no mass, obese Skin: No rash Extremity Pulses:  2+ radial, brachial pulses bilaterally Musculoskeletal: No deformity or edema  Neurologic: Upper and lower extremity motor 5/5 and symmetric  DATA: Vein mapping shows no suitable vein for fistula.  I reviewed and interpreted the study.   ASSESSMENT: Needs hemodialysis access.  Next option would be left arm AV graft.  Will await timing from Dr Joelyn Oms   PLAN:  See above  Ruta Hinds, MD [facility] [place] Office: [phone] Pager: [phone]

## 2013-09-07 ENCOUNTER — Other Ambulatory Visit: Payer: Self-pay

## 2013-09-07 ENCOUNTER — Observation Stay (HOSPITAL_COMMUNITY)
Admission: EM | Admit: 2013-09-07 | Discharge: 2013-09-09 | Disposition: A | Discharge status: Home / Self Care | Payer: Medicaid Other | Attending: Internal Medicine | Admitting: Internal Medicine

## 2013-09-07 ENCOUNTER — Encounter (HOSPITAL_COMMUNITY): Payer: Self-pay | Admitting: Emergency Medicine

## 2013-09-07 DIAGNOSIS — N184 Chronic kidney disease, stage 4 (severe): Secondary | ICD-10-CM | POA: Diagnosis present

## 2013-09-07 DIAGNOSIS — D638 Anemia in other chronic diseases classified elsewhere: Secondary | ICD-10-CM | POA: Diagnosis present

## 2013-09-07 DIAGNOSIS — R51 Headache: Secondary | ICD-10-CM | POA: Insufficient documentation

## 2013-09-07 DIAGNOSIS — R911 Solitary pulmonary nodule: Secondary | ICD-10-CM | POA: Insufficient documentation

## 2013-09-07 DIAGNOSIS — I10 Essential (primary) hypertension: Secondary | ICD-10-CM | POA: Diagnosis present

## 2013-09-07 DIAGNOSIS — Z91199 Patient's noncompliance with other medical treatment and regimen due to unspecified reason: Secondary | ICD-10-CM | POA: Insufficient documentation

## 2013-09-07 DIAGNOSIS — E872 Acidosis, unspecified: Secondary | ICD-10-CM | POA: Insufficient documentation

## 2013-09-07 DIAGNOSIS — Z8673 Personal history of transient ischemic attack (TIA), and cerebral infarction without residual deficits: Secondary | ICD-10-CM | POA: Insufficient documentation

## 2013-09-07 DIAGNOSIS — N183 Chronic kidney disease, stage 3 unspecified: Secondary | ICD-10-CM | POA: Insufficient documentation

## 2013-09-07 DIAGNOSIS — Z87891 Personal history of nicotine dependence: Secondary | ICD-10-CM | POA: Insufficient documentation

## 2013-09-07 DIAGNOSIS — I272 Pulmonary hypertension, unspecified: Secondary | ICD-10-CM

## 2013-09-07 DIAGNOSIS — B20 Human immunodeficiency virus [HIV] disease: Secondary | ICD-10-CM | POA: Diagnosis present

## 2013-09-07 DIAGNOSIS — D649 Anemia, unspecified: Principal | ICD-10-CM | POA: Insufficient documentation

## 2013-09-07 DIAGNOSIS — Z794 Long term (current) use of insulin: Secondary | ICD-10-CM | POA: Insufficient documentation

## 2013-09-07 DIAGNOSIS — Z79899 Other long term (current) drug therapy: Secondary | ICD-10-CM | POA: Insufficient documentation

## 2013-09-07 DIAGNOSIS — F411 Generalized anxiety disorder: Secondary | ICD-10-CM | POA: Insufficient documentation

## 2013-09-07 DIAGNOSIS — R569 Unspecified convulsions: Secondary | ICD-10-CM

## 2013-09-07 DIAGNOSIS — I509 Heart failure, unspecified: Secondary | ICD-10-CM | POA: Insufficient documentation

## 2013-09-07 DIAGNOSIS — I129 Hypertensive chronic kidney disease with stage 1 through stage 4 chronic kidney disease, or unspecified chronic kidney disease: Secondary | ICD-10-CM | POA: Insufficient documentation

## 2013-09-07 DIAGNOSIS — M109 Gout, unspecified: Secondary | ICD-10-CM | POA: Insufficient documentation

## 2013-09-07 DIAGNOSIS — I5032 Chronic diastolic (congestive) heart failure: Secondary | ICD-10-CM | POA: Insufficient documentation

## 2013-09-07 DIAGNOSIS — M5380 Other specified dorsopathies, site unspecified: Secondary | ICD-10-CM | POA: Insufficient documentation

## 2013-09-07 DIAGNOSIS — E7800 Pure hypercholesterolemia, unspecified: Secondary | ICD-10-CM | POA: Insufficient documentation

## 2013-09-07 DIAGNOSIS — D591 Autoimmune hemolytic anemia, unspecified: Secondary | ICD-10-CM

## 2013-09-07 DIAGNOSIS — N049 Nephrotic syndrome with unspecified morphologic changes: Secondary | ICD-10-CM | POA: Insufficient documentation

## 2013-09-07 DIAGNOSIS — G43909 Migraine, unspecified, not intractable, without status migrainosus: Secondary | ICD-10-CM

## 2013-09-07 DIAGNOSIS — G40909 Epilepsy, unspecified, not intractable, without status epilepticus: Secondary | ICD-10-CM | POA: Insufficient documentation

## 2013-09-07 DIAGNOSIS — E119 Type 2 diabetes mellitus without complications: Secondary | ICD-10-CM | POA: Insufficient documentation

## 2013-09-07 DIAGNOSIS — B1920 Unspecified viral hepatitis C without hepatic coma: Secondary | ICD-10-CM

## 2013-09-07 DIAGNOSIS — Z21 Asymptomatic human immunodeficiency virus [HIV] infection status: Secondary | ICD-10-CM | POA: Insufficient documentation

## 2013-09-07 DIAGNOSIS — Z9119 Patient's noncompliance with other medical treatment and regimen: Secondary | ICD-10-CM | POA: Insufficient documentation

## 2013-09-07 LAB — URINE MICROSCOPIC-ADD ON

## 2013-09-07 LAB — URINALYSIS, ROUTINE W REFLEX MICROSCOPIC
Bilirubin Urine: NEGATIVE
GLUCOSE, UA: NEGATIVE mg/dL
Hgb urine dipstick: NEGATIVE
KETONES UR: NEGATIVE mg/dL
Leukocytes, UA: NEGATIVE
Nitrite: NEGATIVE
Specific Gravity, Urine: 1.015 (ref 1.005–1.030)
UROBILINOGEN UA: 0.2 mg/dL (ref 0.0–1.0)
pH: 6 (ref 5.0–8.0)

## 2013-09-07 LAB — CBG MONITORING, ED: Glucose-Capillary: 97 mg/dL (ref 70–99)

## 2013-09-07 NOTE — ED Notes (Addendum)
Pt from home. Lives by herself. Family called EMS due to pt becoming more confused and having right arm numbness. Upon arrival, EMS found pt alert and oriented x 4, follows commands, though slowly. Pt neuro intact per EMS.  Pt became angry and defiant with EMS when they were trying to ask medical history from patient.  Pt has a history of [facility] visits, and HIV and Hep C. Pt reports that she has intermittent confusion and this is not new.

## 2013-09-07 NOTE — ED Provider Notes (Signed)
CSN: [id number]     Arrival date & time 09/07/13  2241 History   First MD Initiated Contact with Patient 09/07/13 2347     Chief Complaint  Patient presents with  . Altered Mental Status     (Consider location/radiation/quality/duration/timing/severity/associated sxs/prior Treatment) HPI Patient is a 53 yo woman with multiple chronic medical problems including HIV, seizure disorder, CKD, HTN, chronic headaches and several other co-morbidities as per her EHR.   The patient lives alone and called EMS because she had a headache. She says that the paramedic told her that her BP was high that she needed to come to the hospital. Patient says her  Head hurts "a little" right now. She denies cp and sob. She has been out of several meds including her AEDs - keppra and dilantin.   Reports normal po intake without vomiting or diarrhea.   Hs obtained directly from the patient. She is not accompanied by friends or family.   Past Medical History  Diagnosis Date  . Seizures   . Stroke   . Meningitis   . HIV (human immunodeficiency virus infection)   . Hypertension   . Gout   . Muscle spasms of head and/or neck   . CKD (chronic kidney disease)   . CHF (congestive heart failure)     [name] 06/18/2013  . HCV (hepatitis C virus)     chronic/notes 06/18/2013  . Type II diabetes mellitus     [name] 06/18/2013  . AIHA (autoimmune hemolytic anemia)     [name] 06/18/2013  . Hypertensive encephalopathy ~ 05/2013    hospitalaized/notes 06/18/2013  . Daily headache     "for the last 6 years/notes 06/18/2013  . Exertional shortness of breath     [name] 06/18/2013  . Anxiety     [name] 06/18/2013  . Nephrotic syndrome   . History of syphilis   . High cholesterol    Past Surgical History  Procedure Laterality Date  . Hip pinning Right   . Av fistula placement Right 07/24/2013    Procedure: RIGHT arm exploration of antecubital space;  Surgeon: Elam Dutch, MD;  Location: [facility] OR;  Service: Vascular;   Laterality: Right;   Family History  Problem Relation Age of Onset  . Cancer - Colon Mother   . Cancer Father   . Hypertension Father   . Diabetes    . Diabetes Sister    History  Substance Use Topics  . Smoking status: Former Smoker    Types: Cigarettes    Quit date: 06/19/2010  . Smokeless tobacco: Never Used  . Alcohol Use: No   OB History   Grav Para Term Preterm Abortions TAB SAB Ect Mult Living                 Review of Systems  Ten point review of symptoms performed and is negative with the exception of symptoms noted above.    Allergies  Ceftriaxone; Norvasc; and Morphine and related  Home Medications   Current Outpatient Rx  Name  Route  Sig  Dispense  Refill  . abacavir (ZIAGEN) 300 MG tablet   Oral   Take 2 tablets (600 mg total) by mouth daily.   60 tablet   6   . aspirin EC 81 MG tablet   Oral   Take 1 tablet (81 mg total) by mouth daily.   30 tablet   11   . cholecalciferol (VITAMIN D) 1000 UNITS tablet   Oral   Take  1,000 Units by mouth daily.         . cloNIDine (CATAPRES) 0.2 MG tablet   Oral   Take 1 tablet (0.2 mg total) by mouth 2 (two) times daily.   60 tablet   11   . Darunavir Ethanolate (PREZISTA) 800 MG tablet   Oral   Take 1 tablet (800 mg total) by mouth daily with breakfast.   30 tablet   6   . furosemide (LASIX) 40 MG tablet   Oral   Take 1 tablet (40 mg total) by mouth daily.   30 tablet   1   . gentamicin (GARAMYCIN) 0.3 % ophthalmic ointment   Both Eyes   Place 1 application into both eyes 4 (four) times daily.         . hydrALAZINE (APRESOLINE) 50 MG tablet   Oral   Take 50 mg by mouth 3 (three) times daily.         . hydrOXYzine (ATARAX/VISTARIL) 10 MG tablet   Oral   Take 1 tablet (10 mg total) by mouth every 8 (eight) hours as needed for itching.   90 tablet   5   . insulin aspart (NOVOLOG) 100 UNIT/ML injection   Subcutaneous   Inject 10 Units into the skin 3 (three) times daily before  meals. Home Sliding scale   10 mL   3   . insulin glargine (LANTUS) 100 UNIT/ML injection   Subcutaneous   Inject 30 Units into the skin at bedtime.         [name] lamiVUDine (EPIVIR) 150 MG tablet   Oral   Take 1 tablet (150 mg total) by mouth daily.   30 tablet   6   . levETIRAcetam (KEPPRA) 500 MG tablet   Oral   Take 2 tablets (1,000 mg total) by mouth 2 (two) times daily.   30 tablet   11   . lisinopril (PRINIVIL,ZESTRIL) 40 MG tablet   Oral   Take 1 tablet (40 mg total) by mouth daily.   90 tablet   3   . lopinavir-ritonavir (KALETRA) 200-50 MG per tablet   Oral   Take 2 tablets by mouth 2 (two) times daily.         [name] LORazepam (ATIVAN) 1 MG tablet   Oral   Take 1 mg by mouth daily. Take one tablet for seizure prevention.         [name] oxyCODONE-acetaminophen (PERCOCET) 10-325 MG per tablet   Oral   Take 1 tablet by mouth every 4 (four) hours as needed for pain.   30 tablet   0   . pantoprazole (PROTONIX) 40 MG tablet   Oral   Take 1 tablet (40 mg total) by mouth daily.   30 tablet   11   . phenytoin (DILANTIN INFATABS) 50 MG tablet   Oral   Chew 200 mg by mouth 2 (two) times daily.         . ritonavir (NORVIR) 100 MG TABS tablet   Oral   Take 100 mg by mouth daily with breakfast.         . Blood Glucose Monitoring Suppl (ACCU-CHEK AVIVA PLUS) W/DEVICE KIT      USE AS DIRECTED   1 kit   0    BP 163/78  Pulse 82  Temp(Src) 99.3 F (37.4 C) (Oral)  Resp 22  Ht _0  (1.651 m)  Wt 158 lb 3 oz (71.753 kg)  BMI 26.32 kg/m2  SpO2 100%  Physical Exam\ Gen: well developed and well nourished appearing Head: NCAT Eyes: PERL, EOMI Nose: no epistaixis or rhinorrhea Mouth/throat: mucosa is moist and pink Neck: supple, no stridor Lungs: CTA B, no wheezing, rhonchi or rales CV: RRR, no murmur, extremities appear well perfused.  Abd: soft, notender, nondistended Back: no ttp, no cva ttp Skin: warm and dry Ext: normal to inspection, 1+ symmetric  pretibial edema Neuro: CN ii-xii grossly intact, no focal deficits Psyche; normal affect, slow response to questions, seems to become confused at times. calm and cooperative.   ED Course  Procedures (including critical care time) Labs Review  Results for orders placed during the hospital encounter of 09/07/13 (from the past 24 hour(s))  URINALYSIS, ROUTINE W REFLEX MICROSCOPIC     Status: Abnormal   Collection Time    09/07/13 11:13 PM      Result Value Ref Range   Color, Urine YELLOW  YELLOW   APPearance CLEAR  CLEAR   Specific Gravity, Urine 1.015  1.005 - 1.030   pH 6.0  5.0 - 8.0   Glucose, UA NEGATIVE  NEGATIVE mg/dL   Hgb urine dipstick NEGATIVE  NEGATIVE   Bilirubin Urine NEGATIVE  NEGATIVE   Ketones, ur NEGATIVE  NEGATIVE mg/dL   Protein, ur >300 (*) NEGATIVE mg/dL   Urobilinogen, UA 0.2  0.0 - 1.0 mg/dL   Nitrite NEGATIVE  NEGATIVE   Leukocytes, UA NEGATIVE  NEGATIVE  URINE MICROSCOPIC-ADD ON     Status: Abnormal   Collection Time    09/07/13 11:13 PM      Result Value Ref Range   Squamous Epithelial / LPF RARE  RARE   WBC, UA 3-6  <3 WBC/hpf   RBC / HPF 0-2  <3 RBC/hpf   Bacteria, UA RARE  RARE   Casts HYALINE CASTS (*) NEGATIVE  CBG MONITORING, ED     Status: None   Collection Time    09/07/13 11:46 PM      Result Value Ref Range   Glucose-Capillary 97  70 - 99 mg/dL  CBC     Status: Abnormal   Collection Time    09/08/13 12:11 AM      Result Value Ref Range   WBC 4.5  4.0 - 10.5 K/uL   RBC 1.76 (*) 3.87 - 5.11 MIL/uL   Hemoglobin 6.2 (*) 12.0 - 15.0 g/dL   HCT 18.1 (*) 36.0 - 46.0 %   MCV 102.8 (*) 78.0 - 100.0 fL   MCH 35.2 (*) 26.0 - 34.0 pg   MCHC 34.3  30.0 - 36.0 g/dL   RDW 15.4  11.5 - 15.5 %   Platelets 194  150 - 400 K/uL  COMPREHENSIVE METABOLIC PANEL     Status: Abnormal   Collection Time    09/08/13 12:11 AM      Result Value Ref Range   Sodium 139  137 - 147 mEq/L   Potassium 5.3  3.7 - 5.3 mEq/L   Chloride 109  96 - 112 mEq/L   CO2  13 (*) 19 - 32 mEq/L   Glucose, Bld 96  70 - 99 mg/dL   BUN 24 (*) 6 - 23 mg/dL   Creatinine, Ser 1.52 (*) 0.50 - 1.10 mg/dL   Calcium 8.3 (*) 8.4 - 10.5 mg/dL   Total Protein 7.5  6.0 - 8.3 g/dL   Albumin 2.2 (*) 3.5 - 5.2 g/dL   AST 44 (*) 0 - 37 U/L   ALT 26  0 - 35  U/L   Alkaline Phosphatase 244 (*) 39 - 117 U/L   Total Bilirubin 0.5  0.3 - 1.2 mg/dL   GFR calc non Af Amer 38 (*) >90 mL/min   GFR calc Af Amer 44 (*) >90 mL/min   EKG: nsr, no acute ischemic changes, normal intervals, normal axis, normal qrs complex  MDM   Patient with symptomatic anemia - history of hemolytic anemia. Will admit to transfuse. Observation.    Elyn Peers, MD 09/08/13 [phone]

## 2013-09-08 ENCOUNTER — Observation Stay (HOSPITAL_COMMUNITY): Payer: Medicaid Other

## 2013-09-08 ENCOUNTER — Encounter (HOSPITAL_COMMUNITY): Payer: Self-pay | Admitting: *Deleted

## 2013-09-08 ENCOUNTER — Emergency Department (HOSPITAL_COMMUNITY): Payer: Medicaid Other

## 2013-09-08 DIAGNOSIS — I129 Hypertensive chronic kidney disease with stage 1 through stage 4 chronic kidney disease, or unspecified chronic kidney disease: Secondary | ICD-10-CM

## 2013-09-08 DIAGNOSIS — R51 Headache: Secondary | ICD-10-CM

## 2013-09-08 DIAGNOSIS — B20 Human immunodeficiency virus [HIV] disease: Secondary | ICD-10-CM

## 2013-09-08 DIAGNOSIS — N183 Chronic kidney disease, stage 3 unspecified: Secondary | ICD-10-CM

## 2013-09-08 DIAGNOSIS — D65 Disseminated intravascular coagulation [defibrination syndrome]: Secondary | ICD-10-CM

## 2013-09-08 DIAGNOSIS — D591 Autoimmune hemolytic anemia, unspecified: Secondary | ICD-10-CM

## 2013-09-08 DIAGNOSIS — B1920 Unspecified viral hepatitis C without hepatic coma: Secondary | ICD-10-CM

## 2013-09-08 DIAGNOSIS — N189 Chronic kidney disease, unspecified: Secondary | ICD-10-CM

## 2013-09-08 DIAGNOSIS — D631 Anemia in chronic kidney disease: Secondary | ICD-10-CM

## 2013-09-08 DIAGNOSIS — I509 Heart failure, unspecified: Secondary | ICD-10-CM

## 2013-09-08 DIAGNOSIS — E119 Type 2 diabetes mellitus without complications: Secondary | ICD-10-CM

## 2013-09-08 DIAGNOSIS — D649 Anemia, unspecified: Secondary | ICD-10-CM

## 2013-09-08 DIAGNOSIS — R42 Dizziness and giddiness: Secondary | ICD-10-CM

## 2013-09-08 DIAGNOSIS — I10 Essential (primary) hypertension: Secondary | ICD-10-CM

## 2013-09-08 DIAGNOSIS — R911 Solitary pulmonary nodule: Secondary | ICD-10-CM

## 2013-09-08 DIAGNOSIS — G40909 Epilepsy, unspecified, not intractable, without status epilepticus: Secondary | ICD-10-CM

## 2013-09-08 DIAGNOSIS — I5030 Unspecified diastolic (congestive) heart failure: Secondary | ICD-10-CM

## 2013-09-08 DIAGNOSIS — N039 Chronic nephritic syndrome with unspecified morphologic changes: Secondary | ICD-10-CM

## 2013-09-08 LAB — CBC
HEMATOCRIT: 18.1 % — AB (ref 36.0–46.0)
Hemoglobin: 6.2 g/dL — CL (ref 12.0–15.0)
MCH: 35.2 pg — ABNORMAL HIGH (ref 26.0–34.0)
MCHC: 34.3 g/dL (ref 30.0–36.0)
MCV: 102.8 fL — ABNORMAL HIGH (ref 78.0–100.0)
Platelets: 194 10*3/uL (ref 150–400)
RBC: 1.76 MIL/uL — AB (ref 3.87–5.11)
RDW: 15.4 % (ref 11.5–15.5)
WBC: 4.5 10*3/uL (ref 4.0–10.5)

## 2013-09-08 LAB — BASIC METABOLIC PANEL
BUN: 20 mg/dL (ref 6–23)
CALCIUM: 8.4 mg/dL (ref 8.4–10.5)
CHLORIDE: 109 meq/L (ref 96–112)
CO2: 13 mEq/L — ABNORMAL LOW (ref 19–32)
Creatinine, Ser: 1.43 mg/dL — ABNORMAL HIGH (ref 0.50–1.10)
GFR calc non Af Amer: 41 mL/min — ABNORMAL LOW (ref >90)
GFR, EST AFRICAN AMERICAN: 47 mL/min — AB (ref >90)
Glucose, Bld: 202 mg/dL — ABNORMAL HIGH (ref 70–99)
Potassium: 5 mEq/L (ref 3.7–5.3)
Sodium: 139 mEq/L (ref 137–147)

## 2013-09-08 LAB — RAPID URINE DRUG SCREEN, HOSP PERFORMED
AMPHETAMINES: NOT DETECTED
BENZODIAZEPINES: NOT DETECTED
Barbiturates: NOT DETECTED
Cocaine: NOT DETECTED
Opiates: NOT DETECTED
TETRAHYDROCANNABINOL: NOT DETECTED

## 2013-09-08 LAB — COMPREHENSIVE METABOLIC PANEL
ALBUMIN: 2.2 g/dL — AB (ref 3.5–5.2)
ALK PHOS: 244 U/L — AB (ref 39–117)
ALT: 26 U/L (ref 0–35)
AST: 44 U/L — ABNORMAL HIGH (ref 0–37)
BILIRUBIN TOTAL: 0.5 mg/dL (ref 0.3–1.2)
BUN: 24 mg/dL — AB (ref 6–23)
CHLORIDE: 109 meq/L (ref 96–112)
CO2: 13 meq/L — AB (ref 19–32)
CREATININE: 1.52 mg/dL — AB (ref 0.50–1.10)
Calcium: 8.3 mg/dL — ABNORMAL LOW (ref 8.4–10.5)
GFR calc Af Amer: 44 mL/min — ABNORMAL LOW (ref >90)
GFR, EST NON AFRICAN AMERICAN: 38 mL/min — AB (ref >90)
Glucose, Bld: 96 mg/dL (ref 70–99)
POTASSIUM: 5.3 meq/L (ref 3.7–5.3)
Sodium: 139 mEq/L (ref 137–147)
Total Protein: 7.5 g/dL (ref 6.0–8.3)

## 2013-09-08 LAB — HAPTOGLOBIN: Haptoglobin: 84 mg/dL (ref 45–215)

## 2013-09-08 LAB — GLUCOSE, CAPILLARY
GLUCOSE-CAPILLARY: 189 mg/dL — AB (ref 70–99)
GLUCOSE-CAPILLARY: 137 mg/dL — AB (ref 70–99)
Glucose-Capillary: 78 mg/dL (ref 70–99)
Glucose-Capillary: 175 mg/dL — ABNORMAL HIGH (ref 70–99)

## 2013-09-08 LAB — PHENYTOIN LEVEL, TOTAL: Phenytoin Lvl: <2.5 ug/mL — ABNORMAL LOW (ref 10.0–20.0)

## 2013-09-08 LAB — SALICYLATE LEVEL

## 2013-09-08 LAB — PREPARE RBC (CROSSMATCH)

## 2013-09-08 LAB — MRSA PCR SCREENING: MRSA by PCR: NEGATIVE

## 2013-09-08 LAB — TROPONIN I: Troponin I: <0.3 ng/mL (ref <0.30)

## 2013-09-08 LAB — LACTIC ACID, PLASMA: Lactic Acid, Venous: 1.9 mmol/L (ref 0.5–2.2)

## 2013-09-08 LAB — SAVE SMEAR

## 2013-09-08 LAB — LACTATE DEHYDROGENASE: LDH: 263 U/L — AB (ref 94–250)

## 2013-09-08 MED ORDER — PREDNISONE 20 MG PO TABS
20.0000 mg | ORAL_TABLET | Freq: Every day | ORAL | Status: DC
  Administered 2013-09-08: 20 mg via ORAL
  Filled 2013-09-08 (×2): qty 1

## 2013-09-08 MED ORDER — FUROSEMIDE 40 MG PO TABS
40.0000 mg | ORAL_TABLET | Freq: Every day | ORAL | Status: DC
Start: 2013-09-08 — End: 2013-09-09
  Administered 2013-09-08 – 2013-09-09 (×2): 40 mg via ORAL
  Filled 2013-09-08 (×2): qty 1

## 2013-09-08 MED ORDER — CLONIDINE HCL 0.2 MG PO TABS
0.2000 mg | ORAL_TABLET | Freq: Two times a day (BID) | ORAL | Status: DC
  Filled 2013-09-08 (×2): qty 1

## 2013-09-08 MED ORDER — ABACAVIR SULFATE 300 MG PO TABS
600.0000 mg | ORAL_TABLET | Freq: Every day | ORAL | Status: DC
  Administered 2013-09-08 – 2013-09-09 (×2): 600 mg via ORAL
  Filled 2013-09-08 (×2): qty 2

## 2013-09-08 MED ORDER — LABETALOL HCL 100 MG PO TABS
100.0000 mg | ORAL_TABLET | Freq: Two times a day (BID) | ORAL | Status: DC
  Administered 2013-09-08 – 2013-09-09 (×3): 100 mg via ORAL
  Filled 2013-09-08 (×5): qty 1

## 2013-09-08 MED ORDER — PREDNISONE 20 MG PO TABS
40.0000 mg | ORAL_TABLET | Freq: Every day | ORAL | Status: DC
  Administered 2013-09-09: 40 mg via ORAL
  Filled 2013-09-08 (×2): qty 2

## 2013-09-08 MED ORDER — INSULIN GLARGINE 100 UNIT/ML ~~LOC~~ SOLN
20.0000 [IU] | Freq: Every day | SUBCUTANEOUS | Status: DC
  Filled 2013-09-08: qty 0.2

## 2013-09-08 MED ORDER — HYDRALAZINE HCL 50 MG PO TABS
50.0000 mg | ORAL_TABLET | Freq: Three times a day (TID) | ORAL | Status: DC
  Administered 2013-09-08 – 2013-09-09 (×5): 50 mg via ORAL
  Filled 2013-09-08 (×7): qty 1

## 2013-09-08 MED ORDER — CLONIDINE HCL 0.2 MG PO TABS
0.2000 mg | ORAL_TABLET | Freq: Two times a day (BID) | ORAL | Status: DC
  Administered 2013-09-08 – 2013-09-09 (×3): 0.2 mg via ORAL
  Filled 2013-09-08 (×4): qty 1

## 2013-09-08 MED ORDER — SODIUM CHLORIDE 0.9 % IJ SOLN: 3.0000 mL | Freq: Two times a day (BID) | INTRAMUSCULAR | Status: DC

## 2013-09-08 MED ORDER — INSULIN ASPART 100 UNIT/ML ~~LOC~~ SOLN: 0.0000 [IU] | Freq: Every day | SUBCUTANEOUS | Status: DC

## 2013-09-08 MED ORDER — TOBRAMYCIN 0.3 % OP SOLN
1.0000 [drp] | OPHTHALMIC | Status: DC
  Administered 2013-09-08 – 2013-09-09 (×8): 1 [drp] via OPHTHALMIC
  Filled 2013-09-08: qty 5

## 2013-09-08 MED ORDER — PHENYTOIN 50 MG PO CHEW
200.0000 mg | CHEWABLE_TABLET | Freq: Two times a day (BID) | ORAL | Status: DC
  Administered 2013-09-08 – 2013-09-09 (×3): 200 mg via ORAL
  Filled 2013-09-08 (×4): qty 4

## 2013-09-08 MED ORDER — INSULIN GLARGINE 100 UNIT/ML ~~LOC~~ SOLN
30.0000 [IU] | Freq: Every day | SUBCUTANEOUS | Status: DC
  Administered 2013-09-08 – 2013-09-09 (×2): 30 [IU] via SUBCUTANEOUS
  Filled 2013-09-08 (×2): qty 0.3

## 2013-09-08 MED ORDER — INSULIN GLARGINE 100 UNIT/ML ~~LOC~~ SOLN: 30.0000 [IU] | Freq: Every day | SUBCUTANEOUS | Status: DC

## 2013-09-08 MED ORDER — LISINOPRIL 40 MG PO TABS
40.0000 mg | ORAL_TABLET | Freq: Every day | ORAL | Status: DC
  Administered 2013-09-08 – 2013-09-09 (×2): 40 mg via ORAL
  Filled 2013-09-08 (×2): qty 1

## 2013-09-08 MED ORDER — HYDRALAZINE HCL 50 MG PO TABS: 50.0000 mg | ORAL_TABLET | Freq: Three times a day (TID) | ORAL | Status: DC

## 2013-09-08 MED ORDER — LAMIVUDINE 150 MG PO TABS
150.0000 mg | ORAL_TABLET | Freq: Every day | ORAL | Status: DC
  Administered 2013-09-08 – 2013-09-09 (×2): 150 mg via ORAL
  Filled 2013-09-08 (×2): qty 1

## 2013-09-08 MED ORDER — PANTOPRAZOLE SODIUM 40 MG PO TBEC
40.0000 mg | DELAYED_RELEASE_TABLET | Freq: Every day | ORAL | Status: DC
  Administered 2013-09-08 – 2013-09-09 (×2): 40 mg via ORAL
  Filled 2013-09-08 (×2): qty 1

## 2013-09-08 MED ORDER — LOPINAVIR-RITONAVIR 200-50 MG PO TABS
2.0000 | ORAL_TABLET | Freq: Two times a day (BID) | ORAL | Status: DC
  Administered 2013-09-08 – 2013-09-09 (×3): 2 via ORAL
  Filled 2013-09-08 (×4): qty 2

## 2013-09-08 MED ORDER — SODIUM CHLORIDE 0.9 % IV SOLN: 250.0000 mL | INTRAVENOUS | Status: DC | PRN

## 2013-09-08 MED ORDER — LEVETIRACETAM 500 MG PO TABS
500.0000 mg | ORAL_TABLET | Freq: Two times a day (BID) | ORAL | Status: DC
Start: 2013-09-08 — End: 2013-09-09
  Administered 2013-09-08 – 2013-09-09 (×3): 500 mg via ORAL
  Filled 2013-09-08 (×4): qty 1

## 2013-09-08 MED ORDER — LORAZEPAM 1 MG PO TABS
1.0000 mg | ORAL_TABLET | Freq: Every day | ORAL | Status: DC
  Administered 2013-09-08 – 2013-09-09 (×2): 1 mg via ORAL
  Filled 2013-09-08 (×2): qty 1

## 2013-09-08 MED ORDER — INSULIN ASPART 100 UNIT/ML ~~LOC~~ SOLN
0.0000 [IU] | Freq: Three times a day (TID) | SUBCUTANEOUS | Status: DC
  Administered 2013-09-08 (×2): 2 [IU] via SUBCUTANEOUS
  Administered 2013-09-09: 5 [IU] via SUBCUTANEOUS

## 2013-09-08 MED ORDER — HYDROCODONE-ACETAMINOPHEN 10-325 MG PO TABS
1.0000 | ORAL_TABLET | Freq: Four times a day (QID) | ORAL | Status: DC | PRN
  Administered 2013-09-08 – 2013-09-09 (×4): 1 via ORAL
  Filled 2013-09-08 (×4): qty 1

## 2013-09-08 MED ORDER — HYDROXYZINE HCL 10 MG PO TABS
10.0000 mg | ORAL_TABLET | Freq: Three times a day (TID) | ORAL | Status: DC | PRN
Start: 2013-09-08 — End: 2013-09-09

## 2013-09-08 MED ORDER — SODIUM CHLORIDE 0.9 % IJ SOLN: 3.0000 mL | INTRAMUSCULAR | Status: DC | PRN

## 2013-09-08 MED ORDER — VITAMIN D3 25 MCG (1000 UNIT) PO TABS
1000.0000 [IU] | ORAL_TABLET | Freq: Every day | ORAL | Status: DC
  Administered 2013-09-08 – 2013-09-09 (×2): 1000 [IU] via ORAL
  Filled 2013-09-08 (×2): qty 1

## 2013-09-08 NOTE — Progress Notes (Signed)
UR Completed.  Krissie Merrick Jane Sephira Zellman [phone] 09/08/2013  

## 2013-09-08 NOTE — H&P (Signed)
Date: 09/08/2013               Patient Name:  Michelle [name] MRN: [id number]  DOB: November 29, 1960 Age / Sex: 53 y.o., female   PCP: Michelle Gallant, MD         Medical Service: Internal Medicine Teaching Service         Attending Physician: Dr. Axel Filler, MD    First Contact: Dr. Ronnald [name] Pager: [phone]  Second Contact: Dr. Eula [name] Pager: [phone]       After Hours (After 5p/  First Contact Pager: [phone]  weekends / holidays): Second Contact Pager: [phone]   Chief Complaint: headache  History of Present Illness:  Michelle [name] is a 53 year old woman with history of autoimmune hemolytic anemia, seizure disorder, HTN, DM2, dCHF (grade 2), HIV, chronic HCV, chronic headaches who presents with headache, found to be anemic (Hgb 6.2) thus admitted for transfusion.    Patient is an extremely poor historian.  She states she woke up from a nap with a headache on 4/11.  She only had one Percocet left so since her head was hurting, and she did not want to take her last pill, her cousin called EMS.  Patient states EMS told her that her blood pressure was too high so she needed to go to ED, and this is why she came to the hospital.  Of note, patient has history of daily headaches x 6 years, and yesterday's headache was no different than her typical headaches (right side of her head and "in the sinuses"), no associated symptoms.  She was resting comfortably prior to admission interview but then repeatedly requesting Percocet "10s".  Patient reported poor medication compliance in ED but reported good medication compliance with use of pill box during admission interview.  However, she does not know the names of any of her medications, unable to state what any medication is used for.  She is in the process of moving in with her cousin.   Denies chest pain, palpitations, shortness of breath, cough, nausea, leg swelling, difficulty speaking, focal weakness, numbness/tingling, LOC; no change in bowel or bladder habits  (no blood in stool).    Meds: No current facility-administered medications for this encounter.   Current Outpatient Prescriptions  Medication Sig Dispense Refill  . abacavir (ZIAGEN) 300 MG tablet Take 2 tablets (600 mg total) by mouth daily.  60 tablet  6  . aspirin EC 81 MG tablet Take 1 tablet (81 mg total) by mouth daily.  30 tablet  11  . cholecalciferol (VITAMIN D) 1000 UNITS tablet Take 1,000 Units by mouth daily.      . cloNIDine (CATAPRES) 0.2 MG tablet Take 1 tablet (0.2 mg total) by mouth 2 (two) times daily.  60 tablet  11  . Darunavir Ethanolate (PREZISTA) 800 MG tablet Take 1 tablet (800 mg total) by mouth daily with breakfast.  30 tablet  6  . furosemide (LASIX) 40 MG tablet Take 1 tablet (40 mg total) by mouth daily.  30 tablet  1  . gentamicin (GARAMYCIN) 0.3 % ophthalmic ointment Place 1 application into both eyes 4 (four) times daily.      . hydrALAZINE (APRESOLINE) 50 MG tablet Take 50 mg by mouth 3 (three) times daily.      . hydrOXYzine (ATARAX/VISTARIL) 10 MG tablet Take 1 tablet (10 mg total) by mouth every 8 (eight) hours as needed for itching.  90 tablet  5  . insulin aspart (NOVOLOG) 100 UNIT/ML  injection Inject 10 Units into the skin 3 (three) times daily before meals. Home Sliding scale  10 mL  3  . insulin glargine (LANTUS) 100 UNIT/ML injection Inject 30 Units into the skin at bedtime.      [name] lamiVUDine (EPIVIR) 150 MG tablet Take 1 tablet (150 mg total) by mouth daily.  30 tablet  6  . levETIRAcetam (KEPPRA) 500 MG tablet Take 2 tablets (1,000 mg total) by mouth 2 (two) times daily.  30 tablet  11  . lisinopril (PRINIVIL,ZESTRIL) 40 MG tablet Take 1 tablet (40 mg total) by mouth daily.  90 tablet  3  . lopinavir-ritonavir (KALETRA) 200-50 MG per tablet Take 2 tablets by mouth 2 (two) times daily.      [name] LORazepam (ATIVAN) 1 MG tablet Take 1 mg by mouth daily. Take one tablet for seizure prevention.      [name] oxyCODONE-acetaminophen (PERCOCET) 10-325 MG per tablet  Take 1 tablet by mouth every 4 (four) hours as needed for pain.  30 tablet  0  . pantoprazole (PROTONIX) 40 MG tablet Take 1 tablet (40 mg total) by mouth daily.  30 tablet  11  . phenytoin (DILANTIN INFATABS) 50 MG tablet Chew 200 mg by mouth 2 (two) times daily.      . ritonavir (NORVIR) 100 MG TABS tablet Take 100 mg by mouth daily with breakfast.      . Blood Glucose Monitoring Suppl (ACCU-CHEK AVIVA PLUS) W/DEVICE KIT USE AS DIRECTED  1 kit  0    Allergies: Allergies as of 09/07/2013 - Review Complete 09/07/2013  Allergen Reaction Noted  . Ceftriaxone  06/01/2013  . Norvasc [amlodipine besylate]  03/30/2013  . Morphine and related Hives, Itching, and Rash 02/15/2013   Past Medical History  Diagnosis Date  . Seizures   . Stroke   . Meningitis   . HIV (human immunodeficiency virus infection)   . Hypertension   . Gout   . Muscle spasms of head and/or neck   . CKD (chronic kidney disease)   . CHF (congestive heart failure)     Michelle [name] 06/18/2013  . HCV (hepatitis C virus)     chronic/notes 06/18/2013  . Type II diabetes mellitus     Michelle [name] 06/18/2013  . AIHA (autoimmune hemolytic anemia)     Michelle [name] 06/18/2013  . Hypertensive encephalopathy ~ 05/2013    hospitalaized/notes 06/18/2013  . Daily headache     "for the last 6 years/notes 06/18/2013  . Exertional shortness of breath     Michelle [name] 06/18/2013  . Anxiety     Michelle [name] 06/18/2013  . Nephrotic syndrome   . History of syphilis   . High cholesterol    Past Surgical History  Procedure Laterality Date  . Hip pinning Right   . Av fistula placement Right 07/24/2013    Procedure: RIGHT arm exploration of antecubital space;  Surgeon: Michelle Dutch, MD;  Location: [facility] OR;  Service: Vascular;  Laterality: Right;   Family History  Problem Relation Age of Onset  . Cancer - Colon Mother   . Cancer Father   . Hypertension Father   . Diabetes    . Diabetes Sister    History   Social History  . Marital Status: Single    Spouse  Name: N/A    Number of Children: 4  . Years of Education: 11th   Occupational History  . Not on file.   Social History Main Topics  . Smoking status: Former Smoker  Types: Cigarettes    Quit date: 06/19/2010  . Smokeless tobacco: Never Used  . Alcohol Use: No  . Drug Use: No     Comment: past history of alcohol, cocaine and IV drug use  . Sexual Activity: Not Currently    Partners: Male     Comment: given condoms   Other Topics Concern  . Not on file   Social History Narrative   Patient lives at home with sister.    Patient is unemployed.    Patient is single.    Patient has 2 alive and 2 deceased.    Patient has 11th grade education.         Denies smoking, EtOH, illicits.  Review of Systems: Review of Systems  Constitutional: Negative for fever.  HENT: Negative for congestion.   Eyes: Negative for blurred vision.  Respiratory: Negative for cough and shortness of breath.   Cardiovascular: Negative for chest pain, palpitations, orthopnea and leg swelling.  Gastrointestinal: Negative for nausea, vomiting, abdominal pain, diarrhea, constipation and blood in stool.  Genitourinary: Negative for dysuria and hematuria.  Musculoskeletal: Negative for back pain and falls.  Skin: Negative for itching.  Neurological: Positive for headaches. Negative for dizziness, tingling, focal weakness, seizures, loss of consciousness and weakness.    Physical Exam: Blood pressure 173/89, pulse 72, temperature 99.3 F (37.4 C), temperature source Oral, resp. rate 26, height 5' 5"  (1.651 m), weight 158 lb 3 oz (71.753 kg), SpO2 100.00%. General: alert, cooperative, and in no apparent distress HEENT: NCAT, vision grossly intact, oropharynx clear and non-erythematous  Neck: supple, no lymphadenopathy Lungs: clear to ascultation bilaterally, normal work of respiration, no wheezes, rales, ronchi Heart: regular rate and rhythm, no murmurs, gallops, or rubs Abdomen: soft, non-tender,  non-distended, normal bowel sounds Extremities: 2+ DP/PT pulses bilaterally, no cyanosis, clubbing, or edema Neurologic: alert & oriented X3, cranial nerves II-XII intact, strength grossly intact, sensation intact to light touch   Lab results: Basic Metabolic Panel:  Recent Labs  09/08/13 0011  NA 139  K 5.3  CL 109  CO2 13*  GLUCOSE 96  BUN 24*  CREATININE 1.52*  CALCIUM 8.3*   Liver Function Tests:  Recent Labs  09/08/13 0011  AST 44*  ALT 26  ALKPHOS 244*  BILITOT 0.5  PROT 7.5  ALBUMIN 2.2*   CBC:  Recent Labs  09/08/13 0011  WBC 4.5  HGB 6.2*  HCT 18.1*  MCV 102.8*  PLT 194   CBG:  Recent Labs  09/07/13 2346  GLUCAP 97   Urine Drug Screen: Drugs of Abuse     Component Value Date/Time   LABOPIA NONE DETECTED 08/02/2013 1946   COCAINSCRNUR NONE DETECTED 08/02/2013 1946   LABBENZ NONE DETECTED 08/02/2013 1946   AMPHETMU NONE DETECTED 08/02/2013 1946   THCU NONE DETECTED 08/02/2013 1946   LABBARB NONE DETECTED 08/02/2013 1946   Urinalysis:  Recent Labs  09/07/13 2313  COLORURINE YELLOW  LABSPEC 1.015  PHURINE 6.0  GLUCOSEU NEGATIVE  HGBUR NEGATIVE  BILIRUBINUR NEGATIVE  KETONESUR NEGATIVE  PROTEINUR >300*  UROBILINOGEN 0.2  NITRITE NEGATIVE  LEUKOCYTESUR NEGATIVE    Imaging results:  Ct Head Wo Contrast  09/08/2013   CLINICAL DATA:  Headaches.  EXAM: CT HEAD WITHOUT CONTRAST  TECHNIQUE: Contiguous axial images were obtained from the base of the skull through the vertex without intravenous contrast.  COMPARISON:  07/09/2013  FINDINGS: Prominence of the sulci and ventricles compatible with brain atrophy. Mild low attenuation within the subcortical and periventricular  white matter is identified consistent with chronic small vessel ischemic disease. No acute cortical infarct, hemorrhage, or mass lesion ispresent. No significant extra-axial fluid collection is present. The paranasal sinuses andmastoid air cells are clear. The osseous skull is intact.   IMPRESSION: 1. No acute intracranial abnormalities. 2. Stable appearance of mild chronic small vessel ischemic disease and atrophy.   Electronically Signed   By: Kerby Moors M.D.   On: 09/08/2013 01:36    Other results: EKG: normal sinus rhythm, LVH, unchanged from prior  Assessment & Plan by Problem: #Anemia- Baseline Hgb 8-9, Hgb on admission 6.2 though patient asymptomatic.  Patient has history of autoimmune hemolytic anemia thought to be medication-induced due to ceftriaxone, diagnosed during 04/2013 admission.  She was started on prednisone 40 mg daily at that time with hematology follow-up with Dr. Juliann Mule.  Per Dr. Boyce Medici 06/27/13 note, patient was to continue prednisone 20 mg for 1 month then if remission persisted, dosing to be reduced to alternating 20 mg daily and 10 mg daily for one month, etc; tapering of glucocorticoids to be continued as long as hemoglobin and haptoglobin levels remained improved and stable, LDH levels low, and absolute reticulocyte count below 100,000/uL.  Furthermore, steroids to be stopped only once hemoglobin, haptoglobin, LDH, and absolute reticulocyte count normalized.  However, prednisone was discontinued during 2/19 admission as team thought patient had completed taper.   -admit to IMTS telemetry for observation -transfuse 1 unit pRBC -LDH, haptoglobin pending -restart prednisone 20 mg daily in AM  -cycle troponins -repeat EKG in AM  -monitor for signs/symptoms of bleeding  #Headache- Patient states that her headache is unchanged from chronic headaches, and she was resting comfortably prior to admission interview.   Neurologic exam unremarkable, CT head with no acute intracranial abnormalities though stable mild chronic small vessel ischemic disease and atrophy noted.  Patient requesting Percocet but will not prescribe opiates at this time; of note, caution using acetaminophen given patient's chronic hepatitis C c/b cirrhosis.  Given patient's baseline mental  status with intermittent confusion including lack of understanding of prn use of medication in addition to substance abuse history, would strongly advise against chronic narcotic use in this patient.  -focus on treating hypertension per below as this is likely contributing to headache -continue to monitor  #HTN- Elevated at 564P systolic on admission, 329/51 during admission exam, likely contributing to patient's headache.  History of poor control with multiple admission for hypertensive emergency.  Patient on labetalol, lisinopril, clonidine, hydralazine, Lasix at home.  Could consider initiating nondihydropyridine calcium channel blocker such as Diltiazem or verapamil as these drugs may have a synergistic vasodilatory effect with ACEi.  -continue home meds including dose of hydralazine PO on admission -continue to monitor  #Seizure disorder- Last seizure on 07/21/13 during that admission.  Appears that patient is currently on Keppra 500 mg BID (renal dosing), Dilantin 200 mg BID at home.  She admitted to medication non-compliance particularly with anti-epileptics to EDP.   -continue home meds -repeat Keppra and Dilantin levels in AM   #CKD3 with nephrotic range proteinuria- Baseline Cr 2-2.4 (?), Cr on admission 1.52 (K 5.3).  Patient has biopsy-proven FSGS, nephrology estimates need for HD in 6 months to 1 year; she is on lisinopril 40 mg daily at home.  Patient had appointment with vascular surgery on 4/9 for HD access planning with anticipated left arm AV graft.    #DM2- Last A1C 6.3% on 08/02/13 though inaccurate given hemolytic anemia with history of multiple transfusions.  On Lantus 30 units qhs, Novolog 10 units TID AC at home, well-controlled on this regimen per recent clinic notes.   -Lantus 30 units qhs, SSI-sensitive; increase regimen as necessary  -CBGs AC, qhs  #HIV- Stable, follows with Dr. Baxter Flattery.  Last CD4 490, viral load < 20 in 04/2013.  Appears that patient is currently on [place]  and Epizicom at home per ID recs from 05/31/13.  She is also on acyclovir for HSV ppx.  Did not show at 3/18 ID follow-up appointment.  -continue home meds -ID follow-up outpatient  #Pulmonary nodule- Noted on admission CXR in 05/2013 (1 cm nodular density along the anterior lower chest, has been present on prior films as far back as 2004).  CT chest on 06/19/13 showed 2.5 cm cavitary poorly defined nodular density in the left lower lobe.  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 infection, more likely residual scarring/benign nodule from the same lesion. Patient denies a history of TB, cough, purulent or bloody sputum though Quantiferon indeterminant.  She needs limited non-contrasted CT chest q3 months for one year (not done yet) with referral to pulmonology for FOB and transbronchial biopsy if nodule progresses in size. -CT chest either while inpatient or at outpatient follow-up   #dCHF- Patient reports good compliance with home meds, last exacerbation in 05/2013.  No evidence of volume overload on exam thus no concern for acute exacerbation at this time.  -continue home meds including BB, ACEi, ASA  #DVT PPX- SCDs given possible bleed  #Code status- Full code  Dispo: Disposition is deferred at this time, awaiting improvement of current medical problems. Anticipated discharge in approximately 1-2 day(s).   The patient does have a current PCP Michelle Gallant, MD) and does need an [facility] hospital follow-up appointment after discharge.   Signed: Ivin Poot, MD 09/08/2013, 3:12 AM

## 2013-09-08 NOTE — H&P (Addendum)
Internal Medicine Attending Admission Note Date: 09/08/2013  Patient name: MAKYLIE UHLES Medical record number: [id number] Date of birth: [birth year] Age: 53 y.o. Gender: female  I saw and evaluated the patient. I reviewed the resident's note and I agree with the resident's findings and plan as documented in the resident's note, with the following additional comments.  Chief Complaint(s): Intermittent chronic headaches  History - key components related to admission: Patient is a 53 year old woman with history of autoimmune hemolytic anemia, seizure disorder, hypertension, type 2 diabetes mellitus, diastolic congestive heart failure, HIV, chronic headaches, and other problems as outlined in the medical history, brought to the ED by EMS with complaint of headache.  EMS was reportedly called because of altered mental status.  She was found to be severely anemic in the emergency department, and decision was made to admit her for treatment of her anemia.    Physical Exam - key components related to admission:  Filed Vitals:   09/08/13 0353 09/08/13 0459 09/08/13 0629 09/08/13 0844  BP: 190/100 168/78 198/87 187/88  Pulse: 77 86 79 86  Temp:  98.3 F (36.8 C) 98.6 F (37 C)   TempSrc:  Oral Oral   Resp: 22 20 18    Height: 5\' 5"  (1.651 m)     Weight: 171 lb 15.3 oz (78 kg)     SpO2: 100%  100%     General: Alert, no distress Lungs: Clear Heart: Regular; normal S1-S2; no extra sounds or murmurs Abdomen: Bowel sounds present, soft, nontender Extremities: No edema   Lab results:   Basic Metabolic Panel:  Recent Labs  17/71/16 0011  NA 139  K 5.3  CL 109  CO2 13*  GLUCOSE 96  BUN 24*  CREATININE 1.52*  CALCIUM 8.3*    Liver Function Tests:  Recent Labs  09/08/13 0011  AST 44*  ALT 26  ALKPHOS 244*  BILITOT 0.5  PROT 7.5  ALBUMIN 2.2*     CBC:  Recent Labs  09/08/13 0011  WBC 4.5  HGB 6.2*  HCT 18.1*  MCV 102.8*  PLT 194     CBG:  Recent Labs   09/07/13 2346 09/08/13 0633  GLUCAP 97 78     Urine Drug Screen: Drugs of Abuse     Component Value Date/Time   LABOPIA NONE DETECTED 09/08/2013 0634   COCAINSCRNUR NONE DETECTED 09/08/2013 0634   LABBENZ NONE DETECTED 09/08/2013 0634   AMPHETMU NONE DETECTED 09/08/2013 0634   THCU NONE DETECTED 09/08/2013 0634   LABBARB NONE DETECTED 09/08/2013 0634      Urinalysis    Component Value Date/Time   COLORURINE YELLOW 09/07/2013 2313   APPEARANCEUR CLEAR 09/07/2013 2313   LABSPEC 1.015 09/07/2013 2313   PHURINE 6.0 09/07/2013 2313   GLUCOSEU NEGATIVE 09/07/2013 2313   HGBUR NEGATIVE 09/07/2013 2313   BILIRUBINUR NEGATIVE 09/07/2013 2313   KETONESUR NEGATIVE 09/07/2013 2313   PROTEINUR >300* 09/07/2013 2313   UROBILINOGEN 0.2 09/07/2013 2313   NITRITE NEGATIVE 09/07/2013 2313   LEUKOCYTESUR NEGATIVE 09/07/2013 2313    Urine microscopic:  Recent Labs  09/07/13 2313  EPIU RARE  WBCU 3-6  RBCU 0-2  BACTERIA RARE  LABCAST HYALINE CASTS*    HIV 1 RNA Quant  Date Value Ref Range Status  05/07/2013 <20  <20 copies/mL Final     HIV Genotype cannot be performed due to low viral load.     CD4 T Cell Abs  Date Value Ref Range Status  05/07/2013 490  400 -  2700 /uL Final     Imaging results:  Ct Head Wo Contrast  09/08/2013   CLINICAL DATA:  Headaches.  EXAM: CT HEAD WITHOUT CONTRAST  TECHNIQUE: Contiguous axial images were obtained from the base of the skull through the vertex without intravenous contrast.  COMPARISON:  07/09/2013  FINDINGS: Prominence of the sulci and ventricles compatible with brain atrophy. Mild low attenuation within the subcortical and periventricular white matter is identified consistent with chronic small vessel ischemic disease. No acute cortical infarct, hemorrhage, or mass lesion ispresent. No significant extra-axial fluid collection is present. The paranasal sinuses andmastoid air cells are clear. The osseous skull is intact.  IMPRESSION: 1. No acute intracranial  abnormalities. 2. Stable appearance of mild chronic small vessel ischemic disease and atrophy.   Electronically Signed   By: Signa Kellaylor  Stroud M.D.   On: 09/08/2013 01:36    Other results: EKG: Sinus rhythm; left ventricular hypertrophy; probable early repolarization; T wave inversion in lead 3  Assessment & Plan by Problem:  1.  Anemia.    Patient has a history of anemia diagnosed as autoimmune hemolytic anemia, and she responded to prednisone in the past.  She has apparently not been on prednisone recently.  Her LDH is mildly elevated.  Plan is check haptoglobin, technical smear, and anemia panel; transfuse; resume prednisone; consult hematology regarding overall management.  2.  Headache.  Patient reports stable chronic intermittent headaches, and she denies any change in that pattern.  She has no fever or meningeal signs.  Her last CD4 was 490 in December of last year, with undetectable viral load, which would weigh against opportunistic infection.  Plan is symptomatic treatment; check CD4 and viral load.  3.  Hypertension.  Patient is on a substantial home regimen; we need to verify and resume home regimen, adjust as indicated.  4.  HIV.  Patient is followed in the ID the clinic by Dr. Drue SecondSnider.  Her last CD4 was 490 in December of 2014, with undetectable viral load.  Plan is continue home medications; check CD4 and viral load.  5.  Elevated anion gap metabolic acidosis.  This was present on previous admission, was attributed to renal insufficiency, and resolved.  Her lactic acid is not elevated; would check a salicylate level, and ask pharmacy to review her medication for potential contributors; follow metabolic panel; oral bicarbonate replacement may be helpful.  6.  Other problems and plans as per the resident physician's note.  7.  Dr. Rogelia BogaButcher will return as attending physician tomorrow 09/09/2013.

## 2013-09-08 NOTE — Progress Notes (Signed)
Getting ready to start blood. PIV is not flushing and is out of vein. Attempted to restart PIV x2. Unable to restart. PT has one arm to start IV in, her right arm is restricted. Blood returned to blood bank. Blood will still need to be given in 4 hour time period of 0630 pick up time. IV team paged at 0700 and made aware of need for PIV and to give blood.

## 2013-09-08 NOTE — Progress Notes (Signed)
Pt refuses to answer anymore questions about home/health situation at this time for admission forms. States " she just wants some peace and quiet at this time." Will re-ask at a later time.

## 2013-09-08 NOTE — Progress Notes (Signed)
Internal Medicine intern paged to make aware that pt is on the floor. Made aware of b/p 190/100. Pt c/o headache. Intern stated they would be up to see pt. Will continue to assess. Awaiting lab to finish processing blood to transfuse as ordered.

## 2013-09-08 NOTE — ED Notes (Signed)
Pt reports that she recently had right arm surgery and arm has been numb for a couple of weeks.

## 2013-09-08 NOTE — Progress Notes (Signed)
Subjective:   Objective: Vital signs in last 24 hours: Filed Vitals:   09/08/13 0353 09/08/13 0459 09/08/13 0629 09/08/13 0844  BP: 190/100 168/78 198/87 187/88  Pulse: 77 86 79 86  Temp:  98.3 F (36.8 C) 98.6 F (37 C)   TempSrc:  Oral Oral   Resp: 22 20 18    Height: 5\' 5"  (1.651 m)     Weight: 171 lb 15.3 oz (78 kg)     SpO2: 100%  100%    Weight change:  No intake or output data in the 24 hours ending 09/08/13 0913  Physical Exam: General: Alert, cooperative, NAD. HEENT: PERRL, EOMI. Moist mucus membranes Neck: Full range of motion without pain, supple, no lymphadenopathy or carotid bruits Lungs: Clear to ascultation bilaterally, normal work of respiration, no wheezes, rales, rhonchi Heart: RRR, no murmurs, gallops, or rubs Abdomen: Soft, non-tender, non-distended, BS + Extremities: No cyanosis, clubbing, or edema Neurologic: Alert & oriented X3, cranial nerves II-XII intact, strength grossly intact, sensation intact to light touch  Lab Results: Basic Metabolic Panel:  Recent Labs Lab 09/08/13 0011  NA 139  K 5.3  CL 109  CO2 13*  GLUCOSE 96  BUN 24*  CREATININE 1.52*  CALCIUM 8.3*   Liver Function Tests:  Recent Labs Lab 09/08/13 0011  AST 44*  ALT 26  ALKPHOS 244*  BILITOT 0.5  PROT 7.5  ALBUMIN 2.2*   No results found for this basename: LIPASE, AMYLASE,  in the last 168 hours No results found for this basename: AMMONIA,  in the last 168 hours CBC:  Recent Labs Lab 09/08/13 0011  WBC 4.5  HGB 6.2*  HCT 18.1*  MCV 102.8*  PLT 194   CBG:  Recent Labs Lab 09/07/13 2346 09/08/13 0633  GLUCAP 97 78   Anemia Panel: No results found for this basename: VITAMINB12, FOLATE, FERRITIN, TIBC, IRON, RETICCTPCT,  in the last 168 hours  Urine Drug Screen: Drugs of Abuse     Component Value Date/Time   LABOPIA NONE DETECTED 09/08/2013 0634   COCAINSCRNUR NONE DETECTED 09/08/2013 0634   LABBENZ NONE DETECTED 09/08/2013 0634   AMPHETMU NONE  DETECTED 09/08/2013 0634   THCU NONE DETECTED 09/08/2013 0634   LABBARB NONE DETECTED 09/08/2013 0634    Urinalysis:  Recent Labs Lab 09/07/13 2313  COLORURINE YELLOW  LABSPEC 1.015  PHURINE 6.0  GLUCOSEU NEGATIVE  HGBUR NEGATIVE  BILIRUBINUR NEGATIVE  KETONESUR NEGATIVE  PROTEINUR >300*  UROBILINOGEN 0.2  NITRITE NEGATIVE  LEUKOCYTESUR NEGATIVE   Studies/Results: Ct Head Wo Contrast  09/08/2013   CLINICAL DATA:  Headaches.  EXAM: CT HEAD WITHOUT CONTRAST  TECHNIQUE: Contiguous axial images were obtained from the base of the skull through the vertex without intravenous contrast.  COMPARISON:  07/09/2013  FINDINGS: Prominence of the sulci and ventricles compatible with brain atrophy. Mild low attenuation within the subcortical and periventricular white matter is identified consistent with chronic small vessel ischemic disease. No acute cortical infarct, hemorrhage, or mass lesion ispresent. No significant extra-axial fluid collection is present. The paranasal sinuses andmastoid air cells are clear. The osseous skull is intact.  IMPRESSION: 1. No acute intracranial abnormalities. 2. Stable appearance of mild chronic small vessel ischemic disease and atrophy.   Electronically Signed   By: Signa Kell M.D.   On: 09/08/2013 01:36   Medications: I have reviewed the patient's current medications. Scheduled Meds: . abacavir  600 mg Oral Daily  . cholecalciferol  1,000 Units Oral Daily  . cloNIDine  0.2 mg Oral BID  . furosemide  40 mg Oral Daily  . hydrALAZINE  50 mg Oral TID  . insulin aspart  0-5 Units Subcutaneous QHS  . insulin aspart  0-9 Units Subcutaneous TID WC  . insulin glargine  30 Units Subcutaneous Daily  . lamiVUDine  150 mg Oral Daily  . levETIRAcetam  500 mg Oral BID  . lisinopril  40 mg Oral Daily  . lopinavir-ritonavir  2 tablet Oral BID  . LORazepam  1 mg Oral Daily  . pantoprazole  40 mg Oral Daily  . phenytoin  200 mg Oral BID  . predniSONE  20 mg Oral Q  breakfast  . sodium chloride  3 mL Intravenous Q12H  . sodium chloride  3 mL Intravenous Q12H  . tobramycin  1 drop Both Eyes 6 times per day   Continuous Infusions:  PRN Meds:.sodium chloride, hydrOXYzine, sodium chloride  Assessment/Plan: [name] is a 53 y.o. female w/ PMHx of Seizures, h/o CVA, HIV/AIDS, HTN, HLD, Gout, CKD w/ FSGS, CHF, Chronic Hep C, DM type II, AIHA, and chronic headaches, admitted for anemia.   Anemia- Patient previously diagnosed w/ AIHA, possibly related to Ceftriaxone use. Admitted w/ Hb 6.2, however patient is asymptomatic. Peripheral smear shows hypochromia, w/o red cell fragments or schistocytes. LDH of 263, decreased from 397 on 07/19/13. Heme/onc consulted, recommended holding transfusion given absence of symptoms, repeating iron panel, haptoglobin, retic count and continuing Prednisone 40 mg po qd.  -Hold transfusion PRBC's. -Haptoglobin, reticulocytes, iron studies, folate, and B12 pending -Increase Prednisone to 40 mg daily -Continue to cycle troponins; x1 negative. -Monitor for signs/symptoms of bleeding   Headache- Patient claims headaches have been ongoing for the past 2-3 years, mild in nature, improved since admission.  -Continue to monitor; if worsen, will consider working up further w/ LP given patient's HIV status.   HTN- Quite hypertensive on admission, now 170's/80's. -Continue Clonidine 0.2 mg bid, Lasix 40 mg qd, hydralazine 50 mg tid, and lisinopril 40 mg qd -Add Labetalol 100 mg bid for now; will increase to 200 mg bid if needed.  -Continue to monitor   Seizure disorder- Last seizure on 07/21/13 during that admission. Appears that patient is currently on Keppra 500 mg BID (renal dosing), Dilantin 200 mg BID at home. She admitted to medication non-compliance particularly with anti-epileptics to EDP.  -Continue Dilantin + Keppra -Keppra and Dilantin levels pending  CKD stage 3 w/ FSGS- Baseline Cr 2-2.4 (?), Cr on admission 1.52 (K  5.3), now Cr 1.43 w/ K 5.0. Patient has biopsy-proven FSGS, nephrology estimates need for HD in 6 months to 1 year; On lisinopril 40 mg qd at home. Patient had appointment with vascular surgery on 4/9 for HD access planning with anticipated left arm AV graft.  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 On Lantus 30 units qhs, Novolog 10 units TID AC at home, well-controlled on this regimen per recent clinic notes.  -Lantus 30 units qhs, SSI-sensitive -CBGs AC/HS  HIV- Stable, follows with Dr. Drue Second. Last CD4 490, VL < 20 in 04/2013. Clarified HIV regimen via Dr. Feliz Beam [facility] notes. Patient on Epzicom (abacavir + lamivudine) and Kaletra (Lopinavir + Ritonavir).  -Continue home medications -Repeat CD4 + VL pending  Pulmonary nodule- CT chest on 06/19/13 showed 2.5 cm cavitary poorly defined nodular density in the LLL. 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 infection, more likely residual scarring/benign nodule from the same lesion. Patient denies a history of Tb, cough, purulent or bloody sputum, previous Quantiferon indeterminant. CT repeated today, shows 2.7 cm nodule w/in LLL has minimally increased in size in the interval, previously 2.2 cm. Further evaluation with bronchoscopy and/or PET scan likely necessary. -Consult pulmonology in AM  dCHF- Stable. -Continue home meds; BB, ACEi, ASA   DVT PPX- SCDs  Code status- Full code  Dispo: Disposition is deferred at this time, awaiting improvement of current medical problems.  Anticipated discharge in approximately 1-2 day(s).   The patient does have a current PCP Christen Bame(Nora Sadek, MD) and [facility].  The patient does not have transportation limitations that hinder transportation to clinic  appointments.  .Services Needed at time of discharge: Y = Yes, Blank = No PT:   OT:   RN:   Equipment:   Other:     LOS: 1 day   Courtney ParisEden W Alanzo Lamb, MD 09/08/2013, 9:13 AM

## 2013-09-08 NOTE — Consult Note (Signed)
Reason for Referral: Anemia.   HPI: This is a 53 year old woman with history of seizure disorder, HTN, DM2, CHF, HIV, chronic HCV, as well as chronic headaches. She presented on 09/07/2013 with headache, found to be anemic (Hgb 6.2) thus admitted for transfusion. She was seen in the past by Dr. Rosie Fate in 05/2013 for a presumed AIHA. She was  started on prednisone for the autoimmune hemolytic anemia at that time, which was thought to have contributed to her fluid retention. She was reduced to 20 mg of prednisone (down from 40 mg of prednisone daily). 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 Her Hgb at that time improved to 8.8 after being down to the 6 range. At that time it was recommended that a slow taper of her prednisone as long as are her hemolytic parameters are stable. It was unclear whether and how she came off prednisone but when she presented this time around she was completely off prednisone. Upon my evaluation, she is asymptomatic at this time. She has not received any packed red cell transfusions. She has reported that her headaches have improved. She is not reporting any chest pain or difficulty breathing. She has not reported any nausea or vomiting or abdominal pain. Has not reported any hematochezia or melena. Is not reporting any seizures or neurological deficits. She has not reported any musculoskeletal complaints. Did not report any lymphadenopathy petechiae or rashes.      Past Medical History  Diagnosis Date  . Seizures   . Stroke   . Meningitis   . HIV (human immunodeficiency virus infection)   . Hypertension   . Gout   . Muscle spasms of head and/or neck   . CKD (chronic kidney disease)   . CHF (congestive heart failure)     [name] 06/18/2013  . HCV (hepatitis C virus)     chronic/notes 06/18/2013  . Type II diabetes mellitus     [name] 06/18/2013  . AIHA  (autoimmune hemolytic anemia)     [name] 06/18/2013  . Hypertensive encephalopathy ~ 05/2013    hospitalaized/notes 06/18/2013  . Daily headache     "for the last 6 years/notes 06/18/2013  . Exertional shortness of breath     [name] 06/18/2013  . Anxiety     [name] 06/18/2013  . Nephrotic syndrome   . History of syphilis   . High cholesterol   :  Past Surgical History  Procedure Laterality Date  . Hip pinning Right   . Av fistula placement Right 07/24/2013    Procedure: RIGHT arm exploration of antecubital space;  Surgeon: Sherren Kerns, MD;  Location: [facility] OR;  Service: Vascular;  Laterality: Right;  :   Current Facility-Administered Medications  Medication Dose Route Frequency Provider Last Rate Last Dose  . 0.9 %  sodium chloride infusion  250 mL Intravenous PRN Ky Barban, MD      . abacavir (ZIAGEN) tablet 600 mg  600 mg Oral Daily Ky Barban, MD   600 mg at 09/08/13 1114  . cholecalciferol (VITAMIN D) tablet 1,000 Units  1,000 Units Oral Daily Ky Barban, MD   1,000 Units at 09/08/13 1112  . cloNIDine (CATAPRES) tablet 0.2 mg  0.2 mg Oral BID Ky Barban, MD   0.2 mg at 09/08/13 0841  . furosemide (LASIX) tablet 40 mg  40 mg Oral Daily Ky Barban, MD  40 mg at 09/08/13 1114  . hydrALAZINE (APRESOLINE) tablet 50 mg  50 mg Oral TID Ky Barban, MD   50 mg at 09/08/13 1112  . HYDROcodone-acetaminophen (NORCO) 10-325 MG per tablet 1 tablet  1 tablet Oral Q6H PRN Courtney Paris, MD      . hydrOXYzine (ATARAX/VISTARIL) tablet 10 mg  10 mg Oral Q8H PRN Ky Barban, MD      . insulin aspart (novoLOG) injection 0-5 Units  0-5 Units Subcutaneous QHS Ky Barban, MD      . insulin aspart (novoLOG) injection 0-9 Units  0-9 Units Subcutaneous TID WC Ky Barban, MD   2 Units at 09/08/13 1123  . insulin glargine (LANTUS) injection 30 Units  30 Units Subcutaneous Daily Ky Barban, MD   30 Units at 09/08/13 1115   . lamiVUDine (EPIVIR) tablet 150 mg  150 mg Oral Daily Ky Barban, MD   150 mg at 09/08/13 1113  . levETIRAcetam (KEPPRA) tablet 500 mg  500 mg Oral BID Ky Barban, MD   500 mg at 09/08/13 1113  . lisinopril (PRINIVIL,ZESTRIL) tablet 40 mg  40 mg Oral Daily Ky Barban, MD   40 mg at 09/08/13 1114  . lopinavir-ritonavir (KALETRA) 200-50 MG per tablet 2 tablet  2 tablet Oral BID Ky Barban, MD   2 tablet at 09/08/13 1112  . LORazepam (ATIVAN) tablet 1 mg  1 mg Oral Daily Ky Barban, MD   1 mg at 09/08/13 1123  . pantoprazole (PROTONIX) EC tablet 40 mg  40 mg Oral Daily Ky Barban, MD   40 mg at 09/08/13 1111  . phenytoin (DILANTIN) chewable tablet 200 mg  200 mg Oral BID Ky Barban, MD   200 mg at 09/08/13 1115  . predniSONE (DELTASONE) tablet 20 mg  20 mg Oral Q breakfast Ky Barban, MD   20 mg at 09/08/13 0736  . sodium chloride 0.9 % injection 3 mL  3 mL Intravenous Q12H Ky Barban, MD      . sodium chloride 0.9 % injection 3 mL  3 mL Intravenous Q12H Ky Barban, MD      . sodium chloride 0.9 % injection 3 mL  3 mL Intravenous PRN Ky Barban, MD      . tobramycin (TOBREX) 0.3 % ophthalmic solution 1 drop  1 drop Both Eyes 6 times per day Ky Barban, MD   1 drop at 09/08/13 1117      Allergies  Allergen Reactions  . 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 Norvasc [Amlodipine Besylate]     Itching, rash , hives .   [name] Morphine And Related Hives, Itching and Rash  :  Family History  Problem Relation Age of Onset  . Cancer - Colon Mother   . Cancer Father   . Hypertension Father   . Diabetes    . Diabetes Sister   :  History   Social History  . Marital Status: Single    Spouse Name: N/A    Number of Children: 4  . Years of Education: 11th   Occupational History  . Not on file.   Social History Main Topics  . Smoking  status: Former Smoker    Types: Cigarettes    Quit date: 06/19/2010  . Smokeless tobacco: Never Used  . Alcohol Use: No  . Drug Use: No  Comment: past history of alcohol, cocaine and IV drug use  . Sexual Activity: Not Currently    Partners: Male     Comment: given condoms   Other Topics Concern  . Not on file   Social History Narrative   Patient lives at home with sister.    Patient is unemployed.    Patient is single.    Patient has 2 alive and 2 deceased.    Patient has 11th grade education.         :  Pertinent items are noted in HPI.  Exam: Blood pressure 187/88, pulse 86, temperature 98.6 F (37 C), temperature source Oral, resp. rate 18, height 5\' 5"  (1.651 m), weight 171 lb 15.3 oz (78 kg), SpO2 100.00%. General appearance: alert and cooperative Head: Normocephalic, without obvious abnormality, atraumatic Throat: lips, mucosa, and tongue normal; teeth and gums normal Neck: no adenopathy, no carotid bruit, no JVD, supple, symmetrical, trachea midline and thyroid not enlarged, symmetric, no tenderness/mass/nodules Back: symmetric, no curvature. ROM normal. No CVA tenderness. Resp: clear to auscultation bilaterally Chest wall: no tenderness Cardio: regular rate and rhythm, S1, S2 normal, no murmur, click, rub or gallop GI: soft, non-tender; bowel sounds normal; no masses,  no organomegaly Extremities: extremities normal, atraumatic, no cyanosis or edema Pulses: 2+ and symmetric Skin: Skin color, texture, turgor normal. No rashes or lesions Lymph nodes: Cervical, supraclavicular, and axillary nodes normal.   Recent Labs  09/08/13 0011  WBC 4.5  HGB 6.2*  HCT 18.1*  PLT 194    Recent Labs  09/08/13 0011 09/08/13 0915  NA 139 139  K 5.3 5.0  CL 109 109  CO2 13* 13*  GLUCOSE 96 202*  BUN 24* 20  CREATININE 1.52* 1.43*  CALCIUM 8.3* 8.4     Blood smear review: I personally reviewed her peripheral smear. It did show evidence of spherocytosis and  polychromasia. I did notice some hypochromia as well There is no evidence of any schistocytes or red cell fragments. Platelets appeared enlarged but not clumped. White cells appeared normal.     Assessment and Plan:    53 year old woman with multiple comorbid conditions including hepatitis and HIV and likely multifactorial anemia. She clearly has an element of hemolysis as evidence by continuously positive complement DAT. Her IgG DAT have been continuously negative. She has very limited reticulocyte count which could be related to other issues are causing her bone marrow suppression. These would include chronic illnesses as well as HIV medication potentially. She does have an element of iron deficiency as well as evidence by her hypochromia on her peripheral smear.  My recommendation at this point: I will hold off on any packed red cell transfusions as he is not very symptomatic at this point. I would increase her prednisone to 40 mg daily and I will check her hemolysis labs. I would repeat her LDH, haptoglobin, reticulocyte count. I would also repeat her iron studies to make sure she is also fully replete at this time.  She also has an element of anemia of chronic renal insufficiency and she might benefit from growth factor support as well in the future.  If you have any questions next week regarding her care please contact Dr. Rosie Fatehism who is familiar with her case.

## 2013-09-09 ENCOUNTER — Other Ambulatory Visit: Payer: Self-pay | Admitting: Medical Oncology

## 2013-09-09 DIAGNOSIS — D591 Autoimmune hemolytic anemia, unspecified: Secondary | ICD-10-CM

## 2013-09-09 LAB — T-HELPER CELLS (CD4) COUNT (NOT AT ARMC)
CD4 % Helper T Cell: 29 % — ABNORMAL LOW (ref 33–55)
CD4 T Cell Abs: 280 /uL — ABNORMAL LOW (ref 400–2700)

## 2013-09-09 LAB — CBC
HCT: 22.4 % — ABNORMAL LOW (ref 36.0–46.0)
Hemoglobin: 7.4 g/dL — ABNORMAL LOW (ref 12.0–15.0)
MCH: 34.4 pg — ABNORMAL HIGH (ref 26.0–34.0)
MCHC: 33 g/dL (ref 30.0–36.0)
MCV: 104.2 fL — ABNORMAL HIGH (ref 78.0–100.0)
Platelets: 234 10*3/uL (ref 150–400)
RBC: 2.15 MIL/uL — ABNORMAL LOW (ref 3.87–5.11)
RDW: 14.8 % (ref 11.5–15.5)
WBC: 5.4 10*3/uL (ref 4.0–10.5)

## 2013-09-09 LAB — QUANTIFERON TB GOLD ASSAY (BLOOD)
Interferon Gamma Release Assay: NEGATIVE
MITOGEN VALUE: 1.98 [IU]/mL
Quantiferon Nil Value: 0.02 IU/mL
TB Ag value: 0.02 IU/mL
TB Antigen Minus Nil Value: 0 IU/mL

## 2013-09-09 LAB — GLUCOSE, CAPILLARY
Glucose-Capillary: 113 mg/dL — ABNORMAL HIGH (ref 70–99)
Glucose-Capillary: 285 mg/dL — ABNORMAL HIGH (ref 70–99)

## 2013-09-09 LAB — BASIC METABOLIC PANEL
BUN: 28 mg/dL — ABNORMAL HIGH (ref 6–23)
CO2: 13 mEq/L — ABNORMAL LOW (ref 19–32)
CREATININE: 1.78 mg/dL — AB (ref 0.50–1.10)
Calcium: 8.2 mg/dL — ABNORMAL LOW (ref 8.4–10.5)
Chloride: 106 mEq/L (ref 96–112)
GFR, EST AFRICAN AMERICAN: 36 mL/min — AB (ref >90)
GFR, EST NON AFRICAN AMERICAN: 31 mL/min — AB (ref >90)
Glucose, Bld: 115 mg/dL — ABNORMAL HIGH (ref 70–99)
Potassium: 4.6 mEq/L (ref 3.7–5.3)
Sodium: 137 mEq/L (ref 137–147)

## 2013-09-09 LAB — RETICULOCYTES
RBC.: 2.15 MIL/uL — AB (ref 3.87–5.11)
RETIC COUNT ABSOLUTE: 47.3 10*3/uL (ref 19.0–186.0)
Retic Ct Pct: 2.2 % (ref 0.4–3.1)

## 2013-09-09 LAB — IRON AND TIBC
Iron: 139 ug/dL — ABNORMAL HIGH (ref 42–135)
Saturation Ratios: 72 % — ABNORMAL HIGH (ref 20–55)
TIBC: 194 ug/dL — ABNORMAL LOW (ref 250–470)
UIBC: 55 ug/dL — ABNORMAL LOW (ref 125–400)

## 2013-09-09 LAB — VITAMIN B12: Vitamin B-12: 739 pg/mL (ref 211–911)

## 2013-09-09 LAB — FOLATE: FOLATE: 8.2 ng/mL

## 2013-09-09 LAB — HIV-1 RNA QUANT-NO REFLEX-BLD: HIV-1 RNA Quant, Log: <1.3 {Log} (ref <1.30)

## 2013-09-09 LAB — FERRITIN: Ferritin: 955 ng/mL — ABNORMAL HIGH (ref 10–291)

## 2013-09-09 MED ORDER — SODIUM BICARBONATE 650 MG PO TABS
1300.0000 mg | ORAL_TABLET | Freq: Two times a day (BID) | ORAL | Status: DC
  Administered 2013-09-09: 1300 mg via ORAL
  Filled 2013-09-09 (×2): qty 2

## 2013-09-09 MED ORDER — PREDNISONE 20 MG PO TABS: 40.0000 mg | ORAL_TABLET | Freq: Every day | ORAL | Status: DC

## 2013-09-09 MED ORDER — LEVETIRACETAM 500 MG PO TABS: 500.0000 mg | ORAL_TABLET | Freq: Two times a day (BID) | ORAL | Status: DC

## 2013-09-09 MED ORDER — LABETALOL HCL 100 MG PO TABS: 100.0000 mg | ORAL_TABLET | Freq: Two times a day (BID) | ORAL | Status: DC

## 2013-09-09 MED ORDER — SODIUM BICARBONATE 650 MG PO TABS: 1300.0000 mg | ORAL_TABLET | Freq: Two times a day (BID) | ORAL | Status: DC

## 2013-09-09 MED ORDER — PHENYTOIN 50 MG PO CHEW: 200.0000 mg | CHEWABLE_TABLET | Freq: Two times a day (BID) | ORAL | Status: DC

## 2013-09-09 MED ORDER — CYCLOBENZAPRINE HCL 5 MG PO TABS
5.0000 mg | ORAL_TABLET | Freq: Once | ORAL | Status: AC
  Administered 2013-09-09: 5 mg via ORAL
  Filled 2013-09-09: qty 1

## 2013-09-09 MED ORDER — OXYCODONE-ACETAMINOPHEN 10-325 MG PO TABS: 1.0000 | ORAL_TABLET | ORAL | Status: DC | PRN

## 2013-09-09 MED ORDER — CYCLOBENZAPRINE HCL 5 MG PO TABS: 5.0000 mg | ORAL_TABLET | Freq: Every day | ORAL | Status: DC | PRN

## 2013-09-09 NOTE — Progress Notes (Signed)
  Date: 09/09/2013  Patient name: Michelle [name]  Medical record number: [id number]  Date of birth: 21-Sep-1960   This patient has been seen and the plan of care was discussed with the house staff. Please see their note for complete details. I concur with their findings with the following additions/corrections: Ms Ottum was sitting side of bed and c/o back pain and spasms in her hands and back, unrelieved by the hydrocodone. She used to get Flexeril and we will give her one dose. Otherwise, her HgB is stable and she is back on prednisone. The biggest issue for her is her low healthcare literacy, leading to medication confusion. Pt states she has home health once weekly and uses [facility] and a commercial pharmacy. Will see if aide can meet her at home to review med changes and do pill box.   Burns Spain, MD 09/09/2013, 1:33 PM

## 2013-09-09 NOTE — Care Management Note (Signed)
    Page 1 of 1   09/09/2013     4:07:33 PM   CARE MANAGEMENT NOTE 09/09/2013  Patient:  Michelle [name], Michelle [name]   Account Number:  [id number]  Date Initiated:  09/09/2013  Documentation initiated by:  Kendrell Lottman  Subjective/Objective Assessment:   PT ADM ON 4/11 WITH HTN, HA, ANEMIA.  PTA, PT LIVES ALONE, AND IS INDEPENDENT.     Action/Plan:   PT WILL NEED HHRN AT DC; REFERRAL TO [facility], PER PT CHOICE. START OF CARE IN AM, PER MD REQUEST.   Anticipated DC Date:  09/09/2013   Anticipated DC Plan:  HOME W HOME HEALTH SERVICES      DC Planning Services  CM consult      [facility] Choice  HOME HEALTH   Choice offered to / List presented to:  C-1 Patient        HH arranged  HH-1 RN      [facility] agency  [facility]   Status of service:  Completed, signed off Medicare Important Message given?   (If response is "NO", the following Medicare IM given date fields will be blank) Date Medicare IM given:   Date Additional Medicare IM given:    Discharge Disposition:  HOME W HOME HEALTH SERVICES  Per UR Regulation:  Reviewed for med. necessity/level of care/duration of stay  If discussed at Long Length of Stay Meetings, dates discussed:    Comments:

## 2013-09-09 NOTE — Progress Notes (Signed)
Discharge instructions and prescription given to patient; all questions answered.  Patient's ride at bedside.  Patient escorted via wheelchair to family's car by [place], NT.

## 2013-09-09 NOTE — Progress Notes (Signed)
Patient complaining of 10/10 sharp back pain unrelieved by norco; also states she is having muscle spasms in her hands.  Dr. Yetta Barre notified; new order received.  Will continue to monitor.

## 2013-09-09 NOTE — Discharge Summary (Signed)
Name: Michelle [name] MRN: [id number] DOB: 06/06/1960 53 y.o. PCP: Clinton Gallant, MD  Date of Admission: 09/07/2013 10:41 PM Date of Discharge: 09/09/2013 Attending Physician: Bartholomew Crews, MD  Discharge Diagnosis: 1. Anemia 2. CKD Stage III 3. LLL Pulmonary Nodule 4. HIV 5. Seizure Disorder  Discharge Medications:   Medication List    STOP taking these medications       Darunavir Ethanolate 800 MG tablet  Commonly known as:  PREZISTA     NORVIR 100 MG Tabs tablet  Generic drug:  ritonavir      TAKE these medications       abacavir 300 MG tablet  Commonly known as:  ZIAGEN  Take 2 tablets (600 mg total) by mouth daily.     ACCU-CHEK AVIVA PLUS W/DEVICE Kit  USE AS DIRECTED     aspirin EC 81 MG tablet  Take 1 tablet (81 mg total) by mouth daily.     cholecalciferol 1000 UNITS tablet  Commonly known as:  VITAMIN D  Take 1,000 Units by mouth daily.     cloNIDine 0.2 MG tablet  Commonly known as:  CATAPRES  Take 1 tablet (0.2 mg total) by mouth 2 (two) times daily.     cyclobenzaprine 5 MG tablet  Commonly known as:  FLEXERIL  Take 1 tablet (5 mg total) by mouth daily as needed for muscle spasms.     furosemide 40 MG tablet  Commonly known as:  LASIX  Take 1 tablet (40 mg total) by mouth daily.     gentamicin 0.3 % ophthalmic ointment  Commonly known as:  GARAMYCIN  Place 1 application into both eyes 4 (four) times daily.     hydrALAZINE 50 MG tablet  Commonly known as:  APRESOLINE  Take 50 mg by mouth 3 (three) times daily.     hydrOXYzine 10 MG tablet  Commonly known as:  ATARAX/VISTARIL  Take 1 tablet (10 mg total) by mouth every 8 (eight) hours as needed for itching.     insulin aspart 100 UNIT/ML injection  Commonly known as:  novoLOG  Inject 10 Units into the skin 3 (three) times daily before meals. Home Sliding scale     insulin glargine 100 UNIT/ML injection  Commonly known as:  LANTUS  Inject 30 Units into the skin at bedtime.     labetalol 100 MG tablet  Commonly known as:  NORMODYNE  Take 1 tablet (100 mg total) by mouth 2 (two) times daily.     lamiVUDine 150 MG tablet  Commonly known as:  EPIVIR  Take 1 tablet (150 mg total) by mouth daily.     levETIRAcetam 500 MG tablet  Commonly known as:  KEPPRA  Take 1 tablet (500 mg total) by mouth 2 (two) times daily.     lisinopril 40 MG tablet  Commonly known as:  PRINIVIL,ZESTRIL  Take 1 tablet (40 mg total) by mouth daily.     lopinavir-ritonavir 200-50 MG per tablet  Commonly known as:  KALETRA  Take 2 tablets by mouth 2 (two) times daily.     LORazepam 1 MG tablet  Commonly known as:  ATIVAN  Take 1 mg by mouth daily. Take one tablet for seizure prevention.     oxyCODONE-acetaminophen 10-325 MG per tablet  Commonly known as:  PERCOCET  Take 1 tablet by mouth every 4 (four) hours as needed for pain.     pantoprazole 40 MG tablet  Commonly known as:  PROTONIX  Take 1 tablet (  40 mg total) by mouth daily.     phenytoin 50 MG tablet  Commonly known as:  DILANTIN INFATABS  Chew 4 tablets (200 mg total) by mouth 2 (two) times daily.     predniSONE 20 MG tablet  Commonly known as:  DELTASONE  Take 2 tablets (40 mg total) by mouth daily with breakfast.     sodium bicarbonate 650 MG tablet  Take 2 tablets (1,300 mg total) by mouth 2 (two) times daily.        Disposition and follow-up:   [name] was discharged from [facility] in Stable condition.  At the hospital follow up visit please address:  1. Anemia; Patient w/ h/o AIHA, needs Prednisone dosage addressed. Needs review of iron panel w/ Dr. Juliann Mule. Has patient had any SOB, dizziness, lightheadedness, or fatigue?  Pulmonary Nodule; Patient to follow up w/ Pulmonology on 09/16/13. Will likey need bronchoscopy to evaluate nodule, which has increased slightly in size.   HIV; Patient on Kaletra + Epzicom, reports compliance, however, patient seems to be confused about her  medications in general. Additionally, patient w/ new metabolic acidosis on admission, started on NaHCO3. Is it possible that HIV medications have contributed to new onset acidosis? [facility] on 09/26/13.  CKD; Patient w/ FSGS (via biopsy), will likely need HD in 6 months to 1 year. Patient w/ new onset acidosis, questionably 2/2 HIV medications? Started on NaHCO3 1300 mg bid. For follow up w/ Dr. Joelyn Oms 09/27/13.   Seizures; PLEASE REVIEW ANTI-SEIZURE MEDICATIONS; sent home on Keppra 500 mg bid (found to be therapeutic during admission) + Dilantin 200 mg bid (undetectable levels on admission). Is patient clear about the medications she is taking?   2.  Labs / imaging needed at time of follow-up: CBC, BMP, magnesium, Dilantin + Keppra levels.   3.  Pending labs/ test needing follow-up: Iron panel, reticulocytes  Follow-up Appointments: Follow-up Information   Follow up with CHISM, DAVID, MD. (Office will call you with an appointment)    Specialty:  Internal Medicine   Contact information:   [place] [address] [phone]       Follow up with [facility] [place] On 09/16/2013. (12:00 PM)    Contact information:   [place] [phone]       Follow up with Rexene Agent, MD On 09/27/2013. (10:00 AM)    Specialty:  Nephrology   Contact information:   [place] [phone]       Follow up with Carlyle Basques, MD On 09/26/2013. (11:15 AM)    Specialty:  Infectious Diseases   Contact information:   [place] [address] [phone]       Follow up with Clinton Gallant, MD On 09/20/2013. (3:15 PM)    Specialty:  Internal Medicine   Contact information:   [place]  [phone]       Discharge Instructions: Discharge Orders   Future Appointments Provider Department Dept Phone   09/12/2013 11:00 AM Jan Fireman, [place] [phone]   09/16/2013 2:00 PM Melvenia Needles, NP [facility] [phone]   09/20/2013 3:15 PM Clinton Gallant, MD [place] [phone]   09/26/2013 11:15 AM Carlyle Basques, MD [facility] [phone]   11/08/2013 1:15 PM Clinton Gallant, MD [facility] [place] [phone]   Future  Orders Complete By Expires   Call MD for:  difficulty breathing, headache or visual disturbances  As directed    Call MD for:  extreme fatigue  As directed    Call MD for:  persistant dizziness or light-headedness  As directed    Increase activity slowly  As directed       Consultations: Treatment Team:  Concha Norway, MD  Procedures Performed:  Dg Hip Complete Right  08/26/2013   CLINICAL DATA:  Five-day history of right hip pain  EXAM: RIGHT HIP - COMPLETE 2+ VIEW  COMPARISON:  None.  FINDINGS: A single orthopedic screw is present within the right acetabulum. 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 No evidence of acute fracture or malalignment. No conventional radiographic changes to suggest avascular necrosis. Atherosclerotic calcifications noted in the pelvic vessels. Bony mineralization is within normal limits. No lytic or blastic osseous lesion.  IMPRESSION: 1. 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 2. Single orthopedic screw present within the right acetabulum.   Electronically Signed   By: Jacqulynn Cadet M.D.   On: 08/26/2013 17:48   Ct Head Wo Contrast  09/08/2013   CLINICAL DATA:  Headaches.  EXAM: CT HEAD WITHOUT CONTRAST  TECHNIQUE: Contiguous axial images were obtained from the base of the skull through the vertex without intravenous contrast.  COMPARISON:  07/09/2013  FINDINGS: Prominence of the sulci and ventricles compatible with brain atrophy. Mild low attenuation within the  subcortical and periventricular white matter is identified consistent with chronic small vessel ischemic disease. No acute cortical infarct, hemorrhage, or mass lesion ispresent. No significant extra-axial fluid collection is present. The paranasal sinuses andmastoid air cells are clear. The osseous skull is intact.  IMPRESSION: 1. No acute intracranial abnormalities. 2. Stable appearance of mild chronic small vessel ischemic disease and atrophy.   Electronically Signed   By: Kerby Moors M.D.   On: 09/08/2013 01:36   Ct Chest Wo Contrast  09/08/2013   CLINICAL DATA:  Evaluate left lower lobe nodule, history of HIV, hepatitis-C, chronic kidney disease, diabetes  EXAM: CT CHEST WITHOUT CONTRAST  TECHNIQUE: Multidetector CT imaging of the chest was performed following the standard protocol without IV contrast.  COMPARISON:  DG CHEST 1V PORT dated 07/19/2013; DG CHEST 2 VIEW dated 06/18/2013; CT CHEST W/O CM dated 06/19/2013; CT HEAD W/O CM dated 09/08/2013  FINDINGS: The cavitary approximately 2.7 x 1.7 cm nodule within the left lower lobe (image 31, series 3 is unchanged to minimally increased in size in the interval, previously, 2.2 x 1.8 cm. There is a minimal amount of adjacent subsegmental atelectasis within the left lower lobe, unchanged. There is minimal subsegmental atelectasis within in the right lower lobe. Very minimal dependent subpleural ground-glass atelectasis with the medial basilar segment of the right lower lobe. No new focal airspace opacities. No pleural effusion or pneumothorax. The central pulmonary airways are widely patent.  Scattered shotty mediastinal lymph nodes are individually not enlarged by size criteria. No definitive mediastinal, hilar or axillary lymphadenopathy on this noncontrast examination.  Normal heart size. Coronary artery calcifications. No pericardial effusion. There is mild decreased attenuation of the intra cardiac blood pool suggestive of anemia. Borderline enlarged  caliber of the main pulmonary artery measuring approximately 3.2 cm (image 28, series 2). Minimal atherosclerotic plaque within the posterior aspect of the aortic arch. No definite intramural hematoma. Conventional configuration of the aortic arch.  Limited visualization of the upper abdomen demonstrates mild diffuse thickening of the crux of the left adrenal gland but is otherwise unremarkable.  No acute or aggressive osseous abnormalities. There is pulsation artifact degrading evaluation of the manubrium and superior aspect of the sternum.  Regional soft tissues appear normal. Normal noncontrast appearance of the thyroid gland.  IMPRESSION: 1. The cavitary approximately 2.7 cm nodule within the left lower lobe has minimally increased in size in the interval, previously, 2.2 cm - differential considerations again include inflammatory/infectious etiologies (favored in this patient with HIV), though malignancy is not excluded. Further evaluation with bronchoscopy and/or PET scan could be performed as clinically indicated. 2. No acute cardiopulmonary disease. 3. Coronary artery calcifications.   Electronically Signed   By: Sandi Mariscal M.D.   On: 09/08/2013 11:20   Admission HPI:  Ms. Brossman is a 53 year old woman with history of autoimmune hemolytic anemia, seizure disorder, HTN, DM2, dCHF (grade 2), HIV, chronic HCV, chronic headaches who presents with headache, found to be anemic (Hgb 6.2) thus admitted for transfusion.  Patient is an extremely poor historian. She states she woke up from a nap with a headache on 4/11. She only had one Percocet left so since her head was hurting, and she did not want to take her last pill, her cousin called EMS. Patient states EMS told her that her blood pressure was too high so she needed to go to ED, and this is why she came to the hospital. Of note, patient has history of daily headaches x 6 years, and yesterday's headache was no different than her typical headaches (right side  of her head and "in the sinuses"), no associated symptoms. She was resting comfortably prior to admission interview but then repeatedly requesting Percocet "10s".  Patient reported poor medication compliance in ED but reported good medication compliance with use of pill box during admission interview. However, she does not know the names of any of her medications, unable to state what any medication is used for. She is in the process of moving in with her cousin.  Denies chest pain, palpitations, shortness of breath, cough, nausea, leg swelling, difficulty speaking, focal weakness, numbness/tingling, LOC; no change in bowel or bladder habits (no blood in stool).   Hospital Course by problem list:   1. Anemia- Baseline Hb 8-9, admitted w/ Hb of 6.2, asymptomatic. LDH shown to be 263 (decreased from 280 in 06/2013). 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 She was started on prednisone 40 mg daily at that time with hematology follow-up with Dr. Juliann Mule. Per Dr. Boyce Medici 06/27/13 note, patient was to continue prednisone 20 mg for 1 month then if remission persisted, dosing to be reduced based on LDH and haptoglobin levels, however, prednisone was discontinued during 2/19 admission as team thought patient had completed taper. Consulted Heme/Onc, suggested restarting Prednisone 40 mg po qd, checking iron panel and reticulocytes. Also suggested holding off on transfusion. Repeat CBC on 09/09/13 showed Hb of 7.4, w/out giving PRBC's. Patient continued to be asymptomatic, to follow up w/ Dr. Juliann Mule on discharge.   2. CKD Stage III- Baseline Cr 2-2.4 (?), Cr on admission 1.52 (K 5.3). Patient has biopsy-proven FSGS, nephrology estimates need for HD in 6 months to 1 year; she is on lisinopril 40 mg daily at home. Patient had appointment with vascular surgery on 4/9 for HD access planning with anticipated left arm AV graft. Also  admitted  w/ AG metabolic acidosis, new since this admission, unknown etiology. Questionably caused by HIV meds, however, after discussion w/ ID, would cause a lactic acidosis, not present in this case. D/w Dr. Joelyn Oms, agreed w/ starting patient on NaHCO3 1300 mg po bid w/ close renal follow up.   3. LLL Pulmonary Nodule- CT chest on 06/19/13 showed 2.5 cm cavitary poorly defined nodular density in the LLL. 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 infection, more likely residual scarring/benign nodule from the same lesion. Patient denies a history of Tb, cough, purulent or bloody sputum, previous Quantiferon indeterminant. CT repeated 09/09/13, showed 2.7 cm nodule w/in LLL has minimally increased in size in the interval, previously 2.2 cm. Further evaluation with bronchoscopy and/or PET scan likely necessary. Follow up appointment with pulmonology scheduled for 09/16/13 on discharge for further workup of nodule.  4. HIV- Stable, follows with Dr. Baxter Flattery. Last CD4 490, VL < 20 in 04/2013. Clarified HIV regimen via Dr. Storm Frisk [facility] notes. Patient on Epzicom (abacavir + lamivudine) and Kaletra (Lopinavir + Ritonavir). See above w/ regards to acidosis.   5. Seizure Disorder- Last seizure on 07/21/13 during that admission. Appears that patient is currently on Keppra 500 mg BID (renal dosing), Dilantin 200 mg BID at home. She admitted to medication non-compliance particularly with anti-epileptics. After further discussion, patient has likely not been taking Dilantin. Checked Dilantin level, found to be undetectable, Keppra level therapeutic. Discharged patient on Keppra 500 mg bid +Dilantin 200 mg bid. Will need further adjustments on an outpatient basis.   Discharge Vitals:   BP 169/88  Pulse 84  Temp(Src) 97.7 F (36.5 C) (Oral)  Resp 18  Ht 5' 5"  (1.651 m)  Wt 171 lb 15.3 oz (78 kg)  BMI  28.62 kg/m2  SpO2 100%  Discharge Labs:  Results for orders placed during the hospital encounter of 09/07/13 (from the past 24 hour(s))  GLUCOSE, CAPILLARY     Status: Abnormal   Collection Time    09/08/13  4:24 PM      Result Value Ref Range   Glucose-Capillary 189 (*) 70 - 99 mg/dL   Comment 1 Notify RN     Comment 2 Documented in Chart    TROPONIN I     Status: None   Collection Time    09/08/13  5:05 PM      Result Value Ref Range   Troponin I <0.30  <0.30 ng/mL  GLUCOSE, CAPILLARY     Status: Abnormal   Collection Time    09/08/13  9:03 PM      Result Value Ref Range   Glucose-Capillary 137 (*) 70 - 99 mg/dL  BASIC METABOLIC PANEL     Status: Abnormal   Collection Time    09/09/13  6:00 AM      Result Value Ref Range   Sodium 137  137 - 147 mEq/L   Potassium 4.6  3.7 - 5.3 mEq/L   Chloride 106  96 - 112 mEq/L   CO2 13 (*) 19 - 32 mEq/L   Glucose, Bld 115 (*) 70 - 99 mg/dL   BUN 28 (*) 6 - 23 mg/dL   Creatinine, Ser 1.78 (*) 0.50 - 1.10 mg/dL   Calcium 8.2 (*) 8.4 - 10.5 mg/dL   GFR calc non Af Amer 31 (*) >90 mL/min   GFR calc Af Amer 36 (*) >90 mL/min  CBC  Status: Abnormal   Collection Time    09/09/13  6:00 AM      Result Value Ref Range   WBC 5.4  4.0 - 10.5 K/uL   RBC 2.15 (*) 3.87 - 5.11 MIL/uL   Hemoglobin 7.4 (*) 12.0 - 15.0 g/dL   HCT 22.4 (*) 36.0 - 46.0 %   MCV 104.2 (*) 78.0 - 100.0 fL   MCH 34.4 (*) 26.0 - 34.0 pg   MCHC 33.0  30.0 - 36.0 g/dL   RDW 14.8  11.5 - 15.5 %   Platelets 234  150 - 400 K/uL  RETICULOCYTES     Status: Abnormal   Collection Time    09/09/13  6:00 AM      Result Value Ref Range   Retic Ct Pct 2.2  0.4 - 3.1 %   RBC. 2.15 (*) 3.87 - 5.11 MIL/uL   Retic Count, Manual 47.3  19.0 - 186.0 K/uL  IRON AND TIBC     Status: Abnormal   Collection Time    09/09/13  6:00 AM      Result Value Ref Range   Iron 139 (*) 42 - 135 ug/dL   TIBC 194 (*) 250 - 470 ug/dL   Saturation Ratios 72 (*) 20 - 55 %   UIBC 55 (*) 125 - 400  ug/dL  FERRITIN     Status: Abnormal   Collection Time    09/09/13  6:00 AM      Result Value Ref Range   Ferritin 955 (*) 10 - 291 ng/mL  FOLATE     Status: None   Collection Time    09/09/13  6:00 AM      Result Value Ref Range   Folate 8.2    VITAMIN B12     Status: None   Collection Time    09/09/13  6:00 AM      Result Value Ref Range   Vitamin B-12 739  211 - 911 pg/mL  GLUCOSE, CAPILLARY     Status: Abnormal   Collection Time    09/09/13  6:05 AM      Result Value Ref Range   Glucose-Capillary 113 (*) 70 - 99 mg/dL  GLUCOSE, CAPILLARY     Status: Abnormal   Collection Time    09/09/13 11:09 AM      Result Value Ref Range   Glucose-Capillary 285 (*) 70 - 99 mg/dL   Comment 1 Notify RN     Comment 2 Documented in Chart      Signed: Corky Sox, MD 09/09/2013, 1:05 PM   Time Spent on Discharge: 45 minutes Services Ordered on Discharge: none Equipment Ordered on Discharge: none

## 2013-09-09 NOTE — Progress Notes (Addendum)
Subjective: Patient seen at bedside this AM, says she is feeling well. Having some back spasms later in the morning. Says she is still having headaches, but this is a chronic issue for her. Otherwise denies symptoms suggestive of anemia. Hb 7.4 this AM.    Objective: Vital signs in last 24 hours: Filed Vitals:   09/08/13 1334 09/08/13 1941 09/09/13 0308 09/09/13 0939  BP: 166/82 167/84 163/93 169/88  Pulse: 105 93 84 84  Temp: 99 F (37.2 C) 97.9 F (36.6 C) 97.7 F (36.5 C)   TempSrc: Oral Oral Oral   Resp: 16 18 18    Height:      Weight:      SpO2: 100% 99% 100%    Weight change:   Intake/Output Summary (Last 24 hours) at 09/09/13 1122 Last data filed at 09/09/13 0830  Gross per 24 hour  Intake    720 ml  Output      0 ml  Net    720 ml    Physical Exam: General: Alert, cooperative, in mild distress b/c of back spasms.  HEENT: PERRL, EOMI. Moist mucus membranes Neck: Full range of motion without pain, supple, no lymphadenopathy or carotid bruits Lungs: Clear to ascultation bilaterally, normal work of respiration, no wheezes, rales, rhonchi Heart: RRR, no murmurs, gallops, or rubs Abdomen: Soft, non-tender, non-distended, BS + Extremities: No cyanosis, clubbing, or edema Neurologic: Alert & oriented X3, cranial nerves II-XII intact, strength grossly intact, sensation intact to light touch  Lab Results: Basic Metabolic Panel:  Recent Labs Lab 09/08/13 0915 09/09/13 0600  NA 139 137  K 5.0 4.6  CL 109 106  CO2 13* 13*  GLUCOSE 202* 115*  BUN 20 28*  CREATININE 1.43* 1.78*  CALCIUM 8.4 8.2*   Liver Function Tests:  Recent Labs Lab 09/08/13 0011  AST 44*  ALT 26  ALKPHOS 244*  BILITOT 0.5  PROT 7.5  ALBUMIN 2.2*   CBC:  Recent Labs Lab 09/08/13 0011 09/09/13 0600  WBC 4.5 5.4  HGB 6.2* 7.4*  HCT 18.1* 22.4*  MCV 102.8* 104.2*  PLT 194 234   CBG:  Recent Labs Lab 09/08/13 0633 09/08/13 1115 09/08/13 1624 09/08/13 2103  09/09/13 0605 09/09/13 1109  GLUCAP 78 175* 189* 137* 113* 285*   Anemia Panel:  Recent Labs Lab 09/09/13 0600  RETICCTPCT 2.2    Urine Drug Screen: Drugs of Abuse     Component Value Date/Time   LABOPIA NONE DETECTED 09/08/2013 0634   COCAINSCRNUR NONE DETECTED 09/08/2013 0634   LABBENZ NONE DETECTED 09/08/2013 0634   AMPHETMU NONE DETECTED 09/08/2013 0634   THCU NONE DETECTED 09/08/2013 0634   LABBARB NONE DETECTED 09/08/2013 0634    Urinalysis:  Recent Labs Lab 09/07/13 2313  COLORURINE YELLOW  LABSPEC 1.015  PHURINE 6.0  GLUCOSEU NEGATIVE  HGBUR NEGATIVE  BILIRUBINUR NEGATIVE  KETONESUR NEGATIVE  PROTEINUR >300*  UROBILINOGEN 0.2  NITRITE NEGATIVE  LEUKOCYTESUR NEGATIVE   Studies/Results: Ct Head Wo Contrast  09/08/2013   CLINICAL DATA:  Headaches.  EXAM: CT HEAD WITHOUT CONTRAST  TECHNIQUE: Contiguous axial images were obtained from the base of the skull through the vertex without intravenous contrast.  COMPARISON:  07/09/2013  FINDINGS: Prominence of the sulci and ventricles compatible with brain atrophy. Mild low attenuation within the subcortical and periventricular white matter is identified consistent with chronic small vessel ischemic disease. No acute cortical infarct, hemorrhage, or mass lesion ispresent. No significant extra-axial fluid collection is present. The paranasal sinuses andmastoid air  cells are clear. The osseous skull is intact.  IMPRESSION: 1. No acute intracranial abnormalities. 2. Stable appearance of mild chronic small vessel ischemic disease and atrophy.   Electronically Signed   By: Signa Kellaylor  Stroud M.D.   On: 09/08/2013 01:36   Ct Chest Wo Contrast  09/08/2013   CLINICAL DATA:  Evaluate left lower lobe nodule, history of HIV, hepatitis-C, chronic kidney disease, diabetes  EXAM: CT CHEST WITHOUT CONTRAST  TECHNIQUE: Multidetector CT imaging of the chest was performed following the standard protocol without IV contrast.  COMPARISON:  DG CHEST 1V  PORT dated 07/19/2013; DG CHEST 2 VIEW dated 06/18/2013; CT CHEST W/O CM dated 06/19/2013; CT HEAD W/O CM dated 09/08/2013  FINDINGS: The cavitary approximately 2.7 x 1.7 cm nodule within the left lower lobe (image 31, series 3 is unchanged to minimally increased in size in the interval, previously, 2.2 x 1.8 cm. There is a minimal amount of adjacent subsegmental atelectasis within the left lower lobe, unchanged. There is minimal subsegmental atelectasis within in the right lower lobe. Very minimal dependent subpleural ground-glass atelectasis with the medial basilar segment of the right lower lobe. No new focal airspace opacities. No pleural effusion or pneumothorax. The central pulmonary airways are widely patent.  Scattered shotty mediastinal lymph nodes are individually not enlarged by size criteria. No definitive mediastinal, hilar or axillary lymphadenopathy on this noncontrast examination.  Normal heart size. Coronary artery calcifications. No pericardial effusion. There is mild decreased attenuation of the intra cardiac blood pool suggestive of anemia. Borderline enlarged caliber of the main pulmonary artery measuring approximately 3.2 cm (image 28, series 2). Minimal atherosclerotic plaque within the posterior aspect of the aortic arch. No definite intramural hematoma. Conventional configuration of the aortic arch.  Limited visualization of the upper abdomen demonstrates mild diffuse thickening of the crux of the left adrenal gland but is otherwise unremarkable.  No acute or aggressive osseous abnormalities. There is pulsation artifact degrading evaluation of the manubrium and superior aspect of the sternum.  Regional soft tissues appear normal. Normal noncontrast appearance of the thyroid gland.  IMPRESSION: 1. The cavitary approximately 2.7 cm nodule within the left lower lobe has minimally increased in size in the interval, previously, 2.2 cm - differential considerations again include  inflammatory/infectious etiologies (favored in this patient with HIV), though malignancy is not excluded. Further evaluation with bronchoscopy and/or PET scan could be performed as clinically indicated. 2. No acute cardiopulmonary disease. 3. Coronary artery calcifications.   Electronically Signed   By: Simonne ComeJohn  Watts M.D.   On: 09/08/2013 11:20   Medications: I have reviewed the patient's current medications. Scheduled Meds: . abacavir  600 mg Oral Daily  . cholecalciferol  1,000 Units Oral Daily  . cloNIDine  0.2 mg Oral BID  . furosemide  40 mg Oral Daily  . hydrALAZINE  50 mg Oral TID  . insulin aspart  0-5 Units Subcutaneous QHS  . insulin aspart  0-9 Units Subcutaneous TID WC  . insulin glargine  30 Units Subcutaneous Daily  . labetalol  100 mg Oral BID  . lamiVUDine  150 mg Oral Daily  . levETIRAcetam  500 mg Oral BID  . lisinopril  40 mg Oral Daily  . lopinavir-ritonavir  2 tablet Oral BID  . LORazepam  1 mg Oral Daily  . pantoprazole  40 mg Oral Daily  . phenytoin  200 mg Oral BID  . predniSONE  40 mg Oral Q breakfast  . sodium bicarbonate  1,300 mg Oral BID  .  sodium chloride  3 mL Intravenous Q12H  . sodium chloride  3 mL Intravenous Q12H  . tobramycin  1 drop Both Eyes 6 times per day   Continuous Infusions:  PRN Meds:.HYDROcodone-acetaminophen, hydrOXYzine  Assessment/Plan: [name] is a 53 y.o. female w/ PMHx of Seizures, h/o CVA, HIV/AIDS, HTN, HLD, Gout, CKD w/ FSGS, CHF, Chronic Hep C, DM type II, AIHA, and chronic headaches, admitted for anemia.   Anemia- Patient previously diagnosed w/ AIHA, possibly related to Ceftriaxone use. Admitted w/ Hb 6.2, now 7.4, no PRBC's given. LDH trending down from previous values, haptoglobin wnl. Peripheral smear w/out red cell fragments or schistocytes. Reticulocyte count 47.3, w/ retic percent 2.2 (both wnl). Seen by heme/onc yesterday, agreed w/ Prednisone 40 mg po qd for now. Will have patient follow up w/ Dr. Rosie Fate in 1-2  weeks.  -Iron studies, folate, and B12 pending -Continue Prednisone to 40 mg daily -Dr. Rosie Fate follow up in 1-2 weeks.   Headache- Still with mild headaches. -No change.  HTN- Quite hypertensive on admission, still 170's/80's. -Continue Clonidine 0.2 mg bid, Lasix 40 mg qd, hydralazine 50 mg tid, and lisinopril 40 mg qd + Labetalol 100 mg bid -Continue to monitor  -Patient will follow up w/ PCP on 09/20/13 for further adjustments.   Seizure disorder- Last seizure on 07/21/13 during that admission. Now on Keppra 500 mg BID (renal dosing) and Dilantin 200 mg bid at home, however, patient dilantin level <2.5, therefore likely not taking at home.  -Continue Dilantin + Keppra at doses above on discharge. -Keppra level pending  CKD stage 3 w/ FSGS- Baseline Cr 2-2.4 (?), Cr on admission 1.52 (K 5.3), now Cr 1.78 w/ K 4.6. Patient has biopsy-proven FSGS, nephrology estimates need for HD in 6 months to 1 year; On lisinopril 40 mg qd at home. Patient w/ new acidosis seen on BMP, most recent HCO3 of 13. Discussed w/ Dr. Orvan Falconer about possible HIV regimen contribution, he feels that patient can follow up in [facility] clinic and changes can be made at that time. Additionally, he says that if meds are contributing to acidosis, it is typically a lactic acidosis, when patient's lactic acid was 1.9 on 09/08/13. -Discussed w/ Dr. Marisue Humble, will start NaHCO3 1300 mg bid at home.  -Patient will follow up with Dr. Marisue Humble on 09/27/13.  DM type II- Last A1C 6.3% on 08/02/13. On Lantus 30 units qhs, Novolog 10 units TID AC at home, well-controlled on this regimen per recent clinic notes.  -Lantus 30 units qhs, SSI-sensitive -CBGs AC/HS  HIV- Stable, follows with Dr. Drue Second. Last CD4 490, VL < 20 in 04/2013. Clarified HIV regimen via Dr. Feliz Beam [facility] notes. Patient on Epzicom (abacavir + lamivudine) and Kaletra (Lopinavir + Ritonavir). Some question if meds were contributing to acidosis, see above in "CKD" section.   -Continue home medications -Repeat CD4 + VL pending -Follow up in [facility] on 09/26/13.  Pulmonary nodule- CT chest on 06/19/13 showed 2.5 cm cavitary poorly defined nodular density in the LLL. 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 infection, more likely residual scarring/benign nodule from the same lesion. Patient denies a history of Tb, cough, purulent or bloody sputum, previous Quantiferon indeterminant. CT repeated today, shows 2.7 cm nodule w/in LLL has minimally increased in size in the interval, previously 2.2 cm. Further evaluation with bronchoscopy and/or PET scan likely necessary. -Follow up appointment with pulmonology  scheduled for 09/16/13 on discharge for further workup of nodule.  dCHF- Stable. -Continue home meds; BB, ACEi, ASA   DVT PPX- SCDs  Code status- Full code  Dispo: Anticipated discharge today.   The patient does have a current PCP Christen Bame, MD) and does need an [facility] hospital follow-up appointment after discharge.  The patient does not have transportation limitations that hinder transportation to clinic appointments.  .Services Needed at time of discharge: Y = Yes, Blank = No PT:   OT:   RN:   Equipment:   Other:     LOS: 2 days   Courtney Paris, MD 09/09/2013, 11:22 AM

## 2013-09-09 NOTE — Discharge Instructions (Signed)
1. You have the following appointments:  [facility], [facility]) Office will call you with an appointment  [address] [place] [address] [phone]  [facility]  On 09/16/2013 12:00 PM  [address] [place] [address] [phone]  Arita Miss  On 09/27/2013 10:00 AM  [address] [place] [address] [phone]  Judyann Munson  On 09/26/2013 11:15 AM  [address] [place] [address] [phone]  Christen Bame  On 09/20/2013 3:15 PM  [address] [place] [address] [phone]  2. Please take all medications as prescribed. PLEASE SEE ABOVE MEDICATION LIST FOR SPECIFIC DETAILS.  3. If you have worsening of your symptoms or new symptoms arise, please call the clinic ([phone]), or go to the ER immediately if symptoms are severe.

## 2013-09-10 LAB — TYPE AND SCREEN
ABO/RH(D): O POS
ANTIBODY SCREEN: POSITIVE
DAT, IGG: NEGATIVE
DAT, complement: POSITIVE
UNIT DIVISION: 0
Unit division: 0

## 2013-09-11 ENCOUNTER — Telehealth: Payer: Self-pay | Admitting: Internal Medicine

## 2013-09-11 NOTE — Telephone Encounter (Signed)
S/w the pt and her cousin and both were unsure of the April appts gve the dates and time of the lab appt for tomorrow. Pt her self was very confused regarding what i was talking about and she said to call her cousin tanya at [phone] per her cousin she does not bring her in for her appts and whoever does should see about the appt for tomorrow.

## 2013-09-12 ENCOUNTER — Ambulatory Visit (HOSPITAL_COMMUNITY): Payer: Medicaid Other | Admitting: Psychology

## 2013-09-12 ENCOUNTER — Other Ambulatory Visit: Payer: Medicaid Other

## 2013-09-12 LAB — LEVETIRACETAM LEVEL: Levetiracetam Lvl: 47.3 ug/mL

## 2013-09-12 LAB — ERYTHROPOIETIN: Erythropoietin: 18.2 m[IU]/mL (ref 2.6–18.5)

## 2013-09-14 LAB — CULTURE, BLOOD (ROUTINE X 2)
CULTURE: NO GROWTH
Culture: NO GROWTH

## 2013-09-16 ENCOUNTER — Inpatient Hospital Stay: Payer: Medicaid Other | Admitting: Adult Health

## 2013-09-16 ENCOUNTER — Other Ambulatory Visit (HOSPITAL_COMMUNITY): Payer: Self-pay | Admitting: Internal Medicine

## 2013-09-18 ENCOUNTER — Other Ambulatory Visit (HOSPITAL_COMMUNITY): Payer: Self-pay | Admitting: Internal Medicine

## 2013-09-20 ENCOUNTER — Ambulatory Visit (INDEPENDENT_AMBULATORY_CARE_PROVIDER_SITE_OTHER): Payer: Medicaid Other | Admitting: Internal Medicine

## 2013-09-20 ENCOUNTER — Telehealth: Payer: Self-pay | Admitting: Internal Medicine

## 2013-09-20 ENCOUNTER — Other Ambulatory Visit: Payer: Medicaid Other

## 2013-09-20 ENCOUNTER — Ambulatory Visit: Payer: Medicaid Other

## 2013-09-20 ENCOUNTER — Encounter: Payer: Self-pay | Admitting: Internal Medicine

## 2013-09-20 ENCOUNTER — Telehealth: Payer: Self-pay

## 2013-09-20 VITALS — BP 202/82 | HR 67 | Temp 98.3°F | Ht 65.0 in | Wt 196.0 lb

## 2013-09-20 DIAGNOSIS — I10 Essential (primary) hypertension: Secondary | ICD-10-CM

## 2013-09-20 DIAGNOSIS — I129 Hypertensive chronic kidney disease with stage 1 through stage 4 chronic kidney disease, or unspecified chronic kidney disease: Secondary | ICD-10-CM

## 2013-09-20 DIAGNOSIS — B20 Human immunodeficiency virus [HIV] disease: Secondary | ICD-10-CM

## 2013-09-20 DIAGNOSIS — E119 Type 2 diabetes mellitus without complications: Secondary | ICD-10-CM

## 2013-09-20 DIAGNOSIS — N183 Chronic kidney disease, stage 3 unspecified: Secondary | ICD-10-CM

## 2013-09-20 DIAGNOSIS — D591 Autoimmune hemolytic anemia, unspecified: Secondary | ICD-10-CM

## 2013-09-20 MED ORDER — OXYCODONE-ACETAMINOPHEN 10-325 MG PO TABS: 1.0000 | ORAL_TABLET | ORAL | Status: DC | PRN

## 2013-09-20 MED ORDER — LABETALOL HCL 200 MG PO TABS: 200.0000 mg | ORAL_TABLET | Freq: Two times a day (BID) | ORAL | Status: DC

## 2013-09-20 MED ORDER — HYDRALAZINE HCL 50 MG PO TABS: 50.0000 mg | ORAL_TABLET | Freq: Four times a day (QID) | ORAL | Status: DC

## 2013-09-20 NOTE — Patient Instructions (Signed)
We have checked all your appointments and it is very important that you go to them.   For your blood pressure we will make these changes:   labetelol 200mg  twice a day  Increase your hydralazine to 50mg  four times a day instead of 3 times a day  See you back in 1 week for close follow up

## 2013-09-20 NOTE — Telephone Encounter (Signed)
S/w pt that dr Rosie Fate is out of the office on an emergency and we will be rescheduling her appt. Pt said "OK"

## 2013-09-20 NOTE — Progress Notes (Signed)
Subjective:    Patient ID: Michelle [name], female    DOB: [birth year], 53 y.o.   MRN: [id number]  HPI Michelle [name] is a 53 yo woman pmh as listed below here for hospital follow up.   Pt was recently admitted for anemia Hgb 6.5 and received 1 u PRBCs. She has not noticed any bleeding, dark stools, palpitations, or fatigue. She in fact feels very well and seems to be ambulating and performing ADLs better than ever before per the patient.   She states compliance with all her medications and brings in her medication bottles and all is reviewed with with patient. There is still a very strong visible confusion and inability to fully comprehend the instructions of her medications and the consequences of not taking her medications. She is accompanied by her niece whom visits her and states she is not sure about compliance as the patient arranges her pills on her own. The niece has also noticed that the patient hoards and sneaks salt (having boxes of 100+ salt packets that patient uses with every meal- this was a serious situation before and the boxes were physically removed from the home but appears pt has returned to this habit).  She brings in her CBG meter that shows excellent control Max of 300 and lowest of 80 and averaged of 110-120. This is wonderful given previous poor compliance with insulin and higher prednisone doses for AIHA treatment. She has not had any hypoglycemic episodes.   In terms of her mood there have not been any return of the satanic voices and pt doesn't appear angry or depressed recently to her niece.   Review of Systems  Constitutional: Negative for fever, chills, activity change, fatigue and unexpected weight change.  Respiratory: Negative for shortness of breath.   Cardiovascular: Negative for chest pain, palpitations and leg swelling.  Gastrointestinal: Positive for abdominal distention. Negative for nausea, vomiting, diarrhea, blood in stool and anal bleeding.  Endocrine:  Negative for polydipsia and polyuria.  Genitourinary: Negative for hematuria and vaginal bleeding.  Skin: Negative for rash.  Neurological: Negative for dizziness, seizures, syncope and weakness.  Psychiatric/Behavioral: Negative for suicidal ideas, confusion, sleep disturbance, self-injury, decreased concentration and agitation. The patient is not nervous/anxious.     Past Medical History  Diagnosis Date  . Seizures   . Stroke   . Meningitis   . HIV (human immunodeficiency virus infection)   . Hypertension   . Gout   . Muscle spasms of head and/or neck   . CKD (chronic kidney disease)   . CHF (congestive heart failure)     Michelle [name] 06/18/2013  . HCV (hepatitis C virus)     chronic/notes 06/18/2013  . Type II diabetes mellitus     Michelle [name] 06/18/2013  . AIHA (autoimmune hemolytic anemia)     Michelle [name] 06/18/2013  . Hypertensive encephalopathy ~ 05/2013    hospitalaized/notes 06/18/2013  . Daily headache     "for the last 6 years/notes 06/18/2013  . Exertional shortness of breath     Michelle [name] 06/18/2013  . Anxiety     Michelle [name] 06/18/2013  . Nephrotic syndrome   . History of syphilis   . High cholesterol    Social, surgical, family history reviewed with patient and updated in appropriate chart locations.     Objective:   Physical Exam Filed Vitals:   09/20/13 1501  BP: 202/82  Pulse: 67  Temp: 98.3 F (36.8 C)   General: sitting in chair, ambulated w/o wheelchair HEENT: PERRL,  EOMI, no scleral icterus, slight facial swelling no periorbital edema or tongue swelling, no LAD Cardiac: RRR, no rubs, murmurs or gallops Pulm: clear to auscultation bilaterally, moving normal volumes of air Abd: soft, nontender, nondistended, BS present Ext: warm and well perfused, no pedal edema Neuro: alert and oriented X3, cranial nerves II-XII grossly intact    Assessment & Plan:  Please see problem oriented charting  Pt discussed with Dr. Heide SparkNarendra

## 2013-09-20 NOTE — Telephone Encounter (Signed)
s.w. pt and advised on April appt due to MD outof the office...pt ok adn aware

## 2013-09-22 NOTE — Assessment & Plan Note (Signed)
Pt has continued on 20mg  prednisone since her hospital discharge. She has not been able to follow up with Heme yet at this time but an appt was made and confirmed during the visit. She has not had any active sites of bleeding.

## 2013-09-22 NOTE — Assessment & Plan Note (Signed)
Pt is not having significant hyperglycemia and continues to have good control on current medication. Will continue and follow up as prednisone is tapered.

## 2013-09-22 NOTE — Assessment & Plan Note (Signed)
Pt has nephrotic syndrome and had vein mapping completed with out any plausible veins found during surgery for fistula placement. The patient therefore was deemed not a candidate for HD at this time . She has had some slight metabolic acidosis and is currently on NaHCO3. She had a follow up appt confirmed during this visit.

## 2013-09-22 NOTE — Assessment & Plan Note (Signed)
This has been a very difficult problem to manage with the patient. Pt has had several HTN emergency admissions and is able to achieve better BP control when direct medical supervision is provided and it is felt that at this time the safest option for the patient would be SNF. This is continually presented to the patient and family with very frank resistance given family members acting as health aides to the patient as well as patient anger at family members "interference into patient's life." in the setting of this complicated social and safety situation all possible assistance has been provided to the patient with minimal success as seen by this visit's BP.  BP Readings from Last 3 Encounters:  09/20/13 202/82  09/09/13 169/88  09/05/13 169/83   Pt is on 5 drug regimen with little success as per the situation described above. Therefore adding medications also poses more risk of complicating an already overwhelming management plan for the patient and caregivers.  -increase and continue labetelol 200mg  BID and hydralazine 50 QID from TID -cont lisinopril 40mg , lasix 40 mg, and clonidine 0.2 mg bid -bring back in 1 wk for re-eval -may need to add amlodipine and check labs at next visit in setting of diuretics and CKD

## 2013-09-22 NOTE — Assessment & Plan Note (Signed)
Pt has had some medication changes during her hospitalization in the setting of worsening kidney function and question of compliance/resistance. Pt had a confirmed appt with [facility] made during this appt.

## 2013-09-23 NOTE — Progress Notes (Signed)
INTERNAL MEDICINE TEACHING ATTENDING ADDENDUM - Elainah Rhyne, MD: I reviewed and discussed at the time of visit with the resident Dr. Sadek, the patient's medical history, physical examination, diagnosis and results of tests and treatment and I agree with the patient's care as documented. 

## 2013-09-25 ENCOUNTER — Ambulatory Visit (HOSPITAL_BASED_OUTPATIENT_CLINIC_OR_DEPARTMENT_OTHER): Payer: Medicaid Other | Admitting: Internal Medicine

## 2013-09-25 ENCOUNTER — Ambulatory Visit: Payer: Medicaid Other

## 2013-09-25 ENCOUNTER — Other Ambulatory Visit (HOSPITAL_BASED_OUTPATIENT_CLINIC_OR_DEPARTMENT_OTHER): Payer: Medicaid Other

## 2013-09-25 VITALS — BP 191/87 | HR 67 | Temp 98.2°F | Resp 18 | Ht 65.0 in | Wt 204.4 lb

## 2013-09-25 DIAGNOSIS — D591 Autoimmune hemolytic anemia, unspecified: Secondary | ICD-10-CM

## 2013-09-25 DIAGNOSIS — N189 Chronic kidney disease, unspecified: Secondary | ICD-10-CM

## 2013-09-25 LAB — CBC & DIFF AND RETIC
BASO%: 0 % (ref 0.0–2.0)
BASOS ABS: 0 10*3/uL (ref 0.0–0.1)
EOS ABS: 0 10*3/uL (ref 0.0–0.5)
EOS%: 0 % (ref 0.0–7.0)
HCT: 23.9 % — ABNORMAL LOW (ref 34.8–46.6)
HEMOGLOBIN: 7.9 g/dL — AB (ref 11.6–15.9)
IMMATURE RETIC FRACT: 3.8 % (ref 1.60–10.00)
LYMPH#: 0.6 10*3/uL — AB (ref 0.9–3.3)
LYMPH%: 10.1 % — ABNORMAL LOW (ref 14.0–49.7)
MCH: 35.7 pg — ABNORMAL HIGH (ref 25.1–34.0)
MCHC: 33.1 g/dL (ref 31.5–36.0)
MCV: 108.1 fL — AB (ref 79.5–101.0)
MONO#: 0.3 10*3/uL (ref 0.1–0.9)
MONO%: 5.5 % (ref 0.0–14.0)
NEUT%: 84.4 % — ABNORMAL HIGH (ref 38.4–76.8)
NEUTROS ABS: 5 10*3/uL (ref 1.5–6.5)
Platelets: 164 10*3/uL (ref 145–400)
RBC: 2.21 10*6/uL — AB (ref 3.70–5.45)
RDW: 14.8 % — AB (ref 11.2–14.5)
RETIC %: 3.39 % — AB (ref 0.70–2.10)
Retic Ct Abs: 74.92 10*3/uL (ref 33.70–90.70)
WBC: 6 10*3/uL (ref 3.9–10.3)

## 2013-09-25 LAB — HAPTOGLOBIN: HAPTOGLOBIN: 71 mg/dL (ref 45–215)

## 2013-09-25 LAB — TECHNOLOGIST REVIEW

## 2013-09-25 LAB — LACTATE DEHYDROGENASE (CC13): LDH: 314 U/L — AB (ref 125–245)

## 2013-09-25 NOTE — Progress Notes (Signed)
[facility] OFFICE PROGRESS NOTE  Michelle Bame, MD [address] [place] [address]  DIAGNOSIS: Autoimmune hemolytic anemia - Plan: CBC with Differential, CBC with Differential, Comprehensive metabolic panel (Cmet) - [facility], Lactate dehydrogenase, Lactate dehydrogenase, Haptoglobin, Haptoglobin  Chief Complaint  Patient presents with  . Follow-up    CURRENT TREATMENT:  Prednisone 40 mg by mouth daily since discharge on 09/09/2013  INTERVAL HISTORY: Michelle [name] 53 y.o. female with a history of autoimmune hemolytic anemia is here for follow-up.    Of note, she has a past medical history of CHF (EF normal, with Grade 2 diastolic dysfunction), HIV (CD4 490 and VL <20 on 05/07/13) chronic HCV, seizure disorder, history of CVA, HTN, T2DM, CKD and Gout. She was recently hospitalized on 01/20 and discharged on 1/22 for a CHF exacerbation.  She was recently admitted for anemia Hgb 6.5 and received 1 u PRBCs. She had her prednisone increased from 20 mg daily to 40 mg daily.  She last saw her primary care doctor, Dr. Christen [name] on 4/24. She has not noticed any bleeding, dark stools, palpitations, or fatigue. She in fact feels very well and seems to be ambulating and performing ADLs better than ever before per the patient. She has an appointment with a kidney specialist this week on Friday.   MEDICAL HISTORY: Past Medical History  Diagnosis Date  . Seizures   . Stroke   . Meningitis   . HIV (human immunodeficiency virus infection)   . Hypertension   . Gout   . Muscle spasms of head and/or neck   . CKD (chronic kidney disease)   . CHF (congestive heart failure)     Michelle [name] 06/18/2013  . HCV (hepatitis C virus)     chronic/notes 06/18/2013  . Type II diabetes mellitus     Michelle [name] 06/18/2013  . AIHA (autoimmune hemolytic anemia)     Michelle [name] 06/18/2013  . Hypertensive encephalopathy ~ 05/2013    hospitalaized/notes 06/18/2013  . Daily headache     "for the last 6 years/notes 06/18/2013   . Exertional shortness of breath     Michelle [name] 06/18/2013  . Anxiety     Michelle [name] 06/18/2013  . Nephrotic syndrome   . History of syphilis   . High cholesterol     INTERIM HISTORY: has Seizure; Unspecified essential hypertension; HIV disease; Gout; CKD (chronic kidney disease) stage 3, GFR 30-59 ml/min; CVA (cerebral vascular accident); Diabetes; Migraines; Autoimmune hemolytic anemia; Pulmonary hypertension; Unspecified protein-calorie malnutrition; HCV (hepatitis C virus); Lung nodule; Nephrotic syndrome; Suicidal ideation; Pruritus; End stage renal disease; and Anemia on her problem list.    ALLERGIES:  is allergic to ceftriaxone; norvasc; and morphine and related.  MEDICATIONS: has a current medication list which includes the following prescription(s): abacavir, aspirin ec, accu-chek aviva plus, cholecalciferol, clonidine, cyclobenzaprine, epzicom, furosemide, gentamicin, hydralazine, hydroxyzine, insulin aspart, insulin glargine, labetalol, lamivudine, levetiracetam, lisinopril, lopinavir-ritonavir, lorazepam, oxycodone-acetaminophen, pantoprazole, phenytoin, prednisone, and sodium bicarbonate.  SURGICAL HISTORY:  Past Surgical History  Procedure Laterality Date  . Hip pinning Right   . Av fistula placement Right 07/24/2013    Procedure: RIGHT arm exploration of antecubital space;  Surgeon: Sherren Kerns, MD;  Location[facility] OR;  Service: Vascular;  Laterality: Right;    REVIEW OF SYSTEMS:   Constitutional: Denies fevers, chills and she reports gaining 20 lbs due to fluids Eyes: Denies blurriness of vision Ears, nose, mouth, throat, and face: Denies mucositis or sore throat Respiratory: Denies cough, dyspnea or wheezes Cardiovascular:  Denies palpitation, chest discomfort or lower extremity swelling Gastrointestinal:  Denies nausea, heartburn or change in bowel habits Skin: Denies abnormal skin rashes Lymphatics: Denies new lymphadenopathy or easy bruising Neurological:Denies numbness,  tingling or new weaknesses Behavioral/Psych: Mood is stable, no new changes  All other systems were reviewed with the patient and are negative.  PHYSICAL EXAMINATION: ECOG PERFORMANCE STATUS: 1 - Symptomatic but completely ambulatory  Blood pressure 191/87, pulse 67, temperature 98.2 F (36.8 C), temperature source Oral, resp. rate 18, height 5\' 5"  (1.651 m), weight 204 lb 6.4 oz (92.715 kg), SpO2 100.00%.  GENERAL:alert, no distress and comfortable; chronically ill appearing, easily ambulatory SKIN: skin color, texture, turgor are normal, no rashes or significant lesions; Puffiness in face  EYES: normal, Conjunctiva are pink and non-injected, sclera clear  OROPHARYNX:no exudate, no erythema and lips, buccal mucosa, and tongue normal  NECK: supple, thyroid normal size, non-tender, without nodularity  LYMPH: no palpable lymphadenopathy in the cervical, axillary or inguinal  LUNGS: clear to auscultation and percussion with normal breathing effort  HEART: regular rate & rhythm and no murmurs and 1+lower extremity edema  ABDOMEN:abdomen soft, non-tender and normal bowel sounds  Musculoskeletal:no cyanosis of digits and no clubbing  NEURO: alert & oriented x 3 with fluent speech   Labs:  Lab Results  Component Value Date   WBC 6.0 09/25/2013   HGB 7.9* 09/25/2013   HCT 23.9* 09/25/2013   MCV 108.1* 09/25/2013   PLT 164 09/25/2013   NEUTROABS 5.0 09/25/2013      Chemistry      Component Value Date/Time   NA 137 09/09/2013 0600   K 4.6 09/09/2013 0600   CL 106 09/09/2013 0600   CO2 13* 09/09/2013 0600   BUN 28* 09/09/2013 0600   CREATININE 1.78* 09/09/2013 0600   CREATININE 2.06* 08/19/2013 1618      Component Value Date/Time   CALCIUM 8.2* 09/09/2013 0600   ALKPHOS 244* 09/08/2013 0011   AST 44* 09/08/2013 0011   ALT 26 09/08/2013 0011   BILITOT 0.5 09/08/2013 0011     Results for Michelle [name], Michelle M (MRN [id number]) as of 09/25/2013 15:03  Ref. Range 09/09/2013 13:04  Erythropoietin Latest  Range: 2.6-18.5 mIU/mL 18.2   Results for Michelle [name], Michelle M (MRN [id number]) as of 09/25/2013 17:13  Ref. Range 09/08/2013 00:11 09/25/2013 13:54  LDH Latest Range: 125-245 U/L 263 (H) 314 (H)   Results for Michelle [name], Michelle [name] [id number]) as of 09/25/2013 17:13  Ref. Range 09/08/2013 09:15  Haptoglobin Latest Range: 45-215 mg/dL 84   CBC:  Recent Labs Lab 09/25/13 1353  WBC 6.0  NEUTROABS 5.0  HGB 7.9*  HCT 23.9*  MCV 108.1*  PLT 164    Anemia work up No results found for this basename: VITAMINB12, FOLATE, FERRITIN, TIBC, IRON, RETICCTPCT,  in the last 72 hours  Studies:  No results found.   RADIOGRAPHIC STUDIES: Dg Hip Complete Right  08/26/2013   CLINICAL DATA:  Five-day history of right hip pain  EXAM: RIGHT HIP - COMPLETE 2+ VIEW  COMPARISON:  None.  FINDINGS: A single orthopedic screw is present within the right acetabulum. 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 No evidence of acute fracture or malalignment. No conventional radiographic changes to suggest avascular necrosis. Atherosclerotic calcifications noted in the pelvic vessels. Bony mineralization is within normal limits. No lytic or blastic osseous lesion.  IMPRESSION: 1. 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 2. Single orthopedic screw present within the right acetabulum.   Electronically Signed   By: Malachy Moan M.D.   On: 08/26/2013 17:48   Ct Head Wo Contrast  09/08/2013   CLINICAL DATA:  Headaches.  EXAM: CT HEAD WITHOUT CONTRAST  TECHNIQUE: Contiguous axial images were obtained from the base of the skull through the vertex without intravenous contrast.  COMPARISON:  07/09/2013  FINDINGS: Prominence of the sulci and ventricles compatible with brain atrophy. Mild low attenuation within the subcortical and periventricular white matter is identified consistent with chronic small vessel ischemic  disease. No acute cortical infarct, hemorrhage, or mass lesion ispresent. No significant extra-axial fluid collection is present. The paranasal sinuses andmastoid air cells are clear. The osseous skull is intact.  IMPRESSION: 1. No acute intracranial abnormalities. 2. Stable appearance of mild chronic small vessel ischemic disease and atrophy.   Electronically Signed   By: Signa Kell M.D.   On: 09/08/2013 01:36   Ct Chest Wo Contrast  09/08/2013   CLINICAL DATA:  Evaluate left lower lobe nodule, history of HIV, hepatitis-C, chronic kidney disease, diabetes  EXAM: CT CHEST WITHOUT CONTRAST  TECHNIQUE: Multidetector CT imaging of the chest was performed following the standard protocol without IV contrast.  COMPARISON:  DG CHEST 1V PORT dated 07/19/2013; DG CHEST 2 VIEW dated 06/18/2013; CT CHEST W/O CM dated 06/19/2013; CT HEAD W/O CM dated 09/08/2013  FINDINGS: The cavitary approximately 2.7 x 1.7 cm nodule within the left lower lobe (image 31, series 3 is unchanged to minimally increased in size in the interval, previously, 2.2 x 1.8 cm. There is a minimal amount of adjacent subsegmental atelectasis within the left lower lobe, unchanged. There is minimal subsegmental atelectasis within in the right lower lobe. Very minimal dependent subpleural ground-glass atelectasis with the medial basilar segment of the right lower lobe. No new focal airspace opacities. No pleural effusion or pneumothorax. The central pulmonary airways are widely patent.  Scattered shotty mediastinal lymph nodes are individually not enlarged by size criteria. No definitive mediastinal, hilar or axillary lymphadenopathy on this noncontrast examination.  Normal heart size. Coronary artery calcifications. No pericardial effusion. There is mild decreased attenuation of the intra cardiac blood pool suggestive of anemia. Borderline enlarged caliber of the main pulmonary artery measuring approximately 3.2 cm (image 28, series 2). Minimal  atherosclerotic plaque within the posterior aspect of the aortic arch. No definite intramural hematoma. Conventional configuration of the aortic arch.  Limited visualization of the upper abdomen demonstrates mild diffuse thickening of the crux of the left adrenal gland but is otherwise unremarkable.  No acute or aggressive osseous abnormalities. There is pulsation artifact degrading evaluation of the manubrium and superior aspect of the sternum.  Regional soft tissues appear normal. Normal noncontrast appearance of the thyroid gland.  IMPRESSION: 1. The cavitary approximately 2.7 cm nodule within the left lower lobe has minimally increased in size in the interval, previously, 2.2 cm - differential considerations again include inflammatory/infectious etiologies (favored in this patient with HIV), though malignancy is not excluded. Further evaluation with bronchoscopy and/or PET scan could be performed as clinically indicated. 2. No acute cardiopulmonary disease. 3. Coronary artery calcifications.   Electronically Signed   By: Simonne Come M.D.   On: 09/08/2013 11:20    ASSESSMENT: Nevayah Faust Patchell 53 y.o. female with a history of Autoimmune hemolytic anemia - Plan: CBC with Differential, CBC with Differential, Comprehensive metabolic panel (Cmet) - [facility], Lactate dehydrogenase, Lactate dehydrogenase, Haptoglobin, Haptoglobin  PLAN:   1. AIHA.  --Thought to be medication induced secondary to ceftriaxone, diagnosed during last admission. Hgb baseline 8-9, now 7.9 today. We continued her prednisone taper at 40 mg daily today with plans to decrease to 30 mg daily on 10/09/13 for 1 month, then re-evaluate with CBC, haptoglobin, LDH in the clinic. We will repeat CBC and hemolysis markers in 2 weeks and upon return visit in one month. She is asymptomatic for anemia today.   --If remission persists next visit, further reduce the dosing to 20 mg/day for one month then 20 mg alternating with 10 mg/day for one month. If  remission persists, omit the dose on alternate days while maintaining the dose at 20 mg every other day. If remission persists, reduce the dose to 10 mg/day on alternate days. Tapering of glucocorticoids should be continued as long as the hemoglobin and haptoglobin levels remain improved and stable, lactate dehydrogenase levels stay low, and the absolute reticulocyte count remains below 100,000/microL.   --Glucocorticoids can be stopped when there is normalization of the hemoglobin, haptoglobin, LDH, and absolute reticulocyte count, although the Coombs test may remain positive. The patient should be monitored for recurrence for a number of months following cessation of treatment.  If, at any time during this dose tapering process, the remission is not maintained, other measures (eg, addition of cytotoxic therapy, splenectomy, rituxan) must be instituted. She counseled extensively on symptoms of anemia including fatigue, worsening dyspnea on exertion.   2. Chronic kidney disease. --Baseline EPO levels are low.  She will see nephrology later this week.   All questions were answered. The patient knows to call the clinic with any problems, questions or concerns. We can certainly see the patient much sooner if necessary.  I spent 15 minutes counseling the patient face to face. The total time spent in the appointment was 25 minutes.    Myra Rude, MD 09/25/2013 5:08 PM

## 2013-09-26 ENCOUNTER — Telehealth: Payer: Self-pay | Admitting: Internal Medicine

## 2013-09-26 ENCOUNTER — Telehealth: Payer: Self-pay | Admitting: *Deleted

## 2013-09-26 ENCOUNTER — Ambulatory Visit (INDEPENDENT_AMBULATORY_CARE_PROVIDER_SITE_OTHER): Payer: Medicaid Other | Admitting: Internal Medicine

## 2013-09-26 ENCOUNTER — Encounter: Payer: Self-pay | Admitting: Internal Medicine

## 2013-09-26 VITALS — BP 187/92 | HR 73 | Temp 97.6°F | Wt 206.0 lb

## 2013-09-26 DIAGNOSIS — B1920 Unspecified viral hepatitis C without hepatic coma: Secondary | ICD-10-CM

## 2013-09-26 DIAGNOSIS — B20 Human immunodeficiency virus [HIV] disease: Secondary | ICD-10-CM

## 2013-09-26 DIAGNOSIS — N189 Chronic kidney disease, unspecified: Secondary | ICD-10-CM

## 2013-09-26 MED ORDER — DARUNAVIR ETHANOLATE 800 MG PO TABS: 800.0000 mg | ORAL_TABLET | Freq: Every day | ORAL | Status: DC

## 2013-09-26 MED ORDER — RITONAVIR 100 MG PO TABS: 100.0000 mg | ORAL_TABLET | Freq: Every day | ORAL | Status: DC

## 2013-09-26 NOTE — Telephone Encounter (Signed)
Gave pt appt for lab and MD for May

## 2013-09-26 NOTE — Telephone Encounter (Signed)
Per Dr Drue Second called patient pharmacy and D/C the Rx Kaletra and Epzicom and add Prezista 800 mg and Norvir 100 mg both once daily. The patient is aware of the changes.

## 2013-09-26 NOTE — Progress Notes (Signed)
Subjective:    Patient ID: Michelle [name], female    DOB: 10-29-1960, 53 y.o.   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 AIHA on pred 71m. Has had numerous hospitalizations since we last saw her in Dec 2014 for HTN urgency, anemia. She is followed in IM residents clinic for management of HTN and CKD. Still not at goal with multiple antihypertensives. Today, in clinic, she came by coordinating medicare ride,   Unable to tell [place] regimen for hiv ( latest discharge placed her back on kaletra instead of DRVr) Current Outpatient Prescriptions on File Prior to Visit  Medication Sig Dispense Refill  . abacavir (ZIAGEN) 300 MG tablet Take 2 tablets (600 mg total) by mouth daily.  60 tablet  6  . aspirin EC 81 MG tablet Take 1 tablet (81 mg total) by mouth daily.  30 tablet  11  . Blood Glucose Monitoring Suppl (ACCU-CHEK AVIVA PLUS) W/DEVICE KIT USE AS DIRECTED  1 kit  0  . cholecalciferol (VITAMIN D) 1000 UNITS tablet Take 1,000 Units by mouth daily.      . cloNIDine (CATAPRES) 0.2 MG tablet Take 1 tablet (0.2 mg total) by mouth 2 (two) times daily.  60 tablet  11  . cyclobenzaprine (FLEXERIL) 5 MG tablet Take 1 tablet (5 mg total) by mouth daily as needed for muscle spasms.  10 tablet  0  . EPZICOM 600-300 MG per tablet TAKE 1 TABLET BY MOUTH EVERY DAY  30 tablet  3  . furosemide (LASIX) 40 MG tablet Take 1 tablet (40 mg total) by mouth daily.  30 tablet  1  . gentamicin (GARAMYCIN) 0.3 % ophthalmic ointment Place 1 application into both eyes 4 (four) times daily.      . hydrALAZINE (APRESOLINE) 50 MG tablet Take 1 tablet (50 mg total) by mouth 4 (four) times daily.  120 tablet  3  . hydrOXYzine (ATARAX/VISTARIL) 10 MG tablet Take 1 tablet (10 mg total) by mouth every 8 (eight) hours as needed for itching.  90 tablet  5  . insulin aspart (NOVOLOG) 100 UNIT/ML  injection Inject 10 Units into the skin 3 (three) times daily before meals. Home Sliding scale  10 mL  3  . insulin glargine (LANTUS) 100 UNIT/ML injection Inject 30 Units into the skin at bedtime.      .Marland Kitchenlabetalol (NORMODYNE) 200 MG tablet Take 1 tablet (200 mg total) by mouth 2 (two) times daily.  60 tablet  2  . lamiVUDine (EPIVIR) 150 MG tablet Take 1 tablet (150 mg total) by mouth daily.  30 tablet  6  . levETIRAcetam (KEPPRA) 500 MG tablet Take 1 tablet (500 mg total) by mouth 2 (two) times daily.  60 tablet  2  . lisinopril (PRINIVIL,ZESTRIL) 40 MG tablet Take 1 tablet (40 mg total) by mouth daily.  90 tablet  3  . lopinavir-ritonavir (KALETRA) 200-50 MG per tablet Take 2 tablets by mouth 2 (two) times daily.      .Marland KitchenLORazepam (ATIVAN) 1 MG tablet Take 1 mg by mouth daily. Take one tablet for seizure prevention.      .Marland KitchenoxyCODONE-acetaminophen (PERCOCET) 10-325 MG per tablet Take 1 tablet by mouth every 4 (four) hours as needed for pain.  20 tablet  0  . pantoprazole (PROTONIX) 40 MG tablet Take 1 tablet (40 mg total)  by mouth daily.  30 tablet  11  . phenytoin (DILANTIN INFATABS) 50 MG tablet Chew 4 tablets (200 mg total) by mouth 2 (two) times daily.  240 tablet  2  . predniSONE (DELTASONE) 20 MG tablet Take 2 tablets (40 mg total) by mouth daily with breakfast.  60 tablet  0  . sodium bicarbonate 650 MG tablet Take 2 tablets (1,300 mg total) by mouth 2 (two) times daily.  60 tablet  2   No current facility-administered medications on file prior to visit.   Active Ambulatory Problems    Diagnosis Date Noted  . Seizure 02/14/2013  . Unspecified essential hypertension 02/15/2013  . HIV disease 02/16/2013  . Gout 02/16/2013  . CKD (chronic kidney disease) stage 3, GFR 30-59 ml/min 05/10/2013  . CVA (cerebral vascular accident) 05/10/2013  . Diabetes 05/10/2013  . Migraines 05/10/2013  . Autoimmune hemolytic anemia 05/31/2013  . Pulmonary hypertension 06/01/2013  . Unspecified  protein-calorie malnutrition 06/01/2013  . HCV (hepatitis C virus) 06/20/2013  . Lung nodule 06/20/2013  . Nephrotic syndrome 06/20/2013  . Suicidal ideation 08/03/2013  . Pruritus 08/24/2013  . End stage renal disease 09/05/2013  . Anemia 09/08/2013   Resolved Ambulatory Problems    Diagnosis Date Noted  . 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 CKD (chronic kidney disease) 02/15/2013  . Proteinuria 02/15/2013  . Muscle spasm 02/16/2013  . Accelerated hypertension 05/10/2013  . Macrocytic anemia 05/10/2013  . Herpes simplex 05/10/2013  . Hypertensive emergency 05/10/2013  . Left Wrist arthralgia 05/13/2013  . Hypertensive urgency 05/21/2013  . Encephalopathy, hypertensive 05/29/2013  . Gout flare 05/31/2013  . Diastolic dysfunction-grade 2 06/01/2013  . Lower leg edema 06/12/2013  . Seizures   . Stroke   . Diabetes mellitus without complication   . HIV (human immunodeficiency virus infection)   . Hypertension   . CKD (chronic kidney disease)   . CHF exacerbation 06/18/2013  . Shortness of breath 06/19/2013  . Leg edema 06/20/2013  . Weight gain 06/20/2013  . Nodule of left lung 06/20/2013  . Cavitary lesion of lung 06/20/2013  . Cirrhosis 06/20/2013  . Acute diastolic heart failure 93/23/5573  . Red eyes 07/01/2013  . Acute on chronic renal failure 07/05/2013  . Diabetic ketoacidosis 07/19/2013  . Uremia syndrome 07/19/2013  . E coli bacteremia 07/20/2013  . Diarrhea 07/20/2013  . Possible HAP 07/20/2013  . Candida rash of groin 08/10/2013   Past Medical History  Diagnosis Date  . Meningitis   . Muscle spasms of head and/or neck   . CHF (congestive heart failure)   . Type II diabetes mellitus   . AIHA (autoimmune hemolytic anemia)   . Hypertensive encephalopathy ~ 05/2013  . Daily headache   . Exertional shortness of breath   . Anxiety   . History of syphilis   . High cholesterol       Review of Systems 10 point ros is negative    Objective:   Physical  Exam BP 187/92  Pulse 73  Temp(Src) 97.6 F (36.4 C) (Oral)  Wt 206 lb (93.441 kg) Physical Exam  Constitutional:  oriented to person, place, and time. appears well-developed and well-nourished. No distress.  HENT:  Mouth/Throat: Oropharynx is clear and moist. No oropharyngeal exudate.  Cardiovascular: Normal rate, regular rhythm and normal heart sounds. Exam reveals no gallop and no friction rub.  No murmur heard.  Pulmonary/Chest: Effort normal and breath sounds normal. No respiratory distress.  has no wheezes.  Abdominal: Soft. Bowel sounds are  normal.  exhibits no distension. There is no tenderness.  Lymphadenopathy: no cervical adenopathy.  Neurological: alert and oriented to person, place, and time.  Skin: Skin is warm and dry. No rash noted. No erythema.  Psychiatric: a normal mood and affect. His behavior is normal.        Assessment & Plan:  HIV = well controlled but will need help in determining what she is taking. Her med list looks like she is taking abacavir/lam combo pill plus taking them individually. It also appears that she was restarted on kaletra instead of being on darunavir and ritonavir.we have reviewed with her what her regimen needs to be as well as given her a picture guide: she will be on 4 drugs for her HIV:  abacavir 322m bid, lamivudine 1563mdaily, darunavir 80057maily& ritonavir 100m68mily. We have called pharmacy to discontinue epzicom and kaletra.  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 Will decide to initiate therapy if able to get better attendance in clinic visits  HTN = slightly better controlled, at least not > 200 sbp. Continue with current regimen. Defer to PCP for further titration of meds  CKD = will adjust meds based on CrCl calculation of 47  rtc in 4 wk

## 2013-09-27 ENCOUNTER — Encounter: Payer: Self-pay | Admitting: Internal Medicine

## 2013-09-27 ENCOUNTER — Ambulatory Visit (INDEPENDENT_AMBULATORY_CARE_PROVIDER_SITE_OTHER): Payer: Medicaid Other | Admitting: Internal Medicine

## 2013-09-27 VITALS — BP 177/86 | HR 86 | Temp 97.8°F | Ht 65.0 in | Wt 210.8 lb

## 2013-09-27 DIAGNOSIS — D591 Autoimmune hemolytic anemia, unspecified: Secondary | ICD-10-CM

## 2013-09-27 DIAGNOSIS — I10 Essential (primary) hypertension: Secondary | ICD-10-CM

## 2013-09-27 DIAGNOSIS — E119 Type 2 diabetes mellitus without complications: Secondary | ICD-10-CM

## 2013-09-27 LAB — GLUCOSE, CAPILLARY: GLUCOSE-CAPILLARY: 392 mg/dL — AB (ref 70–99)

## 2013-09-27 NOTE — Patient Instructions (Signed)
Thank you for bringing your medicines today. This helps [place] keep you safe from mistakes.  For your blood pressure:   Take hydralazine 50mg  Four times a day and we will see you back in 1 week to recheck your blood pressure  For your blood problems:   Start taking prednisone 30 mg on 10/09/13   Glad you are doing better

## 2013-09-27 NOTE — Assessment & Plan Note (Addendum)
BP Readings from Last 3 Encounters:  09/27/13 177/86  09/26/13 187/92  09/25/13 191/87   Pt continues to have elevated blood pressures. Pt didn't begin already increased hydralazine dose for fear of interferring with other physician visits. Pt had elevated readings as shown above at those appts. 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 Extensive repeated education on importance and need for medication compliance and decreased salt intake repeated with patient.  -see in 1 wk  -cont with just increased hydralazine 50mg  QID  -bmet at next visit

## 2013-09-27 NOTE — Progress Notes (Signed)
Subjective:    Patient ID: Michelle [name], female    DOB: 03/31/1961, 53 y.o.   MRN: [id number]  HPI Michelle [name] is a 53 yo woman pmh as listed below presents for HTN follow up.   The patient states she has not been increasing her hydralazine as prescribed last week as she was concerned it would interfere with her other doctors changing her HIV and anemia medications. She is accompanied by her granddaughter and niece whom watch out for her and state she continues taking large salt intake in the forms of sauces and salt packets. The boxes have been removed and replaced with [employer] products. The patient seems to be doing slightly better with understanding the importance of medication compliance and can only follow simple directions one at a time.   Notes and plans provided by Heme and ID were reviewed directly with the patient. She has not noticed any gross bleeding, CP, HA, fatigue, SOB, easy bruising, or blurry vision.   Review of Systems  Constitutional: Negative for fever, appetite change, fatigue and unexpected weight change.  HENT: Negative for nosebleeds.   Respiratory: Negative for cough, chest tightness and shortness of breath.   Cardiovascular: Negative for chest pain, palpitations and leg swelling.  Gastrointestinal: Negative for diarrhea, constipation, blood in stool and anal bleeding.  Endocrine: Negative for polydipsia and polyuria.  Skin: Negative for rash and wound.  Neurological: Negative for dizziness, light-headedness and headaches.    Past Medical History  Diagnosis Date  . Seizures   . Stroke   . Meningitis   . HIV (human immunodeficiency virus infection)   . Hypertension   . Gout   . Muscle spasms of head and/or neck   . CKD (chronic kidney disease)   . CHF (congestive heart failure)     [name] 06/18/2013  . HCV (hepatitis C virus)     chronic/notes 06/18/2013  . Type II diabetes mellitus     [name] 06/18/2013  . AIHA (autoimmune hemolytic anemia)     [name]  06/18/2013  . Hypertensive encephalopathy ~ 05/2013    hospitalaized/notes 06/18/2013  . Daily headache     "for the last 6 years/notes 06/18/2013  . Exertional shortness of breath     [name] 06/18/2013  . Anxiety     [name] 06/18/2013  . Nephrotic syndrome   . History of syphilis   . High cholesterol    Social, surgical, family history reviewed with patient and updated in appropriate chart locations.  Current Outpatient Prescriptions on File Prior to Visit  Medication Sig Dispense Refill  . abacavir (ZIAGEN) 300 MG tablet Take 2 tablets (600 mg total) by mouth daily.  60 tablet  6  . aspirin EC 81 MG tablet Take 1 tablet (81 mg total) by mouth daily.  30 tablet  11  . Blood Glucose Monitoring Suppl (ACCU-CHEK AVIVA PLUS) W/DEVICE KIT USE AS DIRECTED  1 kit  0  . cholecalciferol (VITAMIN D) 1000 UNITS tablet Take 1,000 Units by mouth daily.      . cloNIDine (CATAPRES) 0.2 MG tablet Take 1 tablet (0.2 mg total) by mouth 2 (two) times daily.  60 tablet  11  . cyclobenzaprine (FLEXERIL) 5 MG tablet Take 1 tablet (5 mg total) by mouth daily as needed for muscle spasms.  10 tablet  0  . Darunavir Ethanolate (PREZISTA) 800 MG tablet Take 1 tablet (800 mg total) by mouth daily with breakfast.  30 tablet  11  . furosemide (LASIX) 40  MG tablet Take 1 tablet (40 mg total) by mouth daily.  30 tablet  1  . gentamicin (GARAMYCIN) 0.3 % ophthalmic ointment Place 1 application into both eyes 4 (four) times daily.      . hydrALAZINE (APRESOLINE) 50 MG tablet Take 1 tablet (50 mg total) by mouth 4 (four) times daily.  120 tablet  3  . hydrOXYzine (ATARAX/VISTARIL) 10 MG tablet Take 1 tablet (10 mg total) by mouth every 8 (eight) hours as needed for itching.  90 tablet  5  . insulin aspart (NOVOLOG) 100 UNIT/ML injection Inject 10 Units into the skin 3 (three) times daily before meals. Home Sliding scale  10 mL  3  . insulin glargine (LANTUS) 100 UNIT/ML injection Inject 30 Units into the skin at bedtime.        [name] labetalol (NORMODYNE) 200 MG tablet Take 1 tablet (200 mg total) by mouth 2 (two) times daily.  60 tablet  2  . lamiVUDine (EPIVIR) 150 MG tablet Take 1 tablet (150 mg total) by mouth daily.  30 tablet  6  . levETIRAcetam (KEPPRA) 500 MG tablet Take 1 tablet (500 mg total) by mouth 2 (two) times daily.  60 tablet  2  . lisinopril (PRINIVIL,ZESTRIL) 40 MG tablet Take 1 tablet (40 mg total) by mouth daily.  90 tablet  3  . LORazepam (ATIVAN) 1 MG tablet Take 1 mg by mouth daily. Take one tablet for seizure prevention.      [name] oxyCODONE-acetaminophen (PERCOCET) 10-325 MG per tablet Take 1 tablet by mouth every 4 (four) hours as needed for pain.  20 tablet  0  . pantoprazole (PROTONIX) 40 MG tablet Take 1 tablet (40 mg total) by mouth daily.  30 tablet  11  . phenytoin (DILANTIN INFATABS) 50 MG tablet Chew 4 tablets (200 mg total) by mouth 2 (two) times daily.  240 tablet  2  . predniSONE (DELTASONE) 20 MG tablet Take 2 tablets (40 mg total) by mouth daily with breakfast.  60 tablet  0  . ritonavir (NORVIR) 100 MG TABS tablet Take 1 tablet (100 mg total) by mouth daily with breakfast.  30 tablet  11  . sodium bicarbonate 650 MG tablet Take 2 tablets (1,300 mg total) by mouth 2 (two) times daily.  60 tablet  2   No current facility-administered medications on file prior to visit.      Objective:   Physical Exam Filed Vitals:   09/27/13 1423  BP: 177/86  Pulse: 86  Temp: 97.8 F (36.6 C)   General: sitting in chair, NAD HEENT: PERRL, EOMI, no scleral icterus Cardiac: RRR, no rubs, murmurs or gallops Pulm: clear to auscultation bilaterally, moving normal volumes of air Abd: soft, nontender, nondistended, BS present Ext: warm and well perfused, no pedal edema Neuro: alert and oriented X3, cranial nerves II-XII grossly intact     Assessment & Plan:  Please see problem oriented charting  Pt discussed with Dr. Stann Mainland

## 2013-09-28 NOTE — Assessment & Plan Note (Signed)
Pt evaluated for follow up with Dr. Rosie Fate and states would like patient to begin taper of prednisone to 30mg  on 10/09/13 and continue for a month with follow up LDH, Hgb, haptoglobin labs drawn before further decrease can continue. This directly reviewed with patient.

## 2013-09-30 ENCOUNTER — Emergency Department (HOSPITAL_COMMUNITY)
Admission: EM | Admit: 2013-09-30 | Discharge: 2013-09-30 | Disposition: A | Discharge status: Home / Self Care | Payer: Medicaid Other | Attending: Emergency Medicine | Admitting: Emergency Medicine

## 2013-09-30 ENCOUNTER — Emergency Department (HOSPITAL_COMMUNITY): Payer: Medicaid Other

## 2013-09-30 ENCOUNTER — Encounter (HOSPITAL_COMMUNITY): Payer: Self-pay | Admitting: Emergency Medicine

## 2013-09-30 DIAGNOSIS — G40909 Epilepsy, unspecified, not intractable, without status epilepticus: Secondary | ICD-10-CM | POA: Insufficient documentation

## 2013-09-30 DIAGNOSIS — Z87891 Personal history of nicotine dependence: Secondary | ICD-10-CM | POA: Insufficient documentation

## 2013-09-30 DIAGNOSIS — E119 Type 2 diabetes mellitus without complications: Secondary | ICD-10-CM | POA: Insufficient documentation

## 2013-09-30 DIAGNOSIS — N186 End stage renal disease: Secondary | ICD-10-CM | POA: Insufficient documentation

## 2013-09-30 DIAGNOSIS — Z79899 Other long term (current) drug therapy: Secondary | ICD-10-CM | POA: Insufficient documentation

## 2013-09-30 DIAGNOSIS — I120 Hypertensive chronic kidney disease with stage 5 chronic kidney disease or end stage renal disease: Secondary | ICD-10-CM | POA: Insufficient documentation

## 2013-09-30 DIAGNOSIS — Z7982 Long term (current) use of aspirin: Secondary | ICD-10-CM | POA: Insufficient documentation

## 2013-09-30 DIAGNOSIS — I509 Heart failure, unspecified: Secondary | ICD-10-CM | POA: Insufficient documentation

## 2013-09-30 DIAGNOSIS — Z8739 Personal history of other diseases of the musculoskeletal system and connective tissue: Secondary | ICD-10-CM | POA: Insufficient documentation

## 2013-09-30 DIAGNOSIS — D649 Anemia, unspecified: Secondary | ICD-10-CM

## 2013-09-30 DIAGNOSIS — Z794 Long term (current) use of insulin: Secondary | ICD-10-CM | POA: Insufficient documentation

## 2013-09-30 DIAGNOSIS — B20 Human immunodeficiency virus [HIV] disease: Secondary | ICD-10-CM

## 2013-09-30 DIAGNOSIS — IMO0002 Reserved for concepts with insufficient information to code with codable children: Secondary | ICD-10-CM | POA: Insufficient documentation

## 2013-09-30 DIAGNOSIS — F411 Generalized anxiety disorder: Secondary | ICD-10-CM | POA: Insufficient documentation

## 2013-09-30 DIAGNOSIS — Z21 Asymptomatic human immunodeficiency virus [HIV] infection status: Secondary | ICD-10-CM | POA: Insufficient documentation

## 2013-09-30 DIAGNOSIS — Z8619 Personal history of other infectious and parasitic diseases: Secondary | ICD-10-CM | POA: Insufficient documentation

## 2013-09-30 DIAGNOSIS — R569 Unspecified convulsions: Secondary | ICD-10-CM

## 2013-09-30 DIAGNOSIS — Z8673 Personal history of transient ischemic attack (TIA), and cerebral infarction without residual deficits: Secondary | ICD-10-CM | POA: Insufficient documentation

## 2013-09-30 DIAGNOSIS — D589 Hereditary hemolytic anemia, unspecified: Secondary | ICD-10-CM | POA: Insufficient documentation

## 2013-09-30 LAB — CBC WITH DIFFERENTIAL/PLATELET
BASOS ABS: 0 10*3/uL (ref 0.0–0.1)
BASOS PCT: 0 % (ref 0–1)
EOS PCT: 1 % (ref 0–5)
Eosinophils Absolute: 0 10*3/uL (ref 0.0–0.7)
HEMATOCRIT: 23.7 % — AB (ref 36.0–46.0)
Hemoglobin: 7.8 g/dL — ABNORMAL LOW (ref 12.0–15.0)
Lymphocytes Relative: 28 % (ref 12–46)
Lymphs Abs: 1.4 10*3/uL (ref 0.7–4.0)
MCH: 35.8 pg — ABNORMAL HIGH (ref 26.0–34.0)
MCHC: 32.9 g/dL (ref 30.0–36.0)
MCV: 108.7 fL — AB (ref 78.0–100.0)
MONO ABS: 0.5 10*3/uL (ref 0.1–1.0)
Monocytes Relative: 11 % (ref 3–12)
NEUTROS ABS: 3.1 10*3/uL (ref 1.7–7.7)
Neutrophils Relative %: 60 % (ref 43–77)
PLATELETS: 151 10*3/uL (ref 150–400)
RBC: 2.18 MIL/uL — ABNORMAL LOW (ref 3.87–5.11)
RDW: 15 % (ref 11.5–15.5)
WBC: 5.1 10*3/uL (ref 4.0–10.5)

## 2013-09-30 LAB — I-STAT CHEM 8, ED
BUN: 42 mg/dL — ABNORMAL HIGH (ref 6–23)
CHLORIDE: 115 meq/L — AB (ref 96–112)
Calcium, Ion: 0.94 mmol/L — ABNORMAL LOW (ref 1.12–1.23)
Creatinine, Ser: 2 mg/dL — ABNORMAL HIGH (ref 0.50–1.10)
GLUCOSE: 119 mg/dL — AB (ref 70–99)
HCT: 20 % — ABNORMAL LOW (ref 36.0–46.0)
Hemoglobin: 6.8 g/dL — CL (ref 12.0–15.0)
Potassium: 3.7 mEq/L (ref 3.7–5.3)
Sodium: 145 mEq/L (ref 137–147)
TCO2: 20 mmol/L (ref 0–100)

## 2013-09-30 LAB — URINALYSIS, ROUTINE W REFLEX MICROSCOPIC
Bilirubin Urine: NEGATIVE
GLUCOSE, UA: NEGATIVE mg/dL
Ketones, ur: NEGATIVE mg/dL
Nitrite: NEGATIVE
PH: 6.5 (ref 5.0–8.0)
Protein, ur: >300 mg/dL — AB
SPECIFIC GRAVITY, URINE: 1.021 (ref 1.005–1.030)
Urobilinogen, UA: 1 mg/dL (ref 0.0–1.0)

## 2013-09-30 LAB — RAPID URINE DRUG SCREEN, HOSP PERFORMED
Amphetamines: NOT DETECTED
Barbiturates: NOT DETECTED
Benzodiazepines: NOT DETECTED
Cocaine: NOT DETECTED
OPIATES: NOT DETECTED
TETRAHYDROCANNABINOL: NOT DETECTED

## 2013-09-30 LAB — COMPREHENSIVE METABOLIC PANEL
ALBUMIN: 2 g/dL — AB (ref 3.5–5.2)
ALT: 67 U/L — ABNORMAL HIGH (ref 0–35)
AST: 50 U/L — AB (ref 0–37)
Alkaline Phosphatase: 212 U/L — ABNORMAL HIGH (ref 39–117)
BUN: 41 mg/dL — ABNORMAL HIGH (ref 6–23)
CALCIUM: 6.7 mg/dL — AB (ref 8.4–10.5)
CO2: 16 mEq/L — ABNORMAL LOW (ref 19–32)
Chloride: 114 mEq/L — ABNORMAL HIGH (ref 96–112)
Creatinine, Ser: 1.75 mg/dL — ABNORMAL HIGH (ref 0.50–1.10)
GFR calc Af Amer: 37 mL/min — ABNORMAL LOW (ref >90)
GFR calc non Af Amer: 32 mL/min — ABNORMAL LOW (ref >90)
Glucose, Bld: 117 mg/dL — ABNORMAL HIGH (ref 70–99)
Potassium: 3.7 mEq/L (ref 3.7–5.3)
Sodium: 145 mEq/L (ref 137–147)
Total Bilirubin: 0.3 mg/dL (ref 0.3–1.2)
Total Protein: 5.8 g/dL — ABNORMAL LOW (ref 6.0–8.3)

## 2013-09-30 LAB — URINE MICROSCOPIC-ADD ON

## 2013-09-30 LAB — ETHANOL

## 2013-09-30 LAB — I-STAT TROPONIN, ED: Troponin i, poc: 0.04 ng/mL (ref 0.00–0.08)

## 2013-09-30 LAB — CBG MONITORING, ED
GLUCOSE-CAPILLARY: 131 mg/dL — AB (ref 70–99)
GLUCOSE-CAPILLARY: 113 mg/dL — AB (ref 70–99)

## 2013-09-30 LAB — AMMONIA: Ammonia: 58 umol/L (ref 11–60)

## 2013-09-30 LAB — I-STAT CG4 LACTIC ACID, ED: LACTIC ACID, VENOUS: 1.22 mmol/L (ref 0.5–2.2)

## 2013-09-30 LAB — PHENYTOIN LEVEL, TOTAL: Phenytoin Lvl: 6.4 ug/mL — ABNORMAL LOW (ref 10.0–20.0)

## 2013-09-30 MED ORDER — SODIUM CHLORIDE 0.9 % IV SOLN
1000.0000 mg | Freq: Once | INTRAVENOUS | Status: AC
  Administered 2013-09-30: 1000 mg via INTRAVENOUS
  Filled 2013-09-30: qty 20

## 2013-09-30 MED ORDER — LORAZEPAM 2 MG/ML IJ SOLN
INTRAMUSCULAR | Status: AC
  Administered 2013-09-30: 2 mg
  Filled 2013-09-30: qty 1

## 2013-09-30 MED ORDER — LABETALOL HCL 5 MG/ML IV SOLN
20.0000 mg | Freq: Once | INTRAVENOUS | Status: AC
  Administered 2013-09-30: 20 mg via INTRAVENOUS
  Filled 2013-09-30: qty 4

## 2013-09-30 NOTE — ED Notes (Signed)
Pt assisted up to BR to void shaky but able to pee by herself, sister here to pick pt up and she states that she understands d/c instructions also

## 2013-09-30 NOTE — ED Notes (Signed)
CBG Taken = 131

## 2013-09-30 NOTE — ED Notes (Signed)
Pt told she is going home after nurse woke her up. Pt oriented to time and place after asking where she was. I called  Sister Sherran Needs at [phone] and she states she will come and get pt and take her home

## 2013-09-30 NOTE — ED Provider Notes (Signed)
CSN: [id number]     Arrival date & time 09/30/13  0127 History   First MD Initiated Contact with Patient 09/30/13 0148     Chief Complaint  Patient presents with  . Seizures     (Consider location/radiation/quality/duration/timing/severity/associated sxs/prior Treatment) HPI  Please note that this is a late entry. The patient is a 53 yo woman with multiple chronic medical problems including a history of AIDS, seizure d/o, CKD and several other co-morbidities. Please see EHR for details. She is BIB EMS from home after a seizure. She had a reported seizure PTA. EMS states that family was at the scene and witnessed seizure. However, family has not come to the ED and attempt to reach using contact for next of kin on chart met with voicemail.   Patient initially non-verbal, then non-sensical, then oriented and answering questions. Denies pain. Says she is compliant with Dilantin. Denies fever.   Past Medical History  Diagnosis Date  . Seizures   . Stroke   . Meningitis   . HIV (human immunodeficiency virus infection)   . Hypertension   . Gout   . Muscle spasms of head and/or neck   . CKD (chronic kidney disease)   . CHF (congestive heart failure)     [name] 06/18/2013  . HCV (hepatitis C virus)     chronic/notes 06/18/2013  . Type II diabetes mellitus     [name] 06/18/2013  . AIHA (autoimmune hemolytic anemia)     [name] 06/18/2013  . Hypertensive encephalopathy ~ 05/2013    hospitalaized/notes 06/18/2013  . Daily headache     "for the last 6 years/notes 06/18/2013  . Exertional shortness of breath     [name] 06/18/2013  . Anxiety     [name] 06/18/2013  . Nephrotic syndrome   . History of syphilis   . High cholesterol    Past Surgical History  Procedure Laterality Date  . Hip pinning Right   . Av fistula placement Right 07/24/2013    Procedure: RIGHT arm exploration of antecubital space;  Surgeon: Elam Dutch, MD;  Location: [facility] OR;  Service: Vascular;  Laterality: Right;    Family History  Problem Relation Age of Onset  . Cancer - Colon Mother   . Cancer Father   . Hypertension Father   . Diabetes    . Diabetes Sister    History  Substance Use Topics  . Smoking status: Former Smoker    Types: Cigarettes    Quit date: 06/19/2010  . Smokeless tobacco: Never Used  . Alcohol Use: No   OB History   Grav Para Term Preterm Abortions TAB SAB Ect Mult Living                 Review of Systems Ten point review of symptoms performed and is negative with the exception of symptoms noted above.     Allergies  Ceftriaxone; Norvasc; and Morphine and related  Home Medications   Prior to Admission medications   Medication Sig Start Date End Date Taking? Authorizing Provider  abacavir (ZIAGEN) 300 MG tablet Take 2 tablets (600 mg total) by mouth daily. 06/20/13   Lesly Dukes, MD  aspirin EC 81 MG tablet Take 1 tablet (81 mg total) by mouth daily. 06/02/13   Lesly Dukes, MD  Blood Glucose Monitoring Suppl (ACCU-CHEK AVIVA PLUS) W/DEVICE KIT USE AS DIRECTED 09/04/13   Karren Cobble, MD  cholecalciferol (VITAMIN D) 1000 UNITS tablet Take 1,000 Units by mouth daily.    Historical  Provider, MD  cloNIDine (CATAPRES) 0.2 MG tablet Take 1 tablet (0.2 mg total) by mouth 2 (two) times daily. 08/05/13 08/05/14  Olga Millers, MD  cyclobenzaprine (FLEXERIL) 5 MG tablet Take 1 tablet (5 mg total) by mouth daily as needed for muscle spasms. 09/09/13   Corky Sox, MD  Darunavir Ethanolate (PREZISTA) 800 MG tablet Take 1 tablet (800 mg total) by mouth daily with breakfast. 09/26/13   Carlyle Basques, MD  furosemide (LASIX) 40 MG tablet Take 1 tablet (40 mg total) by mouth daily. 09/04/13   Karren Cobble, MD  gentamicin (GARAMYCIN) 0.3 % ophthalmic ointment Place 1 application into both eyes 4 (four) times daily.    Historical Provider, MD  hydrALAZINE (APRESOLINE) 50 MG tablet Take 1 tablet (50 mg total) by mouth 4 (four) times daily. 09/20/13   Clinton Gallant, MD  hydrOXYzine  (ATARAX/VISTARIL) 10 MG tablet Take 1 tablet (10 mg total) by mouth every 8 (eight) hours as needed for itching. 09/04/13   Karren Cobble, MD  insulin aspart (NOVOLOG) 100 UNIT/ML injection Inject 10 Units into the skin 3 (three) times daily before meals. Home Sliding scale 08/09/13   Clinton Gallant, MD  insulin glargine (LANTUS) 100 UNIT/ML injection Inject 30 Units into the skin at bedtime.    Historical Provider, MD  labetalol (NORMODYNE) 200 MG tablet Take 1 tablet (200 mg total) by mouth 2 (two) times daily. 09/20/13   Clinton Gallant, MD  lamiVUDine (EPIVIR) 150 MG tablet Take 1 tablet (150 mg total) by mouth daily. 06/20/13   Lesly Dukes, MD  levETIRAcetam (KEPPRA) 500 MG tablet Take 1 tablet (500 mg total) by mouth 2 (two) times daily. 09/09/13   Corky Sox, MD  lisinopril (PRINIVIL,ZESTRIL) 40 MG tablet Take 1 tablet (40 mg total) by mouth daily. 09/04/13   Karren Cobble, MD  LORazepam (ATIVAN) 1 MG tablet Take 1 mg by mouth daily. Take one tablet for seizure prevention.    Historical Provider, MD  oxyCODONE-acetaminophen (PERCOCET) 10-325 MG per tablet Take 1 tablet by mouth every 4 (four) hours as needed for pain. 09/20/13   Clinton Gallant, MD  pantoprazole (PROTONIX) 40 MG tablet Take 1 tablet (40 mg total) by mouth daily. 06/02/13   Lesly Dukes, MD  phenytoin (DILANTIN INFATABS) 50 MG tablet Chew 4 tablets (200 mg total) by mouth 2 (two) times daily. 09/09/13   Corky Sox, MD  predniSONE (DELTASONE) 20 MG tablet Take 2 tablets (40 mg total) by mouth daily with breakfast. 09/09/13   Corky Sox, MD  ritonavir (NORVIR) 100 MG TABS tablet Take 1 tablet (100 mg total) by mouth daily with breakfast. 09/26/13   Carlyle Basques, MD  sodium bicarbonate 650 MG tablet Take 2 tablets (1,300 mg total) by mouth 2 (two) times daily. 09/09/13   Corky Sox, MD   BP 209/83  Pulse 82  Temp(Src) 98.6 F (37 C) (Oral)  Resp 26  SpO2 100% Physical Exam Gen: well developed and well nourished appearing Head:  NCAT Eyes: PERL, EOMI Nose: no epistaixis or rhinorrhea Mouth/throat: mucosa is moist and pink Neck: supple, no stridor Lungs: CTA B, no wheezing, rhonchi or rales CV: RRR, no murmur, extremities appear well perfused.  Abd: soft, notender, nondistended Back: no ttp, no cva ttp Skin: warm and dry Ext: normal to inspection, no dependent edema Neuro: CN ii-xii grossly intact, no focal deficits Psyche; flat  affect,  calm and cooperative.  ED Course  Procedures (including critical care  time) Labs Review  Results for orders placed during the hospital encounter of 09/30/13 (from the past 24 hour(s))  CBG MONITORING, ED     Status: Abnormal   Collection Time    09/30/13  1:48 AM      Result Value Ref Range   Glucose-Capillary 131 (*) 70 - 99 mg/dL  CBG MONITORING, ED     Status: Abnormal   Collection Time    09/30/13  3:09 AM      Result Value Ref Range   Glucose-Capillary 113 (*) 70 - 99 mg/dL  I-STAT TROPOININ, ED     Status: None   Collection Time    09/30/13  4:57 AM      Result Value Ref Range   Troponin i, poc 0.04  0.00 - 0.08 ng/mL   Comment 3           I-STAT CHEM 8, ED     Status: Abnormal   Collection Time    09/30/13  4:58 AM      Result Value Ref Range   Sodium 145  137 - 147 mEq/L   Potassium 3.7  3.7 - 5.3 mEq/L   Chloride 115 (*) 96 - 112 mEq/L   BUN 42 (*) 6 - 23 mg/dL   Creatinine, Ser 2.00 (*) 0.50 - 1.10 mg/dL   Glucose, Bld 119 (*) 70 - 99 mg/dL   Calcium, Ion 0.94 (*) 1.12 - 1.23 mmol/L   TCO2 20  0 - 100 mmol/L   Hemoglobin 6.8 (*) 12.0 - 15.0 g/dL   HCT 20.0 (*) 36.0 - 46.0 %   Comment NOTIFIED PHYSICIAN    CBC WITH DIFFERENTIAL     Status: Abnormal   Collection Time    09/30/13  5:01 AM      Result Value Ref Range   WBC 5.1  4.0 - 10.5 K/uL   RBC 2.18 (*) 3.87 - 5.11 MIL/uL   Hemoglobin 7.8 (*) 12.0 - 15.0 g/dL   HCT 23.7 (*) 36.0 - 46.0 %   MCV 108.7 (*) 78.0 - 100.0 fL   MCH 35.8 (*) 26.0 - 34.0 pg   MCHC 32.9  30.0 - 36.0 g/dL   RDW  15.0  11.5 - 15.5 %   Platelets 151  150 - 400 K/uL   Neutrophils Relative % 60  43 - 77 %   Neutro Abs 3.1  1.7 - 7.7 K/uL   Lymphocytes Relative 28  12 - 46 %   Lymphs Abs 1.4  0.7 - 4.0 K/uL   Monocytes Relative 11  3 - 12 %   Monocytes Absolute 0.5  0.1 - 1.0 K/uL   Eosinophils Relative 1  0 - 5 %   Eosinophils Absolute 0.0  0.0 - 0.7 K/uL   Basophils Relative 0  0 - 1 %   Basophils Absolute 0.0  0.0 - 0.1 K/uL  COMPREHENSIVE METABOLIC PANEL     Status: Abnormal   Collection Time    09/30/13  5:01 AM      Result Value Ref Range   Sodium 145  137 - 147 mEq/L   Potassium 3.7  3.7 - 5.3 mEq/L   Chloride 114 (*) 96 - 112 mEq/L   CO2 16 (*) 19 - 32 mEq/L   Glucose, Bld 117 (*) 70 - 99 mg/dL   BUN 41 (*) 6 - 23 mg/dL   Creatinine, Ser 1.75 (*) 0.50 - 1.10 mg/dL   Calcium 6.7 (*) 8.4 - 10.5 mg/dL  Total Protein 5.8 (*) 6.0 - 8.3 g/dL   Albumin 2.0 (*) 3.5 - 5.2 g/dL   AST 50 (*) 0 - 37 U/L   ALT 67 (*) 0 - 35 U/L   Alkaline Phosphatase 212 (*) 39 - 117 U/L   Total Bilirubin 0.3  0.3 - 1.2 mg/dL   GFR calc non Af Amer 32 (*) >90 mL/min   GFR calc Af Amer 37 (*) >90 mL/min  AMMONIA     Status: None   Collection Time    09/30/13  5:01 AM      Result Value Ref Range   Ammonia 58  11 - 60 umol/L  ETHANOL     Status: None   Collection Time    09/30/13  5:01 AM      Result Value Ref Range   Alcohol, Ethyl (B) <11  0 - 11 mg/dL  PHENYTOIN LEVEL, TOTAL     Status: Abnormal   Collection Time    09/30/13  5:01 AM      Result Value Ref Range   Phenytoin Lvl 6.4 (*) 10.0 - 20.0 ug/mL  I-STAT CG4 LACTIC ACID, ED     Status: None   Collection Time    09/30/13  5:11 AM      Result Value Ref Range   Lactic Acid, Venous 1.22  0.5 - 2.2 mmol/L  URINALYSIS, ROUTINE W REFLEX MICROSCOPIC     Status: Abnormal   Collection Time    09/30/13  6:44 AM      Result Value Ref Range   Color, Urine YELLOW  YELLOW   APPearance TURBID (*) CLEAR   Specific Gravity, Urine 1.021  1.005 - 1.030    pH 6.5  5.0 - 8.0   Glucose, UA NEGATIVE  NEGATIVE mg/dL   Hgb urine dipstick TRACE (*) NEGATIVE   Bilirubin Urine NEGATIVE  NEGATIVE   Ketones, ur NEGATIVE  NEGATIVE mg/dL   Protein, ur >300 (*) NEGATIVE mg/dL   Urobilinogen, UA 1.0  0.0 - 1.0 mg/dL   Nitrite NEGATIVE  NEGATIVE   Leukocytes, UA SMALL (*) NEGATIVE  URINE MICROSCOPIC-ADD ON     Status: Abnormal   Collection Time    09/30/13  6:44 AM      Result Value Ref Range   Squamous Epithelial / LPF RARE  RARE   RBC / HPF 0-2  <3 RBC/hpf   Bacteria, UA MANY (*) RARE      Imaging Review Ct Head Wo Contrast  09/30/2013   CLINICAL DATA:  Acute on chronic seizures.  EXAM: CT HEAD WITHOUT CONTRAST  TECHNIQUE: Contiguous axial images were obtained from the base of the skull through the vertex without intravenous contrast.  COMPARISON:  CT HEAD W/O CM dated 09/08/2013  FINDINGS: Mild motion degraded examination. Mild to moderate ventriculomegaly, likely on the basis of global parenchymal brain volume loss as there is commensurate enlargement of cerebral sulci and cerebellar folia, advanced for age though, stable from prior examination. Patchy supratentorial white matter hypodensities may reflect chronic small vessel ischemic disease, similar. No intraparenchymal hemorrhage, mass effect or midline shift.  Basal cisterns are patent. Mild calcific atherosclerosis of the carotid siphons. Visualized paranasal sinuses and mastoid air cells are well aerated. Patient appears mildly hyperteloric. Trace soft tissue density within the left external auditory canal middle reflect cerumen.  IMPRESSION: No acute intracranial process on this mildly motion degraded examination.  Mild global brain atrophy, advanced for age with mild to moderate white matter changes suggesting advanced  chronic small vessel ischemic disease for age.   Electronically Signed   By: Elon Alas   On: 09/30/2013 03:57      MDM   Patient's mental status has normalized. She has  chronic hemolytic anemia and her H/H are at baseline. She was found to be subtherapeutic on phenytoin and is receiving an IV load. Plan to discharge following this with plan to follow up with PCP and neurologist.     Elyn Peers, MD 09/30/13 [phone]

## 2013-09-30 NOTE — ED Notes (Signed)
Informed Dr. Lavella Lemons that patient is having focal seizure in right arm.  2mg  of ativan IV given at the bedside.

## 2013-09-30 NOTE — ED Notes (Signed)
Reported to Dr. Lavella Lemons that patient's blood pressure is consistently over 200's systolic even though she is calm.  Patient will form longer sentences, and when not stimulated will go back to sleep.

## 2013-09-30 NOTE — ED Notes (Signed)
Patient is argumentative and not cooperating with plan of care.

## 2013-09-30 NOTE — ED Notes (Signed)
Reported to Dr. Lavella Lemons that patient is refusing treatment, not allowing blood draws, and blood pressure readings. Successful IV insertion though before patient became argumentative.  MD acknowledges, no new orders received at this time.

## 2013-09-30 NOTE — ED Notes (Signed)
CBG Taken = 113

## 2013-09-30 NOTE — ED Notes (Addendum)
Attempted to assist phlebotomy for lab draw. Patient becoming agitated. 2nd RN will attempt to draw.

## 2013-09-30 NOTE — ED Notes (Signed)
Per EMS pt had a seizure one hour ago, EMS reports the pt's family witnessed part of the pt's seizure, pt has not spoke since her seizure, EMS reports the pt's family reports this is normal for the pt after one of her seizures, pt takes anti-seizure medications. Unsure if pt missed a dose or if this is a breakthrough. EMS states the pt's eyes do not deviate either way however the pt is leaning towards her right side. Pt HTN. CBG 140.

## 2013-10-02 LAB — URINE CULTURE

## 2013-10-04 ENCOUNTER — Encounter: Payer: Self-pay | Admitting: Internal Medicine

## 2013-10-04 ENCOUNTER — Inpatient Hospital Stay (HOSPITAL_COMMUNITY)
Admission: AD | Admit: 2013-10-04 | Discharge: 2013-10-14 | DRG: 292 | Disposition: A | Discharge status: Home / Self Care | Payer: Medicaid Other | Source: Ambulatory Visit | Attending: Internal Medicine | Admitting: Internal Medicine

## 2013-10-04 ENCOUNTER — Encounter (HOSPITAL_COMMUNITY): Payer: Self-pay | Admitting: General Practice

## 2013-10-04 ENCOUNTER — Ambulatory Visit (INDEPENDENT_AMBULATORY_CARE_PROVIDER_SITE_OTHER): Payer: Medicaid Other | Admitting: Internal Medicine

## 2013-10-04 ENCOUNTER — Inpatient Hospital Stay (HOSPITAL_COMMUNITY): Payer: Medicaid Other

## 2013-10-04 ENCOUNTER — Telehealth (HOSPITAL_BASED_OUTPATIENT_CLINIC_OR_DEPARTMENT_OTHER): Payer: Self-pay

## 2013-10-04 ENCOUNTER — Other Ambulatory Visit: Payer: Self-pay

## 2013-10-04 VITALS — BP 192/86 | HR 80 | Temp 97.5°F | Ht 65.0 in | Wt 221.9 lb

## 2013-10-04 DIAGNOSIS — R600 Localized edema: Secondary | ICD-10-CM | POA: Diagnosis present

## 2013-10-04 DIAGNOSIS — E8770 Fluid overload, unspecified: Secondary | ICD-10-CM

## 2013-10-04 DIAGNOSIS — E1129 Type 2 diabetes mellitus with other diabetic kidney complication: Secondary | ICD-10-CM | POA: Diagnosis present

## 2013-10-04 DIAGNOSIS — Z888 Allergy status to other drugs, medicaments and biological substances status: Secondary | ICD-10-CM

## 2013-10-04 DIAGNOSIS — B1920 Unspecified viral hepatitis C without hepatic coma: Secondary | ICD-10-CM | POA: Diagnosis present

## 2013-10-04 DIAGNOSIS — I10 Essential (primary) hypertension: Secondary | ICD-10-CM

## 2013-10-04 DIAGNOSIS — N184 Chronic kidney disease, stage 4 (severe): Secondary | ICD-10-CM

## 2013-10-04 DIAGNOSIS — E46 Unspecified protein-calorie malnutrition: Secondary | ICD-10-CM | POA: Diagnosis present

## 2013-10-04 DIAGNOSIS — G40909 Epilepsy, unspecified, not intractable, without status epilepticus: Secondary | ICD-10-CM | POA: Diagnosis present

## 2013-10-04 DIAGNOSIS — Z794 Long term (current) use of insulin: Secondary | ICD-10-CM

## 2013-10-04 DIAGNOSIS — Z8673 Personal history of transient ischemic attack (TIA), and cerebral infarction without residual deficits: Secondary | ICD-10-CM

## 2013-10-04 DIAGNOSIS — D649 Anemia, unspecified: Secondary | ICD-10-CM

## 2013-10-04 DIAGNOSIS — D589 Hereditary hemolytic anemia, unspecified: Secondary | ICD-10-CM | POA: Diagnosis present

## 2013-10-04 DIAGNOSIS — B20 Human immunodeficiency virus [HIV] disease: Secondary | ICD-10-CM | POA: Diagnosis present

## 2013-10-04 DIAGNOSIS — D591 Autoimmune hemolytic anemia, unspecified: Secondary | ICD-10-CM | POA: Diagnosis present

## 2013-10-04 DIAGNOSIS — Z21 Asymptomatic human immunodeficiency virus [HIV] infection status: Secondary | ICD-10-CM

## 2013-10-04 DIAGNOSIS — Z87891 Personal history of nicotine dependence: Secondary | ICD-10-CM

## 2013-10-04 DIAGNOSIS — Z9119 Patient's noncompliance with other medical treatment and regimen: Secondary | ICD-10-CM

## 2013-10-04 DIAGNOSIS — E785 Hyperlipidemia, unspecified: Secondary | ICD-10-CM | POA: Diagnosis present

## 2013-10-04 DIAGNOSIS — Z91199 Patient's noncompliance with other medical treatment and regimen due to unspecified reason: Secondary | ICD-10-CM

## 2013-10-04 DIAGNOSIS — N041 Nephrotic syndrome with focal and segmental glomerular lesions: Secondary | ICD-10-CM

## 2013-10-04 DIAGNOSIS — T380X5A Adverse effect of glucocorticoids and synthetic analogues, initial encounter: Secondary | ICD-10-CM | POA: Diagnosis present

## 2013-10-04 DIAGNOSIS — E876 Hypokalemia: Secondary | ICD-10-CM | POA: Diagnosis present

## 2013-10-04 DIAGNOSIS — I5032 Chronic diastolic (congestive) heart failure: Secondary | ICD-10-CM

## 2013-10-04 DIAGNOSIS — R601 Generalized edema: Secondary | ICD-10-CM

## 2013-10-04 DIAGNOSIS — N183 Chronic kidney disease, stage 3 unspecified: Secondary | ICD-10-CM

## 2013-10-04 DIAGNOSIS — I5189 Other ill-defined heart diseases: Secondary | ICD-10-CM | POA: Diagnosis present

## 2013-10-04 DIAGNOSIS — D631 Anemia in chronic kidney disease: Secondary | ICD-10-CM | POA: Diagnosis present

## 2013-10-04 DIAGNOSIS — Z885 Allergy status to narcotic agent status: Secondary | ICD-10-CM

## 2013-10-04 DIAGNOSIS — N039 Chronic nephritic syndrome with unspecified morphologic changes: Secondary | ICD-10-CM

## 2013-10-04 DIAGNOSIS — R569 Unspecified convulsions: Secondary | ICD-10-CM | POA: Diagnosis present

## 2013-10-04 DIAGNOSIS — I129 Hypertensive chronic kidney disease with stage 1 through stage 4 chronic kidney disease, or unspecified chronic kidney disease: Secondary | ICD-10-CM | POA: Diagnosis present

## 2013-10-04 DIAGNOSIS — Z6834 Body mass index (BMI) 34.0-34.9, adult: Secondary | ICD-10-CM

## 2013-10-04 DIAGNOSIS — Z8249 Family history of ischemic heart disease and other diseases of the circulatory system: Secondary | ICD-10-CM

## 2013-10-04 DIAGNOSIS — B182 Chronic viral hepatitis C: Secondary | ICD-10-CM | POA: Diagnosis present

## 2013-10-04 DIAGNOSIS — E119 Type 2 diabetes mellitus without complications: Secondary | ICD-10-CM

## 2013-10-04 DIAGNOSIS — R609 Edema, unspecified: Secondary | ICD-10-CM

## 2013-10-04 DIAGNOSIS — F411 Generalized anxiety disorder: Secondary | ICD-10-CM | POA: Diagnosis present

## 2013-10-04 DIAGNOSIS — D599 Acquired hemolytic anemia, unspecified: Secondary | ICD-10-CM

## 2013-10-04 DIAGNOSIS — Z7982 Long term (current) use of aspirin: Secondary | ICD-10-CM

## 2013-10-04 DIAGNOSIS — N049 Nephrotic syndrome with unspecified morphologic changes: Secondary | ICD-10-CM | POA: Diagnosis present

## 2013-10-04 DIAGNOSIS — I509 Heart failure, unspecified: Secondary | ICD-10-CM | POA: Diagnosis present

## 2013-10-04 DIAGNOSIS — N186 End stage renal disease: Secondary | ICD-10-CM

## 2013-10-04 DIAGNOSIS — I872 Venous insufficiency (chronic) (peripheral): Secondary | ICD-10-CM | POA: Diagnosis present

## 2013-10-04 DIAGNOSIS — N032 Chronic nephritic syndrome with diffuse membranous glomerulonephritis: Secondary | ICD-10-CM | POA: Diagnosis present

## 2013-10-04 DIAGNOSIS — I2789 Other specified pulmonary heart diseases: Secondary | ICD-10-CM | POA: Diagnosis present

## 2013-10-04 DIAGNOSIS — D638 Anemia in other chronic diseases classified elsewhere: Secondary | ICD-10-CM | POA: Diagnosis present

## 2013-10-04 DIAGNOSIS — I272 Pulmonary hypertension, unspecified: Secondary | ICD-10-CM | POA: Diagnosis present

## 2013-10-04 DIAGNOSIS — I5033 Acute on chronic diastolic (congestive) heart failure: Principal | ICD-10-CM | POA: Diagnosis present

## 2013-10-04 DIAGNOSIS — Z833 Family history of diabetes mellitus: Secondary | ICD-10-CM

## 2013-10-04 LAB — COMPREHENSIVE METABOLIC PANEL
ALBUMIN: 2.2 g/dL — AB (ref 3.5–5.2)
ALK PHOS: 272 U/L — AB (ref 39–117)
ALT: 79 U/L — AB (ref 0–35)
AST: 61 U/L — AB (ref 0–37)
BILIRUBIN TOTAL: 0.3 mg/dL (ref 0.3–1.2)
BUN: 38 mg/dL — ABNORMAL HIGH (ref 6–23)
CO2: 21 mEq/L (ref 19–32)
Calcium: 7.2 mg/dL — ABNORMAL LOW (ref 8.4–10.5)
Chloride: 106 mEq/L (ref 96–112)
Creat: 1.85 mg/dL — ABNORMAL HIGH (ref 0.50–1.10)
GLUCOSE: 169 mg/dL — AB (ref 70–99)
POTASSIUM: 3.5 meq/L (ref 3.5–5.3)
SODIUM: 143 meq/L (ref 135–145)
TOTAL PROTEIN: 6.3 g/dL (ref 6.0–8.3)

## 2013-10-04 LAB — CBC WITH DIFFERENTIAL/PLATELET
BASOS ABS: 0 10*3/uL (ref 0.0–0.1)
Basophils Relative: 0 % (ref 0–1)
EOS ABS: 0 10*3/uL (ref 0.0–0.7)
Eosinophils Relative: 0 % (ref 0–5)
HCT: 24.7 % — ABNORMAL LOW (ref 36.0–46.0)
Hemoglobin: 8 g/dL — ABNORMAL LOW (ref 12.0–15.0)
Lymphocytes Relative: 12 % (ref 12–46)
Lymphs Abs: 0.5 10*3/uL — ABNORMAL LOW (ref 0.7–4.0)
MCH: 35.2 pg — AB (ref 26.0–34.0)
MCHC: 32.4 g/dL (ref 30.0–36.0)
MCV: 108.8 fL — ABNORMAL HIGH (ref 78.0–100.0)
MONO ABS: 0.1 10*3/uL (ref 0.1–1.0)
Monocytes Relative: 3 % (ref 3–12)
Neutro Abs: 3.6 10*3/uL (ref 1.7–7.7)
Neutrophils Relative %: 85 % — ABNORMAL HIGH (ref 43–77)
PLATELETS: 160 10*3/uL (ref 150–400)
RBC: 2.27 MIL/uL — ABNORMAL LOW (ref 3.87–5.11)
RDW: 13.6 % (ref 11.5–15.5)
WBC: 4.3 10*3/uL (ref 4.0–10.5)

## 2013-10-04 LAB — URINE MICROSCOPIC-ADD ON

## 2013-10-04 LAB — URINALYSIS, ROUTINE W REFLEX MICROSCOPIC
Bilirubin Urine: NEGATIVE
GLUCOSE, UA: 100 mg/dL — AB
Hgb urine dipstick: NEGATIVE
KETONES UR: NEGATIVE mg/dL
Nitrite: NEGATIVE
PH: 6 (ref 5.0–8.0)
Protein, ur: >300 mg/dL — AB
Specific Gravity, Urine: 1.019 (ref 1.005–1.030)
Urobilinogen, UA: 1 mg/dL (ref 0.0–1.0)

## 2013-10-04 LAB — GLUCOSE, CAPILLARY
Glucose-Capillary: 185 mg/dL — ABNORMAL HIGH (ref 70–99)
Glucose-Capillary: 178 mg/dL — ABNORMAL HIGH (ref 70–99)

## 2013-10-04 LAB — PHENYTOIN LEVEL, TOTAL: PHENYTOIN LVL: 6.8 ug/mL — AB (ref 10.0–20.0)

## 2013-10-04 LAB — TROPONIN I: Troponin I: <0.3 ng/mL (ref <0.30)

## 2013-10-04 LAB — PRO B NATRIURETIC PEPTIDE: Pro B Natriuretic peptide (BNP): 1277 pg/mL — ABNORMAL HIGH (ref <126)

## 2013-10-04 MED ORDER — FUROSEMIDE 10 MG/ML IJ SOLN
80.0000 mg | Freq: Two times a day (BID) | INTRAMUSCULAR | Status: DC
  Administered 2013-10-05 – 2013-10-06 (×3): 80 mg via INTRAVENOUS
  Filled 2013-10-04 (×4): qty 8

## 2013-10-04 MED ORDER — ABACAVIR SULFATE 300 MG PO TABS
600.0000 mg | ORAL_TABLET | Freq: Every day | ORAL | Status: DC
  Filled 2013-10-04: qty 2

## 2013-10-04 MED ORDER — PREDNISONE 20 MG PO TABS
40.0000 mg | ORAL_TABLET | Freq: Every day | ORAL | Status: DC
  Administered 2013-10-05 – 2013-10-07 (×3): 40 mg via ORAL
  Filled 2013-10-04 (×7): qty 2

## 2013-10-04 MED ORDER — LISINOPRIL 40 MG PO TABS
40.0000 mg | ORAL_TABLET | Freq: Every day | ORAL | Status: DC
  Administered 2013-10-05 – 2013-10-14 (×10): 40 mg via ORAL
  Filled 2013-10-04 (×10): qty 1

## 2013-10-04 MED ORDER — OXYCODONE HCL 5 MG PO TABS
5.0000 mg | ORAL_TABLET | ORAL | Status: DC | PRN
  Administered 2013-10-09 – 2013-10-10 (×2): 5 mg via ORAL
  Filled 2013-10-04 (×2): qty 1

## 2013-10-04 MED ORDER — HYDROXYZINE HCL 10 MG PO TABS
10.0000 mg | ORAL_TABLET | Freq: Three times a day (TID) | ORAL | Status: DC | PRN
  Filled 2013-10-04: qty 1

## 2013-10-04 MED ORDER — ABACAVIR SULFATE 300 MG PO TABS
300.0000 mg | ORAL_TABLET | Freq: Two times a day (BID) | ORAL | Status: DC
  Administered 2013-10-04 – 2013-10-14 (×20): 300 mg via ORAL
  Filled 2013-10-04 (×21): qty 1

## 2013-10-04 MED ORDER — VITAMIN D3 25 MCG (1000 UNIT) PO TABS
1000.0000 [IU] | ORAL_TABLET | Freq: Every day | ORAL | Status: DC
  Administered 2013-10-04 – 2013-10-14 (×11): 1000 [IU] via ORAL
  Filled 2013-10-04 (×12): qty 1

## 2013-10-04 MED ORDER — INSULIN GLARGINE 100 UNIT/ML ~~LOC~~ SOLN
15.0000 [IU] | Freq: Every day | SUBCUTANEOUS | Status: DC
  Administered 2013-10-04: 15 [IU] via SUBCUTANEOUS
  Filled 2013-10-04 (×2): qty 0.15

## 2013-10-04 MED ORDER — LEVETIRACETAM 500 MG PO TABS
500.0000 mg | ORAL_TABLET | Freq: Two times a day (BID) | ORAL | Status: DC
  Administered 2013-10-04 – 2013-10-05 (×3): 500 mg via ORAL
  Filled 2013-10-04 (×5): qty 1

## 2013-10-04 MED ORDER — CYCLOBENZAPRINE HCL 10 MG PO TABS: 5.0000 mg | ORAL_TABLET | Freq: Every day | ORAL | Status: DC | PRN

## 2013-10-04 MED ORDER — ASPIRIN EC 81 MG PO TBEC
81.0000 mg | DELAYED_RELEASE_TABLET | Freq: Every day | ORAL | Status: DC
  Administered 2013-10-05 – 2013-10-14 (×10): 81 mg via ORAL
  Filled 2013-10-04 (×10): qty 1

## 2013-10-04 MED ORDER — PANTOPRAZOLE SODIUM 40 MG PO TBEC
40.0000 mg | DELAYED_RELEASE_TABLET | Freq: Every day | ORAL | Status: DC
  Administered 2013-10-05 – 2013-10-14 (×10): 40 mg via ORAL
  Filled 2013-10-04 (×12): qty 1

## 2013-10-04 MED ORDER — LAMIVUDINE 150 MG PO TABS
150.0000 mg | ORAL_TABLET | Freq: Every day | ORAL | Status: DC
  Administered 2013-10-05 – 2013-10-14 (×10): 150 mg via ORAL
  Filled 2013-10-04 (×10): qty 1

## 2013-10-04 MED ORDER — OXYCODONE-ACETAMINOPHEN 5-325 MG PO TABS
1.0000 | ORAL_TABLET | ORAL | Status: DC | PRN
  Administered 2013-10-09 – 2013-10-10 (×2): 1 via ORAL
  Filled 2013-10-04 (×2): qty 1

## 2013-10-04 MED ORDER — OXYCODONE-ACETAMINOPHEN 10-325 MG PO TABS: 1.0000 | ORAL_TABLET | ORAL | Status: DC | PRN

## 2013-10-04 MED ORDER — SODIUM BICARBONATE 650 MG PO TABS
1300.0000 mg | ORAL_TABLET | Freq: Two times a day (BID) | ORAL | Status: DC
  Administered 2013-10-04 – 2013-10-11 (×14): 1300 mg via ORAL
  Filled 2013-10-04 (×15): qty 2

## 2013-10-04 MED ORDER — HEPARIN SODIUM (PORCINE) 5000 UNIT/ML IJ SOLN
5000.0000 [IU] | Freq: Three times a day (TID) | INTRAMUSCULAR | Status: DC
  Administered 2013-10-05 – 2013-10-14 (×28): 5000 [IU] via SUBCUTANEOUS
  Filled 2013-10-04 (×33): qty 1

## 2013-10-04 MED ORDER — LORAZEPAM 1 MG PO TABS
1.0000 mg | ORAL_TABLET | Freq: Every day | ORAL | Status: DC
  Administered 2013-10-05 – 2013-10-14 (×9): 1 mg via ORAL
  Filled 2013-10-04 (×9): qty 1

## 2013-10-04 MED ORDER — CLONIDINE HCL 0.2 MG PO TABS
0.2000 mg | ORAL_TABLET | Freq: Two times a day (BID) | ORAL | Status: DC
  Administered 2013-10-04: 0.2 mg via ORAL
  Filled 2013-10-04 (×3): qty 1

## 2013-10-04 MED ORDER — LABETALOL HCL 200 MG PO TABS
200.0000 mg | ORAL_TABLET | Freq: Two times a day (BID) | ORAL | Status: DC
  Administered 2013-10-04: 200 mg via ORAL
  Filled 2013-10-04 (×3): qty 1

## 2013-10-04 MED ORDER — RITONAVIR 100 MG PO TABS
100.0000 mg | ORAL_TABLET | Freq: Every day | ORAL | Status: DC
  Administered 2013-10-05 – 2013-10-14 (×10): 100 mg via ORAL
  Filled 2013-10-04 (×11): qty 1

## 2013-10-04 MED ORDER — SODIUM CHLORIDE 0.9 % IJ SOLN
3.0000 mL | Freq: Two times a day (BID) | INTRAMUSCULAR | Status: DC
  Administered 2013-10-04 – 2013-10-14 (×14): 3 mL via INTRAVENOUS

## 2013-10-04 MED ORDER — PHENYTOIN 50 MG PO CHEW
200.0000 mg | CHEWABLE_TABLET | Freq: Two times a day (BID) | ORAL | Status: DC
  Administered 2013-10-04 – 2013-10-05 (×3): 200 mg via ORAL
  Filled 2013-10-04 (×5): qty 4

## 2013-10-04 MED ORDER — DARUNAVIR ETHANOLATE 800 MG PO TABS
800.0000 mg | ORAL_TABLET | Freq: Every day | ORAL | Status: DC
  Administered 2013-10-05 – 2013-10-14 (×10): 800 mg via ORAL
  Filled 2013-10-04 (×11): qty 1

## 2013-10-04 MED ORDER — HYDRALAZINE HCL 50 MG PO TABS
50.0000 mg | ORAL_TABLET | Freq: Four times a day (QID) | ORAL | Status: DC
  Administered 2013-10-04 – 2013-10-08 (×16): 50 mg via ORAL
  Filled 2013-10-04 (×22): qty 1

## 2013-10-04 NOTE — Progress Notes (Signed)
Subjective:    Patient ID: Michelle [name], female    DOB: 17-Aug-1960, 53 y.o.   MRN: [id number]  HPI Ms. Lesage is a 53yo woman pmh as listed below presents for ED follow up and HTN management.   She was recently evaluated in the ED on 09/30/13 for questionable witnessed seizures. Extensive workup including CT was negative for acute processes as pt already has previous seizure history. She was found to be sub therapeutic on her phenytoin and was loaded before going back home. Pt apparently had a positive UCx of citrobacter but was asymptomatic and not treated. Since that visit she has not had any repeat seizures and reports compliance but is not sure and her story is fluctuating even during the interview.   In terms of her BP she continues to be reporting compliance with meds and has not really changed her salt intake. She describes rapid onset of incredible weight gain, SOB, DOE, PND, inability to lay flat, and having trouble sleeping. She describes rapid weight gain for the past week especially in her abdomen and her legs without frank weeping. She continues to use large amounts of salt in her food and is increasing her sodium and fluid intake. She still reports compliance but despite this has had the weight gain that is becoming increasingly concerning to her.   After a long discussion in regards to patient personal safety and that she has reached the maximal possible benefit of her self cares that it is strongly recommended she go to a SNF as was previously agreed upon during one of her admissions. The patient is amenable and understands and will not have her other family members (sisters) deter her from this decision as they did last time.     Past Medical History  Diagnosis Date  . Seizures   . Stroke   . Meningitis   . HIV (human immunodeficiency virus infection)   . Hypertension   . Gout   . Muscle spasms of head and/or neck   . CKD (chronic kidney disease)   . CHF (congestive heart  failure)     [name] 06/18/2013  . HCV (hepatitis C virus)     chronic/notes 06/18/2013  . Type II diabetes mellitus     [name] 06/18/2013  . AIHA (autoimmune hemolytic anemia)     [name] 06/18/2013  . Hypertensive encephalopathy ~ 05/2013    hospitalaized/notes 06/18/2013  . Daily headache     "for the last 6 years/notes 06/18/2013  . Exertional shortness of breath     [name] 06/18/2013  . Anxiety     [name] 06/18/2013  . Nephrotic syndrome   . History of syphilis   . High cholesterol    Current Outpatient Prescriptions on File Prior to Visit  Medication Sig Dispense Refill  . abacavir (ZIAGEN) 300 MG tablet Take 2 tablets (600 mg total) by mouth daily.  60 tablet  6  . aspirin EC 81 MG tablet Take 1 tablet (81 mg total) by mouth daily.  30 tablet  11  . Blood Glucose Monitoring Suppl (ACCU-CHEK AVIVA PLUS) W/DEVICE KIT USE AS DIRECTED  1 kit  0  . cholecalciferol (VITAMIN D) 1000 UNITS tablet Take 1,000 Units by mouth daily.      . cloNIDine (CATAPRES) 0.2 MG tablet Take 1 tablet (0.2 mg total) by mouth 2 (two) times daily.  60 tablet  11  . cyclobenzaprine (FLEXERIL) 5 MG tablet Take 1 tablet (5 mg total) by mouth daily as needed  for muscle spasms.  10 tablet  0  . Darunavir Ethanolate (PREZISTA) 800 MG tablet Take 1 tablet (800 mg total) by mouth daily with breakfast.  30 tablet  11  . furosemide (LASIX) 40 MG tablet Take 1 tablet (40 mg total) by mouth daily.  30 tablet  1  . gentamicin (GARAMYCIN) 0.3 % ophthalmic ointment Place 1 application into both eyes 4 (four) times daily.      . hydrALAZINE (APRESOLINE) 50 MG tablet Take 1 tablet (50 mg total) by mouth 4 (four) times daily.  120 tablet  3  . hydrOXYzine (ATARAX/VISTARIL) 10 MG tablet Take 1 tablet (10 mg total) by mouth every 8 (eight) hours as needed for itching.  90 tablet  5  . insulin aspart (NOVOLOG) 100 UNIT/ML injection Inject 10 Units into the skin 3 (three) times daily before meals. Home Sliding scale  10 mL  3  .  insulin glargine (LANTUS) 100 UNIT/ML injection Inject 30 Units into the skin at bedtime.      [name] labetalol (NORMODYNE) 200 MG tablet Take 1 tablet (200 mg total) by mouth 2 (two) times daily.  60 tablet  2  . lamiVUDine (EPIVIR) 150 MG tablet Take 1 tablet (150 mg total) by mouth daily.  30 tablet  6  . levETIRAcetam (KEPPRA) 500 MG tablet Take 1 tablet (500 mg total) by mouth 2 (two) times daily.  60 tablet  2  . lisinopril (PRINIVIL,ZESTRIL) 40 MG tablet Take 1 tablet (40 mg total) by mouth daily.  90 tablet  3  . LORazepam (ATIVAN) 1 MG tablet Take 1 mg by mouth daily. Take one tablet for seizure prevention.      [name] oxyCODONE-acetaminophen (PERCOCET) 10-325 MG per tablet Take 1 tablet by mouth every 4 (four) hours as needed for pain.  20 tablet  0  . pantoprazole (PROTONIX) 40 MG tablet Take 1 tablet (40 mg total) by mouth daily.  30 tablet  11  . phenytoin (DILANTIN INFATABS) 50 MG tablet Chew 4 tablets (200 mg total) by mouth 2 (two) times daily.  240 tablet  2  . predniSONE (DELTASONE) 20 MG tablet Take 2 tablets (40 mg total) by mouth daily with breakfast.  60 tablet  0  . ritonavir (NORVIR) 100 MG TABS tablet Take 1 tablet (100 mg total) by mouth daily with breakfast.  30 tablet  11  . sodium bicarbonate 650 MG tablet Take 2 tablets (1,300 mg total) by mouth 2 (two) times daily.  60 tablet  2   No current facility-administered medications on file prior to visit.  Social, surgical, family history reviewed with patient and updated in appropriate chart locations.   Review of Systems  Constitutional: Positive for activity change (limited by SOB) and unexpected weight change (rapid weight gain in belly and legs). Negative for fever and fatigue.  Respiratory: Positive for chest tightness and shortness of breath. Negative for cough and wheezing.   Cardiovascular: Positive for leg swelling. Negative for palpitations.  Gastrointestinal: Positive for abdominal distention. Negative for nausea,  vomiting, diarrhea, constipation and blood in stool.  Genitourinary: Positive for decreased urine volume. Negative for difficulty urinating.  Neurological: Negative for dizziness, seizures, syncope, weakness and light-headedness.  Psychiatric/Behavioral: Negative for hallucinations and agitation.       Objective:   Physical Exam Filed Vitals:   10/04/13 1348  BP: 192/86  Pulse: 80  Temp: 97.5 F (36.4 C)   General: sitting in chair, NAD HEENT: PERRL, EOMI, no scleral icterus Cardiac:  RRR, no rubs, murmurs or gallops Pulm: decreased BS bilaterally, moving small volumes of air Abd: soft, nontender,very distended, BS present Ext: warm and well perfused, 3+ pitting pedal edema to knees bilaterally Neuro: alert and oriented X3, cranial nerves II-XII grossly intact    Assessment & Plan:  Please see problem oriented charting  Pt discussed with Dr. Beryle Beams

## 2013-10-04 NOTE — H&P (Signed)
Date: 10/04/2013               Patient Name:  Michelle [name] MRN: [id number]  DOB: 04/07/1961 Age / Sex: 53 y.o., female   PCP: Clinton Gallant, MD         Medical Service: Internal Medicine Teaching Service         Attending Physician: Dr. Bartholomew Crews, MD    First Contact: Dr. Gordy Levan  Pager: [phone]  Second Contact: Dr. Aundra Dubin Pager: [phone]       After Hours (After 5p/  First Contact Pager: [phone]  weekends / holidays): Second Contact Pager: [phone]   Chief Complaint: whole body swelling   History of Present Illness:   Michelle [name] is a 53 year old female with pmh of grade-2 diastolic dysfunction, FSGS, HIV (CD4 92 and VL<20 on 09/08/13), chronic HCV, seizure disorder, CVA, HTN, insulin dependent Type II DM, CKD, gout, and AIHA who presents with swelling for the past month worse in past week.   Pt reports she was doing well until about 1 month ago when she began having worsening LE swelling, progressing to her UE, abdomen, and face. She reports it worsened in the past week with about 25 lb weight gain. She has also been progressively short of breath that is present at rest with fatigue. She reports compliance with lasix 40 mg that was just recently changed to BID. She admits to following a poor diet recently and eating a lot of fast food ([employer], take-out). She reports normal urination with no recent confusion, nausea, vomiting, abdominal pain, or change in BM.     Meds: No current facility-administered medications on file prior to encounter.   Current Outpatient Prescriptions on File Prior to Encounter  Medication Sig Dispense Refill  . aspirin EC 81 MG tablet Take 1 tablet (81 mg total) by mouth daily.  30 tablet  11  . cholecalciferol (VITAMIN D) 1000 UNITS tablet Take 1,000 Units by mouth daily.      . cloNIDine (CATAPRES) 0.2 MG tablet Take 1 tablet (0.2 mg total) by mouth 2 (two) times daily.  60 tablet  11  . cyclobenzaprine (FLEXERIL) 5 MG tablet Take 1 tablet (5 mg  total) by mouth daily as needed for muscle spasms.  10 tablet  0  . Darunavir Ethanolate (PREZISTA) 800 MG tablet Take 1 tablet (800 mg total) by mouth daily with breakfast.  30 tablet  11  . gentamicin (GARAMYCIN) 0.3 % ophthalmic ointment Place 1 application into both eyes 4 (four) times daily.      . hydrALAZINE (APRESOLINE) 50 MG tablet Take 1 tablet (50 mg total) by mouth 4 (four) times daily.  120 tablet  3  . insulin glargine (LANTUS) 100 UNIT/ML injection Inject 15 Units into the skin at bedtime.       [name] labetalol (NORMODYNE) 200 MG tablet Take 1 tablet (200 mg total) by mouth 2 (two) times daily.  60 tablet  2  . lamiVUDine (EPIVIR) 150 MG tablet Take 1 tablet (150 mg total) by mouth daily.  30 tablet  6  . lisinopril (PRINIVIL,ZESTRIL) 40 MG tablet Take 1 tablet (40 mg total) by mouth daily.  90 tablet  3  . LORazepam (ATIVAN) 1 MG tablet Take 1 mg by mouth daily. Take one tablet for seizure prevention.      [name] oxyCODONE-acetaminophen (PERCOCET) 10-325 MG per tablet Take 1 tablet by mouth every 4 (four) hours as needed for pain.  20 tablet  0  .  pantoprazole (PROTONIX) 40 MG tablet Take 1 tablet (40 mg total) by mouth daily.  30 tablet  11  . phenytoin (DILANTIN INFATABS) 50 MG tablet Chew 4 tablets (200 mg total) by mouth 2 (two) times daily.  240 tablet  2  . ritonavir (NORVIR) 100 MG TABS tablet Take 1 tablet (100 mg total) by mouth daily with breakfast.  30 tablet  11  . sodium bicarbonate 650 MG tablet Take 2 tablets (1,300 mg total) by mouth 2 (two) times daily.  60 tablet  2  . Blood Glucose Monitoring Suppl (ACCU-CHEK AVIVA PLUS) W/DEVICE KIT USE AS DIRECTED  1 kit  0    No current facility-administered medications for this encounter.    Allergies: Allergies as of 10/04/2013 - Review Complete 10/04/2013  Allergen Reaction Noted  . Ceftriaxone  06/01/2013  . Norvasc [amlodipine besylate]  03/30/2013  . Morphine and related Hives, Itching, and Rash 02/15/2013   Past Medical  History  Diagnosis Date  . Seizures   . Stroke   . Meningitis   . HIV (human immunodeficiency virus infection)   . Hypertension   . Gout   . Muscle spasms of head and/or neck   . CKD (chronic kidney disease)   . CHF (congestive heart failure)     [name] 06/18/2013  . HCV (hepatitis C virus)     chronic/notes 06/18/2013  . Type II diabetes mellitus     [name] 06/18/2013  . AIHA (autoimmune hemolytic anemia)     [name] 06/18/2013  . Hypertensive encephalopathy ~ 05/2013    hospitalaized/notes 06/18/2013  . Daily headache     "for the last 6 years/notes 06/18/2013  . Exertional shortness of breath     [name] 06/18/2013  . Anxiety     [name] 06/18/2013  . Nephrotic syndrome   . History of syphilis   . High cholesterol    Past Surgical History  Procedure Laterality Date  . Hip pinning Right   . Av fistula placement Right 07/24/2013    Procedure: RIGHT arm exploration of antecubital space;  Surgeon: Elam Dutch, MD;  Location: [facility] OR;  Service: Vascular;  Laterality: Right;   Family History  Problem Relation Age of Onset  . Cancer - Colon Mother   . Cancer Father   . Hypertension Father   . Diabetes    . Diabetes Sister    History   Social History  . Marital Status: Single    Spouse Name: N/A    Number of Children: 4  . Years of Education: 11th   Occupational History  . Not on file.   Social History Main Topics  . Smoking status: Former Smoker    Types: Cigarettes    Quit date: 06/19/2010  . Smokeless tobacco: Never Used  . Alcohol Use: No  . Drug Use: No     Comment: past history of alcohol, cocaine and IV drug use  . Sexual Activity: Not Currently    Partners: Male     Comment: given condoms   Other Topics Concern  . Not on file   Social History Narrative   Patient lives at home with sister.    Patient is unemployed.    Patient is single.    Patient has 2 alive and 2 deceased.    Patient has 11th grade education.           Review of  Systems: Review of Systems  Constitutional: Positive for malaise/fatigue. Negative for fever and chills.  Weight gain  HENT: Negative for nosebleeds and sore throat.   Eyes: Negative for blurred vision.  Respiratory: Positive for cough and shortness of breath. Negative for sputum production and wheezing.   Cardiovascular: Positive for orthopnea and leg swelling. Negative for chest pain and palpitations.  Gastrointestinal: Negative for nausea, vomiting, abdominal pain, diarrhea, constipation and blood in stool.  Genitourinary: Negative for dysuria, urgency, frequency and hematuria.  Skin: Negative for rash.  Neurological: Positive for weakness. Negative for dizziness and headaches.    Physical Exam: Blood pressure 217/89, pulse 72, temperature 98.3 F (36.8 C), temperature source Oral, resp. rate 22, SpO2 99.00%. Physical Exam  Constitutional: She is oriented to person, place, and time. She appears well-developed and well-nourished. No distress.  HENT:  Head: Normocephalic and atraumatic.  Right Ear: External ear normal.  Left Ear: External ear normal.  Nose: Nose normal.  Mouth/Throat: Oropharynx is clear and moist. No oropharyngeal exudate.  Eyes: Conjunctivae and EOM are normal. Right eye exhibits no discharge. Left eye exhibits no discharge.  Neck: Normal range of motion. Neck supple.  Cardiovascular: Normal rate and regular rhythm.   Pulmonary/Chest: Effort normal. No respiratory distress. She has no wheezes. She has rales.  Abdominal: Soft. Bowel sounds are normal. She exhibits distension. There is no tenderness. There is no rebound and no guarding.  Musculoskeletal: Normal range of motion. She exhibits edema (+3 LE up to thighs and UE edema with facial swelling). She exhibits no tenderness.  Neurological: She is alert and oriented to person, place, and time. No cranial nerve deficit.  Skin: Skin is warm and dry. No rash noted. She is not diaphoretic. No erythema. No pallor.   Psychiatric: She has a normal mood and affect. Her behavior is normal. Judgment and thought content normal.     Lab results: Basic Metabolic Panel:  Recent Labs  10/04/13 1440  NA 143  K 3.5  CL 106  CO2 21  GLUCOSE 169*  BUN 38*  CREATININE 1.85*  CALCIUM 7.2*   Liver Function Tests:  Recent Labs  10/04/13 1440  AST 61*  ALT 79*  ALKPHOS 272*  BILITOT 0.3  PROT 6.3  ALBUMIN 2.2*   CBC:  Recent Labs  10/04/13 1440  WBC 4.3  NEUTROABS 3.6  HGB 8.0*  HCT 24.7*  MCV 108.8*  PLT 160   Cardiac Enzymes:  Recent Labs  10/04/13 1440  TROPONINI <0.30   BNP:  Recent Labs  10/04/13 1440  PROBNP 1277*   CBG:  Recent Labs  10/04/13 1402 10/04/13 1557  GLUCAP 185* 178*   Urine Drug Screen: Drugs of Abuse     Component Value Date/Time   LABOPIA NONE DETECTED 09/30/2013 0645   COCAINSCRNUR NONE DETECTED 09/30/2013 0645   LABBENZ NONE DETECTED 09/30/2013 0645   AMPHETMU NONE DETECTED 09/30/2013 0645   THCU NONE DETECTED 09/30/2013 0645   LABBARB NONE DETECTED 09/30/2013 0645     Imaging results:  No results found.  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 Albumin of 2.2 with proteinuria (>300) -IV lasix 80 BID  -Monitor daily weight -Strict I & O's -Obtain UA -2 g salted restricted diet  -F/U CXR  -Consider nephrology consult   Possible Acute on Chronic Grade 2 Diastolic CHF - Last 2D-echo on 05/31/13 with normal EF, grade 2 diastolic CHF, with  mild concentric hypertrophy. Pro-BNP elevated at 1277.  -F/U 2 view CXR     -Monitor volume status   Hypertension -Currently hypertensive. Blood pressure 217/89 on admission.  -Continue lisinopril 40 mg daily -Continue home clonidine 0.2 mg BID -Continue home labetalol 200 mg BID  -Continue home hydralazine 50 mg x 4 daily    Seizure Disorder - Last seizure activity reported 5/4 seen in ED with sub therapeutic  dilantin levels.  -F/U keppra and dilantin levels  -Continue home keppra 500 mg BID -Continue home dilantin 200 mg BID  -Continue home ativan 1 mg daily -PT/OT consults  CKD Stage 3 - Renal function near baseline Cr 2.  Pt with FSGS and diabetic glomerulosclerosis.  -Continue home bicarbonate 1300 mg BID  -Continue home vitamin D 1000 U daily -Monitor BMP  -Avoid nephrotoxins   HIV Infection -  Last CD4 280 and VL<20 on 09/08/13. -Continue home epivir 150 mg daily -Continue home prezista 800 mg daily -Continue norvir 100 mg daily   -Continue abacavir 300 mg BID   Insulin-dependent Type II DM - Last A1c of  5 on 05/14/13.  Pt at home on novolog 10-25 U with meals -Lantus 15 U daily -Monitor CBG before meals & at bedtime  Chronic Hemolytic Anemia - Hg 8 on admission near baseline with no active bleeding or hemodynamic instability.  -Continue home prednisone 40 mg daily  -Monitor CBC -Transfuse Hg if < 7   Chronic Hepatitis C Infection - Pt with LFT's on admission near baseline.  -Avoid hepatoxic agents  History of CVA - Currently with no neurological symptoms -Continue home 81 mg daily aspirin   Diet: 2 g salt restricted  DVT Ppx: SQ Heparin Code: Full  Dispo: Disposition is deferred at this time, awaiting improvement of current medical problems. Anticipated discharge in approximately 3-5 day(s).   The patient does have a current PCP Clinton Gallant, MD) and does need an [facility] hospital follow-up appointment after discharge.  The patient does have transportation limitations that hinder transportation to clinic appointments.  Signed: Juluis Mire, MD 10/04/2013, 5:19 PM

## 2013-10-04 NOTE — Progress Notes (Signed)
Admission note:  Arrival Method: wheelchair Mental Orientation: alert and oriented  Telemetry: NA  Assessment: completed  Skin: healing abrasion to right shin area  IV: Will call IV team when order placed for IV access d/t difficulty stick  Pain: pt denies  Tubes: none Safety Measures: Patient Handbook has been given, and discussed the Fall Prevention worksheet. Left at bedside  Admission: Completed. Waiting on doctor  to write admission orders  6E Orientation: Patient has been oriented to the unit, staff and to the room.

## 2013-10-04 NOTE — Progress Notes (Signed)
Attending physician note: Presenting symptoms, physical findings, discussed with resident physician Dr. Christen Bame. Woman with nephrotic syndrome, advanced renal disease with creatinine 4, followed closely by Dr. Burtis Junes .  patient has gained 20 pounds in one week. Admission to the hospital indicated for diuresis. Cephas Darby, M.D., FACP

## 2013-10-04 NOTE — Assessment & Plan Note (Addendum)
BP Readings from Last 3 Encounters:  10/04/13 192/86  09/30/13 169/76  09/27/13 177/86   Wt Readings from Last 3 Encounters:  10/04/13 221 lb 14.4 oz (100.653 kg)  09/27/13 210 lb 12.8 oz (95.618 kg)  09/26/13 206 lb (93.441 kg)   Pt continues to have increased BP readings and most suspect culprit is complicated medical regiment with low health and medical literacy compounded by poor baseline mentation in setting of psychiatric disorders. Today pt presents with significant weight gain (20+ lbs) in only 7 days and is symptomatic. Therefore pt was admitted for diuresis. Extensive discussion regarding importance and need for Snf was had for the patient and she is agreeable and amenable to going to SNF at discharge.  -cmet, cbc, trop, bnp all drawn in clinic   -

## 2013-10-04 NOTE — Telephone Encounter (Signed)
Post ED Visit - Positive Culture Follow-up  Culture report reviewed by antimicrobial stewardship pharmacist: []  Wes Dulaney, Pharm.D., BCPS [x]  Celedonio Miyamoto, Pharm.D., BCPS []  Georgina Pillion, Pharm.D., BCPS []  [place], [address].D., BCPS, AAHIVP []  Estella Husk, Pharm.D., BCPS, AAHIVP []  Harvie Junior, Pharm.D.  Positive Urine culture No treatment needed.  No further patient follow-up is required at this time.  Arvid Right 10/04/2013, 8:51 AM

## 2013-10-04 NOTE — Progress Notes (Signed)
Report called to [place], RN on [address].  Pt transported via wheelchair to [address].  Angelina Ok, RN 10/04/2013 3:40 PM.

## 2013-10-04 NOTE — Assessment & Plan Note (Signed)
Pt recently seen for presumed witnessed seizure activity (this was not validated as no family whom had originally claimed to witness came to the hospital or spoke to ED physicians). There is mounting evidence that the patient although appears committed to improving life can not comprehend and safely self administer medications. The patients medical condition has not improved even with the introduction of PCS and her other family members who have stated they are monitoring and help the patient.  -keppra and dilantin levels -neuro follow up

## 2013-10-05 ENCOUNTER — Other Ambulatory Visit: Payer: Self-pay

## 2013-10-05 DIAGNOSIS — E8779 Other fluid overload: Secondary | ICD-10-CM

## 2013-10-05 DIAGNOSIS — N189 Chronic kidney disease, unspecified: Secondary | ICD-10-CM

## 2013-10-05 LAB — CBC WITH DIFFERENTIAL/PLATELET
BASOS PCT: 0 % (ref 0–1)
Basophils Absolute: 0 10*3/uL (ref 0.0–0.1)
EOS ABS: 0 10*3/uL (ref 0.0–0.7)
Eosinophils Relative: 0 % (ref 0–5)
HCT: 17.7 % — ABNORMAL LOW (ref 36.0–46.0)
HEMOGLOBIN: 5.8 g/dL — AB (ref 12.0–15.0)
Lymphocytes Relative: 29 % (ref 12–46)
Lymphs Abs: 1.7 10*3/uL (ref 0.7–4.0)
MCH: 36.3 pg — AB (ref 26.0–34.0)
MCHC: 32.8 g/dL (ref 30.0–36.0)
MCV: 110.6 fL — AB (ref 78.0–100.0)
MONO ABS: 0.7 10*3/uL (ref 0.1–1.0)
Monocytes Relative: 12 % (ref 3–12)
Neutro Abs: 3.5 10*3/uL (ref 1.7–7.7)
Neutrophils Relative %: 59 % (ref 43–77)
PLATELETS: 170 10*3/uL (ref 150–400)
RBC: 1.6 MIL/uL — AB (ref 3.87–5.11)
RDW: 13.8 % (ref 11.5–15.5)
WBC: 5.9 10*3/uL (ref 4.0–10.5)

## 2013-10-05 LAB — BASIC METABOLIC PANEL
BUN: 40 mg/dL — AB (ref 6–23)
CO2: 22 mEq/L (ref 19–32)
CREATININE: 1.93 mg/dL — AB (ref 0.50–1.10)
Calcium: 7.1 mg/dL — ABNORMAL LOW (ref 8.4–10.5)
Chloride: 106 mEq/L (ref 96–112)
GFR, EST AFRICAN AMERICAN: 33 mL/min — AB (ref >90)
GFR, EST NON AFRICAN AMERICAN: 28 mL/min — AB (ref >90)
Glucose, Bld: 139 mg/dL — ABNORMAL HIGH (ref 70–99)
Potassium: 3.5 mEq/L — ABNORMAL LOW (ref 3.7–5.3)
Sodium: 143 mEq/L (ref 137–147)

## 2013-10-05 LAB — GLUCOSE, CAPILLARY
GLUCOSE-CAPILLARY: 227 mg/dL — AB (ref 70–99)
GLUCOSE-CAPILLARY: 422 mg/dL — AB (ref 70–99)
Glucose-Capillary: 146 mg/dL — ABNORMAL HIGH (ref 70–99)
Glucose-Capillary: 227 mg/dL — ABNORMAL HIGH (ref 70–99)
Glucose-Capillary: 418 mg/dL — ABNORMAL HIGH (ref 70–99)
Glucose-Capillary: 368 mg/dL — ABNORMAL HIGH (ref 70–99)

## 2013-10-05 LAB — CBC
HCT: 24.2 % — ABNORMAL LOW (ref 36.0–46.0)
Hemoglobin: 7.9 g/dL — ABNORMAL LOW (ref 12.0–15.0)
MCH: 36.2 pg — AB (ref 26.0–34.0)
MCHC: 32.6 g/dL (ref 30.0–36.0)
MCV: 111 fL — AB (ref 78.0–100.0)
Platelets: 141 10*3/uL — ABNORMAL LOW (ref 150–400)
RBC: 2.18 MIL/uL — ABNORMAL LOW (ref 3.87–5.11)
RDW: 13.6 % (ref 11.5–15.5)
WBC: 4.8 10*3/uL (ref 4.0–10.5)

## 2013-10-05 LAB — MAGNESIUM: Magnesium: 1.5 mg/dL (ref 1.5–2.5)

## 2013-10-05 MED ORDER — LABETALOL HCL 200 MG PO TABS
400.0000 mg | ORAL_TABLET | Freq: Two times a day (BID) | ORAL | Status: DC
Start: 2013-10-05 — End: 2013-10-12
  Administered 2013-10-05 – 2013-10-12 (×15): 400 mg via ORAL
  Filled 2013-10-05 (×17): qty 2

## 2013-10-05 MED ORDER — POTASSIUM CHLORIDE CRYS ER 20 MEQ PO TBCR
20.0000 meq | EXTENDED_RELEASE_TABLET | Freq: Once | ORAL | Status: AC
  Administered 2013-10-05: 20 meq via ORAL
  Filled 2013-10-05: qty 1

## 2013-10-05 MED ORDER — CLONIDINE HCL 0.2 MG PO TABS
0.2000 mg | ORAL_TABLET | Freq: Two times a day (BID) | ORAL | Status: DC
  Administered 2013-10-05 (×2): 0.2 mg via ORAL
  Filled 2013-10-05 (×4): qty 1

## 2013-10-05 MED ORDER — INSULIN ASPART 100 UNIT/ML ~~LOC~~ SOLN
0.0000 [IU] | Freq: Three times a day (TID) | SUBCUTANEOUS | Status: DC
Start: 2013-10-05 — End: 2013-10-05
  Administered 2013-10-05: 15 [IU] via SUBCUTANEOUS

## 2013-10-05 MED ORDER — INSULIN ASPART 100 UNIT/ML ~~LOC~~ SOLN
0.0000 [IU] | Freq: Three times a day (TID) | SUBCUTANEOUS | Status: DC
  Administered 2013-10-06: 7 [IU] via SUBCUTANEOUS
  Administered 2013-10-06: 3 [IU] via SUBCUTANEOUS
  Administered 2013-10-06: 15 [IU] via SUBCUTANEOUS
  Administered 2013-10-07: 7 [IU] via SUBCUTANEOUS
  Administered 2013-10-07: 20 [IU] via SUBCUTANEOUS
  Administered 2013-10-07: 3 [IU] via SUBCUTANEOUS
  Administered 2013-10-08: 11 [IU] via SUBCUTANEOUS
  Administered 2013-10-08: 4 [IU] via SUBCUTANEOUS
  Administered 2013-10-08: 7 [IU] via SUBCUTANEOUS
  Administered 2013-10-09 (×2): 4 [IU] via SUBCUTANEOUS
  Administered 2013-10-09: 11 [IU] via SUBCUTANEOUS
  Administered 2013-10-10: 8 [IU] via SUBCUTANEOUS
  Administered 2013-10-10 – 2013-10-11 (×3): 7 [IU] via SUBCUTANEOUS
  Administered 2013-10-11: 3 [IU] via SUBCUTANEOUS
  Administered 2013-10-12: 7 [IU] via SUBCUTANEOUS
  Administered 2013-10-12 (×2): 4 [IU] via SUBCUTANEOUS
  Administered 2013-10-13: 7 [IU] via SUBCUTANEOUS
  Administered 2013-10-13: 3 [IU] via SUBCUTANEOUS
  Administered 2013-10-13: 7 [IU] via SUBCUTANEOUS
  Administered 2013-10-14: 20 [IU] via SUBCUTANEOUS
  Administered 2013-10-14: 7 [IU] via SUBCUTANEOUS

## 2013-10-05 MED ORDER — CLONIDINE HCL 0.2 MG PO TABS: 0.2000 mg | ORAL_TABLET | Freq: Three times a day (TID) | ORAL | Status: DC

## 2013-10-05 MED ORDER — INSULIN GLARGINE 100 UNIT/ML ~~LOC~~ SOLN
16.0000 [IU] | Freq: Every day | SUBCUTANEOUS | Status: DC
  Administered 2013-10-05 – 2013-10-06 (×2): 16 [IU] via SUBCUTANEOUS
  Filled 2013-10-05 (×2): qty 0.16

## 2013-10-05 NOTE — Evaluation (Signed)
Physical Therapy Evaluation Patient Details Name: Michelle [name] MRN: [id number] DOB: 1961-03-07 Today's Date: 10/05/2013   History of Present Illness  Pt is a 53 y/o female who presented to the ED on 10/04/13 with fluid overload, 25 lb weight gain since 4/24, LE edema, and sob at rest. Complex PMH including seizures, stroke, HIV.  Clinical Impression  Patient evaluated by Physical Therapy with no further acute PT needs identified. Pt states she is at her baseline of function and does not wish for continued PT services. All education has been completed and the patient has no further questions. See below for any follow-up Physial Therapy or equipment needs. PT is signing off. Thank you for this referral.     Follow Up Recommendations No PT follow up    Equipment Recommendations  None recommended by PT    Recommendations for Other Services       Precautions / Restrictions Precautions Precautions: Fall Restrictions Weight Bearing Restrictions: No      Mobility  Bed Mobility Overal bed mobility: Modified Independent             General bed mobility comments: Pt able to transition to EOB without physical assist.   Transfers Overall transfer level: Modified independent Equipment used: None             General transfer comment: Pt able to perform sit<>stand without assist and shows proper hand placement.   Ambulation/Gait Ambulation/Gait assistance: Modified independent (Device/Increase time) Ambulation Distance (Feet): 150 Feet Assistive device: None Gait Pattern/deviations: Wide base of support;Decreased stride length Gait velocity: Decreased Gait velocity interpretation: Below normal speed for age/gender General Gait Details: Pt insisted on ambulating with her ice-pop as she did not want to put it down. Pt was able to ambulate in the hallway and eat her ice-pop without balance disturbance. Pt states she is at her baseline of function.   Stairs             Wheelchair Mobility    Modified Rankin (Stroke Patients Only)       Balance Overall balance assessment: No apparent balance deficits (not formally assessed)                                           Pertinent Vitals/Pain Pt reports no pain or SOB throughout session.     Home Living Family/patient expects to be discharged to:: Private residence Living Arrangements: Alone Available Help at Discharge: Family;Available PRN/intermittently Type of Home: Apartment Home Access: Level entry     Home Layout: One level Home Equipment: Cane - single point;Grab bars - tub/shower;Grab bars - toilet;Walker - standard;Walker - 2 wheels;Toilet riser Additional Comments: cousin comes in every day about 4 hours to assist and hopes to get paid as PCA. Sister about to move in with her when she gets a 2 bedroom apartment.    Prior Function Level of Independence: Independent with assistive device(s)         Comments: Has not used cane in 2 weeks, but feels like she may have to go back to it if swelling doesnt come back down     Hand Dominance   Dominant Hand: Right    Extremity/Trunk Assessment   Upper Extremity Assessment: Overall WFL for tasks assessed           Lower Extremity Assessment: Overall WFL for tasks assessed  Cervical / Trunk Assessment: Normal  Communication   Communication: No difficulties  Cognition Arousal/Alertness: Awake/alert Behavior During Therapy: WFL for tasks assessed/performed Overall Cognitive Status: Within Functional Limits for tasks assessed                      General Comments      Exercises        Assessment/Plan    PT Assessment Patent does not need any further PT services  PT Diagnosis     PT Problem List    PT Treatment Interventions     PT Goals (Current goals can be found in the Care Plan section) Acute Rehab PT Goals Patient Stated Goal: To eat a double cheeseburger. PT Goal Formulation:  No goals set, d/c therapy    Frequency     Barriers to discharge        Co-evaluation               End of Session Equipment Utilized During Treatment: Gait belt Activity Tolerance: Patient tolerated treatment well Patient left: in chair;with call bell/phone within reach Nurse Communication: Mobility status         Time: 1431-1455 PT Time Calculation (min): 24 min   Charges:   PT Evaluation $Initial PT Evaluation Tier I: 1 Procedure PT Treatments $Therapeutic Activity: 8-22 mins   PT G CodesRuthann Cancer:          Frans Valente Hamilton 10/05/2013, 4:15 PM  Ruthann CancerLaura Hamilton, PT, DPT Acute Rehabilitation Services Pager: [phone]

## 2013-10-05 NOTE — H&P (Signed)
Pt seen and examined. 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 Patient reports non compliance with diet and has been eating a lot of salt in her diet. She states she noticed LE swelling which gradually spread to her face and Upper Extremities as well. She also c/o associated SOB and dyspnea on exertion. Pt states she is now SOB with minimal exertion. Denies CP, no abd pain, no diarrhea, no fevers, no cough, no HA, no blurry vision, no tingling/numbness, no focal weakness. She came to ED for further eval. Patient also states her blood pressure has been running high at home. Remaining ROS negative  Cardiovascular- normal heart sounds, regular rate and rhythm Lungs- bibasilar crackles + Abdomen- obese, non tender, bowel sounds + Gen- AAO*3, NAD Extremities- 2 + b/l LE edema  Acute on chronic diastolic CHF exacerbation - Continue with IV lasix for now - Strict I's and O's, daly weights - Would check repeat EKG in AM - troponin negative on admission  FSGS, CKD - Creatinine near baseline. Will monitor - Pt with proteinuria and low albumin which is possibly contributing to fluid overload - If worsening will consider nephrology consult  Accelerated HTN - Pt with uncontrolled HTN. - Continue with lisinopril, hydralazine, clonidine at current doses - Increase labetolol to 400 mg 2 * day. Continue with IV lasix - BP likely to improve with diuresis as patient is volume overloaded - If continues to remain elevated would increase clonidine to 0.3 bid or add prazosin  Seizure disorder - continue with current meds - follow up Keppra and dilantin levels  HIV - Resume HAART meds and will need outpatient follow up  Chronic hemolytic  anemia - continue with prednisone and monitor Hg - consider transfuse for Hg <7  Diabetes - continue with lantus for now - would add premeal if CBG remain elevated  Case d/w Dr. Johna Roles and patient in detail

## 2013-10-05 NOTE — Progress Notes (Signed)
Critical Hgb called of 5.8 by lab. Dr.McLean notified. New orders placed.

## 2013-10-05 NOTE — Progress Notes (Signed)
Subjective: Patient breathing better, states leg edema better. She wants to eat more food.    Objective: Vital signs in last 24 hours: Filed Vitals:   10/04/13 2059 10/05/13 0531 10/05/13 0900 10/05/13 1300  BP: 162/80 213/97 175/75 167/83  Pulse: 82 75 83 87  Temp:  98.7 F (37.1 C) 98 F (36.7 C)   TempSrc:  Oral Oral   Resp:  20 21 22   Height:      Weight:      SpO2:  97% 97% 100%   Weight change:   Intake/Output Summary (Last 24 hours) at 10/05/13 1442 Last data filed at 10/05/13 1300  Gross per 24 hour  Intake    603 ml  Output      0 ml  Net    603 ml   Vitals reviewed. General: resting in bed, NAD HEENT:[place]/at, no scleral icterus Cardiac: RRR, no rubs, murmurs or gallops Pulm: mild crackles on exam  Abd: soft, nontender, nondistended, BS present Ext: warm and well perfused, 2+ edema b/l lower ext  Neuro: alert and oriented X3, cranial nerves II-XII grossly intact, moving all 4 extremities   Lab Results: Basic Metabolic Panel:  Recent Labs Lab 10/04/13 1440 10/05/13 0800  NA 143 143  K 3.5 3.5*  CL 106 106  CO2 21 22  GLUCOSE 169* 139*  BUN 38* 40*  CREATININE 1.85* 1.93*  CALCIUM 7.2* 7.1*  MG  --  1.5   Liver Function Tests:  Recent Labs Lab 09/30/13 0501 10/04/13 1440  AST 50* 61*  ALT 67* 79*  ALKPHOS 212* 272*  BILITOT 0.3 0.3  PROT 5.8* 6.3  ALBUMIN 2.0* 2.2*    Recent Labs Lab 09/30/13 0501  AMMONIA 58   CBC:  Recent Labs Lab 10/04/13 1440 10/05/13 0800 10/05/13 1010  WBC 4.3 5.9 4.8  NEUTROABS 3.6 3.5  --   HGB 8.0* 5.8* 7.9*  HCT 24.7* 17.7* 24.2*  MCV 108.8* 110.6* 111.0*  PLT 160 170 141*   Cardiac Enzymes:  Recent Labs Lab 10/04/13 1440  TROPONINI <0.30   BNP:  Recent Labs Lab 10/04/13 1440  PROBNP 1277*   CBG:  Recent Labs Lab 09/30/13 0309 10/04/13 1402 10/04/13 1557 10/04/13 2049 10/05/13 0803 10/05/13 1147  GLUCAP 113* 185* 178* 227* 146* 227*     Alcohol Level:  Recent  Labs Lab 09/30/13 0501  ETH <11   Urinalysis:  Recent Labs Lab 09/30/13 0644 10/04/13 2116  COLORURINE YELLOW YELLOW  LABSPEC 1.021 1.019  PHURINE 6.5 6.0  GLUCOSEU NEGATIVE 100*  HGBUR TRACE* NEGATIVE  BILIRUBINUR NEGATIVE NEGATIVE  KETONESUR NEGATIVE NEGATIVE  PROTEINUR >300* >300*  UROBILINOGEN 1.0 1.0  NITRITE NEGATIVE NEGATIVE  LEUKOCYTESUR SMALL* TRACE*   Misc. Labs: none  Micro Results: Recent Results (from the past 240 hour(s))  CULTURE, BLOOD (ROUTINE X 2)     Status: None   Collection Time    09/30/13  4:25 AM      Result Value Ref Range Status   Specimen Description BLOOD HAND LEFT   Final   Special Requests BOTTLES DRAWN AEROBIC ONLY 10CC   Final   Culture  Setup Time     Final   Value: 09/30/2013 09:37     Performed at [facility]   Culture     Final   Value:        BLOOD CULTURE RECEIVED NO GROWTH TO DATE CULTURE WILL BE HELD FOR 5 DAYS BEFORE ISSUING A FINAL NEGATIVE REPORT  Performed at [facility]   Report Status PENDING   Incomplete  URINE CULTURE     Status: None   Collection Time    09/30/13  6:45 AM      Result Value Ref Range Status   Specimen Description URINE, CLEAN CATCH   Final   Special Requests Immunocompromised   Final   Culture  Setup Time     Final   Value: 09/30/2013 12:56     Performed at [facility] Count     Final   Value: >=100,000 COLONIES/ML     Performed at [facility]   Culture     Final   Value: [place]     Performed at [facility]   Report Status 10/02/2013 FINAL   Final   Organism ID, Bacteria CITROBACTER BRAAKII   Final   Studies/Results: X-[facility] Chest Pa And Lateral   10/05/2013   CLINICAL DATA:  Fluid over fluid, short of breath  EXAM: CHEST  2 VIEW  COMPARISON:  Prior chest x-[facility] 07/19/2013  FINDINGS: Stable cardiac and mediastinal contours. Linear atelectasis versus scarring in the lingula slightly more prominent compared to prior. There is  pulmonary vascular congestion with cephalization and mild interstitial edema. Nonspecific bibasilar opacities are favored to reflect a combination of dependent edema and subsegmental atelectasis. No pleural effusion or pneumothorax. No acute osseous abnormality.  IMPRESSION: 1. Pulmonary vascular congestion and mild interstitial edema consistent with the clinical history of volume overload. 2. Nonspecific bibasilar patchy airspace opacities favored to reflect a combination of dependent edema and atelectasis. Superimposed infiltrate is difficult to exclude entirely.   Electronically Signed   By: Malachy Moan M.D.   On: 10/05/2013 08:55   Medications: Scheduled Meds: . abacavir  300 mg Oral BID  . aspirin EC  81 mg Oral Daily  . cholecalciferol  1,000 Units Oral Daily  . cloNIDine  0.2 mg Oral BID  . darunavir  800 mg Oral Q breakfast  . furosemide  80 mg Intravenous BID  . heparin  5,000 Units Subcutaneous 3 times per day  . hydrALAZINE  50 mg Oral QID  . insulin glargine  15 Units Subcutaneous QHS  . labetalol  400 mg Oral BID  . lamiVUDine  150 mg Oral Daily  . levETIRAcetam  500 mg Oral BID  . lisinopril  40 mg Oral Daily  . LORazepam  1 mg Oral Daily  . pantoprazole  40 mg Oral Daily  . phenytoin  200 mg Oral BID  . potassium chloride  20 mEq Oral Once  . predniSONE  40 mg Oral Q breakfast  . ritonavir  100 mg Oral Q breakfast  . sodium bicarbonate  1,300 mg Oral BID  . sodium chloride  3 mL Intravenous Q12H   Continuous Infusions:  PRN Meds:.cyclobenzaprine, hydrOXYzine, oxyCODONE, oxyCODONE-acetaminophen Assessment/Plan: 53 y.o PMH seizures, stroke, HIV, HCV, pulmonary HTN, uncontrolled HTN, CKD, collapsing FSGS with DM GS, DM2, AIHA, anxiety, nephrotic syndrome, h/o RPR, HLD.  She presented to clinic 5/8 with fluid overload, 25 lb weight gain since 4/24, 3+ leg edema b/l and sob at rest.   #Anasarca  -Etiology multifactorial likely 2/2 diet noncompliance, acute on chronic  diastolic grade 2 CHF, CKD, nephrotic syndrome, collapsing FSGS with DM GS, low, low Albumin (2.2), uncontrolled HTN leading to on CXR pulmonary vascular congestion and mild interstitial edema. Nonspecific bibasilar patchy airspace opacities dependent edema and atelactasis ? Superimposed infiltrate.   -will continue Lasix  80 mg iv bid with improved of lower ext edema  -strict i/o, daily weights   #Unspecified essential hypertension -Uncontrolled HTN -Increased Labetalol 200 mg bid to 400 mg bid, continue Clonidine 0.2 bid, Lasix 80 iv bid, Hydralazine 50 mg qid, Lisinopril 40 mg qd  #HIV disease -CD4 280 09/08/13, VL <20 -resumed home meds according to Dr. Feliz Beam note 09/26/13.  -will confirm these are correct medications by discharge    #CKD (chronic kidney disease) stage 3, GFR 30-59 ml/min -trend BMET Cr. Trending sl up 1.93 today  BL close to 2   #Diabetes -monitor cbg, Lantus 15 qhs, SSI   #Autoimmune hemolytic anemia -Continue Prednisone -Monitor CBC. CBC 5.8 trended up to 7.9 on repeat   #Seizure  -continue home meds -seizure precautions  #Unspecified protein-calorie malnutrition  #Asymptomatic bacturia (Citrobactor noted 5/4) -will not treat   #F/E/N -NSL -Hypokalemia 3.5 today given Kdur 20 x 1  -2 gram diet   #DVT px  -Heparin sq    Dispo: Disposition is deferred at this time, awaiting improvement of current medical problems.  Anticipated discharge in approximately 2-3 day(s).   The patient does have a current PCP Christen Bame, MD) and does need an [facility] hospital follow-up appointment after discharge.  The patient does not have transportation limitations that hinder transportation to clinic appointments.  .Services Needed at time of discharge: Y = Yes, Blank = No PT:   OT:   RN:   Equipment:   Other:     LOS: 1 day   Annett Gula, MD [phone] 10/05/2013, 2:42 PM

## 2013-10-06 DIAGNOSIS — D591 Autoimmune hemolytic anemia, unspecified: Secondary | ICD-10-CM

## 2013-10-06 DIAGNOSIS — E876 Hypokalemia: Secondary | ICD-10-CM | POA: Diagnosis present

## 2013-10-06 DIAGNOSIS — B20 Human immunodeficiency virus [HIV] disease: Secondary | ICD-10-CM

## 2013-10-06 DIAGNOSIS — E46 Unspecified protein-calorie malnutrition: Secondary | ICD-10-CM

## 2013-10-06 DIAGNOSIS — R569 Unspecified convulsions: Secondary | ICD-10-CM

## 2013-10-06 LAB — CULTURE, BLOOD (ROUTINE X 2): CULTURE: NO GROWTH

## 2013-10-06 LAB — CBC
HCT: 23.9 % — ABNORMAL LOW (ref 36.0–46.0)
HEMOGLOBIN: 7.8 g/dL — AB (ref 12.0–15.0)
MCH: 35.9 pg — AB (ref 26.0–34.0)
MCHC: 32.6 g/dL (ref 30.0–36.0)
MCV: 110.1 fL — AB (ref 78.0–100.0)
Platelets: 140 10*3/uL — ABNORMAL LOW (ref 150–400)
RBC: 2.17 MIL/uL — ABNORMAL LOW (ref 3.87–5.11)
RDW: 13.6 % (ref 11.5–15.5)
WBC: 4.4 10*3/uL (ref 4.0–10.5)

## 2013-10-06 LAB — BASIC METABOLIC PANEL
BUN: 42 mg/dL — ABNORMAL HIGH (ref 6–23)
CO2: 23 meq/L (ref 19–32)
Calcium: 7 mg/dL — ABNORMAL LOW (ref 8.4–10.5)
Chloride: 106 mEq/L (ref 96–112)
Creatinine, Ser: 1.99 mg/dL — ABNORMAL HIGH (ref 0.50–1.10)
GFR calc Af Amer: 32 mL/min — ABNORMAL LOW (ref >90)
GFR calc non Af Amer: 27 mL/min — ABNORMAL LOW (ref >90)
GLUCOSE: 173 mg/dL — AB (ref 70–99)
POTASSIUM: 3.6 meq/L — AB (ref 3.7–5.3)
Sodium: 143 mEq/L (ref 137–147)

## 2013-10-06 LAB — GLUCOSE, CAPILLARY
GLUCOSE-CAPILLARY: 121 mg/dL — AB (ref 70–99)
GLUCOSE-CAPILLARY: 228 mg/dL — AB (ref 70–99)
GLUCOSE-CAPILLARY: 451 mg/dL — AB (ref 70–99)
Glucose-Capillary: 340 mg/dL — ABNORMAL HIGH (ref 70–99)

## 2013-10-06 MED ORDER — LEVETIRACETAM 750 MG PO TABS
1500.0000 mg | ORAL_TABLET | Freq: Two times a day (BID) | ORAL | Status: DC
  Administered 2013-10-06 – 2013-10-10 (×9): 1500 mg via ORAL
  Filled 2013-10-06 (×10): qty 2

## 2013-10-06 MED ORDER — FUROSEMIDE 10 MG/ML IJ SOLN: 60.0000 mg | Freq: Two times a day (BID) | INTRAMUSCULAR | Status: DC

## 2013-10-06 MED ORDER — PHENYTOIN SODIUM EXTENDED 100 MG PO CAPS
100.0000 mg | ORAL_CAPSULE | Freq: Three times a day (TID) | ORAL | Status: DC
  Administered 2013-10-06 – 2013-10-10 (×13): 100 mg via ORAL
  Filled 2013-10-06 (×16): qty 1

## 2013-10-06 MED ORDER — CLONIDINE HCL 0.3 MG PO TABS
0.3000 mg | ORAL_TABLET | Freq: Two times a day (BID) | ORAL | Status: DC
  Administered 2013-10-06 – 2013-10-09 (×7): 0.3 mg via ORAL
  Filled 2013-10-06 (×9): qty 1

## 2013-10-06 MED ORDER — FUROSEMIDE 10 MG/ML IJ SOLN
80.0000 mg | Freq: Two times a day (BID) | INTRAMUSCULAR | Status: AC
  Administered 2013-10-06: 80 mg via INTRAVENOUS

## 2013-10-06 MED ORDER — CLONIDINE HCL 0.2 MG PO TABS
0.2000 mg | ORAL_TABLET | Freq: Three times a day (TID) | ORAL | Status: DC
  Filled 2013-10-06 (×3): qty 1

## 2013-10-06 MED ORDER — INSULIN ASPART 100 UNIT/ML ~~LOC~~ SOLN
12.0000 [IU] | Freq: Once | SUBCUTANEOUS | Status: AC
  Administered 2013-10-06: 12 [IU] via SUBCUTANEOUS

## 2013-10-06 MED ORDER — CLONIDINE HCL 0.3 MG PO TABS
0.3000 mg | ORAL_TABLET | Freq: Three times a day (TID) | ORAL | Status: DC
  Filled 2013-10-06 (×3): qty 1

## 2013-10-06 MED ORDER — POTASSIUM CHLORIDE CRYS ER 20 MEQ PO TBCR
30.0000 meq | EXTENDED_RELEASE_TABLET | Freq: Once | ORAL | Status: AC
  Administered 2013-10-06: 30 meq via ORAL
  Filled 2013-10-06: qty 1

## 2013-10-06 MED ORDER — POTASSIUM CHLORIDE CRYS ER 20 MEQ PO TBCR: 20.0000 meq | EXTENDED_RELEASE_TABLET | Freq: Once | ORAL | Status: DC

## 2013-10-06 NOTE — Progress Notes (Addendum)
Subjective:  Patient sitting up in bed eating breakfast in good spirits.  VSS; saturating 100% on RA.  BP is elevated but this is a chronic issue.  She reports improvement of LE swelling and improvement in SOB.  She reports increased urine output.  She denies any CP, N/V, abdominal pain.  She has been eating well and wants "more to eat."  She reports that she is in the process of moving in with her sister to a 2 bedroom apartment soon.     I's/O's and weights are not being properly documented.    Objective:  Vital signs in last 24 hours: Filed Vitals:   10/05/13 1300 10/05/13 2052 10/06/13 0600 10/06/13 0927  BP: 167/83 199/90 186/87   Pulse: 87 90 81   Temp:  98.7 F (37.1 C) 98.3 F (36.8 C)   TempSrc:  Oral Oral   Resp: 22 20 19    Height:  5\' 5"  (1.651 m)    Weight:    221 lb 9 oz (100.5 kg)  SpO2: 100% 100% 99%    Weight change:   Intake/Output Summary (Last 24 hours) at 10/06/13 0934 Last data filed at 10/05/13 2053  Gross per 24 hour  Intake    480 ml  Output    450 ml  Net     30 ml   Physical Exam: Vitals reviewed. General: sitting up in bed eating breakfast, NAD HEENT:[place]/AT, EOMI, no scleral icterus Cardiac: RRR, no rubs, murmurs or gallops Pulm: normal work of breathing, mild basilar crackles on exam  Abd: obese; soft, nontender, nondistended, BS present Ext: warm and well perfused, 2+ edema b/l LE to knees Neuro: alert and oriented X3, cranial nerves II-XII grossly intact, moving all 4 extremities   Lab Results:  Basic Metabolic Panel:  Recent Labs Lab 10/05/13 0800 10/06/13 0545  NA 143 143  K 3.5* 3.6*  CL 106 106  CO2 22 23  GLUCOSE 139* 173*  BUN 40* 42*  CREATININE 1.93* 1.99*  CALCIUM 7.1* 7.0*  MG 1.5  --    Liver Function Tests:  Recent Labs Lab 09/30/13 0501 10/04/13 1440  AST 50* 61*  ALT 67* 79*  ALKPHOS 212* 272*  BILITOT 0.3 0.3  PROT 5.8* 6.3  ALBUMIN 2.0* 2.2*    Recent Labs Lab 09/30/13 0501  AMMONIA 58    CBC:  Recent Labs Lab 10/04/13 1440 10/05/13 0800 10/05/13 1010 10/06/13 0545  WBC 4.3 5.9 4.8 4.4  NEUTROABS 3.6 3.5  --   --   HGB 8.0* 5.8* 7.9* 7.8*  HCT 24.7* 17.7* 24.2* 23.9*  MCV 108.8* 110.6* 111.0* 110.1*  PLT 160 170 141* 140*   Cardiac Enzymes:  Recent Labs Lab 10/04/13 1440  TROPONINI <0.30   BNP:  Recent Labs Lab 10/04/13 1440  PROBNP 1277*   CBG:  Recent Labs Lab 10/05/13 0803 10/05/13 1147 10/05/13 1554 10/05/13 1729 10/05/13 2048 10/06/13 0751  GLUCAP 146* 227* 418* 422* 368* 121*     Alcohol Level:  Recent Labs Lab 09/30/13 0501  ETH <11   Urinalysis:  Recent Labs Lab 09/30/13 0644 10/04/13 2116  COLORURINE YELLOW YELLOW  LABSPEC 1.021 1.019  PHURINE 6.5 6.0  GLUCOSEU NEGATIVE 100*  HGBUR TRACE* NEGATIVE  BILIRUBINUR NEGATIVE NEGATIVE  KETONESUR NEGATIVE NEGATIVE  PROTEINUR >300* >300*  UROBILINOGEN 1.0 1.0  NITRITE NEGATIVE NEGATIVE  LEUKOCYTESUR SMALL* TRACE*   Misc. Labs: none  Micro Results: Recent Results (from the past 240 hour(s))  CULTURE, BLOOD (ROUTINE X  2)     Status: None   Collection Time    09/30/13  4:25 AM      Result Value Ref Range Status   Specimen Description BLOOD HAND LEFT   Final   Special Requests BOTTLES DRAWN AEROBIC ONLY 10CC   Final   Culture  Setup Time     Final   Value: 09/30/2013 09:37     Performed at [facility]   Culture     Final   Value: NO GROWTH 5 DAYS     Performed at [facility]   Report Status 10/06/2013 FINAL   Final  URINE CULTURE     Status: None   Collection Time    09/30/13  6:45 AM      Result Value Ref Range Status   Specimen Description URINE, CLEAN CATCH   Final   Special Requests Immunocompromised   Final   Culture  Setup Time     Final   Value: 09/30/2013 12:56     Performed at [facility] Count     Final   Value: >=100,000 COLONIES/ML     Performed at [facility]   Culture     Final   Value:  CITROBACTER BRAAKII     Performed at [facility]   Report Status 10/02/2013 FINAL   Final   Organism ID, Bacteria CITROBACTER BRAAKII   Final   Studies/Results: X-[facility] Chest Pa And Lateral   10/05/2013   CLINICAL DATA:  Fluid over fluid, short of breath  EXAM: CHEST  2 VIEW  COMPARISON:  Prior chest x-[facility] 07/19/2013  FINDINGS: Stable cardiac and mediastinal contours. Linear atelectasis versus scarring in the lingula slightly more prominent compared to prior. There is pulmonary vascular congestion with cephalization and mild interstitial edema. Nonspecific bibasilar opacities are favored to reflect a combination of dependent edema and subsegmental atelectasis. No pleural effusion or pneumothorax. No acute osseous abnormality.  IMPRESSION: 1. Pulmonary vascular congestion and mild interstitial edema consistent with the clinical history of volume overload. 2. Nonspecific bibasilar patchy airspace opacities favored to reflect a combination of dependent edema and atelectasis. Superimposed infiltrate is difficult to exclude entirely.   Electronically Signed   By: Malachy Moan M.D.   On: 10/05/2013 08:55   Medications: Scheduled Meds: . abacavir  300 mg Oral BID  . aspirin EC  81 mg Oral Daily  . cholecalciferol  1,000 Units Oral Daily  . cloNIDine  0.3 mg Oral BID  . darunavir  800 mg Oral Q breakfast  . furosemide  80 mg Intravenous BID  . heparin  5,000 Units Subcutaneous 3 times per day  . hydrALAZINE  50 mg Oral QID  . insulin aspart  0-20 Units Subcutaneous TID WC  . insulin glargine  16 Units Subcutaneous QHS  . labetalol  400 mg Oral BID  . lamiVUDine  150 mg Oral Daily  . levETIRAcetam  1,500 mg Oral BID  . lisinopril  40 mg Oral Daily  . LORazepam  1 mg Oral Daily  . pantoprazole  40 mg Oral Daily  . phenytoin  100 mg Oral TID  . potassium chloride  30 mEq Oral Once  . predniSONE  40 mg Oral Q breakfast  . ritonavir  100 mg Oral Q breakfast  . sodium bicarbonate  1,300  mg Oral BID  . sodium chloride  3 mL Intravenous Q12H   Continuous Infusions:  PRN Meds:.cyclobenzaprine, hydrOXYzine, oxyCODONE, oxyCODONE-acetaminophen  Assessment/Plan: 53  y.o PMH seizures, stroke, HIV, HCV, pulmonary HTN, uncontrolled HTN, CKD, collapsing FSGS with DM GS, DM2, AIHA, anxiety, nephrotic syndrome, h/o RPR, HLD.  She presented to clinic 5/8 with fluid overload, 25 lb weight gain since 4/24, 3+ leg edema b/l and sob at rest.   #Volume overload Improving.  Pt reports improvement in her LE edema and has shortness of breath with exertion.  Output and weights are not  recorded accurately.  Etiology likely secondary to diet/medication noncompliance, also likely secondary to steroid use and chronic venous insufficiency.  CXR shows pulmonary vascular congestion and mild interstitial edema with nonspecific bibasilar patchy airspace opacities dependent edema and atx with ?superimposed infiltrate (unlikely).   -would decrease lasix 60 mg iv bid for today; with plans to transition to po tomorrow -requested strict Is/Os documentation and daily weights    #Chronic uncontrolled hypertension Pt with uncontrolled BP which has been a chronic issue.  Pt is on steroids which is likely a contributing factor with combined noncompliance which has been an ongoing issue.  -5/9 increased labetalol 200 mg bid to 400 mg bid  -Increase clonidine to 0.3mg  bid, lasix 80 iv bid, hydralazine 50 mg qid, lisinopril 40 mg qd  #HIV disease CD4 280 09/08/13, VL <20. -resumed home meds according to Dr. Feliz BeamSnider's note 09/26/13.  -will confirm these are correct medications by discharge    #CKD  Trending creatinine up to 1.99 today; baseline~2. -BMP   #Hyperglycemia without diabetes  Recent Labs  10/05/13 1729 10/05/13 2048 10/06/13 0751  GLUCAP 422* 368* 121*  Last HA1C (08/02/2013): 6.3.  Most likely medication induced from steroids, possible HIV meds.  -monitor cbg -increased lantus 16 units, increased  SSI to resistant   #Autoimmune hemolytic anemia Stable. H/H today 7.8/23.9; per hematology notes: plans to decrease prednisone to 30 mg daily on 10/09/13 for 1 month, then re-evaluate. -Continue prednisone -Monitor CBC  #History of seizures  -continue home meds -seizure precautions  #Unspecified protein-calorie malnutrition -Monitor  #Asymptomatic bacteriuria  Pt has citrobacter braakii but without symptoms -would not treat given asymptomatic  #F/E/N -NSL -Hypokalemia 3.6 today given Kdur 30 x 1  -2 gram sodium diet   #DVT px  -Heparin sq   Disposition Anticipated discharge in approximately 1-2 day(s).  Social work and care management consulted to assist with discharge planning.   The patient does have a current PCP Christen Bame(Nora Sadek, MD) and [facility].   LOS: 2 days   Marrian SalvageJacquelyn S Keonte Daubenspeck, MD  10/06/2013, 9:34 AM

## 2013-10-07 LAB — CBC WITH DIFFERENTIAL/PLATELET
Basophils Absolute: 0 10*3/uL (ref 0.0–0.1)
Basophils Relative: 0 % (ref 0–1)
Eosinophils Absolute: 0 10*3/uL (ref 0.0–0.7)
Eosinophils Relative: 0 % (ref 0–5)
HCT: 23.9 % — ABNORMAL LOW (ref 36.0–46.0)
Hemoglobin: 7.8 g/dL — ABNORMAL LOW (ref 12.0–15.0)
Lymphocytes Relative: 25 % (ref 12–46)
Lymphs Abs: 1.2 10*3/uL (ref 0.7–4.0)
MCH: 35.9 pg — AB (ref 26.0–34.0)
MCHC: 32.6 g/dL (ref 30.0–36.0)
MCV: 110.1 fL — ABNORMAL HIGH (ref 78.0–100.0)
Monocytes Absolute: 0.7 10*3/uL (ref 0.1–1.0)
Monocytes Relative: 14 % — ABNORMAL HIGH (ref 3–12)
NEUTROS PCT: 61 % (ref 43–77)
Neutro Abs: 2.9 10*3/uL (ref 1.7–7.7)
PLATELETS: 148 10*3/uL — AB (ref 150–400)
RBC: 2.17 MIL/uL — AB (ref 3.87–5.11)
RDW: 13.5 % (ref 11.5–15.5)
WBC: 4.8 10*3/uL (ref 4.0–10.5)

## 2013-10-07 LAB — BASIC METABOLIC PANEL
BUN: 45 mg/dL — ABNORMAL HIGH (ref 6–23)
CALCIUM: 7.1 mg/dL — AB (ref 8.4–10.5)
CO2: 22 mEq/L (ref 19–32)
Chloride: 105 mEq/L (ref 96–112)
Creatinine, Ser: 2.04 mg/dL — ABNORMAL HIGH (ref 0.50–1.10)
GFR calc Af Amer: 31 mL/min — ABNORMAL LOW (ref >90)
GFR, EST NON AFRICAN AMERICAN: 26 mL/min — AB (ref >90)
Glucose, Bld: 212 mg/dL — ABNORMAL HIGH (ref 70–99)
Potassium: 3.7 mEq/L (ref 3.7–5.3)
Sodium: 141 mEq/L (ref 137–147)

## 2013-10-07 LAB — GLUCOSE, CAPILLARY
GLUCOSE-CAPILLARY: 150 mg/dL — AB (ref 70–99)
GLUCOSE-CAPILLARY: 235 mg/dL — AB (ref 70–99)
GLUCOSE-CAPILLARY: 260 mg/dL — AB (ref 70–99)
Glucose-Capillary: 363 mg/dL — ABNORMAL HIGH (ref 70–99)

## 2013-10-07 LAB — PATHOLOGIST SMEAR REVIEW

## 2013-10-07 MED ORDER — INSULIN ASPART 100 UNIT/ML ~~LOC~~ SOLN
10.0000 [IU] | Freq: Three times a day (TID) | SUBCUTANEOUS | Status: DC
  Administered 2013-10-07: 5 [IU] via SUBCUTANEOUS
  Administered 2013-10-07: 15 [IU] via SUBCUTANEOUS
  Administered 2013-10-08: 5 [IU] via SUBCUTANEOUS
  Administered 2013-10-08: 3 [IU] via SUBCUTANEOUS
  Administered 2013-10-08: 8 [IU] via SUBCUTANEOUS
  Administered 2013-10-09: 10 [IU] via SUBCUTANEOUS

## 2013-10-07 MED ORDER — INSULIN GLARGINE 100 UNIT/ML ~~LOC~~ SOLN
20.0000 [IU] | Freq: Every day | SUBCUTANEOUS | Status: DC
  Administered 2013-10-07: 20 [IU] via SUBCUTANEOUS
  Filled 2013-10-07: qty 0.2

## 2013-10-07 MED ORDER — FUROSEMIDE 10 MG/ML IJ SOLN
80.0000 mg | Freq: Three times a day (TID) | INTRAMUSCULAR | Status: DC
  Administered 2013-10-07 – 2013-10-09 (×6): 80 mg via INTRAVENOUS
  Filled 2013-10-07 (×9): qty 8

## 2013-10-07 NOTE — Progress Notes (Signed)
Subjective:  Patient lying in bed in no distress.  VSS.  BP is elevated but this is a chronic issue.  She reports continued improvement of LE swelling and is less SOB.  She has increased urine output and has to urinate while I'm there.  She reports that the nursing staff is not measuring her urine.  She denies any CP, N/V, abdominal pain.  She has been eating well and hey are not feeding her enough.  Regarding SNF, pt is vague when asked if she will go to SNF.  She says that she does not want to, but will go if she has to.        Objective:  Vital signs in last 24 hours: Filed Vitals:   10/07/13 0526 10/07/13 0832 10/07/13 1150 10/07/13 1707  BP: 172/82 206/79 157/79 183/89  Pulse: 80 72 86 80  Temp: 98.2 F (36.8 C) 98.4 F (36.9 C) 98.4 F (36.9 C) 98.4 F (36.9 C)  TempSrc: Oral Oral Oral Oral  Resp: 18 18 18 18   Height:      Weight:      SpO2: 97% 98% 97% 96%   Weight change:   Intake/Output Summary (Last 24 hours) at 10/07/13 2040 Last data filed at 10/07/13 1848  Gross per 24 hour  Intake   1080 ml  Output   1800 ml  Net   -720 ml   Physical Exam: Vitals reviewed. General: lying in bed in NAD HEENT: NCAT, EOMI, no scleral icterus Cardiac: RRR, no rubs, murmurs or gallops Pulm: normal work of breathing, mild bibasilar crackles on exam  Abd: obese; soft, nontender, mildly distended, BS present Ext: warm and well perfused, 2+ edema b/l LE to knees Neuro: alert and oriented X3, cranial nerves II-XII grossly intact, moving all 4 extremities   Lab Results:  Basic Metabolic Panel:  Recent Labs Lab 10/05/13 0800 10/06/13 0545 10/07/13 0540  NA 143 143 141  K 3.5* 3.6* 3.7  CL 106 106 105  CO2 22 23 22   GLUCOSE 139* 173* 212*  BUN 40* 42* 45*  CREATININE 1.93* 1.99* 2.04*  CALCIUM 7.1* 7.0* 7.1*  MG 1.5  --   --    Liver Function Tests:  Recent Labs Lab 10/04/13 1440  AST 61*  ALT 79*  ALKPHOS 272*  BILITOT 0.3  PROT 6.3  ALBUMIN 2.2*   No  results found for this basename: AMMONIA,  in the last 168 hours CBC:  Recent Labs Lab 10/05/13 0800  10/06/13 0545 10/07/13 0540  WBC 5.9  < > 4.4 4.8  NEUTROABS 3.5  --   --  2.9  HGB 5.8*  < > 7.8* 7.8*  HCT 17.7*  < > 23.9* 23.9*  MCV 110.6*  < > 110.1* 110.1*  PLT 170  < > 140* 148*  < > = values in this interval not displayed. Cardiac Enzymes:  Recent Labs Lab 10/04/13 1440  TROPONINI <0.30   BNP:  Recent Labs Lab 10/04/13 1440  PROBNP 1277*   CBG:  Recent Labs Lab 10/06/13 1159 10/06/13 1645 10/06/13 2232 10/07/13 0831 10/07/13 1149 10/07/13 1706  GLUCAP 228* 340* 451* 150* 235* 363*     Alcohol Level: No results found for this basename: ETH,  in the last 168 hours Urinalysis:  Recent Labs Lab 10/04/13 2116  COLORURINE YELLOW  LABSPEC 1.019  PHURINE 6.0  GLUCOSEU 100*  HGBUR NEGATIVE  BILIRUBINUR NEGATIVE  KETONESUR NEGATIVE  PROTEINUR >300*  UROBILINOGEN 1.0  NITRITE NEGATIVE  LEUKOCYTESUR TRACE*   Misc. Labs: none  Micro Results: Recent Results (from the past 240 hour(s))  CULTURE, BLOOD (ROUTINE X 2)     Status: None   Collection Time    09/30/13  4:25 AM      Result Value Ref Range Status   Specimen Description BLOOD HAND LEFT   Final   Special Requests BOTTLES DRAWN AEROBIC ONLY 10CC   Final   Culture  Setup Time     Final   Value: 09/30/2013 09:37     Performed at [facility]   Culture     Final   Value: NO GROWTH 5 DAYS     Performed at [facility]   Report Status 10/06/2013 FINAL   Final  URINE CULTURE     Status: None   Collection Time    09/30/13  6:45 AM      Result Value Ref Range Status   Specimen Description URINE, CLEAN CATCH   Final   Special Requests Immunocompromised   Final   Culture  Setup Time     Final   Value: 09/30/2013 12:56     Performed at [facility] Count     Final   Value: >=100,000 COLONIES/ML     Performed at [facility]   Culture     Final    Value: CITROBACTER BRAAKII     Performed at [facility]   Report Status 10/02/2013 FINAL   Final   Organism ID, Bacteria CITROBACTER BRAAKII   Final   Studies/Results: No results found. Medications: Scheduled Meds: . abacavir  300 mg Oral BID  . aspirin EC  81 mg Oral Daily  . cholecalciferol  1,000 Units Oral Daily  . cloNIDine  0.3 mg Oral BID  . darunavir  800 mg Oral Q breakfast  . furosemide  80 mg Intravenous TID  . heparin  5,000 Units Subcutaneous 3 times per day  . hydrALAZINE  50 mg Oral QID  . insulin aspart  0-20 Units Subcutaneous TID WC  . insulin aspart  10 Units Subcutaneous TID WC  . insulin glargine  20 Units Subcutaneous QHS  . labetalol  400 mg Oral BID  . lamiVUDine  150 mg Oral Daily  . levETIRAcetam  1,500 mg Oral BID  . lisinopril  40 mg Oral Daily  . LORazepam  1 mg Oral Daily  . pantoprazole  40 mg Oral Daily  . phenytoin  100 mg Oral TID  . predniSONE  40 mg Oral Q breakfast  . ritonavir  100 mg Oral Q breakfast  . sodium bicarbonate  1,300 mg Oral BID  . sodium chloride  3 mL Intravenous Q12H   Continuous Infusions:  PRN Meds:.cyclobenzaprine, hydrOXYzine, oxyCODONE, oxyCODONE-acetaminophen  Assessment/Plan: 53 y.o PMH seizures, stroke, HIV, HCV, pulmonary HTN, uncontrolled HTN, CKD, collapsing FSGS with DM GS, DM2, AIHA, anxiety, nephrotic syndrome, h/o RPR, HLD.  She presented to clinic 5/8 with fluid overload, 25 lb weight gain since 4/24, 3+ leg edema b/l and sob at rest.   #Volume overload Improving.  Pt reports improvement in her LE edema and is less short of breath.  Etiology likely secondary to diet/medication noncompliance, also likely secondary to steroid use and chronic venous insufficiency.   -transition to po lasix tomorrow -increase lasix to 80mg  IV tid -strict Is/Os documentation and daily weights   -added TED hose  #Chronic uncontrolled hypertension Pt with uncontrolled BP which has been a chronic issue.  Pt is on  steroids which is likely a contributing factor with combined noncompliance which has been an ongoing issue.  -5/9 increased labetalol 200 mg bid to 400 mg bid  -continue clonidine to 0.3mg  bid, lasix 80 iv bid, hydralazine 50 mg qid, lisinopril 40 mg qd  #HIV disease CD4 280 09/08/13, VL <20. -resumed home meds according to Dr. Feliz BeamSnider's note 09/26/13.  -will confirm these are correct medications by discharge    #CKD  Trending creatinine up to 2.04 today; baseline~2. -BMP   #Hyperglycemia without diabetes  Recent Labs  10/07/13 0831 10/07/13 1149 10/07/13 1706  GLUCAP 150* 235* 363*  Last HA1C (08/02/2013): 6.3.  Most likely medication induced from steroids, possible HIV meds.  -monitor cbg -increased lantus 20 units, SSI-resistant, with additional coverage for meals per diabetes coordinator  #Autoimmune hemolytic anemia Stable. H/H today 7.8/23.9; per hematology notes: plans to decrease prednisone to 30 mg daily on 10/09/13 for 1 month, then re-evaluate. -continue prednisone for now -monitor CBC  #History of seizures  Stable.  Pt has had no seizures while hospitalized.  -continue home meds -seizure precautions  #Unspecified protein-calorie malnutrition -Monitor  #Asymptomatic bacteriuria  Pt has citrobacter braakii but without symptoms -would not treat given asymptomatic  #F/E/N -No IVF -Hypokalemia; replete as needed -2 gram sodium diet   #DVT px  -Heparin sq   Disposition Anticipated discharge in approximately 1-2 day(s).  Social work and care management consulted to assist with discharge planning.   The patient does have a current PCP Christen Bame(Nora Sadek, MD) and [facility] after discharge.   LOS: 3 days   Marrian SalvageJacquelyn S Daleen Steinhaus, MD  10/07/2013, 8:40 PM

## 2013-10-07 NOTE — Progress Notes (Signed)
TED hose applied  

## 2013-10-07 NOTE — Progress Notes (Signed)
OT Cancellation Note  Patient Details Name: Michelle [name] MRN: [id number] DOB: May 05, 1961   Cancelled Treatment:    Reason Eval/Treat Not Completed: OT screened, no needs identified, will sign off (Pt is performing mobility and ADL at a modified independent level)  [place] Bailiff Michelle [name] 10/07/2013, 2:51 PM

## 2013-10-07 NOTE — Progress Notes (Signed)
D/c'd pt's telemetry monitoring per Dr. Alford Highland order. CCMD notified.

## 2013-10-07 NOTE — Progress Notes (Signed)
Inpatient Diabetes Program Recommendations  AACE/ADA: New Consensus Statement on Inpatient Glycemic Control (2013)  Target Ranges:  Prepandial:   less than 140 mg/dL      Peak postprandial:   less than 180 mg/dL (1-2 hours)      Critically ill patients:  140 - 180 mg/dL   Results for SHATON, RANDLEMAN (MRN [id number]) as of 10/07/2013 10:59  Ref. Range 10/06/2013 07:51 10/06/2013 11:59 10/06/2013 16:45 10/06/2013 22:32 10/07/2013 08:31  Glucose-Capillary Latest Range: 70-99 mg/dL 945 (H) 859 (H) 292 (H) 451 (H) 150 (H)   Diabetes history: DM2 Outpatient Diabetes medications: Lantus 15 units QHS, Novolog 10-25 units TID with meals Current orders for Inpatient glycemic control: Lantus 20 units QHS, Novolog 0-20 units AC  Inpatient Diabetes Program Recommendations Insulin - Basal: Noted Lantus was increased from 16 units QHS to 20 units QHS. Insulin - Meal Coverage: Post prandial glucose consistently elevated and patient noted to take Novolog 10-25 units TID with meals as an outpatient. Please consider ordering Novolog 10 units TID with meals if patient eats at least 50% of meals.  Thanks, Orlando Penner, RN, MSN, CCRN Diabetes Coordinator Inpatient Diabetes Program [phone] (Team Pager) [phone] ([facility] office) [phone] [facility] office)

## 2013-10-07 NOTE — Discharge Summary (Signed)
Name: ADELEIGH BARLETTA MRN: [id number] DOB: [birth year] 53 y.o. PCP: Clinton Gallant, MD _________________________________________________  Date of Admission: 10/04/2013  4:19 PM Date of Discharge: 10/14/2013 Attending Physician: Larey Dresser, MD   Discharge Diagnosis: Volume overload  Chronic uncontrolled hypertension HIV disease CKD III Nephrotic syndrome Hyperglycemia  Autoimmune hemolytic anemia Seizure disorder Asymptomatic bacteriuria  Discharge Medications:   Medication List    STOP taking these medications       phenytoin 50 MG tablet  Commonly known as:  DILANTIN INFATABS     sodium bicarbonate 650 MG tablet      TAKE these medications       abacavir 300 MG tablet  Commonly known as:  ZIAGEN  Take 300 mg by mouth 2 (two) times daily.     ACCU-CHEK AVIVA PLUS W/DEVICE Kit  USE AS DIRECTED     acyclovir 800 MG tablet  Commonly known as:  ZOVIRAX  Take 800 mg by mouth 5 (five) times daily.     aspirin EC 81 MG tablet  Take 1 tablet (81 mg total) by mouth daily.     cholecalciferol 1000 UNITS tablet  Commonly known as:  VITAMIN D  Take 1,000 Units by mouth daily.     cloNIDine 0.3 MG tablet  Commonly known as:  CATAPRES  Take 1 tablet (0.3 mg total) by mouth 3 (three) times daily.     cyclobenzaprine 5 MG tablet  Commonly known as:  FLEXERIL  Take 1 tablet (5 mg total) by mouth daily as needed for muscle spasms.     Darunavir Ethanolate 800 MG tablet  Commonly known as:  PREZISTA  Take 1 tablet (800 mg total) by mouth daily with breakfast.     diphenhydrAMINE 25 MG tablet  Commonly known as:  BENADRYL  Take 25 mg by mouth every 6 (six) hours as needed for itching.     furosemide 80 MG tablet  Commonly known as:  LASIX  Take 1 tablet (80 mg total) by mouth 2 (two) times daily.     gentamicin 0.3 % ophthalmic ointment  Commonly known as:  GARAMYCIN  Place 1 application into both eyes 4 (four) times daily.     hydrALAZINE 50 MG tablet    Commonly known as:  APRESOLINE  Take 2 tablets (100 mg total) by mouth 3 (three) times daily.     insulin aspart 100 UNIT/ML injection  Commonly known as:  novoLOG  Inject 7 Units into the skin 3 (three) times daily with meals. Based on sliding scale     insulin glargine 100 UNIT/ML injection  Commonly known as:  LANTUS  Inject 0.3 mLs (30 Units total) into the skin at bedtime.     labetalol 300 MG tablet  Commonly known as:  NORMODYNE  Take 2 tablets (600 mg total) by mouth 2 (two) times daily.     lamiVUDine 150 MG tablet  Commonly known as:  EPIVIR  Take 1 tablet (150 mg total) by mouth daily.     levETIRAcetam 500 MG tablet  Commonly known as:  KEPPRA  Take 3 tablets (1,500 mg total) by mouth 2 (two) times daily.     lisinopril 40 MG tablet  Commonly known as:  PRINIVIL,ZESTRIL  Take 1 tablet (40 mg total) by mouth daily.     methocarbamol 500 MG tablet  Commonly known as:  ROBAXIN  Take 500 mg by mouth 2 (two) times daily as needed for muscle spasms.     oxyCODONE-acetaminophen 10-325  MG per tablet  Commonly known as:  PERCOCET  Take 1 tablet by mouth every 4 (four) hours as needed for pain.     pantoprazole 40 MG tablet  Commonly known as:  PROTONIX  Take 1 tablet (40 mg total) by mouth daily.     phenytoin 300 MG ER capsule  Commonly known as:  DILANTIN  Take 1 capsule (300 mg total) by mouth at bedtime.     phenytoin 300 MG ER capsule  Commonly known as:  DILANTIN  Take 1 capsule (300 mg total) by mouth daily.     phenytoin 30 MG ER capsule  Commonly known as:  DILANTIN  Take 1 capsule (30 mg total) by mouth at bedtime.     potassium chloride 10 MEQ tablet  Commonly known as:  K-DUR,KLOR-CON  Take 1 tablet (10 mEq total) by mouth daily.     predniSONE 20 MG tablet  Commonly known as:  DELTASONE  Take 1.5 tablets (30 mg total) by mouth daily with breakfast.     ritonavir 100 MG Tabs tablet  Commonly known as:  NORVIR  Take 1 tablet (100 mg total)  by mouth daily with breakfast.        Disposition and follow-up:   Ms.Eyvonne M Lallier was discharged from [facility] in Stable condition.    Please address the following problems post discharge:  1. Compliance with medications 2. Seizure activity 3. Volume overload improved  Labs / imaging needed at time of follow-up: CMP, cbc, phenytoin level, keppra level  Pending labs/ test needing follow-up: None   Follow-up Appointments: Follow-up Information   Follow up with [facility] [place]. (10/23/13 9:30 am Dr. Delice Lesch)    Specialty:  Neurology   Contact information:   [address] [place], [place] [address] [phone]      Follow up with Elnora Morrison, MD. (10/18/13 at 10:45 )    Specialty:  Internal Medicine   Contact information:   [place] [address] [phone]       Discharge Instructions: Discharge Instructions   Diet - low sodium heart healthy    Complete by:  As directed      Increase activity slowly    Complete by:  As directed            Consultations: Treatment Team:  Windy Kalata, MD  Procedures Performed:  X-[place] Chest Pa And Lateral   10/05/2013   CLINICAL DATA:  Fluid over fluid, short of breath  EXAM: CHEST  2 VIEW  COMPARISON:  Prior chest x-Kren 07/19/2013  FINDINGS: Stable cardiac and mediastinal contours. Linear atelectasis versus scarring in the lingula slightly more prominent compared to prior. There is pulmonary vascular congestion with cephalization and mild interstitial edema. Nonspecific bibasilar opacities are favored to reflect a combination of dependent edema and subsegmental atelectasis. No pleural effusion or pneumothorax. No acute osseous abnormality.  IMPRESSION: 1. Pulmonary vascular congestion and mild interstitial edema consistent with the clinical history of volume overload. 2. Nonspecific bibasilar patchy airspace opacities favored to reflect a combination of dependent  edema and atelectasis. Superimposed infiltrate is difficult to exclude entirely.   Electronically Signed   By: Jacqulynn Cadet M.D.   On: 10/05/2013 08:55   Ct Head Wo Contrast  09/30/2013   CLINICAL DATA:  Acute on chronic seizures.  EXAM: CT HEAD WITHOUT CONTRAST  TECHNIQUE: Contiguous axial images were obtained from the base of the skull through the vertex without intravenous  contrast.  COMPARISON:  CT HEAD W/O CM dated 09/08/2013  FINDINGS: Mild motion degraded examination. Mild to moderate ventriculomegaly, likely on the basis of global parenchymal brain volume loss as there is commensurate enlargement of cerebral sulci and cerebellar folia, advanced for age though, stable from prior examination. Patchy supratentorial white matter hypodensities may reflect chronic small vessel ischemic disease, similar. No intraparenchymal hemorrhage, mass effect or midline shift.  Basal cisterns are patent. Mild calcific atherosclerosis of the carotid siphons. Visualized paranasal sinuses and mastoid air cells are well aerated. Patient appears mildly hyperteloric. Trace soft tissue density within the left external auditory canal middle reflect cerumen.  IMPRESSION: No acute intracranial process on this mildly motion degraded examination.  Mild global brain atrophy, advanced for age with mild to moderate white matter changes suggesting advanced chronic small vessel ischemic disease for age.   Electronically Signed   By: Elon Alas   On: 09/30/2013 03:57   Ct Head Wo Contrast  09/08/2013   CLINICAL DATA:  Headaches.  EXAM: CT HEAD WITHOUT CONTRAST  TECHNIQUE: Contiguous axial images were obtained from the base of the skull through the vertex without intravenous contrast.  COMPARISON:  07/09/2013  FINDINGS: Prominence of the sulci and ventricles compatible with brain atrophy. Mild low attenuation within the subcortical and periventricular white matter is identified consistent with chronic small vessel ischemic  disease. No acute cortical infarct, hemorrhage, or mass lesion ispresent. No significant extra-axial fluid collection is present. The paranasal sinuses andmastoid air cells are clear. The osseous skull is intact.  IMPRESSION: 1. No acute intracranial abnormalities. 2. Stable appearance of mild chronic small vessel ischemic disease and atrophy.   Electronically Signed   By: Kerby Moors M.D.   On: 09/08/2013 01:36   Ct Chest Wo Contrast  09/08/2013   CLINICAL DATA:  Evaluate left lower lobe nodule, history of HIV, hepatitis-C, chronic kidney disease, diabetes  EXAM: CT CHEST WITHOUT CONTRAST  TECHNIQUE: Multidetector CT imaging of the chest was performed following the standard protocol without IV contrast.  COMPARISON:  DG CHEST 1V PORT dated 07/19/2013; DG CHEST 2 VIEW dated 06/18/2013; CT CHEST W/O CM dated 06/19/2013; CT HEAD W/O CM dated 09/08/2013  FINDINGS: The cavitary approximately 2.7 x 1.7 cm nodule within the left lower lobe (image 31, series 3 is unchanged to minimally increased in size in the interval, previously, 2.2 x 1.8 cm. There is a minimal amount of adjacent subsegmental atelectasis within the left lower lobe, unchanged. There is minimal subsegmental atelectasis within in the right lower lobe. Very minimal dependent subpleural ground-glass atelectasis with the medial basilar segment of the right lower lobe. No new focal airspace opacities. No pleural effusion or pneumothorax. The central pulmonary airways are widely patent.  Scattered shotty mediastinal lymph nodes are individually not enlarged by size criteria. No definitive mediastinal, hilar or axillary lymphadenopathy on this noncontrast examination.  Normal heart size. Coronary artery calcifications. No pericardial effusion. There is mild decreased attenuation of the intra cardiac blood pool suggestive of anemia. Borderline enlarged caliber of the main pulmonary artery measuring approximately 3.2 cm (image 28, series 2). Minimal  atherosclerotic plaque within the posterior aspect of the aortic arch. No definite intramural hematoma. Conventional configuration of the aortic arch.  Limited visualization of the upper abdomen demonstrates mild diffuse thickening of the crux of the left adrenal gland but is otherwise unremarkable.  No acute or aggressive osseous abnormalities. There is pulsation artifact degrading evaluation of the manubrium and superior aspect of the sternum.  Regional soft tissues appear normal. Normal noncontrast appearance of the thyroid gland.  IMPRESSION: 1. The cavitary approximately 2.7 cm nodule within the left lower lobe has minimally increased in size in the interval, previously, 2.2 cm - differential considerations again include inflammatory/infectious etiologies (favored in this patient with HIV), though malignancy is not excluded. Further evaluation with bronchoscopy and/or PET scan could be performed as clinically indicated. 2. No acute cardiopulmonary disease. 3. Coronary artery calcifications.   Electronically Signed   By: Sandi Mariscal M.D.   On: 09/08/2013 11:20    2D Echo: N/A  Cardiac Cath: N/A  Admission HPI: Dorothe Elmore is a 53 year old female with pmh of grade-2 diastolic dysfunction, FSGS, HIV (CD4 280 and VL<20 on 09/08/13), chronic HCV, seizure disorder, CVA, HTN, insulin dependent Type II DM, CKD, gout, and AIHA who presents with swelling for the past month worse in past week.  Pt reports she was doing well until about 1 month ago when she began having worsening LE swelling, progressing to her UE, abdomen, and face. She reports it worsened in the past week with about 25 lb weight gain. She has also been progressively short of breath that is present at rest with fatigue. She reports compliance with lasix 40 mg that was just recently changed to BID. She admits to following a poor diet recently and eating a lot of fast food ([employer], take-out). She reports normal urination with no recent confusion,  nausea, vomiting, abdominal pain, or change in BM.   Juluis Mire, MD   Hospital Course by problem list: Principal Problem:   Fluid overload Active Problems:   Unspecified essential hypertension   HIV disease   CKD (chronic kidney disease) stage 3, GFR 30-59 ml/min   Diabetes   Autoimmune hemolytic anemia   Pulmonary hypertension   Unspecified protein-calorie malnutrition   HCV (hepatitis C virus)   Nephrotic syndrome   Hypokalemia   HTN (hypertension), malignant   RUE edema  -venous doppler to r/o UEDVT pending; no DVT  Fluid overload Pt presented with anasarca for the past month with worsening in the past week with weight gain of 25 lbs.  mproving. Down 1.68L yesterday; weight down ~13 lbs since admission. Etiology likely secondary to diet/medication noncompliance, also likely secondary to steroid use, chronic venous insufficiency, nephrotic syndrome, and grade II diastolic dysfunction.  She was started on lasix with increased dosages until she was having good urine output.  At one point, she was requiring lasix tid.  She was discharged with lasix 80mg  bid.   Her weight on admission was 222 but decreased to 209 lbs upon discharge.  She was down 11.6 liters at discharge.  We monitored her Is/Os and daily weights and was kept on a sodium restricted diet of 2000mg /day.  Renal was consulted and recommended lasix 80mg  bid which is double her previous home dose.  She may benefit from compression stockings as an outpatient.  She should get close follow-up as an outpatient and will need repeat lab work to check electrolytes.    Chronic uncontrolled hypertension  Pt with uncontrolled BP which has been a chronic issue; pt on steroids which is likely contributing in addition to CKD.  BP was very difficult to control.  Medications she should be one include: clonidine 0.3mg  tid, lasix 80mg  bid, hydralazine 100mg  tid, lisinopril 40 mg qd, labetalol 600mg  bid.  HIV disease  Stable.  CD4 280  09/08/13, VL <20.  Resumed home meds according to Dr. Storm Frisk note 09/26/13.  CKD III  Renal consulted.  Cr upon discharge was 2.13.  Baseline~2.   Hyperglycemia without diabetes  Recent Labs   10/13/13 2049  10/14/13 0829  10/14/13 1152   GLUCAP  310*  101*  244*   Last HA1C (08/02/2013): 6.3. Most likely medication induced from steroids, possible HIV meds.  Blood sugars were very difficult to control.  Started on lantus and SSI with additional meal coverage.    Autoimmune hemolytic anemia  Stable; hgb 8.6 upon discharge.  Per hematology notes: plans to decrease prednisone to 30 mg daily on 10/09/13 for 1 month, then re-evaluate.  She was discharged on 19m prednisone per heme's recommendations.    Seizures  Stable.  Pt reportedly had some seizure like activity during hospitalization with neurology consult. 5/15 EEG: abnormalities: 1) frequent epileptiform discharges from the  left posterior quadrant 2) generalized 3 Hz delta activity. Clinical Interpretation: EEG consistent with a potential area of epileptogenicity in the left posterior quadrant. There  was no seizure recorded on this study.  At discharge, continue dilantin 3039mqam and 40065mpm according to neurology's last note.  Continue keppra at 1500m23md.  She will need levels checked at hospital follow-up.   Asymptomatic bacteriuria  Pt with citrobacter braakii but without symptoms.  Would not treat given asymptomatic.    Deconditioning  PT/OTconsulted but not recommending SNF at this time.   Discharge Vitals:   BP 159/104  Pulse 83  Temp(Src) 98.6 F (37 C) (Oral)  Resp 20  Ht _0  (1.651 m)  Wt 209 lb (94.802 kg)  BMI 34.78 kg/m2  SpO2 97%  Discharge Labs:  No results found for this or any previous visit (from the past 24 hour(s)).  Signed: JacqJones Bales [phone]/2015, 4:48 PM   Time Spent on Discharge: 45mi58ms Services [facility] Ordered on Discharge: None

## 2013-10-08 LAB — GLUCOSE, CAPILLARY
Glucose-Capillary: 162 mg/dL — ABNORMAL HIGH (ref 70–99)
Glucose-Capillary: 262 mg/dL — ABNORMAL HIGH (ref 70–99)
Glucose-Capillary: 217 mg/dL — ABNORMAL HIGH (ref 70–99)
Glucose-Capillary: 379 mg/dL — ABNORMAL HIGH (ref 70–99)

## 2013-10-08 LAB — CBC
HCT: 23 % — ABNORMAL LOW (ref 36.0–46.0)
Hemoglobin: 7.6 g/dL — ABNORMAL LOW (ref 12.0–15.0)
MCH: 35.7 pg — ABNORMAL HIGH (ref 26.0–34.0)
MCHC: 33 g/dL (ref 30.0–36.0)
MCV: 108 fL — ABNORMAL HIGH (ref 78.0–100.0)
Platelets: 152 K/uL (ref 150–400)
RBC: 2.13 MIL/uL — ABNORMAL LOW (ref 3.87–5.11)
RDW: 13.1 % (ref 11.5–15.5)
WBC: 4.6 K/uL (ref 4.0–10.5)

## 2013-10-08 LAB — BASIC METABOLIC PANEL WITH GFR
BUN: 48 mg/dL — ABNORMAL HIGH (ref 6–23)
CO2: 25 meq/L (ref 19–32)
Calcium: 7.4 mg/dL — ABNORMAL LOW (ref 8.4–10.5)
Chloride: 102 meq/L (ref 96–112)
Creatinine, Ser: 2.08 mg/dL — ABNORMAL HIGH (ref 0.50–1.10)
GFR calc Af Amer: 30 mL/min — ABNORMAL LOW
GFR calc non Af Amer: 26 mL/min — ABNORMAL LOW
Glucose, Bld: 199 mg/dL — ABNORMAL HIGH (ref 70–99)
Potassium: 3.8 meq/L (ref 3.7–5.3)
Sodium: 142 meq/L (ref 137–147)

## 2013-10-08 LAB — LEVETIRACETAM LEVEL: KEPPRA (LEVETIRACETAM): 48.5 ug/mL

## 2013-10-08 MED ORDER — PREDNISONE 20 MG PO TABS
40.0000 mg | ORAL_TABLET | Freq: Every day | ORAL | Status: AC
  Administered 2013-10-08: 40 mg via ORAL
  Filled 2013-10-08: qty 2

## 2013-10-08 MED ORDER — INSULIN GLARGINE 100 UNIT/ML ~~LOC~~ SOLN
24.0000 [IU] | Freq: Every day | SUBCUTANEOUS | Status: DC
  Administered 2013-10-08: 24 [IU] via SUBCUTANEOUS
  Filled 2013-10-08 (×2): qty 0.24

## 2013-10-08 MED ORDER — PREDNISONE 20 MG PO TABS
30.0000 mg | ORAL_TABLET | Freq: Every day | ORAL | Status: DC
  Administered 2013-10-09 – 2013-10-14 (×6): 30 mg via ORAL
  Filled 2013-10-08 (×7): qty 1

## 2013-10-08 NOTE — Progress Notes (Signed)
Patient refused to have bed alarm be turned on,advised to call whenever she needs to go to bathroom.Call bell within reach. Pranav Lince Heide Scales, RN

## 2013-10-08 NOTE — Care Management Note (Signed)
CARE MANAGEMENT NOTE 10/08/2013  Patient:  Michelle [name], Michelle [name]   Account Number:  [id number]  Date Initiated:  10/08/2013  Documentation initiated by:  Jasmine Pang  Subjective/Objective Assessment:   Consult for [facility] referral  Orders for [facility] and [place] aide.     Action/Plan:   Calls placed to [facility] await return call.  10/08/13 Met with pt re [place] and aide. Pt has used [facility] for [facility] services and has [place] in place with an aide for 2 hr per day.  Therefore additional aide serves are not appropriate.   Anticipated DC Date:  10/08/2013   Anticipated DC Plan:  [place]         [facility] Choice  HOME HEALTH   Choice offered to / List presented to:          [facility] arranged  HH-1 RN      Status of service:  ACYCLOVIR Medicare Important Message given?   (If response is "NO", the following Medicare IM given date fields will be blank) Date Medicare IM given:   Date Additional Medicare IM given:    Discharge Disposition:  [place]  Per UR Regulation:    If discussed at Long Length of Stay Meetings, dates discussed:    Comments:  10/08/2013 Call placed to [facility] office for referral of this pt, await return call. Call place to [facility] rep re this pt eligiblity for [facility]. Jasmine Pang RN MPH, case manager, [phone] addem: Noted orders for [facility] and aide, met with pt who has personal care services in place already with an aide 2hr per day , therefore a [place] aide is not appropriate. Pt selected [facility] for [facility] services. [place] notified. Jasmine Pang RN MPH, [phone], case manager.

## 2013-10-08 NOTE — Progress Notes (Signed)
Pt requesting not to have bed alarm on. Pt educated on reasons bed alarm needs to be applied but pt states she does not want bed alarm activated. Writer able to see that pt is steady on her feet.

## 2013-10-08 NOTE — Progress Notes (Signed)
Subjective: Patient is doing better today.  She denies sob. She wants more food.    Objective: Vital signs in last 24 hours: Filed Vitals:   10/07/13 1707 10/07/13 2100 10/08/13 0500 10/08/13 0930  BP: 183/89 188/80 179/80 188/79  Pulse: 80 82 77 80  Temp: 98.4 F (36.9 C) 97.9 F (36.6 C) 98.3 F (36.8 C) 98.2 F (36.8 C)  TempSrc: Oral Oral Oral Oral  Resp: 18 18 18 17   Height:      Weight:   214 lb 4.8 oz (97.206 kg)   SpO2: 96% 96% 97% 97%   Weight change: -7 lb 4.2 oz (-3.294 kg)  Intake/Output Summary (Last 24 hours) at 10/08/13 1157 Last data filed at 10/08/13 0900  Gross per 24 hour  Intake   1200 ml  Output   3075 ml  Net  -1875 ml   Vitals reviewed. General: resting in bed, NAD HEENT:[place]/at, no scleral icterus Cardiac: RRR, no rubs, murmurs or gallops Pulm: ctab Abd: soft, nontender, nondistended, BS present Ext: warm and well perfused, 1 to 2+ edema b/l lower ext  Neuro: alert and oriented X3, cranial nerves II-XII grossly intact, moving all 4 extremities   Lab Results: Basic Metabolic Panel:  Recent Labs Lab 10/05/13 0800  10/07/13 0540 10/08/13 0535  NA 143  < > 141 142  K 3.5*  < > 3.7 3.8  CL 106  < > 105 102  CO2 22  < > 22 25  GLUCOSE 139*  < > 212* 199*  BUN 40*  < > 45* 48*  CREATININE 1.93*  < > 2.04* 2.08*  CALCIUM 7.1*  < > 7.1* 7.4*  MG 1.5  --   --   --   < > = values in this interval not displayed. Liver Function Tests:  Recent Labs Lab 10/04/13 1440  AST 61*  ALT 79*  ALKPHOS 272*  BILITOT 0.3  PROT 6.3  ALBUMIN 2.2*   CBC:  Recent Labs Lab 10/05/13 0800  10/07/13 0540 10/08/13 0535  WBC 5.9  < > 4.8 4.6  NEUTROABS 3.5  --  2.9  --   HGB 5.8*  < > 7.8* 7.6*  HCT 17.7*  < > 23.9* 23.0*  MCV 110.6*  < > 110.1* 108.0*  PLT 170  < > 148* 152  < > = values in this interval not displayed. Cardiac Enzymes:  Recent Labs Lab 10/04/13 1440  TROPONINI <0.30   BNP:  Recent Labs Lab 10/04/13 1440  PROBNP  1277*   CBG:  Recent Labs Lab 10/06/13 2232 10/07/13 0831 10/07/13 1149 10/07/13 1706 10/07/13 2122 10/08/13 0807  GLUCAP 451* 150* 235* 363* 260* 162*     Urinalysis:  Recent Labs Lab 10/04/13 2116  COLORURINE YELLOW  LABSPEC 1.019  PHURINE 6.0  GLUCOSEU 100*  HGBUR NEGATIVE  BILIRUBINUR NEGATIVE  KETONESUR NEGATIVE  PROTEINUR >300*  UROBILINOGEN 1.0  NITRITE NEGATIVE  LEUKOCYTESUR TRACE*   Misc. Labs: none  Micro Results: Recent Results (from the past 240 hour(s))  CULTURE, BLOOD (ROUTINE X 2)     Status: None   Collection Time    09/30/13  4:25 AM      Result Value Ref Range Status   Specimen Description BLOOD HAND LEFT   Final   Special Requests BOTTLES DRAWN AEROBIC ONLY 10CC   Final   Culture  Setup Time     Final   Value: 09/30/2013 09:37     Performed at [facility]  Culture     Final   Value: NO GROWTH 5 DAYS     Performed at [facility]   Report Status 10/06/2013 FINAL   Final  URINE CULTURE     Status: None   Collection Time    09/30/13  6:45 AM      Result Value Ref Range Status   Specimen Description URINE, CLEAN CATCH   Final   Special Requests Immunocompromised   Final   Culture  Setup Time     Final   Value: 09/30/2013 12:56     Performed at [facility] Count     Final   Value: >=100,000 COLONIES/ML     Performed at [facility]   Culture     Final   Value: [place]     Performed at [facility]   Report Status 10/02/2013 FINAL   Final   Organism ID, Bacteria CITROBACTER BRAAKII   Final   Studies/Results: No results found. Medications: Scheduled Meds: . abacavir  300 mg Oral BID  . aspirin EC  81 mg Oral Daily  . cholecalciferol  1,000 Units Oral Daily  . cloNIDine  0.3 mg Oral BID  . darunavir  800 mg Oral Q breakfast  . furosemide  80 mg Intravenous TID  . heparin  5,000 Units Subcutaneous 3 times per day  . hydrALAZINE  50 mg Oral QID  . insulin aspart   0-20 Units Subcutaneous TID WC  . insulin aspart  10 Units Subcutaneous TID WC  . insulin glargine  24 Units Subcutaneous QHS  . labetalol  400 mg Oral BID  . lamiVUDine  150 mg Oral Daily  . levETIRAcetam  1,500 mg Oral BID  . lisinopril  40 mg Oral Daily  . LORazepam  1 mg Oral Daily  . pantoprazole  40 mg Oral Daily  . phenytoin  100 mg Oral TID  . [START ON 10/09/2013] predniSONE  30 mg Oral Q breakfast  . predniSONE  40 mg Oral Q breakfast  . ritonavir  100 mg Oral Q breakfast  . sodium bicarbonate  1,300 mg Oral BID  . sodium chloride  3 mL Intravenous Q12H   Continuous Infusions:  PRN Meds:.cyclobenzaprine, hydrOXYzine, oxyCODONE, oxyCODONE-acetaminophen Assessment/Plan: 53 y.o PMH seizures, stroke, HIV, HCV, pulmonary HTN, uncontrolled HTN, CKD, collapsing FSGS with DM GS, DM2, AIHA, anxiety, nephrotic syndrome, h/o RPR, HLD.  She presented to clinic 5/8 with fluid overload, 25 lb weight gain since 4/24, 3+ leg edema b/l and sob at rest.   #Fluid overload due to multifactorial etiology -Etiology multifactorial likely 2/2 diet noncompliance, acute on chronic diastolic grade 2 CHF, CKD, nephrotic syndrome, collapsing FSGS with DM GS, low, low Albumin (2.2), uncontrolled HTN leading to on CXR pulmonary vascular congestion and mild interstitial edema. Nonspecific bibasilar patchy airspace opacities dependent edema and atelactasis ? Superimposed infiltrate.   -will continue Lasix 80 mg iv tid with improved of lower ext edema x 1 more day and transition to oral in the am, continue TED hose  -strict i/o, daily weights   #Unspecified essential hypertension -Uncontrolled HTN -Increased Labetalol 400 mg bid, continue Clonidine 0.3 bid, Lasix 80 iv tid, Hydralazine 50 mg qid, Lisinopril 40 mg qd -monitor VS   #HIV disease -CD4 280 09/08/13, VL <20 -resumed home meds according to Dr. Feliz Beam note 09/26/13.  -will confirm these are correct medications by discharge    #CKD (chronic kidney  disease) stage 3, GFR 30-59 ml/min -  trend CMET Cr. In the am BL close to 2  -on sodium bicarbonate   #Diabetes 2  -monitor cbg, Lantus 24 qhs, SSI   #Autoimmune hemolytic anemia -Continue Prednisone. Hemoglobin stable 7.6 will change Prednisone to 30 mg qd starting 5/13.   -Monitor CBC.   #Seizure  -continue home meds -seizure precautions  #Unspecified protein-calorie malnutrition  #F/E/N -NSL -CMET in the am  -2 gram diet   #DVT px  -Heparin sq    Dispo: D/c likely 1-2 days   The patient does have a current PCP Christen Bame(Nora Sadek, MD) and [facility] after discharge.  The patient does not have transportation limitations that hinder transportation to clinic appointments.  .Services Needed at time of discharge: Y = Yes, Blank = No PT:   OT:   RN:   Equipment:   Other:     LOS: 4 days   Annett Gularacy N McLean, MD [phone] 10/08/2013, 11:57 AM

## 2013-10-08 NOTE — Progress Notes (Signed)
Subjective:  VSS.  Pt down 2.3L yesterday; weight down 7.4 lbs since admission; saturating 97% on RA.  She is sitting up in the chair without new complaints and reports feeling good.  She is urinating frequently and thinks her LE swelling has improved.    Objective:  Vital signs in last 24 hours: Filed Vitals:   10/07/13 1707 10/07/13 2100 10/08/13 0500 10/08/13 0930  BP: 183/89 188/80 179/80 188/79  Pulse: 80 82 77 80  Temp: 98.4 F (36.9 C) 97.9 F (36.6 C) 98.3 F (36.8 C) 98.2 F (36.8 C)  TempSrc: Oral Oral Oral Oral  Resp: 18 18 18 17   Height:      Weight:   214 lb 4.8 oz (97.206 kg)   SpO2: 96% 96% 97% 97%   Weight change: -7 lb 4.2 oz (-3.294 kg)  Intake/Output Summary (Last 24 hours) at 10/08/13 1138 Last data filed at 10/08/13 0900  Gross per 24 hour  Intake   1200 ml  Output   3075 ml  Net  -1875 ml  -->down 2.3L yesterday  Physical Exam: Vitals reviewed. General: Sitting in the chair eating breakfast in NAD HEENT: NCAT, EOMI, no scleral icterus Cardiac: RRR, no rubs, murmurs or gallops Pulm: normal work of breathing, mild bibasilar crackles on exam  Abd: obese; soft, nontender, mildly distended, BS present Ext: warm and well perfused, 1+ edema b/l LE to knees (TED hose on) Neuro: alert and oriented X3, cranial nerves II-XII grossly intact, moving all 4 extremities   Lab Results:  Basic Metabolic Panel:  Recent Labs Lab 10/05/13 0800  10/07/13 0540 10/08/13 0535  NA 143  < > 141 142  K 3.5*  < > 3.7 3.8  CL 106  < > 105 102  CO2 22  < > 22 25  GLUCOSE 139*  < > 212* 199*  BUN 40*  < > 45* 48*  CREATININE 1.93*  < > 2.04* 2.08*  CALCIUM 7.1*  < > 7.1* 7.4*  MG 1.5  --   --   --   < > = values in this interval not displayed. Anion gap: 15  Liver Function Tests:  Recent Labs Lab 10/04/13 1440  AST 61*  ALT 79*  ALKPHOS 272*  BILITOT 0.3  PROT 6.3  ALBUMIN 2.2*   No results found for this basename: AMMONIA,  in the last 168  hours CBC:  Recent Labs Lab 10/05/13 0800  10/07/13 0540 10/08/13 0535  WBC 5.9  < > 4.8 4.6  NEUTROABS 3.5  --  2.9  --   HGB 5.8*  < > 7.8* 7.6*  HCT 17.7*  < > 23.9* 23.0*  MCV 110.6*  < > 110.1* 108.0*  PLT 170  < > 148* 152  < > = values in this interval not displayed.  Cardiac Enzymes:  Recent Labs Lab 10/04/13 1440  TROPONINI <0.30   BNP:  Recent Labs Lab 10/04/13 1440  PROBNP 1277*   CBG:  Recent Labs Lab 10/06/13 2232 10/07/13 0831 10/07/13 1149 10/07/13 1706 10/07/13 2122 10/08/13 0807  GLUCAP 451* 150* 235* 363* 260* 162*   Alcohol Level: No results found for this basename: ETH,  in the last 168 hours  Urinalysis:  Recent Labs Lab 10/04/13 2116  COLORURINE YELLOW  LABSPEC 1.019  PHURINE 6.0  GLUCOSEU 100*  HGBUR NEGATIVE  BILIRUBINUR NEGATIVE  KETONESUR NEGATIVE  PROTEINUR >300*  UROBILINOGEN 1.0  NITRITE NEGATIVE  LEUKOCYTESUR TRACE*   Misc. Labs: none  Micro Results: Recent  Results (from the past 240 hour(s))  CULTURE, BLOOD (ROUTINE X 2)     Status: None   Collection Time    09/30/13  4:25 AM      Result Value Ref Range Status   Specimen Description BLOOD HAND LEFT   Final   Special Requests BOTTLES DRAWN AEROBIC ONLY 10CC   Final   Culture  Setup Time     Final   Value: 09/30/2013 09:37     Performed at [facility]   Culture     Final   Value: NO GROWTH 5 DAYS     Performed at [facility]   Report Status 10/06/2013 FINAL   Final  URINE CULTURE     Status: None   Collection Time    09/30/13  6:45 AM      Result Value Ref Range Status   Specimen Description URINE, CLEAN CATCH   Final   Special Requests Immunocompromised   Final   Culture  Setup Time     Final   Value: 09/30/2013 12:56     Performed at [facility] Count     Final   Value: >=100,000 COLONIES/ML     Performed at [facility]   Culture     Final   Value: CITROBACTER BRAAKII     Performed at [facility]   Report Status 10/02/2013 FINAL   Final   Organism ID, Bacteria CITROBACTER BRAAKII   Final   Studies/Results: No results found. Medications: Scheduled Meds: . abacavir  300 mg Oral BID  . aspirin EC  81 mg Oral Daily  . cholecalciferol  1,000 Units Oral Daily  . cloNIDine  0.3 mg Oral BID  . darunavir  800 mg Oral Q breakfast  . furosemide  80 mg Intravenous TID  . heparin  5,000 Units Subcutaneous 3 times per day  . hydrALAZINE  50 mg Oral QID  . insulin aspart  0-20 Units Subcutaneous TID WC  . insulin aspart  10 Units Subcutaneous TID WC  . insulin glargine  24 Units Subcutaneous QHS  . labetalol  400 mg Oral BID  . lamiVUDine  150 mg Oral Daily  . levETIRAcetam  1,500 mg Oral BID  . lisinopril  40 mg Oral Daily  . LORazepam  1 mg Oral Daily  . pantoprazole  40 mg Oral Daily  . phenytoin  100 mg Oral TID  . [START ON 10/09/2013] predniSONE  30 mg Oral Q breakfast  . predniSONE  40 mg Oral Q breakfast  . ritonavir  100 mg Oral Q breakfast  . sodium bicarbonate  1,300 mg Oral BID  . sodium chloride  3 mL Intravenous Q12H   Continuous Infusions:  PRN Meds:.cyclobenzaprine, hydrOXYzine, oxyCODONE, oxyCODONE-acetaminophen  Assessment/Plan:  #Volume overload Improving.  Down 2.3L yesterday; weight down ~7 lbs since admission.  Pt reports improvement in her LE edema and is less short of breath except with exertion.  Etiology likely secondary to diet/medication noncompliance, also likely secondary to steroid use and chronic venous insufficiency.   -increased lasix to 80mg  IV tid (5/11) -strict Is/Os documentation and daily weights   -ambulate with pulse ox -sodium restriction  #Chronic uncontrolled hypertension Pt with uncontrolled BP which has been a chronic issue.  Pt is on steroids which is likely a contributing factor with combined noncompliance which has been an ongoing issue  -continue clonidine to 0.3mg  bid, lasix 80 iv bid, hydralazine 50 mg qid, lisinopril  40 mg qd, labetalol 400mg  bid  #HIV disease CD4 280 09/08/13, VL <20. -resumed home meds according to Dr. Feliz Beam note 09/26/13.  -will confirm these are correct medications by discharge    #CKD  Trending creatinine up to 2.08 today; baseline~2. -BMP   #Hyperglycemia without diabetes  Recent Labs  10/07/13 1706 10/07/13 2122 10/08/13 0807  GLUCAP 363* 260* 162*  Last HA1C (08/02/2013): 6.3.  Most likely medication induced from steroids, possible HIV meds.  -monitor cbg -increase lantus to 24 units, SSI-resistant, with additional coverage for meals per diabetes coordinator  #Autoimmune hemolytic anemia Stable; hgb today 7.6 today; per hematology notes: plans to decrease prednisone to 30 mg daily on 10/09/13 for 1 month, then re-evaluate. -continue prednisone for now; decrease dose to 30mg  tomorrow -monitor CBC  #History of seizures  Stable.  Pt has had no seizures while hospitalized.  -continue home meds -seizure precautions  #Unspecified protein-calorie malnutrition -Monitor  #Asymptomatic bacteriuria  Pt has citrobacter braakii but without symptoms -would not treat given asymptomatic  #Deconditioning PT/OT not recommending SNF.  #F/E/N -No IVF -Hypokalemia; replete as needed -2 gram sodium diet   #DVT px  -Heparin sq   Disposition Anticipated discharge today.    The patient does have a current PCP Christen Bame, MD) and does need an [facility] hospital follow-up appointment after discharge.   LOS: 4 days   Marrian Salvage, MD  10/08/2013, 11:38 AM

## 2013-10-08 NOTE — Clinical Social Work Note (Signed)
CSW received consult for skilled facility. Currently anticipated d/c plan is home with [facility] services. CSW will monitor through discharge and assist if needed.  Genelle Bal, MSW, LCSW [phone]

## 2013-10-09 ENCOUNTER — Other Ambulatory Visit: Payer: Medicaid Other

## 2013-10-09 DIAGNOSIS — N184 Chronic kidney disease, stage 4 (severe): Secondary | ICD-10-CM

## 2013-10-09 LAB — CBC
HCT: 25.6 % — ABNORMAL LOW (ref 36.0–46.0)
HEMOGLOBIN: 8.6 g/dL — AB (ref 12.0–15.0)
MCH: 36.1 pg — ABNORMAL HIGH (ref 26.0–34.0)
MCHC: 33.6 g/dL (ref 30.0–36.0)
MCV: 107.6 fL — ABNORMAL HIGH (ref 78.0–100.0)
Platelets: 120 10*3/uL — ABNORMAL LOW (ref 150–400)
RBC: 2.38 MIL/uL — AB (ref 3.87–5.11)
RDW: 13.1 % (ref 11.5–15.5)
WBC: 3.7 10*3/uL — AB (ref 4.0–10.5)

## 2013-10-09 LAB — COMPREHENSIVE METABOLIC PANEL
ALT: 109 U/L — ABNORMAL HIGH (ref 0–35)
AST: 78 U/L — ABNORMAL HIGH (ref 0–37)
Albumin: 2 g/dL — ABNORMAL LOW (ref 3.5–5.2)
Alkaline Phosphatase: 259 U/L — ABNORMAL HIGH (ref 39–117)
BUN: 50 mg/dL — ABNORMAL HIGH (ref 6–23)
CALCIUM: 7.2 mg/dL — AB (ref 8.4–10.5)
CO2: 24 mEq/L (ref 19–32)
Chloride: 97 mEq/L (ref 96–112)
Creatinine, Ser: 2.05 mg/dL — ABNORMAL HIGH (ref 0.50–1.10)
GFR calc non Af Amer: 26 mL/min — ABNORMAL LOW (ref >90)
GFR, EST AFRICAN AMERICAN: 31 mL/min — AB (ref >90)
GLUCOSE: 303 mg/dL — AB (ref 70–99)
Potassium: 4 mEq/L (ref 3.7–5.3)
SODIUM: 137 meq/L (ref 137–147)
TOTAL PROTEIN: 5.5 g/dL — AB (ref 6.0–8.3)
Total Bilirubin: 0.4 mg/dL (ref 0.3–1.2)

## 2013-10-09 LAB — GLUCOSE, CAPILLARY
Glucose-Capillary: 162 mg/dL — ABNORMAL HIGH (ref 70–99)
Glucose-Capillary: 161 mg/dL — ABNORMAL HIGH (ref 70–99)
Glucose-Capillary: 259 mg/dL — ABNORMAL HIGH (ref 70–99)
Glucose-Capillary: 313 mg/dL — ABNORMAL HIGH (ref 70–99)

## 2013-10-09 LAB — PROTEIN / CREATININE RATIO, URINE
CREATININE, URINE: 62.83 mg/dL
Protein Creatinine Ratio: 7.68 — ABNORMAL HIGH (ref 0.00–0.15)
Total Protein, Urine: 482.8 mg/dL

## 2013-10-09 MED ORDER — CLONIDINE HCL 0.3 MG PO TABS
0.3000 mg | ORAL_TABLET | Freq: Three times a day (TID) | ORAL | Status: DC
  Administered 2013-10-09 – 2013-10-14 (×16): 0.3 mg via ORAL
  Filled 2013-10-09 (×17): qty 1

## 2013-10-09 MED ORDER — INSULIN GLARGINE 100 UNIT/ML ~~LOC~~ SOLN
26.0000 [IU] | Freq: Every day | SUBCUTANEOUS | Status: DC
  Administered 2013-10-09 – 2013-10-11 (×3): 26 [IU] via SUBCUTANEOUS
  Filled 2013-10-09 (×4): qty 0.26

## 2013-10-09 MED ORDER — HYDRALAZINE HCL 50 MG PO TABS
100.0000 mg | ORAL_TABLET | Freq: Three times a day (TID) | ORAL | Status: DC
  Administered 2013-10-09 – 2013-10-14 (×15): 100 mg via ORAL
  Filled 2013-10-09 (×17): qty 2

## 2013-10-09 MED ORDER — INSULIN ASPART 100 UNIT/ML ~~LOC~~ SOLN
0.0000 [IU] | Freq: Every day | SUBCUTANEOUS | Status: DC
  Administered 2013-10-09: 4 [IU] via SUBCUTANEOUS
  Administered 2013-10-10: 3 [IU] via SUBCUTANEOUS
  Administered 2013-10-11: 4 [IU] via SUBCUTANEOUS
  Administered 2013-10-12: 2 [IU] via SUBCUTANEOUS
  Administered 2013-10-13: 4 [IU] via SUBCUTANEOUS

## 2013-10-09 MED ORDER — INSULIN ASPART 100 UNIT/ML ~~LOC~~ SOLN
15.0000 [IU] | Freq: Three times a day (TID) | SUBCUTANEOUS | Status: DC
  Administered 2013-10-09 – 2013-10-10 (×5): 15 [IU] via SUBCUTANEOUS
  Administered 2013-10-11: 2 [IU] via SUBCUTANEOUS
  Administered 2013-10-11 (×2): 5 [IU] via SUBCUTANEOUS
  Administered 2013-10-12 – 2013-10-13 (×6): 15 [IU] via SUBCUTANEOUS

## 2013-10-09 MED ORDER — FUROSEMIDE 10 MG/ML IJ SOLN: 100.0000 mg | Freq: Three times a day (TID) | INTRAMUSCULAR | Status: DC

## 2013-10-09 MED ORDER — FUROSEMIDE 10 MG/ML IJ SOLN
160.0000 mg | Freq: Two times a day (BID) | INTRAVENOUS | Status: DC
  Administered 2013-10-09: 160 mg via INTRAVENOUS
  Filled 2013-10-09 (×4): qty 16

## 2013-10-09 MED ORDER — FUROSEMIDE 10 MG/ML IJ SOLN: 80.0000 mg | Freq: Three times a day (TID) | INTRAMUSCULAR | Status: DC

## 2013-10-09 MED ORDER — HYDRALAZINE HCL 50 MG PO TABS
50.0000 mg | ORAL_TABLET | Freq: Four times a day (QID) | ORAL | Status: DC
  Administered 2013-10-09 (×2): 50 mg via ORAL
  Filled 2013-10-09 (×5): qty 1

## 2013-10-09 NOTE — Plan of Care (Signed)
Problem: Food- and Nutrition-Related Knowledge Deficit (NB-1.1) Goal: Nutrition education Formal process to instruct or train a patient/client in a skill or to impart knowledge to help patients/clients voluntarily manage or modify food choices and eating behavior to maintain or improve health. Outcome: Completed/Met Date Met:  10/09/13 Nutrition Education Note  RD consulted for nutrition education regarding need for Low Sodium diet education.  RD provided "Low Sodium Nutrition Therapy" handout from the Academy of [employer] Dietetics. Reviewed patient's dietary recall. Provided examples on ways to decrease sodium intake in diet. Discouraged intake of processed foods and use of salt shaker. Encouraged fresh fruits and vegetables as well as whole grain sources of carbohydrates to maximize fiber intake.   RD discussed why it is important for patient to adhere to diet recommendations, and emphasized the role of fluids, foods to avoid, and importance of weighing self daily. Teach back method used.  Expect poor compliance.  Body mass index is 36.14 kg/(m^2). Pt meets criteria for Obese Class II based on current BMI.  Current diet order is 2 Gram Sodium, patient is consuming approximately 100% of meals at this time. Labs and medications reviewed. No further nutrition interventions warranted at this time. RD contact information provided. If additional nutrition issues arise, please re-consult RD.   Inda Coke MS, RD, LDN Inpatient Registered Dietitian Pager: [phone] After-hours pager: [phone]

## 2013-10-09 NOTE — H&P (Signed)
Patient's BP 214/87 HR 69,rechecked 219/100 HR 70.Patient denies any c/o at this time. States "it's always high". MD on call notified,no new order this time,just to recheck BP in an hour. Will continue to monitor. Annmargaret Decaprio Heide Scales, RN

## 2013-10-09 NOTE — Progress Notes (Signed)
Inpatient Diabetes Program Recommendations  AACE/ADA: New Consensus Statement on Inpatient Glycemic Control (2013)  Target Ranges:  Prepandial:   less than 140 mg/dL      Peak postprandial:   less than 180 mg/dL (1-2 hours)      Critically ill patients:  140 - 180 mg/dL   Results for Michelle [name], Michelle [name] (MRN [id number]) as of 10/09/2013 08:11  Ref. Range 10/08/2013 08:07 10/08/2013 12:27 10/08/2013 16:06 10/08/2013 20:30  Glucose-Capillary Latest Range: 70-99 mg/dL 449 (H) 201 (H) 007 (H) 379 (H)   Inpatient Diabetes Program Recommendations Correction (SSI): Please consider ordering Novolog bedtime correction scale. Insulin - Meal Coverage: Please consider increasing Novolog meal coverage to 15 units TID with meals.  Thanks, Orlando Penner, RN, MSN, CCRN Diabetes Coordinator Inpatient Diabetes Program [phone] (Team Pager) [phone] ([facility] office) [phone] [facility] office)

## 2013-10-09 NOTE — Progress Notes (Signed)
Rechecked patient's BP 217/82 HR 57,Dr. Hoffman notified,order received to give 10:00 a.m dose of clonidine and hydralazine.Notified pharmacy for clonidine.07:30 Incoming RN made aware. Linley Moxley Heide Scales, RN

## 2013-10-09 NOTE — Progress Notes (Addendum)
Subjective: No acute events overnight, pointing out swollen arms and left abdomen this morning.  States that she recently was evaluated by Renal Dr. Marisue HumbleSanford "Thursday", Oct 03, 2013 whereby addition to hypertension regimen was made but pt cannot recall specifics, states currently declining hemodialysis even if deemed necessary.  When discussing elevated bp pt states "its up all the time and I have not been eating right" in terms of adding salt to her food prior to admission.  Objective: Vital signs in last 24 hours: Filed Vitals:   10/09/13 0424 10/09/13 0520 10/09/13 0634 10/09/13 0811  BP: 214/87 219/100 217/82 205/87  Pulse: 69 70 57 75  Temp: 98.8 F (37.1 C)   98.5 F (36.9 C)  TempSrc: Oral   Oral  Resp: 18   18  Height:      Weight:      SpO2: 94%   98%   Weight change: 2 lb 14.4 oz (1.315 kg)  Intake/Output Summary (Last 24 hours) at 10/09/13 1041 Last data filed at 10/09/13 0700  Gross per 24 hour  Intake    720 ml  Output   2700 ml  Net  -1980 ml   Vitals reviewed. Physical Exam: General: Pleasant, Well-developed, well-nourished, AA, in no acute distress; lying flat in bed Head: facial fullness without plethora Lungs: Normal respiratory effort. Clear to auscultation bilaterally from apices to bases without crackles or wheezes appreciated. Heart: normal rate, regular rhythm, +SEM LUSB Abdomen: BS normoactive. Anasarca with 2-3+ pitting, Soft, obese, non-tender. No masses or organomegaly appreciated.  Extremities: distal pulses intact, anasarca to upper and lower extremities with 3+ pitting Neurologic: grossly non-focal, alert and oriented x3, appropriate and cooperative throughout examination.   Lab Results: Basic Metabolic Panel:  Recent Labs Lab 10/05/13 0800  10/08/13 0535 10/09/13 0330  NA 143  < > 142 137  K 3.5*  < > 3.8 4.0  CL 106  < > 102 97  CO2 22  < > 25 24  GLUCOSE 139*  < > 199* 303*  BUN 40*  < > 48* 50*  CREATININE 1.93*  < > 2.08* 2.05*   CALCIUM 7.1*  < > 7.4* 7.2*  MG 1.5  --   --   --   < > = values in this interval not displayed. Liver Function Tests:  Recent Labs Lab 10/04/13 1440 10/09/13 0330  AST 61* 78*  ALT 79* 109*  ALKPHOS 272* 259*  BILITOT 0.3 0.4  PROT 6.3 5.5*  ALBUMIN 2.2* 2.0*   CBC:  Recent Labs Lab 10/05/13 0800  10/07/13 0540 10/08/13 0535 10/09/13 0330  WBC 5.9  < > 4.8 4.6 3.7*  NEUTROABS 3.5  --  2.9  --   --   HGB 5.8*  < > 7.8* 7.6* 8.6*  HCT 17.7*  < > 23.9* 23.0* 25.6*  MCV 110.6*  < > 110.1* 108.0* 107.6*  PLT 170  < > 148* 152 120*  < > = values in this interval not displayed. Cardiac Enzymes:  Recent Labs Lab 10/04/13 1440  TROPONINI <0.30   BNP:  Recent Labs Lab 10/04/13 1440  PROBNP 1277*   CBG:  Recent Labs Lab 10/07/13 2122 10/08/13 0807 10/08/13 1227 10/08/13 1606 10/08/13 2030 10/09/13 0810  GLUCAP 260* 162* 262* 217* 379* 162*     Urinalysis:  Recent Labs Lab 10/04/13 2116  COLORURINE YELLOW  LABSPEC 1.019  PHURINE 6.0  GLUCOSEU 100*  HGBUR NEGATIVE  BILIRUBINUR NEGATIVE  KETONESUR NEGATIVE  PROTEINUR >300*  UROBILINOGEN 1.0  NITRITE NEGATIVE  LEUKOCYTESUR TRACE*   Misc. Labs: none  Micro Results: Recent Results (from the past 240 hour(s))  CULTURE, BLOOD (ROUTINE X 2)     Status: None   Collection Time    09/30/13  4:25 AM      Result Value Ref Range Status   Specimen Description BLOOD HAND LEFT   Final   Special Requests BOTTLES DRAWN AEROBIC ONLY 10CC   Final   Culture  Setup Time     Final   Value: 09/30/2013 09:37     Performed at [facility]   Culture     Final   Value: NO GROWTH 5 DAYS     Performed at [facility]   Report Status 10/06/2013 FINAL   Final  URINE CULTURE     Status: None   Collection Time    09/30/13  6:45 AM      Result Value Ref Range Status   Specimen Description URINE, CLEAN CATCH   Final   Special Requests Immunocompromised   Final   Culture  Setup Time     Final    Value: 09/30/2013 12:56     Performed at [facility] Count     Final   Value: >=100,000 COLONIES/ML     Performed at [facility]   Culture     Final   Value: CITROBACTER BRAAKII     Performed at [facility]   Report Status 10/02/2013 FINAL   Final   Organism ID, Bacteria CITROBACTER BRAAKII   Final   Studies/Results: No results found. Medications: Scheduled Meds: . abacavir  300 mg Oral BID  . aspirin EC  81 mg Oral Daily  . cholecalciferol  1,000 Units Oral Daily  . cloNIDine  0.3 mg Oral BID  . darunavir  800 mg Oral Q breakfast  . furosemide  80 mg Intravenous TID  . heparin  5,000 Units Subcutaneous 3 times per day  . hydrALAZINE  50 mg Oral 4 times per day  . insulin aspart  0-20 Units Subcutaneous TID WC  . insulin aspart  10 Units Subcutaneous TID WC  . insulin glargine  24 Units Subcutaneous QHS  . labetalol  400 mg Oral BID  . lamiVUDine  150 mg Oral Daily  . levETIRAcetam  1,500 mg Oral BID  . lisinopril  40 mg Oral Daily  . LORazepam  1 mg Oral Daily  . pantoprazole  40 mg Oral Daily  . phenytoin  100 mg Oral TID  . predniSONE  30 mg Oral Q breakfast  . ritonavir  100 mg Oral Q breakfast  . sodium bicarbonate  1,300 mg Oral BID  . sodium chloride  3 mL Intravenous Q12H   Continuous Infusions:  PRN Meds:.cyclobenzaprine, hydrOXYzine, oxyCODONE, oxyCODONE-acetaminophen  Assessment/Plan: Hospital Day # 5 for Michelle [name], a 53 y.o with PMH significant for collapsing variant focal segmental glomerulosclerosis in setting of well controlled HIV and Diabetes, uncontrolled hypertension, pulmonary hypertension, diastolic cardiac dysfunction, seizure disorder, prior stroke and autoimmune hemolytic anemia. She presented to [facility] clinic 5/8 with 25 lb weight gain since 09/20/2013, pitting lower extremity edema and dyspnea at rest.  #1 Volume overload with multifactorial etiology: gross anasarca secondary to diet noncompliance in setting of  nephrotic syndrome and  acute on chronic diastolic CHF. Pt with collapsing FSGS with DM Glomerulosclerosis, hypoalbuminemia with Albumin of 2.2, uncontrolled HTN with CXR demonstrating pulmonary vascular congestion and mild interstitial edema.   -  pt still with significant LE edema, Creatinine appears to range from 1.4-2.0 over the past month, currently 2.05 -encourage TED hose compliance as pt did not have hose on this morning -cont strict i/o, daily weights    Recent Labs Lab 10/05/13 0800 10/06/13 0545 10/07/13 0540 10/08/13 0535 10/09/13 0330  CREATININE 1.93* 1.99* 2.04* 2.08* 2.05*     #2 Uncontrolled hypertension: on Labetalol 400 mg bid, Clonidine 0.3 bid, Lasix 80 iv tid, Hydralazine 50 mg qid, Lisinopril 40 mg qd with sbp 170s-200s, pt on max dose ACEi, BB, vasodilator, alpha blocker, and high dose Lasix -check spot urine protein-creatinine ratio---> ADDENDUM last Renal Clinic note indicates urine protein 590, urine creatinine 60 and ration 9135 at Renal Clinic 09/27/2013 -contact Renal for clarification/recommendations of hypertensive regimen and further recommendations regarding management of FSGS  #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 -Will need clarification from Dr. Verna Czech (Renal);   #4 CKD Stage 4: creatinine stable ~2 over past several days but was 1.7  at Renal clinic 09/27/2013, metabolic acidosis managed with oral sodium bicarbonate -cont sodium bicarbonate  -cont Lasix at current 80 mg IV tid as creatinine allows  #5 HIV disease: well controlled; CD4 280 09/08/13, VL <20 -cont home regimen   #6 Diabetes Mellitus, Type 2: adequate control, DM Coordinator with recommendation for Novolog bedtime and meal coverage -monitor cbg,  -cont Lantus 24 qhs -Increase Novolog to 15 units ac -add Novolog hs coverage  #7 Autoimmune hemolytic  anemia: Hgb stable -planned change Prednisone to 30 mg qd starting 5/13 but will defer change at this time -Monitor CBC.   Recent Labs Lab 10/05/13 1010 10/06/13 0545 10/07/13 0540 10/08/13 0535 10/09/13 0330  HGB 7.9* 7.8* 7.8* 7.6* 8.6*    #8 Seizure disorder: no recent seizures since prior admission -continue home meds -seizure precautions  #Unspecified protein-calorie malnutrition -2 gram diet   #DVT px  -Heparin sq  Dispo: D/c likely 1-2 days   The patient does have a current PCP Christen Bame, MD) and does need an [facility] hospital follow-up appointment after discharge.  The patient does not have transportation limitations that hinder transportation to clinic appointments.  .Services Needed at time of discharge: Y = Yes, Blank = No PT:   OT:   RN:   Equipment:   Other:     LOS: 5 days   Manuela Schwartz, MD [phone] 10/09/2013, 10:41 AM

## 2013-10-09 NOTE — Consult Note (Signed)
Michelle [name] is an 53 y.o. female referred by Dr Michelle [name]   Chief Complaint: CKD3, nephrotic syndrome, HTN HPI: 53yo BF admitted 10/04/13 for worsening edema related to her CKD and nephrotic syndrome.  Pt has little insight into her medical issues which are significant and real.  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  Currently Scr 2.0 and she is diuresing.  BP remains high despite multiple meds with SBP 180-200.  Past Medical History  Diagnosis Date  . Seizures   . Stroke   . Meningitis   . HIV (human immunodeficiency virus infection)   . Hypertension   . Gout   . Muscle spasms of head and/or neck   . CKD (chronic kidney disease)   . CHF (congestive heart failure)     [name] 06/18/2013  . HCV (hepatitis C virus)     chronic/notes 06/18/2013  . Type II diabetes mellitus     [name] 06/18/2013  . AIHA (autoimmune hemolytic anemia)     [name] 06/18/2013  . Hypertensive encephalopathy ~ 05/2013    hospitalaized/notes 06/18/2013  . Daily headache     "for the last 6 years/notes 06/18/2013  . Exertional shortness of breath     [name] 06/18/2013  . Anxiety     [name] 06/18/2013  . Nephrotic syndrome   . History of syphilis   . High cholesterol     Past Surgical History  Procedure Laterality Date  . Hip pinning Right   . Av fistula placement Right 07/24/2013    Procedure: RIGHT arm exploration of antecubital space;  Surgeon: Elam Dutch, MD;  Location: [facility] OR;  Service: Vascular;  Laterality: Right;    Family History  Problem Relation Age of Onset  . Cancer - Colon Mother   . Cancer Father   . Hypertension Father   . Diabetes    . Diabetes Sister    Social History:  reports that she quit smoking about 3 years ago. Her smoking use included Cigarettes. She smoked 0.00 packs per day. She has never used smokeless tobacco. She reports that she does not drink alcohol or use illicit drugs.  Allergies:  Allergies  Allergen Reactions  .  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 Norvasc [Amlodipine Besylate]     Itching, rash , hives .   [name] Morphine And Related Hives, Itching and Rash    Medications Prior to Admission  Medication Sig Dispense Refill  . abacavir (ZIAGEN) 300 MG tablet Take 300 mg by mouth 2 (two) times daily.      [name] acyclovir (ZOVIRAX) 800 MG tablet Take 800 mg by mouth 5 (five) times daily.      [name] aspirin EC 81 MG tablet Take 1 tablet (81 mg total) by mouth daily.  30 tablet  11  . cholecalciferol (VITAMIN D) 1000 UNITS tablet Take 1,000 Units by mouth daily.      . cloNIDine (CATAPRES) 0.2 MG tablet Take 1 tablet (0.2 mg total) by mouth 2 (two) times daily.  60 tablet  11  . cyclobenzaprine (FLEXERIL) 5 MG tablet Take 1 tablet (5 mg total) by mouth daily as needed for muscle spasms.  10 tablet  0  . Darunavir Ethanolate (PREZISTA) 800 MG tablet Take 1 tablet (800 mg total) by mouth daily with breakfast.  30 tablet  11  . diphenhydrAMINE (BENADRYL) 25 MG tablet Take 25 mg by mouth every 6 (six)  hours as needed for itching.      . furosemide (LASIX) 40 MG tablet Take 40 mg by mouth 2 (two) times daily.      [name] gentamicin (GARAMYCIN) 0.3 % ophthalmic ointment Place 1 application into both eyes 4 (four) times daily.      . hydrALAZINE (APRESOLINE) 50 MG tablet Take 1 tablet (50 mg total) by mouth 4 (four) times daily.  120 tablet  3  . insulin aspart (NOVOLOG) 100 UNIT/ML injection Inject 10-25 Units into the skin 3 (three) times daily before meals. Based on sliding scale      . insulin glargine (LANTUS) 100 UNIT/ML injection Inject 15 Units into the skin at bedtime.       [name] labetalol (NORMODYNE) 200 MG tablet Take 1 tablet (200 mg total) by mouth 2 (two) times daily.  60 tablet  2  . lamiVUDine (EPIVIR) 150 MG tablet Take 1 tablet (150 mg total) by mouth daily.  30 tablet  6  . levETIRAcetam (KEPPRA) 500 MG tablet Take 1,000 mg by mouth 2 (two) times daily.       [name]  lisinopril (PRINIVIL,ZESTRIL) 40 MG tablet Take 1 tablet (40 mg total) by mouth daily.  90 tablet  3  . methocarbamol (ROBAXIN) 500 MG tablet Take 500 mg by mouth 2 (two) times daily as needed for muscle spasms.      [name] oxyCODONE-acetaminophen (PERCOCET) 10-325 MG per tablet Take 1 tablet by mouth every 4 (four) hours as needed for pain.  20 tablet  0  . pantoprazole (PROTONIX) 40 MG tablet Take 1 tablet (40 mg total) by mouth daily.  30 tablet  11  . phenytoin (DILANTIN INFATABS) 50 MG tablet Chew 4 tablets (200 mg total) by mouth 2 (two) times daily.  240 tablet  2  . potassium chloride SA (K-DUR,KLOR-CON) 20 MEQ tablet Take 20 mEq by mouth 2 (two) times daily.      . predniSONE (DELTASONE) 20 MG tablet Take 40 mg by mouth daily with breakfast.      . ritonavir (NORVIR) 100 MG TABS tablet Take 1 tablet (100 mg total) by mouth daily with breakfast.  30 tablet  11  . sodium bicarbonate 650 MG tablet Take 2 tablets (1,300 mg total) by mouth 2 (two) times daily.  60 tablet  2  . Blood Glucose Monitoring Suppl (ACCU-CHEK AVIVA PLUS) W/DEVICE KIT USE AS DIRECTED  1 kit  0     Lab Results: UA: >37m protein benign micro   Recent Labs  10/07/13 0540 10/08/13 0535 10/09/13 0330  WBC 4.8 4.6 3.7*  HGB 7.8* 7.6* 8.6*  HCT 23.9* 23.0* 25.6*  PLT 148* 152 120*   BMET  Recent Labs  10/07/13 0540 10/08/13 0535 10/09/13 0330  NA 141 142 137  K 3.7 3.8 4.0  CL 105 102 97  CO2 _0 GLUCOSE 212* 199* 303*  BUN 45* 48* 50*  CREATININE 2.04* 2.08* 2.05*  CALCIUM 7.1* 7.4* 7.2*   LFT  Recent Labs  10/09/13 0330  PROT 5.5*  ALBUMIN 2.0*  AST 78*  ALT 109*  ALKPHOS 259*  BILITOT 0.4   No results found.  ROS: Wears readers No SOB No CP No abd pain No arthritic CO + edema  PHYSICAL EXAM: Blood pressure 180/77, pulse 75, temperature 98.9 F (37.2 C), temperature source Oral, resp. rate 18, height 5' 5" (1.651 m), weight 98.521 kg (217 lb 3.2 oz), SpO2 98.00%. HEENT:  PERRLA EOMI NECK: No JVD  LUNGS:few faint bibasilar crackles CARDIAC:RRR wo MRG ABD:+ BS NTND No HSM  + SQ edema abd wall EXT:3+ edema NEURO:CNI M&SI Ox3 no asterixis  Assessment: 1. CKD3 sec FSGS and diabetes 2. HTN 3. Volume overload 4. HIV 5. DM 6. Hx hemolytic anemia PLAN: 1. I would limit BP meds to 3x/d to improve compliance and thus will change hydralazine to 148m TID and increase clonidine to .317mTID 2. Change lasix to 16045mID 3. She will need to have a base dose of PO lasix which she can add to if needed to help control her edema and keep her out of the hospital.  Whether she has the insight to do this is ?.  Marland Kitchenith her medical issues she would definitely benefit from a health aid/SW/case manager 4. Dietitian to see for low Na diet 5. Better volume control should also help with BP control   MicWindy Kalata13/2015, 12:35 PM

## 2013-10-09 NOTE — Progress Notes (Signed)
Patient lost IV access.  Unable to obtain new IV access.  Notified MD who instructed to have one more IV team nurse assess/ attempt.

## 2013-10-10 ENCOUNTER — Other Ambulatory Visit (HOSPITAL_COMMUNITY): Payer: Self-pay | Admitting: Internal Medicine

## 2013-10-10 ENCOUNTER — Other Ambulatory Visit: Payer: Self-pay | Admitting: Internal Medicine

## 2013-10-10 DIAGNOSIS — I10 Essential (primary) hypertension: Secondary | ICD-10-CM

## 2013-10-10 DIAGNOSIS — R4182 Altered mental status, unspecified: Secondary | ICD-10-CM

## 2013-10-10 LAB — CBC WITH DIFFERENTIAL/PLATELET
BASOS ABS: 0 10*3/uL (ref 0.0–0.1)
BASOS PCT: 0 % (ref 0–1)
Eosinophils Absolute: 0 10*3/uL (ref 0.0–0.7)
Eosinophils Relative: 0 % (ref 0–5)
HEMATOCRIT: 25.2 % — AB (ref 36.0–46.0)
Hemoglobin: 8.3 g/dL — ABNORMAL LOW (ref 12.0–15.0)
Lymphocytes Relative: 32 % (ref 12–46)
Lymphs Abs: 1.3 10*3/uL (ref 0.7–4.0)
MCH: 35.3 pg — ABNORMAL HIGH (ref 26.0–34.0)
MCHC: 32.9 g/dL (ref 30.0–36.0)
MCV: 107.2 fL — ABNORMAL HIGH (ref 78.0–100.0)
Monocytes Absolute: 0.4 10*3/uL (ref 0.1–1.0)
Monocytes Relative: 11 % (ref 3–12)
NEUTROS ABS: 2.3 10*3/uL (ref 1.7–7.7)
Neutrophils Relative %: 57 % (ref 43–77)
Platelets: 153 10*3/uL (ref 150–400)
RBC: 2.35 MIL/uL — ABNORMAL LOW (ref 3.87–5.11)
RDW: 13.2 % (ref 11.5–15.5)
WBC: 4.1 10*3/uL (ref 4.0–10.5)

## 2013-10-10 LAB — GLUCOSE, CAPILLARY
GLUCOSE-CAPILLARY: 298 mg/dL — AB (ref 70–99)
Glucose-Capillary: 113 mg/dL — ABNORMAL HIGH (ref 70–99)
Glucose-Capillary: 245 mg/dL — ABNORMAL HIGH (ref 70–99)
Glucose-Capillary: 240 mg/dL — ABNORMAL HIGH (ref 70–99)
Glucose-Capillary: 292 mg/dL — ABNORMAL HIGH (ref 70–99)

## 2013-10-10 LAB — BASIC METABOLIC PANEL
BUN: 51 mg/dL — ABNORMAL HIGH (ref 6–23)
CO2: 26 mEq/L (ref 19–32)
CREATININE: 2.01 mg/dL — AB (ref 0.50–1.10)
Calcium: 7.8 mg/dL — ABNORMAL LOW (ref 8.4–10.5)
Chloride: 99 mEq/L (ref 96–112)
GFR, EST AFRICAN AMERICAN: 31 mL/min — AB (ref >90)
GFR, EST NON AFRICAN AMERICAN: 27 mL/min — AB (ref >90)
GLUCOSE: 114 mg/dL — AB (ref 70–99)
Potassium: 3.4 mEq/L — ABNORMAL LOW (ref 3.7–5.3)
Sodium: 141 mEq/L (ref 137–147)

## 2013-10-10 MED ORDER — LEVETIRACETAM 500 MG PO TABS
500.0000 mg | ORAL_TABLET | Freq: Two times a day (BID) | ORAL | Status: DC
  Administered 2013-10-10: 500 mg via ORAL
  Filled 2013-10-10: qty 1

## 2013-10-10 MED ORDER — SODIUM CHLORIDE 0.9 % IV SOLN
250.0000 mg | Freq: Once | INTRAVENOUS | Status: DC
  Filled 2013-10-10 (×2): qty 2.5

## 2013-10-10 MED ORDER — ACETAMINOPHEN 325 MG PO TABS: 325.0000 mg | ORAL_TABLET | ORAL | Status: DC | PRN

## 2013-10-10 MED ORDER — FUROSEMIDE 10 MG/ML IJ SOLN: 160.0000 mg | Freq: Two times a day (BID) | INTRAVENOUS | Status: DC

## 2013-10-10 MED ORDER — LEVETIRACETAM 500 MG PO TABS
500.0000 mg | ORAL_TABLET | Freq: Two times a day (BID) | ORAL | Status: DC
  Filled 2013-10-10 (×2): qty 1

## 2013-10-10 MED ORDER — LEVETIRACETAM 500 MG PO TABS
500.0000 mg | ORAL_TABLET | Freq: Two times a day (BID) | ORAL | Status: DC
  Filled 2013-10-10: qty 1

## 2013-10-10 MED ORDER — LORAZEPAM 1 MG PO TABS
2.0000 mg | ORAL_TABLET | ORAL | Status: AC | PRN
  Administered 2013-10-13: 2 mg via ORAL
  Filled 2013-10-10: qty 2

## 2013-10-10 MED ORDER — LORAZEPAM 2 MG/ML IJ SOLN
2.0000 mg | INTRAMUSCULAR | Status: AC | PRN
Start: 2013-10-10 — End: 2013-10-13
  Administered 2013-10-11: 2 mg via INTRAMUSCULAR
  Filled 2013-10-10: qty 1

## 2013-10-10 MED ORDER — POTASSIUM CHLORIDE CRYS ER 20 MEQ PO TBCR
20.0000 meq | EXTENDED_RELEASE_TABLET | Freq: Two times a day (BID) | ORAL | Status: DC
  Administered 2013-10-10 – 2013-10-12 (×5): 20 meq via ORAL
  Filled 2013-10-10 (×6): qty 1

## 2013-10-10 MED ORDER — ACETAMINOPHEN 325 MG PO TABS: 325.0000 mg | ORAL_TABLET | Freq: Three times a day (TID) | ORAL | Status: DC | PRN

## 2013-10-10 MED ORDER — LEVETIRACETAM 250 MG PO TABS
250.0000 mg | ORAL_TABLET | Freq: Once | ORAL | Status: AC
  Administered 2013-10-10: 250 mg via ORAL
  Filled 2013-10-10: qty 1

## 2013-10-10 MED ORDER — PHENYTOIN SODIUM EXTENDED 100 MG PO CAPS
200.0000 mg | ORAL_CAPSULE | Freq: Two times a day (BID) | ORAL | Status: DC
  Administered 2013-10-10 – 2013-10-12 (×4): 200 mg via ORAL
  Filled 2013-10-10 (×5): qty 2

## 2013-10-10 MED ORDER — FUROSEMIDE 80 MG PO TABS
160.0000 mg | ORAL_TABLET | Freq: Two times a day (BID) | ORAL | Status: DC
  Administered 2013-10-10 – 2013-10-14 (×9): 160 mg via ORAL
  Filled 2013-10-10 (×10): qty 2

## 2013-10-10 MED ORDER — LORAZEPAM 2 MG/ML IJ SOLN
2.0000 mg | INTRAMUSCULAR | Status: DC | PRN
Start: 2013-10-10 — End: 2013-10-10

## 2013-10-10 MED ORDER — SODIUM CHLORIDE 0.9 % IV SOLN: 500.0000 mg | Freq: Two times a day (BID) | INTRAVENOUS | Status: DC

## 2013-10-10 NOTE — Progress Notes (Signed)
S:Sitting in chair.  Ate breakfast.  No sob O:BP 180/91  Pulse 79  Temp(Src) 98.3 F (36.8 C) (Oral)  Resp 16  Ht 5\' 5"  (1.651 m)  Wt 98.204 kg (216 lb 8 oz)  BMI 36.03 kg/m2  SpO2 96%  Intake/Output Summary (Last 24 hours) at 10/10/13 0917 Last data filed at 10/10/13 0600  Gross per 24 hour  Intake   1746 ml  Output   4050 ml  Net  -2304 ml   Weight change: -0.318 kg (-11.2 oz) JYN:WGNFAGen:Awake and alert CVS:RRR Resp:few basilar crackles Abd:+ BS NTND Ext:2+ edema NEURO:CNI No asterixis   . abacavir  300 mg Oral BID  . aspirin EC  81 mg Oral Daily  . cholecalciferol  1,000 Units Oral Daily  . cloNIDine  0.3 mg Oral TID  . darunavir  800 mg Oral Q breakfast  . furosemide  160 mg Intravenous BID  . heparin  5,000 Units Subcutaneous 3 times per day  . hydrALAZINE  100 mg Oral 3 times per day  . insulin aspart  0-20 Units Subcutaneous TID WC  . insulin aspart  0-5 Units Subcutaneous QHS  . insulin aspart  15 Units Subcutaneous TID WC  . insulin glargine  26 Units Subcutaneous QHS  . labetalol  400 mg Oral BID  . lamiVUDine  150 mg Oral Daily  . levETIRAcetam  1,500 mg Oral BID  . lisinopril  40 mg Oral Daily  . LORazepam  1 mg Oral Daily  . pantoprazole  40 mg Oral Daily  . phenytoin  100 mg Oral TID  . predniSONE  30 mg Oral Q breakfast  . ritonavir  100 mg Oral Q breakfast  . sodium bicarbonate  1,300 mg Oral BID  . sodium chloride  3 mL Intravenous Q12H   No results found. BMET    Component Value Date/Time   NA 141 10/10/2013 0803   K 3.4* 10/10/2013 0803   CL 99 10/10/2013 0803   CO2 26 10/10/2013 0803   GLUCOSE 114* 10/10/2013 0803   BUN 51* 10/10/2013 0803   CREATININE 2.01* 10/10/2013 0803   CREATININE 1.85* 10/04/2013 1440   CALCIUM 7.8* 10/10/2013 0803   GFRNONAA 27* 10/10/2013 0803   GFRNONAA 27* 08/19/2013 1618   GFRAA 31* 10/10/2013 0803   GFRAA 31* 08/19/2013 1618   CBC    Component Value Date/Time   WBC 4.1 10/10/2013 0803   WBC 6.0 09/25/2013 1353   RBC  2.35* 10/10/2013 0803   RBC 2.21* 09/25/2013 1353   RBC 2.15* 09/09/2013 0600   HGB 8.3* 10/10/2013 0803   HGB 7.9* 09/25/2013 1353   HCT 25.2* 10/10/2013 0803   HCT 23.9* 09/25/2013 1353   PLT 153 10/10/2013 0803   PLT 164 09/25/2013 1353   MCV 107.2* 10/10/2013 0803   MCV 108.1* 09/25/2013 1353   MCH 35.3* 10/10/2013 0803   MCH 35.7* 09/25/2013 1353   MCHC 32.9 10/10/2013 0803   MCHC 33.1 09/25/2013 1353   RDW 13.2 10/10/2013 0803   RDW 14.8* 09/25/2013 1353   LYMPHSABS 1.3 10/10/2013 0803   LYMPHSABS 0.6* 09/25/2013 1353   MONOABS 0.4 10/10/2013 0803   MONOABS 0.3 09/25/2013 1353   EOSABS 0.0 10/10/2013 0803   EOSABS 0.0 09/25/2013 1353   BASOSABS 0.0 10/10/2013 0803   BASOSABS 0.0 09/25/2013 1353     Assessment: 1. CKD3 sec FSGS and DM.  Good UO, renal fx stable 2. Volume overload 3. HTN 4. HIV 5. DM 6. Hx hemolytic anemia  7 Hypokalemia  Plan: 1. Cont with diuretics 2. Cont with antHTN 3. Replace K 4 renal profile in AM   Dyke Maes

## 2013-10-10 NOTE — Progress Notes (Signed)
    Day 6 of stay      Patient name: Michelle [name]  Medical record number: [id number]  Date of birth: [birth year]   I have seen this patient on morning rounds with my team today and I agree with the documentation by Dr Shirlee Latch. The patient had some questionable "seizure activity" today for which we have consulted neurology.  I have discussed the care of this patient with my IM team residents. Please see the resident note for details.  Shayaan Parke 10/10/2013, 4:22 PM.

## 2013-10-10 NOTE — Consult Note (Signed)
NEURO HOSPITALIST CONSULT NOTE    Reason for Consult: seizure  HPI:                                                                                                                                          Michelle [name] is an 53 y.o. female with HIV, history of meningitis, CKD, seizures followed by Dr. Janann Colonel of [place] (last visit was in March --patein was a no show). Patein presented to hospital for progressive LE swelling and face, weight gain and SOB. While hospitalized she did have some issues with elevated BP with SBP 180-200. This AM nursing staff noted patient was not using her right arm and patient felt as though it was tingling. Patient stated "I'm having a seizure" and repeatedly stated "wait" when asked questions. Per nursing staff, she was able to follow simple commands such as tracking their movements and finger and had no post ictal phase. No jerking of either extremity was noted. When discussing situation with patient she is a poor historian and cannot tell me what happened other than she had a seizure.  She states she was awake and able to follow commands of the nurses.  She then becomes defensive about taking her medications.    Dilantin level 5/8 = 6.8 --corrected =13.6 Keppra level 5/8 = 48.5  In speaking with patient she cannot tell me about the seizure this AM other than " I had a seizure". When asked further about her typical seizure she cannot describe them other than saying "I have seizures". Further investigation to obtain information about her medication regime was uneventful. Patient cannot tell me the dose of medication or the frequency.  I have called [facility] to found out last dose of medication and was told she receives her medications from hospital. Upon calling hospital pharmacy [facility] [phone]). I have called this pharmacy and was informed her last prescription was for Keppra 500 mg BID and Dilantin 200 mg BID.  Pateint  currently alert and oriented, in no distress.   Past Medical History  Diagnosis Date  . Seizures   . Stroke   . Meningitis   . HIV (human immunodeficiency virus infection)   . Hypertension   . Gout   . Muscle spasms of head and/or neck   . CKD (chronic kidney disease)   . CHF (congestive heart failure)     [name] 06/18/2013  . HCV (hepatitis C virus)     chronic/notes 06/18/2013  . Type II diabetes mellitus     [name] 06/18/2013  . AIHA (autoimmune hemolytic anemia)     [name] 06/18/2013  . Hypertensive encephalopathy ~ 05/2013    hospitalaized/notes 06/18/2013  . Daily headache     "for the last 6 years/notes  06/18/2013  . Exertional shortness of breath     [name] 06/18/2013  . Anxiety     [name] 06/18/2013  . Nephrotic syndrome   . History of syphilis   . High cholesterol     Past Surgical History  Procedure Laterality Date  . Hip pinning Right   . Av fistula placement Right 07/24/2013    Procedure: RIGHT arm exploration of antecubital space;  Surgeon: Elam Dutch, MD;  Location: [facility] OR;  Service: Vascular;  Laterality: Right;    Family History  Problem Relation Age of Onset  . Cancer - Colon Mother   . Cancer Father   . Hypertension Father   . Diabetes    . Diabetes Sister     Social History:  reports that she quit smoking about 3 years ago. Her smoking use included Cigarettes. She smoked 0.00 packs per day. She has never used smokeless tobacco. She reports that she does not drink alcohol or use illicit drugs.  Allergies  Allergen Reactions  . 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 Norvasc [Amlodipine Besylate]     Itching, rash , hives .   [name] Morphine And Related Hives, Itching and Rash    MEDICATIONS:                                                                                                                     Prior to Admission:  Prescriptions prior to admission  Medication Sig Dispense Refill  .  abacavir (ZIAGEN) 300 MG tablet Take 300 mg by mouth 2 (two) times daily.      [name] acyclovir (ZOVIRAX) 800 MG tablet Take 800 mg by mouth 5 (five) times daily.      [name] aspirin EC 81 MG tablet Take 1 tablet (81 mg total) by mouth daily.  30 tablet  11  . cholecalciferol (VITAMIN D) 1000 UNITS tablet Take 1,000 Units by mouth daily.      . cloNIDine (CATAPRES) 0.2 MG tablet Take 1 tablet (0.2 mg total) by mouth 2 (two) times daily.  60 tablet  11  . cyclobenzaprine (FLEXERIL) 5 MG tablet Take 1 tablet (5 mg total) by mouth daily as needed for muscle spasms.  10 tablet  0  . Darunavir Ethanolate (PREZISTA) 800 MG tablet Take 1 tablet (800 mg total) by mouth daily with breakfast.  30 tablet  11  . diphenhydrAMINE (BENADRYL) 25 MG tablet Take 25 mg by mouth every 6 (six) hours as needed for itching.      . furosemide (LASIX) 40 MG tablet Take 40 mg by mouth 2 (two) times daily.      [name] gentamicin (GARAMYCIN) 0.3 % ophthalmic ointment Place 1 application into both eyes 4 (four) times daily.      . hydrALAZINE (APRESOLINE) 50 MG tablet Take 1 tablet (50 mg total) by mouth 4 (four) times daily.  120 tablet  3  . insulin aspart (NOVOLOG) 100 UNIT/ML injection Inject  10-25 Units into the skin 3 (three) times daily before meals. Based on sliding scale      . insulin glargine (LANTUS) 100 UNIT/ML injection Inject 15 Units into the skin at bedtime.       [name] labetalol (NORMODYNE) 200 MG tablet Take 1 tablet (200 mg total) by mouth 2 (two) times daily.  60 tablet  2  . lamiVUDine (EPIVIR) 150 MG tablet Take 1 tablet (150 mg total) by mouth daily.  30 tablet  6  . levETIRAcetam (KEPPRA) 500 MG tablet Take 1,000 mg by mouth 2 (two) times daily.       [name] lisinopril (PRINIVIL,ZESTRIL) 40 MG tablet Take 1 tablet (40 mg total) by mouth daily.  90 tablet  3  . methocarbamol (ROBAXIN) 500 MG tablet Take 500 mg by mouth 2 (two) times daily as needed for muscle spasms.      [name] oxyCODONE-acetaminophen (PERCOCET) 10-325 MG per  tablet Take 1 tablet by mouth every 4 (four) hours as needed for pain.  20 tablet  0  . pantoprazole (PROTONIX) 40 MG tablet Take 1 tablet (40 mg total) by mouth daily.  30 tablet  11  . phenytoin (DILANTIN INFATABS) 50 MG tablet Chew 4 tablets (200 mg total) by mouth 2 (two) times daily.  240 tablet  2  . potassium chloride SA (K-DUR,KLOR-CON) 20 MEQ tablet Take 20 mEq by mouth 2 (two) times daily.      . predniSONE (DELTASONE) 20 MG tablet Take 40 mg by mouth daily with breakfast.      . ritonavir (NORVIR) 100 MG TABS tablet Take 1 tablet (100 mg total) by mouth daily with breakfast.  30 tablet  11  . sodium bicarbonate 650 MG tablet Take 2 tablets (1,300 mg total) by mouth 2 (two) times daily.  60 tablet  2  . Blood Glucose Monitoring Suppl (ACCU-CHEK AVIVA PLUS) W/DEVICE KIT USE AS DIRECTED  1 kit  0   Scheduled: . abacavir  300 mg Oral BID  . aspirin EC  81 mg Oral Daily  . cholecalciferol  1,000 Units Oral Daily  . cloNIDine  0.3 mg Oral TID  . darunavir  800 mg Oral Q breakfast  . furosemide  160 mg Intravenous BID  . heparin  5,000 Units Subcutaneous 3 times per day  . hydrALAZINE  100 mg Oral 3 times per day  . insulin aspart  0-20 Units Subcutaneous TID WC  . insulin aspart  0-5 Units Subcutaneous QHS  . insulin aspart  15 Units Subcutaneous TID WC  . insulin glargine  26 Units Subcutaneous QHS  . labetalol  400 mg Oral BID  . lamiVUDine  150 mg Oral Daily  . levETIRAcetam  1,500 mg Oral BID  . lisinopril  40 mg Oral Daily  . LORazepam  1 mg Oral Daily  . pantoprazole  40 mg Oral Daily  . phenytoin  100 mg Oral TID  . potassium chloride  20 mEq Oral BID  . predniSONE  30 mg Oral Q breakfast  . ritonavir  100 mg Oral Q breakfast  . sodium bicarbonate  1,300 mg Oral BID  . sodium chloride  3 mL Intravenous Q12H     ROS:  History obtained  from the patient  General ROS: negative for - chills, fatigue, fever, night sweats, weight gain or weight loss Psychological ROS: negative for - behavioral disorder, hallucinations, memory difficulties, mood swings or suicidal ideation Ophthalmic ROS: negative for - blurry vision, double vision, eye pain or loss of vision ENT ROS: negative for - epistaxis, nasal discharge, oral lesions, sore throat, tinnitus or vertigo Allergy and Immunology ROS: negative for - hives or itchy/watery eyes Hematological and Lymphatic ROS: negative for - bleeding problems, bruising or swollen lymph nodes Endocrine ROS: negative for - galactorrhea, hair pattern changes, polydipsia/polyuria or temperature intolerance Respiratory ROS: negative for - cough, hemoptysis, shortness of breath or wheezing Cardiovascular ROS: negative for - chest pain, dyspnea on exertion, edema or irregular heartbeat Gastrointestinal ROS: negative for - abdominal pain, diarrhea, hematemesis, nausea/vomiting or stool incontinence Genito-Urinary ROS: negative for - dysuria, hematuria, incontinence or urinary frequency/urgency Musculoskeletal ROS: negative for - joint swelling or muscular weakness Neurological ROS: as noted in HPI Dermatological ROS: negative for rash and skin lesion changes   Blood pressure 180/91, pulse 79, temperature 98.3 F (36.8 C), temperature source Oral, resp. rate 16, height 5' 5"  (1.651 m), weight 98.204 kg (216 lb 8 oz), SpO2 96.00%.   Neurologic Examination:                                                                                                       Mental Status: Alert, oriented, thought content appropriate.  Speech fluent with some evidence of expressive aphasia.  Able to follow 3 step commands without difficulty. Cranial Nerves: II: Discs flat bilaterally; Visual fields grossly normal, pupils equal, round, reactive to light and accommodation III,IV, VI: ptosis not present, extra-ocular motions  intact bilaterally V,VII: smile symmetric at rest her right NL is lightly lower than left (previous surgery on left corner of mouth), facial light touch sensation normal bilaterally VIII: hearing normal bilaterally IX,X: gag reflex present XI: bilateral shoulder shrug XII: midline tongue extension without atrophy or fasciculations  Motor: Right : Upper extremity   5/5    Left:     Upper extremity   5/5  Lower extremity   5/5     Lower extremity   5/5 Tone and bulk:normal tone throughout; no atrophy noted Sensory: Pinprick and light touch intact throughout, bilaterally Deep Tendon Reflexes:  Right: Upper Extremity   Left: Upper extremity   biceps (C-5 to C-6) 2/4   biceps (C-5 to C-6) 2/4 tricep (C7) 2/4    triceps (C7) 2/4 Brachioradialis (C6) 2/4  Brachioradialis (C6) 2/4  Lower Extremity Lower Extremity  quadriceps (L-2 to L-4) 1/4   quadriceps (L-2 to L-4) 1/4 Achilles (S1) 0/4   Achilles (S1) 0/4  Plantars: Mute bilaterally Cerebellar: normal finger-to-nose,  normal heel-to-shin test Gait: not tested CV: pulses palpable throughout   Lab Results: Basic Metabolic Panel:  Recent Labs Lab 10/05/13 0800 10/06/13 0545 10/07/13 0540 10/08/13 0535 10/09/13 0330 10/10/13 0803  NA 143 143 141 142 137 141  K 3.5* 3.6* 3.7 3.8 4.0 3.4*  CL 106 106 105  102 97 99  CO2 22 23 22 25 24 26   GLUCOSE 139* 173* 212* 199* 303* 114*  BUN 40* 42* 45* 48* 50* 51*  CREATININE 1.93* 1.99* 2.04* 2.08* 2.05* 2.01*  CALCIUM 7.1* 7.0* 7.1* 7.4* 7.2* 7.8*  MG 1.5  --   --   --   --   --     Liver Function Tests:  Recent Labs Lab 10/04/13 1440 10/09/13 0330  AST 61* 78*  ALT 79* 109*  ALKPHOS 272* 259*  BILITOT 0.3 0.4  PROT 6.3 5.5*  ALBUMIN 2.2* 2.0*   No results found for this basename: LIPASE, AMYLASE,  in the last 168 hours No results found for this basename: AMMONIA,  in the last 168 hours  CBC:  Recent Labs Lab 10/04/13 1440 10/05/13 0800  10/06/13 0545  10/07/13 0540 10/08/13 0535 10/09/13 0330 10/10/13 0803  WBC 4.3 5.9  < > 4.4 4.8 4.6 3.7* 4.1  NEUTROABS 3.6 3.5  --   --  2.9  --   --  2.3  HGB 8.0* 5.8*  < > 7.8* 7.8* 7.6* 8.6* 8.3*  HCT 24.7* 17.7*  < > 23.9* 23.9* 23.0* 25.6* 25.2*  MCV 108.8* 110.6*  < > 110.1* 110.1* 108.0* 107.6* 107.2*  PLT 160 170  < > 140* 148* 152 120* 153  < > = values in this interval not displayed.  Cardiac Enzymes:  Recent Labs Lab 10/04/13 1440  TROPONINI <0.30    Lipid Panel: No results found for this basename: CHOL, TRIG, HDL, CHOLHDL, VLDL, LDLCALC,  in the last 168 hours  CBG:  Recent Labs Lab 10/09/13 1135 10/09/13 1634 10/09/13 2113 10/10/13 0824 10/10/13 1049  GLUCAP 161* 259* 313* 113* 245*    Microbiology: Results for orders placed during the hospital encounter of 09/30/13  CULTURE, BLOOD (ROUTINE X 2)     Status: None   Collection Time    09/30/13  4:25 AM      Result Value Ref Range Status   Specimen Description BLOOD HAND LEFT   Final   Special Requests BOTTLES DRAWN AEROBIC ONLY 10CC   Final   Culture  Setup Time     Final   Value: 09/30/2013 09:37     Performed at [facility]   Culture     Final   Value: NO GROWTH 5 DAYS     Performed at [facility]   Report Status 10/06/2013 FINAL   Final  URINE CULTURE     Status: None   Collection Time    09/30/13  6:45 AM      Result Value Ref Range Status   Specimen Description URINE, CLEAN CATCH   Final   Special Requests Immunocompromised   Final   Culture  Setup Time     Final   Value: 09/30/2013 12:56     Performed at [place]     Final   Value: >=100,000 COLONIES/ML     Performed at [facility]   Culture     Final   Value: CITROBACTER BRAAKII     Performed at [facility]   Report Status 10/02/2013 FINAL   Final   Organism ID, Bacteria CITROBACTER BRAAKII   Final    Coagulation Studies: No results found for this basename: LABPROT, INR,  in the  last 72 hours  Imaging: No results found.     Assessment and plan per attending neurologist  Etta Quill PA-C [facility] [phone]  10/10/2013, 12:36 PM  I have seen and evaluated the patient. I have reviewed the above note and made appropriate changes.    Assessment/Plan:  53 YO female with known seizure disorder on both Keppra and Dilantin.  It is unclear if event this AM represents a true seizure, however patient is slightly confused.  At this time patient showing no seizure activity. Given renal insufficiency and high dose of Keppra, Keppra toxicity is a likely cause of her confusion.  After calling pharmacy her last prescribed doses were Keppra 500 mg BID and Dilantin 200 mg BID.   Her level on 100 TID was low theraputic and especially with possible seizure earlier, will increase back to her home dose.   Will decrease keppra given likely toxicity at this dose and GFR.   Recommend: 1) Keppra 500 mg BID (home dose) 2) Dilantin 200 mg BID (home dose)  3) will continue to follow in AM to evaluate for improvment   Roland Rack, MD [facility] [phone]  If 7pm- 7am, please page neurology on call as listed in [place].

## 2013-10-10 NOTE — Progress Notes (Signed)
IV team unable to obtain new IV access.  Notified MD.

## 2013-10-10 NOTE — Progress Notes (Signed)
Explained to patient the importance of calling for help when ambulating  and bed alarm to prevent fall. Patient continue to refuse advise. Will continue to monitor.

## 2013-10-10 NOTE — Care Management Note (Signed)
CARE MANAGEMENT NOTE 10/10/2013  Patient:  Michelle [name], Michelle [name]   Account Number:  [id number]  Date Initiated:  10/08/2013  Documentation initiated by:  Jasmine Pang  Subjective/Objective Assessment:   Consult for [facility] referral  Orders for [facility] and [place] aide.     Action/Plan:   Calls placed to [facility] await return call.  10/08/13 Met with pt re [place] and aide. Pt has used [facility] for [facility] services and has [place] in place with an aide for 2 hr per day.   Additional aide services are not appropriate.   Anticipated DC Date:  10/11/2013   Anticipated DC Plan:  [place]         [facility] Choice  HOME HEALTH   Choice offered to / List presented to:          [facility] arranged  HH-1 RN      Status of service:  [place]IR Medicare Important Message given?   (If response is "NO", the following Medicare IM given date fields will be blank) Date Medicare IM given:   Date Additional Medicare IM given:    Discharge Disposition:  [place]  Per UR Regulation:    If discussed at Long Length of Stay Meetings, dates discussed:    Comments:  10/10/2013 spoke to Maudie Mercury of [facility] and pt is active with that agency , and being followed at home by a case manager. CRoyal RN MPH, case manager, [phone]  10/08/2013 Call placed to [facility] office for referral of this pt, await return call. Call place to ED [facility] rep re this pt eligiblity for [facility]. Jasmine Pang RN MPH, case manager, [phone] addem: Noted orders for [facility] and aide, met with pt who has personal care services in place already with an aide 2hr per day , therefore a [place] aide is not appropriate. Pt selected [facility] for [facility] services. [place] notified. Jasmine Pang RN MPH, [phone], case manager.

## 2013-10-10 NOTE — Progress Notes (Addendum)
Subjective: Patient c/o [facility]/a today 6/10. Denies sob.    RN: later patient more confused and ?seizure activity b/c pt stated she was having a seizure.    Objective: Vital signs in last 24 hours: Filed Vitals:   10/09/13 1635 10/09/13 2115 10/10/13 0545 10/10/13 0812  BP: 179/94 210/93 211/83 180/91  Pulse: 74 72 75 79  Temp: 98.6 F (37 C) 98 F (36.7 C) 98.7 F (37.1 C) 98.3 F (36.8 C)  TempSrc: Oral Oral Oral Oral  Resp: 18 18 16 16   Height:      Weight:  216 lb 8 oz (98.204 kg)    SpO2: 94% 95% 94% 96%   Weight change: -11.2 oz (-0.318 kg)  Intake/Output Summary (Last 24 hours) at 10/10/13 1229 Last data filed at 10/10/13 0600  Gross per 24 hour  Intake   1266 ml  Output   3250 ml  Net  -1984 ml   Vitals reviewed. General: resting in bed, NAD HEENT:[place]/at, no scleral icterus Cardiac: RRR, no rubs, murmurs or gallops Pulm: ctab Abd: soft, nontender, nondistended, BS present Ext: warm and well perfused, 2+ edema b/l lower ext  Neuro: alert and oriented X3, cranial nerves II-XII grossly intact, moving all 4 extremities   Lab Results: Basic Metabolic Panel:  Recent Labs Lab 10/05/13 0800  10/09/13 0330 10/10/13 0803  NA 143  < > 137 141  K 3.5*  < > 4.0 3.4*  CL 106  < > 97 99  CO2 22  < > 24 26  GLUCOSE 139*  < > 303* 114*  BUN 40*  < > 50* 51*  CREATININE 1.93*  < > 2.05* 2.01*  CALCIUM 7.1*  < > 7.2* 7.8*  MG 1.5  --   --   --   < > = values in this interval not displayed. Liver Function Tests:  Recent Labs Lab 10/04/13 1440 10/09/13 0330  AST 61* 78*  ALT 79* 109*  ALKPHOS 272* 259*  BILITOT 0.3 0.4  PROT 6.3 5.5*  ALBUMIN 2.2* 2.0*   CBC:  Recent Labs Lab 10/07/13 0540  10/09/13 0330 10/10/13 0803  WBC 4.8  < > 3.7* 4.1  NEUTROABS 2.9  --   --  2.3  HGB 7.8*  < > 8.6* 8.3*  HCT 23.9*  < > 25.6* 25.2*  MCV 110.1*  < > 107.6* 107.2*  PLT 148*  < > 120* 153  < > = values in this interval not displayed. Cardiac  Enzymes:  Recent Labs Lab 10/04/13 1440  TROPONINI <0.30   BNP:  Recent Labs Lab 10/04/13 1440  PROBNP 1277*   CBG:  Recent Labs Lab 10/09/13 0810 10/09/13 1135 10/09/13 1634 10/09/13 2113 10/10/13 0824 10/10/13 1049  GLUCAP 162* 161* 259* 313* 113* 245*     Urinalysis:  Recent Labs Lab 10/04/13 2116  COLORURINE YELLOW  LABSPEC 1.019  PHURINE 6.0  GLUCOSEU 100*  HGBUR NEGATIVE  BILIRUBINUR NEGATIVE  KETONESUR NEGATIVE  PROTEINUR >300*  UROBILINOGEN 1.0  NITRITE NEGATIVE  LEUKOCYTESUR TRACE*   Misc. Labs: none  Micro Results: No results found for this or any previous visit (from the past 240 hour(s)). Studies/Results: No results found. Medications: Scheduled Meds: . abacavir  300 mg Oral BID  . aspirin EC  81 mg Oral Daily  . cholecalciferol  1,000 Units Oral Daily  . cloNIDine  0.3 mg Oral TID  . darunavir  800 mg Oral Q breakfast  . furosemide  160 mg Intravenous  BID  . heparin  5,000 Units Subcutaneous 3 times per day  . hydrALAZINE  100 mg Oral 3 times per day  . insulin aspart  0-20 Units Subcutaneous TID WC  . insulin aspart  0-5 Units Subcutaneous QHS  . insulin aspart  15 Units Subcutaneous TID WC  . insulin glargine  26 Units Subcutaneous QHS  . labetalol  400 mg Oral BID  . lamiVUDine  150 mg Oral Daily  . levETIRAcetam  1,500 mg Oral BID  . lisinopril  40 mg Oral Daily  . LORazepam  1 mg Oral Daily  . pantoprazole  40 mg Oral Daily  . phenytoin  100 mg Oral TID  . potassium chloride  20 mEq Oral BID  . predniSONE  30 mg Oral Q breakfast  . ritonavir  100 mg Oral Q breakfast  . sodium bicarbonate  1,300 mg Oral BID  . sodium chloride  3 mL Intravenous Q12H   Continuous Infusions:  PRN Meds:.acetaminophen, cyclobenzaprine, hydrOXYzine, LORazepam, oxyCODONE, oxyCODONE-acetaminophen Assessment/Plan: 53 y.o PMH seizures, stroke, HIV, HCV, pulmonary HTN, uncontrolled HTN, CKD, collapsing FSGS with DM GS, DM2, AIHA, anxiety,  nephrotic syndrome, h/o RPR, HLD.  She presented to clinic 5/8 with fluid overload, 25 lb weight gain since 4/24, 3+ leg edema b/l and sob at rest.   #Fluid overload due to multifactorial etiology -Etiology multifactorial likely 2/2 diet noncompliance, acute on chronic diastolic grade 2 CHF, CKD, nephrotic syndrome, collapsing FSGS with DM GS, low, low Albumin (2.2), uncontrolled HTN  -will continue Lasix 160 mg bid with improved of lower ext edema, continue TED hose  -strict i/o, daily weights   #?seizure activity today -patient noted to be confused  -He seizure medications at home were Keppra 1500 mg bid, Dilantin 100 mg ER tid -reconsulted Neuro for recommendations  -Added Ativan 2 mg   #Unspecified essential hypertension -Uncontrolled HTN -Increased Labetalol 400 mg bid, Clonidine 0.3 tid, Lasix 160 iv bid, Hydralazine 100 mg tid, Lisinopril 40 mg qd -monitor VS   #HIV disease -CD4 280 09/08/13, VL <20 -continue meds    #CKD (chronic kidney disease) stage 3, GFR 30-59 ml/min -trend CMET Cr. In the am BL close to 2  -on sodium bicarbonate -Continue Lasix  -renal following   #Diabetes 2  -monitor cbg, Lantus 26 qhs, SSI, Novolog 15 units tid   #Autoimmune hemolytic anemia -Continue Prednisone. Hemoglobin stable   -Monitor CBC.   #Unspecified protein-calorie malnutrition  #F/E/N -NSL -RFP in the am  -Hypok replaced  -2 gram diet   #DVT px  -Heparin sq    Dispo: D/c likely 2 days   The patient does have a current PCP Michelle [name](Michelle Sadek, MD) and [facility] after discharge.  The patient does not have transportation limitations that hinder transportation to clinic appointments.  .Services Needed at time of discharge: Y = Yes, Blank = No PT:   OT:   RN:   Equipment:   Other:     LOS: 6 days   Michelle Gularacy N McLean, MD [phone] 10/10/2013, 12:29 PM

## 2013-10-10 NOTE — Progress Notes (Signed)
During medication pass, patient noted to be using left arm to move right arm.  Patient stated her arm was "wet" and tingling.  Patient became slow to answer questions.  Oriented to person and place but not date or her birthday.  Edema noted in right arm.  Patient stated "I'm having a seizure" and repeatedly stated "wait" when asked questions.  MD notified, vitals and blood sugar obtained.  Patient escorted back to bed and is resting comfortably watching TV.  Patient denies any pain.  MD came to patient bedside.

## 2013-10-11 ENCOUNTER — Inpatient Hospital Stay (HOSPITAL_COMMUNITY): Payer: Medicaid Other

## 2013-10-11 DIAGNOSIS — I10 Essential (primary) hypertension: Secondary | ICD-10-CM

## 2013-10-11 DIAGNOSIS — N186 End stage renal disease: Secondary | ICD-10-CM

## 2013-10-11 LAB — HEPATIC FUNCTION PANEL
ALT: 119 U/L — ABNORMAL HIGH (ref 0–35)
AST: 105 U/L — ABNORMAL HIGH (ref 0–37)
Albumin: 1.9 g/dL — ABNORMAL LOW (ref 3.5–5.2)
Alkaline Phosphatase: 237 U/L — ABNORMAL HIGH (ref 39–117)
BILIRUBIN DIRECT: 0.3 mg/dL (ref 0.0–0.3)
Indirect Bilirubin: 0.2 mg/dL — ABNORMAL LOW (ref 0.3–0.9)
TOTAL PROTEIN: 5.2 g/dL — AB (ref 6.0–8.3)
Total Bilirubin: 0.5 mg/dL (ref 0.3–1.2)

## 2013-10-11 LAB — CBC
HCT: 24.8 % — ABNORMAL LOW (ref 36.0–46.0)
HEMOGLOBIN: 8.1 g/dL — AB (ref 12.0–15.0)
MCH: 35.4 pg — AB (ref 26.0–34.0)
MCHC: 32.7 g/dL (ref 30.0–36.0)
MCV: 108.3 fL — ABNORMAL HIGH (ref 78.0–100.0)
PLATELETS: 141 10*3/uL — AB (ref 150–400)
RBC: 2.29 MIL/uL — ABNORMAL LOW (ref 3.87–5.11)
RDW: 13 % (ref 11.5–15.5)
WBC: 3.8 10*3/uL — AB (ref 4.0–10.5)

## 2013-10-11 LAB — PHENYTOIN LEVEL, TOTAL
PHENYTOIN LVL: 5.4 ug/mL — AB (ref 10.0–20.0)
PHENYTOIN LVL: 13.3 ug/mL (ref 10.0–20.0)

## 2013-10-11 LAB — GLUCOSE, CAPILLARY
GLUCOSE-CAPILLARY: 144 mg/dL — AB (ref 70–99)
GLUCOSE-CAPILLARY: 238 mg/dL — AB (ref 70–99)
Glucose-Capillary: 235 mg/dL — ABNORMAL HIGH (ref 70–99)
Glucose-Capillary: 346 mg/dL — ABNORMAL HIGH (ref 70–99)

## 2013-10-11 LAB — RENAL FUNCTION PANEL
ALBUMIN: 1.9 g/dL — AB (ref 3.5–5.2)
BUN: 54 mg/dL — ABNORMAL HIGH (ref 6–23)
CHLORIDE: 98 meq/L (ref 96–112)
CO2: 26 meq/L (ref 19–32)
CREATININE: 2.07 mg/dL — AB (ref 0.50–1.10)
Calcium: 7.6 mg/dL — ABNORMAL LOW (ref 8.4–10.5)
GFR calc Af Amer: 30 mL/min — ABNORMAL LOW (ref >90)
GFR, EST NON AFRICAN AMERICAN: 26 mL/min — AB (ref >90)
Glucose, Bld: 188 mg/dL — ABNORMAL HIGH (ref 70–99)
POTASSIUM: 3.5 meq/L — AB (ref 3.7–5.3)
Phosphorus: 4.2 mg/dL (ref 2.3–4.6)
SODIUM: 138 meq/L (ref 137–147)

## 2013-10-11 MED ORDER — SODIUM CHLORIDE 0.9 % IV SOLN
500.0000 mg | Freq: Once | INTRAVENOUS | Status: AC
  Administered 2013-10-11: 500 mg via INTRAVENOUS
  Filled 2013-10-11: qty 10

## 2013-10-11 MED ORDER — LEVETIRACETAM 750 MG PO TABS
1500.0000 mg | ORAL_TABLET | Freq: Two times a day (BID) | ORAL | Status: DC
  Administered 2013-10-11 – 2013-10-14 (×6): 1500 mg via ORAL
  Filled 2013-10-11 (×8): qty 2

## 2013-10-11 MED ORDER — LORAZEPAM 2 MG/ML IJ SOLN: 2.0000 mg | Freq: Once | INTRAMUSCULAR | Status: DC

## 2013-10-11 MED ORDER — SODIUM CHLORIDE 0.9 % IV SOLN: 150.0000 mg | Freq: Once | INTRAVENOUS | Status: DC

## 2013-10-11 MED ORDER — HYDRALAZINE HCL 20 MG/ML IJ SOLN
5.0000 mg | INTRAMUSCULAR | Status: DC | PRN
  Administered 2013-10-11 – 2013-10-14 (×5): 5 mg via INTRAVENOUS
  Filled 2013-10-11 (×4): qty 1

## 2013-10-11 MED ORDER — LEVETIRACETAM 500 MG PO TABS
1000.0000 mg | ORAL_TABLET | Freq: Two times a day (BID) | ORAL | Status: DC
  Administered 2013-10-11: 1000 mg via ORAL
  Filled 2013-10-11 (×2): qty 2

## 2013-10-11 NOTE — Progress Notes (Signed)
EEG completed; results pending.    

## 2013-10-11 NOTE — Consult Note (Signed)
PULMONARY / CRITICAL CARE MEDICINE   Name: Michelle [name] MRN: [id number] DOB: 1960-06-09    ADMISSION DATE:  10/04/2013 CONSULTATION DATE: 5/15  REFERRING MD :  TS PRIMARY SERVICE: TS  CHIEF COMPLAINT:  HTN  BRIEF PATIENT DESCRIPTION:  53 yo AAF with a plethora of health issues, the most dominating are seizures(poorly controlled) and Htn(poorly controlled). She was admitted 5/8 with volume overload and had seizure 5/14 and on 5/15 her blood pressure was poorly controlled and PCCM was called. Please note she has not has IV accesses until today and has not received any IV antihypertensives. She is to receive IV  Apresoline for sbp of 200 but has received it at the time of this note. She does not appear to be i nacute distress but is somewhat stunned from recent seizure.  SIGNIFICANT EVENTS / STUDIES:    LINES / TUBES:   CULTURES:   ANTIBIOTICS: HIV meds  HISTORY OF PRESENT ILLNESS:   53 yo AAF with a plethora of health issues, the most dominating are seizures(poorly controlled) and Htn(poorly controlled). She was admitted 5/8 with volume overload and had seizure 5/14 and on 5/15 her blood pressure was poorly controlled and PCCM was called. Please note she has not has IV accesses until today and has not received any IV antihypertensives. She is to receive IV  Apresoline for sbp of 200 but has received it at the time of this note. She does not appear to be i nacute distress but is somewhat stunned from recent seizure.  PAST MEDICAL HISTORY :  Past Medical History  Diagnosis Date  . Seizures   . Stroke   . Meningitis   . HIV (human immunodeficiency virus infection)   . Hypertension   . Gout   . Muscle spasms of head and/or neck   . CKD (chronic kidney disease)   . CHF (congestive heart failure)     [name] 06/18/2013  . HCV (hepatitis C virus)     chronic/notes 06/18/2013  . Type II diabetes mellitus     [name] 06/18/2013  . AIHA (autoimmune hemolytic anemia)     [name] 06/18/2013   . Hypertensive encephalopathy ~ 05/2013    hospitalaized/notes 06/18/2013  . Daily headache     "for the last 6 years/notes 06/18/2013  . Exertional shortness of breath     [name] 06/18/2013  . Anxiety     [name] 06/18/2013  . Nephrotic syndrome   . History of syphilis   . High cholesterol    Past Surgical History  Procedure Laterality Date  . Hip pinning Right   . Av fistula placement Right 07/24/2013    Procedure: RIGHT arm exploration of antecubital space;  Surgeon: Elam Dutch, MD;  Location: [place];  Service: Vascular;  Laterality: Right;   Prior to Admission medications   Medication Sig Start Date End Date Taking? Authorizing Provider  abacavir (ZIAGEN) 300 MG tablet Take 300 mg by mouth 2 (two) times daily.   Yes Historical Provider, MD  acyclovir (ZOVIRAX) 800 MG tablet Take 800 mg by mouth 5 (five) times daily.   Yes Historical Provider, MD  aspirin EC 81 MG tablet Take 1 tablet (81 mg total) by mouth daily. 06/02/13  Yes Lesly Dukes, MD  cholecalciferol (VITAMIN D) 1000 UNITS tablet Take 1,000 Units by mouth daily.   Yes Historical Provider, MD  cloNIDine (CATAPRES) 0.2 MG tablet Take 1 tablet (0.2 mg total) by mouth 2 (two) times daily. 08/05/13 08/05/14 Yes Olga Millers,  MD  cyclobenzaprine (FLEXERIL) 5 MG tablet Take 1 tablet (5 mg total) by mouth daily as needed for muscle spasms. 09/09/13  Yes Corky Sox, MD  Darunavir Ethanolate (PREZISTA) 800 MG tablet Take 1 tablet (800 mg total) by mouth daily with breakfast. 09/26/13  Yes Carlyle Basques, MD  diphenhydrAMINE (BENADRYL) 25 MG tablet Take 25 mg by mouth every 6 (six) hours as needed for itching.   Yes Historical Provider, MD  furosemide (LASIX) 40 MG tablet Take 40 mg by mouth 2 (two) times daily.   Yes Historical Provider, MD  gentamicin (GARAMYCIN) 0.3 % ophthalmic ointment Place 1 application into both eyes 4 (four) times daily.   Yes Historical Provider, MD  hydrALAZINE (APRESOLINE) 50 MG tablet Take 1 tablet (50 mg  total) by mouth 4 (four) times daily. 09/20/13  Yes Clinton Gallant, MD  insulin aspart (NOVOLOG) 100 UNIT/ML injection Inject 10-25 Units into the skin 3 (three) times daily before meals. Based on sliding scale   Yes Historical Provider, MD  insulin glargine (LANTUS) 100 UNIT/ML injection Inject 15 Units into the skin at bedtime.    Yes Historical Provider, MD  labetalol (NORMODYNE) 200 MG tablet Take 1 tablet (200 mg total) by mouth 2 (two) times daily. 09/20/13  Yes Clinton Gallant, MD  lamiVUDine (EPIVIR) 150 MG tablet Take 1 tablet (150 mg total) by mouth daily. 06/20/13  Yes Lesly Dukes, MD  levETIRAcetam (KEPPRA) 500 MG tablet Take 1,000 mg by mouth 2 (two) times daily.    Yes Historical Provider, MD  lisinopril (PRINIVIL,ZESTRIL) 40 MG tablet Take 1 tablet (40 mg total) by mouth daily. 09/04/13  Yes Karren Cobble, MD  methocarbamol (ROBAXIN) 500 MG tablet Take 500 mg by mouth 2 (two) times daily as needed for muscle spasms.   Yes Historical Provider, MD  oxyCODONE-acetaminophen (PERCOCET) 10-325 MG per tablet Take 1 tablet by mouth every 4 (four) hours as needed for pain. 09/20/13  Yes Clinton Gallant, MD  pantoprazole (PROTONIX) 40 MG tablet Take 1 tablet (40 mg total) by mouth daily. 06/02/13  Yes Lesly Dukes, MD  phenytoin (DILANTIN INFATABS) 50 MG tablet Chew 4 tablets (200 mg total) by mouth 2 (two) times daily. 09/09/13  Yes Corky Sox, MD  potassium chloride SA (K-DUR,KLOR-CON) 20 MEQ tablet Take 20 mEq by mouth 2 (two) times daily.   Yes Historical Provider, MD  predniSONE (DELTASONE) 20 MG tablet Take 40 mg by mouth daily with breakfast.   Yes Historical Provider, MD  ritonavir (NORVIR) 100 MG TABS tablet Take 1 tablet (100 mg total) by mouth daily with breakfast. 09/26/13  Yes Carlyle Basques, MD  sodium bicarbonate 650 MG tablet Take 2 tablets (1,300 mg total) by mouth 2 (two) times daily. 09/09/13  Yes Corky Sox, MD  Blood Glucose Monitoring Suppl (ACCU-CHEK AVIVA PLUS) W/DEVICE KIT USE AS DIRECTED  09/04/13   Karren Cobble, MD   Allergies  Allergen Reactions  . 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 Norvasc [Amlodipine Besylate]     Itching, rash , hives .   [name] Morphine And Related Hives, Itching and Rash    FAMILY HISTORY:  Family History  Problem Relation Age of Onset  . Cancer - Colon Mother   . Cancer Father   . Hypertension Father   . Diabetes    . Diabetes Sister    SOCIAL HISTORY:  reports that she quit smoking about 3 years ago. Her  smoking use included Cigarettes. She smoked 0.00 packs per day. She has never used smokeless tobacco. She reports that she does not drink alcohol or use illicit drugs.  REVIEW OF SYSTEMS: NA  SUBJECTIVE:   VITAL SIGNS: Temp:  [98.1 F (36.7 C)-98.5 F (36.9 C)] 98.4 F (36.9 C) (05/15 0810) Pulse Rate:  [73-98] 74 (05/15 0810) Resp:  [18] 18 (05/15 0810) BP: (166-216)/(65-99) 216/95 mmHg (05/15 0810) SpO2:  [94 %-97 %] 96 % (05/15 0810) Weight:  [90.583 kg (199 lb 11.2 oz)] 90.583 kg (199 lb 11.2 oz) (05/14 2043) HEMODYNAMICS:   VENTILATOR SETTINGS:   INTAKE / OUTPUT: Intake/Output     05/14 0701 - 05/15 0700 05/15 0701 - 05/16 0700   P.O. 1440 240   IV Piggyback     Total Intake(mL/kg) 1440 (15.9) 240 (2.6)   Urine (mL/kg/hr) 1875 (0.9) 300 (1)   Total Output 1875 300   Net -435 -60          PHYSICAL EXAMINATION: General:  NAD, stunned Neuro:  Follows command, post szs, speech slow , poor comprehension  HEENT:  PERL,No JVD Cardiovascular:  HSR RRR Lungs:  CTA Abdomen:  Obese Musculoskeletal:  intact Skin:  General edema  LABS:  CBC  Recent Labs Lab 10/09/13 0330 10/10/13 0803 10/11/13 0550  WBC 3.7* 4.1 3.8*  HGB 8.6* 8.3* 8.1*  HCT 25.6* 25.2* 24.8*  PLT 120* 153 141*   Coag's No results found for this basename: APTT, INR,  in the last 168 hours BMET  Recent Labs Lab 10/09/13 0330 10/10/13 0803 10/11/13 0550  NA 137 141 138  K 4.0 3.4* 3.5*   CL 97 99 98  CO2 _0 BUN 50* 51* 54*  CREATININE 2.05* 2.01* 2.07*  GLUCOSE 303* 114* 188*   Electrolytes  Recent Labs Lab 10/05/13 0800  10/09/13 0330 10/10/13 0803 10/11/13 0550  CALCIUM 7.1*  < > 7.2* 7.8* 7.6*  MG 1.5  --   --   --   --   PHOS  --   --   --   --  4.2  < > = values in this interval not displayed. Sepsis Markers No results found for this basename: LATICACIDVEN, PROCALCITON, O2SATVEN,  in the last 168 hours ABG No results found for this basename: PHART, PCO2ART, PO2ART,  in the last 168 hours Liver Enzymes  Recent Labs Lab 10/04/13 1440 10/09/13 0330 10/11/13 0550  AST 61* 78* 105*  ALT 79* 109* 119*  ALKPHOS 272* 259* 237*  BILITOT 0.3 0.4 0.5  ALBUMIN 2.2* 2.0* 1.9*  1.9*   Cardiac Enzymes  Recent Labs Lab 10/04/13 1440  TROPONINI <0.30  PROBNP 1277*   Glucose  Recent Labs Lab 10/10/13 0824 10/10/13 1049 10/10/13 1210 10/10/13 1653 10/10/13 2041 10/11/13 0807  GLUCAP 113* 245* 240* 292* 298* 144*    Imaging No results found.     ASSESSMENT    HTN crisis    Unspecified essential hypertension   HIV disease   CKD (chronic kidney disease) stage 3, GFR 30-59 ml/min   Diabetes   Autoimmune hemolytic anemia   Pulmonary hypertension   Unspecified protein-calorie malnutrition   HCV (hepatitis C virus)   Nephrotic syndrome   Hypokalemia     PLAN: 53 yo AAF with a plethora of health issues, the most dominating are seizures(poorly controlled) and Htn(poorly controlled). She was admitted 5/8 with volume overload and had seizure 5/14 and on 5/15 her blood pressure was poorly controlled and PCCM  was called. Please note she has not has IV accesses until today and has not received any IV antihypertensives. She is to receive IV  Apresoline for sbp of 200 but has received it at the time of this note. She does not appear to be i nacute distress but is somewhat stunned from recent seizure.  HTN   A. IV anti hypertensive as  scheduled (apresolone) B. PO antihypertensives C. If refractory to current treatment then may need Tx to ICU and IV drip ie Cardene. All other issues per IM/Nuerology   Richardson Landry Minor ACNP Maryanna Shape PCCM Pager [phone] till 3 pm If no answer page [phone] 10/11/2013, 10:31 AM  If above plan is ineffective please call CCM for transfer to the ICU for IV drip but would like to maximize treatment first.  PCCM will be available PRN.  Patient seen and examined, agree with above note.  I dictated the care and orders written for this patient under my direction.  Rush Farmer, MD [phone]

## 2013-10-11 NOTE — Procedures (Signed)
History: 53 yo F with HIV and seizures  Sedation: None, ativan received earlier in the day.   Technique: This is a 17 channel routine scalp EEG performed at the bedside with bipolar and monopolar montages arranged in accordance to the international 10/20 system of electrode placement. One channel was dedicated to EKG recording.    Background: There is a posterior dominant rhythm of 8 Hz. There is some irregular generalized delta activity. There are frequent epileptiform discharges P7 >T7,O1 > F7, F3. He sometimes occur in trains, but with no clear evolution. There is irregular delta activity in this distribution as well.  Photic stimulation: Physiologic driving is present  EEG Abnormalities: 1) frequent epileptiform discharges from the left posterior quadrant 2) generalized 3 Hz delta activity  Clinical Interpretation: This EEG is consistent with a potential area of epileptogenicity in the left posterior quadrant. There was no seizure recorded on this study.   Ritta Slot, MD [facility] [phone]  If 7pm- 7am, please page neurology on call as listed in AMION.

## 2013-10-11 NOTE — Progress Notes (Signed)
S: Swelling better.  Note issues with seizures O:BP 148/81  Pulse 86  Temp(Src) 98.7 F (37.1 C) (Oral)  Resp 18  Ht 5\' 5"  (1.651 m)  Wt 90.583 kg (199 lb 11.2 oz)  BMI 33.23 kg/m2  SpO2 95%  Intake/Output Summary (Last 24 hours) at 10/11/13 1413 Last data filed at 10/11/13 0916  Gross per 24 hour  Intake   1200 ml  Output   1775 ml  Net   -575 ml   Weight change: -7.62 kg (-16 lb 12.8 oz) UJW:JXBJYGen:Awake and alert CVS:RRR Resp:few basilar crackles Abd:+ BS NTND Ext:2+ edema NEURO:CNI No asterixis Ox3   . abacavir  300 mg Oral BID  . aspirin EC  81 mg Oral Daily  . cholecalciferol  1,000 Units Oral Daily  . cloNIDine  0.3 mg Oral TID  . darunavir  800 mg Oral Q breakfast  . furosemide  160 mg Oral BID   Or  . furosemide  160 mg Intravenous BID  . heparin  5,000 Units Subcutaneous 3 times per day  . hydrALAZINE  100 mg Oral 3 times per day  . insulin aspart  0-20 Units Subcutaneous TID WC  . insulin aspart  0-5 Units Subcutaneous QHS  . insulin aspart  15 Units Subcutaneous TID WC  . insulin glargine  26 Units Subcutaneous QHS  . labetalol  400 mg Oral BID  . lamiVUDine  150 mg Oral Daily  . levETIRAcetam  1,000 mg Oral BID  . lisinopril  40 mg Oral Daily  . LORazepam  2 mg Intravenous Once  . LORazepam  2 mg Intramuscular Once  . LORazepam  1 mg Oral Daily  . pantoprazole  40 mg Oral Daily  . phenytoin  200 mg Oral BID  . potassium chloride  20 mEq Oral BID  . predniSONE  30 mg Oral Q breakfast  . ritonavir  100 mg Oral Q breakfast  . sodium bicarbonate  1,300 mg Oral BID  . sodium chloride  3 mL Intravenous Q12H   No results found. BMET    Component Value Date/Time   NA 138 10/11/2013 0550   K 3.5* 10/11/2013 0550   CL 98 10/11/2013 0550   CO2 26 10/11/2013 0550   GLUCOSE 188* 10/11/2013 0550   BUN 54* 10/11/2013 0550   CREATININE 2.07* 10/11/2013 0550   CREATININE 1.85* 10/04/2013 1440   CALCIUM 7.6* 10/11/2013 0550   GFRNONAA 26* 10/11/2013 0550   GFRNONAA 27*  08/19/2013 1618   GFRAA 30* 10/11/2013 0550   GFRAA 31* 08/19/2013 1618   CBC    Component Value Date/Time   WBC 3.8* 10/11/2013 0550   WBC 6.0 09/25/2013 1353   RBC 2.29* 10/11/2013 0550   RBC 2.21* 09/25/2013 1353   RBC 2.15* 09/09/2013 0600   HGB 8.1* 10/11/2013 0550   HGB 7.9* 09/25/2013 1353   HCT 24.8* 10/11/2013 0550   HCT 23.9* 09/25/2013 1353   PLT 141* 10/11/2013 0550   PLT 164 09/25/2013 1353   MCV 108.3* 10/11/2013 0550   MCV 108.1* 09/25/2013 1353   MCH 35.4* 10/11/2013 0550   MCH 35.7* 09/25/2013 1353   MCHC 32.7 10/11/2013 0550   MCHC 33.1 09/25/2013 1353   RDW 13.0 10/11/2013 0550   RDW 14.8* 09/25/2013 1353   LYMPHSABS 1.3 10/10/2013 0803   LYMPHSABS 0.6* 09/25/2013 1353   MONOABS 0.4 10/10/2013 0803   MONOABS 0.3 09/25/2013 1353   EOSABS 0.0 10/10/2013 0803   EOSABS 0.0 09/25/2013 1353   BASOSABS  0.0 10/10/2013 0803   BASOSABS 0.0 09/25/2013 1353     Assessment: 1. CKD3 sec FSGS and DM.  Renal fx stable but UO not as good though pt admits she did not save all her urine 2. Volume overload 3. HTN 4. HIV 5. DM 6. Hx hemolytic anemia 7 Hypokalemia 8. Seizure Ds  Plan: 1. Cont with diuretics 2. Cont with antHTN 3. DC bicarb 4. Scr in AM   Dyke Maes

## 2013-10-11 NOTE — Progress Notes (Addendum)
Subjective: Patient having repeated seizures this AM.   Exam: Filed Vitals:   10/11/13 0810  BP: 216/95  Pulse: 74  Temp: 98.4 F (36.9 C)  Resp: 18   Gen: In bed, NAD MS: awake, alert, answers questions, though appears somewhat confused.  UY:QIHKV, EOMI Exam somewhat limited due to IV team actively trying to establish IV access.    Impression: 53 yo F HIV and seizures. Desmond Lope has been significant confusion regarding her AED doses. She currently is subtheraputic on dialntin and I will give an additional 500mg  IV once IV access is obtained. She told me very clearly yesterday adn today that she takes a single pill of keppra twice a day, and after calling her pharmacy was told she had 500mg  pills. I am wondering about the reliability of this history. Her level on keppra was where we would want it. I would be very hesitant to use a dose of 1500mg  BID with a GFR of 30, and would favor third agent after maximizing keppra and dilantin if seizrures are not under control.   Recommendations: 1) Ativan 2mg  IM x 1 while IV access is obtained.  2) dilantin 500mg  IV x 1. 3) will increase keppra back to 1000mg  BID for now  4) continue dilantin 200mg  BID as it is not clear to me that the level this mornign represents a steady state. Due to 0th order kinetics this can be a difficult medication to titrate and will dose based on level for now.  5) recheck dilantin level following load.   Ritta Slot, MD [facility] [phone]  If 7pm- 7am, please page neurology on call as listed in AMION.

## 2013-10-11 NOTE — Progress Notes (Signed)
During assessment pt had approx 2 min frame on confusion. Pt unable to tell writer what she felt at the time; but Clinical research associate able to see a blank stare on pt's face and continuous jabbing motion of Right arm. Continued to ask pt what was she feeling in her arm but pt did not respond. Morning BP 216/95; paged MD twice for further instruction. Waiting on response. Considered giving BP medications early but drawer exchange has not taken place. Pt now able to tell writer where she is, her name, and what year it is. Bed alarm initiated and will continue to monitor.

## 2013-10-11 NOTE — Progress Notes (Signed)
Subjective: Denies h/a, sob.  She is oriented x 3   Objective: Vital signs in last 24 hours: Filed Vitals:   10/10/13 2043 10/11/13 0446 10/11/13 0810 10/11/13 1156  BP: 202/99 190/84 216/95 148/81  Pulse: 73 78 74 86  Temp: 98.5 F (36.9 C) 98.2 F (36.8 C) 98.4 F (36.9 C) 98.7 F (37.1 C)  TempSrc: Oral Oral Oral Oral  Resp: 18 18 18 18   Height:      Weight: 199 lb 11.2 oz (90.583 kg)     SpO2: 96% 95% 96% 95%   Weight change: -16 lb 12.8 oz (-7.62 kg)  Intake/Output Summary (Last 24 hours) at 10/11/13 1621 Last data filed at 10/11/13 1512  Gross per 24 hour  Intake   1440 ml  Output   2025 ml  Net   -585 ml   Vitals reviewed. General: resting in bed, NAD HEENT:Lucas/at, no scleral icterus Cardiac: RRR, no rubs, murmurs or gallops Pulm: ctab Abd: soft, nontender, nondistended, BS present Ext: warm and well perfused, 1 to 2+ edema b/l lower ext  Neuro: alert and oriented X3, cranial nerves II-XII grossly intact, moving all 4 extremities   Lab Results: Basic Metabolic Panel:  Recent Labs Lab 10/05/13 0800  10/10/13 0803 10/11/13 0550  NA 143  < > 141 138  K 3.5*  < > 3.4* 3.5*  CL 106  < > 99 98  CO2 22  < > 26 26  GLUCOSE 139*  < > 114* 188*  BUN 40*  < > 51* 54*  CREATININE 1.93*  < > 2.01* 2.07*  CALCIUM 7.1*  < > 7.8* 7.6*  MG 1.5  --   --   --   PHOS  --   --   --  4.2  < > = values in this interval not displayed. Liver Function Tests:  Recent Labs Lab 10/09/13 0330 10/11/13 0550  AST 78* 105*  ALT 109* 119*  ALKPHOS 259* 237*  BILITOT 0.4 0.5  PROT 5.5* 5.2*  ALBUMIN 2.0* 1.9*  1.9*   CBC:  Recent Labs Lab 10/07/13 0540  10/10/13 0803 10/11/13 0550  WBC 4.8  < > 4.1 3.8*  NEUTROABS 2.9  --  2.3  --   HGB 7.8*  < > 8.3* 8.1*  HCT 23.9*  < > 25.2* 24.8*  MCV 110.1*  < > 107.2* 108.3*  PLT 148*  < > 153 141*  < > = values in this interval not displayed. CBG:  Recent Labs Lab 10/10/13 1049 10/10/13 1210 10/10/13 1653  10/10/13 2041 10/11/13 0807 10/11/13 1155  GLUCAP 245* 240* 292* 298* 144* 238*     Urinalysis:  Recent Labs Lab 10/04/13 2116  COLORURINE YELLOW  LABSPEC 1.019  PHURINE 6.0  GLUCOSEU 100*  HGBUR NEGATIVE  BILIRUBINUR NEGATIVE  KETONESUR NEGATIVE  PROTEINUR >300*  UROBILINOGEN 1.0  NITRITE NEGATIVE  LEUKOCYTESUR TRACE*   Misc. Labs: none  Micro Results: No results found for this or any previous visit (from the past 240 hour(s)). Studies/Results: No results found. Medications: Scheduled Meds: . abacavir  300 mg Oral BID  . aspirin EC  81 mg Oral Daily  . cholecalciferol  1,000 Units Oral Daily  . cloNIDine  0.3 mg Oral TID  . darunavir  800 mg Oral Q breakfast  . furosemide  160 mg Oral BID   Or  . furosemide  160 mg Intravenous BID  . heparin  5,000 Units Subcutaneous 3 times per day  . hydrALAZINE  100 mg Oral 3 times per day  . insulin aspart  0-20 Units Subcutaneous TID WC  . insulin aspart  0-5 Units Subcutaneous QHS  . insulin aspart  15 Units Subcutaneous TID WC  . insulin glargine  26 Units Subcutaneous QHS  . labetalol  400 mg Oral BID  . lamiVUDine  150 mg Oral Daily  . levETIRAcetam  1,500 mg Oral BID  . lisinopril  40 mg Oral Daily  . LORazepam  2 mg Intravenous Once  . LORazepam  2 mg Intramuscular Once  . LORazepam  1 mg Oral Daily  . pantoprazole  40 mg Oral Daily  . phenytoin  200 mg Oral BID  . potassium chloride  20 mEq Oral BID  . predniSONE  30 mg Oral Q breakfast  . ritonavir  100 mg Oral Q breakfast  . sodium chloride  3 mL Intravenous Q12H   Continuous Infusions:  PRN Meds:.acetaminophen, cyclobenzaprine, hydrALAZINE, hydrOXYzine, LORazepam, LORazepam, oxyCODONE, oxyCODONE-acetaminophen Assessment/Plan: 53 y.o PMH seizures, stroke, HIV, HCV, pulmonary HTN, uncontrolled HTN, CKD, collapsing FSGS with DM GS, DM2, AIHA, anxiety, nephrotic syndrome, h/o RPR, HLD.  She presented to clinic 5/8 with fluid overload, 25 lb weight gain since  4/24, 3+ leg edema b/l and sob at rest.   #Fluid overload due to multifactorial etiology -will continue Lasix 160 mg bid with improved of lower ext edema, continue TED hose  -strict i/o, daily weights   #?seizure activity  -Neuro following, EEG today pending results  -Keppra 1500 mg bid, Dilantin 200 mg ER bid -will do Keppra level x 3 days per neurology, pending Dilantin level  -will need outpt neuro f/u  -prn Ativan 2 mg   #Unspecified essential hypertension -Uncontrolled HTN -Increased Labetalol 400 mg bid, Clonidine 0.3 tid, Lasix 160 iv bid, Hydralazine 100 mg tid, Lisinopril 40 mg qd -added prn Hydralazine for BP >180/100 -may have to transfer to CCM/ICU for BP control with Nicardipene gtt if continues  -monitor VS   #HIV disease -CD4 280 09/08/13, VL <20 -continue meds    #CKD (chronic kidney disease) stage 3, GFR 30-59 ml/min -trend CMET Cr. In the am BL close to 2  -d/c'ed sodium bicarbonate -Continue Lasix  -renal following   #Diabetes 2  -monitor cbg, Lantus 26 qhs, SSI-R, Novolog 15 units tid   #Autoimmune hemolytic anemia -Continue Prednisone. Hemoglobin stable   -Monitor CBC.   #Unspecified protein-calorie malnutrition  #F/E/N -NSL -BMET in the am  -Hypok replaced  -2 gram diet   #DVT px  -Heparin sq    Dispo: D/c likely 2-3 days   The patient does have a current PCP Christen Bame, MD) and does need an [facility] hospital follow-up appointment after discharge.  The patient does not have transportation limitations that hinder transportation to clinic appointments.  .Services Needed at time of discharge: Y = Yes, Blank = No PT:   OT:   RN:   Equipment:   Other:     LOS: 7 days   Annett Gula, MD [phone] 10/11/2013, 4:21 PM

## 2013-10-11 NOTE — Progress Notes (Signed)
Inpatient Diabetes Program Recommendations  AACE/ADA: New Consensus Statement on Inpatient Glycemic Control (2013)  Target Ranges:  Prepandial:   less than 140 mg/dL      Peak postprandial:   less than 180 mg/dL (1-2 hours)      Critically ill patients:  140 - 180 mg/dL   Results for DEAUNDRA, SCHRAG (MRN [id number]) as of 10/11/2013 08:08  Ref. Range 10/10/2013 08:24 10/10/2013 10:49 10/10/2013 12:10 10/10/2013 16:53 10/10/2013 20:41  Glucose-Capillary Latest Range: 70-99 mg/dL 944 (H) 967 (H) 591 (H) 292 (H) 298 (H)  Results for MAYRELI, PFUHL (MRN [id number]) as of 10/11/2013 08:08  Ref. Range 10/11/2013 05:50  Glucose Latest Range: 70-99 mg/dL 357 (H)   Diabetes history: DM2  Outpatient Diabetes medications: Lantus 15 units QHS, Novolog 10-25 units TID with meals  Current orders for Inpatient glycemic control: Lantus 20 units QHS, Novolog 0-20 units AC, Novolog 0-5 units HS, Novolog 15 units TID with meals for meal coverage  Inpatient Diabetes Program Recommendations Insulin - Meal Coverage: Please consider increasing Novolog meal coverage to 20 units TID with meals.  Thanks, Orlando Penner, RN, MSN, CCRN Diabetes Coordinator Inpatient Diabetes Program [phone] (Team Pager) [phone] ([facility] office) [phone] [facility] office)

## 2013-10-12 LAB — CBC
HEMATOCRIT: 27.2 % — AB (ref 36.0–46.0)
HEMOGLOBIN: 9 g/dL — AB (ref 12.0–15.0)
MCH: 36.1 pg — ABNORMAL HIGH (ref 26.0–34.0)
MCHC: 33.1 g/dL (ref 30.0–36.0)
MCV: 109.2 fL — ABNORMAL HIGH (ref 78.0–100.0)
Platelets: 144 10*3/uL — ABNORMAL LOW (ref 150–400)
RBC: 2.49 MIL/uL — ABNORMAL LOW (ref 3.87–5.11)
RDW: 13 % (ref 11.5–15.5)
WBC: 4.2 10*3/uL (ref 4.0–10.5)

## 2013-10-12 LAB — GLUCOSE, CAPILLARY
GLUCOSE-CAPILLARY: 183 mg/dL — AB (ref 70–99)
Glucose-Capillary: 163 mg/dL — ABNORMAL HIGH (ref 70–99)
Glucose-Capillary: 239 mg/dL — ABNORMAL HIGH (ref 70–99)

## 2013-10-12 LAB — BASIC METABOLIC PANEL
BUN: 53 mg/dL — ABNORMAL HIGH (ref 6–23)
CO2: 22 mEq/L (ref 19–32)
Calcium: 7.6 mg/dL — ABNORMAL LOW (ref 8.4–10.5)
Chloride: 102 mEq/L (ref 96–112)
Creatinine, Ser: 2.12 mg/dL — ABNORMAL HIGH (ref 0.50–1.10)
GFR calc non Af Amer: 25 mL/min — ABNORMAL LOW (ref >90)
GFR, EST AFRICAN AMERICAN: 29 mL/min — AB (ref >90)
Glucose, Bld: 192 mg/dL — ABNORMAL HIGH (ref 70–99)
Potassium: 4.1 mEq/L (ref 3.7–5.3)
Sodium: 142 mEq/L (ref 137–147)

## 2013-10-12 LAB — MAGNESIUM: Magnesium: 1.6 mg/dL (ref 1.5–2.5)

## 2013-10-12 LAB — PHENYTOIN LEVEL, TOTAL: Phenytoin Lvl: 10.1 ug/mL (ref 10.0–20.0)

## 2013-10-12 MED ORDER — INSULIN GLARGINE 100 UNIT/ML ~~LOC~~ SOLN
30.0000 [IU] | Freq: Every day | SUBCUTANEOUS | Status: DC
  Administered 2013-10-12 – 2013-10-13 (×2): 30 [IU] via SUBCUTANEOUS
  Filled 2013-10-12 (×3): qty 0.3

## 2013-10-12 MED ORDER — PHENYTOIN SODIUM EXTENDED 100 MG PO CAPS
400.0000 mg | ORAL_CAPSULE | Freq: Every day | ORAL | Status: DC
  Administered 2013-10-12 – 2013-10-13 (×2): 400 mg via ORAL
  Filled 2013-10-12 (×3): qty 4

## 2013-10-12 MED ORDER — PHENYTOIN SODIUM EXTENDED 30 MG PO CAPS: 330.0000 mg | ORAL_CAPSULE | Freq: Two times a day (BID) | ORAL | Status: DC

## 2013-10-12 MED ORDER — PHENYTOIN SODIUM EXTENDED 100 MG PO CAPS
100.0000 mg | ORAL_CAPSULE | Freq: Once | ORAL | Status: AC
  Administered 2013-10-12: 100 mg via ORAL
  Filled 2013-10-12: qty 1

## 2013-10-12 MED ORDER — LABETALOL HCL 300 MG PO TABS
600.0000 mg | ORAL_TABLET | Freq: Two times a day (BID) | ORAL | Status: DC
  Administered 2013-10-12 – 2013-10-14 (×4): 600 mg via ORAL
  Filled 2013-10-12 (×5): qty 2

## 2013-10-12 MED ORDER — PHENYTOIN SODIUM EXTENDED 100 MG PO CAPS
300.0000 mg | ORAL_CAPSULE | Freq: Every day | ORAL | Status: DC
  Administered 2013-10-13 – 2013-10-14 (×2): 300 mg via ORAL
  Filled 2013-10-12 (×2): qty 3

## 2013-10-12 NOTE — Progress Notes (Signed)
Patient refusing bed alarm at this time.  Educated patient on fall precautions- patient verbalized understanding.  Patient demonstrates appropriate use of call bell for assistance and bedside commode.

## 2013-10-12 NOTE — Progress Notes (Signed)
S: Swelling better.  Note issues with seizures O:BP 182/86  Pulse 76  Temp(Src) 97.8 F (36.6 C) (Oral)  Resp 18  Ht 5\' 5"  (1.651 m)  Wt 96.163 kg (212 lb)  BMI 35.28 kg/m2  SpO2 100%  Intake/Output Summary (Last 24 hours) at 10/12/13 0950 Last data filed at 10/12/13 [id number]  Gross per 24 hour  Intake   2280 ml  Output   3260 ml  Net   -980 ml   Weight change: 5.579 kg (12 lb 4.8 oz) RUE:AVWUJGen:Awake and alert CVS:RRR Resp: clear Abd:+ BS NTND Ext:1-2+ edema NEURO:CNI No asterixis Ox3   . abacavir  300 mg Oral BID  . aspirin EC  81 mg Oral Daily  . cholecalciferol  1,000 Units Oral Daily  . cloNIDine  0.3 mg Oral TID  . darunavir  800 mg Oral Q breakfast  . furosemide  160 mg Oral BID   Or  . furosemide  160 mg Intravenous BID  . heparin  5,000 Units Subcutaneous 3 times per day  . hydrALAZINE  100 mg Oral 3 times per day  . insulin aspart  0-20 Units Subcutaneous TID WC  . insulin aspart  0-5 Units Subcutaneous QHS  . insulin aspart  15 Units Subcutaneous TID WC  . insulin glargine  30 Units Subcutaneous QHS  . labetalol  400 mg Oral BID  . lamiVUDine  150 mg Oral Daily  . levETIRAcetam  1,500 mg Oral BID  . lisinopril  40 mg Oral Daily  . LORazepam  2 mg Intravenous Once  . LORazepam  2 mg Intramuscular Once  . LORazepam  1 mg Oral Daily  . pantoprazole  40 mg Oral Daily  . phenytoin  200 mg Oral BID  . potassium chloride  20 mEq Oral BID  . predniSONE  30 mg Oral Q breakfast  . ritonavir  100 mg Oral Q breakfast  . sodium chloride  3 mL Intravenous Q12H   No results found. BMET    Component Value Date/Time   NA 142 10/12/2013 0520   K 4.1 10/12/2013 0520   CL 102 10/12/2013 0520   CO2 22 10/12/2013 0520   GLUCOSE 192* 10/12/2013 0520   BUN 53* 10/12/2013 0520   CREATININE 2.12* 10/12/2013 0520   CREATININE 1.85* 10/04/2013 1440   CALCIUM 7.6* 10/12/2013 0520   GFRNONAA 25* 10/12/2013 0520   GFRNONAA 27* 08/19/2013 1618   GFRAA 29* 10/12/2013 0520   GFRAA 31*  08/19/2013 1618   CBC    Component Value Date/Time   WBC 3.8* 10/11/2013 0550   WBC 6.0 09/25/2013 1353   RBC 2.29* 10/11/2013 0550   RBC 2.21* 09/25/2013 1353   RBC 2.15* 09/09/2013 0600   HGB 8.1* 10/11/2013 0550   HGB 7.9* 09/25/2013 1353   HCT 24.8* 10/11/2013 0550   HCT 23.9* 09/25/2013 1353   PLT 141* 10/11/2013 0550   PLT 164 09/25/2013 1353   MCV 108.3* 10/11/2013 0550   MCV 108.1* 09/25/2013 1353   MCH 35.4* 10/11/2013 0550   MCH 35.7* 09/25/2013 1353   MCHC 32.7 10/11/2013 0550   MCHC 33.1 09/25/2013 1353   RDW 13.0 10/11/2013 0550   RDW 14.8* 09/25/2013 1353   LYMPHSABS 1.3 10/10/2013 0803   LYMPHSABS 0.6* 09/25/2013 1353   MONOABS 0.4 10/10/2013 0803   MONOABS 0.3 09/25/2013 1353   EOSABS 0.0 10/10/2013 0803   EOSABS 0.0 09/25/2013 1353   BASOSABS 0.0 10/10/2013 0803   BASOSABS 0.0 09/25/2013 1353  Assessment: 1. CKD3 sec FSGS and DM.  Renal fx stable  2. Volume overload 3. HTN 4. HIV 5. DM 6. Hx hemolytic anemia 7 Hypokalemia, resolved 8. Seizure Ds  Plan: 1. Dc KCL.  Give prn 2. increase labetalol to 600mg  BID  Dyke Maes

## 2013-10-12 NOTE — Progress Notes (Signed)
Subjective: Pt is ready to go home.  Her breathing is better.  Denies other complaints   Objective: Vital signs in last 24 hours: Filed Vitals:   10/12/13 0457 10/12/13 0900 10/12/13 1300 10/12/13 1713  BP: 182/86 180/70 212/83 182/78  Pulse: 76 81 74 75  Temp: 97.8 F (36.6 C) 98.5 F (36.9 C) 98.3 F (36.8 C) 98.1 F (36.7 C)  TempSrc: Oral Oral Oral Oral  Resp: 18 17 18 16   Height:      Weight:      SpO2: 100% 99% 97% 100%   Weight change: 12 lb 4.8 oz (5.579 kg)  Intake/Output Summary (Last 24 hours) at 10/12/13 1829 Last data filed at 10/12/13 1455  Gross per 24 hour  Intake   1800 ml  Output   3170 ml  Net  -1370 ml   Vitals reviewed. General: resting in bed, NAD HEENT:[place]/at, no scleral icterus Cardiac: RRR, no rubs, murmurs or gallops Pulm: ctab Abd: soft, nontender, nondistended, BS present Ext: warm and well perfused, 1+ to 2+ edema b/l lower ext much improved Neuro: alert and oriented X3, cranial nerves II-XII grossly intact, moving all 4 extremities   Lab Results: Basic Metabolic Panel:  Recent Labs Lab 10/11/13 0550 10/12/13 0520  NA 138 142  K 3.5* 4.1  CL 98 102  CO2 26 22  GLUCOSE 188* 192*  BUN 54* 53*  CREATININE 2.07* 2.12*  CALCIUM 7.6* 7.6*  PHOS 4.2  --    Liver Function Tests:  Recent Labs Lab 10/09/13 0330 10/11/13 0550  AST 78* 105*  ALT 109* 119*  ALKPHOS 259* 237*  BILITOT 0.4 0.5  PROT 5.5* 5.2*  ALBUMIN 2.0* 1.9*  1.9*   CBC:  Recent Labs Lab 10/07/13 0540  10/10/13 0803 10/11/13 0550 10/12/13 1138  WBC 4.8  < > 4.1 3.8* 4.2  NEUTROABS 2.9  --  2.3  --   --   HGB 7.8*  < > 8.3* 8.1* 9.0*  HCT 23.9*  < > 25.2* 24.8* 27.2*  MCV 110.1*  < > 107.2* 108.3* 109.2*  PLT 148*  < > 153 141* 144*  < > = values in this interval not displayed. CBG:  Recent Labs Lab 10/11/13 1155 10/11/13 1721 10/11/13 2018 10/12/13 0821 10/12/13 1147 10/12/13 1709  GLUCAP 238* 235* 346* 163* 183* 239*     Misc.  Labs: none  Micro Results: No results found for this or any previous visit (from the past 240 hour(s)). Studies/Results: No results found. Medications: Scheduled Meds: . abacavir  300 mg Oral BID  . aspirin EC  81 mg Oral Daily  . cholecalciferol  1,000 Units Oral Daily  . cloNIDine  0.3 mg Oral TID  . darunavir  800 mg Oral Q breakfast  . furosemide  160 mg Oral BID   Or  . furosemide  160 mg Intravenous BID  . heparin  5,000 Units Subcutaneous 3 times per day  . hydrALAZINE  100 mg Oral 3 times per day  . insulin aspart  0-20 Units Subcutaneous TID WC  . insulin aspart  0-5 Units Subcutaneous QHS  . insulin aspart  15 Units Subcutaneous TID WC  . insulin glargine  30 Units Subcutaneous QHS  . labetalol  600 mg Oral BID  . lamiVUDine  150 mg Oral Daily  . levETIRAcetam  1,500 mg Oral BID  . lisinopril  40 mg Oral Daily  . LORazepam  2 mg Intravenous Once  . LORazepam  2 mg  Intramuscular Once  . LORazepam  1 mg Oral Daily  . pantoprazole  40 mg Oral Daily  . [START ON 10/13/2013] phenytoin  300 mg Oral Daily  . phenytoin  400 mg Oral QHS  . predniSONE  30 mg Oral Q breakfast  . ritonavir  100 mg Oral Q breakfast  . sodium chloride  3 mL Intravenous Q12H   Continuous Infusions:  PRN Meds:.acetaminophen, cyclobenzaprine, hydrALAZINE, hydrOXYzine, LORazepam, LORazepam, oxyCODONE, oxyCODONE-acetaminophen Assessment/Plan: 53 y.o PMH seizures, stroke, HIV, HCV, pulmonary HTN, uncontrolled HTN, CKD, collapsing FSGS with DM GS, DM2, AIHA, anxiety, nephrotic syndrome, h/o RPR, HLD.  She presented to clinic 5/8 with fluid overload, 25 lb weight gain since 4/24, 3+ leg edema b/l and sob at rest.   #Fluid overload due to multifactorial etiology -will continue Lasix 160 mg bid po with improved of lower ext edema, continue TED hose  -strict i/o, daily weights   #seizure activity  -Neuro following, EEG + for potential epileptogenicity  -Keppra 1500 mg bid, Dilantin changed to 300 in  the am and 400 qpm -will do Keppra level monitoring per neurology -will need outpt neuro f/u  -prn Ativan 2 mg   #Unspecified essential hypertension -Uncontrolled HTN -Increased Labetalol 600 mg bid, Clonidine 0.3 tid, Lasix 160 po bid, Hydralazine 100 mg tid, Lisinopril 40 mg qd, prn Hydralazine for BP >180/100 -may have to transfer to CCM/ICU for BP control with Nicardipene gtt if continues  -monitor VS   #HIV disease -CD4 280 09/08/13, VL <20 -continue meds    #CKD (chronic kidney disease) stage 3, GFR 30-59 ml/min -trend CMET Cr. In the am BL close to 2  -Continue Lasix  -renal following   #Diabetes 2  -monitor cbg, Lantus 30 qhs, SSI-R, Novolog 15 units tid   #Autoimmune hemolytic anemia -Continue Prednisone. Hemoglobin stable   -Monitor CBC.   #Unspecified protein-calorie malnutrition  #F/E/N -NSL -CMET in the am  -2 gram diet   #DVT px  -Heparin sq    Dispo: D/c likely 2-3 days   The patient does have a current PCP Christen Bame, MD) and does need an [facility] hospital follow-up appointment after discharge.  The patient does not have transportation limitations that hinder transportation to clinic appointments.  .Services Needed at time of discharge: Y = Yes, Blank = No PT:   OT:   RN:   Equipment:   Other:     LOS: 8 days   Annett Gula, MD [phone] 10/12/2013, 6:29 PM

## 2013-10-12 NOTE — Progress Notes (Addendum)
Subjective: PAtient having no further seizures this am.   Exam: Filed Vitals:   10/12/13 0900  BP: 180/70  Pulse: 81  Temp: 98.5 F (36.9 C)  Resp: 17   Gen: In bed, NAD MS: awake, alert, answers questions, oriented. Much more appropriate this morning.  RE:VQWQV, EOMI Motor: no drift Sensory: symemtric to LT  Impression: 53 yo F HIV and seizures. Desmond Lope has been significant confusion regarding her AED doses. I do think that she was taking 300mg  BID of dilantin prior to admission and therefore needs an increase to this dose. I would favor increasing to 300mg  qam and 400mg  qpm. Also Her caregiver states taht she was taking 1000mg  of keppra THREE times per day for total 3gm per day. This could be given BID. This is a very large dose with her GFR, but levels on admission were appropriate.   Recommendations: 1) dilantin 300mg  qam  2) keppra at what I suspect was her home dose, 1500mg  BID  3) will need levels again soon.   Ritta Slot, MD [facility] [phone]  If 7pm- 7am, please page neurology on call as listed in AMION.

## 2013-10-13 LAB — COMPREHENSIVE METABOLIC PANEL
ALK PHOS: 241 U/L — AB (ref 39–117)
ALT: 128 U/L — AB (ref 0–35)
AST: 100 U/L — ABNORMAL HIGH (ref 0–37)
Albumin: 2 g/dL — ABNORMAL LOW (ref 3.5–5.2)
BUN: 52 mg/dL — ABNORMAL HIGH (ref 6–23)
CO2: 26 mEq/L (ref 19–32)
Calcium: 7.8 mg/dL — ABNORMAL LOW (ref 8.4–10.5)
Chloride: 99 mEq/L (ref 96–112)
Creatinine, Ser: 2.13 mg/dL — ABNORMAL HIGH (ref 0.50–1.10)
GFR calc non Af Amer: 25 mL/min — ABNORMAL LOW (ref >90)
GFR, EST AFRICAN AMERICAN: 29 mL/min — AB (ref >90)
GLUCOSE: 205 mg/dL — AB (ref 70–99)
POTASSIUM: 3.9 meq/L (ref 3.7–5.3)
Sodium: 140 mEq/L (ref 137–147)
Total Bilirubin: 0.6 mg/dL (ref 0.3–1.2)
Total Protein: 5.6 g/dL — ABNORMAL LOW (ref 6.0–8.3)

## 2013-10-13 LAB — CBC WITH DIFFERENTIAL/PLATELET
BASOS ABS: 0 10*3/uL (ref 0.0–0.1)
Basophils Relative: 0 % (ref 0–1)
EOS ABS: 0 10*3/uL (ref 0.0–0.7)
EOS PCT: 0 % (ref 0–5)
HCT: 26.6 % — ABNORMAL LOW (ref 36.0–46.0)
HEMOGLOBIN: 8.8 g/dL — AB (ref 12.0–15.0)
Lymphocytes Relative: 30 % (ref 12–46)
Lymphs Abs: 1.2 10*3/uL (ref 0.7–4.0)
MCH: 36.5 pg — AB (ref 26.0–34.0)
MCHC: 33.1 g/dL (ref 30.0–36.0)
MCV: 110.4 fL — AB (ref 78.0–100.0)
MONO ABS: 0.5 10*3/uL (ref 0.1–1.0)
MONOS PCT: 11 % (ref 3–12)
NEUTROS ABS: 2.4 10*3/uL (ref 1.7–7.7)
Neutrophils Relative %: 59 % (ref 43–77)
Platelets: 132 10*3/uL — ABNORMAL LOW (ref 150–400)
RBC: 2.41 MIL/uL — ABNORMAL LOW (ref 3.87–5.11)
RDW: 12.9 % (ref 11.5–15.5)
WBC: 4 10*3/uL (ref 4.0–10.5)

## 2013-10-13 LAB — GLUCOSE, CAPILLARY
GLUCOSE-CAPILLARY: 209 mg/dL — AB (ref 70–99)
GLUCOSE-CAPILLARY: 310 mg/dL — AB (ref 70–99)
Glucose-Capillary: 223 mg/dL — ABNORMAL HIGH (ref 70–99)
Glucose-Capillary: 136 mg/dL — ABNORMAL HIGH (ref 70–99)
Glucose-Capillary: 243 mg/dL — ABNORMAL HIGH (ref 70–99)

## 2013-10-13 NOTE — Progress Notes (Signed)
Pt. refused bed alarm this time. Educated on fall precautions, patient verbalized understanding and demonstrates appropriate use of call bell for assistance and bedside commode.  Abram Sander, RN

## 2013-10-13 NOTE — Progress Notes (Signed)
Subjective: PAtient having no further seizures this am.   Exam: Filed Vitals:   10/13/13 1700  BP: 170/96  Pulse: 73  Temp: 98.5 F (36.9 C)  Resp: 21   Gen: In bed, NAD MS: awake, alert, answers questions, oriented. Much more appropriate this morning.  MB:PJPET, EOMI Motor: no drift Sensory: symemtric to LT  Impression: 53 yo F HIV and seizures. Desmond Lope has been significant confusion regarding her AED doses. I do think that she was taking 300mg  BID of dilantin prior to admission and therefore needs an increase to this dose. I would favor increasing to 300mg  qam and 400mg  qpm. Also Her caregiver states taht she was taking 1000mg  of keppra THREE times per day for total 3gm per day. This could be given BID. This is a very large dose with her GFR, but levels on admission were appropriate.   Recommendations: 1) dilantin 300mg  qam, 400mg  qpm 2) keppra at what I suspect was her home dose, 1500mg  BID  3) will need levels again soon.   Ritta Slot, MD [facility] [phone]  If 7pm- 7am, please page neurology on call as listed in AMION.

## 2013-10-13 NOTE — Progress Notes (Signed)
S: Swelling better.  Note issues with seizures O:BP 175/78  Pulse 70  Temp(Src) 98.6 F (37 C) (Oral)  Resp 17  Ht 5\' 5"  (1.651 m)  Wt 93.8 kg (206 lb 12.7 oz)  BMI 34.41 kg/m2  SpO2 100%  Intake/Output Summary (Last 24 hours) at 10/13/13 0937 Last data filed at 10/13/13 0644  Gross per 24 hour  Intake   1200 ml  Output   3200 ml  Net  -2000 ml   Weight change: -2.268 kg (-5 lb) TXM:IWOEH and alert CVS:RRR Resp: clear Abd:+ BS NTND Ext:1-2+ edema NEURO:CNI No asterixis Ox3   . abacavir  300 mg Oral BID  . aspirin EC  81 mg Oral Daily  . cholecalciferol  1,000 Units Oral Daily  . cloNIDine  0.3 mg Oral TID  . darunavir  800 mg Oral Q breakfast  . furosemide  160 mg Oral BID   Or  . furosemide  160 mg Intravenous BID  . heparin  5,000 Units Subcutaneous 3 times per day  . hydrALAZINE  100 mg Oral 3 times per day  . insulin aspart  0-20 Units Subcutaneous TID WC  . insulin aspart  0-5 Units Subcutaneous QHS  . insulin aspart  15 Units Subcutaneous TID WC  . insulin glargine  30 Units Subcutaneous QHS  . labetalol  600 mg Oral BID  . lamiVUDine  150 mg Oral Daily  . levETIRAcetam  1,500 mg Oral BID  . lisinopril  40 mg Oral Daily  . LORazepam  2 mg Intravenous Once  . LORazepam  2 mg Intramuscular Once  . LORazepam  1 mg Oral Daily  . pantoprazole  40 mg Oral Daily  . phenytoin  300 mg Oral Daily  . phenytoin  400 mg Oral QHS  . predniSONE  30 mg Oral Q breakfast  . ritonavir  100 mg Oral Q breakfast  . sodium chloride  3 mL Intravenous Q12H   No results found. BMET    Component Value Date/Time   NA 140 10/13/2013 0655   K 3.9 10/13/2013 0655   CL 99 10/13/2013 0655   CO2 26 10/13/2013 0655   GLUCOSE 205* 10/13/2013 0655   BUN 52* 10/13/2013 0655   CREATININE 2.13* 10/13/2013 0655   CREATININE 1.85* 10/04/2013 1440   CALCIUM 7.8* 10/13/2013 0655   GFRNONAA 25* 10/13/2013 0655   GFRNONAA 27* 08/19/2013 1618   GFRAA 29* 10/13/2013 0655   GFRAA 31* 08/19/2013 1618    CBC    Component Value Date/Time   WBC 4.0 10/13/2013 0655   WBC 6.0 09/25/2013 1353   RBC 2.41* 10/13/2013 0655   RBC 2.21* 09/25/2013 1353   RBC 2.15* 09/09/2013 0600   HGB 8.8* 10/13/2013 0655   HGB 7.9* 09/25/2013 1353   HCT 26.6* 10/13/2013 0655   HCT 23.9* 09/25/2013 1353   PLT 132* 10/13/2013 0655   PLT 164 09/25/2013 1353   MCV 110.4* 10/13/2013 0655   MCV 108.1* 09/25/2013 1353   MCH 36.5* 10/13/2013 0655   MCH 35.7* 09/25/2013 1353   MCHC 33.1 10/13/2013 0655   MCHC 33.1 09/25/2013 1353   RDW 12.9 10/13/2013 0655   RDW 14.8* 09/25/2013 1353   LYMPHSABS 1.2 10/13/2013 0655   LYMPHSABS 0.6* 09/25/2013 1353   MONOABS 0.5 10/13/2013 0655   MONOABS 0.3 09/25/2013 1353   EOSABS 0.0 10/13/2013 0655   EOSABS 0.0 09/25/2013 1353   BASOSABS 0.0 10/13/2013 0655   BASOSABS 0.0 09/25/2013 1353     Assessment:  1. CKD3 sec FSGS and DM.  Renal fx stable  2. Volume overload, improving 3. HTN 4. HIV 5. DM 6. Hx hemolytic anemia 7 Hypokalemia, resolved 8. Seizure Ds  Plan: 1. Cont diuretics 2. Daily labs  Dyke MaesMichael T Briante Loveall

## 2013-10-13 NOTE — Progress Notes (Signed)
    Day 9 of stay      Patient name: Michelle [name]  Medical record number: [id number]  Date of birth: [birth year]  Feels improved. Breathing better. \  Filed Vitals:   10/13/13 0401 10/13/13 0500 10/13/13 0900 10/13/13 1300  BP: 175/78  191/77 136/71  Pulse: 70  91 83  Temp: 98.6 F (37 C)   99.2 F (37.3 C)  TempSrc: Oral   Oral  Resp: 17  18 20   Height:      Weight:  206 lb 12.7 oz (93.8 kg)    SpO2: 100%  98% 95%    Physical Exam  General: Resting in bed. No acute distress.  HEENT: PERRL, EOMI, no scleral icterus. Heart: RRR, no rubs, murmurs or gallops. Lungs: Clear to auscultation bilaterally, no wheezes, rales, or rhonchi. Abdomen: Soft, nontender, nondistended, BS present. Extremities: Warm, 2+ edema bilateral LE, 1+ edema bilateral RUE - . Neuro: Alert and oriented X3.   Recent Labs Lab 10/11/13 0550 10/12/13 1138 10/13/13 0655  HGB 8.1* 9.0* 8.8*  HCT 24.8* 27.2* 26.6*  WBC 3.8* 4.2 4.0  PLT 141* 144* 132*    Recent Labs Lab 10/09/13 0330 10/10/13 0803 10/11/13 0550 10/12/13 0520 10/12/13 2130 10/13/13 0655  NA 137 141 138 142  --  140  K 4.0 3.4* 3.5* 4.1  --  3.9  CL 97 99 98 102  --  99  CO2 24 26 26 22   --  26  GLUCOSE 303* 114* 188* 192*  --  205*  BUN 50* 51* 54* 53*  --  52*  CREATININE 2.05* 2.01* 2.07* 2.12*  --  2.13*  CALCIUM 7.2* 7.8* 7.6* 7.6*  --  7.8*  MG  --   --   --   --  1.6  --   PHOS  --   --  4.2  --   --   --     Assessment and Plan   CHF - Improving, we continue to diurese. Output has been >10 litres this admission.  CKD - continue to monitor, nephro rec appreciated.  RUE edema - doppler to rule out DVT.    I have discussed the care of this patient with my IM team residents. Please see the resident note for details.  Tephanie Escorcia 10/13/2013, 4:16 PM.

## 2013-10-13 NOTE — Progress Notes (Signed)
Subjective:  Feeling better.  Breathing is better, denies sob, abdominal pain.  RUE still with swelling, leg swelling improved. She has had bowel movement.   Objective: Vital signs in last 24 hours: Filed Vitals:   10/12/13 2142 10/12/13 2216 10/13/13 0401 10/13/13 0500  BP: 164/78 182/78 175/78   Pulse: 75  70   Temp: 98 F (36.7 C)  98.6 F (37 C)   TempSrc: Oral  Oral   Resp: 18  17   Height:      Weight: 207 lb (93.895 kg)   206 lb 12.7 oz (93.8 kg)  SpO2: 97%  100%    Weight change: -5 lb (-2.268 kg)  Intake/Output Summary (Last 24 hours) at 10/13/13 0904 Last data filed at 10/13/13 0644  Gross per 24 hour  Intake   1440 ml  Output   3210 ml  Net  -1770 ml   Vitals reviewed. General: resting in bed, NAD HEENT:[place]/at, no scleral icterus Cardiac: RRR, no rubs, murmurs or gallops Pulm: ctab Abd: soft, nontender, nondistended, BS present Ext: warm and well perfused, 1+ to 2+ edema b/l lower ext much improved; right upper ext with 2+ edema  Neuro: alert and oriented X3, cranial nerves II-XII grossly intact, moving all 4 extremities   Lab Results: Basic Metabolic Panel:  Recent Labs Lab 10/11/13 0550 10/12/13 0520 10/12/13 2130 10/13/13 0655  NA 138 142  --  140  K 3.5* 4.1  --  3.9  CL 98 102  --  99  CO2 26 22  --  26  GLUCOSE 188* 192*  --  205*  BUN 54* 53*  --  52*  CREATININE 2.07* 2.12*  --  2.13*  CALCIUM 7.6* 7.6*  --  7.8*  MG  --   --  1.6  --   PHOS 4.2  --   --   --    Liver Function Tests:  Recent Labs Lab 10/11/13 0550 10/13/13 0655  AST 105* 100*  ALT 119* 128*  ALKPHOS 237* 241*  BILITOT 0.5 0.6  PROT 5.2* 5.6*  ALBUMIN 1.9*  1.9* 2.0*   CBC:  Recent Labs Lab 10/10/13 0803  10/12/13 1138 10/13/13 0655  WBC 4.1  < > 4.2 4.0  NEUTROABS 2.3  --   --  2.4  HGB 8.3*  < > 9.0* 8.8*  HCT 25.2*  < > 27.2* 26.6*  MCV 107.2*  < > 109.2* 110.4*  PLT 153  < > 144* 132*  < > = values in this interval not  displayed. CBG:  Recent Labs Lab 10/11/13 2018 10/12/13 0821 10/12/13 1147 10/12/13 1709 10/12/13 2142 10/13/13 0830  GLUCAP 346* 163* 183* 239* 209* 223*     Misc. Labs: none Studies/Results: No results found. Medications: Scheduled Meds: . abacavir  300 mg Oral BID  . aspirin EC  81 mg Oral Daily  . cholecalciferol  1,000 Units Oral Daily  . cloNIDine  0.3 mg Oral TID  . darunavir  800 mg Oral Q breakfast  . furosemide  160 mg Oral BID   Or  . furosemide  160 mg Intravenous BID  . heparin  5,000 Units Subcutaneous 3 times per day  . hydrALAZINE  100 mg Oral 3 times per day  . insulin aspart  0-20 Units Subcutaneous TID WC  . insulin aspart  0-5 Units Subcutaneous QHS  . insulin aspart  15 Units Subcutaneous TID WC  . insulin glargine  30 Units Subcutaneous QHS  . labetalol  600 mg Oral BID  . lamiVUDine  150 mg Oral Daily  . levETIRAcetam  1,500 mg Oral BID  . lisinopril  40 mg Oral Daily  . LORazepam  2 mg Intravenous Once  . LORazepam  2 mg Intramuscular Once  . LORazepam  1 mg Oral Daily  . pantoprazole  40 mg Oral Daily  . phenytoin  300 mg Oral Daily  . phenytoin  400 mg Oral QHS  . predniSONE  30 mg Oral Q breakfast  . ritonavir  100 mg Oral Q breakfast  . sodium chloride  3 mL Intravenous Q12H   Continuous Infusions:  PRN Meds:.acetaminophen, cyclobenzaprine, hydrALAZINE, hydrOXYzine, LORazepam, LORazepam, oxyCODONE, oxyCODONE-acetaminophen Assessment/Plan: 53 y.o PMH seizures, stroke, HIV, HCV, pulmonary HTN, uncontrolled HTN, CKD, collapsing FSGS with DM GS, DM2, AIHA, anxiety, nephrotic syndrome, h/o RPR, HLD.  She presented to clinic 5/8 with fluid overload, 25 lb weight gain since 4/24, 3+ leg edema b/l and sob at rest.   #Fluid overload due to multifactorial etiology -will continue Lasix 160 mg bid po with improved of lower ext edema, continue TED hose, elevation -strict i/o, daily weights   #seizure activity  -Neuro following, EEG + for  potential epileptogenicity  -Keppra 1500 mg bid, Dilantin changed to 300 in the am and 400 qpm -will do Keppra level monitoring per neurology -will need outpt neuro f/u  -prn Ativan 2 mg   #Unspecified essential hypertension -Uncontrolled HTN -Increased Labetalol 600 mg bid, Clonidine 0.3 tid, Lasix 160 po bid, Hydralazine 100 mg tid, Lisinopril 40 mg qd, prn Hydralazine for BP >180/100 -may have to transfer to CCM/ICU for BP control with Nicardipene gtt if continues  -monitor VS   #HIV disease -CD4 280 09/08/13, VL <20. continue meds    #CKD (chronic kidney disease) stage 3, GFR 30-59 ml/min -trend Cr. BL close to 2  -Continue Lasix  -renal following   #Diabetes 2  -monitor cbg, Lantus 30 qhs, SSI-R, Novolog 15 units tid   #Autoimmune hemolytic anemia -Continue Prednisone. Hemoglobin stable   -Monitor CBC.   #Elevated lfts -Could be elevated 2/2 HCV -will need outpatient with GI for treatment   #Unspecified protein-calorie malnutrition  #F/E/N -NSL -trend BMET -2 gram diet   #DVT px  -Heparin sq    Dispo: D/c likely 2-3 days   The patient does have a current PCP Christen Bame(Nora Sadek, MD) and [facility] after discharge.  The patient does not have transportation limitations that hinder transportation to clinic appointments.  .Services Needed at time of discharge: Y = Yes, Blank = No PT:   OT:   RN:   Equipment:   Other:     LOS: 9 days   Annett Gularacy N McLean, MD [phone] 10/13/2013, 9:04 AM

## 2013-10-14 ENCOUNTER — Other Ambulatory Visit (HOSPITAL_COMMUNITY): Payer: Self-pay | Admitting: Internal Medicine

## 2013-10-14 DIAGNOSIS — R609 Edema, unspecified: Secondary | ICD-10-CM

## 2013-10-14 DIAGNOSIS — R7309 Other abnormal glucose: Secondary | ICD-10-CM

## 2013-10-14 LAB — CBC WITH DIFFERENTIAL/PLATELET
BASOS PCT: 0 % (ref 0–1)
Basophils Absolute: 0 10*3/uL (ref 0.0–0.1)
EOS PCT: 0 % (ref 0–5)
Eosinophils Absolute: 0 10*3/uL (ref 0.0–0.7)
HCT: 26.2 % — ABNORMAL LOW (ref 36.0–46.0)
Hemoglobin: 8.6 g/dL — ABNORMAL LOW (ref 12.0–15.0)
LYMPHS ABS: 1.2 10*3/uL (ref 0.7–4.0)
Lymphocytes Relative: 26 % (ref 12–46)
MCH: 36.3 pg — ABNORMAL HIGH (ref 26.0–34.0)
MCHC: 32.8 g/dL (ref 30.0–36.0)
MCV: 110.5 fL — ABNORMAL HIGH (ref 78.0–100.0)
MONO ABS: 0.6 10*3/uL (ref 0.1–1.0)
MONOS PCT: 12 % (ref 3–12)
Neutro Abs: 2.8 10*3/uL (ref 1.7–7.7)
Neutrophils Relative %: 62 % (ref 43–77)
PLATELETS: 135 10*3/uL — AB (ref 150–400)
RBC: 2.37 MIL/uL — AB (ref 3.87–5.11)
RDW: 13 % (ref 11.5–15.5)
WBC: 4.6 10*3/uL (ref 4.0–10.5)

## 2013-10-14 LAB — RENAL FUNCTION PANEL
ALBUMIN: 2 g/dL — AB (ref 3.5–5.2)
BUN: 54 mg/dL — ABNORMAL HIGH (ref 6–23)
CO2: 25 mEq/L (ref 19–32)
CREATININE: 2.13 mg/dL — AB (ref 0.50–1.10)
Calcium: 7.8 mg/dL — ABNORMAL LOW (ref 8.4–10.5)
Chloride: 97 mEq/L (ref 96–112)
GFR calc Af Amer: 29 mL/min — ABNORMAL LOW (ref >90)
GFR, EST NON AFRICAN AMERICAN: 25 mL/min — AB (ref >90)
Glucose, Bld: 140 mg/dL — ABNORMAL HIGH (ref 70–99)
POTASSIUM: 3.9 meq/L (ref 3.7–5.3)
Phosphorus: 4.7 mg/dL — ABNORMAL HIGH (ref 2.3–4.6)
Sodium: 137 mEq/L (ref 137–147)

## 2013-10-14 LAB — PHENYTOIN LEVEL, TOTAL: PHENYTOIN LVL: 15.1 ug/mL (ref 10.0–20.0)

## 2013-10-14 LAB — GLUCOSE, CAPILLARY
Glucose-Capillary: 101 mg/dL — ABNORMAL HIGH (ref 70–99)
Glucose-Capillary: 244 mg/dL — ABNORMAL HIGH (ref 70–99)
Glucose-Capillary: 411 mg/dL — ABNORMAL HIGH (ref 70–99)

## 2013-10-14 LAB — MAGNESIUM: MAGNESIUM: 1.7 mg/dL (ref 1.5–2.5)

## 2013-10-14 MED ORDER — PHENYTOIN SODIUM EXTENDED 300 MG PO CAPS: 300.0000 mg | ORAL_CAPSULE | Freq: Every day | ORAL | Status: DC

## 2013-10-14 MED ORDER — POTASSIUM CHLORIDE CRYS ER 10 MEQ PO TBCR: 10.0000 meq | EXTENDED_RELEASE_TABLET | Freq: Every day | ORAL | Status: DC

## 2013-10-14 MED ORDER — HYDRALAZINE HCL 50 MG PO TABS: 100.0000 mg | ORAL_TABLET | Freq: Three times a day (TID) | ORAL | Status: DC

## 2013-10-14 MED ORDER — INSULIN ASPART 100 UNIT/ML ~~LOC~~ SOLN: 7.0000 [IU] | Freq: Three times a day (TID) | SUBCUTANEOUS | Status: DC

## 2013-10-14 MED ORDER — LEVETIRACETAM 500 MG PO TABS: 1500.0000 mg | ORAL_TABLET | Freq: Two times a day (BID) | ORAL | Status: DC

## 2013-10-14 MED ORDER — POTASSIUM CHLORIDE CRYS ER 10 MEQ PO TBCR
10.0000 meq | EXTENDED_RELEASE_TABLET | Freq: Every day | ORAL | Status: DC
  Administered 2013-10-14: 10 meq via ORAL
  Filled 2013-10-14: qty 1

## 2013-10-14 MED ORDER — FUROSEMIDE 80 MG PO TABS: 80.0000 mg | ORAL_TABLET | Freq: Two times a day (BID) | ORAL | Status: DC

## 2013-10-14 MED ORDER — PHENYTOIN SODIUM EXTENDED 30 MG PO CAPS: 30.0000 mg | ORAL_CAPSULE | Freq: Every day | ORAL | Status: DC

## 2013-10-14 MED ORDER — PREDNISONE 20 MG PO TABS: 30.0000 mg | ORAL_TABLET | Freq: Every day | ORAL | Status: DC

## 2013-10-14 MED ORDER — PHENYTOIN SODIUM EXTENDED 100 MG PO CAPS
350.0000 mg | ORAL_CAPSULE | Freq: Every day | ORAL | Status: DC
  Filled 2013-10-14: qty 5

## 2013-10-14 MED ORDER — CLONIDINE HCL 0.3 MG PO TABS: 0.3000 mg | ORAL_TABLET | Freq: Three times a day (TID) | ORAL | Status: DC

## 2013-10-14 MED ORDER — INSULIN GLARGINE 100 UNIT/ML ~~LOC~~ SOLN: 30.0000 [IU] | Freq: Every day | SUBCUTANEOUS | Status: DC

## 2013-10-14 MED ORDER — LABETALOL HCL 300 MG PO TABS: 600.0000 mg | ORAL_TABLET | Freq: Two times a day (BID) | ORAL | Status: DC

## 2013-10-14 NOTE — Care Management Note (Signed)
CARE MANAGEMENT NOTE 10/14/2013  Patient:  Michelle [name], Michelle [name]   Account Number:  [id number]  Date Initiated:  10/08/2013  Documentation initiated by:  Jasmine Pang  Subjective/Objective Assessment:   Consult for [facility] referral  Orders for [facility] and [place] aide.     Action/Plan:   Calls placed to [facility] await return call.  10/08/13 Met with pt re [place] and aide. Pt has used [facility] for [facility] services and has [place] in place with an aide for 2 hr per day.   Additional aide services are not appropriate.   Anticipated DC Date:  10/14/2013   Anticipated DC Plan:  [place]         [facility] Choice  HOME HEALTH   Choice offered to / List presented to:          [facility] arranged  HH-1 RN      [place].   Status of service:  ACYCLOVIR Medicare Important Message given?   (If response is "NO", the following Medicare IM given date fields will be blank) Date Medicare IM given:   Date Additional Medicare IM given:    Discharge Disposition:  [place]  Per UR Regulation:    If discussed at Long Length of Stay Meetings, dates discussed:    Comments:  10/10/2013 spoke to Maudie Mercury of [facility] and pt is active with that agency , and being followed at home by a case manager. CRoyal RN MPH, case manager, [phone]  10/08/2013 Call placed to [facility] office for referral of this pt, await return call. Call place to ED [facility] rep re this pt eligiblity for [facility]. Jasmine Pang RN MPH, case manager, [phone] addem: Noted orders for [facility] and aide, met with pt who has personal care services in place already with an aide 2hr per day , therefore a [place] aide is not appropriate. Pt selected [facility] for [facility] services. [place] notified. Jasmine Pang RN MPH, [phone], case manager.

## 2013-10-14 NOTE — Discharge Instructions (Signed)
Please keep your follow-up appointments; this is very important for your continued recovery.    We have made the following changes to your medications:  Please take dilantin 300mg  in the morning and 330mg  in the evening.  You will need to have your level checked this Friday, May 22 in the internal medicine clinic.  Please continue to take all of your medications that are listed with these instructions.   Please continue to take all of your medications as prescribed.  Do not miss any doses without contacting your primary physician.  If you have questions, please contact your doctor.    Please bring your medicications with you to your appointments; medicications may be eye drops, herbals, vitamins, or pills.    If you believe you are having a life-threatening emergency, go to the nearest [facility] Department.

## 2013-10-14 NOTE — Progress Notes (Signed)
Subjective:  No new complaints " i want to go home" weight is down about 6 kg since admission Objective Vital signs in last 24 hours: Filed Vitals:   10/13/13 1700 10/13/13 2051 10/14/13 0508 10/14/13 0831  BP: 170/96 182/84 191/99 179/91  Pulse: 73 80 72 74  Temp: 98.5 F (36.9 C) 98.5 F (36.9 C) 97.7 F (36.5 C) 98.3 F (36.8 C)  TempSrc: Oral Oral Oral Oral  Resp: 21 20 20 20   Height:  5\' 5"  (1.651 m)    Weight:  94.802 kg (209 lb)    SpO2: 96% 96% 97% 95%   Weight change: 0.907 kg (2 lb)  Intake/Output Summary (Last 24 hours) at 10/14/13 1233 Last data filed at 10/14/13 1024  Gross per 24 hour  Intake    840 ml  Output   2400 ml  Net  -1560 ml    Assessment/ Plan: Pt is a 53 y.o. yo female who was admitted on 10/04/2013 with volume overload which is not new for her but hospitalization complicated by seizure Assessment/Plan: 1. Renal- stable renal function- has issues with compliance of meds and I assume diet as well.  She actually just saw her primary nephrologist Dr. Marisue Humble this month and is not due to be seen again until August.  I see no reason for her to have an extra follow up appt.   2. HTN/vol-  Probably needs more lasix as OP- would send her out on lasix 80 BID which is double her current OP dosing 3. Anemia- due to CKD and multiple hosp- will give her aranesp here times one and can assess for further need as OP 4. Hypokalemia- resolved- would send her out on kdur 10 meq per day 5. Dispo- I have no issue for diascharge from my standpoint, other issues per primary- renal will sign off, call with questions    Cecille Aver    Labs: Basic Metabolic Panel:  Recent Labs Lab 10/11/13 0550 10/12/13 0520 10/13/13 0655 10/14/13 0615  NA 138 142 140 137  K 3.5* 4.1 3.9 3.9  CL 98 102 99 97  CO2 26 22 26 25   GLUCOSE 188* 192* 205* 140*  BUN 54* 53* 52* 54*  CREATININE 2.07* 2.12* 2.13* 2.13*  CALCIUM 7.6* 7.6* 7.8* 7.8*  PHOS 4.2  --   --  4.7*    Liver Function Tests:  Recent Labs Lab 10/09/13 0330 10/11/13 0550 10/13/13 0655 10/14/13 0615  AST 78* 105* 100*  --   ALT 109* 119* 128*  --   ALKPHOS 259* 237* 241*  --   BILITOT 0.4 0.5 0.6  --   PROT 5.5* 5.2* 5.6*  --   ALBUMIN 2.0* 1.9*  1.9* 2.0* 2.0*   No results found for this basename: LIPASE, AMYLASE,  in the last 168 hours No results found for this basename: AMMONIA,  in the last 168 hours CBC:  Recent Labs Lab 10/10/13 0803 10/11/13 0550 10/12/13 1138 10/13/13 0655 10/14/13 0615  WBC 4.1 3.8* 4.2 4.0 4.6  NEUTROABS 2.3  --   --  2.4 2.8  HGB 8.3* 8.1* 9.0* 8.8* 8.6*  HCT 25.2* 24.8* 27.2* 26.6* 26.2*  MCV 107.2* 108.3* 109.2* 110.4* 110.5*  PLT 153 141* 144* 132* 135*   Cardiac Enzymes: No results found for this basename: CKTOTAL, CKMB, CKMBINDEX, TROPONINI,  in the last 168 hours CBG:  Recent Labs Lab 10/13/13 1226 10/13/13 1710 10/13/13 2049 10/14/13 0829 10/14/13 1152  GLUCAP 136* 243* 310* 101* 244*  Iron Studies: No results found for this basename: IRON, TIBC, TRANSFERRIN, FERRITIN,  in the last 72 hours Studies/Results: No results found. Medications: Infusions:    Scheduled Medications: . abacavir  300 mg Oral BID  . aspirin EC  81 mg Oral Daily  . cholecalciferol  1,000 Units Oral Daily  . cloNIDine  0.3 mg Oral TID  . darunavir  800 mg Oral Q breakfast  . furosemide  160 mg Oral BID   Or  . furosemide  160 mg Intravenous BID  . heparin  5,000 Units Subcutaneous 3 times per day  . hydrALAZINE  100 mg Oral 3 times per day  . insulin aspart  0-20 Units Subcutaneous TID WC  . insulin aspart  0-5 Units Subcutaneous QHS  . insulin glargine  30 Units Subcutaneous QHS  . labetalol  600 mg Oral BID  . lamiVUDine  150 mg Oral Daily  . levETIRAcetam  1,500 mg Oral BID  . lisinopril  40 mg Oral Daily  . LORazepam  2 mg Intravenous Once  . LORazepam  2 mg Intramuscular Once  . LORazepam  1 mg Oral Daily  . pantoprazole  40 mg  Oral Daily  . phenytoin  300 mg Oral Daily  . phenytoin  400 mg Oral QHS  . predniSONE  30 mg Oral Q breakfast  . ritonavir  100 mg Oral Q breakfast  . sodium chloride  3 mL Intravenous Q12H    have reviewed scheduled and prn medications.  Physical Exam: General: NAD Heart: RRR Lungs: mostly clear Abdomen: soft, non tender Extremities: some pitting edema but is better    10/14/2013,12:33 PM  LOS: 10 days

## 2013-10-14 NOTE — Progress Notes (Signed)
Right upper extremity venous duplex:  No evidence of DVT or superficial thrombosis.    

## 2013-10-14 NOTE — Progress Notes (Signed)
Patient discharge teaching given, including activity, diet, follow-up appoints, and medications. Patient verbalized understanding of all discharge instructions. IV access was d/c'd. Vitals are stable. Skin is intact except as charted in most recent assessments. Pt to be escorted out by NT, to be driven home by family.  Surah Pelley, MBA, BS, RN 

## 2013-10-14 NOTE — Progress Notes (Signed)
Pt. Stated that she fell back in January, 2015.  Pt. Refusing to have bed alarm activated.  Pt. Stated that she would call when she needs to get up.  Peri Maris, MBA, BS, RN

## 2013-10-14 NOTE — Progress Notes (Signed)
Subjective:  VSS.  Down 11.6L since admission; weight down from 222 -->209 lbs since admission.  Pt went down for RUE doppler which showed no UEDVT.  Her breathing has improved since admission and she wants to go home today.  She has experienced no seizures for the past 3 days.   Objective:  Vital signs in last 24 hours: Filed Vitals:   10/13/13 1700 10/13/13 2051 10/14/13 0508 10/14/13 0831  BP: 170/96 182/84 191/99 179/91  Pulse: 73 80 72 74  Temp: 98.5 F (36.9 C) 98.5 F (36.9 C) 97.7 F (36.5 C) 98.3 F (36.8 C)  TempSrc: Oral Oral Oral Oral  Resp: 21 20 20 20   Height:  5\' 5"  (1.651 m)    Weight:  209 lb (94.802 kg)    SpO2: 96% 96% 97% 95%   Weight change: 2 lb (0.907 kg)  Intake/Output Summary (Last 24 hours) at 10/14/13 1230 Last data filed at 10/14/13 1024  Gross per 24 hour  Intake    840 ml  Output   2400 ml  Net  -1560 ml   -->down1.68L yesterday  Physical Exam: Vitals reviewed. General: Sitting in bed watching TV.  HEENT: NCAT, EOMI, no scleral icterus Cardiac: RRR, no rubs, murmurs or gallops Pulm: normal work of breathing, CTAB Abd: obese; soft, nontender, mildly distended, BS present Ext: warm and well perfused, 2+ edema b/l LE to knees, RUE 2+ edema Neuro: alert and oriented X3, cranial nerves II-XII grossly intact, moving all 4 extremities   Lab Results:  Basic Metabolic Panel:  Recent Labs Lab 10/11/13 0550  10/12/13 2130 10/13/13 0655 10/14/13 0615  NA 138  < >  --  140 137  K 3.5*  < >  --  3.9 3.9  CL 98  < >  --  99 97  CO2 26  < >  --  26 25  GLUCOSE 188*  < >  --  205* 140*  BUN 54*  < >  --  52* 54*  CREATININE 2.07*  < >  --  2.13* 2.13*  CALCIUM 7.6*  < >  --  7.8* 7.8*  MG  --   --  1.6  --  1.7  PHOS 4.2  --   --   --  4.7*  < > = values in this interval not displayed. Anion gap:   Liver Function Tests:  Recent Labs Lab 10/11/13 0550 10/13/13 0655 10/14/13 0615  AST 105* 100*  --   ALT 119* 128*  --   ALKPHOS  237* 241*  --   BILITOT 0.5 0.6  --   PROT 5.2* 5.6*  --   ALBUMIN 1.9*  1.9* 2.0* 2.0*   CBC:  Recent Labs Lab 10/13/13 0655 10/14/13 0615  WBC 4.0 4.6  NEUTROABS 2.4 2.8  HGB 8.8* 8.6*  HCT 26.6* 26.2*  MCV 110.4* 110.5*  PLT 132* 135*    CBG:  Recent Labs Lab 10/13/13 0830 10/13/13 1226 10/13/13 1710 10/13/13 2049 10/14/13 0829 10/14/13 1152  GLUCAP 223* 136* 243* 310* 101* 244*   Micro Results: No results found for this or any previous visit (from the past 240 hour(s)). Studies/Results: No results found. Medications: Scheduled Meds: . abacavir  300 mg Oral BID  . aspirin EC  81 mg Oral Daily  . cholecalciferol  1,000 Units Oral Daily  . cloNIDine  0.3 mg Oral TID  . darunavir  800 mg Oral Q breakfast  . furosemide  160 mg Oral BID  Or  . furosemide  160 mg Intravenous BID  . heparin  5,000 Units Subcutaneous 3 times per day  . hydrALAZINE  100 mg Oral 3 times per day  . insulin aspart  0-20 Units Subcutaneous TID WC  . insulin aspart  0-5 Units Subcutaneous QHS  . insulin glargine  30 Units Subcutaneous QHS  . labetalol  600 mg Oral BID  . lamiVUDine  150 mg Oral Daily  . levETIRAcetam  1,500 mg Oral BID  . lisinopril  40 mg Oral Daily  . LORazepam  2 mg Intravenous Once  . LORazepam  2 mg Intramuscular Once  . LORazepam  1 mg Oral Daily  . pantoprazole  40 mg Oral Daily  . phenytoin  300 mg Oral Daily  . phenytoin  400 mg Oral QHS  . predniSONE  30 mg Oral Q breakfast  . ritonavir  100 mg Oral Q breakfast  . sodium chloride  3 mL Intravenous Q12H   Continuous Infusions: None PRN Meds:.acetaminophen, cyclobenzaprine, hydrALAZINE, hydrOXYzine, oxyCODONE, oxyCODONE-acetaminophen  Assessment/Plan:  RUE edema -venous doppler to r/o UEDVT pending; no DVT  Volume overload Improving.  Down 1.68L yesterday; weight down ~13 lbs since admission.  Etiology likely secondary to diet/medication noncompliance, also likely secondary to steroid use and  chronic venous insufficiency.   -lasix 160mg  bid -strict Is/Os and daily weights   -sodium restriction -renal following  Chronic uncontrolled hypertension Pt with uncontrolled BP which has been a chronic issue; pt on steroids which is likely a contributing in addition to CKD.  -continue clonidine 0.3mg  tid, lasix 160 bid, hydralazine 100mg  tid, lisinopril 40 mg qd, labetalol 600mg  bid -hydralazine 5mg  q4h PRN   HIV disease CD4 280 09/08/13, VL <20. -resumed home meds according to Dr. Feliz Beam note 09/26/13.  -will confirm these are correct medications by discharge   CKD III  Cr up to 2.13 today; baseline~2. -Renal panel -Renal following, appreciate recs  Hyperglycemia without diabetes  Recent Labs  10/13/13 2049 10/14/13 0829 10/14/13 1152  GLUCAP 310* 101* 244*  Last HA1C (08/02/2013): 6.3.  Most likely medication induced from steroids, possible HIV meds.  -monitor cbg -lantus to 30 units, SSI-resistant, with additional coverage 15 units for meals   Autoimmune hemolytic anemia Stable; hgb today 8.8 yesterday;  per hematology notes: plans to decrease prednisone to 30 mg daily on 10/09/13 for 1 month, then re-evaluate. -continue 30mg  prednisone -monitor CBC  Seizures  Stable-no seizures in 3 days.  Pt reportedly had some seizure like activity during hospitalization with neurology consult.  5/15 EEG: abnormalities: 1) frequent epileptiform discharges from the  left posterior quadrant 2) generalized 3 Hz delta activity.  Clinical Interpretation: EEG  consistent with a potential area of epileptogenicity in the left posterior quadrant. There  was no seizure recorded on this study.  -dilantin 300mg  qam, 400mg  qpm  -keppra 1500mg  bid -monitor levels -seizure precautions  Unspecified protein-calorie malnutrition -Monitor  Asymptomatic bacteriuria  Pt with citrobacter braakii but without symptoms -would not treat given asymptomatic  Deconditioning PT/OT not recommending  SNF  F/E/N -No IVF -Hypokalemia; replete as needed -2 gram sodium diet   DVT Ppx  -Heparin sq   Disposition Anticipated discharge today.    The patient does have a current PCP Christen Bame, MD) and does need an [facility] hospital follow-up appointment after discharge.   LOS: 10 days   Marrian Salvage, MD  10/14/2013, 12:30 PM

## 2013-10-16 ENCOUNTER — Telehealth: Payer: Self-pay | Admitting: *Deleted

## 2013-10-16 LAB — LEVETIRACETAM LEVEL
LEVETIRACETAM: 74.7 ug/mL
LEVETIRACETAM: 77.8 ug/mL

## 2013-10-16 NOTE — Telephone Encounter (Signed)
Please clarify a dilantin dose daily and have triage call pharmacy with answer, thanks

## 2013-10-17 NOTE — Telephone Encounter (Signed)
Per neurology during last hospitalization in 5/15:   dilantin 300mg  qam, 400mg  qpm  Thanks.

## 2013-10-17 NOTE — Discharge Summary (Signed)
Reviewed. Agree with documentation. 

## 2013-10-18 ENCOUNTER — Ambulatory Visit: Payer: Medicaid Other | Admitting: Internal Medicine

## 2013-10-18 ENCOUNTER — Other Ambulatory Visit: Payer: Self-pay | Admitting: *Deleted

## 2013-10-18 NOTE — Telephone Encounter (Signed)
Thank you for the clarification Now if you could delete the 3 scripts in the med list and put 1 new one with the correct dose, directions, etc.all would appreciate, verbal to pharmacy was - phenytoin 300mg  by mouth every morning, 400mg  by mouth every evening #210 w/ 1 refill. If the med list could reflect this.

## 2013-10-18 NOTE — Progress Notes (Signed)
Case discussed with Dr. Sadek soon after the resident saw the patient.  We reviewed the resident's history and exam and pertinent patient test results.  I agree with the assessment, diagnosis, and plan of care documented in the resident's note. 

## 2013-10-18 NOTE — Telephone Encounter (Signed)
Attempted to call pt to schedule appt/ discuss denial, left message

## 2013-10-22 ENCOUNTER — Telehealth: Payer: Self-pay | Admitting: *Deleted

## 2013-10-22 NOTE — Telephone Encounter (Signed)
[facility] calls to say this was first visit w/ pt today and pt exhibited odd behavior. Her BP 180/102, pt had no complaints but has not been compliant with weighing as she should and could not tell [facility] if she had been taking her meds correctly.  Triage called pt and pt denied symptoms of elevated BP, she laughed and was very jovial during the conversation. She stated the walls were talking to her??? And stated that she would come to an appt tomorrow but the little bus comes to pick her up and they need 2 days notice, she is correct on this. She was asked to go to ED, urg care or call 911 if she had chest pain, shortness of breath, severe H/A or weakness of limbs or change in speech, she was agreeable

## 2013-10-22 NOTE — Telephone Encounter (Signed)
Called to pharm Friday

## 2013-10-23 ENCOUNTER — Emergency Department (HOSPITAL_COMMUNITY)
Admission: EM | Admit: 2013-10-23 | Discharge: 2013-10-23 | Disposition: A | Discharge status: Home / Self Care | Payer: Medicaid Other | Attending: Emergency Medicine | Admitting: Emergency Medicine

## 2013-10-23 ENCOUNTER — Encounter (HOSPITAL_COMMUNITY): Payer: Self-pay | Admitting: Emergency Medicine

## 2013-10-23 ENCOUNTER — Telehealth (HOSPITAL_BASED_OUTPATIENT_CLINIC_OR_DEPARTMENT_OTHER): Payer: Self-pay

## 2013-10-23 ENCOUNTER — Emergency Department (HOSPITAL_COMMUNITY): Payer: Medicaid Other

## 2013-10-23 ENCOUNTER — Ambulatory Visit: Payer: Medicaid Other | Admitting: Neurology

## 2013-10-23 DIAGNOSIS — Z79899 Other long term (current) drug therapy: Secondary | ICD-10-CM | POA: Insufficient documentation

## 2013-10-23 DIAGNOSIS — I509 Heart failure, unspecified: Secondary | ICD-10-CM | POA: Insufficient documentation

## 2013-10-23 DIAGNOSIS — I2510 Atherosclerotic heart disease of native coronary artery without angina pectoris: Secondary | ICD-10-CM | POA: Insufficient documentation

## 2013-10-23 DIAGNOSIS — F411 Generalized anxiety disorder: Secondary | ICD-10-CM | POA: Insufficient documentation

## 2013-10-23 DIAGNOSIS — Z21 Asymptomatic human immunodeficiency virus [HIV] infection status: Secondary | ICD-10-CM | POA: Insufficient documentation

## 2013-10-23 DIAGNOSIS — E7220 Disorder of urea cycle metabolism, unspecified: Secondary | ICD-10-CM | POA: Insufficient documentation

## 2013-10-23 DIAGNOSIS — R1084 Generalized abdominal pain: Secondary | ICD-10-CM | POA: Insufficient documentation

## 2013-10-23 DIAGNOSIS — N189 Chronic kidney disease, unspecified: Secondary | ICD-10-CM | POA: Insufficient documentation

## 2013-10-23 DIAGNOSIS — R7989 Other specified abnormal findings of blood chemistry: Secondary | ICD-10-CM

## 2013-10-23 DIAGNOSIS — Z8619 Personal history of other infectious and parasitic diseases: Secondary | ICD-10-CM | POA: Insufficient documentation

## 2013-10-23 DIAGNOSIS — Z794 Long term (current) use of insulin: Secondary | ICD-10-CM | POA: Insufficient documentation

## 2013-10-23 DIAGNOSIS — Z7982 Long term (current) use of aspirin: Secondary | ICD-10-CM | POA: Insufficient documentation

## 2013-10-23 DIAGNOSIS — E119 Type 2 diabetes mellitus without complications: Secondary | ICD-10-CM | POA: Insufficient documentation

## 2013-10-23 DIAGNOSIS — R4182 Altered mental status, unspecified: Secondary | ICD-10-CM | POA: Insufficient documentation

## 2013-10-23 DIAGNOSIS — I129 Hypertensive chronic kidney disease with stage 1 through stage 4 chronic kidney disease, or unspecified chronic kidney disease: Secondary | ICD-10-CM | POA: Insufficient documentation

## 2013-10-23 DIAGNOSIS — IMO0002 Reserved for concepts with insufficient information to code with codable children: Secondary | ICD-10-CM | POA: Insufficient documentation

## 2013-10-23 DIAGNOSIS — Z8673 Personal history of transient ischemic attack (TIA), and cerebral infarction without residual deficits: Secondary | ICD-10-CM | POA: Insufficient documentation

## 2013-10-23 DIAGNOSIS — G40909 Epilepsy, unspecified, not intractable, without status epilepticus: Secondary | ICD-10-CM | POA: Insufficient documentation

## 2013-10-23 LAB — CBC WITH DIFFERENTIAL/PLATELET
BASOS ABS: 0 10*3/uL (ref 0.0–0.1)
BASOS PCT: 0 % (ref 0–1)
Eosinophils Absolute: 0 10*3/uL (ref 0.0–0.7)
Eosinophils Relative: 0 % (ref 0–5)
HCT: 22.1 % — ABNORMAL LOW (ref 36.0–46.0)
HEMOGLOBIN: 7.7 g/dL — AB (ref 12.0–15.0)
Lymphocytes Relative: 13 % (ref 12–46)
Lymphs Abs: 0.7 10*3/uL (ref 0.7–4.0)
MCH: 37.7 pg — AB (ref 26.0–34.0)
MCHC: 34.8 g/dL (ref 30.0–36.0)
MCV: 108.3 fL — ABNORMAL HIGH (ref 78.0–100.0)
Monocytes Absolute: 0.3 10*3/uL (ref 0.1–1.0)
Monocytes Relative: 5 % (ref 3–12)
NEUTROS ABS: 4 10*3/uL (ref 1.7–7.7)
NEUTROS PCT: 82 % — AB (ref 43–77)
Platelets: 159 10*3/uL (ref 150–400)
RBC: 2.04 MIL/uL — ABNORMAL LOW (ref 3.87–5.11)
RDW: 12.6 % (ref 11.5–15.5)
WBC: 4.9 10*3/uL (ref 4.0–10.5)

## 2013-10-23 LAB — COMPREHENSIVE METABOLIC PANEL
ALBUMIN: 2.3 g/dL — AB (ref 3.5–5.2)
ALK PHOS: 251 U/L — AB (ref 39–117)
ALT: 117 U/L — ABNORMAL HIGH (ref 0–35)
AST: 117 U/L — AB (ref 0–37)
BUN: 68 mg/dL — ABNORMAL HIGH (ref 6–23)
CO2: 19 mEq/L (ref 19–32)
CREATININE: 2.2 mg/dL — AB (ref 0.50–1.10)
Calcium: 8.4 mg/dL (ref 8.4–10.5)
Chloride: 104 mEq/L (ref 96–112)
GFR calc Af Amer: 28 mL/min — ABNORMAL LOW (ref >90)
GFR calc non Af Amer: 24 mL/min — ABNORMAL LOW (ref >90)
Glucose, Bld: 205 mg/dL — ABNORMAL HIGH (ref 70–99)
POTASSIUM: 4.1 meq/L (ref 3.7–5.3)
Sodium: 140 mEq/L (ref 137–147)
Total Bilirubin: 1.3 mg/dL — ABNORMAL HIGH (ref 0.3–1.2)
Total Protein: 6 g/dL (ref 6.0–8.3)

## 2013-10-23 LAB — URINALYSIS, ROUTINE W REFLEX MICROSCOPIC
Bilirubin Urine: NEGATIVE
Glucose, UA: NEGATIVE mg/dL
Hgb urine dipstick: NEGATIVE
KETONES UR: NEGATIVE mg/dL
Leukocytes, UA: NEGATIVE
NITRITE: NEGATIVE
Protein, ur: >300 mg/dL — AB
Specific Gravity, Urine: 1.012 (ref 1.005–1.030)
UROBILINOGEN UA: 1 mg/dL (ref 0.0–1.0)
pH: 6 (ref 5.0–8.0)

## 2013-10-23 LAB — URINE MICROSCOPIC-ADD ON

## 2013-10-23 LAB — ACETAMINOPHEN LEVEL: Acetaminophen (Tylenol), Serum: <15 ug/mL (ref 10–30)

## 2013-10-23 LAB — PHENYTOIN LEVEL, TOTAL: Phenytoin Lvl: 5.7 ug/mL — ABNORMAL LOW (ref 10.0–20.0)

## 2013-10-23 LAB — PROTIME-INR
INR: 0.83 (ref 0.00–1.49)
PROTHROMBIN TIME: 11.3 s — AB (ref 11.6–15.2)

## 2013-10-23 LAB — SALICYLATE LEVEL: Salicylate Lvl: <2 mg/dL — ABNORMAL LOW (ref 2.8–20.0)

## 2013-10-23 LAB — TROPONIN I: Troponin I: <0.3 ng/mL (ref <0.30)

## 2013-10-23 LAB — AMMONIA: Ammonia: 87 umol/L — ABNORMAL HIGH (ref 11–60)

## 2013-10-23 NOTE — ED Notes (Signed)
Per EMS- pt ws found by home health nurse to be acting "bizarre". Reported to have an empty bottle of narcotics in the home. Nurse reported possible rt sided weakness.

## 2013-10-23 NOTE — ED Notes (Signed)
Consulting MD at bedside

## 2013-10-23 NOTE — Progress Notes (Signed)
Pharmacy Consult: Patient's phenytoin level is 5.7, albumin 2.3 and CrCl ~34ml/min.  Level corrects to 10.2 mg/L.  Loading dose is not needed as level therapeutic.  Thanks, Talbert Cage, PharmD

## 2013-10-23 NOTE — Consult Note (Signed)
Date: 10/23/2013               Patient Name:  Michelle [name] MRN: [id number]  DOB: Oct 25, 1960 Age / Sex: 53 y.o., female   PCP: Clinton Gallant, MD         Requesting Physician: Dr. Ezequiel Essex, MD    Consulting Reason:  "bizare behavior"     Chief Complaint: Bizarre behavior   History of Present Illness: Michelle [name] states she is in her normal state of health and that when the "woman who comes to give me my bath and help with medicines came over" she had not been able to take her "orange pill" but otherwise all her meds for the morning. She says she doesn't feel that she had a seizure and didn't fall on the floor or loose consciousness. Patient denies any headache, blurry vision, weakness, diplopia, visual hallucinations, worsening SOB, DOE, worsening LE edema in fact patient states "these are the best they have looked in a long time," no paraesthesias, no gait problems, nausea/vomiting/diarrhea, no abdominal pain, no gross bleeding, hematuria, bruising, and no pyuria/dysuria/decreased urine volume. Pt states she is not having auditory hallucinations and isn't suicidal. She states she didn't take extra pills and understands there was an empty pill bottle that is because she was "filling up her medication box." The patient becomes aggressive during the interview and states "my problem is that I want and need to change my doctor because I am a sick woman and need a doctor to visit me in my home everyday." she was reminded that all these attempts of 24 hr care were done during many of her recent hospitalizations and she remembers but then states "well it is because my sister's take my money and I don't want to wait for my check to come in Friday for her to give me rides so I need to come in to the hospital."   Meds: No current facility-administered medications for this encounter.   Current Outpatient Prescriptions  Medication Sig Dispense Refill  . abacavir (ZIAGEN) 300 MG tablet Take 300 mg by mouth 2  (two) times daily.      [name] acyclovir (ZOVIRAX) 800 MG tablet Take 800 mg by mouth 5 (five) times daily.      [name] aspirin EC 81 MG tablet Take 1 tablet (81 mg total) by mouth daily.  30 tablet  11  . Blood Glucose Monitoring Suppl (ACCU-CHEK AVIVA PLUS) W/DEVICE KIT USE AS DIRECTED  1 kit  0  . cholecalciferol (VITAMIN D) 1000 UNITS tablet Take 1,000 Units by mouth daily.      . cloNIDine (CATAPRES) 0.3 MG tablet Take 1 tablet (0.3 mg total) by mouth 3 (three) times daily.  90 tablet  0  . cyclobenzaprine (FLEXERIL) 5 MG tablet Take 1 tablet (5 mg total) by mouth daily as needed for muscle spasms.  10 tablet  0  . Darunavir Ethanolate (PREZISTA) 800 MG tablet Take 1 tablet (800 mg total) by mouth daily with breakfast.  30 tablet  11  . diphenhydrAMINE (BENADRYL) 25 MG tablet Take 25 mg by mouth every 6 (six) hours as needed for itching.      . furosemide (LASIX) 80 MG tablet Take 1 tablet (80 mg total) by mouth 2 (two) times daily.  60 tablet  1  . gentamicin (GARAMYCIN) 0.3 % ophthalmic ointment Place 1 application into both eyes 4 (four) times daily.      . hydrALAZINE (APRESOLINE) 50 MG tablet Take 2 tablets (  100 mg total) by mouth 3 (three) times daily.  90 tablet  1  . insulin aspart (NOVOLOG) 100 UNIT/ML injection Inject 7 Units into the skin 3 (three) times daily with meals. Based on sliding scale  10 mL  1  . insulin glargine (LANTUS) 100 UNIT/ML injection Inject 0.3 mLs (30 Units total) into the skin at bedtime.  10 mL  2  . labetalol (NORMODYNE) 300 MG tablet Take 2 tablets (600 mg total) by mouth 2 (two) times daily.  60 tablet  1  . lamiVUDine (EPIVIR) 150 MG tablet Take 1 tablet (150 mg total) by mouth daily.  30 tablet  6  . levETIRAcetam (KEPPRA) 500 MG tablet Take 3 tablets (1,500 mg total) by mouth 2 (two) times daily.  120 tablet  1  . lisinopril (PRINIVIL,ZESTRIL) 40 MG tablet Take 1 tablet (40 mg total) by mouth daily.  90 tablet  3  . methocarbamol (ROBAXIN) 500 MG tablet Take 500  mg by mouth 2 (two) times daily as needed for muscle spasms.      [name] oxyCODONE-acetaminophen (PERCOCET) 10-325 MG per tablet Take 1 tablet by mouth every 4 (four) hours as needed for pain.  20 tablet  0  . pantoprazole (PROTONIX) 40 MG tablet Take 1 tablet (40 mg total) by mouth daily.  30 tablet  11  . phenytoin (DILANTIN) 30 MG ER capsule Take 1 capsule (30 mg total) by mouth at bedtime.  30 capsule  1  . phenytoin (DILANTIN) 300 MG ER capsule Take 1 capsule (300 mg total) by mouth at bedtime.  30 capsule  1  . phenytoin (DILANTIN) 300 MG ER capsule Take 1 capsule (300 mg total) by mouth daily.  30 capsule  1  . potassium chloride (K-DUR,KLOR-CON) 10 MEQ tablet Take 1 tablet (10 mEq total) by mouth daily.  30 tablet  1  . predniSONE (DELTASONE) 20 MG tablet Take 1.5 tablets (30 mg total) by mouth daily with breakfast.  30 tablet  1  . ritonavir (NORVIR) 100 MG TABS tablet Take 1 tablet (100 mg total) by mouth daily with breakfast.  30 tablet  11    Allergies: Allergies as of 10/23/2013 - Review Complete 10/04/2013  Allergen Reaction Noted  . Ceftriaxone  06/01/2013  . Norvasc [amlodipine besylate]  03/30/2013  . Morphine and related Hives, Itching, and Rash 02/15/2013   Past Medical History  Diagnosis Date  . Seizures   . Stroke   . Meningitis   . HIV (human immunodeficiency virus infection)   . Hypertension   . Gout   . Muscle spasms of head and/or neck   . CKD (chronic kidney disease)   . CHF (congestive heart failure)     [name] 06/18/2013  . HCV (hepatitis C virus)     chronic/notes 06/18/2013  . Type II diabetes mellitus     [name] 06/18/2013  . AIHA (autoimmune hemolytic anemia)     [name] 06/18/2013  . Hypertensive encephalopathy ~ 05/2013    hospitalaized/notes 06/18/2013  . Daily headache     "for the last 6 years/notes 06/18/2013  . Exertional shortness of breath     [name] 06/18/2013  . Anxiety     [name] 06/18/2013  . Nephrotic syndrome   . History of syphilis   .  High cholesterol    Past Surgical History  Procedure Laterality Date  . Hip pinning Right   . Av fistula placement Right 07/24/2013    Procedure: RIGHT arm exploration of antecubital space;  Surgeon: Elam Dutch, MD;  Location: [facility] OR;  Service: Vascular;  Laterality: Right;   Family History  Problem Relation Age of Onset  . Cancer - Colon Mother   . Cancer Father   . Hypertension Father   . Diabetes    . Diabetes Sister    History   Social History  . Marital Status: Single    Spouse Name: N/A    Number of Children: 4  . Years of Education: 11th   Occupational History  . Not on file.   Social History Main Topics  . Smoking status: Former Smoker    Types: Cigarettes    Quit date: 06/19/2010  . Smokeless tobacco: Never Used  . Alcohol Use: No  . Drug Use: No     Comment: past history of alcohol, cocaine and IV drug use  . Sexual Activity: Not Currently    Partners: Male     Comment: given condoms   Other Topics Concern  . Not on file   Social History Narrative   Patient lives at home with sister.    Patient is unemployed.    Patient is single.    Patient has 2 alive and 2 deceased.    Patient has 11th grade education.           Review of Systems: Pertinent items are noted in HPI. A complete 12 point ROS was performed all negative except that listed in HPI.  Physical Exam: Blood pressure 209/87, pulse 84, temperature 97.8 F (36.6 C), temperature source Rectal, resp. rate 31, SpO2 98.00%. General: resting in bed, NAD HEENT: PERRL, EOMI, no scleral icterus Cardiac: RRR, no rubs, murmurs or gallops Pulm: clear to auscultation bilaterally, no crackles, wheezes, or rhonchi, moving normal volumes of air Abd: soft, nontender, nondistended, BS present Ext: warm and well perfused, 1+ pedal edema to shin, no open wounds or weeping wounds, some hyperpigmented skin areas   Neuro: alert and oriented X3, UE 5/5, DTR 2+ biceps, triceps, patellar, LE 5/5, cranial  nerves II-XII intact Psych: pt is eccentric in expression but has clear and fluid thought process, long term and short term memory intact, denies hallucinations, denies suicidal and homicidal ideation   Lab results: Basic Metabolic Panel:  Recent Labs  10/23/13 1055  NA 140  K 4.1  CL 104  CO2 19  GLUCOSE 205*  BUN 68*  CREATININE 2.20*  CALCIUM 8.4   Liver Function Tests:  Recent Labs  10/23/13 1055  AST 117*  ALT 117*  ALKPHOS 251*  BILITOT 1.3*  PROT 6.0  ALBUMIN 2.3*    Recent Labs  10/23/13 1355  AMMONIA 87*   CBC:  Recent Labs  10/23/13 1055  WBC 4.9  NEUTROABS 4.0  HGB 7.7*  HCT 22.1*  MCV 108.3*  PLT 159   Cardiac Enzymes:  Recent Labs  10/23/13 1055  TROPONINI <0.30   Coagulation:  Recent Labs  10/23/13 1055  LABPROT 11.3*  INR 0.83   Urinalysis:  Recent Labs  10/23/13 1035  COLORURINE YELLOW  LABSPEC 1.012  PHURINE 6.0  GLUCOSEU NEGATIVE  HGBUR NEGATIVE  BILIRUBINUR NEGATIVE  KETONESUR NEGATIVE  PROTEINUR >300*  UROBILINOGEN 1.0  NITRITE NEGATIVE  LEUKOCYTESUR NEGATIVE   Imaging results:  Dg Chest 2 View  10/23/2013   CLINICAL DATA:  Hypertension.  Altered mental status.  EXAM: CHEST  2 VIEW  COMPARISON:  10/04/2013  FINDINGS: Linear opacity at the left lung base is stable likely scarring or chronic atelectasis.  Lungs  are otherwise clear.  No pleural effusion.  No pneumothorax.  Cardiac silhouette is mildly enlarged. No mediastinal or hilar masses.  Bony thorax is intact a  IMPRESSION: No acute cardiopulmonary disease.   Electronically Signed   By: Lajean Manes M.D.   On: 10/23/2013 11:57   Ct Head Wo Contrast  10/23/2013   CLINICAL DATA:  Altered mental status  EXAM: CT HEAD WITHOUT CONTRAST  TECHNIQUE: Contiguous axial images were obtained from the base of the skull through the vertex without intravenous contrast. Study was obtained within 24 hr of patient's arrival at the emergency department.  COMPARISON:  Sep 30, 2013  FINDINGS: There is mild diffuse atrophy, stable. There is no mass, hemorrhage, extra-axial fluid collection, or midline shift. There is patchy small vessel disease in the centra semiovale bilaterally, stable. There is no acute appearing infarct. Bony calvarium appears intact. The mastoid air cells are clear.  IMPRESSION: The mild diffuse atrophy with patchy periventricular small vessel disease, stable. No intracranial mass, hemorrhage, or acute appearing infarct.   Electronically Signed   By: Lowella Grip M.D.   On: 10/23/2013 11:39    Other results: EKG: normal EKG, normal sinus rhythm, unchanged from previous tracings.  Assessment, Plan, & Recommendations by Problem: Bizarre behavior- it is unclear exactly what was being exhibited by the patient when [facility] went to evaluate patient. It is well documented that there is concern for psychiatric disorder given many previous claims of the patient that satan is trying to fight her or she talks/responds to some internal stimuli and patient even made threats of suicide one point this year. This doesn't appear to be the patient's current active problem during this evaluation and interview and is not actively suicidal or homicidal. Patient has been referred numerous times to behavioral health for evaluation and treatment and patient either doesn't make appointments or cancels denying she has any problems. In terms of a metabolic etiology all of her labs upon review reflect the patient's baseline. Her Cr is at her baseline of 2 and BUN in 60s she doesn't appear fluid overloaded or have symptoms to suggest uremia. Her LFTs are elevated in light of her chronic hep C and nothing above her baseline. Pt was not hypoxic at any point to contribute to AMS. In terms of ischemic etiology the patient has stable unchanged EKG and negative troponin along with no chest pains or SOB, pt doesn't have any localizing neurological signs to suggest acute CVA, pt doesn't appear  to be postictal or have todd's paralysis also pt phenytoin level was within range along with the RN witness to no seizure like activity. Pt has had normal TSH and DM relatively well controlled last HgbA1c 6.3. In terms of substances her salicylate and tylenol levels were normal and AG only 17 in light of her significant CKD. Pt doesn't exhibit any signs of infection such as no fever, normal leukocytes, and no other symptoms such as cough, diarrhea, or wounds. In light of all these negative findings and non-localizing physical exam findings it is recommended that patient be discharged from the ED with follow up in clinic.  -ok to discharge per medicine -f/u in [facility] on 10/25/13 at 345 with PCP already arranged and patient made away -would recommend psych evaluation and referral again at this time -if [facility] feels patient safety is involved action towards SNF or involvement of APS may need to be conducted with CSW consult   The patient does have a current PCP Clinton Gallant,  MD) and does need an [facility] hospital follow-up appointment after discharge.  The patient does not have transportation limitations that hinder transportation to clinic appointments.  Signed: Clinton Gallant, MD 10/23/2013, 2:33 PM

## 2013-10-23 NOTE — Telephone Encounter (Signed)
Pt/caregiver calling pt just got home from hospital w/caregiver and they checked her sugar and it was 430  Asking when last CBG taken informed just before 11 am.  Caregiver requesting to make complaint sent to service excellence line

## 2013-10-23 NOTE — Discharge Instructions (Signed)
Altered Mental Status °Altered mental status most often refers to an abnormal change in your responsiveness and awareness. It can affect your speech, thought, mobility, memory, attention span, or alertness. It can range from slight confusion to complete unresponsiveness (coma). Altered mental status can be a sign of a serious underlying medical condition. Rapid evaluation and medical treatment is necessary for patients having an altered mental status. °CAUSES  °· Low blood sugar (hypoglycemia) or diabetes. °· Severe loss of body fluids (dehydration) or a body salt (electrolyte) imbalance. °· A stroke or other neurologic problem, such as dementia or delirium. °· A head injury or tumor. °· A drug or alcohol overdose. °· Exposure to toxins or poisons. °· Depression, anxiety, and stress. °· A low oxygen level (hypoxia). °· An infection. °· Blood loss. °· Twitching or shaking (seizure). °· Heart problems, such as heart attack or heart rhythm problems (arrhythmias). °· A body temperature that is too low or too high (hypothermia or hyperthermia). °DIAGNOSIS  °A diagnosis is based on your history, symptoms, physical and neurologic examinations, and diagnostic tests. Diagnostic tests may include: °· Measurement of your blood pressure, pulse, breathing, and oxygen levels (vital signs). °· Blood tests. °· Urine tests. °· X-Givan exams. °· A computerized magnetic scan (magnetic resonance imaging, MRI). °· A computerized X-Vandiver scan (computed tomography, CT scan). °TREATMENT  °Treatment will depend on the cause. Treatment may include: °· Management of an underlying medical or mental health condition. °· Critical care or support in the hospital. °HOME CARE INSTRUCTIONS  °· Only take over-the-counter or prescription medicines for pain, discomfort, or fever as directed by your caregiver. °· Manage underlying conditions as directed by your caregiver. °· Eat a healthy, well-balanced diet to maintain strength. °· Join a support group or  prevention program to cope with the condition or trauma that caused the altered mental status. Ask your caregiver to help choose a program that works for you. °· Follow up with your caregiver for further examination, therapy, or testing as directed. °SEEK MEDICAL CARE IF:  °· You feel unwell or have chills. °· You or your family notice a change in your behavior or your alertness. °· You have trouble following your caregiver's treatment plan. °· You have questions or concerns. °SEEK IMMEDIATE MEDICAL CARE IF:  °· You have a rapid heartbeat or have chest pain. °· You have difficulty breathing. °· You have a fever. °· You have a headache with a stiff neck. °· You cough up blood. °· You have blood in your urine or stool. °· You have severe agitation or confusion. °MAKE SURE YOU:  °· Understand these instructions. °· Will watch your condition. °· Will get help right away if you are not doing well or get worse. °Document Released: 11/03/2009 Document Revised: 08/08/2011 Document Reviewed: 11/03/2009 °[employer]® Patient Information ©2014 [employer]. ° °

## 2013-10-23 NOTE — Consult Note (Signed)
Attending Physician Note: I have personally interviewed and completely examined this patient and concur with findings and management plan outlined by senior resident physician Dr. Christen Bame.

## 2013-10-23 NOTE — ED Provider Notes (Signed)
CSN: [id number]     Arrival date & time 10/23/13  3570 History   First MD Initiated Contact with Patient 10/23/13 1004     Chief Complaint  Patient presents with  . Altered Mental Status     (Consider location/radiation/quality/duration/timing/severity/associated sxs/prior Treatment) HPI Comments: Level V cavity applies secondary to altered mental status.  Patient is a 53 year old female with a complicated medical history including seizures, stroke, HIV, hypertension, CAD, CHF, and diabetes mellitus. She presents to the emergency department for altered mental status. Per EMS, patient was found by home health nurse acting "bizarre". Home health nurse found empty narcotics bottle at home; no known tablet count. Patient alert in exam room.  Patient is a 53 y.o. female presenting with altered mental status. The history is provided by the EMS personnel. No language interpreter was used.  Altered Mental Status   Past Medical History  Diagnosis Date  . Seizures   . Stroke   . Meningitis   . HIV (human immunodeficiency virus infection)   . Hypertension   . Gout   . Muscle spasms of head and/or neck   . CKD (chronic kidney disease)   . CHF (congestive heart failure)     [name] 06/18/2013  . HCV (hepatitis C virus)     chronic/notes 06/18/2013  . Type II diabetes mellitus     [name] 06/18/2013  . AIHA (autoimmune hemolytic anemia)     [name] 06/18/2013  . Hypertensive encephalopathy ~ 05/2013    hospitalaized/notes 06/18/2013  . Daily headache     "for the last 6 years/notes 06/18/2013  . Exertional shortness of breath     [name] 06/18/2013  . Anxiety     [name] 06/18/2013  . Nephrotic syndrome   . History of syphilis   . High cholesterol    Past Surgical History  Procedure Laterality Date  . Hip pinning Right   . Av fistula placement Right 07/24/2013    Procedure: RIGHT arm exploration of antecubital space;  Surgeon: Elam Dutch, MD;  Location: [facility] OR;  Service: Vascular;   Laterality: Right;   Family History  Problem Relation Age of Onset  . Cancer - Colon Mother   . Cancer Father   . Hypertension Father   . Diabetes    . Diabetes Sister    History  Substance Use Topics  . Smoking status: Former Smoker    Types: Cigarettes    Quit date: 06/19/2010  . Smokeless tobacco: Never Used  . Alcohol Use: No   OB History   Grav Para Term Preterm Abortions TAB SAB Ect Mult Living                  Review of Systems  Unable to perform ROS: Mental status change     Allergies  Ceftriaxone; Norvasc; and Morphine and related  Home Medications   Prior to Admission medications   Medication Sig Start Date End Date Taking? Authorizing Provider  abacavir (ZIAGEN) 300 MG tablet Take 300 mg by mouth 2 (two) times daily.    Historical Provider, MD  acyclovir (ZOVIRAX) 800 MG tablet Take 800 mg by mouth 5 (five) times daily.    Historical Provider, MD  aspirin EC 81 MG tablet Take 1 tablet (81 mg total) by mouth daily. 06/02/13   Lesly Dukes, MD  Blood Glucose Monitoring Suppl (ACCU-CHEK AVIVA PLUS) W/DEVICE KIT USE AS DIRECTED 09/04/13   Karren Cobble, MD  cholecalciferol (VITAMIN D) 1000 UNITS tablet Take 1,000 Units by  mouth daily.    Historical Provider, MD  cloNIDine (CATAPRES) 0.3 MG tablet Take 1 tablet (0.3 mg total) by mouth 3 (three) times daily. 10/14/13 10/14/14  Cresenciano Genre, MD  cyclobenzaprine (FLEXERIL) 5 MG tablet Take 1 tablet (5 mg total) by mouth daily as needed for muscle spasms. 09/09/13   Corky Sox, MD  Darunavir Ethanolate (PREZISTA) 800 MG tablet Take 1 tablet (800 mg total) by mouth daily with breakfast. 09/26/13   Carlyle Basques, MD  diphenhydrAMINE (BENADRYL) 25 MG tablet Take 25 mg by mouth every 6 (six) hours as needed for itching.    Historical Provider, MD  furosemide (LASIX) 80 MG tablet Take 1 tablet (80 mg total) by mouth 2 (two) times daily. 10/14/13   Cresenciano Genre, MD  gentamicin (GARAMYCIN) 0.3 % ophthalmic ointment Place 1  application into both eyes 4 (four) times daily.    Historical Provider, MD  hydrALAZINE (APRESOLINE) 50 MG tablet Take 2 tablets (100 mg total) by mouth 3 (three) times daily. 10/14/13   Cresenciano Genre, MD  insulin aspart (NOVOLOG) 100 UNIT/ML injection Inject 7 Units into the skin 3 (three) times daily with meals. Based on sliding scale 10/14/13   Cresenciano Genre, MD  insulin glargine (LANTUS) 100 UNIT/ML injection Inject 0.3 mLs (30 Units total) into the skin at bedtime. 10/14/13   Cresenciano Genre, MD  labetalol (NORMODYNE) 300 MG tablet Take 2 tablets (600 mg total) by mouth 2 (two) times daily. 10/14/13   Cresenciano Genre, MD  lamiVUDine (EPIVIR) 150 MG tablet Take 1 tablet (150 mg total) by mouth daily. 06/20/13   Lesly Dukes, MD  levETIRAcetam (KEPPRA) 500 MG tablet Take 3 tablets (1,500 mg total) by mouth 2 (two) times daily. 10/14/13   Cresenciano Genre, MD  lisinopril (PRINIVIL,ZESTRIL) 40 MG tablet Take 1 tablet (40 mg total) by mouth daily. 09/04/13   Karren Cobble, MD  methocarbamol (ROBAXIN) 500 MG tablet Take 500 mg by mouth 2 (two) times daily as needed for muscle spasms.    Historical Provider, MD  oxyCODONE-acetaminophen (PERCOCET) 10-325 MG per tablet Take 1 tablet by mouth every 4 (four) hours as needed for pain. 09/20/13   Clinton Gallant, MD  pantoprazole (PROTONIX) 40 MG tablet Take 1 tablet (40 mg total) by mouth daily. 06/02/13   Lesly Dukes, MD  phenytoin (DILANTIN) 30 MG ER capsule Take 1 capsule (30 mg total) by mouth at bedtime. 10/14/13   Cresenciano Genre, MD  phenytoin (DILANTIN) 300 MG ER capsule Take 1 capsule (300 mg total) by mouth at bedtime. 10/14/13   Cresenciano Genre, MD  phenytoin (DILANTIN) 300 MG ER capsule Take 1 capsule (300 mg total) by mouth daily. 10/14/13   Cresenciano Genre, MD  potassium chloride (K-DUR,KLOR-CON) 10 MEQ tablet Take 1 tablet (10 mEq total) by mouth daily. 10/14/13   Cresenciano Genre, MD  predniSONE (DELTASONE) 20 MG tablet Take 1.5 tablets (30 mg total) by mouth daily  with breakfast. 10/14/13   Cresenciano Genre, MD  ritonavir (NORVIR) 100 MG TABS tablet Take 1 tablet (100 mg total) by mouth daily with breakfast. 09/26/13   Carlyle Basques, MD   BP 207/85  Pulse 80  Temp(Src) 97.8 F (36.6 C) (Rectal)  Resp 27  SpO2 98%  Physical Exam  Nursing note and vitals reviewed. Constitutional: She appears well-developed and well-nourished. No distress.  Nontoxic/nonseptic appearing.  HENT:  Head: Normocephalic and atraumatic.  Mouth/Throat: Oropharynx is clear and  moist. No oropharyngeal exudate.  Eyes: Conjunctivae and EOM are normal. Pupils are equal, round, and reactive to light. No scleral icterus.  Neck: Normal range of motion. Neck supple.  No nuchal rigidity or meningismus.  Cardiovascular: Normal rate, regular rhythm and normal heart sounds.   Pulmonary/Chest: Effort normal and breath sounds normal. No respiratory distress. She has no wheezes. She has no rales.  Abdominal: Soft. She exhibits no mass. There is tenderness (mild, diffuse). There is no rebound and no guarding.  Mild diffuse tenderness on deep palpation. No focal TTP or peritoneal signs. Abdomen soft.  Musculoskeletal: Normal range of motion. She exhibits edema (2+ pitting b/l lower extremities).  Neurological: She is alert.  GCS 14. Patient alert and oriented to self. Speech is goal oriented; no slurring. No facial drooping; eyebrow raise symmetric. Normal EOMs. Patient very uncooperative for exam.  Skin: Skin is warm and dry. No rash noted. She is not diaphoretic. No erythema. No pallor.  Psychiatric: She has a normal mood and affect. Her behavior is normal.    ED Course  Procedures (including critical care time) Labs Review Labs Reviewed  CBC WITH DIFFERENTIAL - Abnormal; Notable for the following:    RBC 2.04 (*)    Hemoglobin 7.7 (*)    HCT 22.1 (*)    MCV 108.3 (*)    MCH 37.7 (*)    Neutrophils Relative % 82 (*)    All other components within normal limits  COMPREHENSIVE  METABOLIC PANEL - Abnormal; Notable for the following:    Glucose, Bld 205 (*)    BUN 68 (*)    Creatinine, Ser 2.20 (*)    Albumin 2.3 (*)    AST 117 (*)    ALT 117 (*)    Alkaline Phosphatase 251 (*)    Total Bilirubin 1.3 (*)    GFR calc non Af Amer 24 (*)    GFR calc Af Amer 28 (*)    All other components within normal limits  PROTIME-INR - Abnormal; Notable for the following:    Prothrombin Time 11.3 (*)    All other components within normal limits  SALICYLATE LEVEL - Abnormal; Notable for the following:    Salicylate Lvl <6.2 (*)    All other components within normal limits  URINALYSIS, ROUTINE W REFLEX MICROSCOPIC - Abnormal; Notable for the following:    Protein, ur >300 (*)    All other components within normal limits  PHENYTOIN LEVEL, TOTAL - Abnormal; Notable for the following:    Phenytoin Lvl 5.7 (*)    All other components within normal limits  AMMONIA - Abnormal; Notable for the following:    Ammonia 87 (*)    All other components within normal limits  ACETAMINOPHEN LEVEL  TROPONIN I  URINE MICROSCOPIC-ADD ON    Imaging Review Dg Chest 2 View  10/23/2013   CLINICAL DATA:  Hypertension.  Altered mental status.  EXAM: CHEST  2 VIEW  COMPARISON:  10/04/2013  FINDINGS: Linear opacity at the left lung base is stable likely scarring or chronic atelectasis.  Lungs are otherwise clear.  No pleural effusion.  No pneumothorax.  Cardiac silhouette is mildly enlarged. No mediastinal or hilar masses.  Bony thorax is intact a  IMPRESSION: No acute cardiopulmonary disease.   Electronically Signed   By: Lajean Manes M.D.   On: 10/23/2013 11:57   Ct Head Wo Contrast  10/23/2013   CLINICAL DATA:  Altered mental status  EXAM: CT HEAD WITHOUT CONTRAST  TECHNIQUE: Contiguous  axial images were obtained from the base of the skull through the vertex without intravenous contrast. Study was obtained within 24 hr of patient's arrival at the emergency department.  COMPARISON:  Sep 30, 2013   FINDINGS: There is mild diffuse atrophy, stable. There is no mass, hemorrhage, extra-axial fluid collection, or midline shift. There is patchy small vessel disease in the centra semiovale bilaterally, stable. There is no acute appearing infarct. Bony calvarium appears intact. The mastoid air cells are clear.  IMPRESSION: The mild diffuse atrophy with patchy periventricular small vessel disease, stable. No intracranial mass, hemorrhage, or acute appearing infarct.   Electronically Signed   By: Lowella Grip M.D.   On: 10/23/2013 11:39     EKG Interpretation None      MDM   Final diagnoses:  Altered mental status  Increased ammonia level    2383 - 53 year old female with complicated medical history presents to the emergency department for altered mental status. Patient was found by home health nurse acting "bizarre". EMS reported that there was an empty bottle of narcotics in the home. Pill count unknown. Patient is alert and oriented to self on arrival. No focal neurologic deficits appreciated, though exam difficult to complete secondary to patient condition and her being uncooperative. Laboratory workup pending and CT head and chest x-[place] ordered.  1225 - Patient reevaluated. She is mentating much better and speaks in full goal oriented sentences. Patient is now alert to person, place, and time. She is hungry and requesting something to eat. Neurologic exam has improved. Patient with normal sensation in all extremities as well as equal grip strength bilaterally and normal strength against resistance. Reflexes normal and symmetric.  Workup today significant only for subtherapeutic Dilantin level. Suspect AMS secondary to seizure activity and prolonged postictal phase. Will load with IV Dilantin. H/H slightly lower than baseline at 7.7. No signs of acute blood loss on exam. No tachycardia, hypotension, or hypoxia.  1315 - Notified by pharmacist that, per Cr and albumin levels, dilantin level  today is therapeutic. This makes seizure activity less likely. Have consulted IM teaching for admission as, despite improvement, patient still appears acutely altered.  [place] with Dr. Algis Liming regarding patient care. She has seen and evaluated the patient and finds the patient is stable for discharge today. I emphasized to Dr. Algis Liming that the patient did have an elevated ammonia level. She states that she is still comfortable discharging the patient from the hospital. Patient has scheduled follow up appointment on the 29th, 2 days from today. Return precautions provided for the patient and patient discharged in stable condition.  Antonietta Breach, PA-C 10/23/13 1448

## 2013-10-24 ENCOUNTER — Encounter: Payer: Self-pay | Admitting: Neurology

## 2013-10-24 NOTE — ED Provider Notes (Addendum)
Medical screening examination/treatment/procedure(s) were conducted as a shared visit with non-physician practitioner(s) and myself.  I personally evaluated the patient during the encounter.  Hx seizures, HIV (CD4 280 4/15), HTN from home with strange behavior per [facility]. No reported seizure activity. Found with empty pill bottles but was "filling up med tray".  No fever, chest pain, SOB, abdominal pain.  Stable anemia and CKD, AG 15, not in DKA.  CT head and CXR negative, UA negative. Dilantin level therapeutic per pharmacy. No meningismus, no focal neuro deficits.  Some bizarre answers but oriented to person and place. No SI Or HI. Seen by Dr. Cyndie Chime and Dr. Burtis Junes (patient's PCP) who feel she is at her baseline.  She has chronic and difficult to control hypertension.  They feel her behavior is normal and workup reassuring.  She has an appointment scheduled on 5/29.   EKG Interpretation None       Glynn Octave, MD 10/24/13 1252  Glynn Octave, MD 10/24/13 [id number]

## 2013-10-25 ENCOUNTER — Emergency Department (HOSPITAL_COMMUNITY)
Admission: EM | Admit: 2013-10-25 | Discharge: 2013-10-25 | Disposition: A | Discharge status: Home / Self Care | Payer: Medicaid Other | Attending: Emergency Medicine | Admitting: Emergency Medicine

## 2013-10-25 ENCOUNTER — Ambulatory Visit: Payer: Medicaid Other

## 2013-10-25 ENCOUNTER — Other Ambulatory Visit: Payer: Medicaid Other

## 2013-10-25 ENCOUNTER — Encounter (HOSPITAL_COMMUNITY): Payer: Self-pay | Admitting: Emergency Medicine

## 2013-10-25 ENCOUNTER — Encounter: Payer: Self-pay | Admitting: Internal Medicine

## 2013-10-25 ENCOUNTER — Other Ambulatory Visit: Payer: Self-pay

## 2013-10-25 ENCOUNTER — Ambulatory Visit (INDEPENDENT_AMBULATORY_CARE_PROVIDER_SITE_OTHER): Payer: Medicaid Other | Admitting: Internal Medicine

## 2013-10-25 VITALS — BP 185/89 | HR 83 | Temp 97.8°F | Ht 65.0 in | Wt 210.4 lb

## 2013-10-25 DIAGNOSIS — R61 Generalized hyperhidrosis: Secondary | ICD-10-CM | POA: Insufficient documentation

## 2013-10-25 DIAGNOSIS — Z862 Personal history of diseases of the blood and blood-forming organs and certain disorders involving the immune mechanism: Secondary | ICD-10-CM | POA: Insufficient documentation

## 2013-10-25 DIAGNOSIS — Z21 Asymptomatic human immunodeficiency virus [HIV] infection status: Secondary | ICD-10-CM | POA: Insufficient documentation

## 2013-10-25 DIAGNOSIS — Z794 Long term (current) use of insulin: Secondary | ICD-10-CM | POA: Insufficient documentation

## 2013-10-25 DIAGNOSIS — E1169 Type 2 diabetes mellitus with other specified complication: Secondary | ICD-10-CM | POA: Insufficient documentation

## 2013-10-25 DIAGNOSIS — Z8619 Personal history of other infectious and parasitic diseases: Secondary | ICD-10-CM | POA: Insufficient documentation

## 2013-10-25 DIAGNOSIS — Z87448 Personal history of other diseases of urinary system: Secondary | ICD-10-CM | POA: Insufficient documentation

## 2013-10-25 DIAGNOSIS — E162 Hypoglycemia, unspecified: Secondary | ICD-10-CM

## 2013-10-25 DIAGNOSIS — I509 Heart failure, unspecified: Secondary | ICD-10-CM | POA: Insufficient documentation

## 2013-10-25 DIAGNOSIS — Z87891 Personal history of nicotine dependence: Secondary | ICD-10-CM | POA: Insufficient documentation

## 2013-10-25 DIAGNOSIS — F411 Generalized anxiety disorder: Secondary | ICD-10-CM | POA: Insufficient documentation

## 2013-10-25 DIAGNOSIS — Z8673 Personal history of transient ischemic attack (TIA), and cerebral infarction without residual deficits: Secondary | ICD-10-CM | POA: Insufficient documentation

## 2013-10-25 DIAGNOSIS — Z7982 Long term (current) use of aspirin: Secondary | ICD-10-CM | POA: Insufficient documentation

## 2013-10-25 DIAGNOSIS — R1011 Right upper quadrant pain: Secondary | ICD-10-CM | POA: Insufficient documentation

## 2013-10-25 DIAGNOSIS — G40909 Epilepsy, unspecified, not intractable, without status epilepticus: Secondary | ICD-10-CM | POA: Insufficient documentation

## 2013-10-25 DIAGNOSIS — R300 Dysuria: Secondary | ICD-10-CM

## 2013-10-25 DIAGNOSIS — Z79899 Other long term (current) drug therapy: Secondary | ICD-10-CM | POA: Insufficient documentation

## 2013-10-25 DIAGNOSIS — I129 Hypertensive chronic kidney disease with stage 1 through stage 4 chronic kidney disease, or unspecified chronic kidney disease: Secondary | ICD-10-CM | POA: Insufficient documentation

## 2013-10-25 DIAGNOSIS — R4182 Altered mental status, unspecified: Secondary | ICD-10-CM | POA: Insufficient documentation

## 2013-10-25 DIAGNOSIS — N189 Chronic kidney disease, unspecified: Secondary | ICD-10-CM | POA: Insufficient documentation

## 2013-10-25 DIAGNOSIS — R079 Chest pain, unspecified: Secondary | ICD-10-CM

## 2013-10-25 LAB — I-STAT TROPONIN, ED: TROPONIN I, POC: 0.02 ng/mL (ref 0.00–0.08)

## 2013-10-25 LAB — COMPREHENSIVE METABOLIC PANEL
ALBUMIN: 2.3 g/dL — AB (ref 3.5–5.2)
ALT: 105 U/L — ABNORMAL HIGH (ref 0–35)
AST: 79 U/L — ABNORMAL HIGH (ref 0–37)
Alkaline Phosphatase: 245 U/L — ABNORMAL HIGH (ref 39–117)
BILIRUBIN TOTAL: 1.5 mg/dL — AB (ref 0.3–1.2)
BUN: 68 mg/dL — AB (ref 6–23)
CHLORIDE: 104 meq/L (ref 96–112)
CO2: 20 meq/L (ref 19–32)
Calcium: 8.5 mg/dL (ref 8.4–10.5)
Creatinine, Ser: 2.33 mg/dL — ABNORMAL HIGH (ref 0.50–1.10)
GFR calc Af Amer: 26 mL/min — ABNORMAL LOW (ref >90)
GFR, EST NON AFRICAN AMERICAN: 23 mL/min — AB (ref >90)
GLUCOSE: 65 mg/dL — AB (ref 70–99)
Potassium: 4 mEq/L (ref 3.7–5.3)
Sodium: 140 mEq/L (ref 137–147)
Total Protein: 6.1 g/dL (ref 6.0–8.3)

## 2013-10-25 LAB — CBG MONITORING, ED
GLUCOSE-CAPILLARY: 20 mg/dL — AB (ref 70–99)
Glucose-Capillary: 83 mg/dL (ref 70–99)
Glucose-Capillary: 102 mg/dL — ABNORMAL HIGH (ref 70–99)
Glucose-Capillary: 134 mg/dL — ABNORMAL HIGH (ref 70–99)

## 2013-10-25 LAB — AMMONIA: AMMONIA: 90 umol/L — AB (ref 11–60)

## 2013-10-25 LAB — I-STAT CG4 LACTIC ACID, ED: Lactic Acid, Venous: 0.84 mmol/L (ref 0.5–2.2)

## 2013-10-25 LAB — PHENYTOIN LEVEL, TOTAL: Phenytoin Lvl: 7.4 ug/mL — ABNORMAL LOW (ref 10.0–20.0)

## 2013-10-25 MED ORDER — DEXTROSE 50 % IV SOLN
INTRAVENOUS | Status: AC
  Filled 2013-10-25: qty 50

## 2013-10-25 MED ORDER — DEXTROSE 50 % IV SOLN
1.0000 | Freq: Once | INTRAVENOUS | Status: AC
  Administered 2013-10-25: 50 mL via INTRAVENOUS

## 2013-10-25 NOTE — Patient Instructions (Signed)
Thank you for bringing your medicines today. This helps us keep you safe from mistakes.  

## 2013-10-25 NOTE — Progress Notes (Signed)
Subjective:    Patient ID: Michelle [name], female    DOB: 12-13-1960, 53 y.o.   MRN: [id number]  HPI [name] is a 53 yo woman pmh as listed below presents for ED follow up.   Pt was brought into Ed by her [facility] on 10/23/13 for some bizarre behavior. Pt had extensive workup that was negative and pt has had very erratic behavior during several visits and has refused psych evaluation and was even sent to psych ED where she was cleared for acute psychotic episode. Since that time she reports her mood has been stable and she is not having any audiotry or visual hallucinations.   In the middle of the interview pt started becoming diaphoretic after returning from teh bathroom earlier and then couldn't speak in complete sentences. Pt was wiped down with a wet cool cloth and given some cold water but continued to be diaphoretic. The interview was terminated and a quick physical exam was done and transport of [name] for evaluation.    Past Medical History  Diagnosis Date  . Seizures   . Stroke   . Meningitis   . HIV (human immunodeficiency virus infection)   . Hypertension   . Gout   . Muscle spasms of head and/or neck   . CKD (chronic kidney disease)   . CHF (congestive heart failure)     [name] 06/18/2013  . HCV (hepatitis C virus)     chronic/notes 06/18/2013  . Type II diabetes mellitus     [name] 06/18/2013  . AIHA (autoimmune hemolytic anemia)     [name] 06/18/2013  . Hypertensive encephalopathy ~ 05/2013    hospitalaized/notes 06/18/2013  . Daily headache     "for the last 6 years/notes 06/18/2013  . Exertional shortness of breath     [name] 06/18/2013  . Anxiety     [name] 06/18/2013  . Nephrotic syndrome   . History of syphilis   . High cholesterol    Current Outpatient Prescriptions on File Prior to Visit  Medication Sig Dispense Refill  . abacavir (ZIAGEN) 300 MG tablet Take 300 mg by mouth 2 (two) times daily.      [name] acyclovir (ZOVIRAX) 800 MG tablet Take 800 mg by  mouth 5 (five) times daily.      [name] aspirin EC 81 MG tablet Take 1 tablet (81 mg total) by mouth daily.  30 tablet  11  . Blood Glucose Monitoring Suppl (ACCU-CHEK AVIVA PLUS) W/DEVICE KIT USE AS DIRECTED  1 kit  0  . cholecalciferol (VITAMIN D) 1000 UNITS tablet Take 1,000 Units by mouth daily.      . cloNIDine (CATAPRES) 0.3 MG tablet Take 1 tablet (0.3 mg total) by mouth 3 (three) times daily.  90 tablet  0  . cyclobenzaprine (FLEXERIL) 5 MG tablet Take 1 tablet (5 mg total) by mouth daily as needed for muscle spasms.  10 tablet  0  . Darunavir Ethanolate (PREZISTA) 800 MG tablet Take 1 tablet (800 mg total) by mouth daily with breakfast.  30 tablet  11  . diphenhydrAMINE (BENADRYL) 25 MG tablet Take 25 mg by mouth every 6 (six) hours as needed for itching.      . furosemide (LASIX) 80 MG tablet Take 1 tablet (80 mg total) by mouth 2 (two) times daily.  60 tablet  1  . gentamicin (GARAMYCIN) 0.3 % ophthalmic ointment Place 1 application into both eyes 4 (four) times daily.      [name]  hydrALAZINE (APRESOLINE) 50 MG tablet Take 2 tablets (100 mg total) by mouth 3 (three) times daily.  90 tablet  1  . insulin aspart (NOVOLOG) 100 UNIT/ML injection Inject 7 Units into the skin 3 (three) times daily with meals. Based on sliding scale  10 mL  1  . insulin glargine (LANTUS) 100 UNIT/ML injection Inject 0.3 mLs (30 Units total) into the skin at bedtime.  10 mL  2  . labetalol (NORMODYNE) 300 MG tablet Take 2 tablets (600 mg total) by mouth 2 (two) times daily.  60 tablet  1  . lamiVUDine (EPIVIR) 150 MG tablet Take 1 tablet (150 mg total) by mouth daily.  30 tablet  6  . levETIRAcetam (KEPPRA) 500 MG tablet Take 3 tablets (1,500 mg total) by mouth 2 (two) times daily.  120 tablet  1  . lisinopril (PRINIVIL,ZESTRIL) 40 MG tablet Take 1 tablet (40 mg total) by mouth daily.  90 tablet  3  . methocarbamol (ROBAXIN) 500 MG tablet Take 500 mg by mouth 2 (two) times daily as needed for muscle spasms.      [name]  oxyCODONE-acetaminophen (PERCOCET) 10-325 MG per tablet Take 1 tablet by mouth every 4 (four) hours as needed for pain.  20 tablet  0  . pantoprazole (PROTONIX) 40 MG tablet Take 1 tablet (40 mg total) by mouth daily.  30 tablet  11  . phenytoin (DILANTIN) 30 MG ER capsule Take 1 capsule (30 mg total) by mouth at bedtime.  30 capsule  1  . phenytoin (DILANTIN) 300 MG ER capsule Take 1 capsule (300 mg total) by mouth at bedtime.  30 capsule  1  . phenytoin (DILANTIN) 300 MG ER capsule Take 1 capsule (300 mg total) by mouth daily.  30 capsule  1  . potassium chloride (K-DUR,KLOR-CON) 10 MEQ tablet Take 1 tablet (10 mEq total) by mouth daily.  30 tablet  1  . predniSONE (DELTASONE) 20 MG tablet Take 1.5 tablets (30 mg total) by mouth daily with breakfast.  30 tablet  1  . ritonavir (NORVIR) 100 MG TABS tablet Take 1 tablet (100 mg total) by mouth daily with breakfast.  30 tablet  11   No current facility-administered medications on file prior to visit.   Social, surgical, family history reviewed with patient and updated in appropriate chart locations.   Review of Systems     Objective:   Physical Exam Filed Vitals:   10/25/13 1552  BP: 185/89  Pulse: 83  Temp: 97.8 F (36.6 C)   General: sitting in wheelchair, NAD, diaphoretic, unable to finish speaking in full sentences   HEENT: PERRL, EOMI, no scleral icterus Cardiac: tachy, RR, no rubs, murmurs or gallops Pulm: some decreased BS in bilateral bases, expiratory wheezes throughtout, moving normal volumes of air, repeat pulse O2 showed sats in 90s Abd: soft, ttp over RUQ, distended,BS present Ext: warm and well perfused, LE weeping of clear fluids from legs bilaterally, 3+ pitting pedal edema to shins bilaterally  Neuro: alert and oriented X3, cranial nerves II-XII grossly intact     Assessment & Plan:  Pt was transported to ED for further evaluation given continued diaphoresis and SOB that limited speech.    Pt discussed with Dr.  Stann Mainland

## 2013-10-25 NOTE — ED Notes (Signed)
Dr. Ronald Lobo informed of BP 206/100 no further orders may discharge pt  Per Dr. Ronald Lobo

## 2013-10-25 NOTE — ED Notes (Addendum)
Pt meal tray ordered.

## 2013-10-25 NOTE — ED Provider Notes (Signed)
CSN: [id number]     Arrival date & time 10/25/13  1643 History   First MD Initiated Contact with Patient 10/25/13 1720     Chief Complaint  Patient presents with  . Chest Pain     In addition to what is as below. Patient is a 53 year old female with past medical history of seizures, HIV, last CD4 count of 280 in April of 2015, hepatitis C, type 2 diabetes, and nephrotic syndrome who presents with complaints of chest pain. Per documentation patient was being evaluated at internal medicine teaching clinic. There he was she was noted to be diaphoretic altered and complaining of chest pain. She was sent to the emergency department. Blood sugar noted to be 20 upon arrival. She was given an amp of D50. Once brought back to bed patient fully alert and oriented x3 and this has complaints of generalized fatigue.  Patient reports she took 20 units of novolog and had only small amount of "beans" for lunch.  She states she was waiting for after her appointment to ear at [facility].      (Consider location/radiation/quality/duration/timing/severity/associated sxs/prior Treatment) The history is provided by the patient and medical records. No language interpreter was used.    Past Medical History  Diagnosis Date  . Seizures   . Stroke   . Meningitis   . HIV (human immunodeficiency virus infection)   . Hypertension   . Gout   . Muscle spasms of head and/or neck   . CKD (chronic kidney disease)   . CHF (congestive heart failure)     [name] 06/18/2013  . HCV (hepatitis C virus)     chronic/notes 06/18/2013  . Type II diabetes mellitus     [name] 06/18/2013  . AIHA (autoimmune hemolytic anemia)     [name] 06/18/2013  . Hypertensive encephalopathy ~ 05/2013    hospitalaized/notes 06/18/2013  . Daily headache     "for the last 6 years/notes 06/18/2013  . Exertional shortness of breath     [name] 06/18/2013  . Anxiety     [name] 06/18/2013  . Nephrotic syndrome   . History of syphilis   . High  cholesterol    Past Surgical History  Procedure Laterality Date  . Hip pinning Right   . Av fistula placement Right 07/24/2013    Procedure: RIGHT arm exploration of antecubital space;  Surgeon: Elam Dutch, MD;  Location: [facility] OR;  Service: Vascular;  Laterality: Right;   Family History  Problem Relation Age of Onset  . Cancer - Colon Mother   . Cancer Father   . Hypertension Father   . Diabetes    . Diabetes Sister    History  Substance Use Topics  . Smoking status: Former Smoker    Types: Cigarettes    Quit date: 06/19/2010  . Smokeless tobacco: Never Used  . Alcohol Use: No   OB History   Grav Para Term Preterm Abortions TAB SAB Ect Mult Living                 Review of Systems  All other systems reviewed and are negative.     Allergies  Ceftriaxone; Norvasc; and Morphine and related  Home Medications   Prior to Admission medications   Medication Sig Start Date End Date Taking? Authorizing Provider  abacavir (ZIAGEN) 300 MG tablet Take 300 mg by mouth 2 (two) times daily.    Historical Provider, MD  acyclovir (ZOVIRAX) 800 MG tablet Take 800 mg by mouth 5 (five)  times daily.    Historical Provider, MD  aspirin EC 81 MG tablet Take 1 tablet (81 mg total) by mouth daily. 06/02/13   Sarah Cater, MD  Blood Glucose Monitoring Suppl (ACCU-CHEK AVIVA PLUS) W/DEVICE KIT USE AS DIRECTED 09/04/13   Lawrence D Klima, MD  cholecalciferol (VITAMIN D) 1000 UNITS tablet Take 1,000 Units by mouth daily.    Historical Provider, MD  cloNIDine (CATAPRES) 0.3 MG tablet Take 1 tablet (0.3 mg total) by mouth 3 (three) times daily. 10/14/13 10/14/14  Tracy N McLean, MD  cyclobenzaprine (FLEXERIL) 5 MG tablet Take 1 tablet (5 mg total) by mouth daily as needed for muscle spasms. 09/09/13   Eden W Jones, MD  Darunavir Ethanolate (PREZISTA) 800 MG tablet Take 1 tablet (800 mg total) by mouth daily with breakfast. 09/26/13   Cynthia Snider, MD  diphenhydrAMINE (BENADRYL) 25 MG tablet Take 25 mg  by mouth every 6 (six) hours as needed for itching.    Historical Provider, MD  furosemide (LASIX) 80 MG tablet Take 1 tablet (80 mg total) by mouth 2 (two) times daily. 10/14/13   Tracy N McLean, MD  gentamicin (GARAMYCIN) 0.3 % ophthalmic ointment Place 1 application into both eyes 4 (four) times daily.    Historical Provider, MD  hydrALAZINE (APRESOLINE) 50 MG tablet Take 2 tablets (100 mg total) by mouth 3 (three) times daily. 10/14/13   Tracy N McLean, MD  insulin aspart (NOVOLOG) 100 UNIT/ML injection Inject 7 Units into the skin 3 (three) times daily with meals. Based on sliding scale 10/14/13   Tracy N McLean, MD  insulin glargine (LANTUS) 100 UNIT/ML injection Inject 0.3 mLs (30 Units total) into the skin at bedtime. 10/14/13   Tracy N McLean, MD  labetalol (NORMODYNE) 300 MG tablet Take 2 tablets (600 mg total) by mouth 2 (two) times daily. 10/14/13   Tracy N McLean, MD  lamiVUDine (EPIVIR) 150 MG tablet Take 1 tablet (150 mg total) by mouth daily. 06/20/13   Sarah Cater, MD  levETIRAcetam (KEPPRA) 500 MG tablet Take 3 tablets (1,500 mg total) by mouth 2 (two) times daily. 10/14/13   Tracy N McLean, MD  lisinopril (PRINIVIL,ZESTRIL) 40 MG tablet Take 1 tablet (40 mg total) by mouth daily. 09/04/13   Lawrence D Klima, MD  methocarbamol (ROBAXIN) 500 MG tablet Take 500 mg by mouth 2 (two) times daily as needed for muscle spasms.    Historical Provider, MD  oxyCODONE-acetaminophen (PERCOCET) 10-325 MG per tablet Take 1 tablet by mouth every 4 (four) hours as needed for pain. 09/20/13   Nora Sadek, MD  pantoprazole (PROTONIX) 40 MG tablet Take 1 tablet (40 mg total) by mouth daily. 06/02/13   Sarah Cater, MD  phenytoin (DILANTIN) 30 MG ER capsule Take 1 capsule (30 mg total) by mouth at bedtime. 10/14/13   Tracy N McLean, MD  phenytoin (DILANTIN) 300 MG ER capsule Take 1 capsule (300 mg total) by mouth at bedtime. 10/14/13   Tracy N McLean, MD  phenytoin (DILANTIN) 300 MG ER capsule Take 1 capsule (300 mg  total) by mouth daily. 10/14/13   Tracy N McLean, MD  potassium chloride (K-DUR,KLOR-CON) 10 MEQ tablet Take 1 tablet (10 mEq total) by mouth daily. 10/14/13   Tracy N McLean, MD  predniSONE (DELTASONE) 20 MG tablet Take 1.5 tablets (30 mg total) by mouth daily with breakfast. 10/14/13   Tracy N McLean, MD  ritonavir (NORVIR) 100 MG TABS tablet Take 1 tablet (100 mg total) by mouth daily with   breakfast. 09/26/13   Cynthia Snider, MD   BP 220/87  Pulse 78  Resp 24  SpO2 99% Physical Exam  Nursing note and vitals reviewed. Constitutional: She is oriented to person, place, and time. She appears well-developed and well-nourished.  HENT:  Head: Normocephalic and atraumatic.  Right Ear: External ear normal.  Left Ear: External ear normal.  Nose: Nose normal.  Eyes: Conjunctivae are normal. Pupils are equal, round, and reactive to light.  Neck: Normal range of motion. Neck supple. No thyromegaly present.  Cardiovascular: Normal rate and regular rhythm.   Pulmonary/Chest: Effort normal and breath sounds normal.  Abdominal: Soft. Bowel sounds are normal. She exhibits no distension and no mass. There is tenderness (mild epgiastric and RUQ TTP.  ). There is no rebound and no guarding.  Musculoskeletal: Normal range of motion. She exhibits edema (2+ BLE edema).  Neurological: She is alert and oriented to person, place, and time. No cranial nerve deficit.  Skin: Skin is warm and dry.  Psychiatric: She has a normal mood and affect.    ED Course  Procedures (including critical care time) Labs Review Labs Reviewed  COMPREHENSIVE METABOLIC PANEL - Abnormal; Notable for the following:    Glucose, Bld 65 (*)    BUN 68 (*)    Creatinine, Ser 2.33 (*)    Albumin 2.3 (*)    AST 79 (*)    ALT 105 (*)    Alkaline Phosphatase 245 (*)    Total Bilirubin 1.5 (*)    GFR calc non Af Amer 23 (*)    GFR calc Af Amer 26 (*)    All other components within normal limits  AMMONIA - Abnormal; Notable for the  following:    Ammonia 90 (*)    All other components within normal limits  PHENYTOIN LEVEL, TOTAL - Abnormal; Notable for the following:    Phenytoin Lvl 7.4 (*)    All other components within normal limits  CBG MONITORING, ED - Abnormal; Notable for the following:    Glucose-Capillary 20 (*)    All other components within normal limits  CBG MONITORING, ED - Abnormal; Notable for the following:    Glucose-Capillary 102 (*)    All other components within normal limits  CBG MONITORING, ED - Abnormal; Notable for the following:    Glucose-Capillary 134 (*)    All other components within normal limits  I-STAT TROPOININ, ED  CBG MONITORING, ED  I-STAT CG4 LACTIC ACID, ED    Imaging Review No results found.   EKG Interpretation None      MDM   Final diagnoses:  Hypoglycemia    Patient presents via PCP clinic for altered mental status, chest pain, and diaphoresis. Upon arrival in the emergency department she was found to be hypoglycemic with a blood sugar of 20. She was given D50. During my exam patient was alert and oriented x3 and has no complaints. Labs obtained in triage were remarkable for a baseline elevated creatinine and BUN, and other abnormalities were at baseline. Patient was able to tolerate by mouth intake without any difficulty. Repeat blood sugars were stable above 100 and she remained without any symptoms. It was felt that symptoms were likely related to episode of hypoglycemia especially given after correction her she was asymptomatic. Likelt etiology of patient taking novolog around lunch and had little po intake. We'll discharge with return precautions and PCP follow up.    , MD 10/26/13 0042 

## 2013-10-25 NOTE — Discharge Instructions (Signed)

## 2013-10-25 NOTE — ED Notes (Signed)
CBG 83 

## 2013-10-25 NOTE — ED Notes (Addendum)
Pt was brought to [place] from the internal medicine clinics with a visit for chest pain. Pt presents to [place] lethargic, unable to answer questions well or with much detail with out falling asleep. Diaphoretic. She reports that her head and "side hurt. BP 220/87. CBG 20-given 1 amp d5

## 2013-10-26 LAB — URINALYSIS, ROUTINE W REFLEX MICROSCOPIC
Bilirubin Urine: NEGATIVE
Glucose, UA: NEGATIVE mg/dL
HGB URINE DIPSTICK: NEGATIVE
Ketones, ur: NEGATIVE mg/dL
LEUKOCYTES UA: NEGATIVE
NITRITE: NEGATIVE
Protein, ur: >300 mg/dL — AB
Specific Gravity, Urine: 1.006 (ref 1.005–1.030)
Urobilinogen, UA: 1 mg/dL (ref 0.0–1.0)
pH: 6 (ref 5.0–8.0)

## 2013-10-26 LAB — PRESCRIPTION ABUSE MONITORING 15P, URINE
AMPHETAMINE/METH: NEGATIVE ng/mL
BENZODIAZEPINE SCREEN, URINE: NEGATIVE ng/mL
BUPRENORPHINE, URINE: NEGATIVE ng/mL
Barbiturate Screen, Urine: NEGATIVE ng/mL
CARISOPRODOL, URINE: NEGATIVE ng/mL
Cannabinoid Scrn, Ur: NEGATIVE ng/mL
Cocaine Metabolites: NEGATIVE ng/mL
Creatinine, Urine: 29.77 mg/dL (ref >20.0)
Meperidine, Ur: NEGATIVE ng/mL
Methadone Screen, Urine: NEGATIVE ng/mL
OPIATE SCREEN, URINE: NEGATIVE ng/mL
Propoxyphene: NEGATIVE ng/mL
TRAMADOL UR: NEGATIVE ng/mL
Zolpidem, Urine: NEGATIVE ng/mL

## 2013-10-26 LAB — URINALYSIS, MICROSCOPIC ONLY
Bacteria, UA: NONE SEEN
CASTS: NONE SEEN
Crystals: NONE SEEN
Squamous Epithelial / LPF: NONE SEEN

## 2013-10-26 NOTE — ED Provider Notes (Signed)
I saw and evaluated the patient, reviewed the resident's note and I agree with the findings and plan.   EKG Interpretation None      Patient with sweating and poor appearance in clinic, found to be very hypoglycemic in ED. All of her symptoms resolved with D50 and food. Maintained her glucose in ED. Questionable history of chest pain, but she does not have any now and cannot remember what it felt like. I have low suspicion this is ACS, PE or dissection given this history. Will give return precautions and discharge.   Audree Camel, MD 10/26/13 [phone]

## 2013-10-29 ENCOUNTER — Other Ambulatory Visit: Payer: Self-pay | Admitting: *Deleted

## 2013-10-29 NOTE — Progress Notes (Signed)
Case discussed with Dr. Sadek soon after the resident saw the patient.  We reviewed the resident's history and exam and pertinent patient test results.  I agree with the assessment, diagnosis, and plan of care documented in the resident's note. 

## 2013-10-29 NOTE — Telephone Encounter (Signed)
Pt states is having muscle spasms - needs Flexeril 5mg  PRN also. Thanks

## 2013-10-29 NOTE — Telephone Encounter (Signed)
Patient on too many neuroleptic drugs. Will not refill this medications. Thanks.

## 2013-10-30 LAB — FENTANYL (GC/LC/MS), URINE
Fentanyl, confirm: NEGATIVE ng/mL (ref <0.5)
Norfentanyl, confirm: NEGATIVE ng/mL (ref <0.5)

## 2013-10-30 LAB — OXYCODONE, URINE (LC/MS-MS)
NOROXYCODONE, UR: 81 ng/mL — AB (ref <50)
OXYCODONE, UR: 94 ng/mL — AB (ref <50)
Oxymorphone: NEGATIVE ng/mL (ref <50)

## 2013-10-30 NOTE — Telephone Encounter (Signed)
Pt called/informed of rxs refill denials - scheduled an appt on Friday to discuss w/Dr [facility].

## 2013-11-01 ENCOUNTER — Ambulatory Visit (INDEPENDENT_AMBULATORY_CARE_PROVIDER_SITE_OTHER): Payer: Medicaid Other | Admitting: Internal Medicine

## 2013-11-01 ENCOUNTER — Encounter: Payer: Self-pay | Admitting: Internal Medicine

## 2013-11-01 VITALS — BP 162/79 | HR 88 | Temp 99.6°F | Ht 65.0 in | Wt 205.7 lb

## 2013-11-01 DIAGNOSIS — E119 Type 2 diabetes mellitus without complications: Secondary | ICD-10-CM

## 2013-11-01 DIAGNOSIS — R911 Solitary pulmonary nodule: Secondary | ICD-10-CM

## 2013-11-01 DIAGNOSIS — Z5320 Procedure and treatment not carried out because of patient's decision for unspecified reasons: Secondary | ICD-10-CM

## 2013-11-01 LAB — GLUCOSE, CAPILLARY: Glucose-Capillary: 318 mg/dL — ABNORMAL HIGH (ref 70–99)

## 2013-11-01 MED ORDER — ACCU-CHEK SOFTCLIX LANCET DEV MISC: Status: DC

## 2013-11-01 NOTE — Assessment & Plan Note (Signed)
Patient was denied narcotic prescription or renewal at this time given many episodes of altered mental status and concern of family members stealing opiates. Pt is also getting relief with tylenol and other homeopathic methods such as epsom salts that are more safe for the patient.  -cont heat, ice packs, epsom sitz baths

## 2013-11-01 NOTE — Progress Notes (Signed)
Subjective:    Patient ID: Michelle [name], female    DOB: 18-Jul-1960, 53 y.o.   MRN: [id number]  HPI Ms. Ritacco is a 53 yo woman pmh as listed below present for discussion regarding her pain medications.   The patient feels she needs narcotics. This is a grave concern given her many presentations of AMS and uncontrolled seizures (if were to give tramadol). The patient has also had recent suicide attempts and there has been concern that family members are stealing and selling the patient's medications. The patient is a horrible historian and can't safely manage her regular medications that cause more warning and concern for continuing narcotics. The patient was given narcotics previously because of upper extremity exploration for possible fistula placement and that pain should be resolved. The patient states she can receive relief from over-the-counter Tylenol and has on one occasion used Epsom salts with also some relief.  In terms of her diabetes the patient is using her insulin and still checking her sugars at least 3-4 times a day. Her cousin him checks on her is becoming more diligent in terms of monitoring her glycemic control and asks for sliding scale today. Patient brings in her meter that shows lowest of 49 and highest of 558 and average of 270. The patient had recent hypoglycemic episode that required ED attention but has not had any in the last week as confirmed by her meter and her caregiver.  In terms of her steroids the patient is now down to the 95m prednisone and has not noticed any significant fatigue or gross bleeding from urine, stool. The patient should follow-up with heme this month for re-eval.   Past Medical History  Diagnosis Date  . Seizures   . Stroke   . Meningitis   . HIV (human immunodeficiency virus infection)   . Hypertension   . Gout   . Muscle spasms of head and/or neck   . CKD (chronic kidney disease)   . CHF (congestive heart failure)     /[name]1/20/2015  .  HCV (hepatitis C virus)     chronic/notes 06/18/2013  . Type II diabetes mellitus     /[name]1/20/2015  . AIHA (autoimmune hemolytic anemia)     /[name]1/20/2015  . Hypertensive encephalopathy ~ 05/2013    hospitalaized/notes 06/18/2013  . Daily headache     "for the last 6 years/notes 06/18/2013  . Exertional shortness of breath     /[name]1/20/2015  . Anxiety     /[name]1/20/2015  . Nephrotic syndrome   . History of syphilis   . High cholesterol    Current Outpatient Prescriptions on File Prior to Visit  Medication Sig Dispense Refill  . abacavir (ZIAGEN) 300 MG tablet Take 300 mg by mouth 2 (two) times daily.      .Marland Kitchenacyclovir (ZOVIRAX) 800 MG tablet Take 800 mg by mouth daily.       .Marland Kitchenaspirin EC 81 MG tablet Take 1 tablet (81 mg total) by mouth daily.  30 tablet  11  . Blood Glucose Monitoring Suppl (ACCU-CHEK AVIVA PLUS) W/DEVICE KIT USE AS DIRECTED  1 kit  0  . cloNIDine (CATAPRES) 0.3 MG tablet Take 1 tablet (0.3 mg total) by mouth 3 (three) times daily.  90 tablet  0  . Darunavir Ethanolate (PREZISTA) 800 MG tablet Take 1 tablet (800 mg total) by mouth daily with breakfast.  30 tablet  11  . diphenhydrAMINE (BENADRYL) 25 MG tablet Take 25 mg  by mouth every 6 (six) hours as needed for itching.      . furosemide (LASIX) 80 MG tablet Take 1 tablet (80 mg total) by mouth 2 (two) times daily.  60 tablet  1  . gentamicin (GARAMYCIN) 0.3 % ophthalmic ointment Place 1 application into both eyes 4 (four) times daily.      . hydrALAZINE (APRESOLINE) 50 MG tablet Take 2 tablets (100 mg total) by mouth 3 (three) times daily.  90 tablet  1  . insulin aspart (NOVOLOG) 100 UNIT/ML injection Inject 10-25 Units into the skin 3 (three) times daily before meals. Based on sliding scale      . insulin glargine (LANTUS) 100 UNIT/ML injection Inject 0.3 mLs (30 Units total) into the skin at bedtime.  10 mL  2  . labetalol (NORMODYNE) 300 MG tablet Take 2 tablets (600 mg total) by mouth 2 (two) times  daily.  60 tablet  1  . lamiVUDine (EPIVIR) 150 MG tablet Take 1 tablet (150 mg total) by mouth daily.  30 tablet  6  . levETIRAcetam (KEPPRA) 500 MG tablet Take 3 tablets (1,500 mg total) by mouth 2 (two) times daily.  120 tablet  1  . lisinopril (PRINIVIL,ZESTRIL) 40 MG tablet Take 1 tablet (40 mg total) by mouth daily.  90 tablet  3  . Multiple Vitamin (MULTIVITAMIN WITH MINERALS) TABS tablet Take 1 tablet by mouth daily.      [name] oxyCODONE-acetaminophen (PERCOCET) 10-325 MG per tablet Take 1 tablet by mouth every 4 (four) hours as needed for pain.  20 tablet  0  . pantoprazole (PROTONIX) 40 MG tablet Take 1 tablet (40 mg total) by mouth daily.  30 tablet  11  . phenytoin (DILANTIN) 100 MG ER capsule Take 300 mg by mouth 2 (two) times daily.      . phenytoin (DILANTIN) 30 MG ER capsule Take 1 capsule (30 mg total) by mouth at bedtime.  30 capsule  1  . potassium chloride (K-DUR,KLOR-CON) 10 MEQ tablet Take 1 tablet (10 mEq total) by mouth daily.  30 tablet  1  . predniSONE (DELTASONE) 20 MG tablet Take 1.5 tablets (30 mg total) by mouth daily with breakfast.  30 tablet  1  . PRESCRIPTION MEDICATION Place 1 drop into both eyes 2 (two) times daily. Eye drops from the hospital      . ritonavir (NORVIR) 100 MG TABS tablet Take 1 tablet (100 mg total) by mouth daily with breakfast.  30 tablet  11  . triamcinolone cream (KENALOG) 0.1 % Apply 1 application topically 4 (four) times daily as needed (itching).        No current facility-administered medications on file prior to visit.   Social, surgical, family history reviewed with patient and updated in appropriate chart locations.   Review of Systems  History obtained from chart review and the patient General ROS: negative for - chills, fatigue, fever, night sweats or weight gain Endocrine ROS: negative for - palpitations, polydipsia/polyuria or skin changes Respiratory ROS: no cough, shortness of breath, or wheezing Cardiovascular ROS: no chest  pain or dyspnea on exertion Neurological ROS: negative for - confusion, dizziness, headaches, impaired coordination/balance, numbness/tingling or seizures Dermatological ROS: negative for rash     Objective:   Physical Exam Filed Vitals:   11/01/13 1430  BP: 162/79  Pulse: 88  Temp: 99.6 F (37.6 C)   General: sitting in wheelchair, NAD HEENT: PERRL, EOMI, no scleral icterus Cardiac: RRR, no rubs, murmurs or gallops Pulm:  clear to auscultation bilaterally, moving normal volumes of air Abd: soft, nontender, nondistended, BS present Ext: warm and well perfused, 1+ pedal edema, some excrations on LE, no pus or drainage, no other skin lesions Neuro: alert and oriented X3, cranial nerves II-XII grossly intact    Assessment & Plan:  Please see problem oriented charting  Pt discussed with Dr. Daryll Drown

## 2013-11-01 NOTE — Patient Instructions (Addendum)
It was nice to see you today.   For your diabetes:   If your sugar reads:   Less than 70:      O units  70-120 :     O units  121- 150:    1 unit  151- 200:   2 units  201-300:  5 units  301-350:  7 units  351-400:  9 units    Call Dr. Rosie Fate for your blood appointment: [phone]

## 2013-11-01 NOTE — Assessment & Plan Note (Addendum)
Pt had recent hypoglycemic event as low as 20 apparently as a result of "doubling up on her insulin" when she ate a sandwich before her appt. The patient seems to not have had any other repeat events since that time. She is maintaining descent glycemic control.  -no changes to insulin management -renal SSI was provided for patient and pt caregiver -f/u with Heme this month in regards to titration down of prednisone which should also help (pt at 30mg  daily at this time)

## 2013-11-02 NOTE — Assessment & Plan Note (Signed)
Pt has had an incidental pulm nodule seen 1/15 and has not been able to get f/u CT which should be addressed at next visit. This was discussed with the patient.

## 2013-11-07 NOTE — Progress Notes (Signed)
Case discussed with Dr. Sadek soon after the resident saw the patient.  We reviewed the resident's history and exam and pertinent patient test results.  I agree with the assessment, diagnosis, and plan of care documented in the resident's note. 

## 2013-11-08 ENCOUNTER — Other Ambulatory Visit: Payer: Self-pay | Admitting: Internal Medicine

## 2013-11-08 ENCOUNTER — Ambulatory Visit: Payer: Medicaid Other | Admitting: Internal Medicine

## 2013-11-08 ENCOUNTER — Encounter: Payer: Medicaid Other | Admitting: Internal Medicine

## 2013-11-11 NOTE — Progress Notes (Signed)
Patient ID: Michelle [name], female   DOB: 1961/03/31, 53 y.o.   MRN: [id number] erroneous encounter

## 2013-11-13 ENCOUNTER — Ambulatory Visit (INDEPENDENT_AMBULATORY_CARE_PROVIDER_SITE_OTHER): Payer: Medicaid Other | Admitting: Internal Medicine

## 2013-11-13 ENCOUNTER — Encounter: Payer: Self-pay | Admitting: Licensed Clinical Social Worker

## 2013-11-13 ENCOUNTER — Ambulatory Visit: Payer: Medicaid Other | Admitting: Internal Medicine

## 2013-11-13 ENCOUNTER — Telehealth: Payer: Self-pay | Admitting: Licensed Clinical Social Worker

## 2013-11-13 ENCOUNTER — Encounter: Payer: Self-pay | Admitting: Internal Medicine

## 2013-11-13 VITALS — BP 158/77 | HR 80 | Temp 97.6°F | Ht 65.5 in | Wt 195.0 lb

## 2013-11-13 DIAGNOSIS — I10 Essential (primary) hypertension: Secondary | ICD-10-CM

## 2013-11-13 DIAGNOSIS — E119 Type 2 diabetes mellitus without complications: Secondary | ICD-10-CM

## 2013-11-13 DIAGNOSIS — R4182 Altered mental status, unspecified: Secondary | ICD-10-CM

## 2013-11-13 LAB — POCT GLYCOSYLATED HEMOGLOBIN (HGB A1C): Hemoglobin A1C: 7.5

## 2013-11-13 LAB — GLUCOSE, CAPILLARY: GLUCOSE-CAPILLARY: 152 mg/dL — AB (ref 70–99)

## 2013-11-13 NOTE — Progress Notes (Signed)
Subjective:    Patient ID: Michelle [name], female    DOB: 03/26/1961, 53 y.o.   MRN: [id number]  HPI Michelle [name] is a 53 yo woman pmh as listed below here for medication reconciliation and AMS.   Pt was recently seen and evaluated by home health [name] who had called in earlier today to report that the patient had elevated blood pressures in the 230s/102 and was acting very strangely and "making a very weird nonsensical comments." A personal phone call was made and a message was left for the nurse to call to obtain further information. She was later able to call back and reported that the patient couldn't follow directions and was throwing out her mediation in a garbage bag out the door. At one point the patient told the nurse to "stop talking to me I am listening to the person talking to me from the radio and don't think that is because I am crazy." The nurse stated she placed notes all over the house to help remind the patient because in moments of fluctuating mentation the patient will not even realize she has medical problems. Many times the nurse reported that the patient would act agitated, confused, and even fall asleep in the middle of her conversation with her.   During the interview with the patient she states that she doesn't remember this provider, although this is the only provider this patient has seen usually on a weekly basis. And then within minutes she will say "no but I remember you and I trust you." The patient began stating a story that she had called her pharmacist asking if she had refills on her pain medicines called a cab and then went to the pharmacy to discover that her pain meds were not there. She states "yes I remember the pharmacist and the doctor has told me that I cannot take pain medication because they spike it was cocaine." She states "all of you are against me and I have made a plan to be able to find the one who was messing with my  medicines and then messing with my life." The patient makes multiple trips to the bathroom and then will begin singing songs, dancing, and speaking erratically. "I'm done with talking, I am taking the fifth and I need my lawyer."  As supplemental/collateral information the following people were contacted:  Her niece/cousin, Michelle [name], whom comes to all the patient's medical visits and takes care and checks on the patient said that over the weekend the patient collected all her medications and threw them into a garbage bag out the window stating "I have to develop a plan you all are spiking my medicine." she has also been acting very angry and will yell at her and shut the phone and then call her within minutes later asking her for help and not remembering the previous conversation. During these times the niece feels that is when she has hyperglycemia but she checked her sugars and they were normal in the 150s to 200 range.  A personal call was made to the patient's case manager Michelle [name] to ask about reports that other home health nurses have made or that she has documented in terms of the patient's recent behavior.she states that the patient has several times made inappropriate comments to her such as one incident she called the patient to check on her and the patient then replied "I need you to hang up and not talk  to me right now and listen to my answering machine because I think my HIV is acting up." The case manager then did call back and the patient picked up right away not remembering that she had given the case manager directions to listen to her phone message. The case manager had also made an initial home visit with the patient and found a nurse from some personal care services there at the home. She described the home as still clean and somewhat organized but that the patient would have intermittent profound episodes of altered mental status fluctuating with lucidity. It has been documented that  the patient has not been taking pills appropriately and when her cousin/niece has been contacted that she goes out of the home and finds that the patient has normal blood sugars and normal blood pressure. It is felt by the case manager that the patient needs a higher level of care because even on occasion she has found that the patient needed assistance with getting groceries and was referred to community programs but the patient was unable to follow through. The case manager does feel that Michelle [name] is very supportive and does check on the patient but not as regular as the patient needs.  Past Medical History  Diagnosis Date  . Seizures   . Stroke   . Meningitis   . HIV (human immunodeficiency virus infection)   . Hypertension   . Gout   . Muscle spasms of head and/or neck   . CKD (chronic kidney disease)   . CHF (congestive heart failure)     Michelle [name] 06/18/2013  . HCV (hepatitis C virus)     chronic/notes 06/18/2013  . Type II diabetes mellitus     Michelle [name] 06/18/2013  . AIHA (autoimmune hemolytic anemia)     Michelle [name] 06/18/2013  . Hypertensive encephalopathy ~ 05/2013    hospitalaized/notes 06/18/2013  . Daily headache     "for the last 6 years/notes 06/18/2013  . Exertional shortness of breath     Michelle [name] 06/18/2013  . Anxiety     Michelle [name] 06/18/2013  . Nephrotic syndrome   . History of syphilis   . High cholesterol    Current Outpatient Prescriptions on File Prior to Visit  Medication Sig Dispense Refill  . abacavir (ZIAGEN) 300 MG tablet Take 300 mg by mouth 2 (two) times daily.      [name] acyclovir (ZOVIRAX) 800 MG tablet Take 800 mg by mouth daily.       [name] aspirin EC 81 MG tablet Take 1 tablet (81 mg total) by mouth daily.  30 tablet  11  . Blood Glucose Monitoring Suppl (ACCU-CHEK AVIVA PLUS) W/DEVICE KIT USE AS DIRECTED  1 kit  0  . cloNIDine (CATAPRES) 0.3 MG tablet Take 1 tablet (0.3 mg total) by mouth 3 (three) times daily.  90 tablet  0  . Darunavir Ethanolate (PREZISTA) 800 MG tablet  Take 1 tablet (800 mg total) by mouth daily with breakfast.  30 tablet  11  . diphenhydrAMINE (BENADRYL) 25 MG tablet Take 25 mg by mouth every 6 (six) hours as needed for itching.      . furosemide (LASIX) 80 MG tablet Take 1 tablet (80 mg total) by mouth 2 (two) times daily.  60 tablet  1  . gentamicin (GARAMYCIN) 0.3 % ophthalmic ointment Place 1 application into both eyes 4 (four) times daily.      . hydrALAZINE (APRESOLINE) 50 MG tablet Take 2 tablets (100 mg total) by mouth 3 (three)  times daily.  90 tablet  1  . insulin aspart (NOVOLOG) 100 UNIT/ML injection Inject 10-25 Units into the skin 3 (three) times daily before meals. Based on sliding scale      . insulin glargine (LANTUS) 100 UNIT/ML injection Inject 0.3 mLs (30 Units total) into the skin at bedtime.  10 mL  2  . labetalol (NORMODYNE) 300 MG tablet Take 2 tablets (600 mg total) by mouth 2 (two) times daily.  60 tablet  1  . lamiVUDine (EPIVIR) 150 MG tablet Take 1 tablet (150 mg total) by mouth daily.  30 tablet  6  . Lancet Devices (ACCU-CHEK SOFTCLIX) lancets Use as instructed  1 each  0  . levETIRAcetam (KEPPRA) 500 MG tablet Take 3 tablets (1,500 mg total) by mouth 2 (two) times daily.  120 tablet  1  . lisinopril (PRINIVIL,ZESTRIL) 40 MG tablet Take 1 tablet (40 mg total) by mouth daily.  90 tablet  3  . Multiple Vitamin (MULTIVITAMIN WITH MINERALS) TABS tablet Take 1 tablet by mouth daily.      . pantoprazole (PROTONIX) 40 MG tablet Take 1 tablet (40 mg total) by mouth daily.  30 tablet  11  . phenytoin (DILANTIN) 100 MG ER capsule Take 300 mg by mouth 2 (two) times daily.      . phenytoin (DILANTIN) 30 MG ER capsule Take 1 capsule (30 mg total) by mouth at bedtime.  30 capsule  1  . potassium chloride (K-DUR,KLOR-CON) 10 MEQ tablet Take 1 tablet (10 mEq total) by mouth daily.  30 tablet  1  . predniSONE (DELTASONE) 20 MG tablet Take 1.5 tablets (30 mg total) by mouth daily with breakfast.  30 tablet  1  . PRESCRIPTION  MEDICATION Place 1 drop into both eyes 2 (two) times daily. Eye drops from the hospital      . ritonavir (NORVIR) 100 MG TABS tablet Take 1 tablet (100 mg total) by mouth daily with breakfast.  30 tablet  11  . triamcinolone cream (KENALOG) 0.1 % Apply 1 application topically 4 (four) times daily as needed (itching).        No current facility-administered medications on file prior to visit.   Social, surgical, family history reviewed with patient and updated in appropriate chart locations.   Review of Systems  can not be fully obtained given patient erratic behavior and lack of orientation and concentration     Objective:   Physical Exam Filed Vitals:   11/13/13 1331  BP: 158/77  Pulse: 80  Temp: 97.6 F (36.4 C)   General: sitting in chair, fidgety Cardiac: RRR, no rubs, murmurs or gallops Pulm: clear to auscultation bilaterally, moving normal volumes of air Abd: soft, nontender, nondistended, BS present Ext: warm and well perfused, no pedal edema Neuro: alert and oriented only to self, cranial nerves II-XII grossly intact Psych: appears paranoid, will break out into dancing, will say she needs a lawyer because people are talking to her and doesn't want to speak to this provider any more, we'll then get angry and start using profanity at this provider, seems to be responding to some internal stimuli    Assessment & Plan:  Please see problem oriented charting  Pt discussed with Dr. Beryle Beams

## 2013-11-13 NOTE — Progress Notes (Signed)
Patient ID: Michelle [name], female   DOB: 1961-03-29, 53 y.o.   MRN: [id number] CSW provided current information and concerns to [facility] Manager, Domingo Mend, RN.  Pt is still active with [facility].   CMIS response from [facility] Care Manager 11/13/13:  I did a home visit on 10/24/13 after her discharge from the hospital. We reviewed her current medication regimen @ that time. I have been communicating with her cousin [name] recently about her care, d/t her fluctuating mental status. However, I think she might benefit from home health services for medication management.

## 2013-11-13 NOTE — Telephone Encounter (Signed)
CSW placed call to Ms. Botkin to determine current services and potential services pt would benefit from.  Pt has an appointment scheduled today at [facility] CSW hours.  PCP will discuss referral for [facility] RN for pill box management, confirm PCS agency.

## 2013-11-14 ENCOUNTER — Encounter (HOSPITAL_COMMUNITY): Payer: Self-pay | Admitting: Emergency Medicine

## 2013-11-14 ENCOUNTER — Emergency Department (HOSPITAL_COMMUNITY)
Admission: EM | Admit: 2013-11-14 | Discharge: 2013-11-14 | Disposition: A | Discharge status: Home / Self Care | Payer: Medicaid Other | Attending: Emergency Medicine | Admitting: Emergency Medicine

## 2013-11-14 DIAGNOSIS — E119 Type 2 diabetes mellitus without complications: Secondary | ICD-10-CM | POA: Insufficient documentation

## 2013-11-14 DIAGNOSIS — G40909 Epilepsy, unspecified, not intractable, without status epilepticus: Secondary | ICD-10-CM | POA: Insufficient documentation

## 2013-11-14 DIAGNOSIS — I509 Heart failure, unspecified: Secondary | ICD-10-CM | POA: Insufficient documentation

## 2013-11-14 DIAGNOSIS — Z87891 Personal history of nicotine dependence: Secondary | ICD-10-CM | POA: Insufficient documentation

## 2013-11-14 DIAGNOSIS — Z862 Personal history of diseases of the blood and blood-forming organs and certain disorders involving the immune mechanism: Secondary | ICD-10-CM | POA: Insufficient documentation

## 2013-11-14 DIAGNOSIS — F411 Generalized anxiety disorder: Secondary | ICD-10-CM | POA: Insufficient documentation

## 2013-11-14 DIAGNOSIS — Z8673 Personal history of transient ischemic attack (TIA), and cerebral infarction without residual deficits: Secondary | ICD-10-CM | POA: Insufficient documentation

## 2013-11-14 DIAGNOSIS — R21 Rash and other nonspecific skin eruption: Secondary | ICD-10-CM | POA: Insufficient documentation

## 2013-11-14 DIAGNOSIS — Z8619 Personal history of other infectious and parasitic diseases: Secondary | ICD-10-CM | POA: Insufficient documentation

## 2013-11-14 DIAGNOSIS — Z794 Long term (current) use of insulin: Secondary | ICD-10-CM | POA: Insufficient documentation

## 2013-11-14 DIAGNOSIS — Z21 Asymptomatic human immunodeficiency virus [HIV] infection status: Secondary | ICD-10-CM | POA: Insufficient documentation

## 2013-11-14 DIAGNOSIS — I129 Hypertensive chronic kidney disease with stage 1 through stage 4 chronic kidney disease, or unspecified chronic kidney disease: Secondary | ICD-10-CM | POA: Insufficient documentation

## 2013-11-14 DIAGNOSIS — Z79899 Other long term (current) drug therapy: Secondary | ICD-10-CM | POA: Insufficient documentation

## 2013-11-14 DIAGNOSIS — Z7982 Long term (current) use of aspirin: Secondary | ICD-10-CM | POA: Insufficient documentation

## 2013-11-14 DIAGNOSIS — R410 Disorientation, unspecified: Secondary | ICD-10-CM | POA: Insufficient documentation

## 2013-11-14 DIAGNOSIS — N189 Chronic kidney disease, unspecified: Secondary | ICD-10-CM | POA: Insufficient documentation

## 2013-11-14 LAB — PRESCRIPTION ABUSE MONITORING 15P, URINE
AMPHETAMINE/METH: NEGATIVE ng/mL
Barbiturate Screen, Urine: NEGATIVE ng/mL
Benzodiazepine Screen, Urine: NEGATIVE ng/mL
Buprenorphine, Urine: NEGATIVE ng/mL
CANNABINOID SCRN UR: NEGATIVE ng/mL
CARISOPRODOL, URINE: NEGATIVE ng/mL
COCAINE METABOLITES: NEGATIVE ng/mL
CREATININE, URINE: 43.42 mg/dL (ref >20.0)
MEPERIDINE UR: NEGATIVE ng/mL
Methadone Screen, Urine: NEGATIVE ng/mL
Opiate Screen, Urine: NEGATIVE ng/mL
Oxycodone Screen, Ur: NEGATIVE ng/mL
Propoxyphene: NEGATIVE ng/mL
Tramadol Scrn, Ur: NEGATIVE ng/mL
ZOLPIDEM, URINE: NEGATIVE ng/mL

## 2013-11-14 LAB — CBG MONITORING, ED: Glucose-Capillary: 167 mg/dL — ABNORMAL HIGH (ref 70–99)

## 2013-11-14 MED ORDER — PERMETHRIN 5 % EX CREA: TOPICAL_CREAM | CUTANEOUS | Status: DC

## 2013-11-14 MED ORDER — HYDROXYZINE HCL 25 MG PO TABS
25.0000 mg | ORAL_TABLET | Freq: Once | ORAL | Status: AC
  Administered 2013-11-14: 25 mg via ORAL
  Filled 2013-11-14: qty 1

## 2013-11-14 MED ORDER — HYDROXYZINE HCL 25 MG PO TABS: 25.0000 mg | ORAL_TABLET | Freq: Three times a day (TID) | ORAL | Status: DC | PRN

## 2013-11-14 NOTE — Assessment & Plan Note (Signed)
BP Readings from Last 3 Encounters:  11/14/13 188/86  11/13/13 158/77  11/08/13 203/103   Today patient has best BP control in a long time and therefore doesn't seem likely to be an etiology to AMS. Will make no changes to medications.

## 2013-11-14 NOTE — Progress Notes (Signed)
CSW consult to pt regarding AMS. CSW met with pt who reported that she lives alone and has home health as well as a home health aide who comes in 5 days a week 2 hours per day. CSW asked if there should be any reason the medical staff should be concerned about her safety. Pt reported that she has difficulty taking her medication. Pt reported that someone poured out her medication and she just placed the medication back into random bottles. CSW spoke with pt.'s family who has raised concerns in regards to pt.'s safety and her inability to take medications. CSW made APS report. CSW informed pt.'s family and Internal Medicine staff.    [address] [place]

## 2013-11-14 NOTE — ED Notes (Signed)
Pt states she is here only for the bug bites she thinks she has to her arms.  Pt has scratch marks to her right arm, and states they have been itching.  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  Per EMS, pts initial BP on scene 278/120 with CBG 196

## 2013-11-14 NOTE — ED Notes (Signed)
Pt getting dressed on paper scrubs for transport home

## 2013-11-14 NOTE — ED Provider Notes (Signed)
TIME SEEN: 7:11 AM  CHIEF COMPLAINT: Rash  HPI: Patient is a 53 y.o. F with history of HIV, hypertension, diabetes, chronic kidney disease, CHF, stroke, seizures who presents the emergency department with complaints of what she believes are bites to her arms, legs, torso that started yesterday. She states they have been itching and that is the reason she called 911 today. She did not take any medication at home. Per EMS, patient had initial elevated blood pressure but she has had normal blood pressures in the emergency department without intervention. She denies that she's had any recent chest pain, shortness of breath, fevers, chills, cough, abdominal pain, vomiting or diarrhea, headache or neck pain, tick bite. Denies any new exposures. She states she lives alone.  ROS: See HPI Constitutional: no fever  Eyes: no drainage  ENT: no runny nose   Cardiovascular:  no chest pain  Resp: no SOB  GI: no vomiting GU: no dysuria Integumentary:  rash  Allergy: no hives  Musculoskeletal: no leg swelling  Neurological: no slurred speech ROS otherwise negative  PAST MEDICAL HISTORY/PAST SURGICAL HISTORY:  Past Medical History  Diagnosis Date  . Seizures   . Stroke   . Meningitis   . HIV (human immunodeficiency virus infection)   . Hypertension   . Gout   . Muscle spasms of head and/or neck   . CKD (chronic kidney disease)   . CHF (congestive heart failure)     [name] 06/18/2013  . HCV (hepatitis C virus)     chronic/notes 06/18/2013  . Type II diabetes mellitus     [name] 06/18/2013  . AIHA (autoimmune hemolytic anemia)     [name] 06/18/2013  . Hypertensive encephalopathy ~ 05/2013    hospitalaized/notes 06/18/2013  . Daily headache     "for the last 6 years/notes 06/18/2013  . Exertional shortness of breath     [name] 06/18/2013  . Anxiety     [name] 06/18/2013  . Nephrotic syndrome   . History of syphilis   . High cholesterol     MEDICATIONS:  Prior to Admission medications    Medication Sig Start Date End Date Taking? Authorizing Provider  abacavir (ZIAGEN) 300 MG tablet Take 300 mg by mouth 2 (two) times daily.   Yes Historical Provider, MD  acyclovir (ZOVIRAX) 800 MG tablet Take 800 mg by mouth daily.    Yes Historical Provider, MD  aspirin EC 81 MG tablet Take 1 tablet (81 mg total) by mouth daily. 06/02/13  Yes Vivi Barrack, MD  cloNIDine (CATAPRES) 0.3 MG tablet Take 1 tablet (0.3 mg total) by mouth 3 (three) times daily. 10/14/13 10/14/14 Yes Annett Gula, MD  Darunavir Ethanolate (PREZISTA) 800 MG tablet Take 1 tablet (800 mg total) by mouth daily with breakfast. 09/26/13  Yes Judyann Munson, MD  diphenhydrAMINE (BENADRYL) 25 MG tablet Take 25 mg by mouth every 6 (six) hours as needed for itching.   Yes Historical Provider, MD  furosemide (LASIX) 80 MG tablet Take 1 tablet (80 mg total) by mouth 2 (two) times daily. 10/14/13  Yes Annett Gula, MD  hydrALAZINE (APRESOLINE) 50 MG tablet Take 2 tablets (100 mg total) by mouth 3 (three) times daily. 10/14/13  Yes Annett Gula, MD  insulin aspart (NOVOLOG) 100 UNIT/ML injection Inject 10-25 Units into the skin 3 (three) times daily before meals. Based on sliding scale   Yes Historical Provider, MD  insulin glargine (LANTUS) 100 UNIT/ML injection Inject 0.3 mLs (30 Units total) into the skin  at bedtime. 10/14/13  Yes Annett Gula, MD  labetalol (NORMODYNE) 300 MG tablet Take 2 tablets (600 mg total) by mouth 2 (two) times daily. 10/14/13  Yes Annett Gula, MD  lamiVUDine (EPIVIR) 150 MG tablet Take 1 tablet (150 mg total) by mouth daily. 06/20/13  Yes Vivi Barrack, MD  levETIRAcetam (KEPPRA) 500 MG tablet Take 3 tablets (1,500 mg total) by mouth 2 (two) times daily. 10/14/13  Yes Annett Gula, MD  lisinopril (PRINIVIL,ZESTRIL) 40 MG tablet Take 1 tablet (40 mg total) by mouth daily. 09/04/13  Yes Rocco Serene, MD  Multiple Vitamin (MULTIVITAMIN WITH MINERALS) TABS tablet Take 1 tablet by mouth daily.   Yes Historical  Provider, MD  pantoprazole (PROTONIX) 40 MG tablet Take 1 tablet (40 mg total) by mouth daily. 06/02/13  Yes Vivi Barrack, MD  phenytoin (DILANTIN) 30 MG ER capsule Take 1 capsule (30 mg total) by mouth at bedtime. 10/14/13  Yes Annett Gula, MD  potassium chloride (K-DUR,KLOR-CON) 10 MEQ tablet Take 1 tablet (10 mEq total) by mouth daily. 10/14/13  Yes Annett Gula, MD  predniSONE (DELTASONE) 20 MG tablet Take 1.5 tablets (30 mg total) by mouth daily with breakfast. 10/14/13  Yes Annett Gula, MD  ritonavir (NORVIR) 100 MG TABS tablet Take 1 tablet (100 mg total) by mouth daily with breakfast. 09/26/13  Yes Judyann Munson, MD  triamcinolone cream (KENALOG) 0.1 % Apply 1 application topically 4 (four) times daily as needed (itching).    Yes Historical Provider, MD    ALLERGIES:  Allergies  Allergen Reactions  . 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 Norvasc [Amlodipine Besylate]     Itching, rash , hives .   [name] Morphine And Related Hives, Itching and Rash    SOCIAL HISTORY:  History  Substance Use Topics  . Smoking status: Former Smoker    Types: Cigarettes    Quit date: 06/19/2010  . Smokeless tobacco: Never Used  . Alcohol Use: No    FAMILY HISTORY: Family History  Problem Relation Age of Onset  . Cancer - Colon Mother   . Cancer Father   . Hypertension Father   . Diabetes    . Diabetes Sister     EXAM: BP 137/58  Pulse 75  Temp(Src) 97.7 F (36.5 C) (Oral)  Resp 17  SpO2 100% CONSTITUTIONAL: Alert and oriented and responds appropriately to questions. Well-appearing; well-nourished HEAD: Normocephalic EYES: Conjunctivae clear, PERRL ENT: normal nose; no rhinorrhea; moist mucous membranes; pharynx without lesions noted NECK: Supple, no meningismus, no LAD  CARD: RRR; S1 and S2 appreciated; no murmurs, no clicks, no rubs, no gallops RESP: Normal chest excursion without splinting or tachypnea; breath sounds clear and  equal bilaterally; no wheezes, no rhonchi, no rales,  ABD/GI: Normal bowel sounds; non-distended; soft, non-tender, no rebound, no guarding BACK:  The back appears normal and is non-tender to palpation, there is no CVA tenderness EXT: Normal ROM in all joints; non-tender to palpation; bilateral pitting edema to the knees; normal capillary refill; no cyanosis    SKIN: Normal color for age and race; warm; patient here with multiple papular lesions to her extremities and torso with evidence of excoriation with no superimposed infection, erythema or warmth or induration or fluctuance, there are some areas on her arms that look similar to burrowing there is no rash in between her digits, no rash on her palms or soles, no mucous membrane involvement;  no blisters or desquamation NEURO: Moves all extremities equally, no slurred speech or facial droop PSYCH: The patient's mood and manner are appropriate. Grooming and personal hygiene are appropriate.  MEDICAL DECISION MAKING: Patient here with rash that appears to be insect bites without any sign of superimposed infection.  Possible bedbugs versus scabies.  No history of tick bite or fever. No lesions on her palms, soles or mucous membranes. Patient is otherwise well-appearing, hemodynamically stable. She has no other current complaints. She states that the edema in her legs is much better than it normally is. She denies any current chest pain or shortness of breath. I am not concerned for any life-threatening illness. We'll discharge patient with prescription for Atarax and permethrin. Have discussed return precautions and importance for PCP followup. Discussed supportive care instructions. Have also discussed with patient I recommend that she stop scratching these areas patient does have multiple areas of excoriation in the risk of infection with scratching. Patient verbalizes understanding and is comfortable with this plan.       Layla MawKristen N Ward, DO 11/14/13  [phone]

## 2013-11-14 NOTE — Discharge Instructions (Signed)
Rash A rash is a change in the color or texture of your skin. There are many different types of rashes. You may have other problems that accompany your rash. CAUSES   Infections.  Allergic reactions. This can include allergies to pets or foods.  Certain medicines.  Exposure to certain chemicals, soaps, or cosmetics.  Heat.  Exposure to poisonous plants.  Tumors, both cancerous and noncancerous. SYMPTOMS   Redness.  Scaly skin.  Itchy skin.  Dry or cracked skin.  Bumps.  Blisters.  Pain. DIAGNOSIS  Your caregiver may do a physical exam to determine what type of rash you have. A skin sample (biopsy) may be taken and examined under a microscope. TREATMENT  Treatment depends on the type of rash you have. Your caregiver may prescribe certain medicines. For serious conditions, you may need to see a skin doctor (dermatologist). HOME CARE INSTRUCTIONS   Avoid the substance that caused your rash.  Do not scratch your rash. This can cause infection.  You may take cool baths to help stop itching.  Only take over-the-counter or prescription medicines as directed by your caregiver.  Keep all follow-up appointments as directed by your caregiver. SEEK IMMEDIATE MEDICAL CARE IF:  You have increasing pain, swelling, or redness.  You have a fever.  You have new or severe symptoms.  You have body aches, diarrhea, or vomiting.  Your rash is not better after 3 days. MAKE SURE YOU:  Understand these instructions.  Will watch your condition.  Will get help right away if you are not doing well or get worse. Document Released: 05/06/2002 Document Revised: 08/08/2011 Document Reviewed: 02/28/2011 [facility] Patient Information 2015 [place], [place]. This information is not intended to replace advice given to you by your health care provider. Make sure you discuss any questions you have with your health care provider.   Possible Scabies Scabies are small bugs (mites) that  burrow under the skin and cause red bumps and severe itching. These bugs can only be seen with a microscope. Scabies are highly contagious. They can spread easily from person to person by direct contact. They are also spread through sharing clothing or linens that have the scabies mites living in them. It is not unusual for an entire family to become infected through shared towels, clothing, or bedding.  HOME CARE INSTRUCTIONS   Your caregiver may prescribe a cream or lotion to kill the mites. If cream is prescribed, massage the cream into the entire body from the neck to the bottom of both feet. Also massage the cream into the scalp and face if your child is less than 55 year old. Avoid the eyes and mouth. Do not wash your hands after application.  Leave the cream on for 8 to 12 hours. Your child should bathe or shower after the 8 to 12 hour application period. Sometimes it is helpful to apply the cream to your child right before bedtime.  One treatment is usually effective and will eliminate approximately 95% of infestations. For severe cases, your caregiver may decide to repeat the treatment in 1 week. Everyone in your household should be treated with one application of the cream.  New rashes or burrows should not appear within 24 to 48 hours after successful treatment. However, the itching and rash may last for 2 to 4 weeks after successful treatment. Your caregiver may prescribe a medicine to help with the itching or to help the rash go away more quickly.  Scabies can live on clothing or linens  for up to 3 days. All of your child's recently used clothing, towels, stuffed toys, and bed linens should be washed in hot water and then dried in a dryer for at least 20 minutes on high heat. Items that cannot be washed should be enclosed in a plastic bag for at least 3 days.  To help relieve itching, bathe your child in a cool bath or apply cool washcloths to the affected areas.  Your child may return to  school after treatment with the prescribed cream. SEEK MEDICAL CARE IF:   The itching persists longer than 4 weeks after treatment.  The rash spreads or becomes infected. Signs of infection include red blisters or yellow-tan crust. Document Released: 05/16/2005 Document Revised: 08/08/2011 Document Reviewed: 09/24/2008 [facility] Patient Information 2015 [place], [place]. This information is not intended to replace advice given to you by your health care provider. Make sure you discuss any questions you have with your health care provider.   Possible Bedbugs Bedbugs are tiny bugs that live in and around beds. During the day, they hide in mattresses and other places near beds. They come out at night and bite people lying in bed. They need blood to live and grow. Bedbugs can be found in beds anywhere. Usually, they are found in places where many people come and go (hotels, shelters, hospitals). It does not matter whether the place is dirty or clean. Getting bitten by bedbugs rarely causes a medical problem. The biggest problem can be getting rid of them. This often takes the work of a Oncologist. CAUSES  Less use of pesticides. Bedbugs were common before the 1950s. Then, strong pesticides such as DDT nearly wiped them out. Today, these pesticides are not used because they harm the environment and can cause health problems.  More travel. Besides mattresses, bedbugs can also live in clothing and luggage. They can come along as people travel from place to place. Bedbugs are more common in certain parts of the world. When people travel to those areas, the bugs can come home with them.  Presence of birds and bats. Bedbugs often infest birds and bats. If you have these animals in or near your home, bedbugs may infest your house, too. SYMPTOMS It does not hurt to be bitten by a bedbug. You will probably not wake up when you are bitten. Bedbugs usually bite areas of the skin that are not covered.  Symptoms may show when you wake up, or they may take a day or more to show up. Symptoms may include:  Small red bumps on the skin. These might be lined up in a row or clustered in a group.  A darker red dot in the middle of red bumps.  Blisters on the skin. There may be swelling and very bad itching. These may be signs of an allergic reaction. This does not happen often. DIAGNOSIS Bedbug bites might look and feel like other types of insect bites. The bugs do not stay on the body like ticks or lice. They bite, drop off, and crawl away to hide. Your caregiver will probably:  Ask about your symptoms.  Ask about your recent activities and travel.  Check your skin for bedbug bites.  Ask you to check at home for signs of bedbugs. You should look for:  Spots or stains on the bed or nearby. This could be from bedbugs that were crushed or from their eggs or waste.  Bedbugs themselves. They are reddish-brown, oval, and flat. They do  not fly. They are about the size of an apple seed.  Places to look for bedbugs include:  Beds. Check mattresses, headboards, box springs, and bed frames.  On drapes and curtains near the bed.  Under carpeting in the bedroom.  Behind electrical outlets.  Behind any wallpaper that is peeling.  Inside luggage. TREATMENT Most bedbug bites do not need treatment. They usually go away on their own in a few days. The bites are not dangerous. However, treatment may be needed if you have scratched so much that your skin has become infected. You may also need treatment if you are allergic to bedbug bites. Treatment options include:  A drug that stops swelling and itching (corticosteroid). Usually, a cream is rubbed on the skin. If you have a bad rash, you may be given a corticosteroid pill.  Oral antihistamines. These are pills to help control itching.  Antibiotic medicines. An antibiotic may be prescribed for infected skin. HOME CARE INSTRUCTIONS   Take any  medicine prescribed by your caregiver for your bites. Follow the directions carefully.  Consider wearing pajamas with long sleeves and pant legs.  Your bedroom may need to be treated. A pest control expert should make sure the bedbugs are gone. You may need to throw away mattresses or luggage. Ask the pest control expert what you can do to keep the bedbugs from coming back. Common suggestions include:  Putting a plastic cover over your mattress.  Washing and drying your clothes and bedding in hot water and a hot dryer. The temperature should be hotter than 120 F (48.9 C). Bedbugs are killed by high temperatures.  Vacuuming carefully all around your bed. Vacuum in all cracks and crevices where the bugs might hide. Do this often.  Carefully checking all used furniture, bedding, or clothes that you bring into your house.  Eliminating bird nests and bat roosts.  If you get bedbug bites when traveling, check all your possessions carefully before bringing them into your house. If you find any bugs on clothes or in your luggage, consider throwing those items away. SEEK MEDICAL CARE IF:  You have red bug bites that keep coming back.  You have red bug bites that itch badly.  You have bug bites that cause a skin rash.  You have scratch marks that are red and sore. SEEK IMMEDIATE MEDICAL CARE IF: You have a fever. Document Released: 06/18/2010 Document Revised: 08/08/2011 Document Reviewed: 06/18/2010 [facility] Patient Information 2015 [place], [place]. This information is not intended to replace advice given to you by your health care provider. Make sure you discuss any questions you have with your health care provider.

## 2013-11-14 NOTE — Progress Notes (Signed)
Attending physician note: This patient's situation was discussed at length with resident physician Dr. Christen Bame on the day of the patient's visit. This is an unfortunate woman with multiple advanced medical problems including poorly controlled hypertension, hepatitis C, and HIV. She has a history of seizures. Previous stroke. She has very significant mental health issues. At time of today's visit she continues to exhibit signs of extreme paranoia. She has thrown out her medications. She is currently living alone. Although family members look in on her, they cannot be present around the clock to supervise her medications. Independent visits by home health RNs and case managers have a documented the significant fluctuations in her mental status and agree that for her own safety and medical stability, she is not capable of independent living. Previous attempts at placement in an extended care facility have not been successful due to the resistance of some family members. Her niece and most consistent caregiver who is here today he is in agreement that her aunt needs a more protected and supervised environment. We are going to involve adult protective services at this time so that she gets the psychiatric care that she needs in the environment that is most appropriate for her.  Michelle Darby, MD, FACP  Hematology-Oncology/Internal Medicine

## 2013-11-14 NOTE — ED Notes (Signed)
Pt states that she was watching TV and saw that there "were bugs out there," then she noticed that her skin looked like "a frog." pt states that the areas on her skin are from her scratching but she was not actually bite by an insect.

## 2013-11-14 NOTE — ED Notes (Signed)
MD at bedside. 

## 2013-11-14 NOTE — Assessment & Plan Note (Addendum)
Pt is having continued fluctuating periods of lucidity and then AMS this has not only been seen by this provider but several of the other providers that have been sent out to the home as referenced in the progress note. It has become an increasing concern that during these episodes of AMS the patient is throwing out meds and is not safe given her significant medical comorbidities. The patient can't access higher level of care such as 24 hr surveillance and management of medications despite several efforts of trying to place the patient into a SNF where later the patient's sister refused her admission, PCS and P4 cc have been contacted and going out to the home but that still does not appear to be sufficient to help the patient manage her significant medical comorbidities. The patient's altered mental status seems to be escalating and even weekly visits with this provider has not been able to help maintain the patient at a safe baseline. The patient has even been sent to behavioral health for immediate evaluation where she was subsequently discharged home. It is the believe that the patient is not safe at home and has had episodes of hypoglycemia into the 20s because the patient wanted to "cheat the test" to uncontrolled severe HTN 230/110s as reported by [facility] and then normalizing during an office visit these large swings in both glycemic and BP control can be detrimental to the patient (pt has had several HTN emergencies needing admission as a result). Given all these concerns and collateral information it was decided that APS be contacted. 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 CBG (last 3)   Recent Labs  11/13/13 1410 11/14/13 0523  GLUCAP 152* 167*   -greater than 40 min was had with >50% in counseling and gathering information for coordination of care and patient safety

## 2013-11-16 LAB — FENTANYL (GC/LC/MS), URINE
Fentanyl, confirm: NEGATIVE ng/mL (ref <0.5)
Norfentanyl, confirm: NEGATIVE ng/mL (ref <0.5)

## 2013-11-18 ENCOUNTER — Telehealth: Payer: Self-pay | Admitting: *Deleted

## 2013-11-18 ENCOUNTER — Other Ambulatory Visit: Payer: Self-pay | Admitting: Internal Medicine

## 2013-11-18 NOTE — Telephone Encounter (Signed)
CSW placed called to pt.  CSW left message requesting return call. CSW provided contact hours and phone number.  Attempting to inquire if pt is current with [facility] and PCS agencies.

## 2013-11-18 NOTE — Telephone Encounter (Signed)
Referral for [facility] RN for pill box management and MSW completed with [facility].  HHA not available, M/A and pt is current with [facility].

## 2013-11-19 ENCOUNTER — Telehealth: Payer: Self-pay | Admitting: Internal Medicine

## 2013-11-19 ENCOUNTER — Encounter: Payer: Self-pay | Admitting: Licensed Clinical Social Worker

## 2013-11-19 ENCOUNTER — Encounter (HOSPITAL_COMMUNITY): Payer: Self-pay | Admitting: Emergency Medicine

## 2013-11-19 ENCOUNTER — Emergency Department (HOSPITAL_COMMUNITY)
Admission: EM | Admit: 2013-11-19 | Discharge: 2013-11-20 | Disposition: A | Discharge status: Home / Self Care | Payer: Medicaid Other | Attending: Emergency Medicine | Admitting: Emergency Medicine

## 2013-11-19 DIAGNOSIS — Z21 Asymptomatic human immunodeficiency virus [HIV] infection status: Secondary | ICD-10-CM | POA: Diagnosis not present

## 2013-11-19 DIAGNOSIS — IMO0002 Reserved for concepts with insufficient information to code with codable children: Secondary | ICD-10-CM | POA: Diagnosis not present

## 2013-11-19 DIAGNOSIS — R443 Hallucinations, unspecified: Secondary | ICD-10-CM | POA: Insufficient documentation

## 2013-11-19 DIAGNOSIS — N189 Chronic kidney disease, unspecified: Secondary | ICD-10-CM | POA: Diagnosis not present

## 2013-11-19 DIAGNOSIS — G40909 Epilepsy, unspecified, not intractable, without status epilepticus: Secondary | ICD-10-CM | POA: Insufficient documentation

## 2013-11-19 DIAGNOSIS — E119 Type 2 diabetes mellitus without complications: Secondary | ICD-10-CM | POA: Insufficient documentation

## 2013-11-19 DIAGNOSIS — Z87891 Personal history of nicotine dependence: Secondary | ICD-10-CM | POA: Diagnosis not present

## 2013-11-19 DIAGNOSIS — Z8673 Personal history of transient ischemic attack (TIA), and cerebral infarction without residual deficits: Secondary | ICD-10-CM | POA: Diagnosis not present

## 2013-11-19 DIAGNOSIS — Z008 Encounter for other general examination: Secondary | ICD-10-CM | POA: Diagnosis present

## 2013-11-19 DIAGNOSIS — I129 Hypertensive chronic kidney disease with stage 1 through stage 4 chronic kidney disease, or unspecified chronic kidney disease: Secondary | ICD-10-CM | POA: Insufficient documentation

## 2013-11-19 DIAGNOSIS — E7800 Pure hypercholesterolemia, unspecified: Secondary | ICD-10-CM | POA: Diagnosis not present

## 2013-11-19 DIAGNOSIS — F29 Unspecified psychosis not due to a substance or known physiological condition: Secondary | ICD-10-CM | POA: Diagnosis not present

## 2013-11-19 DIAGNOSIS — Z79899 Other long term (current) drug therapy: Secondary | ICD-10-CM | POA: Insufficient documentation

## 2013-11-19 DIAGNOSIS — I509 Heart failure, unspecified: Secondary | ICD-10-CM | POA: Diagnosis not present

## 2013-11-19 DIAGNOSIS — Z794 Long term (current) use of insulin: Secondary | ICD-10-CM | POA: Diagnosis not present

## 2013-11-19 DIAGNOSIS — F411 Generalized anxiety disorder: Secondary | ICD-10-CM | POA: Diagnosis not present

## 2013-11-19 DIAGNOSIS — Z7982 Long term (current) use of aspirin: Secondary | ICD-10-CM | POA: Insufficient documentation

## 2013-11-19 DIAGNOSIS — Z8619 Personal history of other infectious and parasitic diseases: Secondary | ICD-10-CM | POA: Diagnosis not present

## 2013-11-19 DIAGNOSIS — Z862 Personal history of diseases of the blood and blood-forming organs and certain disorders involving the immune mechanism: Secondary | ICD-10-CM | POA: Diagnosis not present

## 2013-11-19 DIAGNOSIS — R41840 Attention and concentration deficit: Secondary | ICD-10-CM | POA: Insufficient documentation

## 2013-11-19 LAB — COMPREHENSIVE METABOLIC PANEL
ALK PHOS: 471 U/L — AB (ref 39–117)
ALT: 99 U/L — AB (ref 0–35)
AST: 129 U/L — AB (ref 0–37)
Albumin: 2 g/dL — ABNORMAL LOW (ref 3.5–5.2)
BILIRUBIN TOTAL: 1.1 mg/dL (ref 0.3–1.2)
BUN: 48 mg/dL — ABNORMAL HIGH (ref 6–23)
CALCIUM: 7.8 mg/dL — AB (ref 8.4–10.5)
CHLORIDE: 112 meq/L (ref 96–112)
CO2: 14 meq/L — AB (ref 19–32)
Creatinine, Ser: 2.01 mg/dL — ABNORMAL HIGH (ref 0.50–1.10)
GFR, EST AFRICAN AMERICAN: 31 mL/min — AB (ref >90)
GFR, EST NON AFRICAN AMERICAN: 27 mL/min — AB (ref >90)
Glucose, Bld: 235 mg/dL — ABNORMAL HIGH (ref 70–99)
Potassium: 4.4 mEq/L (ref 3.7–5.3)
SODIUM: 142 meq/L (ref 137–147)
Total Protein: 5.7 g/dL — ABNORMAL LOW (ref 6.0–8.3)

## 2013-11-19 LAB — CBC
HCT: 23.8 % — ABNORMAL LOW (ref 36.0–46.0)
Hemoglobin: 8.1 g/dL — ABNORMAL LOW (ref 12.0–15.0)
MCH: 36.7 pg — ABNORMAL HIGH (ref 26.0–34.0)
MCHC: 34 g/dL (ref 30.0–36.0)
MCV: 107.7 fL — AB (ref 78.0–100.0)
PLATELETS: 151 10*3/uL (ref 150–400)
RBC: 2.21 MIL/uL — AB (ref 3.87–5.11)
RDW: 13.1 % (ref 11.5–15.5)
WBC: 3.7 10*3/uL — AB (ref 4.0–10.5)

## 2013-11-19 LAB — SALICYLATE LEVEL: Salicylate Lvl: <2 mg/dL — ABNORMAL LOW (ref 2.8–20.0)

## 2013-11-19 LAB — RAPID URINE DRUG SCREEN, HOSP PERFORMED
AMPHETAMINES: NOT DETECTED
BARBITURATES: NOT DETECTED
Benzodiazepines: NOT DETECTED
Cocaine: NOT DETECTED
Opiates: NOT DETECTED
TETRAHYDROCANNABINOL: NOT DETECTED

## 2013-11-19 LAB — ACETAMINOPHEN LEVEL: Acetaminophen (Tylenol), Serum: <15 ug/mL (ref 10–30)

## 2013-11-19 LAB — ETHANOL: Alcohol, Ethyl (B): <11 mg/dL (ref 0–11)

## 2013-11-19 NOTE — Progress Notes (Signed)
Patient ID: Michelle [name], female   DOB: 11/13/1960, 53 y.o.   MRN: [id number] CMIS message from [facility] care manager, Michelle [name]. 11/18/2013: I talked with [name] this morning. She informed me, that a representative from Adult Protective Services went out to see pt. in her home on 11/15/13 (in response to being contacted per pt's PCP). She related, they determined that pt. is not a candidate for their services. They recommended, that she be evaluated per Mental Health. Also informed her, that pt. is visiting with her sister today, & that she has disassembled her home phone.  Michelle [name]

## 2013-11-19 NOTE — ED Notes (Signed)
Patient changed into blue paper scrubs.

## 2013-11-19 NOTE — ED Notes (Signed)
Pt belongings include:  Bag of creams/medications, will be given to RN  Black purse that contains: Wallet with numerous cards in it Metal wallet with cards Chilton Si wallet with ID, credit card, social security card Lotions, tissues, eye glass cleaner Snacks  Toiletry items Black [employer] cell phone  Pt belongings bag with jeans, Jesus hat, yellow t shirt, powerade, creams, baby powder   Pt belongings checked by security and entered by [place], EMT.

## 2013-11-19 NOTE — ED Notes (Signed)
Family at bedside. 

## 2013-11-19 NOTE — ED Notes (Signed)
Pt belongings are currently at [employer] across from room 10.

## 2013-11-19 NOTE — Telephone Encounter (Signed)
  Reason for call:   I placed an outgoing call to Misty Stanley [phone]) sister of Michelle [name] 10  AM regarding her request to speak to her PCP about the situation of her sister. She was informed that the patient didn't want any information shared with her sister's medical condition and has made that clear several times to this provider, therefore this provider is unable to give any information unless granted by the patient.   Assessment/ Plan:   Pt didn't authorize information to be given to sister therefore this was shared with the sister. The sister didn't answer and therefore a message detailing the above was left.   Christen Bame, MD   11/19/2013, 10:10 AM

## 2013-11-19 NOTE — Telephone Encounter (Signed)
Call from patient's cousin [name].   [name] said that someone from [facility] came and stated  that pt needed to see Mental Health.  Patient has been yelling over the past few days.  Has been "getting in family members faces".  Has physically jerked on patients collars.  Has been verbally abusive to her daughter this week end.  [name] wanted to know what she needed to do.  Dr. Burtis Junes was consulted pt is to be taken to the ER at [facility] for assessment for admission to [facility].  [name] agreed and will get patient to [facility] ER today for assessment and admission if needed.  To call Clinics if futher assistance is needed.  Angelina Ok, RN 11/18/2012 10:35 AM

## 2013-11-19 NOTE — ED Notes (Signed)
Per pt and family report: pt has confusion and headache.  Family reports that pt is confused on the day of the weeks and thinks family takes medications and stuffing cocaine in her stuff medications, filming pt. Pt does endorse hearing voices. Pt a/o x 4. Pt denies SI/HI. Family states pt is having A/V hallucinations.

## 2013-11-19 NOTE — ED Notes (Signed)
Security en route to wand pt.

## 2013-11-20 DIAGNOSIS — F29 Unspecified psychosis not due to a substance or known physiological condition: Secondary | ICD-10-CM

## 2013-11-20 LAB — URINALYSIS, ROUTINE W REFLEX MICROSCOPIC
Bilirubin Urine: NEGATIVE
Glucose, UA: 100 mg/dL — AB
Hgb urine dipstick: NEGATIVE
KETONES UR: NEGATIVE mg/dL
LEUKOCYTES UA: NEGATIVE
NITRITE: NEGATIVE
Specific Gravity, Urine: 1.014 (ref 1.005–1.030)
Urobilinogen, UA: 1 mg/dL (ref 0.0–1.0)
pH: 6 (ref 5.0–8.0)

## 2013-11-20 LAB — URINE MICROSCOPIC-ADD ON

## 2013-11-20 LAB — CBG MONITORING, ED
Glucose-Capillary: 139 mg/dL — ABNORMAL HIGH (ref 70–99)
Glucose-Capillary: 93 mg/dL (ref 70–99)
Glucose-Capillary: 275 mg/dL — ABNORMAL HIGH (ref 70–99)

## 2013-11-20 LAB — AMMONIA: AMMONIA: 105 umol/L — AB (ref 11–60)

## 2013-11-20 MED ORDER — CLONIDINE HCL 0.1 MG PO TABS
0.3000 mg | ORAL_TABLET | Freq: Three times a day (TID) | ORAL | Status: DC
  Administered 2013-11-20: 0.3 mg via ORAL
  Filled 2013-11-20: qty 3

## 2013-11-20 MED ORDER — ACYCLOVIR 800 MG PO TABS
800.0000 mg | ORAL_TABLET | Freq: Every day | ORAL | Status: DC
  Administered 2013-11-20: 800 mg via ORAL
  Filled 2013-11-20: qty 1

## 2013-11-20 MED ORDER — DARUNAVIR ETHANOLATE 800 MG PO TABS
800.0000 mg | ORAL_TABLET | Freq: Every day | ORAL | Status: DC
  Administered 2013-11-20: 800 mg via ORAL
  Filled 2013-11-20 (×2): qty 1

## 2013-11-20 MED ORDER — PANTOPRAZOLE SODIUM 40 MG PO TBEC
40.0000 mg | DELAYED_RELEASE_TABLET | Freq: Every day | ORAL | Status: DC
  Administered 2013-11-20: 40 mg via ORAL
  Filled 2013-11-20: qty 1

## 2013-11-20 MED ORDER — LEVETIRACETAM 750 MG PO TABS
1500.0000 mg | ORAL_TABLET | Freq: Two times a day (BID) | ORAL | Status: DC
  Administered 2013-11-20 (×2): 1500 mg via ORAL
  Filled 2013-11-20 (×3): qty 2

## 2013-11-20 MED ORDER — INSULIN GLARGINE 100 UNIT/ML ~~LOC~~ SOLN
30.0000 [IU] | Freq: Every day | SUBCUTANEOUS | Status: DC
Start: 2013-11-20 — End: 2013-11-20
  Administered 2013-11-20: 30 [IU] via SUBCUTANEOUS
  Filled 2013-11-20: qty 0.3

## 2013-11-20 MED ORDER — INSULIN ASPART 100 UNIT/ML ~~LOC~~ SOLN
0.0000 [IU] | Freq: Three times a day (TID) | SUBCUTANEOUS | Status: DC
  Administered 2013-11-20: 5 [IU] via SUBCUTANEOUS
  Filled 2013-11-20: qty 1

## 2013-11-20 MED ORDER — POTASSIUM CHLORIDE CRYS ER 20 MEQ PO TBCR
10.0000 meq | EXTENDED_RELEASE_TABLET | Freq: Every day | ORAL | Status: DC
  Administered 2013-11-20: 10 meq via ORAL

## 2013-11-20 MED ORDER — HYDROXYZINE HCL 10 MG PO TABS
10.0000 mg | ORAL_TABLET | Freq: Three times a day (TID) | ORAL | Status: DC | PRN
  Administered 2013-11-20: 10 mg via ORAL
  Filled 2013-11-20: qty 1

## 2013-11-20 MED ORDER — HYDRALAZINE HCL 50 MG PO TABS
100.0000 mg | ORAL_TABLET | Freq: Three times a day (TID) | ORAL | Status: DC
  Administered 2013-11-20 (×2): 100 mg via ORAL
  Filled 2013-11-20 (×4): qty 2

## 2013-11-20 MED ORDER — PHENYTOIN SODIUM EXTENDED 100 MG PO CAPS
300.0000 mg | ORAL_CAPSULE | ORAL | Status: DC
  Administered 2013-11-20: 300 mg via ORAL
  Filled 2013-11-20: qty 3

## 2013-11-20 MED ORDER — RITONAVIR 100 MG PO TABS
100.0000 mg | ORAL_TABLET | Freq: Every day | ORAL | Status: DC
  Administered 2013-11-20: 100 mg via ORAL
  Filled 2013-11-20 (×2): qty 1

## 2013-11-20 MED ORDER — ABACAVIR SULFATE 300 MG PO TABS
300.0000 mg | ORAL_TABLET | Freq: Two times a day (BID) | ORAL | Status: DC
  Administered 2013-11-20: 300 mg via ORAL
  Filled 2013-11-20 (×2): qty 1

## 2013-11-20 MED ORDER — INSULIN ASPART 100 UNIT/ML ~~LOC~~ SOLN
10.0000 [IU] | Freq: Three times a day (TID) | SUBCUTANEOUS | Status: DC
  Filled 2013-11-20: qty 1

## 2013-11-20 MED ORDER — ASPIRIN EC 81 MG PO TBEC
81.0000 mg | DELAYED_RELEASE_TABLET | Freq: Every day | ORAL | Status: DC
  Administered 2013-11-20: 81 mg via ORAL
  Filled 2013-11-20: qty 1

## 2013-11-20 MED ORDER — FUROSEMIDE 40 MG PO TABS
80.0000 mg | ORAL_TABLET | Freq: Two times a day (BID) | ORAL | Status: DC
  Administered 2013-11-20: 80 mg via ORAL
  Filled 2013-11-20: qty 2

## 2013-11-20 MED ORDER — IBUPROFEN 200 MG PO TABS: 600.0000 mg | ORAL_TABLET | Freq: Three times a day (TID) | ORAL | Status: DC | PRN

## 2013-11-20 MED ORDER — LISINOPRIL 20 MG PO TABS
40.0000 mg | ORAL_TABLET | Freq: Every day | ORAL | Status: DC
  Administered 2013-11-20: 40 mg via ORAL
  Filled 2013-11-20: qty 2

## 2013-11-20 MED ORDER — ADULT MULTIVITAMIN W/MINERALS CH
1.0000 | ORAL_TABLET | Freq: Every day | ORAL | Status: DC
  Administered 2013-11-20: 1 via ORAL
  Filled 2013-11-20: qty 1

## 2013-11-20 MED ORDER — LAMIVUDINE 150 MG PO TABS
150.0000 mg | ORAL_TABLET | Freq: Every day | ORAL | Status: DC
  Administered 2013-11-20: 150 mg via ORAL
  Filled 2013-11-20: qty 1

## 2013-11-20 MED ORDER — ONDANSETRON HCL 4 MG PO TABS
4.0000 mg | ORAL_TABLET | Freq: Three times a day (TID) | ORAL | Status: DC | PRN
Start: 2013-11-20 — End: 2013-11-20

## 2013-11-20 MED ORDER — LABETALOL HCL 200 MG PO TABS
400.0000 mg | ORAL_TABLET | Freq: Two times a day (BID) | ORAL | Status: DC
  Administered 2013-11-20 (×2): 400 mg via ORAL
  Filled 2013-11-20 (×3): qty 2

## 2013-11-20 NOTE — ED Provider Notes (Signed)
Medical screening examination/treatment/procedure(s) were performed by non-physician practitioner and as supervising physician I was immediately available for consultation/collaboration.   EKG Interpretation None       Olivia Mackie, MD 11/20/13 [id number]

## 2013-11-20 NOTE — Progress Notes (Addendum)
CSW met with patient to obtain consent to talk to family. Patient was agreeable to speaking with CSW. Patient was verbally responsive to some questions, but unresponsive to others. Patient appeared to fall asleep several times during conversation. Patient provided consent to talk to her sisters Jules Schick [phone] and Illene Silver [phone].   CSW contacted sisters Shauna Hugh and Lattie Haw. From collateral information gathered, sisters have noticed a change in behavior, mood, and memory for approximately 6 months. Sisters report that patient is taking several medications and that her confusion level/mood vary on a daily basis. Patient lives alone and has been receiving assistance from her cousin [name] [phone] several hours a day. Sisters report that patient occasionally exhibits paranoid thinking in that she believes that cameras are watching her or that people are hiding drugs in her house. Patient and sister Lattie Haw (currently resides in [place]) are planning on getting an apartment together in the near future. Lattie Haw appears to be a strong support for patient. Lattie Haw has been in contact with Ernest Pine from [facility]PS regarding case management/housing needs. Sister Lattie Haw does not believe that patient is a danger to herself or others, but believes that patient can function with family support and supervision in place. Sister Lattie Haw is planning to travel to [facility] 11/21/13 and states that she, along with other family members, will be able to provide support and supervision to patient at home. Sister Lattie Haw provided CSW with patient's daughters contact information as well: [employer] [phone].  CSW updated MD. Plan is likely for patient to discharge home with support and supervision from family. CSW consulted CM to assist with verifying home health care. Patient states that she is getting ride home from friend Montine Circle [phone]. Sister Lattie Haw updated.  Tilden Fossa, MSW, [facility] Clinical  Social Worker [employer] [phone]

## 2013-11-20 NOTE — ED Notes (Signed)
Patient's medications counted and verified with patient's family member at the bedside. Medications walked down to pharmacy by RN.

## 2013-11-20 NOTE — ED Notes (Signed)
Pt went to the bathroom and did not collect a urine specimen.

## 2013-11-20 NOTE — ED Provider Notes (Signed)
CSN: [id number]     Arrival date & time 11/19/13  2053 History   First MD Initiated Contact with Patient 11/19/13 2223     Chief Complaint  Patient presents with  . Hallucinations  . Medical Clearance     (Consider location/radiation/quality/duration/timing/severity/associated sxs/prior Treatment) HPI Comments: Patient presents to the ED today with her cousin for worsening confusion, paranoia, and hearing voices.  Patient's cousin reports that over the past week the patient has become increasingly confused, paranoid, and aggressive.  She states that the patient has accused her of stealing, will not let her help fix her medicine pill box, has accused her of stuffing cocaine in her stuffed animals mouths, and has also tried to push and shove her multiple times.  She also reports that she has been having verbal arguments with her daughter. The patient has told me that she has been hearing voices which are calling her name but are not giving her any explicit instructions at this time.  She reports no suicidal or homicidal ideations at this time.  She does have a 30 year history of being HIV positive but wasn't able to tell me her CD4 count or her viral load at this time. She is treated with Antiviral therapy and seen by Infectious disease.  She has not had any recent changes in her medications at this time.  She has a remote history of substance abuse with cocaine and alcohol but her cousin reports that she has not used any substances to her knowledge in quite some time.  Wonda Olds is concerned that she is having some dementia at this time.  Daughter appears to be power of attorney per cousins report.   The history is provided by the patient. No language interpreter was used.    Past Medical History  Diagnosis Date  . Seizures   . Stroke   . Meningitis   . HIV (human immunodeficiency virus infection)   . Hypertension   . Gout   . Muscle spasms of head and/or neck   . CKD (chronic kidney disease)    . CHF (congestive heart failure)     [name] 06/18/2013  . HCV (hepatitis C virus)     chronic/notes 06/18/2013  . Type II diabetes mellitus     [name] 06/18/2013  . AIHA (autoimmune hemolytic anemia)     [name] 06/18/2013  . Hypertensive encephalopathy ~ 05/2013    hospitalaized/notes 06/18/2013  . Daily headache     "for the last 6 years/notes 06/18/2013  . Exertional shortness of breath     [name] 06/18/2013  . Anxiety     [name] 06/18/2013  . Nephrotic syndrome   . History of syphilis   . High cholesterol    Past Surgical History  Procedure Laterality Date  . Hip pinning Right   . Av fistula placement Right 07/24/2013    Procedure: RIGHT arm exploration of antecubital space;  Surgeon: Sherren Kerns, MD;  Location: [facility] OR;  Service: Vascular;  Laterality: Right;   Family History  Problem Relation Age of Onset  . Cancer - Colon Mother   . Cancer Father   . Hypertension Father   . Diabetes    . Diabetes Sister    History  Substance Use Topics  . Smoking status: Former Smoker    Types: Cigarettes    Quit date: 06/19/2010  . Smokeless tobacco: Never Used  . Alcohol Use: No   OB History   Grav Para Term Preterm Abortions TAB SAB Ect  Mult Living                 Review of Systems  Constitutional: Negative for fever, chills and fatigue.  Respiratory: Negative for chest tightness and shortness of breath.   Cardiovascular: Negative for chest pain and palpitations.  Gastrointestinal: Negative for nausea, vomiting, abdominal pain, diarrhea and constipation.  Genitourinary: Negative for dysuria, urgency, frequency, hematuria and difficulty urinating.  Neurological: Negative for headaches.  Psychiatric/Behavioral: Positive for hallucinations, confusion, decreased concentration and agitation. Negative for suicidal ideas and self-injury.  All other systems reviewed and are negative.     Allergies  Ceftriaxone; Norvasc; and Morphine and related  Home Medications   Prior to  Admission medications   Medication Sig Start Date End Date Taking? Authorizing Provider  abacavir (ZIAGEN) 300 MG tablet Take 300 mg by mouth 2 (two) times daily.   Yes Historical Provider, MD  acyclovir (ZOVIRAX) 800 MG tablet Take 800 mg by mouth daily.    Yes Historical Provider, MD  aspirin EC 81 MG tablet Take 1 tablet (81 mg total) by mouth daily. 06/02/13  Yes Vivi Barrack, MD  cloNIDine (CATAPRES) 0.3 MG tablet Take 1 tablet (0.3 mg total) by mouth 3 (three) times daily. 10/14/13 10/14/14 Yes Annett Gula, MD  Darunavir Ethanolate (PREZISTA) 800 MG tablet Take 1 tablet (800 mg total) by mouth daily with breakfast. 09/26/13  Yes Judyann Munson, MD  furosemide (LASIX) 80 MG tablet Take 1 tablet (80 mg total) by mouth 2 (two) times daily. 10/14/13  Yes Annett Gula, MD  hydrALAZINE (APRESOLINE) 50 MG tablet Take 2 tablets (100 mg total) by mouth 3 (three) times daily. 10/14/13  Yes Annett Gula, MD  hydrOXYzine (ATARAX/VISTARIL) 10 MG tablet Take 10 mg by mouth 3 (three) times daily as needed for itching.   Yes Historical Provider, MD  insulin aspart (NOVOLOG) 100 UNIT/ML injection Inject 10-25 Units into the skin 3 (three) times daily before meals. Based on sliding scale   Yes Historical Provider, MD  insulin glargine (LANTUS) 100 UNIT/ML injection Inject 0.3 mLs (30 Units total) into the skin at bedtime. 10/14/13  Yes Annett Gula, MD  labetalol (NORMODYNE) 200 MG tablet Take 400 mg by mouth 2 (two) times daily.   Yes Historical Provider, MD  levETIRAcetam (KEPPRA) 500 MG tablet Take 3 tablets (1,500 mg total) by mouth 2 (two) times daily. 10/14/13  Yes Annett Gula, MD  lisinopril (PRINIVIL,ZESTRIL) 40 MG tablet Take 1 tablet (40 mg total) by mouth daily. 09/04/13  Yes Rocco Serene, MD  Multiple Vitamin (MULTIVITAMIN WITH MINERALS) TABS tablet Take 1 tablet by mouth daily.   Yes Historical Provider, MD  pantoprazole (PROTONIX) 40 MG tablet Take 1 tablet (40 mg total) by mouth daily. 06/02/13   Yes Vivi Barrack, MD  permethrin (ELIMITE) 5 % cream Apply to affected area once 11/14/13  Yes Kristen N Ward, DO  phenytoin (DILANTIN) 100 MG ER capsule Take 300 mg by mouth every morning.   Yes Historical Provider, MD  potassium chloride (K-DUR,KLOR-CON) 10 MEQ tablet Take 1 tablet (10 mEq total) by mouth daily. 10/14/13  Yes Annett Gula, MD  predniSONE (DELTASONE) 20 MG tablet Take 1.5 tablets (30 mg total) by mouth daily with breakfast. 10/14/13  Yes Annett Gula, MD  ritonavir (NORVIR) 100 MG TABS tablet Take 1 tablet (100 mg total) by mouth daily with breakfast. 09/26/13  Yes Judyann Munson, MD  triamcinolone cream (KENALOG) 0.1 % Apply 1 application topically  4 (four) times daily as needed (itching).    Yes Historical Provider, MD  lamiVUDine (EPIVIR) 150 MG tablet Take 1 tablet (150 mg total) by mouth daily. 06/20/13   Vivi Barrack, MD  phenytoin (DILANTIN) 30 MG ER capsule Take 1 capsule (30 mg total) by mouth at bedtime. 10/14/13   Annett Gula, MD   BP 196/95  Pulse 79  Temp(Src) 98.5 F (36.9 C) (Oral)  Resp 20  SpO2 100% Physical Exam  Nursing note and vitals reviewed. Constitutional: She is oriented to person, place, and time. She appears well-developed and well-nourished. No distress.  HENT:  Head: Normocephalic and atraumatic.  Mouth/Throat: Oropharynx is clear and moist. No oropharyngeal exudate.  Eyes: Conjunctivae and EOM are normal. Pupils are equal, round, and reactive to light. No scleral icterus.  Neck: Normal range of motion. Neck supple. No thyromegaly present.  Cardiovascular: Normal rate, regular rhythm, normal heart sounds and intact distal pulses.  Exam reveals no gallop and no friction rub.   No murmur heard. Pulmonary/Chest: Effort normal and breath sounds normal. No respiratory distress. She has no wheezes. She has no rales. She exhibits no tenderness.  Abdominal: Soft. Bowel sounds are normal. She exhibits no distension and no mass. There is no tenderness.  There is no rebound and no guarding.  Musculoskeletal: Normal range of motion.  Lymphadenopathy:    She has no cervical adenopathy.  Neurological: She is alert and oriented to person, place, and time. No cranial nerve deficit. Coordination normal.  Skin: Skin is warm and dry. She is not diaphoretic.  Scattered excoriated lesions on the arms which are scabbed.  Not weeping anything at this time.  No petechia or purpura noted.  Psychiatric: She has a normal mood and affect. Her behavior is normal. Judgment and thought content normal.    ED Course  Procedures (including critical care time) Labs Review Labs Reviewed  CBC - Abnormal; Notable for the following:    WBC 3.7 (*)    RBC 2.21 (*)    Hemoglobin 8.1 (*)    HCT 23.8 (*)    MCV 107.7 (*)    MCH 36.7 (*)    All other components within normal limits  COMPREHENSIVE METABOLIC PANEL - Abnormal; Notable for the following:    CO2 14 (*)    Glucose, Bld 235 (*)    BUN 48 (*)    Creatinine, Ser 2.01 (*)    Calcium 7.8 (*)    Total Protein 5.7 (*)    Albumin 2.0 (*)    AST 129 (*)    ALT 99 (*)    Alkaline Phosphatase 471 (*)    GFR calc non Af Amer 27 (*)    GFR calc Af Amer 31 (*)    All other components within normal limits  SALICYLATE LEVEL - Abnormal; Notable for the following:    Salicylate Lvl <2.0 (*)    All other components within normal limits  ACETAMINOPHEN LEVEL  ETHANOL  URINE RAPID DRUG SCREEN (HOSP PERFORMED)    Imaging Review No results found.   EKG Interpretation None      MDM   1.Confusion 2.Hallucination 3.HIV positive 4. Hep C  Patient presents to the ED for increasing confusion, agitation, paranoia, and hallucinations.  Basic labs have been drawn at this time and the patient has been placed in the psych ED pending evaluation by telepsych. Patient has a history of HIV and Hep C.  Have added on an ammonia level at this time.  Patient has been signed out with Dr. Norlene Campbelltter at this time.       Clydie Braunourtney A Forucci, PA-C 11/20/13 [phone]

## 2013-11-20 NOTE — [facility] Suicide Risk Assessment (Signed)
Suicide Risk Assessment  Discharge Assessment     Demographic Factors:  Divorced or widowed and Living alone  Total Time spent with patient: 1 hour  Psychiatric Specialty Exam:     Blood pressure 184/80, pulse 79, temperature 98.9 F (37.2 C), temperature source Oral, resp. rate 18, SpO2 99.00%.There is no weight on file to calculate BMI.  General Appearance: Fairly Groomed  Patent attorney::  Good  Speech:  Clear and Coherent  Volume:  Normal  Mood:  Anxious  Affect:  Appropriate  Thought Process:  Coherent  Orientation:  Full (Time, Place, and Person)  Thought Content:  Negative  Suicidal Thoughts:  No  Homicidal Thoughts:  No  Memory:  Immediate;   Good Recent;   Good Remote;   Good  Judgement:  Fair  Insight:  Fair  Psychomotor Activity:  Normal  Concentration:  Good  Recall:  Good  Fund of Knowledge:Good  Language: Good  Akathisia:  Negative  Handed:  Right  AIMS (if indicated):     Assets:  Architect Housing Social Support  Sleep:       Musculoskeletal: Strength & Muscle Tone: within normal limits Gait & Station: normal Patient leans: N/A   Mental Status Per Nursing Assessment::   On Admission:     Current Mental Status by Physician: NA  Loss Factors: NA  Historical Factors: NA  Risk Reduction Factors:   NA  Continued Clinical Symptoms:  Medical Diagnoses and Treatments/Surgeries  Cognitive Features That Contribute To Risk:  none   Suicide Risk:  Minimal: No identifiable suicidal ideation.  Patients presenting with no risk factors but with morbid ruminations; may be classified as minimal risk based on the severity of the depressive symptoms  Discharge Diagnoses:   AXIS I:  Psychotic Disorder NOS AXIS II:  Deferred AXIS III:   Past Medical History  Diagnosis Date  . Seizures   . Stroke   . Meningitis   . HIV (human immunodeficiency virus infection)   . Hypertension   . Gout   . Muscle  spasms of head and/or neck   . CKD (chronic kidney disease)   . CHF (congestive heart failure)     [name] 06/18/2013  . HCV (hepatitis C virus)     chronic/notes 06/18/2013  . Type II diabetes mellitus     [name] 06/18/2013  . AIHA (autoimmune hemolytic anemia)     [name] 06/18/2013  . Hypertensive encephalopathy ~ 05/2013    hospitalaized/notes 06/18/2013  . Daily headache     "for the last 6 years/notes 06/18/2013  . Exertional shortness of breath     [name] 06/18/2013  . Anxiety     [name] 06/18/2013  . Nephrotic syndrome   . History of syphilis   . High cholesterol    AXIS IV:  chronic medical issues AXIS V:  61-70 mild symptoms  Plan Of Care/Follow-up recommendations:  Activity:  resume usual activity Diet:  resume usual diet  Is patient on multiple antipsychotic therapies at discharge:  No   Has Patient had three or more failed trials of antipsychotic monotherapy by history:  No  Recommended Plan for Multiple Antipsychotic Therapies: NA    TAYLOR,GERALD D 11/20/2013, 2:05 PM

## 2013-11-20 NOTE — Consult Note (Signed)
[facility] Face-to-Face Psychiatry Consult   Reason for Consult:  Paranoid ideation Referring Physician:  ER MD  KAYANN MAJ is an 53 y.o. female. Total Time spent with patient: 1 hour  Assessment: AXIS I:  Psychotic Disorder NOS AXIS II:  Deferred AXIS III:   Past Medical History  Diagnosis Date  . Seizures   . Stroke   . Meningitis   . HIV (human immunodeficiency virus infection)   . Hypertension   . Gout   . Muscle spasms of head and/or neck   . CKD (chronic kidney disease)   . CHF (congestive heart failure)     [name] 06/18/2013  . HCV (hepatitis C virus)     chronic/notes 06/18/2013  . Type II diabetes mellitus     [name] 06/18/2013  . AIHA (autoimmune hemolytic anemia)     [name] 06/18/2013  . Hypertensive encephalopathy ~ 05/2013    hospitalaized/notes 06/18/2013  . Daily headache     "for the last 6 years/notes 06/18/2013  . Exertional shortness of breath     [name] 06/18/2013  . Anxiety     [name] 06/18/2013  . Nephrotic syndrome   . History of syphilis   . High cholesterol    AXIS IV:  problems with primary support group AXIS V:  61-70 mild symptoms  Plan:  No evidence of imminent risk to self or others at present.    Subjective:   CHRISANNE LOOSE is a 53 y.o. female patient admitted with psychotic thinking.  HPI:  Ms Salak does not see any reason to be here.  Her sisters report that she has had paranoid thinking that people are spying on her, doing things to her medications, thinking people have been coming into her house and leaving dead roaches and moving things around.  Today she says she has concerns about her cousin who come to help her out not doing things properly as well as moving her medicines around.  "She can't be trusted".  She says she is HIV+ but is having no problems.  Says she has seizures and gets confused and cannot remember things some days. " Some days I'm fine and others I'm not",but cannot say why that is.  Her sisters report that some days she is fine  and some days she seems confused.  She has HIV, diabetes, Hepatitis C, high blood pressure, seizure disorder and a history of COPD reportedly with at least 20 medications.  Consequently it is hard to separate what is medical or medication side effects as he situation is not classically psychiatric. HPI Elements:   Location:  paranoid thoughts. Quality:  not threatening to herself or anyone else. Severity:  some days fine other days "confused". Timing:  no precipitants. Duration:  6 months. Context:  multiple medical problems.  Past Psychiatric History: Past Medical History  Diagnosis Date  . Seizures   . Stroke   . Meningitis   . HIV (human immunodeficiency virus infection)   . Hypertension   . Gout   . Muscle spasms of head and/or neck   . CKD (chronic kidney disease)   . CHF (congestive heart failure)     [name] 06/18/2013  . HCV (hepatitis C virus)     chronic/notes 06/18/2013  . Type II diabetes mellitus     [name] 06/18/2013  . AIHA (autoimmune hemolytic anemia)     [name] 06/18/2013  . Hypertensive encephalopathy ~ 05/2013    hospitalaized/notes 06/18/2013  . Daily headache     "for  the last 6 years/notes 06/18/2013  . Exertional shortness of breath     [name] 06/18/2013  . Anxiety     [name] 06/18/2013  . Nephrotic syndrome   . History of syphilis   . High cholesterol     reports that she quit smoking about 3 years ago. Her smoking use included Cigarettes. She smoked 0.00 packs per day. She has never used smokeless tobacco. She reports that she does not drink alcohol or use illicit drugs. Family History  Problem Relation Age of Onset  . Cancer - Colon Mother   . Cancer Father   . Hypertension Father   . Diabetes    . Diabetes Sister    Family History Substance Abuse: No Family Supports: Yes, List: (Various family members ) Living Arrangements: Alone Can pt return to current living arrangement?: Yes Abuse/Neglect [facility]) Physical Abuse: Denies Verbal Abuse:  Denies Sexual Abuse: Denies Allergies:   Allergies  Allergen Reactions  . 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 Morphine And Related Hives, Itching and Rash  . Norvasc [Amlodipine Besylate] Hives, Itching and Rash    ACT Assessment Complete:  Yes:    Educational Status    Risk to Self: Risk to self Suicidal Ideation: No Suicidal Intent: No Is patient at risk for suicide?: No Suicidal Plan?: No Access to Means: No What has been your use of drugs/alcohol within the last 12 months?: Pt has past hx cocaine, alcohol  Previous Attempts/Gestures: No How many times?: 0 Other Self Harm Risks: None  Triggers for Past Attempts: None known Intentional Self Injurious Behavior: None Family Suicide History: No Recent stressful life event(s): Other (Comment);Conflict (Comment) (Worsening health; issues with a family member) Persecutory voices/beliefs?: No Depression: Yes Depression Symptoms: Feeling angry/irritable Substance abuse history and/or treatment for substance abuse?: Yes Suicide prevention information given to non-admitted patients: Not applicable  Risk to Others: Risk to Others Homicidal Ideation: No Thoughts of Harm to Others: No Current Homicidal Intent: No Current Homicidal Plan: No Access to Homicidal Means: No Identified Victim: None  History of harm to others?: No Assessment of Violence: None Noted Violent Behavior Description: None  Does patient have access to weapons?: No Criminal Charges Pending?: No Does patient have a court date: No  Abuse: Abuse/Neglect Assessment (Assessment to be complete while patient is alone) Physical Abuse: Denies Verbal Abuse: Denies Sexual Abuse: Denies Exploitation of patient/patient's resources: Denies Self-Neglect: Denies  Prior Inpatient Therapy: Prior Inpatient Therapy Prior Inpatient Therapy: No Prior Therapy Dates: None  Prior Therapy Facilty/(s):  None  Reason for Treatment: None   Prior Outpatient Therapy: Prior Outpatient Therapy Prior Outpatient Therapy: No Prior Therapy Dates: None  Prior Therapy Facilty/(s): None  Reason for Treatment: None   Additional Information: Additional Information 1:1 In Past 12 Months?: No CIRT Risk: No Elopement Risk: No Does patient have medical clearance?: Yes                  Objective: Blood pressure 184/80, pulse 79, temperature 98.9 F (37.2 C), temperature source Oral, resp. rate 18, SpO2 99.00%.There is no weight on file to calculate BMI. Results for orders placed during the hospital encounter of 11/19/13 (from the past 72 hour(s))  ACETAMINOPHEN LEVEL     Status: None   Collection Time    11/19/13 10:08 PM      Result Value Ref Range   Acetaminophen (Tylenol), Serum <15.0  10 - 30 ug/mL  Comment:            THERAPEUTIC CONCENTRATIONS VARY     SIGNIFICANTLY. A RANGE OF 10-30     ug/mL MAY BE AN EFFECTIVE     CONCENTRATION FOR MANY PATIENTS.     HOWEVER, SOME ARE BEST TREATED     AT CONCENTRATIONS OUTSIDE THIS     RANGE.     ACETAMINOPHEN CONCENTRATIONS     >150 ug/mL AT 4 HOURS AFTER     INGESTION AND >50 ug/mL AT 12     HOURS AFTER INGESTION ARE     OFTEN ASSOCIATED WITH TOXIC     REACTIONS.  CBC     Status: Abnormal   Collection Time    11/19/13 10:08 PM      Result Value Ref Range   WBC 3.7 (*) 4.0 - 10.5 K/uL   RBC 2.21 (*) 3.87 - 5.11 MIL/uL   Hemoglobin 8.1 (*) 12.0 - 15.0 g/dL   HCT 23.8 (*) 36.0 - 46.0 %   MCV 107.7 (*) 78.0 - 100.0 fL   MCH 36.7 (*) 26.0 - 34.0 pg   MCHC 34.0  30.0 - 36.0 g/dL   RDW 13.1  11.5 - 15.5 %   Platelets 151  150 - 400 K/uL  COMPREHENSIVE METABOLIC PANEL     Status: Abnormal   Collection Time    11/19/13 10:08 PM      Result Value Ref Range   Sodium 142  137 - 147 mEq/L   Potassium 4.4  3.7 - 5.3 mEq/L   Chloride 112  96 - 112 mEq/L   CO2 14 (*) 19 - 32 mEq/L   Glucose, Bld 235 (*) 70 - 99 mg/dL   BUN 48  (*) 6 - 23 mg/dL   Creatinine, Ser 2.01 (*) 0.50 - 1.10 mg/dL   Calcium 7.8 (*) 8.4 - 10.5 mg/dL   Total Protein 5.7 (*) 6.0 - 8.3 g/dL   Albumin 2.0 (*) 3.5 - 5.2 g/dL   AST 129 (*) 0 - 37 U/L   ALT 99 (*) 0 - 35 U/L   Alkaline Phosphatase 471 (*) 39 - 117 U/L   Total Bilirubin 1.1  0.3 - 1.2 mg/dL   GFR calc non Af Amer 27 (*) >90 mL/min   GFR calc Af Amer 31 (*) >90 mL/min   Comment: (NOTE)     The eGFR has been calculated using the CKD EPI equation.     This calculation has not been validated in all clinical situations.     eGFR's persistently <90 mL/min signify possible Chronic Kidney     Disease.  ETHANOL     Status: None   Collection Time    11/19/13 10:08 PM      Result Value Ref Range   Alcohol, Ethyl (B) <11  0 - 11 mg/dL   Comment:            LOWEST DETECTABLE LIMIT FOR     SERUM ALCOHOL IS 11 mg/dL     FOR MEDICAL PURPOSES ONLY  SALICYLATE LEVEL     Status: Abnormal   Collection Time    11/19/13 10:08 PM      Result Value Ref Range   Salicylate Lvl <2.9 (*) 2.8 - 20.0 mg/dL  URINE RAPID DRUG SCREEN (HOSP PERFORMED)     Status: None   Collection Time    11/19/13 10:39 PM      Result Value Ref Range   Opiates NONE DETECTED  NONE DETECTED  Cocaine NONE DETECTED  NONE DETECTED   Benzodiazepines NONE DETECTED  NONE DETECTED   Amphetamines NONE DETECTED  NONE DETECTED   Tetrahydrocannabinol NONE DETECTED  NONE DETECTED   Barbiturates NONE DETECTED  NONE DETECTED   Comment:            DRUG SCREEN FOR MEDICAL PURPOSES     ONLY.  IF CONFIRMATION IS NEEDED     FOR ANY PURPOSE, NOTIFY LAB     WITHIN 5 DAYS.                LOWEST DETECTABLE LIMITS     FOR URINE DRUG SCREEN     Drug Class       Cutoff (ng/mL)     Amphetamine      1000     Barbiturate      200     Benzodiazepine   376     Tricyclics       283     Opiates          300     Cocaine          300     THC              50  AMMONIA     Status: Abnormal   Collection Time    11/20/13  1:32 AM       Result Value Ref Range   Ammonia 105 (*) 11 - 60 umol/L  CBG MONITORING, ED     Status: Abnormal   Collection Time    11/20/13  1:33 AM      Result Value Ref Range   Glucose-Capillary 139 (*) 70 - 99 mg/dL  URINALYSIS, ROUTINE W REFLEX MICROSCOPIC     Status: Abnormal   Collection Time    11/20/13  3:03 AM      Result Value Ref Range   Color, Urine YELLOW  YELLOW   APPearance CLEAR  CLEAR   Specific Gravity, Urine 1.014  1.005 - 1.030   pH 6.0  5.0 - 8.0   Glucose, UA 100 (*) NEGATIVE mg/dL   Hgb urine dipstick NEGATIVE  NEGATIVE   Bilirubin Urine NEGATIVE  NEGATIVE   Ketones, ur NEGATIVE  NEGATIVE mg/dL   Protein, ur >300 (*) NEGATIVE mg/dL   Urobilinogen, UA 1.0  0.0 - 1.0 mg/dL   Nitrite NEGATIVE  NEGATIVE   Leukocytes, UA NEGATIVE  NEGATIVE  URINE MICROSCOPIC-ADD ON     Status: Abnormal   Collection Time    11/20/13  3:03 AM      Result Value Ref Range   Squamous Epithelial / LPF RARE  RARE   WBC, UA 11-20  <3 WBC/hpf   RBC / HPF 0-2  <3 RBC/hpf   Bacteria, UA FEW (*) RARE   Casts HYALINE CASTS (*) NEGATIVE   Urine-Other FEW YEAST    CBG MONITORING, ED     Status: None   Collection Time    11/20/13  7:56 AM      Result Value Ref Range   Glucose-Capillary 93  70 - 99 mg/dL  CBG MONITORING, ED     Status: Abnormal   Collection Time    11/20/13 11:15 AM      Result Value Ref Range   Glucose-Capillary 275 (*) 70 - 99 mg/dL   Labs are reviewed and are pertinent for no psychiatric issues.  Current Facility-Administered Medications  Medication Dose Route Frequency Alfrieda Tarry Last Rate Last Dose  . abacavir (ZIAGEN)  tablet 300 mg  300 mg Oral BID Courtney A Forucci, PA-C   300 mg at 11/20/13 0935  . acyclovir (ZOVIRAX) tablet 800 mg  800 mg Oral Daily Courtney A Forucci, PA-C   800 mg at 11/20/13 0935  . aspirin EC tablet 81 mg  81 mg Oral Daily Courtney A Forucci, PA-C   81 mg at 11/20/13 0935  . cloNIDine (CATAPRES) tablet 0.3 mg  0.3 mg Oral TID Courtney A Forucci,  PA-C   0.3 mg at 11/20/13 0935  . Darunavir Ethanolate (PREZISTA) tablet 800 mg  800 mg Oral Q breakfast Courtney A Forucci, PA-C   800 mg at 11/20/13 0744  . furosemide (LASIX) tablet 80 mg  80 mg Oral BID Courtney A Forucci, PA-C   80 mg at 11/20/13 0744  . hydrALAZINE (APRESOLINE) tablet 100 mg  100 mg Oral TID Jamie Kato Forucci, PA-C   100 mg at 11/20/13 0935  . hydrOXYzine (ATARAX/VISTARIL) tablet 10 mg  10 mg Oral TID PRN Jamie Kato Forucci, PA-C   10 mg at 11/20/13 0355  . ibuprofen (ADVIL,MOTRIN) tablet 600 mg  600 mg Oral Q8H PRN Courtney A Forucci, PA-C      . insulin aspart (novoLOG) injection 0-9 Units  0-9 Units Subcutaneous TID WC Maudry Diego, MD   5 Units at 11/20/13 1139  . insulin aspart (novoLOG) injection 10-25 Units  10-25 Units Subcutaneous TID AC Courtney A Forucci, PA-C      . insulin glargine (LANTUS) injection 30 Units  30 Units Subcutaneous QHS Courtney A Forucci, PA-C   30 Units at 11/20/13 0144  . labetalol (NORMODYNE) tablet 400 mg  400 mg Oral BID Courtney A Forucci, PA-C   400 mg at 11/20/13 0935  . lamiVUDine (EPIVIR) tablet 150 mg  150 mg Oral Daily Courtney A Forucci, PA-C   150 mg at 11/20/13 1024  . levETIRAcetam (KEPPRA) tablet 1,500 mg  1,500 mg Oral BID Courtney A Forucci, PA-C   1,500 mg at 11/20/13 0935  . lisinopril (PRINIVIL,ZESTRIL) tablet 40 mg  40 mg Oral Daily Courtney A Forucci, PA-C   40 mg at 11/20/13 0935  . multivitamin with minerals tablet 1 tablet  1 tablet Oral Daily Courtney A Forucci, PA-C   1 tablet at 11/20/13 0935  . ondansetron (ZOFRAN) tablet 4 mg  4 mg Oral Q8H PRN Courtney A Forucci, PA-C      . pantoprazole (PROTONIX) EC tablet 40 mg  40 mg Oral Daily Courtney A Forucci, PA-C   40 mg at 11/20/13 0935  . phenytoin (DILANTIN) ER capsule 300 mg  300 mg Oral BH-q7a Courtney A Forucci, PA-C   300 mg at 11/20/13 0744  . potassium chloride SA (K-DUR,KLOR-CON) CR tablet 10 mEq  10 mEq Oral Daily Courtney A Forucci, PA-C   10 mEq at  11/20/13 1024  . ritonavir (NORVIR) tablet 100 mg  100 mg Oral Q breakfast Courtney A Forucci, PA-C   100 mg at 11/20/13 2951   Current Outpatient Prescriptions  Medication Sig Dispense Refill  . abacavir (ZIAGEN) 300 MG tablet Take 300 mg by mouth 2 (two) times daily.      [name] acyclovir (ZOVIRAX) 800 MG tablet Take 800 mg by mouth daily.       [name] aspirin EC 81 MG tablet Take 1 tablet (81 mg total) by mouth daily.  30 tablet  11  . cloNIDine (CATAPRES) 0.3 MG tablet Take 1 tablet (0.3 mg total) by mouth 3 (three) times daily.  90 tablet  0  . Darunavir Ethanolate (PREZISTA) 800 MG tablet Take 1 tablet (800 mg total) by mouth daily with breakfast.  30 tablet  11  . furosemide (LASIX) 80 MG tablet Take 1 tablet (80 mg total) by mouth 2 (two) times daily.  60 tablet  1  . hydrALAZINE (APRESOLINE) 50 MG tablet Take 2 tablets (100 mg total) by mouth 3 (three) times daily.  90 tablet  1  . hydrOXYzine (ATARAX/VISTARIL) 10 MG tablet Take 10 mg by mouth 3 (three) times daily as needed for itching.      . insulin aspart (NOVOLOG) 100 UNIT/ML injection Inject 10-25 Units into the skin 3 (three) times daily before meals. Based on sliding scale      . insulin glargine (LANTUS) 100 UNIT/ML injection Inject 0.3 mLs (30 Units total) into the skin at bedtime.  10 mL  2  . labetalol (NORMODYNE) 200 MG tablet Take 400 mg by mouth 2 (two) times daily.      [name] lamiVUDine (EPIVIR) 150 MG tablet Take 1 tablet (150 mg total) by mouth daily.  30 tablet  6  . levETIRAcetam (KEPPRA) 500 MG tablet Take 3 tablets (1,500 mg total) by mouth 2 (two) times daily.  120 tablet  1  . lisinopril (PRINIVIL,ZESTRIL) 40 MG tablet Take 1 tablet (40 mg total) by mouth daily.  90 tablet  3  . Multiple Vitamin (MULTIVITAMIN WITH MINERALS) TABS tablet Take 1 tablet by mouth daily.      . pantoprazole (PROTONIX) 40 MG tablet Take 1 tablet (40 mg total) by mouth daily.  30 tablet  11  . permethrin (ELIMITE) 5 % cream Apply to affected area once   60 g  1  . phenytoin (DILANTIN) 100 MG ER capsule Take 300 mg by mouth every morning.      . potassium chloride (K-DUR,KLOR-CON) 10 MEQ tablet Take 1 tablet (10 mEq total) by mouth daily.  30 tablet  1  . predniSONE (DELTASONE) 20 MG tablet Take 1.5 tablets (30 mg total) by mouth daily with breakfast.  30 tablet  1  . ritonavir (NORVIR) 100 MG TABS tablet Take 1 tablet (100 mg total) by mouth daily with breakfast.  30 tablet  11  . triamcinolone cream (KENALOG) 0.1 % Apply 1 application topically 4 (four) times daily as needed (itching).       . phenytoin (DILANTIN) 30 MG ER capsule Take 1 capsule (30 mg total) by mouth at bedtime.  30 capsule  1    Psychiatric Specialty Exam:     Blood pressure 184/80, pulse 79, temperature 98.9 F (37.2 C), temperature source Oral, resp. rate 18, SpO2 99.00%.There is no weight on file to calculate BMI.  General Appearance: Fairly Groomed  Engineer, water::  Good  Speech:  Clear and Coherent  Volume:  Normal  Mood:  Euthymic  Affect:  Appropriate  Thought Process:  Coherent  Orientation:  Full (Time, Place, and Person)  Thought Content:  Negative  Suicidal Thoughts:  No  Homicidal Thoughts:  No  Memory:  Immediate;   Good Recent;   Good Remote;   Good  Judgement:  Intact  Insight:  Fair  Psychomotor Activity:  Normal  Concentration:  Good  Recall:  Good  Fund of Knowledge:Good  Language: Good  Akathisia:  Negative  Handed:  Right  AIMS (if indicated):     Assets:  Communication Skills Desire for Improvement Housing Social Support  Sleep:      Musculoskeletal: Strength &  Muscle Tone: within normal limits Gait & Station: normal Patient leans: N/A  Treatment Plan Summary: discharge home to be followed outpatient.  Do not want to give antipsychotic medication because of so many medical issues which could be causing the paranoid thinking  TAYLOR,GERALD D 11/20/2013 1:39 PM
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
## 2013-11-20 NOTE — Progress Notes (Signed)
Late entry for 11/20/13 1445   CARE MANAGEMENT ED NOTE 11/20/2013  Patient:  Michelle [name], Michelle [name]   Account Number:  [id number]  Date Initiated:  11/20/2013  Documentation initiated by:  Edd Arbour  Subjective/Objective Assessment:   54 yr old [place] access in [place] ED for hallucinations, medical clearance     Subjective/Objective Assessment Detail:   [facility] staff, Baxter Hire confirmed pt is active with [facility], started seeing pt on Oct 17, 2013.  Only services pt can receive extra would be home health aide. Last visit was on November 13, 2013 Scheduled to be seen by SW on Friday 11/22/13 Pt seems to be seen once a week  by [facility] per records Pt not elgible for HHPT/OT nor ST as Medicaid per standards  pcp sadek nora     Action/Plan:   ED CM consulted by ED SW, Baxter Hire Drinkard Cm left a voice message for Baxter Hire of [facility] at 1341 Return call from [place] of [facility] at 1413 stating pt is eligible for possible home health aide Active with [facility]/SW   Action/Plan Detail:   Baxter Hire was updated of need to offer [facility] aide if available for pt in addition of other services   Anticipated DC Date:  11/20/2013     Status Recommendation to Physician:   Result of Recommendation:    Other ED Services  Consult Working Plan   In-house referral  Clinical Social Worker   DC Associate Professor  Other  Outpatient Services - Pt will follow up   [facility] Choice  HOME HEALTH   Choice offered to / List presented to:       [facility] arranged  HH-1 RN  HH-6 SOCIAL WORKER  HH-4 NURSE'S AIDE      HH agency  [facility]    Status of service:  Completed, signed off  ED Comments:   ED Comments Detail:

## 2013-11-20 NOTE — ED Notes (Addendum)
Chelsea Primus is patient's cousin and was present at bedside would like to be called if any questions arise or if patient is being discharged or transferred. [phone]

## 2013-11-25 ENCOUNTER — Encounter (HOSPITAL_COMMUNITY): Payer: Self-pay | Admitting: Emergency Medicine

## 2013-11-25 DIAGNOSIS — N058 Unspecified nephritic syndrome with other morphologic changes: Secondary | ICD-10-CM | POA: Diagnosis present

## 2013-11-25 DIAGNOSIS — Z7982 Long term (current) use of aspirin: Secondary | ICD-10-CM

## 2013-11-25 DIAGNOSIS — G43909 Migraine, unspecified, not intractable, without status migrainosus: Secondary | ICD-10-CM | POA: Diagnosis present

## 2013-11-25 DIAGNOSIS — F29 Unspecified psychosis not due to a substance or known physiological condition: Secondary | ICD-10-CM | POA: Diagnosis present

## 2013-11-25 DIAGNOSIS — I872 Venous insufficiency (chronic) (peripheral): Secondary | ICD-10-CM | POA: Diagnosis present

## 2013-11-25 DIAGNOSIS — D591 Autoimmune hemolytic anemia, unspecified: Principal | ICD-10-CM | POA: Diagnosis present

## 2013-11-25 DIAGNOSIS — E7800 Pure hypercholesterolemia, unspecified: Secondary | ICD-10-CM | POA: Diagnosis present

## 2013-11-25 DIAGNOSIS — M25519 Pain in unspecified shoulder: Secondary | ICD-10-CM | POA: Diagnosis not present

## 2013-11-25 DIAGNOSIS — E785 Hyperlipidemia, unspecified: Secondary | ICD-10-CM | POA: Diagnosis present

## 2013-11-25 DIAGNOSIS — D596 Hemoglobinuria due to hemolysis from other external causes: Secondary | ICD-10-CM | POA: Diagnosis present

## 2013-11-25 DIAGNOSIS — Z87891 Personal history of nicotine dependence: Secondary | ICD-10-CM

## 2013-11-25 DIAGNOSIS — G40909 Epilepsy, unspecified, not intractable, without status epilepticus: Secondary | ICD-10-CM | POA: Diagnosis present

## 2013-11-25 DIAGNOSIS — I129 Hypertensive chronic kidney disease with stage 1 through stage 4 chronic kidney disease, or unspecified chronic kidney disease: Secondary | ICD-10-CM | POA: Diagnosis present

## 2013-11-25 DIAGNOSIS — K7460 Unspecified cirrhosis of liver: Secondary | ICD-10-CM | POA: Diagnosis present

## 2013-11-25 DIAGNOSIS — L259 Unspecified contact dermatitis, unspecified cause: Secondary | ICD-10-CM | POA: Diagnosis present

## 2013-11-25 DIAGNOSIS — E46 Unspecified protein-calorie malnutrition: Secondary | ICD-10-CM | POA: Diagnosis present

## 2013-11-25 DIAGNOSIS — N032 Chronic nephritic syndrome with diffuse membranous glomerulonephritis: Secondary | ICD-10-CM | POA: Diagnosis present

## 2013-11-25 DIAGNOSIS — I509 Heart failure, unspecified: Secondary | ICD-10-CM | POA: Diagnosis present

## 2013-11-25 DIAGNOSIS — M109 Gout, unspecified: Secondary | ICD-10-CM | POA: Diagnosis present

## 2013-11-25 DIAGNOSIS — Z794 Long term (current) use of insulin: Secondary | ICD-10-CM

## 2013-11-25 DIAGNOSIS — F411 Generalized anxiety disorder: Secondary | ICD-10-CM | POA: Diagnosis present

## 2013-11-25 DIAGNOSIS — IMO0002 Reserved for concepts with insufficient information to code with codable children: Secondary | ICD-10-CM

## 2013-11-25 DIAGNOSIS — N183 Chronic kidney disease, stage 3 unspecified: Secondary | ICD-10-CM | POA: Diagnosis present

## 2013-11-25 DIAGNOSIS — N049 Nephrotic syndrome with unspecified morphologic changes: Secondary | ICD-10-CM | POA: Diagnosis present

## 2013-11-25 DIAGNOSIS — Z8673 Personal history of transient ischemic attack (TIA), and cerebral infarction without residual deficits: Secondary | ICD-10-CM

## 2013-11-25 DIAGNOSIS — B20 Human immunodeficiency virus [HIV] disease: Secondary | ICD-10-CM | POA: Diagnosis present

## 2013-11-25 DIAGNOSIS — B1920 Unspecified viral hepatitis C without hepatic coma: Secondary | ICD-10-CM | POA: Diagnosis present

## 2013-11-25 DIAGNOSIS — Z79899 Other long term (current) drug therapy: Secondary | ICD-10-CM

## 2013-11-25 DIAGNOSIS — D649 Anemia, unspecified: Secondary | ICD-10-CM | POA: Diagnosis present

## 2013-11-25 DIAGNOSIS — K219 Gastro-esophageal reflux disease without esophagitis: Secondary | ICD-10-CM | POA: Diagnosis present

## 2013-11-25 DIAGNOSIS — E119 Type 2 diabetes mellitus without complications: Secondary | ICD-10-CM | POA: Diagnosis present

## 2013-11-25 NOTE — ED Notes (Signed)
Pt in via EMS to triage c/o fluid increased to bilateral lower legs, denies pain, pt ambulatory

## 2013-11-26 ENCOUNTER — Emergency Department (HOSPITAL_COMMUNITY): Payer: Medicaid Other

## 2013-11-26 ENCOUNTER — Inpatient Hospital Stay (HOSPITAL_COMMUNITY)
Admission: EM | Admit: 2013-11-26 | Discharge: 2013-12-03 | DRG: 977 | Disposition: A | Discharge status: Home Health | Payer: Medicaid Other | Attending: Oncology | Admitting: Oncology

## 2013-11-26 DIAGNOSIS — B1920 Unspecified viral hepatitis C without hepatic coma: Secondary | ICD-10-CM

## 2013-11-26 DIAGNOSIS — K7460 Unspecified cirrhosis of liver: Secondary | ICD-10-CM | POA: Diagnosis present

## 2013-11-26 DIAGNOSIS — D591 Autoimmune hemolytic anemia, unspecified: Secondary | ICD-10-CM | POA: Diagnosis present

## 2013-11-26 DIAGNOSIS — E098 Drug or chemical induced diabetes mellitus with unspecified complications: Secondary | ICD-10-CM

## 2013-11-26 DIAGNOSIS — E8770 Fluid overload, unspecified: Secondary | ICD-10-CM

## 2013-11-26 DIAGNOSIS — I509 Heart failure, unspecified: Secondary | ICD-10-CM

## 2013-11-26 DIAGNOSIS — F411 Generalized anxiety disorder: Secondary | ICD-10-CM | POA: Diagnosis present

## 2013-11-26 DIAGNOSIS — L259 Unspecified contact dermatitis, unspecified cause: Secondary | ICD-10-CM | POA: Diagnosis present

## 2013-11-26 DIAGNOSIS — N189 Chronic kidney disease, unspecified: Secondary | ICD-10-CM

## 2013-11-26 DIAGNOSIS — M109 Gout, unspecified: Secondary | ICD-10-CM | POA: Diagnosis present

## 2013-11-26 DIAGNOSIS — E119 Type 2 diabetes mellitus without complications: Secondary | ICD-10-CM | POA: Diagnosis present

## 2013-11-26 DIAGNOSIS — E785 Hyperlipidemia, unspecified: Secondary | ICD-10-CM | POA: Diagnosis present

## 2013-11-26 DIAGNOSIS — N184 Chronic kidney disease, stage 4 (severe): Secondary | ICD-10-CM | POA: Diagnosis present

## 2013-11-26 DIAGNOSIS — I10 Essential (primary) hypertension: Secondary | ICD-10-CM | POA: Diagnosis present

## 2013-11-26 DIAGNOSIS — D649 Anemia, unspecified: Secondary | ICD-10-CM | POA: Diagnosis present

## 2013-11-26 DIAGNOSIS — Z87891 Personal history of nicotine dependence: Secondary | ICD-10-CM | POA: Diagnosis not present

## 2013-11-26 DIAGNOSIS — E0829 Diabetes mellitus due to underlying condition with other diabetic kidney complication: Secondary | ICD-10-CM

## 2013-11-26 DIAGNOSIS — E46 Unspecified protein-calorie malnutrition: Secondary | ICD-10-CM | POA: Diagnosis present

## 2013-11-26 DIAGNOSIS — I872 Venous insufficiency (chronic) (peripheral): Secondary | ICD-10-CM | POA: Diagnosis present

## 2013-11-26 DIAGNOSIS — I5189 Other ill-defined heart diseases: Secondary | ICD-10-CM | POA: Diagnosis present

## 2013-11-26 DIAGNOSIS — N058 Unspecified nephritic syndrome with other morphologic changes: Secondary | ICD-10-CM | POA: Diagnosis present

## 2013-11-26 DIAGNOSIS — IMO0002 Reserved for concepts with insufficient information to code with codable children: Secondary | ICD-10-CM | POA: Diagnosis not present

## 2013-11-26 DIAGNOSIS — M25519 Pain in unspecified shoulder: Secondary | ICD-10-CM | POA: Diagnosis not present

## 2013-11-26 DIAGNOSIS — K219 Gastro-esophageal reflux disease without esophagitis: Secondary | ICD-10-CM | POA: Diagnosis present

## 2013-11-26 DIAGNOSIS — R569 Unspecified convulsions: Secondary | ICD-10-CM | POA: Diagnosis present

## 2013-11-26 DIAGNOSIS — N049 Nephrotic syndrome with unspecified morphologic changes: Secondary | ICD-10-CM | POA: Diagnosis present

## 2013-11-26 DIAGNOSIS — Z7982 Long term (current) use of aspirin: Secondary | ICD-10-CM | POA: Diagnosis not present

## 2013-11-26 DIAGNOSIS — G43909 Migraine, unspecified, not intractable, without status migrainosus: Secondary | ICD-10-CM | POA: Diagnosis present

## 2013-11-26 DIAGNOSIS — Z79899 Other long term (current) drug therapy: Secondary | ICD-10-CM | POA: Diagnosis not present

## 2013-11-26 DIAGNOSIS — B20 Human immunodeficiency virus [HIV] disease: Secondary | ICD-10-CM

## 2013-11-26 DIAGNOSIS — D596 Hemoglobinuria due to hemolysis from other external causes: Secondary | ICD-10-CM | POA: Diagnosis present

## 2013-11-26 DIAGNOSIS — N183 Chronic kidney disease, stage 3 unspecified: Secondary | ICD-10-CM | POA: Diagnosis present

## 2013-11-26 DIAGNOSIS — Z794 Long term (current) use of insulin: Secondary | ICD-10-CM | POA: Diagnosis not present

## 2013-11-26 DIAGNOSIS — I129 Hypertensive chronic kidney disease with stage 1 through stage 4 chronic kidney disease, or unspecified chronic kidney disease: Secondary | ICD-10-CM | POA: Diagnosis present

## 2013-11-26 DIAGNOSIS — Z8673 Personal history of transient ischemic attack (TIA), and cerebral infarction without residual deficits: Secondary | ICD-10-CM | POA: Diagnosis not present

## 2013-11-26 DIAGNOSIS — F29 Unspecified psychosis not due to a substance or known physiological condition: Secondary | ICD-10-CM | POA: Diagnosis present

## 2013-11-26 DIAGNOSIS — N032 Chronic nephritic syndrome with diffuse membranous glomerulonephritis: Secondary | ICD-10-CM | POA: Diagnosis present

## 2013-11-26 DIAGNOSIS — D631 Anemia in chronic kidney disease: Secondary | ICD-10-CM

## 2013-11-26 DIAGNOSIS — E7800 Pure hypercholesterolemia, unspecified: Secondary | ICD-10-CM | POA: Diagnosis present

## 2013-11-26 DIAGNOSIS — G40909 Epilepsy, unspecified, not intractable, without status epilepticus: Secondary | ICD-10-CM | POA: Diagnosis present

## 2013-11-26 HISTORY — DX: Unspecified cirrhosis of liver: K74.60

## 2013-11-26 HISTORY — DX: Migraine, unspecified, not intractable, without status migrainosus: G43.909

## 2013-11-26 HISTORY — DX: Gastro-esophageal reflux disease without esophagitis: K21.9

## 2013-11-26 LAB — CBC
HEMATOCRIT: 21.8 % — AB (ref 36.0–46.0)
HEMOGLOBIN: 7.1 g/dL — AB (ref 12.0–15.0)
MCH: 35.3 pg — AB (ref 26.0–34.0)
MCHC: 32.6 g/dL (ref 30.0–36.0)
MCV: 108.5 fL — ABNORMAL HIGH (ref 78.0–100.0)
PLATELETS: 132 10*3/uL — AB (ref 150–400)
RBC: 2.01 MIL/uL — AB (ref 3.87–5.11)
RDW: 13.4 % (ref 11.5–15.5)
WBC: 2.9 10*3/uL — AB (ref 4.0–10.5)

## 2013-11-26 LAB — CBC WITH DIFFERENTIAL/PLATELET
BASOS ABS: 0 10*3/uL (ref 0.0–0.1)
Basophils Relative: 0 % (ref 0–1)
EOS ABS: 0 10*3/uL (ref 0.0–0.7)
Eosinophils Relative: 1 % (ref 0–5)
HCT: 20.3 % — ABNORMAL LOW (ref 36.0–46.0)
Hemoglobin: 6.7 g/dL — CL (ref 12.0–15.0)
LYMPHS ABS: 1.3 10*3/uL (ref 0.7–4.0)
LYMPHS PCT: 40 % (ref 12–46)
MCH: 35.6 pg — ABNORMAL HIGH (ref 26.0–34.0)
MCHC: 33 g/dL (ref 30.0–36.0)
MCV: 108 fL — ABNORMAL HIGH (ref 78.0–100.0)
Monocytes Absolute: 0.6 10*3/uL (ref 0.1–1.0)
Monocytes Relative: 17 % — ABNORMAL HIGH (ref 3–12)
Neutro Abs: 1.3 10*3/uL — ABNORMAL LOW (ref 1.7–7.7)
Neutrophils Relative %: 41 % — ABNORMAL LOW (ref 43–77)
PLATELETS: 157 10*3/uL (ref 150–400)
RBC: 1.88 MIL/uL — AB (ref 3.87–5.11)
RDW: 13.3 % (ref 11.5–15.5)
WBC: 3.2 10*3/uL — AB (ref 4.0–10.5)

## 2013-11-26 LAB — COMPREHENSIVE METABOLIC PANEL
ALBUMIN: 2 g/dL — AB (ref 3.5–5.2)
ALK PHOS: 397 U/L — AB (ref 39–117)
ALT: 55 U/L — AB (ref 0–35)
AST: 77 U/L — AB (ref 0–37)
BUN: 35 mg/dL — ABNORMAL HIGH (ref 6–23)
CO2: 15 mEq/L — ABNORMAL LOW (ref 19–32)
Calcium: 7.8 mg/dL — ABNORMAL LOW (ref 8.4–10.5)
Chloride: 115 mEq/L — ABNORMAL HIGH (ref 96–112)
Creatinine, Ser: 2.08 mg/dL — ABNORMAL HIGH (ref 0.50–1.10)
GFR calc non Af Amer: 26 mL/min — ABNORMAL LOW (ref >90)
GFR, EST AFRICAN AMERICAN: 30 mL/min — AB (ref >90)
Glucose, Bld: 128 mg/dL — ABNORMAL HIGH (ref 70–99)
POTASSIUM: 4 meq/L (ref 3.7–5.3)
SODIUM: 145 meq/L (ref 137–147)
TOTAL PROTEIN: 5.6 g/dL — AB (ref 6.0–8.3)
Total Bilirubin: 1 mg/dL (ref 0.3–1.2)

## 2013-11-26 LAB — GLUCOSE, CAPILLARY
GLUCOSE-CAPILLARY: 158 mg/dL — AB (ref 70–99)
Glucose-Capillary: 193 mg/dL — ABNORMAL HIGH (ref 70–99)
Glucose-Capillary: 189 mg/dL — ABNORMAL HIGH (ref 70–99)

## 2013-11-26 LAB — HAPTOGLOBIN

## 2013-11-26 LAB — LACTATE DEHYDROGENASE: LDH: 387 U/L — ABNORMAL HIGH (ref 94–250)

## 2013-11-26 LAB — PRO B NATRIURETIC PEPTIDE: Pro B Natriuretic peptide (BNP): 794.9 pg/mL — ABNORMAL HIGH (ref 0–125)

## 2013-11-26 LAB — MRSA PCR SCREENING: MRSA BY PCR: POSITIVE — AB

## 2013-11-26 LAB — RETICULOCYTES
RBC.: 1.97 MIL/uL — AB (ref 3.87–5.11)
RETIC COUNT ABSOLUTE: 53.2 10*3/uL (ref 19.0–186.0)
RETIC CT PCT: 2.7 % (ref 0.4–3.1)

## 2013-11-26 LAB — CREATININE, SERUM
Creatinine, Ser: 1.99 mg/dL — ABNORMAL HIGH (ref 0.50–1.10)
GFR calc Af Amer: 32 mL/min — ABNORMAL LOW (ref >90)
GFR calc non Af Amer: 27 mL/min — ABNORMAL LOW (ref >90)

## 2013-11-26 LAB — PREPARE RBC (CROSSMATCH)

## 2013-11-26 LAB — OCCULT BLOOD, POC DEVICE: FECAL OCCULT BLD: NEGATIVE

## 2013-11-26 LAB — TROPONIN I: Troponin I: <0.3 ng/mL (ref <0.30)

## 2013-11-26 MED ORDER — CHLORHEXIDINE GLUCONATE CLOTH 2 % EX PADS
6.0000 | MEDICATED_PAD | Freq: Every day | CUTANEOUS | Status: AC
  Administered 2013-11-27 – 2013-12-01 (×5): 6 via TOPICAL

## 2013-11-26 MED ORDER — INSULIN ASPART 100 UNIT/ML ~~LOC~~ SOLN
0.0000 [IU] | Freq: Three times a day (TID) | SUBCUTANEOUS | Status: DC
  Administered 2013-11-26 (×2): 3 [IU] via SUBCUTANEOUS
  Administered 2013-11-27 (×2): 2 [IU] via SUBCUTANEOUS
  Administered 2013-11-27: 5 [IU] via SUBCUTANEOUS
  Administered 2013-11-28: 15 [IU] via SUBCUTANEOUS
  Administered 2013-11-28 – 2013-11-29 (×2): 3 [IU] via SUBCUTANEOUS
  Administered 2013-11-29: 8 [IU] via SUBCUTANEOUS
  Administered 2013-11-29 – 2013-11-30 (×2): 15 [IU] via SUBCUTANEOUS
  Administered 2013-11-30: 5 [IU] via SUBCUTANEOUS
  Administered 2013-11-30: 2 [IU] via SUBCUTANEOUS
  Administered 2013-12-01 (×2): 11 [IU] via SUBCUTANEOUS
  Administered 2013-12-01: 2 [IU] via SUBCUTANEOUS
  Administered 2013-12-02: 20 [IU] via SUBCUTANEOUS
  Administered 2013-12-02: 3 [IU] via SUBCUTANEOUS
  Administered 2013-12-02: 20 [IU] via SUBCUTANEOUS
  Administered 2013-12-03: 5 [IU] via SUBCUTANEOUS
  Administered 2013-12-03: 2 [IU] via SUBCUTANEOUS

## 2013-11-26 MED ORDER — HYDROXYZINE HCL 10 MG PO TABS
10.0000 mg | ORAL_TABLET | Freq: Three times a day (TID) | ORAL | Status: DC | PRN
  Administered 2013-11-27 – 2013-12-02 (×6): 10 mg via ORAL
  Filled 2013-11-26 (×8): qty 1

## 2013-11-26 MED ORDER — LISINOPRIL 40 MG PO TABS
40.0000 mg | ORAL_TABLET | Freq: Every day | ORAL | Status: DC
  Administered 2013-11-26 – 2013-12-03 (×8): 40 mg via ORAL
  Filled 2013-11-26 (×8): qty 1

## 2013-11-26 MED ORDER — CLONIDINE HCL 0.3 MG PO TABS
0.3000 mg | ORAL_TABLET | Freq: Three times a day (TID) | ORAL | Status: DC
  Administered 2013-11-26 – 2013-12-03 (×23): 0.3 mg via ORAL
  Filled 2013-11-26 (×24): qty 1

## 2013-11-26 MED ORDER — POTASSIUM CHLORIDE CRYS ER 10 MEQ PO TBCR
10.0000 meq | EXTENDED_RELEASE_TABLET | Freq: Every day | ORAL | Status: DC
  Administered 2013-11-26 – 2013-12-03 (×8): 10 meq via ORAL
  Filled 2013-11-26 (×8): qty 1

## 2013-11-26 MED ORDER — ADULT MULTIVITAMIN W/MINERALS CH
1.0000 | ORAL_TABLET | Freq: Every day | ORAL | Status: DC
  Administered 2013-11-26 – 2013-12-03 (×8): 1 via ORAL
  Filled 2013-11-26 (×8): qty 1

## 2013-11-26 MED ORDER — RITONAVIR 100 MG PO CAPS
100.0000 mg | ORAL_CAPSULE | Freq: Every day | ORAL | Status: DC
  Administered 2013-11-26 – 2013-12-03 (×8): 100 mg via ORAL
  Filled 2013-11-26 (×9): qty 1

## 2013-11-26 MED ORDER — FUROSEMIDE 80 MG PO TABS
80.0000 mg | ORAL_TABLET | Freq: Two times a day (BID) | ORAL | Status: DC
  Administered 2013-11-27 – 2013-12-03 (×13): 80 mg via ORAL
  Filled 2013-11-26 (×16): qty 1

## 2013-11-26 MED ORDER — ASPIRIN EC 81 MG PO TBEC
81.0000 mg | DELAYED_RELEASE_TABLET | Freq: Every day | ORAL | Status: DC
  Administered 2013-11-26 – 2013-12-03 (×8): 81 mg via ORAL
  Filled 2013-11-26 (×8): qty 1

## 2013-11-26 MED ORDER — HYDRALAZINE HCL 50 MG PO TABS
50.0000 mg | ORAL_TABLET | Freq: Three times a day (TID) | ORAL | Status: DC
  Administered 2013-11-26 – 2013-12-03 (×23): 50 mg via ORAL
  Filled 2013-11-26 (×24): qty 1

## 2013-11-26 MED ORDER — LAMIVUDINE 150 MG PO TABS
150.0000 mg | ORAL_TABLET | Freq: Every day | ORAL | Status: DC
  Administered 2013-11-26 – 2013-11-29 (×4): 150 mg via ORAL
  Filled 2013-11-26 (×5): qty 1

## 2013-11-26 MED ORDER — PHENYTOIN SODIUM EXTENDED 100 MG PO CAPS
300.0000 mg | ORAL_CAPSULE | Freq: Every day | ORAL | Status: DC
  Administered 2013-11-26 – 2013-12-03 (×8): 300 mg via ORAL
  Filled 2013-11-26 (×10): qty 3

## 2013-11-26 MED ORDER — INSULIN ASPART 100 UNIT/ML ~~LOC~~ SOLN: 10.0000 [IU] | Freq: Three times a day (TID) | SUBCUTANEOUS | Status: DC

## 2013-11-26 MED ORDER — DARUNAVIR ETHANOLATE 800 MG PO TABS
800.0000 mg | ORAL_TABLET | Freq: Every day | ORAL | Status: DC
  Administered 2013-11-26 – 2013-12-03 (×8): 800 mg via ORAL
  Filled 2013-11-26 (×9): qty 1

## 2013-11-26 MED ORDER — INSULIN GLARGINE 100 UNIT/ML ~~LOC~~ SOLN
30.0000 [IU] | Freq: Every day | SUBCUTANEOUS | Status: DC
  Administered 2013-11-26 – 2013-12-02 (×7): 30 [IU] via SUBCUTANEOUS
  Filled 2013-11-26 (×8): qty 0.3

## 2013-11-26 MED ORDER — MUPIROCIN 2 % EX OINT
1.0000 "application " | TOPICAL_OINTMENT | Freq: Two times a day (BID) | CUTANEOUS | Status: AC
  Administered 2013-11-26 – 2013-11-30 (×9): 1 via NASAL
  Filled 2013-11-26: qty 22

## 2013-11-26 MED ORDER — LEVETIRACETAM 750 MG PO TABS
1500.0000 mg | ORAL_TABLET | Freq: Two times a day (BID) | ORAL | Status: DC
  Administered 2013-11-26 – 2013-12-03 (×15): 1500 mg via ORAL
  Filled 2013-11-26 (×17): qty 2

## 2013-11-26 MED ORDER — HEPARIN SODIUM (PORCINE) 5000 UNIT/ML IJ SOLN
5000.0000 [IU] | Freq: Three times a day (TID) | INTRAMUSCULAR | Status: DC
  Administered 2013-11-27: 5000 [IU] via SUBCUTANEOUS
  Filled 2013-11-26 (×4): qty 1

## 2013-11-26 MED ORDER — FUROSEMIDE 10 MG/ML IJ SOLN: 80.0000 mg | Freq: Once | INTRAMUSCULAR | Status: DC

## 2013-11-26 MED ORDER — PREDNISONE 20 MG PO TABS
30.0000 mg | ORAL_TABLET | Freq: Every day | ORAL | Status: DC
  Administered 2013-11-27 – 2013-12-03 (×7): 30 mg via ORAL
  Filled 2013-11-26 (×8): qty 1

## 2013-11-26 MED ORDER — ACETAMINOPHEN 325 MG PO TABS
650.0000 mg | ORAL_TABLET | Freq: Once | ORAL | Status: AC
  Administered 2013-11-26: 650 mg via ORAL
  Filled 2013-11-26: qty 2

## 2013-11-26 MED ORDER — PANTOPRAZOLE SODIUM 40 MG PO TBEC
40.0000 mg | DELAYED_RELEASE_TABLET | Freq: Every day | ORAL | Status: DC
  Administered 2013-11-26 – 2013-12-03 (×8): 40 mg via ORAL
  Filled 2013-11-26 (×5): qty 1

## 2013-11-26 MED ORDER — LABETALOL HCL 200 MG PO TABS
400.0000 mg | ORAL_TABLET | Freq: Two times a day (BID) | ORAL | Status: DC
  Administered 2013-11-26 – 2013-12-03 (×15): 400 mg via ORAL
  Filled 2013-11-26 (×16): qty 2

## 2013-11-26 MED ORDER — HEPARIN SODIUM (PORCINE) 5000 UNIT/ML IJ SOLN: 5000.0000 [IU] | Freq: Three times a day (TID) | INTRAMUSCULAR | Status: DC

## 2013-11-26 MED ORDER — CHLORHEXIDINE GLUCONATE 4 % EX LIQD
Freq: Every morning | CUTANEOUS | Status: DC
  Filled 2013-11-26: qty 15

## 2013-11-26 MED ORDER — MUPIROCIN CALCIUM 2 % EX CREA
TOPICAL_CREAM | Freq: Two times a day (BID) | CUTANEOUS | Status: DC
  Filled 2013-11-26: qty 15

## 2013-11-26 MED ORDER — FUROSEMIDE 10 MG/ML IJ SOLN
80.0000 mg | Freq: Once | INTRAMUSCULAR | Status: AC
  Administered 2013-11-26: 80 mg via INTRAMUSCULAR
  Filled 2013-11-26: qty 8

## 2013-11-26 MED ORDER — ABACAVIR SULFATE 300 MG PO TABS
300.0000 mg | ORAL_TABLET | Freq: Two times a day (BID) | ORAL | Status: DC
  Administered 2013-11-26 – 2013-12-03 (×15): 300 mg via ORAL
  Filled 2013-11-26 (×17): qty 1

## 2013-11-26 MED ORDER — ENOXAPARIN SODIUM 40 MG/0.4ML ~~LOC~~ SOLN
40.0000 mg | SUBCUTANEOUS | Status: DC
  Administered 2013-11-26: 40 mg via SUBCUTANEOUS
  Filled 2013-11-26: qty 0.4

## 2013-11-26 MED ORDER — ACYCLOVIR 800 MG PO TABS
800.0000 mg | ORAL_TABLET | Freq: Every day | ORAL | Status: DC
  Administered 2013-11-26 – 2013-12-03 (×8): 800 mg via ORAL
  Filled 2013-11-26 (×8): qty 1

## 2013-11-26 NOTE — ED Notes (Signed)
Attempted to call report to [facility] unit nurse.

## 2013-11-26 NOTE — H&P (Signed)
patient UNABLE TO COMPLETE HISTORY AT THIS TIME
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
## 2013-11-26 NOTE — Progress Notes (Addendum)
Patient supposed to get blood earlier today but patient has no IV access.  Was told in report that IV team has tried multiple attempts as well as in the ED.  Notified night MD about this.  Type and cross was successful tonight.  Will call blood bank about Hgb being above 7 and the possibility of not administering it tonight. Continuing to monitor   2300-- called blood bank about patient not getting blood.  They stated that the blood will stay there for her if needed for the next 3 days.    MD notified and made aware.

## 2013-11-26 NOTE — ED Provider Notes (Signed)
CSN: [id number]     Arrival date & time 11/25/13  2258 History   First MD Initiated Contact with Patient 11/26/13 0112     Chief Complaint  Patient presents with  . Leg Swelling     (Consider location/radiation/quality/duration/timing/severity/associated sxs/prior Treatment) HPI Patient presents with several days of increased lower extremity swelling. She also complains of increasing dyspnea with exertion and shortness of breath when lying in a supine position. She denies any cough or fever. She states she's been compliant with her medication. She denies pain at this time. Denies any GI bleeding. Past Medical History  Diagnosis Date  . Seizures   . Stroke   . Meningitis   . HIV (human immunodeficiency virus infection)   . Hypertension   . Gout   . Muscle spasms of head and/or neck   . CKD (chronic kidney disease)   . CHF (congestive heart failure)     [name] 06/18/2013  . HCV (hepatitis C virus)     chronic/notes 06/18/2013  . Type II diabetes mellitus     [name] 06/18/2013  . AIHA (autoimmune hemolytic anemia)     [name] 06/18/2013  . Hypertensive encephalopathy ~ 05/2013    hospitalaized/notes 06/18/2013  . Daily headache     "for the last 6 years/notes 06/18/2013  . Exertional shortness of breath     [name] 06/18/2013  . Anxiety     [name] 06/18/2013  . Nephrotic syndrome   . History of syphilis   . High cholesterol    Past Surgical History  Procedure Laterality Date  . Hip pinning Right   . Av fistula placement Right 07/24/2013    Procedure: RIGHT arm exploration of antecubital space;  Surgeon: Sherren Kerns, MD;  Location: [facility] OR;  Service: Vascular;  Laterality: Right;   Family History  Problem Relation Age of Onset  . Cancer - Colon Mother   . Cancer Father   . Hypertension Father   . Diabetes    . Diabetes Sister    History  Substance Use Topics  . Smoking status: Former Smoker    Types: Cigarettes    Quit date: 06/19/2010  . Smokeless tobacco: Never Used   . Alcohol Use: No   OB History   Grav Para Term Preterm Abortions TAB SAB Ect Mult Living                 Review of Systems  Constitutional: Negative for fever and chills.  Respiratory: Positive for shortness of breath. Negative for cough and wheezing.   Cardiovascular: Positive for leg swelling. Negative for chest pain and palpitations.  Gastrointestinal: Negative for nausea, vomiting, abdominal pain and diarrhea.  Genitourinary: Negative for dysuria, frequency and flank pain.  Musculoskeletal: Negative for back pain, neck pain and neck stiffness.  Skin: Negative for rash and wound.  Neurological: Negative for dizziness, seizures, syncope, weakness, light-headedness, numbness and headaches.  All other systems reviewed and are negative.     Allergies  Ceftriaxone; Morphine and related; and Norvasc  Home Medications   Prior to Admission medications   Medication Sig Start Date End Date Taking? Authorizing Provider  abacavir (ZIAGEN) 300 MG tablet Take 300 mg by mouth 2 (two) times daily.   Yes Historical Provider, MD  acyclovir (ZOVIRAX) 800 MG tablet Take 800 mg by mouth daily.    Yes Historical Provider, MD  aspirin EC 81 MG tablet Take 81 mg by mouth daily.   Yes Historical Provider, MD  cloNIDine (CATAPRES) 0.3 MG tablet  Take 0.3 mg by mouth 3 (three) times daily.   Yes Historical Provider, MD  Darunavir Ethanolate (PREZISTA) 800 MG tablet Take 800 mg by mouth daily with breakfast.   Yes Historical Provider, MD  furosemide (LASIX) 80 MG tablet Take 80 mg by mouth 2 (two) times daily.   Yes Historical Provider, MD  hydrALAZINE (APRESOLINE) 50 MG tablet Take 50 mg by mouth 3 (three) times daily.   Yes Historical Provider, MD  hydrOXYzine (ATARAX/VISTARIL) 10 MG tablet Take 10 mg by mouth 3 (three) times daily as needed for itching.   Yes Historical Provider, MD  insulin aspart (NOVOLOG) 100 UNIT/ML injection Inject 10-25 Units into the skin 3 (three) times daily before meals.  Based on sliding scale   Yes Historical Provider, MD  insulin glargine (LANTUS) 100 UNIT/ML injection Inject 0.3 mLs (30 Units total) into the skin at bedtime. 10/14/13  Yes Annett Gula, MD  labetalol (NORMODYNE) 200 MG tablet Take 400 mg by mouth 2 (two) times daily.   Yes Historical Provider, MD  lamiVUDine (EPIVIR) 150 MG tablet Take 150 mg by mouth daily.   Yes Historical Provider, MD  levETIRAcetam (KEPPRA) 500 MG tablet Take 1,500 mg by mouth 2 (two) times daily.   Yes Historical Provider, MD  lisinopril (PRINIVIL,ZESTRIL) 40 MG tablet Take 1 tablet (40 mg total) by mouth daily. 09/04/13  Yes Rocco Serene, MD  Multiple Vitamin (MULTIVITAMIN WITH MINERALS) TABS tablet Take 1 tablet by mouth daily.   Yes Historical Provider, MD  pantoprazole (PROTONIX) 40 MG tablet Take 1 tablet (40 mg total) by mouth daily. 06/02/13  Yes Vivi Barrack, MD  permethrin (ELIMITE) 5 % cream Apply to affected area once 11/14/13  Yes Kristen N Ward, DO  phenytoin (DILANTIN) 100 MG ER capsule Take 300 mg by mouth every morning.   Yes Historical Provider, MD  potassium chloride (K-DUR,KLOR-CON) 10 MEQ tablet Take 1 tablet (10 mEq total) by mouth daily. 10/14/13  Yes Annett Gula, MD  predniSONE (DELTASONE) 20 MG tablet Take 1.5 tablets (30 mg total) by mouth daily with breakfast. 10/14/13  Yes Annett Gula, MD  ritonavir (NORVIR) 100 MG capsule Take 100 mg by mouth daily with breakfast.   Yes Historical Provider, MD  triamcinolone cream (KENALOG) 0.1 % Apply 1 application topically 4 (four) times daily as needed (itching).    Yes Historical Provider, MD   BP 176/80  Pulse 78  Temp(Src) 98.5 F (36.9 C) (Oral)  Resp 14  Wt 190 lb 1 oz (86.212 kg)  SpO2 100% Physical Exam  Nursing note and vitals reviewed. Constitutional: She is oriented to person, place, and time. She appears well-developed and well-nourished. No distress.  HENT:  Head: Normocephalic and atraumatic.  Mouth/Throat: Oropharynx is clear and  moist.  Eyes: EOM are normal. Pupils are equal, round, and reactive to light.  Neck: Normal range of motion. Neck supple.  Cardiovascular: Normal rate and regular rhythm.   Pulmonary/Chest: Effort normal and breath sounds normal. No respiratory distress. She has no wheezes. She has no rales.  Decreased breath sounds bilateral bases.  Abdominal: Soft. Bowel sounds are normal. She exhibits no distension and no mass. There is no tenderness. There is no rebound and no guarding.  Musculoskeletal: Normal range of motion. She exhibits edema. She exhibits no tenderness.  3+ pitting edema to the patient's knees bilaterally. Lower extremities are weeping.   Neurological: She is alert and oriented to person, place, and time.  Moves all extremities without  deficit. Sensation is grossly intact.  Skin: Skin is warm and dry. No rash noted. No erythema.  Psychiatric: She has a normal mood and affect. Her behavior is normal.    ED Course  Procedures (including critical care time) Labs Review Labs Reviewed  CBC WITH DIFFERENTIAL - Abnormal; Notable for the following:    WBC 3.2 (*)    RBC 1.88 (*)    Hemoglobin 6.7 (*)    HCT 20.3 (*)    MCV 108.0 (*)    MCH 35.6 (*)    Neutrophils Relative % 41 (*)    Neutro Abs 1.3 (*)    Monocytes Relative 17 (*)    All other components within normal limits  COMPREHENSIVE METABOLIC PANEL - Abnormal; Notable for the following:    Chloride 115 (*)    CO2 15 (*)    Glucose, Bld 128 (*)    BUN 35 (*)    Creatinine, Ser 2.08 (*)    Calcium 7.8 (*)    Total Protein 5.6 (*)    Albumin 2.0 (*)    AST 77 (*)    ALT 55 (*)    Alkaline Phosphatase 397 (*)    GFR calc non Af Amer 26 (*)    GFR calc Af Amer 30 (*)    All other components within normal limits  PRO B NATRIURETIC PEPTIDE - Abnormal; Notable for the following:    Pro B Natriuretic peptide (BNP) 794.9 (*)    All other components within normal limits  TROPONIN I  POC OCCULT BLOOD, ED    Imaging  Review Dg Chest 2 View  11/26/2013   CLINICAL DATA:  Leg swelling  EXAM: CHEST  2 VIEW  COMPARISON:  10/23/2013  FINDINGS: Borderline cardiomegaly. No pulmonary edema or effusion. Stable atelectasis and or scarring in the left lung base. Reference chest CT 09/08/2013. No consolidation. Stable aortic contours. No acute osseous findings.  IMPRESSION: Stable exam.  No active cardiopulmonary disease.   Electronically Signed   By: Tiburcio PeaJonathan  Watts M.D.   On: 11/26/2013 02:24     EKG Interpretation None      MDM   Final diagnoses:  Anemia, unspecified anemia type  Hypervolemia, unspecified hypervolemia type    Patient with fluid overload and anemia. Guaiac negative on rectal exam. Discussed with internal medicine teaching service. Will admit patient. She remained stable in the emergency department.    Loren Raceravid Yelverton, MD 11/26/13 [phone]

## 2013-11-26 NOTE — Progress Notes (Signed)
Blood bank called, unable to match antibodies, requested for redraw of type and screen. Spoke with the Dr. About patient not having IV access, no new orders given.

## 2013-11-26 NOTE — Progress Notes (Signed)
UR completed Crystal K. Hutchinson, RN, BSN, MSHL, CCM  11/26/2013 11:47 AM

## 2013-11-26 NOTE — ED Notes (Signed)
EDP notified of negative hemoccult test  and result of low HGB .
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
## 2013-11-26 NOTE — ED Notes (Addendum)
Report given to 3 E unit nurse. Transported in stable condition , denies pain / respirations unlabored .
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
## 2013-11-26 NOTE — ED Notes (Signed)
Personal belongings placed in a plastic bagged with pt. at admission .

## 2013-11-27 ENCOUNTER — Encounter (HOSPITAL_COMMUNITY): Payer: Self-pay | Admitting: General Practice

## 2013-11-27 ENCOUNTER — Inpatient Hospital Stay (HOSPITAL_COMMUNITY): Payer: Medicaid Other

## 2013-11-27 HISTORY — PX: PORTACATH PLACEMENT: SHX2246

## 2013-11-27 LAB — GLUCOSE, CAPILLARY
GLUCOSE-CAPILLARY: 131 mg/dL — AB (ref 70–99)
GLUCOSE-CAPILLARY: 341 mg/dL — AB (ref 70–99)
Glucose-Capillary: 125 mg/dL — ABNORMAL HIGH (ref 70–99)
Glucose-Capillary: 237 mg/dL — ABNORMAL HIGH (ref 70–99)

## 2013-11-27 LAB — CBC
HCT: 18.8 % — ABNORMAL LOW (ref 36.0–46.0)
Hemoglobin: 6.3 g/dL — CL (ref 12.0–15.0)
MCH: 35.6 pg — ABNORMAL HIGH (ref 26.0–34.0)
MCHC: 33.5 g/dL (ref 30.0–36.0)
MCV: 106.2 fL — ABNORMAL HIGH (ref 78.0–100.0)
PLATELETS: 147 10*3/uL — AB (ref 150–400)
RBC: 1.77 MIL/uL — AB (ref 3.87–5.11)
RDW: 13.1 % (ref 11.5–15.5)
WBC: 3.1 10*3/uL — AB (ref 4.0–10.5)

## 2013-11-27 LAB — PROTIME-INR
INR: 0.88 (ref 0.00–1.49)
PROTHROMBIN TIME: 11.9 s (ref 11.6–15.2)

## 2013-11-27 LAB — BASIC METABOLIC PANEL
BUN: 34 mg/dL — ABNORMAL HIGH (ref 6–23)
CO2: 15 mEq/L — ABNORMAL LOW (ref 19–32)
Calcium: 7.6 mg/dL — ABNORMAL LOW (ref 8.4–10.5)
Chloride: 109 mEq/L (ref 96–112)
Creatinine, Ser: 2.14 mg/dL — ABNORMAL HIGH (ref 0.50–1.10)
GFR calc Af Amer: 29 mL/min — ABNORMAL LOW (ref >90)
GFR, EST NON AFRICAN AMERICAN: 25 mL/min — AB (ref >90)
Glucose, Bld: 153 mg/dL — ABNORMAL HIGH (ref 70–99)
POTASSIUM: 4.3 meq/L (ref 3.7–5.3)
SODIUM: 139 meq/L (ref 137–147)

## 2013-11-27 LAB — TECHNOLOGIST SMEAR REVIEW

## 2013-11-27 LAB — SAVE SMEAR

## 2013-11-27 MED ORDER — MIDAZOLAM HCL 2 MG/2ML IJ SOLN
INTRAMUSCULAR | Status: AC
  Administered 2013-11-27: 16:00:00
  Filled 2013-11-27: qty 4

## 2013-11-27 MED ORDER — VANCOMYCIN HCL IN DEXTROSE 1-5 GM/200ML-% IV SOLN
1000.0000 mg | Freq: Once | INTRAVENOUS | Status: AC
  Administered 2013-11-27: 1000 mg via INTRAVENOUS
  Filled 2013-11-27: qty 200

## 2013-11-27 MED ORDER — FENTANYL CITRATE 0.05 MG/ML IJ SOLN
INTRAMUSCULAR | Status: AC | PRN
  Administered 2013-11-27: 25 ug via INTRAVENOUS

## 2013-11-27 MED ORDER — BIOTENE DRY MOUTH MT LIQD
15.0000 mL | Freq: Two times a day (BID) | OROMUCOSAL | Status: DC
  Administered 2013-11-28 – 2013-12-03 (×11): 15 mL via OROMUCOSAL

## 2013-11-27 MED ORDER — MIDAZOLAM HCL 2 MG/2ML IJ SOLN
INTRAMUSCULAR | Status: AC | PRN
Start: 2013-11-27 — End: 2013-11-27
  Administered 2013-11-27: 0.5 mg via INTRAVENOUS

## 2013-11-27 MED ORDER — FENTANYL CITRATE 0.05 MG/ML IJ SOLN
INTRAMUSCULAR | Status: AC
  Administered 2013-11-27: 16:00:00
  Filled 2013-11-27: qty 2

## 2013-11-27 NOTE — H&P (Signed)
Michelle [name] is an 53 y.o. female.   Chief Complaint: Pt admitted 6/29 through ED with B leg edema; shortness of breath and dyspnea. She has known hx of autoimmune hemolytic anemia +HIV Dr Beryle Beams has seen pt and requested consult for [facility] a cath placement and bone marrow biopsy . Need access for medications and transfusion/treatment Dr Barbie Banner has reviewed chart Pt has been seen and examined Scheduled now for [facility] placement today (7/1); and BM bx in am (7/2)  HPI: CVA; HIV; HTN; CHF; Hep C; DM; AIHA; HLD; CKD; nephrotic syndrome; Cirrhosis; Sz  Past Medical History  Diagnosis Date  . Stroke   . Meningitis   . HIV (human immunodeficiency virus infection) 1981  . Hypertension   . Gout   . Muscle spasms of head and/or neck   . CHF (congestive heart failure)     [name] 06/18/2013  . HCV (hepatitis C virus)     chronic/notes 06/18/2013  . Type II diabetes mellitus     [name] 06/18/2013  . AIHA (autoimmune hemolytic anemia)     [name] 06/18/2013  . Hypertensive encephalopathy ~ 05/2013    hospitalaized/notes 06/18/2013  . Exertional shortness of breath     [name] 06/18/2013  . Anxiety     [name] 06/18/2013  . History of syphilis   . High cholesterol   . CKD (chronic kidney disease), stage III   . Nephrotic syndrome   . Cirrhosis   . GERD (gastroesophageal reflux disease)   . Migraine     "all the time" (11/27/2013)  . Seizures     "last sz was week before last" (11/27/2013)    Past Surgical History  Procedure Laterality Date  . Hip pinning Right 1980's  . Av fistula placement Right 07/24/2013    Procedure: RIGHT arm exploration of antecubital space;  Surgeon: Elam Dutch, MD;  Location: [facility] OR;  Service: Vascular;  Laterality: Right;    Family History  Problem Relation Age of Onset  . Cancer - Colon Mother   . Cancer Father   . Hypertension Father   . Diabetes    . Diabetes Sister    Social History:  reports that she quit smoking about 3 years ago. Her smoking use  included Cigarettes. She smoked 0.00 packs per day. She has never used smokeless tobacco. She reports that she drinks alcohol. She reports that she uses illicit drugs (Cocaine, Marijuana, and "Crack" cocaine).  Allergies:  Allergies  Allergen Reactions  . 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 Morphine And Related Hives, Itching and Rash  . Norvasc [Amlodipine Besylate] Hives, Itching and Rash    Medications Prior to Admission  Medication Sig Dispense Refill  . abacavir (ZIAGEN) 300 MG tablet Take 300 mg by mouth 2 (two) times daily.      [name] acyclovir (ZOVIRAX) 800 MG tablet Take 800 mg by mouth daily.       [name] aspirin EC 81 MG tablet Take 81 mg by mouth daily.      . cloNIDine (CATAPRES) 0.3 MG tablet Take 0.3 mg by mouth 3 (three) times daily.      . Darunavir Ethanolate (PREZISTA) 800 MG tablet Take 800 mg by mouth daily with breakfast.      . furosemide (LASIX) 80 MG tablet Take 80 mg by mouth 2 (two) times daily.      . hydrALAZINE (APRESOLINE) 50 MG tablet Take 50 mg by mouth 3 (  three) times daily.      . hydrOXYzine (ATARAX/VISTARIL) 10 MG tablet Take 10 mg by mouth 3 (three) times daily as needed for itching.      . insulin aspart (NOVOLOG) 100 UNIT/ML injection Inject 10-25 Units into the skin 3 (three) times daily before meals. Based on sliding scale      . insulin glargine (LANTUS) 100 UNIT/ML injection Inject 0.3 mLs (30 Units total) into the skin at bedtime.  10 mL  2  . labetalol (NORMODYNE) 200 MG tablet Take 400 mg by mouth 2 (two) times daily.      [name] lamiVUDine (EPIVIR) 150 MG tablet Take 150 mg by mouth daily.      [name] levETIRAcetam (KEPPRA) 500 MG tablet Take 1,500 mg by mouth 2 (two) times daily.      [name] lisinopril (PRINIVIL,ZESTRIL) 40 MG tablet Take 1 tablet (40 mg total) by mouth daily.  90 tablet  3  . Multiple Vitamin (MULTIVITAMIN WITH MINERALS) TABS tablet Take 1 tablet by mouth daily.      .  pantoprazole (PROTONIX) 40 MG tablet Take 1 tablet (40 mg total) by mouth daily.  30 tablet  11  . permethrin (ELIMITE) 5 % cream Apply to affected area once  60 g  1  . phenytoin (DILANTIN) 100 MG ER capsule Take 300 mg by mouth every morning.      . potassium chloride (K-DUR,KLOR-CON) 10 MEQ tablet Take 1 tablet (10 mEq total) by mouth daily.  30 tablet  1  . predniSONE (DELTASONE) 20 MG tablet Take 1.5 tablets (30 mg total) by mouth daily with breakfast.  30 tablet  1  . ritonavir (NORVIR) 100 MG capsule Take 100 mg by mouth daily with breakfast.      . triamcinolone cream (KENALOG) 0.1 % Apply 1 application topically 4 (four) times daily as needed (itching).         Results for orders placed during the hospital encounter of 11/26/13 (from the past 48 hour(s))  CBC WITH DIFFERENTIAL     Status: Abnormal   Collection Time    11/26/13  2:51 AM      Result Value Ref Range   WBC 3.2 (*) 4.0 - 10.5 K/uL   RBC 1.88 (*) 3.87 - 5.11 MIL/uL   Hemoglobin 6.7 (*) 12.0 - 15.0 g/dL   Comment: REPEATED TO VERIFY     CRITICAL RESULT CALLED TO, READ BACK BY AND VERIFIED WITH:     B.SANGALANG,RN 0446 11/26/13 M.CAMPBELL   HCT 20.3 (*) 36.0 - 46.0 %   MCV 108.0 (*) 78.0 - 100.0 fL   MCH 35.6 (*) 26.0 - 34.0 pg   MCHC 33.0  30.0 - 36.0 g/dL   RDW 13.3  11.5 - 15.5 %   Platelets 157  150 - 400 K/uL   Neutrophils Relative % 41 (*) 43 - 77 %   Neutro Abs 1.3 (*) 1.7 - 7.7 K/uL   Lymphocytes Relative 40  12 - 46 %   Lymphs Abs 1.3  0.7 - 4.0 K/uL   Monocytes Relative 17 (*) 3 - 12 %   Monocytes Absolute 0.6  0.1 - 1.0 K/uL   Eosinophils Relative 1  0 - 5 %   Eosinophils Absolute 0.0  0.0 - 0.7 K/uL   Basophils Relative 0  0 - 1 %   Basophils Absolute 0.0  0.0 - 0.1 K/uL  COMPREHENSIVE METABOLIC PANEL     Status: Abnormal   Collection Time  11/26/13  2:51 AM      Result Value Ref Range   Sodium 145  137 - 147 mEq/L   Potassium 4.0  3.7 - 5.3 mEq/L   Chloride 115 (*) 96 - 112 mEq/L   CO2 15  (*) 19 - 32 mEq/L   Glucose, Bld 128 (*) 70 - 99 mg/dL   BUN 35 (*) 6 - 23 mg/dL   Creatinine, Ser 2.08 (*) 0.50 - 1.10 mg/dL   Calcium 7.8 (*) 8.4 - 10.5 mg/dL   Total Protein 5.6 (*) 6.0 - 8.3 g/dL   Albumin 2.0 (*) 3.5 - 5.2 g/dL   AST 77 (*) 0 - 37 U/L   ALT 55 (*) 0 - 35 U/L   Alkaline Phosphatase 397 (*) 39 - 117 U/L   Total Bilirubin 1.0  0.3 - 1.2 mg/dL   GFR calc non Af Amer 26 (*) >90 mL/min   GFR calc Af Amer 30 (*) >90 mL/min   Comment: (NOTE)     The eGFR has been calculated using the CKD EPI equation.     This calculation has not been validated in all clinical situations.     eGFR's persistently <90 mL/min signify possible Chronic Kidney     Disease.  PRO B NATRIURETIC PEPTIDE     Status: Abnormal   Collection Time    11/26/13  2:51 AM      Result Value Ref Range   Pro B Natriuretic peptide (BNP) 794.9 (*) 0 - 125 pg/mL  TROPONIN I     Status: None   Collection Time    11/26/13  2:51 AM      Result Value Ref Range   Troponin I <0.30  <0.30 ng/mL   Comment:            Due to the release kinetics of cTnI,     a negative result within the first hours     of the onset of symptoms does not rule out     myocardial infarction with certainty.     If myocardial infarction is still suspected,     repeat the test at appropriate intervals.  OCCULT BLOOD, POC DEVICE     Status: None   Collection Time    11/26/13  5:26 AM      Result Value Ref Range   Fecal Occult Bld NEGATIVE  NEGATIVE  MRSA PCR SCREENING     Status: Abnormal   Collection Time    11/26/13  7:21 AM      Result Value Ref Range   MRSA by PCR POSITIVE (*) NEGATIVE   Comment:            The GeneXpert MRSA Assay (FDA     approved for NASAL specimens     only), is one component of a     comprehensive MRSA colonization     surveillance program. It is not     intended to diagnose MRSA     infection nor to guide or     monitor treatment for     MRSA infections.     RESULT CALLED TO, READ BACK BY AND  VERIFIED WITH:     J. FUTRELL RN 9:40 11/26/13 (wilsonm)  PREPARE RBC (CROSSMATCH)     Status: None   Collection Time    11/26/13  9:00 AM      Result Value Ref Range   Order Confirmation ORDER PROCESSED BY BLOOD BANK    HAPTOGLOBIN  Status: Abnormal   Collection Time    11/26/13  9:00 AM      Result Value Ref Range   Haptoglobin <25 (*) 45 - 215 mg/dL   Comment: Performed at [facility] DEHYDROGENASE     Status: Abnormal   Collection Time    11/26/13  9:00 AM      Result Value Ref Range   LDH 387 (*) 94 - 250 U/L   Comment: HEMOLYSIS AT THIS LEVEL MAY AFFECT RESULT  RETICULOCYTES     Status: Abnormal   Collection Time    11/26/13  9:00 AM      Result Value Ref Range   Retic Ct Pct 2.7  0.4 - 3.1 %   RBC. 1.97 (*) 3.87 - 5.11 MIL/uL   Retic Count, Manual 53.2  19.0 - 186.0 K/uL  TYPE AND SCREEN     Status: None   Collection Time    11/26/13  9:00 AM      Result Value Ref Range   ABO/RH(D) O POS     Antibody Screen POS     Sample Expiration 11/29/2013     DAT, IgG NEG     PT AG Type       Value: NEGATIVE FOR C ANTIGEN NEGATIVE FOR E ANTIGEN POSITIVE FOR c ANTIGEN POSITIVE FOR e ANTIGEN NEGATIVE FOR KELL ANTIGEN POSITIVE FOR KIDD A ANTIGEN POSITIVE FOR KIDD B ANTIGEN POSITIVE FOR M ANTIGEN NEGATIVE FOR N ANTIGEN POSITIVE FOR S ANTIGEN POSITIVE      FOR s ANTIGEN NEGATIVE FOR DUFFY A ANTIGEN NEGATIVE FOR DUFFY B ANTIGEN NEGATIVE FOR LEWIS A ANTIGEN POSITIVE FOR LEWIS B ANTIGEN  CBC     Status: Abnormal   Collection Time    11/26/13  9:00 AM      Result Value Ref Range   WBC 2.9 (*) 4.0 - 10.5 K/uL   RBC 2.01 (*) 3.87 - 5.11 MIL/uL   Hemoglobin 7.1 (*) 12.0 - 15.0 g/dL   HCT 21.8 (*) 36.0 - 46.0 %   MCV 108.5 (*) 78.0 - 100.0 fL   MCH 35.3 (*) 26.0 - 34.0 pg   MCHC 32.6  30.0 - 36.0 g/dL   RDW 13.4  11.5 - 15.5 %   Platelets 132 (*) 150 - 400 K/uL  CREATININE, SERUM     Status: Abnormal   Collection Time    11/26/13  9:00 AM      Result Value Ref  Range   Creatinine, Ser 1.99 (*) 0.50 - 1.10 mg/dL   GFR calc non Af Amer 27 (*) >90 mL/min   GFR calc Af Amer 32 (*) >90 mL/min   Comment: (NOTE)     The eGFR has been calculated using the CKD EPI equation.     This calculation has not been validated in all clinical situations.     eGFR's persistently <90 mL/min signify possible Chronic Kidney     Disease.  GLUCOSE, CAPILLARY     Status: Abnormal   Collection Time    11/26/13 11:07 AM      Result Value Ref Range   Glucose-Capillary 193 (*) 70 - 99 mg/dL  GLUCOSE, CAPILLARY     Status: Abnormal   Collection Time    11/26/13  4:02 PM      Result Value Ref Range   Glucose-Capillary 158 (*) 70 - 99 mg/dL  TYPE AND SCREEN     Status: None   Collection Time    11/26/13  6:43 PM      Result Value Ref Range   ABO/RH(D) O POS     Antibody Screen POS     Sample Expiration 11/29/2013     Antibody Identification       Value: NO CLINICALLY SIGNIFICANT ANTIBODY IDENTIFIED ANTI I   Unit Number [id number]     Blood Component Type RED CELLS,LR     Unit division 00     Status of Unit ALLOCATED     Transfusion Status OK TO TRANSFUSE     Crossmatch Result COMPATIBLE     Unit Number [id number]     Blood Component Type RED CELLS,LR     Unit division 00     Status of Unit ALLOCATED     Transfusion Status OK TO TRANSFUSE     Crossmatch Result COMPATIBLE    GLUCOSE, CAPILLARY     Status: Abnormal   Collection Time    11/26/13  9:09 PM      Result Value Ref Range   Glucose-Capillary 189 (*) 70 - 99 mg/dL   Comment 1 Notify RN     Comment 2 Documented in Chart    BASIC METABOLIC PANEL     Status: Abnormal   Collection Time    11/27/13  5:00 AM      Result Value Ref Range   Sodium 139  137 - 147 mEq/L   Potassium 4.3  3.7 - 5.3 mEq/L   Chloride 109  96 - 112 mEq/L   CO2 15 (*) 19 - 32 mEq/L   Glucose, Bld 153 (*) 70 - 99 mg/dL   BUN 34 (*) 6 - 23 mg/dL   Creatinine, Ser 2.14 (*) 0.50 - 1.10 mg/dL   Calcium 7.6 (*) 8.4 - 10.5  mg/dL   GFR calc non Af Amer 25 (*) >90 mL/min   GFR calc Af Amer 29 (*) >90 mL/min   Comment: (NOTE)     The eGFR has been calculated using the CKD EPI equation.     This calculation has not been validated in all clinical situations.     eGFR's persistently <90 mL/min signify possible Chronic Kidney     Disease.  CBC     Status: Abnormal   Collection Time    11/27/13  5:00 AM      Result Value Ref Range   WBC 3.1 (*) 4.0 - 10.5 K/uL   RBC 1.77 (*) 3.87 - 5.11 MIL/uL   Hemoglobin 6.3 (*) 12.0 - 15.0 g/dL   Comment: REPEATED TO VERIFY     CRITICAL RESULT CALLED TO, READ BACK BY AND VERIFIED WITH:     Leighton Parody AT 2841 11/27/13 BY ZBEECH.   HCT 18.8 (*) 36.0 - 46.0 %   MCV 106.2 (*) 78.0 - 100.0 fL   MCH 35.6 (*) 26.0 - 34.0 pg   MCHC 33.5  30.0 - 36.0 g/dL   RDW 13.1  11.5 - 15.5 %   Platelets 147 (*) 150 - 400 K/uL  SAVE SMEAR     Status: None   Collection Time    11/27/13  7:01 AM      Result Value Ref Range   Smear Review SMEAR STAINED AND AVAILABLE FOR REVIEW    TECHNOLOGIST SMEAR REVIEW     Status: None   Collection Time    11/27/13  7:01 AM      Result Value Ref Range   Tech Review MORPHOLOGY UNREMARKABLE    GLUCOSE, CAPILLARY  Status: Abnormal   Collection Time    11/27/13  7:36 AM      Result Value Ref Range   Glucose-Capillary 125 (*) 70 - 99 mg/dL  GLUCOSE, CAPILLARY     Status: Abnormal   Collection Time    11/27/13 11:37 AM      Result Value Ref Range   Glucose-Capillary 131 (*) 70 - 99 mg/dL   Comment 1 Documented in Chart     Comment 2 Notify RN     Dg Chest 2 View  11/26/2013   CLINICAL DATA:  Leg swelling  EXAM: CHEST  2 VIEW  COMPARISON:  10/23/2013  FINDINGS: Borderline cardiomegaly. No pulmonary edema or effusion. Stable atelectasis and or scarring in the left lung base. Reference chest CT 09/08/2013. No consolidation. Stable aortic contours. No acute osseous findings.  IMPRESSION: Stable exam.  No active cardiopulmonary disease.    Electronically Signed   By: Jorje Guild M.D.   On: 11/26/2013 02:24    Review of Systems  Constitutional: Positive for malaise/fatigue. Negative for fever.  Respiratory: Positive for shortness of breath.   Cardiovascular: Negative for chest pain.  Gastrointestinal: Negative for nausea, vomiting and abdominal pain.  Musculoskeletal: Negative for neck pain.       Low extr edema  Neurological: Positive for weakness. Negative for dizziness.  Psychiatric/Behavioral: Negative for substance abuse.    Blood pressure 179/88, pulse 80, temperature 97.4 F (36.3 C), temperature source Oral, resp. rate 20, height 5' 5"  (1.651 m), weight 88.633 kg (195 lb 6.4 oz), SpO2 100.00%. Physical Exam  Constitutional: She is oriented to person, place, and time. She appears well-nourished.  Cardiovascular: Normal rate and regular rhythm.   No murmur heard. Respiratory: Effort normal. She has wheezes.  GI: Soft. There is no tenderness.  Musculoskeletal: Normal range of motion. She exhibits edema.  Neurological: She is alert and oriented to person, place, and time.  Skin: Skin is warm and dry.  Psychiatric: She has a normal mood and affect. Her behavior is normal. Judgment and thought content normal.     Assessment/Plan Autoimmune hemolytic anemia Needs access for treatment/transfusion Scheduled for PAC placement 7/1 Also scheduled for BM BX 7/2 Pt aware of procedure benefits and risks and agreeable to proceed Consent signed and in chart Vanco on call for today in IR Check pt/inr Npo since 830 am Did have Hep inj this am  Geoge Lawrance A 11/27/2013, 12:49 PM

## 2013-11-27 NOTE — Progress Notes (Signed)
CRITICAL VALUE ALERT  Critical value received:  Hgb 6.3   Date of notification:  11/27/2013  Time of notification:  0623  Critical value read back:Yes.    Nurse who received alert:  Lawana Chambers  MD notified (1st page):  On-call [facility]  Time of first page:  0624  MD notified (2nd page):  Time of second page:  Responding MD:  On - call [facility] cardiologist  Time MD responded:  [phone]  Patient has no IV access.  Called IV team to try once more to get the IV started on her.  Patient is asymptomatic and resting.

## 2013-11-27 NOTE — Procedures (Signed)
RIJV PAC SVC RA No comp 

## 2013-11-27 NOTE — Progress Notes (Signed)
Wrong time out

## 2013-11-27 NOTE — Progress Notes (Signed)
Nutrition Brief Note  Patient identified on the Malnutrition Screening Tool (MST) Report  Wt Readings from Last 15 Encounters:  11/27/13 195 lb 6.4 oz (88.633 kg)  11/13/13 195 lb (88.451 kg)  11/08/13 195 lb 9.6 oz (88.724 kg)  11/01/13 205 lb 11.2 oz (93.305 kg)  10/25/13 210 lb 6.4 oz (95.437 kg)  10/13/13 209 lb (94.802 kg)  10/04/13 221 lb 14.4 oz (100.653 kg)  09/27/13 210 lb 12.8 oz (95.618 kg)  09/26/13 206 lb (93.441 kg)  09/25/13 204 lb 6.4 oz (92.715 kg)  09/20/13 196 lb (88.905 kg)  09/08/13 171 lb 15.3 oz (78 kg)  09/05/13 175 lb 8 oz (79.606 kg)  08/26/13 182 lb (82.555 kg)  08/23/13 174 lb (78.926 kg)    Body mass index is 32.52 kg/(m^2). Patient meets criteria for Obesity based on current BMI.   Current diet order is Renal/Carb Modified, patient is consuming approximately 100% of meals at this time. Pt denies weight loss stating she usually weighs 190 lbs. She states she was eating too much so, for the past 4 days she has been decreasing her portion sizes. Per chart review pt was given low sodium diet education during previous admission. Pt reports following a low sodium diet at home. RD answered pt's questions about high sodium foods. Reviewed why a low sodium diet is important and reviewed high sodium foods for patient to avoid. Pt has no other questions or concerns at this time.  Labs and medications reviewed.   No nutrition interventions warranted at this time. If nutrition issues arise, please consult RD.   Ian Malkin RD, LDN Inpatient Clinical Dietitian Pager: [phone] After Hours Pager: [phone]

## 2013-11-27 NOTE — Progress Notes (Signed)
Chaplain responded to spiritual consult requesting prayer. Pt was out of room having a procedure. RN said procedure could take awhile and I said I would come back tomorrow. RN said she would let pt know I had come.

## 2013-11-27 NOTE — Progress Notes (Signed)
Subjective: Ms. Nagele had no complaints overnight. The night team was paged re: Hbg 6.3 with a unit available in the blood bank. She currently has no IV access with vein mapping negative bilaterally. She denied any chest pain, SOB, fever.   Objective: Vital signs in last 24 hours: Filed Vitals:   11/26/13 2022 11/27/13 0215 11/27/13 0613 11/27/13 0949  BP: 174/81 101/61 146/75 179/88  Pulse: 80 82 72 80  Temp:  97.9 F (36.6 C) 99.1 F (37.3 C) 97.4 F (36.3 C)  TempSrc:  Oral Oral Oral  Resp: _0 Height:      Weight:   195 lb 6.4 oz (88.633 kg)   SpO2: 99% 100% 100% 100%   Weight change: 2 lb 13.4 oz (1.288 kg)  Intake/Output Summary (Last 24 hours) at 11/27/13 1549 Last data filed at 11/27/13 0851  Gross per 24 hour  Intake    840 ml  Output   2400 ml  Net  -1560 ml   BP 179/88  Pulse 80  Temp(Src) 97.4 F (36.3 C) (Oral)  Resp 20  Ht _1  (1.651 m)  Wt 195 lb 6.4 oz (88.633 kg)  BMI 32.52 kg/m2  SpO2 100%  General Appearance:    Alert, cooperative, no distress, obese  HEENT:    Normocephalic, without obvious abnormality, atraumatic, PERRL  Back:     Symmetric, no curvature, ROM normal, no CVA tenderness  Lungs:     Clear to auscultation bilaterally, respirations unlabored   Heart:    Regular rate and rhythm, S1 and S2 normal, no murmur, rub   or gallop  Genitalia:    Normal female without lesion, discharge or tenderness  Rectal:    Normal tone, normal prostate, no masses or tenderness;   guaiac negative stool  Extremities:   Bilateral 2+ pitting edema up through thighs   Lab Results: CBC    Component Value Date/Time   WBC 3.1* 11/27/2013 0500   WBC 6.0 09/25/2013 1353   RBC 1.77* 11/27/2013 0500   RBC 1.97* 11/26/2013 0900   RBC 2.21* 09/25/2013 1353   HGB 6.3* 11/27/2013 0500   HGB 7.9* 09/25/2013 1353   HCT 18.8* 11/27/2013 0500   HCT 23.9* 09/25/2013 1353   PLT 147* 11/27/2013 0500   PLT 164 09/25/2013 1353   MCV 106.2* 11/27/2013 0500   MCV 108.1*  09/25/2013 1353   MCH 35.6* 11/27/2013 0500   MCH 35.7* 09/25/2013 1353   MCHC 33.5 11/27/2013 0500   MCHC 33.1 09/25/2013 1353   RDW 13.1 11/27/2013 0500   RDW 14.8* 09/25/2013 1353   LYMPHSABS 1.3 11/26/2013 0251   LYMPHSABS 0.6* 09/25/2013 1353   MONOABS 0.6 11/26/2013 0251   MONOABS 0.3 09/25/2013 1353   EOSABS 0.0 11/26/2013 0251   EOSABS 0.0 09/25/2013 1353   BASOSABS 0.0 11/26/2013 0251   BASOSABS 0.0 09/25/2013 1353   PT 11.9 INR 0.88  Peripheral blood smear: no reticulocytes or rouleaux, 1-2 schistocytes, gross leukopenia   Haptoglobin <25 Retic count 53.2 LDH 387  BMET    Component Value Date/Time   NA 139 11/27/2013 0500   K 4.3 11/27/2013 0500   CL 109 11/27/2013 0500   CO2 15* 11/27/2013 0500   GLUCOSE 153* 11/27/2013 0500   BUN 34* 11/27/2013 0500   CREATININE 2.14* 11/27/2013 0500   CREATININE 1.85* 10/04/2013 1440   CALCIUM 7.6* 11/27/2013 0500   GFRNONAA 25* 11/27/2013 0500   GFRNONAA 27* 08/19/2013 1618   GFRAA 29*  11/27/2013 0500   GFRAA 31* 08/19/2013 1618     Micro Results: Recent Results (from the past 240 hour(s))  MRSA PCR SCREENING     Status: Abnormal   Collection Time    11/26/13  7:21 AM      Result Value Ref Range Status   MRSA by PCR POSITIVE (*) NEGATIVE Final   Comment:            The GeneXpert MRSA Assay (FDA     approved for NASAL specimens     only), is one component of a     comprehensive MRSA colonization     surveillance program. It is not     intended to diagnose MRSA     infection nor to guide or     monitor treatment for     MRSA infections.     RESULT CALLED TO, READ BACK BY AND VERIFIED WITH:     J. FUTRELL RN 9:40 11/26/13 ([email or web address])   Studies/Results: Dg Chest 2 View  11/26/2013   CLINICAL DATA:  Leg swelling  EXAM: CHEST  2 VIEW  COMPARISON:  10/23/2013  FINDINGS: Borderline cardiomegaly. No pulmonary edema or effusion. Stable atelectasis and or scarring in the left lung base. Reference chest CT 09/08/2013. No consolidation. Stable aortic contours.  No acute osseous findings.  IMPRESSION: Stable exam.  No active cardiopulmonary disease.   Electronically Signed   By: Jorje Guild M.D.   On: 11/26/2013 02:24   Medications: I have reviewed the patient's current medications. Scheduled Meds: . abacavir  300 mg Oral BID  . acyclovir  800 mg Oral Daily  . [START ON 11/28/2013] antiseptic oral rinse  15 mL Mouth Rinse BID  . aspirin EC  81 mg Oral Daily  . Chlorhexidine Gluconate Cloth  6 each Topical Q0600  . cloNIDine  0.3 mg Oral TID  . Darunavir Ethanolate  800 mg Oral Q breakfast  . furosemide  80 mg Oral BID  . heparin subcutaneous  5,000 Units Subcutaneous 3 times per day  . hydrALAZINE  50 mg Oral TID  . insulin aspart  0-15 Units Subcutaneous TID WC  . insulin glargine  30 Units Subcutaneous QHS  . labetalol  400 mg Oral BID  . lamiVUDine  150 mg Oral Daily  . levETIRAcetam  1,500 mg Oral BID  . lisinopril  40 mg Oral Daily  . multivitamin with minerals  1 tablet Oral Daily  . mupirocin ointment  1 application Nasal BID  . pantoprazole  40 mg Oral Daily  . phenytoin  300 mg Oral Daily  . potassium chloride  10 mEq Oral Daily  . predniSONE  30 mg Oral Q breakfast  . ritonavir  100 mg Oral Q breakfast  . vancomycin  1,000 mg Intravenous Once   Continuous Infusions:  PRN Meds:.hydrOXYzine  Assessment/Plan:  1. Anemia: Hgb remains below baseline ~8 at 6.3, but no active bleeding and patient asymptomatic. Peripheral blood smear non-diagnostic. Ceftriaxone induced hemolytic anemia seems unlikely given time lapse since administration. 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 Also consider intramedullary hemolysis, MDS. -continue to monitor CBC -Port-a-Cath insertion by IR scheduled for today for access -scheduled for bone marrow biopsy in radiology tomorrow at 9 am -maintain active type and screen and consider PRBC transfusion after bone marrow  biopsy or if patient becomes symptomatic -continue prednisone taper pending biopsy results  2. 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 INR wnl and liver enzymes at baseline -cont to monitor   3. HIV: well controlled, last HIV RNA <20 c/ml  -cont home HARRT  4. Congestive Heart Failure: Without exacerbation  -recheck weight and cont daily monitoring  -cont lisinopril, labetalol, hydralazine, clonidine per home regimen  -lasix 80 mg po BID, consider titration once IV access established  5. Chronic Kidney Disease Stage 3: Creatine at 2.1  -cont to monitor   6. Dermatitis of Venous Stasis: No active weeping of fluid or open wounds on exam today, 2+ pitting edema  -compression hose   7. 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 Seizure disorder: no recent seizures  -cont home regimen of Keppa and Dilantin   9. Protein malnutrition: secondary to liver disease, albumin of 2  -consider Nutrition consult   DVT ppx: lovenox SQ  Code FULL  Dispo: Disposition is deferred at this time, awaiting improvement of current medical problems.  Anticipated discharge in approximately 2 day(s).   The patient does have a current PCP Clinton Gallant, MD) and does not need an [facility] hospital follow-up appointment after discharge.  The patient does not know have transportation limitations that hinder transportation to clinic appointments.  .Services Needed at time of discharge: Y = Yes, Blank = No PT:   OT:   RN:   Equipment:   Other:     LOS: 1 day   Kelby Aline, MD 11/27/2013, 3:49 PM

## 2013-11-27 NOTE — Significant Event (Addendum)
Md informed four iv nurses unable to start iv site and Hemaglobin  of 6.3.

## 2013-11-28 ENCOUNTER — Ambulatory Visit (HOSPITAL_COMMUNITY): Payer: Self-pay

## 2013-11-28 ENCOUNTER — Other Ambulatory Visit (HOSPITAL_COMMUNITY): Payer: Self-pay

## 2013-11-28 ENCOUNTER — Inpatient Hospital Stay (HOSPITAL_COMMUNITY): Payer: Medicaid Other

## 2013-11-28 LAB — TYPE AND SCREEN
ABO/RH(D): O POS
Antibody Screen: POSITIVE
DAT, IgG: NEGATIVE

## 2013-11-28 LAB — CBC WITH DIFFERENTIAL/PLATELET
BASOS ABS: 0 10*3/uL (ref 0.0–0.1)
Basophils Relative: 0 % (ref 0–1)
EOS ABS: 0 10*3/uL (ref 0.0–0.7)
Eosinophils Relative: 0 % (ref 0–5)
HEMATOCRIT: 22.4 % — AB (ref 36.0–46.0)
Hemoglobin: 7.5 g/dL — ABNORMAL LOW (ref 12.0–15.0)
LYMPHS PCT: 26 % (ref 12–46)
Lymphs Abs: 1.1 10*3/uL (ref 0.7–4.0)
MCH: 35.4 pg — ABNORMAL HIGH (ref 26.0–34.0)
MCHC: 33.5 g/dL (ref 30.0–36.0)
MCV: 105.7 fL — ABNORMAL HIGH (ref 78.0–100.0)
Monocytes Absolute: 0.3 10*3/uL (ref 0.1–1.0)
Monocytes Relative: 8 % (ref 3–12)
Neutro Abs: 2.8 10*3/uL (ref 1.7–7.7)
Neutrophils Relative %: 67 % (ref 43–77)
Platelets: 211 10*3/uL (ref 150–400)
RBC: 2.12 MIL/uL — ABNORMAL LOW (ref 3.87–5.11)
RDW: 13 % (ref 11.5–15.5)
WBC: 4.1 10*3/uL (ref 4.0–10.5)

## 2013-11-28 LAB — GLUCOSE, CAPILLARY
GLUCOSE-CAPILLARY: 178 mg/dL — AB (ref 70–99)
Glucose-Capillary: 173 mg/dL — ABNORMAL HIGH (ref 70–99)
Glucose-Capillary: 386 mg/dL — ABNORMAL HIGH (ref 70–99)
Glucose-Capillary: 346 mg/dL — ABNORMAL HIGH (ref 70–99)

## 2013-11-28 LAB — BASIC METABOLIC PANEL
ANION GAP: 18 — AB (ref 5–15)
BUN: 39 mg/dL — ABNORMAL HIGH (ref 6–23)
CO2: 12 meq/L — AB (ref 19–32)
CREATININE: 2.31 mg/dL — AB (ref 0.50–1.10)
Calcium: 8 mg/dL — ABNORMAL LOW (ref 8.4–10.5)
Chloride: 108 mEq/L (ref 96–112)
GFR calc Af Amer: 26 mL/min — ABNORMAL LOW (ref >90)
GFR calc non Af Amer: 23 mL/min — ABNORMAL LOW (ref >90)
GLUCOSE: 246 mg/dL — AB (ref 70–99)
Potassium: 4.5 mEq/L (ref 3.7–5.3)
Sodium: 138 mEq/L (ref 137–147)

## 2013-11-28 LAB — BONE MARROW EXAM

## 2013-11-28 MED ORDER — SODIUM CHLORIDE 0.9 % IJ SOLN
10.0000 mL | INTRAMUSCULAR | Status: DC | PRN
  Administered 2013-11-28 – 2013-11-29 (×2): 10 mL
  Administered 2013-11-30: 20 mL
  Administered 2013-12-01: 40 mL
  Administered 2013-12-01 – 2013-12-03 (×4): 10 mL

## 2013-11-28 MED ORDER — FENTANYL CITRATE 0.05 MG/ML IJ SOLN
INTRAMUSCULAR | Status: AC
  Filled 2013-11-28: qty 2

## 2013-11-28 MED ORDER — FENTANYL CITRATE 0.05 MG/ML IJ SOLN
INTRAMUSCULAR | Status: AC | PRN
  Administered 2013-11-28 (×4): 25 ug via INTRAVENOUS

## 2013-11-28 MED ORDER — MIDAZOLAM HCL 2 MG/2ML IJ SOLN
INTRAMUSCULAR | Status: AC | PRN
  Administered 2013-11-28: 1 mg via INTRAVENOUS
  Administered 2013-11-28 (×2): 0.5 mg via INTRAVENOUS

## 2013-11-28 MED ORDER — MIDAZOLAM HCL 2 MG/2ML IJ SOLN
INTRAMUSCULAR | Status: AC
  Filled 2013-11-28: qty 4

## 2013-11-28 NOTE — Progress Notes (Addendum)
Subjective: Ms. Mccullum had no complaints overnight. Her PAC was placed in the RIJ successfully yesterday afternoon and she underwent bone marrow biopsy this am. She is a little sore at the port incision site. She denies any chest pain, SOB, fever, chills, night sweats, difficulty or pain with urination.   Objective: Vital signs in last 24 hours: Filed Vitals:   11/27/13 2142 11/27/13 2210 11/28/13 0056 11/28/13 0419  BP: 116/69 131/73 140/74 145/74  Pulse: 80 83 87 82  Temp:   98.1 F (36.7 C) 97.8 F (36.6 C)  TempSrc:   Oral Oral  Resp:   18 20  Height:      Weight:    193 lb 4.8 oz (87.68 kg)  SpO2:   99% 100%   Weight change: 6.4 oz (0.18 kg)  Intake/Output Summary (Last 24 hours) at 11/28/13 0659 Last data filed at 11/28/13 3419  Gross per 24 hour  Intake    960 ml  Output   1420 ml  Net   -460 ml   BP 145/74  Pulse 82  Temp(Src) 97.8 F (36.6 C) (Oral)  Resp 20  Ht 5' 5"  (1.651 m)  Wt 193 lb 4.8 oz (87.68 kg)  BMI 32.17 kg/m2  SpO2 100%  General Appearance:    Alert, cooperative, no distress, obese  HEENT:    Normocephalic, without obvious abnormality, atraumatic   Back:     Symmetric, no curvature, clean biopsy site on sacrum  Lungs:     Clear to auscultation bilaterally, respirations unlabored   Chest/Heart:    PAC bandage intact and dry. Regular rate and rhythm, S1 and S2 normal, no murmur, rub   or gallop  Extremities:   Bilateral 2+ pitting edema up through thighs but slightly improved from yesterday   Lab Results: CBC    Component Value Date/Time   WBC 4.1 11/28/2013 0430   WBC 6.0 09/25/2013 1353   RBC 2.12* 11/28/2013 0430   RBC 1.97* 11/26/2013 0900   RBC 2.21* 09/25/2013 1353   HGB 7.5* 11/28/2013 0430   HGB 7.9* 09/25/2013 1353   HCT 22.4* 11/28/2013 0430   HCT 23.9* 09/25/2013 1353   PLT 211 11/28/2013 0430   PLT 164 09/25/2013 1353   MCV 105.7* 11/28/2013 0430   MCV 108.1* 09/25/2013 1353   MCH 35.4* 11/28/2013 0430   MCH 35.7* 09/25/2013 1353   MCHC 33.5  11/28/2013 0430   MCHC 33.1 09/25/2013 1353   RDW 13.0 11/28/2013 0430   RDW 14.8* 09/25/2013 1353   LYMPHSABS 1.1 11/28/2013 0430   LYMPHSABS 0.6* 09/25/2013 1353   MONOABS 0.3 11/28/2013 0430   MONOABS 0.3 09/25/2013 1353   EOSABS 0.0 11/28/2013 0430   EOSABS 0.0 09/25/2013 1353   BASOSABS 0.0 11/28/2013 0430   BASOSABS 0.0 09/25/2013 1353     BMET    Component Value Date/Time   NA 138 11/28/2013 0430   K 4.5 11/28/2013 0430   CL 108 11/28/2013 0430   CO2 12* 11/28/2013 0430   GLUCOSE 246* 11/28/2013 0430   BUN 39* 11/28/2013 0430   CREATININE 2.31* 11/28/2013 0430   CREATININE 1.85* 10/04/2013 1440   CALCIUM 8.0* 11/28/2013 0430   GFRNONAA 23* 11/28/2013 0430   GFRNONAA 27* 08/19/2013 1618   GFRAA 26* 11/28/2013 0430   GFRAA 31* 08/19/2013 1618     Micro Results: Recent Results (from the past 240 hour(s))  MRSA PCR SCREENING     Status: Abnormal   Collection Time    11/26/13  7:21 AM      Result Value Ref Range Status   MRSA by PCR POSITIVE (*) NEGATIVE Final   Comment:            The GeneXpert MRSA Assay (FDA     approved for NASAL specimens     only), is one component of a     comprehensive MRSA colonization     surveillance program. It is not     intended to diagnose MRSA     infection nor to guide or     monitor treatment for     MRSA infections.     RESULT CALLED TO, READ BACK BY AND VERIFIED WITH:     J. FUTRELL RN 9:40 11/26/13 ([email or web address])   Studies/Results: No results found. Medications: I have reviewed the patient's current medications. Scheduled Meds: . abacavir  300 mg Oral BID  . acyclovir  800 mg Oral Daily  . antiseptic oral rinse  15 mL Mouth Rinse BID  . aspirin EC  81 mg Oral Daily  . Chlorhexidine Gluconate Cloth  6 each Topical Q0600  . cloNIDine  0.3 mg Oral TID  . Darunavir Ethanolate  800 mg Oral Q breakfast  . furosemide  80 mg Oral BID  . hydrALAZINE  50 mg Oral TID  . insulin aspart  0-15 Units Subcutaneous TID WC  . insulin glargine  30 Units Subcutaneous QHS  .  labetalol  400 mg Oral BID  . lamiVUDine  150 mg Oral Daily  . levETIRAcetam  1,500 mg Oral BID  . lisinopril  40 mg Oral Daily  . multivitamin with minerals  1 tablet Oral Daily  . mupirocin ointment  1 application Nasal BID  . pantoprazole  40 mg Oral Daily  . phenytoin  300 mg Oral Daily  . potassium chloride  10 mEq Oral Daily  . predniSONE  30 mg Oral Q breakfast  . ritonavir  100 mg Oral Q breakfast   Continuous Infusions:  PRN Meds:.hydrOXYzine  Assessment/Plan:  1. Anemia: Hgb has improved to 7.5 from 6.3, but no active bleeding and patient asymptomatic. Awaiting bone marrow biopsy results. Ceftriaxone induced hemolytic anemia seems unlikely given time lapse since administration. 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 Also consider intramedullary hemolysis, MDS. -continue to monitor daily CBC -maintain active type and screen and consider PRBC transfusion if patient becomes symptomatic -continue prednisone taper pending biopsy results  2. 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 INR wnl and liver enzymes at baseline -cont to monitor   3. HIV: well controlled, last HIV RNA <20 c/ml  -cont home HARRT  4. Congestive Heart Failure: Without exacerbation  -recheck weight and cont daily monitoring  -cont lisinopril, labetalol, hydralazine, clonidine per home regimen  -lasix 80 mg po BID, consider titration once IV access established  5. Chronic Kidney Disease Stage 3: Creatine at 2.3  -cont to monitor   6. Dermatitis of Venous Stasis: No active weeping of fluid or open wounds on exam today, 2+ pitting edema  -compression hose   7. Psychotic disorder: Patient hyperactive this morning without current hallucinations, on no psych meds, recent [facility] evaluation   8. Seizure disorder: no recent seizures  -cont home regimen of Keppa and Dilantin    9. Protein malnutrition: secondary to liver disease, albumin of 2  -consider Nutrition consult   DVT ppx: lovenox SQ  Code  FULL  Dispo: Disposition is deferred at this time, awaiting improvement of current medical problems.  Anticipated discharge in approximately 2 day(s).   The patient does have a current PCP Clinton Gallant, MD) and does not need an [facility] hospital follow-up appointment after discharge.  The patient does not know have transportation limitations that hinder transportation to clinic appointments.  .Services Needed at time of discharge: Y = Yes, Blank = No PT:   OT:   RN:   Equipment:   Other:     LOS: 2 days   Kelby Aline, MD 11/28/2013, 6:59 AM Attending physician note:  Patient examined along with the health care team. All pertinent data reviewed. Bone marrow biopsy done this morning. There has been some spontaneous improvement in her CBC with rise in her red cells, white cells, and platelets over the last 24 hours. This suggests a medication effect on bone marrow function. It is not clear if she was taking prednisone as prescribed as an outpatient or if this is why her blood counts have started to improve.. She is on multiple medications. Senior resident reviewed the HIV meds. They can all cause bone marrow suppression at times affecting any of the 3 cell lines. HIV infection itself has been associated with cold immune hemolytic anemia as has syphilis. I asked the blood bank to do additional studies to see if we can elute other potential antigens from the patient's red cells to get a better understanding of the process affecting her blood production pending results of the bone marrow.  Murriel Hopper, MD, [place]  Hematology-Oncology/Internal Medicine

## 2013-11-28 NOTE — Progress Notes (Signed)
Pt requesting information stating that she is a patient here in the hospital for hdu housing.  MD notified of request.

## 2013-11-28 NOTE — Procedures (Signed)
CT guided bone marrow aspirates and biopsy.  No immediate complication. 

## 2013-11-29 DIAGNOSIS — I872 Venous insufficiency (chronic) (peripheral): Secondary | ICD-10-CM

## 2013-11-29 DIAGNOSIS — F29 Unspecified psychosis not due to a substance or known physiological condition: Secondary | ICD-10-CM

## 2013-11-29 DIAGNOSIS — I509 Heart failure, unspecified: Secondary | ICD-10-CM

## 2013-11-29 DIAGNOSIS — D649 Anemia, unspecified: Secondary | ICD-10-CM

## 2013-11-29 DIAGNOSIS — I129 Hypertensive chronic kidney disease with stage 1 through stage 4 chronic kidney disease, or unspecified chronic kidney disease: Secondary | ICD-10-CM

## 2013-11-29 DIAGNOSIS — B20 Human immunodeficiency virus [HIV] disease: Secondary | ICD-10-CM

## 2013-11-29 DIAGNOSIS — N183 Chronic kidney disease, stage 3 unspecified: Secondary | ICD-10-CM

## 2013-11-29 DIAGNOSIS — K7460 Unspecified cirrhosis of liver: Secondary | ICD-10-CM

## 2013-11-29 DIAGNOSIS — N049 Nephrotic syndrome with unspecified morphologic changes: Secondary | ICD-10-CM

## 2013-11-29 DIAGNOSIS — G40909 Epilepsy, unspecified, not intractable, without status epilepticus: Secondary | ICD-10-CM

## 2013-11-29 DIAGNOSIS — E46 Unspecified protein-calorie malnutrition: Secondary | ICD-10-CM

## 2013-11-29 LAB — BASIC METABOLIC PANEL
Anion gap: 15 (ref 5–15)
BUN: 50 mg/dL — AB (ref 6–23)
CALCIUM: 7.8 mg/dL — AB (ref 8.4–10.5)
CHLORIDE: 109 meq/L (ref 96–112)
CO2: 15 meq/L — AB (ref 19–32)
CREATININE: 2.5 mg/dL — AB (ref 0.50–1.10)
GFR calc Af Amer: 24 mL/min — ABNORMAL LOW (ref >90)
GFR calc non Af Amer: 21 mL/min — ABNORMAL LOW (ref >90)
GLUCOSE: 186 mg/dL — AB (ref 70–99)
Potassium: 4.6 mEq/L (ref 3.7–5.3)
Sodium: 139 mEq/L (ref 137–147)

## 2013-11-29 LAB — CBC
HEMATOCRIT: 19.5 % — AB (ref 36.0–46.0)
Hemoglobin: 6.6 g/dL — CL (ref 12.0–15.0)
MCH: 35.9 pg — AB (ref 26.0–34.0)
MCHC: 33.8 g/dL (ref 30.0–36.0)
MCV: 106 fL — AB (ref 78.0–100.0)
PLATELETS: 206 10*3/uL (ref 150–400)
RBC: 1.84 MIL/uL — AB (ref 3.87–5.11)
RDW: 13.1 % (ref 11.5–15.5)
WBC: 5.8 10*3/uL (ref 4.0–10.5)

## 2013-11-29 LAB — GLUCOSE, CAPILLARY
GLUCOSE-CAPILLARY: 188 mg/dL — AB (ref 70–99)
Glucose-Capillary: 295 mg/dL — ABNORMAL HIGH (ref 70–99)
Glucose-Capillary: 354 mg/dL — ABNORMAL HIGH (ref 70–99)
Glucose-Capillary: 192 mg/dL — ABNORMAL HIGH (ref 70–99)

## 2013-11-29 MED ORDER — DIPHENHYDRAMINE HCL 25 MG PO CAPS
25.0000 mg | ORAL_CAPSULE | Freq: Once | ORAL | Status: AC
  Administered 2013-11-29: 25 mg via ORAL
  Filled 2013-11-29: qty 1

## 2013-11-29 NOTE — Progress Notes (Addendum)
Subjective: Michelle [name] had no complaints overnight. She underwent sacral bone marrow biopsy yesterday and is not anxious about waiting for study results. She complains that she is itchy. She denies any chest pain, SOB, fever, chills, night sweats, dizziness or lightheadedness.  Objective: Vital signs in last 24 hours: Filed Vitals:   11/28/13 1700 11/28/13 2126 11/29/13 0559 11/29/13 1021  BP: 159/74 148/68 156/72 198/94  Pulse: 74 88 82 76  Temp: 98 F (36.7 C) 98.3 F (36.8 C) 98.4 F (36.9 C)   TempSrc: Oral Oral Oral   Resp: _0 Height:      Weight:   192 lb 0.7 oz (87.11 kg)   SpO2: 100% 100% 100%    Weight change: -1 lb 4.1 oz (-0.57 kg)  Intake/Output Summary (Last 24 hours) at 11/29/13 1106 Last data filed at 11/29/13 1041  Gross per 24 hour  Intake   1440 ml  Output   1751 ml  Net   -311 ml   BP 198/94  Pulse 76  Temp(Src) 98.4 F (36.9 C) (Oral)  Resp 20  Ht 5' 5" (1.651 m)  Wt 192 lb 0.7 oz (87.11 kg)  BMI 31.96 kg/m2  SpO2 100%  General Appearance:    Alert, cooperative, no distress, obese  HEENT:    Normocephalic, without obvious abnormality, atraumatic   Lungs:     Clear to auscultation bilaterally, respirations unlabored   Chest/Heart:    Bruising surrounds PAC. Regular rate and rhythm, S1 and S2 normal, no murmur, rub or gallop  Extremities:   Bilateral 2+ pitting edema up through thighs which continues to slightly improve   Lab Results: CBC    Component Value Date/Time   WBC 5.8 11/29/2013 0515   WBC 6.0 09/25/2013 1353   RBC 1.84* 11/29/2013 0515   RBC 1.97* 11/26/2013 0900   RBC 2.21* 09/25/2013 1353   HGB 6.6* 11/29/2013 0515   HGB 7.9* 09/25/2013 1353   HCT 19.5* 11/29/2013 0515   HCT 23.9* 09/25/2013 1353   PLT 206 11/29/2013 0515   PLT 164 09/25/2013 1353   MCV 106.0* 11/29/2013 0515   MCV 108.1* 09/25/2013 1353   MCH 35.9* 11/29/2013 0515   MCH 35.7* 09/25/2013 1353   MCHC 33.8 11/29/2013 0515   MCHC 33.1 09/25/2013 1353   RDW 13.1 11/29/2013  0515   RDW 14.8* 09/25/2013 1353   LYMPHSABS 1.1 11/28/2013 0430   LYMPHSABS 0.6* 09/25/2013 1353   MONOABS 0.3 11/28/2013 0430   MONOABS 0.3 09/25/2013 1353   EOSABS 0.0 11/28/2013 0430   EOSABS 0.0 09/25/2013 1353   BASOSABS 0.0 11/28/2013 0430   BASOSABS 0.0 09/25/2013 1353     BMET    Component Value Date/Time   NA 139 11/29/2013 0515   K 4.6 11/29/2013 0515   CL 109 11/29/2013 0515   CO2 15* 11/29/2013 0515   GLUCOSE 186* 11/29/2013 0515   BUN 50* 11/29/2013 0515   CREATININE 2.50* 11/29/2013 0515   CREATININE 1.85* 10/04/2013 1440   CALCIUM 7.8* 11/29/2013 0515   GFRNONAA 21* 11/29/2013 0515   GFRNONAA 27* 08/19/2013 1618   GFRAA 24* 11/29/2013 0515   GFRAA 31* 08/19/2013 1618     Micro Results: Recent Results (from the past 240 hour(s))  MRSA PCR SCREENING     Status: Abnormal   Collection Time    11/26/13  7:21 AM      Result Value Ref Range Status   MRSA by PCR POSITIVE (*) NEGATIVE Final  Comment:            The GeneXpert MRSA Assay (FDA     approved for NASAL specimens     only), is one component of a     comprehensive MRSA colonization     surveillance program. It is not     intended to diagnose MRSA     infection nor to guide or     monitor treatment for     MRSA infections.     RESULT CALLED TO, READ BACK BY AND VERIFIED WITH:     J. FUTRELL RN 9:40 11/26/13 ([email or web address])   Studies/Results:  Preliminary bone marrow biopsy pathology inconclusive. Elution study IgG + agglutination   Ir Fluoro Guide Cv Line Right  11/28/2013   CLINICAL DATA:  HIV.  Anemia.  EXAM: TUNNEL POWER PORT PLACEMENT WITH SUBCUTANEOUS POCKET UTILIZING ULTRASOUND & FLOUROSCOPY. VENA PUNCTURE BY MD.  FLUOROSCOPY TIME:  30 seconds.  MEDICATIONS AND MEDICAL HISTORY: Versed 0.5 mg, Fentanyl 25 mcg.  Vancomycin was given within two hours of incision. Vancomycin was given due to an antibiotic allergy.  ANESTHESIA/SEDATION: Moderate sedation time: 30 minutes  CONTRAST:  None  PROCEDURE: After written informed consent was  obtained, patient was placed in the supine position on angiographic table.  The left arm was prepped and draped in a sterile fashion. Under sonographic guidance, a micropuncture needle was inserted into the left basilic vein and removed over a 018 wire. A 4 French transitional dilator was inserted for IV access.  The right neck and chest was prepped and draped in a sterile fashion. Lidocaine was utilized for local anesthesia. The right internal jugular vein was noted to be patent initially with ultrasound. Under sonographic guidance, a micropuncture needle was inserted into the right IJ vein (Ultrasound and fluoroscopic image documentation was performed). The needle was removed over an 018 wire which was exchanged for a Amplatz. This was advanced into the IVC. An 8-French dilator was advanced over the Amplatz.  A small incision was made in the right upper chest over the anterior right second rib. Utilizing blunt dissection, a subcutaneous pocket was created in the caudal direction. The pocket was irrigated with a copious amount of sterile normal saline. The port catheter was tunneled from the chest incision, and out the neck incision. The reservoir was inserted into the subcutaneous pocket and secured with two 3-0 Ethilon stitches. A peel-away sheath was advanced over the Amplatz wire. The port catheter was cut to measure length and inserted through the peel-away sheath. The peel-away sheath was removed. The chest incision was closed with 3-0 Vicryl interrupted stitches for the subcutaneous tissue and a running of 4-0 Vicryl subcuticular stitch for the skin. The neck incision was closed with a 4-0 Vicryl subcuticular stitch. Derma-bond was applied to both surgical incisions. The port reservoir was flushed and instilled with heparinized saline. No complications.  FINDINGS: A right IJ vein Port-A-Cath is in place with its tip at the cavoatrial junction.  IMPRESSION: Successful vena puncture by MD.  Successful 8 French  right internal jugular vein power port placement with its tip at the SVC/RA junction.   Electronically Signed   By: Maryclare Bean M.D.   On: 11/28/2013 08:58   Ir Venipuncture 37yrs/older By Md  11/28/2013   CLINICAL DATA:  HIV.  Anemia.  EXAM: TUNNEL POWER PORT PLACEMENT WITH SUBCUTANEOUS POCKET UTILIZING ULTRASOUND & FLOUROSCOPY. VENA PUNCTURE BY MD.  FLUOROSCOPY TIME:  30 seconds.  MEDICATIONS AND MEDICAL HISTORY: Versed 0.5 mg,  Fentanyl 25 mcg.  Vancomycin was given within two hours of incision. Vancomycin was given due to an antibiotic allergy.  ANESTHESIA/SEDATION: Moderate sedation time: 30 minutes  CONTRAST:  None  PROCEDURE: After written informed consent was obtained, patient was placed in the supine position on angiographic table.  The left arm was prepped and draped in a sterile fashion. Under sonographic guidance, a micropuncture needle was inserted into the left basilic vein and removed over a 018 wire. A 4 French transitional dilator was inserted for IV access.  The right neck and chest was prepped and draped in a sterile fashion. Lidocaine was utilized for local anesthesia. The right internal jugular vein was noted to be patent initially with ultrasound. Under sonographic guidance, a micropuncture needle was inserted into the right IJ vein (Ultrasound and fluoroscopic image documentation was performed). The needle was removed over an 018 wire which was exchanged for a Amplatz. This was advanced into the IVC. An 8-French dilator was advanced over the Amplatz.  A small incision was made in the right upper chest over the anterior right second rib. Utilizing blunt dissection, a subcutaneous pocket was created in the caudal direction. The pocket was irrigated with a copious amount of sterile normal saline. The port catheter was tunneled from the chest incision, and out the neck incision. The reservoir was inserted into the subcutaneous pocket and secured with two 3-0 Ethilon stitches. A peel-away sheath was  advanced over the Amplatz wire. The port catheter was cut to measure length and inserted through the peel-away sheath. The peel-away sheath was removed. The chest incision was closed with 3-0 Vicryl interrupted stitches for the subcutaneous tissue and a running of 4-0 Vicryl subcuticular stitch for the skin. The neck incision was closed with a 4-0 Vicryl subcuticular stitch. Derma-bond was applied to both surgical incisions. The port reservoir was flushed and instilled with heparinized saline. No complications.  FINDINGS: A right IJ vein Port-A-Cath is in place with its tip at the cavoatrial junction.  IMPRESSION: Successful vena puncture by MD.  Successful 8 French right internal jugular vein power port placement with its tip at the SVC/RA junction.   Electronically Signed   By: Maryclare Bean M.D.   On: 11/28/2013 08:58   Ir US Guide Vasc Access Right  11/28/2013   CLINICAL DATA:  HIV.  Anemia.  EXAM: TUNNEL POWER PORT PLACEMENT WITH SUBCUTANEOUS POCKET UTILIZING ULTRASOUND & FLOUROSCOPY. VENA PUNCTURE BY MD.  FLUOROSCOPY TIME:  30 seconds.  MEDICATIONS AND MEDICAL HISTORY: Versed 0.5 mg, Fentanyl 25 mcg.  Vancomycin was given within two hours of incision. Vancomycin was given due to an antibiotic allergy.  ANESTHESIA/SEDATION: Moderate sedation time: 30 minutes  CONTRAST:  None  PROCEDURE: After written informed consent was obtained, patient was placed in the supine position on angiographic table.  The left arm was prepped and draped in a sterile fashion. Under sonographic guidance, a micropuncture needle was inserted into the left basilic vein and removed over a 018 wire. A 4 French transitional dilator was inserted for IV access.  The right neck and chest was prepped and draped in a sterile fashion. Lidocaine was utilized for local anesthesia. The right internal jugular vein was noted to be patent initially with ultrasound. Under sonographic guidance, a micropuncture needle was inserted into the right IJ vein  (Ultrasound and fluoroscopic image documentation was performed). The needle was removed over an 018 wire which was exchanged for a Amplatz. This was advanced into the IVC. An 8-French dilator was  advanced over the Amplatz.  A small incision was made in the right upper chest over the anterior right second rib. Utilizing blunt dissection, a subcutaneous pocket was created in the caudal direction. The pocket was irrigated with a copious amount of sterile normal saline. The port catheter was tunneled from the chest incision, and out the neck incision. The reservoir was inserted into the subcutaneous pocket and secured with two 3-0 Ethilon stitches. A peel-away sheath was advanced over the Amplatz wire. The port catheter was cut to measure length and inserted through the peel-away sheath. The peel-away sheath was removed. The chest incision was closed with 3-0 Vicryl interrupted stitches for the subcutaneous tissue and a running of 4-0 Vicryl subcuticular stitch for the skin. The neck incision was closed with a 4-0 Vicryl subcuticular stitch. Derma-bond was applied to both surgical incisions. The port reservoir was flushed and instilled with heparinized saline. No complications.  FINDINGS: A right IJ vein Port-A-Cath is in place with its tip at the cavoatrial junction.  IMPRESSION: Successful vena puncture by MD.  Successful 8 French right internal jugular vein power port placement with its tip at the SVC/RA junction.   Electronically Signed   By: Maryclare Bean M.D.   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  EXAM: CT GUIDED BONE MARROW ASPIRATES AND BIOPSY  Physician: Stephan Minister. Anselm Pancoast, MD  MEDICATIONS: 2 mg versed, 100 mcg fentanyl. A radiology nurse monitored the patient for moderate sedation.  ANESTHESIA/SEDATION: Sedation time: 15 min  PROCEDURE: The procedure was explained to the patient. The risks and benefits of the procedure were discussed and the patient's  questions were addressed. Informed consent was obtained from the patient. The patient was placed prone on CT scan. Images of the pelvis were obtained. The left side of back was prepped and draped in sterile fashion. The skin and left posterior iliac bone were anesthetized with 1% lidocaine. 11 gauge bone needle was directed into the left iliac bone with CT guidance. Two aspirates and one core biopsy obtained.  FINDINGS: Needle directed into the posterior left iliac bone.  COMPLICATIONS: None  IMPRESSION: CT guided bone marrow aspirates and core biopsy.   Electronically Signed   By: Markus Daft M.D.   On: 11/28/2013 12:38   Medications: I have reviewed the patient's current medications. Scheduled Meds: . abacavir  300 mg Oral BID  . acyclovir  800 mg Oral Daily  . antiseptic oral rinse  15 mL Mouth Rinse BID  . aspirin EC  81 mg Oral Daily  . Chlorhexidine Gluconate Cloth  6 each Topical Q0600  . cloNIDine  0.3 mg Oral TID  . Darunavir Ethanolate  800 mg Oral Q breakfast  . furosemide  80 mg Oral BID  . hydrALAZINE  50 mg Oral TID  . insulin aspart  0-15 Units Subcutaneous TID WC  . insulin glargine  30 Units Subcutaneous QHS  . labetalol  400 mg Oral BID  . lamiVUDine  150 mg Oral Daily  . levETIRAcetam  1,500 mg Oral BID  . lisinopril  40 mg Oral Daily  . multivitamin with minerals  1 tablet Oral Daily  . mupirocin ointment  1 application Nasal BID  . pantoprazole  40 mg Oral Daily  . phenytoin  300 mg Oral Daily  . potassium chloride  10 mEq Oral Daily  . predniSONE  30 mg Oral Q breakfast  . ritonavir  100 mg Oral  Q breakfast   Continuous Infusions:  PRN Meds:.hydrOXYzine, sodium chloride  Assessment/Plan:  1. Anemia: Hgb down to 6.6 from 7.5 yesterday but no active bleeding and patient asymptomatic. Awaiting final bone marrow biopsy results. 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  Patient on several antiretroviral medications for HIV which can cause suppression of all cell lines. Hydralazine can cause a lupus-like autoimmune response and patient was ANA + in 04/2013 with 1:160 titer. Also consider intramedullary hemolysis, MDS. -continue to monitor daily CBC -consider PRBC transfusion if patient becomes symptomatic -follow-up bone marrow biopsy -[name] antibody test -ANA -continue prednisone taper pending biopsy results  2. Chronic Kidney Disease Stage 3: Creatine increased to 2.5 from 2.3 yesterday. She has known focal segmental glomerulosclerosis and diabetic nephropathy.  -cont to monitor   4. Hypertension: Patient BP has been well controlled during hospitalization. BP of 198/94 noted immediately before medication administration, likely because patient due for medications. Resident spoke with nurse and will recheck. -continue to monitor -continue hydralazine, labetalol, lisinopril and will adjust if BP remains elevated.  5. 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 INR wnl and liver enzymes at baseline -continue to monitor   6. HIV: well controlled, last HIV RNA <20 c/ml  -continue home HARRT  7. Congestive Heart Failure: Without exacerbation  -recheck weight and cont daily monitoring  -cont lisinopril, labetalol, hydralazine, clonidine per home regimen  -lasix 80 mg po BID, consider titration once IV access established  8. Dermatitis of Venous Stasis: 2+ pitting edema continues to slightly improve -compression hose   9. Psychotic disorder: Patient hyperactive this morning without current hallucinations, on no psych meds, recent [facility] evaluation   10. Seizure disorder: no recent seizures  -cont home regimen of Keppa and Dilantin   11. Protein malnutrition: secondary to liver disease, albumin of 2  -consider Nutrition consult   DVT ppx: lovenox SQ  Code FULL  Dispo: Disposition is  deferred at this time, awaiting improvement of current medical problems.  Anticipated discharge in approximately 2 day(s).   The patient does have a current PCP Clinton Gallant, MD) and does not need an [facility] hospital follow-up appointment after discharge.  The patient does not know have transportation limitations that hinder transportation to clinic appointments.  .Services Needed at time of discharge: Y = Yes, Blank = No PT:   OT:   RN:   Equipment:   Other:     LOS: 3 days   Kelby Aline, MD 11/29/2013, 11:06 AM  Attending physician note: Patient interviewed and examined together with medical resident Dr. Lottie Mussel. Status is accurate as recorded above. Preliminary review of the bone marrow aspirate by the pathologist reveals no major abnormalities. Biopsy not out until Monday. I have been in communication with the blood bank to do further testing to delineate the antibodies that she is making against her red cells. There have been random fluctuations in her hemoglobin. We are holding off on blood transfusion until we can clarify the situation further. Despite underlying cardiac disease, she has been asymptomatic at rest.  Murriel Hopper, MD, FACP  Hematology-Oncology/Internal Medicine
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
## 2013-11-29 NOTE — Progress Notes (Signed)
[facility]  Patient Status: Active (receiving services up to time of hospitalization)  [facility] is providing the following services: RN  If patient discharges after hours, please call [phone].   Michelle [name] 11/29/2013, 10:36 AM

## 2013-11-29 NOTE — Progress Notes (Signed)
Pt Hgb this AM was 6.6, asymptomatic, denies nausea and vomiting, denies dizziness, not in any active bleeding, VS to chart. Dr. Senaida Ores was notified and no new order given for now. Will continue to monitor pt

## 2013-11-29 NOTE — Progress Notes (Signed)
UR completed Malay Fantroy K. Sarthak Rubenstein, RN, BSN, MSHL, CCM  11/29/2013 11:38 AM
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
## 2013-11-29 NOTE — Progress Notes (Signed)
CRITICAL VALUE ALERT  Critical value received:  Hemoglobin 6.6  Date of notification:  11/29/13  Time of notification:  0635  Critical value read back:Yes.    Nurse who received alert:  Leanor Kail  MD notified (1st page):  Dr. Senaida Ores  Time of first page:  0636  MD notified (2nd page):  Time of second page:  Responding MD:  Dr. Senaida Ores  Time MD responded:  [phone]
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
## 2013-11-29 NOTE — Progress Notes (Signed)
Inpatient Diabetes Program Recommendations  AACE/ADA: New Consensus Statement on Inpatient Glycemic Control (2013)  Target Ranges:  Prepandial:   less than 140 mg/dL      Peak postprandial:   less than 180 mg/dL (1-2 hours)      Critically ill patients:  140 - 180 mg/dL   Reason for Assessment: Hyperglycemia  Diabetes history: Diabetes-- insulin requiring Outpatient Diabetes medications: Lantus 30 units at HS, Novolog 10 to 25 units tid with meals Current orders for Inpatient glycemic control: Lantus 30 units at HS, Moderate correction tid,  Results for Michelle [name], Michelle [name] (MRN [id number]) as of 11/29/2013 13:46  Ref. Range 11/28/2013 05:50 11/28/2013 11:15 11/28/2013 16:02 11/28/2013 21:32 11/29/2013 06:02 11/29/2013 11:13  Glucose-Capillary Latest Range: 70-99 mg/dL 176 (H) 160 (H) 737 (H) 346 (H) 188 (H) 295 (H)   Note: Patient usually takes meal coverage Novolog at home.  Eating better now.  Request MD consider adding Novolog 3 units tid with meals (to be held if patient eats <50% or CBG is <80 mg/dl) in addition to correction scale and titrate upward according to CBG's  Thank you.  Amelie Caracci S. Elsie Lincoln, RN, CNS, CDE Inpatient Diabetes Program, team pager [phone]

## 2013-11-30 LAB — CBC
HEMATOCRIT: 21.8 % — AB (ref 36.0–46.0)
Hemoglobin: 7.4 g/dL — ABNORMAL LOW (ref 12.0–15.0)
MCH: 35.4 pg — ABNORMAL HIGH (ref 26.0–34.0)
MCHC: 33.9 g/dL (ref 30.0–36.0)
MCV: 104.3 fL — AB (ref 78.0–100.0)
PLATELETS: 234 10*3/uL (ref 150–400)
RBC: 2.09 MIL/uL — ABNORMAL LOW (ref 3.87–5.11)
RDW: 12.8 % (ref 11.5–15.5)
WBC: 6.5 10*3/uL (ref 4.0–10.5)

## 2013-11-30 LAB — TYPE AND SCREEN
ABO/RH(D): O POS
ANTIBODY SCREEN: POSITIVE
UNIT DIVISION: 0
Unit division: 0

## 2013-11-30 LAB — BASIC METABOLIC PANEL
ANION GAP: 15 (ref 5–15)
BUN: 56 mg/dL — AB (ref 6–23)
CHLORIDE: 107 meq/L (ref 96–112)
CO2: 16 mEq/L — ABNORMAL LOW (ref 19–32)
CREATININE: 2.35 mg/dL — AB (ref 0.50–1.10)
Calcium: 8.2 mg/dL — ABNORMAL LOW (ref 8.4–10.5)
GFR calc Af Amer: 26 mL/min — ABNORMAL LOW (ref >90)
GFR, EST NON AFRICAN AMERICAN: 22 mL/min — AB (ref >90)
Glucose, Bld: 158 mg/dL — ABNORMAL HIGH (ref 70–99)
POTASSIUM: 4.7 meq/L (ref 3.7–5.3)
Sodium: 138 mEq/L (ref 137–147)

## 2013-11-30 LAB — GLUCOSE, CAPILLARY
GLUCOSE-CAPILLARY: 379 mg/dL — AB (ref 70–99)
GLUCOSE-CAPILLARY: 284 mg/dL — AB (ref 70–99)
Glucose-Capillary: 134 mg/dL — ABNORMAL HIGH (ref 70–99)
Glucose-Capillary: 219 mg/dL — ABNORMAL HIGH (ref 70–99)

## 2013-11-30 MED ORDER — LAMIVUDINE 150 MG PO TABS: 100.0000 mg | ORAL_TABLET | Freq: Every day | ORAL | Status: DC

## 2013-11-30 MED ORDER — LAMIVUDINE 150 MG PO TABS
150.0000 mg | ORAL_TABLET | Freq: Every day | ORAL | Status: DC
  Administered 2013-11-30 – 2013-12-03 (×4): 150 mg via ORAL
  Filled 2013-11-30 (×4): qty 1

## 2013-11-30 NOTE — Progress Notes (Signed)
Subjective: [name] had no events overnight. She still complains that she is itchy. She underwent sacral bone marrow biopsy 7/2 and is not anxious about waiting for study results. She denies any chest pain, SOB, fever, chills, night sweats, dizziness or lightheadedness.  Objective: Vital signs in last 24 hours: Filed Vitals:   11/29/13 1151 11/29/13 1408 11/29/13 2136 11/30/13 0500  BP: 154/67 132/70 186/83 190/69  Pulse: 74 69 71 68  Temp:  97.6 F (36.4 C) 98 F (36.7 C) 98 F (36.7 C)  TempSrc:  Oral Oral Oral  Resp:  20 20 18   Height:      Weight:    195 lb (88.451 kg)  SpO2:  100% 99% 99%   Weight change: 2 lb 15.3 oz (1.342 kg)  Intake/Output Summary (Last 24 hours) at 11/30/13 0945 Last data filed at 11/30/13 8563  Gross per 24 hour  Intake   1482 ml  Output   4301 ml  Net  -2819 ml   BP 190/69  Pulse 68  Temp(Src) 98 F (36.7 C) (Oral)  Resp 18  Ht 5' 5"  (1.651 m)  Wt 195 lb (88.451 kg)  BMI 32.45 kg/m2  SpO2 99%  General Appearance:    Alert, cooperative, no distress, obese  HEENT:    Normocephalic, without obvious abnormality, atraumatic, moist mucous membranes  Lungs:     Clear to auscultation bilaterally, respirations unlabored   Chest/Heart:    Bruising surrounds PAC. Regular rate and rhythm, S1 and S2 normal, no murmur, rub or gallop  Skin/Extremities:   Bilateral 2+ pitting edema up through thighs which is unchanged. No rashes noted on skin    Lab Results: CBC    Component Value Date/Time   WBC 6.5 11/30/2013 0602   WBC 6.0 09/25/2013 1353   RBC 2.09* 11/30/2013 0602   RBC 1.97* 11/26/2013 0900   RBC 2.21* 09/25/2013 1353   HGB 7.4* 11/30/2013 0602   HGB 7.9* 09/25/2013 1353   HCT 21.8* 11/30/2013 0602   HCT 23.9* 09/25/2013 1353   PLT 234 11/30/2013 0602   PLT 164 09/25/2013 1353   MCV 104.3* 11/30/2013 0602   MCV 108.1* 09/25/2013 1353   MCH 35.4* 11/30/2013 0602   MCH 35.7* 09/25/2013 1353   MCHC 33.9 11/30/2013 0602   MCHC 33.1 09/25/2013 1353   RDW  12.8 11/30/2013 0602   RDW 14.8* 09/25/2013 1353   LYMPHSABS 1.1 11/28/2013 0430   LYMPHSABS 0.6* 09/25/2013 1353   MONOABS 0.3 11/28/2013 0430   MONOABS 0.3 09/25/2013 1353   EOSABS 0.0 11/28/2013 0430   EOSABS 0.0 09/25/2013 1353   BASOSABS 0.0 11/28/2013 0430   BASOSABS 0.0 09/25/2013 1353     BMET    Component Value Date/Time   NA 138 11/30/2013 0602   K 4.7 11/30/2013 0602   CL 107 11/30/2013 0602   CO2 16* 11/30/2013 0602   GLUCOSE 158* 11/30/2013 0602   BUN 56* 11/30/2013 0602   CREATININE 2.35* 11/30/2013 0602   CREATININE 1.85* 10/04/2013 1440   CALCIUM 8.2* 11/30/2013 0602   GFRNONAA 22* 11/30/2013 0602   GFRNONAA 27* 08/19/2013 1618   GFRAA 26* 11/30/2013 0602   GFRAA 31* 08/19/2013 1618   7/3 Donath Landsteiner antibody test pending 7/4 ANA pending   Micro Results: Recent Results (from the past 240 hour(s))  MRSA PCR SCREENING     Status: Abnormal   Collection Time    11/26/13  7:21 AM      Result Value Ref Range  Status   MRSA by PCR POSITIVE (*) NEGATIVE Final   Comment:            The GeneXpert MRSA Assay (FDA     approved for NASAL specimens     only), is one component of a     comprehensive MRSA colonization     surveillance program. It is not     intended to diagnose MRSA     infection nor to guide or     monitor treatment for     MRSA infections.     RESULT CALLED TO, READ BACK BY AND VERIFIED WITH:     J. FUTRELL RN 9:40 11/26/13 ([email or web address])   Studies/Results:  Preliminary bone marrow biopsy pathology inconclusive. Elution study IgG + agglutination   Medications: I have reviewed the patient's current medications. Scheduled Meds: . abacavir  300 mg Oral BID  . acyclovir  800 mg Oral Daily  . antiseptic oral rinse  15 mL Mouth Rinse BID  . aspirin EC  81 mg Oral Daily  . Chlorhexidine Gluconate Cloth  6 each Topical Q0600  . cloNIDine  0.3 mg Oral TID  . Darunavir Ethanolate  800 mg Oral Q breakfast  . furosemide  80 mg Oral BID  . hydrALAZINE  50 mg Oral TID  . insulin  aspart  0-15 Units Subcutaneous TID WC  . insulin glargine  30 Units Subcutaneous QHS  . labetalol  400 mg Oral BID  . lamiVUDine  150 mg Oral Daily  . levETIRAcetam  1,500 mg Oral BID  . lisinopril  40 mg Oral Daily  . multivitamin with minerals  1 tablet Oral Daily  . mupirocin ointment  1 application Nasal BID  . pantoprazole  40 mg Oral Daily  . phenytoin  300 mg Oral Daily  . potassium chloride  10 mEq Oral Daily  . predniSONE  30 mg Oral Q breakfast  . ritonavir  100 mg Oral Q breakfast   Continuous Infusions:  PRN Meds:.hydrOXYzine, sodium chloride  Assessment/Plan:  1. Anemia: Hgb continues to oscillate as it is now up at 7.4 from 6.6 yesterday. She is still asymptomatic with no active bleeding. Awaiting final bone marrow biopsy results. 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 However, she was IgG + on the elution test. Patient on several antiretroviral medications for HIV which can cause suppression of all cell lines. Hydralazine can cause a lupus-like autoimmune response and patient was ANA + in 04/2013 with 1:160 titer. Also consider intramedullary hemolysis, MDS. -continue to monitor daily CBC -consider PRBC transfusion if patient becomes symptomatic -follow-up bone marrow biopsy, Donath Landsteiner antibody test, and ANA -continue prednisone taper pending biopsy results  2. Chronic Kidney Disease Stage 3: Creatine decreased to 2.3 from 2.5 yesterday. She also had >4L output yesterday. She has known focal segmental glomerulosclerosis and diabetic nephropathy.  -cont to monitor  -cont furosemide 80 mg po BID  4. Hypertension: Patient BP was elevated last night in the 180s-190s/60s-80s but has otherwise been well controlled during hospitalization. Likely elevated because she had not yet had her medicatioins. -continue to monitor -continue hydralazine, labetalol, lisinopril and  will adjust if BP remains elevated.  5. 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 INR wnl and liver enzymes at baseline -continue to monitor   6. HIV: well controlled, last HIV RNA <20 c/ml  -continue home HARRT  7.  Congestive Heart Failure: Without exacerbation  -recheck weight and cont daily monitoring  -cont lisinopril, labetalol, hydralazine, clonidine per home regimen  -lasix 80 mg po BID  8. Dermatitis of Venous Stasis: 2+ pitting edema stable -compression hose   9. Psychotic disorder: Patient hyperactive this morning without current hallucinations, on no psych meds, recent [facility] evaluation   10. Seizure disorder: no recent seizures  -cont home regimen of Keppa and Dilantin   11. Protein malnutrition: secondary to liver disease, albumin of 2  -consider Nutrition consult   12. Pruritis Patient complained yesterday and today about diffuse itching. No rashes noted on exam. -cont hydroxyzine prn  DVT ppx: lovenox SQ  Code FULL  Dispo: Disposition is deferred at this time, awaiting improvement of current medical problems.  Anticipated discharge in approximately 3 day(s).   The patient does have a current PCP Clinton Gallant, MD) and does not need an [facility] hospital follow-up appointment after discharge.  The patient does not know have transportation limitations that hinder transportation to clinic appointments.  .Services Needed at time of discharge: Y = Yes, Blank = No PT:   OT:   RN:   Equipment:   Other:     LOS: 4 days   Kelby Aline, MD 11/30/2013, 9:45 AM

## 2013-12-01 LAB — BASIC METABOLIC PANEL
Anion gap: 14 (ref 5–15)
BUN: 60 mg/dL — AB (ref 6–23)
CHLORIDE: 109 meq/L (ref 96–112)
CO2: 17 meq/L — AB (ref 19–32)
Calcium: 8 mg/dL — ABNORMAL LOW (ref 8.4–10.5)
Creatinine, Ser: 2.31 mg/dL — ABNORMAL HIGH (ref 0.50–1.10)
GFR calc Af Amer: 26 mL/min — ABNORMAL LOW (ref >90)
GFR calc non Af Amer: 23 mL/min — ABNORMAL LOW (ref >90)
GLUCOSE: 181 mg/dL — AB (ref 70–99)
POTASSIUM: 4.3 meq/L (ref 3.7–5.3)
SODIUM: 140 meq/L (ref 137–147)

## 2013-12-01 LAB — CBC
HEMATOCRIT: 19 % — AB (ref 36.0–46.0)
Hemoglobin: 6.3 g/dL — CL (ref 12.0–15.0)
MCH: 34.6 pg — ABNORMAL HIGH (ref 26.0–34.0)
MCHC: 33.2 g/dL (ref 30.0–36.0)
MCV: 104.4 fL — ABNORMAL HIGH (ref 78.0–100.0)
Platelets: 206 10*3/uL (ref 150–400)
RBC: 1.82 MIL/uL — AB (ref 3.87–5.11)
RDW: 12.9 % (ref 11.5–15.5)
WBC: 5.1 10*3/uL (ref 4.0–10.5)

## 2013-12-01 LAB — GLUCOSE, CAPILLARY
GLUCOSE-CAPILLARY: 137 mg/dL — AB (ref 70–99)
GLUCOSE-CAPILLARY: 138 mg/dL — AB (ref 70–99)
GLUCOSE-CAPILLARY: 315 mg/dL — AB (ref 70–99)
GLUCOSE-CAPILLARY: 246 mg/dL — AB (ref 70–99)
Glucose-Capillary: 360 mg/dL — ABNORMAL HIGH (ref 70–99)

## 2013-12-01 NOTE — Progress Notes (Signed)
Subjective: Michelle [name] had no events overnight. She thinks her itchiness has improved. She underwent sacral bone marrow biopsy 7/2 and very comfortable staying in the hospital. She denies any chest pain, SOB, fever, chills, night sweats, dizziness or lightheadedness.  Objective: Vital signs in last 24 hours: Filed Vitals:   11/30/13 0500 11/30/13 1412 11/30/13 1931 12/01/13 0351  BP: 190/69 159/72 173/74 191/82  Pulse: 68 73 77 72  Temp: 98 F (36.7 C) 98.2 F (36.8 C) 98 F (36.7 C) 98.2 F (36.8 C)  TempSrc: Oral Oral Oral Oral  Resp: _0 Height:      Weight: 195 lb (88.451 kg)   188 lb 4.4 oz (85.4 kg)  SpO2: 99% 98% 100% 100%   Weight change: -6 lb 11.6 oz (-3.051 kg)  Intake/Output Summary (Last 24 hours) at 12/01/13 1055 Last data filed at 12/01/13 0947  Gross per 24 hour  Intake   1500 ml  Output   3400 ml  Net  -1900 ml   BP 191/82  Pulse 72  Temp(Src) 98.2 F (36.8 C) (Oral)  Resp 18  Ht _1  (1.651 m)  Wt 188 lb 4.4 oz (85.4 kg)  BMI 31.33 kg/m2  SpO2 100%  General Appearance:    Alert, cooperative, no distress, obese  HEENT:    Normocephalic, without obvious abnormality, atraumatic  Lungs:     Clear to auscultation bilaterally, respirations unlabored   Heart:    Regular rate and rhythm, S1 and S2 normal, no murmur, rub or gallop  Skin/Extremities:   Bilateral 2+ pitting edema up through thighs which is unchanged.    Lab Results: CBC    Component Value Date/Time   WBC 5.1 12/01/2013 0536   WBC 6.0 09/25/2013 1353   RBC 1.82* 12/01/2013 0536   RBC 1.97* 11/26/2013 0900   RBC 2.21* 09/25/2013 1353   HGB 6.3* 12/01/2013 0536   HGB 7.9* 09/25/2013 1353   HCT 19.0* 12/01/2013 0536   HCT 23.9* 09/25/2013 1353   PLT 206 12/01/2013 0536   PLT 164 09/25/2013 1353   MCV 104.4* 12/01/2013 0536   MCV 108.1* 09/25/2013 1353   MCH 34.6* 12/01/2013 0536   MCH 35.7* 09/25/2013 1353   MCHC 33.2 12/01/2013 0536   MCHC 33.1 09/25/2013 1353   RDW 12.9 12/01/2013 0536   RDW  14.8* 09/25/2013 1353   LYMPHSABS 1.1 11/28/2013 0430   LYMPHSABS 0.6* 09/25/2013 1353   MONOABS 0.3 11/28/2013 0430   MONOABS 0.3 09/25/2013 1353   EOSABS 0.0 11/28/2013 0430   EOSABS 0.0 09/25/2013 1353   BASOSABS 0.0 11/28/2013 0430   BASOSABS 0.0 09/25/2013 1353     BMET    Component Value Date/Time   NA 140 12/01/2013 0536   K 4.3 12/01/2013 0536   CL 109 12/01/2013 0536   CO2 17* 12/01/2013 0536   GLUCOSE 181* 12/01/2013 0536   BUN 60* 12/01/2013 0536   CREATININE 2.31* 12/01/2013 0536   CREATININE 1.85* 10/04/2013 1440   CALCIUM 8.0* 12/01/2013 0536   GFRNONAA 23* 12/01/2013 0536   GFRNONAA 27* 08/19/2013 1618   GFRAA 26* 12/01/2013 0536   GFRAA 31* 08/19/2013 1618   7/3 Donath Landsteiner antibody test pending 7/4 ANA pending   Micro Results: Recent Results (from the past 240 hour(s))  MRSA PCR SCREENING     Status: Abnormal   Collection Time    11/26/13  7:21 AM      Result Value Ref Range Status   MRSA  by PCR POSITIVE (*) NEGATIVE Final   Comment:            The GeneXpert MRSA Assay (FDA     approved for NASAL specimens     only), is one component of a     comprehensive MRSA colonization     surveillance program. It is not     intended to diagnose MRSA     infection nor to guide or     monitor treatment for     MRSA infections.     RESULT CALLED TO, READ BACK BY AND VERIFIED WITH:     J. FUTRELL RN 9:40 11/26/13 ([email or web address])   Studies/Results:  Preliminary bone marrow biopsy pathology inconclusive. Elution study IgG + agglutination   Medications: I have reviewed the patient's current medications. Scheduled Meds: . abacavir  300 mg Oral BID  . acyclovir  800 mg Oral Daily  . antiseptic oral rinse  15 mL Mouth Rinse BID  . aspirin EC  81 mg Oral Daily  . cloNIDine  0.3 mg Oral TID  . Darunavir Ethanolate  800 mg Oral Q breakfast  . furosemide  80 mg Oral BID  . hydrALAZINE  50 mg Oral TID  . insulin aspart  0-15 Units Subcutaneous TID WC  . insulin glargine  30 Units Subcutaneous  QHS  . labetalol  400 mg Oral BID  . lamiVUDine  150 mg Oral Daily  . levETIRAcetam  1,500 mg Oral BID  . lisinopril  40 mg Oral Daily  . multivitamin with minerals  1 tablet Oral Daily  . pantoprazole  40 mg Oral Daily  . phenytoin  300 mg Oral Daily  . potassium chloride  10 mEq Oral Daily  . predniSONE  30 mg Oral Q breakfast  . ritonavir  100 mg Oral Q breakfast   Continuous Infusions:  PRN Meds:.hydrOXYzine, sodium chloride  Assessment/Plan:  1. Anemia: Hgb continues to oscillate as it is now down to 6.3 from 7.4 yesterday. She is still asymptomatic with no active bleeding. Awaiting final bone marrow biopsy results. 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 However, she was IgG + on the elution test. Patient on several antiretroviral medications for HIV which can cause suppression of all cell lines. Hydralazine can cause a lupus-like autoimmune response and patient was ANA + in 04/2013 with 1:160 titer. Also consider intramedullary hemolysis, MDS. -continue to monitor daily CBC -consider PRBC transfusion if patient becomes symptomatic, do not want to unnecessarily expose patient to additional antigens in blood -follow-up bone marrow biopsy, Donath Landsteiner antibody test, and ANA -continue prednisone taper pending biopsy results  2. Chronic Kidney Disease Stage 3: Creatine stable from yesterday at 2.3. She also had 3.4L output yesterday. She has known focal segmental glomerulosclerosis and diabetic nephropathy.  -cont to monitor  -cont furosemide 80 mg po BID  4. Hypertension: Patient BP continues to oscillate between 150s-190s/70s-80s.  -continue to monitor -continue hydralazine, labetalol, lisinopril and will adjust if BP remains elevated.  5. 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 INR wnl and liver enzymes at  baseline -continue to monitor   6. HIV: well controlled, last HIV RNA <20 c/ml  -continue home HARRT  7. Congestive Heart Failure: Without exacerbation  -recheck weight and cont daily monitoring  -cont lisinopril, labetalol, hydralazine, clonidine per home regimen  -lasix 80 mg po BID  8. Dermatitis of  Venous Stasis: 2+ pitting edema stable -compression hose   9. Psychotic disorder: Patient hyperactive this morning without current hallucinations, on no psych meds, recent [facility] evaluation   10. Seizure disorder: no recent seizures  -cont home regimen of Keppa and Dilantin   11. Protein malnutrition: secondary to liver disease, albumin of 2  -consider Nutrition consult   12. Pruritis Patient complained yesterday and today about diffuse itching. No rashes noted on exam. -cont hydroxyzine prn  DVT ppx: lovenox SQ  Code FULL  Dispo: Disposition is deferred at this time, awaiting improvement of current medical problems.  Anticipated discharge in approximately 3 day(s).   The patient does have a current PCP Clinton Gallant, MD) and does not need an [facility] hospital follow-up appointment after discharge.  The patient does not know have transportation limitations that hinder transportation to clinic appointments.  .Services Needed at time of discharge: Y = Yes, Blank = No PT:   OT:   RN:   Equipment:   Other:     LOS: 5 days   Kelby Aline, MD 12/01/2013, 10:55 AM

## 2013-12-02 DIAGNOSIS — M25519 Pain in unspecified shoulder: Secondary | ICD-10-CM

## 2013-12-02 DIAGNOSIS — L299 Pruritus, unspecified: Secondary | ICD-10-CM

## 2013-12-02 LAB — BASIC METABOLIC PANEL
ANION GAP: 15 (ref 5–15)
BUN: 66 mg/dL — ABNORMAL HIGH (ref 6–23)
CHLORIDE: 104 meq/L (ref 96–112)
CO2: 18 mEq/L — ABNORMAL LOW (ref 19–32)
CREATININE: 2.19 mg/dL — AB (ref 0.50–1.10)
Calcium: 8 mg/dL — ABNORMAL LOW (ref 8.4–10.5)
GFR calc Af Amer: 28 mL/min — ABNORMAL LOW (ref >90)
GFR calc non Af Amer: 24 mL/min — ABNORMAL LOW (ref >90)
Glucose, Bld: 205 mg/dL — ABNORMAL HIGH (ref 70–99)
Potassium: 4 mEq/L (ref 3.7–5.3)
Sodium: 137 mEq/L (ref 137–147)

## 2013-12-02 LAB — ANTI-NUCLEAR AB-TITER (ANA TITER): ANA Titer 1: 1:80 {titer} — ABNORMAL HIGH

## 2013-12-02 LAB — GLUCOSE, CAPILLARY
Glucose-Capillary: 199 mg/dL — ABNORMAL HIGH (ref 70–99)
Glucose-Capillary: 412 mg/dL — ABNORMAL HIGH (ref 70–99)
Glucose-Capillary: 474 mg/dL — ABNORMAL HIGH (ref 70–99)
Glucose-Capillary: 350 mg/dL — ABNORMAL HIGH (ref 70–99)

## 2013-12-02 LAB — RETICULOCYTES
RBC.: 1.98 MIL/uL — ABNORMAL LOW (ref 3.87–5.11)
RETIC CT PCT: 3 % (ref 0.4–3.1)
Retic Count, Absolute: 59.4 10*3/uL (ref 19.0–186.0)

## 2013-12-02 LAB — CBC
HCT: 19.2 % — ABNORMAL LOW (ref 36.0–46.0)
HEMOGLOBIN: 6.6 g/dL — AB (ref 12.0–15.0)
MCH: 35.5 pg — ABNORMAL HIGH (ref 26.0–34.0)
MCHC: 34.4 g/dL (ref 30.0–36.0)
MCV: 103.2 fL — AB (ref 78.0–100.0)
PLATELETS: 191 10*3/uL (ref 150–400)
RBC: 1.86 MIL/uL — ABNORMAL LOW (ref 3.87–5.11)
RDW: 12.8 % (ref 11.5–15.5)
WBC: 6 10*3/uL (ref 4.0–10.5)

## 2013-12-02 LAB — ANA: ANA: POSITIVE — AB

## 2013-12-02 LAB — LACTATE DEHYDROGENASE: LDH: 383 U/L — AB (ref 94–250)

## 2013-12-02 MED ORDER — ACETAMINOPHEN 325 MG PO TABS
650.0000 mg | ORAL_TABLET | Freq: Four times a day (QID) | ORAL | Status: DC | PRN
  Administered 2013-12-02: 650 mg via ORAL
  Filled 2013-12-02: qty 2

## 2013-12-02 NOTE — Progress Notes (Signed)
Inpatient Diabetes Program Recommendations  AACE/ADA: New Consensus Statement on Inpatient Glycemic Control (2013)  Target Ranges:  Prepandial:   less than 140 mg/dL      Peak postprandial:   less than 180 mg/dL (1-2 hours)      Critically ill patients:  140 - 180 mg/dL   Reason for Visit: Hyperglycemia   Results for CATTLEYA, NEVILL (MRN [id number]) as of 12/02/2013 15:00  Ref. Range 12/01/2013 11:20 12/01/2013 16:20 12/01/2013 21:30 12/02/2013 06:28 12/02/2013 11:30  Glucose-Capillary Latest Range: 70-99 mg/dL 937 (H) 342 (H) 876 (H) 199 (H) 412 (H)    Eating 100%.  Consider addition of Novolog 4 units tidwc for meal coverage insulin if pt eats >50% meal.  Will continue to follow. Thank you. Ailene Ards, RD, LDN, CDE Inpatient Diabetes Coordinator [phone]

## 2013-12-02 NOTE — Progress Notes (Addendum)
Subjective: Ms. Isakson had no events overnight. She says her only complaints are itchiness and shoulder pain, which are unchanged.She underwent sacral bone marrow biopsy 7/2 and very comfortable staying in the hospital. She denies any chest pain, SOB, fever, chills, night sweats, dizziness or lightheadedness.  Objective: Vital signs in last 24 hours: Filed Vitals:   12/01/13 0351 12/01/13 1449 12/01/13 2127 12/02/13 0536  BP: 191/82 186/80 182/68 168/91  Pulse: 72 83 86 79  Temp: 98.2 F (36.8 C) 98.3 F (36.8 C) 98 F (36.7 C) 98.2 F (36.8 C)  TempSrc: Oral Oral Oral Oral  Resp: _0 Height:      Weight: 188 lb 4.4 oz (85.4 kg)   184 lb 12.8 oz (83.825 kg)  SpO2: 100% 99% 100% 99%   Weight change: -3 lb 7.6 oz (-1.575 kg)  Intake/Output Summary (Last 24 hours) at 12/02/13 1027 Last data filed at 12/02/13 0952  Gross per 24 hour  Intake   1030 ml  Output   4777 ml  Net  -3747 ml   BP 168/91  Pulse 79  Temp(Src) 98.2 F (36.8 C) (Oral)  Resp 18  Ht _1  (1.651 m)  Wt 184 lb 12.8 oz (83.825 kg)  BMI 30.75 kg/m2  SpO2 99%  General Appearance:    Alert, cooperative, no distress, obese  HEENT:    Normocephalic, without obvious abnormality, atraumatic  Lungs:     Clear to auscultation bilaterally, respirations unlabored   Heart:    Regular rate and rhythm, S1 and S2 normal, no murmur, rub or gallop  Skin/Extremities:   Bilateral 2+ pitting edema up to knees and 1+ on thighs which is slightly improved. L shoulder difficult to abduct without pain    Lab Results: CBC    Component Value Date/Time   WBC 6.0 12/02/2013 0425   WBC 6.0 09/25/2013 1353   RBC 1.86* 12/02/2013 0425   RBC 1.97* 11/26/2013 0900   RBC 2.21* 09/25/2013 1353   HGB 6.6* 12/02/2013 0425   HGB 7.9* 09/25/2013 1353   HCT 19.2* 12/02/2013 0425   HCT 23.9* 09/25/2013 1353   PLT 191 12/02/2013 0425   PLT 164 09/25/2013 1353   MCV 103.2* 12/02/2013 0425   MCV 108.1* 09/25/2013 1353   MCH 35.5* 12/02/2013 0425    MCH 35.7* 09/25/2013 1353   MCHC 34.4 12/02/2013 0425   MCHC 33.1 09/25/2013 1353   RDW 12.8 12/02/2013 0425   RDW 14.8* 09/25/2013 1353   LYMPHSABS 1.1 11/28/2013 0430   LYMPHSABS 0.6* 09/25/2013 1353   MONOABS 0.3 11/28/2013 0430   MONOABS 0.3 09/25/2013 1353   EOSABS 0.0 11/28/2013 0430   EOSABS 0.0 09/25/2013 1353   BASOSABS 0.0 11/28/2013 0430   BASOSABS 0.0 09/25/2013 1353     BMET    Component Value Date/Time   NA 137 12/02/2013 0425   K 4.0 12/02/2013 0425   CL 104 12/02/2013 0425   CO2 18* 12/02/2013 0425   GLUCOSE 205* 12/02/2013 0425   BUN 66* 12/02/2013 0425   CREATININE 2.19* 12/02/2013 0425   CREATININE 1.85* 10/04/2013 1440   CALCIUM 8.0* 12/02/2013 0425   GFRNONAA 24* 12/02/2013 0425   GFRNONAA 27* 08/19/2013 1618   GFRAA 28* 12/02/2013 0425   GFRAA 31* 08/19/2013 1618   7/3 Donath Landsteiner antibody test pending 7/4 ANA pending   Micro Results: Recent Results (from the past 240 hour(s))  MRSA PCR SCREENING     Status: Abnormal   Collection Time  11/26/13  7:21 AM      Result Value Ref Range Status   MRSA by PCR POSITIVE (*) NEGATIVE Final   Comment:            The GeneXpert MRSA Assay (FDA     approved for NASAL specimens     only), is one component of a     comprehensive MRSA colonization     surveillance program. It is not     intended to diagnose MRSA     infection nor to guide or     monitor treatment for     MRSA infections.     RESULT CALLED TO, READ BACK BY AND VERIFIED WITH:     J. FUTRELL RN 9:40 11/26/13 ([email or web address])   Studies/Results:  Preliminary bone marrow biopsy pathology inconclusive. Final report pending Elution study IgG + agglutination   Medications: I have reviewed the patient's current medications. Scheduled Meds: . abacavir  300 mg Oral BID  . acyclovir  800 mg Oral Daily  . antiseptic oral rinse  15 mL Mouth Rinse BID  . aspirin EC  81 mg Oral Daily  . cloNIDine  0.3 mg Oral TID  . Darunavir Ethanolate  800 mg Oral Q breakfast  . furosemide  80 mg  Oral BID  . hydrALAZINE  50 mg Oral TID  . insulin aspart  0-15 Units Subcutaneous TID WC  . insulin glargine  30 Units Subcutaneous QHS  . labetalol  400 mg Oral BID  . lamiVUDine  150 mg Oral Daily  . levETIRAcetam  1,500 mg Oral BID  . lisinopril  40 mg Oral Daily  . multivitamin with minerals  1 tablet Oral Daily  . pantoprazole  40 mg Oral Daily  . phenytoin  300 mg Oral Daily  . potassium chloride  10 mEq Oral Daily  . predniSONE  30 mg Oral Q breakfast  . ritonavir  100 mg Oral Q breakfast   Continuous Infusions:  PRN Meds:.hydrOXYzine, sodium chloride  Assessment/Plan:  1. Anemia: Hgb continues to oscillate as it is now up to 6.6 from 6.3 yesterday. She is still asymptomatic with no active bleeding. Awaiting final bone marrow biopsy results. 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 However, she was IgG + on the elution test. Patient on several antiretroviral medications for HIV which can cause suppression of all cell lines. Hydralazine can cause a lupus-like autoimmune response and patient was ANA + in 04/2013 with 1:160 titer. Also consider intramedullary hemolysis, MDS. -continue to monitor daily CBC -LDH, retic count, haptoglobin in am to trend -consider PRBC transfusion if patient becomes symptomatic, do not want to unnecessarily expose patient to additional antigens in blood -follow-up bone marrow biopsy, Donath Landsteiner antibody test, and ANA -continue prednisone taper pending biopsy results  2. Chronic Kidney Disease Stage 3: Creatine stable from yesterday at 2.3. She also had 3.4L output yesterday. She has known focal segmental glomerulosclerosis and diabetic nephropathy.  -cont to monitor  -cont furosemide 80 mg po BID -24 hr urine total protein and creatinine  4. Hypertension: Patient BP continues to oscillate between 160s-190s/60s-90s.  -continue to  monitor -continue hydralazine, labetalol, lisinopril and will adjust if BP remains elevated.  5. 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 INR wnl and liver enzymes at baseline -continue to monitor   6. HIV: well controlled, last HIV RNA <20 c/ml  -continue  home HARRT  7. Congestive Heart Failure: Without exacerbation  -recheck weight and cont daily monitoring  -cont lisinopril, labetalol, hydralazine, clonidine per home regimen  -lasix 80 mg po BID  8. Dermatitis of Venous Stasis: 2+ pitting edema stable -compression hose   9. Psychotic disorder: Patient remains hyperactive without current hallucinations, on no psych meds, recent [facility] evaluation   10. Seizure disorder: no recent seizures  -cont home regimen of Keppa and Dilantin   11. Protein malnutrition: secondary to liver disease, albumin of 2  -consider Nutrition consult   12. Pruritis Patient continues to complain about diffuse itching.  -cont hydroxyzine prn  14. Shoulder pain: Likely pulled muscle/tendon. Unchanged. Patient is not requesting prn fentanyl -cont to monitor  DVT ppx: lovenox SQ  Code FULL  Dispo: Disposition is deferred at this time, awaiting improvement of current medical problems.  Anticipated discharge in approximately 3 day(s).   The patient does have a current PCP Clinton Gallant, MD) and does need an [facility] hospital follow-up appointment after discharge.  The patient does not know have transportation limitations that hinder transportation to clinic appointments.  .Services Needed at time of discharge: Y = Yes, Blank = No PT:   OT:   RN:   Equipment:   Other:     LOS: 6 days   Kelby Aline, MD 12/02/2013, 10:27 AM Attending physician note: I examined this patient together with medical resident Dr. Lottie Mussel and I concur with his assessment and management plan as outlined above. Although hemoglobin remains low, there is no trend for  further decrease. Hemoglobin running in the 6.5  to 7.5 g range. I would expect that if she was actively hemolyzing, we would see a progressive decrease in her hemoglobin which is not the case. I suspect at this point that a major contributing factor to her anemia is her renal insufficiency. 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 Bone marrow biopsy results should be available later today. If no major findings, I would like to get nephrology input with respect to a trial of an erythropoietin stimulating agent. We will repeat a 24-hour urine for creatinine clearance and total protein at this time. Baseline erythropoietin level is 18. Blood sample will be sent today to reference laboratory to assess for the [name] antibody to rule out [facility] in view of the positive complement Coombs test.  Murriel Hopper, MD, FACP  Hematology-Oncology/Internal Medicine

## 2013-12-02 NOTE — Progress Notes (Signed)
Pt CBG 412, MD notified and ordered to give 20U novolog with lunch and to strict food.  Will carry out MD orders and continue to monitor.

## 2013-12-02 NOTE — Progress Notes (Signed)
Pt requesting snacks at this time. Per MD order pt not to have snacks per CBG being elevated. Pt educated on this. CBG to be checked tonight before bed. Will continue to monitor.
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
## 2013-12-03 DIAGNOSIS — E119 Type 2 diabetes mellitus without complications: Secondary | ICD-10-CM

## 2013-12-03 DIAGNOSIS — E40 Kwashiorkor: Secondary | ICD-10-CM

## 2013-12-03 LAB — BASIC METABOLIC PANEL
Anion gap: 16 — ABNORMAL HIGH (ref 5–15)
BUN: 62 mg/dL — AB (ref 6–23)
CHLORIDE: 105 meq/L (ref 96–112)
CO2: 19 mEq/L (ref 19–32)
Calcium: 8.1 mg/dL — ABNORMAL LOW (ref 8.4–10.5)
Creatinine, Ser: 2.36 mg/dL — ABNORMAL HIGH (ref 0.50–1.10)
GFR calc Af Amer: 26 mL/min — ABNORMAL LOW (ref >90)
GFR calc non Af Amer: 22 mL/min — ABNORMAL LOW (ref >90)
GLUCOSE: 134 mg/dL — AB (ref 70–99)
POTASSIUM: 3.7 meq/L (ref 3.7–5.3)
Sodium: 140 mEq/L (ref 137–147)

## 2013-12-03 LAB — CBC
HEMATOCRIT: 20.8 % — AB (ref 36.0–46.0)
HEMOGLOBIN: 7.2 g/dL — AB (ref 12.0–15.0)
MCH: 35.8 pg — ABNORMAL HIGH (ref 26.0–34.0)
MCHC: 34.6 g/dL (ref 30.0–36.0)
MCV: 103.5 fL — ABNORMAL HIGH (ref 78.0–100.0)
Platelets: 224 10*3/uL (ref 150–400)
RBC: 2.01 MIL/uL — ABNORMAL LOW (ref 3.87–5.11)
RDW: 12.7 % (ref 11.5–15.5)
WBC: 6.3 10*3/uL (ref 4.0–10.5)

## 2013-12-03 LAB — GLUCOSE, CAPILLARY
GLUCOSE-CAPILLARY: 128 mg/dL — AB (ref 70–99)
GLUCOSE-CAPILLARY: 246 mg/dL — AB (ref 70–99)

## 2013-12-03 LAB — HAPTOGLOBIN

## 2013-12-03 MED ORDER — HEPARIN SOD (PORK) LOCK FLUSH 100 UNIT/ML IV SOLN
500.0000 [IU] | INTRAVENOUS | Status: DC | PRN
  Administered 2013-12-03: 500 [IU]
  Filled 2013-12-03: qty 5

## 2013-12-03 MED ORDER — HEPARIN SOD (PORK) LOCK FLUSH 100 UNIT/ML IV SOLN
500.0000 [IU] | INTRAVENOUS | Status: DC
  Filled 2013-12-03: qty 5

## 2013-12-03 MED ORDER — LABETALOL HCL 5 MG/ML IV SOLN
10.0000 mg | Freq: Once | INTRAVENOUS | Status: AC
  Administered 2013-12-03: 10 mg via INTRAVENOUS
  Filled 2013-12-03: qty 4

## 2013-12-03 MED ORDER — HYDRALAZINE HCL 20 MG/ML IJ SOLN: 10.0000 mg | Freq: Once | INTRAMUSCULAR | Status: DC

## 2013-12-03 NOTE — Progress Notes (Signed)
Subjective: Michelle [name] had no events overnight. She had no complaints but did ask for the first time about possible discharge. She denies any chest pain, SOB, fever, chills, night sweats, dizziness or lightheadedness.  Objective: Vital signs in last 24 hours: Filed Vitals:   12/03/13 0647 12/03/13 0658 12/03/13 0746 12/03/13 1017  BP: 178/90   194/70  Pulse: 69  73 68  Temp:    98.4 F (36.9 C)  TempSrc:    Oral  Resp:    18  Height:      Weight:  182 lb (82.555 kg)    SpO2:    97%   Weight change: -2 lb 12.8 oz (-1.27 kg)  Intake/Output Summary (Last 24 hours) at 12/03/13 1459 Last data filed at 12/03/13 1253  Gross per 24 hour  Intake   1080 ml  Output   3850 ml  Net  -2770 ml   BP 194/70  Pulse 68  Temp(Src) 98.4 F (36.9 C) (Oral)  Resp 18  Ht 5' 5"  (1.651 m)  Wt 182 lb (82.555 kg)  BMI 30.29 kg/m2  SpO2 97%  General Appearance:    Alert, cooperative, no distress, obese  HEENT:    Normocephalic, without obvious abnormality, atraumatic  Lungs:     Clear to auscultation bilaterally, respirations unlabored   Heart:    Regular rate and rhythm, S1 and S2 normal, no murmur, rub or gallop  Skin/Extremities:   Bilateral 2+ pitting edema up to knees and 1+ on thighs which is slightly improved. L shoulder difficult to abduct without pain    Lab Results: CBC    Component Value Date/Time   WBC 6.3 12/03/2013 0540   WBC 6.0 09/25/2013 1353   RBC 2.01* 12/03/2013 0540   RBC 1.98* 12/02/2013 1525   RBC 2.21* 09/25/2013 1353   HGB 7.2* 12/03/2013 0540   HGB 7.9* 09/25/2013 1353   HCT 20.8* 12/03/2013 0540   HCT 23.9* 09/25/2013 1353   PLT 224 12/03/2013 0540   PLT 164 09/25/2013 1353   MCV 103.5* 12/03/2013 0540   MCV 108.1* 09/25/2013 1353   MCH 35.8* 12/03/2013 0540   MCH 35.7* 09/25/2013 1353   MCHC 34.6 12/03/2013 0540   MCHC 33.1 09/25/2013 1353   RDW 12.7 12/03/2013 0540   RDW 14.8* 09/25/2013 1353   LYMPHSABS 1.1 11/28/2013 0430   LYMPHSABS 0.6* 09/25/2013 1353   MONOABS 0.3 11/28/2013  0430   MONOABS 0.3 09/25/2013 1353   EOSABS 0.0 11/28/2013 0430   EOSABS 0.0 09/25/2013 1353   BASOSABS 0.0 11/28/2013 0430   BASOSABS 0.0 09/25/2013 1353     BMET    Component Value Date/Time   NA 140 12/03/2013 0540   K 3.7 12/03/2013 0540   CL 105 12/03/2013 0540   CO2 19 12/03/2013 0540   GLUCOSE 134* 12/03/2013 0540   BUN 62* 12/03/2013 0540   CREATININE 2.36* 12/03/2013 0540   CREATININE 1.85* 10/04/2013 1440   CALCIUM 8.1* 12/03/2013 0540   GFRNONAA 22* 12/03/2013 0540   GFRNONAA 27* 08/19/2013 1618   GFRAA 26* 12/03/2013 0540   GFRAA 31* 08/19/2013 1618   LDH 383 Retic 3% Haptoglobin <25  7/3 Donath Landsteiner antibody test negative 7/4 ANA titer 1:80 (was 1:160 6 months ago) 7/2 bone marrow biopsy reported to Dr. Beryle Beams as nondiagnostic  Micro Results: Recent Results (from the past 240 hour(s))  MRSA PCR SCREENING     Status: Abnormal   Collection Time    11/26/13  7:21 AM  Result Value Ref Range Status   MRSA by PCR POSITIVE (*) NEGATIVE Final   Comment:            The GeneXpert MRSA Assay (FDA     approved for NASAL specimens     only), is one component of a     comprehensive MRSA colonization     surveillance program. It is not     intended to diagnose MRSA     infection nor to guide or     monitor treatment for     MRSA infections.     RESULT CALLED TO, READ BACK BY AND VERIFIED WITH:     J. FUTRELL RN 9:40 11/26/13 ([email or web address])   Studies/Results:   Medications: I have reviewed the patient's current medications. Scheduled Meds: . abacavir  300 mg Oral BID  . acyclovir  800 mg Oral Daily  . antiseptic oral rinse  15 mL Mouth Rinse BID  . aspirin EC  81 mg Oral Daily  . cloNIDine  0.3 mg Oral TID  . Darunavir Ethanolate  800 mg Oral Q breakfast  . furosemide  80 mg Oral BID  . hydrALAZINE  50 mg Oral TID  . insulin aspart  0-15 Units Subcutaneous TID WC  . insulin glargine  30 Units Subcutaneous QHS  . labetalol  400 mg Oral BID  . lamiVUDine  150 mg Oral Daily   . levETIRAcetam  1,500 mg Oral BID  . lisinopril  40 mg Oral Daily  . multivitamin with minerals  1 tablet Oral Daily  . pantoprazole  40 mg Oral Daily  . phenytoin  300 mg Oral Daily  . potassium chloride  10 mEq Oral Daily  . predniSONE  30 mg Oral Q breakfast  . ritonavir  100 mg Oral Q breakfast   Continuous Infusions:  PRN Meds:.acetaminophen, hydrOXYzine, sodium chloride  Assessment/Plan:  1. Anemia: Hgb continues to oscillate as it is now up to 7.2 from 6.6 yesterday but is continuing to oscillate in the same range. She is still asymptomatic with no active bleeding. Bone marrow biopsy inconclusive. Awaiting final bone marrow biopsy results. Paroxysmal cold hemoglobinemia unlikely as [name] antibody negative. LDH, retic, and haptoglobin stable over hospitalization which makes hemolysis less likely. This is likely anemia of chronic disease with possible contribution of several antiretroviral medications for HIV which can cause suppression of all cell lines. She would likely benefit from erythropoesis stimulated agent administration. -continue to monitor CBC -suggest aranesp 3000 mcg sq q3week with renal follow-up -consider PRBC transfusion if patient becomes symptomatic, do not want to unnecessarily expose patient to additional antigens in blood -continue prednisone taper as outpatient  2. Chronic Kidney Disease Stage 3: Creatine slightly elevated today at 2.4 from yesterday at 2.3. She also had 3.8L output yesterday. She has known focal segmental glomerulosclerosis and diabetic nephropathy.  -cont to monitor  -cont furosemide 80 mg po BID -24 hr urine total protein and creatinine follow-up as outpatient  4. Hypertension: Patient BP continues to oscillate between 170s-200s/60s-90s. She received 10 mg labetalol from night team when paged about BP 206/98. -continue to monitor -continue hydralazine, labetalol, lisinopril and will adjust if BP remains elevated.  5.  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 INR wnl and liver enzymes at baseline -continue to monitor   6. HIV: well controlled, last HIV RNA <20 c/ml  -continue home HARRT  7. DM2: Patient had CBG >400 yesterday and was given  and made snack restricted. She was last 128. -cont SSI and lantus 30 U qhs -monitor as outpatient 8.Congestive Heart Failure: Without exacerbation  -recheck weight and cont daily monitoring  -cont lisinopril, labetalol, hydralazine, clonidine per home regimen  -lasix 80 mg po BID  9. Dermatitis of Venous Stasis: 2+ pitting edema stable -compression hose   10. Psychotic disorder: Patient remains hyperactive without current hallucinations, on no psych meds, recent [facility] evaluation   11. Seizure disorder: no recent seizures  -cont home regimen of Keppa and Dilantin   12. Protein malnutrition: secondary to liver disease, albumin of 2  -consider Nutrition consult   12. Pruritis Patient continues to complain about diffuse itching.  -cont hydroxyzine prn  14. Shoulder pain: Likely pulled muscle/tendon. Unchanged. Patient is not requesting prn fentanyl -cont to monitor  DVT ppx: lovenox SQ  Code FULL  Dispo: Disposition is deferred at this time, awaiting improvement of current medical problems.  Anticipated discharge in approximately today   The patient does have a current PCP Clinton Gallant, MD) and does need an [facility] hospital follow-up appointment after discharge.  The patient does not know have transportation limitations that hinder transportation to clinic appointments.  .Services Needed at time of discharge: Y = Yes, Blank = No PT:   OT:   RN:   Equipment:   Other:     LOS: 7 days   Kelby Aline, MD 12/03/2013, 2:59 PM

## 2013-12-03 NOTE — Progress Notes (Signed)
Home discharge instructions and d/c med papers discussed with patient. Follow up appointments given, discussed diet and activity. Patient verbalizes understanding of discharge instructions. Reiterated CHF teaching, daily weights, diet and S/S of exacerbation. Patient verbally understands. Patient ride at hospital. Patient taken out of hospital via wheelchair.

## 2013-12-03 NOTE — Progress Notes (Signed)
Pt states she does not know if she has an AV fistula but her arm is restricted. 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 No thrill or bruit noted to right AC where pt noted to have fistula in chart. Pt states she is not on dialysis. MD on call for IMTS notified. Will continue to monitor. Huel Coventry, RN

## 2013-12-03 NOTE — Progress Notes (Signed)
UR completed Insiya Oshea K. Anush Wiedeman, RN, BSN, MSHL, CCM  12/03/2013 2:04 PM
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
## 2013-12-03 NOTE — Progress Notes (Signed)
BP 208/100, pt asymptomatic, no chest pain or headache. Pt educated to stay in bed, MD paged as there are no PRN medicines ordered. MD on call Dr Senaida Ores with new order for hydralazine. Per pharmacy this medicine on back  Order. MD paged and new order for labetalol 10mg  IV. Pt given medicine and BP re-check  178/90, HR remains 65-69.

## 2013-12-04 ENCOUNTER — Other Ambulatory Visit (HOSPITAL_COMMUNITY): Payer: Self-pay | Admitting: Radiology

## 2013-12-04 LAB — MISCELLANEOUS TEST: Miscellaneous Test: NEGATIVE

## 2013-12-04 NOTE — Discharge Summary (Signed)
Attending physician discharge note: I personally interviewed and examined this patient together with resident physician Dr. Farley Ly on the day of discharge. History, physical findings, problem assessment, and management plan accurate as recorded above. Clinical summary: Complicated 53 year old woman with hypertension, chronic congestive heart failure, insulin-dependent diabetes, stage III kidney disease, biopsy proven with components of both HIV and diabetic nephropathy, HIV/AIDS, hepatitis C, treated syphilis, previous herpesvirus infections, and chronic psychiatric issues. Over the last few months since January of this year  she has developed progressive normochromic anemia superimposed on her chronic anemia. Hemoglobin fell from her baseline of 8.5-9.5 g down to 6.7 g recorded on 05/31/2013. Anemia initially felt to be a hemolytic process related to an antibiotic that she received during a December 30 through January 4 admission. She was started on steroids at that time and appeared to have a stabilization with rise in hemoglobin up to 8.8 g by January 28. Hemoglobin fell again to 6.9. With further fall to 6.3 on February 7. Lowest hemoglobin recorded at 5.8 on May 9. Transient fall in white count and platelets with lowest white count 2900 on June 30 with platelet count 132,000. She was readmitted on June 30 with symptomatic dyspnea and fatigue for further evaluation of the anemia. Evaluation up until that point recorded in my attending history and physical note dated June 30. She tested persistently positive for auto antibody with a compliment Coombs reagent. Further evaluation with a concentrated eluate revealed that this was likely a warm IgG antibody of very low titer since the IgG reagent under usual lab conditions tested consistently negative. To rule out the possibility of paroxysmal cold hemoglobinuria, a Wilmer Floor  antibody test was sent to a reference lab and was not  detectable. Secondary to a number of atypical features including a reticulocyte count that was inappropriately low for her degree of anemia, fluctuating LDH levels, inconsistent results with testing for haptoglobin, and compounding history of treated syphilis which may have led to be false positive Coombs test, a bone marrow aspiration and biopsy was done. Findings were nonspecific but did not show any viral inclusions, granulomas, myelodysplastic, or malignant changes. The patient was observed closely after an initial blood transfusion and hemoglobin remained stable in the 6.5 - 7.5 g range without need for further transfusions. The patient has a low positive ANA and it is still possible that this is a immune related process. She is on multiple medications and in my opinion, it is still likely that one of the medications is responsible for the anemia. Given her degree of renal dysfunction, plan at this point is a trial of darbepoetin as an outpatient to see if we can reestablish her baseline hemoglobin. Continue to monitor her medications. Consider stopping hydralazine and substituting with an alternative antihypertensive. Continue steroids for now and attempt to taper again.
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
## 2013-12-06 ENCOUNTER — Telehealth: Payer: Self-pay

## 2013-12-06 ENCOUNTER — Other Ambulatory Visit: Payer: Self-pay

## 2013-12-06 LAB — CHROMOSOME ANALYSIS, BONE MARROW

## 2013-12-06 NOTE — Telephone Encounter (Signed)
Cytogenetics report received from [facility] and forwarded to Dr Rosie Fate

## 2013-12-07 ENCOUNTER — Telehealth: Payer: Self-pay | Admitting: Internal Medicine

## 2013-12-07 NOTE — Telephone Encounter (Signed)
lmonvm advising the pt of her r/s appts with dr Rosie Fate for July/.

## 2013-12-12 ENCOUNTER — Telehealth: Payer: Self-pay | Admitting: *Deleted

## 2013-12-12 NOTE — Telephone Encounter (Signed)
Call from [place], [place] with [facility] - # [phone]  Nurse reports pt does not have several meds in her home.  Lasix, Kcl, dilantin, Hydralazine and creams.  Also stated pt had edema to BLE.  Pt called and reminded she has an appointment tomorrow in clinic with Dr Burtis Junes.  She was asked to bring all her meds  She also stated swelling to legs is not new, they have been like this for awhile. Denies SOB.  Will see tomorrow.

## 2013-12-13 ENCOUNTER — Ambulatory Visit (INDEPENDENT_AMBULATORY_CARE_PROVIDER_SITE_OTHER): Payer: Medicaid Other | Admitting: Internal Medicine

## 2013-12-13 ENCOUNTER — Encounter: Payer: Self-pay | Admitting: Internal Medicine

## 2013-12-13 ENCOUNTER — Ambulatory Visit (HOSPITAL_COMMUNITY)
Admission: RE | Admit: 2013-12-13 | Discharge: 2013-12-13 | Disposition: A | Discharge status: Home / Self Care | Payer: Medicaid Other | Source: Ambulatory Visit | Attending: Internal Medicine | Admitting: Internal Medicine

## 2013-12-13 VITALS — BP 142/73 | HR 76 | Temp 97.3°F | Ht 65.0 in | Wt 211.2 lb

## 2013-12-13 DIAGNOSIS — N183 Chronic kidney disease, stage 3 unspecified: Secondary | ICD-10-CM

## 2013-12-13 DIAGNOSIS — M7989 Other specified soft tissue disorders: Secondary | ICD-10-CM

## 2013-12-13 DIAGNOSIS — N189 Chronic kidney disease, unspecified: Principal | ICD-10-CM

## 2013-12-13 DIAGNOSIS — N039 Chronic nephritic syndrome with unspecified morphologic changes: Secondary | ICD-10-CM

## 2013-12-13 DIAGNOSIS — M79601 Pain in right arm: Secondary | ICD-10-CM

## 2013-12-13 DIAGNOSIS — D631 Anemia in chronic kidney disease: Secondary | ICD-10-CM

## 2013-12-13 DIAGNOSIS — M79609 Pain in unspecified limb: Secondary | ICD-10-CM

## 2013-12-13 DIAGNOSIS — R635 Abnormal weight gain: Secondary | ICD-10-CM

## 2013-12-13 LAB — GLUCOSE, CAPILLARY: Glucose-Capillary: 124 mg/dL — ABNORMAL HIGH (ref 70–99)

## 2013-12-13 MED ORDER — PREDNISONE 10 MG PO TABS: ORAL_TABLET | ORAL | Status: DC

## 2013-12-13 NOTE — Patient Instructions (Addendum)
General Instructions: For your weight gain: the [facility] nurse will give you IV lasix for 7/18 anad 7/19 and then you will follow up in clinic on Monday for some labs.   We will work on you discontinuing your steroids. From 12/14/13  take 20 mg for 3 days and then take 10 mg for 3 days and then we will follow up with you in 1 wk to check your blood levels.   Thank you for bringing your medicines today. This helps [place] keep you safe from mistakes.   Progress Toward Treatment Goals:  No flowsheet data found.  Self Care Goals & Plans:  Self Care Goal 11/13/2013  Manage my medications take my medicines as prescribed; bring my medications to every visit; refill my medications on time  Monitor my health -  Eat healthy foods drink diet soda or water instead of juice or soda; eat more vegetables; eat foods that are low in salt; eat baked foods instead of fried foods; eat fruit for snacks and desserts    No flowsheet data found.   Care Management & Community Referrals:  No flowsheet data found.

## 2013-12-13 NOTE — Assessment & Plan Note (Addendum)
Given recent workup during hospitalization including Bone marrow biopsy less likely AIHA and pt may not be benefiting from steroids at all at this time.  -will taper off steroids: 20 mg x3 days and then 40m for 3 days and then stop -follow up in 1 wk to repeat CBC and check how pt is doing  -pt will need to f/u with renal to being ESA agent (scheduled with Dr. SJoelyn Omson 12/27/13)

## 2013-12-13 NOTE — Progress Notes (Signed)
Subjective:    Patient ID: Michelle [name], female    DOB: 04/05/1961, 53 y.o.   MRN: [id number]  HPI Michelle [name] is a 53 yo woman pmh as listed below presents for HFU.   Pt was recently admitted for anemia where a full workup including BM bx didn't yield conclusive answers. It is thought at this time to be related to her CKD. Since her hospitalization pt has been doing well. Her site of biopsy is healing well and doing well. She denies any pain, gross bleeding, or hematuria/hematochezia.   She has been complaining of right UE swelling and pain in that arm since her discharge. She states she isn't sure she has had her porta cath flushed since her admission. She states she has been out of her medicine for at least 4 days since her cousin wasn't able to pick them up for her. She has had some weight gain but feels her feet have been about the same but actively draining some clear fluid. She does feel that she has gained significant weight in her abdomen and has the regular swelling in her legs bilaterally. No CP, HA, blurry vision, DOE, or SOB above baseline. She denied any recent trauma to her right shoulder or hand. She has not had any numbness/weakness/or ulcerations in the right hand/arm. She denied any fever or chills, nausea, vomiting or diarrhea.  She is happy that her sister has moved in with her and feels they are taking better care for her than her previous niece/cousin.     Past Medical History  Diagnosis Date  . Stroke   . Meningitis   . HIV (human immunodeficiency virus infection) 1981  . Hypertension   . Gout   . Muscle spasms of head and/or neck   . CHF (congestive heart failure)     Michelle [name] 06/18/2013  . HCV (hepatitis C virus)     chronic/notes 06/18/2013  . Type II diabetes mellitus     Michelle [name] 06/18/2013  . AIHA (autoimmune hemolytic anemia)     Michelle [name] 06/18/2013  . Hypertensive encephalopathy ~ 05/2013    hospitalaized/notes 06/18/2013  . Exertional shortness of breath     Michelle [name]  06/18/2013  . Anxiety     Michelle [name] 06/18/2013  . History of syphilis   . High cholesterol   . CKD (chronic kidney disease), stage III   . Nephrotic syndrome   . Cirrhosis   . GERD (gastroesophageal reflux disease)   . Migraine     "all the time" (11/27/2013)  . Seizures     "last sz was week before last" (11/27/2013)   Current Outpatient Prescriptions on File Prior to Visit  Medication Sig Dispense Refill  . abacavir (ZIAGEN) 300 MG tablet Take 300 mg by mouth 2 (two) times daily.      [name] acyclovir (ZOVIRAX) 800 MG tablet Take 800 mg by mouth daily.       [name] aspirin EC 81 MG tablet Take 81 mg by mouth daily.      . cloNIDine (CATAPRES) 0.3 MG tablet Take 0.3 mg by mouth 3 (three) times daily.      . Darunavir Ethanolate (PREZISTA) 800 MG tablet Take 800 mg by mouth daily with breakfast.      . furosemide (LASIX) 80 MG tablet Take 80 mg by mouth 2 (two) times daily.      . hydrALAZINE (APRESOLINE) 50 MG tablet Take 50 mg by mouth 3 (three) times daily.      . hydrOXYzine (  ATARAX/VISTARIL) 10 MG tablet Take 10 mg by mouth 3 (three) times daily as needed for itching.      . insulin aspart (NOVOLOG) 100 UNIT/ML injection Inject 10-25 Units into the skin 3 (three) times daily before meals. Based on sliding scale      . insulin glargine (LANTUS) 100 UNIT/ML injection Inject 0.3 mLs (30 Units total) into the skin at bedtime.  10 mL  2  . labetalol (NORMODYNE) 200 MG tablet Take 400 mg by mouth 2 (two) times daily.      [name]. lamiVUDine (EPIVIR) 150 MG tablet Take 150 mg by mouth daily.      [name]. levETIRAcetam (KEPPRA) 500 MG tablet Take 1,500 mg by mouth 2 (two) times daily.      [name]. lisinopril (PRINIVIL,ZESTRIL) 40 MG tablet Take 1 tablet (40 mg total) by mouth daily.  90 tablet  3  . Multiple Vitamin (MULTIVITAMIN WITH MINERALS) TABS tablet Take 1 tablet by mouth daily.      . pantoprazole (PROTONIX) 40 MG tablet Take 1 tablet (40 mg total) by mouth daily.  30 tablet  11  . permethrin (ELIMITE) 5 % cream  Apply to affected area once  60 g  1  . phenytoin (DILANTIN) 100 MG ER capsule Take 300 mg by mouth every morning.      . potassium chloride (K-DUR,KLOR-CON) 10 MEQ tablet Take 1 tablet (10 mEq total) by mouth daily.  30 tablet  1  . predniSONE (DELTASONE) 20 MG tablet Take 1.5 tablets (30 mg total) by mouth daily with breakfast.  30 tablet  1  . ritonavir (NORVIR) 100 MG capsule Take 100 mg by mouth daily with breakfast.      . triamcinolone cream (KENALOG) 0.1 % Apply 1 application topically 4 (four) times daily as needed (itching).        No current facility-administered medications on file prior to visit.   Social, surgical, family history reviewed with patient and updated in appropriate chart locations.   Review of Systems complete 14 pt ROS perfromed and pertinent listed in HPI     Objective:   Physical Exam Filed Vitals:   12/13/13 1500  BP: 142/73  Pulse: 76  Temp: 97.3 F (36.3 C)   General: sitting in chair, NAD HEENT: PERRL, EOMI, no scleral icterus Cardiac: RRR, no rubs, murmurs or gallops Pulm: clear to auscultation bilaterally, moving normal volumes of air Abd: soft, nontender, distended, BS present Ext: warm and well perfused, bilateral LE edema 2+ to knees bilaterally with some active serosanguinous weeping, no erythema or frank pus or rashes, right UE 4+ pitting edema to elbow, all fingers on right hand swollen, good pulses bilaterally, FROM of right arm and shoulder, 5/5 strength in both upper extremieites Neuro: alert and oriented X3, cranial nerves II-XII grossly intact    Assessment & Plan:  Please see problem oriented charting  Pt discussed with Dr. Rogelia [name]

## 2013-12-13 NOTE — Telephone Encounter (Signed)
Michelle [name] called to f/u on 7/16 call and states she was not aware pt was seen today or aware of new HH orders, she states she does not work weekends and that is probably why she did not know about iv lasix orders. She was updated on visit and continuation of [facility]

## 2013-12-13 NOTE — Progress Notes (Signed)
Right upper extremity venous duplex:  No evidence of DVT or superficial thrombosis.    

## 2013-12-13 NOTE — Assessment & Plan Note (Addendum)
There is great concern for UE DVT in the setting of unilateral swelling of right arm and proximity to location of porta cath site that was recently placed during hospitalization.  -right UE venous doppler -if positive will start patient on eliquis (as pt given medications and comorbid conditions such as cirrhosis wouldn't be ideal warfarin candidate) -pharmacy was consulted Dr. Selena Batten recommended eliquis 5mg  BID x 7 days and then 2.5mg  BID until completed treatment (based on medication interactions and pt CrCl)  Addendum: UE ultrasound was negative for clot. This maybe multifactorial in setting of recent surgery on that extremity for AVF assessment and also recent portacath placement that maybe contributing to some lymphedema. Would reassess at close follow up. Recommend compression wrapping to help relieve symptoms.

## 2013-12-13 NOTE — Assessment & Plan Note (Signed)
Pt will be following up with Dr. Marisue Humble on 12/27/13.

## 2013-12-13 NOTE — Assessment & Plan Note (Addendum)
Wt Readings from Last 3 Encounters:  12/13/13 211 lb 3.2 oz (95.8 kg)  12/03/13 182 lb (82.555 kg)  11/13/13 195 lb (88.451 kg)   Pt has had a significant weight gain since her discharge (of about 18 lbs) and has noticed some increase in her abdominal girth. The patient reports not having medication for a couple of days as well this week which maybe contributing.  -call to [facility] was made and a prescription detailing need for IV Lasix 80 mg BID for 7/18-7/19 and then daily weights to be measured.  -f/u on 12/16/13 for recheck and Bmet for Cr function

## 2013-12-14 ENCOUNTER — Other Ambulatory Visit: Payer: Self-pay

## 2013-12-14 ENCOUNTER — Emergency Department (HOSPITAL_COMMUNITY): Payer: Medicaid Other

## 2013-12-14 ENCOUNTER — Observation Stay (HOSPITAL_BASED_OUTPATIENT_CLINIC_OR_DEPARTMENT_OTHER)
Admission: EM | Admit: 2013-12-14 | Discharge: 2013-12-16 | Disposition: A | Discharge status: Home / Self Care | Payer: Medicaid Other | Source: Home / Self Care | Attending: Emergency Medicine | Admitting: Emergency Medicine

## 2013-12-14 ENCOUNTER — Encounter (HOSPITAL_COMMUNITY): Payer: Self-pay | Admitting: Emergency Medicine

## 2013-12-14 DIAGNOSIS — D649 Anemia, unspecified: Secondary | ICD-10-CM | POA: Diagnosis present

## 2013-12-14 DIAGNOSIS — E119 Type 2 diabetes mellitus without complications: Secondary | ICD-10-CM | POA: Diagnosis present

## 2013-12-14 DIAGNOSIS — B20 Human immunodeficiency virus [HIV] disease: Secondary | ICD-10-CM | POA: Diagnosis present

## 2013-12-14 DIAGNOSIS — N184 Chronic kidney disease, stage 4 (severe): Secondary | ICD-10-CM | POA: Diagnosis present

## 2013-12-14 DIAGNOSIS — I5189 Other ill-defined heart diseases: Secondary | ICD-10-CM | POA: Diagnosis present

## 2013-12-14 DIAGNOSIS — I10 Essential (primary) hypertension: Secondary | ICD-10-CM | POA: Diagnosis present

## 2013-12-14 DIAGNOSIS — N189 Chronic kidney disease, unspecified: Secondary | ICD-10-CM

## 2013-12-14 DIAGNOSIS — N049 Nephrotic syndrome with unspecified morphologic changes: Secondary | ICD-10-CM | POA: Diagnosis present

## 2013-12-14 DIAGNOSIS — I5031 Acute diastolic (congestive) heart failure: Secondary | ICD-10-CM | POA: Insufficient documentation

## 2013-12-14 DIAGNOSIS — R569 Unspecified convulsions: Secondary | ICD-10-CM | POA: Diagnosis present

## 2013-12-14 DIAGNOSIS — I509 Heart failure, unspecified: Secondary | ICD-10-CM

## 2013-12-14 DIAGNOSIS — R600 Localized edema: Secondary | ICD-10-CM | POA: Diagnosis present

## 2013-12-14 LAB — COMPREHENSIVE METABOLIC PANEL
ALK PHOS: 339 U/L — AB (ref 39–117)
ALT: 96 U/L — ABNORMAL HIGH (ref 0–35)
ANION GAP: 17 — AB (ref 5–15)
AST: 71 U/L — ABNORMAL HIGH (ref 0–37)
Albumin: 2 g/dL — ABNORMAL LOW (ref 3.5–5.2)
BILIRUBIN TOTAL: 1.4 mg/dL — AB (ref 0.3–1.2)
BUN: 47 mg/dL — AB (ref 6–23)
CHLORIDE: 107 meq/L (ref 96–112)
CO2: 17 meq/L — AB (ref 19–32)
Calcium: 7.6 mg/dL — ABNORMAL LOW (ref 8.4–10.5)
Creatinine, Ser: 2.12 mg/dL — ABNORMAL HIGH (ref 0.50–1.10)
GFR, EST AFRICAN AMERICAN: 29 mL/min — AB (ref >90)
GFR, EST NON AFRICAN AMERICAN: 25 mL/min — AB (ref >90)
Glucose, Bld: 69 mg/dL — ABNORMAL LOW (ref 70–99)
Potassium: 3.7 mEq/L (ref 3.7–5.3)
Sodium: 141 mEq/L (ref 137–147)
Total Protein: 5.2 g/dL — ABNORMAL LOW (ref 6.0–8.3)

## 2013-12-14 LAB — CBC WITH DIFFERENTIAL/PLATELET
Basophils Absolute: 0 10*3/uL (ref 0.0–0.1)
Basophils Relative: 0 % (ref 0–1)
EOS PCT: 1 % (ref 0–5)
Eosinophils Absolute: 0 10*3/uL (ref 0.0–0.7)
HCT: 22.1 % — ABNORMAL LOW (ref 36.0–46.0)
Hemoglobin: 7.4 g/dL — ABNORMAL LOW (ref 12.0–15.0)
Lymphocytes Relative: 36 % (ref 12–46)
Lymphs Abs: 1.4 10*3/uL (ref 0.7–4.0)
MCH: 35.7 pg — ABNORMAL HIGH (ref 26.0–34.0)
MCHC: 33.5 g/dL (ref 30.0–36.0)
MCV: 106.8 fL — ABNORMAL HIGH (ref 78.0–100.0)
Monocytes Absolute: 0.7 10*3/uL (ref 0.1–1.0)
Monocytes Relative: 16 % — ABNORMAL HIGH (ref 3–12)
Neutro Abs: 1.9 10*3/uL (ref 1.7–7.7)
Neutrophils Relative %: 47 % (ref 43–77)
PLATELETS: 158 10*3/uL (ref 150–400)
RBC: 2.07 MIL/uL — AB (ref 3.87–5.11)
RDW: 13.4 % (ref 11.5–15.5)
WBC: 4 10*3/uL (ref 4.0–10.5)

## 2013-12-14 LAB — CBG MONITORING, ED: Glucose-Capillary: 48 mg/dL — ABNORMAL LOW (ref 70–99)

## 2013-12-14 LAB — I-STAT TROPONIN, ED: TROPONIN I, POC: 0.02 ng/mL (ref 0.00–0.08)

## 2013-12-14 LAB — PRO B NATRIURETIC PEPTIDE: Pro B Natriuretic peptide (BNP): 1083 pg/mL — ABNORMAL HIGH (ref 0–125)

## 2013-12-14 MED ORDER — ADULT MULTIVITAMIN W/MINERALS CH
1.0000 | ORAL_TABLET | Freq: Every day | ORAL | Status: DC
  Administered 2013-12-15 – 2013-12-16 (×2): 1 via ORAL
  Filled 2013-12-14 (×2): qty 1

## 2013-12-14 MED ORDER — PREDNISONE 20 MG PO TABS
20.0000 mg | ORAL_TABLET | Freq: Every day | ORAL | Status: DC
  Administered 2013-12-15 – 2013-12-16 (×2): 20 mg via ORAL
  Filled 2013-12-14 (×3): qty 1

## 2013-12-14 MED ORDER — FUROSEMIDE 10 MG/ML IJ SOLN
80.0000 mg | Freq: Once | INTRAMUSCULAR | Status: AC
  Administered 2013-12-15: 80 mg via INTRAVENOUS
  Filled 2013-12-14: qty 8

## 2013-12-14 MED ORDER — LABETALOL HCL 200 MG PO TABS
400.0000 mg | ORAL_TABLET | Freq: Two times a day (BID) | ORAL | Status: DC
Start: 2013-12-15 — End: 2013-12-16
  Administered 2013-12-15 – 2013-12-16 (×4): 400 mg via ORAL
  Filled 2013-12-14 (×5): qty 2

## 2013-12-14 MED ORDER — HYDROXYZINE HCL 10 MG PO TABS
10.0000 mg | ORAL_TABLET | Freq: Three times a day (TID) | ORAL | Status: DC | PRN
  Administered 2013-12-15 – 2013-12-16 (×2): 10 mg via ORAL
  Filled 2013-12-14 (×3): qty 1

## 2013-12-14 MED ORDER — PHENYTOIN SODIUM EXTENDED 100 MG PO CAPS
300.0000 mg | ORAL_CAPSULE | Freq: Every day | ORAL | Status: DC
  Administered 2013-12-15 – 2013-12-16 (×2): 300 mg via ORAL
  Filled 2013-12-14 (×2): qty 3

## 2013-12-14 MED ORDER — CLONIDINE HCL 0.3 MG PO TABS
0.3000 mg | ORAL_TABLET | Freq: Three times a day (TID) | ORAL | Status: DC
  Administered 2013-12-15 – 2013-12-16 (×5): 0.3 mg via ORAL
  Filled 2013-12-14 (×7): qty 1

## 2013-12-14 MED ORDER — ABACAVIR SULFATE 300 MG PO TABS
300.0000 mg | ORAL_TABLET | Freq: Two times a day (BID) | ORAL | Status: DC
Start: 2013-12-15 — End: 2013-12-16
  Administered 2013-12-15 – 2013-12-16 (×4): 300 mg via ORAL
  Filled 2013-12-14 (×5): qty 1

## 2013-12-14 MED ORDER — LEVETIRACETAM 750 MG PO TABS
1500.0000 mg | ORAL_TABLET | Freq: Two times a day (BID) | ORAL | Status: DC
  Administered 2013-12-15 – 2013-12-16 (×4): 1500 mg via ORAL
  Filled 2013-12-14 (×5): qty 2

## 2013-12-14 MED ORDER — RITONAVIR 100 MG PO CAPS
100.0000 mg | ORAL_CAPSULE | Freq: Every day | ORAL | Status: DC
  Administered 2013-12-15 – 2013-12-16 (×2): 100 mg via ORAL
  Filled 2013-12-14 (×3): qty 1

## 2013-12-14 MED ORDER — ASPIRIN EC 81 MG PO TBEC
81.0000 mg | DELAYED_RELEASE_TABLET | Freq: Every day | ORAL | Status: DC
  Administered 2013-12-15 – 2013-12-16 (×2): 81 mg via ORAL
  Filled 2013-12-14 (×2): qty 1

## 2013-12-14 MED ORDER — ACYCLOVIR 800 MG PO TABS
800.0000 mg | ORAL_TABLET | Freq: Every day | ORAL | Status: DC
  Administered 2013-12-15 – 2013-12-16 (×2): 800 mg via ORAL
  Filled 2013-12-14 (×2): qty 1

## 2013-12-14 MED ORDER — LISINOPRIL 40 MG PO TABS
40.0000 mg | ORAL_TABLET | Freq: Every day | ORAL | Status: DC
  Administered 2013-12-15 – 2013-12-16 (×2): 40 mg via ORAL
  Filled 2013-12-14 (×2): qty 1

## 2013-12-14 MED ORDER — PANTOPRAZOLE SODIUM 40 MG PO TBEC
40.0000 mg | DELAYED_RELEASE_TABLET | Freq: Every day | ORAL | Status: DC
  Administered 2013-12-15 – 2013-12-16 (×2): 40 mg via ORAL
  Filled 2013-12-14 (×2): qty 1

## 2013-12-14 MED ORDER — DARUNAVIR ETHANOLATE 800 MG PO TABS
800.0000 mg | ORAL_TABLET | Freq: Every day | ORAL | Status: DC
  Administered 2013-12-15 – 2013-12-16 (×2): 800 mg via ORAL
  Filled 2013-12-14 (×3): qty 1

## 2013-12-14 MED ORDER — FUROSEMIDE 10 MG/ML IJ SOLN
80.0000 mg | Freq: Once | INTRAMUSCULAR | Status: AC
  Administered 2013-12-14: 80 mg via INTRAVENOUS
  Filled 2013-12-14: qty 8

## 2013-12-14 MED ORDER — DEXTROSE 50 % IV SOLN
50.0000 mL | Freq: Once | INTRAVENOUS | Status: AC
  Administered 2013-12-14: 50 mL via INTRAVENOUS
  Filled 2013-12-14: qty 50

## 2013-12-14 MED ORDER — POTASSIUM CHLORIDE CRYS ER 10 MEQ PO TBCR
10.0000 meq | EXTENDED_RELEASE_TABLET | Freq: Every day | ORAL | Status: DC
  Administered 2013-12-15 – 2013-12-16 (×2): 10 meq via ORAL
  Filled 2013-12-14 (×2): qty 1

## 2013-12-14 MED ORDER — TRIAMCINOLONE ACETONIDE 0.1 % EX CREA
1.0000 "application " | TOPICAL_CREAM | Freq: Four times a day (QID) | CUTANEOUS | Status: DC | PRN
  Administered 2013-12-15 (×3): 1 via TOPICAL
  Filled 2013-12-14: qty 15

## 2013-12-14 MED ORDER — HEPARIN SODIUM (PORCINE) 5000 UNIT/ML IJ SOLN
5000.0000 [IU] | Freq: Three times a day (TID) | INTRAMUSCULAR | Status: DC
  Administered 2013-12-15 – 2013-12-16 (×5): 5000 [IU] via SUBCUTANEOUS
  Filled 2013-12-14 (×8): qty 1

## 2013-12-14 MED ORDER — HYDRALAZINE HCL 50 MG PO TABS
50.0000 mg | ORAL_TABLET | Freq: Three times a day (TID) | ORAL | Status: DC
  Administered 2013-12-15 – 2013-12-16 (×5): 50 mg via ORAL
  Filled 2013-12-14 (×7): qty 1

## 2013-12-14 NOTE — ED Notes (Signed)
Assisted pt onto bedpan.  Able to move lower body well, tolerates the activity well.
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
## 2013-12-14 NOTE — ED Notes (Signed)
Attempted report 

## 2013-12-14 NOTE — ED Provider Notes (Signed)
CSN: [id number]     Arrival date & time 12/14/13  1942 History   First MD Initiated Contact with Patient 12/14/13 1946     Chief Complaint  Patient presents with  . Leg Swelling  . Back Pain  . Pruritis     (Consider location/radiation/quality/duration/timing/severity/associated sxs/prior Treatment) HPI 53 year old female with history of heart failure, diabetes, chronic kidney disease, HIV, hepatitis C, anemia, has had several weeks if worse than baseline shortness of breath with worse than baseline edema for the last several weeks and seen by her doctor yesterday who ordered IV Lasix over this weekend but the home health nurse was not able to obtain IV access the patient was sent to the emergency department, the patient has no fever no confusion no chest pain and no change in her chronic abdominal pain or chronic back pain. She has had swelling to her right arm and both of her legs for several weeks if not longer with chronic stasis dermatitis with clear weeping and oozing from her legs with the right arm ultrasound negative for DVT within the last week. She missed her medicines 3 or 4 days within the last week including Lasix. Past Medical History  Diagnosis Date  . Stroke   . Meningitis   . HIV (human immunodeficiency virus infection) 1981  . Hypertension   . Gout   . Muscle spasms of head and/or neck   . CHF (congestive heart failure)     [name] 06/18/2013  . HCV (hepatitis C virus)     chronic/notes 06/18/2013  . Type II diabetes mellitus     [name] 06/18/2013  . AIHA (autoimmune hemolytic anemia)     [name] 06/18/2013  . Hypertensive encephalopathy ~ 05/2013    hospitalaized/notes 06/18/2013  . Exertional shortness of breath     [name] 06/18/2013  . Anxiety     [name] 06/18/2013  . History of syphilis   . High cholesterol   . CKD (chronic kidney disease), stage III   . Nephrotic syndrome   . Cirrhosis   . GERD (gastroesophageal reflux disease)   . Migraine     "all the time"  (11/27/2013)  . Seizures     "last sz was week before last" (11/27/2013)   Past Surgical History  Procedure Laterality Date  . Hip pinning Right 1980's  . Av fistula placement Right 07/24/2013    Procedure: RIGHT arm exploration of antecubital space;  Surgeon: Sherren Kerns, MD;  Location: [facility] OR;  Service: Vascular;  Laterality: Right;   Family History  Problem Relation Age of Onset  . Cancer - Colon Mother   . Cancer Father   . Hypertension Father   . Diabetes    . Diabetes Sister    History  Substance Use Topics  . Smoking status: Former Smoker    Types: Cigarettes    Quit date: 06/19/2010  . Smokeless tobacco: Never Used  . Alcohol Use: Yes   OB History   Grav Para Term Preterm Abortions TAB SAB Ect Mult Living                 Review of Systems  10 Systems reviewed and are negative for acute change except as noted in the HPI.  Allergies  Ceftriaxone; Morphine and related; and Norvasc  Home Medications   Prior to Admission medications   Medication Sig Start Date End Date Taking? Authorizing Provider  abacavir (ZIAGEN) 300 MG tablet Take 300 mg by mouth 2 (two) times daily.  Yes Historical Provider, MD  acyclovir (ZOVIRAX) 800 MG tablet Take 800 mg by mouth daily.    Yes Historical Provider, MD  aspirin EC 81 MG tablet Take 81 mg by mouth daily.   Yes Historical Provider, MD  cloNIDine (CATAPRES) 0.3 MG tablet Take 0.3 mg by mouth 3 (three) times daily.   Yes Historical Provider, MD  Darunavir Ethanolate (PREZISTA) 800 MG tablet Take 800 mg by mouth daily with breakfast.   Yes Historical Provider, MD  furosemide (LASIX) 80 MG tablet Take 80 mg by mouth 2 (two) times daily.   Yes Historical Provider, MD  hydrALAZINE (APRESOLINE) 50 MG tablet Take 50 mg by mouth 3 (three) times daily.   Yes Historical Provider, MD  hydrOXYzine (ATARAX/VISTARIL) 10 MG tablet Take 10 mg by mouth 3 (three) times daily as needed for itching.   Yes Historical Provider, MD  insulin aspart  (NOVOLOG) 100 UNIT/ML injection Inject 10-25 Units into the skin 3 (three) times daily before meals. Based on sliding scale   Yes Historical Provider, MD  insulin glargine (LANTUS) 100 UNIT/ML injection Inject 0.3 mLs (30 Units total) into the skin at bedtime. 10/14/13  Yes Annett Gula, MD  labetalol (NORMODYNE) 200 MG tablet Take 400 mg by mouth 2 (two) times daily.   Yes Historical Provider, MD  lamiVUDine (EPIVIR) 150 MG tablet Take 150 mg by mouth daily.   Yes Historical Provider, MD  levETIRAcetam (KEPPRA) 500 MG tablet Take 1,500 mg by mouth 2 (two) times daily.   Yes Historical Provider, MD  lisinopril (PRINIVIL,ZESTRIL) 40 MG tablet Take 1 tablet (40 mg total) by mouth daily. 09/04/13  Yes Rocco Serene, MD  Multiple Vitamin (MULTIVITAMIN WITH MINERALS) TABS tablet Take 1 tablet by mouth daily.   Yes Historical Provider, MD  pantoprazole (PROTONIX) 40 MG tablet Take 1 tablet (40 mg total) by mouth daily. 06/02/13  Yes Vivi Barrack, MD  permethrin (ELIMITE) 5 % cream Apply to affected area once 11/14/13  Yes Kristen N Ward, DO  phenytoin (DILANTIN) 100 MG ER capsule Take 300 mg by mouth every morning.   Yes Historical Provider, MD  potassium chloride (K-DUR,KLOR-CON) 10 MEQ tablet Take 1 tablet (10 mEq total) by mouth daily. 10/14/13  Yes Annett Gula, MD  predniSONE (DELTASONE) 10 MG tablet Take 20 mg for 3 days and then 10 mg until finished 12/13/13  Yes Christen Bame, MD  ritonavir (NORVIR) 100 MG capsule Take 100 mg by mouth daily with breakfast.   Yes Historical Provider, MD  triamcinolone cream (KENALOG) 0.1 % Apply 1 application topically 4 (four) times daily as needed (itching).    Yes Historical Provider, MD   BP 178/83  Pulse 75  Temp(Src) 98.5 F (36.9 C) (Oral)  Resp 18  Ht 5\' 3"  (1.6 m)  Wt 197 lb 4.8 oz (89.495 kg)  BMI 34.96 kg/m2  SpO2 100% Physical Exam  Nursing note and vitals reviewed. Constitutional:  Awake, alert, nontoxic appearance.  HENT:  Head: Atraumatic.   Eyes: Right eye exhibits no discharge. Left eye exhibits no discharge.  Neck: Neck supple.  Cardiovascular: Normal rate and regular rhythm.   No murmur heard. Pulmonary/Chest: Effort normal and breath sounds normal. No respiratory distress. She has no wheezes. She has no rales. She exhibits no tenderness.  Normal pulse oximetry room air 99%  Abdominal: Soft. Bowel sounds are normal. She exhibits no distension and no mass. There is tenderness. There is no rebound and no guarding.  Mild diffuse abdominal  tenderness without rebound the patient states is baseline for her  Musculoskeletal: She exhibits edema and tenderness.  Baseline ROM, no obvious new focal weakness. Mild diffuse back tenderness the patient states is baseline for her. Moderate edema right arm and both legs with chronic stasis dermatitis to her lower legs with clear weeping oozing from her lower legs without evidence of cellulitis without erythema or purulent discharge.  Neurological: She is alert.  Mental status and motor strength appears baseline for patient and situation.  Skin: No rash noted.  Psychiatric: She has a normal mood and affect.    ED Course  Procedures (including critical care time) D/w OPC Int Med for Obs. Power port accessed in ED. Labs stable for Pt. Labs Review Labs Reviewed  CBC WITH DIFFERENTIAL - Abnormal; Notable for the following:    RBC 2.07 (*)    Hemoglobin 7.4 (*)    HCT 22.1 (*)    MCV 106.8 (*)    MCH 35.7 (*)    Monocytes Relative 16 (*)    All other components within normal limits  PRO B NATRIURETIC PEPTIDE - Abnormal; Notable for the following:    Pro B Natriuretic peptide (BNP) 1083.0 (*)    All other components within normal limits  COMPREHENSIVE METABOLIC PANEL - Abnormal; Notable for the following:    CO2 17 (*)    Glucose, Bld 69 (*)    BUN 47 (*)    Creatinine, Ser 2.12 (*)    Calcium 7.6 (*)    Total Protein 5.2 (*)    Albumin 2.0 (*)    AST 71 (*)    ALT 96 (*)     Alkaline Phosphatase 339 (*)    Total Bilirubin 1.4 (*)    GFR calc non Af Amer 25 (*)    GFR calc Af Amer 29 (*)    Anion gap 17 (*)    All other components within normal limits  BASIC METABOLIC PANEL - Abnormal; Notable for the following:    CO2 18 (*)    Glucose, Bld 192 (*)    BUN 45 (*)    Creatinine, Ser 2.17 (*)    Calcium 7.3 (*)    GFR calc non Af Amer 25 (*)    GFR calc Af Amer 28 (*)    All other components within normal limits  MAGNESIUM - Abnormal; Notable for the following:    Magnesium 1.3 (*)    All other components within normal limits  GLUCOSE, CAPILLARY - Abnormal; Notable for the following:    Glucose-Capillary 125 (*)    All other components within normal limits  GLUCOSE, CAPILLARY - Abnormal; Notable for the following:    Glucose-Capillary 194 (*)    All other components within normal limits  GLUCOSE, CAPILLARY - Abnormal; Notable for the following:    Glucose-Capillary 232 (*)    All other components within normal limits  CBG MONITORING, ED - Abnormal; Notable for the following:    Glucose-Capillary 48 (*)    All other components within normal limits  BASIC METABOLIC PANEL  Rosezena Sensor, ED    Imaging Review Dg Chest 2 View  12/14/2013   CLINICAL DATA:  Lower extremity swelling.  EXAM: CHEST  2 VIEW  COMPARISON:  November 26, 2013.  FINDINGS: The heart size and mediastinal contours are within normal limits. Right lung is clear. Stable linear density seen in left lower lobe consistent with scar. No pneumothorax or pleural effusion is noted. Interval placement of right internal  jugular Port-A-Cath with distal tip in expected position of the SVC. The visualized skeletal structures are unremarkable.  IMPRESSION: No acute cardiopulmonary abnormality seen.   Electronically Signed   By: Roque LiasJames  Green M.D.   On: 12/14/2013 21:32     EKG Interpretation None      MDM   Final diagnoses:  Heart failure, unspecified heart failure chronicity, unspecified heart  failure type  Chronic anemia  Chronic kidney disease, unspecified stage    The patient appears reasonably stabilized for admission considering the current resources, flow, and capabilities available in the ED at this time, and I doubt any other [facility] requiring further screening and/or treatment in the ED prior to admission.    Hurman HornJohn M Beverlyn Mcginness, MD 12/15/13 1310

## 2013-12-14 NOTE — ED Notes (Signed)
Pt experiencing increasing extremity edema for past month increasing in last few days.  Saw PMD yesterday with home health order to begin IV lasix today.  Home health unable to obtain IV access.  Also c/o low T-spine pain and body itching.

## 2013-12-15 ENCOUNTER — Encounter (HOSPITAL_COMMUNITY): Payer: Self-pay | Admitting: Urology

## 2013-12-15 LAB — GLUCOSE, CAPILLARY
GLUCOSE-CAPILLARY: 232 mg/dL — AB (ref 70–99)
GLUCOSE-CAPILLARY: 274 mg/dL — AB (ref 70–99)
Glucose-Capillary: 125 mg/dL — ABNORMAL HIGH (ref 70–99)
Glucose-Capillary: 194 mg/dL — ABNORMAL HIGH (ref 70–99)
Glucose-Capillary: 312 mg/dL — ABNORMAL HIGH (ref 70–99)

## 2013-12-15 LAB — BASIC METABOLIC PANEL
Anion gap: 15 (ref 5–15)
BUN: 45 mg/dL — ABNORMAL HIGH (ref 6–23)
CO2: 18 meq/L — AB (ref 19–32)
Calcium: 7.3 mg/dL — ABNORMAL LOW (ref 8.4–10.5)
Chloride: 112 mEq/L (ref 96–112)
Creatinine, Ser: 2.17 mg/dL — ABNORMAL HIGH (ref 0.50–1.10)
GFR calc Af Amer: 28 mL/min — ABNORMAL LOW (ref >90)
GFR, EST NON AFRICAN AMERICAN: 25 mL/min — AB (ref >90)
GLUCOSE: 192 mg/dL — AB (ref 70–99)
POTASSIUM: 3.7 meq/L (ref 3.7–5.3)
SODIUM: 145 meq/L (ref 137–147)

## 2013-12-15 LAB — MAGNESIUM: MAGNESIUM: 1.3 mg/dL — AB (ref 1.5–2.5)

## 2013-12-15 MED ORDER — INSULIN GLARGINE 100 UNIT/ML ~~LOC~~ SOLN
30.0000 [IU] | Freq: Every day | SUBCUTANEOUS | Status: DC
  Administered 2013-12-15: 30 [IU] via SUBCUTANEOUS
  Filled 2013-12-15 (×2): qty 0.3

## 2013-12-15 MED ORDER — INSULIN ASPART 100 UNIT/ML ~~LOC~~ SOLN
0.0000 [IU] | Freq: Three times a day (TID) | SUBCUTANEOUS | Status: DC
  Administered 2013-12-15: 3 [IU] via SUBCUTANEOUS
  Administered 2013-12-15: 5 [IU] via SUBCUTANEOUS
  Administered 2013-12-15 – 2013-12-16 (×2): 2 [IU] via SUBCUTANEOUS
  Administered 2013-12-16: 5 [IU] via SUBCUTANEOUS

## 2013-12-15 MED ORDER — SODIUM CHLORIDE 0.9 % IJ SOLN
10.0000 mL | INTRAMUSCULAR | Status: DC | PRN
  Administered 2013-12-15 – 2013-12-16 (×4): 10 mL

## 2013-12-15 MED ORDER — OXYCODONE HCL 5 MG PO TABS
5.0000 mg | ORAL_TABLET | Freq: Four times a day (QID) | ORAL | Status: DC | PRN
  Administered 2013-12-15 (×2): 5 mg via ORAL
  Filled 2013-12-15 (×2): qty 1

## 2013-12-15 MED ORDER — NYSTATIN 100000 UNIT/GM EX POWD
Freq: Two times a day (BID) | CUTANEOUS | Status: DC
  Administered 2013-12-15 – 2013-12-16 (×2): via TOPICAL
  Filled 2013-12-15: qty 15

## 2013-12-15 MED ORDER — LAMIVUDINE 150 MG PO TABS
150.0000 mg | ORAL_TABLET | Freq: Every day | ORAL | Status: DC
  Administered 2013-12-15 – 2013-12-16 (×2): 150 mg via ORAL
  Filled 2013-12-15 (×2): qty 1

## 2013-12-15 MED ORDER — FUROSEMIDE 10 MG/ML IJ SOLN
80.0000 mg | Freq: Two times a day (BID) | INTRAMUSCULAR | Status: DC
  Administered 2013-12-15 – 2013-12-16 (×2): 80 mg via INTRAVENOUS
  Filled 2013-12-15 (×4): qty 8

## 2013-12-15 NOTE — Progress Notes (Signed)
Patient states she is "itchy all over".  PRNs for itching administered.  Patient scratched right thumb to point of bleeding. Bandaid applied.  Will continue to monitor.

## 2013-12-15 NOTE — Progress Notes (Signed)
Pt arrived to floor in NAD. Pt oriented to room and floor, placed on telemetry monitor. Pt given snack, d/t CBG low in ED. Will continue to monitor. Huel Coventry, RN

## 2013-12-15 NOTE — Care Management Note (Signed)
    Page 1 of 1   12/15/2013     8:57:41 AM CARE MANAGEMENT NOTE 12/15/2013  Patient:  Michelle [name], Michelle [name]   Account Number:  [id number]  Date Initiated:  12/15/2013  Documentation initiated by:  [facility]  Subjective/Objective Assessment:   adm: LE edema     Action/Plan:   discharge planning   Anticipated DC Date:  12/15/2013   Anticipated DC Plan:  HOME W HOME HEALTH SERVICES      DC Planning Services  CM consult      [facility] Choice  Resumption Of Svcs/PTA Provider   Choice offered to / List presented to:          [facility] arranged  HH-1 RN  HH-10 DISEASE MANAGEMENT      HH agency  [facility]   Status of service:  In process, will continue to follow Medicare Important Message given?   (If response is "NO", the following Medicare IM given date fields will be blank) Date Medicare IM given:   Medicare IM given by:   Date Additional Medicare IM given:   Additional Medicare IM given by:    Discharge Disposition:  HOME W HOME HEALTH SERVICES  Per UR Regulation:    If discussed at Long Length of Stay Meetings, dates discussed:    Comments:  12/15/13 08:50 CM notes pt is active with [facility] for HHRN/DIsease Mgmt.  Request has been made for [facility] resumption orders for IV Lasix.  [facility] notified pt has been adm OBS.  Waiting for orders and will monitor for other disposition needs.  Freddy Jaksch, BSN, CM [phone].
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
## 2013-12-15 NOTE — Progress Notes (Signed)
MD notified as patient states her labia itch and she is experiencing moderate right shoulder pain with movement.  Orders placed for pain PRN and nystatin powder.  Will administer and continue to monitor.
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
## 2013-12-15 NOTE — Progress Notes (Signed)
Utilization Review Completed.Ronan Dion T7/19/2015  
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
## 2013-12-16 ENCOUNTER — Ambulatory Visit: Payer: Self-pay | Admitting: Internal Medicine

## 2013-12-16 DIAGNOSIS — R609 Edema, unspecified: Secondary | ICD-10-CM

## 2013-12-16 DIAGNOSIS — L299 Pruritus, unspecified: Secondary | ICD-10-CM

## 2013-12-16 DIAGNOSIS — Z91199 Patient's noncompliance with other medical treatment and regimen due to unspecified reason: Secondary | ICD-10-CM

## 2013-12-16 DIAGNOSIS — R569 Unspecified convulsions: Secondary | ICD-10-CM

## 2013-12-16 DIAGNOSIS — D649 Anemia, unspecified: Secondary | ICD-10-CM

## 2013-12-16 DIAGNOSIS — I10 Essential (primary) hypertension: Secondary | ICD-10-CM

## 2013-12-16 DIAGNOSIS — Z9119 Patient's noncompliance with other medical treatment and regimen: Secondary | ICD-10-CM

## 2013-12-16 DIAGNOSIS — B20 Human immunodeficiency virus [HIV] disease: Secondary | ICD-10-CM

## 2013-12-16 DIAGNOSIS — E119 Type 2 diabetes mellitus without complications: Secondary | ICD-10-CM

## 2013-12-16 DIAGNOSIS — N049 Nephrotic syndrome with unspecified morphologic changes: Secondary | ICD-10-CM

## 2013-12-16 LAB — GLUCOSE, CAPILLARY
Glucose-Capillary: 179 mg/dL — ABNORMAL HIGH (ref 70–99)
Glucose-Capillary: 257 mg/dL — ABNORMAL HIGH (ref 70–99)

## 2013-12-16 LAB — BASIC METABOLIC PANEL
ANION GAP: 16 — AB (ref 5–15)
BUN: 51 mg/dL — AB (ref 6–23)
CHLORIDE: 107 meq/L (ref 96–112)
CO2: 18 mEq/L — ABNORMAL LOW (ref 19–32)
CREATININE: 2.42 mg/dL — AB (ref 0.50–1.10)
Calcium: 7 mg/dL — ABNORMAL LOW (ref 8.4–10.5)
GFR calc Af Amer: 25 mL/min — ABNORMAL LOW (ref >90)
GFR calc non Af Amer: 22 mL/min — ABNORMAL LOW (ref >90)
GLUCOSE: 195 mg/dL — AB (ref 70–99)
Potassium: 4.2 mEq/L (ref 3.7–5.3)
Sodium: 141 mEq/L (ref 137–147)

## 2013-12-16 MED ORDER — PREDNISONE 10 MG PO TABS
10.0000 mg | ORAL_TABLET | Freq: Every day | ORAL | Status: DC
  Filled 2013-12-16: qty 1

## 2013-12-16 MED ORDER — HEPARIN SOD (PORK) LOCK FLUSH 100 UNIT/ML IV SOLN
500.0000 [IU] | INTRAVENOUS | Status: DC | PRN
  Administered 2013-12-16: 500 [IU]
  Filled 2013-12-16: qty 5

## 2013-12-16 MED ORDER — HEPARIN SOD (PORK) LOCK FLUSH 100 UNIT/ML IV SOLN
500.0000 [IU] | INTRAVENOUS | Status: DC
Start: 2013-12-16 — End: 2013-12-16
  Filled 2013-12-16: qty 5

## 2013-12-16 NOTE — Progress Notes (Signed)
Home discharge instructions and medication papers discussed with patient. Follow up appt given to patient. Discussed Diet, activity. Patient able to use teachback when discussing low sodium diet. No prescriptions given. Patient verbally understands instructions.

## 2013-12-16 NOTE — Progress Notes (Signed)
Utilization review completed.  
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
## 2013-12-16 NOTE — Discharge Summary (Signed)
Attending physician note: I personally interviewed and examined this patient on the day prior to hospital discharge and reviewed the discharge plan with resident physicians Dr. Berenice Bouton and Valentino Saxon
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
## 2013-12-16 NOTE — Progress Notes (Signed)
Case discussed with Dr. Sadek soon after the resident saw the patient.  We reviewed the resident's history and exam and pertinent patient test results.  I agree with the assessment, diagnosis, and plan of care documented in the resident's note. 

## 2013-12-17 ENCOUNTER — Encounter (HOSPITAL_COMMUNITY): Payer: Self-pay | Admitting: Emergency Medicine

## 2013-12-17 ENCOUNTER — Emergency Department (HOSPITAL_COMMUNITY): Payer: Medicaid Other

## 2013-12-17 ENCOUNTER — Inpatient Hospital Stay (HOSPITAL_COMMUNITY)
Admission: EM | Admit: 2013-12-17 | Discharge: 2013-12-21 | DRG: 101 | Disposition: A | Discharge status: Home Health | Payer: Medicaid Other | Attending: Internal Medicine | Admitting: Internal Medicine

## 2013-12-17 ENCOUNTER — Telehealth: Payer: Self-pay | Admitting: *Deleted

## 2013-12-17 ENCOUNTER — Ambulatory Visit: Payer: Self-pay

## 2013-12-17 ENCOUNTER — Other Ambulatory Visit: Payer: Self-pay

## 2013-12-17 DIAGNOSIS — E7800 Pure hypercholesterolemia, unspecified: Secondary | ICD-10-CM | POA: Diagnosis present

## 2013-12-17 DIAGNOSIS — E119 Type 2 diabetes mellitus without complications: Secondary | ICD-10-CM | POA: Diagnosis present

## 2013-12-17 DIAGNOSIS — IMO0002 Reserved for concepts with insufficient information to code with codable children: Secondary | ICD-10-CM | POA: Diagnosis present

## 2013-12-17 DIAGNOSIS — Z87891 Personal history of nicotine dependence: Secondary | ICD-10-CM

## 2013-12-17 DIAGNOSIS — D649 Anemia, unspecified: Secondary | ICD-10-CM | POA: Diagnosis present

## 2013-12-17 DIAGNOSIS — R195 Other fecal abnormalities: Secondary | ICD-10-CM | POA: Diagnosis present

## 2013-12-17 DIAGNOSIS — K7460 Unspecified cirrhosis of liver: Secondary | ICD-10-CM | POA: Diagnosis present

## 2013-12-17 DIAGNOSIS — T7401XA Adult neglect or abandonment, confirmed, initial encounter: Secondary | ICD-10-CM | POA: Diagnosis present

## 2013-12-17 DIAGNOSIS — Z91199 Patient's noncompliance with other medical treatment and regimen due to unspecified reason: Secondary | ICD-10-CM

## 2013-12-17 DIAGNOSIS — Z885 Allergy status to narcotic agent status: Secondary | ICD-10-CM

## 2013-12-17 DIAGNOSIS — Z7982 Long term (current) use of aspirin: Secondary | ICD-10-CM

## 2013-12-17 DIAGNOSIS — B1920 Unspecified viral hepatitis C without hepatic coma: Secondary | ICD-10-CM | POA: Diagnosis present

## 2013-12-17 DIAGNOSIS — I872 Venous insufficiency (chronic) (peripheral): Secondary | ICD-10-CM | POA: Diagnosis present

## 2013-12-17 DIAGNOSIS — Z8661 Personal history of infections of the central nervous system: Secondary | ICD-10-CM | POA: Diagnosis not present

## 2013-12-17 DIAGNOSIS — N183 Chronic kidney disease, stage 3 unspecified: Secondary | ICD-10-CM | POA: Diagnosis present

## 2013-12-17 DIAGNOSIS — I129 Hypertensive chronic kidney disease with stage 1 through stage 4 chronic kidney disease, or unspecified chronic kidney disease: Secondary | ICD-10-CM | POA: Diagnosis present

## 2013-12-17 DIAGNOSIS — R32 Unspecified urinary incontinence: Secondary | ICD-10-CM | POA: Diagnosis present

## 2013-12-17 DIAGNOSIS — R4182 Altered mental status, unspecified: Secondary | ICD-10-CM | POA: Diagnosis present

## 2013-12-17 DIAGNOSIS — K219 Gastro-esophageal reflux disease without esophagitis: Secondary | ICD-10-CM | POA: Diagnosis present

## 2013-12-17 DIAGNOSIS — R300 Dysuria: Secondary | ICD-10-CM | POA: Diagnosis not present

## 2013-12-17 DIAGNOSIS — Z8249 Family history of ischemic heart disease and other diseases of the circulatory system: Secondary | ICD-10-CM

## 2013-12-17 DIAGNOSIS — Z888 Allergy status to other drugs, medicaments and biological substances status: Secondary | ICD-10-CM

## 2013-12-17 DIAGNOSIS — R197 Diarrhea, unspecified: Secondary | ICD-10-CM | POA: Diagnosis present

## 2013-12-17 DIAGNOSIS — I509 Heart failure, unspecified: Secondary | ICD-10-CM | POA: Diagnosis present

## 2013-12-17 DIAGNOSIS — Z21 Asymptomatic human immunodeficiency virus [HIV] infection status: Secondary | ICD-10-CM | POA: Diagnosis present

## 2013-12-17 DIAGNOSIS — R259 Unspecified abnormal involuntary movements: Secondary | ICD-10-CM | POA: Diagnosis present

## 2013-12-17 DIAGNOSIS — L299 Pruritus, unspecified: Secondary | ICD-10-CM | POA: Diagnosis present

## 2013-12-17 DIAGNOSIS — Z833 Family history of diabetes mellitus: Secondary | ICD-10-CM

## 2013-12-17 DIAGNOSIS — R7989 Other specified abnormal findings of blood chemistry: Secondary | ICD-10-CM

## 2013-12-17 DIAGNOSIS — K560 Paralytic ileus: Secondary | ICD-10-CM | POA: Diagnosis present

## 2013-12-17 DIAGNOSIS — E7220 Disorder of urea cycle metabolism, unspecified: Secondary | ICD-10-CM

## 2013-12-17 DIAGNOSIS — R609 Edema, unspecified: Secondary | ICD-10-CM | POA: Diagnosis present

## 2013-12-17 DIAGNOSIS — G43909 Migraine, unspecified, not intractable, without status migrainosus: Secondary | ICD-10-CM | POA: Diagnosis present

## 2013-12-17 DIAGNOSIS — Z9119 Patient's noncompliance with other medical treatment and regimen: Secondary | ICD-10-CM

## 2013-12-17 DIAGNOSIS — Z8673 Personal history of transient ischemic attack (TIA), and cerebral infarction without residual deficits: Secondary | ICD-10-CM

## 2013-12-17 DIAGNOSIS — Z79899 Other long term (current) drug therapy: Secondary | ICD-10-CM | POA: Diagnosis not present

## 2013-12-17 DIAGNOSIS — B20 Human immunodeficiency virus [HIV] disease: Secondary | ICD-10-CM

## 2013-12-17 DIAGNOSIS — G40909 Epilepsy, unspecified, not intractable, without status epilepticus: Secondary | ICD-10-CM | POA: Diagnosis present

## 2013-12-17 DIAGNOSIS — R410 Disorientation, unspecified: Secondary | ICD-10-CM

## 2013-12-17 DIAGNOSIS — N189 Chronic kidney disease, unspecified: Secondary | ICD-10-CM

## 2013-12-17 DIAGNOSIS — F411 Generalized anxiety disorder: Secondary | ICD-10-CM | POA: Diagnosis present

## 2013-12-17 DIAGNOSIS — R945 Abnormal results of liver function studies: Secondary | ICD-10-CM

## 2013-12-17 HISTORY — DX: Unspecified psychosis not due to a substance or known physiological condition: F29

## 2013-12-17 HISTORY — DX: Venous insufficiency (chronic) (peripheral): I87.2

## 2013-12-17 LAB — GLUCOSE, CAPILLARY
Glucose-Capillary: 135 mg/dL — ABNORMAL HIGH (ref 70–99)
Glucose-Capillary: 138 mg/dL — ABNORMAL HIGH (ref 70–99)
Glucose-Capillary: 134 mg/dL — ABNORMAL HIGH (ref 70–99)

## 2013-12-17 LAB — COMPREHENSIVE METABOLIC PANEL
ALK PHOS: 401 U/L — AB (ref 39–117)
ALT: 72 U/L — AB (ref 0–35)
AST: 64 U/L — ABNORMAL HIGH (ref 0–37)
Albumin: 2.1 g/dL — ABNORMAL LOW (ref 3.5–5.2)
Anion gap: 15 (ref 5–15)
BUN: 58 mg/dL — AB (ref 6–23)
CO2: 18 mEq/L — ABNORMAL LOW (ref 19–32)
Calcium: 7.4 mg/dL — ABNORMAL LOW (ref 8.4–10.5)
Chloride: 104 mEq/L (ref 96–112)
Creatinine, Ser: 2.58 mg/dL — ABNORMAL HIGH (ref 0.50–1.10)
GFR, EST AFRICAN AMERICAN: 23 mL/min — AB (ref >90)
GFR, EST NON AFRICAN AMERICAN: 20 mL/min — AB (ref >90)
GLUCOSE: 170 mg/dL — AB (ref 70–99)
POTASSIUM: 3.7 meq/L (ref 3.7–5.3)
Sodium: 137 mEq/L (ref 137–147)
Total Bilirubin: 1.5 mg/dL — ABNORMAL HIGH (ref 0.3–1.2)
Total Protein: 5.7 g/dL — ABNORMAL LOW (ref 6.0–8.3)

## 2013-12-17 LAB — CBC WITH DIFFERENTIAL/PLATELET
Basophils Absolute: 0 10*3/uL (ref 0.0–0.1)
Basophils Relative: 0 % (ref 0–1)
EOS PCT: 0 % (ref 0–5)
Eosinophils Absolute: 0 10*3/uL (ref 0.0–0.7)
HCT: 17.9 % — ABNORMAL LOW (ref 36.0–46.0)
HEMOGLOBIN: 6 g/dL — AB (ref 12.0–15.0)
LYMPHS ABS: 1.9 10*3/uL (ref 0.7–4.0)
LYMPHS PCT: 30 % (ref 12–46)
MCH: 35.5 pg — AB (ref 26.0–34.0)
MCHC: 33.5 g/dL (ref 30.0–36.0)
MCV: 105.9 fL — AB (ref 78.0–100.0)
Monocytes Absolute: 0.7 10*3/uL (ref 0.1–1.0)
Monocytes Relative: 11 % (ref 3–12)
NEUTROS PCT: 58 % (ref 43–77)
Neutro Abs: 3.7 10*3/uL (ref 1.7–7.7)
Platelets: 179 10*3/uL (ref 150–400)
RBC: 1.69 MIL/uL — AB (ref 3.87–5.11)
RDW: 13.2 % (ref 11.5–15.5)
WBC: 6.3 10*3/uL (ref 4.0–10.5)

## 2013-12-17 LAB — PREPARE RBC (CROSSMATCH)

## 2013-12-17 LAB — URINALYSIS, ROUTINE W REFLEX MICROSCOPIC
BILIRUBIN URINE: NEGATIVE
GLUCOSE, UA: NEGATIVE mg/dL
HGB URINE DIPSTICK: NEGATIVE
Ketones, ur: NEGATIVE mg/dL
Leukocytes, UA: NEGATIVE
Nitrite: NEGATIVE
PH: 6 (ref 5.0–8.0)
Protein, ur: >300 mg/dL — AB
Specific Gravity, Urine: 1.008 (ref 1.005–1.030)
Urobilinogen, UA: 0.2 mg/dL (ref 0.0–1.0)

## 2013-12-17 LAB — RAPID URINE DRUG SCREEN, HOSP PERFORMED
AMPHETAMINES: NOT DETECTED
BARBITURATES: NOT DETECTED
BENZODIAZEPINES: NOT DETECTED
COCAINE: NOT DETECTED
Opiates: NOT DETECTED
Tetrahydrocannabinol: NOT DETECTED

## 2013-12-17 LAB — AMMONIA: Ammonia: 95 umol/L — ABNORMAL HIGH (ref 11–60)

## 2013-12-17 LAB — TROPONIN I: Troponin I: <0.3 ng/mL (ref <0.30)

## 2013-12-17 LAB — LACTIC ACID, PLASMA: Lactic Acid, Venous: 0.4 mmol/L — ABNORMAL LOW (ref 0.5–2.2)

## 2013-12-17 LAB — URINE MICROSCOPIC-ADD ON

## 2013-12-17 LAB — PHENYTOIN LEVEL, TOTAL: PHENYTOIN LVL: 6.8 ug/mL — AB (ref 10.0–20.0)

## 2013-12-17 LAB — POC OCCULT BLOOD, ED: FECAL OCCULT BLD: POSITIVE — AB

## 2013-12-17 LAB — LIPASE, BLOOD: Lipase: 86 U/L — ABNORMAL HIGH (ref 11–59)

## 2013-12-17 MED ORDER — LAMIVUDINE 150 MG PO TABS
150.0000 mg | ORAL_TABLET | Freq: Every day | ORAL | Status: DC
Start: 2013-12-17 — End: 2013-12-21
  Administered 2013-12-17 – 2013-12-21 (×5): 150 mg via ORAL
  Filled 2013-12-17 (×5): qty 1

## 2013-12-17 MED ORDER — PHENYTOIN SODIUM EXTENDED 100 MG PO CAPS
300.0000 mg | ORAL_CAPSULE | Freq: Every day | ORAL | Status: DC
Start: 2013-12-17 — End: 2013-12-21
  Administered 2013-12-17 – 2013-12-21 (×5): 300 mg via ORAL
  Filled 2013-12-17 (×5): qty 3

## 2013-12-17 MED ORDER — CLONIDINE HCL 0.3 MG PO TABS
0.3000 mg | ORAL_TABLET | Freq: Three times a day (TID) | ORAL | Status: DC
  Administered 2013-12-17 – 2013-12-21 (×13): 0.3 mg via ORAL
  Filled 2013-12-17 (×5): qty 1
  Filled 2013-12-17: qty 3
  Filled 2013-12-17 (×9): qty 1

## 2013-12-17 MED ORDER — DARUNAVIR ETHANOLATE 800 MG PO TABS
800.0000 mg | ORAL_TABLET | Freq: Every day | ORAL | Status: DC
  Administered 2013-12-17 – 2013-12-21 (×5): 800 mg via ORAL
  Filled 2013-12-17 (×6): qty 1

## 2013-12-17 MED ORDER — LISINOPRIL 40 MG PO TABS
40.0000 mg | ORAL_TABLET | Freq: Every day | ORAL | Status: DC
  Administered 2013-12-17 – 2013-12-21 (×5): 40 mg via ORAL
  Filled 2013-12-17 (×5): qty 1

## 2013-12-17 MED ORDER — ASPIRIN EC 81 MG PO TBEC
81.0000 mg | DELAYED_RELEASE_TABLET | Freq: Every day | ORAL | Status: DC
  Administered 2013-12-17 – 2013-12-21 (×5): 81 mg via ORAL
  Filled 2013-12-17 (×5): qty 1

## 2013-12-17 MED ORDER — RITONAVIR 100 MG PO CAPS
100.0000 mg | ORAL_CAPSULE | Freq: Every day | ORAL | Status: DC
  Administered 2013-12-17 – 2013-12-21 (×5): 100 mg via ORAL
  Filled 2013-12-17 (×6): qty 1

## 2013-12-17 MED ORDER — SODIUM CHLORIDE 0.9 % IJ SOLN
3.0000 mL | Freq: Two times a day (BID) | INTRAMUSCULAR | Status: DC
  Administered 2013-12-18 – 2013-12-20 (×4): 3 mL via INTRAVENOUS

## 2013-12-17 MED ORDER — LEVETIRACETAM 750 MG PO TABS
1500.0000 mg | ORAL_TABLET | Freq: Two times a day (BID) | ORAL | Status: DC
  Administered 2013-12-17 – 2013-12-21 (×9): 1500 mg via ORAL
  Filled 2013-12-17 (×10): qty 2

## 2013-12-17 MED ORDER — SODIUM CHLORIDE 0.9 % IV SOLN
INTRAVENOUS | Status: AC
  Administered 2013-12-17: 13:00:00 via INTRAVENOUS

## 2013-12-17 MED ORDER — PANTOPRAZOLE SODIUM 40 MG PO TBEC
40.0000 mg | DELAYED_RELEASE_TABLET | Freq: Every day | ORAL | Status: DC
  Administered 2013-12-17 – 2013-12-21 (×5): 40 mg via ORAL
  Filled 2013-12-17 (×5): qty 1

## 2013-12-17 MED ORDER — HYDRALAZINE HCL 50 MG PO TABS
50.0000 mg | ORAL_TABLET | Freq: Three times a day (TID) | ORAL | Status: DC
  Administered 2013-12-17 – 2013-12-21 (×12): 50 mg via ORAL
  Filled 2013-12-17 (×14): qty 1

## 2013-12-17 MED ORDER — ONDANSETRON HCL 4 MG/2ML IJ SOLN: 4.0000 mg | Freq: Four times a day (QID) | INTRAMUSCULAR | Status: DC | PRN

## 2013-12-17 MED ORDER — LABETALOL HCL 200 MG PO TABS
400.0000 mg | ORAL_TABLET | Freq: Two times a day (BID) | ORAL | Status: DC
  Administered 2013-12-17 – 2013-12-21 (×9): 400 mg via ORAL
  Filled 2013-12-17 (×11): qty 2

## 2013-12-17 MED ORDER — ABACAVIR SULFATE 300 MG PO TABS
300.0000 mg | ORAL_TABLET | Freq: Two times a day (BID) | ORAL | Status: DC
  Administered 2013-12-17 – 2013-12-21 (×9): 300 mg via ORAL
  Filled 2013-12-17 (×10): qty 1

## 2013-12-17 MED ORDER — ONDANSETRON HCL 4 MG PO TABS
4.0000 mg | ORAL_TABLET | Freq: Four times a day (QID) | ORAL | Status: DC | PRN
Start: 2013-12-17 — End: 2013-12-21

## 2013-12-17 MED ORDER — INSULIN ASPART 100 UNIT/ML ~~LOC~~ SOLN
0.0000 [IU] | Freq: Three times a day (TID) | SUBCUTANEOUS | Status: DC
  Administered 2013-12-18 (×2): 3 [IU] via SUBCUTANEOUS
  Administered 2013-12-18 – 2013-12-19 (×2): 2 [IU] via SUBCUTANEOUS
  Administered 2013-12-19: 3 [IU] via SUBCUTANEOUS
  Administered 2013-12-19: 2 [IU] via SUBCUTANEOUS
  Administered 2013-12-20: 3 [IU] via SUBCUTANEOUS
  Administered 2013-12-20: 9 [IU] via SUBCUTANEOUS
  Administered 2013-12-20: 1 [IU] via SUBCUTANEOUS
  Administered 2013-12-21: 5 [IU] via SUBCUTANEOUS
  Administered 2013-12-21: 7 [IU] via SUBCUTANEOUS
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
## 2013-12-17 NOTE — ED Notes (Signed)
Per EMS pt from home, started having diarrhea last night, per EMS diarrhea was all over house, pt just discharged from [facility] yesterday, pt stating to EMS "they just dropped me off over here and left me to die", niece is who called EMS. Pt denies pain. Pt ambulated to EMS, weakness noted by EMS. BP 190/90, HR 70, RR 18

## 2013-12-17 NOTE — ED Provider Notes (Signed)
CSN: [id number]     Arrival date & time 12/17/13  0701 History   First MD Initiated Contact with Patient 12/17/13 [phone]     Chief Complaint  Patient presents with  . Diarrhea      Patient is a 53 y.o. female presenting with diarrhea. The history is provided by the EMS personnel, the patient and a relative. The history is limited by the condition of the patient (AMS).  Diarrhea Pt was seen at 0710. Per EMS and 911 report: family called EMS stating pt has had diarrhea since being discharged from the hospital yesterday. Family was not at scene on EMS arrival. EMS states "diarrhea was all over the house" and pt told EMS "they just dropped me off here and left me to die" after she was discharged from [facility] yesterday for dx CHF, nephrotic syndrome, medication non-compliance. Pt was able to ambulate to EMS stretcher, though c/o "feeling weak." Pt intermittently confused en route to the ED. No reported fevers, no N/V.    Past Medical History  Diagnosis Date  . Stroke   . Meningitis   . HIV (human immunodeficiency virus infection) 1981  . Hypertension   . Gout   . Muscle spasms of head and/or neck   . CHF (congestive heart failure)     [name] 06/18/2013  . HCV (hepatitis C virus)     chronic/notes 06/18/2013  . Type II diabetes mellitus     [name] 06/18/2013  . AIHA (autoimmune hemolytic anemia)     [name] 06/18/2013  . Hypertensive encephalopathy ~ 05/2013    hospitalaized/notes 06/18/2013  . Exertional shortness of breath     [name] 06/18/2013  . Anxiety     [name] 06/18/2013  . History of syphilis   . High cholesterol   . CKD (chronic kidney disease), stage III   . Nephrotic syndrome   . Cirrhosis   . GERD (gastroesophageal reflux disease)   . Migraine     "all the time" (11/27/2013)  . Seizures     "last sz was week before last" (11/27/2013)  . Psychosis   . Chronic venous stasis dermatitis of both lower extremities    Past Surgical History  Procedure Laterality Date  . Hip pinning  Right 1980's  . Av fistula placement Right 07/24/2013    Procedure: RIGHT arm exploration of antecubital space;  Surgeon: Sherren Kerns, MD;  Location: [facility] OR;  Service: Vascular;  Laterality: Right;  . Portacath placement  11/27/2013   Family History  Problem Relation Age of Onset  . Cancer - Colon Mother   . Cancer Father   . Hypertension Father   . Diabetes    . Diabetes Sister    History  Substance Use Topics  . Smoking status: Former Smoker    Types: Cigarettes    Quit date: 06/19/2010  . Smokeless tobacco: Never Used  . Alcohol Use: Yes    Review of Systems  Unable to perform ROS: Mental status change  Gastrointestinal: Positive for diarrhea.      Allergies  Ceftriaxone; Morphine and related; and Norvasc  Home Medications   Prior to Admission medications   Medication Sig Start Date End Date Taking? Authorizing Provider  abacavir (ZIAGEN) 300 MG tablet Take 300 mg by mouth 2 (two) times daily.    Historical Provider, MD  acyclovir (ZOVIRAX) 800 MG tablet Take 800 mg by mouth daily.     Historical Provider, MD  aspirin EC 81 MG tablet Take 81 mg by mouth  daily.    Historical Provider, MD  cloNIDine (CATAPRES) 0.3 MG tablet Take 0.3 mg by mouth 3 (three) times daily.    Historical Provider, MD  Darunavir Ethanolate (PREZISTA) 800 MG tablet Take 800 mg by mouth daily with breakfast.    Historical Provider, MD  furosemide (LASIX) 80 MG tablet Take 80 mg by mouth 2 (two) times daily.    Historical Provider, MD  hydrALAZINE (APRESOLINE) 50 MG tablet Take 50 mg by mouth 3 (three) times daily.    Historical Provider, MD  hydrOXYzine (ATARAX/VISTARIL) 10 MG tablet Take 10 mg by mouth 3 (three) times daily as needed for itching.    Historical Provider, MD  insulin aspart (NOVOLOG) 100 UNIT/ML injection Inject 10-25 Units into the skin 3 (three) times daily before meals. Based on sliding scale    Historical Provider, MD  insulin glargine (LANTUS) 100 UNIT/ML injection Inject 0.3  mLs (30 Units total) into the skin at bedtime. 10/14/13   Annett Gula, MD  labetalol (NORMODYNE) 200 MG tablet Take 400 mg by mouth 2 (two) times daily.    Historical Provider, MD  lamiVUDine (EPIVIR) 150 MG tablet Take 150 mg by mouth daily.    Historical Provider, MD  levETIRAcetam (KEPPRA) 500 MG tablet Take 1,500 mg by mouth 2 (two) times daily.    Historical Provider, MD  lisinopril (PRINIVIL,ZESTRIL) 40 MG tablet Take 1 tablet (40 mg total) by mouth daily. 09/04/13   Rocco Serene, MD  Multiple Vitamin (MULTIVITAMIN WITH MINERALS) TABS tablet Take 1 tablet by mouth daily.    Historical Provider, MD  pantoprazole (PROTONIX) 40 MG tablet Take 1 tablet (40 mg total) by mouth daily. 06/02/13   Vivi Barrack, MD  phenytoin (DILANTIN) 100 MG ER capsule Take 300 mg by mouth every morning.    Historical Provider, MD  potassium chloride (K-DUR,KLOR-CON) 10 MEQ tablet Take 1 tablet (10 mEq total) by mouth daily. 10/14/13   Annett Gula, MD  ritonavir (NORVIR) 100 MG capsule Take 100 mg by mouth daily with breakfast.    Historical Provider, MD  triamcinolone cream (KENALOG) 0.1 % Apply 1 application topically 4 (four) times daily as needed (itching).     Historical Provider, MD   BP 173/78  Pulse 81  Temp(Src) 99.7 F (37.6 C) (Rectal)  Resp 24  SpO2 100% Physical Exam 0715: Physical examination:  Nursing notes reviewed; Vital signs and O2 SAT reviewed;  Constitutional: Well developed, Well nourished, In no acute distress; Head:  Normocephalic, atraumatic; Eyes: EOMI, PERRL, No scleral icterus; ENMT: Mouth and pharynx normal, Mucous membranes dry; Neck: Supple, Full range of motion, No lymphadenopathy; Cardiovascular: Regular rate and rhythm, No gallop; Respiratory: Breath sounds clear & equal bilaterally, No wheezes.  Speaking full sentences with ease, Normal respiratory effort/excursion; Chest: Nontender, Movement normal; Abdomen: Soft, +mild diffuse tenderness to palp. Nondistended, Normal bowel  sounds. Rectal exam performed w/permission of pt and ED RN chaperone present.  Anal tone normal.  Non-tender, soft yellow stool in rectal vault, heme positive.  No fissures, no external hemorrhoids, no palp masses.; Genitourinary: No CVA tenderness; Extremities: Pulses normal, No tenderness, No edema, No calf edema or asymmetry. +chronic stasis changes bilat LE's. No erythema, no ecchymosis.; Neuro: Awake, alert, confused re: events. Major CN grossly intact. No facial droop. Speech clear. Moves extremities on stretcher.; Skin: Color normal, Warm, Dry.   ED Course  Procedures     EKG Interpretation   Date/Time:  Tuesday December 17 2013 07:13:58 EDT Ventricular Rate:  81 PR Interval:  118 QRS Duration: 90 QT Interval:  386 QTC Calculation: 448 R Axis:   11 Text Interpretation:  Sinus rhythm Borderline short PR interval Left  ventricular hypertrophy Baseline wander When compared with ECG of  12/14/2013 No significant change was found Confirmed by [facility]CCMANUS  MD,  Nicholos JohnsKATHLEEN [phone]([id number]) on 12/17/2013 7:42:12 AM      MDM  MDM Reviewed: previous chart, nursing note and vitals Reviewed previous: labs and ECG Interpretation: labs, ECG and x-Peel Total time providing critical care: 30-74 minutes. This excludes time spent performing separately reportable procedures and services. Consults: admitting MD    CRITICAL CARE Performed by: Laray AngerMCMANUS,Teauna Dubach M Total critical care time: 35 Critical care time was exclusive of separately billable procedures and treating other patients. Critical care was necessary to treat or prevent imminent or life-threatening deterioration. Critical care was time spent personally by me on the following activities: development of treatment plan with patient and/or surrogate as well as nursing, discussions with consultants, evaluation of patient's response to treatment, examination of patient, obtaining history from patient or surrogate, ordering and performing treatments and  interventions, ordering and review of laboratory studies, ordering and review of radiographic studies, pulse oximetry and re-evaluation of patient's condition.   Results for orders placed during the hospital encounter of 12/17/13  CBC WITH DIFFERENTIAL      Result Value Ref Range   WBC 6.3  4.0 - 10.5 K/uL   RBC 1.69 (*) 3.87 - 5.11 MIL/uL   Hemoglobin 6.0 (*) 12.0 - 15.0 g/dL   HCT 52.817.9 (*) 41.336.0 - 24.446.0 %   MCV 105.9 (*) 78.0 - 100.0 fL   MCH 35.5 (*) 26.0 - 34.0 pg   MCHC 33.5  30.0 - 36.0 g/dL   RDW 01.013.2  27.211.5 - 53.615.5 %   Platelets 179  150 - 400 K/uL   Neutrophils Relative % 58  43 - 77 %   Neutro Abs 3.7  1.7 - 7.7 K/uL   Lymphocytes Relative 30  12 - 46 %   Lymphs Abs 1.9  0.7 - 4.0 K/uL   Monocytes Relative 11  3 - 12 %   Monocytes Absolute 0.7  0.1 - 1.0 K/uL   Eosinophils Relative 0  0 - 5 %   Eosinophils Absolute 0.0  0.0 - 0.7 K/uL   Basophils Relative 0  0 - 1 %   Basophils Absolute 0.0  0.0 - 0.1 K/uL  COMPREHENSIVE METABOLIC PANEL      Result Value Ref Range   Sodium 137  137 - 147 mEq/L   Potassium 3.7  3.7 - 5.3 mEq/L   Chloride 104  96 - 112 mEq/L   CO2 18 (*) 19 - 32 mEq/L   Glucose, Bld 170 (*) 70 - 99 mg/dL   BUN 58 (*) 6 - 23 mg/dL   Creatinine, Ser 6.442.58 (*) 0.50 - 1.10 mg/dL   Calcium 7.4 (*) 8.4 - 10.5 mg/dL   Total Protein 5.7 (*) 6.0 - 8.3 g/dL   Albumin 2.1 (*) 3.5 - 5.2 g/dL   AST 64 (*) 0 - 37 U/L   ALT 72 (*) 0 - 35 U/L   Alkaline Phosphatase 401 (*) 39 - 117 U/L   Total Bilirubin 1.5 (*) 0.3 - 1.2 mg/dL   GFR calc non Af Amer 20 (*) >90 mL/min   GFR calc Af Amer 23 (*) >90 mL/min   Anion gap 15  5 - 15  LIPASE, BLOOD  Result Value Ref Range   Lipase 86 (*) 11 - 59 U/L  LACTIC ACID, PLASMA      Result Value Ref Range   Lactic Acid, Venous 0.4 (*) 0.5 - 2.2 mmol/L  TROPONIN I      Result Value Ref Range   Troponin I <0.30  <0.30 ng/mL  AMMONIA      Result Value Ref Range   Ammonia 95 (*) 11 - 60 umol/L  POC OCCULT BLOOD, ED       Result Value Ref Range   Fecal Occult Bld POSITIVE (*) NEGATIVE   Dg Chest 2 View 12/14/2013   CLINICAL DATA:  Lower extremity swelling.  EXAM: CHEST  2 VIEW  COMPARISON:  November 26, 2013.  FINDINGS: The heart size and mediastinal contours are within normal limits. Right lung is clear. Stable linear density seen in left lower lobe consistent with scar. No pneumothorax or pleural effusion is noted. Interval placement of right internal jugular Port-A-Cath with distal tip in expected position of the SVC. The visualized skeletal structures are unremarkable.  IMPRESSION: No acute cardiopulmonary abnormality seen.   Electronically Signed   By: Roque Lias M.D.   On: 12/14/2013 21:32   Results for EBONI, COVAL (MRN [id number]) as of 12/17/2013 09:12  Ref. Range 11/30/2013 06:02 12/01/2013 05:36 12/02/2013 04:25 12/03/2013 05:40 12/14/2013 20:42 12/17/2013 08:05  Hemoglobin Latest Range: 12.0-15.0 g/dL 7.4 (L) 6.3 (LL) 6.6 (LL) 7.2 (L) 7.4 (L) 6.0 (LL)  HCT Latest Range: 36.0-46.0 % 21.8 (L) 19.0 (L) 19.2 (L) 20.8 (L) 22.1 (L) 17.9 (L)   Results for FAITH, PATRICELLI (MRN [id number]) as of 12/17/2013 09:12  Ref. Range 09/30/2013 05:01 10/23/2013 13:55 10/25/2013 17:40 11/20/2013 01:32 12/17/2013 08:19  Ammonia Latest Range: 11-60 umol/L 58 87 (H) 90 (H) 105 (H) 95 (H)    0915:  Stool heme positive, but not grossly bloody. H/H lower than baseline, will transfuse PRBC's.  BUN/Cr, LFT's elevated per baseline. Ammonia elevated, as above. Pt urinated while stooling, ED Tech unable to collect either; will re-attempt. Pt is afebrile via rectal temp, and is not orthostatic. No family has come to the ED. Dx and testing d/w pt.  Questions answered.  Verb understanding, agreeable to transfer to [facility] for admit.  T/C to [facility] Resident, case discussed, including:  HPI, pertinent PM/SHx, VS/PE, dx testing, ED course and treatment:  Agreeable to accept transfer/admit to [facility], requests to write temporary orders, obtain medical bed to Dr. Charlesetta Shanks  service.      Laray Anger, DO 12/19/13 1644
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
## 2013-12-17 NOTE — Consult Note (Signed)
[facility] Consult: 1:40 PM 12/17/2013  LOS: 0 days    Referring Provider: Dr  Arnold Long, medicine teaching service  Primary Care Physician:  Clinton Gallant, MD Primary Gastroenterologist:  unassigned    Reason for Consultation:  Anemia.  FOBT +   HPI: Michelle [name] is a 53 y.o. female.  Has long standing HIV (well controlled per notes), Hepatitis C, stage 3 CKD, seizure disorder, anemia, htn. Hx CVA.  05/2013 and 06/2013 ultrasounds show nodular liver/possible cirrhosis but no ascites.    Complicated social situation with family "sponging" off pt's government support.  Pt not compliant with home meds.   Admission 7/18 - 7/20 with nephrotic syndrome.  Workup for anemia (overseen by Dr Beryle Beams) during 6/30 - 7/8 admission included bone marrow biopsy which was inconclusive. Iron, B12 and Folate levels not c/w deficiency.  ? If due to meds plus renal failure.   In past has not complied with outpt Ar[facility].  Has previously been FOBT negative.   Pt readmitted this AM after found down and covered in feces at home.  Stool is now testing FOBT +, previously negative on 06/2013 and 11/26/2013. Stool is not melenic or grossly bloody. She is too confused for me to get adequate history and is currently denying diarrhea or fecal incontinence. No abdominal pain or emesis.  hgb is 6.0.  MCV is 105 (as high as 110 in May).  Hgb has been as low as 6.3 earlier this month and baseline of 7 to 90 going back to 09/2013.  No coagulopathy.  Pt herself is poor historian and complains of being hungry and having loose stools.  Stool C diff negative on 07/20/13.      Past Medical History  Diagnosis Date  . Stroke   . Meningitis   . HIV (human immunodeficiency virus infection) 1981  . Hypertension   . Gout   . Muscle spasms of head and/or  neck   . CHF (congestive heart failure)     [name] 06/18/2013  . HCV (hepatitis C virus)     chronic/notes 06/18/2013  . Type II diabetes mellitus     [name] 06/18/2013  . AIHA (autoimmune hemolytic anemia)     [name] 06/18/2013  . Hypertensive encephalopathy ~ 05/2013    hospitalaized/notes 06/18/2013  . Exertional shortness of breath     [name] 06/18/2013  . Anxiety     [name] 06/18/2013  . History of syphilis   . High cholesterol   . CKD (chronic kidney disease), stage III   . Nephrotic syndrome   . Cirrhosis   . GERD (gastroesophageal reflux disease)   . Migraine     "all the time" (11/27/2013)  . Seizures     "last sz was week before last" (11/27/2013)  . Psychosis   . Chronic venous stasis dermatitis of both lower extremities     Past Surgical History  Procedure Laterality Date  . Hip pinning Right 1980's  . Av fistula placement Right 07/24/2013    Procedure: RIGHT arm exploration of antecubital space;  Surgeon: Jessy Oto  Fields, MD;  Location: [facility];  Service: Vascular;  Laterality: Right;  . Portacath placement  11/27/2013    Prior to Admission medications   Medication Sig Start Date End Date Taking? Authorizing Provider  abacavir (ZIAGEN) 300 MG tablet Take 300 mg by mouth 2 (two) times daily.    Historical Provider, MD  acyclovir (ZOVIRAX) 800 MG tablet Take 800 mg by mouth daily.     Historical Provider, MD  aspirin EC 81 MG tablet Take 81 mg by mouth daily.    Historical Provider, MD  cloNIDine (CATAPRES) 0.3 MG tablet Take 0.3 mg by mouth 3 (three) times daily.    Historical Provider, MD  Darunavir Ethanolate (PREZISTA) 800 MG tablet Take 800 mg by mouth daily with breakfast.    Historical Provider, MD  furosemide (LASIX) 80 MG tablet Take 80 mg by mouth 2 (two) times daily.    Historical Provider, MD  hydrALAZINE (APRESOLINE) 50 MG tablet Take 50 mg by mouth 3 (three) times daily.    Historical Provider, MD  hydrOXYzine (ATARAX/VISTARIL) 10 MG tablet Take 10 mg by mouth 3  (three) times daily as needed for itching.    Historical Provider, MD  insulin aspart (NOVOLOG) 100 UNIT/ML injection Inject 10-25 Units into the skin 3 (three) times daily before meals. Based on sliding scale    Historical Provider, MD  insulin glargine (LANTUS) 100 UNIT/ML injection Inject 0.3 mLs (30 Units total) into the skin at bedtime. 10/14/13   Cresenciano Genre, MD  labetalol (NORMODYNE) 200 MG tablet Take 400 mg by mouth 2 (two) times daily.    Historical Provider, MD  lamiVUDine (EPIVIR) 150 MG tablet Take 150 mg by mouth daily.    Historical Provider, MD  levETIRAcetam (KEPPRA) 500 MG tablet Take 1,500 mg by mouth 2 (two) times daily.    Historical Provider, MD  lisinopril (PRINIVIL,ZESTRIL) 40 MG tablet Take 1 tablet (40 mg total) by mouth daily. 09/04/13   Karren Cobble, MD  Multiple Vitamin (MULTIVITAMIN WITH MINERALS) TABS tablet Take 1 tablet by mouth daily.    Historical Provider, MD  pantoprazole (PROTONIX) 40 MG tablet Take 1 tablet (40 mg total) by mouth daily. 06/02/13   Lesly Dukes, MD  phenytoin (DILANTIN) 100 MG ER capsule Take 300 mg by mouth every morning.    Historical Provider, MD  potassium chloride (K-DUR,KLOR-CON) 10 MEQ tablet Take 1 tablet (10 mEq total) by mouth daily. 10/14/13   Cresenciano Genre, MD  ritonavir (NORVIR) 100 MG capsule Take 100 mg by mouth daily with breakfast.    Historical Provider, MD  triamcinolone cream (KENALOG) 0.1 % Apply 1 application topically 4 (four) times daily as needed (itching).     Historical Provider, MD    Scheduled Meds: . sodium chloride   Intravenous STAT  . abacavir  300 mg Oral BID  . aspirin EC  81 mg Oral Daily  . cloNIDine  0.3 mg Oral TID  . Darunavir Ethanolate  800 mg Oral Q breakfast  . hydrALAZINE  50 mg Oral TID  . insulin aspart  0-9 Units Subcutaneous TID WC  . labetalol  400 mg Oral BID  . lamiVUDine  150 mg Oral Daily  . levETIRAcetam  1,500 mg Oral BID  . lisinopril  40 mg Oral Daily  . pantoprazole  40 mg  Oral Daily  . [START ON 12/18/2013] phenytoin  300 mg Oral [id number]  . [START ON 12/18/2013] ritonavir  100 mg Oral Q breakfast  . sodium  chloride  3 mL Intravenous Q12H   Infusions:   PRN Meds: ondansetron (ZOFRAN) IV, ondansetron   Allergies as of 12/17/2013 - Review Complete 12/17/2013  Allergen Reaction Noted  . Ceftriaxone Other (See Comments) 06/01/2013  . Morphine and related Hives, Itching, and Rash 02/15/2013  . Norvasc [amlodipine besylate] Hives, Itching, and Rash 03/30/2013    Family History  Problem Relation Age of Onset  . Cancer - Colon Mother   . Cancer Father   . Hypertension Father   . Diabetes    . Diabetes Sister     History   Social History  . Marital Status: Single    Spouse Name: N/A    Number of Children: 4  . Years of Education: 11th   Occupational History  . Not on file.   Social History Main Topics  . Smoking status: Former Smoker    Types: Cigarettes    Quit date: 06/19/2010  . Smokeless tobacco: Never Used  . Alcohol Use: Yes  . Drug Use: Yes    Special: Cocaine, Marijuana, "Crack" cocaine     Comment: past history of alcohol, cocaine and IV drug use; "stopped drugs in 2012"  . Sexual Activity: Not Currently    Partners: Male   Other Topics Concern  . Not on file   Social History Narrative   Patient lives at home with sister.    Patient is unemployed.    Patient is single.    Patient has 2 alive and 2 deceased.    Patient has 11th grade education.           REVIEW OF SYSTEMS: Constitutional:  Per HPI ENT:  No nose bleeds Pulm:  No SOB or cough CV:  No palpitations, + LE edema.  GU:  No hematuria, no frequency GI:  Per HPI Heme:  Per HPI   Transfusions:  none Neuro:  No headaches, no peripheral tingling or numbness Derm:  + chronic pruritus. no rash or sores.  Endocrine:  No sweats or chills.  No polyuria or dysuria Immunization:  Not investigated Travel:  None beyond local counties in last few months.    PHYSICAL  EXAM: Vital signs in last 24 hours: Filed Vitals:   12/17/13 1157  BP: 182/92  Pulse: 83  Temp: 98.4 F (36.9 C)  Resp: 21   Wt Readings from Last 3 Encounters:  12/16/13 88.9 kg (195 lb 15.8 oz)  12/13/13 95.8 kg (211 lb 3.2 oz)  12/03/13 82.555 kg (182 lb)    General: cushingoid, obese looking AAF who is in poor health Head:  Moon faces, no asymmetry  Eyes:  No icterus, no pallor Ears:  Not HOH  Nose:  No discharge Mouth:  Non-specific beige coating on surface of tongue Neck:  No JVD, bruits or masses.  No TMG Lungs:  Clear bilaterally.  Some dyspnea with speech. No cough Heart: RRR.  No MRG Abdomen:  Soft, obese, NT, ND.  No mass or HSM.  No bruits.   Rectal: deferred   Musc/Skeltl: no joint swelling or redness Extremities:  1plus pitting edema to knees.    Neurologic:  Weakness on right arm.  Responds to her name and knows she is in th hospital but rambles "off message" with most questions.  No tremor Skin:  No sores. Striae on belly and lower legs.  Tattoos:  none Nodes:  No cervical adenopathy   Psych:  Lethargic, arouseable.    Intake/Output from previous day:   Intake/Output this shift:  LAB RESULTS:  Recent Labs  12/14/13 2042 12/17/13 0805  WBC 4.0 6.3  HGB 7.4* 6.0*  HCT 22.1* 17.9*  PLT 158 179   BMET Lab Results  Component Value Date   NA 137 12/17/2013   NA 141 12/16/2013   NA 145 12/15/2013   K 3.7 12/17/2013   K 4.2 12/16/2013   K 3.7 12/15/2013   CL 104 12/17/2013   CL 107 12/16/2013   CL 112 12/15/2013   CO2 18* 12/17/2013   CO2 18* 12/16/2013   CO2 18* 12/15/2013   GLUCOSE 170* 12/17/2013   GLUCOSE 195* 12/16/2013   GLUCOSE 192* 12/15/2013   BUN 58* 12/17/2013   BUN 51* 12/16/2013   BUN 45* 12/15/2013   CREATININE 2.58* 12/17/2013   CREATININE 2.42* 12/16/2013   CREATININE 2.17* 12/15/2013   CALCIUM 7.4* 12/17/2013   CALCIUM 7.0* 12/16/2013   CALCIUM 7.3* 12/15/2013   LFT  Recent Labs  12/14/13 2042 12/17/13 0805  PROT 5.2* 5.7*    ALBUMIN 2.0* 2.1*  AST 71* 64*  ALT 96* 72*  ALKPHOS 339* 401*  BILITOT 1.4* 1.5*   PT/INR Lab Results  Component Value Date   INR 0.88 11/27/2013   INR 0.83 10/23/2013   INR 0.91 07/11/2013   Hepatitis Panel No results found for this basename: HEPBSAG, HCVAB, HEPAIGM, HEPBIGM,  in the last 72 hours C-Diff No components found with this basename: cdiff   Lipase     Component Value Date/Time   LIPASE 86* 12/17/2013 0805    Drugs of Abuse     Component Value Date/Time   LABOPIA NONE DETECTED 11/19/2013 2239   LABOPIA NEG 11/13/2013 1516   COCAINSCRNUR NONE DETECTED 11/19/2013 2239   COCAINSCRNUR NEG 11/13/2013 1516   LABBENZ NONE DETECTED 11/19/2013 2239   LABBENZ NEG 11/13/2013 1516   AMPHETMU NONE DETECTED 11/19/2013 2239   THCU NONE DETECTED 11/19/2013 2239   LABBARB NONE DETECTED 11/19/2013 2239   LABBARB NEG 11/13/2013 1516     RADIOLOGY STUDIES: Dg Abd Acute W/chest 12/17/2013  FINDINGS: The lungs are adequately inflated. There is stable linear density at the left lung base consistent with atelectasis or scarring. There is no pulmonary edema. The central pulmonary vascularity is prominent but stable. The Port-A-Cath appliance is unchanged. The cardiac silhouette is normal in size.  Within the abdomen the gas pattern is nonspecific. There are a few loops of minimally distended gas-filled small bowel. The stool and gas pattern in the colon and rectum is normal. There are no extraluminal gas collections.  IMPRESSION: 1. There is no acute cardiopulmonary abnormality. 2. The gas pattern is nonspecific. A mild small bowel ileus is not excluded.   Electronically Signed   By: David  Martinique   On: 12/17/2013 07:51    ENDOSCOPIC STUDIES: None in records  IMPRESSION:   *  Chronic anemia.  Now FOBT + Dr Beryle Beams has wanted to avoid transfusions and reserve for symptomatic anemia in order to reduce exposure to additional antigens.  *  AMS.  Hx CVA. Baseline mental  Status not clear, but  on previous admissions described as having AMS and being poor historian.   *  Diarrhea, incontinence.  C diff negative.x Nau with ? Of small bowel ileus.   *  Recent admission within the last 5 days for Nephrotic syndrome in background of stage 3 CKD  *  Hep C, ? Cirrhosis by ultrasounds this year.  Never had GI involvement. Viral count [id number], type 1b.  PLAN:     *  Per Dr Fuller Plan.    Azucena Freed  12/17/2013, 1:40 PM Pager: [phone]       Attending physician's note   I have taken a history, examined the patient and reviewed the chart. I agree with the Advanced Practitioner's note, impression and recommendations. Pt readmitted with mental status changes, confusion, presumed diarrhea with incontinence, chronic anemia and heme + stool. R/O infectious diarrhea. Send GI pathogen panel. Evaluate mental status changes, confusion per primary service. Will likely need colonoscopy to further evaluate heme + stool after other acute problems are evaluated.    Ladene Artist, MD [facility]

## 2013-12-17 NOTE — ED Notes (Signed)
Notified DR Mcmanus about urine and stool mixture. Also tried in and out cath with no success. Will reattempt to get sample at a later time.

## 2013-12-17 NOTE — ED Notes (Signed)
Bed: WA05 Expected date:  Expected time:  Means of arrival:  Comments: EMS 

## 2013-12-17 NOTE — Telephone Encounter (Signed)
Call from [facility] with [facility] - # [phone] ex [phone]  She states pt does not qualify for Occupational Therapy as medicaid will not pay for this service because she does not have a qualified Dx.

## 2013-12-18 ENCOUNTER — Encounter: Payer: Self-pay | Admitting: Licensed Clinical Social Worker

## 2013-12-18 ENCOUNTER — Inpatient Hospital Stay (HOSPITAL_COMMUNITY): Payer: Medicaid Other

## 2013-12-18 DIAGNOSIS — R569 Unspecified convulsions: Secondary | ICD-10-CM

## 2013-12-18 DIAGNOSIS — I509 Heart failure, unspecified: Secondary | ICD-10-CM

## 2013-12-18 DIAGNOSIS — T7401XA Adult neglect or abandonment, confirmed, initial encounter: Secondary | ICD-10-CM

## 2013-12-18 DIAGNOSIS — Z21 Asymptomatic human immunodeficiency virus [HIV] infection status: Secondary | ICD-10-CM

## 2013-12-18 DIAGNOSIS — E119 Type 2 diabetes mellitus without complications: Secondary | ICD-10-CM

## 2013-12-18 DIAGNOSIS — I110 Hypertensive heart disease with heart failure: Secondary | ICD-10-CM

## 2013-12-18 DIAGNOSIS — L299 Pruritus, unspecified: Secondary | ICD-10-CM

## 2013-12-18 LAB — COMPREHENSIVE METABOLIC PANEL
ALK PHOS: 329 U/L — AB (ref 39–117)
ALT: 64 U/L — ABNORMAL HIGH (ref 0–35)
ANION GAP: 15 (ref 5–15)
AST: 89 U/L — ABNORMAL HIGH (ref 0–37)
Albumin: 1.7 g/dL — ABNORMAL LOW (ref 3.5–5.2)
BILIRUBIN TOTAL: 1.3 mg/dL — AB (ref 0.3–1.2)
BUN: 49 mg/dL — AB (ref 6–23)
CHLORIDE: 111 meq/L (ref 96–112)
CO2: 17 meq/L — AB (ref 19–32)
Calcium: 7.3 mg/dL — ABNORMAL LOW (ref 8.4–10.5)
Creatinine, Ser: 2.18 mg/dL — ABNORMAL HIGH (ref 0.50–1.10)
GFR calc Af Amer: 28 mL/min — ABNORMAL LOW (ref >90)
GFR calc non Af Amer: 24 mL/min — ABNORMAL LOW (ref >90)
Glucose, Bld: 100 mg/dL — ABNORMAL HIGH (ref 70–99)
Potassium: 4.2 mEq/L (ref 3.7–5.3)
Sodium: 143 mEq/L (ref 137–147)
Total Protein: 4.9 g/dL — ABNORMAL LOW (ref 6.0–8.3)

## 2013-12-18 LAB — CBC
HEMATOCRIT: 21.4 % — AB (ref 36.0–46.0)
Hemoglobin: 7 g/dL — ABNORMAL LOW (ref 12.0–15.0)
MCH: 35.9 pg — ABNORMAL HIGH (ref 26.0–34.0)
MCHC: 32.7 g/dL (ref 30.0–36.0)
MCV: 109.7 fL — ABNORMAL HIGH (ref 78.0–100.0)
Platelets: 144 10*3/uL — ABNORMAL LOW (ref 150–400)
RBC: 1.95 MIL/uL — AB (ref 3.87–5.11)
RDW: 13.4 % (ref 11.5–15.5)
WBC: 3.7 10*3/uL — AB (ref 4.0–10.5)

## 2013-12-18 LAB — GLUCOSE, CAPILLARY
GLUCOSE-CAPILLARY: 235 mg/dL — AB (ref 70–99)
GLUCOSE-CAPILLARY: 193 mg/dL — AB (ref 70–99)
Glucose-Capillary: 177 mg/dL — ABNORMAL HIGH (ref 70–99)
Glucose-Capillary: 261 mg/dL — ABNORMAL HIGH (ref 70–99)

## 2013-12-18 LAB — TYPE AND SCREEN
ABO/RH(D): O POS
Antibody Screen: POSITIVE
DAT, IgG: NEGATIVE

## 2013-12-18 LAB — URINE CULTURE
COLONY COUNT: NO GROWTH
Culture: NO GROWTH

## 2013-12-18 LAB — CLOSTRIDIUM DIFFICILE BY PCR: Toxigenic C. Difficile by PCR: NEGATIVE

## 2013-12-18 LAB — PHENYTOIN LEVEL, TOTAL: Phenytoin Lvl: 4.7 ug/mL — ABNORMAL LOW (ref 10.0–20.0)

## 2013-12-18 MED ORDER — PEG-KCL-NACL-NASULF-NA ASC-C 100 G PO SOLR: 1.0000 | Freq: Once | ORAL | Status: DC

## 2013-12-18 MED ORDER — DARBEPOETIN ALFA-POLYSORBATE 200 MCG/0.4ML IJ SOLN
200.0000 ug | Freq: Once | INTRAMUSCULAR | Status: AC
  Administered 2013-12-18: 200 ug via SUBCUTANEOUS
  Filled 2013-12-18: qty 0.4

## 2013-12-18 MED ORDER — PEG-KCL-NACL-NASULF-NA ASC-C 100 G PO SOLR
0.5000 | Freq: Once | ORAL | Status: AC
  Administered 2013-12-18: 100 g via ORAL
  Filled 2013-12-18: qty 1

## 2013-12-18 MED ORDER — FUROSEMIDE 80 MG PO TABS
80.0000 mg | ORAL_TABLET | Freq: Two times a day (BID) | ORAL | Status: DC
  Filled 2013-12-18 (×2): qty 1

## 2013-12-18 MED ORDER — PEG-KCL-NACL-NASULF-NA ASC-C 100 G PO SOLR
0.5000 | Freq: Once | ORAL | Status: AC
  Administered 2013-12-19: 100 g via ORAL
  Filled 2013-12-18: qty 1
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
## 2013-12-18 NOTE — Progress Notes (Signed)
Patient ID: MAESA PAINE, female   DOB: Apr 06, 1961, 53 y.o.   MRN: [id number] CMIS message received from Domingo Mend RN, [facility] Care Manager on 12/17/2013: I placed a call to pt's cousin [name]) @ [phone]; after not being able to reach pt. @ her home number [phone]. I haven't been able to contact pt. for well over a month. I spoke w/ [name] & discussed pt's current status. She informed me, that pt. is currently in the hospital. Also related, pt's sister has obtained a 2 bedroom apartment & plans to move in it w/ the pt. in the near future. I confirmed in EPIC - that pt. was hospitalized @ [facility] from 7/18 - 12/16/13 for nephrotic syndrome, seizures, etc. She was readmitted today w/ diarrhea, & anemia, etc. She is currently in the hospital. I will probably defer pt. in the near future d/t inability to contact her, & also her impaired mental status. However, if her sister moves in w/ her, & she needs care management services, I will most definitely follow-up.

## 2013-12-18 NOTE — Progress Notes (Signed)
Daily Rounding Note  12/18/2013, 10:49 AM  LOS: 1 day   SUBJECTIVE:       Amazingly alert and oriented this AM.  Says diarrhea has been about 5 x daily for nearly a week.  Sees some blood in stool, not a lot.  No nausea, no abdominal pain.  Normal BM pattern is formed every other day.  Ate every bit of her solid AM meal.  OBJECTIVE:         Vital signs in last 24 hours:    Temp:  [98.4 F (36.9 C)-99.1 F (37.3 C)] 98.9 F (37.2 C) (07/22 1028) Pulse Rate:  [76-83] 76 (07/22 1028) Resp:  [18-21] 20 (07/22 1028) BP: (132-208)/(69-92) 208/92 mmHg (07/22 1028) SpO2:  [96 %-98 %] 97 % (07/22 1028) Weight:  [88.899 kg (195 lb 15.8 oz)] 88.899 kg (195 lb 15.8 oz) (07/21 2058) Last BM Date: 12/17/13 General:  Moon faced and chronically unwell looking but comfortable and fully alert  Heart: RRR Chest: clear.  No labored breathing Abdomen: soft, obese, ND, NT.  No mass or HSM  Extremities: + knee down 1+ edema.  Neuro/Psych:  Oriented x 3.  Some tremor in hands, but not obvious this is asterixis.   Intake/Output from previous day: 07/21 0701 - 07/22 0700 In: 480 [P.O.:240; I.V.:240] Out: -   Intake/Output this shift:    Lab Results:  Recent Labs  12/17/13 0805 12/18/13 0323  WBC 6.3 3.7*  HGB 6.0* 7.0*  HCT 17.9* 21.4*  PLT 179 144*   BMET  Recent Labs  12/16/13 0435 12/17/13 0805 12/18/13 0323  NA 141 137 143  K 4.2 3.7 4.2  CL 107 104 111  CO2 18* 18* 17*  GLUCOSE 195* 170* 100*  BUN 51* 58* 49*  CREATININE 2.42* 2.58* 2.18*  CALCIUM 7.0* 7.4* 7.3*   LFT  Recent Labs  12/17/13 0805 12/18/13 0323  PROT 5.7* 4.9*  ALBUMIN 2.1* 1.7*  AST 64* 89*  ALT 72* 64*  ALKPHOS 401* 329*  BILITOT 1.5* 1.3*    Studies/Results: Dg Abd Acute W/chest  12/17/2013   CLINICAL DATA:  The gas pattern is nonspecific. A miles ileus is not excluded.  EXAM: ACUTE ABDOMEN SERIES (ABDOMEN 2 VIEW & CHEST 1 VIEW)   COMPARISON:  PA and lateral chest x-Bills of December 14, 2013  FINDINGS: The lungs are adequately inflated. There is stable linear density at the left lung base consistent with atelectasis or scarring. There is no pulmonary edema. The central pulmonary vascularity is prominent but stable. The Port-A-Cath appliance is unchanged. The cardiac silhouette is normal in size.  Within the abdomen the gas pattern is nonspecific. There are a few loops of minimally distended gas-filled small bowel. The stool and gas pattern in the colon and rectum is normal. There are no extraluminal gas collections.  IMPRESSION: 1. There is no acute cardiopulmonary abnormality. 2. The gas pattern is nonspecific. A mild small bowel ileus is not excluded.   Electronically Signed   By: David  Martinique   On: 12/17/2013 07:51   Scheduled Meds: . abacavir  300 mg Oral BID  . aspirin EC  81 mg Oral Daily  . cloNIDine  0.3 mg Oral TID  . Darunavir Ethanolate  800 mg Oral Q breakfast  . hydrALAZINE  50 mg Oral TID  . insulin aspart  0-9 Units Subcutaneous TID WC  . labetalol  400 mg Oral BID  . lamiVUDine  150  mg Oral Daily  . levETIRAcetam  1,500 mg Oral BID  . lisinopril  40 mg Oral Daily  . pantoprazole  40 mg Oral Daily  . phenytoin  300 mg Oral Daily  . ritonavir  100 mg Oral Q breakfast  . sodium chloride  3 mL Intravenous Q12H   Continuous Infusions:  PRN Meds:.ondansetron (ZOFRAN) IV, ondansetron  ASSESMENT:   * Chronic anemia. Now FOBT +  Dr Beryle Beams has wanted to avoid transfusions and reserve for symptomatic anemia in order to reduce exposure to additional antigens.  * AMS. Hx CVA. Baseline mental status not clear, but on previous admissions described as having AMS and being poor historian. Ammonia level is slightly elevated.  * Diarrhea, incontinence. C diff negative. Stool path panel in process.  X Spies with ? small bowel ileus. May have a gastroenteritis.  * Admission 7/18 - 7/20 with nephrotic syndrome in  background of stage 3 CKD.  *  HIV.  * Hep C, ? Cirrhosis by ultrasound 06/2013. Never had GI involvement. Viral count [id number], type 1b. LFTs (alk phos, transaminases) chronically elevated.   *    PLAN   *  Will check AFP level, rule out HCCA.  *  Repeat abdominal ultrasound, last done on 07/19/13.  Needs to be NPO for this for 6 hours. Rule out ascites, if present and tapable will need diagnostic paracentesis to rule out SBP.  *  Colonoscopy and ? EGD for anemia and FOBT + workup, ? Tomorrow vs Friday.     Azucena Freed  12/18/2013, 10:49 AM Pager: [phone]     Attending physician's note   I have taken an interval history, reviewed the chart and examined the patient. I agree with the Advanced Practitioner's note, impression and recommendations. Much more alert today and mental status apparently at baseline. She now gives additional history of diarrhea for several days with some rectal bleeding. Await GI pathogen panel. Hb=7.0 today. [place] evidence of cirrhosis with history of Hep C. AFP today PT/INR normal on 7/1. Proceed with colonoscopy and EGD for evaluation of anemia, hematochezia, diarrhea and heme + stool.   Pricilla Riffle. Fuller Plan, MD [facility]

## 2013-12-18 NOTE — Progress Notes (Signed)
Nutrition Brief Note  Patient identified on the Malnutrition Screening Tool (MST) Report  Wt Readings from Last 15 Encounters:  12/17/13 195 lb 15.8 oz (88.899 kg)  12/16/13 195 lb 15.8 oz (88.9 kg)  12/13/13 211 lb 3.2 oz (95.8 kg)  12/03/13 182 lb (82.555 kg)  11/13/13 195 lb (88.451 kg)  11/08/13 195 lb 9.6 oz (88.724 kg)  11/01/13 205 lb 11.2 oz (93.305 kg)  10/25/13 210 lb 6.4 oz (95.437 kg)  10/13/13 209 lb (94.802 kg)  10/04/13 221 lb 14.4 oz (100.653 kg)  09/27/13 210 lb 12.8 oz (95.618 kg)  09/26/13 206 lb (93.441 kg)  09/25/13 204 lb 6.4 oz (92.715 kg)  09/20/13 196 lb (88.905 kg)  09/08/13 171 lb 15.3 oz (78 kg)    Body mass index is 34.73 kg/(m^2). Patient meets criteria for obesity based on current BMI.   Current diet order is NPO, patient feels that she will be able to consume adequate po on her own and does not need nutritional supplementation. She reports ne recent weight changes.. Labs and medications reviewed.   No nutrition interventions warranted at this time. If nutrition issues arise, please consult RD.   Ebbie Latus RD, LDN

## 2013-12-18 NOTE — Clinical Documentation Improvement (Signed)
Clinical Documentation Clarification #1:   Possible Clinical Conditions?  Chronic Systolic Congestive Heart Failure Chronic Diastolic Congestive Heart Failure Chronic Systolic & Diastolic Congestive Heart Failure Acute Systolic Congestive Heart Failure Acute Diastolic Congestive Heart Failure Acute Systolic & Diastolic Congestive Heart Failure Acute on Chronic Systolic Congestive Heart Failure Acute on Chronic Diastolic Congestive Heart Failure Acute on Chronic Systolic & Diastolic Congestive Heart Failure Other Condition Cannot Clinically Determine   Risk Factors: CHF noted per 7/21 progress notes.  Diagnostics: 7/18: proBNP: 1083.0 ________________________________________________________________________________________________ Clinical Documentation Clarification #2:   Possible Clinical Conditions?  Accelerated Hypertension Malignant Hypertension Other Condition Cannot Clinically Determine   Risk Factors: Hypertension, BP not well controlled, ranged from 170s-200s/70s noted per 7/21 progress notes.  Treatment: Hydralazine 50mg  TID; Clonidine 0.3mg  TID; Labetolol 400mg  BID, and Lisinopril, noted per 7/21 progress notes.    Thank You, Marciano Sequin, Clinical Documentation Specialist:  [phone]  [facility]- Health Information Management

## 2013-12-18 NOTE — H&P (Signed)
INTERNAL MEDICINE TEACHING ATTENDING NOTE  Day 1 of stay  Patient name: Michelle [name]  MRN: [id number] Date of birth: [birth year]   53 y.o.lady with multiple comorbidities including CKD, HTN, DM, HIV, HCV, seizure disorder and chronic anemia, just discharged yesterday admitted back because she was found to be in altered mental state, in feces, possibly after having a seizure at home.   Her vitals are stable. She is alert and oriented today when I meet her. She makes intelligent conversation. She has no gross neurologic deficits. Her cardiac and lung exams are within normal limits. She has mild pitting pedal edema. I have reviewed Dr Earma Reading exam and I agree with it.   I have reviewed the chart, imaging, labs and notes.    Assessment and Plan                                                                        Altered mental status, now resolved - Although it can not be said with certainty but unwitnessed seizures are the best explanation for the patient's abrupt deterioration of mental status after being discharged from the hospital few hours ago. Incontinece to stool aligns with this assumption. She is on Keppra max dose of 1500 mg BID and Phenytoin 300 mg daily one time. I agree with doing Keppra and Phenytoin levels, however levels are difficult to interpret in renal falure. We will consider Neurology advice for management of medications.   Chronic anemia - GI advice appreciated. Would consider darbapoetin for now, patient has been worked up extensively few weeks ago by Dr Cyndie Chime for anemia.    Social situation - Needs better care management at home. Consider social worker consult for assisted living/SNF.  Chronic liver enzyme derangements - likely HCV related.  I have seen and evaluated this patient and discussed it with my IM resident team.  Please see the rest of the plan per resident note from today.   [name] 12/18/2013, 12:04 PM.

## 2013-12-18 NOTE — Progress Notes (Signed)
[facility]  Patient Status: Active (receiving services up to time of hospitalization)  [facility] is providing the following services: RN and HHA  If patient discharges after hours, please call [phone].   Michelle [name] 12/18/2013, 10:45 AM

## 2013-12-19 ENCOUNTER — Encounter (HOSPITAL_COMMUNITY): Payer: Self-pay | Admitting: *Deleted

## 2013-12-19 ENCOUNTER — Encounter (HOSPITAL_COMMUNITY)
Admission: EM | Disposition: A | Discharge status: Home Health | Payer: Self-pay | Source: Home / Self Care | Attending: Internal Medicine

## 2013-12-19 HISTORY — PX: ESOPHAGOGASTRODUODENOSCOPY: SHX5428

## 2013-12-19 HISTORY — PX: COLONOSCOPY: SHX5424

## 2013-12-19 LAB — BASIC METABOLIC PANEL
ANION GAP: 11 (ref 5–15)
BUN: 37 mg/dL — ABNORMAL HIGH (ref 6–23)
CHLORIDE: 115 meq/L — AB (ref 96–112)
CO2: 17 mEq/L — ABNORMAL LOW (ref 19–32)
Calcium: 7.2 mg/dL — ABNORMAL LOW (ref 8.4–10.5)
Creatinine, Ser: 1.86 mg/dL — ABNORMAL HIGH (ref 0.50–1.10)
GFR calc non Af Amer: 30 mL/min — ABNORMAL LOW (ref >90)
GFR, EST AFRICAN AMERICAN: 34 mL/min — AB (ref >90)
Glucose, Bld: 185 mg/dL — ABNORMAL HIGH (ref 70–99)
Potassium: 4 mEq/L (ref 3.7–5.3)
Sodium: 143 mEq/L (ref 137–147)

## 2013-12-19 LAB — GI PATHOGEN PANEL BY PCR, STOOL
C DIFFICILE TOXIN A/B: NEGATIVE
CAMPYLOBACTER BY PCR: NEGATIVE
CRYPTOSPORIDIUM BY PCR: NEGATIVE
E COLI 0157 BY PCR: NEGATIVE
E coli (ETEC) LT/ST: NEGATIVE
E coli (STEC): NEGATIVE
G lamblia by PCR: NEGATIVE
Norovirus GI/GII: NEGATIVE
ROTAVIRUS A BY PCR: NEGATIVE
SALMONELLA BY PCR: NEGATIVE
Shigella by PCR: NEGATIVE

## 2013-12-19 LAB — CBC
HEMATOCRIT: 20.5 % — AB (ref 36.0–46.0)
Hemoglobin: 6.7 g/dL — CL (ref 12.0–15.0)
MCH: 36 pg — ABNORMAL HIGH (ref 26.0–34.0)
MCHC: 32.7 g/dL (ref 30.0–36.0)
MCV: 110.2 fL — ABNORMAL HIGH (ref 78.0–100.0)
PLATELETS: 114 10*3/uL — AB (ref 150–400)
RBC: 1.86 MIL/uL — ABNORMAL LOW (ref 3.87–5.11)
RDW: 13.2 % (ref 11.5–15.5)
WBC: 2.5 10*3/uL — AB (ref 4.0–10.5)

## 2013-12-19 LAB — GLUCOSE, CAPILLARY
GLUCOSE-CAPILLARY: 194 mg/dL — AB (ref 70–99)
GLUCOSE-CAPILLARY: 212 mg/dL — AB (ref 70–99)
Glucose-Capillary: 167 mg/dL — ABNORMAL HIGH (ref 70–99)
Glucose-Capillary: 235 mg/dL — ABNORMAL HIGH (ref 70–99)

## 2013-12-19 LAB — AFP TUMOR MARKER: AFP-Tumor Marker: 8.3 ng/mL — ABNORMAL HIGH (ref 0.0–8.0)

## 2013-12-19 SURGERY — COLONOSCOPY
Anesthesia: Moderate Sedation

## 2013-12-19 MED ORDER — MIDAZOLAM HCL 10 MG/2ML IJ SOLN
INTRAMUSCULAR | Status: DC | PRN
  Administered 2013-12-19 (×3): 2 mg via INTRAVENOUS

## 2013-12-19 MED ORDER — BUTAMBEN-TETRACAINE-BENZOCAINE 2-2-14 % EX AERO
INHALATION_SPRAY | CUTANEOUS | Status: DC | PRN
  Administered 2013-12-19: 2 via TOPICAL

## 2013-12-19 MED ORDER — SODIUM CHLORIDE 0.9 % IV SOLN: INTRAVENOUS | Status: DC

## 2013-12-19 MED ORDER — DIPHENHYDRAMINE HCL 50 MG/ML IJ SOLN
INTRAMUSCULAR | Status: AC
  Filled 2013-12-19: qty 1

## 2013-12-19 MED ORDER — DIPHENHYDRAMINE HCL 50 MG/ML IJ SOLN
INTRAMUSCULAR | Status: DC | PRN
  Administered 2013-12-19: 25 mg via INTRAVENOUS

## 2013-12-19 MED ORDER — MIDAZOLAM HCL 5 MG/ML IJ SOLN
INTRAMUSCULAR | Status: AC
  Filled 2013-12-19: qty 2

## 2013-12-19 MED ORDER — FENTANYL CITRATE 0.05 MG/ML IJ SOLN
INTRAMUSCULAR | Status: DC | PRN
  Administered 2013-12-19 (×3): 25 ug via INTRAVENOUS

## 2013-12-19 MED ORDER — FENTANYL CITRATE 0.05 MG/ML IJ SOLN
INTRAMUSCULAR | Status: AC
  Filled 2013-12-19: qty 4

## 2013-12-19 MED ORDER — SODIUM CHLORIDE 0.9 % IJ SOLN
10.0000 mL | INTRAMUSCULAR | Status: DC | PRN
  Administered 2013-12-19 – 2013-12-20 (×2): 10 mL

## 2013-12-19 NOTE — Progress Notes (Signed)
Inpatient Diabetes Program Recommendations  AACE/ADA: New Consensus Statement on Inpatient Glycemic Control (2013)  Target Ranges:  Prepandial:   less than 140 mg/dL      Peak postprandial:   less than 180 mg/dL (1-2 hours)      Critically ill patients:  140 - 180 mg/dL  Results for MODELL, EISAMAN (MRN [id number]) as of 12/19/2013 13:46  Ref. Range 12/18/2013 11:33 12/18/2013 16:57 12/18/2013 21:04 12/19/2013 07:59 12/19/2013 12:11  Glucose-Capillary Latest Range: 70-99 mg/dL 916 (H) 606 (H) 004 (H) 194 (H) 212 (H)   Inpatient Diabetes Program Recommendations Insulin - Basal: please add a portion of patient's home dose Lantus  Thank you  Piedad Climes BSN, RN,CDE Inpatient Diabetes Coordinator [phone] (team pager)
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
## 2013-12-19 NOTE — Progress Notes (Signed)
Patient refusing bed alarm this evening shift.  Re-educated patient on importance of utilizing bed alarm; patient still refused.Bed in lowest position.  Emphasized use of call bell if needed to use the bathroom; verbalized understanding.  Will continue to monitor patient. 

## 2013-12-19 NOTE — Op Note (Signed)
[facility] [address] [place], [address]   ENDOSCOPY PROCEDURE REPORT  PATIENT: Vallon, Kusel  MR#: [id number] BIRTHDATE: 11/07/1960 , 52  yrs. old GENDER: Female ENDOSCOPIST: Iva Boop, MD, [facility] PROCEDURE DATE:  12/19/2013 PROCEDURE:  EGD w/ biopsy ASA CLASS:     Class III INDICATIONS:  Unexplained diarrhea.   Anemia. MEDICATIONS: Fentanyl 50 mcg IV, Versed 4 mg IV, and Benadryl 25 mg IV TOPICAL ANESTHETIC: none  DESCRIPTION OF PROCEDURE: After the risks benefits and alternatives of the procedure were thoroughly explained, informed consent was obtained.  The    endoscope was introduced through the mouth and advanced to the second portion of the duodenum. Without limitations.  The instrument was slowly withdrawn as the mucosa was fully examined.        STOMACH: Abnormal mucosa was found in the gastric body.  The mucosa was nodular.  Multiple biopsies were performed using cold forceps. Sample sent for histology.  The remainder of the upper endoscopy exam was otherwise normal. duosenal biopsies taken. Retroflexed views revealed no abnormalities.     The scope was then withdrawn from the patient and the procedure completed.  COMPLICATIONS: There were no complications. ENDOSCOPIC IMPRESSION: 1.   Abnormal mucosa was found in the gastric body; The mucosa was nodular; multiple biopsies 2.   The remainder of the upper endoscopy exam was otherwise normal - duodenal biopsies taken  RECOMMENDATIONS: Proceed with a Colonoscopy.   eSigned:  Iva Boop, MD, [facility] 12/19/2013 3:44 PM
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
## 2013-12-19 NOTE — Progress Notes (Signed)
Results for Michelle [name], Michelle [name] (MRN [id number]) as of 12/19/2013 06:32  Ref. Range 12/19/2013 04:53  Hemoglobin Latest Range: 12.0-15.0 g/dL 6.7 (LL)  MD on call paged.

## 2013-12-19 NOTE — Progress Notes (Signed)
Daily Rounding Note  12/19/2013, 8:46 AM  LOS: 2 days   SUBJECTIVE:       Hungry, nearly finished second portion of Moviprep.  Stools yellow, like urine.  No blood.   OBJECTIVE:         Vital signs in last 24 hours:    Temp:  [98.5 F (36.9 C)-98.9 F (37.2 C)] 98.8 F (37.1 C) (07/23 0513) Pulse Rate:  [69-76] 73 (07/23 0513) Resp:  [18-20] 18 (07/23 0513) BP: (176-208)/(78-92) 178/83 mmHg (07/23 0513) SpO2:  [97 %-100 %] 99 % (07/23 0513) Weight:  [88.899 kg (195 lb 15.8 oz)] 88.899 kg (195 lb 15.8 oz) (07/22 2059) Last BM Date: 12/19/13 General: alert, seen on commode and not examined.       Intake/Output from previous day: 07/22 0701 - 07/23 0700 In: 723 [P.O.:720; I.V.:3] Out: -   Intake/Output this shift:    Lab Results:  Recent Labs  12/17/13 0805 12/18/13 0323 12/19/13 0453  WBC 6.3 3.7* 2.5*  HGB 6.0* 7.0* 6.7*  HCT 17.9* 21.4* 20.5*  PLT 179 144* 114*   BMET  Recent Labs  12/17/13 0805 12/18/13 0323 12/19/13 0453  NA 137 143 143  K 3.7 4.2 4.0  CL 104 111 115*  CO2 18* 17* 17*  GLUCOSE 170* 100* 185*  BUN 58* 49* 37*  CREATININE 2.58* 2.18* 1.86*  CALCIUM 7.4* 7.3* 7.2*   LFT  Recent Labs  12/17/13 0805 12/18/13 0323  PROT 5.7* 4.9*  ALBUMIN 2.1* 1.7*  AST 64* 89*  ALT 72* 64*  ALKPHOS 401* 329*  BILITOT 1.5* 1.3*   PT/INR No results found for this basename: LABPROT, INR,  in the last 72 hours  Studies/Results: [place] Abdomen Complete 12/18/2013     FINDINGS: Gallbladder:  Gallbladder is decompressed. Upper normal wall thickness likely related to the decompressed state. There does appear to be some dependent sludge within lumen but this is difficult is assess given the lack of distention. No pericholecystic fluid. The sonographer reports no sonographic Murphy sign.  Common bile duct:  Diameter: Nondilated at 2-3 mm diameter.  Liver:  Subtle nodularity of the hepatic contour  raises the question of cirrhosis. No focal intraparenchymal abnormality.  IVC:  No abnormality visualized.  Pancreas:  Pancreas is normal in the head and body. Tail not well seen secondary to overlying bowel gas.  Spleen:  Size and appearance within normal limits.  Right Kidney:  Length: 12.2 cm. No hydronephrosis. Cortical echogenicity is diffusely increased.  Left Kidney:  Length: 12.7 cm. Poor acoustic window limits assessment of the left kidney. No evidence for hydronephrosis.  Abdominal aorta:  No aneurysm visualized.  Other findings:  None.  IMPRESSION: Echogenic right kidney suggests medical renal disease. The left kidney is not well seen secondary to poor acoustic window. There is no evidence for hydronephrosis in either kidney.  Decompressed gallbladder.   Electronically Signed   By: Kennith CenterEric  Mansell M.D.   On: 12/18/2013 15:45    ASSESMENT:   *  Anemia, chronic and newly FOBT+ stool.  Hgb to 6.7 overnight. Teaching service has been reluctant to transfuse unless symptomatic as effort to prevent antibody formation.   *  Recent onset diarrhea.  C diff negative.   *  Hepatitis C, ? Of cirrhosis but no ascites on ultrasound 7/22.  AFP level 8.3 but no evidence of liver masses on ultrasound.   *  Stage 3 CKD.    *  HIV +.     PLAN   *  Colonoscopy and EGD today.     Jennye Moccasin  12/19/2013, 8:46 AM Pager: [phone]

## 2013-12-19 NOTE — Op Note (Signed)
[facility] [address] [place], [address]   COLONOSCOPY PROCEDURE REPORT  PATIENT: Michelle [name], Michelle [name]  MR#: [id number] BIRTHDATE: July 08, 1960 , 52  yrs. old GENDER: Female ENDOSCOPIST: Iva Boop, MD, [facility] PROCEDURE DATE:  12/19/2013 PROCEDURE:   Colonoscopy with biopsy First Screening Colonoscopy - Avg.  risk and is 50 yrs.  old or older - No.  Prior Negative Screening - Now for repeat screening. N/A  History of Adenoma - Now for follow-up colonoscopy & has been > or = to 3 yrs.  N/A  Polyps Removed Today? No.  Recommend repeat exam, <10 yrs? No. ASA CLASS:   Class III INDICATIONS:unexplained diarrhea and Anemia, non-specific. MEDICATIONS: There was residual sedation effect present from prior procedure, Fentanyl 25 mcg IV, and Versed 2 mg IV  DESCRIPTION OF PROCEDURE:   After the risks benefits and alternatives of the procedure were thoroughly explained, informed consent was obtained.  A digital rectal exam revealed no abnormalities of the rectum.   The     endoscope was introduced through the anus and advanced to the terminal ileum which was intubated for a short distance. No adverse events experienced. The quality of the prep was good, using MoviPrep  The instrument was then slowly withdrawn as the colon was fully examined.      COLON FINDINGS: A normal appearing cecum, ileocecal valve, and appendiceal orifice were identified.  The ascending, hepatic flexure, transverse, splenic flexure, descending, sigmoid colon and rectum appeared unremarkable.  No polyps or cancers were seen. Multiple random biopsies of the area were performed.   The mucosa appeared normal in the terminal ileum.  Multiple random biopsies of the area were performed.  Retroflexed views revealed no abnormalities. The time to cecum=3 minutes 0 seconds.  Withdrawal time=7 minutes 0 seconds.  The scope was withdrawn and the procedure completed. COMPLICATIONS: There were no  complications.  ENDOSCOPIC IMPRESSION: 1.   Normal colon; multiple random biopsies of the area were performed 2.   Normal mucosa in the terminal ileum; multiple random biopsies of the area were performed  RECOMMENDATIONS: Await biopsy results   eSigned:  Iva Boop, MD, [facility] 12/19/2013 3:46 PM

## 2013-12-19 NOTE — Progress Notes (Signed)
MD on call notified regarding critical lab value hgb 6.7.  Patient asymptomatic.  No new orders received.  Will continue to monitor.

## 2013-12-20 ENCOUNTER — Inpatient Hospital Stay (HOSPITAL_COMMUNITY): Payer: Medicaid Other

## 2013-12-20 ENCOUNTER — Encounter: Payer: Self-pay | Admitting: Internal Medicine

## 2013-12-20 DIAGNOSIS — R300 Dysuria: Secondary | ICD-10-CM

## 2013-12-20 LAB — CBC
HCT: 20.5 % — ABNORMAL LOW (ref 36.0–46.0)
Hemoglobin: 6.6 g/dL — CL (ref 12.0–15.0)
MCH: 35.9 pg — ABNORMAL HIGH (ref 26.0–34.0)
MCHC: 32.2 g/dL (ref 30.0–36.0)
MCV: 111.4 fL — ABNORMAL HIGH (ref 78.0–100.0)
Platelets: 125 10*3/uL — ABNORMAL LOW (ref 150–400)
RBC: 1.84 MIL/uL — ABNORMAL LOW (ref 3.87–5.11)
RDW: 13.3 % (ref 11.5–15.5)
WBC: 6 10*3/uL (ref 4.0–10.5)

## 2013-12-20 LAB — BASIC METABOLIC PANEL
Anion gap: 12 (ref 5–15)
BUN: 30 mg/dL — AB (ref 6–23)
CO2: 15 mEq/L — ABNORMAL LOW (ref 19–32)
Calcium: 7.1 mg/dL — ABNORMAL LOW (ref 8.4–10.5)
Chloride: 113 mEq/L — ABNORMAL HIGH (ref 96–112)
Creatinine, Ser: 1.93 mg/dL — ABNORMAL HIGH (ref 0.50–1.10)
GFR, EST AFRICAN AMERICAN: 33 mL/min — AB (ref >90)
GFR, EST NON AFRICAN AMERICAN: 28 mL/min — AB (ref >90)
Glucose, Bld: 300 mg/dL — ABNORMAL HIGH (ref 70–99)
POTASSIUM: 4.2 meq/L (ref 3.7–5.3)
SODIUM: 140 meq/L (ref 137–147)

## 2013-12-20 LAB — GLUCOSE, CAPILLARY
Glucose-Capillary: 265 mg/dL — ABNORMAL HIGH (ref 70–99)
Glucose-Capillary: 353 mg/dL — ABNORMAL HIGH (ref 70–99)
Glucose-Capillary: 232 mg/dL — ABNORMAL HIGH (ref 70–99)
Glucose-Capillary: 256 mg/dL — ABNORMAL HIGH (ref 70–99)

## 2013-12-20 LAB — URINALYSIS, ROUTINE W REFLEX MICROSCOPIC
Glucose, UA: 250 mg/dL — AB
Hgb urine dipstick: NEGATIVE
Ketones, ur: NEGATIVE mg/dL
LEUKOCYTES UA: NEGATIVE
Nitrite: NEGATIVE
PH: 6 (ref 5.0–8.0)
Specific Gravity, Urine: 1.019 (ref 1.005–1.030)
Urobilinogen, UA: 0.2 mg/dL (ref 0.0–1.0)

## 2013-12-20 LAB — URINE MICROSCOPIC-ADD ON

## 2013-12-20 LAB — TYPE AND SCREEN
ABO/RH(D): O POS
ANTIBODY SCREEN: POSITIVE
DAT, IgG: NEGATIVE

## 2013-12-20 MED ORDER — INSULIN GLARGINE 100 UNIT/ML ~~LOC~~ SOLN: 15.0000 [IU] | Freq: Every day | SUBCUTANEOUS | Status: DC

## 2013-12-20 MED ORDER — CAMPHOR-MENTHOL 0.5-0.5 % EX LOTN
TOPICAL_LOTION | CUTANEOUS | Status: DC | PRN
  Administered 2013-12-20: 16:00:00 via TOPICAL
  Administered 2013-12-21: 1 via TOPICAL
  Filled 2013-12-20 (×2): qty 222

## 2013-12-20 MED ORDER — FUROSEMIDE 80 MG PO TABS
80.0000 mg | ORAL_TABLET | Freq: Two times a day (BID) | ORAL | Status: DC
  Administered 2013-12-20 – 2013-12-21 (×2): 80 mg via ORAL
  Filled 2013-12-20 (×4): qty 1

## 2013-12-20 MED ORDER — FLUCONAZOLE 150 MG PO TABS
150.0000 mg | ORAL_TABLET | Freq: Once | ORAL | Status: AC
  Administered 2013-12-20: 150 mg via ORAL
  Filled 2013-12-20: qty 1

## 2013-12-20 MED ORDER — INSULIN GLARGINE 100 UNIT/ML ~~LOC~~ SOLN
15.0000 [IU] | Freq: Every day | SUBCUTANEOUS | Status: DC
  Administered 2013-12-20: 15 [IU] via SUBCUTANEOUS
  Filled 2013-12-20 (×2): qty 0.15

## 2013-12-20 MED ORDER — RISAQUAD PO CAPS
2.0000 | ORAL_CAPSULE | Freq: Every day | ORAL | Status: DC
  Administered 2013-12-20 – 2013-12-21 (×2): 2 via ORAL
  Filled 2013-12-20 (×2): qty 2

## 2013-12-20 MED ORDER — DIPHENOXYLATE-ATROPINE 2.5-0.025 MG PO TABS
1.0000 | ORAL_TABLET | Freq: Three times a day (TID) | ORAL | Status: DC
  Administered 2013-12-20 – 2013-12-21 (×4): 1 via ORAL
  Filled 2013-12-20 (×4): qty 1
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
## 2013-12-20 NOTE — Progress Notes (Signed)
Quick Note:  All biopsies unrevealing In hospital ? If from Keppra Treat symptomatically and consider med change ______

## 2013-12-20 NOTE — Clinical Social Work Note (Addendum)
CSW talked with patient regarding her request for ALF placement. Patient reports that she has not made a decision yet, but is thinking about assisted living. Patient given ALF list for [facility] and also information about services available through the Department of Social Services to assist her with finding placement if or when she is ready. Patient appreciative of CSW's assistance.  Genelle Bal, MSW, LCSW [phone]

## 2013-12-20 NOTE — Progress Notes (Signed)
Inpatient Diabetes Program Recommendations  AACE/ADA: New Consensus Statement on Inpatient Glycemic Control (2013)  Target Ranges:  Prepandial:   less than 140 mg/dL      Peak postprandial:   less than 180 mg/dL (1-2 hours)      Critically ill patients:  140 - 180 mg/dL  Results for Michelle, [name] (MRN [id number]) as of 12/20/2013 14:50  Ref. Range 12/19/2013 21:26 12/20/2013 08:02 12/20/2013 11:15  Glucose-Capillary Latest Range: 70-99 mg/dL 440 (H) 347 (H) 425 (H)   Inpatient Diabetes Program Recommendations Insulin - Basal: please add a portion of patient's home dose Lantus  This coordinator called Dr. Valentino Saxon to request 1/2 home dose Lantus.  Dr. Valentino Saxon will enter order.  Thank you  Piedad Climes BSN, RN,CDE Inpatient Diabetes Coordinator [phone] (team pager)

## 2013-12-20 NOTE — Progress Notes (Addendum)
  PROGRESS NOTE MEDICINE TEACHING ATTENDING   Day 3 of stay Patient name: Michelle [name]   Medical record number: [id number] Date of birth: [birth year]     Doing well. She complained of burning during micturition and suprapubic pain today, along with voiding reddish urine day before. She had few bacteria in her urinalysis on 12/17/13. She reported having 3 loose/runny bowel movement since morning. GI pathogen panel is negative. Dr Leone Payor indicates this could be due to Keppra (6-8% diarrhea rates) - however the patient has been on Keppra for a long time when I see her medication history. I talked to Dr Roseanne Reno, neurologist on call and he also thinks that diarrhea due to Keppra is very rare even at higher doses. I will add probiotic to her regimen. The patient has stable vitals and we will discharge her on probiotic and start lomotil. She will be given follow up for Monday [facility] for diarrhea and we will arrange a neurology consultation as outpatient as well.   For her reported hematuria - UA and Uculture are not significant done on 7/21. I will repeat UA today.   Also complains of right hand pain s/p fall 1 week ago. Slight swelling on right wrist with mild tenderness. Xray wrist done, negative for fracture. Rest, ice, conservative management advised.   I have discussed the care of this patient with my IM team residents. Please see the resident note for details.  Steffan Caniglia 12/20/2013, 2:35 PM.

## 2013-12-20 NOTE — Progress Notes (Signed)
I reviewed patient's medication reconciliation history in regards to her Keppra and Dilantin- this documentation goes back to September 25, 2013. I also looked at previous discharge summaries for earlier in 2015- notably, a discharge on 06/02/2013, 06/20/2013, 07/11/2013, 07/27/2013 and 09/09/2013.  Keppra and Dilantin are listed as medications to continue taking on all of these medication history documentations as well as discharge summaries.  However, I did note that doses for these medications has varied through this time. Keppra has ranged from 500mg  BID to 1500mg  BID. Dilantin on the other hand has had many more dose changes, especially within the past 6 weeks or so.   I talked to the patient this morning about these medications. She was able to tell me she takes Dilantin in the morning and the evening, and when asked in multiple fashions, stated she took 2 tablets in the morning and 2 in the evening. This differs from her medication history for this admission (recorded as 300mg  qAM and 400mg  qHS) as well as most recent discharge summary (300mg  qAM), which also differ from each other. However 2 tablets twice daily does match her medication history from this admission for her Keppra (recorded as 1000mg  BID).  There were two Dilantin levels drawn this admission- the one drawn on 7/21 was 6.46mcg/mL with an albumin of 2.1, which corrects the level to ~28mcg/mL. Another level drawn 7/22 was 4.29mcg/mL with an albumin of 1.7, which corrects the level to ~37mcg/mL. Noted a Keppra level was drawn during this admission as well. There is not a documented level for Keppra to be considered therapeutic, the only thing a Keppra level can inform us of is whether or not the patient has medication in their system.   Also, note that Dilantin and ritonavir each can decrease the serum concentration of the other drug (ie Dilantin can decrease ritonavir concentration and ritonavir can decrease Dilantin concentration)  Patient  has not had any seizures during this admission.  Concerned patient's current medication regimen is far too complicated for her to understand and take reliably. Also concerned that patient's regimen has been changed based on levels that have been drawn during a period of noncompliance and not reflective of actual dose that was prescribed.   Recommendation: 1. Continue Dilantin as currently ordered- 300mg  qAM and recheck a total level along with albumin on 7/27 with AM labs. This will give [place] a steady state level for THIS dose and will assist in determining if 300mg  qAM is Ms. Lavy correct Dilantin dose. 2. Recommend decreasing Keppra to 1000mg  BID as this is what patient reported taking during medication history to the pharmacy technician this admission, and also what I believe she was reporting to me during our conversation today. This will help decrease pill-burden. 3. Monitor very closely for mental status changes or signs and symptoms of seizure.   Neetu Carrozza D. Stephan Draughn, PharmD, BCPS Clinical Pharmacist Pager: [phone] 12/20/2013 10:56 AM

## 2013-12-20 NOTE — Care Management Note (Signed)
CARE MANAGEMENT NOTE 12/20/2013  Patient:  Michelle [name], Michelle [name]   Account Number:  [id number]  Date Initiated:  12/19/2013  Documentation initiated by:  Johny Shock  Subjective/Objective Assessment:   Pt active with [facility] services and HH aide who is actually the pt niece.     Action/Plan:   [facility] notified of pt admission and will notify of d/c plans   Anticipated DC Date:  12/20/2013   Anticipated DC Plan:  HOME W HOME HEALTH SERVICES         Choice offered to / List presented to:          [facility] arranged  HH-1 RN      Status of service:  Completed, signed off Medicare Important Message given?   (If response is "NO", the following Medicare IM given date fields will be blank) Date Medicare IM given:   Medicare IM given by:   Date Additional Medicare IM given:   Additional Medicare IM given by:    Discharge Disposition:  HOME W HOME HEALTH SERVICES  Per UR Regulation:    If discussed at Long Length of Stay Meetings, dates discussed:    Comments:

## 2013-12-20 NOTE — Progress Notes (Signed)
Daily Rounding Note  12/20/2013, 9:23 AM  LOS: 3 days   SUBJECTIVE:       5 or so loose stools in last 12 hours.  No abdominal pain.  Eating everything on her plate, no nausea.  Says that the diarrhea started around/after recent discharge.  Only new med added then that I can find is Keppra as well as Prednisone taper.   OBJECTIVE:         Vital signs in last 24 hours:    Temp:  [97.6 F (36.4 C)-99.3 F (37.4 C)] 99.3 F (37.4 C) (07/24 0553) Pulse Rate:  [58-89] 86 (07/24 0553) Resp:  [18-39] 20 (07/24 0553) BP: (146-233)/(67-142) 168/70 mmHg (07/24 0553) SpO2:  [97 %-100 %] 97 % (07/24 0553) Weight:  [88.899 kg (195 lb 15.8 oz)] 88.899 kg (195 lb 15.8 oz) (07/23 2123) Last BM Date: 12/19/13 General: moon-faced, unhealthy but not acutely ill looking   Heart: RRR Chest: clear.  No labored breathing or dyspnea Abdomen: soft, NT, ND, active BS  Extremities: no CCE Neuro/Psych:  Pleasant, cooperative, relaxed, oriented x 3.   Intake/Output from previous day: 07/23 0701 - 07/24 0700 In: 1450 [P.O.:1440; I.V.:10] Out: -   Intake/Output this shift: Total I/O In: 240 [P.O.:240] Out: -   Lab Results:  Recent Labs  12/18/13 0323 12/19/13 0453 12/20/13 0439  WBC 3.7* 2.5* 6.0  HGB 7.0* 6.7* 6.6*  HCT 21.4* 20.5* 20.5*  PLT 144* 114* 125*   BMET  Recent Labs  12/18/13 0323 12/19/13 0453 12/20/13 0439  NA 143 143 140  K 4.2 4.0 4.2  CL 111 115* 113*  CO2 17* 17* 15*  GLUCOSE 100* 185* 300*  BUN 49* 37* 30*  CREATININE 2.18* 1.86* 1.93*  CALCIUM 7.3* 7.2* 7.1*   LFT  Recent Labs  12/18/13 0323  PROT 4.9*  ALBUMIN 1.7*  AST 89*  ALT 64*  ALKPHOS 329*  BILITOT 1.3*   Scheduled Meds: . abacavir  300 mg Oral BID  . aspirin EC  81 mg Oral Daily  . cloNIDine  0.3 mg Oral TID  . Darunavir Ethanolate  800 mg Oral Q breakfast  . hydrALAZINE  50 mg Oral TID  . insulin aspart  0-9 Units  Subcutaneous TID WC  . labetalol  400 mg Oral BID  . lamiVUDine  150 mg Oral Daily  . levETIRAcetam  1,500 mg Oral BID  . lisinopril  40 mg Oral Daily  . pantoprazole  40 mg Oral Daily  . phenytoin  300 mg Oral Daily  . ritonavir  100 mg Oral Q breakfast  . sodium chloride  3 mL Intravenous Q12H   Continuous Infusions:  PRN Meds:.ondansetron (ZOFRAN) IV, ondansetron, sodium chloride  ASSESMENT:   * Anemia, chronic with newly FOBT+ stool.  Hgb down. Teaching service has been reluctant to transfuse unless symptomatic as effort to prevent antibody formation.  EGD 7/23: nodular gastric mucosa, biopsies pending path report.  * Recent onset diarrhea. C diff negative.  Colonoscopy 7/23: Normal colon and TI; multiple random biopsies were performed.  Wonder if this is s/e of the recently added Keppra.  * Hepatitis C, ? Of cirrhosis but no ascites on ultrasound 7/22. Chronic transaminase and ALK phos elevation.  AFP level 8.3 but no evidence of liver masses on ultrasound.  * Stage 3 CKD.  * HIV +. On multiple meds.  ? Is HIV and it's meds contributing to amemia.  PLAN   *  Await GI biopsies.  No plans for further GI studies.  *  Ask that her attending MDs consider wether or not to stop the Keppra, see how diarrhea behaves.     Azucena Freed  12/20/2013, 9:23 AM   Pager: [phone]  [facility] Attending  I have also seen and assessed the patient and agree with the above note. Start Lomotil tidac and hs for sxs Await bxs  Gatha Mayer, MD, [facility] [phone] (pager) 12/20/2013 10:47 AM

## 2013-12-21 LAB — BASIC METABOLIC PANEL
ANION GAP: 12 (ref 5–15)
BUN: 29 mg/dL — ABNORMAL HIGH (ref 6–23)
CHLORIDE: 113 meq/L — AB (ref 96–112)
CO2: 14 meq/L — AB (ref 19–32)
Calcium: 7.2 mg/dL — ABNORMAL LOW (ref 8.4–10.5)
Creatinine, Ser: 2 mg/dL — ABNORMAL HIGH (ref 0.50–1.10)
GFR calc Af Amer: 31 mL/min — ABNORMAL LOW (ref >90)
GFR calc non Af Amer: 27 mL/min — ABNORMAL LOW (ref >90)
Glucose, Bld: 240 mg/dL — ABNORMAL HIGH (ref 70–99)
Potassium: 3.8 mEq/L (ref 3.7–5.3)
Sodium: 139 mEq/L (ref 137–147)

## 2013-12-21 LAB — CBC
HCT: 20.2 % — ABNORMAL LOW (ref 36.0–46.0)
Hemoglobin: 6.5 g/dL — CL (ref 12.0–15.0)
MCH: 34.9 pg — ABNORMAL HIGH (ref 26.0–34.0)
MCHC: 32.2 g/dL (ref 30.0–36.0)
MCV: 108.6 fL — AB (ref 78.0–100.0)
PLATELETS: 125 10*3/uL — AB (ref 150–400)
RBC: 1.86 MIL/uL — AB (ref 3.87–5.11)
RDW: 13.3 % (ref 11.5–15.5)
WBC: 5.4 10*3/uL (ref 4.0–10.5)

## 2013-12-21 LAB — GLUCOSE, CAPILLARY
GLUCOSE-CAPILLARY: 309 mg/dL — AB (ref 70–99)
Glucose-Capillary: 261 mg/dL — ABNORMAL HIGH (ref 70–99)

## 2013-12-21 MED ORDER — ACETAMINOPHEN 325 MG PO TABS
650.0000 mg | ORAL_TABLET | Freq: Once | ORAL | Status: AC
  Administered 2013-12-21: 650 mg via ORAL
  Filled 2013-12-21: qty 2

## 2013-12-21 MED ORDER — RISAQUAD PO CAPS: 2.0000 | ORAL_CAPSULE | Freq: Every day | ORAL | Status: DC

## 2013-12-21 MED ORDER — DIPHENOXYLATE-ATROPINE 2.5-0.025 MG PO TABS: 1.0000 | ORAL_TABLET | Freq: Three times a day (TID) | ORAL | Status: DC
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
## 2013-12-22 LAB — URINE CULTURE: Colony Count: >=100000

## 2013-12-23 ENCOUNTER — Ambulatory Visit: Payer: Self-pay | Admitting: Internal Medicine

## 2013-12-23 ENCOUNTER — Encounter (HOSPITAL_COMMUNITY): Payer: Self-pay | Admitting: Internal Medicine

## 2013-12-23 ENCOUNTER — Telehealth: Payer: Self-pay | Admitting: *Deleted

## 2013-12-23 LAB — LEVETIRACETAM LEVEL: Levetiracetam Lvl: 74.6 ug/mL

## 2013-12-23 NOTE — Telephone Encounter (Signed)
Thank you Gayle 

## 2013-12-23 NOTE — Telephone Encounter (Signed)
Pt called stating she was d/c from hospital on Saturday. She was admitted for diarrhea, Anemia, heme positive stool, CKD.  She now feels like she has cuts on upper thigh and in vaginal area and she has burning with she voids. This was not noted by patient when she was d/c. Onset yesterday.   Asked pt to come into clinc so we can visualize what is causing the burning.   Will see this afternoon.

## 2013-12-23 NOTE — Discharge Summary (Signed)
I discussed the management of the patient with my team on 12/20/13. I did not see the patient on the day of the discharge because I was not present. I have reviewed the discharge summary and I agree with the discharge instructions and post discharge care that has been outlined by Dr. Valentino Saxon.

## 2013-12-24 ENCOUNTER — Encounter: Payer: Self-pay | Admitting: Internal Medicine

## 2013-12-24 ENCOUNTER — Ambulatory Visit: Payer: Self-pay | Admitting: Internal Medicine

## 2013-12-24 ENCOUNTER — Ambulatory Visit (INDEPENDENT_AMBULATORY_CARE_PROVIDER_SITE_OTHER): Payer: Medicaid Other | Admitting: Internal Medicine

## 2013-12-24 ENCOUNTER — Emergency Department (HOSPITAL_COMMUNITY)
Admission: EM | Admit: 2013-12-24 | Discharge: 2013-12-24 | Disposition: A | Discharge status: Home / Self Care | Payer: Medicaid Other | Attending: Emergency Medicine | Admitting: Emergency Medicine

## 2013-12-24 ENCOUNTER — Encounter: Payer: Self-pay | Admitting: Licensed Clinical Social Worker

## 2013-12-24 ENCOUNTER — Encounter (HOSPITAL_COMMUNITY): Payer: Self-pay | Admitting: Emergency Medicine

## 2013-12-24 VITALS — BP 140/100 | HR 76 | Temp 98.3°F | Ht 65.0 in | Wt 195.0 lb

## 2013-12-24 DIAGNOSIS — L299 Pruritus, unspecified: Secondary | ICD-10-CM

## 2013-12-24 DIAGNOSIS — N183 Chronic kidney disease, stage 3 unspecified: Secondary | ICD-10-CM | POA: Insufficient documentation

## 2013-12-24 DIAGNOSIS — E872 Acidosis, unspecified: Secondary | ICD-10-CM

## 2013-12-24 DIAGNOSIS — F29 Unspecified psychosis not due to a substance or known physiological condition: Secondary | ICD-10-CM

## 2013-12-24 DIAGNOSIS — N039 Chronic nephritic syndrome with unspecified morphologic changes: Secondary | ICD-10-CM

## 2013-12-24 DIAGNOSIS — Z7982 Long term (current) use of aspirin: Secondary | ICD-10-CM | POA: Insufficient documentation

## 2013-12-24 DIAGNOSIS — I129 Hypertensive chronic kidney disease with stage 1 through stage 4 chronic kidney disease, or unspecified chronic kidney disease: Secondary | ICD-10-CM | POA: Diagnosis not present

## 2013-12-24 DIAGNOSIS — Z79899 Other long term (current) drug therapy: Secondary | ICD-10-CM | POA: Diagnosis not present

## 2013-12-24 DIAGNOSIS — E8729 Other acidosis: Secondary | ICD-10-CM

## 2013-12-24 DIAGNOSIS — K219 Gastro-esophageal reflux disease without esophagitis: Secondary | ICD-10-CM | POA: Diagnosis not present

## 2013-12-24 DIAGNOSIS — F411 Generalized anxiety disorder: Secondary | ICD-10-CM | POA: Diagnosis not present

## 2013-12-24 DIAGNOSIS — I509 Heart failure, unspecified: Secondary | ICD-10-CM | POA: Diagnosis not present

## 2013-12-24 DIAGNOSIS — G40909 Epilepsy, unspecified, not intractable, without status epilepticus: Secondary | ICD-10-CM | POA: Insufficient documentation

## 2013-12-24 DIAGNOSIS — D631 Anemia in chronic kidney disease: Secondary | ICD-10-CM

## 2013-12-24 DIAGNOSIS — R1909 Other intra-abdominal and pelvic swelling, mass and lump: Secondary | ICD-10-CM | POA: Diagnosis present

## 2013-12-24 DIAGNOSIS — Z8673 Personal history of transient ischemic attack (TIA), and cerebral infarction without residual deficits: Secondary | ICD-10-CM | POA: Insufficient documentation

## 2013-12-24 DIAGNOSIS — Z87891 Personal history of nicotine dependence: Secondary | ICD-10-CM | POA: Insufficient documentation

## 2013-12-24 DIAGNOSIS — Z21 Asymptomatic human immunodeficiency virus [HIV] infection status: Secondary | ICD-10-CM | POA: Insufficient documentation

## 2013-12-24 DIAGNOSIS — R748 Abnormal levels of other serum enzymes: Secondary | ICD-10-CM

## 2013-12-24 DIAGNOSIS — L290 Pruritus ani: Secondary | ICD-10-CM | POA: Diagnosis not present

## 2013-12-24 DIAGNOSIS — Z8619 Personal history of other infectious and parasitic diseases: Secondary | ICD-10-CM | POA: Diagnosis not present

## 2013-12-24 DIAGNOSIS — Z794 Long term (current) use of insulin: Secondary | ICD-10-CM | POA: Insufficient documentation

## 2013-12-24 DIAGNOSIS — R7401 Elevation of levels of liver transaminase levels: Secondary | ICD-10-CM

## 2013-12-24 DIAGNOSIS — E1129 Type 2 diabetes mellitus with other diabetic kidney complication: Secondary | ICD-10-CM

## 2013-12-24 DIAGNOSIS — R740 Nonspecific elevation of levels of transaminase and lactic acid dehydrogenase [LDH]: Secondary | ICD-10-CM

## 2013-12-24 DIAGNOSIS — E119 Type 2 diabetes mellitus without complications: Secondary | ICD-10-CM | POA: Diagnosis not present

## 2013-12-24 DIAGNOSIS — N189 Chronic kidney disease, unspecified: Secondary | ICD-10-CM

## 2013-12-24 DIAGNOSIS — E1122 Type 2 diabetes mellitus with diabetic chronic kidney disease: Secondary | ICD-10-CM

## 2013-12-24 DIAGNOSIS — R410 Disorientation, unspecified: Secondary | ICD-10-CM

## 2013-12-24 LAB — COMPREHENSIVE METABOLIC PANEL
ALT: 54 U/L — AB (ref 0–35)
AST: 66 U/L — ABNORMAL HIGH (ref 0–37)
Albumin: 2 g/dL — ABNORMAL LOW (ref 3.5–5.2)
Alkaline Phosphatase: 497 U/L — ABNORMAL HIGH (ref 39–117)
Anion gap: 19 — ABNORMAL HIGH (ref 5–15)
BUN: 48 mg/dL — ABNORMAL HIGH (ref 6–23)
CO2: 12 meq/L — AB (ref 19–32)
CREATININE: 2.74 mg/dL — AB (ref 0.50–1.10)
Calcium: 7.5 mg/dL — ABNORMAL LOW (ref 8.4–10.5)
Chloride: 110 mEq/L (ref 96–112)
GFR, EST AFRICAN AMERICAN: 21 mL/min — AB (ref >90)
GFR, EST NON AFRICAN AMERICAN: 19 mL/min — AB (ref >90)
GLUCOSE: 218 mg/dL — AB (ref 70–99)
Potassium: 4.1 mEq/L (ref 3.7–5.3)
SODIUM: 141 meq/L (ref 137–147)
Total Bilirubin: 0.9 mg/dL (ref 0.3–1.2)
Total Protein: 5.7 g/dL — ABNORMAL LOW (ref 6.0–8.3)

## 2013-12-24 LAB — URINE MICROSCOPIC-ADD ON

## 2013-12-24 LAB — CBC WITH DIFFERENTIAL/PLATELET
Basophils Absolute: 0 10*3/uL (ref 0.0–0.1)
Basophils Relative: 0 % (ref 0–1)
Eosinophils Absolute: 0 10*3/uL (ref 0.0–0.7)
Eosinophils Relative: 0 % (ref 0–5)
HEMATOCRIT: 21 % — AB (ref 36.0–46.0)
Hemoglobin: 6.9 g/dL — CL (ref 12.0–15.0)
LYMPHS ABS: 1.5 10*3/uL (ref 0.7–4.0)
LYMPHS PCT: 24 % (ref 12–46)
MCH: 35.8 pg — ABNORMAL HIGH (ref 26.0–34.0)
MCHC: 32.9 g/dL (ref 30.0–36.0)
MCV: 108.8 fL — ABNORMAL HIGH (ref 78.0–100.0)
Monocytes Absolute: 1.1 10*3/uL — ABNORMAL HIGH (ref 0.1–1.0)
Monocytes Relative: 17 % — ABNORMAL HIGH (ref 3–12)
Neutro Abs: 3.8 10*3/uL (ref 1.7–7.7)
Neutrophils Relative %: 60 % (ref 43–77)
Platelets: 236 10*3/uL (ref 150–400)
RBC: 1.93 MIL/uL — ABNORMAL LOW (ref 3.87–5.11)
RDW: 13.5 % (ref 11.5–15.5)
WBC: 6.4 10*3/uL (ref 4.0–10.5)

## 2013-12-24 LAB — URINALYSIS, ROUTINE W REFLEX MICROSCOPIC
Bilirubin Urine: NEGATIVE
Glucose, UA: 100 mg/dL — AB
Ketones, ur: NEGATIVE mg/dL
LEUKOCYTES UA: NEGATIVE
NITRITE: NEGATIVE
SPECIFIC GRAVITY, URINE: 1.016 (ref 1.005–1.030)
UROBILINOGEN UA: 0.2 mg/dL (ref 0.0–1.0)
pH: 6 (ref 5.0–8.0)

## 2013-12-24 LAB — SALICYLATE LEVEL: Salicylate Lvl: <2 mg/dL (ref 2.8–20.0)

## 2013-12-24 LAB — LACTIC ACID, PLASMA: LACTIC ACID: 1.7 mmol/L (ref 0.5–2.2)

## 2013-12-24 MED ORDER — LORAZEPAM 1 MG PO TABS
1.0000 mg | ORAL_TABLET | Freq: Once | ORAL | Status: AC
  Administered 2013-12-24: 1 mg via ORAL
  Filled 2013-12-24: qty 1

## 2013-12-24 MED ORDER — HYDROXYZINE HCL 10 MG PO TABS: 10.0000 mg | ORAL_TABLET | Freq: Three times a day (TID) | ORAL | Status: DC | PRN

## 2013-12-24 MED ORDER — SODIUM BICARBONATE 650 MG PO TABS: 650.0000 mg | ORAL_TABLET | Freq: Two times a day (BID) | ORAL | Status: DC

## 2013-12-24 MED ORDER — DIPHENHYDRAMINE HCL 25 MG PO CAPS
25.0000 mg | ORAL_CAPSULE | Freq: Once | ORAL | Status: AC
  Administered 2013-12-24: 25 mg via ORAL
  Filled 2013-12-24: qty 1

## 2013-12-24 NOTE — ED Notes (Signed)
Melvenia Beam, PA notified of hemoglobin level.

## 2013-12-24 NOTE — ED Notes (Signed)
Patient reminded again that a urine sample is needed.

## 2013-12-24 NOTE — ED Notes (Signed)
Patient sleeping soundly

## 2013-12-24 NOTE — ED Notes (Signed)
Patient aware urine sample is needed.  

## 2013-12-24 NOTE — ED Notes (Signed)
Patient from home. Patient has been experiencing dysuria and itching x 1 week as well as vaginal swelling. Denies discharge. Post-[place]. BP 172/78 HR 78 96% on RA

## 2013-12-24 NOTE — Discharge Instructions (Signed)
Pruritus  °Pruritus is an itch. There are many different problems that can cause an itch. Dry skin is one of the most common causes of itching. Most cases of itching do not require medical attention.  °HOME CARE INSTRUCTIONS  °Make sure your skin is moistened on a regular basis. A moisturizer that contains petroleum jelly is best for keeping moisture in your skin. If you develop a rash, you may try the following for relief:  °· Use corticosteroid cream. °· Apply cool compresses to the affected areas. °· Bathe with Epsom salts or baking soda in the bathwater. °· Soak in colloidal oatmeal baths. These are available at your pharmacy. °· Apply baking soda paste to the rash. Stir water into baking soda until it reaches a paste-like consistency. °· Use an anti-itch lotion. °· Take over-the-counter diphenhydramine medicine by mouth as the instructions direct. °· Avoid scratching. Scratching may cause the rash to become infected. If itching is very bad, your caregiver may suggest prescription lotions or creams to lessen your symptoms. °· Avoid hot showers, which can make itching worse. A cold shower may help with itching as long as you use a moisturizer after the shower. °SEEK MEDICAL CARE IF: °The itching does not go away after several days. °Document Released: 01/26/2011 Document Revised: 09/30/2013 Document Reviewed: 01/26/2011 °ExitCare® Patient Information ©2015 [employer]. This information is not intended to replace advice given to you by your health care provider. Make sure you discuss any questions you have with your health care provider. ° °

## 2013-12-24 NOTE — Progress Notes (Signed)
   Subjective:    Patient ID: Michelle [name], female    DOB: 1960-06-17, 53 y.o.   MRN: [id number]  HPI Comments: Michelle [name] is a 53 year old with a PMH of HTN, HIV (CD4-280, VL < 16 September 2013), HCV, CKD4, DM type 2 (Hgb A1c 7.01 November 2013), grade 2 DD, HTN, migraine and pruritis recently admitted for AMS.  She was scheduled for hospital follow-up with PCP, Dr. Burtis Junes on 07/31 but scheduled an earlier appt because of the itchiness.  She is itchy all over.  Went to ED early this AM for this problem.  Labs were drawn and she was discharged with instruction to keep her [facility] appointment this AM.    Pruritis - generalized pruritis; chronic problem, unchanged from baseline.  She says Atarax usually works for her.  She is not sure if she has any left at home..she does not seem to be too sure about some of her medications and tells me whatever I have in the computer is what she is taking.  She has dry skin lotion that she uses as well.   AMS - recently admitted for AMS of unclear etiology.  Her mental status improved to baseline during admission.    DM - She reports taking all of her medications, however she has been taking 50 units of lantus qHS instead of 30 units.  She did not clearly tell me why the increase, sounds like her readings may have been elevated.       Review of Systems  Constitutional: Negative for chills and appetite change.  Eyes: Negative for visual disturbance.  Gastrointestinal: Positive for blood in stool. Negative for nausea and abdominal pain.  Genitourinary: Negative for dysuria and frequency.  Neurological: Negative for syncope.       Objective:   Physical Exam  Vitals reviewed. Constitutional: She is oriented to person, place, and time. She appears well-developed. No distress.  HENT:  Head: Normocephalic and atraumatic.  Mouth/Throat: Oropharynx is clear and moist. No oropharyngeal exudate.  Eyes: EOM are normal. Pupils are equal, round, and reactive to light.    Cardiovascular: Normal rate, regular rhythm and normal heart sounds.  Exam reveals no gallop and no friction rub.   No murmur heard. Pulmonary/Chest: Effort normal and breath sounds normal. No respiratory distress. She has no wheezes. She has no rales.  Abdominal: Soft. Bowel sounds are normal. She exhibits no distension. There is tenderness.  Mild diffuse TTP  Musculoskeletal: Normal range of motion. She exhibits edema. She exhibits no tenderness.  1+ B/L pretibial edema  Neurological: She is alert and oriented to person, place, and time. No cranial nerve deficit.  Skin: Skin is warm. She is not diaphoretic.  Psychiatric: She has a normal mood and affect. Her behavior is normal.          Assessment & Plan:  Please see problem based assessment and plan.

## 2013-12-24 NOTE — Progress Notes (Signed)
Michelle [name] presents to [facility] following an ED admission.  Michelle [name] recently d/c from hospital stay.  Michelle [name] reports [facility] has yet to make contact with her following recent discharge.  Michelle [name] is active with [facility] for [facility] RN for pill box and according to chart HHA.  Michelle [name] received [facility]CS services from which her niece, [name] was assigned as her aide.  However, Michelle [name] states aide has not been providing care as needed.  Michelle [name] requesting to switch PCS agency.  Michelle [name] states she would like to stay in her home and has the necessary equipment to remain safe.  Michelle [name] states at times when she is really sick is has difficulty getting in/out of tub.  CSW assisted Michelle [name] with contacting [employer] to [employer] agencies.  Michelle [name] notified [facility] and changed to [facility].  Michelle [name] aware [employer] will be contacting her with in the next 2 days. CSW discussed transportation: Michelle [name] states transportation assistance would be beneficial as she is reliant on family at this time.  Michelle [name] states she is capable of setting up her own transportation utilizing [employer].  CSW provided contact information for [employer].   Behavioral Health - Michelle [name] states she is currently not receiving any behavioral health services and but is in agreement for referral to [employer] of Care.  Michelle [name] states "I guess I wouldn't hurt".  Referral faxed to [facility] [place].  Information provided to patient. CSW confirmed Michelle [name]'s contact information.  Michelle [name] states home number remains the same but she does not currently know the location of her cell phone.  Michelle [name] is linked with [name].  Michelle [name] aware all the above agencies will need to contact her via telephone and Michelle [name] will need to make sure line is available and agencies are able to leave messages if voicemail equipped.  CSW provided [name] with contact information, Michelle [name] is aware CSW is available as needed to assist.

## 2013-12-24 NOTE — ED Notes (Signed)
Patient reports the medication did not help to reduce itching, patient appears relaxed and is currently not scratching her legs.

## 2013-12-25 DIAGNOSIS — R748 Abnormal levels of other serum enzymes: Secondary | ICD-10-CM | POA: Insufficient documentation

## 2013-12-25 DIAGNOSIS — E872 Acidosis: Secondary | ICD-10-CM | POA: Insufficient documentation

## 2013-12-25 DIAGNOSIS — E8729 Other acidosis: Secondary | ICD-10-CM | POA: Insufficient documentation

## 2013-12-25 NOTE — Assessment & Plan Note (Signed)
Hgb 6.9 at baseline this AM in ED.  During ROS she reports pisodes of blood in stool, however, recent colonoscopy on 07/23 showed normal colon and biopsies of ileum and colon were negative.  EGD 07/23 showed nodular gastric mucosa, biopsies showed benign gastric mucosa with scattered minimal chronic inflammation. - she is scheduled to see nephrology (Dr. Marisue Humble) regarding epo-stimulating agent on 07/30 - she was reminded to go to this appointment and was provided with contact information

## 2013-12-25 NOTE — Assessment & Plan Note (Signed)
As noted by her PCP at previous visit, Michelle [name] pruritis is likely due to several factors including renal and liver diseases.  The patient reports this itching is the same itching she has experienced in the past but she is not sure if she has been taking the Atarax (does not seem to be clear on what her medications look like). - refill Atarax - she will pick this up from her pharmacy - continue moisturizing lotion - follow-up on derm referral placed at prior visit

## 2013-12-25 NOTE — Assessment & Plan Note (Signed)
BMP done in ED this AM reveals bicarb 12, AG 19.  Bicarb low (17-18) in recent days but 12 is significantly lower.  She says she is eating and drinking normally.  BG 218, no urine ketones so unlikely DKA.  This is likely due to CKD. - check ASA level and lactic acid - both WNL - start bicarb supplement 650mg  BID - patient to follow-up with nephrology on 07/30 - return to clinic for repeat BMP and office visit in 2 weeks

## 2013-12-25 NOTE — Assessment & Plan Note (Addendum)
Michelle [name] has been taking the appropriate amount of Novolog but she has increased her Lantus to 50 units qHS for he past two days.  Glucose 218 on BMP done in ED on day of [facility] visit so I am not concerned for hypoglycemia today, however she should not continue to take 50 units nightly, especially given poor renal function as she would be at risk for hypoglycemic events.   - She was instructed to take insulin as prescribed by her PCP, 30 Lantus units qHS and 10-25 units Novolog TID with meals.  She will follow-up in two weeks.

## 2013-12-25 NOTE — Progress Notes (Signed)
Case discussed with Dr. Wilson at the time of the visit.  We reviewed the resident's history and exam and pertinent patient test results.  I agree with the assessment, diagnosis, and plan of care documented in the resident's note. 

## 2013-12-25 NOTE — Assessment & Plan Note (Signed)
Recently hospitalized for AMS that has since resolved.  She is AAO x 3 this morning and she is able to tell me she was hospitalized due to AMS.  No clear etiology.  She has psych and seizure hx (taking phenytoin and Keppra - levels WNL).  She spoke with CSW prior to visit today and she is agreeable to referral for behavioral health services. - CSW assisting with referral to [employer] of Care - CSW assisting with increasing service in home - PCS, [facility], [facility] to provide assistance with ADLs, medications, etc

## 2013-12-25 NOTE — Assessment & Plan Note (Signed)
Alk phos appears chronically elevated (at least for past year for which there are results), currently 497 (past values mostly 100s-200s, a few higher).  AST and ALT minimally elevated.  Tbili normal.  Albumin 2.0.  12/18/13 abdominal US reveals no CBD dilatation, however nodularity of hepatic contour concerning for cirrhosis.  Elevation in AST could be due to HCV/cirrhosis, however this value is higher than would be expected (usually 2-3x upper limit of normal). - consider GGT, AMA next visit - monitor hepatic function - follow-up in 2 weeks

## 2013-12-27 ENCOUNTER — Encounter: Payer: Self-pay | Admitting: Internal Medicine

## 2013-12-28 DIAGNOSIS — J189 Pneumonia, unspecified organism: Secondary | ICD-10-CM

## 2013-12-28 HISTORY — DX: Pneumonia, unspecified organism: J18.9

## 2013-12-29 ENCOUNTER — Emergency Department (HOSPITAL_COMMUNITY): Payer: Medicaid Other

## 2013-12-29 ENCOUNTER — Emergency Department (HOSPITAL_COMMUNITY)
Admission: EM | Admit: 2013-12-29 | Discharge: 2013-12-29 | Disposition: A | Discharge status: Home / Self Care | Payer: Medicaid Other | Attending: Emergency Medicine | Admitting: Emergency Medicine

## 2013-12-29 ENCOUNTER — Encounter (HOSPITAL_COMMUNITY): Payer: Self-pay | Admitting: Emergency Medicine

## 2013-12-29 DIAGNOSIS — I509 Heart failure, unspecified: Secondary | ICD-10-CM | POA: Diagnosis not present

## 2013-12-29 DIAGNOSIS — E119 Type 2 diabetes mellitus without complications: Secondary | ICD-10-CM | POA: Insufficient documentation

## 2013-12-29 DIAGNOSIS — Z79899 Other long term (current) drug therapy: Secondary | ICD-10-CM | POA: Diagnosis not present

## 2013-12-29 DIAGNOSIS — Z21 Asymptomatic human immunodeficiency virus [HIV] infection status: Secondary | ICD-10-CM | POA: Insufficient documentation

## 2013-12-29 DIAGNOSIS — T148XXA Other injury of unspecified body region, initial encounter: Secondary | ICD-10-CM

## 2013-12-29 DIAGNOSIS — L988 Other specified disorders of the skin and subcutaneous tissue: Secondary | ICD-10-CM | POA: Insufficient documentation

## 2013-12-29 DIAGNOSIS — R220 Localized swelling, mass and lump, head: Secondary | ICD-10-CM | POA: Insufficient documentation

## 2013-12-29 DIAGNOSIS — Z7982 Long term (current) use of aspirin: Secondary | ICD-10-CM | POA: Diagnosis not present

## 2013-12-29 DIAGNOSIS — Z794 Long term (current) use of insulin: Secondary | ICD-10-CM | POA: Diagnosis not present

## 2013-12-29 DIAGNOSIS — N186 End stage renal disease: Secondary | ICD-10-CM | POA: Insufficient documentation

## 2013-12-29 DIAGNOSIS — Z87891 Personal history of nicotine dependence: Secondary | ICD-10-CM | POA: Insufficient documentation

## 2013-12-29 DIAGNOSIS — K219 Gastro-esophageal reflux disease without esophagitis: Secondary | ICD-10-CM | POA: Diagnosis not present

## 2013-12-29 DIAGNOSIS — R221 Localized swelling, mass and lump, neck: Secondary | ICD-10-CM

## 2013-12-29 DIAGNOSIS — Z8673 Personal history of transient ischemic attack (TIA), and cerebral infarction without residual deficits: Secondary | ICD-10-CM | POA: Diagnosis not present

## 2013-12-29 DIAGNOSIS — I120 Hypertensive chronic kidney disease with stage 5 chronic kidney disease or end stage renal disease: Secondary | ICD-10-CM | POA: Diagnosis not present

## 2013-12-29 DIAGNOSIS — Z8619 Personal history of other infectious and parasitic diseases: Secondary | ICD-10-CM | POA: Insufficient documentation

## 2013-12-29 DIAGNOSIS — F411 Generalized anxiety disorder: Secondary | ICD-10-CM | POA: Diagnosis not present

## 2013-12-29 LAB — CBC WITH DIFFERENTIAL/PLATELET
Basophils Absolute: 0 10*3/uL (ref 0.0–0.1)
Basophils Relative: 0 % (ref 0–1)
EOS ABS: 0 10*3/uL (ref 0.0–0.7)
Eosinophils Relative: 0 % (ref 0–5)
HCT: 23.8 % — ABNORMAL LOW (ref 36.0–46.0)
HEMOGLOBIN: 7.8 g/dL — AB (ref 12.0–15.0)
LYMPHS ABS: 1.6 10*3/uL (ref 0.7–4.0)
LYMPHS PCT: 27 % (ref 12–46)
MCH: 34.7 pg — AB (ref 26.0–34.0)
MCHC: 32.8 g/dL (ref 30.0–36.0)
MCV: 105.8 fL — ABNORMAL HIGH (ref 78.0–100.0)
MONOS PCT: 9 % (ref 3–12)
Monocytes Absolute: 0.5 10*3/uL (ref 0.1–1.0)
NEUTROS PCT: 64 % (ref 43–77)
Neutro Abs: 3.7 10*3/uL (ref 1.7–7.7)
Platelets: 232 10*3/uL (ref 150–400)
RBC: 2.25 MIL/uL — AB (ref 3.87–5.11)
RDW: 14.4 % (ref 11.5–15.5)
WBC: 5.9 10*3/uL (ref 4.0–10.5)

## 2013-12-29 LAB — CBG MONITORING, ED: GLUCOSE-CAPILLARY: 184 mg/dL — AB (ref 70–99)

## 2013-12-29 LAB — BASIC METABOLIC PANEL
Anion gap: 16 — ABNORMAL HIGH (ref 5–15)
BUN: 46 mg/dL — AB (ref 6–23)
CO2: 14 meq/L — AB (ref 19–32)
Calcium: 7.3 mg/dL — ABNORMAL LOW (ref 8.4–10.5)
Chloride: 110 mEq/L (ref 96–112)
Creatinine, Ser: 2.79 mg/dL — ABNORMAL HIGH (ref 0.50–1.10)
GFR calc Af Amer: 21 mL/min — ABNORMAL LOW (ref >90)
GFR calc non Af Amer: 18 mL/min — ABNORMAL LOW (ref >90)
GLUCOSE: 172 mg/dL — AB (ref 70–99)
Potassium: 3.6 mEq/L — ABNORMAL LOW (ref 3.7–5.3)
Sodium: 140 mEq/L (ref 137–147)

## 2013-12-29 LAB — APTT: aPTT: 20 seconds — ABNORMAL LOW (ref 24–37)

## 2013-12-29 LAB — PROTIME-INR
INR: 0.95 (ref 0.00–1.49)
Prothrombin Time: 12.7 seconds (ref 11.6–15.2)

## 2013-12-29 MED ORDER — DIPHENHYDRAMINE HCL 25 MG PO CAPS
25.0000 mg | ORAL_CAPSULE | Freq: Once | ORAL | Status: AC
  Administered 2013-12-29: 25 mg via ORAL
  Filled 2013-12-29: qty 1

## 2013-12-29 MED ORDER — DIPHENHYDRAMINE HCL 50 MG/ML IJ SOLN: 25.0000 mg | Freq: Once | INTRAMUSCULAR | Status: DC

## 2013-12-29 NOTE — ED Provider Notes (Cosign Needed)
CSN: [id number]     Arrival date & time 12/29/13  4098 History   First MD Initiated Contact with Patient 12/29/13 [phone]     Chief Complaint  Patient presents with  . Oral Swelling     (Consider location/radiation/quality/duration/timing/severity/associated sxs/prior Treatment) HPI Michelle [name] Is a 53 year old female with a past medical history of HIV, CHF, hepatitis C, type 2 diabetes, autoimmune hemolytic anemia, hypertension, cirrhosis, and end-stage renal disease who presents via EMS with mass in his throat and hemoptysis. History is taken by the patient and EMS. The patient complains of a swelling on the roof of her mouth. It was not there yesterday. She states when she woke up this morning she began coughing and noticed hemoptysis. She states she had one episode of gagging after cough. It has been growing enterococcus and a napkin because she feels as if she is going to gag when she swallows. She denies any difficulty breathing, sore throat. She denies any recent fevers, chills, myalgias. She states she is not on dialysis although she has a history of end-stage renal disease. Review of her chart shows attempt at AV fistula placement in February of 2015 without success.  Past Medical History  Diagnosis Date  . Stroke   . Meningitis   . HIV (human immunodeficiency virus infection) 1981  . Hypertension   . Gout   . Muscle spasms of head and/or neck   . CHF (congestive heart failure)     [name] 06/18/2013  . HCV (hepatitis C virus)     chronic/notes 06/18/2013  . Type II diabetes mellitus     [name] 06/18/2013  . AIHA (autoimmune hemolytic anemia)     [name] 06/18/2013  . Hypertensive encephalopathy ~ 05/2013    hospitalaized/notes 06/18/2013  . Exertional shortness of breath     [name] 06/18/2013  . Anxiety     [name] 06/18/2013  . History of syphilis   . High cholesterol   . CKD (chronic kidney disease), stage III   . Nephrotic syndrome   . Cirrhosis     hepatitis C   . GERD  (gastroesophageal reflux disease)   . Migraine     "all the time" (11/27/2013)  . Seizures     "last sz was week before last" (11/27/2013)  . Psychosis   . Chronic venous stasis dermatitis of both lower extremities    Past Surgical History  Procedure Laterality Date  . Hip pinning Right 1980's  . Av fistula placement Right 07/24/2013    Procedure: RIGHT arm exploration of antecubital space;  Surgeon: Sherren Kerns, MD;  Location: [facility] OR;  Service: Vascular;  Laterality: Right;  . Portacath placement  11/27/2013  . Colonoscopy N/A 12/19/2013    Procedure: COLONOSCOPY;  Surgeon: Iva Boop, MD;  Location[facility] ENDOSCOPY;  Service: Endoscopy;  Laterality: N/A;  . Esophagogastroduodenoscopy N/A 12/19/2013    Procedure: ESOPHAGOGASTRODUODENOSCOPY (EGD);  Surgeon: Iva Boop, MD;  Location: [facility] ENDOSCOPY;  Service: Endoscopy;  Laterality: N/A;   Family History  Problem Relation Age of Onset  . Cancer - Colon Mother   . Cancer Father   . Hypertension Father   . Diabetes    . Diabetes Sister    History  Substance Use Topics  . Smoking status: Former Smoker    Types: Cigarettes    Quit date: 06/19/2010  . Smokeless tobacco: Never Used  . Alcohol Use: No   OB History   Grav Para Term Preterm Abortions TAB SAB Ect Mult  Living                 Review of Systems  Ten systems reviewed and are negative for acute change, except as noted in the HPI.    Allergies  Ceftriaxone; Morphine and related; and Norvasc  Home Medications   Prior to Admission medications   Medication Sig Start Date End Date Taking? Authorizing Provider  abacavir (ZIAGEN) 300 MG tablet Take 300 mg by mouth 2 (two) times daily.    Historical Provider, MD  acidophilus (RISAQUAD) CAPS capsule Take 2 capsules by mouth daily. 12/21/13   Lorenda HatchetAdam L Rothman, MD  acyclovir (ZOVIRAX) 800 MG tablet Take 800 mg by mouth daily.     Historical Provider, MD  aspirin EC 81 MG tablet Take 81 mg by mouth daily.    Historical  Provider, MD  cloNIDine (CATAPRES) 0.3 MG tablet Take 0.3 mg by mouth 3 (three) times daily.    Historical Provider, MD  Darunavir Ethanolate (PREZISTA) 800 MG tablet Take 800 mg by mouth daily with breakfast.    Historical Provider, MD  diphenoxylate-atropine (LOMOTIL) 2.5-0.025 MG per tablet Take 1 tablet by mouth 4 (four) times daily -  before meals and at bedtime. 12/21/13   Lorenda HatchetAdam L Rothman, MD  furosemide (LASIX) 80 MG tablet Take 80 mg by mouth 2 (two) times daily.    Historical Provider, MD  hydrALAZINE (APRESOLINE) 50 MG tablet Take 50 mg by mouth 3 (three) times daily.    Historical Provider, MD  hydrOXYzine (ATARAX/VISTARIL) 10 MG tablet Take 1 tablet (10 mg total) by mouth 3 (three) times daily as needed for itching. 12/24/13   Evelena PeatAlex Wilson, DO  insulin aspart (NOVOLOG) 100 UNIT/ML injection Inject 10-25 Units into the skin 3 (three) times daily before meals. Based on sliding scale    Historical Provider, MD  insulin glargine (LANTUS) 100 UNIT/ML injection Inject 0.3 mLs (30 Units total) into the skin at bedtime. 10/14/13   Annett Gularacy N McLean, MD  labetalol (NORMODYNE) 200 MG tablet Take 400 mg by mouth 2 (two) times daily.    Historical Provider, MD  lamiVUDine (EPIVIR) 150 MG tablet Take 150 mg by mouth daily.    Historical Provider, MD  levETIRAcetam (KEPPRA) 500 MG tablet Take 1,000 mg by mouth 2 (two) times daily.     Historical Provider, MD  lisinopril (PRINIVIL,ZESTRIL) 40 MG tablet Take 1 tablet (40 mg total) by mouth daily. 09/04/13   Rocco SereneLawrence D Klima, MD  Multiple Vitamin (MULTIVITAMIN WITH MINERALS) TABS tablet Take 1 tablet by mouth daily.    Historical Provider, MD  pantoprazole (PROTONIX) 40 MG tablet Take 1 tablet (40 mg total) by mouth daily. 06/02/13   Vivi BarrackSarah Cater, MD  phenytoin (DILANTIN) 100 MG ER capsule Take 300-400 mg by mouth 2 (two) times daily. 3 (300mg ) tablets in the morning and 4 (400mg ) tablets in the evening    Historical Provider, MD  potassium chloride (K-DUR,KLOR-CON)  10 MEQ tablet Take 1 tablet (10 mEq total) by mouth daily. 10/14/13   Annett Gularacy N McLean, MD  ritonavir (NORVIR) 100 MG capsule Take 100 mg by mouth daily with breakfast.    Historical Provider, MD  sodium bicarbonate 650 MG tablet Take 1 tablet (650 mg total) by mouth 2 (two) times daily. 12/24/13   Evelena PeatAlex Wilson, DO   BP 177/91  Pulse 87  Temp(Src) 97.4 F (36.3 C) (Oral)  Resp 16  SpO2 100% Physical Exam  Nursing note and vitals reviewed. Constitutional: She is oriented to  person, place, and time. She appears well-developed and well-nourished. No distress.  Appears chronically ill  HENT:  Head: Normocephalic and atraumatic.  Mouth/Throat: Uvula is midline and oropharynx is clear and moist.    Moon facies  Eyes: Conjunctivae are normal. No scleral icterus.  Neck: Normal range of motion. No JVD present.  Cardiovascular: Normal rate, regular rhythm and normal heart sounds.  Exam reveals no gallop and no friction rub.   No murmur heard. Pulmonary/Chest: Effort normal and breath sounds normal. No respiratory distress. She has no wheezes.  NO stridor  Abdominal: Soft. Bowel sounds are normal. She exhibits no distension and no mass. There is no tenderness. There is no guarding.  Neurological: She is alert and oriented to person, place, and time.  Skin: Skin is warm and dry. No rash noted. She is not diaphoretic.    ED Course  Procedures (including critical care time) Labs Review Labs Reviewed  CBC WITH DIFFERENTIAL  BASIC METABOLIC PANEL  PROTIME-INR  APTT  CBG MONITORING, ED    Imaging Review No results found.   EKG Interpretation None      MDM   Final diagnoses:  Blood blister    9:03 AM BP 177/91  Pulse 87  Temp(Src) 97.4 F (36.3 C) (Oral)  Resp 16  SpO2 100% Patient seen in shared visit with Dr. Ethelda Chick.  Bullous eruption on hard palate. She is able to swallow and can tolerated secretions without gagging. No stridor. She states she couldn't take her  medication this morning. We'll obtain a CT of the neck without contrast due to her kidney function, Basic labs, coag studies. Patient is currently drinking fluids.    10:41 AM BP 177/91  Pulse 87  Temp(Src) 97.4 F (36.3 C) (Oral)  Resp 16  SpO2 100% Patient labs appear at baseline. Normal coags and platelets Her ct shows the bullous lesion visualized. No other signs of airway narrowing. She complains of itching. Will give the patient Benadryl. ENT consult presently.  10:50 AM BP 177/91  Pulse 87  Temp(Src) 97.4 F (36.3 C) (Oral)  Resp 16  SpO2 100% I have spoken with Dr. Pollyann Kennedy. He states that most likely this represents a viral eruption.  He does not recommend draining the bulla.   Patient is tolerating fluids. She was able to take oral benadrly tablet and she drank >16 oz of fluid.  02 sats 100% on RA. Safe for dc. F/u with [place] [facility] this week.   Arthor Captain, PA-C 12/30/13 1858

## 2013-12-29 NOTE — ED Notes (Signed)
Pt arrived from home by [facility] with c/o swelling to the roof of her mouth and cough with a slight amount of blood in sputum. Pt is able to swallow but is drooling and stated that she is able to talk a little bit. Stated "feels like something hanging"  There is a blood blister to the roof of mouth where the soft palate and hard palate meet. Denies any pain or SOB. Lungs clear. Pt does take Lisinopril but has been on medication for years.

## 2013-12-29 NOTE — Discharge Instructions (Signed)
There is a blood blister on your palate in your mouth. This is most likely from a viral infection. You will need to follow up with the ENT doctor as soon as possible. Return to the ED if develop difficulty breathing or you cannot swallow. Take your normal medications when you get home today.   Blisters Blisters are fluid-filled sacs that form within the skin. Common causes of blistering are friction, burns, and exposure to irritating chemicals. The fluid in the blister protects the underlying damaged skin. Most of the time it is not recommended that you open blisters. When a blister is opened, there is an increased chance for infection. Usually, a blister will open on its own. They then dry up and peel off within 10 days. If the blister is tense and uncomfortable (painful) the fluid may be drained. If it is drained the roof of the blister should be left intact. The draining should only be done by a medical professional under aseptic conditions. Poorly fitting shoes and boots can cause blisters by being too tight or too loose. Wearing extra socks or using tape, bandages, or pads over the blister-prone area helps prevent the problem by reducing friction. Blisters heal more slowly if you have diabetes or if you have problems with your circulation. You need to be careful about medical follow-up to prevent infection. HOME CARE INSTRUCTIONS  Protect areas where blisters have formed until the skin is healed. Use a special bandage with a hole cut in the middle around the blister. This reduces pressure and friction. When the blister breaks, trim off the loose skin and keep the area clean by washing it with soap daily. Soaking the blister or broken-open blister with diluted vinegar twice daily for 15 minutes will dry it up and speed the healing. Use 3 tablespoons of white vinegar per quart of water (45 mL white vinegar per liter of water). An antibiotic ointment and a bandage can be used to cover the area after  soaking.  SEEK MEDICAL CARE IF:   You develop increased redness, pain, swelling, or drainage in the blistered area.  You develop a pus-like discharge from the blistered area, chills, or a fever. MAKE SURE YOU:   Understand these instructions.  Will watch your condition.  Will get help right away if you are not doing well or get worse. Document Released: 06/23/2004 Document Revised: 08/08/2011 Document Reviewed: 05/21/2008 [facility] Patient Information 2015 [place], [place]. This information is not intended to replace advice given to you by your health care provider. Make sure you discuss any questions you have with your health care provider.

## 2013-12-29 NOTE — ED Provider Notes (Signed)
Complains of swollen area of oropharynx onset upon awakening smoking. Patient also complains of vague discomfort in her left anterior neck. She denies shortness of breath denies pain anywhere. On exam patient is alert nontoxic speech is somewhat garbled but when distracted speech is clear. She is handling secretions well Oropharynx shows poor dentition, there is a 1 cm diameter bulla overlying the palate neck is supple no lymphadenopathy no masses trachea midline lungs clear auscultation  Doug Sou, MD 12/29/13 1816

## 2013-12-30 NOTE — ED Provider Notes (Signed)
CSN: [id number]     Arrival date & time 12/24/13  0502 History   First MD Initiated Contact with Patient 12/24/13 0541     Chief Complaint  Patient presents with  . Dysuria  . Groin Swelling     (Consider location/radiation/quality/duration/timing/severity/associated sxs/prior Treatment) Patient is a 53 y.o. female presenting with rash. The history is provided by the patient. No language interpreter was used.  Rash Associated symptoms: no fever and no myalgias   Associated symptoms comment:  The patient has a chronic rash with chronic itching that is worse today. No new symptoms. No fever. No change in medications. She has taken Benadryl, Atarax without relief.    Past Medical History  Diagnosis Date  . Stroke   . Meningitis   . HIV (human immunodeficiency virus infection) 1981  . Hypertension   . Gout   . Muscle spasms of head and/or neck   . CHF (congestive heart failure)     [name]/notes 06/18/2013  . HCV (hepatitis C virus)     chronic/notes 06/18/2013  . Type II diabetes mellitus     [name]/notes 06/18/2013  . AIHA (autoimmune hemolytic anemia)     [name]/notes 06/18/2013  . Hypertensive encephalopathy ~ 05/2013    hospitalaized/notes 06/18/2013  . Exertional shortness of breath     [name]/notes 06/18/2013  . Anxiety     [name]/notes 06/18/2013  . History of syphilis   . High cholesterol   . CKD (chronic kidney disease), stage III   . Nephrotic syndrome   . Cirrhosis     hepatitis C   . GERD (gastroesophageal reflux disease)   . Migraine     "all the time" (11/27/2013)  . Seizures     "last sz was week before last" (11/27/2013)  . Psychosis   . Chronic venous stasis dermatitis of both lower extremities    Past Surgical History  Procedure Laterality Date  . Hip pinning Right 1980's  . Av fistula placement Right 07/24/2013    Procedure: RIGHT arm exploration of antecubital space;  Surgeon: Sherren Kernsharles E Fields, MD;  [facility];  Service: Vascular;  Laterality: Right;  . Portacath placement   11/27/2013  . Colonoscopy N/A 12/19/2013    Procedure: COLONOSCOPY;  Surgeon: Iva Booparl E Gessner, MD;  [facility];  Service: Endoscopy;  Laterality: N/A;  . Esophagogastroduodenoscopy N/A 12/19/2013    Procedure: ESOPHAGOGASTRODUODENOSCOPY (EGD);  Surgeon: Iva Booparl E Gessner, MD;  [facility];  Service: Endoscopy;  Laterality: N/A;   Family History  Problem Relation Age of Onset  . Cancer - Colon Mother   . Cancer Father   . Hypertension Father   . Diabetes    . Diabetes Sister    History  Substance Use Topics  . Smoking status: Former Smoker    Types: Cigarettes    Quit date: 06/19/2010  . Smokeless tobacco: Never Used  . Alcohol Use: No   OB History   Grav Para Term Preterm Abortions TAB SAB Ect Mult Living                 Review of Systems  Constitutional: Negative for fever and chills.  Musculoskeletal: Negative.  Negative for myalgias.  Skin: Positive for rash.  Neurological: Negative.       Allergies  Ceftriaxone; Morphine and related; and Norvasc  Home Medications   Prior to Admission medications   Medication Sig Start Date End Date Taking? Authorizing Provider  abacavir (ZIAGEN) 300 MG tablet Take 300 mg by mouth  2 (two) times daily.   Yes Historical Provider, MD  acidophilus (RISAQUAD) CAPS capsule Take 2 capsules by mouth daily. 12/21/13  Yes Lorenda Hatchet, MD  acyclovir (ZOVIRAX) 800 MG tablet Take 800 mg by mouth daily.    Yes Historical Provider, MD  aspirin EC 81 MG tablet Take 81 mg by mouth daily.   Yes Historical Provider, MD  cloNIDine (CATAPRES) 0.3 MG tablet Take 0.3 mg by mouth 3 (three) times daily.   Yes Historical Provider, MD  Darunavir Ethanolate (PREZISTA) 800 MG tablet Take 800 mg by mouth daily with breakfast.   Yes Historical Provider, MD  diphenoxylate-atropine (LOMOTIL) 2.5-0.025 MG per tablet Take 1 tablet by mouth 4 (four) times daily -  before meals and at bedtime. 12/21/13  Yes Lorenda Hatchet, MD  furosemide (LASIX) 80 MG  tablet Take 80 mg by mouth 2 (two) times daily.   Yes Historical Provider, MD  hydrALAZINE (APRESOLINE) 50 MG tablet Take 50 mg by mouth 3 (three) times daily.   Yes Historical Provider, MD  insulin aspart (NOVOLOG) 100 UNIT/ML injection Inject 10-25 Units into the skin 3 (three) times daily before meals. Based on sliding scale   Yes Historical Provider, MD  insulin glargine (LANTUS) 100 UNIT/ML injection Inject 0.3 mLs (30 Units total) into the skin at bedtime. 10/14/13  Yes Annett Gula, MD  labetalol (NORMODYNE) 200 MG tablet Take 400 mg by mouth 2 (two) times daily.   Yes Historical Provider, MD  lamiVUDine (EPIVIR) 150 MG tablet Take 150 mg by mouth daily.   Yes Historical Provider, MD  levETIRAcetam (KEPPRA) 500 MG tablet Take 1,000 mg by mouth 2 (two) times daily.    Yes Historical Provider, MD  lisinopril (PRINIVIL,ZESTRIL) 40 MG tablet Take 1 tablet (40 mg total) by mouth daily. 09/04/13  Yes Rocco Serene, MD  Multiple Vitamin (MULTIVITAMIN WITH MINERALS) TABS tablet Take 1 tablet by mouth daily.   Yes Historical Provider, MD  pantoprazole (PROTONIX) 40 MG tablet Take 1 tablet (40 mg total) by mouth daily. 06/02/13  Yes Vivi Barrack, MD  phenytoin (DILANTIN) 100 MG ER capsule Take 300-400 mg by mouth 2 (two) times daily. 3 (300mg ) tablets in the morning and 4 (400mg ) tablets in the evening   Yes Historical Provider, MD  potassium chloride (K-DUR,KLOR-CON) 10 MEQ tablet Take 1 tablet (10 mEq total) by mouth daily. 10/14/13  Yes Annett Gula, MD  ritonavir (NORVIR) 100 MG capsule Take 100 mg by mouth daily with breakfast.   Yes Historical Provider, MD  hydrOXYzine (ATARAX/VISTARIL) 10 MG tablet Take 1 tablet (10 mg total) by mouth 3 (three) times daily as needed for itching. 12/24/13   Evelena Peat, DO  sodium bicarbonate 650 MG tablet Take 1 tablet (650 mg total) by mouth 2 (two) times daily. 12/24/13   Evelena Peat, DO   BP 178/79  Pulse 74  Temp(Src) 98.1 F (36.7 C) (Oral)  Resp 17  SpO2  100% Physical Exam  Constitutional: She appears well-developed and well-nourished. No distress.  Skin:  There are multiple hyperpigmented singular lesions to lower extremities and upper extremities. Excoriation present generally. No redness, drainage, pustules or blisters.     ED Course  Procedures (including critical care time) Labs Review Labs Reviewed  URINALYSIS, ROUTINE W REFLEX MICROSCOPIC - Abnormal; Notable for the following:    Glucose, UA 100 (*)    Hgb urine dipstick TRACE (*)    Protein, ur >300 (*)    All other components  within normal limits  CBC WITH DIFFERENTIAL - Abnormal; Notable for the following:    RBC 1.93 (*)    Hemoglobin 6.9 (*)    HCT 21.0 (*)    MCV 108.8 (*)    MCH 35.8 (*)    Monocytes Relative 17 (*)    Monocytes Absolute 1.1 (*)    All other components within normal limits  COMPREHENSIVE METABOLIC PANEL - Abnormal; Notable for the following:    CO2 12 (*)    Glucose, Bld 218 (*)    BUN 48 (*)    Creatinine, Ser 2.74 (*)    Calcium 7.5 (*)    Total Protein 5.7 (*)    Albumin 2.0 (*)    AST 66 (*)    ALT 54 (*)    Alkaline Phosphatase 497 (*)    GFR calc non Af Amer 19 (*)    GFR calc Af Amer 21 (*)    Anion gap 19 (*)    All other components within normal limits  URINE MICROSCOPIC-ADD ON - Abnormal; Notable for the following:    Bacteria, UA FEW (*)    All other components within normal limits    Imaging Review Ct Soft Tissue Neck Wo Contrast  12/29/2013   CLINICAL DATA:  53 year old HIV positive female with palatal swelling, drooling and decreased ability to swallow.  EXAM: CT NECK WITHOUT CONTRAST  TECHNIQUE: Multidetector CT imaging of the neck was performed following the standard protocol without intravenous contrast.  COMPARISON:  Prior head CT 10/23/2013  FINDINGS: Extremely limited evaluation in the absence of intravenous contrast. The visualized intracranial contents demonstrate no acute abnormality. The visualized paranasal sinuses  and mastoid air cells are normally aerated. Evaluation of the palate is limited by streak artifact related to multiple dental amalgams.  High palatal arch. There is some asymmetric soft tissue attenuation material low just left of midline at the junction of the heart and soft palate. In the absence image venous contrast material this is very nonspecific. This could represent tenacious protein ridge secretions, blood products, or a soft tissue mass. This region should be amenable to direct inspection. No invasion into the surrounding soft tissues. Otherwise, the palatal and faucial tonsils are symmetric. The uvula is unremarkable as is the epiglottis and airway. Unremarkable parotid and salivary glands. No focal soft tissue fluid collection. Right IJ approach port catheter incompletely imaged. No suspicious adenopathy.  Unremarkable upper lungs.  IMPRESSION: 1. Nonspecific 1.5 x 0.8 cm soft tissue attenuation structure exophytic from the soft palate just left of midline. In the absence of intravenous contrast for complete evaluation this could represent proteinaceous secretions, blood products or a soft tissue mass. This region should be amenable to direct visualization. No evidence of invasion into the adjacent soft tissues or bony structures. 2. Otherwise, unremarkable noncontrast appearance of the soft tissues of the neck. 3. Incompletely imaged right IJ approach portacatheter.   Electronically Signed   By: Malachy Moan M.D.   On: 12/29/2013 10:33     EKG Interpretation None      MDM   Final diagnoses:  Itching    Patient is given medications with some relief of itching. She is encouraged to follow up with her doctor per already scheduled appointment in the [facility]. She is discharged and will make that appointment.     Arnoldo Hooker, PA-C 12/30/13 1449

## 2013-12-31 ENCOUNTER — Telehealth: Payer: Self-pay | Admitting: *Deleted

## 2013-12-31 NOTE — Telephone Encounter (Signed)
HHN informed and will see pt on Thursday.  She will work with sister on taking medication as instructed. Will call back for elevated BP's. Pt was seen in ED and referred to ENT, nurse will call and schedule that appointment.

## 2013-12-31 NOTE — Telephone Encounter (Signed)
Call from [facility] RN with [facility] - [phone]  Nurse calls to report BP 195/92, ( last office BP 140/100) she has all her meds but labetalol and has been out for 4 - 5 days.  She now has this med.  Her CBG's range from 69 - 550.   Today before BK 524, before lunch 401 Nurse advised pt to check sugars before meals, she will review all meds and diet with pt.  Pt has scheduled appointment in clinic on 8/11 Since pt just got Labetalol should we just wait until next week to see pt?   Sister's name is  Misty Stanley # [phone] Sister has moved in to help patient

## 2013-12-31 NOTE — Telephone Encounter (Signed)
Review of BP shows that it is all over the place - up and down. So the elevated BP is not new. Therefore, OK to wait until the 11th to F/U on all her meds. Cont to check BP at home and to call if increases

## 2014-01-02 ENCOUNTER — Telehealth: Payer: Self-pay | Admitting: *Deleted

## 2014-01-02 NOTE — Telephone Encounter (Signed)
Please ignore this, HHN gave wrong pt's info

## 2014-01-02 NOTE — Telephone Encounter (Signed)
HHN calls and states pt needs home social work consult to aid in financial assistance, she was given verbal ok, do you approve?

## 2014-01-03 ENCOUNTER — Encounter (HOSPITAL_COMMUNITY): Payer: Self-pay | Admitting: Emergency Medicine

## 2014-01-03 ENCOUNTER — Inpatient Hospital Stay (HOSPITAL_COMMUNITY)
Admission: EM | Admit: 2014-01-03 | Discharge: 2014-01-15 | DRG: 975 | Disposition: A | Discharge status: Skilled Nursing Facility | Payer: Medicaid Other | Attending: Internal Medicine | Admitting: Internal Medicine

## 2014-01-03 DIAGNOSIS — K838 Other specified diseases of biliary tract: Secondary | ICD-10-CM | POA: Diagnosis not present

## 2014-01-03 DIAGNOSIS — K7290 Hepatic failure, unspecified without coma: Secondary | ICD-10-CM | POA: Diagnosis present

## 2014-01-03 DIAGNOSIS — K7460 Unspecified cirrhosis of liver: Secondary | ICD-10-CM | POA: Diagnosis present

## 2014-01-03 DIAGNOSIS — A4902 Methicillin resistant Staphylococcus aureus infection, unspecified site: Secondary | ICD-10-CM | POA: Diagnosis present

## 2014-01-03 DIAGNOSIS — Z7982 Long term (current) use of aspirin: Secondary | ICD-10-CM

## 2014-01-03 DIAGNOSIS — B182 Chronic viral hepatitis C: Secondary | ICD-10-CM | POA: Diagnosis present

## 2014-01-03 DIAGNOSIS — K7682 Hepatic encephalopathy: Secondary | ICD-10-CM | POA: Diagnosis present

## 2014-01-03 DIAGNOSIS — G43909 Migraine, unspecified, not intractable, without status migrainosus: Secondary | ICD-10-CM | POA: Diagnosis present

## 2014-01-03 DIAGNOSIS — I872 Venous insufficiency (chronic) (peripheral): Secondary | ICD-10-CM | POA: Diagnosis present

## 2014-01-03 DIAGNOSIS — Z87891 Personal history of nicotine dependence: Secondary | ICD-10-CM

## 2014-01-03 DIAGNOSIS — A4101 Sepsis due to Methicillin susceptible Staphylococcus aureus: Principal | ICD-10-CM | POA: Diagnosis present

## 2014-01-03 DIAGNOSIS — I959 Hypotension, unspecified: Secondary | ICD-10-CM | POA: Diagnosis not present

## 2014-01-03 DIAGNOSIS — N049 Nephrotic syndrome with unspecified morphologic changes: Secondary | ICD-10-CM | POA: Diagnosis present

## 2014-01-03 DIAGNOSIS — Z794 Long term (current) use of insulin: Secondary | ICD-10-CM

## 2014-01-03 DIAGNOSIS — E8729 Other acidosis: Secondary | ICD-10-CM | POA: Diagnosis present

## 2014-01-03 DIAGNOSIS — E872 Acidosis, unspecified: Secondary | ICD-10-CM | POA: Diagnosis present

## 2014-01-03 DIAGNOSIS — E7800 Pure hypercholesterolemia, unspecified: Secondary | ICD-10-CM | POA: Diagnosis present

## 2014-01-03 DIAGNOSIS — R400 Somnolence: Secondary | ICD-10-CM

## 2014-01-03 DIAGNOSIS — A419 Sepsis, unspecified organism: Secondary | ICD-10-CM | POA: Diagnosis present

## 2014-01-03 DIAGNOSIS — R188 Other ascites: Secondary | ICD-10-CM | POA: Diagnosis present

## 2014-01-03 DIAGNOSIS — Z888 Allergy status to other drugs, medicaments and biological substances status: Secondary | ICD-10-CM

## 2014-01-03 DIAGNOSIS — M109 Gout, unspecified: Secondary | ICD-10-CM | POA: Diagnosis present

## 2014-01-03 DIAGNOSIS — E876 Hypokalemia: Secondary | ICD-10-CM | POA: Diagnosis present

## 2014-01-03 DIAGNOSIS — B20 Human immunodeficiency virus [HIV] disease: Secondary | ICD-10-CM | POA: Diagnosis present

## 2014-01-03 DIAGNOSIS — K219 Gastro-esophageal reflux disease without esophagitis: Secondary | ICD-10-CM | POA: Diagnosis present

## 2014-01-03 DIAGNOSIS — I639 Cerebral infarction, unspecified: Secondary | ICD-10-CM

## 2014-01-03 DIAGNOSIS — Z8673 Personal history of transient ischemic attack (TIA), and cerebral infarction without residual deficits: Secondary | ICD-10-CM

## 2014-01-03 DIAGNOSIS — R159 Full incontinence of feces: Secondary | ICD-10-CM | POA: Diagnosis not present

## 2014-01-03 DIAGNOSIS — J029 Acute pharyngitis, unspecified: Secondary | ICD-10-CM | POA: Diagnosis not present

## 2014-01-03 DIAGNOSIS — R4182 Altered mental status, unspecified: Secondary | ICD-10-CM | POA: Diagnosis present

## 2014-01-03 DIAGNOSIS — I10 Essential (primary) hypertension: Secondary | ICD-10-CM

## 2014-01-03 DIAGNOSIS — D591 Autoimmune hemolytic anemia, unspecified: Secondary | ICD-10-CM

## 2014-01-03 DIAGNOSIS — F411 Generalized anxiety disorder: Secondary | ICD-10-CM | POA: Diagnosis present

## 2014-01-03 DIAGNOSIS — Z6829 Body mass index (BMI) 29.0-29.9, adult: Secondary | ICD-10-CM

## 2014-01-03 DIAGNOSIS — E46 Unspecified protein-calorie malnutrition: Secondary | ICD-10-CM

## 2014-01-03 DIAGNOSIS — B373 Candidiasis of vulva and vagina: Secondary | ICD-10-CM | POA: Diagnosis not present

## 2014-01-03 DIAGNOSIS — R569 Unspecified convulsions: Secondary | ICD-10-CM | POA: Diagnosis present

## 2014-01-03 DIAGNOSIS — R410 Disorientation, unspecified: Secondary | ICD-10-CM

## 2014-01-03 DIAGNOSIS — I129 Hypertensive chronic kidney disease with stage 1 through stage 4 chronic kidney disease, or unspecified chronic kidney disease: Secondary | ICD-10-CM | POA: Diagnosis present

## 2014-01-03 DIAGNOSIS — R7881 Bacteremia: Secondary | ICD-10-CM | POA: Diagnosis present

## 2014-01-03 DIAGNOSIS — B958 Unspecified staphylococcus as the cause of diseases classified elsewhere: Secondary | ICD-10-CM | POA: Diagnosis present

## 2014-01-03 DIAGNOSIS — R1084 Generalized abdominal pain: Secondary | ICD-10-CM

## 2014-01-03 DIAGNOSIS — B3731 Acute candidiasis of vulva and vagina: Secondary | ICD-10-CM | POA: Diagnosis not present

## 2014-01-03 DIAGNOSIS — T80218A Other infection due to central venous catheter, initial encounter: Secondary | ICD-10-CM | POA: Diagnosis present

## 2014-01-03 DIAGNOSIS — Y849 Medical procedure, unspecified as the cause of abnormal reaction of the patient, or of later complication, without mention of misadventure at the time of the procedure: Secondary | ICD-10-CM | POA: Diagnosis present

## 2014-01-03 DIAGNOSIS — N184 Chronic kidney disease, stage 4 (severe): Secondary | ICD-10-CM | POA: Diagnosis present

## 2014-01-03 DIAGNOSIS — R509 Fever, unspecified: Secondary | ICD-10-CM | POA: Diagnosis present

## 2014-01-03 DIAGNOSIS — I509 Heart failure, unspecified: Secondary | ICD-10-CM | POA: Diagnosis present

## 2014-01-03 DIAGNOSIS — E119 Type 2 diabetes mellitus without complications: Secondary | ICD-10-CM | POA: Diagnosis present

## 2014-01-03 DIAGNOSIS — Z8661 Personal history of infections of the central nervous system: Secondary | ICD-10-CM

## 2014-01-03 DIAGNOSIS — D638 Anemia in other chronic diseases classified elsewhere: Secondary | ICD-10-CM | POA: Diagnosis present

## 2014-01-03 DIAGNOSIS — R197 Diarrhea, unspecified: Secondary | ICD-10-CM

## 2014-01-03 DIAGNOSIS — B1920 Unspecified viral hepatitis C without hepatic coma: Secondary | ICD-10-CM

## 2014-01-03 DIAGNOSIS — N179 Acute kidney failure, unspecified: Secondary | ICD-10-CM | POA: Diagnosis present

## 2014-01-03 LAB — RAPID URINE DRUG SCREEN, HOSP PERFORMED
Amphetamines: NOT DETECTED
Barbiturates: NOT DETECTED
Benzodiazepines: NOT DETECTED
Cocaine: NOT DETECTED
Opiates: NOT DETECTED
TETRAHYDROCANNABINOL: NOT DETECTED

## 2014-01-03 LAB — URINALYSIS, ROUTINE W REFLEX MICROSCOPIC
BILIRUBIN URINE: NEGATIVE
Glucose, UA: 100 mg/dL — AB
HGB URINE DIPSTICK: NEGATIVE
Ketones, ur: NEGATIVE mg/dL
Leukocytes, UA: NEGATIVE
NITRITE: NEGATIVE
Specific Gravity, Urine: 1.01 (ref 1.005–1.030)
UROBILINOGEN UA: 1 mg/dL (ref 0.0–1.0)
pH: 6 (ref 5.0–8.0)

## 2014-01-03 LAB — COMPREHENSIVE METABOLIC PANEL
ALT: 55 U/L — ABNORMAL HIGH (ref 0–35)
ANION GAP: 15 (ref 5–15)
AST: 61 U/L — ABNORMAL HIGH (ref 0–37)
Albumin: 2 g/dL — ABNORMAL LOW (ref 3.5–5.2)
Alkaline Phosphatase: 500 U/L — ABNORMAL HIGH (ref 39–117)
BILIRUBIN TOTAL: 1.5 mg/dL — AB (ref 0.3–1.2)
BUN: 63 mg/dL — AB (ref 6–23)
CHLORIDE: 107 meq/L (ref 96–112)
CO2: 17 mEq/L — ABNORMAL LOW (ref 19–32)
Calcium: 6.7 mg/dL — ABNORMAL LOW (ref 8.4–10.5)
Creatinine, Ser: 2.44 mg/dL — ABNORMAL HIGH (ref 0.50–1.10)
GFR calc non Af Amer: 21 mL/min — ABNORMAL LOW (ref >90)
GFR, EST AFRICAN AMERICAN: 25 mL/min — AB (ref >90)
GLUCOSE: 344 mg/dL — AB (ref 70–99)
Potassium: 3.8 mEq/L (ref 3.7–5.3)
Sodium: 139 mEq/L (ref 137–147)
TOTAL PROTEIN: 5.4 g/dL — AB (ref 6.0–8.3)

## 2014-01-03 LAB — CBC
HEMATOCRIT: 24.6 % — AB (ref 36.0–46.0)
Hemoglobin: 7.8 g/dL — ABNORMAL LOW (ref 12.0–15.0)
MCH: 34.2 pg — ABNORMAL HIGH (ref 26.0–34.0)
MCHC: 31.7 g/dL (ref 30.0–36.0)
MCV: 107.9 fL — AB (ref 78.0–100.0)
Platelets: 171 10*3/uL (ref 150–400)
RBC: 2.28 MIL/uL — ABNORMAL LOW (ref 3.87–5.11)
RDW: 14.3 % (ref 11.5–15.5)
WBC: 4.6 10*3/uL (ref 4.0–10.5)

## 2014-01-03 LAB — CBG MONITORING, ED: Glucose-Capillary: 335 mg/dL — ABNORMAL HIGH (ref 70–99)

## 2014-01-03 LAB — URINE MICROSCOPIC-ADD ON

## 2014-01-03 LAB — ACETAMINOPHEN LEVEL

## 2014-01-03 LAB — I-STAT CG4 LACTIC ACID, ED: Lactic Acid, Venous: 1.27 mmol/L (ref 0.5–2.2)

## 2014-01-03 LAB — LIPASE, BLOOD: Lipase: 116 U/L — ABNORMAL HIGH (ref 11–59)

## 2014-01-03 LAB — SALICYLATE LEVEL: Salicylate Lvl: <2 mg/dL — ABNORMAL LOW (ref 2.8–20.0)

## 2014-01-03 MED ORDER — SODIUM CHLORIDE 0.9 % IV BOLUS (SEPSIS)
1000.0000 mL | Freq: Once | INTRAVENOUS | Status: AC
  Administered 2014-01-03: 1000 mL via INTRAVENOUS

## 2014-01-03 NOTE — ED Notes (Signed)
Pt arrives to ED via EMS from home. pts familly called EMS when family felt she was altered mental status. Upon EMS arrival they found pt lethargic, distended abd, jaundice eyes. Family reports that pt c/o constipation but has been having diarrhea, claim that pt cannot urinate. Pt is a renal failure pt but a shunt cannot be established. Family states that pt has ben reusing insulin needles.

## 2014-01-03 NOTE — ED Provider Notes (Signed)
Medical screening examination/treatment/procedure(s) were performed by non-physician practitioner and as supervising physician I was immediately available for consultation/collaboration.   EKG Interpretation None        Enid Skeens, MD 01/03/14 [phone]

## 2014-01-03 NOTE — ED Provider Notes (Signed)
CSN: [id number]     Arrival date & time 01/03/14  2037 History   First MD Initiated Contact with Patient 01/03/14 2133     Chief Complaint  Patient presents with  . Altered Mental Status   Michelle [name] is a 53 year old AAM who presents today by EMS for AMS. Has a history of HIV on antiretrovirals, CHF, hepatitis C, DM2, and CKD stage III. Family apparently noticed that patient was more obtunded and altered today than normal. They called an ambulance for her. They note that she's had decreased urine output. Family is not here. Patient endorses that she has abdominal pain yesterday having diarrhea since yesterday. Diarrhea is watery. She has not had any nausea or vomiting, fever or chills, SOB, CP, constipation, recent travel, or sick contacts.   (Consider location/radiation/quality/duration/timing/severity/associated sxs/prior Treatment) Patient is a 53 y.o. female presenting with abdominal pain. The history is provided by the patient.  Abdominal Pain Pain location:  Periumbilical, epigastric and RUQ Pain quality: aching   Pain radiates to:  Does not radiate Pain severity:  Severe Onset quality:  Sudden Duration:  1 day Timing:  Constant Progression:  Unchanged Context: not alcohol use, not diet changes, not laxative use, not recent illness, not recent travel, not retching, not sick contacts and not trauma   Relieved by:  Nothing Ineffective treatments:  None tried Associated symptoms: diarrhea   Associated symptoms: no chest pain, no chills, no constipation, no dysuria, no fever, no hematuria, no nausea, no shortness of breath and no vomiting     Past Medical History  Diagnosis Date  . Stroke   . Meningitis   . HIV (human immunodeficiency virus infection) 1981  . Hypertension   . Gout   . Muscle spasms of head and/or neck   . CHF (congestive heart failure)     Michelle [name] 06/18/2013  . HCV (hepatitis C virus)     chronic/notes 06/18/2013  . Type II diabetes mellitus     Michelle [name]  06/18/2013  . AIHA (autoimmune hemolytic anemia)     Michelle [name] 06/18/2013  . Hypertensive encephalopathy ~ 05/2013    hospitalaized/notes 06/18/2013  . Exertional shortness of breath     Michelle [name] 06/18/2013  . Anxiety     Michelle [name] 06/18/2013  . History of syphilis   . High cholesterol   . CKD (chronic kidney disease), stage III   . Nephrotic syndrome   . Cirrhosis     hepatitis C   . GERD (gastroesophageal reflux disease)   . Migraine     "all the time" (11/27/2013)  . Seizures     "last sz was week before last" (11/27/2013)  . Psychosis   . Chronic venous stasis dermatitis of both lower extremities    Past Surgical History  Procedure Laterality Date  . Hip pinning Right 1980's  . Av fistula placement Right 07/24/2013    Procedure: RIGHT arm exploration of antecubital space;  Surgeon: Sherren Kerns, MD;  Location: [facility] OR;  Service: Vascular;  Laterality: Right;  . Portacath placement  11/27/2013  . Colonoscopy N/A 12/19/2013    Procedure: COLONOSCOPY;  Surgeon: Iva Boop, MD;  Location: [facility] ENDOSCOPY;  Service: Endoscopy;  Laterality: N/A;  . Esophagogastroduodenoscopy N/A 12/19/2013    Procedure: ESOPHAGOGASTRODUODENOSCOPY (EGD);  Surgeon: Iva Boop, MD;  Location: [facility] ENDOSCOPY;  Service: Endoscopy;  Laterality: N/A;   Family History  Problem Relation Age of Onset  . Cancer - Colon Mother   . Cancer Father   . Hypertension  Father   . Diabetes    . Diabetes Sister    History  Substance Use Topics  . Smoking status: Former Smoker    Types: Cigarettes    Quit date: 06/19/2010  . Smokeless tobacco: Never Used  . Alcohol Use: No   OB History   Grav Para Term Preterm Abortions TAB SAB Ect Mult Living                 Review of Systems  Unable to perform ROS Constitutional: Negative for fever and chills.  Respiratory: Negative for shortness of breath.   Cardiovascular: Negative for chest pain, palpitations and leg swelling.  Gastrointestinal: Positive for abdominal pain,  diarrhea and abdominal distention. Negative for nausea, vomiting, constipation and blood in stool.  Genitourinary: Negative for dysuria, frequency, hematuria, flank pain and decreased urine volume.  Neurological: Negative for dizziness, speech difficulty, light-headedness and headaches.  All other systems reviewed and are negative.     Allergies  Ceftriaxone; Morphine and related; and Norvasc  Home Medications   Prior to Admission medications   Medication Sig Start Date End Date Taking? Authorizing Provider  abacavir (ZIAGEN) 300 MG tablet Take 300 mg by mouth 2 (two) times daily.    Historical Provider, MD  acidophilus (RISAQUAD) CAPS capsule Take 2 capsules by mouth daily. 12/21/13   Lorenda Hatchet, MD  acyclovir (ZOVIRAX) 800 MG tablet Take 800 mg by mouth daily.     Historical Provider, MD  aspirin EC 81 MG tablet Take 81 mg by mouth daily.    Historical Provider, MD  cloNIDine (CATAPRES) 0.3 MG tablet Take 0.3 mg by mouth 3 (three) times daily.    Historical Provider, MD  Darunavir Ethanolate (PREZISTA) 800 MG tablet Take 800 mg by mouth daily with breakfast.    Historical Provider, MD  diphenoxylate-atropine (LOMOTIL) 2.5-0.025 MG per tablet Take 1 tablet by mouth 4 (four) times daily -  before meals and at bedtime. 12/21/13   Lorenda Hatchet, MD  furosemide (LASIX) 80 MG tablet Take 80 mg by mouth 2 (two) times daily.    Historical Provider, MD  hydrALAZINE (APRESOLINE) 50 MG tablet Take 50 mg by mouth 3 (three) times daily.    Historical Provider, MD  hydrOXYzine (ATARAX/VISTARIL) 10 MG tablet Take 1 tablet (10 mg total) by mouth 3 (three) times daily as needed for itching. 12/24/13   Yolanda Manges, DO  insulin aspart (NOVOLOG) 100 UNIT/ML injection Inject 10-25 Units into the skin 3 (three) times daily before meals. Based on sliding scale    Historical Provider, MD  insulin glargine (LANTUS) 100 UNIT/ML injection Inject 0.3 mLs (30 Units total) into the skin at bedtime. 10/14/13   Annett Gula, MD  labetalol (NORMODYNE) 200 MG tablet Take 400 mg by mouth 2 (two) times daily.    Historical Provider, MD  lamiVUDine (EPIVIR) 150 MG tablet Take 150 mg by mouth daily.    Historical Provider, MD  levETIRAcetam (KEPPRA) 500 MG tablet Take 1,000 mg by mouth 2 (two) times daily.     Historical Provider, MD  lisinopril (PRINIVIL,ZESTRIL) 40 MG tablet Take 1 tablet (40 mg total) by mouth daily. 09/04/13   Rocco Serene, MD  Multiple Vitamin (MULTIVITAMIN WITH MINERALS) TABS tablet Take 1 tablet by mouth daily.    Historical Provider, MD  pantoprazole (PROTONIX) 40 MG tablet Take 1 tablet (40 mg total) by mouth daily. 06/02/13   Vivi Barrack, MD  phenytoin (DILANTIN) 100 MG ER capsule Take  300-400 mg by mouth 2 (two) times daily. 3 (300mg ) tablets in the morning and 4 (400mg ) tablets in the evening    Historical Provider, MD  potassium chloride (K-DUR,KLOR-CON) 10 MEQ tablet Take 1 tablet (10 mEq total) by mouth daily. 10/14/13   Annett Gula, MD  ritonavir (NORVIR) 100 MG capsule Take 100 mg by mouth daily with breakfast.    Historical Provider, MD  sodium bicarbonate 650 MG tablet Take 1 tablet (650 mg total) by mouth 2 (two) times daily. 12/24/13   Yolanda Manges, DO   BP 127/66  Pulse 82  Temp(Src) 98.4 F (36.9 C) (Oral)  Resp 22  Ht 5\' 7"  (1.702 m)  Wt 195 lb (88.451 kg)  BMI 30.53 kg/m2  SpO2 99% Physical Exam  Nursing note and vitals reviewed. Constitutional: She is oriented to person, place, and time. She appears well-developed and well-nourished. No distress.  HENT:  Head: Normocephalic and atraumatic.  Cardiovascular: Normal rate, regular rhythm, normal heart sounds and intact distal pulses.  Exam reveals no gallop and no friction rub.   No murmur heard. Pulmonary/Chest: Effort normal and breath sounds normal. No respiratory distress. She has no wheezes. She has no rales. She exhibits no tenderness.  Abdominal: Soft. Bowel sounds are normal. She exhibits distension. She  exhibits no mass. There is tenderness. There is no rebound and no guarding.  Musculoskeletal: She exhibits no edema.  Lymphadenopathy:    She has no cervical adenopathy.  Neurological: She is oriented to person, place, and time.  Skin: Skin is warm and dry. She is not diaphoretic.    ED Course  Procedures (including critical care time) Labs Review Labs Reviewed  CBC - Abnormal; Notable for the following:    RBC 2.28 (*)    Hemoglobin 7.8 (*)    HCT 24.6 (*)    MCV 107.9 (*)    MCH 34.2 (*)    All other components within normal limits  COMPREHENSIVE METABOLIC PANEL - Abnormal; Notable for the following:    CO2 17 (*)    Glucose, Bld 344 (*)    BUN 63 (*)    Creatinine, Ser 2.44 (*)    Calcium 6.7 (*)    Total Protein 5.4 (*)    Albumin 2.0 (*)    AST 61 (*)    ALT 55 (*)    Alkaline Phosphatase 500 (*)    Total Bilirubin 1.5 (*)    GFR calc non Af Amer 21 (*)    GFR calc Af Amer 25 (*)    All other components within normal limits  LIPASE, BLOOD - Abnormal; Notable for the following:    Lipase 116 (*)    All other components within normal limits  URINALYSIS, ROUTINE W REFLEX MICROSCOPIC - Abnormal; Notable for the following:    Glucose, UA 100 (*)    Protein, ur >300 (*)    All other components within normal limits  SALICYLATE LEVEL - Abnormal; Notable for the following:    Salicylate Lvl <2.0 (*)    All other components within normal limits  AMMONIA - Abnormal; Notable for the following:    Ammonia 127 (*)    All other components within normal limits  CBG MONITORING, ED - Abnormal; Notable for the following:    Glucose-Capillary 335 (*)    All other components within normal limits  URINE RAPID DRUG SCREEN (HOSP PERFORMED)  ACETAMINOPHEN LEVEL  URINE MICROSCOPIC-ADD ON  AMMONIA  CBG MONITORING, ED  I-STAT CG4 LACTIC  ACID, ED    Imaging Review Ct Abdomen Pelvis Wo Contrast  01/04/2014   CLINICAL DATA:  Distended abdomen, jaundice, difficulty urinating, altered  mental status  EXAM: CT ABDOMEN AND PELVIS WITHOUT CONTRAST  TECHNIQUE: Multidetector CT imaging of the abdomen and pelvis was performed following the standard protocol without IV contrast.  COMPARISON:  Radiographs 12/17/2013  FINDINGS: Bilateral subsegmental atelectasis at the lung bases. No pleural or pericardial effusion.  Mildly nodular contour to the liver. Gallbladder is normal. Noncontrast appearance suggests the possibility of an element periportal edema. Small volume of ascites around the liver.  Spleen is normal. Pancreas is normal. Adrenal glands are normal. Kidneys are normal. Abdominal aorta shows minimal calcification with no distention.  Bladder is normal.  Reproductive organs are normal.  Stomach is decompressed which exaggerates gastric wall thickness. Allowing for this, gastric wall appears mildly thickened nonetheless. Small bowel is normal. Evaluation of the colon is difficult without IV contrast, with oral contrast not having reached the colon. The colon is not a dilated, but the does appear to be diffuse colonic wall thickening. Mild to moderate inflammatory change tracks along the entire colon.  There are no acute musculoskeletal abnormalities.  IMPRESSION: 1. Small volume of ascites. Mildly nodular liver contour. Findings suggest possibility of cirrhosis. 2. Stomach is decompressed but shows evidence of wall thickening. Possible gastritis. 3. Evidence of mild diffuse colon wall thickening with inflammatory change surrounding the entire colon. Diffuse colitis suspected.   Electronically Signed   By: Esperanza Heiraymond  Rubner M.D.   On: 01/04/2014 01:26   Ct Head Wo Contrast  01/04/2014   CLINICAL DATA:  Lethargy and altered mental status.  EXAM: CT HEAD WITHOUT CONTRAST  TECHNIQUE: Contiguous axial images were obtained from the base of the skull through the vertex without intravenous contrast.  COMPARISON:  CT of the head performed 10/23/2013  FINDINGS: There is no evidence of acute infarction, mass  lesion, or intra- or extra-axial hemorrhage on CT.  Prominence of the ventricles and sulci suggests mild cortical volume loss. Scattered periventricular white matter change likely reflects small vessel ischemic microangiopathy.  The brainstem and fourth ventricle are within normal limits. The basal ganglia are unremarkable in appearance. The cerebral hemispheres demonstrate grossly normal gray-white differentiation. No mass effect or midline shift is seen.  There is no evidence of fracture; visualized osseous structures are unremarkable in appearance. There is apparent mild bilateral proptosis. The paranasal sinuses and mastoid air cells are well-aerated. No significant soft tissue abnormalities are seen.  IMPRESSION: 1. No acute intracranial pathology seen on CT. 2. Mild cortical volume loss and scattered small vessel ischemic microangiopathy. 3. Apparent mild bilateral proptosis noted.   Electronically Signed   By: Roanna RaiderJeffery  Chang M.D.   On: 01/04/2014 01:25     EKG Interpretation None      MDM   53 year old AAF who presents today with AMS and abdominal pain. Please see history of present illness for details. On exam patient in mild amount of distress due to abdominal pain she does appear very somnolent. Appears jaundiced. Vital stable and afebrile. Abdomen is tender throughout but patient points epigastric periumbilical and right upper quadrant cystic fibrosis pain. She denies the urinary symptoms. Abdomen is round and patient endorses this is distention.  Differential includes worsening liver disease, pancreatitis, GI infection, SBO, or ischemic bowel. Will order CBC, CMP, lipase, urine studies, drug levels, ammonia, as well as CT abdomen pelvis. Given bolus NS.   CMP shows elevated liver enzymes which are her  baseline with daily fairly elevated with normal. Lipase elevated at 116, also seen in previous labs. She has a stable hemoglobin for her. She has anion gap at 15 and a bicarbonate that is 17  also her normal range. Glucose elevated at 344. However no sign of ketones in urine to suggest DKA. Drug levels negative. Lactic acid wnl.  Ammonia elevated at 127. Previous levels have also been elevated but this is the highest it's been. T. bili elevated from baseline. Liver enzymes continue to rise though not greatly from prior.  CT results show gross colitis with no other abnormalities aside from minor ascites. Patient appears to be more arousable. Complains of itching. Therefore given IV Benadryl. Patient endorses that abdominal pain has eased off.  With liver enzymes rising and acute abdominal pain with AMS patient will be admitted to the hospitalist service for further workup and management. Please see their note for further details regarding the remainder of her hospital course.  Final diagnoses:  Abdominal pain, generalized  Diarrhea  Somnolence    Pt was seen under the supervision of Dr. Deretha Emory.      Rachelle Hora, MD 01/04/14 [phone]

## 2014-01-03 NOTE — ED Provider Notes (Signed)
I saw and evaluated the patient, reviewed the resident's note and I agree with the findings and plan.   EKG Interpretation None      Patient seen by me. Patient brought in by EMS family felt that there was altered mental status. Patient is very drowsy here but to voice will wake up. Patient has extensive past medical history to include HIV CHF hepatitis C diabetes hypertensive encephalopathy. With a hospitalization in January of 2013. Workup here before the altered mental status. Patient also appears to be jaundice. Abdomen is somewhat distended. Patient with a mild increase in her bilirubin level and mild increase in her lipase but neither is significantly elevated. The patient's liver function tests otherwise are without significant change. Patient's hemoglobin is 7.8 and hematocrit to 24 episode count 4.6. This is in the range is normal for her. Ammonia level is pending. Urine drug screen was negative. Lactic acid is not significantly elevated at 1.27. CT scan of her abdomen has been ordered. Patient should probably also have CT of head. These are both pending. Disposition will be based on these results of the ammonia level the CT scans.  The patient does not improve significantly will require admission for altered mental status.  Results for orders placed during the hospital encounter of 01/03/14  CBC      Result Value Ref Range   WBC 4.6  4.0 - 10.5 K/uL   RBC 2.28 (*) 3.87 - 5.11 MIL/uL   Hemoglobin 7.8 (*) 12.0 - 15.0 g/dL   HCT 16.1 (*) 09.6 - 04.5 %   MCV 107.9 (*) 78.0 - 100.0 fL   MCH 34.2 (*) 26.0 - 34.0 pg   MCHC 31.7  30.0 - 36.0 g/dL   RDW 40.9  81.1 - 91.4 %   Platelets 171  150 - 400 K/uL  COMPREHENSIVE METABOLIC PANEL      Result Value Ref Range   Sodium 139  137 - 147 mEq/L   Potassium 3.8  3.7 - 5.3 mEq/L   Chloride 107  96 - 112 mEq/L   CO2 17 (*) 19 - 32 mEq/L   Glucose, Bld 344 (*) 70 - 99 mg/dL   BUN 63 (*) 6 - 23 mg/dL   Creatinine, Ser 7.82 (*) 0.50 - 1.10  mg/dL   Calcium 6.7 (*) 8.4 - 10.5 mg/dL   Total Protein 5.4 (*) 6.0 - 8.3 g/dL   Albumin 2.0 (*) 3.5 - 5.2 g/dL   AST 61 (*) 0 - 37 U/L   ALT 55 (*) 0 - 35 U/L   Alkaline Phosphatase 500 (*) 39 - 117 U/L   Total Bilirubin 1.5 (*) 0.3 - 1.2 mg/dL   GFR calc non Af Amer 21 (*) >90 mL/min   GFR calc Af Amer 25 (*) >90 mL/min   Anion gap 15  5 - 15  LIPASE, BLOOD      Result Value Ref Range   Lipase 116 (*) 11 - 59 U/L  URINALYSIS, ROUTINE W REFLEX MICROSCOPIC      Result Value Ref Range   Color, Urine YELLOW  YELLOW   APPearance CLEAR  CLEAR   Specific Gravity, Urine 1.010  1.005 - 1.030   pH 6.0  5.0 - 8.0   Glucose, UA 100 (*) NEGATIVE mg/dL   Hgb urine dipstick NEGATIVE  NEGATIVE   Bilirubin Urine NEGATIVE  NEGATIVE   Ketones, ur NEGATIVE  NEGATIVE mg/dL   Protein, ur >956 (*) NEGATIVE mg/dL   Urobilinogen, UA 1.0  0.0 - 1.0 mg/dL   Nitrite NEGATIVE  NEGATIVE   Leukocytes, UA NEGATIVE  NEGATIVE  URINE RAPID DRUG SCREEN (HOSP PERFORMED)      Result Value Ref Range   Opiates NONE DETECTED  NONE DETECTED   Cocaine NONE DETECTED  NONE DETECTED   Benzodiazepines NONE DETECTED  NONE DETECTED   Amphetamines NONE DETECTED  NONE DETECTED   Tetrahydrocannabinol NONE DETECTED  NONE DETECTED   Barbiturates NONE DETECTED  NONE DETECTED  ACETAMINOPHEN LEVEL      Result Value Ref Range   Acetaminophen (Tylenol), Serum <15.0  10 - 30 ug/mL  SALICYLATE LEVEL      Result Value Ref Range   Salicylate Lvl <2.0 (*) 2.8 - 20.0 mg/dL  URINE MICROSCOPIC-ADD ON      Result Value Ref Range   Squamous Epithelial / LPF RARE  RARE   WBC, UA 0-2  <3 WBC/hpf   Bacteria, UA RARE  RARE   Urine-Other FEW YEAST    CBG MONITORING, ED      Result Value Ref Range   Glucose-Capillary 335 (*) 70 - 99 mg/dL   Comment 1 Documented in Chart     Comment 2 Notify RN    I-STAT CG4 LACTIC ACID, ED      Result Value Ref Range   Lactic Acid, Venous 1.27  0.5 - 2.2 mmol/L   Dg Chest 2 View  12/14/2013    CLINICAL DATA:  Lower extremity swelling.  EXAM: CHEST  2 VIEW  COMPARISON:  November 26, 2013.  FINDINGS: The heart size and mediastinal contours are within normal limits. Right lung is clear. Stable linear density seen in left lower lobe consistent with scar. No pneumothorax or pleural effusion is noted. Interval placement of right internal jugular Port-A-Cath with distal tip in expected position of the SVC. The visualized skeletal structures are unremarkable.  IMPRESSION: No acute cardiopulmonary abnormality seen.   Electronically Signed   By: Roque Lias M.D.   On: 12/14/2013 21:32   Ct Soft Tissue Neck Wo Contrast  12/29/2013   CLINICAL DATA:  53 year old HIV positive female with palatal swelling, drooling and decreased ability to swallow.  EXAM: CT NECK WITHOUT CONTRAST  TECHNIQUE: Multidetector CT imaging of the neck was performed following the standard protocol without intravenous contrast.  COMPARISON:  Prior head CT 10/23/2013  FINDINGS: Extremely limited evaluation in the absence of intravenous contrast. The visualized intracranial contents demonstrate no acute abnormality. The visualized paranasal sinuses and mastoid air cells are normally aerated. Evaluation of the palate is limited by streak artifact related to multiple dental amalgams.  High palatal arch. There is some asymmetric soft tissue attenuation material low just left of midline at the junction of the heart and soft palate. In the absence image venous contrast material this is very nonspecific. This could represent tenacious protein ridge secretions, blood products, or a soft tissue mass. This region should be amenable to direct inspection. No invasion into the surrounding soft tissues. Otherwise, the palatal and faucial tonsils are symmetric. The uvula is unremarkable as is the epiglottis and airway. Unremarkable parotid and salivary glands. No focal soft tissue fluid collection. Right IJ approach port catheter incompletely imaged. No  suspicious adenopathy.  Unremarkable upper lungs.  IMPRESSION: 1. Nonspecific 1.5 x 0.8 cm soft tissue attenuation structure exophytic from the soft palate just left of midline. In the absence of intravenous contrast for complete evaluation this could represent proteinaceous secretions, blood products or a soft tissue mass. This region should  be amenable to direct visualization. No evidence of invasion into the adjacent soft tissues or bony structures. 2. Otherwise, unremarkable noncontrast appearance of the soft tissues of the neck. 3. Incompletely imaged right IJ approach portacatheter.   Electronically Signed   By: Malachy MoanHeath  McCullough M.D.   On: 12/29/2013 10:33   [place] Abdomen Complete  12/18/2013   CLINICAL DATA:  Acute on chronic renal failure. Right upper quadrant tenderness.  EXAM: ULTRASOUND ABDOMEN COMPLETE  COMPARISON:  07/19/2013.  FINDINGS: Gallbladder:  Gallbladder is decompressed. Upper normal wall thickness likely related to the decompressed state. There does appear to be some dependent sludge within lumen but this is difficult is assess given the lack of distention. No pericholecystic fluid. The sonographer reports no sonographic Murphy sign.  Common bile duct:  Diameter: Nondilated at 2-3 mm diameter.  Liver:  Subtle nodularity of the hepatic contour raises the question of cirrhosis. No focal intraparenchymal abnormality.  IVC:  No abnormality visualized.  Pancreas:  Pancreas is normal in the head and body. Tail not well seen secondary to overlying bowel gas.  Spleen:  Size and appearance within normal limits.  Right Kidney:  Length: 12.2 cm. No hydronephrosis. Cortical echogenicity is diffusely increased.  Left Kidney:  Length: 12.7 cm. Poor acoustic window limits assessment of the left kidney. No evidence for hydronephrosis.  Abdominal aorta:  No aneurysm visualized.  Other findings:  None.  IMPRESSION: Echogenic right kidney suggests medical renal disease. The left kidney is not well seen  secondary to poor acoustic window. There is no evidence for hydronephrosis in either kidney.  Decompressed gallbladder.   Electronically Signed   By: Kennith CenterEric  Mansell M.D.   On: 12/18/2013 15:45   Dg Abd Acute W/chest  12/17/2013   CLINICAL DATA:  The gas pattern is nonspecific. A miles ileus is not excluded.  EXAM: ACUTE ABDOMEN SERIES (ABDOMEN 2 VIEW & CHEST 1 VIEW)  COMPARISON:  PA and lateral chest x-Kopecky of December 14, 2013  FINDINGS: The lungs are adequately inflated. There is stable linear density at the left lung base consistent with atelectasis or scarring. There is no pulmonary edema. The central pulmonary vascularity is prominent but stable. The Port-A-Cath appliance is unchanged. The cardiac silhouette is normal in size.  Within the abdomen the gas pattern is nonspecific. There are a few loops of minimally distended gas-filled small bowel. The stool and gas pattern in the colon and rectum is normal. There are no extraluminal gas collections.  IMPRESSION: 1. There is no acute cardiopulmonary abnormality. 2. The gas pattern is nonspecific. A mild small bowel ileus is not excluded.   Electronically Signed   By: David  SwazilandJordan   On: 12/17/2013 07:51   Dg Hand Complete Right  12/20/2013   CLINICAL DATA:  Hand pain and swelling without known injury  EXAM: RIGHT HAND - COMPLETE 3+ VIEW  COMPARISON:  AP and lateral views of the right hand dated May 14, 2013  FINDINGS: The bones are adequately mineralized. There is minimal degenerative narrowing of the interphalangeal joints. There is mild to moderate degenerative change of the first metacarpophalangeal joint which is stable. There is no acute or healing fracture. There is no lytic or blastic or erosive lesion. The distal radius and ulna, the carpal bones, and the metacarpals exhibit no acute abnormalities. Partial fusion of the capitate and hamate is suspected.  The soft tissues exhibit no foreign bodies or abnormal gas collections.  IMPRESSION: There is no  acute bony abnormality of the right hand.  There is degenerative change as described.   Electronically Signed   By: David  [place]   On: 12/20/2013 13:45      Vanetta Mulders, MD 01/03/14 [id number]

## 2014-01-04 ENCOUNTER — Emergency Department (HOSPITAL_COMMUNITY): Payer: Medicaid Other

## 2014-01-04 ENCOUNTER — Encounter (HOSPITAL_COMMUNITY): Payer: Self-pay | Admitting: Internal Medicine

## 2014-01-04 DIAGNOSIS — B182 Chronic viral hepatitis C: Secondary | ICD-10-CM

## 2014-01-04 DIAGNOSIS — Z7982 Long term (current) use of aspirin: Secondary | ICD-10-CM | POA: Diagnosis not present

## 2014-01-04 DIAGNOSIS — A419 Sepsis, unspecified organism: Secondary | ICD-10-CM | POA: Diagnosis present

## 2014-01-04 DIAGNOSIS — N184 Chronic kidney disease, stage 4 (severe): Secondary | ICD-10-CM

## 2014-01-04 DIAGNOSIS — R188 Other ascites: Secondary | ICD-10-CM | POA: Diagnosis present

## 2014-01-04 DIAGNOSIS — F411 Generalized anxiety disorder: Secondary | ICD-10-CM | POA: Diagnosis present

## 2014-01-04 DIAGNOSIS — A4101 Sepsis due to Methicillin susceptible Staphylococcus aureus: Secondary | ICD-10-CM | POA: Diagnosis present

## 2014-01-04 DIAGNOSIS — R159 Full incontinence of feces: Secondary | ICD-10-CM | POA: Diagnosis not present

## 2014-01-04 DIAGNOSIS — Z888 Allergy status to other drugs, medicaments and biological substances status: Secondary | ICD-10-CM | POA: Diagnosis not present

## 2014-01-04 DIAGNOSIS — Z87891 Personal history of nicotine dependence: Secondary | ICD-10-CM | POA: Diagnosis not present

## 2014-01-04 DIAGNOSIS — G43909 Migraine, unspecified, not intractable, without status migrainosus: Secondary | ICD-10-CM | POA: Diagnosis present

## 2014-01-04 DIAGNOSIS — E872 Acidosis, unspecified: Secondary | ICD-10-CM | POA: Diagnosis present

## 2014-01-04 DIAGNOSIS — E7800 Pure hypercholesterolemia, unspecified: Secondary | ICD-10-CM | POA: Diagnosis present

## 2014-01-04 DIAGNOSIS — T80218A Other infection due to central venous catheter, initial encounter: Secondary | ICD-10-CM | POA: Diagnosis present

## 2014-01-04 DIAGNOSIS — E119 Type 2 diabetes mellitus without complications: Secondary | ICD-10-CM | POA: Diagnosis present

## 2014-01-04 DIAGNOSIS — Y849 Medical procedure, unspecified as the cause of abnormal reaction of the patient, or of later complication, without mention of misadventure at the time of the procedure: Secondary | ICD-10-CM | POA: Diagnosis present

## 2014-01-04 DIAGNOSIS — A4902 Methicillin resistant Staphylococcus aureus infection, unspecified site: Secondary | ICD-10-CM | POA: Diagnosis present

## 2014-01-04 DIAGNOSIS — K7682 Hepatic encephalopathy: Secondary | ICD-10-CM | POA: Diagnosis present

## 2014-01-04 DIAGNOSIS — I509 Heart failure, unspecified: Secondary | ICD-10-CM | POA: Diagnosis present

## 2014-01-04 DIAGNOSIS — N049 Nephrotic syndrome with unspecified morphologic changes: Secondary | ICD-10-CM | POA: Diagnosis present

## 2014-01-04 DIAGNOSIS — K7460 Unspecified cirrhosis of liver: Secondary | ICD-10-CM | POA: Diagnosis present

## 2014-01-04 DIAGNOSIS — J029 Acute pharyngitis, unspecified: Secondary | ICD-10-CM | POA: Diagnosis not present

## 2014-01-04 DIAGNOSIS — R569 Unspecified convulsions: Secondary | ICD-10-CM | POA: Diagnosis present

## 2014-01-04 DIAGNOSIS — I872 Venous insufficiency (chronic) (peripheral): Secondary | ICD-10-CM | POA: Diagnosis present

## 2014-01-04 DIAGNOSIS — K7290 Hepatic failure, unspecified without coma: Secondary | ICD-10-CM | POA: Diagnosis present

## 2014-01-04 DIAGNOSIS — B373 Candidiasis of vulva and vagina: Secondary | ICD-10-CM | POA: Diagnosis not present

## 2014-01-04 DIAGNOSIS — M109 Gout, unspecified: Secondary | ICD-10-CM | POA: Diagnosis present

## 2014-01-04 DIAGNOSIS — D638 Anemia in other chronic diseases classified elsewhere: Secondary | ICD-10-CM | POA: Diagnosis present

## 2014-01-04 DIAGNOSIS — R4182 Altered mental status, unspecified: Secondary | ICD-10-CM | POA: Diagnosis present

## 2014-01-04 DIAGNOSIS — B20 Human immunodeficiency virus [HIV] disease: Secondary | ICD-10-CM | POA: Diagnosis present

## 2014-01-04 DIAGNOSIS — Z8673 Personal history of transient ischemic attack (TIA), and cerebral infarction without residual deficits: Secondary | ICD-10-CM | POA: Diagnosis not present

## 2014-01-04 DIAGNOSIS — R404 Transient alteration of awareness: Secondary | ICD-10-CM

## 2014-01-04 DIAGNOSIS — K838 Other specified diseases of biliary tract: Secondary | ICD-10-CM | POA: Diagnosis not present

## 2014-01-04 DIAGNOSIS — B3731 Acute candidiasis of vulva and vagina: Secondary | ICD-10-CM | POA: Diagnosis not present

## 2014-01-04 DIAGNOSIS — N179 Acute kidney failure, unspecified: Secondary | ICD-10-CM | POA: Diagnosis present

## 2014-01-04 DIAGNOSIS — Z794 Long term (current) use of insulin: Secondary | ICD-10-CM | POA: Diagnosis not present

## 2014-01-04 DIAGNOSIS — I369 Nonrheumatic tricuspid valve disorder, unspecified: Secondary | ICD-10-CM | POA: Diagnosis not present

## 2014-01-04 DIAGNOSIS — Z8661 Personal history of infections of the central nervous system: Secondary | ICD-10-CM | POA: Diagnosis not present

## 2014-01-04 DIAGNOSIS — R1084 Generalized abdominal pain: Secondary | ICD-10-CM

## 2014-01-04 DIAGNOSIS — Z6829 Body mass index (BMI) 29.0-29.9, adult: Secondary | ICD-10-CM | POA: Diagnosis not present

## 2014-01-04 DIAGNOSIS — I129 Hypertensive chronic kidney disease with stage 1 through stage 4 chronic kidney disease, or unspecified chronic kidney disease: Secondary | ICD-10-CM | POA: Diagnosis present

## 2014-01-04 DIAGNOSIS — R197 Diarrhea, unspecified: Secondary | ICD-10-CM | POA: Diagnosis present

## 2014-01-04 DIAGNOSIS — K219 Gastro-esophageal reflux disease without esophagitis: Secondary | ICD-10-CM | POA: Diagnosis present

## 2014-01-04 DIAGNOSIS — E876 Hypokalemia: Secondary | ICD-10-CM | POA: Diagnosis present

## 2014-01-04 DIAGNOSIS — I959 Hypotension, unspecified: Secondary | ICD-10-CM | POA: Diagnosis not present

## 2014-01-04 LAB — COMPREHENSIVE METABOLIC PANEL
ALT: 54 U/L — ABNORMAL HIGH (ref 0–35)
AST: 63 U/L — AB (ref 0–37)
Albumin: 1.9 g/dL — ABNORMAL LOW (ref 3.5–5.2)
Alkaline Phosphatase: 464 U/L — ABNORMAL HIGH (ref 39–117)
Anion gap: 16 — ABNORMAL HIGH (ref 5–15)
BILIRUBIN TOTAL: 1.8 mg/dL — AB (ref 0.3–1.2)
BUN: 58 mg/dL — AB (ref 6–23)
CO2: 18 meq/L — AB (ref 19–32)
CREATININE: 2.3 mg/dL — AB (ref 0.50–1.10)
Calcium: 6.9 mg/dL — ABNORMAL LOW (ref 8.4–10.5)
Chloride: 108 mEq/L (ref 96–112)
GFR calc Af Amer: 27 mL/min — ABNORMAL LOW (ref >90)
GFR calc non Af Amer: 23 mL/min — ABNORMAL LOW (ref >90)
Glucose, Bld: 91 mg/dL (ref 70–99)
Potassium: 3 mEq/L — ABNORMAL LOW (ref 3.7–5.3)
Sodium: 142 mEq/L (ref 137–147)
Total Protein: 5.3 g/dL — ABNORMAL LOW (ref 6.0–8.3)

## 2014-01-04 LAB — CBC
HEMATOCRIT: 26.2 % — AB (ref 36.0–46.0)
Hemoglobin: 8.2 g/dL — ABNORMAL LOW (ref 12.0–15.0)
MCH: 34.3 pg — ABNORMAL HIGH (ref 26.0–34.0)
MCHC: 31.3 g/dL (ref 30.0–36.0)
MCV: 109.6 fL — AB (ref 78.0–100.0)
PLATELETS: 163 10*3/uL (ref 150–400)
RBC: 2.39 MIL/uL — AB (ref 3.87–5.11)
RDW: 14.3 % (ref 11.5–15.5)
WBC: 3.5 10*3/uL — AB (ref 4.0–10.5)

## 2014-01-04 LAB — AMMONIA
AMMONIA: 127 umol/L — AB (ref 11–60)
Ammonia: 161 umol/L — ABNORMAL HIGH (ref 11–60)

## 2014-01-04 LAB — IRON AND TIBC
Iron: 90 ug/dL (ref 42–135)
Saturation Ratios: 50 % (ref 20–55)
TIBC: 180 ug/dL — ABNORMAL LOW (ref 250–470)
UIBC: 90 ug/dL — AB (ref 125–400)

## 2014-01-04 LAB — PROTIME-INR
INR: 0.92 (ref 0.00–1.49)
PROTHROMBIN TIME: 12.4 s (ref 11.6–15.2)

## 2014-01-04 LAB — GLUCOSE, CAPILLARY
Glucose-Capillary: 134 mg/dL — ABNORMAL HIGH (ref 70–99)
Glucose-Capillary: 102 mg/dL — ABNORMAL HIGH (ref 70–99)
Glucose-Capillary: 136 mg/dL — ABNORMAL HIGH (ref 70–99)
Glucose-Capillary: 192 mg/dL — ABNORMAL HIGH (ref 70–99)
Glucose-Capillary: 330 mg/dL — ABNORMAL HIGH (ref 70–99)
Glucose-Capillary: 178 mg/dL — ABNORMAL HIGH (ref 70–99)

## 2014-01-04 LAB — MRSA PCR SCREENING: MRSA by PCR: POSITIVE — AB

## 2014-01-04 LAB — FOLATE RBC: RBC FOLATE: 1093 ng/mL — AB (ref >280)

## 2014-01-04 LAB — VITAMIN B12: Vitamin B-12: 564 pg/mL (ref 211–911)

## 2014-01-04 LAB — APTT: aPTT: 26 seconds (ref 24–37)

## 2014-01-04 LAB — FERRITIN: Ferritin: 611 ng/mL — ABNORMAL HIGH (ref 10–291)

## 2014-01-04 LAB — PHENYTOIN LEVEL, TOTAL: Phenytoin Lvl: 16.4 ug/mL (ref 10.0–20.0)

## 2014-01-04 LAB — LACTATE DEHYDROGENASE: LDH: 391 U/L — ABNORMAL HIGH (ref 94–250)

## 2014-01-04 MED ORDER — INSULIN ASPART 100 UNIT/ML ~~LOC~~ SOLN: 0.0000 [IU] | Freq: Every day | SUBCUTANEOUS | Status: DC

## 2014-01-04 MED ORDER — POTASSIUM CHLORIDE CRYS ER 10 MEQ PO TBCR
10.0000 meq | EXTENDED_RELEASE_TABLET | Freq: Every day | ORAL | Status: DC
  Filled 2014-01-04: qty 1

## 2014-01-04 MED ORDER — ENOXAPARIN SODIUM 30 MG/0.3ML ~~LOC~~ SOLN
30.0000 mg | SUBCUTANEOUS | Status: DC
  Administered 2014-01-04 – 2014-01-06 (×3): 30 mg via SUBCUTANEOUS
  Filled 2014-01-04 (×3): qty 0.3

## 2014-01-04 MED ORDER — RITONAVIR 100 MG PO CAPS
100.0000 mg | ORAL_CAPSULE | Freq: Every day | ORAL | Status: DC
  Administered 2014-01-04 – 2014-01-15 (×11): 100 mg via ORAL
  Filled 2014-01-04 (×14): qty 1

## 2014-01-04 MED ORDER — PANTOPRAZOLE SODIUM 40 MG PO TBEC
40.0000 mg | DELAYED_RELEASE_TABLET | Freq: Every day | ORAL | Status: DC
  Administered 2014-01-04 – 2014-01-11 (×8): 40 mg via ORAL
  Filled 2014-01-04 (×7): qty 1

## 2014-01-04 MED ORDER — DIPHENHYDRAMINE HCL 50 MG/ML IJ SOLN
INTRAMUSCULAR | Status: AC
  Filled 2014-01-04: qty 1

## 2014-01-04 MED ORDER — ACYCLOVIR 800 MG PO TABS
800.0000 mg | ORAL_TABLET | Freq: Every day | ORAL | Status: DC
  Administered 2014-01-04 – 2014-01-15 (×12): 800 mg via ORAL
  Filled 2014-01-04 (×12): qty 1

## 2014-01-04 MED ORDER — INSULIN GLARGINE 100 UNIT/ML ~~LOC~~ SOLN
20.0000 [IU] | Freq: Every day | SUBCUTANEOUS | Status: DC
  Administered 2014-01-04 – 2014-01-14 (×11): 20 [IU] via SUBCUTANEOUS
  Filled 2014-01-04 (×13): qty 0.2

## 2014-01-04 MED ORDER — SODIUM BICARBONATE 650 MG PO TABS
650.0000 mg | ORAL_TABLET | Freq: Two times a day (BID) | ORAL | Status: DC
  Administered 2014-01-04 – 2014-01-15 (×23): 650 mg via ORAL
  Filled 2014-01-04 (×24): qty 1

## 2014-01-04 MED ORDER — LACTULOSE ENEMA
300.0000 mL | ORAL | Status: AC
  Administered 2014-01-04: 300 mL via RECTAL
  Filled 2014-01-04: qty 300

## 2014-01-04 MED ORDER — DARUNAVIR ETHANOLATE 800 MG PO TABS
800.0000 mg | ORAL_TABLET | Freq: Every day | ORAL | Status: DC
  Administered 2014-01-04 – 2014-01-15 (×11): 800 mg via ORAL
  Filled 2014-01-04 (×13): qty 1

## 2014-01-04 MED ORDER — ADULT MULTIVITAMIN W/MINERALS CH
1.0000 | ORAL_TABLET | Freq: Every day | ORAL | Status: DC
  Administered 2014-01-04 – 2014-01-05 (×2): 1 via ORAL
  Filled 2014-01-04 (×2): qty 1

## 2014-01-04 MED ORDER — LISINOPRIL 40 MG PO TABS
40.0000 mg | ORAL_TABLET | Freq: Every day | ORAL | Status: DC
  Administered 2014-01-04 – 2014-01-06 (×3): 40 mg via ORAL
  Filled 2014-01-04 (×3): qty 1

## 2014-01-04 MED ORDER — INSULIN ASPART 100 UNIT/ML ~~LOC~~ SOLN: 0.0000 [IU] | Freq: Three times a day (TID) | SUBCUTANEOUS | Status: DC

## 2014-01-04 MED ORDER — LEVETIRACETAM 500 MG PO TABS
1000.0000 mg | ORAL_TABLET | Freq: Two times a day (BID) | ORAL | Status: DC
  Administered 2014-01-04 – 2014-01-06 (×5): 1000 mg via ORAL
  Filled 2014-01-04 (×6): qty 2

## 2014-01-04 MED ORDER — ASPIRIN EC 81 MG PO TBEC
81.0000 mg | DELAYED_RELEASE_TABLET | Freq: Every day | ORAL | Status: DC
  Administered 2014-01-04 – 2014-01-15 (×12): 81 mg via ORAL
  Filled 2014-01-04 (×12): qty 1

## 2014-01-04 MED ORDER — MUPIROCIN 2 % EX OINT
1.0000 "application " | TOPICAL_OINTMENT | Freq: Two times a day (BID) | CUTANEOUS | Status: AC
  Administered 2014-01-04 – 2014-01-09 (×10): 1 via NASAL
  Filled 2014-01-04 (×2): qty 22

## 2014-01-04 MED ORDER — PHENYTOIN SODIUM EXTENDED 100 MG PO CAPS: 300.0000 mg | ORAL_CAPSULE | Freq: Two times a day (BID) | ORAL | Status: DC

## 2014-01-04 MED ORDER — SODIUM CHLORIDE 0.9 % IJ SOLN
3.0000 mL | Freq: Two times a day (BID) | INTRAMUSCULAR | Status: DC
  Administered 2014-01-04 – 2014-01-14 (×16): 3 mL via INTRAVENOUS

## 2014-01-04 MED ORDER — INSULIN ASPART 100 UNIT/ML ~~LOC~~ SOLN
0.0000 [IU] | SUBCUTANEOUS | Status: DC
Start: 2014-01-04 — End: 2014-01-08
  Administered 2014-01-04 (×2): 2 [IU] via SUBCUTANEOUS
  Administered 2014-01-04: 1 [IU] via SUBCUTANEOUS
  Administered 2014-01-04: 7 [IU] via SUBCUTANEOUS
  Administered 2014-01-05: 5 [IU] via SUBCUTANEOUS
  Administered 2014-01-05: 1 [IU] via SUBCUTANEOUS
  Administered 2014-01-05: 3 [IU] via SUBCUTANEOUS
  Administered 2014-01-05: 2 [IU] via SUBCUTANEOUS
  Administered 2014-01-06: 3 [IU] via SUBCUTANEOUS
  Administered 2014-01-06: 2 [IU] via SUBCUTANEOUS
  Administered 2014-01-06: 7 [IU] via SUBCUTANEOUS
  Administered 2014-01-06: 3 [IU] via SUBCUTANEOUS
  Administered 2014-01-06 – 2014-01-07 (×2): 5 [IU] via SUBCUTANEOUS
  Administered 2014-01-07: 1 [IU] via SUBCUTANEOUS
  Administered 2014-01-07: 3 [IU] via SUBCUTANEOUS
  Administered 2014-01-08: 2 [IU] via SUBCUTANEOUS
  Administered 2014-01-08: 1 [IU] via SUBCUTANEOUS
  Administered 2014-01-08: 2 [IU] via SUBCUTANEOUS
  Administered 2014-01-08: 1 [IU] via SUBCUTANEOUS

## 2014-01-04 MED ORDER — ABACAVIR SULFATE 300 MG PO TABS
300.0000 mg | ORAL_TABLET | Freq: Two times a day (BID) | ORAL | Status: DC
Start: 2014-01-04 — End: 2014-01-15
  Administered 2014-01-04 – 2014-01-15 (×23): 300 mg via ORAL
  Filled 2014-01-04 (×24): qty 1

## 2014-01-04 MED ORDER — LACTULOSE 10 GM/15ML PO SOLN: 20.0000 g | Freq: Once | ORAL | Status: DC

## 2014-01-04 MED ORDER — LAMIVUDINE 150 MG PO TABS
150.0000 mg | ORAL_TABLET | Freq: Every day | ORAL | Status: DC
  Administered 2014-01-04 – 2014-01-06 (×3): 150 mg via ORAL
  Filled 2014-01-04 (×3): qty 1

## 2014-01-04 MED ORDER — INSULIN GLARGINE 100 UNIT/ML ~~LOC~~ SOLN
30.0000 [IU] | Freq: Every day | SUBCUTANEOUS | Status: DC
  Filled 2014-01-04: qty 0.3

## 2014-01-04 MED ORDER — CHLORHEXIDINE GLUCONATE CLOTH 2 % EX PADS
6.0000 | MEDICATED_PAD | Freq: Every day | CUTANEOUS | Status: AC
  Administered 2014-01-05 – 2014-01-09 (×4): 6 via TOPICAL

## 2014-01-04 MED ORDER — RISAQUAD PO CAPS
2.0000 | ORAL_CAPSULE | Freq: Every day | ORAL | Status: DC
  Administered 2014-01-04 – 2014-01-05 (×2): 2 via ORAL
  Filled 2014-01-04 (×2): qty 2

## 2014-01-04 MED ORDER — HYDROXYZINE HCL 10 MG PO TABS
10.0000 mg | ORAL_TABLET | Freq: Three times a day (TID) | ORAL | Status: DC | PRN
  Administered 2014-01-04: 10 mg via ORAL
  Filled 2014-01-04 (×2): qty 1

## 2014-01-04 MED ORDER — FUROSEMIDE 10 MG/ML IJ SOLN
40.0000 mg | Freq: Two times a day (BID) | INTRAMUSCULAR | Status: DC
  Administered 2014-01-05 (×2): 40 mg via INTRAVENOUS
  Filled 2014-01-04 (×5): qty 4

## 2014-01-04 MED ORDER — HYDRALAZINE HCL 20 MG/ML IJ SOLN
20.0000 mg | Freq: Once | INTRAMUSCULAR | Status: AC
  Administered 2014-01-04: 20 mg via INTRAVENOUS
  Filled 2014-01-04: qty 1

## 2014-01-04 MED ORDER — ONDANSETRON HCL 4 MG/2ML IJ SOLN
4.0000 mg | Freq: Four times a day (QID) | INTRAMUSCULAR | Status: DC | PRN
Start: 2014-01-04 — End: 2014-01-15
  Filled 2014-01-04: qty 2

## 2014-01-04 MED ORDER — POTASSIUM CHLORIDE 10 MEQ/100ML IV SOLN
10.0000 meq | INTRAVENOUS | Status: AC
  Administered 2014-01-04 (×3): 10 meq via INTRAVENOUS
  Filled 2014-01-04 (×3): qty 100

## 2014-01-04 MED ORDER — DIPHENHYDRAMINE HCL 50 MG/ML IJ SOLN
12.5000 mg | Freq: Once | INTRAMUSCULAR | Status: AC
  Administered 2014-01-04: 12.5 mg via INTRAVENOUS

## 2014-01-04 MED ORDER — LABETALOL HCL 200 MG PO TABS
400.0000 mg | ORAL_TABLET | Freq: Two times a day (BID) | ORAL | Status: DC
  Administered 2014-01-04 – 2014-01-06 (×4): 400 mg via ORAL
  Filled 2014-01-04 (×6): qty 2

## 2014-01-04 MED ORDER — CLONIDINE HCL 0.3 MG PO TABS
0.3000 mg | ORAL_TABLET | Freq: Three times a day (TID) | ORAL | Status: DC
  Administered 2014-01-04 – 2014-01-06 (×7): 0.3 mg via ORAL
  Filled 2014-01-04 (×10): qty 1

## 2014-01-04 MED ORDER — LACTULOSE 10 GM/15ML PO SOLN
20.0000 g | Freq: Three times a day (TID) | ORAL | Status: DC
  Administered 2014-01-04 – 2014-01-15 (×34): 20 g via ORAL
  Filled 2014-01-04 (×36): qty 30

## 2014-01-04 MED ORDER — HYDRALAZINE HCL 50 MG PO TABS
50.0000 mg | ORAL_TABLET | Freq: Three times a day (TID) | ORAL | Status: DC
  Administered 2014-01-04 – 2014-01-06 (×7): 50 mg via ORAL
  Filled 2014-01-04 (×11): qty 1

## 2014-01-04 MED ORDER — ONDANSETRON HCL 4 MG PO TABS: 4.0000 mg | ORAL_TABLET | Freq: Four times a day (QID) | ORAL | Status: DC | PRN

## 2014-01-04 NOTE — H&P (Signed)
Michelle [name] is an 53 y.o. female.   Chief Complaint: Lethargy. HPI: Pt is a 53 yr old woman who was brought to the ED by her family because she has been increasingly lethargic for the past 24 hours. They are not available at the time that I am seeing the patient. For me the patient is arouseable, but somnolent. She wakes up and attempts to answer questions, but then goes back to sleep.  Past Medical History  Diagnosis Date  . Stroke   . Meningitis   . HIV (human immunodeficiency virus infection) 1981  . Hypertension   . Gout   . Muscle spasms of head and/or neck   . CHF (congestive heart failure)     [name] 06/18/2013  . HCV (hepatitis C virus)     chronic/notes 06/18/2013  . Type II diabetes mellitus     [name] 06/18/2013  . AIHA (autoimmune hemolytic anemia)     [name] 06/18/2013  . Hypertensive encephalopathy ~ 05/2013    hospitalaized/notes 06/18/2013  . Exertional shortness of breath     [name] 06/18/2013  . Anxiety     [name] 06/18/2013  . History of syphilis   . High cholesterol   . CKD (chronic kidney disease), stage III   . Nephrotic syndrome   . Cirrhosis     hepatitis C   . GERD (gastroesophageal reflux disease)   . Migraine     "all the time" (11/27/2013)  . Seizures     "last sz was week before last" (11/27/2013)  . Psychosis   . Chronic venous stasis dermatitis of both lower extremities     Past Surgical History  Procedure Laterality Date  . Hip pinning Right 1980's  . Av fistula placement Right 07/24/2013    Procedure: RIGHT arm exploration of antecubital space;  Surgeon: Elam Dutch, MD;  Location: [facility];  Service: Vascular;  Laterality: Right;  . Portacath placement  11/27/2013  . Colonoscopy N/A 12/19/2013    Procedure: COLONOSCOPY;  Surgeon: Gatha Mayer, MD;  Location: [facility];  Service: Endoscopy;  Laterality: N/A;  . Esophagogastroduodenoscopy N/A 12/19/2013    Procedure: ESOPHAGOGASTRODUODENOSCOPY (EGD);  Surgeon: Gatha Mayer, MD;  Location: [facility]  ENDOSCOPY;  Service: Endoscopy;  Laterality: N/A;    Family History  Problem Relation Age of Onset  . Cancer - Colon Mother   . Cancer Father   . Hypertension Father   . Diabetes    . Diabetes Sister    Social History:  reports that she quit smoking about 3 years ago. Her smoking use included Cigarettes. She smoked 0.00 packs per day. She has never used smokeless tobacco. She reports that she uses illicit drugs (Cocaine, Marijuana, and "Crack" cocaine). She reports that she does not drink alcohol.  Allergies:  Allergies  Allergen Reactions  . 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 Morphine And Related Hives, Itching and Rash  . Norvasc [Amlodipine Besylate] Hives, Itching and Rash     (Not in a hospital admission)  Results for orders placed during the hospital encounter of 01/03/14 (from the past 48 hour(s))  CBG MONITORING, ED     Status: Abnormal   Collection Time    01/03/14  8:54 PM      Result Value Ref Range   Glucose-Capillary 335 (*) 70 - 99 mg/dL   Comment 1 Documented in Chart     Comment 2 Notify RN  CBC     Status: Abnormal   Collection Time    01/03/14  9:15 PM      Result Value Ref Range   WBC 4.6  4.0 - 10.5 K/uL   RBC 2.28 (*) 3.87 - 5.11 MIL/uL   Hemoglobin 7.8 (*) 12.0 - 15.0 g/dL   HCT 24.6 (*) 36.0 - 46.0 %   MCV 107.9 (*) 78.0 - 100.0 fL   MCH 34.2 (*) 26.0 - 34.0 pg   MCHC 31.7  30.0 - 36.0 g/dL   RDW 14.3  11.5 - 15.5 %   Platelets 171  150 - 400 K/uL  COMPREHENSIVE METABOLIC PANEL     Status: Abnormal   Collection Time    01/03/14  9:15 PM      Result Value Ref Range   Sodium 139  137 - 147 mEq/L   Potassium 3.8  3.7 - 5.3 mEq/L   Chloride 107  96 - 112 mEq/L   CO2 17 (*) 19 - 32 mEq/L   Glucose, Bld 344 (*) 70 - 99 mg/dL   BUN 63 (*) 6 - 23 mg/dL   Creatinine, Ser 2.44 (*) 0.50 - 1.10 mg/dL   Calcium 6.7 (*) 8.4 - 10.5 mg/dL   Total Protein 5.4 (*) 6.0 - 8.3 g/dL    Albumin 2.0 (*) 3.5 - 5.2 g/dL   AST 61 (*) 0 - 37 U/L   ALT 55 (*) 0 - 35 U/L   Alkaline Phosphatase 500 (*) 39 - 117 U/L   Total Bilirubin 1.5 (*) 0.3 - 1.2 mg/dL   GFR calc non Af Amer 21 (*) >90 mL/min   GFR calc Af Amer 25 (*) >90 mL/min   Comment: (NOTE)     The eGFR has been calculated using the CKD EPI equation.     This calculation has not been validated in all clinical situations.     eGFR's persistently <90 mL/min signify possible Chronic Kidney     Disease.   Anion gap 15  5 - 15  LIPASE, BLOOD     Status: Abnormal   Collection Time    01/03/14  9:15 PM      Result Value Ref Range   Lipase 116 (*) 11 - 59 U/L  ACETAMINOPHEN LEVEL     Status: None   Collection Time    01/03/14  9:15 PM      Result Value Ref Range   Acetaminophen (Tylenol), Serum <15.0  10 - 30 ug/mL   Comment:            THERAPEUTIC CONCENTRATIONS VARY     SIGNIFICANTLY. A RANGE OF 10-30     ug/mL MAY BE AN EFFECTIVE     CONCENTRATION FOR MANY PATIENTS.     HOWEVER, SOME ARE BEST TREATED     AT CONCENTRATIONS OUTSIDE THIS     RANGE.     ACETAMINOPHEN CONCENTRATIONS     >150 ug/mL AT 4 HOURS AFTER     INGESTION AND >50 ug/mL AT 12     HOURS AFTER INGESTION ARE     OFTEN ASSOCIATED WITH TOXIC     REACTIONS.  SALICYLATE LEVEL     Status: Abnormal   Collection Time    01/03/14  9:15 PM      Result Value Ref Range   Salicylate Lvl <3.2 (*) 2.8 - 20.0 mg/dL  I-STAT CG4 LACTIC ACID, ED     Status: None   Collection Time    01/03/14 10:42 PM  Result Value Ref Range   Lactic Acid, Venous 1.27  0.5 - 2.2 mmol/L  URINALYSIS, ROUTINE W REFLEX MICROSCOPIC     Status: Abnormal   Collection Time    01/03/14 11:11 PM      Result Value Ref Range   Color, Urine YELLOW  YELLOW   APPearance CLEAR  CLEAR   Specific Gravity, Urine 1.010  1.005 - 1.030   pH 6.0  5.0 - 8.0   Glucose, UA 100 (*) NEGATIVE mg/dL   Hgb urine dipstick NEGATIVE  NEGATIVE   Bilirubin Urine NEGATIVE  NEGATIVE   Ketones, ur  NEGATIVE  NEGATIVE mg/dL   Protein, ur >300 (*) NEGATIVE mg/dL   Urobilinogen, UA 1.0  0.0 - 1.0 mg/dL   Nitrite NEGATIVE  NEGATIVE   Leukocytes, UA NEGATIVE  NEGATIVE  URINE RAPID DRUG SCREEN (HOSP PERFORMED)     Status: None   Collection Time    01/03/14 11:11 PM      Result Value Ref Range   Opiates NONE DETECTED  NONE DETECTED   Cocaine NONE DETECTED  NONE DETECTED   Benzodiazepines NONE DETECTED  NONE DETECTED   Amphetamines NONE DETECTED  NONE DETECTED   Tetrahydrocannabinol NONE DETECTED  NONE DETECTED   Barbiturates NONE DETECTED  NONE DETECTED   Comment:            DRUG SCREEN FOR MEDICAL PURPOSES     ONLY.  IF CONFIRMATION IS NEEDED     FOR ANY PURPOSE, NOTIFY LAB     WITHIN 5 DAYS.                LOWEST DETECTABLE LIMITS     FOR URINE DRUG SCREEN     Drug Class       Cutoff (ng/mL)     Amphetamine      1000     Barbiturate      200     Benzodiazepine   297     Tricyclics       989     Opiates          300     Cocaine          300     THC              50  URINE MICROSCOPIC-ADD ON     Status: None   Collection Time    01/03/14 11:11 PM      Result Value Ref Range   Squamous Epithelial / LPF RARE  RARE   WBC, UA 0-2  <3 WBC/hpf   Bacteria, UA RARE  RARE   Urine-Other FEW YEAST    AMMONIA     Status: Abnormal   Collection Time    01/04/14  1:00 AM      Result Value Ref Range   Ammonia 127 (*) 11 - 60 umol/L   Ct Abdomen Pelvis Wo Contrast  01/04/2014   CLINICAL DATA:  Distended abdomen, jaundice, difficulty urinating, altered mental status  EXAM: CT ABDOMEN AND PELVIS WITHOUT CONTRAST  TECHNIQUE: Multidetector CT imaging of the abdomen and pelvis was performed following the standard protocol without IV contrast.  COMPARISON:  Radiographs 12/17/2013  FINDINGS: Bilateral subsegmental atelectasis at the lung bases. No pleural or pericardial effusion.  Mildly nodular contour to the liver. Gallbladder is normal. Noncontrast appearance suggests the possibility of an  element periportal edema. Small volume of ascites around the liver.  Spleen is normal. Pancreas is normal. Adrenal glands are normal.  Kidneys are normal. Abdominal aorta shows minimal calcification with no distention.  Bladder is normal.  Reproductive organs are normal.  Stomach is decompressed which exaggerates gastric wall thickness. Allowing for this, gastric wall appears mildly thickened nonetheless. Small bowel is normal. Evaluation of the colon is difficult without IV contrast, with oral contrast not having reached the colon. The colon is not a dilated, but the does appear to be diffuse colonic wall thickening. Mild to moderate inflammatory change tracks along the entire colon.  There are no acute musculoskeletal abnormalities.  IMPRESSION: 1. Small volume of ascites. Mildly nodular liver contour. Findings suggest possibility of cirrhosis. 2. Stomach is decompressed but shows evidence of wall thickening. Possible gastritis. 3. Evidence of mild diffuse colon wall thickening with inflammatory change surrounding the entire colon. Diffuse colitis suspected.   Electronically Signed   By: Skipper Cliche M.D.   On: 01/04/2014 01:26   Ct Head Wo Contrast  01/04/2014   CLINICAL DATA:  Lethargy and altered mental status.  EXAM: CT HEAD WITHOUT CONTRAST  TECHNIQUE: Contiguous axial images were obtained from the base of the skull through the vertex without intravenous contrast.  COMPARISON:  CT of the head performed 10/23/2013  FINDINGS: There is no evidence of acute infarction, mass lesion, or intra- or extra-axial hemorrhage on CT.  Prominence of the ventricles and sulci suggests mild cortical volume loss. Scattered periventricular white matter change likely reflects small vessel ischemic microangiopathy.  The brainstem and fourth ventricle are within normal limits. The basal ganglia are unremarkable in appearance. The cerebral hemispheres demonstrate grossly normal gray-white differentiation. No mass effect or  midline shift is seen.  There is no evidence of fracture; visualized osseous structures are unremarkable in appearance. There is apparent mild bilateral proptosis. The paranasal sinuses and mastoid air cells are well-aerated. No significant soft tissue abnormalities are seen.  IMPRESSION: 1. No acute intracranial pathology seen on CT. 2. Mild cortical volume loss and scattered small vessel ischemic microangiopathy. 3. Apparent mild bilateral proptosis noted.   Electronically Signed   By: Garald Balding M.D.   On: 01/04/2014 01:25    Review of Systems  Unable to perform ROS: medical condition    Blood pressure 165/79, pulse 72, temperature 98.4 F (36.9 C), temperature source Oral, resp. rate 22, height 5' 7" (1.702 m), weight 88.451 kg (195 lb), SpO2 100.00%. Physical Exam  Constitutional: No distress.  Pt is morbidly obese and too somnolent to participate with history or physical.  HENT:  Head: Normocephalic and atraumatic.  Pt is unable to cooperate with exam.  Eyes: Conjunctivae and EOM are normal. Pupils are equal, round, and reactive to light. Scleral icterus is present.  Neck: Normal range of motion. Neck supple. No JVD present. No tracheal deviation present. No thyromegaly present.  Cardiovascular: Normal rate, regular rhythm, normal heart sounds and intact distal pulses.  Exam reveals no gallop and no friction rub.   No murmur heard. Respiratory: No stridor. No respiratory distress. She has no wheezes. She has no rales. She exhibits no tenderness.  No increased work of breathing. No tactile fremitus.  GI: She exhibits distension. She exhibits no mass. There is no tenderness. There is no rebound and no guarding.  Abdomen is tensely distended. I am unable to evaluate fo organomegaly or masses. Bowel sounds are distant. The abdomen is non-tender. Positive fluid wave.r   Musculoskeletal: She exhibits edema. She exhibits no tenderness.  1-2+ pitting edema of lower extremities bilaterally.   Lymphadenopathy:    She  has no cervical adenopathy.  Neurological:  Pt is somnolent and unable to cooperate with exam.  However, when she is stimulated she does wake and stir and appears to move all extremities at that time.  Skin: She is not diaphoretic.  Psychiatric:  Pt is unable to cooperate with exam.     Assessment/Plan 1. Hepatic encephalopathy - NH3 level greater than 100. Pt will be given lactulose and her ammonium level will be followed. She will be kept NPO until she is more awake due to risk of aspiration. 2. Ascites - tense. Pt would likely benefit from a paracentesis. She should have an infusion of albumin prior to procedure to prevent large volume fluid shifts. 3. 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 Hepatitis C - Continue home medications. 5. Chronic anemia - Although Hgb is quite low at 7.8 it is unchanged from prior visit 2 weeks ago and only slightly lower than it was 6 weeks ago (8.1). I will not transfuse the patient at this level, but her hemoglobin and hematocrit will be carefully monitored. I will order LDH and haptoglobin to investigate possibility of hemolytic anemia and Fe studies as well. 6. Chronic Kidney disease - Pt would benefit from a nephrology consult. 7. Metabolic acidosis with increased anion gap (Chronic) - Continue sodium bicarbonate  I have spent 64 minutes on the admission and care of this patient.  ,  01/04/2014, 3:07 AM

## 2014-01-04 NOTE — H&P (Signed)
  I have seen and examined the patient myself, and I have reviewed the note by Lillia Carmel, MS 4 and was present during the interview and physical exam.  Please see my separate H&P for additional findings, assessment, and plan.   Signed: Ky Barban, MD 01/04/2014, 8:51 PM

## 2014-01-04 NOTE — Progress Notes (Signed)
INITIAL NUTRITION ASSESSMENT  DOCUMENTATION CODES Per approved criteria  -Not Applicable   INTERVENTION: -No supplements at this time.  -Encourage adequate PO intake.  NUTRITION DIAGNOSIS: Inadequate oral intake related to inability to eat as evidenced by NPO status  Goal: Patient will meet >/=90% of estimated nutrition needs  Monitor:  PO intake, weight, labs, I/Os  Reason for Assessment: Malnutrition screening tool   53 y.o. female  Admitting Dx: Hepatic encephalopathy  ASSESSMENT: 53 year old female with history of CKD stage 4, DM, HIV, hepatitis C, brought to the ED with increasing lethargy for the last 24 hours.   Unable to obtain history from patient due to mental status. Patient was NPO, diet just advanced to Heart Healthy/Carbohydrate modified. Patient with good intake during previous admission recently. She declined supplements at that time. Question accuracy of current weight as admission weight was 195 pounds.   Height: Ht Readings from Last 1 Encounters:  01/04/14 5\' 5"  (1.651 m)    Weight: Wt Readings from Last 1 Encounters:  01/04/14 172 lb 6.4 oz (78.2 kg)    Wt Readings from Last 10 Encounters:  01/04/14 172 lb 6.4 oz (78.2 kg)  12/24/13 195 lb (88.451 kg)  12/20/13 194 lb 12.8 oz (88.361 kg)  12/20/13 194 lb 12.8 oz (88.361 kg)  12/16/13 195 lb 15.8 oz (88.9 kg)  12/13/13 211 lb 3.2 oz (95.8 kg)  12/03/13 182 lb (82.555 kg)  11/13/13 195 lb (88.451 kg)  11/08/13 195 lb 9.6 oz (88.724 kg)  11/01/13 205 lb 11.2 oz (93.305 kg)    Usual Body Weight: 195 pounds  % Usual Body Weight: 100%  BMI:  Body mass index is 28.69 kg/(m^2). Patient is overweight.   Estimated Nutritional Needs: Kcal: 1850-2000 kcal Protein: 80-95 g Fluid: >2.0 L/day  Skin: intact  Diet Order:    EDUCATION NEEDS: -No education needs identified at this time   Intake/Output Summary (Last 24 hours) at 01/04/14 1631 Last data filed at 01/04/14 0432  Gross per 24  hour  Intake      0 ml  Output      0 ml  Net      0 ml    Last BM: PTA   Labs:   Recent Labs Lab 12/29/13 0946 01/03/14 2115 01/04/14 0730  NA 140 139 142  K 3.6* 3.8 3.0*  CL 110 107 108  CO2 14* 17* 18*  BUN 46* 63* 58*  CREATININE 2.79* 2.44* 2.30*  CALCIUM 7.3* 6.7* 6.9*  GLUCOSE 172* 344* 91    CBG (last 3)   Recent Labs  01/04/14 0743 01/04/14 1202 01/04/14 1622  GLUCAP 102* 136* 192*    Scheduled Meds: . abacavir  300 mg Oral BID  . acidophilus  2 capsule Oral Daily  . acyclovir  800 mg Oral Daily  . aspirin EC  81 mg Oral Daily  . cloNIDine  0.3 mg Oral TID  . Darunavir Ethanolate  800 mg Oral Q breakfast  . enoxaparin (LOVENOX) injection  30 mg Subcutaneous Q24H  . hydrALAZINE  50 mg Oral TID  . insulin aspart  0-9 Units Subcutaneous 6 times per day  . insulin glargine  20 Units Subcutaneous QHS  . labetalol  400 mg Oral BID  . lactulose  20 g Oral TID  . lamiVUDine  150 mg Oral Daily  . levETIRAcetam  1,000 mg Oral BID  . lisinopril  40 mg Oral Daily  . multivitamin with minerals  1 tablet Oral Daily  .  pantoprazole  40 mg Oral Daily  . potassium chloride  10 mEq Intravenous Q1 Hr x 3  . ritonavir  100 mg Oral Q breakfast  . sodium bicarbonate  650 mg Oral BID  . sodium chloride  3 mL Intravenous Q12H    Continuous Infusions:   Past Medical History  Diagnosis Date  . Stroke   . Meningitis   . HIV (human immunodeficiency virus infection) 1981  . Hypertension   . Gout   . Muscle spasms of head and/or neck   . CHF (congestive heart failure)     [name] 06/18/2013  . HCV (hepatitis C virus)     chronic/notes 06/18/2013  . Type II diabetes mellitus     [name] 06/18/2013  . AIHA (autoimmune hemolytic anemia)     [name] 06/18/2013  . Hypertensive encephalopathy ~ 05/2013    hospitalaized/notes 06/18/2013  . Exertional shortness of breath     [name] 06/18/2013  . Anxiety     [name] 06/18/2013  . History of syphilis   . High cholesterol    . CKD (chronic kidney disease), stage III   . Nephrotic syndrome   . Cirrhosis     hepatitis C   . GERD (gastroesophageal reflux disease)   . Migraine     "all the time" (11/27/2013)  . Seizures     "last sz was week before last" (11/27/2013)  . Psychosis   . Chronic venous stasis dermatitis of both lower extremities     Past Surgical History  Procedure Laterality Date  . Hip pinning Right 1980's  . Av fistula placement Right 07/24/2013    Procedure: RIGHT arm exploration of antecubital space;  Surgeon: Sherren Kerns, MD;  Location: [facility] OR;  Service: Vascular;  Laterality: Right;  . Portacath placement  11/27/2013  . Colonoscopy N/A 12/19/2013    Procedure: COLONOSCOPY;  Surgeon: Iva Boop, MD;  Location: [facility] ENDOSCOPY;  Service: Endoscopy;  Laterality: N/A;  . Esophagogastroduodenoscopy N/A 12/19/2013    Procedure: ESOPHAGOGASTRODUODENOSCOPY (EGD);  Surgeon: Iva Boop, MD;  Location: [facility] ENDOSCOPY;  Service: Endoscopy;  Laterality: N/A;    Linnell Fulling, RD, LDN Pager #: [phone] After-Hours Pager #: [phone]

## 2014-01-04 NOTE — Progress Notes (Addendum)
Received report from Ed RN at [id number]. Room ready and awaiting arrival of pt to 4N08. [place], Charly Hunton N, RN

## 2014-01-04 NOTE — Progress Notes (Signed)
Patient BP 202/89, HR 78. Notified Swayze, DO who gave verbal order for one time dose 20 mg hydralazine IV.

## 2014-01-04 NOTE — Progress Notes (Signed)
Pt arrived to unit via stretcher [phone]. No family at bedside. Pt is alert and oriented, except to situation.  Pt states she does not understand why she is at the hospital.In no apparent distress. IV intact. Skin present with multiple scabs to upper and lower extremities bilaterally. Bed alarm in place. Pt understands how to get in contact with nursing staff. Placed on tele box #10, NSR. Will continue to monitor patient per MD orders

## 2014-01-04 NOTE — Progress Notes (Signed)
MEDICATION RELATED CONSULT NOTE - INITIAL   Pharmacy Consult for Dilantin Indication: Seizure disorder  Allergies  Allergen Reactions  . 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 Morphine And Related Hives, Itching and Rash  . Norvasc [Amlodipine Besylate] Hives, Itching and Rash    Patient Measurements: Height: 5\' 5"  (165.1 cm) Weight: 172 lb 6.4 oz (78.2 kg) IBW/kg (Calculated) : 57 Adjusted Body Weight:   Vital Signs: Temp: 98.2 F (36.8 C) (08/08 1402) Temp src: Oral (08/08 1402) BP: 170/80 mmHg (08/08 1402) Pulse Rate: 77 (08/08 1402) Intake/Output from previous day:   Intake/Output from this shift:    Labs:  Recent Labs  01/03/14 2115 01/04/14 0730  WBC 4.6 3.5*  HGB 7.8* 8.2*  HCT 24.6* 26.2*  PLT 171 163  APTT  --  26  CREATININE 2.44* 2.30*  ALBUMIN 2.0* 1.9*  PROT 5.4* 5.3*  AST 61* 63*  ALT 55* 54*  ALKPHOS 500* 464*  BILITOT 1.5* 1.8*   Estimated Creatinine Clearance: 28.9 ml/min (by C-G formula based on Cr of 2.3).   Microbiology: Recent Results (from the past 720 hour(s))  URINE CULTURE     Status: None   Collection Time    12/17/13  9:55 AM      Result Value Ref Range Status   Specimen Description URINE, CATHETERIZED   Final   Special Requests NONE   Final   Culture  Setup Time     Final   Value: 12/17/2013 13:41     Performed at [facility] Count     Final   Value: NO GROWTH     Performed at [facility]   Culture     Final   Value: NO GROWTH     Performed at [facility]   Report Status 12/18/2013 FINAL   Final  CLOSTRIDIUM DIFFICILE BY PCR     Status: None   Collection Time    12/17/13  7:58 PM      Result Value Ref Range Status   C difficile by pcr NEGATIVE  NEGATIVE Final  URINE CULTURE     Status: None   Collection Time    12/20/13  6:24 PM      Result Value Ref Range Status   Specimen Description URINE, CLEAN CATCH    Final   Special Requests NONE   Final   Culture  Setup Time     Final   Value: 12/21/2013 01:19     Performed at [facility] Count     Final   Value: >=100,000 COLONIES/ML     Performed at [facility]   Culture     Final   Value: Multiple bacterial morphotypes present, none predominant. Suggest appropriate recollection if clinically indicated.     Performed at [facility]   Report Status 12/22/2013 FINAL   Final  MRSA PCR SCREENING     Status: Abnormal   Collection Time    01/04/14  4:35 AM      Result Value Ref Range Status   MRSA by PCR POSITIVE (*) NEGATIVE Final   Comment:            The GeneXpert MRSA Assay (FDA     approved for NASAL specimens     only), is one component of a     comprehensive MRSA colonization     surveillance program. It  is not     intended to diagnose MRSA     infection nor to guide or     monitor treatment for     MRSA infections.     RESULT CALLED TO, READ BACK BY AND VERIFIED WITH:     R.ZELLNER,RN [id number] 01/04/14 M.CAMPBELL    Medical History: Past Medical History  Diagnosis Date  . Stroke   . Meningitis   . HIV (human immunodeficiency virus infection) 1981  . Hypertension   . Gout   . Muscle spasms of head and/or neck   . CHF (congestive heart failure)     [name]/notes 06/18/2013  . HCV (hepatitis C virus)     chronic/notes 06/18/2013  . Type II diabetes mellitus     [name]/notes 06/18/2013  . AIHA (autoimmune hemolytic anemia)     [name]/notes 06/18/2013  . Hypertensive encephalopathy ~ 05/2013    hospitalaized/notes 06/18/2013  . Exertional shortness of breath     [name]/notes 06/18/2013  . Anxiety     [name]/notes 06/18/2013  . History of syphilis   . High cholesterol   . CKD (chronic kidney disease), stage III   . Nephrotic syndrome   . Cirrhosis     hepatitis C   . GERD (gastroesophageal reflux disease)   . Migraine     "all the time" (11/27/2013)  . Seizures     "last sz was week before last" (11/27/2013)  . Psychosis   .  Chronic venous stasis dermatitis of both lower extremities     Medications:  Scheduled:  . abacavir  300 mg Oral BID  . acidophilus  2 capsule Oral Daily  . acyclovir  800 mg Oral Daily  . aspirin EC  81 mg Oral Daily  . cloNIDine  0.3 mg Oral TID  . Darunavir Ethanolate  800 mg Oral Q breakfast  . enoxaparin (LOVENOX) injection  30 mg Subcutaneous Q24H  . hydrALAZINE  50 mg Oral TID  . insulin aspart  0-9 Units Subcutaneous 6 times per day  . insulin glargine  20 Units Subcutaneous QHS  . labetalol  400 mg Oral BID  . lactulose  20 g Oral TID  . lactulose  300 mL Rectal STAT  . lamiVUDine  150 mg Oral Daily  . levETIRAcetam  1,000 mg Oral BID  . lisinopril  40 mg Oral Daily  . multivitamin with minerals  1 tablet Oral Daily  . pantoprazole  40 mg Oral Daily  . phenytoin  300-400 mg Oral BID  . potassium chloride  10 mEq Intravenous Q1 Hr x 3  . ritonavir  100 mg Oral Q breakfast  . sodium bicarbonate  650 mg Oral BID  . sodium chloride  3 mL Intravenous Q12H    Assessment: 53yo female with seizure disorder as well as HIV(+).  LFTs are elevated with NH3 161; pt with presumed hepatic encephalopathy.  Recommended a Dilantin level as well, which resulted as 16.4, correcting to 34.2 when accounting for low albumin of 1.9.  She has had no Dilantin since admission 8/7 afternoon.  Pt's home dose is 300mg  in the AM and 400mg  in the PM, which seems high though pt is unable to verify at this time.  Expect some of presentation may be due to Dilantin.  Goal of Therapy:  Dilantin level 10-20 and prevention of seizures  Plan:  1-  Hold DIlantin 2-  Repeat level with AM labs 3-  F/U home dose when pt more alert  Marisue HumbleKendra Ilan Kahrs, PharmD  Clinical Pharmacist [facility]

## 2014-01-04 NOTE — H&P (Signed)
Date: 01/04/2014               Patient Name:  Michelle [name] MRN: [id number]  DOB: 02/17/1961 Age / Sex: 53 y.o., female   PCP: Michelle BameNora Sadek, MD         Medical Service: Internal Medicine Teaching Service         Attending Physician: Dr. Judyann Munsonynthia Snider, MD    First Contact: Michelle [name] Pager: [phone]  Second Contact: Dr. Garald [name] Pager: [phone]       After Hours (After 5p/  First Contact Pager: [phone]  weekends / holidays): Second Contact Pager: [phone]   Chief Complaint: Somnolence  History of Present Illness:  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 She states that she has been taking her lactulose with last BM the morning of her presentation. She denies increased somnolence and reports being hungry. She also denies increased abdominal distention or abdominal pain.  She was admitted last night to the [facility] service but transferred today to the [facility] as she is an IM-clinic patient.   Meds: Current Facility-Administered Medications  Medication Dose Route Frequency Provider Last Rate Last Dose  . abacavir (ZIAGEN) tablet 300 mg  300 mg Oral BID Michelle Swayze, DO   300 mg at 01/04/14 1033  . acidophilus (RISAQUAD) capsule 2 capsule  2 capsule Oral Daily Michelle Swayze, DO   2 capsule at 01/04/14 1034  . acyclovir (ZOVIRAX) tablet 800 mg  800 mg Oral Daily Michelle Swayze, DO   800 mg at 01/04/14 1034  . aspirin EC tablet 81 mg  81 mg Oral Daily Michelle Swayze, DO   81 mg at 01/04/14 1034  . cloNIDine (CATAPRES) tablet 0.3 mg  0.3 mg Oral TID Michelle Swayze, DO   0.3 mg at 01/04/14 1614  . Darunavir Ethanolate (PREZISTA) tablet 800 mg  800 mg Oral Q breakfast Michelle Swayze, DO   800 mg at 01/04/14 0835  . enoxaparin (LOVENOX) injection 30 mg  30 mg Subcutaneous Q24H Michelle Swayze, DO   30 mg at 01/04/14 1033  .  hydrALAZINE (APRESOLINE) tablet 50 mg  50 mg Oral TID Michelle Swayze, DO   50 mg at 01/04/14 1614  . hydrOXYzine (ATARAX/VISTARIL) tablet 10 mg  10 mg Oral TID PRN Michelle BarbanSolianny D Kimmberly Wisser, MD   10 mg at 01/04/14 1615  . insulin aspart (novoLOG) injection 0-9 Units  0-9 Units Subcutaneous 6 times per day Michelle BarbanSolianny D Rebbeca Sheperd, MD   2 Units at 01/04/14 1627  . insulin glargine (LANTUS) injection 20 Units  20 Units Subcutaneous QHS Michelle BarbanSolianny D Dondrell Loudermilk, MD      . labetalol (NORMODYNE) tablet 400 mg  400 mg Oral BID Michelle Swayze, DO   400 mg at 01/04/14 1033  . lactulose (CHRONULAC) 10 GM/15ML solution 20 g  20 g Oral TID Michelle Swayze, DO   20 g at 01/04/14 1614  . lamiVUDine (EPIVIR) tablet 150 mg  150 mg Oral Daily Michelle Swayze, DO   150 mg at 01/04/14 1034  . levETIRAcetam (KEPPRA) tablet 1,000 mg  1,000 mg Oral BID Michelle Swayze, DO   1,000 mg at 01/04/14 1033  . lisinopril (PRINIVIL,ZESTRIL) tablet 40 mg  40 mg Oral Daily Michelle Swayze, DO   40 mg at 01/04/14 1033  .  multivitamin with minerals tablet 1 tablet  1 tablet Oral Daily Michelle Swayze, DO   1 tablet at 01/04/14 1033  . ondansetron (ZOFRAN) tablet 4 mg  4 mg Oral Q6H PRN Michelle Swayze, DO       Or  . ondansetron (ZOFRAN) injection 4 mg  4 mg Intravenous Q6H PRN Michelle Swayze, DO      . pantoprazole (PROTONIX) EC tablet 40 mg  40 mg Oral Daily Michelle Swayze, DO   40 mg at 01/04/14 1035  . ritonavir (NORVIR) capsule 100 mg  100 mg Oral Q breakfast Michelle Swayze, DO   100 mg at 01/04/14 0935  . sodium bicarbonate tablet 650 mg  650 mg Oral BID Michelle Swayze, DO   650 mg at 01/04/14 1033  . sodium chloride 0.9 % injection 3 mL  3 mL Intravenous Q12H Michelle Swayze, DO   3 mL at 01/04/14 1034    Allergies: Allergies as of 01/03/2014 - Review Complete 01/03/2014  Allergen Reaction Noted  . Ceftriaxone Other (See Comments) 06/01/2013  . Morphine and related Hives, Itching, and Rash 02/15/2013  . Norvasc [amlodipine besylate] Hives, Itching, and Rash 03/30/2013   Past Medical History   Diagnosis Date  . Stroke   . Meningitis   . HIV (human immunodeficiency virus infection) 1981  . Hypertension   . Gout   . Muscle spasms of head and/or neck   . CHF (congestive heart failure)     Michelle [name] 06/18/2013  . HCV (hepatitis C virus)     chronic/notes 06/18/2013  . Type II diabetes mellitus     Michelle [name] 06/18/2013  . AIHA (autoimmune hemolytic anemia)     Michelle [name] 06/18/2013  . Hypertensive encephalopathy ~ 05/2013    hospitalaized/notes 06/18/2013  . Exertional shortness of breath     Michelle [name] 06/18/2013  . Anxiety     Michelle [name] 06/18/2013  . History of syphilis   . High cholesterol   . CKD (chronic kidney disease), stage III   . Nephrotic syndrome   . Cirrhosis     hepatitis C   . GERD (gastroesophageal reflux disease)   . Migraine     "all the time" (11/27/2013)  . Seizures     "last sz was week before last" (11/27/2013)  . Psychosis   . Chronic venous stasis dermatitis of both lower extremities    Past Surgical History  Procedure Laterality Date  . Hip pinning Right 1980's  . Av fistula placement Right 07/24/2013    Procedure: RIGHT arm exploration of antecubital space;  Surgeon: Michelle Kerns, MD;  Location: [facility] OR;  Service: Vascular;  Laterality: Right;  . Portacath placement  11/27/2013  . Colonoscopy Michelle [name] 12/19/2013    Procedure: COLONOSCOPY;  Surgeon: Michelle Boop, MD;  Location: [facility] ENDOSCOPY;  Service: Endoscopy;  Laterality: Michelle [name];  . Esophagogastroduodenoscopy Michelle [name] 12/19/2013    Procedure: ESOPHAGOGASTRODUODENOSCOPY (EGD);  Surgeon: Michelle Boop, MD;  Location: [facility] ENDOSCOPY;  Service: Endoscopy;  Laterality: Michelle [name];   Family History  Problem Relation Age of Onset  . Cancer - Colon Mother   . Cancer Father   . Hypertension Father   . Diabetes    . Diabetes Sister    History   Social History  . Marital Status: Single    Spouse Name: Michelle [name]    Number of Children: 4  . Years of Education: 11th   Occupational History  . Not on file.   Social History Main Topics  .  Smoking status: Former Smoker  Types: Cigarettes    Quit date: 06/19/2010  . Smokeless tobacco: Never Used  . Alcohol Use: No  . Drug Use: Yes    Special: Cocaine, Marijuana, "Crack" cocaine     Comment: past history of alcohol, cocaine and IV drug use; "stopped drugs in 2012"  . Sexual Activity: Not Currently    Partners: Male   Other Topics Concern  . Not on file   Social History Narrative   Patient lives at home with sister.    Patient is unemployed.    Patient is single.    Patient has 2 alive and 2 deceased.    Patient has 11th grade education.           Review of Systems: Review of Systems  Constitutional: Negative for fever, chills, malaise/fatigue and diaphoresis.  Respiratory: Negative for cough and shortness of breath.   Cardiovascular: Negative for chest pain and leg swelling.  Gastrointestinal: Positive for diarrhea. Negative for nausea, vomiting, abdominal pain, constipation and blood in stool.  Genitourinary: Negative for dysuria and frequency.  Skin: Positive for itching. Negative for rash.  Neurological: Negative for dizziness, tremors, seizures, weakness and headaches.    Physical Exam: Blood pressure 120/70, pulse 77, temperature 98.6 F (37 C), temperature source Oral, resp. rate 20, height 5\' 5"  (1.651 m), weight 172 lb 6.4 oz (78.2 kg), SpO2 98.00%. Physical Exam  Nursing note and vitals reviewed. Constitutional: She is oriented to person, place, and time. She appears well-developed and well-nourished. No distress.  Sitting up in bed, conversant  HENT:  Dry MM  Eyes: Conjunctivae are normal. No scleral icterus.  Cardiovascular: Normal rate and regular rhythm.   Respiratory: Effort normal and breath sounds normal. No respiratory distress. She has no wheezes. She has no rales.  GI: Soft. Bowel sounds are normal. She exhibits distension. There is no tenderness. There is no rebound and no guarding.  She has mild ascites with no TTP, abdomen is soft    Musculoskeletal: She exhibits no edema and no tenderness.  Neurological: She is alert and oriented to person, place, and time.  No asterixis. Normal finger-to-nose test   Skin: Skin is warm and dry. She is not diaphoretic. No erythema.  Several excoriation marks in her bilateral arms with dry skin  Psychiatric: She has a normal mood and affect.  Somewhat irritable      Lab results: Basic Metabolic Panel:  Recent Labs  16/10/96 2115 01/04/14 0730  NA 139 142  K 3.8 3.0*  CL 107 108  CO2 17* 18*  GLUCOSE 344* 91  BUN 63* 58*  CREATININE 2.44* 2.30*  CALCIUM 6.7* 6.9*   Liver Function Tests:  Recent Labs  01/03/14 2115 01/04/14 0730  AST 61* 63*  ALT 55* 54*  ALKPHOS 500* 464*  BILITOT 1.5* 1.8*  PROT 5.4* 5.3*  ALBUMIN 2.0* 1.9*    Recent Labs  01/03/14 2115  LIPASE 116*    Recent Labs  01/04/14 0100 01/04/14 0730  AMMONIA 127* 161*   CBC:  Recent Labs  01/03/14 2115 01/04/14 0730  WBC 4.6 3.5*  HGB 7.8* 8.2*  HCT 24.6* 26.2*  MCV 107.9* 109.6*  PLT 171 163   CBG:  Recent Labs  01/03/14 2054 01/04/14 0417 01/04/14 0743 01/04/14 1202 01/04/14 1622 01/04/14 2004  GLUCAP 335* 134* 102* 136* 192* 330*   Anemia Panel:  Recent Labs  01/04/14 0730  VITAMINB12 564  FERRITIN 611*  TIBC 180*  IRON 90   Coagulation:  Recent Labs  01/04/14 0730  LABPROT 12.4  INR 0.92   Urine Drug Screen: Drugs of Abuse     Component Value Date/Time   LABOPIA NONE DETECTED 01/03/2014 2311   LABOPIA NEG 11/13/2013 1516   COCAINSCRNUR NONE DETECTED 01/03/2014 2311   COCAINSCRNUR NEG 11/13/2013 1516   LABBENZ NONE DETECTED 01/03/2014 2311   LABBENZ NEG 11/13/2013 1516   AMPHETMU NONE DETECTED 01/03/2014 2311   THCU NONE DETECTED 01/03/2014 2311   LABBARB NONE DETECTED 01/03/2014 2311   LABBARB NEG 11/13/2013 1516    Urinalysis:  Recent Labs  01/03/14 2311  COLORURINE YELLOW  LABSPEC 1.010  PHURINE 6.0  GLUCOSEU 100*  HGBUR NEGATIVE  BILIRUBINUR  NEGATIVE  KETONESUR NEGATIVE  PROTEINUR >300*  UROBILINOGEN 1.0  NITRITE NEGATIVE  LEUKOCYTESUR NEGATIVE    Imaging results:  Ct Abdomen Pelvis Wo Contrast  01/04/2014   CLINICAL DATA:  Distended abdomen, jaundice, difficulty urinating, altered mental status  EXAM: CT ABDOMEN AND PELVIS WITHOUT CONTRAST  TECHNIQUE: Multidetector CT imaging of the abdomen and pelvis was performed following the standard protocol without IV contrast.  COMPARISON:  Radiographs 12/17/2013  FINDINGS: Bilateral subsegmental atelectasis at the lung bases. No pleural or pericardial effusion.  Mildly nodular contour to the liver. Gallbladder is normal. Noncontrast appearance suggests the possibility of an element periportal edema. Small volume of ascites around the liver.  Spleen is normal. Pancreas is normal. Adrenal glands are normal. Kidneys are normal. Abdominal aorta shows minimal calcification with no distention.  Bladder is normal.  Reproductive organs are normal.  Stomach is decompressed which exaggerates gastric wall thickness. Allowing for this, gastric wall appears mildly thickened nonetheless. Small bowel is normal. Evaluation of the colon is difficult without IV contrast, with oral contrast not having reached the colon. The colon is not a dilated, but the does appear to be diffuse colonic wall thickening. Mild to moderate inflammatory change tracks along the entire colon.  There are no acute musculoskeletal abnormalities.  IMPRESSION: 1. Small volume of ascites. Mildly nodular liver contour. Findings suggest possibility of cirrhosis. 2. Stomach is decompressed but shows evidence of wall thickening. Possible gastritis. 3. Evidence of mild diffuse colon wall thickening with inflammatory change surrounding the entire colon. Diffuse colitis suspected.   Electronically Signed   By: Esperanza Heir M.D.   On: 01/04/2014 01:26   Ct Head Wo Contrast  01/04/2014   CLINICAL DATA:  Lethargy and altered mental status.  EXAM: CT  HEAD WITHOUT CONTRAST  TECHNIQUE: Contiguous axial images were obtained from the base of the skull through the vertex without intravenous contrast.  COMPARISON:  CT of the head performed 10/23/2013  FINDINGS: There is no evidence of acute infarction, mass lesion, or intra- or extra-axial hemorrhage on CT.  Prominence of the ventricles and sulci suggests mild cortical volume loss. Scattered periventricular white matter change likely reflects small vessel ischemic microangiopathy.  The brainstem and fourth ventricle are within normal limits. The basal ganglia are unremarkable in appearance. The cerebral hemispheres demonstrate grossly normal gray-white differentiation. No mass effect or midline shift is seen.  There is no evidence of fracture; visualized osseous structures are unremarkable in appearance. There is apparent mild bilateral proptosis. The paranasal sinuses and mastoid air cells are well-aerated. No significant soft tissue abnormalities are seen.  IMPRESSION: 1. No acute intracranial pathology seen on CT. 2. Mild cortical volume loss and scattered small vessel ischemic microangiopathy. 3. Apparent mild bilateral proptosis noted.   Electronically Signed   By: Beryle Beams.D.  On: 01/04/2014 01:25    Other results: EKG: normal EKG, normal sinus rhythm, unchanged from previous tracings.  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   Hepatic encephalopathy: Resolved. Per admission note from [facility], she arrived lethargic which was concerning for acute hepatic encephalopathy.  Ammonia level on admission was 127 and increased to 161. Per nursing report, she appeared somnolent this morning but was fully alert this afternoon during this interview.   -Continue home lactulose PO PRN titrated to 3BM/day,  if patient unable to swallow PO meds, will give lactulose enema  -Consider starting rifaximin -Diet heart healthy/ Carb mod only if fully alert  Ascites: Soft and obese abdomen, only mildly distended, not tense with no TTP. She denies increased abdominal distention and does not appear volume overloaded with no SOB/dyspnea/or new oxygen requirement. She is afebrile with no leucocytosis with SBP less likely at this time.  -Will consider diagnostic/therapeutic tap of ascitic fluid if she declines  Hypokalemia: K of 3.0 today in the setting of being NPO overnight.  -K IV x 3 runs  Hepatitis C: Genotype in December 2014. Genotype 1b, viral load of [id number], Log of 6.63.  -She will follow up with Dr. Drue Second in ID for possible treatment of Hep C  HIV disease: Well controlled with viral load undectable in 09/08/13. CD4 count in April at 280 though.  -Repeat CD4 -Continue home abacavir, prezista, norvir, and epivir -Continue acyclovir  HTN: BP mildly elevated in the 170/80 this afternoon but improved after home antihypertensives were given.  -continue home clonidine 0.3mg  BID, hydralazine 50mg  TID, labetalol 200mg  BID, lisinopril 40mg  daily.  -Lasix was held while she was NPO, will resume Lasix 40mg  BID tomorrow as pt appeared mildly hypovolemic on exam with dry MM. Will increase Lasix dose as needed.   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 Secondary to Hepatitis C infection. 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 Platelet and INR stable.  -CMET in am  History of seizure: She is on Dilantin at home which can be sedating and may have precipitated her somnolence no hx of new seizures. She is on Keppra and Dilantin  -Check Dilantin level -Continue Dilantin -Continue Keppra  DMII: Last Hg A1C 7.5% on 11/13/13. On Lantus 30 u qHS and Novolog 10-25 u actid at home.  -Lantus 20 units qHS -Sensitive SSI  Chronic Anemia: Likely anemia of chronic disease. Baseline Hg is ~8. Two weeks ago her  hemoglobin was lower around 6.7; since then it has been steadily increasing.  -CBC in am   CKD, stage 4: BL Scr of 2.4, Cr today of 2.3. -CMP in am   DVT prophylaxis:  Lovenox [place]  FEN: NSL K repletion as above, gentle given her CKD 4 HH/Carb mod  Dispo: Disposition is deferred at this time, awaiting improvement of current medical problems. Anticipated discharge in approximately 1-2 day(s).   The patient does have a current PCP Michelle Bame, MD) and does need an [facility] hospital follow-up appointment after discharge.  The patient does have transportation limitations that hinder transportation to clinic appointments.  Signed: Ky Barban, MD 01/04/2014, 8:13 PM

## 2014-01-04 NOTE — Progress Notes (Signed)
01/04/14 Lab called patient is positive for MRSA.

## 2014-01-04 NOTE — H&P (Signed)
Date: 01/04/2014               Patient Name:  Michelle [name] MRN: [id number]  DOB: 20-Jul-1960 Age / Sex: 53 y.o., female   PCP: Christen Bame, MD              Medical Service: Internal Medicine Teaching Service              Attending Physician: Dr. Judyann Munson, MD    First Contact: Lillia Carmel, MS 4 Pager: [phone]  Second Contact: Dr. Garald Braver Pager: [phone]  Third Contact Dr.  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 The patient was arouseable and attempted to answer a couple questions.  Patient responded in single word answers but was able to verbalize her own needs in full sentences (her need to use restroom). She endorsed first feeling tired yesterday. Unable to obtain a full history from patient.   Meds: Current Facility-Administered Medications  Medication Dose Route Frequency Provider Last Rate Last Dose  . abacavir (ZIAGEN) tablet 300 mg  300 mg Oral BID Ava Swayze, DO   300 mg at 01/04/14 1033  . acidophilus (RISAQUAD) capsule 2 capsule  2 capsule Oral Daily Ava Swayze, DO   2 capsule at 01/04/14 1034  . acyclovir (ZOVIRAX) tablet 800 mg  800 mg Oral Daily Ava Swayze, DO   800 mg at 01/04/14 1034  . aspirin EC tablet 81 mg  81 mg Oral Daily Ava Swayze, DO   81 mg at 01/04/14 1034  . cloNIDine (CATAPRES) tablet 0.3 mg  0.3 mg Oral TID Ava Swayze, DO   0.3 mg at 01/04/14 1614  . Darunavir Ethanolate (PREZISTA) tablet 800 mg  800 mg Oral Q breakfast Ava Swayze, DO   800 mg at 01/04/14 0835  . enoxaparin (LOVENOX) injection 30 mg  30 mg Subcutaneous Q24H Ava Swayze, DO   30 mg at 01/04/14 1033  . hydrALAZINE  (APRESOLINE) tablet 50 mg  50 mg Oral TID Ava Swayze, DO   50 mg at 01/04/14 1614  . hydrOXYzine (ATARAX/VISTARIL) tablet 10 mg  10 mg Oral TID PRN Ky Barban, MD   10 mg at 01/04/14 1615  . insulin aspart (novoLOG) injection 0-9 Units  0-9 Units Subcutaneous 6 times per day Ky Barban, MD   2 Units at 01/04/14 1627  . insulin glargine (LANTUS) injection 20 Units  20 Units Subcutaneous QHS Ky Barban, MD      . labetalol (NORMODYNE) tablet 400 mg  400 mg Oral BID Ava Swayze, DO   400 mg at 01/04/14 1033  . lactulose (CHRONULAC) 10 GM/15ML solution 20 g  20 g Oral TID Ava Swayze, DO   20 g at 01/04/14 1614  . lamiVUDine (EPIVIR) tablet 150 mg  150 mg Oral Daily Ava Swayze, DO   150 mg at 01/04/14 1034  . levETIRAcetam (KEPPRA) tablet 1,000 mg  1,000 mg Oral BID Ava Swayze, DO   1,000 mg at 01/04/14 1033  . lisinopril (PRINIVIL,ZESTRIL) tablet 40 mg  40 mg Oral Daily Ava Swayze, DO  40 mg at 01/04/14 1033  . multivitamin with minerals tablet 1 tablet  1 tablet Oral Daily Ava Swayze, DO   1 tablet at 01/04/14 1033  . ondansetron (ZOFRAN) tablet 4 mg  4 mg Oral Q6H PRN Ava Swayze, DO       Or  . ondansetron (ZOFRAN) injection 4 mg  4 mg Intravenous Q6H PRN Ava Swayze, DO      . pantoprazole (PROTONIX) EC tablet 40 mg  40 mg Oral Daily Ava Swayze, DO   40 mg at 01/04/14 1035  . potassium chloride 10 mEq in 100 mL IVPB  10 mEq Intravenous Q1 Hr x 3 Ky Barban, MD   10 mEq at 01/04/14 1619  . ritonavir (NORVIR) capsule 100 mg  100 mg Oral Q breakfast Ava Swayze, DO   100 mg at 01/04/14 0935  . sodium bicarbonate tablet 650 mg  650 mg Oral BID Ava Swayze, DO   650 mg at 01/04/14 1033  . sodium chloride 0.9 % injection 3 mL  3 mL Intravenous Q12H Ava Swayze, DO   3 mL at 01/04/14 1034    Allergies: Allergies as of 01/03/2014 - Review Complete 01/03/2014  Allergen Reaction Noted  . Ceftriaxone Other (See Comments) 06/01/2013  . Morphine and related Hives, Itching,  and Rash 02/15/2013  . Norvasc [amlodipine besylate] Hives, Itching, and Rash 03/30/2013   Past Medical History  Diagnosis Date  . Stroke   . Meningitis   . HIV (human immunodeficiency virus infection) 1981  . Hypertension   . Gout   . Muscle spasms of head and/or neck   . CHF (congestive heart failure)     [name] 06/18/2013  . HCV (hepatitis C virus)     chronic/notes 06/18/2013  . Type II diabetes mellitus     [name] 06/18/2013  . AIHA (autoimmune hemolytic anemia)     [name] 06/18/2013  . Hypertensive encephalopathy ~ 05/2013    hospitalaized/notes 06/18/2013  . Exertional shortness of breath     [name] 06/18/2013  . Anxiety     [name] 06/18/2013  . History of syphilis   . High cholesterol   . CKD (chronic kidney disease), stage III   . Nephrotic syndrome   . Cirrhosis     hepatitis C   . GERD (gastroesophageal reflux disease)   . Migraine     "all the time" (11/27/2013)  . Seizures     "last sz was week before last" (11/27/2013)  . Psychosis   . Chronic venous stasis dermatitis of both lower extremities    Past Surgical History  Procedure Laterality Date  . Hip pinning Right 1980's  . Av fistula placement Right 07/24/2013    Procedure: RIGHT arm exploration of antecubital space;  Surgeon: Sherren Kerns, MD;  Location[facility] OR;  Service: Vascular;  Laterality: Right;  . Portacath placement  11/27/2013  . Colonoscopy N/A 12/19/2013    Procedure: COLONOSCOPY;  Surgeon: Iva Boop, MD;  Location[facility] ENDOSCOPY;  Service: Endoscopy;  Laterality: N/A;  . Esophagogastroduodenoscopy N/A 12/19/2013    Procedure: ESOPHAGOGASTRODUODENOSCOPY (EGD);  Surgeon: Iva Boop, MD;  Location: [facility] ENDOSCOPY;  Service: Endoscopy;  Laterality: N/A;   Family History  Problem Relation Age of Onset  . Cancer - Colon Mother   . Cancer Father   . Hypertension Father   . Diabetes    . Diabetes Sister    History   Social History  . Marital Status: Single    Spouse Name:  N/A    Number of  Children: 4  . Years of Education: 11th   Occupational History  . Not on file.   Social History Main Topics  . Smoking status: Former Smoker    Types: Cigarettes    Quit date: 06/19/2010  . Smokeless tobacco: Never Used  . Alcohol Use: No  . Drug Use: Yes    Special: Cocaine, Marijuana, "Crack" cocaine     Comment: past history of alcohol, cocaine and IV drug use; "stopped drugs in 2012"  . Sexual Activity: Not Currently    Partners: Male   Other Topics Concern  . Not on file   Social History Narrative   Patient lives at home with sister.    Patient is unemployed.    Patient is single.    Patient has 2 alive and 2 deceased.    Patient has 11th grade education.           Review of Systems: Unable to assess due to patient being unable to answer questions.   Physical Exam: Blood pressure 170/80, pulse 77, temperature 98.2 F (36.8 C), temperature source Oral, resp. rate 20, height 5\' 5"  (1.651 m), weight 78.2 kg (172 lb 6.4 oz), SpO2 100.00%. Constitutional: Patient lying in bed.  Head: Normocephalic and atraumatic.  Cardiovascular: RRR, no murmurs, rubs, or gallops, no carotid bruits  Pulmonary/Chest: Normal respiratory effort, loud breath sounds, no wheezes or rales. Unable to assess lung fields due to patient no being able to cooperate.  Abdominal: Soft. Fluid wave present. Unable to assess bowel sounds. Non-tender, mildly distended.  Neurological: Patient unable to participate in full neurological exam. Oriented to person and place. Unable to assess if she is oriented to time.   Lab results: Basic Metabolic Panel:  Recent Labs  29/56/2106/12/12 2115 01/04/14 0730  NA 139 142  K 3.8 3.0*  CL 107 108  CO2 17* 18*  GLUCOSE 344* 91  BUN 63* 58*  CREATININE 2.44* 2.30*  CALCIUM 6.7* 6.9*   Liver Function Tests:  Recent Labs  01/03/14 2115 01/04/14 0730  AST 61* 63*  ALT 55* 54*  ALKPHOS 500* 464*  BILITOT 1.5* 1.8*  PROT 5.4* 5.3*  ALBUMIN 2.0* 1.9*     Recent Labs  01/03/14 2115  LIPASE 116*    Recent Labs  01/04/14 0100 01/04/14 0730  AMMONIA 127* 161*   CBC:  Recent Labs  01/03/14 2115 01/04/14 0730  WBC 4.6 3.5*  HGB 7.8* 8.2*  HCT 24.6* 26.2*  MCV 107.9* 109.6*  PLT 171 163   CBG:  Recent Labs  01/03/14 2054 01/04/14 0417 01/04/14 0743 01/04/14 1202 01/04/14 1622  GLUCAP 335* 134* 102* 136* 192*   Anemia Panel:  Recent Labs  01/04/14 0730  VITAMINB12 564  FERRITIN 611*   Coagulation:  Recent Labs  01/04/14 0730  LABPROT 12.4  INR 0.92   Urine Drug Screen: Drugs of Abuse     Component Value Date/Time   LABOPIA NONE DETECTED 01/03/2014 2311   LABOPIA NEG 11/13/2013 1516   COCAINSCRNUR NONE DETECTED 01/03/2014 2311   COCAINSCRNUR NEG 11/13/2013 1516   LABBENZ NONE DETECTED 01/03/2014 2311   LABBENZ NEG 11/13/2013 1516   AMPHETMU NONE DETECTED 01/03/2014 2311   THCU NONE DETECTED 01/03/2014 2311   LABBARB NONE DETECTED 01/03/2014 2311   LABBARB NEG 11/13/2013 1516    Urinalysis:  Recent Labs  01/03/14 2311  COLORURINE YELLOW  LABSPEC 1.010  PHURINE 6.0  GLUCOSEU 100*  HGBUR NEGATIVE  BILIRUBINUR NEGATIVE  KETONESUR NEGATIVE  PROTEINUR >300*  UROBILINOGEN 1.0  NITRITE NEGATIVE  LEUKOCYTESUR NEGATIVE     Imaging results:  Ct Abdomen Pelvis Wo Contrast  01/04/2014   CLINICAL DATA:  Distended abdomen, jaundice, difficulty urinating, altered mental status  EXAM: CT ABDOMEN AND PELVIS WITHOUT CONTRAST  TECHNIQUE: Multidetector CT imaging of the abdomen and pelvis was performed following the standard protocol without IV contrast.  COMPARISON:  Radiographs 12/17/2013  FINDINGS: Bilateral subsegmental atelectasis at the lung bases. No pleural or pericardial effusion.  Mildly nodular contour to the liver. Gallbladder is normal. Noncontrast appearance suggests the possibility of an element periportal edema. Small volume of ascites around the liver.  Spleen is normal. Pancreas is normal. Adrenal  glands are normal. Kidneys are normal. Abdominal aorta shows minimal calcification with no distention.  Bladder is normal.  Reproductive organs are normal.  Stomach is decompressed which exaggerates gastric wall thickness. Allowing for this, gastric wall appears mildly thickened nonetheless. Small bowel is normal. Evaluation of the colon is difficult without IV contrast, with oral contrast not having reached the colon. The colon is not a dilated, but the does appear to be diffuse colonic wall thickening. Mild to moderate inflammatory change tracks along the entire colon.  There are no acute musculoskeletal abnormalities.  IMPRESSION: 1. Small volume of ascites. Mildly nodular liver contour. Findings suggest possibility of cirrhosis. 2. Stomach is decompressed but shows evidence of wall thickening. Possible gastritis. 3. Evidence of mild diffuse colon wall thickening with inflammatory change surrounding the entire colon. Diffuse colitis suspected.   Electronically Signed   By: Esperanza Heir M.D.   On: 01/04/2014 01:26   Ct Head Wo Contrast  01/04/2014   CLINICAL DATA:  Lethargy and altered mental status.  EXAM: CT HEAD WITHOUT CONTRAST  TECHNIQUE: Contiguous axial images were obtained from the base of the skull through the vertex without intravenous contrast.  COMPARISON:  CT of the head performed 10/23/2013  FINDINGS: There is no evidence of acute infarction, mass lesion, or intra- or extra-axial hemorrhage on CT.  Prominence of the ventricles and sulci suggests mild cortical volume loss. Scattered periventricular white matter change likely reflects small vessel ischemic microangiopathy.  The brainstem and fourth ventricle are within normal limits. The basal ganglia are unremarkable in appearance. The cerebral hemispheres demonstrate grossly normal gray-white differentiation. No mass effect or midline shift is seen.  There is no evidence of fracture; visualized osseous structures are unremarkable in appearance.  There is apparent mild bilateral proptosis. The paranasal sinuses and mastoid air cells are well-aerated. No significant soft tissue abnormalities are seen.  IMPRESSION: 1. No acute intracranial pathology seen on CT. 2. Mild cortical volume loss and scattered small vessel ischemic microangiopathy. 3. Apparent mild bilateral proptosis noted.   Electronically Signed   By: Roanna Raider M.D.   On: 01/04/2014 01:25    Other results: EKG: normal EKG, normal sinus rhythm, unchanged from previous tracings.  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  Hepatic encephalopathy: Patient arrived lethargic and has a history of hepatic encephalopathy complicated by seizures. Ammonia level on admission was 127 and increased to 161. Inconsistent level of somnolence during interview, will need to reassess. -Will give lactulose PO, if patient unable to swallow PO meds, will give lactulose enema -Will re-check ammonia level  Ascites: mildly distended. Non-tense abdomin. Patient does not clinically appear to be using increased respiratory effort. Patient does not have a fever.  -Will consider diagnostic/therapeutic tap of ascitic fluid  Hepatitis C: chronic. Will continue home medications.  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 Secondary to Hepatitis C infection, which is currently being managed. 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 -Chem 7  DMII: SSI. Patient glucose will be monitored. Last glucose was 102.  Chronic Anemia: Patient's anemia has  been longstanding. Today her hemoglobin is 8.2. Two weeks ago her hemoglobin was lower around 6.7; since then it has been steadily increasing. If patient's Hg drops below 6, will consider transfusion. Ferritin is 611.  -Iron studies -Will continue to monitor Hg and Hct.   CKD, stage 4: GFR 21 today. Will continue to monitor.  This is a Psychologist, occupational Note.  The care of the patient was discussed with Dr. Garald Braver and the assessment and plan was formulated with their assistance.  Please see their note for official documentation of the patient encounter.   Signed: Lillia Carmel, Med Student 01/04/2014, 4:45 PM

## 2014-01-04 NOTE — Progress Notes (Signed)
Report taken from Ed, RN in [facility].

## 2014-01-05 ENCOUNTER — Inpatient Hospital Stay (HOSPITAL_COMMUNITY): Payer: Medicaid Other

## 2014-01-05 DIAGNOSIS — D649 Anemia, unspecified: Secondary | ICD-10-CM

## 2014-01-05 DIAGNOSIS — I129 Hypertensive chronic kidney disease with stage 1 through stage 4 chronic kidney disease, or unspecified chronic kidney disease: Secondary | ICD-10-CM

## 2014-01-05 DIAGNOSIS — E119 Type 2 diabetes mellitus without complications: Secondary | ICD-10-CM

## 2014-01-05 DIAGNOSIS — B20 Human immunodeficiency virus [HIV] disease: Secondary | ICD-10-CM

## 2014-01-05 DIAGNOSIS — B1920 Unspecified viral hepatitis C without hepatic coma: Secondary | ICD-10-CM

## 2014-01-05 DIAGNOSIS — R509 Fever, unspecified: Secondary | ICD-10-CM | POA: Diagnosis present

## 2014-01-05 LAB — CBC
HCT: 24.5 % — ABNORMAL LOW (ref 36.0–46.0)
Hemoglobin: 7.9 g/dL — ABNORMAL LOW (ref 12.0–15.0)
MCH: 35 pg — AB (ref 26.0–34.0)
MCHC: 32.2 g/dL (ref 30.0–36.0)
MCV: 108.4 fL — AB (ref 78.0–100.0)
PLATELETS: 173 10*3/uL (ref 150–400)
RBC: 2.26 MIL/uL — ABNORMAL LOW (ref 3.87–5.11)
RDW: 14.4 % (ref 11.5–15.5)
WBC: 3.3 10*3/uL — ABNORMAL LOW (ref 4.0–10.5)

## 2014-01-05 LAB — GLUCOSE, CAPILLARY
GLUCOSE-CAPILLARY: 264 mg/dL — AB (ref 70–99)
GLUCOSE-CAPILLARY: 297 mg/dL — AB (ref 70–99)
Glucose-Capillary: 135 mg/dL — ABNORMAL HIGH (ref 70–99)
Glucose-Capillary: 146 mg/dL — ABNORMAL HIGH (ref 70–99)
Glucose-Capillary: 186 mg/dL — ABNORMAL HIGH (ref 70–99)
Glucose-Capillary: 266 mg/dL — ABNORMAL HIGH (ref 70–99)
Glucose-Capillary: 97 mg/dL (ref 70–99)

## 2014-01-05 LAB — COMPREHENSIVE METABOLIC PANEL
ALK PHOS: 485 U/L — AB (ref 39–117)
ALT: 56 U/L — AB (ref 0–35)
AST: 93 U/L — ABNORMAL HIGH (ref 0–37)
Albumin: 1.9 g/dL — ABNORMAL LOW (ref 3.5–5.2)
Anion gap: 14 (ref 5–15)
BUN: 53 mg/dL — ABNORMAL HIGH (ref 6–23)
CHLORIDE: 112 meq/L (ref 96–112)
CO2: 18 meq/L — AB (ref 19–32)
Calcium: 7.1 mg/dL — ABNORMAL LOW (ref 8.4–10.5)
Creatinine, Ser: 2.39 mg/dL — ABNORMAL HIGH (ref 0.50–1.10)
GFR calc Af Amer: 25 mL/min — ABNORMAL LOW (ref >90)
GFR, EST NON AFRICAN AMERICAN: 22 mL/min — AB (ref >90)
Glucose, Bld: 102 mg/dL — ABNORMAL HIGH (ref 70–99)
POTASSIUM: 3.5 meq/L — AB (ref 3.7–5.3)
Sodium: 144 mEq/L (ref 137–147)
Total Bilirubin: 1.9 mg/dL — ABNORMAL HIGH (ref 0.3–1.2)
Total Protein: 5 g/dL — ABNORMAL LOW (ref 6.0–8.3)

## 2014-01-05 LAB — PHENYTOIN LEVEL, TOTAL: Phenytoin Lvl: 12.2 ug/mL (ref 10.0–20.0)

## 2014-01-05 MED ORDER — FUROSEMIDE 40 MG PO TABS
40.0000 mg | ORAL_TABLET | Freq: Two times a day (BID) | ORAL | Status: DC
  Administered 2014-01-05 – 2014-01-06 (×3): 40 mg via ORAL
  Filled 2014-01-05 (×5): qty 1

## 2014-01-05 MED ORDER — HYDROXYZINE HCL 10 MG PO TABS
10.0000 mg | ORAL_TABLET | Freq: Three times a day (TID) | ORAL | Status: DC | PRN
  Administered 2014-01-05 – 2014-01-12 (×4): 10 mg via ORAL
  Filled 2014-01-05 (×9): qty 1

## 2014-01-05 MED ORDER — MENTHOL 3 MG MT LOZG
1.0000 | LOZENGE | OROMUCOSAL | Status: DC | PRN
  Administered 2014-01-05: 3 mg via ORAL
  Filled 2014-01-05: qty 9

## 2014-01-05 MED ORDER — ACETAMINOPHEN 325 MG PO TABS: 650.0000 mg | ORAL_TABLET | Freq: Once | ORAL | Status: AC

## 2014-01-05 MED ORDER — ACETAMINOPHEN 325 MG PO TABS
650.0000 mg | ORAL_TABLET | Freq: Four times a day (QID) | ORAL | Status: DC | PRN
  Administered 2014-01-05: 650 mg via ORAL
  Filled 2014-01-05: qty 2

## 2014-01-05 MED ORDER — ACETAMINOPHEN 500 MG PO TABS: 500.0000 mg | ORAL_TABLET | Freq: Four times a day (QID) | ORAL | Status: DC | PRN

## 2014-01-05 MED ORDER — HYDROCERIN EX CREA
TOPICAL_CREAM | Freq: Two times a day (BID) | CUTANEOUS | Status: DC
  Administered 2014-01-05 – 2014-01-08 (×6): via TOPICAL
  Administered 2014-01-09: 1 via TOPICAL
  Administered 2014-01-09 – 2014-01-10 (×2): via TOPICAL
  Administered 2014-01-10: 1 via TOPICAL
  Administered 2014-01-11: 23:00:00 via TOPICAL
  Administered 2014-01-11: 1 via TOPICAL
  Administered 2014-01-12: 23:00:00 via TOPICAL
  Administered 2014-01-12: 1 via TOPICAL
  Administered 2014-01-13: 10:00:00 via TOPICAL
  Administered 2014-01-14 – 2014-01-15 (×2): 1 via TOPICAL
  Filled 2014-01-05 (×2): qty 113

## 2014-01-05 MED ORDER — MAGIC MOUTHWASH W/LIDOCAINE
5.0000 mL | Freq: Three times a day (TID) | ORAL | Status: DC
  Administered 2014-01-06 – 2014-01-15 (×31): 5 mL via ORAL
  Filled 2014-01-05 (×31): qty 5

## 2014-01-05 NOTE — Progress Notes (Addendum)
  I have seen and examined the patient, and reviewed the daily progress note by Lillia Carmel, MS 4 and discussed the care of the patient with them. Please see my progress note from 01/05/2014 for further details regarding assessment and plan.    Signed:  Ky Barban, MD 01/05/2014, 3:03 PM

## 2014-01-05 NOTE — Progress Notes (Signed)
Subjective: Michelle [name] was able to converse fluently with only occasional hesitation today. She complains of a mild headache and neck pain which is reproducible by turning her head to the right. She also complains of a sore throat. States she has been having regular bowel movements and has been able to eat without issues.   Objective: Vital signs in last 24 hours: Filed Vitals:   01/04/14 1402 01/04/14 2006 01/04/14 2232 01/05/14 0436  BP: 170/80 120/70 153/91 160/82  Pulse: 77 77 72 73  Temp: 98.2 F (36.8 C) 98.6 F (37 C)  98.6 F (37 C)  TempSrc: Oral Oral  Oral  Resp: 20 20  20   Height:      Weight:      SpO2: 100% 98%  100%   Weight change:   Intake/Output Summary (Last 24 hours) at 01/05/14 1303 Last data filed at 01/05/14 0844  Gross per 24 hour  Intake    900 ml  Output      0 ml  Net    900 ml   Physical Exam: Gen: Lying in bed conversing with nurse HEENT: atraumatic, normocephalic CV: RRR, no murmurs, rubs, or gallops. Normal S1 and S2. No appreciable JVD.  Resp: CTAB, normal respiratory effort Abd: mildly distended, bowel sounds present, no caput medusa Ext: no peripheral edema, no asterixis Neuro: Alert and oriented x3, no slurred speech, CN II-XII grossly intact  Lab Results: Basic Metabolic Panel:  Recent Labs Lab 01/04/14 0730 01/05/14 0530  NA 142 144  K 3.0* 3.5*  CL 108 112  CO2 18* 18*  GLUCOSE 91 102*  BUN 58* 53*  CREATININE 2.30* 2.39*  CALCIUM 6.9* 7.1*   Liver Function Tests:  Recent Labs Lab 01/04/14 0730 01/05/14 0530  AST 63* 93*  ALT 54* 56*  ALKPHOS 464* 485*  BILITOT 1.8* 1.9*  PROT 5.3* 5.0*  ALBUMIN 1.9* 1.9*    Recent Labs Lab 01/03/14 2115  LIPASE 116*    Recent Labs Lab 01/04/14 0100 01/04/14 0730  AMMONIA 127* 161*   CBC:  Recent Labs Lab 01/04/14 0730 01/05/14 0530  WBC 3.5* 3.3*  HGB 8.2* 7.9*  HCT 26.2* 24.5*  MCV 109.6* 108.4*  PLT 163 173   CBG:  Recent Labs Lab 01/04/14 2004  01/04/14 2233 01/05/14 0031 01/05/14 0433 01/05/14 0737 01/05/14 1135  GLUCAP 330* 178* 135* 97 146* 264*   Coagulation:  Recent Labs Lab 01/04/14 0730  LABPROT 12.4  INR 0.92   Anemia Panel:  Recent Labs Lab 01/04/14 0730  VITAMINB12 564  FERRITIN 611*  TIBC 180*  IRON 90   Urine Drug Screen: Drugs of Abuse     Component Value Date/Time   LABOPIA NONE DETECTED 01/03/2014 2311   LABOPIA NEG 11/13/2013 1516   COCAINSCRNUR NONE DETECTED 01/03/2014 2311   COCAINSCRNUR NEG 11/13/2013 1516   LABBENZ NONE DETECTED 01/03/2014 2311   LABBENZ NEG 11/13/2013 1516   AMPHETMU NONE DETECTED 01/03/2014 2311   THCU NONE DETECTED 01/03/2014 2311   LABBARB NONE DETECTED 01/03/2014 2311   LABBARB NEG 11/13/2013 1516    Urinalysis:  Recent Labs Lab 01/03/14 2311  COLORURINE YELLOW  LABSPEC 1.010  PHURINE 6.0  GLUCOSEU 100*  HGBUR NEGATIVE  BILIRUBINUR NEGATIVE  KETONESUR NEGATIVE  PROTEINUR >300*  UROBILINOGEN 1.0  NITRITE NEGATIVE  LEUKOCYTESUR NEGATIVE   Micro Results: Recent Results (from the past 240 hour(s))  MRSA PCR SCREENING     Status: Abnormal   Collection Time    01/04/14  4:35 AM      Result Value Ref Range Status   MRSA by PCR POSITIVE (*) NEGATIVE Final   Comment:            The GeneXpert MRSA Assay (FDA     approved for NASAL specimens     only), is one component of a     comprehensive MRSA colonization     surveillance program. It is not     intended to diagnose MRSA     infection nor to guide or     monitor treatment for     MRSA infections.     RESULT CALLED TO, READ BACK BY AND VERIFIED WITH:     R.ZELLNER,RN [id number] 01/04/14 M.CAMPBELL   Studies/Results: Ct Abdomen Pelvis Wo Contrast  01/04/2014   CLINICAL DATA:  Distended abdomen, jaundice, difficulty urinating, altered mental status  EXAM: CT ABDOMEN AND PELVIS WITHOUT CONTRAST  TECHNIQUE: Multidetector CT imaging of the abdomen and pelvis was performed following the standard protocol without IV  contrast.  COMPARISON:  Radiographs 12/17/2013  FINDINGS: Bilateral subsegmental atelectasis at the lung bases. No pleural or pericardial effusion.  Mildly nodular contour to the liver. Gallbladder is normal. Noncontrast appearance suggests the possibility of an element periportal edema. Small volume of ascites around the liver.  Spleen is normal. Pancreas is normal. Adrenal glands are normal. Kidneys are normal. Abdominal aorta shows minimal calcification with no distention.  Bladder is normal.  Reproductive organs are normal.  Stomach is decompressed which exaggerates gastric wall thickness. Allowing for this, gastric wall appears mildly thickened nonetheless. Small bowel is normal. Evaluation of the colon is difficult without IV contrast, with oral contrast not having reached the colon. The colon is not a dilated, but the does appear to be diffuse colonic wall thickening. Mild to moderate inflammatory change tracks along the entire colon.  There are no acute musculoskeletal abnormalities.  IMPRESSION: 1. Small volume of ascites. Mildly nodular liver contour. Findings suggest possibility of cirrhosis. 2. Stomach is decompressed but shows evidence of wall thickening. Possible gastritis. 3. Evidence of mild diffuse colon wall thickening with inflammatory change surrounding the entire colon. Diffuse colitis suspected.   Electronically Signed   By: Esperanza Heir M.D.   On: 01/04/2014 01:26   Ct Head Wo Contrast  01/04/2014   CLINICAL DATA:  Lethargy and altered mental status.  EXAM: CT HEAD WITHOUT CONTRAST  TECHNIQUE: Contiguous axial images were obtained from the base of the skull through the vertex without intravenous contrast.  COMPARISON:  CT of the head performed 10/23/2013  FINDINGS: There is no evidence of acute infarction, mass lesion, or intra- or extra-axial hemorrhage on CT.  Prominence of the ventricles and sulci suggests mild cortical volume loss. Scattered periventricular white matter change likely  reflects small vessel ischemic microangiopathy.  The brainstem and fourth ventricle are within normal limits. The basal ganglia are unremarkable in appearance. The cerebral hemispheres demonstrate grossly normal gray-white differentiation. No mass effect or midline shift is seen.  There is no evidence of fracture; visualized osseous structures are unremarkable in appearance. There is apparent mild bilateral proptosis. The paranasal sinuses and mastoid air cells are well-aerated. No significant soft tissue abnormalities are seen.  IMPRESSION: 1. No acute intracranial pathology seen on CT. 2. Mild cortical volume loss and scattered small vessel ischemic microangiopathy. 3. Apparent mild bilateral proptosis noted.   Electronically Signed   By: Roanna Raider M.D.   On: 01/04/2014 01:25   Medications: I have reviewed the  patient's current medications. Scheduled Meds: . abacavir  300 mg Oral BID  . acyclovir  800 mg Oral Daily  . aspirin EC  81 mg Oral Daily  . Chlorhexidine Gluconate Cloth  6 each Topical Q0600  . cloNIDine  0.3 mg Oral TID  . Darunavir Ethanolate  800 mg Oral Q breakfast  . enoxaparin (LOVENOX) injection  30 mg Subcutaneous Q24H  . furosemide  40 mg Intravenous BID  . hydrALAZINE  50 mg Oral TID  . hydrocerin   Topical BID  . insulin aspart  0-9 Units Subcutaneous 6 times per day  . insulin glargine  20 Units Subcutaneous QHS  . labetalol  400 mg Oral BID  . lactulose  20 g Oral TID  . lamiVUDine  150 mg Oral Daily  . levETIRAcetam  1,000 mg Oral BID  . lisinopril  40 mg Oral Daily  . mupirocin ointment  1 application Nasal BID  . pantoprazole  40 mg Oral Daily  . ritonavir  100 mg Oral Q breakfast  . sodium bicarbonate  650 mg Oral BID  . sodium chloride  3 mL Intravenous Q12H   Continuous Infusions:  PRN Meds:.hydrOXYzine, menthol-cetylpyridinium, ondansetron (ZOFRAN) IV, ondansetron Assessment/Plan: Principal Problem:   Hepatic encephalopathy Active Problems:   HIV  disease   CKD (chronic kidney disease) stage 4, GFR 15-29 ml/min   Diabetes   HCV (hepatitis C virus)   Anemia   Metabolic acidosis, increased anion gap   Altered mental state   Ascites  Assessment & Plan by Problem:  Ms. 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   Hepatic Encephalopathy: Resolved. She arrived lethargic which was concerning for acute hepatic encephalopathy. Ammonia level on admission was 127 and increased to 161, but patient has been confluent today and is able converse fluently with occasional hesitation.  -Continue home lactulose PO prn titrated to 3BM/day -Diet heart healthy/ Carb mod only if fully alert   Ascites: Soft and obese abdomen, only mildly distended and not tense. She denies increased abdominal distention and does not appear volume overloaded.  She does not complain of SOB or dyspnea. She has been afebrile throughout hospital stay with no leukocytosis. Not concerned for SBP at this time.  -Will consider diagnostic/therapeutic tap of ascitic fluid if she declines   History of seizure: Patient takes Dilantin at home which can be sedating. This may have precipitated her somnolence, as her corrected level per pharmacy is 25. Pharmacy on board and recommend decreasing dilantin and getting repeat level after ~5 days. Also mention the possible need to adjust HIV medications due to concern for interaction.  -Discontinue Dilantin  -Dilantin level in AM -Continue Keppra  Hypokalemia: Resolved. K is 3.5 today after gentle repletion of K. Was 3.0 on admission.  Hepatitis C: Genotype in December 2014. Genotype 1b, viral load of [id number], Log of 6.63.  -She will f/u with Dr. Drue Second in ID for possible treatment of Hep C    HIV disease: Well controlled with viral load  undectable in 09/08/13. CD4 count in April at 280. Will recheck labs as dilantin level has been super therapeutic and can interact with antiretrovirals  -Repeat CD4  -Recheck viral load -Continue home abacavir, prezista, norvir, and epivir  -Continue acyclovir   HTN: BP mildly  elevated in the 170/80 this afternoon but improved after home antihypertensives were given.  -continue home clonidine 0.3mg  BID, hydralazine 50mg  TID, labetalol 200mg  BID, lisinopril 40mg  daily.  -Lasix was held while she was NPO, will resume Lasix 40mg  BID tomorrow as pt appeared mildly hypovolemic on exam with dry MM. Will increase Lasix dose as needed.   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 Secondary to Hepatitis C infection. 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 Platelets and INR stable.   DMII: Last Hg A1C 7.5% on 11/13/13. On Lantus 30 u qHS and Novolog 10-25 u actid at home.  -Lantus 20 units qHS  -Sensitive SSI   Chronic Anemia: Likely anemia of chronic disease. Her baseline Hg is ~8. Two weeks ago her hemoglobin was lower around 6.7; since then it has been steadily increasing. Today her HbG is 7.9.  CKD, stage 4: Stable. Cr today is 2.39, which seems to be her baseline. GFR is 25, also is close to baseline.   DVT prophylaxis: Lovenox Mila Doce   Diet:  HH/Carb mod   Dispo: Disposition is deferred at this time, awaiting improvement of current medical problems. Anticipated discharge in approximately 1-2 day(s).    This is a Psychologist, occupational Note.  The care of the patient was discussed with Dr. Garald Braver and the assessment and plan formulated with their assistance.  Please see their attached note for official documentation of the daily encounter.   LOS: 2 days   Lillia Carmel, Med Student 01/05/2014, 1:03 PM

## 2014-01-05 NOTE — H&P (Signed)
  Date: 01/05/2014  Patient name: Michelle [name]  Medical record number: [id number]  Date of birth: 12-Jan-1961   I have seen and evaluated Michelle [name] and discussed their care with the Residency Team.   Assessment and Plan: I have seen and evaluated the patient as outlined above. I agree with the formulated Assessment and Plan as detailed in the residents' admission note, with the following changes:   Michelle [name] has multiple medical problems. During this admission, we will optimize management of her hepatic encephalopathy but also consider changing her hiv regimen so that there is less drug interaction with dilantin for her anti-epileptics  Judyann Munson, MD 8/9/20152:01 PM

## 2014-01-05 NOTE — Progress Notes (Addendum)
MEDICATION RELATED CONSULT NOTE - FOLLOW UP   Pharmacy Consult for Dilantin Indication: seizure disorder  Allergies  Allergen Reactions  . 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 Morphine And Related Hives, Itching and Rash  . Norvasc [Amlodipine Besylate] Hives, Itching and Rash    Patient Measurements: Height: 5\' 5"  (165.1 cm) Weight: 172 lb 6.4 oz (78.2 kg) IBW/kg (Calculated) : 57 Adjusted Body Weight:   Vital Signs: Temp: 98.6 F (37 C) (08/09 0436) Temp src: Oral (08/09 0436) BP: 160/82 mmHg (08/09 0436) Pulse Rate: 73 (08/09 0436) Intake/Output from previous day: 08/08 0701 - 08/09 0700 In: 540 [P.O.:240; IV Piggyback:300] Out: -  Intake/Output from this shift: Total I/O In: 360 [P.O.:360] Out: -   Labs:  Recent Labs  01/03/14 2115 01/04/14 0730 01/05/14 0530  WBC 4.6 3.5* 3.3*  HGB 7.8* 8.2* 7.9*  HCT 24.6* 26.2* 24.5*  PLT 171 163 173  APTT  --  26  --   CREATININE 2.44* 2.30* 2.39*  ALBUMIN 2.0* 1.9* 1.9*  PROT 5.4* 5.3* 5.0*  AST 61* 63* 93*  ALT 55* 54* 56*  ALKPHOS 500* 464* 485*  BILITOT 1.5* 1.8* 1.9*   Estimated Creatinine Clearance: 27.8 ml/min (by C-G formula based on Cr of 2.39).   Microbiology: Recent Results (from the past 720 hour(s))  URINE CULTURE     Status: None   Collection Time    12/17/13  9:55 AM      Result Value Ref Range Status   Specimen Description URINE, CATHETERIZED   Final   Special Requests NONE   Final   Culture  Setup Time     Final   Value: 12/17/2013 13:41     Performed at [facility]   Colony Count     Final   Value: NO GROWTH     Performed at [facility]   Culture     Final   Value: NO GROWTH     Performed at [facility]   Report Status 12/18/2013 FINAL   Final  CLOSTRIDIUM DIFFICILE BY PCR     Status: None   Collection Time    12/17/13  7:58 PM      Result Value Ref Range Status   C difficile by pcr  NEGATIVE  NEGATIVE Final  URINE CULTURE     Status: None   Collection Time    12/20/13  6:24 PM      Result Value Ref Range Status   Specimen Description URINE, CLEAN CATCH   Final   Special Requests NONE   Final   Culture  Setup Time     Final   Value: 12/21/2013 01:19     Performed at [facility]   Colony Count     Final   Value: >=100,000 COLONIES/ML     Performed at [facility]   Culture     Final   Value: Multiple bacterial morphotypes present, none predominant. Suggest appropriate recollection if clinically indicated.     Performed at [facility]   Report Status 12/22/2013 FINAL   Final  MRSA PCR SCREENING     Status: Abnormal   Collection Time    01/04/14  4:35 AM      Result Value Ref Range Status   MRSA by PCR POSITIVE (*) NEGATIVE Final   Comment:            The GeneXpert MRSA  Assay (FDA     approved for NASAL specimens     only), is one component of a     comprehensive MRSA colonization     surveillance program. It is not     intended to diagnose MRSA     infection nor to guide or     monitor treatment for     MRSA infections.     RESULT CALLED TO, READ BACK BY AND VERIFIED WITH:     R.ZELLNER,RN [id number] 01/04/14 M.CAMPBELL    Medications:  Scheduled:  . abacavir  300 mg Oral BID  . acyclovir  800 mg Oral Daily  . aspirin EC  81 mg Oral Daily  . Chlorhexidine Gluconate Cloth  6 each Topical Q0600  . cloNIDine  0.3 mg Oral TID  . Darunavir Ethanolate  800 mg Oral Q breakfast  . enoxaparin (LOVENOX) injection  30 mg Subcutaneous Q24H  . furosemide  40 mg Intravenous BID  . hydrALAZINE  50 mg Oral TID  . insulin aspart  0-9 Units Subcutaneous 6 times per day  . insulin glargine  20 Units Subcutaneous QHS  . labetalol  400 mg Oral BID  . lactulose  20 g Oral TID  . lamiVUDine  150 mg Oral Daily  . levETIRAcetam  1,000 mg Oral BID  . lisinopril  40 mg Oral Daily  . mupirocin ointment  1 application Nasal BID  . pantoprazole  40 mg  Oral Daily  . ritonavir  100 mg Oral Q breakfast  . sodium bicarbonate  650 mg Oral BID  . sodium chloride  3 mL Intravenous Q12H    Assessment: 53yo female with HIV, presented with hepatic encephalopathy much improved today s/p Lactulose enema & now on Lactulose PO.  Dilantin is currently on hold due to marked accumulation on home dose, now down to 12.4 which corrects to 25 when taking into account low albumin.  No seizure activity has occurred.    She is receiving Keppra also, as at home though dose has changed according to Rx label that sister read to me.  Pt states she takes medications exactly as on Rx labels.  Goal of Therapy:  10-20, prevention of seizures  Plan:  1-  Dilantin level in AM 2-  Dosage will need decreased, a repeat level after this is done should be ~5 days 3-  Consider consulting ID re: increased LFTs/NH3 & possible need to adjust HIV medications 4-  Rec discussing need for Dilantin/Hepatic dysfxn with her neurologist    Marisue Humble, PharmD Clinical Pharmacist [facility]

## 2014-01-05 NOTE — Progress Notes (Signed)
Subjective: She complained of sorethroat this morning but no diarrhea, dysuria, cough, or rhinorrhea. She feels sore on her right arm and shoulder. Overall she feels tired but with improvement of her mental status.   Objective: Vital signs in last 24 hours: Filed Vitals:   01/04/14 2006 01/04/14 2232 01/05/14 0436 01/05/14 1354  BP: 120/70 153/91 160/82 133/74  Pulse: 77 72 73 98  Temp: 98.6 F (37 C)  98.6 F (37 C) 102.9 F (39.4 C)  TempSrc: Oral  Oral Oral  Resp: 20  20 20   Height:      Weight:      SpO2: 98%  100% 97%   Weight change:   Intake/Output Summary (Last 24 hours) at 01/05/14 1500 Last data filed at 01/05/14 0844  Gross per 24 hour  Intake    800 ml  Output      0 ml  Net    800 ml   Physical Exam:  Gen: Lying in bed in NAD, following commands appropriately  HEENT: no scleral icterus, mild TTP of bilateral anterior cervical nodes but no lymphadenopathy. Oropharynx with increased erythema but no tonsil exudates.  CV: RRR, no murmurs, rubs, or gallops. Normal S1 and S2. No appreciable JVD.  Resp: CTAB, normal respiratory effort  Abd: mildly distended, soft, bowel sounds present, no caput medusa  Ext: no peripheral edema, dry skin  Neuro: Alert and oriented x3, no slurred speech, CN II-XII intact, no asterixis  Lab Results: Basic Metabolic Panel:  Recent Labs Lab 01/04/14 0730 01/05/14 0530  NA 142 144  K 3.0* 3.5*  CL 108 112  CO2 18* 18*  GLUCOSE 91 102*  BUN 58* 53*  CREATININE 2.30* 2.39*  CALCIUM 6.9* 7.1*   Liver Function Tests:  Recent Labs Lab 01/04/14 0730 01/05/14 0530  AST 63* 93*  ALT 54* 56*  ALKPHOS 464* 485*  BILITOT 1.8* 1.9*  PROT 5.3* 5.0*  ALBUMIN 1.9* 1.9*    Recent Labs Lab 01/03/14 2115  LIPASE 116*    Recent Labs Lab 01/04/14 0100 01/04/14 0730  AMMONIA 127* 161*   CBC:  Recent Labs Lab 01/04/14 0730 01/05/14 0530  WBC 3.5* 3.3*  HGB 8.2* 7.9*  HCT 26.2* 24.5*  MCV 109.6* 108.4*  PLT 163  173   CBG:  Recent Labs Lab 01/04/14 2004 01/04/14 2233 01/05/14 0031 01/05/14 0433 01/05/14 0737 01/05/14 1135  GLUCAP 330* 178* 135* 97 146* 264*   Coagulation:  Recent Labs Lab 01/04/14 0730  LABPROT 12.4  INR 0.92   Anemia Panel:  Recent Labs Lab 01/04/14 0730  VITAMINB12 564  FERRITIN 611*  TIBC 180*  IRON 90   Urine Drug Screen: Drugs of Abuse     Component Value Date/Time   LABOPIA NONE DETECTED 01/03/2014 2311   LABOPIA NEG 11/13/2013 1516   COCAINSCRNUR NONE DETECTED 01/03/2014 2311   COCAINSCRNUR NEG 11/13/2013 1516   LABBENZ NONE DETECTED 01/03/2014 2311   LABBENZ NEG 11/13/2013 1516   AMPHETMU NONE DETECTED 01/03/2014 2311   THCU NONE DETECTED 01/03/2014 2311   LABBARB NONE DETECTED 01/03/2014 2311   LABBARB NEG 11/13/2013 1516    Alcohol Level: No results found for this basename: ETH,  in the last 168 hours Urinalysis:  Recent Labs Lab 01/03/14 2311  COLORURINE YELLOW  LABSPEC 1.010  PHURINE 6.0  GLUCOSEU 100*  HGBUR NEGATIVE  BILIRUBINUR NEGATIVE  KETONESUR NEGATIVE  PROTEINUR >300*  UROBILINOGEN 1.0  NITRITE NEGATIVE  LEUKOCYTESUR NEGATIVE   Micro Results: Recent Results (  from the past 240 hour(s))  MRSA PCR SCREENING     Status: Abnormal   Collection Time    01/04/14  4:35 AM      Result Value Ref Range Status   MRSA by PCR POSITIVE (*) NEGATIVE Final   Comment:            The GeneXpert MRSA Assay (FDA     approved for NASAL specimens     only), is one component of a     comprehensive MRSA colonization     surveillance program. It is not     intended to diagnose MRSA     infection nor to guide or     monitor treatment for     MRSA infections.     RESULT CALLED TO, READ BACK BY AND VERIFIED WITH:     R.ZELLNER,RN 1610 01/04/14 M.CAMPBELL   Studies/Results: Ct Abdomen Pelvis Wo Contrast  01/04/2014   CLINICAL DATA:  Distended abdomen, jaundice, difficulty urinating, altered mental status  EXAM: CT ABDOMEN AND PELVIS WITHOUT CONTRAST   TECHNIQUE: Multidetector CT imaging of the abdomen and pelvis was performed following the standard protocol without IV contrast.  COMPARISON:  Radiographs 12/17/2013  FINDINGS: Bilateral subsegmental atelectasis at the lung bases. No pleural or pericardial effusion.  Mildly nodular contour to the liver. Gallbladder is normal. Noncontrast appearance suggests the possibility of an element periportal edema. Small volume of ascites around the liver.  Spleen is normal. Pancreas is normal. Adrenal glands are normal. Kidneys are normal. Abdominal aorta shows minimal calcification with no distention.  Bladder is normal.  Reproductive organs are normal.  Stomach is decompressed which exaggerates gastric wall thickness. Allowing for this, gastric wall appears mildly thickened nonetheless. Small bowel is normal. Evaluation of the colon is difficult without IV contrast, with oral contrast not having reached the colon. The colon is not a dilated, but the does appear to be diffuse colonic wall thickening. Mild to moderate inflammatory change tracks along the entire colon.  There are no acute musculoskeletal abnormalities.  IMPRESSION: 1. Small volume of ascites. Mildly nodular liver contour. Findings suggest possibility of cirrhosis. 2. Stomach is decompressed but shows evidence of wall thickening. Possible gastritis. 3. Evidence of mild diffuse colon wall thickening with inflammatory change surrounding the entire colon. Diffuse colitis suspected.   Electronically Signed   By: Esperanza Heir M.D.   On: 01/04/2014 01:26   Ct Head Wo Contrast  01/04/2014   CLINICAL DATA:  Lethargy and altered mental status.  EXAM: CT HEAD WITHOUT CONTRAST  TECHNIQUE: Contiguous axial images were obtained from the base of the skull through the vertex without intravenous contrast.  COMPARISON:  CT of the head performed 10/23/2013  FINDINGS: There is no evidence of acute infarction, mass lesion, or intra- or extra-axial hemorrhage on CT.   Prominence of the ventricles and sulci suggests mild cortical volume loss. Scattered periventricular white matter change likely reflects small vessel ischemic microangiopathy.  The brainstem and fourth ventricle are within normal limits. The basal ganglia are unremarkable in appearance. The cerebral hemispheres demonstrate grossly normal gray-white differentiation. No mass effect or midline shift is seen.  There is no evidence of fracture; visualized osseous structures are unremarkable in appearance. There is apparent mild bilateral proptosis. The paranasal sinuses and mastoid air cells are well-aerated. No significant soft tissue abnormalities are seen.  IMPRESSION: 1. No acute intracranial pathology seen on CT. 2. Mild cortical volume loss and scattered small vessel ischemic microangiopathy. 3. Apparent mild bilateral proptosis  noted.   Electronically Signed   By: Roanna Raider M.D.   On: 01/04/2014 01:25   Medications: I have reviewed the patient's current medications. Scheduled Meds: . abacavir  300 mg Oral BID  . acetaminophen  650 mg Oral Once  . acyclovir  800 mg Oral Daily  . aspirin EC  81 mg Oral Daily  . Chlorhexidine Gluconate Cloth  6 each Topical Q0600  . cloNIDine  0.3 mg Oral TID  . Darunavir Ethanolate  800 mg Oral Q breakfast  . enoxaparin (LOVENOX) injection  30 mg Subcutaneous Q24H  . furosemide  40 mg Intravenous BID  . furosemide  40 mg Oral BID  . hydrALAZINE  50 mg Oral TID  . hydrocerin   Topical BID  . insulin aspart  0-9 Units Subcutaneous 6 times per day  . insulin glargine  20 Units Subcutaneous QHS  . labetalol  400 mg Oral BID  . lactulose  20 g Oral TID  . lamiVUDine  150 mg Oral Daily  . levETIRAcetam  1,000 mg Oral BID  . lisinopril  40 mg Oral Daily  . mupirocin ointment  1 application Nasal BID  . pantoprazole  40 mg Oral Daily  . ritonavir  100 mg Oral Q breakfast  . sodium bicarbonate  650 mg Oral BID  . sodium chloride  3 mL Intravenous Q12H    Continuous Infusions:  PRN Meds:.hydrOXYzine, menthol-cetylpyridinium, ondansetron (ZOFRAN) IV, ondansetron Assessment/Plan: Ms. 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   Hepatic Encephalopathy: Resolved. She arrived lethargic which was concerning for acute hepatic encephalopathy. Ammonia level on admission was 127 and increased to 161, but she is back to her baseline mental status today.  -Continue home lactulose PO PRN titrated to 3BM/day  -Diet heart healthy/ Carb mod only if fully alert   Ascites: Soft and obese abdomen, only mildly distended and not tense. CT abdomen/pelvis on presentation with only mild ascities, no acute intraabdominal process. She denies increased abdominal distention and does not appear volume overloaded. She does not complain of SOB or dyspnea with improvement of her altered mental status. Not concerned for SBP at this time.  -Will consider diagnostic/therapeutic tap of ascitic fluid if she declines   History of seizure: Patient takes Dilantin at home which can be sedating. This may have precipitated her somnolence, as her corrected level per pharmacy is 25. Pharmacy on board and recommend decreasing dilantin and getting repeat level after ~5 days. Also mention the possible need to adjust HIV medications due to concern for interaction.  -Discontinue Dilantin  -Dilantin level in AM  -Continue Keppra   Hypokalemia: Resolved. K is 3.5 today after gentle repletion of K. Was 3.0 on admission.   Hepatitis C: Genotype in December 2014. Genotype 1b, viral load of [id number], Log of 6.63.  -She will f/u with Dr. Drue Second in ID for possible treatment of Hep C   HIV disease: Well controlled with viral load undectable in 09/08/13. CD4 count in April at 280. Will recheck labs  as dilantin level has been super therapeutic and can interact with antiretrovirals  -Repeat CD4  -Recheck viral load  -Continue home abacavir, prezista, norvir, and epivir  -Continue acyclovir for prophylaxis   HTN: BP mildly elevated initially but improved after home antihypertensives  were given.  -continue home clonidine 0.3mg  BID, hydralazine 50mg  TID, labetalol 200mg  BID, lisinopril 40mg  daily.  -Lasix was held while she was NPO, will resume Lasix 40mg  BID today.   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 Secondary to Hepatitis C infection. 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 Platelets and INR stable.   DMII: Last Hg A1C 7.5% on 11/13/13. On Lantus 30 u qHS and Novolog 10-25 u actid at home.  -Lantus 20 units qHS  -Sensitive SSI   Chronic Anemia: Likely anemia of chronic disease. Her baseline Hg is ~8. Two weeks ago her hemoglobin was lower around 6.7; since then it has been steadily increasing. Today her HbG is 7.9.   CKD, stage 4: Stable. Cr today is 2.39, which seems to be her baseline. GFR is 25, also is close to baseline.   DVT prophylaxis: Lovenox [place]   Diet:  HH/Carb mod   Dispo: Disposition is deferred at this time, awaiting improvement of current medical problems. Anticipated discharge in approximately 1-2 day(s).   .Services Needed at time of discharge: Y = Yes, Blank = No PT:   OT:   RN:   Equipment:   Other:     LOS: 2 days   Ky Barban, MD 01/05/2014, 3:00 PM

## 2014-01-05 NOTE — Progress Notes (Addendum)
Ms. Halderman is doing well. She was sleeping but aroused easily to voice. She complains of her sore throat still. She has some abdominal pain.  Filed Vitals:   01/05/14 0436 01/05/14 1354 01/05/14 1559 01/05/14 2106  BP: 160/82 133/74  98/61  Pulse: 73 98  91  Temp: 98.6 F (37 C) 102.9 F (39.4 C) 98.9 F (37.2 C) 98.6 F (37 C)  TempSrc: Oral Oral  Oral  Resp: 20 20  20   Height:      Weight:      SpO2: 100% 97%  97%   General: NAD HEENT: superficial ulcer at back of throat Neck: no LAD Chest: CTAB Heart: RRR Abdomen: mildly tender to palpation diffusely but most in RUQ Neuro: awakens to voice, answers questions appropriately  CHEST 2 VIEW 01/05/2014:  COMPARISON: Chest x-[place] 12/14/2013.  FINDINGS: Linear opacity in the lower left lung is unchanged, compatible with an area of mild scarring. Right internal jugular single-lumen power porta cath with tip terminating in the distal superior vena cava is unchanged. Lung volumes are low. No consolidative airspace disease.  No pleural effusions. No pneumothorax. No pulmonary nodule or mass noted. Pulmonary vasculature and the cardiomediastinal silhouette are within normal limits.  IMPRESSION:  1. Low lung volumes without radiographic evidence of acute  cardiopulmonary disease.  2. Mild scarring in the inferior aspect of the left lung, similar  prior examinations.  UA awaiting collection. Magic mouthwash with lidocaine ordered for mouth sore Will continue to monitor for fever.  Patient seen and discussed with sr. resident, Dr. Danella Sensing, PGY1 Internal Medicine Teaching [facility]

## 2014-01-05 NOTE — Progress Notes (Signed)
  Date: 01/05/2014  Patient name: Michelle [name]  Medical record number: [id number]  Date of birth: Dec 15, 1960   This patient has been seen and the plan of care was discussed with the house staff. Please see their note for complete details. I concur with their findings.   Judyann Munson, MD 01/05/2014, 11:16 PM

## 2014-01-06 ENCOUNTER — Encounter (HOSPITAL_COMMUNITY): Payer: Self-pay | Admitting: Radiology

## 2014-01-06 DIAGNOSIS — R7881 Bacteremia: Secondary | ICD-10-CM | POA: Diagnosis present

## 2014-01-06 LAB — COMPREHENSIVE METABOLIC PANEL
ALBUMIN: 1.8 g/dL — AB (ref 3.5–5.2)
ALT: 48 U/L — ABNORMAL HIGH (ref 0–35)
AST: 54 U/L — AB (ref 0–37)
Alkaline Phosphatase: 407 U/L — ABNORMAL HIGH (ref 39–117)
Anion gap: 16 — ABNORMAL HIGH (ref 5–15)
BUN: 54 mg/dL — ABNORMAL HIGH (ref 6–23)
CALCIUM: 7.1 mg/dL — AB (ref 8.4–10.5)
CO2: 17 mEq/L — ABNORMAL LOW (ref 19–32)
Chloride: 106 mEq/L (ref 96–112)
Creatinine, Ser: 2.7 mg/dL — ABNORMAL HIGH (ref 0.50–1.10)
GFR calc Af Amer: 22 mL/min — ABNORMAL LOW (ref >90)
GFR calc non Af Amer: 19 mL/min — ABNORMAL LOW (ref >90)
Glucose, Bld: 124 mg/dL — ABNORMAL HIGH (ref 70–99)
Potassium: 3.6 mEq/L — ABNORMAL LOW (ref 3.7–5.3)
Sodium: 139 mEq/L (ref 137–147)
TOTAL PROTEIN: 5.2 g/dL — AB (ref 6.0–8.3)
Total Bilirubin: 4.5 mg/dL — ABNORMAL HIGH (ref 0.3–1.2)

## 2014-01-06 LAB — URINALYSIS, ROUTINE W REFLEX MICROSCOPIC
Glucose, UA: 100 mg/dL — AB
Hgb urine dipstick: NEGATIVE
Ketones, ur: NEGATIVE mg/dL
NITRITE: NEGATIVE
PH: 5.5 (ref 5.0–8.0)
Protein, ur: >300 mg/dL — AB
Specific Gravity, Urine: 1.021 (ref 1.005–1.030)
UROBILINOGEN UA: 1 mg/dL (ref 0.0–1.0)

## 2014-01-06 LAB — URINE MICROSCOPIC-ADD ON

## 2014-01-06 LAB — GLUCOSE, CAPILLARY
GLUCOSE-CAPILLARY: 188 mg/dL — AB (ref 70–99)
GLUCOSE-CAPILLARY: 240 mg/dL — AB (ref 70–99)
Glucose-Capillary: 140 mg/dL — ABNORMAL HIGH (ref 70–99)
Glucose-Capillary: 250 mg/dL — ABNORMAL HIGH (ref 70–99)
Glucose-Capillary: 270 mg/dL — ABNORMAL HIGH (ref 70–99)
Glucose-Capillary: 306 mg/dL — ABNORMAL HIGH (ref 70–99)

## 2014-01-06 LAB — CBC WITH DIFFERENTIAL/PLATELET
Basophils Absolute: 0 10*3/uL (ref 0.0–0.1)
Basophils Relative: 0 % (ref 0–1)
EOS PCT: 0 % (ref 0–5)
Eosinophils Absolute: 0 10*3/uL (ref 0.0–0.7)
HCT: 25.2 % — ABNORMAL LOW (ref 36.0–46.0)
Hemoglobin: 8.4 g/dL — ABNORMAL LOW (ref 12.0–15.0)
LYMPHS ABS: 0.8 10*3/uL (ref 0.7–4.0)
Lymphocytes Relative: 7 % — ABNORMAL LOW (ref 12–46)
MCH: 35.7 pg — ABNORMAL HIGH (ref 26.0–34.0)
MCHC: 33.3 g/dL (ref 30.0–36.0)
MCV: 107.2 fL — AB (ref 78.0–100.0)
MONOS PCT: 9 % (ref 3–12)
Monocytes Absolute: 1 10*3/uL (ref 0.1–1.0)
Neutro Abs: 9.6 10*3/uL — ABNORMAL HIGH (ref 1.7–7.7)
Neutrophils Relative %: 84 % — ABNORMAL HIGH (ref 43–77)
Platelets: 148 10*3/uL — ABNORMAL LOW (ref 150–400)
RBC: 2.35 MIL/uL — AB (ref 3.87–5.11)
RDW: 14.3 % (ref 11.5–15.5)
WBC: 11.4 10*3/uL — ABNORMAL HIGH (ref 4.0–10.5)

## 2014-01-06 LAB — LACTATE DEHYDROGENASE: LDH: 345 U/L — ABNORMAL HIGH (ref 94–250)

## 2014-01-06 LAB — BILIRUBIN, DIRECT: Bilirubin, Direct: 4.8 mg/dL — ABNORMAL HIGH (ref 0.0–0.3)

## 2014-01-06 LAB — HAPTOGLOBIN: Haptoglobin: <25 mg/dL — ABNORMAL LOW (ref 45–215)

## 2014-01-06 MED ORDER — LEVETIRACETAM 500 MG PO TABS
500.0000 mg | ORAL_TABLET | Freq: Two times a day (BID) | ORAL | Status: DC
  Administered 2014-01-06: 500 mg via ORAL
  Filled 2014-01-06 (×3): qty 1

## 2014-01-06 MED ORDER — CLONIDINE HCL 0.3 MG PO TABS
0.3000 mg | ORAL_TABLET | Freq: Once | ORAL | Status: DC
  Filled 2014-01-06: qty 1

## 2014-01-06 MED ORDER — VANCOMYCIN HCL 10 G IV SOLR
1250.0000 mg | Freq: Once | INTRAVENOUS | Status: AC
  Administered 2014-01-06: 1250 mg via INTRAVENOUS
  Filled 2014-01-06: qty 1250

## 2014-01-06 MED ORDER — VANCOMYCIN HCL IN DEXTROSE 750-5 MG/150ML-% IV SOLN
750.0000 mg | INTRAVENOUS | Status: DC
  Administered 2014-01-07: 750 mg via INTRAVENOUS
  Filled 2014-01-06: qty 150

## 2014-01-06 MED ORDER — ENOXAPARIN SODIUM 30 MG/0.3ML ~~LOC~~ SOLN
30.0000 mg | SUBCUTANEOUS | Status: DC
  Administered 2014-01-09 – 2014-01-15 (×7): 30 mg via SUBCUTANEOUS
  Filled 2014-01-06 (×8): qty 0.3

## 2014-01-06 MED ORDER — LAMIVUDINE 10 MG/ML PO SOLN
100.0000 mg | Freq: Every day | ORAL | Status: DC
  Administered 2014-01-06 – 2014-01-07 (×2): 100 mg via ORAL
  Filled 2014-01-06 (×2): qty 10

## 2014-01-06 MED ORDER — LACOSAMIDE 50 MG PO TABS
100.0000 mg | ORAL_TABLET | Freq: Two times a day (BID) | ORAL | Status: DC
  Administered 2014-01-07 – 2014-01-09 (×6): 100 mg via ORAL
  Filled 2014-01-06 (×7): qty 2

## 2014-01-06 MED ORDER — CLONIDINE HCL 0.3 MG PO TABS
0.3000 mg | ORAL_TABLET | Freq: Three times a day (TID) | ORAL | Status: DC
  Filled 2014-01-06 (×2): qty 1

## 2014-01-06 MED ORDER — CEFAZOLIN SODIUM-DEXTROSE 2-3 GM-% IV SOLR
2.0000 g | Freq: Two times a day (BID) | INTRAVENOUS | Status: DC
  Administered 2014-01-06 – 2014-01-13 (×13): 2 g via INTRAVENOUS
  Filled 2014-01-06 (×17): qty 50

## 2014-01-06 NOTE — Progress Notes (Signed)
CRITICAL VALUE ALERT  Critical value received:  Blood cultures 2 aerobic bottles and 1 anaerobic bottle both gram + cocci in clusters  Date of notification:  01/06/14  Time of notification:  06:47  Critical value read back: yes  Nurse who received alert:  Liviana Mills, Joan Mayans  MD notified (1st page):  Dr. Isabella Bowens  Time of first page:  06:48  MD notified (2nd page):  Time of second page:  Responding MD:  Dr. Isabella Bowens   Time MD responded:  [phone]

## 2014-01-06 NOTE — Progress Notes (Signed)
Subjective: She had fever again overnight at 101.48F. Blood cultures from 8/9 drawn at ~3:30 PM returned positive for Gram positive cocci in cluster, 2/2 vial. She was started on Vancomycin IV.   She complained of having chills this morning but denied abdominal pain, N/V, or diarrhea. Her mental status was at baseline.   Objective: Vital signs in last 24 hours: Filed Vitals:   01/06/14 1600 01/06/14 1621 01/06/14 1634 01/06/14 1700  BP: 147/75 147/75  98/51  Pulse: 97  93   Temp:   101 F (38.3 C)   TempSrc:   Oral   Resp: 20     Height:      Weight:      SpO2: 97% 9% 92%    Weight change:   Intake/Output Summary (Last 24 hours) at 01/06/14 1728 Last data filed at 01/06/14 1717  Gross per 24 hour  Intake    340 ml  Output      0 ml  Net    340 ml  Vital signs reviewed Gen: Lying in bed in NAD, following commands appropriately  HEENT: no scleral icterus, mild TTP of bilateral anterior cervical nodes but no lymphadenopathy, oropharynx with increased erythema but no tonsil exudates.  CV: RRR, no murmurs, rubs, or gallops. Normal S1 and S2. No appreciable JVD.  Resp: CTAB, normal respiratory effort  Abd: mildly distended, soft, bowel sounds present, no caput medusa  Ext: no peripheral edema, dry skin, no teleangiectasia. No splinter hemorrhage Neuro: Alert and oriented x3, no slurred speech, CN II-XII intact, no asterixis  Lab Results: Basic Metabolic Panel:  Recent Labs Lab 01/05/14 0530 01/06/14 0805  NA 144 139  K 3.5* 3.6*  CL 112 106  CO2 18* 17*  GLUCOSE 102* 124*  BUN 53* 54*  CREATININE 2.39* 2.70*  CALCIUM 7.1* 7.1*   Liver Function Tests:  Recent Labs Lab 01/05/14 0530 01/06/14 0805  AST 93* 54*  ALT 56* 48*  ALKPHOS 485* 407*  BILITOT 1.9* 4.5*  PROT 5.0* 5.2*  ALBUMIN 1.9* 1.8*    Recent Labs Lab 01/03/14 2115  LIPASE 116*    Recent Labs Lab 01/04/14 0100 01/04/14 0730  AMMONIA 127* 161*   CBC:  Recent Labs Lab 01/05/14 0530  01/06/14 0805  WBC 3.3* 11.4*  NEUTROABS  --  9.6*  HGB 7.9* 8.4*  HCT 24.5* 25.2*  MCV 108.4* 107.2*  PLT 173 148*   CBG:  Recent Labs Lab 01/05/14 2359 01/06/14 0009 01/06/14 0407 01/06/14 0805 01/06/14 1145 01/06/14 1630  GLUCAP 297* 250* 188* 140* 306* 240*   Coagulation:  Recent Labs Lab 01/04/14 0730  LABPROT 12.4  INR 0.92   Anemia Panel:  Recent Labs Lab 01/04/14 0730  VITAMINB12 564  FERRITIN 611*  TIBC 180*  IRON 90   Urine Drug Screen: Drugs of Abuse     Component Value Date/Time   LABOPIA NONE DETECTED 01/03/2014 2311   LABOPIA NEG 11/13/2013 1516   COCAINSCRNUR NONE DETECTED 01/03/2014 2311   COCAINSCRNUR NEG 11/13/2013 1516   LABBENZ NONE DETECTED 01/03/2014 2311   LABBENZ NEG 11/13/2013 1516   AMPHETMU NONE DETECTED 01/03/2014 2311   THCU NONE DETECTED 01/03/2014 2311   LABBARB NONE DETECTED 01/03/2014 2311   LABBARB NEG 11/13/2013 1516    Urinalysis:  Recent Labs Lab 01/03/14 2311 01/06/14 1141  COLORURINE YELLOW ORANGE*  LABSPEC 1.010 1.021  PHURINE 6.0 5.5  GLUCOSEU 100* 100*  HGBUR NEGATIVE NEGATIVE  BILIRUBINUR NEGATIVE LARGE*  KETONESUR NEGATIVE NEGATIVE  PROTEINUR >300* >300*  UROBILINOGEN 1.0 1.0  NITRITE NEGATIVE NEGATIVE  LEUKOCYTESUR NEGATIVE TRACE*     Micro Results: Recent Results (from the past 240 hour(s))  MRSA PCR SCREENING     Status: Abnormal   Collection Time    01/04/14  4:35 AM      Result Value Ref Range Status   MRSA by PCR POSITIVE (*) NEGATIVE Final   Comment:            The GeneXpert MRSA Assay (FDA     approved for NASAL specimens     only), is one component of a     comprehensive MRSA colonization     surveillance program. It is not     intended to diagnose MRSA     infection nor to guide or     monitor treatment for     MRSA infections.     RESULT CALLED TO, READ BACK BY AND VERIFIED WITH:     R.ZELLNER,RN [id number] 01/04/14 M.CAMPBELL  CULTURE, BLOOD (ROUTINE X 2)     Status: None   Collection Time      01/05/14  3:30 PM      Result Value Ref Range Status   Specimen Description BLOOD LEFT ARM   Final   Special Requests     Final   Value: BOTTLES DRAWN AEROBIC AND ANAEROBIC 10CC BLUE, 5CC RED   Culture  Setup Time     Final   Value: 01/05/2014 22:48     Performed at [facility]   Culture     Final   Value: GRAM POSITIVE COCCI IN CLUSTERS     Note: Gram Stain Report Called to,Read Back By and Verified With: Volanda NapoleonNikki Faucette RN on 01/06/14 at 06:45 by Christie NottinghamAnne Skeen     Performed at [facility]   Report Status PENDING   Incomplete  CULTURE, BLOOD (ROUTINE X 2)     Status: None   Collection Time    01/05/14  3:42 PM      Result Value Ref Range Status   Specimen Description BLOOD BLOOD LEFT FOREARM   Final   Special Requests BOTTLES DRAWN AEROBIC ONLY 5CC   Final   Culture  Setup Time     Final   Value: 01/05/2014 22:48     Performed at [facility]   Culture     Final   Value: GRAM POSITIVE COCCI IN CLUSTERS     Note: Gram Stain Report Called to,Read Back By and Verified With: Volanda NapoleonNikki Faucette RN on 01/06/14 at 06:45 by Christie NottinghamAnne Skeen     Performed at [facility]   Report Status PENDING   Incomplete   Studies/Results: Dg Chest 2 View  01/05/2014   CLINICAL DATA:  Fever, cough, chest pain and shortness of breath.  EXAM: CHEST  2 VIEW  COMPARISON:  Chest x-Siegfried 12/14/2013.  FINDINGS: Linear opacity in the lower left lung is unchanged, compatible with an area of mild scarring. Right internal jugular single-lumen power porta cath with tip terminating in the distal superior vena cava is unchanged. Lung volumes are low. No consolidative airspace disease. No pleural effusions. No pneumothorax. No pulmonary nodule or mass noted. Pulmonary vasculature and the cardiomediastinal silhouette are within normal limits.  IMPRESSION: 1. Low lung volumes without radiographic evidence of acute cardiopulmonary disease. 2. Mild scarring in the inferior aspect of the left lung, similar  prior examinations.   Electronically Signed   By: Brayton Marsaniel  Entrikin M.D.  On: 01/05/2014 19:15   Medications: I have reviewed the patient's current medications. Scheduled Meds: . abacavir  300 mg Oral BID  . acyclovir  800 mg Oral Daily  . aspirin EC  81 mg Oral Daily  .  ceFAZolin (ANCEF) IV  2 g Intravenous Q12H  . Chlorhexidine Gluconate Cloth  6 each Topical Q0600  . cloNIDine  0.3 mg Oral TID  . cloNIDine  0.3 mg Oral Once  . Darunavir Ethanolate  800 mg Oral Q breakfast  . [START ON 01/08/2014] enoxaparin (LOVENOX) injection  30 mg Subcutaneous Q24H  . furosemide  40 mg Intravenous BID  . furosemide  40 mg Oral BID  . hydrALAZINE  50 mg Oral TID  . hydrocerin   Topical BID  . insulin aspart  0-9 Units Subcutaneous 6 times per day  . insulin glargine  20 Units Subcutaneous QHS  . labetalol  400 mg Oral BID  . lacosamide  100 mg Oral BID  . lactulose  20 g Oral TID  . lamiVUDine  100 mg Oral Daily  . levETIRAcetam  1,000 mg Oral BID  . lisinopril  40 mg Oral Daily  . magic mouthwash w/lidocaine  5 mL Oral TID  . mupirocin ointment  1 application Nasal BID  . pantoprazole  40 mg Oral Daily  . ritonavir  100 mg Oral Q breakfast  . sodium bicarbonate  650 mg Oral BID  . sodium chloride  3 mL Intravenous Q12H  . [START ON 01/07/2014] vancomycin  750 mg Intravenous Q24H   Continuous Infusions:  PRN Meds:.hydrOXYzine, menthol-cetylpyridinium, ondansetron (ZOFRAN) IV, ondansetron Assessment/Plan: Ms. 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 She also has Her fever curve trended down but she  spike fevers again today. BC from 8/9 grew Gram positive cocci in cluster in 2/2 vials in less than 24 hours which is concerning for Staph (including MRSA) bacteremia. She was started on Vancomycin treatment on 8/10 morning. The source of her bacteremia is unclear at this time but her port-a-cath placed on 12/05/13 is a possible and likely source. SBP less likely given minimum ascites, no abdominal pain, no changes in mental status. No signs of endocarditis on exam with no new murmur, no splinter hemorrhage.  -Transfer patient to SDU -Continue vancomycin per pharmacy -Add cefazolin -Port-a-cath removal within 24 hours--to be NPO and Lovenox held for this. Appreciate help from IR -TEE on 8/12 -Continue monitoring for s/s of SBP -follow up on BC speciation--should be back tomorrow per micro bench report -Hold antihypertensives if her BP decompensates  Hepatic Encephalopathy: Resolved. She arrived lethargic which was concerning for acute hepatic encephalopathy. Ammonia level on admission was 127 and increased to 161, but she is back to her baseline mental status today.  -Continue home lactulose PO PRN titrated to 3BM/day  -Diet heart healthy/ Carb mod only if fully alert   Ascites: Soft and obese abdomen, only mildly distended and not tense. CT abdomen/pelvis on presentation with only mild ascities, no acute intraabdominal process. She denies increased abdominal distention and does not appear volume overloaded. She  does not complain of SOB or dyspnea with improvement of her altered mental status. Not concerned for SBP at this time. Bilirubin 4.8 today. -Will consider diagnostic/therapeutic tap of ascitic fluid if she declines  -CMP in am  CKD, stage 4: Stable. Cr bumped up to 2.7 today. Will hold Lasix.  -Continue ACEi but may hold if Cr continues to rise.  -CMP in am   History of seizure: Patient takes Dilantin at home which can be sedating. This may have precipitated her somnolence, as her  corrected level per pharmacy is 25. Pharmacy on board and recommend decreasing dilantin and getting repeat level after ~5 days. Also mention the possible need to adjust HIV medications due to concern for interaction.  -Discontinue Dilantin  -Start vimpat -Continue Keppra   Hypokalemia: Improved. K is 3.6 today. Will continue monitoring and provide gentle repletion given her CKD4 if K is at or <3.0.    Hepatitis C: Genotype in December 2014. Genotype 1b, viral load of [id number], Log of 6.63.  -She will f/u with Dr. Drue Second in ID for possible treatment of Hep C   HIV disease: Well controlled with viral load undectable in 09/08/13. CD4 count in April at 280. Will recheck labs as dilantin level has been super therapeutic and can interact with antiretrovirals  -Repeat CD4  -Recheck viral load  -Continue home abacavir, prezista, norvir, and epivir  -Continue acyclovir for prophylaxis   HTN: BP mildly elevated initially but improved after home antihypertensives were given.  -continue home clonidine 0.3mg  BID, hydralazine 50mg  TID, labetalol 200mg  BID, lisinopril 40mg  daily.  -Will hold Lasix due to bump in creatinine and bacteremia.     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 Secondary to Hepatitis C infection. 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 Platelets and INR stable.   DMII: Last Hg A1C 7.5% on 11/13/13. On Lantus 30 u qHS and Novolog 10-25 u actid at home.  -Lantus 20 units qHS  -Sensitive SSI   Chronic Anemia: Likely anemia of chronic disease. Her baseline Hg is ~8. Two weeks ago her hemoglobin was lower around 6.7; since then it has been steadily increasing. Today her HbG is 7.9.    DVT prophylaxis: SCDs  Diet:  NPO after midnight for port-a-cath removal, then HH/Carb mod, then NPO after mdnt on 8/11 for TEE on 8/12  Dispo: Disposition is deferred at this time, awaiting improvement of current medical problems. Anticipated discharge in approximately 2-3 day(s).    The patient  does have a current PCP Christen Bame, MD) and does need an [facility] hospital follow-up appointment after discharge.  The patient does have transportation limitations that hinder transportation to clinic appointments.  .Services Needed at time of discharge: Y = Yes, Blank = No PT:   OT:   RN:   Equipment:   Other:     LOS: 3 days   Ky Barban, MD 01/06/2014, 5:28 PM

## 2014-01-06 NOTE — Progress Notes (Signed)
MD notified that patient's SBP was 193 and temp was 101.4.  She ordered blood cultures and ice packs to be placed under patient's underarms, as well as, hypertension meds.  She requested a temp re-eval in one to two hours.  Will continue to monitor patient.

## 2014-01-06 NOTE — H&P (Signed)
Referring Physician: Dr. Baxter Flattery HPI: Michelle [name] is an 53 y.o. female with HIV who had a port placed 12/05/13. She presents with SIRS secondary to gram positive cocci bacteremia. ID is following and requested port a catheter removal. The patient is confused and consent is obtained by her sister. The patient denies any chest pain. She denies any known problems with her port a catheter or previous infections. Her sister denies any known complications to sedation in the past. The patient did receive lovenox today and will have her port removed tomorrow after lovenox is held.   Past Medical History:  Past Medical History  Diagnosis Date  . Stroke   . Meningitis   . HIV (human immunodeficiency virus infection) 1981  . Hypertension   . Gout   . Muscle spasms of head and/or neck   . CHF (congestive heart failure)     [name] 06/18/2013  . HCV (hepatitis C virus)     chronic/notes 06/18/2013  . Type II diabetes mellitus     [name] 06/18/2013  . AIHA (autoimmune hemolytic anemia)     [name] 06/18/2013  . Hypertensive encephalopathy ~ 05/2013    hospitalaized/notes 06/18/2013  . Exertional shortness of breath     [name] 06/18/2013  . Anxiety     [name] 06/18/2013  . History of syphilis   . High cholesterol   . CKD (chronic kidney disease), stage III   . Nephrotic syndrome   . Cirrhosis     hepatitis C   . GERD (gastroesophageal reflux disease)   . Migraine     "all the time" (11/27/2013)  . Seizures     "last sz was week before last" (11/27/2013)  . Psychosis   . Chronic venous stasis dermatitis of both lower extremities     Past Surgical History:  Past Surgical History  Procedure Laterality Date  . Hip pinning Right 1980's  . Av fistula placement Right 07/24/2013    Procedure: RIGHT arm exploration of antecubital space;  Surgeon: Elam Dutch, MD;  Location: [facility];  Service: Vascular;  Laterality: Right;  . Portacath placement  11/27/2013  . Colonoscopy N/A 12/19/2013    Procedure:  COLONOSCOPY;  Surgeon: Gatha Mayer, MD;  Location: [facility];  Service: Endoscopy;  Laterality: N/A;  . Esophagogastroduodenoscopy N/A 12/19/2013    Procedure: ESOPHAGOGASTRODUODENOSCOPY (EGD);  Surgeon: Gatha Mayer, MD;  Location: [facility] ENDOSCOPY;  Service: Endoscopy;  Laterality: N/A;    Family History:  Family History  Problem Relation Age of Onset  . Cancer - Colon Mother   . Cancer Father   . Hypertension Father   . Diabetes    . Diabetes Sister     Social History:  reports that she quit smoking about 3 years ago. Her smoking use included Cigarettes. She smoked 0.00 packs per day. She has never used smokeless tobacco. She reports that she uses illicit drugs (Cocaine, Marijuana, and "Crack" cocaine). She reports that she does not drink alcohol.  Allergies:  Allergies  Allergen Reactions  . 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 Morphine And Related Hives, Itching and Rash  . Norvasc [Amlodipine Besylate] Hives, Itching and Rash    Medications:   Medication List    ASK your doctor about these medications       abacavir 300 MG tablet  Commonly known as:  ZIAGEN  Take 600 mg by mouth daily.     acyclovir 800  MG tablet  Commonly known as:  ZOVIRAX  Take 800 mg by mouth daily.     aspirin EC 81 MG tablet  Take 81 mg by mouth daily.     cloNIDine 0.3 MG tablet  Commonly known as:  CATAPRES  Take 0.3 mg by mouth 3 (three) times daily.     DILANTIN 100 MG ER capsule  Generic drug:  phenytoin  Take 300-400 mg by mouth 2 (two) times daily. 3 ($RemoveB'300mg'kueOgUUX$ ) tablets in the morning and 4 ($Remo'400mg'venDh$ ) tablets in the evening     furosemide 80 MG tablet  Commonly known as:  LASIX  Take 80 mg by mouth 2 (two) times daily.     hydrALAZINE 50 MG tablet  Commonly known as:  APRESOLINE  Take 100 mg by mouth 3 (three) times daily.     insulin aspart 100 UNIT/ML injection  Commonly known as:  novoLOG  Inject 10-25  Units into the skin 3 (three) times daily before meals. Based on sliding scale     insulin glargine 100 UNIT/ML injection  Commonly known as:  LANTUS  Inject 50 Units into the skin at bedtime.     labetalol 300 MG tablet  Commonly known as:  NORMODYNE  Take 600 mg by mouth 2 (two) times daily.     lamiVUDine 150 MG tablet  Commonly known as:  EPIVIR  Take 150 mg by mouth daily.     levETIRAcetam 500 MG tablet  Commonly known as:  KEPPRA  Take 1,500 mg by mouth 2 (two) times daily.     lisinopril 40 MG tablet  Commonly known as:  PRINIVIL,ZESTRIL  Take 40 mg by mouth daily.     pantoprazole 40 MG tablet  Commonly known as:  PROTONIX  Take 1 tablet (40 mg total) by mouth daily.     potassium chloride 10 MEQ tablet  Commonly known as:  K-DUR,KLOR-CON  Take 1 tablet (10 mEq total) by mouth daily.     PREZISTA 800 MG tablet  Generic drug:  Darunavir Ethanolate  Take 800 mg by mouth daily with breakfast.     ritonavir 100 MG capsule  Commonly known as:  NORVIR  Take 100 mg by mouth daily with breakfast.     sodium bicarbonate 650 MG tablet  Take 1 tablet (650 mg total) by mouth 2 (two) times daily.       Please HPI for pertinent positives, otherwise complete 10 system ROS negative.  Physical Exam: BP 147/75  Pulse 97  Temp(Src) 101.3 F (38.5 C) (Oral)  Resp 20  Ht $R'5\' 5"'Jy$  (1.651 m)  Wt 172 lb 6.4 oz (78.2 kg)  BMI 28.69 kg/m2  SpO2 9% Body mass index is 28.69 kg/(m^2).  General Appearance:  Alert, confused.  Head:  Normocephalic, without obvious abnormality, atraumatic  Neck: Supple, symmetrical, trachea midline  Lungs:   Clear to auscultation bilaterally, no w/r/r, respirations unlabored without use of accessory muscles.  Chest Wall:  No tenderness or deformity, right port site with erythema and dry purulent material.   Heart:  Regular rate and rhythm, S1, S2 normal, no murmur, rub or gallop.  Abdomen:   Soft, non-tender, non distended.  Extremities:  Extremities normal, atraumatic, no cyanosis or edema   Results for orders placed during the hospital encounter of 01/03/14 (from the past 48 hour(s))  GLUCOSE, CAPILLARY     Status: Abnormal   Collection Time    01/04/14  8:04 PM      Result Value Ref Range  Glucose-Capillary 330 (*) 70 - 99 mg/dL  GLUCOSE, CAPILLARY     Status: Abnormal   Collection Time    01/04/14 10:33 PM      Result Value Ref Range   Glucose-Capillary 178 (*) 70 - 99 mg/dL  GLUCOSE, CAPILLARY     Status: Abnormal   Collection Time    01/05/14 12:31 AM      Result Value Ref Range   Glucose-Capillary 135 (*) 70 - 99 mg/dL   Comment 1 Notify RN     Comment 2 Documented in Chart    GLUCOSE, CAPILLARY     Status: None   Collection Time    01/05/14  4:33 AM      Result Value Ref Range   Glucose-Capillary 97  70 - 99 mg/dL  PHENYTOIN LEVEL, TOTAL     Status: None   Collection Time    01/05/14  5:30 AM      Result Value Ref Range   Phenytoin Lvl 12.2  10.0 - 20.0 ug/mL  COMPREHENSIVE METABOLIC PANEL     Status: Abnormal   Collection Time    01/05/14  5:30 AM      Result Value Ref Range   Sodium 144  137 - 147 mEq/L   Potassium 3.5 (*) 3.7 - 5.3 mEq/L   Chloride 112  96 - 112 mEq/L   CO2 18 (*) 19 - 32 mEq/L   Glucose, Bld 102 (*) 70 - 99 mg/dL   BUN 53 (*) 6 - 23 mg/dL   Creatinine, Ser 2.39 (*) 0.50 - 1.10 mg/dL   Calcium 7.1 (*) 8.4 - 10.5 mg/dL   Total Protein 5.0 (*) 6.0 - 8.3 g/dL   Albumin 1.9 (*) 3.5 - 5.2 g/dL   AST 93 (*) 0 - 37 U/L   ALT 56 (*) 0 - 35 U/L   Alkaline Phosphatase 485 (*) 39 - 117 U/L   Total Bilirubin 1.9 (*) 0.3 - 1.2 mg/dL   GFR calc non Af Amer 22 (*) >90 mL/min   GFR calc Af Amer 25 (*) >90 mL/min   Comment: (NOTE)     The eGFR has been calculated using the CKD EPI equation.     This calculation has not been validated in all clinical situations.     eGFR's persistently <90 mL/min signify possible Chronic Kidney     Disease.   Anion gap 14  5 - 15  CBC     Status:  Abnormal   Collection Time    01/05/14  5:30 AM      Result Value Ref Range   WBC 3.3 (*) 4.0 - 10.5 K/uL   RBC 2.26 (*) 3.87 - 5.11 MIL/uL   Hemoglobin 7.9 (*) 12.0 - 15.0 g/dL   HCT 24.5 (*) 36.0 - 46.0 %   MCV 108.4 (*) 78.0 - 100.0 fL   MCH 35.0 (*) 26.0 - 34.0 pg   MCHC 32.2  30.0 - 36.0 g/dL   RDW 14.4  11.5 - 15.5 %   Platelets 173  150 - 400 K/uL  GLUCOSE, CAPILLARY     Status: Abnormal   Collection Time    01/05/14  7:37 AM      Result Value Ref Range   Glucose-Capillary 146 (*) 70 - 99 mg/dL  GLUCOSE, CAPILLARY     Status: Abnormal   Collection Time    01/05/14 11:35 AM      Result Value Ref Range   Glucose-Capillary 264 (*) 70 - 99  mg/dL  CULTURE, BLOOD (ROUTINE X 2)     Status: None   Collection Time    01/05/14  3:30 PM      Result Value Ref Range   Specimen Description BLOOD LEFT ARM     Special Requests       Value: BOTTLES DRAWN AEROBIC AND ANAEROBIC 10CC BLUE, 5CC RED   Culture  Setup Time       Value: 01/05/2014 22:48     Performed at [facility]   Culture       Value: [place] IN CLUSTERS     Note: Gram Stain Report Called to,Read Back By and Verified With: Ventura Bruns RN on 01/06/14 at 06:45 by Rise Mu     Performed at [facility]   Report Status PENDING    CULTURE, BLOOD (ROUTINE X 2)     Status: None   Collection Time    01/05/14  3:42 PM      Result Value Ref Range   Specimen Description BLOOD BLOOD LEFT FOREARM     Special Requests BOTTLES DRAWN AEROBIC ONLY 5CC     Culture  Setup Time       Value: 01/05/2014 22:48     Performed at [facility]   Culture       Value: [place] IN CLUSTERS     Note: Gram Stain Report Called to,Read Back By and Verified With: Ventura Bruns RN on 01/06/14 at 06:45 by Rise Mu     Performed at [facility]   Report Status PENDING    GLUCOSE, CAPILLARY     Status: Abnormal   Collection Time    01/05/14  4:30 PM      Result Value Ref Range    Glucose-Capillary 186 (*) 70 - 99 mg/dL  GLUCOSE, CAPILLARY     Status: Abnormal   Collection Time    01/05/14  7:47 PM      Result Value Ref Range   Glucose-Capillary 266 (*) 70 - 99 mg/dL   Comment 1 Documented in Chart     Comment 2 Notify RN    GLUCOSE, CAPILLARY     Status: Abnormal   Collection Time    01/05/14 11:59 PM      Result Value Ref Range   Glucose-Capillary 297 (*) 70 - 99 mg/dL   Comment 1 Documented in Chart     Comment 2 Notify RN    GLUCOSE, CAPILLARY     Status: Abnormal   Collection Time    01/06/14 12:09 AM      Result Value Ref Range   Glucose-Capillary 250 (*) 70 - 99 mg/dL  GLUCOSE, CAPILLARY     Status: Abnormal   Collection Time    01/06/14  4:07 AM      Result Value Ref Range   Glucose-Capillary 188 (*) 70 - 99 mg/dL  COMPREHENSIVE METABOLIC PANEL     Status: Abnormal   Collection Time    01/06/14  8:05 AM      Result Value Ref Range   Sodium 139  137 - 147 mEq/L   Potassium 3.6 (*) 3.7 - 5.3 mEq/L   Chloride 106  96 - 112 mEq/L   CO2 17 (*) 19 - 32 mEq/L   Glucose, Bld 124 (*) 70 - 99 mg/dL   BUN 54 (*) 6 - 23 mg/dL   Creatinine, Ser 2.70 (*) 0.50 - 1.10 mg/dL   Calcium 7.1 (*) 8.4 -  10.5 mg/dL   Total Protein 5.2 (*) 6.0 - 8.3 g/dL   Albumin 1.8 (*) 3.5 - 5.2 g/dL   AST 54 (*) 0 - 37 U/L   ALT 48 (*) 0 - 35 U/L   Alkaline Phosphatase 407 (*) 39 - 117 U/L   Total Bilirubin 4.5 (*) 0.3 - 1.2 mg/dL   GFR calc non Af Amer 19 (*) >90 mL/min   GFR calc Af Amer 22 (*) >90 mL/min   Comment: (NOTE)     The eGFR has been calculated using the CKD EPI equation.     This calculation has not been validated in all clinical situations.     eGFR's persistently <90 mL/min signify possible Chronic Kidney     Disease.   Anion gap 16 (*) 5 - 15  CBC WITH DIFFERENTIAL     Status: Abnormal   Collection Time    01/06/14  8:05 AM      Result Value Ref Range   WBC 11.4 (*) 4.0 - 10.5 K/uL   RBC 2.35 (*) 3.87 - 5.11 MIL/uL   Hemoglobin 8.4 (*) 12.0 - 15.0  g/dL   HCT 05.3 (*) 97.6 - 73.4 %   MCV 107.2 (*) 78.0 - 100.0 fL   MCH 35.7 (*) 26.0 - 34.0 pg   MCHC 33.3  30.0 - 36.0 g/dL   RDW 19.3  79.0 - 24.0 %   Platelets 148 (*) 150 - 400 K/uL   Neutrophils Relative % 84 (*) 43 - 77 %   Lymphocytes Relative 7 (*) 12 - 46 %   Monocytes Relative 9  3 - 12 %   Eosinophils Relative 0  0 - 5 %   Basophils Relative 0  0 - 1 %   Neutro Abs 9.6 (*) 1.7 - 7.7 K/uL   Lymphs Abs 0.8  0.7 - 4.0 K/uL   Monocytes Absolute 1.0  0.1 - 1.0 K/uL   Eosinophils Absolute 0.0  0.0 - 0.7 K/uL   Basophils Absolute 0.0  0.0 - 0.1 K/uL   Smear Review MORPHOLOGY UNREMARKABLE    GLUCOSE, CAPILLARY     Status: Abnormal   Collection Time    01/06/14  8:05 AM      Result Value Ref Range   Glucose-Capillary 140 (*) 70 - 99 mg/dL  URINALYSIS, ROUTINE W REFLEX MICROSCOPIC     Status: Abnormal   Collection Time    01/06/14 11:41 AM      Result Value Ref Range   Color, Urine ORANGE (*) YELLOW   Comment: BIOCHEMICALS MAY BE AFFECTED BY COLOR   APPearance CLOUDY (*) CLEAR   Specific Gravity, Urine 1.021  1.005 - 1.030   pH 5.5  5.0 - 8.0   Glucose, UA 100 (*) NEGATIVE mg/dL   Hgb urine dipstick NEGATIVE  NEGATIVE   Bilirubin Urine LARGE (*) NEGATIVE   Ketones, ur NEGATIVE  NEGATIVE mg/dL   Protein, ur >973 (*) NEGATIVE mg/dL   Urobilinogen, UA 1.0  0.0 - 1.0 mg/dL   Nitrite NEGATIVE  NEGATIVE   Leukocytes, UA TRACE (*) NEGATIVE  URINE MICROSCOPIC-ADD ON     Status: Abnormal   Collection Time    01/06/14 11:41 AM      Result Value Ref Range   Squamous Epithelial / LPF RARE  RARE   WBC, UA 0-2  <3 WBC/hpf   Bacteria, UA RARE  RARE   Casts GRANULAR CAST (*) NEGATIVE   Urine-Other RARE YEAST    GLUCOSE, CAPILLARY  Status: Abnormal   Collection Time    01/06/14 11:45 AM      Result Value Ref Range   Glucose-Capillary 306 (*) 70 - 99 mg/dL  BILIRUBIN, DIRECT     Status: Abnormal   Collection Time    01/06/14  2:59 PM      Result Value Ref Range    Bilirubin, Direct 4.8 (*) 0.0 - 0.3 mg/dL  LACTATE DEHYDROGENASE     Status: Abnormal   Collection Time    01/06/14  2:59 PM      Result Value Ref Range   LDH 345 (*) 94 - 250 U/L   Dg Chest 2 View  01/05/2014   CLINICAL DATA:  Fever, cough, chest pain and shortness of breath.  EXAM: CHEST  2 VIEW  COMPARISON:  Chest x-[facility] 12/14/2013.  FINDINGS: Linear opacity in the lower left lung is unchanged, compatible with an area of mild scarring. Right internal jugular single-lumen power porta cath with tip terminating in the distal superior vena cava is unchanged. Lung volumes are low. No consolidative airspace disease. No pleural effusions. No pneumothorax. No pulmonary nodule or mass noted. Pulmonary vasculature and the cardiomediastinal silhouette are within normal limits.  IMPRESSION: 1. Low lung volumes without radiographic evidence of acute cardiopulmonary disease. 2. Mild scarring in the inferior aspect of the left lung, similar prior examinations.   Electronically Signed   By: Vinnie Langton M.D.   On: 01/05/2014 19:15    Assessment/Plan HIV Right IJ port a catheter placed 12/05/13 Gram positive cocci bacteremia Request for port a catheter removal Patient received lovenox today, will hold for 8/11 and keep NPO after midnight.  Labs reviewed. Risks and Benefits discussed with the patient's sister. All questions were answered, patient's sister is agreeable to proceed. Consent signed and in chart.   Tsosie Billing D PA-C 01/06/2014, 4:22 PM

## 2014-01-06 NOTE — Progress Notes (Signed)
Subjective: Patient developed a fever of 102.9 yesterday which decreased to 101.3 this morning after receiving tylenol. She was started on vancomycin. Her mental status has remained at baseline. Patient endorses chills but denies abdominal pain.  Objective: Vital signs in last 24 hours: Filed Vitals:   01/05/14 2106 01/06/14 0408 01/06/14 0458 01/06/14 0637  BP: 98/61  193/86 152/64  Pulse: 91 106 105 101  Temp: 98.6 F (37 C)  101.4 F (38.6 C) 99.9 F (37.7 C)  TempSrc: Oral  Oral Oral  Resp: 20  20 20   Height:      Weight:      SpO2: 97% 100% 100% 98%   Weight change:   Intake/Output Summary (Last 24 hours) at 01/06/14 1155 Last data filed at 01/06/14 0942  Gross per 24 hour  Intake    240 ml  Output      0 ml  Net    240 ml   Physical Exam:  Gen: Sitting in chair with blanket on shaking  HEENT: scleral icterus present, no mucus membrane lesions noted CV: RRR, no murmurs, rubs, or gallops. Normal S1 and S2. No appreciable JVD.  Resp: CTAB, normal respiratory effort  Abd: soft, mildly distended with bowel sounds present, no caput medusa Ext: no peripheral edema,  No asterixis, no splinter hemorrhages, no osler's nodes Skin: no rash noted Neuro: Alert and oriented x3, no slurred speech, CN II-XII intact  Lab Results: Basic Metabolic Panel:  Recent Labs Lab 01/05/14 0530 01/06/14 0805  NA 144 139  K 3.5* 3.6*  CL 112 106  CO2 18* 17*  GLUCOSE 102* 124*  BUN 53* 54*  CREATININE 2.39* 2.70*  CALCIUM 7.1* 7.1*   Liver Function Tests:  Recent Labs Lab 01/05/14 0530 01/06/14 0805  AST 93* 54*  ALT 56* 48*  ALKPHOS 485* 407*  BILITOT 1.9* 4.5*  PROT 5.0* 5.2*  ALBUMIN 1.9* 1.8*    Recent Labs Lab 01/03/14 2115  LIPASE 116*    Recent Labs Lab 01/04/14 0100 01/04/14 0730  AMMONIA 127* 161*   CBC:  Recent Labs Lab 01/05/14 0530 01/06/14 0805  WBC 3.3* 11.4*  NEUTROABS  --  9.6*  HGB 7.9* 8.4*  HCT 24.5* 25.2*  MCV 108.4* 107.2*  PLT  173 148*   CBG:  Recent Labs Lab 01/05/14 1630 01/05/14 1947 01/05/14 2359 01/06/14 0009 01/06/14 0407 01/06/14 0805  GLUCAP 186* 266* 297* 250* 188* 140*   Coagulation:  Recent Labs Lab 01/04/14 0730  LABPROT 12.4  INR 0.92   Anemia Panel:  Recent Labs Lab 01/04/14 0730  VITAMINB12 564  FERRITIN 611*  TIBC 180*  IRON 90   Urine Drug Screen: Drugs of Abuse     Component Value Date/Time   LABOPIA NONE DETECTED 01/03/2014 2311   LABOPIA NEG 11/13/2013 1516   COCAINSCRNUR NONE DETECTED 01/03/2014 2311   COCAINSCRNUR NEG 11/13/2013 1516   LABBENZ NONE DETECTED 01/03/2014 2311   LABBENZ NEG 11/13/2013 1516   AMPHETMU NONE DETECTED 01/03/2014 2311   THCU NONE DETECTED 01/03/2014 2311   LABBARB NONE DETECTED 01/03/2014 2311   LABBARB NEG 11/13/2013 1516    Alcohol Level: No results found for this basename: ETH,  in the last 168 hours Urinalysis:  Recent Labs Lab 01/03/14 2311  COLORURINE YELLOW  LABSPEC 1.010  PHURINE 6.0  GLUCOSEU 100*  HGBUR NEGATIVE  BILIRUBINUR NEGATIVE  KETONESUR NEGATIVE  PROTEINUR >300*  UROBILINOGEN 1.0  NITRITE NEGATIVE  LEUKOCYTESUR NEGATIVE   Micro Results: Recent Results (from  the past 240 hour(s))  MRSA PCR SCREENING     Status: Abnormal   Collection Time    01/04/14  4:35 AM      Result Value Ref Range Status   MRSA by PCR POSITIVE (*) NEGATIVE Final   Comment:            The GeneXpert MRSA Assay (FDA     approved for NASAL specimens     only), is one component of a     comprehensive MRSA colonization     surveillance program. It is not     intended to diagnose MRSA     infection nor to guide or     monitor treatment for     MRSA infections.     RESULT CALLED TO, READ BACK BY AND VERIFIED WITH:     R.ZELLNER,RN 1610 01/04/14 M.CAMPBELL  CULTURE, BLOOD (ROUTINE X 2)     Status: None   Collection Time    01/05/14  3:30 PM      Result Value Ref Range Status   Specimen Description BLOOD LEFT ARM   Final   Special Requests      Final   Value: BOTTLES DRAWN AEROBIC AND ANAEROBIC 10CC BLUE, 5CC RED   Culture  Setup Time     Final   Value: 01/05/2014 22:48     Performed at [facility]   Culture     Final   Value: GRAM POSITIVE COCCI IN CLUSTERS     Note: Gram Stain Report Called to,Read Back By and Verified With: Volanda Napoleon RN on 01/06/14 at 06:45 by Christie Nottingham     Performed at [facility]   Report Status PENDING   Incomplete  CULTURE, BLOOD (ROUTINE X 2)     Status: None   Collection Time    01/05/14  3:42 PM      Result Value Ref Range Status   Specimen Description BLOOD BLOOD LEFT FOREARM   Final   Special Requests BOTTLES DRAWN AEROBIC ONLY 5CC   Final   Culture  Setup Time     Final   Value: 01/05/2014 22:48     Performed at [facility]   Culture     Final   Value: GRAM POSITIVE COCCI IN CLUSTERS     Note: Gram Stain Report Called to,Read Back By and Verified With: Volanda Napoleon RN on 01/06/14 at 06:45 by Christie Nottingham     Performed at [facility]   Report Status PENDING   Incomplete   Studies/Results: Dg Chest 2 View  01/05/2014   CLINICAL DATA:  Fever, cough, chest pain and shortness of breath.  EXAM: CHEST  2 VIEW  COMPARISON:  Chest x-Dizdarevic 12/14/2013.  FINDINGS: Linear opacity in the lower left lung is unchanged, compatible with an area of mild scarring. Right internal jugular single-lumen power porta cath with tip terminating in the distal superior vena cava is unchanged. Lung volumes are low. No consolidative airspace disease. No pleural effusions. No pneumothorax. No pulmonary nodule or mass noted. Pulmonary vasculature and the cardiomediastinal silhouette are within normal limits.  IMPRESSION: 1. Low lung volumes without radiographic evidence of acute cardiopulmonary disease. 2. Mild scarring in the inferior aspect of the left lung, similar prior examinations.   Electronically Signed   By: Trudie Reed M.D.   On: 01/05/2014 19:15   Medications: I have reviewed  the patient's current medications. Scheduled Meds: . abacavir  300 mg Oral BID  . acyclovir  800 mg Oral Daily  . aspirin EC  81 mg Oral Daily  . Chlorhexidine Gluconate Cloth  6 each Topical Q0600  . cloNIDine  0.3 mg Oral TID  . cloNIDine  0.3 mg Oral Once  . Darunavir Ethanolate  800 mg Oral Q breakfast  . [START ON 01/08/2014] enoxaparin (LOVENOX) injection  30 mg Subcutaneous Q24H  . furosemide  40 mg Intravenous BID  . furosemide  40 mg Oral BID  . hydrALAZINE  50 mg Oral TID  . hydrocerin   Topical BID  . insulin aspart  0-9 Units Subcutaneous 6 times per day  . insulin glargine  20 Units Subcutaneous QHS  . labetalol  400 mg Oral BID  . lactulose  20 g Oral TID  . lamiVUDine  100 mg Oral Daily  . levETIRAcetam  1,000 mg Oral BID  . lisinopril  40 mg Oral Daily  . magic mouthwash w/lidocaine  5 mL Oral TID  . mupirocin ointment  1 application Nasal BID  . pantoprazole  40 mg Oral Daily  . ritonavir  100 mg Oral Q breakfast  . sodium bicarbonate  650 mg Oral BID  . sodium chloride  3 mL Intravenous Q12H  . [START ON 01/07/2014] vancomycin  750 mg Intravenous Q24H   Continuous Infusions:  PRN Meds:.hydrOXYzine, menthol-cetylpyridinium, ondansetron (ZOFRAN) IV, ondansetron Assessment/Plan: Principal Problem:   Gram-positive cocci bacteremia Active Problems:   HIV disease   CKD (chronic kidney disease) stage 4, GFR 15-29 ml/min   Diabetes   HCV (hepatitis C virus)   Anemia   Metabolic acidosis, increased anion gap   Altered mental state   Hepatic encephalopathy   Ascites   Fever, unspecified  Assessment: [name] is a 53 year old woman with PMH significant for HIV and Hep C coinfection complicated by cirrhotic liver, CKD stage IV, stroke, CHF, DMII who was admitted after being brought to the ED by family due to increased lethargy, which resolved, but has now developed new onset fever in the setting of staph bacteremia as indicated by blood cultures.    Fever: New  onset. Patient spiked a fever of 102.9 and is currently at 101.3. She was started on vancomycin. Blood cultures x2 were collected when she spiked the fever of 102.9 and both grew gram positive cocci with clusters. Concern for possible endocarditis due to staph bacteremia. No physical exam findings of endocarditis noticed on exam. IR was contacted to remove portacath, as it is likely source of infection.  -Continue vancomycin -Portacath removal by IR pending  -Ordered TEE, talked to cards and they said they could do it Wed (8/12) -Transfer to stepdown with low threshold to transfer to CC if clinical picture worsens  Hepatic Encephalopathy: Resolved. She arrived lethargic which was concerning for acute hepatic encephalopathy. Ammonia level on admission was 127 and increased to 161, but she is back to her baseline mental status today.  -Continue home lactulose PO PRN titrated to 3BM/day  -Diet heart healthy/ Carb mod only if fully alert   Ascites: Not concerning for SBP at this time. Soft and obese abdomen, only mildly distended and not tense. CT abdomen/pelvis on presentation with only mild ascities, no acute intraabdominal process. She denies increased abdominal distention. She does not complain of SOB or dyspnea. -Will consider diagnostic/therapeutic tap of ascitic fluid if she declines   History of seizure:  Corrected Dilantin level per pharmacy was 25 on 8/9. This has been trending down since d/c of dilantin. Pharmacy on board  and recommend getting repeat level. They also mentioned the possible need to adjust HIV medications due to concern for interaction. -Dilantin level pending -Continue Keppra   Hepatitis C: Genotype in December 2014. Genotype 1b, viral load of [id number], Log of 6.63.  -She will f/u with Dr. Drue Second in ID for possible treatment of Hep C   HIV disease: Well controlled with viral load undectable in 09/08/13. CD4 count in April at 280. Will recheck labs as dilantin level has been  super therapeutic and can interact with antiretrovirals. Pharmacy recommended decreasing lamivudine dose from 150mg  to 100mg  daily to improve renal function. Appreciate recommendation and will make adjustment.  -Repeat CD4  -Recheck viral load  -Continue home abacavir, prezista, and norvir -Decreased Epivir from 150mg  to 100mg  daily -Continue acyclovir for prophylaxis   HTN: BP mildly elevated initially but improved after home antihypertensives were given. BP today is 152/64.  -Per pharmacy, home clonidine was increased from clonidine 0.3mg  BID to TID, and labetalol was increased to 400mg  BID -home hydralazine 50mg  TID and lisinopril 40mg  daily.  -continue home clonidine 0.3mg  BID, hydralazine 50mg  TID, labetalol 00mg  BID, lisinopril 40mg  daily.  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 Secondary to Hepatitis C infection. 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 Platelets and INR stable.   DMII: Last Hg A1C 7.5% on 11/13/13. On Lantus 30 u qHS and Novolog 10-25 u as needed at home. Glucose levels over the past day have been running high >200. Will talk with nursing about giving SSI before meals.  -Lantus 20 units qHS  -Sensitive SSI   Chronic Anemia: Likely anemia of chronic disease. Her baseline Hg is ~8. Two weeks ago her hemoglobin was lower around 6.7; since then it has been steadily increasing. Today her HbG is 8.2.   CKD, stage 4: Stable. Cr today is 2.39, which seems to be her baseline. GFR is 25, also is close to baseline.   DVT prophylaxis: Lovenox [place]   Diet:  NPO  Dispo: Disposition is deferred at this time, awaiting improvement of current medical problems. Patient transferred to [facility].   This is a Psychologist, occupational Note.  The care of the patient was discussed with Dr. Garald Braver and the assessment and plan formulated with their assistance.  Please see their attached note for official documentation of the daily encounter.   LOS: 3 days    Lillia Carmel, Med Student 01/06/2014, 11:55 AM

## 2014-01-06 NOTE — Progress Notes (Signed)
Transferred - in from 5 west bed awake and alert.

## 2014-01-06 NOTE — Progress Notes (Signed)
Also see attending note

## 2014-01-06 NOTE — Progress Notes (Signed)
  Date: 01/06/2014  Patient name: Michelle [name]  Medical record number: [id number]  Date of birth: [birth year]   This patient has been seen and the plan of care was discussed with the house staff. Please see their note for complete details. I concur with their findings with the following additions/corrections:  Patient has developed SIRS due to gram positive cocci bacteremia. Have strong suspicion for MSSA/MRSA. Will add cefazolin in addition to vancomycin today. Patient has portacath that would be concerning for source of infection. Will have her transferred to step down for closer monitoring.   Judyann Munson, MD 01/06/2014, 1:55 PM

## 2014-01-06 NOTE — Evaluation (Signed)
Occupational Therapy Evaluation Patient Details Name: Michelle [name] MRN: [id number] DOB: 02-08-1961 Today's Date: 01/06/2014    History of Present Illness Patient is a 53 y/o female admitted from home with increased lethargy for the last 24 hours and AMS. Diagnosed with hepatic encephalopathy and ascites. PMH positive for cirrhotic liver secondary to Hep C infection, HIV, CKD stage IV, CVA, DM II, seizures, HTN and chronic anemia.   Clinical Impression   Pt with severe confusion at this time and decreased ability to follow one step instructional commands or answer questions about PLOF.  Most of the time questions or commands needed to be repeated and pt would just reply "what".  She was however able to transfer to the [facility] with min assist but needed max assist for hygiene from slight bowel incontinence.  Mod to max assist currently for selfcare tasks overall based on cognitive impairment.  Will continue to follow in acute care as pt needs to reach supervision level for discharge home with her sister.  Feel she will definitely need 24 hour supervision.    Follow Up Recommendations  Home health OT;Supervision/Assistance - 24 hour    Equipment Recommendations  Other (comment) (will update during treatment)       Precautions / Restrictions Precautions Precautions: Fall;Other (comment) Precaution Comments: Contact precautions. Restrictions Weight Bearing Restrictions: No      Mobility Bed Mobility Overal bed mobility: Needs Assistance Bed Mobility: Supine to Sit   Sidelying to sit: Max assist       General bed mobility comments: Pt needed demonstrational cueing and max assist to move her LEs off of the bed and initiate sitting up.  Transfers Overall transfer level: Needs assistance   Transfers: Sit to/from Stand;Stand Pivot Transfers Sit to Stand: Min assist Stand pivot transfers: Min assist       General transfer comment: Pt transferred to the bedside commode with min  assist.    Balance Overall balance assessment: Needs assistance Sitting-balance support: Bilateral upper extremity supported Sitting balance-Leahy Scale: Fair     Standing balance support: No upper extremity supported Standing balance-Leahy Scale: Poor                              ADL Overall ADL's : Needs assistance/impaired Eating/Feeding: NPO   Grooming: Moderate assistance;Sitting               Lower Body Dressing: Maximal assistance (to donn gripper socks sitting EOB)   Toilet Transfer: Maximal assistance;Cueing for safety;Cueing for sequencing Toilet Transfer Details (indicate cue type and reason): Pt needed max demonstrational cueing to transfer stand pivot to the bedside commode. Toileting- Clothing Manipulation and Hygiene: Cueing for sequencing;Maximal assistance       Functional mobility during ADLs: Minimal assistance General ADL Comments: Pt inconsistent with initiation and completion of simple one step commands.  Would demonstrate delayed verbal response to therapist's questions or to commands and constantly respond "what" to every question asked.  At one point pt stated that her sister just moved here but when therapist asked if she worked or was there all the time she could not understand.  Therapist re-phrased the question multiple ways but she was still unable to answer.        Perception Perception Perception Tested?: No   Praxis Praxis Praxis tested?: Not tested    Pertinent Vitals/Pain Pain Assessment: No/denies pain     Hand Dominance Right   Extremity/Trunk Assessment Upper Extremity Assessment  Upper Extremity Assessment: Difficult to assess due to impaired cognition (Pt able to move both UEs spontaneously and grip strength 4/5 bilaterally.  Pt unable to follow demonstrational commands for further MMT however)   Lower Extremity Assessment Lower Extremity Assessment: Defer to PT evaluation   Cervical / Trunk  Assessment Cervical / Trunk Assessment: Normal   Communication Communication Communication: No difficulties   Cognition Arousal/Alertness: Lethargic Behavior During Therapy: Flat affect Overall Cognitive Status: Impaired/Different from baseline Area of Impairment: Orientation;Memory;Attention;Following commands;Safety/judgement;Awareness;Problem solving Orientation Level: Time;Situation Current Attention Level: Focused Memory: Decreased short-term memory Following Commands: Follows one step commands inconsistently Safety/Judgement: Decreased awareness of safety   Problem Solving: Slow processing;Decreased initiation;Difficulty sequencing;Requires verbal cues General Comments: Pt needed repetition of questions or commands from therapist for pt to initiate.  Most of the time when asked pt would state over and over "what".  only followed approximately 30% of simple one step commmands.  Pt not oriented to time or situation.  Poor historian of PLOF and home situation.               Home Living Family/patient expects to be discharged to:: Private residence Living Arrangements: Other relatives Available Help at Discharge: Family;Available PRN/intermittently Type of Home: Apartment Home Access: Level entry     Home Layout: One level     Bathroom Shower/Tub: Tub/shower unit Shower/tub characteristics: Curtain FirefighterBathroom Toilet: Standard Bathroom Accessibility: Yes   Home Equipment: Cane - single point;Grab bars - tub/shower;Grab bars - toilet;Walker - standard;Walker - 2 wheels;Toilet riser;Shower seat   Additional Comments: Pt poor historian and not able to provide history/PLOF. No family members present.  Pt reports sister lives with her.      Prior Functioning/Environment Level of Independence: Independent with assistive device(s)        Comments: Pt reports using RW at home.    OT Diagnosis: Generalized weakness;Cognitive deficits;Altered mental status   OT Problem  List: Decreased strength;Decreased activity tolerance;Decreased cognition;Impaired balance (sitting and/or standing);Decreased safety awareness;Decreased knowledge of use of DME or AE   OT Treatment/Interventions: Self-care/ADL training;Patient/family education;Neuromuscular education;Balance training;Therapeutic activities;DME and/or AE instruction    OT Goals(Current goals can be found in the care plan section) Acute Rehab OT Goals Patient Stated Goal: Pt unable to state this session except that she wanted to lay back down. OT Goal Formulation: Patient unable to participate in goal setting Time For Goal Achievement: 01/20/14 Potential to Achieve Goals: Good  OT Frequency: Min 2X/week              End of Session Nurse Communication: Mobility status  Activity Tolerance: Other (comment) (treatment limited secondary to pt's cognitive level) Patient left: in bed;with call bell/phone within reach;with bed alarm set   Time: 1551-1620 OT Time Calculation (min): 29 min Charges:  OT General Charges $OT Visit: 1 Procedure OT Evaluation $Initial OT Evaluation Tier I: 1 Procedure OT Treatments $Self Care/Home Management : 8-22 mins G-Codes:    Jamilet Ambroise OTR/L 01/06/2014, 4:39 PM

## 2014-01-06 NOTE — Progress Notes (Signed)
ANTIBIOTIC CONSULT NOTE - INITIAL  Pharmacy Consult for Vancomycin Indication: bacteremia  Allergies  Allergen Reactions  . 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 Morphine And Related Hives, Itching and Rash  . Norvasc [Amlodipine Besylate] Hives, Itching and Rash    Patient Measurements: Height: 5\' 5"  (165.1 cm) Weight: 172 lb 6.4 oz (78.2 kg) IBW/kg (Calculated) : 57  Vital Signs: Temp: 99.9 F (37.7 C) (08/10 0637) Temp src: Oral (08/10 0637) BP: 152/64 mmHg (08/10 0637) Pulse Rate: 101 (08/10 0637) Intake/Output from previous day: 08/09 0701 - 08/10 0700 In: 360 [P.O.:360] Out: -  Intake/Output from this shift:    Labs:  Recent Labs  01/03/14 2115 01/04/14 0730 01/05/14 0530  WBC 4.6 3.5* 3.3*  HGB 7.8* 8.2* 7.9*  PLT 171 163 173  CREATININE 2.44* 2.30* 2.39*   Estimated Creatinine Clearance: 27.8 ml/min (by C-G formula based on Cr of 2.39). No results found for this basename: Rolm Gala, VANCORANDOM, GENTTROUGH, GENTPEAK, GENTRANDOM, TOBRATROUGH, TOBRAPEAK, TOBRARND, AMIKACINPEAK, AMIKACINTROU, AMIKACIN,  in the last 72 hours   Microbiology: Recent Results (from the past 720 hour(s))  URINE CULTURE     Status: None   Collection Time    12/17/13  9:55 AM      Result Value Ref Range Status   Specimen Description URINE, CATHETERIZED   Final   Special Requests NONE   Final   Culture  Setup Time     Final   Value: 12/17/2013 13:41     Performed at [facility] Count     Final   Value: NO GROWTH     Performed at [facility]   Culture     Final   Value: NO GROWTH     Performed at [facility]   Report Status 12/18/2013 FINAL   Final  CLOSTRIDIUM DIFFICILE BY PCR     Status: None   Collection Time    12/17/13  7:58 PM      Result Value Ref Range Status   C difficile by pcr NEGATIVE  NEGATIVE Final  URINE CULTURE     Status: None   Collection Time    12/20/13  6:24 PM      Result Value Ref Range Status   Specimen Description URINE, CLEAN CATCH   Final   Special Requests NONE   Final   Culture  Setup Time     Final   Value: 12/21/2013 01:19     Performed at [facility] Count     Final   Value: >=100,000 COLONIES/ML     Performed at [facility]   Culture     Final   Value: Multiple bacterial morphotypes present, none predominant. Suggest appropriate recollection if clinically indicated.     Performed at [facility]   Report Status 12/22/2013 FINAL   Final  MRSA PCR SCREENING     Status: Abnormal   Collection Time    01/04/14  4:35 AM      Result Value Ref Range Status   MRSA by PCR POSITIVE (*) NEGATIVE Final   Comment:            The GeneXpert MRSA Assay (FDA     approved for NASAL specimens     only), is one component of a     comprehensive MRSA colonization     surveillance program. It is not  intended to diagnose MRSA     infection nor to guide or     monitor treatment for     MRSA infections.     RESULT CALLED TO, READ BACK BY AND VERIFIED WITH:     R.ZELLNER,RN [id number] 01/04/14 M.CAMPBELL  CULTURE, BLOOD (ROUTINE X 2)     Status: None   Collection Time    01/05/14  3:30 PM      Result Value Ref Range Status   Specimen Description BLOOD LEFT ARM   Final   Special Requests     Final   Value: BOTTLES DRAWN AEROBIC AND ANAEROBIC 10CC BLUE, 5CC RED   Culture  Setup Time     Final   Value: 01/05/2014 22:48     [facility]   Culture     Final   Value: GRAM POSITIVE COCCI IN CLUSTERS     Note: Gram Stain Report Called to,Read Back By and Verified With: Volanda NapoleonNikki Faucette RN on 01/06/14 at 06:45 by Christie NottinghamAnne Skeen     [facility] at [facility]   Report Status PENDING   Incomplete  CULTURE, BLOOD (ROUTINE X 2)     Status: None   Collection Time    01/05/14  3:42 PM      Result Value Ref Range Status   Specimen Description BLOOD BLOOD LEFT  FOREARM   Final   Special Requests BOTTLES DRAWN AEROBIC ONLY 5CC   Final   Culture  Setup Time     Final   Value: 01/05/2014 22:48     [facility]   Culture     Final   Value: GRAM POSITIVE COCCI IN CLUSTERS     Note: Gram Stain Report Called to,Read Back By and Verified With: Volanda NapoleonNikki Faucette RN on 01/06/14 at 06:45 by Christie NottinghamAnne Skeen     [facility]   Report Status PENDING   Incomplete    Medical History: Past Medical History  Diagnosis Date  . Stroke   . Meningitis   . HIV (human immunodeficiency virus infection) 1981  . Hypertension   . Gout   . Muscle spasms of head and/or neck   . CHF (congestive heart failure)     [name]/notes 06/18/2013  . HCV (hepatitis C virus)     chronic/notes 06/18/2013  . Type II diabetes mellitus     [name]/notes 06/18/2013  . AIHA (autoimmune hemolytic anemia)     [name]/notes 06/18/2013  . Hypertensive encephalopathy ~ 05/2013    hospitalaized/notes 06/18/2013  . Exertional shortness of breath     [name]/notes 06/18/2013  . Anxiety     [name]/notes 06/18/2013  . History of syphilis   . High cholesterol   . CKD (chronic kidney disease), stage III   . Nephrotic syndrome   . Cirrhosis     hepatitis C   . GERD (gastroesophageal reflux disease)   . Migraine     "all the time" (11/27/2013)  . Seizures     "last sz was week before last" (11/27/2013)  . Psychosis   . Chronic venous stasis dermatitis of both lower extremities     Medications:  Scheduled:  . abacavir  300 mg Oral BID  . acyclovir  800 mg Oral Daily  . aspirin EC  81 mg Oral Daily  . Chlorhexidine Gluconate Cloth  6 each Topical Q0600  . cloNIDine  0.3 mg Oral TID  . cloNIDine  0.3 mg Oral Once  . Darunavir  Ethanolate  800 mg Oral Q breakfast  . enoxaparin (LOVENOX) injection  30 mg Subcutaneous Q24H  . furosemide  40 mg Intravenous BID  . furosemide  40 mg Oral BID  . hydrALAZINE  50 mg Oral TID  . hydrocerin   Topical BID  . insulin aspart  0-9 Units Subcutaneous 6  times per day  . insulin glargine  20 Units Subcutaneous QHS  . labetalol  400 mg Oral BID  . lactulose  20 g Oral TID  . lamiVUDine  150 mg Oral Daily  . levETIRAcetam  1,000 mg Oral BID  . lisinopril  40 mg Oral Daily  . magic mouthwash w/lidocaine  5 mL Oral TID  . mupirocin ointment  1 application Nasal BID  . pantoprazole  40 mg Oral Daily  . ritonavir  100 mg Oral Q breakfast  . sodium bicarbonate  650 mg Oral BID  . sodium chloride  3 mL Intravenous Q12H   Assessment: 53 yo female with 1/2 blood cultures growing GPC for empiric antibtiotics  Goal of Therapy:  Vancomycin trough level 15-20 mcg/ml  Plan:  Vancomycin 1250 mg IV now, then 750 mg IV q24h  Kayon Dozier, Gary Fleet 01/06/2014,6:55 AM

## 2014-01-06 NOTE — Evaluation (Signed)
Physical Therapy Evaluation Patient Details Name: Michelle [name] MRN: [id number] DOB: [birth year] Today's Date: 01/06/2014   History of Present Illness  Patient is a 53 y/o female admitted from home with increased lethargy for the last 24 hours and AMS. Diagnosed with hepatic encephalopathy and ascites. PMH positive for cirrhotic liver secondary to Hep C infection, HIV, CKD stage IV, CVA, DM II, seizures, HTN and chronic anemia.   Clinical Impression  Patient presents with functional limitations due to deficits listed in PT problem list (see below). Pt with confusion and cognitive deficits limiting safe mobility. Unsure of pt's cognitive and functional baseline as pt poor historian. Pt required constant verbal and tactile cues for redirection and to stay on task as pt with short term memory deficits and poor safety awareness/judgment. SOB present with exercise PRN. Pt would benefit from skilled PT in acute setting to improve safe mobility and maximize independence so pt can safely return to PLOF. If sister not able to provide 24/7 supervision at home, pt may need ST SNF for safety.    Follow Up Recommendations Home health PT;Supervision/Assistance - 24 hour (Recommend hands on assist at this time for safety. If sister not able to provide this level of care, recommend ST SNF)    Equipment Recommendations  None recommended by PT    Recommendations for Other Services       Precautions / Restrictions Precautions Precautions: Fall;Other (comment) Precaution Comments: Contact precautions. Restrictions Weight Bearing Restrictions: No      Mobility  Bed Mobility Overal bed mobility: Needs Assistance Bed Mobility: Sidelying to Sit   Sidelying to sit: Min assist;HOB elevated       General bed mobility comments: VC for sequencing and to reach LUE to hand rail, scoot bottom to EOB and assist with trunk to get to upright. Increased time.  Transfers Overall transfer level: Needs  assistance Equipment used: Rolling walker (2 wheeled) Transfers: Sit to/from Stand Sit to Stand: Min assist         General transfer comment: Stood x1 from regular bed height with constant VC to remind pt to stand, "wait what do you want me to do?" Increased time and difficulty with initiation/sequencing. Min guard for safety as pt unsteady initially upon standing with LOB anteriorly.  Ambulation/Gait Ambulation/Gait assistance: Min assist Ambulation Distance (Feet): 45 Feet Assistive device: Rolling walker (2 wheeled) Gait Pattern/deviations: Step-through pattern;Trunk flexed;Narrow base of support Gait velocity: Decreased Gait velocity interpretation: Below normal speed for age/gender General Gait Details: Difficulty staying on task during ambulation with multiple rest breaks due to distraction from environment. Difficulty problem solving getting RW stuck on BSC and on doorway- required Min A to mobilize RW. Small, short steps with posterior trunk lean throughout gait. Unsteady.   Stairs            Wheelchair Mobility    Modified Rankin (Stroke Patients Only)       Balance Overall balance assessment: Needs assistance   Sitting balance-Leahy Scale: Fair       Standing balance-Leahy Scale: Poor Standing balance comment: Requires use of RW for support secondary to poor dynamic standing balance. Required Min A upon standing initially to prevent LOB anteriorly and Min A during gait training due to posterior lean and poor safety awareness.                             Pertinent Vitals/Pain Pain Assessment: 0-10 Pain Location: Reports pain in left  shoulder and neck, not able to rate on scale. Pain Descriptors / Indicators: Sore Pain Intervention(s): Monitored during session;Repositioned    Home Living Family/patient expects to be discharged to:: Private residence Living Arrangements: Other relatives Available Help at Discharge: Family;Available  PRN/intermittently Type of Home: Apartment Home Access: Level entry     Home Layout: One level Home Equipment: Cane - single point;Grab bars - tub/shower;Grab bars - toilet;Walker - standard;Walker - 2 wheels;Toilet riser Additional Comments: Pt poor historian and not able to provide history/PLOF. No family members present.  Pt reports sister lives with her.    Prior Function Level of Independence: Independent with assistive device(s)         Comments: Pt reports using RW at home.     Hand Dominance   Dominant Hand: Right    Extremity/Trunk Assessment   Upper Extremity Assessment: Defer to OT evaluation           Lower Extremity Assessment: Generalized weakness;Difficult to assess due to impaired cognition (As observed during functional tasks/mobility.)         Communication   Communication: No difficulties  Cognition Arousal/Alertness: Awake/alert Behavior During Therapy: WFL for tasks assessed/performed Overall Cognitive Status: Impaired/Different from baseline Area of Impairment: Orientation;Attention;Following commands;Safety/judgement;Awareness;Problem solving Orientation Level: Disoriented to;Time;Situation Current Attention Level: Alternating Memory: Decreased recall of precautions;Decreased short-term memory Following Commands: Follows one step commands with increased time;Follows one step commands inconsistently Safety/Judgement: Decreased awareness of safety;Decreased awareness of deficits   Problem Solving: Slow processing;Difficulty sequencing;Requires verbal cues;Requires tactile cues General Comments: Pt with difficulty following simple 1 step commands with repetition and increased time, constantly perseverating on what therapist says- and then not able to recall the task requested, "what", huh" wait what?" Distracted. Redirectable at times. Contradicting self during conversation and not able to recall conversations within session, "save my coffee,  no I  don't want my coffee, wait yes I want m y coffee."    General Comments General comments (skin integrity, edema, etc.): Discoloration and darkened skin patches BLEs distal to knee. Grunting noises audible throughout evaluation with intermittent SOB. Tachycardic at rest and during exercise RHR 102 bpm.    Exercises        Assessment/Plan    PT Assessment Patient needs continued PT services  PT Diagnosis Difficulty walking;Generalized weakness   PT Problem List Decreased strength;Cardiopulmonary status limiting activity;Pain;Decreased cognition;Decreased activity tolerance;Decreased knowledge of use of DME;Decreased balance;Decreased safety awareness;Decreased mobility;Decreased knowledge of precautions  PT Treatment Interventions DME instruction;Balance training;Gait training;Cognitive remediation;Functional mobility training;Patient/family education;Therapeutic activities;Therapeutic exercise   PT Goals (Current goals can be found in the Care Plan section) Acute Rehab PT Goals PT Goal Formulation: Patient unable to participate in goal setting    Frequency Min 3X/week   Barriers to discharge Decreased caregiver support Not sure how often sister is present at home during day.    Co-evaluation               End of Session Equipment Utilized During Treatment: Gait belt Activity Tolerance: Patient tolerated treatment well Patient left: in chair;with call bell/phone within reach Nurse Communication: Mobility status;Precautions         Time: [id number] PT Time Calculation (min): 25 min   Charges:   PT Evaluation $Initial PT Evaluation Tier I: 1 Procedure PT Treatments $Therapeutic Activity: 8-22 mins   PT G CodesAlvie Heidelberg:          Folan, Lazariah Savard A 01/06/2014, 10:24 AM Alvie HeidelbergShauna Folan, PT, DPT [phone]

## 2014-01-06 NOTE — Progress Notes (Signed)
ANTIBIOTIC CONSULT NOTE - INITIAL  Pharmacy Consult for Cefazolin Indication: Bacteremia  Allergies  Allergen Reactions  . 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 Morphine And Related Hives, Itching and Rash  . Norvasc [Amlodipine Besylate] Hives, Itching and Rash    Patient Measurements: Height: 5\' 5"  (165.1 cm) Weight: 172 lb 6.4 oz (78.2 kg) IBW/kg (Calculated) : 57  Vital Signs: Temp: 101.3 F (38.5 C) (08/10 1237) Temp src: Oral (08/10 0637) BP: 159/71 mmHg (08/10 1400) Pulse Rate: 103 (08/10 1400) Intake/Output from previous day: 08/09 0701 - 08/10 0700 In: 360 [P.O.:360] Out: -  Intake/Output from this shift: Total I/O In: 240 [P.O.:240] Out: -   Labs:  Recent Labs  01/04/14 0730 01/05/14 0530 01/06/14 0805  WBC 3.5* 3.3* 11.4*  HGB 8.2* 7.9* 8.4*  PLT 163 173 148*  CREATININE 2.30* 2.39* 2.70*   Estimated Creatinine Clearance: 24.6 ml/min (by C-G formula based on Cr of 2.7).  Assessment: 52yof who was started on vancomycin earlier today for gram + cocci in her blood cultures to now begin cefazolin as well. Strong suspicion for MSSA/MRSA. She has CKD and sCr is elevated at 2.7 (baseline appears to be 1.8-2).  8/10 Vancomycin>> 8/10 Cefazoline>> 8/9 blood x2>> 2/2 gram + cocci in clusters  Goal of Therapy:  Appropriate cefazolin dosing  Plan:  1) Cefazolin 2g IV q12 2) Continue to follow renal function, cultures  Fredrik Rigger 01/06/2014,3:08 PM

## 2014-01-06 NOTE — Progress Notes (Signed)
Patient is doing well. Denies fever, chills, nausea, vomiting, or abdominal pain.  Filed Vitals:   01/06/14 1621 01/06/14 1634 01/06/14 1700 01/06/14 1900  BP: 147/75  98/51 138/68  Pulse: 94 93  88  Temp:  101 F (38.3 C)  99.7 F (37.6 C)  TempSrc:  Oral  Oral  Resp: 20   16  Height:      Weight:      SpO2: 97% 92%  100%   General: NAD, sleeping in bed, arouses to voice CV: RRR Resp: CTAB Abdomen: soft, minimally tender to palpation in RUQ, mildly distended Neuro: alert and oriented x 3, answers questions appropriately  Discontinue hypertension meds as BP was 98/51 @1700  which is back up to 138/68 at recheck. Will give prn hypertension meds Continue to monitor  Patient seen and discussed with senior resident, Dr. Danella Sensing, PGY1 Internal Medicine Teaching Service

## 2014-01-07 ENCOUNTER — Ambulatory Visit: Payer: Self-pay | Admitting: Internal Medicine

## 2014-01-07 ENCOUNTER — Inpatient Hospital Stay (HOSPITAL_COMMUNITY): Payer: Medicaid Other

## 2014-01-07 DIAGNOSIS — A419 Sepsis, unspecified organism: Secondary | ICD-10-CM

## 2014-01-07 DIAGNOSIS — A4101 Sepsis due to Methicillin susceptible Staphylococcus aureus: Principal | ICD-10-CM

## 2014-01-07 DIAGNOSIS — G9340 Encephalopathy, unspecified: Secondary | ICD-10-CM

## 2014-01-07 DIAGNOSIS — N179 Acute kidney failure, unspecified: Secondary | ICD-10-CM

## 2014-01-07 DIAGNOSIS — E876 Hypokalemia: Secondary | ICD-10-CM

## 2014-01-07 LAB — GLUCOSE, CAPILLARY
GLUCOSE-CAPILLARY: 134 mg/dL — AB (ref 70–99)
GLUCOSE-CAPILLARY: 168 mg/dL — AB (ref 70–99)
GLUCOSE-CAPILLARY: 179 mg/dL — AB (ref 70–99)
GLUCOSE-CAPILLARY: 196 mg/dL — AB (ref 70–99)
GLUCOSE-CAPILLARY: 204 mg/dL — AB (ref 70–99)
GLUCOSE-CAPILLARY: 294 mg/dL — AB (ref 70–99)
Glucose-Capillary: 152 mg/dL — ABNORMAL HIGH (ref 70–99)

## 2014-01-07 LAB — COMPREHENSIVE METABOLIC PANEL
ALBUMIN: 1.7 g/dL — AB (ref 3.5–5.2)
ALT: 37 U/L — ABNORMAL HIGH (ref 0–35)
ANION GAP: 17 — AB (ref 5–15)
AST: 45 U/L — AB (ref 0–37)
Alkaline Phosphatase: 350 U/L — ABNORMAL HIGH (ref 39–117)
BUN: 61 mg/dL — ABNORMAL HIGH (ref 6–23)
CO2: 16 mEq/L — ABNORMAL LOW (ref 19–32)
CREATININE: 3.44 mg/dL — AB (ref 0.50–1.10)
Calcium: 6.9 mg/dL — ABNORMAL LOW (ref 8.4–10.5)
Chloride: 103 mEq/L (ref 96–112)
GFR calc Af Amer: 16 mL/min — ABNORMAL LOW (ref >90)
GFR calc non Af Amer: 14 mL/min — ABNORMAL LOW (ref >90)
Glucose, Bld: 166 mg/dL — ABNORMAL HIGH (ref 70–99)
Potassium: 3.5 mEq/L — ABNORMAL LOW (ref 3.7–5.3)
Sodium: 136 mEq/L — ABNORMAL LOW (ref 137–147)
Total Bilirubin: 5.5 mg/dL — ABNORMAL HIGH (ref 0.3–1.2)
Total Protein: 5.3 g/dL — ABNORMAL LOW (ref 6.0–8.3)

## 2014-01-07 LAB — CBC WITH DIFFERENTIAL/PLATELET
Basophils Absolute: 0 10*3/uL (ref 0.0–0.1)
Basophils Relative: 0 % (ref 0–1)
EOS ABS: 0 10*3/uL (ref 0.0–0.7)
Eosinophils Relative: 0 % (ref 0–5)
HCT: 23.1 % — ABNORMAL LOW (ref 36.0–46.0)
Hemoglobin: 7.8 g/dL — ABNORMAL LOW (ref 12.0–15.0)
Lymphocytes Relative: 5 % — ABNORMAL LOW (ref 12–46)
Lymphs Abs: 0.6 10*3/uL — ABNORMAL LOW (ref 0.7–4.0)
MCH: 35.3 pg — AB (ref 26.0–34.0)
MCHC: 33.8 g/dL (ref 30.0–36.0)
MCV: 104.5 fL — ABNORMAL HIGH (ref 78.0–100.0)
MONO ABS: 1.3 10*3/uL — AB (ref 0.1–1.0)
Monocytes Relative: 11 % (ref 3–12)
NEUTROS PCT: 84 % — AB (ref 43–77)
Neutro Abs: 10.2 10*3/uL — ABNORMAL HIGH (ref 1.7–7.7)
PLATELETS: 133 10*3/uL — AB (ref 150–400)
RBC: 2.21 MIL/uL — ABNORMAL LOW (ref 3.87–5.11)
RDW: 14.1 % (ref 11.5–15.5)
WBC: 12.1 10*3/uL — ABNORMAL HIGH (ref 4.0–10.5)

## 2014-01-07 LAB — PHENYTOIN LEVEL, TOTAL: Phenytoin Lvl: 6.5 ug/mL — ABNORMAL LOW (ref 10.0–20.0)

## 2014-01-07 LAB — HAPTOGLOBIN: Haptoglobin: 54 mg/dL (ref 45–215)

## 2014-01-07 MED ORDER — MIDAZOLAM HCL 2 MG/2ML IJ SOLN
INTRAMUSCULAR | Status: AC
  Filled 2014-01-07: qty 2

## 2014-01-07 MED ORDER — VANCOMYCIN HCL IN DEXTROSE 750-5 MG/150ML-% IV SOLN
750.0000 mg | INTRAVENOUS | Status: DC
  Filled 2014-01-07: qty 150

## 2014-01-07 MED ORDER — LEVETIRACETAM 500 MG PO TABS
500.0000 mg | ORAL_TABLET | Freq: Two times a day (BID) | ORAL | Status: DC
  Administered 2014-01-07 – 2014-01-15 (×17): 500 mg via ORAL
  Filled 2014-01-07 (×18): qty 1

## 2014-01-07 MED ORDER — LAMIVUDINE 10 MG/ML PO SOLN
50.0000 mg | Freq: Every day | ORAL | Status: DC
  Administered 2014-01-08 – 2014-01-15 (×8): 50 mg via ORAL
  Filled 2014-01-07 (×8): qty 5

## 2014-01-07 MED ORDER — LIDOCAINE HCL 1 % IJ SOLN
INTRAMUSCULAR | Status: AC
  Filled 2014-01-07: qty 20

## 2014-01-07 MED ORDER — LORAZEPAM 2 MG/ML IJ SOLN
INTRAMUSCULAR | Status: AC | PRN
  Administered 2014-01-07: 1 mg via INTRAVENOUS

## 2014-01-07 MED ORDER — FENTANYL CITRATE 0.05 MG/ML IJ SOLN
INTRAMUSCULAR | Status: AC | PRN
Start: 2014-01-07 — End: 2014-01-07
  Administered 2014-01-07: 25 ug via INTRAVENOUS

## 2014-01-07 MED ORDER — FENTANYL CITRATE 0.05 MG/ML IJ SOLN
INTRAMUSCULAR | Status: AC
  Filled 2014-01-07: qty 2

## 2014-01-07 NOTE — Progress Notes (Signed)
IV team paged regarding need for access. Will await return page. 01/07/2014 6:33 PM Eileen Stanford

## 2014-01-07 NOTE — Progress Notes (Signed)
Report given to [place], RN, on 5W. Pt updtaed with transfer. 01/07/2014 6:35 PM Eileen Stanford

## 2014-01-07 NOTE — Progress Notes (Signed)
Subjective: She was febrile overnight with T max of 102.88F. She had one outlier of BP read but remains hypertensive today. Her mentation is at her baseline. She denies SOB, chest pain, headache, abdominal pain, or dysuria.   Objective: Vital signs in last 24 hours: Filed Vitals:   01/07/14 0925 01/07/14 0930 01/07/14 0959 01/07/14 1244  BP: 130/62 132/65 137/68 168/75  Pulse: 84 85 87 95  Temp:   99.6 F (37.6 C) 99.5 F (37.5 C)  TempSrc:   Oral Oral  Resp: 15 16    Height:      Weight:      SpO2: 100% 100% 98% 98%   Weight change:   Intake/Output Summary (Last 24 hours) at 01/07/14 1555 Last data filed at 01/07/14 1300  Gross per 24 hour  Intake    100 ml  Output    600 ml  Net   -500 ml  Vital signs reviewed  Gen: Lying in bed in NAD, following commands appropriately  HEENT: no scleral icterus, mild TTP of bilateral anterior cervical nodes but no lymphadenopathy, oropharynx with increased erythema but no tonsil exudates.  CV: RRR, no murmurs, rubs, or gallops. Normal S1 and S2. No appreciable JVD.  Resp: CTAB, normal respiratory effort  Abd: mildly distended, soft, bowel sounds present, no caput medusa  Ext: no peripheral edema, dry skin, no teleangiectasia. Splinter hemorrhage in left 5th finger Neuro: Alert and oriented x3, no slurred speech, CN II-XII intact, no asterixis  Lab Results: Basic Metabolic Panel:  Recent Labs Lab 01/06/14 0805 01/07/14 0216  NA 139 136*  K 3.6* 3.5*  CL 106 103  CO2 17* 16*  GLUCOSE 124* 166*  BUN 54* 61*  CREATININE 2.70* 3.44*  CALCIUM 7.1* 6.9*   Liver Function Tests:  Recent Labs Lab 01/06/14 0805 01/07/14 0216  AST 54* 45*  ALT 48* 37*  ALKPHOS 407* 350*  BILITOT 4.5* 5.5*  PROT 5.2* 5.3*  ALBUMIN 1.8* 1.7*    Recent Labs Lab 01/03/14 2115  LIPASE 116*    Recent Labs Lab 01/04/14 0100 01/04/14 0730  AMMONIA 127* 161*   CBC:  Recent Labs Lab 01/06/14 0805 01/07/14 0216  WBC 11.4* 12.1*    NEUTROABS 9.6* 10.2*  HGB 8.4* 7.8*  HCT 25.2* 23.1*  MCV 107.2* 104.5*  PLT 148* 133*   CBG:  Recent Labs Lab 01/06/14 1145 01/06/14 1630 01/06/14 2055 01/06/14 2351 01/07/14 0755 01/07/14 1243  GLUCAP 306* 240* 270* 196* 152* 134*    Coagulation:  Recent Labs Lab 01/04/14 0730  LABPROT 12.4  INR 0.92   Anemia Panel:  Recent Labs Lab 01/04/14 0730  VITAMINB12 564  FERRITIN 611*  TIBC 180*  IRON 90   Urine Drug Screen: Drugs of Abuse     Component Value Date/Time   LABOPIA NONE DETECTED 01/03/2014 2311   LABOPIA NEG 11/13/2013 1516   COCAINSCRNUR NONE DETECTED 01/03/2014 2311   COCAINSCRNUR NEG 11/13/2013 1516   LABBENZ NONE DETECTED 01/03/2014 2311   LABBENZ NEG 11/13/2013 1516   AMPHETMU NONE DETECTED 01/03/2014 2311   THCU NONE DETECTED 01/03/2014 2311   LABBARB NONE DETECTED 01/03/2014 2311   LABBARB NEG 11/13/2013 1516    Urinalysis:  Recent Labs Lab 01/03/14 2311 01/06/14 1141  COLORURINE YELLOW ORANGE*  LABSPEC 1.010 1.021  PHURINE 6.0 5.5  GLUCOSEU 100* 100*  HGBUR NEGATIVE NEGATIVE  BILIRUBINUR NEGATIVE LARGE*  KETONESUR NEGATIVE NEGATIVE  PROTEINUR >300* >300*  UROBILINOGEN 1.0 1.0  NITRITE NEGATIVE NEGATIVE  LEUKOCYTESUR  NEGATIVE TRACE*    Micro Results: Recent Results (from the past 240 hour(s))  MRSA PCR SCREENING     Status: Abnormal   Collection Time    01/04/14  4:35 AM      Result Value Ref Range Status   MRSA by PCR POSITIVE (*) NEGATIVE Final   Comment:            The GeneXpert MRSA Assay (FDA     approved for NASAL specimens     only), is one component of a     comprehensive MRSA colonization     surveillance program. It is not     intended to diagnose MRSA     infection nor to guide or     monitor treatment for     MRSA infections.     RESULT CALLED TO, READ BACK BY AND VERIFIED WITH:     R.ZELLNER,RN 1610 01/04/14 M.CAMPBELL  CULTURE, BLOOD (ROUTINE X 2)     Status: None   Collection Time    01/05/14  3:30 PM       Result Value Ref Range Status   Specimen Description BLOOD LEFT ARM   Final   Special Requests     Final   Value: BOTTLES DRAWN AEROBIC AND ANAEROBIC 10CC BLUE, 5CC RED   Culture  Setup Time     Final   Value: 01/05/2014 22:48     Performed at [facility]   Culture     Final   Value: STAPHYLOCOCCUS AUREUS     Note: RIFAMPIN AND GENTAMICIN SHOULD NOT BE USED AS SINGLE DRUGS FOR TREATMENT OF STAPH INFECTIONS.     Note: Gram Stain Report Called to,Read Back By and Verified With: Volanda Napoleon RN on 01/06/14 at 06:45 by Christie Nottingham     Performed at [facility]   Report Status PENDING   Incomplete  CULTURE, BLOOD (ROUTINE X 2)     Status: None   Collection Time    01/05/14  3:42 PM      Result Value Ref Range Status   Specimen Description BLOOD BLOOD LEFT FOREARM   Final   Special Requests BOTTLES DRAWN AEROBIC ONLY 5CC   Final   Culture  Setup Time     Final   Value: 01/05/2014 22:48     Performed at [facility]   Culture     Final   Value: STAPHYLOCOCCUS AUREUS     Note: Gram Stain Report Called to,Read Back By and Verified With: Volanda Napoleon RN on 01/06/14 at 06:45 by Christie Nottingham     Performed at [facility]   Report Status PENDING   Incomplete  CULTURE, BLOOD (ROUTINE X 2)     Status: None   Collection Time    01/06/14  3:35 PM      Result Value Ref Range Status   Specimen Description BLOOD LEFT ANTECUBITAL   Final   Special Requests     Final   Value: BOTTLES DRAWN AEROBIC AND ANAEROBIC 10CC AER 5CC ANA   Culture  Setup Time     Final   Value: 01/06/2014 19:02     Performed at [facility]   Culture     Final   Value:        BLOOD CULTURE RECEIVED NO GROWTH TO DATE CULTURE WILL BE HELD FOR 5 DAYS BEFORE ISSUING A FINAL NEGATIVE REPORT     Performed at [facility]   Report Status PENDING  Incomplete  CULTURE, BLOOD (ROUTINE X 2)     Status: None   Collection Time    01/06/14  3:45 PM      Result Value Ref Range Status    Specimen Description BLOOD LEFT HAND   Final   Special Requests     Final   Value: BOTTLES DRAWN AEROBIC AND ANAEROBIC 10CC AER 5CC ANA   Culture  Setup Time     Final   Value: 01/06/2014 19:00     Performed at [facility]   Culture     Final   Value:        BLOOD CULTURE RECEIVED NO GROWTH TO DATE CULTURE WILL BE HELD FOR 5 DAYS BEFORE ISSUING A FINAL NEGATIVE REPORT     Performed at [facility]   Report Status PENDING   Incomplete   Studies/Results: Dg Chest 2 View  01/05/2014   CLINICAL DATA:  Fever, cough, chest pain and shortness of breath.  EXAM: CHEST  2 VIEW  COMPARISON:  Chest x-[place] 12/14/2013.  FINDINGS: Linear opacity in the lower left lung is unchanged, compatible with an area of mild scarring. Right internal jugular single-lumen power porta cath with tip terminating in the distal superior vena cava is unchanged. Lung volumes are low. No consolidative airspace disease. No pleural effusions. No pneumothorax. No pulmonary nodule or mass noted. Pulmonary vasculature and the cardiomediastinal silhouette are within normal limits.  IMPRESSION: 1. Low lung volumes without radiographic evidence of acute cardiopulmonary disease. 2. Mild scarring in the inferior aspect of the left lung, similar prior examinations.   Electronically Signed   By: Trudie Reed M.D.   On: 01/05/2014 19:15   Ir Removal Tun Access W/ Port W/o Fl Mod Sed  01/07/2014   CLINICAL DATA:  Infected right internal jugular vein Port-A-Cath.  EXAM: REMOVAL RIGHT IJ VEIN PORT-A-CATH  PROCEDURE: The right chest was prepped and draped in a sterile fashion. Lidocaine was utilized for local anesthesia. An incision was made over the previously healed surgical incision. Utilizing blunt dissection, the port catheter and reservoir were removed from the underlying subcutaneous tissue in their entirety. Securing sutures were also removed. The pocket was irrigated with a copious amount of sterile normal saline. Iodoform  gauze was packed into the surgical bed. A sterile dressing was applied.  IMPRESSION: Successful removal of a Port-A-Cath for infection. The pocket was packed with iodoform which will need to be retracted an inch every day. The wound should hopefully close by secondary intention.   Electronically Signed   By: Maryclare Bean M.D.   On: 01/07/2014 12:02   Medications: I have reviewed the patient's current medications. Scheduled Meds: . abacavir  300 mg Oral BID  . acyclovir  800 mg Oral Daily  . aspirin EC  81 mg Oral Daily  .  ceFAZolin (ANCEF) IV  2 g Intravenous Q12H  . Chlorhexidine Gluconate Cloth  6 each Topical Q0600  . Darunavir Ethanolate  800 mg Oral Q breakfast  . [START ON 01/08/2014] enoxaparin (LOVENOX) injection  30 mg Subcutaneous Q24H  . fentaNYL      . hydrocerin   Topical BID  . insulin aspart  0-9 Units Subcutaneous 6 times per day  . insulin glargine  20 Units Subcutaneous QHS  . lacosamide  100 mg Oral BID  . lactulose  20 g Oral TID  . [START ON 01/08/2014] lamiVUDine  50 mg Oral Daily  . levETIRAcetam  500 mg Oral BID  . lidocaine      .  magic mouthwash w/lidocaine  5 mL Oral TID  . midazolam      . mupirocin ointment  1 application Nasal BID  . pantoprazole  40 mg Oral Daily  . ritonavir  100 mg Oral Q breakfast  . sodium bicarbonate  650 mg Oral BID  . sodium chloride  3 mL Intravenous Q12H  . 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 She was febrile again overnight with Tmax of 102.75F.  BC from 8/9 grew Staphylococcus aurerus 2/2 vials in less than 24 hours which is concerning for Staph (including MRSA) bacteremia. She was started on Vancomycin treatment on 8/10 morning. The source of her bacteremia is unclear at this time but her port-a-cath placed on 12/05/13 is a possible and likely source. SBP less likely given minimum ascites, no abdominal pain, no changes in mental status. No signs of endocarditis on exam with no new murmur, no splinter hemorrhage. Port-a-cath was removed today.  -Transfer back to telemetry -Continue vancomycin per pharmacy  -Continue cefazolin  -TEE on 8/12  -Continue monitoring for s/s of SBP  -follow up on BC susceptibility--should be back tomorrow per micro bench report  -Continue holding antihypertensives for now  Acute on Chronic Renal Failure : Cr rose today to 3.44 from 2.70 yesterday. This is likely multifactorial from NPO state with no IVF, furosemide use, ACEi use, and possibly Vancomycin use.  -Strict in and outs -Hold Lasix and ACEi -Pharmacy renal dosing of vanc -Fe Na study -Repeat UA  Hepatic Encephalopathy: Resolved. She arrived lethargic which was concerning for acute hepatic encephalopathy. Ammonia level on admission was 127 and increased to 161, but she is back to her baseline mental status today.  -Continue home lactulose PO PRN titrated to 3BM/day  -Diet heart healthy/ Carb mod only if fully alert   Ascites: Soft and obese abdomen, only mildly distended and not tense. CT abdomen/pelvis on presentation with only mild ascities, no acute intraabdominal process. She denies increased abdominal distention and does not appear volume overloaded. She does not complain of SOB or dyspnea with improvement of her altered mental status. Not concerned for SBP at this time. Bilirubin 5.5 today.  -Will consider diagnostic/therapeutic tap of ascitic  fluid if she declines  -CMP in am  -Will consider abdominal US   History of seizure: Patient takes Dilantin at home which can be sedating. This may have precipitated her somnolence, as her corrected level per pharmacy is 25. Pharmacy on board and recommend decreasing dilantin and getting repeat level after ~5 days. Also mention the possible need to adjust HIV medications due to concern for interaction.  -Discontinue Dilantin  -Continue vimpat  -Continue Keppra   Hypokalemia: Improved. K is 3.5 today. Will continue monitoring and provide gentle repletion given her CKD4 if K is at or <3.0.   Hepatitis C: Genotype in December 2014. Genotype 1b, viral load of [id number], Log of 6.63.  -She will f/u with Dr. Drue Second in  [place] for possible treatment of Hep C   HIV disease: Well controlled with viral load undectable in 09/08/13. CD4 count in April at 280. Will recheck labs as dilantin level has been super therapeutic and can interact with antiretrovirals  -Repeat CD4  -Recheck viral load  -Continue home abacavir, prezista, norvir, and epivir  -Continue acyclovir for prophylaxis   HTN: BP mildly elevated initially but improved after home antihypertensives were given.  -continue home clonidine 0.3mg  BID, hydralazine 50mg  TID, labetalol 200mg  BID, lisinopril 40mg  daily.  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 Secondary to Hepatitis C infection. 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 Platelets and INR stable.   DMII: Last Hg A1C 7.5% on 11/13/13. On Lantus 30 u qHS and Novolog 10-25 u actid at home.  -Lantus 20 units qHS  -Sensitive SSI   Chronic Anemia: Likely anemia of chronic disease. Her baseline Hg is ~8. Two weeks ago her hemoglobin was lower around 6.7; since then it has been steadily increasing. Today her HbG is 7.9.   DVT prophylaxis: SCDs   Diet:  HH, then NPO after mdnt on 8/11 for TEE on 8/12   Dispo: Disposition is deferred  at this time, awaiting improvement of current medical problems. Anticipated discharge in approximately 2-3 day(s).   The patient does have a current PCP Christen Bame, MD) and does need an [facility] hospital follow-up appointment after discharge.  The patient does have transportation limitations that hinder transportation to clinic appointments.  .Services Needed at time of discharge: Y = Yes, Blank = No PT:   OT:   RN:   Equipment:   Other:     LOS: 4 days   Michelle Barban, MD 01/07/2014, 3:55 PM

## 2014-01-07 NOTE — Progress Notes (Signed)
  I have seen and examined the patient, and reviewed the daily progress note by Lillia Carmel, MS 4 and discussed the care of the patient with them. Please see my progress note from 01/07/2014 for further details regarding assessment and plan.    Signed:  Ky Barban, MD 01/07/2014, 3:55 PM

## 2014-01-07 NOTE — Progress Notes (Signed)
[facility]  Patient Status: Active (receiving services up to time of hospitalization)  [facility] is providing the following services: RN and HHA  If patient discharges after hours, please call [phone].   Michelle [name] 01/07/2014, 10:01 AM

## 2014-01-07 NOTE — Progress Notes (Signed)
TEE scheduled for 08/12 at 3:00 pm with Dr Eden Emms

## 2014-01-07 NOTE — Procedures (Signed)
RIJV PAC removal Packed with iodoform No comp

## 2014-01-07 NOTE — Progress Notes (Signed)
Subjective: Michelle [name]'s BP dropped yesterday to 98/51, so her anti-hypertensives were held. Her BP subsequently increased to 138/68. She also had a fever (102.4) around 3AM. She was conversant and at baseline mental status after she had her portacath removed this morning. She endorses having an appetite and denies n/v, chest pain, SOB, and chills.   Objective: Vital signs in last 24 hours: Filed Vitals:   01/07/14 0925 01/07/14 0930 01/07/14 0959 01/07/14 1244  BP: 130/62 132/65 137/68 168/75  Pulse: 84 85 87 95  Temp:   99.6 F (37.6 C) 99.5 F (37.5 C)  TempSrc:   Oral Oral  Resp: 15 16    Height:      Weight:      SpO2: 100% 100% 98% 98%   Weight change:   Intake/Output Summary (Last 24 hours) at 01/07/14 1337 Last data filed at 01/07/14 1300  Gross per 24 hour  Intake    100 ml  Output    600 ml  Net   -500 ml   Physical Exam:  Gen: Laying in bed resting in NAD HEENT: no scleral icterus, moist mucous membranes CV: No JVD, RRR, no murmurs, rubs, or gallops  Resp: CTAB, normal respiratory effort  Abd: soft, mildly distended with bowel sounds present, no caput medusa, no abdominal pain  Ext: no peripheral edema, no asterixis, splinter hemorrhage on left 5th digit nailbed visible with illumination Skin: R portacath removed, site appears non-erythematous Neuro: Alert and oriented x3, no slurred speech  Lab Results: Basic Metabolic Panel:  Recent Labs Lab 01/06/14 0805 01/07/14 0216  NA 139 136*  K 3.6* 3.5*  CL 106 103  CO2 17* 16*  GLUCOSE 124* 166*  BUN 54* 61*  CREATININE 2.70* 3.44*  CALCIUM 7.1* 6.9*   Liver Function Tests:  Recent Labs Lab 01/06/14 0805 01/07/14 0216  AST 54* 45*  ALT 48* 37*  ALKPHOS 407* 350*  BILITOT 4.5* 5.5*  PROT 5.2* 5.3*  ALBUMIN 1.8* 1.7*    Recent Labs Lab 01/03/14 2115  LIPASE 116*    Recent Labs Lab 01/04/14 0100 01/04/14 0730  AMMONIA 127* 161*   CBC:  Recent Labs Lab 01/06/14 0805 01/07/14 0216    WBC 11.4* 12.1*  NEUTROABS 9.6* 10.2*  HGB 8.4* 7.8*  HCT 25.2* 23.1*  MCV 107.2* 104.5*  PLT 148* 133*   CBG:  Recent Labs Lab 01/06/14 1145 01/06/14 1630 01/06/14 2055 01/06/14 2351 01/07/14 0755 01/07/14 1243  GLUCAP 306* 240* 270* 196* 152* 134*   Coagulation:  Recent Labs Lab 01/04/14 0730  LABPROT 12.4  INR 0.92   Anemia Panel:  Recent Labs Lab 01/04/14 0730  VITAMINB12 564  FERRITIN 611*  TIBC 180*  IRON 90   Urine Drug Screen: Drugs of Abuse     Component Value Date/Time   LABOPIA NONE DETECTED 01/03/2014 2311   LABOPIA NEG 11/13/2013 1516   COCAINSCRNUR NONE DETECTED 01/03/2014 2311   COCAINSCRNUR NEG 11/13/2013 1516   LABBENZ NONE DETECTED 01/03/2014 2311   LABBENZ NEG 11/13/2013 1516   AMPHETMU NONE DETECTED 01/03/2014 2311   THCU NONE DETECTED 01/03/2014 2311   LABBARB NONE DETECTED 01/03/2014 2311   LABBARB NEG 11/13/2013 1516    Urinalysis:  Recent Labs Lab 01/03/14 2311 01/06/14 1141  COLORURINE YELLOW ORANGE*  LABSPEC 1.010 1.021  PHURINE 6.0 5.5  GLUCOSEU 100* 100*  HGBUR NEGATIVE NEGATIVE  BILIRUBINUR NEGATIVE LARGE*  KETONESUR NEGATIVE NEGATIVE  PROTEINUR >300* >300*  UROBILINOGEN 1.0 1.0  NITRITE NEGATIVE NEGATIVE  LEUKOCYTESUR NEGATIVE TRACE*   Micro Results: Recent Results (from the past 240 hour(s))  MRSA PCR SCREENING     Status: Abnormal   Collection Time    01/04/14  4:35 AM      Result Value Ref Range Status   MRSA by PCR POSITIVE (*) NEGATIVE Final   Comment:            The GeneXpert MRSA Assay (FDA     approved for NASAL specimens     only), is one component of a     comprehensive MRSA colonization     surveillance program. It is not     intended to diagnose MRSA     infection nor to guide or     monitor treatment for     MRSA infections.     RESULT CALLED TO, READ BACK BY AND VERIFIED WITH:     R.ZELLNER,RN 1610 01/04/14 M.CAMPBELL  CULTURE, BLOOD (ROUTINE X 2)     Status: None   Collection Time    01/05/14   3:30 PM      Result Value Ref Range Status   Specimen Description BLOOD LEFT ARM   Final   Special Requests     Final   Value: BOTTLES DRAWN AEROBIC AND ANAEROBIC 10CC BLUE, 5CC RED   Culture  Setup Time     Final   Value: 01/05/2014 22:48     Performed at [facility]   Culture     Final   Value: STAPHYLOCOCCUS AUREUS     Note: RIFAMPIN AND GENTAMICIN SHOULD NOT BE USED AS SINGLE DRUGS FOR TREATMENT OF STAPH INFECTIONS.     Note: Gram Stain Report Called to,Read Back By and Verified With: Volanda Napoleon RN on 01/06/14 at 06:45 by Christie Nottingham     Performed at [facility]   Report Status PENDING   Incomplete  CULTURE, BLOOD (ROUTINE X 2)     Status: None   Collection Time    01/05/14  3:42 PM      Result Value Ref Range Status   Specimen Description BLOOD BLOOD LEFT FOREARM   Final   Special Requests BOTTLES DRAWN AEROBIC ONLY 5CC   Final   Culture  Setup Time     Final   Value: 01/05/2014 22:48     Performed at [facility]   Culture     Final   Value: STAPHYLOCOCCUS AUREUS     Note: Gram Stain Report Called to,Read Back By and Verified With: Volanda Napoleon RN on 01/06/14 at 06:45 by Christie Nottingham     Performed at [facility]   Report Status PENDING   Incomplete  CULTURE, BLOOD (ROUTINE X 2)     Status: None   Collection Time    01/06/14  3:35 PM      Result Value Ref Range Status   Specimen Description BLOOD LEFT ANTECUBITAL   Final   Special Requests     Final   Value: BOTTLES DRAWN AEROBIC AND ANAEROBIC 10CC AER 5CC ANA   Culture  Setup Time     Final   Value: 01/06/2014 19:02     Performed at [facility]   Culture     Final   Value:        BLOOD CULTURE RECEIVED NO GROWTH TO DATE CULTURE WILL BE HELD FOR 5 DAYS BEFORE ISSUING A FINAL NEGATIVE REPORT     Performed at [facility]   Report Status PENDING  Incomplete  CULTURE, BLOOD (ROUTINE X 2)     Status: None   Collection Time    01/06/14  3:45 PM      Result Value Ref  Range Status   Specimen Description BLOOD LEFT HAND   Final   Special Requests     Final   Value: BOTTLES DRAWN AEROBIC AND ANAEROBIC 10CC AER 5CC ANA   Culture  Setup Time     Final   Value: 01/06/2014 19:00     Performed at [facility]   Culture     Final   Value:        BLOOD CULTURE RECEIVED NO GROWTH TO DATE CULTURE WILL BE HELD FOR 5 DAYS BEFORE ISSUING A FINAL NEGATIVE REPORT     Performed at [facility]   Report Status PENDING   Incomplete   Studies/Results: Dg Chest 2 View  01/05/2014   CLINICAL DATA:  Fever, cough, chest pain and shortness of breath.  EXAM: CHEST  2 VIEW  COMPARISON:  Chest x-Smigel 12/14/2013.  FINDINGS: Linear opacity in the lower left lung is unchanged, compatible with an area of mild scarring. Right internal jugular single-lumen power porta cath with tip terminating in the distal superior vena cava is unchanged. Lung volumes are low. No consolidative airspace disease. No pleural effusions. No pneumothorax. No pulmonary nodule or mass noted. Pulmonary vasculature and the cardiomediastinal silhouette are within normal limits.  IMPRESSION: 1. Low lung volumes without radiographic evidence of acute cardiopulmonary disease. 2. Mild scarring in the inferior aspect of the left lung, similar prior examinations.   Electronically Signed   By: Trudie Reed M.D.   On: 01/05/2014 19:15   Ir Removal Tun Access W/ Port W/o Fl Mod Sed  01/07/2014   CLINICAL DATA:  Infected right internal jugular vein Port-A-Cath.  EXAM: REMOVAL RIGHT IJ VEIN PORT-A-CATH  PROCEDURE: The right chest was prepped and draped in a sterile fashion. Lidocaine was utilized for local anesthesia. An incision was made over the previously healed surgical incision. Utilizing blunt dissection, the port catheter and reservoir were removed from the underlying subcutaneous tissue in their entirety. Securing sutures were also removed. The pocket was irrigated with a copious amount of sterile normal  saline. Iodoform gauze was packed into the surgical bed. A sterile dressing was applied.  IMPRESSION: Successful removal of a Port-A-Cath for infection. The pocket was packed with iodoform which will need to be retracted an inch every day. The wound should hopefully close by secondary intention.   Electronically Signed   By: Maryclare Bean M.D.   On: 01/07/2014 12:02   Medications: I have reviewed the patient's current medications. Scheduled Meds: . abacavir  300 mg Oral BID  . acyclovir  800 mg Oral Daily  . aspirin EC  81 mg Oral Daily  .  ceFAZolin (ANCEF) IV  2 g Intravenous Q12H  . Chlorhexidine Gluconate Cloth  6 each Topical Q0600  . Darunavir Ethanolate  800 mg Oral Q breakfast  . [START ON 01/08/2014] enoxaparin (LOVENOX) injection  30 mg Subcutaneous Q24H  . fentaNYL      . hydrocerin   Topical BID  . insulin aspart  0-9 Units Subcutaneous 6 times per day  . insulin glargine  20 Units Subcutaneous QHS  . lacosamide  100 mg Oral BID  . lactulose  20 g Oral TID  . lamiVUDine  100 mg Oral Daily  . levETIRAcetam  500 mg Oral BID  . lidocaine      .  magic mouthwash w/lidocaine  5 mL Oral TID  . midazolam      . mupirocin ointment  1 application Nasal BID  . pantoprazole  40 mg Oral Daily  . ritonavir  100 mg Oral Q breakfast  . sodium bicarbonate  650 mg Oral BID  . sodium chloride  3 mL Intravenous Q12H  . [START ON 01/09/2014] vancomycin  750 mg Intravenous Q48H   Continuous Infusions:  PRN Meds:.hydrOXYzine, menthol-cetylpyridinium, ondansetron (ZOFRAN) IV, ondansetron Assessment/Plan: Principal Problem:   Sepsis, Gram positive Active Problems:   HIV disease   CKD (chronic kidney disease) stage 4, GFR 15-29 ml/min   Diabetes   HCV (hepatitis C virus)   Anemia   Metabolic acidosis, increased anion gap   Altered mental state   Hepatic encephalopathy   Ascites   Fever, unspecified  A: Ms. Tomassetti is a 53 year old woman with PMH significant for HIV and Hep C coinfection  complicated by cirrhotic liver, CKD stage IV, stroke, CHF, DMII who was admitted due to lethargy from hepatic encephalopathy. Encephalopathy has  resolved, but patient spiked a fever to 102.9 and was found to have staph aureus bacteremia.   Sepsis due to Staph Aureus bacteremia: She had a fever of 102.49F on 8/9 with mild tachycardia (HR: 105). She has remained at baseline mental status. Patient had a fever of 102.4 early this morning, but this has been trending down since the removal of her portacath, which is the likely source of infection. BP was 98/51 last night but after holding anti-hypertensive meds, her BP has been normotensive/hypertensive. She was started on Vancomycin and cefazolin treatment on 8/10. SBP less likely given minimum ascites, no abdominal pain, no changes in mental status. Endocarditis not likely on exam with no new murmur, but may have splinter hemorrhage on left 5th digit nail bed.  -Transfer patient from stepdown to floor  -Continue vancomycin per pharmacy  -Continue cefazolin per pharmacy -NPO after midnight for TEE on 8/12  -Continue monitoring for s/s of SBP  -Follow up on BC staph aureus resistance  -Will continue to hold her antihypertensive medications  Acute on CKD, stage 4: Cr bumped to 3.44 today from 2.7 yesterday. This is likely due to nephrotoxicity of the vancomycin and dilantin. Dilantin was d/ced on 8/9 but his corrected level is at 14.7 per pharmacy. Will not restart dilantin.  Lisinopril discontinued as well.  -CMP pending  Hepatic Encephalopathy: Resolved. She arrived lethargic which was concerning for acute hepatic encephalopathy. Ammonia level on admission was 127 and increased to 161, but she has been at her baseline mental status for 2 days. -Continue home lactulose PO PRN titrated to 3BM/day  -Diet heart healthy/ Carb mod only if fully alert   Ascites: Soft and obese abdomen, only mildly distended and not tense. CT abdomen/pelvis on presentation with  only mild ascities, no acute intraabdominal process. She denies increased abdominal distention and does not appear volume overloaded. She does not complain of SOB or dyspnea. She has been at baseline mental status for 2 days. Not concerned for SBP at this time. -Will consider diagnostic/therapeutic tap of ascitic fluid if clinical picture does not improve -CMP in am   History of seizure: Patient takes Dilantin at home and could have precipitated her somnolence, as her corrected level per pharmacy on admission was 25. Dilantin was discontinued on 8/8 and phenytoin level corrected is 14.7 today. -Will continue to hold Dilantin  -Vimpat 100mg  BID  -Continue Keppra which has been renally dosed  at 500mg   Hypokalemia: Improved. K is 3.5 today. Will continue monitoring and provide gentle repletion given her CKD4 if K is at or <3.0.   Hepatitis C: Genotype in December 2014. Genotype 1b, viral load of 4276200, Log of 6.63.  -She will f/u with Dr. Drue SecondSnider in ID for possible treatment of Hep C   HIV disease: Well controlled with viral load undectable in 09/08/13. CD4 count in April at 280. Will recheck labs as dilantin level has been super therapeutic and can interact with antiretrovirals  -Repeat CD4  -Recheck viral load  -Continue home abacavir, prezista, norvir, and epivir  -Continue acyclovir for prophylaxis   HTN: BP mildly elevated today after holding antihypertensives last night due to BP being 98/51 last night. Her BP has been normotensive/hypertensive since and her current BP is 162/71.  -Holding home hydralazine 50mg  TID, labetalol 200mg  BID, lisinopril 40mg  daily. -Parameters on clonidine 0.3mg  BID -Holding Lasix due to bump in creatinine and bacteremia.   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 Secondary to Hepatitis C infection. 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 Platelets and INR stable.   DMII: Last Hg A1C 7.5% on 11/13/13. On Lantus 30 u qHS and Novolog 10-25 u actid at home.    -Lantus 20 units qHS  -Sensitive SSI   Chronic Anemia: Likely anemia of chronic disease. Her baseline Hg is ~8. Two weeks ago her hemoglobin was lower around 6.7; since then it has been steadily increasing. Today her HbG is 7.8.   DVT prophylaxis: SCDs   Diet:  NPO after midnight for TEE tomorrowl, then HH/Carb mod  Dispo: Disposition is deferred at this time, awaiting improvement of current medical problems.    This is a Psychologist, occupationalMedical Student Note.  The care of the patient was discussed with Dr. Garald BraverKennerly and the assessment and plan formulated with their assistance.  Please see their attached note for official documentation of the daily encounter.   LOS: 4 days   Lillia CarmelNida Elvina Bosch, Med Student 01/07/2014, 1:37 PM

## 2014-01-07 NOTE — Care Management Note (Addendum)
    Page 1 of 2   01/15/2014     2:45:09 PM CARE MANAGEMENT NOTE 01/15/2014  Patient:  Michelle [name], Michelle [name]   Account Number:  [id number]  Date Initiated:  01/07/2014  Documentation initiated by:  Junius Creamer  Subjective/Objective Assessment:   adm w sirs  patient is active with [facility] for [facility] and aide.     Action/Plan:   lives w fam, pcpdr nora sadek, act w [facility]  pt rec  hhpt- but patient will need snf for iv abx.   Anticipated DC Date:  01/15/2014   Anticipated DC Plan:  SKILLED NURSING FACILITY  In-house referral  Clinical Social Worker      DC Planning Services  CM consult      [facility] Choice  Resumption Of Svcs/PTA Provider   Choice offered to / List presented to:  C-1 Patient           Status of service:  Completed, signed off Medicare Important Message given?  NO (If response is "NO", the following Medicare IM given date fields will be blank) Date Medicare IM given:   Medicare IM given by:   Date Additional Medicare IM given:   Additional Medicare IM given by:    Discharge Disposition:  SKILLED NURSING FACILITY  Per UR Regulation:  Reviewed for med. necessity/level of care/duration of stay  If discussed at Long Length of Stay Meetings, dates discussed:    Comments:  01/15/14 1444 [name] RN, BSN [phone] patient is for dc today to snf, notified [facility].  01/14/14 1655 [name] RN, BSN [phone] patient received picc line today, for dc to snf tomorrow.  01/10/14 1240 [name] RN, BSN [phone] per MD patient will be on iv abx bid ( ANCEF) and will need to go to snf, NCM spoke with patient and she states she will need to go to snf as well because she does not have responsible support at home to get her meds to her on time and if there was an emergency they would not get her help in time.  CSW aware, MD states patient will get picc line tomorrow and will be ready tomorrow after picc line.  01/09/14 1551 [name] RN, BSN [phone] patient is active with [facility]  for [facility] and aide,  patient states she would like to continue  with [facility] ,  Lupita Leash notified.  Patient may be going home with IV ABX at dc as well.

## 2014-01-07 NOTE — Progress Notes (Signed)
A.M. Assessment reviewed and unchanged. 01/07/2014 1:23 PM Eileen Stanford

## 2014-01-07 NOTE — Progress Notes (Signed)
  Date: 01/07/2014  Patient name: Michelle [name]  Medical record number: [id number]  Date of birth: 16-Aug-1960   This patient has been seen and the plan of care was discussed with the house staff. Please see their note for complete details. I concur with their findings with the following additions/corrections:  Ms. Demain is a 53yo F with HIV-HCV co infection, well controlled HIV disease but also has CKD and HCV-complicated cirrhosis, presented with hepatic encephalopathy found to have S.aureus bacteremia.  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 Has +splinter hemorrhage on physical exam. Appears to be improving with IVF, antibiotics, and removal of port. Thus far no longer having fever as of 8 am. Receiving some IVF due to worsening Acute on chronic kidney disease.       [place] [facility] Staphylococcus aureus bacteremia   Staphylococcus aureus bacteremia (SAB) is associated with a high rate of complications and mortality.  Specific aspects of clinical management are critical to optimizing the outcome of patients with SAB.  Therefore, the [facility] Antimicrobial Management Team [facility]) has initiated an intervention aimed at improving the management of SAB at [facility].  To do so, Infectious Diseases physicians are providing an evidence-based consult for the management of all patients with SAB.     Yes No Comments  Perform follow-up blood cultures (even if the patient is afebrile) to ensure clearance of bacteremia [x]  []  Awaiting follow up from 8/10 blood cx but may need to repeat on 8/12  Remove vascular catheter and obtain follow-up blood cultures after the removal of the catheter [x]  []  Port a cath removed on 8/11 am  Perform echocardiography to evaluate for endocarditis:  Scheduled for 8/12 []  [x]  Please keep in mind, that neither test can definitively EXCLUDE endocarditis, and that should clinical suspicion remain high for endocarditis the patient should then still be  treated with an "endocarditis" duration of therapy = 6 weeks       Ensure source control []  []  Have all abscesses been drained effectively? Have deep seeded infections (septic joints or osteomyelitis) had appropriate surgical debridement?  Investigate for "metastatic" sites of infection []  []  Does the patient have ANY symptom or physical exam finding that would suggest a deeper infection (back or neck pain that may be suggestive of vertebral osteomyelitis or epidural abscess, muscle pain that could be a symptom of pyomyositis)?  Keep in mind that for deep seeded infections MRI imaging with contrast is preferred rather than other often insensitive tests such as plain x-rays, especially early in a patient's presentation.  Change antibiotic therapy to vanco plus cefazolin until more information is obtained from TEE []  []  Beta-lactam antibiotics are preferred for MSSA due to higher cure rates.   If on Vancomycin, goal trough should be 15 - 20 mcg/mL  Estimated duration of IV antibiotic therapy:  6 wks until more information from TEE []  []  Consult case management for probably prolonged outpatient IV antibiotic therapy     Judyann Munson, MD 01/07/2014, 3:18 PM

## 2014-01-08 ENCOUNTER — Inpatient Hospital Stay (HOSPITAL_COMMUNITY): Payer: Medicaid Other

## 2014-01-08 ENCOUNTER — Encounter (HOSPITAL_COMMUNITY)
Admission: EM | Disposition: A | Discharge status: Skilled Nursing Facility | Payer: Self-pay | Source: Home / Self Care | Attending: Internal Medicine

## 2014-01-08 DIAGNOSIS — I359 Nonrheumatic aortic valve disorder, unspecified: Secondary | ICD-10-CM

## 2014-01-08 LAB — COMPREHENSIVE METABOLIC PANEL
ALT: 26 U/L (ref 0–35)
AST: 79 U/L — ABNORMAL HIGH (ref 0–37)
Albumin: 1.6 g/dL — ABNORMAL LOW (ref 3.5–5.2)
Alkaline Phosphatase: 447 U/L — ABNORMAL HIGH (ref 39–117)
Anion gap: 19 — ABNORMAL HIGH (ref 5–15)
BILIRUBIN TOTAL: 5.5 mg/dL — AB (ref 0.3–1.2)
BUN: 55 mg/dL — AB (ref 6–23)
CHLORIDE: 105 meq/L (ref 96–112)
CO2: 14 meq/L — AB (ref 19–32)
CREATININE: 3.01 mg/dL — AB (ref 0.50–1.10)
Calcium: 7.2 mg/dL — ABNORMAL LOW (ref 8.4–10.5)
GFR, EST AFRICAN AMERICAN: 19 mL/min — AB (ref >90)
GFR, EST NON AFRICAN AMERICAN: 17 mL/min — AB (ref >90)
GLUCOSE: 148 mg/dL — AB (ref 70–99)
Potassium: 3.3 mEq/L — ABNORMAL LOW (ref 3.7–5.3)
Sodium: 138 mEq/L (ref 137–147)
Total Protein: 5.6 g/dL — ABNORMAL LOW (ref 6.0–8.3)

## 2014-01-08 LAB — CBC WITH DIFFERENTIAL/PLATELET
BASOS ABS: 0 10*3/uL (ref 0.0–0.1)
Basophils Relative: 0 % (ref 0–1)
EOS PCT: 0 % (ref 0–5)
Eosinophils Absolute: 0 10*3/uL (ref 0.0–0.7)
HCT: 23.9 % — ABNORMAL LOW (ref 36.0–46.0)
Hemoglobin: 8 g/dL — ABNORMAL LOW (ref 12.0–15.0)
LYMPHS ABS: 1 10*3/uL (ref 0.7–4.0)
Lymphocytes Relative: 11 % — ABNORMAL LOW (ref 12–46)
MCH: 36.2 pg — ABNORMAL HIGH (ref 26.0–34.0)
MCHC: 33.5 g/dL (ref 30.0–36.0)
MCV: 108.1 fL — ABNORMAL HIGH (ref 78.0–100.0)
Monocytes Absolute: 1.1 10*3/uL — ABNORMAL HIGH (ref 0.1–1.0)
Monocytes Relative: 11 % (ref 3–12)
NEUTROS PCT: 78 % — AB (ref 43–77)
Neutro Abs: 7.3 10*3/uL (ref 1.7–7.7)
PLATELETS: 140 10*3/uL — AB (ref 150–400)
RBC: 2.21 MIL/uL — AB (ref 3.87–5.11)
RDW: 14.1 % (ref 11.5–15.5)
WBC: 9.4 10*3/uL (ref 4.0–10.5)

## 2014-01-08 LAB — CBC
HCT: 22.5 % — ABNORMAL LOW (ref 36.0–46.0)
HEMOGLOBIN: 7.5 g/dL — AB (ref 12.0–15.0)
MCH: 34.7 pg — AB (ref 26.0–34.0)
MCHC: 33.3 g/dL (ref 30.0–36.0)
MCV: 104.2 fL — AB (ref 78.0–100.0)
Platelets: 132 10*3/uL — ABNORMAL LOW (ref 150–400)
RBC: 2.16 MIL/uL — ABNORMAL LOW (ref 3.87–5.11)
RDW: 13.7 % (ref 11.5–15.5)
WBC: 10.8 10*3/uL — ABNORMAL HIGH (ref 4.0–10.5)

## 2014-01-08 LAB — GLUCOSE, CAPILLARY
GLUCOSE-CAPILLARY: 154 mg/dL — AB (ref 70–99)
Glucose-Capillary: 156 mg/dL — ABNORMAL HIGH (ref 70–99)
Glucose-Capillary: 107 mg/dL — ABNORMAL HIGH (ref 70–99)
Glucose-Capillary: 148 mg/dL — ABNORMAL HIGH (ref 70–99)
Glucose-Capillary: 207 mg/dL — ABNORMAL HIGH (ref 70–99)

## 2014-01-08 LAB — CULTURE, BLOOD (ROUTINE X 2)

## 2014-01-08 LAB — BILIRUBIN, FRACTIONATED(TOT/DIR/INDIR)
BILIRUBIN INDIRECT: 0.5 mg/dL (ref 0.3–0.9)
Bilirubin, Direct: 4.9 mg/dL — ABNORMAL HIGH (ref 0.0–0.3)
Total Bilirubin: 5.4 mg/dL — ABNORMAL HIGH (ref 0.3–1.2)

## 2014-01-08 LAB — PROTIME-INR
INR: 0.89 (ref 0.00–1.49)
Prothrombin Time: 12 seconds (ref 11.6–15.2)

## 2014-01-08 SURGERY — ECHOCARDIOGRAM, TRANSESOPHAGEAL
Anesthesia: Moderate Sedation

## 2014-01-08 MED ORDER — LABETALOL HCL 300 MG PO TABS
600.0000 mg | ORAL_TABLET | Freq: Two times a day (BID) | ORAL | Status: DC
  Administered 2014-01-08 – 2014-01-12 (×8): 600 mg via ORAL
  Filled 2014-01-08 (×9): qty 2

## 2014-01-08 MED ORDER — HYDRALAZINE HCL 50 MG PO TABS
50.0000 mg | ORAL_TABLET | Freq: Three times a day (TID) | ORAL | Status: DC
  Administered 2014-01-08 – 2014-01-12 (×11): 50 mg via ORAL
  Filled 2014-01-08 (×13): qty 1

## 2014-01-08 MED ORDER — HYDRALAZINE HCL 50 MG PO TABS
50.0000 mg | ORAL_TABLET | Freq: Three times a day (TID) | ORAL | Status: DC
  Filled 2014-01-08 (×2): qty 1

## 2014-01-08 MED ORDER — LORAZEPAM 2 MG/ML IJ SOLN
0.5000 mg | Freq: Once | INTRAMUSCULAR | Status: AC
  Administered 2014-01-08: 0.5 mg via INTRAVENOUS
  Filled 2014-01-08: qty 1

## 2014-01-08 MED ORDER — INSULIN ASPART 100 UNIT/ML ~~LOC~~ SOLN: 0.0000 [IU] | Freq: Once | SUBCUTANEOUS | Status: AC

## 2014-01-08 MED ORDER — LORAZEPAM 2 MG/ML IJ SOLN: 0.5000 mg | Freq: Once | INTRAMUSCULAR | Status: DC

## 2014-01-08 MED ORDER — LORAZEPAM BOLUS VIA INFUSION: 0.5000 mg | INTRAVENOUS | Status: DC | PRN

## 2014-01-08 MED ORDER — LEVETIRACETAM IN NACL 1000 MG/100ML IV SOLN
1000.0000 mg | Freq: Once | INTRAVENOUS | Status: AC
  Administered 2014-01-08: 1000 mg via INTRAVENOUS
  Filled 2014-01-08: qty 100

## 2014-01-08 MED ORDER — ACETAMINOPHEN 325 MG PO TABS
650.0000 mg | ORAL_TABLET | Freq: Four times a day (QID) | ORAL | Status: DC | PRN
  Administered 2014-01-08 – 2014-01-14 (×6): 650 mg via ORAL
  Filled 2014-01-08 (×7): qty 2

## 2014-01-08 MED ORDER — CLONIDINE HCL 0.3 MG PO TABS
0.3000 mg | ORAL_TABLET | Freq: Three times a day (TID) | ORAL | Status: DC
  Administered 2014-01-08 – 2014-01-12 (×11): 0.3 mg via ORAL
  Filled 2014-01-08 (×15): qty 1

## 2014-01-08 MED ORDER — LISINOPRIL 40 MG PO TABS
40.0000 mg | ORAL_TABLET | Freq: Every day | ORAL | Status: DC
  Administered 2014-01-08: 40 mg via ORAL
  Filled 2014-01-08 (×3): qty 1

## 2014-01-08 MED ORDER — LORAZEPAM 2 MG/ML IJ SOLN
0.5000 mg | INTRAMUSCULAR | Status: DC | PRN
  Administered 2014-01-08: 0.5 mg via INTRAVENOUS
  Filled 2014-01-08: qty 1

## 2014-01-08 MED ORDER — INSULIN ASPART 100 UNIT/ML ~~LOC~~ SOLN
0.0000 [IU] | Freq: Three times a day (TID) | SUBCUTANEOUS | Status: DC
  Administered 2014-01-09 (×2): 2 [IU] via SUBCUTANEOUS
  Administered 2014-01-09: 5 [IU] via SUBCUTANEOUS
  Administered 2014-01-10: 3 [IU] via SUBCUTANEOUS
  Administered 2014-01-10: 2 [IU] via SUBCUTANEOUS
  Administered 2014-01-10: 3 [IU] via SUBCUTANEOUS
  Administered 2014-01-11: 5 [IU] via SUBCUTANEOUS
  Administered 2014-01-11: 3 [IU] via SUBCUTANEOUS
  Administered 2014-01-11 – 2014-01-12 (×2): 5 [IU] via SUBCUTANEOUS
  Administered 2014-01-12: 3 [IU] via SUBCUTANEOUS
  Administered 2014-01-12 – 2014-01-14 (×6): 5 [IU] via SUBCUTANEOUS
  Administered 2014-01-14: 2 [IU] via SUBCUTANEOUS
  Administered 2014-01-15: 7 [IU] via SUBCUTANEOUS
  Administered 2014-01-15: 5 [IU] via SUBCUTANEOUS
  Administered 2014-01-15: 7 [IU] via SUBCUTANEOUS

## 2014-01-08 NOTE — Progress Notes (Signed)
Patient ID: Michelle [name], female   DOB: 10-17-1960, 53 y.o.   MRN: [id number] Examined patient prior to possible TEE MS in / out Liver faiure with Bilirubin over 5 Kidney Failure with Cr over 3 Anemic Hct 23  Discussed with primary service Will order TTE  Tentatively plan TEE Friday if improved multisystem failure  Charlton Haws

## 2014-01-08 NOTE — Progress Notes (Signed)
Physical Therapy Treatment Patient Details Name: Michelle [name] MRN: [id number] DOB: 09-28-1960 Today's Date: 01/08/2014    History of Present Illness Patient is a 53 y/o female admitted from home with increased lethargy for the last 24 hours and AMS. Diagnosed with hepatic encephalopathy and ascites. PMH positive for cirrhotic liver secondary to Hep C infection, HIV, CKD stage IV, CVA, DM II, seizures, HTN and chronic anemia.    PT Comments    Patient progressing slowly. Requires maximal encouragement and coaxing to participate in therapy. All mobility requires increased time as pt with impaired initiation and processing. Pt lethargic today and persistently requesting water when NPO for test. Requires constant stimulus and cues to stay on task. Pt is self limiting in therapy and very distracted. Will continue to follow and progress as tolerated.    Follow Up Recommendations  Home health PT;Supervision/Assistance - 24 hour     Equipment Recommendations  None recommended by PT    Recommendations for Other Services       Precautions / Restrictions Precautions Precautions: Fall Precaution Comments: Contact precautions. Restrictions Weight Bearing Restrictions: No    Mobility  Bed Mobility Overal bed mobility: Needs Assistance Bed Mobility: Sit to Supine;Sidelying to Sit   Sidelying to sit: HOB elevated;Min assist   Sit to supine: Min guard   General bed mobility comments: Increased time to perform bed mobility and transfers with constant verbal cues/demonstrational cues to initiate movement. VC for technique and hand placement. Use of rails for support.  Transfers Overall transfer level: Needs assistance Equipment used: Rolling walker (2 wheeled) Transfers: Sit to/from Stand Sit to Stand: Min guard         General transfer comment: Stood from EOB x1 with increased time and cues. Required Min A to scoot bottom to EOB. Pt with instances of "dazing out" during  session.  Ambulation/Gait Ambulation/Gait assistance: Min assist Ambulation Distance (Feet): 20 Feet Assistive device: Rolling walker (2 wheeled) Gait Pattern/deviations: Step-through pattern;Trunk flexed Gait velocity: Decreased   General Gait Details: Small, short steps with posterior trunk lean during gait. Unsteady. Min A for balance and RW management.   Stairs            Wheelchair Mobility    Modified Rankin (Stroke Patients Only)       Balance Overall balance assessment: Needs assistance Sitting-balance support: Single extremity supported;Feet supported   Sitting balance - Comments: Required assist of UEs for support to prevent LOB due to lethargy.     Standing balance-Leahy Scale: Poor Standing balance comment: Requires use of RW for support secondary to lethargy and poor standing balance. Min A for balance.                    Cognition Arousal/Alertness: Lethargic Behavior During Therapy: Flat affect Overall Cognitive Status: Impaired/Different from baseline   Orientation Level: Disoriented to;Situation;Time   Memory: Decreased short-term memory Following Commands: Follows one step commands inconsistently;Follows one step commands with increased time Safety/Judgement: Decreased awareness of safety   Problem Solving: Slow processing;Decreased initiation;Difficulty sequencing;Requires verbal cues General Comments: Required repetition to initiate any movement or task. Short term deficits noted within session. Does not finish sentences. Requires constant stimulus to follow commands. Increased time to perform all mobility and answer questions - incomplete answers.    Exercises General Exercises - Lower Extremity Ankle Circles/Pumps: Both;10 reps;Seated    General Comments        Pertinent Vitals/Pain Pain Assessment: No/denies pain    Home Living  Prior Function            PT Goals (current goals can now be  found in the care plan section) Progress towards PT goals: Progressing toward goals ([place].)    Frequency       PT Plan Current plan remains appropriate    Co-evaluation             End of Session Equipment Utilized During Treatment: Gait belt Activity Tolerance: Patient limited by lethargy Patient left: in bed;with call bell/phone within reach;with bed alarm set     Time: [id number] PT Time Calculation (min): 17 min  Charges:  $Therapeutic Activity: 8-22 mins                    G CodesAlvie Heidelberg:      Folan, Rjay Revolorio A 01/08/2014, 11:59 AM Alvie HeidelbergShauna Folan, PT, DPT [phone]

## 2014-01-08 NOTE — Progress Notes (Signed)
Agree.  Will continue to monitor wound care.

## 2014-01-08 NOTE — Progress Notes (Signed)
Subjective: Infected PAC removed 8/11 in IR Pt tolerated well Febrile this am (102.4) +BC and + anaerobe  Objective: Vital signs in last 24 hours: Temp:  [99.5 F (37.5 C)-102.4 F (39.1 C)] 100.6 F (38.1 C) (08/12 0725) Pulse Rate:  [95-111] 111 (08/12 0440) Resp:  [20] 20 (08/12 0440) BP: (156-168)/(75-77) 156/76 mmHg (08/12 0440) SpO2:  [97 %-98 %] 97 % (08/12 0440) Weight:  [78.1 kg (172 lb 2.9 oz)] 78.1 kg (172 lb 2.9 oz) (08/11 1900) Last BM Date: 01/07/14  Intake/Output from previous day: 08/11 0701 - 08/12 0700 In: 150 [IV Piggyback:150] Out: 600 [Urine:600] Intake/Output this shift:    PE:  100.6 now vss Site of PAC removal is clean and dry No redness; NT Iodoform gauze has been pulled back 1 inch as per order Wbc 9.4 (12.1) +BC; + anaerobe  Lab Results:   Recent Labs  01/07/14 0216 01/08/14 0656  WBC 12.1* 9.4  HGB 7.8* 8.0*  HCT 23.1* 23.9*  PLT 133* 140*   BMET  Recent Labs  01/07/14 0216 01/08/14 0656  NA 136* 138  K 3.5* 3.3*  CL 103 105  CO2 16* 14*  GLUCOSE 166* 148*  BUN 61* 55*  CREATININE 3.44* 3.01*  CALCIUM 6.9* 7.2*   PT/INR  Recent Labs  01/08/14 0925  LABPROT 12.0  INR 0.89   ABG No results found for this basename: PHART, PCO2, PO2, HCO3,  in the last 72 hours  Studies/Results: Ir Removal Gap Inc W/o Fl Mod Sed  01/07/2014   CLINICAL DATA:  Infected right internal jugular vein Port-A-Cath.  EXAM: REMOVAL RIGHT IJ VEIN PORT-A-CATH  PROCEDURE: The right chest was prepped and draped in a sterile fashion. Lidocaine was utilized for local anesthesia. An incision was made over the previously healed surgical incision. Utilizing blunt dissection, the port catheter and reservoir were removed from the underlying subcutaneous tissue in their entirety. Securing sutures were also removed. The pocket was irrigated with a copious amount of sterile normal saline. Iodoform gauze was packed into the surgical bed. A sterile  dressing was applied.  IMPRESSION: Successful removal of a Port-A-Cath for infection. The pocket was packed with iodoform which will need to be retracted an inch every day. The wound should hopefully close by secondary intention.   Electronically Signed   By: Maryclare Bean M.D.   On: 01/07/2014 12:02    Anti-infectives: Anti-infectives   Start     Dose/Rate Route Frequency Ordered Stop   01/09/14 0630  vancomycin (VANCOCIN) IVPB 750 mg/150 ml premix     750 mg 150 mL/hr over 60 Minutes Intravenous Every 48 hours 01/07/14 1418     01/08/14 1000  lamiVUDine (EPIVIR) 10 MG/ML solution 50 mg     50 mg Oral Daily 01/07/14 1517     01/07/14 0600  vancomycin (VANCOCIN) IVPB 750 mg/150 ml premix  Status:  Discontinued     750 mg 150 mL/hr over 60 Minutes Intravenous Every 24 hours 01/06/14 0701 01/07/14 1418   01/06/14 1600  ceFAZolin (ANCEF) IVPB 2 g/50 mL premix     2 g 100 mL/hr over 30 Minutes Intravenous Every 12 hours 01/06/14 1518     01/06/14 1500  lamiVUDine (EPIVIR) 10 MG/ML solution 100 mg  Status:  Discontinued     100 mg Oral Daily 01/06/14 1402 01/07/14 1517   01/06/14 0800  vancomycin (VANCOCIN) 1,250 mg in sodium chloride 0.9 % 250 mL IVPB     1,250 mg 166.7 mL/hr  over 90 Minutes Intravenous  Once 01/06/14 0701 01/06/14 1156   01/04/14 1000  lamiVUDine (EPIVIR) tablet 150 mg  Status:  Discontinued     150 mg Oral Daily 01/04/14 0414 01/06/14 1401   01/04/14 1000  abacavir (ZIAGEN) tablet 300 mg     300 mg Oral 2 times daily 01/04/14 0414     01/04/14 1000  acyclovir (ZOVIRAX) tablet 800 mg     800 mg Oral Daily 01/04/14 0414     01/04/14 0800  Darunavir Ethanolate (PREZISTA) tablet 800 mg     800 mg Oral Daily with breakfast 01/04/14 0414     01/04/14 0800  ritonavir (NORVIR) capsule 100 mg     100 mg Oral Daily with breakfast 01/04/14 0414        Assessment/Plan: s/p  Infected PAC removed 8/11 Will follow Order in for RN to pull back Iodoform gauze 1 inch daily and  redress This should continue to gauze is completely pulled from site - will need HHRN if necessary   LOS: 5 days    Beverly Ferner A 01/08/2014

## 2014-01-08 NOTE — Progress Notes (Addendum)
Called to room per pt floor Secretary at 2125 for pt having seizures. Upon my arrival at 2128 pt resting in the bed, awake, follows commands, anxious oriented to self, location,time yet disoriented to situation. This is her baseline per RN. Pt had a seizure witnessed per charge RN, lasting about 4 minutes with pt "starring off" and " jerking arms". Resident Dr.Sadek paged to bedside at 2140 to assess pt. Pt also HTN with bp 209/92, hr 120s ST, po2 94 % on 2 LNC. Per MD to write orders for BP tonight. Pt left in bed, denies pain resting quietly. RN to monitor closely.    Addendum: At 2149 called back to room per floor Secretary for pt having a seizure. This seizure witnessed per NT who saw pt "Jerking arms and legs". Pt was asked "Are you ok" during seizure and was able to answer "No, Im scarred" Resident at bedside. There is some uncertainty of whether pt is having seizures or pseudoseizures. Pt was incontinent after the seizure. 0.5mg  Ativan IV ordered and loading dose of keppra also ordered. Clonidine 0.3mg  po started tonight. At 2225 prior to leaving the pt I witnessed a "seizure" Pt jerking arms and legs, maintains eye contact and tracks me as I move about the room. Pt answers yes/no questions. Seizure stops upon applying noxious stimuli to right lower extremity. Once seizure stopped pt immediatly back to baseline,anxious, follows command, not post ictal by my assessment.  RN advised to page resident for anti anxiety med. RN to monitor closely, advised to call for further concerns.

## 2014-01-08 NOTE — Progress Notes (Signed)
Subjective: She had a fever overnight with Tmax of 102F. She has baseline mental status with complaint of being hungry after being NPO after midnight for TEE this morning.  She denies chest pain, shortness of breath, palpitations, or abdominal pain.    Objective: Vital signs in last 24 hours: Filed Vitals:   01/08/14 1200 01/08/14 1503 01/08/14 1804 01/08/14 1806  BP:  178/86 175/85 185/97  Pulse:  97  108  Temp: 100 F (37.8 C) 99.7 F (37.6 C)    TempSrc:  Oral    Resp:  20    Height:      Weight:      SpO2:  98%     Weight change:   Intake/Output Summary (Last 24 hours) at 01/08/14 1837 Last data filed at 01/08/14 1700  Gross per 24 hour  Intake    680 ml  Output      0 ml  Net    680 ml   Vital signs reviewed  Gen: Lying in bed in NAD, following commands appropriately  HEENT: no scleral icterus, mild TTP of bilateral anterior cervical nodes but no lymphadenopathy, oropharynx with increased erythema but no tonsil exudates.  CV: RRR, no murmurs, rubs, or gallops Resp: bibasilar crackles bilaterally, normal respiratory effort, no wheezing Abd: mildly distended, soft, bowel sounds present, no caput medusa, RUQ mildly TTP Ext: no peripheral edema, dry skin, no teleangiectasia. Splinter hemorrhage in left 5th finger  Neuro: Alert and oriented x3, no slurred speech, CN II-XII intact, no asterixis  Lab Results: Basic Metabolic Panel:  Recent Labs Lab 01/07/14 0216 01/08/14 0656  NA 136* 138  K 3.5* 3.3*  CL 103 105  CO2 16* 14*  GLUCOSE 166* 148*  BUN 61* 55*  CREATININE 3.44* 3.01*  CALCIUM 6.9* 7.2*   Liver Function Tests:  Recent Labs Lab 01/07/14 0216 01/08/14 0656 01/08/14 0925  AST 45* 79*  --   ALT 37* 26  --   ALKPHOS 350* 447*  --   BILITOT 5.5* 5.5* 5.4*  PROT 5.3* 5.6*  --   ALBUMIN 1.7* 1.6*  --     Recent Labs Lab 01/03/14 2115  LIPASE 116*    Recent Labs Lab 01/04/14 0100 01/04/14 0730  AMMONIA 127* 161*   CBC:  Recent  Labs Lab 01/07/14 0216 01/08/14 0656  WBC 12.1* 9.4  NEUTROABS 10.2* 7.3  HGB 7.8* 8.0*  HCT 23.1* 23.9*  MCV 104.5* 108.1*  PLT 133* 140*   CBG:  Recent Labs Lab 01/07/14 2155 01/07/14 2344 01/08/14 0348 01/08/14 0757 01/08/14 1134 01/08/14 1632  GLUCAP 179* 168* 156* 154* 107* 148*    Coagulation:  Recent Labs Lab 01/04/14 0730 01/08/14 0925  LABPROT 12.4 12.0  INR 0.92 0.89   Anemia Panel:  Recent Labs Lab 01/04/14 0730  VITAMINB12 564  FERRITIN 611*  TIBC 180*  IRON 90   Urine Drug Screen: Drugs of Abuse     Component Value Date/Time   LABOPIA NONE DETECTED 01/03/2014 2311   LABOPIA NEG 11/13/2013 1516   COCAINSCRNUR NONE DETECTED 01/03/2014 2311   COCAINSCRNUR NEG 11/13/2013 1516   LABBENZ NONE DETECTED 01/03/2014 2311   LABBENZ NEG 11/13/2013 1516   AMPHETMU NONE DETECTED 01/03/2014 2311   THCU NONE DETECTED 01/03/2014 2311   LABBARB NONE DETECTED 01/03/2014 2311   LABBARB NEG 11/13/2013 1516     Recent Labs Lab 01/03/14 2311 01/06/14 1141  COLORURINE YELLOW ORANGE*  LABSPEC 1.010 1.021  PHURINE 6.0 5.5  GLUCOSEU 100*  100*  HGBUR NEGATIVE NEGATIVE  BILIRUBINUR NEGATIVE LARGE*  KETONESUR NEGATIVE NEGATIVE  PROTEINUR >300* >300*  UROBILINOGEN 1.0 1.0  NITRITE NEGATIVE NEGATIVE  LEUKOCYTESUR NEGATIVE TRACE*    Micro Results: Recent Results (from the past 240 hour(s))  MRSA PCR SCREENING     Status: Abnormal   Collection Time    01/04/14  4:35 AM      Result Value Ref Range Status   MRSA by PCR POSITIVE (*) NEGATIVE Final   Comment:            The GeneXpert MRSA Assay (FDA     approved for NASAL specimens     only), is one component of a     comprehensive MRSA colonization     surveillance program. It is not     intended to diagnose MRSA     infection nor to guide or     monitor treatment for     MRSA infections.     RESULT CALLED TO, READ BACK BY AND VERIFIED WITH:     R.ZELLNER,RN 1610 01/04/14 M.CAMPBELL  CULTURE, BLOOD (ROUTINE X 2)      Status: None   Collection Time    01/05/14  3:30 PM      Result Value Ref Range Status   Specimen Description BLOOD LEFT ARM   Final   Special Requests     Final   Value: BOTTLES DRAWN AEROBIC AND ANAEROBIC 10CC BLUE, 5CC RED   Culture  Setup Time     Final   Value: 01/05/2014 22:48     Performed at [facility]   Culture     Final   Value: STAPHYLOCOCCUS AUREUS     Note: RIFAMPIN AND GENTAMICIN SHOULD NOT BE USED AS SINGLE DRUGS FOR TREATMENT OF STAPH INFECTIONS.     Note: Gram Stain Report Called to,Read Back By and Verified With: Volanda Napoleon RN on 01/06/14 at 06:45 by Christie Nottingham     Performed at [facility]   Report Status 01/08/2014 FINAL   Final   Organism ID, Bacteria STAPHYLOCOCCUS AUREUS   Final  CULTURE, BLOOD (ROUTINE X 2)     Status: None   Collection Time    01/05/14  3:42 PM      Result Value Ref Range Status   Specimen Description BLOOD BLOOD LEFT FOREARM   Final   Special Requests BOTTLES DRAWN AEROBIC ONLY 5CC   Final   Culture  Setup Time     Final   Value: 01/05/2014 22:48     Performed at [facility]   Culture     Final   Value: STAPHYLOCOCCUS AUREUS     Note: SUSCEPTIBILITIES PERFORMED ON PREVIOUS CULTURE WITHIN THE LAST 5 DAYS.     Note: Gram Stain Report Called to,Read Back By and Verified With: Volanda Napoleon RN on 01/06/14 at 06:45 by Christie Nottingham     Performed at [facility]   Report Status 01/08/2014 FINAL   Final  CULTURE, BLOOD (ROUTINE X 2)     Status: None   Collection Time    01/06/14  3:35 PM      Result Value Ref Range Status   Specimen Description BLOOD LEFT ANTECUBITAL   Final   Special Requests     Final   Value: BOTTLES DRAWN AEROBIC AND ANAEROBIC 10CC AER 5CC ANA   Culture  Setup Time     Final   Value: 01/06/2014 19:02     Performed at [facility]   Culture     Final   Value: GRAM POSITIVE COCCI IN CLUSTERS     Note: Gram Stain Report Called to,Read Back By and Verified With: Junius ArgyleJANICE  TEMBRINA 01/08/14 AT 0100 RIDK     Performed at [facility]   Report Status PENDING   Incomplete  CULTURE, BLOOD (ROUTINE X 2)     Status: None   Collection Time    01/06/14  3:45 PM      Result Value Ref Range Status   Specimen Description BLOOD LEFT HAND   Final   Special Requests     Final   Value: BOTTLES DRAWN AEROBIC AND ANAEROBIC 10CC AER 5CC ANA   Culture  Setup Time     Final   Value: 01/06/2014 19:00     Performed at [facility]   Culture     Final   Value:        BLOOD CULTURE RECEIVED NO GROWTH TO DATE CULTURE WILL BE HELD FOR 5 DAYS BEFORE ISSUING A FINAL NEGATIVE REPORT     Performed at [facility]   Report Status PENDING   Incomplete   Studies/Results: [place] Abdomen Complete  01/08/2014   CLINICAL DATA:  Right upper quadrant pain with elevated hepatic function studies and history of cirrhosis, HIV and diabetes. Evaluate for ascites.  EXAM: ULTRASOUND ABDOMEN COMPLETE  COMPARISON:  Renal ultrasound of December 18, 2013  FINDINGS: Gallbladder:  The gallbladder is adequately distended with no evidence of stones. There is no positive sonographic Murphy's sign. There is no pericholecystic fluid.  Common bile duct:  Diameter: 3.3 mm  Liver:  There is no focal mass or ductal dilation. Portal venous flow is normal in direction toward the liver.  IVC:  No abnormality visualized.  Pancreas:  Visualized portion unremarkable.  Spleen:  The spleen exhibits normal echotexture and measures 9.5 cm in greatest dimension  Right Kidney:  Length: 10.1 cm. The echotexture of the right kidney is increased. There is no focal mass or hydronephrosis.  Left Kidney:  Length: 10.9 cm. The cortical echotexture of the left kidney is also mildly increased. There is no hydronephrosis or focal mass. 2.5 cm  Abdominal aorta:  No aneurysm visualized.  Other findings:  No ascites is demonstrated.  IMPRESSION: 1. Increased echotexture both kidneys is consistent with medical renal disease. There is  no hydronephrosis. 2. The liver, gallbladder, pancreas, and common bile duct exhibit no acute abnormalities. 3. No ascites is demonstrated.   Electronically Signed   By: David  [place]   On: 01/08/2014 14:41   Ir Removal Tun Access W/ Port W/o Fl Mod Sed  01/07/2014   CLINICAL DATA:  Infected right internal jugular vein Port-A-Cath.  EXAM: REMOVAL RIGHT IJ VEIN PORT-A-CATH  PROCEDURE: The right chest was prepped and draped in a sterile fashion. Lidocaine was utilized for local anesthesia. An incision was made over the previously healed surgical incision. Utilizing blunt dissection, the port catheter and reservoir were removed from the underlying subcutaneous tissue in their entirety. Securing sutures were also removed. The pocket was irrigated with a copious amount of sterile normal saline. Iodoform gauze was packed into the surgical bed. A sterile dressing was applied.  IMPRESSION: Successful removal of a Port-A-Cath for infection. The pocket was packed with iodoform which will need to be retracted an inch every day. The wound should hopefully close by secondary intention.   Electronically Signed   By: Maryclare BeanArt  Hoss M.D.   On:  01/07/2014 12:02   Medications: I have reviewed the patient's current medications. Scheduled Meds: . abacavir  300 mg Oral BID  . acyclovir  800 mg Oral Daily  . aspirin EC  81 mg Oral Daily  .  ceFAZolin (ANCEF) IV  2 g Intravenous Q12H  . Chlorhexidine Gluconate Cloth  6 each Topical Q0600  . Darunavir Ethanolate  800 mg Oral Q breakfast  . enoxaparin (LOVENOX) injection  30 mg Subcutaneous Q24H  . hydrALAZINE  50 mg Oral 3 times per day  . hydrocerin   Topical BID  . [START ON 01/09/2014] insulin aspart  0-9 Units Subcutaneous TID WC  . insulin glargine  20 Units Subcutaneous QHS  . lacosamide  100 mg Oral BID  . lactulose  20 g Oral TID  . lamiVUDine  50 mg Oral Daily  . levETIRAcetam  500 mg Oral BID  . magic mouthwash w/lidocaine  5 mL Oral TID  . mupirocin ointment  1  application Nasal BID  . pantoprazole  40 mg Oral Daily  . ritonavir  100 mg Oral Q breakfast  . sodium bicarbonate  650 mg Oral BID  . 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  Sepsis due to MSSA: Her mental status is unchanged. She had fever with Tmax 102.31F on 8/9 and again overnight.  BC from 8/9 grew MSSA  2/2 vials. She had been on vancomycin for 2 days and cefazolin for 1 day.The source of her bacteremia is unclear at this time but her port-a-cath placed on 12/05/13 which was removed on 8/11. SBP less likely given minimum ascites, no abdominal pain, no changes in mental status. No signs of endocarditis on exam with no new murmur. 2D echo with no signs of endocarditis. Of note, elevated bilirubin likely 2/2 to sepsis (see ascites problem below). -Discontinued vancomycin per pharmacy  -Continue cefazolin  -Continue monitoring for s/s of SBP   Acute on Chronic Renal Failure : Cr trending down to 3.1 from 3.44. BL cr is 2.3. This is likely multifactorial from NPO state with no IVF, furosemide use, ACEi use, and possibly Vancomycin use.  -Strict in and outs  -Hold Lasix and ACEi   Hepatic Encephalopathy: Resolved. She arrived lethargic which was concerning for acute hepatic encephalopathy. Ammonia level on admission was 127 and increased to 161, but she is back to her baseline mental status today.  -Continue home lactulose PO PRN titrated to 3BM/day  -Diet heart healthy/ Carb mod only if fully alert   Ascites: Soft and obese abdomen, only mildly distended and not  tense. CT abdomen/pelvis on presentation with only mild ascities, no acute intraabdominal process. She had mild RUQ pain today but [place] Abd was unremarkable for acute gall bladder disease. Not concerned for SBP at this time. Bilirubin 5.4 today.  -Will consider diagnostic/therapeutic tap of ascitic fluid if she declines  -CMP in am   History of seizure: She takes Dilantin at home which can be sedating. This may have precipitated her somnolence, as her corrected level was elevated at 25 on presentation. Dilantin has been discontinued.  -Continue vimpat  -Continue  Keppra   Hypokalemia: Improved. K is 3.3 today. Will continue monitoring and provide gentle repletion given her CKD4 if K is at or <3.0.   Hepatitis C: Genotype in December 2014. Genotype 1b, viral load of 4276200, Log of 6.63.  -She will f/u with Dr. Drue Second in ID for possible treatment of Hep C   HIV disease: Well controlled with viral load undectable in 09/08/13. CD4 count in April at 280. Will recheck labs as dilantin level has been super therapeutic and can interact with antiretrovirals  -Repeat CD4  -Recheck viral load  -Continue home abacavir, prezista, norvir, and epivir  -Continue acyclovir for prophylaxis   HTN: Elevated today in setting of holding antihypertensives last night.  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 Secondary to Hepatitis C infection. 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 Platelets and INR stable.   DMII: Last Hg A1C 7.5% on 11/13/13. On Lantus 30 u qHS and Novolog 10-25 u actid at home.  -Lantus 20 units qHS  -Sensitive SSI   Chronic Anemia: Likely anemia of chronic disease. Her baseline Hg is ~8. Two weeks ago her hemoglobin was lower around 6.7; since then it has been steadily increasing. Today her HbG is 8.0.    DVT prophylaxis: SCDs   Diet:  HH, Carb mod  Dispo: Disposition is deferred at this time, awaiting improvement of current medical problems. Anticipated discharge in approximately 2-3 day(s).   The patient does have a current PCP Christen Bame, MD) and does need an [facility] hospital follow-up appointment after discharge.  The patient does have transportation limitations that hinder transportation to clinic appointments.      .Services Needed at time of discharge: Y = Yes, Blank = No PT: Yes--SNF if sister is not able to assist  OT: Yes  RN:   Equipment:   Other:     LOS: 5 days   Ky Barban, MD 01/08/2014, 6:37 PM

## 2014-01-08 NOTE — Progress Notes (Addendum)
Patient is doing well. Denies fever, chills, nausea, or vomiting. She does complain of mild abdominal pain that she says is the same as yesterday.  Filed Vitals:   01/07/14 0930 01/07/14 0959 01/07/14 1244 01/07/14 1900  BP: 132/65 137/68 168/75 156/77  Pulse: 85 87 95 95  Temp:  99.6 F (37.6 C) 99.5 F (37.5 C) 99.5 F (37.5 C)  TempSrc:  Oral Oral Oral  Resp: 16   20  Height:    5\' 5"  (1.651 m)  Weight:    172 lb 2.9 oz (78.1 kg)  SpO2: 100% 98% 98% 98%   General: NAD, sleeping in bed, arouses to voice CV: RRR Resp: CTAB Abdomen: soft, minimally tender to palpation in RUQ, mildly distended Neuro: alert, answers questions appropriately  Continue to monitor Called for fever of 102 - Blood cultures sent and tylenol 650mg  once given.  Griffin Basil, PGY1 Internal Medicine Teaching Service

## 2014-01-08 NOTE — ED Provider Notes (Signed)
Addendum to ED visit from 12/29/13: Medical screening examination/treatment/procedure(s) were conducted as a shared visit with non-physician practitioner(s) and myself.  I personally evaluated the patient during the encounter.   EKG Interpretation None       Doug Sou, MD 01/08/14 1154

## 2014-01-08 NOTE — Progress Notes (Signed)
Pt's BP 185/97 rechecked manually. HR 108. Pt asymptomatic with no complaints. MD notified and aware.

## 2014-01-08 NOTE — Progress Notes (Signed)
Occupational Therapy Treatment Patient Details Name: Michelle [name] MRN: [id number] DOB: 01/17/1961 Today's Date: 01/08/2014    History of present illness Patient is a 53 y/o female admitted from home with increased lethargy for the last 24 hours and AMS. Diagnosed with hepatic encephalopathy and ascites. PMH positive for cirrhotic liver secondary to Hep C infection, HIV, CKD stage IV, CVA, DM II, seizures, HTN and chronic anemia.   OT comments  Pt seen today for ADL session. Pt continues to be lethargic and requires constant verbal and tactile cues to remain alert during session. Pt moves slowly but appears more appropriate. Pt perseverating on NPO status and repeatedly requesting water despite explanation of NPO.    Follow Up Recommendations  Home health OT;Supervision/Assistance - 24 hour    Equipment Recommendations  3 in 1 bedside comode       Precautions / Restrictions Precautions Precautions: Fall Precaution Comments: Contact precautions. Restrictions Weight Bearing Restrictions: No       Mobility Bed Mobility Overal bed mobility: Needs Assistance Bed Mobility: Supine to Sit;Sit to Supine   Sidelying to sit: HOB elevated;Min assist Supine to sit: Supervision;HOB elevated Sit to supine: Min guard   General bed mobility comments: Increasd time for bed mobility. Required max verbal cues to scoot to EOB. Use of rails for support.   Transfers Overall transfer level: Needs assistance Equipment used: 1 person hand held assist Transfers: Sit to/from [employer] CorporationStand;Stand Pivot Transfers Sit to Stand: Min assist Stand pivot transfers: Min assist       General transfer comment: Pt declining use of RW stating "get that out of the way." Explained use of RW for increased independence and safety, but pt preferred 1 person hand held assist.     Balance Overall balance assessment: Needs assistance Sitting-balance support: No upper extremity supported;Feet supported Sitting balance-Leahy  Scale: Fair Sitting balance - Comments: Able to sit EOB, but required verbal cues to remain alert.   Standing balance support: Single extremity supported;During functional activity Standing balance-Leahy Scale: Poor Standing balance comment: Pt declined RW use and preferred 1 person hand held assist.                   ADL Overall ADL's : Needs assistance/impaired Eating/Feeding: NPO Eating/Feeding Details (indicate cue type and reason): pt frustrated and requesting water multiple times throughout session despite being told she was NPO.  Grooming: Minimal assistance;Sitting Grooming Details (indicate cue type and reason): pt able to sit EOB to perform, however required verbal and tactile cues to remain alert and complete task.                 Toilet Transfer: Minimal assistance;Stand-pivot;Cueing for safety;Cueing for sequencing (1 person hand held (declined using RW))           Functional mobility during ADLs: Minimal assistance General ADL Comments: Pt continues to be inconsistent with initiation and completion of simple tasks due to lethargy and confusion.       Vision                 Additional Comments: Unable to assess due to lethargy and confusion.          Cognition  Arousal/Alertness: Lethargic Behavior During Therapy: Flat affect Overall Cognitive Status: Impaired/Different from baseline Area of Impairment: Orientation;Memory;Attention;Following commands;Safety/judgement;Awareness;Problem solving Orientation Level: Disoriented to;Situation;Time Current Attention Level: Focused Memory: Decreased short-term memory  Following Commands: Follows one step commands inconsistently;Follows one step commands with increased time Safety/Judgement: Decreased awareness of safety Awareness:  Emergent Problem Solving: Slow processing;Decreased initiation;Difficulty sequencing;Requires verbal cues General Comments: Pt perseverating on NPO status and test,  repeatedly asking "When are they coming to take my picture?" Requires constant stimulus to follow commands and maintain arousal. Increased time to perform all mobility and answer questions. Pt easily gets distracted while talking or being spoken to.                  Pertinent Vitals/ Pain       Pain Assessment: No/denies pain         Frequency Min 2X/week     Progress Toward Goals  OT Goals(current goals can now be found in the care plan section)  Progress towards OT goals: Progressing toward goals (Slowly)     Plan Discharge plan remains appropriate       End of Session Equipment Utilized During Treatment: Gait belt   Activity Tolerance Patient limited by lethargy   Patient Left in bed;with call bell/phone within reach (heading to Ultrasound)   Nurse Communication          Time: 5747-3403 OT Time Calculation (min): 26 min  Charges: OT General Charges $OT Visit: 1 Procedure OT Treatments $Self Care/Home Management : 23-37 mins  Rae Lips [phone] 01/08/2014, 3:22 PM

## 2014-01-08 NOTE — Progress Notes (Signed)
Charge nurse in room when pt start to seize at 2125. Pt alert and oriented throughout entire seizure. Rapid response called. On call physician called - came to bedside. BP elevated. BP meds ordered. Physician paged again when pt start to seize again. One dose IV ativan ordered. Pt seize a third time - rapid response nurse at bedside - stated to notify physician. Another dose of IV ativan ordered. Pt alert during all episodes of seizures. Pt appear to jerk and shake during seizures. Pt states she is worried. Spiritual care consult ordered. Pt consoled by staff at bedside. Pt in no s/s of distress at this time.

## 2014-01-08 NOTE — Progress Notes (Signed)
Per lab, patient has positive blood cultures - gram positive cocci in clusters and positive anaerobic bottle. MD made aware. No new orders at this time.

## 2014-01-08 NOTE — Progress Notes (Signed)
Echocardiogram 2D Echocardiogram has been performed.  Shanay Woolman 01/08/2014, 4:28 PM

## 2014-01-08 NOTE — Progress Notes (Signed)
  I have seen and examined the patient, and reviewed the daily progress note by Lillia Carmel, MS 4 and discussed the care of the patient with them. Please see my progress note from 01/08/2014 for further details regarding assessment and plan.    Signed:  Ky Barban, MD 01/08/2014, 6:36 PM

## 2014-01-08 NOTE — Progress Notes (Signed)
  Date: 01/08/2014  Patient name: Michelle [name]  Medical record number: [id number]  Date of birth: 10-05-1960   This patient has been seen and the plan of care was discussed with the house staff. Please see their note for complete details. I concur with their findings with the following additions/corrections:  53yo F with HIV-HCV co infection presented initially with hepatic encephalopathy but subsequantly developed fevers and found to have MSSA complicated bacteremia. She is on cefazolin. Acute on chronic kidney disease is improving but still far from baseline. Still having fevers 24hr after having subcutaneous port removed, concern that still not identified source of infection. Patient had TEE deferred due to being solomnent this morning but patient does have history of being more response to some staff than others. TEE now postponed til Friday. She has new onset abdominal pain, will repeat abd u/s.  Judyann Munson, MD 01/08/2014, 8:44 PM

## 2014-01-08 NOTE — Progress Notes (Signed)
S: Nurse called that pt having seizure. Went to eval pt. Upon arrival pt had complete resolution of seizure. Movement was noted to be some hand jerking and pt had been talking during entire episode to staff present. After first evaluation not even 2 minutes later pt entered repeat seizure activity again and had some shoulder jerking and "starring." Pt had called out in anticipation of seizure (later couldn't isolate any aura) and then had starring. Pt had some urinary and stool incontinence. Denied any CP, SOB, abd pain, HA, or blurry vision. Pt very anxious and upset, crying, describing her family visiting her tomorrow including her daughter.   O:  Filed Vitals:   01/08/14 1806  BP: 185/97  Pulse: 108  Temp:   Resp:    General: resting in bed, mumbling, pt recognizes this provider and states name correctly HEENT: PERRL, EOMI, slight scleral icterus Cardiac: tachy, RR, no rubs, murmurs or gallops Pulm: clear to auscultation bilaterally, no crackles/wheezes/rhonchi, moving normal volumes of air Abd: soft, slight ttp over RUQ/epigastrium with deep palpation, distended, BS present Ext: warm and well perfused, no pedal edema GU: stool and urinary incontinence with 2nd episode  Neuro: alert and oriented X3, cranial nerves II-XII grossly intact, 5/5 LE strength and 5/5 UE strength, no amnesia Psych: very anxious, stuttering, crying   A/P: DDx include pt has underlying seizure disorder, breakthrough seizure, psychogenic seizure (given 1st episode were pt was talking and stated was "controlling it" provoked by intense fear of being alone and having family visit per patient)  Upon chart review it appears that the patient is only taking 500mg  keppra when pt was taking 1500mg  bid at home and vimpat was added as a new medicine instead of dilantin. Therefore pt could be experiencing activity in the light of decreased dosage.  -IV keppra 1000mg  -IV ativan 0.5mg  ( in setting of liver disease and previous  somnolence/AMS with benzo) -cmet, cbc, trop, prolactin, ekg, Mg, PO4 all sent -move pt to a camera room -seizure precautions   -chaplain for comfort  HTN: pt has some elevated BP readings more to pt baseline SBP 190s. Meds had been held in light of hypotension on 8/10-8/11 during discovery of MSSA sepsis.  -restart home meds of lisinopril, labetalol, and clonidine -cont to monitor  Christen Bame, MD PGY-3 Pgr: [phone]

## 2014-01-08 NOTE — Progress Notes (Signed)
Subjective: Michelle [name] was at baseline mental status, fully confluent and conversant this morning. She occasionally does not respond to questions and dozes off in the interview, but when asked about changing her diet from NPO to heart health she immediately is alert and oriented x 3.   She endorses being short of breath, but denies chest pain, palpitations, and chills.   Objective: Vital signs in last 24 hours: Filed Vitals:   01/08/14 0440 01/08/14 0725 01/08/14 1200 01/08/14 1503  BP: 156/76   178/86  Pulse: 111   97  Temp: 102.4 F (39.1 C) 100.6 F (38.1 C) 100 F (37.8 C) 99.7 F (37.6 C)  TempSrc: Oral Oral  Oral  Resp: 20   20  Height:      Weight:      SpO2: 97%   98%   Weight change:   Intake/Output Summary (Last 24 hours) at 01/08/14 1543 Last data filed at 01/08/14 1300  Gross per 24 hour  Intake    150 ml  Output      0 ml  Net    150 ml   Physical Exam:  Gen: Lying in bed applying eucerin cream to legs, following commands and answering questions appropriately HEENT: scleral icterus present CV: RRR, no murmurs, rubs, or gallops. Normal S1 and S2. No appreciable JVD.  Resp: Crackles present in all lung fields, no rhonchi or rales, mildly increased respiratory effort Abd: mildly distended, soft, bowel sounds present, no caput medusa. Tenderness along RUQ and positive Murphy's sign. Ext: no peripheral edema, dry skin. Splinter hemorrhage noted on left 5th digit nailbed.  Neuro: Alert and oriented x3, no slurred speech, CN II-XII intact, no asterixis  Lab Results: Basic Metabolic Panel:  Recent Labs Lab 01/07/14 0216 01/08/14 0656  NA 136* 138  K 3.5* 3.3*  CL 103 105  CO2 16* 14*  GLUCOSE 166* 148*  BUN 61* 55*  CREATININE 3.44* 3.01*  CALCIUM 6.9* 7.2*   Liver Function Tests:  Recent Labs Lab 01/07/14 0216 01/08/14 0656 01/08/14 0925  AST 45* 79*  --   ALT 37* 26  --   ALKPHOS 350* 447*  --   BILITOT 5.5* 5.5* 5.4*  PROT 5.3* 5.6*  --     ALBUMIN 1.7* 1.6*  --     Recent Labs Lab 01/03/14 2115  LIPASE 116*    Recent Labs Lab 01/04/14 0100 01/04/14 0730  AMMONIA 127* 161*   CBC:  Recent Labs Lab 01/07/14 0216 01/08/14 0656  WBC 12.1* 9.4  NEUTROABS 10.2* 7.3  HGB 7.8* 8.0*  HCT 23.1* 23.9*  MCV 104.5* 108.1*  PLT 133* 140*   CBG:  Recent Labs Lab 01/07/14 1936 01/07/14 2155 01/07/14 2344 01/08/14 0348 01/08/14 0757 01/08/14 1134  GLUCAP 204* 179* 168* 156* 154* 107*   Coagulation:  Recent Labs Lab 01/04/14 0730 01/08/14 0925  LABPROT 12.4 12.0  INR 0.92 0.89   Anemia Panel:  Recent Labs Lab 01/04/14 0730  VITAMINB12 564  FERRITIN 611*  TIBC 180*  IRON 90   Urine Drug Screen: Drugs of Abuse     Component Value Date/Time   LABOPIA NONE DETECTED 01/03/2014 2311   LABOPIA NEG 11/13/2013 1516   COCAINSCRNUR NONE DETECTED 01/03/2014 2311   COCAINSCRNUR NEG 11/13/2013 1516   LABBENZ NONE DETECTED 01/03/2014 2311   LABBENZ NEG 11/13/2013 1516   AMPHETMU NONE DETECTED 01/03/2014 2311   THCU NONE DETECTED 01/03/2014 2311   LABBARB NONE DETECTED 01/03/2014 2311   LABBARB NEG  11/13/2013 1516    Urinalysis:  Recent Labs Lab 01/03/14 2311 01/06/14 1141  COLORURINE YELLOW ORANGE*  LABSPEC 1.010 1.021  PHURINE 6.0 5.5  GLUCOSEU 100* 100*  HGBUR NEGATIVE NEGATIVE  BILIRUBINUR NEGATIVE LARGE*  KETONESUR NEGATIVE NEGATIVE  PROTEINUR >300* >300*  UROBILINOGEN 1.0 1.0  NITRITE NEGATIVE NEGATIVE  LEUKOCYTESUR NEGATIVE TRACE*   Micro Results: Recent Results (from the past 240 hour(s))  MRSA PCR SCREENING     Status: Abnormal   Collection Time    01/04/14  4:35 AM      Result Value Ref Range Status   MRSA by PCR POSITIVE (*) NEGATIVE Final   Comment:            The GeneXpert MRSA Assay (FDA     approved for NASAL specimens     only), is one component of a     comprehensive MRSA colonization     surveillance program. It is not     intended to diagnose MRSA     infection nor to guide or      monitor treatment for     MRSA infections.     RESULT CALLED TO, READ BACK BY AND VERIFIED WITH:     R.ZELLNER,RN [id number] 01/04/14 M.CAMPBELL  CULTURE, BLOOD (ROUTINE X 2)     Status: None   Collection Time    01/05/14  3:30 PM      Result Value Ref Range Status   Specimen Description BLOOD LEFT ARM   Final   Special Requests     Final   Value: BOTTLES DRAWN AEROBIC AND ANAEROBIC 10CC BLUE, 5CC RED   Culture  Setup Time     Final   Value: 01/05/2014 22:48     Performed at [facility]   Culture     Final   Value: STAPHYLOCOCCUS AUREUS     Note: RIFAMPIN AND GENTAMICIN SHOULD NOT BE USED AS SINGLE DRUGS FOR TREATMENT OF STAPH INFECTIONS.     Note: Gram Stain Report Called to,Read Back By and Verified With: Volanda NapoleonNikki Faucette RN on 01/06/14 at 06:45 by Christie NottinghamAnne Skeen     Performed at [facility]   Report Status 01/08/2014 FINAL   Final   Organism ID, Bacteria STAPHYLOCOCCUS AUREUS   Final  CULTURE, BLOOD (ROUTINE X 2)     Status: None   Collection Time    01/05/14  3:42 PM      Result Value Ref Range Status   Specimen Description BLOOD BLOOD LEFT FOREARM   Final   Special Requests BOTTLES DRAWN AEROBIC ONLY 5CC   Final   Culture  Setup Time     Final   Value: 01/05/2014 22:48     Performed at [facility]   Culture     Final   Value: STAPHYLOCOCCUS AUREUS     Note: SUSCEPTIBILITIES PERFORMED ON PREVIOUS CULTURE WITHIN THE LAST 5 DAYS.     Note: Gram Stain Report Called to,Read Back By and Verified With: Volanda NapoleonNikki Faucette RN on 01/06/14 at 06:45 by Christie NottinghamAnne Skeen     Performed at [facility]   Report Status 01/08/2014 FINAL   Final  CULTURE, BLOOD (ROUTINE X 2)     Status: None   Collection Time    01/06/14  3:35 PM      Result Value Ref Range Status   Specimen Description BLOOD LEFT ANTECUBITAL   Final   Special Requests     Final   Value: BOTTLES DRAWN AEROBIC AND  ANAEROBIC 10CC AER 5CC ANA   Culture  Setup Time     Final   Value: 01/06/2014 19:02      Performed at [facility]   Culture     Final   Value: GRAM POSITIVE COCCI IN CLUSTERS     Note: Gram Stain Report Called to,Read Back By and Verified With: Junius Argyle 01/08/14 AT 0100 RIDK     Performed at [facility]   Report Status PENDING   Incomplete  CULTURE, BLOOD (ROUTINE X 2)     Status: None   Collection Time    01/06/14  3:45 PM      Result Value Ref Range Status   Specimen Description BLOOD LEFT HAND   Final   Special Requests     Final   Value: BOTTLES DRAWN AEROBIC AND ANAEROBIC 10CC AER 5CC ANA   Culture  Setup Time     Final   Value: 01/06/2014 19:00     Performed at [facility]   Culture     Final   Value:        BLOOD CULTURE RECEIVED NO GROWTH TO DATE CULTURE WILL BE HELD FOR 5 DAYS BEFORE ISSUING A FINAL NEGATIVE REPORT     Performed at [facility]   Report Status PENDING   Incomplete   Studies/Results: US Abdomen Complete  01/08/2014   CLINICAL DATA:  Right upper quadrant pain with elevated hepatic function studies and history of cirrhosis, HIV and diabetes. Evaluate for ascites.  EXAM: ULTRASOUND ABDOMEN COMPLETE  COMPARISON:  Renal ultrasound of December 18, 2013  FINDINGS: Gallbladder:  The gallbladder is adequately distended with no evidence of stones. There is no positive sonographic Murphy's sign. There is no pericholecystic fluid.  Common bile duct:  Diameter: 3.3 mm  Liver:  There is no focal mass or ductal dilation. Portal venous flow is normal in direction toward the liver.  IVC:  No abnormality visualized.  Pancreas:  Visualized portion unremarkable.  Spleen:  The spleen exhibits normal echotexture and measures 9.5 cm in greatest dimension  Right Kidney:  Length: 10.1 cm. The echotexture of the right kidney is increased. There is no focal mass or hydronephrosis.  Left Kidney:  Length: 10.9 cm. The cortical echotexture of the left kidney is also mildly increased. There is no hydronephrosis or focal mass. 2.5 cm  Abdominal aorta:   No aneurysm visualized.  Other findings:  No ascites is demonstrated.  IMPRESSION: 1. Increased echotexture both kidneys is consistent with medical renal disease. There is no hydronephrosis. 2. The liver, gallbladder, pancreas, and common bile duct exhibit no acute abnormalities. 3. No ascites is demonstrated.   Electronically Signed   By: David  [place]   On: 01/08/2014 14:41   Ir Removal Tun Access W/ Port W/o Fl Mod Sed  01/07/2014   CLINICAL DATA:  Infected right internal jugular vein Port-A-Cath.  EXAM: REMOVAL RIGHT IJ VEIN PORT-A-CATH  PROCEDURE: The right chest was prepped and draped in a sterile fashion. Lidocaine was utilized for local anesthesia. An incision was made over the previously healed surgical incision. Utilizing blunt dissection, the port catheter and reservoir were removed from the underlying subcutaneous tissue in their entirety. Securing sutures were also removed. The pocket was irrigated with a copious amount of sterile normal saline. Iodoform gauze was packed into the surgical bed. A sterile dressing was applied.  IMPRESSION: Successful removal of a Port-A-Cath for infection. The pocket was packed with iodoform which will need to  be retracted an inch every day. The wound should hopefully close by secondary intention.   Electronically Signed   By: Maryclare Bean M.D.   On: 01/07/2014 12:02   Medications: Scheduled Meds: . abacavir  300 mg Oral BID  . acyclovir  800 mg Oral Daily  . aspirin EC  81 mg Oral Daily  .  ceFAZolin (ANCEF) IV  2 g Intravenous Q12H  . Chlorhexidine Gluconate Cloth  6 each Topical Q0600  . Darunavir Ethanolate  800 mg Oral Q breakfast  . enoxaparin (LOVENOX) injection  30 mg Subcutaneous Q24H  . hydrocerin   Topical BID  . insulin aspart  0-9 Units Subcutaneous 6 times per day  . insulin glargine  20 Units Subcutaneous QHS  . lacosamide  100 mg Oral BID  . lactulose  20 g Oral TID  . lamiVUDine  50 mg Oral Daily  . levETIRAcetam  500 mg Oral BID  .  magic mouthwash w/lidocaine  5 mL Oral TID  . mupirocin ointment  1 application Nasal BID  . pantoprazole  40 mg Oral Daily  . ritonavir  100 mg Oral Q breakfast  . sodium bicarbonate  650 mg Oral BID  . sodium chloride  3 mL Intravenous Q12H   Continuous Infusions:  PRN Meds:.acetaminophen, hydrOXYzine, menthol-cetylpyridinium, ondansetron (ZOFRAN) IV, ondansetron Assessment/Plan: Principal Problem:   Sepsis due to Staphylococcus aureus Active Problems:   HIV disease   CKD (chronic kidney disease) stage 4, GFR 15-29 ml/min   Diabetes   HCV (hepatitis C virus)   Anemia   Metabolic acidosis, increased anion gap   Altered mental state   Hepatic encephalopathy   Ascites   Fever, unspecified  Assessment:  Michelle [name] is a 53 year old woman with a PMH significant for HIV and Hep C coinfection complicated by cirrhotic liver, CKD stage IV, stroke, CHF, DMII who was admitted after being brought to the ED by family due to increased lethargy. Lethargy has resolved, but she now has MSSA bacteremia.  Sepsis due to Methicillin-sensitive Staphylococcus aureus: She had fever with Tmax 102.42F on 8/9 with mild tachycardia (to 105) but stable BP and baseline mental status. Blood cultures from 8/9 grew Methicillin-sensitive Staphylococcus aurerus 2/2 vials in less than 24 hours. She was started on Vancomycin treatment on 8/10 morning and cefazolin on 8/10 afternoon. Blood cultures from 8/10 has grown gram positive cocci in clusters, which is expected due to Michelle [name] being on vancomycin and cefazolin for less than 24 hours. Her portacath was removed on 8/11, but she was febrile last night with another Tmax of 102.90F, so source of infection is unclear. However, leukocytosis is downtrending and is wnl now at 9.4. Was scheduled for TEE for concerns of possible endocarditis, but due to concerns of multiorgan failure, this was changed to a TTE. A TEE has been tentatively planned for Friday.  -Discontinued vancomycin  in light of MSSA and AKI -Continue cefazolin  -TTE results pending -TEE scheduled for 8/14  -Continue monitoring for s/s of SBP  -Will allow permissive HTN and continue holding antihypertensives for now   Acute on Chronic Renal Failure : Cr declining from 3.44 to 3.01 today. Cr bump likely due being previously on ACEi, furosemide, Vanc, and NPO state with no IVF. Per clinical exam, she appears volume overloaded (crackles on exam), but seizure medications have been renally dosed and vancomycin has been discontinued given sensitivities.  Expecting Cr to continue to improve. -Strict in and outs  -Will consider giving  Lasix 20mg  po (low dose to prevent Cr bump) if O2 sats decline -Hold ACEi  -Fe Na study pending -Repeat UA pending  Hyperbilirubinemia: Bilirubin has been increasing from baseline of 1.9 to 5.5 today. Fractionated bilirubinemia showed direct bili levels of 4.9. Likely due to cholestasis post-portacath removal. Will continue to monitor.   Hepatic Encephalopathy: Resolved. She arrived lethargic which was concerning for acute hepatic encephalopathy. Ammonia level on admission was 127 and increased to 161, but she is back to her baseline mental status today.  -Continue home lactulose PO PRN titrated to 3BM/day  -Diet heart healthy/ Carb mod only if fully alert   Ascites: Soft and obese abdomen, only mildly distended and not tense. CT abdomen/pelvis on presentation with only mild ascities, no acute intraabdominal process. She denies increased abdominal distention and continues to be at baseline mental status.  Not concerned for SBP at this time. Bilirubin 5.5 today.  -Will consider diagnostic/therapeutic tap of ascitic fluid if she declines  -CMP in am  -Will consider abdominal US   History of seizure: Patient takes Dilantin at home which can be sedating. This may have precipitated her somnolence, as her corrected level per pharmacy is 25. Pharmacy on board and recommend decreasing  dilantin and getting repeat level after ~5 days. Also mention the possible need to adjust HIV medications due to concern for interaction.  -Continue to hold Dilantin  -Continue Vimpat  -Continue Keppra   Hypokalemia: K is at 3.3 today. Will continue monitoring and provide gentle repletion given her CKD4 if K is at or <3.0.   Hepatitis C: Genotype in December 2014. Genotype 1b, viral load of 4276200, Log of 6.63.  -She will f/u with Dr. Drue Second in ID for possible treatment of Hep C   HIV disease: Well controlled with viral load undectable in 09/08/13. CD4 count in April at 280. Will recheck labs as dilantin level has been super therapeutic and can interact with antiretrovirals  -Repeat CD4  -Recheck viral load  -Continue home abacavir, prezista, norvir, and epivir  -Continue acyclovir for prophylaxis   HTN: BP mildly elevated initially but improved after home antihypertensives were given.  -continue home clonidine 0.3mg  BID, hydralazine 50mg  TID, labetalol 200mg  BID, lisinopril 40mg  daily.  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 Secondary to Hepatitis C infection. 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 Platelets and INR stable.   DMII: Last Hg A1C 7.5% on 11/13/13. On Lantus 30 u qHS and Novolog 10-25 u actid at home.  -Lantus 20 units qHS  -Sensitive SSI   Chronic Anemia: Likely anemia of chronic disease. Her baseline Hg is ~8. Two weeks ago her hemoglobin was lower around 6.7; since then it has been steadily increasing. Today her HbG is 8.0.   DVT prophylaxis: SCDs   Diet:  HH, will be NPO after midnight on 8/13 for TEE on 8/14  Dispo: Disposition is deferred.  This is a Psychologist, occupational Note.  The care of the patient was discussed with Dr. Garald Braver and the assessment and plan formulated with their assistance.  Please see their attached note for official documentation of the daily encounter.   LOS: 5 days   Lillia Carmel, Med Student 01/08/2014, 3:43 PM

## 2014-01-09 DIAGNOSIS — R17 Unspecified jaundice: Secondary | ICD-10-CM

## 2014-01-09 DIAGNOSIS — Z515 Encounter for palliative care: Secondary | ICD-10-CM

## 2014-01-09 LAB — COMPREHENSIVE METABOLIC PANEL
ALT: 14 U/L (ref 0–35)
ALT: 18 U/L (ref 0–35)
ANION GAP: 15 (ref 5–15)
AST: 62 U/L — ABNORMAL HIGH (ref 0–37)
AST: 62 U/L — ABNORMAL HIGH (ref 0–37)
Albumin: 1.5 g/dL — ABNORMAL LOW (ref 3.5–5.2)
Albumin: 1.5 g/dL — ABNORMAL LOW (ref 3.5–5.2)
Alkaline Phosphatase: 430 U/L — ABNORMAL HIGH (ref 39–117)
Alkaline Phosphatase: 444 U/L — ABNORMAL HIGH (ref 39–117)
Anion gap: 17 — ABNORMAL HIGH (ref 5–15)
BUN: 51 mg/dL — AB (ref 6–23)
BUN: 49 mg/dL — AB (ref 6–23)
CALCIUM: 7.2 mg/dL — AB (ref 8.4–10.5)
CALCIUM: 7.2 mg/dL — AB (ref 8.4–10.5)
CO2: 17 mEq/L — ABNORMAL LOW (ref 19–32)
CO2: 16 meq/L — AB (ref 19–32)
Chloride: 103 mEq/L (ref 96–112)
Chloride: 108 mEq/L (ref 96–112)
Creatinine, Ser: 2.77 mg/dL — ABNORMAL HIGH (ref 0.50–1.10)
Creatinine, Ser: 2.83 mg/dL — ABNORMAL HIGH (ref 0.50–1.10)
GFR calc Af Amer: 21 mL/min — ABNORMAL LOW (ref >90)
GFR calc non Af Amer: 18 mL/min — ABNORMAL LOW (ref >90)
GFR, EST AFRICAN AMERICAN: 21 mL/min — AB (ref >90)
GFR, EST NON AFRICAN AMERICAN: 18 mL/min — AB (ref >90)
GLUCOSE: 197 mg/dL — AB (ref 70–99)
GLUCOSE: 256 mg/dL — AB (ref 70–99)
Potassium: 3.5 mEq/L — ABNORMAL LOW (ref 3.7–5.3)
Potassium: 4 mEq/L (ref 3.7–5.3)
SODIUM: 136 meq/L — AB (ref 137–147)
Sodium: 140 mEq/L (ref 137–147)
TOTAL PROTEIN: 5.4 g/dL — AB (ref 6.0–8.3)
Total Bilirubin: 5.2 mg/dL — ABNORMAL HIGH (ref 0.3–1.2)
Total Bilirubin: 6.1 mg/dL — ABNORMAL HIGH (ref 0.3–1.2)
Total Protein: 5.4 g/dL — ABNORMAL LOW (ref 6.0–8.3)

## 2014-01-09 LAB — PHOSPHORUS: Phosphorus: 3.2 mg/dL (ref 2.3–4.6)

## 2014-01-09 LAB — MAGNESIUM: Magnesium: 1.7 mg/dL (ref 1.5–2.5)

## 2014-01-09 LAB — CBC WITH DIFFERENTIAL/PLATELET
Basophils Absolute: 0 10*3/uL (ref 0.0–0.1)
Basophils Relative: 0 % (ref 0–1)
Eosinophils Absolute: 0 10*3/uL (ref 0.0–0.7)
Eosinophils Relative: 0 % (ref 0–5)
HCT: 20.9 % — ABNORMAL LOW (ref 36.0–46.0)
Hemoglobin: 7 g/dL — ABNORMAL LOW (ref 12.0–15.0)
LYMPHS ABS: 1 10*3/uL (ref 0.7–4.0)
Lymphocytes Relative: 11 % — ABNORMAL LOW (ref 12–46)
MCH: 35.2 pg — ABNORMAL HIGH (ref 26.0–34.0)
MCHC: 33.5 g/dL (ref 30.0–36.0)
MCV: 105 fL — AB (ref 78.0–100.0)
Monocytes Absolute: 0.8 10*3/uL (ref 0.1–1.0)
Monocytes Relative: 9 % (ref 3–12)
NEUTROS PCT: 80 % — AB (ref 43–77)
Neutro Abs: 6.8 10*3/uL (ref 1.7–7.7)
PLATELETS: 125 10*3/uL — AB (ref 150–400)
RBC: 1.99 MIL/uL — AB (ref 3.87–5.11)
RDW: 14 % (ref 11.5–15.5)
WBC: 8.6 10*3/uL (ref 4.0–10.5)

## 2014-01-09 LAB — TROPONIN I
Troponin I: 0.34 ng/mL (ref <0.30)
Troponin I: <0.3 ng/mL (ref <0.30)

## 2014-01-09 LAB — GLUCOSE, CAPILLARY
GLUCOSE-CAPILLARY: 252 mg/dL — AB (ref 70–99)
Glucose-Capillary: 169 mg/dL — ABNORMAL HIGH (ref 70–99)
Glucose-Capillary: 171 mg/dL — ABNORMAL HIGH (ref 70–99)
Glucose-Capillary: 202 mg/dL — ABNORMAL HIGH (ref 70–99)

## 2014-01-09 LAB — CK: CK TOTAL: 59 U/L (ref 7–177)

## 2014-01-09 LAB — SAVE SMEAR

## 2014-01-09 LAB — PROLACTIN: Prolactin: 16.3 ng/mL

## 2014-01-09 MED ORDER — POTASSIUM CHLORIDE CRYS ER 10 MEQ PO TBCR
30.0000 meq | EXTENDED_RELEASE_TABLET | Freq: Once | ORAL | Status: AC
  Administered 2014-01-09: 30 meq via ORAL
  Filled 2014-01-09: qty 1

## 2014-01-09 MED ORDER — LORAZEPAM 2 MG/ML IJ SOLN
INTRAMUSCULAR | Status: AC
  Administered 2014-01-09: 21:00:00
  Filled 2014-01-09: qty 1

## 2014-01-09 MED ORDER — LACOSAMIDE 50 MG PO TABS
150.0000 mg | ORAL_TABLET | Freq: Two times a day (BID) | ORAL | Status: DC
  Administered 2014-01-10 – 2014-01-15 (×12): 150 mg via ORAL
  Filled 2014-01-09 (×12): qty 3

## 2014-01-09 MED ORDER — LORAZEPAM 2 MG/ML IJ SOLN
0.5000 mg | Freq: Once | INTRAMUSCULAR | Status: AC
  Administered 2014-01-09: 0.5 mg via INTRAVENOUS

## 2014-01-09 NOTE — Progress Notes (Signed)
NUTRITION FOLLOW UP  Intervention:   -No supplements at this time.  -Continue to encourage adequate PO intake.  Nutrition Dx:   Inadequate oral intake related to inability to eat as evidenced by NPO status; resolved  Goal:   Pt to meet >/= 90% of their estimated nutrition needs; met  Monitor:   PO intake, weight, labs, I/Os  Assessment:   53 year old female with history of CKD stage 4, DM, HIV, hepatitis C, brought to the ED with increasing lethargy for the last 24 hours.   - Pt sitting up in bed finishing 100% of lunch. She reports that she is eating well and has a good appetite. No nutritional supplements needed at this time. Question accuracy of weight since admission.   Na and K WNL  Height: Ht Readings from Last 1 Encounters:  01/07/14 5' 5"  (1.651 m)    Weight Status:   Wt Readings from Last 1 Encounters:  01/07/14 172 lb 2.9 oz (78.1 kg)    Re-estimated needs:  Kcal: 1850-2000 kcal  Protein: 80-95 g  Fluid: >2.0 L/day  Skin: Incision on right chest  Diet Order:     Intake/Output Summary (Last 24 hours) at 01/09/14 1327 Last data filed at 01/09/14 0900  Gross per 24 hour  Intake   1112 ml  Output    300 ml  Net    812 ml    Last BM: 8/12   Labs:   Recent Labs Lab 01/08/14 0656 01/08/14 2340 01/09/14 0612  NA 138 136* 140  K 3.3* 3.5* 4.0  CL 105 103 108  CO2 14* 16* 17*  BUN 55* 49* 51*  CREATININE 3.01* 2.77* 2.83*  CALCIUM 7.2* 7.2* 7.2*  MG  --  1.7  --   PHOS  --  3.2  --   GLUCOSE 148* 256* 197*    CBG (last 3)   Recent Labs  01/08/14 2142 01/09/14 0741 01/09/14 1152  GLUCAP 207* 169* 252*    Scheduled Meds: . abacavir  300 mg Oral BID  . acyclovir  800 mg Oral Daily  . aspirin EC  81 mg Oral Daily  .  ceFAZolin (ANCEF) IV  2 g Intravenous Q12H  . Chlorhexidine Gluconate Cloth  6 each Topical Q0600  . cloNIDine  0.3 mg Oral TID  . Darunavir Ethanolate  800 mg Oral Q breakfast  . enoxaparin (LOVENOX) injection  30 mg  Subcutaneous Q24H  . hydrALAZINE  50 mg Oral 3 times per day  . hydrocerin   Topical BID  . insulin aspart  0-9 Units Subcutaneous TID WC  . insulin glargine  20 Units Subcutaneous QHS  . labetalol  600 mg Oral BID  . lacosamide  100 mg Oral BID  . lactulose  20 g Oral TID  . lamiVUDine  50 mg Oral Daily  . levETIRAcetam  500 mg Oral BID  . magic mouthwash w/lidocaine  5 mL Oral TID  . pantoprazole  40 mg Oral Daily  . ritonavir  100 mg Oral Q breakfast  . sodium bicarbonate  650 mg Oral BID  . sodium chloride  3 mL Intravenous Q12H    Continuous Infusions:   Terrace Arabia RD, LDN

## 2014-01-09 NOTE — Progress Notes (Signed)
CRITICAL VALUE ALERT  Critical value received:  Troponin    Date of notification:  01/09/14  Time of notification:  0030  Critical value read back: Yes  Nurse who received alert:  Lissa Hoard, charge nurse  MD notified (1st page):  Luberta Mutter  Time of first page:  0032  MD notified (2nd page):  Time of second page:  Responding MD:  Luberta Mutter  Time MD responded:  [phone]

## 2014-01-09 NOTE — Progress Notes (Signed)
  Date: 01/09/2014  Patient name: Michelle [name]  Medical record number: [id number]  Date of birth: 04-09-1961   This patient has been seen and the plan of care was discussed with the house staff. Please see their note for complete details. I concur with their findings with the following additions/corrections:  Patient had 2 spells overnight, one appeared to be consistent with seizure. We will ask neurology to weigh in the plan to keep her on renally dosed keppra and lacosamide. lacosamide was recently started so unclear if at therapeutic dose. We will proceed to have her undergo tee to evaluated for endocarditis. She is afebrile and recent blood cx are ngtd at 24hr. Will continue her on cefazolin.  Judyann Munson, MD 01/09/2014, 5:46 PM

## 2014-01-09 NOTE — Progress Notes (Signed)
Noted that postprandial blood sugars are elevated.  Recommend increasing Novolog correction scale to MODERATE TID &HS.  May need to increase Lantus dosage if fasting CBGs greater than 180 mg/dl.  Patient takes Lantus 30-50 units daily at home.  Will continue to follow while in hospital.  Smith Mince RN BSN CDE

## 2014-01-09 NOTE — Progress Notes (Signed)
  I have seen and examined the patient, and reviewed the daily progress note by Lillia Carmel, MS 4 and discussed the care of the patient with them. Please see my progress note from 01/09/2014 for further details regarding assessment and plan.    Signed:  Baltazar Apo, MD 01/09/2014, 5:52 PM

## 2014-01-09 NOTE — Progress Notes (Signed)
ANTIBIOTIC CONSULT NOTE - Follow-up  Pharmacy Consult for Cefazolin Indication: MSSA Bacteremia  Allergies  Allergen Reactions  . 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 Morphine And Related Hives, Itching and Rash  . Norvasc [Amlodipine Besylate] Hives, Itching and Rash    Patient Measurements: Height: 5\' 5"  (165.1 cm) Weight: 172 lb 2.9 oz (78.1 kg) IBW/kg (Calculated) : 57  Vital Signs: Temp: 98.8 F (37.1 C) (08/13 0546) Temp src: Oral (08/13 0546) BP: 134/67 mmHg (08/13 0546) Pulse Rate: 81 (08/13 0546) Intake/Output from previous day: 08/12 0701 - 08/13 0700 In: 752 [P.O.:702; IV Piggyback:50] Out: 300 [Urine:300] Intake/Output from this shift: Total I/O In: 360 [P.O.:360] Out: -   Labs:  Recent Labs  01/07/14 0216 01/08/14 0656 01/08/14 2340 01/09/14 0612  WBC 12.1* 9.4 10.8*  --   HGB 7.8* 8.0* 7.5*  --   PLT 133* 140* 132*  --   CREATININE 3.44* 3.01* 2.77* 2.83*   Estimated Creatinine Clearance: 23.5 ml/min (by C-G formula based on Cr of 2.83).  Assessment: 52yof on Cefazolin Day #4 for MSSA bacteremia. Tm 100.6. Fever curve trending down over past 24 hrs. Wbc slightly elevated. For TEE Friday-postponed due to somnolence. SCr trending down (peak 3.44).  8/10 Vanc>>8/12 8/10 Cefazolin>>  8/9 Bldx2>>2/2 MSSA 8/10 Bldx2>>1/2 MSSA 8/12 Bldx2>>ngtd   Goal of Therapy:  Appropriate cefazolin dosing  Plan:  1) Cefazolin 2g IV q12 2) Continue to follow renal function, repeat blood cultures  Christoper Fabian, PharmD, BCPS Clinical pharmacist, pager [phone] 01/09/2014,10:47 AM

## 2014-01-09 NOTE — Progress Notes (Signed)
A consult was issued for a visit from the Chaplain for this pt. Pt was groggy when chaplain arrived, but requested chaplain to pray with her. Chaplain prayed with pt. Pt denied needing any further services at this time.   Cindie Crumbly, [address]

## 2014-01-09 NOTE — Progress Notes (Signed)
Subjective: Ms. Norbury had two episodes of seizure-like activity last night (see resident note). During the first episode, Ms. Rencher was conversant during the entire episode and some hand jerking was noted. Another episode occurred two minutes later and shoulder jerking and starring was noted. Patient had urinary and stool incontinence during second episode, although no aura was noted.  Nursing called about another potential seizure at [facility]. Patient was fully conversant, AAO x 3, and able to communicate to nurse that she needed her medications. She remembers the entire episode and states she was initially unable to move her arm, but upon immediately taking her medication, it returned to full range of motion.   She has been afebrile for 24 hours with a Tmax of 100F. This morning Ms. Wiltsie was laying in bed fully conversant and at baseline mental status. She was accompanied by family. Ms. Legner was receptive to getting the TEE done tomorrow and going to a rehab center after discharge for her IV antibiotics. She denied SOB and chest pain. She denies further episodes of incontinence and states her last seizure was months ago.   Objective: Vital signs in last 24 hours: Filed Vitals:   01/08/14 1804 01/08/14 1806 01/09/14 0304 01/09/14 0546  BP: 175/85 185/97 133/70 134/67  Pulse:  108 83 81  Temp:   98.6 F (37 C) 98.8 F (37.1 C)  TempSrc:   Oral Oral  Resp:   20 22  Height:      Weight:      SpO2:   100% 98%   Weight change:   Intake/Output Summary (Last 24 hours) at 01/09/14 1218 Last data filed at 01/09/14 0900  Gross per 24 hour  Intake   1112 ml  Output    300 ml  Net    812 ml   Physical Exam:  Gen: Lying in bed in NAD, following commands appropriately, conversant although at times tearful HEENT: no scleral icterus, no tongue lesions, MMM CV: RRR, no murmurs, rubs, or gallops  Resp: normal respiratory effort, no wheezing, CTAB Abd: mildly distended, soft, bowel sounds present, no caput  medusa, RUQ mildly TTP  Ext: no peripheral edema, dry skin Neuro: Alert and oriented x3, no slurred speech, PERRL-A, EOMi, facial sensation and movements symmetric, palate rises symmetrically, turns head against resistance, tongue pertrusion midline, hearing intact. 5/5 muscle strength in UE and LE.   Lab Results: Basic Metabolic Panel:  Recent Labs Lab 01/08/14 2340 01/09/14 0612  NA 136* 140  K 3.5* 4.0  CL 103 108  CO2 16* 17*  GLUCOSE 256* 197*  BUN 49* 51*  CREATININE 2.77* 2.83*  CALCIUM 7.2* 7.2*  MG 1.7  --   PHOS 3.2  --    Liver Function Tests:  Recent Labs Lab 01/08/14 2340 01/09/14 0612  AST 62* 62*  ALT 18 14  ALKPHOS 444* 430*  BILITOT 6.1* 5.2*  PROT 5.4* 5.4*  ALBUMIN 1.5* 1.5*    Recent Labs Lab 01/03/14 2115  LIPASE 116*    Recent Labs Lab 01/04/14 0100 01/04/14 0730  AMMONIA 127* 161*   CBC:  Recent Labs Lab 01/08/14 0656 01/08/14 2340 01/09/14 1015  WBC 9.4 10.8* 8.6  NEUTROABS 7.3  --  6.8  HGB 8.0* 7.5* 7.0*  HCT 23.9* 22.5* 20.9*  MCV 108.1* 104.2* 105.0*  PLT 140* 132* 125*   Cardiac Enzymes:  Recent Labs Lab 01/08/14 2340 01/09/14 0612  CKTOTAL  --  59  TROPONINI 0.34* <0.30   CBG:  Recent  Labs Lab 01/08/14 0757 01/08/14 1134 01/08/14 1632 01/08/14 2142 01/09/14 0741 01/09/14 1152  GLUCAP 154* 107* 148* 207* 169* 252*   Coagulation:  Recent Labs Lab 01/04/14 0730 01/08/14 0925  LABPROT 12.4 12.0  INR 0.92 0.89   Anemia Panel:  Recent Labs Lab 01/04/14 0730  VITAMINB12 564  FERRITIN 611*  TIBC 180*  IRON 90   Urine Drug Screen: Drugs of Abuse     Component Value Date/Time   LABOPIA NONE DETECTED 01/03/2014 2311   LABOPIA NEG 11/13/2013 1516   COCAINSCRNUR NONE DETECTED 01/03/2014 2311   COCAINSCRNUR NEG 11/13/2013 1516   LABBENZ NONE DETECTED 01/03/2014 2311   LABBENZ NEG 11/13/2013 1516   AMPHETMU NONE DETECTED 01/03/2014 2311   THCU NONE DETECTED 01/03/2014 2311   LABBARB NONE DETECTED  01/03/2014 2311   LABBARB NEG 11/13/2013 1516   Urinalysis:  Recent Labs Lab 01/03/14 2311 01/06/14 1141  COLORURINE YELLOW ORANGE*  LABSPEC 1.010 1.021  PHURINE 6.0 5.5  GLUCOSEU 100* 100*  HGBUR NEGATIVE NEGATIVE  BILIRUBINUR NEGATIVE LARGE*  KETONESUR NEGATIVE NEGATIVE  PROTEINUR >300* >300*  UROBILINOGEN 1.0 1.0  NITRITE NEGATIVE NEGATIVE  LEUKOCYTESUR NEGATIVE TRACE*    Micro Results: Recent Results (from the past 240 hour(s))  MRSA PCR SCREENING     Status: Abnormal   Collection Time    01/04/14  4:35 AM      Result Value Ref Range Status   MRSA by PCR POSITIVE (*) NEGATIVE Final   Comment:            The GeneXpert MRSA Assay (FDA     approved for NASAL specimens     only), is one component of a     comprehensive MRSA colonization     surveillance program. It is not     intended to diagnose MRSA     infection nor to guide or     monitor treatment for     MRSA infections.     RESULT CALLED TO, READ BACK BY AND VERIFIED WITH:     R.ZELLNER,RN [id number] 01/04/14 M.CAMPBELL  CULTURE, BLOOD (ROUTINE X 2)     Status: None   Collection Time    01/05/14  3:30 PM      Result Value Ref Range Status   Specimen Description BLOOD LEFT ARM   Final   Special Requests     Final   Value: BOTTLES DRAWN AEROBIC AND ANAEROBIC 10CC BLUE, 5CC RED   Culture  Setup Time     Final   Value: 01/05/2014 22:48     Performed at [facility]   Culture     Final   Value: STAPHYLOCOCCUS AUREUS     Note: RIFAMPIN AND GENTAMICIN SHOULD NOT BE USED AS SINGLE DRUGS FOR TREATMENT OF STAPH INFECTIONS.     Note: Gram Stain Report Called to,Read Back By and Verified With: Volanda NapoleonNikki Faucette RN on 01/06/14 at 06:45 by Christie NottinghamAnne Skeen     Performed at [facility]   Report Status 01/08/2014 FINAL   Final   Organism ID, Bacteria STAPHYLOCOCCUS AUREUS   Final  CULTURE, BLOOD (ROUTINE X 2)     Status: None   Collection Time    01/05/14  3:42 PM      Result Value Ref Range Status   Specimen  Description BLOOD BLOOD LEFT FOREARM   Final   Special Requests BOTTLES DRAWN AEROBIC ONLY 5CC   Final   Culture  Setup Time     Final  Value: 01/05/2014 22:48     Performed at [facility]   Culture     Final   Value: STAPHYLOCOCCUS AUREUS     Note: SUSCEPTIBILITIES PERFORMED ON PREVIOUS CULTURE WITHIN THE LAST 5 DAYS.     Note: Gram Stain Report Called to,Read Back By and Verified With: Volanda Napoleon RN on 01/06/14 at 06:45 by Christie Nottingham     Performed at [facility]   Report Status 01/08/2014 FINAL   Final  CULTURE, BLOOD (ROUTINE X 2)     Status: None   Collection Time    01/06/14  3:35 PM      Result Value Ref Range Status   Specimen Description BLOOD LEFT ANTECUBITAL   Final   Special Requests     Final   Value: BOTTLES DRAWN AEROBIC AND ANAEROBIC 10CC AER 5CC ANA   Culture  Setup Time     Final   Value: 01/06/2014 19:02     Performed at [facility]   Culture     Final   Value: STAPHYLOCOCCUS AUREUS     Note: RIFAMPIN AND GENTAMICIN SHOULD NOT BE USED AS SINGLE DRUGS FOR TREATMENT OF STAPH INFECTIONS.     Note: Gram Stain Report Called to,Read Back By and Verified With: Junius Argyle 01/08/14 AT 0100 RIDK     Performed at [facility]   Report Status PENDING   Incomplete  CULTURE, BLOOD (ROUTINE X 2)     Status: None   Collection Time    01/06/14  3:45 PM      Result Value Ref Range Status   Specimen Description BLOOD LEFT HAND   Final   Special Requests     Final   Value: BOTTLES DRAWN AEROBIC AND ANAEROBIC 10CC AER 5CC ANA   Culture  Setup Time     Final   Value: 01/06/2014 19:00     Performed at [facility]   Culture     Final   Value:        BLOOD CULTURE RECEIVED NO GROWTH TO DATE CULTURE WILL BE HELD FOR 5 DAYS BEFORE ISSUING A FINAL NEGATIVE REPORT     Performed at [facility]   Report Status PENDING   Incomplete  CULTURE, BLOOD (ROUTINE X 2)     Status: None   Collection Time    01/08/14  6:56 AM       Result Value Ref Range Status   Specimen Description BLOOD LEFT HAND   Final   Special Requests BOTTLES DRAWN AEROBIC AND ANAEROBIC 10CC   Final   Culture  Setup Time     Final   Value: 01/08/2014 10:12     Performed at [facility]   Culture     Final   Value:        BLOOD CULTURE RECEIVED NO GROWTH TO DATE CULTURE WILL BE HELD FOR 5 DAYS BEFORE ISSUING A FINAL NEGATIVE REPORT     Performed at [facility]   Report Status PENDING   Incomplete  CULTURE, BLOOD (ROUTINE X 2)     Status: None   Collection Time    01/08/14  7:05 AM      Result Value Ref Range Status   Specimen Description BLOOD BLOOD LEFT FOREARM   Final   Special Requests BOTTLES DRAWN AEROBIC ONLY [facility]   Final   Culture  Setup Time     Final   Value: 01/08/2014 10:12  Performed at [facility]     Final   Value:        BLOOD CULTURE RECEIVED NO GROWTH TO DATE CULTURE WILL BE HELD FOR 5 DAYS BEFORE ISSUING A FINAL NEGATIVE REPORT     Performed at [facility]   Report Status PENDING   Incomplete   Studies/Results: US Abdomen Complete  01/08/2014   CLINICAL DATA:  Right upper quadrant pain with elevated hepatic function studies and history of cirrhosis, HIV and diabetes. Evaluate for ascites.  EXAM: ULTRASOUND ABDOMEN COMPLETE  COMPARISON:  Renal ultrasound of December 18, 2013  FINDINGS: Gallbladder:  The gallbladder is adequately distended with no evidence of stones. There is no positive sonographic Murphy's sign. There is no pericholecystic fluid.  Common bile duct:  Diameter: 3.3 mm  Liver:  There is no focal mass or ductal dilation. Portal venous flow is normal in direction toward the liver.  IVC:  No abnormality visualized.  Pancreas:  Visualized portion unremarkable.  Spleen:  The spleen exhibits normal echotexture and measures 9.5 cm in greatest dimension  Right Kidney:  Length: 10.1 cm. The echotexture of the right kidney is increased. There is no focal mass or hydronephrosis.   Left Kidney:  Length: 10.9 cm. The cortical echotexture of the left kidney is also mildly increased. There is no hydronephrosis or focal mass. 2.5 cm  Abdominal aorta:  No aneurysm visualized.  Other findings:  No ascites is demonstrated.  IMPRESSION: 1. Increased echotexture both kidneys is consistent with medical renal disease. There is no hydronephrosis. 2. The liver, gallbladder, pancreas, and common bile duct exhibit no acute abnormalities. 3. No ascites is demonstrated.   Electronically Signed   By: David  [place]   On: 01/08/2014 14:41   Medications:  Scheduled Meds: . abacavir  300 mg Oral BID  . acyclovir  800 mg Oral Daily  . aspirin EC  81 mg Oral Daily  .  ceFAZolin (ANCEF) IV  2 g Intravenous Q12H  . Chlorhexidine Gluconate Cloth  6 each Topical Q0600  . cloNIDine  0.3 mg Oral TID  . Darunavir Ethanolate  800 mg Oral Q breakfast  . enoxaparin (LOVENOX) injection  30 mg Subcutaneous Q24H  . hydrALAZINE  50 mg Oral 3 times per day  . hydrocerin   Topical BID  . insulin aspart  0-9 Units Subcutaneous TID WC  . insulin glargine  20 Units Subcutaneous QHS  . labetalol  600 mg Oral BID  . lacosamide  100 mg Oral BID  . lactulose  20 g Oral TID  . lamiVUDine  50 mg Oral Daily  . levETIRAcetam  500 mg Oral BID  . magic mouthwash w/lidocaine  5 mL Oral TID  . pantoprazole  40 mg Oral Daily  . ritonavir  100 mg Oral Q breakfast  . sodium bicarbonate  650 mg Oral BID  . sodium chloride  3 mL Intravenous Q12H   Continuous Infusions:  PRN Meds:.acetaminophen, hydrOXYzine, menthol-cetylpyridinium, ondansetron (ZOFRAN) IV, ondansetron Assessment/Plan: Principal Problem:   Sepsis due to Staphylococcus aureus Active Problems:   HIV disease   CKD (chronic kidney disease) stage 4, GFR 15-29 ml/min   Diabetes   HCV (hepatitis C virus)   Anemia   Metabolic acidosis, increased anion gap   Altered mental state   Hepatic encephalopathy   Ascites   Fever, unspecified  Assessment:  Ms.  Kohlman is a 53 year old woman with a PMH significant for HIV and Hep C  coinfection complicated by cirrhotic liver, CKD stage IV, stroke, CHF, DMII who was admitted after being brought to the ED by family due to increased lethargy due to hepatic encephalopathy 2/2 Staph aureus bacteremia. She is now on IV cefazolin day 3.   Sepsis due to Methicillin-sensitive Staphylococcus aureus: She has been afebrile for one day with a Tmax of 100F. She has a stable BP and is at baseline mental status. Blood cultures from 8/9 grew Methicillin-sensitive Staphylococcus aurerus 2/2 vials in less than 24 hours. She was started on Vancomycin treatment on 8/10 morning and cefazolin on 8/10 afternoon. Blood cultures from 8/10 has grown gram positive cocci in clusters (1/2), which is expected due to Ms. Michna being on vancomycin and cefazolin for less than 24 hours at the time of the draw. Her portacath was removed on 8/11, and is the likely source of her infection. Leukocytosis is downtrending and is wnl now at 8.6. TTE results from 8/12 show no signs of endocarditis. A TEE is schedule for 8/14.  Will trend fever curve. -Continue IV cefazolin  -TEE scheduled for 8/14  -Continue monitoring for s/s of SBP   History of seizure: Patient had two episodes of seizure-like activity last night. First episode is likely pseudoseizure given the patient was AAO x3, conversant, and was not confused after the episode. Second episode, patient had urinary incontinence and was given Keppra 1000mg  IV. Patient was taking Dilantin at home, but this level was supertherapeutic on admission at a corrected level per pharmacy at 25. Dilantin was initially held to achieve therapeutic dose, but when her Creatinine increased to 3.3 we discontinued it and started lacosamide 100mg  bid as a second agent. She is also taking Keppra 500mg  BID. Patient's home dose is 1500mg  BID, but due to hospital course (see above), patient's Keppra has been renally dosed.  -Neuro  consulted. Appreciate recommendations. -Will continue on Keppra 500mg  BID and lacosamide 100mg  BID until neuro recs  Acute on Chronic Renal Failure : Cr declining from 3.44 to 2.83 today. Initial Cr bump likely due to previously being on ACEi, furosemide, Vanc, and NPO state with no IVF. Expecting Cr to continue to improve.  -Strict in and outs  -Will consider giving Lasix 20mg  po (low dose to prevent Cr bump) if O2 sats decline  -Holding lisinopril   HTN: BP normotensive today at 130/69 after resuming her anti-hypertensive medications. -Will continue hydralazine 50 mg q8h -labetolol 600mg  BID -Will hold furosemide due to creatinine and bacteremia   Chronic Anemia: Likely anemia of chronic disease. Her baseline Hg is ~8. Two weeks ago her hemoglobin was lower around 6.7; today her Hg is at 7. This is downtrending, so will closely monitor and repeat CBC and smear for tmrw morning. -CBC and hemolysis panel  Hyperbilirubinemia: Bilirubin peaked to 6.1 but is trending down and is at 5.2 today. Likely due to cholestasis post-portacath removal. U/s on 8/12 showed increased echotexture of both kidneys consistent with medical renal disease, but no acute abnormalities or ascites. The liver, gallbladder, pancreas, and common bile duct exhibit no  acute abnormalities. -Will continue to trend  Hepatic Encephalopathy: Resolved. She arrived lethargic which was concerning for acute hepatic encephalopathy. Ammonia level on admission was 127 and increased to 161, but she is back to her baseline mental status.  -Continue home lactulose PO PRN titrated to 3BM/day  -Diet heart healthy/ Carb mod only if fully alert   Ascites: Soft and obese abdomen, only mildly distended and not tense. CT abdomen/pelvis on  presentation with only mild ascities, no acute intraabdominal process. She denies increased abdominal distention and continues to be at baseline mental status. Not concerned for SBP at this time. U/s on 8/12  showed no ascites. -Will consider diagnostic/therapeutic tap of ascitic fluid if she declines  -CMP in am   Hypokalemia: Resolved. K at 4.0 today.   Hepatitis C: Genotype in December 2014. Genotype 1b, viral load of [id number], Log of 6.63.  -She will f/u with Dr. Drue Second in ID for possible treatment of Hep C   HIV disease: Well controlled with viral load undectable in 09/08/13. CD4 count in April at 280. 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 Secondary to Hepatitis C infection. 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 Platelets and INR stable.   DMII: Last Hg A1C 7.5% on 11/13/13. On Lantus 30 u qHS and Novolog 10-25 u actid at home.  -Lantus 20 units qHS  -Sensitive SSI   DVT prophylaxis: SCDs   Diet:  HH, will be NPO after midnight on 8/13 for TEE on 8/14   Dispo: Disposition is deferred, but will likely need SNF placement to receive IV antibiotics.    This is a Psychologist, occupational Note.  The care of the patient was discussed with Dr. Virgina Organ and the assessment and plan formulated with their assistance.  Please see their attached note for official documentation of the daily encounter.   LOS: 6 days   Lillia Carmel, Med Student 01/09/2014, 12:18 PM

## 2014-01-09 NOTE — Progress Notes (Signed)
Patient had another episode of seizure around 19:00 o'clock; MD is aware about these episodes and patient had neurology doctor saw her today. No injuries noted, no complaints of pain. Will continue to monitr.

## 2014-01-09 NOTE — Progress Notes (Signed)
Subjective: Michelle [name] was seen and examined at bedside this morning. Several family members were present in the room as well. She was noted to have several seizures overnight but feels good today and hungry. She denies CP, SOB, or abdominal pain this morning.   Objective: Vital signs in last 24 hours: Filed Vitals:   01/08/14 1804 01/08/14 1806 01/09/14 0304 01/09/14 0546  BP: 175/85 185/97 133/70 134/67  Pulse:  108 83 81  Temp:   98.6 F (37 C) 98.8 F (37.1 C)  TempSrc:   Oral Oral  Resp:   20 22  Height:      Weight:      SpO2:   100% 98%   Weight change:   Intake/Output Summary (Last 24 hours) at 01/09/14 1223 Last data filed at 01/09/14 0900  Gross per 24 hour  Intake   1112 ml  Output    300 ml  Net    812 ml   Vital signs reviewed  Gen: Sitting up in bed, NAD HEENT: EOMI CV: RRR Resp: faint b/l crackles but poor effort for examination Abd: bowel sounds present, non-tender, soft Ext: moving all 4 extremities, -tenderness to palpation, darkened patches of skin, dry skin Neuro: Alert and oriented x3, stutters  Lab Results: Basic Metabolic Panel:  Recent Labs Lab 01/08/14 2340 01/09/14 0612  NA 136* 140  K 3.5* 4.0  CL 103 108  CO2 16* 17*  GLUCOSE 256* 197*  BUN 49* 51*  CREATININE 2.77* 2.83*  CALCIUM 7.2* 7.2*  MG 1.7  --   PHOS 3.2  --    Liver Function Tests:  Recent Labs Lab 01/08/14 2340 01/09/14 0612  AST 62* 62*  ALT 18 14  ALKPHOS 444* 430*  BILITOT 6.1* 5.2*  PROT 5.4* 5.4*  ALBUMIN 1.5* 1.5*    Recent Labs Lab 01/03/14 2115  LIPASE 116*    Recent Labs Lab 01/04/14 0100 01/04/14 0730  AMMONIA 127* 161*   CBC:  Recent Labs Lab 01/08/14 0656 01/08/14 2340 01/09/14 1015  WBC 9.4 10.8* 8.6  NEUTROABS 7.3  --  6.8  HGB 8.0* 7.5* 7.0*  HCT 23.9* 22.5* 20.9*  MCV 108.1* 104.2* 105.0*  PLT 140* 132* 125*   CBG:  Recent Labs Lab 01/08/14 0757 01/08/14 1134 01/08/14 1632 01/08/14 2142 01/09/14 0741  01/09/14 1152  GLUCAP 154* 107* 148* 207* 169* 252*    Coagulation:  Recent Labs Lab 01/04/14 0730 01/08/14 0925  LABPROT 12.4 12.0  INR 0.92 0.89   Anemia Panel:  Recent Labs Lab 01/04/14 0730  VITAMINB12 564  FERRITIN 611*  TIBC 180*  IRON 90   Urine Drug Screen: Drugs of Abuse     Component Value Date/Time   LABOPIA NONE DETECTED 01/03/2014 2311   LABOPIA NEG 11/13/2013 1516   COCAINSCRNUR NONE DETECTED 01/03/2014 2311   COCAINSCRNUR NEG 11/13/2013 1516   LABBENZ NONE DETECTED 01/03/2014 2311   LABBENZ NEG 11/13/2013 1516   AMPHETMU NONE DETECTED 01/03/2014 2311   THCU NONE DETECTED 01/03/2014 2311   LABBARB NONE DETECTED 01/03/2014 2311   LABBARB NEG 11/13/2013 1516     Recent Labs Lab 01/03/14 2311 01/06/14 1141  COLORURINE YELLOW ORANGE*  LABSPEC 1.010 1.021  PHURINE 6.0 5.5  GLUCOSEU 100* 100*  HGBUR NEGATIVE NEGATIVE  BILIRUBINUR NEGATIVE LARGE*  KETONESUR NEGATIVE NEGATIVE  PROTEINUR >300* >300*  UROBILINOGEN 1.0 1.0  NITRITE NEGATIVE NEGATIVE  LEUKOCYTESUR NEGATIVE TRACE*    Micro Results: Recent Results (from the past 240 hour(s))  MRSA PCR SCREENING     Status: Abnormal   Collection Time    01/04/14  4:35 AM      Result Value Ref Range Status   MRSA by PCR POSITIVE (*) NEGATIVE Final   Comment:            The GeneXpert MRSA Assay (FDA     approved for NASAL specimens     only), is one component of a     comprehensive MRSA colonization     surveillance program. It is not     intended to diagnose MRSA     infection nor to guide or     monitor treatment for     MRSA infections.     RESULT CALLED TO, READ BACK BY AND VERIFIED WITH:     R.ZELLNER,RN [id number] 01/04/14 M.CAMPBELL  CULTURE, BLOOD (ROUTINE X 2)     Status: None   Collection Time    01/05/14  3:30 PM      Result Value Ref Range Status   Specimen Description BLOOD LEFT ARM   Final   Special Requests     Final   Value: BOTTLES DRAWN AEROBIC AND ANAEROBIC 10CC BLUE, 5CC RED   Culture   Setup Time     Final   Value: 01/05/2014 22:48     Performed at [facility]   Culture     Final   Value: STAPHYLOCOCCUS AUREUS     Note: RIFAMPIN AND GENTAMICIN SHOULD NOT BE USED AS SINGLE DRUGS FOR TREATMENT OF STAPH INFECTIONS.     Note: Gram Stain Report Called to,Read Back By and Verified With: Volanda Napoleon RN on 01/06/14 at 06:45 by Christie Nottingham     Performed at [facility]   Report Status 01/08/2014 FINAL   Final   Organism ID, Bacteria STAPHYLOCOCCUS AUREUS   Final  CULTURE, BLOOD (ROUTINE X 2)     Status: None   Collection Time    01/05/14  3:42 PM      Result Value Ref Range Status   Specimen Description BLOOD BLOOD LEFT FOREARM   Final   Special Requests BOTTLES DRAWN AEROBIC ONLY 5CC   Final   Culture  Setup Time     Final   Value: 01/05/2014 22:48     Performed at [facility]   Culture     Final   Value: STAPHYLOCOCCUS AUREUS     Note: SUSCEPTIBILITIES PERFORMED ON PREVIOUS CULTURE WITHIN THE LAST 5 DAYS.     Note: Gram Stain Report Called to,Read Back By and Verified With: Volanda Napoleon RN on 01/06/14 at 06:45 by Christie Nottingham     Performed at [facility]   Report Status 01/08/2014 FINAL   Final  CULTURE, BLOOD (ROUTINE X 2)     Status: None   Collection Time    01/06/14  3:35 PM      Result Value Ref Range Status   Specimen Description BLOOD LEFT ANTECUBITAL   Final   Special Requests     Final   Value: BOTTLES DRAWN AEROBIC AND ANAEROBIC 10CC AER 5CC ANA   Culture  Setup Time     Final   Value: 01/06/2014 19:02     Performed at [facility]   Culture     Final   Value: STAPHYLOCOCCUS AUREUS     Note: RIFAMPIN AND GENTAMICIN SHOULD NOT BE USED AS SINGLE DRUGS FOR TREATMENT OF STAPH INFECTIONS.     Note: Gram Stain  Report Called to,Read Back By and Verified With: Junius Argyle 01/08/14 AT 0100 RIDK     Performed at [facility]   Report Status PENDING   Incomplete  CULTURE, BLOOD (ROUTINE X 2)     Status: None    Collection Time    01/06/14  3:45 PM      Result Value Ref Range Status   Specimen Description BLOOD LEFT HAND   Final   Special Requests     Final   Value: BOTTLES DRAWN AEROBIC AND ANAEROBIC 10CC AER 5CC ANA   Culture  Setup Time     Final   Value: 01/06/2014 19:00     Performed at [facility]   Culture     Final   Value:        BLOOD CULTURE RECEIVED NO GROWTH TO DATE CULTURE WILL BE HELD FOR 5 DAYS BEFORE ISSUING A FINAL NEGATIVE REPORT     Performed at [facility]   Report Status PENDING   Incomplete  CULTURE, BLOOD (ROUTINE X 2)     Status: None   Collection Time    01/08/14  6:56 AM      Result Value Ref Range Status   Specimen Description BLOOD LEFT HAND   Final   Special Requests BOTTLES DRAWN AEROBIC AND ANAEROBIC 10CC   Final   Culture  Setup Time     Final   Value: 01/08/2014 10:12     Performed at [facility]   Culture     Final   Value:        BLOOD CULTURE RECEIVED NO GROWTH TO DATE CULTURE WILL BE HELD FOR 5 DAYS BEFORE ISSUING A FINAL NEGATIVE REPORT     Performed at [facility]   Report Status PENDING   Incomplete  CULTURE, BLOOD (ROUTINE X 2)     Status: None   Collection Time    01/08/14  7:05 AM      Result Value Ref Range Status   Specimen Description BLOOD BLOOD LEFT FOREARM   Final   Special Requests BOTTLES DRAWN AEROBIC ONLY [facility]   Final   Culture  Setup Time     Final   Value: 01/08/2014 10:12     Performed at [facility]   Culture     Final   Value:        BLOOD CULTURE RECEIVED NO GROWTH TO DATE CULTURE WILL BE HELD FOR 5 DAYS BEFORE ISSUING A FINAL NEGATIVE REPORT     Performed at [facility]   Report Status PENDING   Incomplete   Studies/Results: US Abdomen Complete  01/08/2014   CLINICAL DATA:  Right upper quadrant pain with elevated hepatic function studies and history of cirrhosis, HIV and diabetes. Evaluate for ascites.  EXAM: ULTRASOUND ABDOMEN COMPLETE  COMPARISON:  Renal ultrasound of  December 18, 2013  FINDINGS: Gallbladder:  The gallbladder is adequately distended with no evidence of stones. There is no positive sonographic Murphy's sign. There is no pericholecystic fluid.  Common bile duct:  Diameter: 3.3 mm  Liver:  There is no focal mass or ductal dilation. Portal venous flow is normal in direction toward the liver.  IVC:  No abnormality visualized.  Pancreas:  Visualized portion unremarkable.  Spleen:  The spleen exhibits normal echotexture and measures 9.5 cm in greatest dimension  Right Kidney:  Length: 10.1 cm. The echotexture of the right kidney is increased. There is no focal mass or hydronephrosis.  Left Kidney:  Length: 10.9 cm. The cortical echotexture of the left kidney is also mildly increased. There is no hydronephrosis or focal mass. 2.5 cm  Abdominal aorta:  No aneurysm visualized.  Other findings:  No ascites is demonstrated.  IMPRESSION: 1. Increased echotexture both kidneys is consistent with medical renal disease. There is no hydronephrosis. 2. The liver, gallbladder, pancreas, and common bile duct exhibit no acute abnormalities. 3. No ascites is demonstrated.   Electronically Signed   By: David  [place]   On: 01/08/2014 14:41   Medications: I have reviewed the patient's current medications. Scheduled Meds: . abacavir  300 mg Oral BID  . acyclovir  800 mg Oral Daily  . aspirin EC  81 mg Oral Daily  .  ceFAZolin (ANCEF) IV  2 g Intravenous Q12H  . Chlorhexidine Gluconate Cloth  6 each Topical Q0600  . cloNIDine  0.3 mg Oral TID  . Darunavir Ethanolate  800 mg Oral Q breakfast  . enoxaparin (LOVENOX) injection  30 mg Subcutaneous Q24H  . hydrALAZINE  50 mg Oral 3 times per day  . hydrocerin   Topical BID  . insulin aspart  0-9 Units Subcutaneous TID WC  . insulin glargine  20 Units Subcutaneous QHS  . labetalol  600 mg Oral BID  . lacosamide  100 mg Oral BID  . lactulose  20 g Oral TID  . lamiVUDine  50 mg Oral Daily  . levETIRAcetam  500 mg Oral BID  . magic  mouthwash w/lidocaine  5 mL Oral TID  . pantoprazole  40 mg Oral Daily  . ritonavir  100 mg Oral Q breakfast  . sodium bicarbonate  650 mg Oral BID  . sodium chloride  3 mL Intravenous Q12H   Continuous Infusions:  PRN Meds:.acetaminophen, hydrOXYzine, menthol-cetylpyridinium, ondansetron (ZOFRAN) IV, ondansetron Assessment/Plan: Ms. 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   Sepsis due to MSSA bacteremia: Sepsis improving with IV antibiotics, s/p port-a-cath removal 8/11 thought to be the source. Tmax 102.51F on 8/9 this admission, now afebrile, but Tmax of 100.47F @7AM  yesterday and leukocytosis resolved (down to 8.6 today).  BC from 8/9 grew MSSA  2/2 vials. Today is day 4 of Cefazolin.  LFTs trending down.  -Needs TEE, appreciate cardiology following, hopefully tomorrow if she can remain NPO -Continue cefazolin  -Monitor fever curve -AM CBC  Acute on Chronic Renal Failure : Cr slightly up to 2.83 from 2.77 yesterday. Baseline ~2.2.  Cr trended up to 3.44 this admission.  This morning's increase may be secondary to seizures yesterday and additionally, she did receive one dose of ACEi overnight as well. She has also been on Vancomycin this admission and diuretics.   Recent Labs Lab 01/06/14 0805 01/07/14 0216 01/08/14 0656 01/08/14 2340 01/09/14 0612  CREATININE 2.70* 3.44* 3.01* 2.77* 2.83*   -Strict in and outs and daily weights -Trend renal function -Discontinued ACEi -Holding Lasix  Hepatic Encephalopathy: Resolved. She arrived lethargic which was concerning for acute hepatic encephalopathy.  -Continue home lactulose PO PRN titrated to 3BM/day  -Diet heart healthy/ Carb mod  Ascites: Soft and obese abdomen. CT abdomen/pelvis on presentation with only mild ascities, no acute intraabdominal process. She had mild RUQ pain today but  [place] Abd was unremarkable for acute gall bladder disease. Not concerned for SBP at this time. Bilirubin trending down to 5.2  today.  -Will consider diagnostic/therapeutic tap of ascitic fluid if clinically declines  -Trend CMET  Seizure's: multiple overnight, none today so far.  She was taking Dilantin at home but medications have been changed this admission including discontinuation of Dilantin, starting Keppra but adjusting dose based on renal function, and started Vimpat.  Thus, seizures overnight could be secondary to all the medication adjustments. Dilantin level 12.2 8/9.  -Will ask for formal neurology consult to day for further assistance of medication and seizure management -Continue Vimpat and Keppra for now and adjust dosing with help of pharmacy and neurology  -Seizure precautions, if recurrent, will likely have to transfer to SDU -Ativan prn for seizures  Hypokalemia: Resolved, K 4.0 today.     Hepatitis C: Genotype in December 2014. Genotype 1b, viral load of [id number], Log of 6.63.  -She will f/u with Dr. Drue SecondSnider in ID for possible treatment of Hep C as outpatient  HIV disease: Well controlled with viral load undectable in 09/08/13. CD4 count in April at 280.  -CD4 and viral load labs were listed to be checked this admission but not done so yet, will order for AM labs today  -Continue home abacavir, prezista, norvir, and epivir  -Continue acyclovir for prophylaxis   HTN: Elevated today in setting of holding antihypertensives last night.  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 Secondary to Hepatitis C infection. 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 Platelets trending down to 125 today from 171 on admission. IRN 0.89 today.  -monitor cbc and INR  DM2: Last Hg A1C  7.5% on 11/13/13. On Lantus 30 u qHS and Novolog 10-25u at home.  -Lantus 20 units qHS  -Sensitive SSI, could increase if eating regular meals and no longer NPO tomorrow  Macrocytic Anemia, chronic since April 2015:Her baseline Hg is ~8. Two weeks ago her hemoglobin was lower around 6.7. Down to 7 today from 7.5 yesterday and up to 8.4 this admission. Vitamin b12 was 564 on 8/8. Folate elevated 1093. Iron panel 8/8: iron 90, TIBC 180, and Ferritin 611.  -No obvious source of bleeding at this time, but consider hemolysis (LDH was elevated 8/10) work up and FOBT if continues to trend down; although work up has been done on prior admissions and thought to be chronic and recommended for Aranesp in the past.  -Trend Hb  DVT prophylaxis: SCDs  Diet:  HH, Carb mod, NPO after midnight if TEE tomorrow Dispo: Disposition is deferred at this time, awaiting improvement of current medical problems. Anticipated discharge in approximately 2-3 day(s).   The patient does have a current PCP Christen Bame(Nora Sadek, MD) and [facility].  The patient does have transportation limitations that hinder transportation to clinic appointments.   Services Needed at time of discharge: Y = Yes, Blank = No PT: Yes--SNF if sister is not able to assist  OT: Yes  RN:   Equipment:   Other:     LOS: 6 days   Baltazar ApoSamaya J Charlene Detter, MD 01/09/2014, 12:23 PM

## 2014-01-09 NOTE — Progress Notes (Signed)
S: was called that pt was calling out in anticipation of seizure. Went to eval pt. No seizure activity and pt had no amnesia. Pt states "I want to see my dr and I feel they are playing with my medicine and I want it to be back to the 2x per day." Pt has no complaints.   O:  Filed Vitals:   01/09/14 1848  BP: 146/64  Pulse: 90  Temp:   Resp: 22   General: resting in bed, NAD  HEENT: PERRL, EOMI, slight scleral icterus Cardiac: RRR, no rubs, murmurs or gallops Pulm: clear to auscultation bilaterally, moving normal volumes of air Abd: soft, nontender, distended, BS present Ext: warm and well perfused, no pedal edema Neuro: alert and oriented X3, cranial nerves II-XII grossly intact  A/P:  Pt complaining of feeling like having a seizure. There appears to have been no seizure activity. Unclear if secondary gain is at play given episode of psychogenic seizure yesterday bcz as provider leaves pt states "you have to visit me every night or I can just have a seizure and I trust you so you have to come." Pt didn't have any urinary or stool incontinence, was speaking and dictating movements with the RN (nothing with this provider in room).  -0.5mg  ativan was given  -will continue to monitor - if event happens will contact neuro in terms of management in light of med changes and renal/hepatic comorbidities

## 2014-01-09 NOTE — Progress Notes (Signed)
Patient just had an episode of seizure; BP=152/75; patient alert and oriented, able to verbalize her needs;  No injuries noted.
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
## 2014-01-09 NOTE — Consult Note (Signed)
NEURO HOSPITALIST CONSULT NOTE    Reason for Consult: recurrent seizures. AED management recommendations   HPI:                                                                                                                                          Michelle [name] is an 53 y.o. female with a pat medical history significant for HTN, hypercholesterolemia, chronic congestive heart failure, HIV, meningitis, syphilis, hepatitis C, seizures (apparently mixed seizure disorder), admitted to [facility] due to sepsis due to MSSA bacteremia, acute and chronic renal failure, and hepatic encephalopathy (resolved). Neurology is consulted as patient had seizures last night and this morning while on dual AED regimen with vimpat 100 mg BID and keppra 500 mg BID, adjusted dose due to renal insufficiency. Dilantin was discontinued during this admission. Review of last night events indicated concern that one of her seizure was a pseudoseizure. In any event, she said that she usually gets " a warm feeling" before having a seizure, and she has been told that at times she can have a seizure lasting up to 10 or 15 minutes. Denies HA, vertigo, double vision, difficulty swallowing, focal weakness, slurred speech, language or vision disturbances. Prior work up includes EEG that was reported as abnormal with frequent epileptiform discharges from the left posterior quadrant.  Past Medical History  Diagnosis Date  . Stroke   . Meningitis   . HIV (human immunodeficiency virus infection) 1981  . Hypertension   . Gout   . Muscle spasms of head and/or neck   . CHF (congestive heart failure)     [name] 06/18/2013  . HCV (hepatitis C virus)     chronic/notes 06/18/2013  . Type II diabetes mellitus     [name] 06/18/2013  . AIHA (autoimmune hemolytic anemia)     [name] 06/18/2013  . Hypertensive encephalopathy ~ 05/2013    hospitalaized/notes 06/18/2013  . Exertional shortness of breath     [name] 06/18/2013  .  Anxiety     [name] 06/18/2013  . History of syphilis   . High cholesterol   . CKD (chronic kidney disease), stage III   . Nephrotic syndrome   . Cirrhosis     hepatitis C   . GERD (gastroesophageal reflux disease)   . Migraine     "all the time" (11/27/2013)  . Seizures     "last sz was week before last" (11/27/2013)  . Psychosis   . Chronic venous stasis dermatitis of both lower extremities     Past Surgical History  Procedure Laterality Date  . Hip pinning Right 1980's  . Av fistula placement Right 07/24/2013    Procedure: RIGHT arm exploration of antecubital space;  Surgeon: Elam Dutch, MD;  Location: [place];  Service: Vascular;  Laterality: Right;  . Portacath placement  11/27/2013  . Colonoscopy N/A 12/19/2013    Procedure: COLONOSCOPY;  Surgeon: Gatha Mayer, MD;  Location: [place];  Service: Endoscopy;  Laterality: N/A;  . Esophagogastroduodenoscopy N/A 12/19/2013    Procedure: ESOPHAGOGASTRODUODENOSCOPY (EGD);  Surgeon: Gatha Mayer, MD;  Location[facility] ENDOSCOPY;  Service: Endoscopy;  Laterality: N/A;    Family History  Problem Relation Age of Onset  . Cancer - Colon Mother   . Cancer Father   . Hypertension Father   . Diabetes    . Diabetes Sister     Family History:  Social History:  reports that she quit smoking about 3 years ago. Her smoking use included Cigarettes. She smoked 0.00 packs per day. She has never used smokeless tobacco. She reports that she uses illicit drugs (Cocaine, Marijuana, and "Crack" cocaine). She reports that she does not drink alcohol.  Allergies  Allergen Reactions  . 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 Morphine And Related Hives, Itching and Rash  . Norvasc [Amlodipine Besylate] Hives, Itching and Rash    MEDICATIONS:                                                                                                                     I have reviewed the  patient's current medications.   ROS:                                                                                                                                       History obtained from the patient and chart review.  General ROS: negative for - chills,  night sweats, weight gain  Psychological ROS: negative for - behavioral disorder, hallucinations, memory difficulties Ophthalmic ROS: negative for - blurry vision, double vision, eye pain or loss of vision ENT ROS: negative for - epistaxis, nasal discharge, oral lesions, sore throat, tinnitus or vertigo Allergy and Immunology ROS: negative for - hives or itchy/watery eyes Hematological and Lymphatic ROS: negative for - bleeding problems, bruising or swollen lymph nodes Endocrine ROS: negative for - galactorrhea, hair pattern changes, polydipsia/polyuria or temperature intolerance Respiratory ROS: negative for - cough, hemoptysis, shortness of breath or wheezing Cardiovascular ROS: negative for - chest pain, dyspnea on exertion, edema or irregular heartbeat Gastrointestinal ROS: negative for - abdominal pain, diarrhea, hematemesis, nausea/vomiting or stool incontinence Genito-Urinary ROS:  negative for - dysuria, hematuria, incontinence or urinary frequency/urgency Musculoskeletal ROS: negative for - joint swelling or muscular weakness Neurological ROS: as noted in HPI Dermatological ROS: negative for rash and skin lesion changes  Physical exam: pleasant female in no apparent distress. Blood pressure 130/69, pulse 78, temperature 99.2 F (37.3 C), temperature source Oral, resp. rate 21, height _0  (1.651 m), weight 78.1 kg (172 lb 2.9 oz), SpO2 100.00%. Head: normocephalic. Neck: supple, no bruits, no JVD. Cardiac: no murmurs. Lungs: clear. Abdomen: soft, no tender, no mass. Extremities: no edema. Neurologic Examination:                                                                                                       General: Mental Status: Alert, oriented, thought content appropriate.  Speech fluent without evidence of aphasia.  Able to follow 3 step commands without difficulty. Cranial Nerves: II: Discs flat bilaterally; Visual fields grossly normal, pupils equal, round, reactive to light and accommodation III,IV, VI: ptosis not present, extra-ocular motions intact bilaterally V,VII: smile symmetric, facial light touch sensation normal bilaterally VIII: hearing normal bilaterally IX,X: gag reflex present XI: bilateral shoulder shrug XII: midline tongue extension without atrophy or fasciculations  Motor: Moves all limbs symmetrically Tone and bulk:normal tone throughout; no atrophy noted Sensory: Pinprick and light touch intact throughout, bilaterally Deep Tendon Reflexes:  1+ all over Plantars: Right: downgoing   Left: downgoing Cerebellar: normal finger-to-nose,  normal heel-to-shin test Gait: No tested    Lab Results  Component Value Date/Time   CHOL 185 05/07/2013 10:09 AM    Results for orders placed during the hospital encounter of 01/03/14 (from the past 48 hour(s))  GLUCOSE, CAPILLARY     Status: Abnormal   Collection Time    01/07/14  5:20 PM      Result Value Ref Range   Glucose-Capillary 294 (*) 70 - 99 mg/dL  GLUCOSE, CAPILLARY     Status: Abnormal   Collection Time    01/07/14  7:36 PM      Result Value Ref Range   Glucose-Capillary 204 (*) 70 - 99 mg/dL   Comment 1 Notify RN     Comment 2 Documented in Chart    GLUCOSE, CAPILLARY     Status: Abnormal   Collection Time    01/07/14  9:55 PM      Result Value Ref Range   Glucose-Capillary 179 (*) 70 - 99 mg/dL  GLUCOSE, CAPILLARY     Status: Abnormal   Collection Time    01/07/14 11:44 PM      Result Value Ref Range   Glucose-Capillary 168 (*) 70 - 99 mg/dL   Comment 1 Notify RN     Comment 2 Documented in Chart    GLUCOSE, CAPILLARY     Status: Abnormal   Collection Time    01/08/14  3:48 AM      Result  Value Ref Range   Glucose-Capillary 156 (*) 70 - 99 mg/dL   Comment 1 Notify RN     Comment 2 Documented in Chart  COMPREHENSIVE METABOLIC PANEL     Status: Abnormal   Collection Time    01/08/14  6:56 AM      Result Value Ref Range   Sodium 138  137 - 147 mEq/L   Potassium 3.3 (*) 3.7 - 5.3 mEq/L   Chloride 105  96 - 112 mEq/L   CO2 14 (*) 19 - 32 mEq/L   Glucose, Bld 148 (*) 70 - 99 mg/dL   BUN 55 (*) 6 - 23 mg/dL   Creatinine, Ser 3.01 (*) 0.50 - 1.10 mg/dL   Calcium 7.2 (*) 8.4 - 10.5 mg/dL   Total Protein 5.6 (*) 6.0 - 8.3 g/dL   Albumin 1.6 (*) 3.5 - 5.2 g/dL   AST 79 (*) 0 - 37 U/L   ALT 26  0 - 35 U/L   Alkaline Phosphatase 447 (*) 39 - 117 U/L   Total Bilirubin 5.5 (*) 0.3 - 1.2 mg/dL   GFR calc non Af Amer 17 (*) >90 mL/min   GFR calc Af Amer 19 (*) >90 mL/min   Comment: (NOTE)     The eGFR has been calculated using the CKD EPI equation.     This calculation has not been validated in all clinical situations.     eGFR's persistently <90 mL/min signify possible Chronic Kidney     Disease.   Anion gap 19 (*) 5 - 15  CBC WITH DIFFERENTIAL     Status: Abnormal   Collection Time    01/08/14  6:56 AM      Result Value Ref Range   WBC 9.4  4.0 - 10.5 K/uL   RBC 2.21 (*) 3.87 - 5.11 MIL/uL   Hemoglobin 8.0 (*) 12.0 - 15.0 g/dL   HCT 23.9 (*) 36.0 - 46.0 %   MCV 108.1 (*) 78.0 - 100.0 fL   MCH 36.2 (*) 26.0 - 34.0 pg   MCHC 33.5  30.0 - 36.0 g/dL   RDW 14.1  11.5 - 15.5 %   Platelets 140 (*) 150 - 400 K/uL   Neutrophils Relative % 78 (*) 43 - 77 %   Neutro Abs 7.3  1.7 - 7.7 K/uL   Lymphocytes Relative 11 (*) 12 - 46 %   Lymphs Abs 1.0  0.7 - 4.0 K/uL   Monocytes Relative 11  3 - 12 %   Monocytes Absolute 1.1 (*) 0.1 - 1.0 K/uL   Eosinophils Relative 0  0 - 5 %   Eosinophils Absolute 0.0  0.0 - 0.7 K/uL   Basophils Relative 0  0 - 1 %   Basophils Absolute 0.0  0.0 - 0.1 K/uL  CULTURE, BLOOD (ROUTINE X 2)     Status: None   Collection Time    01/08/14  6:56 AM       Result Value Ref Range   Specimen Description BLOOD LEFT HAND     Special Requests BOTTLES DRAWN AEROBIC AND ANAEROBIC 10CC     Culture  Setup Time       Value: 01/08/2014 10:12     Performed at [facility]   Culture       Value:        BLOOD CULTURE RECEIVED NO GROWTH TO DATE CULTURE WILL BE HELD FOR 5 DAYS BEFORE ISSUING A FINAL NEGATIVE REPORT     Performed at [facility]   Report Status PENDING    CULTURE, BLOOD (ROUTINE X 2)     Status: None   Collection Time  01/08/14  7:05 AM      Result Value Ref Range   Specimen Description BLOOD BLOOD LEFT FOREARM     Special Requests BOTTLES DRAWN AEROBIC ONLY [place]     Culture  Setup Time       Value: 01/08/2014 10:12     Performed at [facility]   Culture       Value:        BLOOD CULTURE RECEIVED NO GROWTH TO DATE CULTURE WILL BE HELD FOR 5 DAYS BEFORE ISSUING A FINAL NEGATIVE REPORT     Performed at [facility]   Report Status PENDING    GLUCOSE, CAPILLARY     Status: Abnormal   Collection Time    01/08/14  7:57 AM      Result Value Ref Range   Glucose-Capillary 154 (*) 70 - 99 mg/dL  BILIRUBIN, FRACTIONATED(TOT/DIR/INDIR)     Status: Abnormal   Collection Time    01/08/14  9:25 AM      Result Value Ref Range   Total Bilirubin 5.4 (*) 0.3 - 1.2 mg/dL   Bilirubin, Direct 4.9 (*) 0.0 - 0.3 mg/dL   Indirect Bilirubin 0.5  0.3 - 0.9 mg/dL  PROTIME-INR     Status: None   Collection Time    01/08/14  9:25 AM      Result Value Ref Range   Prothrombin Time 12.0  11.6 - 15.2 seconds   INR 0.89  0.00 - 1.49  GLUCOSE, CAPILLARY     Status: Abnormal   Collection Time    01/08/14 11:34 AM      Result Value Ref Range   Glucose-Capillary 107 (*) 70 - 99 mg/dL  GLUCOSE, CAPILLARY     Status: Abnormal   Collection Time    01/08/14  4:32 PM      Result Value Ref Range   Glucose-Capillary 148 (*) 70 - 99 mg/dL  GLUCOSE, CAPILLARY     Status: Abnormal   Collection Time    01/08/14  9:42 PM       Result Value Ref Range   Glucose-Capillary 207 (*) 70 - 99 mg/dL  CBC     Status: Abnormal   Collection Time    01/08/14 11:40 PM      Result Value Ref Range   WBC 10.8 (*) 4.0 - 10.5 K/uL   RBC 2.16 (*) 3.87 - 5.11 MIL/uL   Hemoglobin 7.5 (*) 12.0 - 15.0 g/dL   HCT 22.5 (*) 36.0 - 46.0 %   MCV 104.2 (*) 78.0 - 100.0 fL   MCH 34.7 (*) 26.0 - 34.0 pg   MCHC 33.3  30.0 - 36.0 g/dL   RDW 13.7  11.5 - 15.5 %   Platelets 132 (*) 150 - 400 K/uL  COMPREHENSIVE METABOLIC PANEL     Status: Abnormal   Collection Time    01/08/14 11:40 PM      Result Value Ref Range   Sodium 136 (*) 137 - 147 mEq/L   Potassium 3.5 (*) 3.7 - 5.3 mEq/L   Chloride 103  96 - 112 mEq/L   CO2 16 (*) 19 - 32 mEq/L   Glucose, Bld 256 (*) 70 - 99 mg/dL   BUN 49 (*) 6 - 23 mg/dL   Creatinine, Ser 2.77 (*) 0.50 - 1.10 mg/dL   Calcium 7.2 (*) 8.4 - 10.5 mg/dL   Total Protein 5.4 (*) 6.0 - 8.3 g/dL   Albumin 1.5 (*) 3.5 - 5.2 g/dL  AST 62 (*) 0 - 37 U/L   ALT 18  0 - 35 U/L   Alkaline Phosphatase 444 (*) 39 - 117 U/L   Total Bilirubin 6.1 (*) 0.3 - 1.2 mg/dL   GFR calc non Af Amer 18 (*) >90 mL/min   GFR calc Af Amer 21 (*) >90 mL/min   Comment: (NOTE)     The eGFR has been calculated using the CKD EPI equation.     This calculation has not been validated in all clinical situations.     eGFR's persistently <90 mL/min signify possible Chronic Kidney     Disease.   Anion gap 17 (*) 5 - 15  MAGNESIUM     Status: None   Collection Time    01/08/14 11:40 PM      Result Value Ref Range   Magnesium 1.7  1.5 - 2.5 mg/dL  PROLACTIN     Status: None   Collection Time    01/08/14 11:40 PM      Result Value Ref Range   Prolactin 16.3     Comment: (NOTE)         Reference Ranges:                     Female:                       2.1 -  17.1 ng/ml                     Female:   Pregnant          9.7 - 208.5 ng/mL                               Non Pregnant      2.8 -  29.2 ng/mL                               Post  Menopausal   1.8 -  20.3 ng/mL                           Performed at [place]     Status: None   Collection Time    01/08/14 11:40 PM      Result Value Ref Range   Phosphorus 3.2  2.3 - 4.6 mg/dL  TROPONIN I     Status: Abnormal   Collection Time    01/08/14 11:40 PM      Result Value Ref Range   Troponin I 0.34 (*) <0.30 ng/mL   Comment:            Due to the release kinetics of cTnI,     a negative result within the first hours     of the onset of symptoms does not rule out     myocardial infarction with certainty.     If myocardial infarction is still suspected,     repeat the test at appropriate intervals.     CRITICAL RESULT CALLED TO, READ BACK BY AND VERIFIED WITH:     Audelia Hives [id number] [facility]     AT 0030  COMPREHENSIVE METABOLIC PANEL     Status: Abnormal   Collection Time    01/09/14  6:12 AM      Result Value Ref Range  Sodium 140  137 - 147 mEq/L   Potassium 4.0  3.7 - 5.3 mEq/L   Chloride 108  96 - 112 mEq/L   CO2 17 (*) 19 - 32 mEq/L   Glucose, Bld 197 (*) 70 - 99 mg/dL   BUN 51 (*) 6 - 23 mg/dL   Creatinine, Ser 2.83 (*) 0.50 - 1.10 mg/dL   Calcium 7.2 (*) 8.4 - 10.5 mg/dL   Total Protein 5.4 (*) 6.0 - 8.3 g/dL   Albumin 1.5 (*) 3.5 - 5.2 g/dL   AST 62 (*) 0 - 37 U/L   ALT 14  0 - 35 U/L   Alkaline Phosphatase 430 (*) 39 - 117 U/L   Total Bilirubin 5.2 (*) 0.3 - 1.2 mg/dL   GFR calc non Af Amer 18 (*) >90 mL/min   GFR calc Af Amer 21 (*) >90 mL/min   Comment: (NOTE)     The eGFR has been calculated using the CKD EPI equation.     This calculation has not been validated in all clinical situations.     eGFR's persistently <90 mL/min signify possible Chronic Kidney     Disease.   Anion gap 15  5 - 15  TROPONIN I     Status: None   Collection Time    01/09/14  6:12 AM      Result Value Ref Range   Troponin I <0.30  <0.30 ng/mL   Comment:            Due to the release kinetics of cTnI,     a negative result within the  first hours     of the onset of symptoms does not rule out     myocardial infarction with certainty.     If myocardial infarction is still suspected,     repeat the test at appropriate intervals.  CK     Status: None   Collection Time    01/09/14  6:12 AM      Result Value Ref Range   Total CK 59  7 - 177 U/L  GLUCOSE, CAPILLARY     Status: Abnormal   Collection Time    01/09/14  7:41 AM      Result Value Ref Range   Glucose-Capillary 169 (*) 70 - 99 mg/dL  CBC WITH DIFFERENTIAL     Status: Abnormal   Collection Time    01/09/14 10:15 AM      Result Value Ref Range   WBC 8.6  4.0 - 10.5 K/uL   RBC 1.99 (*) 3.87 - 5.11 MIL/uL   Hemoglobin 7.0 (*) 12.0 - 15.0 g/dL   HCT 20.9 (*) 36.0 - 46.0 %   MCV 105.0 (*) 78.0 - 100.0 fL   MCH 35.2 (*) 26.0 - 34.0 pg   MCHC 33.5  30.0 - 36.0 g/dL   RDW 14.0  11.5 - 15.5 %   Platelets 125 (*) 150 - 400 K/uL   Neutrophils Relative % 80 (*) 43 - 77 %   Neutro Abs 6.8  1.7 - 7.7 K/uL   Lymphocytes Relative 11 (*) 12 - 46 %   Lymphs Abs 1.0  0.7 - 4.0 K/uL   Monocytes Relative 9  3 - 12 %   Monocytes Absolute 0.8  0.1 - 1.0 K/uL   Eosinophils Relative 0  0 - 5 %   Eosinophils Absolute 0.0  0.0 - 0.7 K/uL   Basophils Relative 0  0 - 1 %   Basophils Absolute  0.0  0.0 - 0.1 K/uL  SAVE SMEAR     Status: None   Collection Time    01/09/14 10:15 AM      Result Value Ref Range   Smear Review SMEAR STAINED AND AVAILABLE FOR REVIEW    GLUCOSE, CAPILLARY     Status: Abnormal   Collection Time    01/09/14 11:52 AM      Result Value Ref Range   Glucose-Capillary 252 (*) 70 - 99 mg/dL  TROPONIN I     Status: None   Collection Time    01/09/14 11:53 AM      Result Value Ref Range   Troponin I <0.30  <0.30 ng/mL   Comment:            Due to the release kinetics of cTnI,     a negative result within the first hours     of the onset of symptoms does not rule out     myocardial infarction with certainty.     If myocardial infarction is still  suspected,     repeat the test at appropriate intervals.    US Abdomen Complete  01/08/2014   CLINICAL DATA:  Right upper quadrant pain with elevated hepatic function studies and history of cirrhosis, HIV and diabetes. Evaluate for ascites.  EXAM: ULTRASOUND ABDOMEN COMPLETE  COMPARISON:  Renal ultrasound of December 18, 2013  FINDINGS: Gallbladder:  The gallbladder is adequately distended with no evidence of stones. There is no positive sonographic Murphy's sign. There is no pericholecystic fluid.  Common bile duct:  Diameter: 3.3 mm  Liver:  There is no focal mass or ductal dilation. Portal venous flow is normal in direction toward the liver.  IVC:  No abnormality visualized.  Pancreas:  Visualized portion unremarkable.  Spleen:  The spleen exhibits normal echotexture and measures 9.5 cm in greatest dimension  Right Kidney:  Length: 10.1 cm. The echotexture of the right kidney is increased. There is no focal mass or hydronephrosis.  Left Kidney:  Length: 10.9 cm. The cortical echotexture of the left kidney is also mildly increased. There is no hydronephrosis or focal mass. 2.5 cm  Abdominal aorta:  No aneurysm visualized.  Other findings:  No ascites is demonstrated.  IMPRESSION: 1. Increased echotexture both kidneys is consistent with medical renal disease. There is no hydronephrosis. 2. The liver, gallbladder, pancreas, and common bile duct exhibit no acute abnormalities. 3. No ascites is demonstrated.   Electronically Signed   By: David  Martinique   On: 01/08/2014 14:41   Assessment/Plan: 53 y/o with known mixed seizure disorder, recurrent seizures last night and further isolated seizure earlier today. Has acute on chronic renal failure. Recommend; 1) continue current renal dose keppra. 2) patient has renal failure but can still increase vimpat to 150 mg BID.  Dorian Pod, MD 01/09/2014, 3:32 PM  [facility]

## 2014-01-10 ENCOUNTER — Encounter (HOSPITAL_COMMUNITY)
Admission: EM | Disposition: A | Discharge status: Skilled Nursing Facility | Payer: Self-pay | Source: Home / Self Care | Attending: Internal Medicine

## 2014-01-10 ENCOUNTER — Inpatient Hospital Stay (HOSPITAL_COMMUNITY): Payer: Medicaid Other

## 2014-01-10 ENCOUNTER — Encounter (HOSPITAL_COMMUNITY): Payer: Self-pay

## 2014-01-10 DIAGNOSIS — I369 Nonrheumatic tricuspid valve disorder, unspecified: Secondary | ICD-10-CM

## 2014-01-10 DIAGNOSIS — R509 Fever, unspecified: Secondary | ICD-10-CM

## 2014-01-10 HISTORY — PX: TEE WITHOUT CARDIOVERSION: SHX5443

## 2014-01-10 LAB — CBC WITH DIFFERENTIAL/PLATELET
BASOS ABS: 0 10*3/uL (ref 0.0–0.1)
BASOS PCT: 0 % (ref 0–1)
EOS PCT: 0 % (ref 0–5)
Eosinophils Absolute: 0 10*3/uL (ref 0.0–0.7)
HCT: 20.4 % — ABNORMAL LOW (ref 36.0–46.0)
Hemoglobin: 6.8 g/dL — CL (ref 12.0–15.0)
Lymphocytes Relative: 14 % (ref 12–46)
Lymphs Abs: 1.1 10*3/uL (ref 0.7–4.0)
MCH: 36 pg — ABNORMAL HIGH (ref 26.0–34.0)
MCHC: 33.3 g/dL (ref 30.0–36.0)
MCV: 107.9 fL — AB (ref 78.0–100.0)
Monocytes Absolute: 0.9 10*3/uL (ref 0.1–1.0)
Monocytes Relative: 12 % (ref 3–12)
Neutro Abs: 5.6 10*3/uL (ref 1.7–7.7)
Neutrophils Relative %: 73 % (ref 43–77)
Platelets: 138 10*3/uL — ABNORMAL LOW (ref 150–400)
RBC: 1.89 MIL/uL — ABNORMAL LOW (ref 3.87–5.11)
RDW: 14 % (ref 11.5–15.5)
WBC: 7.7 10*3/uL (ref 4.0–10.5)

## 2014-01-10 LAB — COMPREHENSIVE METABOLIC PANEL
ALBUMIN: 1.4 g/dL — AB (ref 3.5–5.2)
ALK PHOS: 499 U/L — AB (ref 39–117)
ALT: 8 U/L (ref 0–35)
ANION GAP: 15 (ref 5–15)
AST: 67 U/L — AB (ref 0–37)
BILIRUBIN TOTAL: 4 mg/dL — AB (ref 0.3–1.2)
BUN: 55 mg/dL — AB (ref 6–23)
CHLORIDE: 108 meq/L (ref 96–112)
CO2: 16 meq/L — AB (ref 19–32)
Calcium: 7.4 mg/dL — ABNORMAL LOW (ref 8.4–10.5)
Creatinine, Ser: 3.08 mg/dL — ABNORMAL HIGH (ref 0.50–1.10)
GFR calc Af Amer: 19 mL/min — ABNORMAL LOW (ref >90)
GFR, EST NON AFRICAN AMERICAN: 16 mL/min — AB (ref >90)
Glucose, Bld: 246 mg/dL — ABNORMAL HIGH (ref 70–99)
POTASSIUM: 3.6 meq/L — AB (ref 3.7–5.3)
Sodium: 139 mEq/L (ref 137–147)
Total Protein: 5.2 g/dL — ABNORMAL LOW (ref 6.0–8.3)

## 2014-01-10 LAB — GLUCOSE, CAPILLARY
GLUCOSE-CAPILLARY: 177 mg/dL — AB (ref 70–99)
GLUCOSE-CAPILLARY: 231 mg/dL — AB (ref 70–99)
Glucose-Capillary: 210 mg/dL — ABNORMAL HIGH (ref 70–99)
Glucose-Capillary: 264 mg/dL — ABNORMAL HIGH (ref 70–99)

## 2014-01-10 LAB — CULTURE, BLOOD (ROUTINE X 2)

## 2014-01-10 LAB — PREPARE RBC (CROSSMATCH)

## 2014-01-10 SURGERY — ECHOCARDIOGRAM, TRANSESOPHAGEAL
Anesthesia: Moderate Sedation

## 2014-01-10 MED ORDER — SODIUM CHLORIDE 0.9 % IV SOLN
Freq: Once | INTRAVENOUS | Status: DC
Start: 2014-01-10 — End: 2014-01-15

## 2014-01-10 MED ORDER — MIDAZOLAM HCL 10 MG/2ML IJ SOLN
INTRAMUSCULAR | Status: DC | PRN
  Administered 2014-01-10 (×2): 2 mg via INTRAVENOUS

## 2014-01-10 MED ORDER — FENTANYL CITRATE 0.05 MG/ML IJ SOLN
INTRAMUSCULAR | Status: AC
  Filled 2014-01-10: qty 2

## 2014-01-10 MED ORDER — FENTANYL CITRATE 0.05 MG/ML IJ SOLN
INTRAMUSCULAR | Status: DC | PRN
  Administered 2014-01-10 (×2): 25 ug via INTRAVENOUS

## 2014-01-10 MED ORDER — MIDAZOLAM HCL 5 MG/ML IJ SOLN
INTRAMUSCULAR | Status: AC
  Filled 2014-01-10: qty 2

## 2014-01-10 MED ORDER — BUTAMBEN-TETRACAINE-BENZOCAINE 2-2-14 % EX AERO
INHALATION_SPRAY | CUTANEOUS | Status: DC | PRN
  Administered 2014-01-10: 2 via TOPICAL

## 2014-01-10 MED ORDER — IOHEXOL 300 MG/ML  SOLN
50.0000 mL | Freq: Once | INTRAMUSCULAR | Status: AC | PRN
  Administered 2014-01-10: 50 mL via ORAL

## 2014-01-10 MED ORDER — DIPHENHYDRAMINE HCL 50 MG/ML IJ SOLN
INTRAMUSCULAR | Status: AC
  Filled 2014-01-10: qty 1

## 2014-01-10 MED ORDER — SODIUM CHLORIDE 0.9 % IV SOLN
INTRAVENOUS | Status: DC
  Administered 2014-01-10: 09:00:00 via INTRAVENOUS

## 2014-01-10 NOTE — Progress Notes (Signed)
PT Cancellation Note  Patient Details Name: Michelle [name] MRN: [id number] DOB: Apr 26, 1961   Cancelled Treatment:    Reason Eval/Treat Not Completed: Patient at procedure or test/unavailable IV team on the way to insert PICC line and then pt to be getting blood transfusion as Hgb 6.8, down from yesterday. Will follow up next available time.   Alvie Heidelberg A 01/10/2014, 3:48 PM Alvie Heidelberg, PT, DPT [phone]

## 2014-01-10 NOTE — Progress Notes (Addendum)
Subjective: She had two episodes of pseudoseizures last night and fever with Tmax of 101.76F. She was at her baseline mental status today with no new complaints.   Objective: Vital signs in last 24 hours: Filed Vitals:   01/10/14 1631 01/10/14 1705 01/10/14 1805 01/10/14 1905  BP: 123/75 106/65 108/71 102/64  Pulse: 72 75 75 77  Temp: 98.2 F (36.8 C) 98 F (36.7 C) 97.3 F (36.3 C) 98.9 F (37.2 C)  TempSrc: Oral Oral Axillary Oral  Resp: 18 19 18 19   Height:      Weight:      SpO2: 98% 100% 100% 98%   Weight change:   Intake/Output Summary (Last 24 hours) at 01/10/14 1918 Last data filed at 01/10/14 1905  Gross per 24 hour  Intake  792.5 ml  Output    400 ml  Net  392.5 ml   Vital signs reviewed  Gen: Lying in bed in NAD, following commands appropriately  HEENT: no scleral icterus, MM  CV: RRR, no murmurs, rubs, or gallops  Resp: bibasilar crackles bilaterally, normal respiratory effort, no wheezing  Abd: mildly distended, soft, bowel sounds present, no caput medusa, RUQ mildly TTP  Ext: no peripheral edema, dry skin, no teleangiectasia. Splinter hemorrhage in left 5th finger  Neuro: Alert and oriented x3, no slurred speech, CN II-XII intact, no asterixis  Lab Results: Basic Metabolic Panel:  Recent Labs Lab 01/08/14 2340 01/09/14 0612 01/10/14 0540  NA 136* 140 139  K 3.5* 4.0 3.6*  CL 103 108 108  CO2 16* 17* 16*  GLUCOSE 256* 197* 246*  BUN 49* 51* 55*  CREATININE 2.77* 2.83* 3.08*  CALCIUM 7.2* 7.2* 7.4*  MG 1.7  --   --   PHOS 3.2  --   --    Liver Function Tests:  Recent Labs Lab 01/09/14 0612 01/10/14 0540  AST 62* 67*  ALT 14 8  ALKPHOS 430* 499*  BILITOT 5.2* 4.0*  PROT 5.4* 5.2*  ALBUMIN 1.5* 1.4*    Recent Labs Lab 01/03/14 2115  LIPASE 116*    Recent Labs Lab 01/04/14 0100 01/04/14 0730  AMMONIA 127* 161*   CBC:  Recent Labs Lab 01/09/14 1015 01/10/14 0540  WBC 8.6 7.7  NEUTROABS 6.8 5.6  HGB 7.0* 6.8*  HCT  20.9* 20.4*  MCV 105.0* 107.9*  PLT 125* 138*   Cardiac Enzymes:  Recent Labs Lab 01/08/14 2340 01/09/14 0612 01/09/14 1153  CKTOTAL  --  59  --   TROPONINI 0.34* <0.30 <0.30   CBG:  Recent Labs Lab 01/09/14 1152 01/09/14 1706 01/09/14 2143 01/10/14 0800 01/10/14 1154 01/10/14 1652  GLUCAP 252* 171* 202* 210* 177* 231*   Coagulation:  Recent Labs Lab 01/04/14 0730 01/08/14 0925  LABPROT 12.4 12.0  INR 0.92 0.89   Anemia Panel:  Recent Labs Lab 01/04/14 0730  VITAMINB12 564  FERRITIN 611*  TIBC 180*  IRON 90   Urine Drug Screen: Drugs of Abuse     Component Value Date/Time   LABOPIA NONE DETECTED 01/03/2014 2311   LABOPIA NEG 11/13/2013 1516   COCAINSCRNUR NONE DETECTED 01/03/2014 2311   COCAINSCRNUR NEG 11/13/2013 1516   LABBENZ NONE DETECTED 01/03/2014 2311   LABBENZ NEG 11/13/2013 1516   AMPHETMU NONE DETECTED 01/03/2014 2311   THCU NONE DETECTED 01/03/2014 2311   LABBARB NONE DETECTED 01/03/2014 2311   LABBARB NEG 11/13/2013 1516    Urinalysis:  Recent Labs Lab 01/03/14 2311 01/06/14 1141  COLORURINE YELLOW ORANGE*  LABSPEC  1.010 1.021  PHURINE 6.0 5.5  GLUCOSEU 100* 100*  HGBUR NEGATIVE NEGATIVE  BILIRUBINUR NEGATIVE LARGE*  KETONESUR NEGATIVE NEGATIVE  PROTEINUR >300* >300*  UROBILINOGEN 1.0 1.0  NITRITE NEGATIVE NEGATIVE  LEUKOCYTESUR NEGATIVE TRACE*    Micro Results: Recent Results (from the past 240 hour(s))  MRSA PCR SCREENING     Status: Abnormal   Collection Time    01/04/14  4:35 AM      Result Value Ref Range Status   MRSA by PCR POSITIVE (*) NEGATIVE Final   Comment:            The GeneXpert MRSA Assay (FDA     approved for NASAL specimens     only), is one component of a     comprehensive MRSA colonization     surveillance program. It is not     intended to diagnose MRSA     infection nor to guide or     monitor treatment for     MRSA infections.     RESULT CALLED TO, READ BACK BY AND VERIFIED WITH:     R.ZELLNER,RN 1610  01/04/14 M.CAMPBELL  CULTURE, BLOOD (ROUTINE X 2)     Status: None   Collection Time    01/05/14  3:30 PM      Result Value Ref Range Status   Specimen Description BLOOD LEFT ARM   Final   Special Requests     Final   Value: BOTTLES DRAWN AEROBIC AND ANAEROBIC 10CC BLUE, 5CC RED   Culture  Setup Time     Final   Value: 01/05/2014 22:48     Performed at [facility]   Culture     Final   Value: STAPHYLOCOCCUS AUREUS     Note: RIFAMPIN AND GENTAMICIN SHOULD NOT BE USED AS SINGLE DRUGS FOR TREATMENT OF STAPH INFECTIONS.     Note: Gram Stain Report Called to,Read Back By and Verified With: Volanda Napoleon RN on 01/06/14 at 06:45 by Christie Nottingham     Performed at [facility]   Report Status 01/08/2014 FINAL   Final   Organism ID, Bacteria STAPHYLOCOCCUS AUREUS   Final  CULTURE, BLOOD (ROUTINE X 2)     Status: None   Collection Time    01/05/14  3:42 PM      Result Value Ref Range Status   Specimen Description BLOOD BLOOD LEFT FOREARM   Final   Special Requests BOTTLES DRAWN AEROBIC ONLY 5CC   Final   Culture  Setup Time     Final   Value: 01/05/2014 22:48     Performed at [facility]   Culture     Final   Value: STAPHYLOCOCCUS AUREUS     Note: SUSCEPTIBILITIES PERFORMED ON PREVIOUS CULTURE WITHIN THE LAST 5 DAYS.     Note: Gram Stain Report Called to,Read Back By and Verified With: Volanda Napoleon RN on 01/06/14 at 06:45 by Christie Nottingham     Performed at [facility]   Report Status 01/08/2014 FINAL   Final  CULTURE, BLOOD (ROUTINE X 2)     Status: None   Collection Time    01/06/14  3:35 PM      Result Value Ref Range Status   Specimen Description BLOOD LEFT ANTECUBITAL   Final   Special Requests     Final   Value: BOTTLES DRAWN AEROBIC AND ANAEROBIC 10CC AER 5CC ANA   Culture  Setup Time     Final   Value:  01/06/2014 19:02     Performed at [facility]   Culture     Final   Value: STAPHYLOCOCCUS AUREUS     Note: RIFAMPIN AND GENTAMICIN SHOULD  NOT BE USED AS SINGLE DRUGS FOR TREATMENT OF STAPH INFECTIONS.     Note: Gram Stain Report Called to,Read Back By and Verified With: Junius Argyle 01/08/14 AT 0100 RIDK     Performed at [facility]   Report Status 01/10/2014 FINAL   Final   Organism ID, Bacteria STAPHYLOCOCCUS AUREUS   Final  CULTURE, BLOOD (ROUTINE X 2)     Status: None   Collection Time    01/06/14  3:45 PM      Result Value Ref Range Status   Specimen Description BLOOD LEFT HAND   Final   Special Requests     Final   Value: BOTTLES DRAWN AEROBIC AND ANAEROBIC 10CC AER 5CC ANA   Culture  Setup Time     Final   Value: 01/06/2014 19:00     Performed at [facility]   Culture     Final   Value:        BLOOD CULTURE RECEIVED NO GROWTH TO DATE CULTURE WILL BE HELD FOR 5 DAYS BEFORE ISSUING A FINAL NEGATIVE REPORT     Performed at [facility]   Report Status PENDING   Incomplete  CULTURE, BLOOD (ROUTINE X 2)     Status: None   Collection Time    01/08/14  6:56 AM      Result Value Ref Range Status   Specimen Description BLOOD LEFT HAND   Final   Special Requests BOTTLES DRAWN AEROBIC AND ANAEROBIC 10CC   Final   Culture  Setup Time     Final   Value: 01/08/2014 10:12     Performed at [facility]   Culture     Final   Value:        BLOOD CULTURE RECEIVED NO GROWTH TO DATE CULTURE WILL BE HELD FOR 5 DAYS BEFORE ISSUING A FINAL NEGATIVE REPORT     Performed at [facility]   Report Status PENDING   Incomplete  CULTURE, BLOOD (ROUTINE X 2)     Status: None   Collection Time    01/08/14  7:05 AM      Result Value Ref Range Status   Specimen Description BLOOD BLOOD LEFT FOREARM   Final   Special Requests BOTTLES DRAWN AEROBIC ONLY [facility]   Final   Culture  Setup Time     Final   Value: 01/08/2014 10:12     Performed at [facility]   Culture     Final   Value:        BLOOD CULTURE RECEIVED NO GROWTH TO DATE CULTURE WILL BE HELD FOR 5 DAYS BEFORE ISSUING A FINAL NEGATIVE  REPORT     Performed at [facility]   Report Status PENDING   Incomplete   Medications: I have reviewed the patient's current medications. Scheduled Meds: . sodium chloride   Intravenous Once  . abacavir  300 mg Oral BID  . acyclovir  800 mg Oral Daily  . aspirin EC  81 mg Oral Daily  .  ceFAZolin (ANCEF) IV  2 g Intravenous Q12H  . cloNIDine  0.3 mg Oral TID  . Darunavir Ethanolate  800 mg Oral Q breakfast  . enoxaparin (LOVENOX) injection  30 mg Subcutaneous Q24H  . hydrALAZINE  50 mg  Oral 3 times per day  . hydrocerin   Topical BID  . insulin aspart  0-9 Units Subcutaneous TID WC  . insulin glargine  20 Units Subcutaneous QHS  . labetalol  600 mg Oral BID  . lacosamide  150 mg Oral BID  . lactulose  20 g Oral TID  . lamiVUDine  50 mg Oral Daily  . levETIRAcetam  500 mg Oral BID  . magic mouthwash w/lidocaine  5 mL Oral TID  . pantoprazole  40 mg Oral Daily  . ritonavir  100 mg Oral Q breakfast  . sodium bicarbonate  650 mg Oral BID  . sodium chloride  3 mL Intravenous Q12H   Continuous Infusions: . sodium chloride 20 mL/hr at 01/10/14 0917   PRN Meds:.acetaminophen, hydrOXYzine, menthol-cetylpyridinium, ondansetron (ZOFRAN) IV, ondansetron Assessment/Plan: Ms. 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   Sepsis due to MSSA: Her mental status is unchanged. She had fever with Tmax 102.15F on 8/9 with BC at that time that grew MSSA in 2/2 vials. She received vancomycin for 2 days and has been on cefazolin for 3 days.The source of her bacteremia was likely her port-a-cath placed on 12/05/13 which was removed on 8/11.However, she continues to spike fevers. SBP less likely given minimum ascites, no abdominal pain, no changes in mental  status. No signs of endocarditis on exam with no new murmur. TEE with no endocarditis. 2D echo with no signs of endocarditis.  -Continue cefazolin, will need PICC line once afebrile for 48 hrs--for tx 4 weeks total -Continue monitoring for s/s of SBP  -MRI spine for epidural abscess eval -CT abd/pelvis for eval of intraabdominal process -Order BC x2 again if she spikes a fever overnight.   Acute on chronic Anemia: Baseline Hg of 7-8. Dropped to 6.8 overnight. No active bleeding. Likely anemia of chronic disease though she has had a question of autoimmune anemia in the past also preceded by fever. Two weeks ago her hemoglobin was lower around 6.7; since then it was steadily increasing.  -1 unit of pRBC  -CBC post-transfusion  Acute on Chronic Renal Failure : Cr trended down to 2.8 yesterday but up to 3.08 today.  This is likely multifactorial from NPO state with no IVF, furosemide use, ACEi use, and possibly Vancomycin use.  -Strict in and outs  -Hold Lasix and ACEi  -CMP in am  Hepatic Encephalopathy: Resolved. She arrived lethargic which was concerning for acute hepatic encephalopathy. Ammonia level on admission was 127 and increased to 161, but she is back to her baseline mental status today.  -Continue home lactulose PO PRN titrated to 3BM/day  -Diet heart healthy/ Carb mod only if fully alert   Ascites: Soft and obese abdomen, only mildly distended and not tense. CT abdomen/pelvis on presentation with only mild ascities, no acute intraabdominal process. She had mild RUQ pain today but [place] Abd was unremarkable for acute gall bladder disease. Not concerned for SBP at this time. Bilirubin trending down at 4 today.  -Will consider diagnostic/therapeutic tap of ascitic fluid if she declines  -CMP in am   History of seizure: She was Dilantin at home which can be sedating. This may have precipitated her somnolence, as her corrected level was elevated at  25 on presentation. Dilantin has been  discontinued. Had pseudoseizures overnight with voluntary jerking movements, following commands, with no confusion.   -Continue vimpat  -Continue Keppra   Hypokalemia: Improved. K is 3.6 today. Will continue monitoring and provide gentle repletion given her CKD4 if K is at or <3.0.   Hepatitis C: Genotype in December 2014. Genotype 1b, viral load of 4276200, Log of 6.63.  -She will f/u with Dr. Drue Second in ID for possible treatment of Hep C   HIV disease: Well controlled with viral load undectable in 09/08/13. CD4 count in April at 280. Will recheck labs as dilantin level has been super therapeutic and can interact with antiretrovirals  -Repeat CD4  -Recheck viral load  -Continue home abacavir, prezista, norvir, and epivir  -Continue acyclovir for prophylaxis   HTN: Elevated today in setting of holding antihypertensives last night.  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 Secondary to Hepatitis C infection. 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 Platelets and INR stable.   DMII: Last Hg A1C 7.5% on 11/13/13. On Lantus 30 u qHS and Novolog 10-25 u actid at home.  -Lantus 20 units qHS  -Sensitive SSI   DVT prophylaxis: SCDs   Diet:  HH, Carb mod   Dispo: Disposition is deferred at this time, awaiting improvement of current medical problems. Anticipated discharge in approximately 2-3 day(s). She needs SNF placement while on Abx tx for 4 weeks as her sister will not be able to manage the PICC line and Abx.   The patient does have a current PCP Christen Bame, MD) and does need an [facility] hospital follow-up appointment after discharge.  The patient does have transportation limitations that hinder transportation to clinic appointments.  .Services Needed at time of discharge: Y = Yes, Blank = No PT:   OT:   RN:   Equipment:   Other:      LOS: 7 days   Ky Barban, MD 01/10/2014, 7:18 PM

## 2014-01-10 NOTE — Progress Notes (Signed)
IV team called to informed about new order for PICC line. They said they will to tomorrow 01/11/2014.

## 2014-01-10 NOTE — Op Note (Signed)
INDICATIONS: infective endocarditis  PROCEDURE:   Informed consent was obtained prior to the procedure. The risks, benefits and alternatives for the procedure were discussed and the patient comprehended these risks.  Risks include, but are not limited to, cough, sore throat, vomiting, nausea, somnolence, esophageal and stomach trauma or perforation, bleeding, low blood pressure, aspiration, pneumonia, infection, trauma to the teeth and death.    After a procedural time-out, the oropharynx was anesthetized with 20% benzocaine spray. The patient was given 4 mg versed and 50 mcg fentanyl for moderate sedation.   The transesophageal probe was inserted in the esophagus and stomach without difficulty and multiple views were obtained.  The patient was kept under observation until the patient left the procedure room.  The patient left the procedure room in stable condition.   Agitated microbubble saline contrast was not administered.  COMPLICATIONS:    There were no immediate complications.  FINDINGS:  No signs of endocarditis  RECOMMENDATIONS:    Evaluate for other sources of bacteriemia  Time Spent Directly with the Patient:  60 minutes   Kamal Jurgens 01/10/2014, 10:07 AM

## 2014-01-10 NOTE — Clinical Social Work Psychosocial (Signed)
Clinical Social Work Department BRIEF PSYCHOSOCIAL ASSESSMENT 01/10/2014  Patient:  Michelle [name], Michelle [name]     Account Number:  [id number]     Admit date:  01/03/2014  Clinical Social Worker:  Lovey Newcomer  Date/Time:  01/10/2014 06:12 PM  Referred by:  Physician  Date Referred:  01/10/2014 Referred for  SNF Placement   Other Referral:   Interview type:  Patient Other interview type:   Patient alert and oriented at time of assessment.    PSYCHOSOCIAL DATA Living Status:  FAMILY Admitted from facility:   Level of care:   Primary support name:  Dianne and Arletha Primary support relationship to patient:  SIBLING Degree of support available:   Support is fair.    CURRENT CONCERNS Current Concerns  Post-Acute Placement   Other Concerns:    SOCIAL WORK ASSESSMENT / PLAN CSW met with patient at bedside. CSW explained that [facility] and MD has informed CSW that patient is agreeable to SNF placement. Patient confirms that she is agreeable to DC to SNF. Patient is agreeable to SNF placement as she feels sister will not be able to provide her with the assistance she needs at discharge. CSW explained that a referral will be made to area SNFs and that patient will DC to the first available facility. CSW explained that patient will need to DC to a facility that will accept her Medicaid. CSW explained that patient will need to stay at the SNF that accepts her for atleast 30 days, as most SNFs require this for Medicaid patients. Patient seemed indifferent about placement overall. Report will be left for weekend CSW.   Assessment/plan status:  Psychosocial Support/Ongoing Assessment of Needs Other assessment/ plan:   Complete FL2, Fax, PASRR   Information/referral to community resources:   Bed offer(s) will be given.    PATIENT'S/FAMILY'S RESPONSE TO PLAN OF CARE: Patient states that she is agreeable to SNF placement at discharge. CSW will follow up with SNF options for patient.        Liz Beach MSW, [place], [place], [phone]

## 2014-01-10 NOTE — Progress Notes (Signed)
Hgb 6.8 this am just down from 7.0 yesterday.  Placed this in page text to MD on call this am approx. [phone].

## 2014-01-10 NOTE — Progress Notes (Signed)
Assessed for PICC line insertion.  Rt arm restricted.  Lt arm PICC would occupy 115% of basilic vein and 65% of brachial vein.  Unable to locate cephalic vein.  No suitable veins for bedside insertion.  If PICC still desired may consider intervental Radiology for placement.

## 2014-01-10 NOTE — Progress Notes (Signed)
At 0640, Pt. C/o's chest "soreness".  When I told pt. She only had Tylenol ordered she said, "I need something stronger than that!   It's chest pain...10"   SR 72 on the monitor.  MD on-call for IM paged.  VSS. In to obtain EKG and pt had to be awakened in order to do it.  SR 73.  Pt. Went right back to sleep in no acute distress.  Reported findings to day shift nurse.

## 2014-01-10 NOTE — Progress Notes (Signed)
Day of Surgery  Subjective: PAC removed 8/11 ---poss infection Doing well now Has been experiencing sz activity--Neuro MD follows  Objective: Vital signs in last 24 hours: Temp:  [98.2 F (36.8 C)-101.2 F (38.4 C)] 98.6 F (37 C) (08/14 1017) Pulse Rate:  [65-90] 71 (08/14 1017) Resp:  [18-25] 18 (08/14 1017) BP: (120-158)/(63-75) 131/63 mmHg (08/14 1017) SpO2:  [97 %-100 %] 99 % (08/14 1017) Weight:  [80.1 kg (176 lb 9.4 oz)] 80.1 kg (176 lb 9.4 oz) (08/14 0459) Last BM Date: 01/09/14  Intake/Output from previous day: 08/13 0701 - 08/14 0700 In: 1080 [P.O.:1080] Out: 250 [Urine:250] Intake/Output this shift:    PE:  Afeb; vss Wbc 7.7 Site clean and dry RN has been pulling back iodoform gauze 1 inch daily and redressing No sign of infection No redness  Lab Results:   Recent Labs  01/09/14 1015 01/10/14 0540  WBC 8.6 7.7  HGB 7.0* 6.8*  HCT 20.9* 20.4*  PLT 125* 138*   BMET  Recent Labs  01/09/14 0612 01/10/14 0540  NA 140 139  K 4.0 3.6*  CL 108 108  CO2 17* 16*  GLUCOSE 197* 246*  BUN 51* 55*  CREATININE 2.83* 3.08*  CALCIUM 7.2* 7.4*   PT/INR  Recent Labs  01/08/14 0925  LABPROT 12.0  INR 0.89   ABG No results found for this basename: PHART, PCO2, PO2, HCO3,  in the last 72 hours  Studies/Results: US Abdomen Complete  01/08/2014   CLINICAL DATA:  Right upper quadrant pain with elevated hepatic function studies and history of cirrhosis, HIV and diabetes. Evaluate for ascites.  EXAM: ULTRASOUND ABDOMEN COMPLETE  COMPARISON:  Renal ultrasound of December 18, 2013  FINDINGS: Gallbladder:  The gallbladder is adequately distended with no evidence of stones. There is no positive sonographic Murphy's sign. There is no pericholecystic fluid.  Common bile duct:  Diameter: 3.3 mm  Liver:  There is no focal mass or ductal dilation. Portal venous flow is normal in direction toward the liver.  IVC:  No abnormality visualized.  Pancreas:  Visualized portion  unremarkable.  Spleen:  The spleen exhibits normal echotexture and measures 9.5 cm in greatest dimension  Right Kidney:  Length: 10.1 cm. The echotexture of the right kidney is increased. There is no focal mass or hydronephrosis.  Left Kidney:  Length: 10.9 cm. The cortical echotexture of the left kidney is also mildly increased. There is no hydronephrosis or focal mass. 2.5 cm  Abdominal aorta:  No aneurysm visualized.  Other findings:  No ascites is demonstrated.  IMPRESSION: 1. Increased echotexture both kidneys is consistent with medical renal disease. There is no hydronephrosis. 2. The liver, gallbladder, pancreas, and common bile duct exhibit no acute abnormalities. 3. No ascites is demonstrated.   Electronically Signed   By: David  [place]   On: 01/08/2014 14:41    Anti-infectives: Anti-infectives   Start     Dose/Rate Route Frequency Ordered Stop   01/09/14 0630  vancomycin (VANCOCIN) IVPB 750 mg/150 ml premix  Status:  Discontinued     750 mg 150 mL/hr over 60 Minutes Intravenous Every 48 hours 01/07/14 1418 01/08/14 1433   01/08/14 1000  lamiVUDine (EPIVIR) 10 MG/ML solution 50 mg     50 mg Oral Daily 01/07/14 1517     01/07/14 0600  vancomycin (VANCOCIN) IVPB 750 mg/150 ml premix  Status:  Discontinued     750 mg 150 mL/hr over 60 Minutes Intravenous Every 24 hours 01/06/14 0701 01/07/14 1418  01/06/14 1600  ceFAZolin (ANCEF) IVPB 2 g/50 mL premix     2 g 100 mL/hr over 30 Minutes Intravenous Every 12 hours 01/06/14 1518     01/06/14 1500  lamiVUDine (EPIVIR) 10 MG/ML solution 100 mg  Status:  Discontinued     100 mg Oral Daily 01/06/14 1402 01/07/14 1517   01/06/14 0800  vancomycin (VANCOCIN) 1,250 mg in sodium chloride 0.9 % 250 mL IVPB     1,250 mg 166.7 mL/hr over 90 Minutes Intravenous  Once 01/06/14 0701 01/06/14 1156   01/04/14 1000  lamiVUDine (EPIVIR) tablet 150 mg  Status:  Discontinued     150 mg Oral Daily 01/04/14 0414 01/06/14 1401   01/04/14 1000  abacavir (ZIAGEN)  tablet 300 mg     300 mg Oral 2 times daily 01/04/14 0414     01/04/14 1000  acyclovir (ZOVIRAX) tablet 800 mg     800 mg Oral Daily 01/04/14 0414     01/04/14 0800  Darunavir Ethanolate (PREZISTA) tablet 800 mg     800 mg Oral Daily with breakfast 01/04/14 0414     01/04/14 0800  ritonavir (NORVIR) capsule 100 mg     100 mg Oral Daily with breakfast 01/04/14 0414        Assessment/Plan: s/p  PAC removal 8/11 Doing well Site healing well Will follow RN to continue pulling back iodoform dressing 1 inch per day   LOS: 7 days    Michelle [name] A 01/10/2014

## 2014-01-10 NOTE — Progress Notes (Signed)
  Echocardiogram Echocardiogram Transesophageal has been performed.  Georgian Co 01/10/2014, 10:23 AM

## 2014-01-10 NOTE — Progress Notes (Signed)
Agree 

## 2014-01-10 NOTE — Interval H&P Note (Signed)
History and Physical Interval Note:  01/10/2014 9:22 AM  Michelle [name]  has presented today for surgery, with the diagnosis of MRSA  The various methods of treatment have been discussed with the patient and family. After consideration of risks, benefits and other options for treatment, the patient has consented to  Procedure(s): TRANSESOPHAGEAL ECHOCARDIOGRAM (TEE) (N/A) as a surgical intervention .  The patient's history has been reviewed, patient examined, no change in status, stable for surgery.  I have reviewed the patient's chart and labs.  Questions were answered to the patient's satisfaction.     [name]

## 2014-01-10 NOTE — Progress Notes (Addendum)
  Date: 01/10/2014  Patient name: Michelle [name]  Medical record number: [id number]  Date of birth: [birth year]    53yo F with HIV-HCV presented encephalopathy thought to be due to Staph Aureus Bacteremia. Her subcutaneous port has been removed. Her hospitalization complicate by possible seizure and pseudoseizure, she is now on keppra and lacosamide. She is  no longer bacteremic. She is on cefazolin day #4. She had fever of 101 yesterday but not informed of to the team. We will repeat blood cultures in addition to doing mri of spine looking for occult abscess. Her TEE was negative for vegetations.      [facility] Staphylococcus aureus bacteremia   Staphylococcus aureus bacteremia (SAB) is associated with a high rate of complications and mortality.  Specific aspects of clinical management are critical to optimizing the outcome of patients with SAB.  Therefore, the [facility] Antimicrobial Management Team [facility]) has initiated an intervention aimed at improving the management of SAB at [facility].  To do so, Infectious Diseases physicians are providing an evidence-based consult for the management of all patients with SAB.     Yes No Comments  Perform follow-up blood cultures (even if the patient is afebrile) to ensure clearance of bacteremia [x]  []  Still pending results from 8/12 as well as today  Remove vascular catheter and obtain follow-up blood cultures after the removal of the catheter [x]  []  Subcutaneous port removed  Perform echocardiography to evaluate for endocarditis (transthoracic ECHO is 40-50% sensitive, TEE is > 90% sensitive) [x]  []  TEE is negative on 8/14       Ensure source control []  [x]  Still having fever  Investigate for "metastatic" sites of infection []  [x]  Will get mri of spine  Change antibiotic therapy to ___________cefazolin_______ [x]  []  Beta-lactam antibiotics are preferred for MSSA due to higher cure rates.   If on Vancomycin, goal trough  should be 15 - 20 mcg/mL  Estimated duration of IV antibiotic therapy:  4-6 wk for complicated bacteremia [x]  []  Consult case management for probably prolonged outpatient IV antibiotic therapy    This patient has been seen and the plan of care was discussed with the house staff. Please see their note for complete details. I concur with their findings with the following additions/corrections:  Will keep her on cefazolin, adjust dose renally. Will need for her to have mri of spine, repeat blood culture. Still would wait to get picc line until afebrile for 48hr adn blood cx negative for 48-72hr.  Judyann Munson, MD 01/10/2014, 3:15 PM

## 2014-01-11 LAB — COMPREHENSIVE METABOLIC PANEL
ALT: <5 U/L (ref 0–35)
AST: 58 U/L — AB (ref 0–37)
Albumin: 1.4 g/dL — ABNORMAL LOW (ref 3.5–5.2)
Alkaline Phosphatase: 544 U/L — ABNORMAL HIGH (ref 39–117)
Anion gap: 17 — ABNORMAL HIGH (ref 5–15)
BUN: 58 mg/dL — ABNORMAL HIGH (ref 6–23)
CO2: 14 meq/L — AB (ref 19–32)
Calcium: 7.7 mg/dL — ABNORMAL LOW (ref 8.4–10.5)
Chloride: 103 mEq/L (ref 96–112)
Creatinine, Ser: 3.28 mg/dL — ABNORMAL HIGH (ref 0.50–1.10)
GFR, EST AFRICAN AMERICAN: 17 mL/min — AB (ref >90)
GFR, EST NON AFRICAN AMERICAN: 15 mL/min — AB (ref >90)
Glucose, Bld: 226 mg/dL — ABNORMAL HIGH (ref 70–99)
Potassium: 3.8 mEq/L (ref 3.7–5.3)
Sodium: 134 mEq/L — ABNORMAL LOW (ref 137–147)
Total Bilirubin: 3.5 mg/dL — ABNORMAL HIGH (ref 0.3–1.2)
Total Protein: 5.5 g/dL — ABNORMAL LOW (ref 6.0–8.3)

## 2014-01-11 LAB — CBC
HEMATOCRIT: 20.1 % — AB (ref 36.0–46.0)
Hemoglobin: 6.6 g/dL — CL (ref 12.0–15.0)
MCH: 35.3 pg — ABNORMAL HIGH (ref 26.0–34.0)
MCHC: 32.8 g/dL (ref 30.0–36.0)
MCV: 107.5 fL — ABNORMAL HIGH (ref 78.0–100.0)
PLATELETS: 155 10*3/uL (ref 150–400)
RBC: 1.87 MIL/uL — ABNORMAL LOW (ref 3.87–5.11)
RDW: 13.9 % (ref 11.5–15.5)
WBC: 6.7 10*3/uL (ref 4.0–10.5)

## 2014-01-11 LAB — URINE MICROSCOPIC-ADD ON

## 2014-01-11 LAB — GLUCOSE, CAPILLARY
GLUCOSE-CAPILLARY: 232 mg/dL — AB (ref 70–99)
GLUCOSE-CAPILLARY: 274 mg/dL — AB (ref 70–99)
Glucose-Capillary: 290 mg/dL — ABNORMAL HIGH (ref 70–99)
Glucose-Capillary: 131 mg/dL — ABNORMAL HIGH (ref 70–99)

## 2014-01-11 LAB — URINALYSIS, ROUTINE W REFLEX MICROSCOPIC
GLUCOSE, UA: 100 mg/dL — AB
HGB URINE DIPSTICK: NEGATIVE
Ketones, ur: 15 mg/dL — AB
Nitrite: NEGATIVE
Specific Gravity, Urine: 1.019 (ref 1.005–1.030)
Urobilinogen, UA: 0.2 mg/dL (ref 0.0–1.0)
pH: 5.5 (ref 5.0–8.0)

## 2014-01-11 LAB — TYPE AND SCREEN
ABO/RH(D): O POS
Antibody Screen: POSITIVE
DAT, IgG: NEGATIVE
Unit division: 0

## 2014-01-11 LAB — LACTATE DEHYDROGENASE: LDH: 517 U/L — ABNORMAL HIGH (ref 94–250)

## 2014-01-11 LAB — RETICULOCYTES
RBC.: 1.76 MIL/uL — ABNORMAL LOW (ref 3.87–5.11)
RETIC CT PCT: 1.2 % (ref 0.4–3.1)
Retic Count, Absolute: 21.1 10*3/uL (ref 19.0–186.0)

## 2014-01-11 LAB — SODIUM, URINE, RANDOM: Sodium, Ur: <20 mEq/L

## 2014-01-11 LAB — CREATININE, URINE, RANDOM: Creatinine, Urine: 107.14 mg/dL

## 2014-01-11 MED ORDER — DARBEPOETIN ALFA-POLYSORBATE 40 MCG/0.4ML IJ SOLN
40.0000 ug | INTRAMUSCULAR | Status: DC
  Administered 2014-01-11: 40 ug via SUBCUTANEOUS
  Filled 2014-01-11: qty 0.4

## 2014-01-11 NOTE — Progress Notes (Signed)
Subjective: No fever or pseudoseizure overnight. No bleeding noted with her BM. She had no complaints this morning.   Objective: Vital signs in last 24 hours: Filed Vitals:   01/10/14 2005 01/10/14 2040 01/10/14 2148 01/11/14 0509  BP: 122/70 124/68 137/74 134/77  Pulse: 76 78 81 73  Temp: 98.9 F (37.2 C) 98.7 F (37.1 C) 99.4 F (37.4 C) 98.8 F (37.1 C)  TempSrc: Oral Oral Oral Oral  Resp: 17 18 18 16   Height:      Weight:      SpO2: 100% 100% 98% 96%   Weight change:   Intake/Output Summary (Last 24 hours) at 01/11/14 0859 Last data filed at 01/11/14 1610  Gross per 24 hour  Intake  987.5 ml  Output   1000 ml  Net  -12.5 ml   Vital signs reviewed  Gen; Sitting up in bed in NAD, following commands appropriately  HEENT: no scleral icterus, MM  CV: RRR Chest: Peripheral IV to her left chest with ecchymosis surrounding it that extends to her medial left arm with no TTP, no increased warmth, no erythema  Resp: bibasilar crackles bilaterally, normal respiratory effort, no wheezing  Abd: mildly distended, non tender, soft, bowel sounds present, no caput medusa.  Ext: no peripheral edema, dry skin, no teleangiectasia. Splinter hemorrhage in left 5th finger  Neuro: Alert and oriented x3, no slurred speech, no asterixis, moves her extremities voluntairly  Lab Results: Basic Metabolic Panel:  Recent Labs Lab 01/08/14 2340  01/10/14 0540 01/11/14 0615  NA 136*  < > 139 134*  K 3.5*  < > 3.6* 3.8  CL 103  < > 108 103  CO2 16*  < > 16* 14*  GLUCOSE 256*  < > 246* 226*  BUN 49*  < > 55* 58*  CREATININE 2.77*  < > 3.08* 3.28*  CALCIUM 7.2*  < > 7.4* 7.7*  MG 1.7  --   --   --   PHOS 3.2  --   --   --   < > = values in this interval not displayed. Liver Function Tests:  Recent Labs Lab 01/10/14 0540 01/11/14 0615  AST 67* 58*  ALT 8 <5  ALKPHOS 499* 544*  BILITOT 4.0* 3.5*  PROT 5.2* 5.5*  ALBUMIN 1.4* 1.4*   CBC:  Recent Labs Lab 01/09/14 1015  01/10/14 0540 01/11/14 0047  WBC 8.6 7.7 6.7  NEUTROABS 6.8 5.6  --   HGB 7.0* 6.8* 6.6*  HCT 20.9* 20.4* 20.1*  MCV 105.0* 107.9* 107.5*  PLT 125* 138* 155   Cardiac Enzymes:  Recent Labs Lab 01/08/14 2340 01/09/14 0612 01/09/14 1153  CKTOTAL  --  59  --   TROPONINI 0.34* <0.30 <0.30   CBG:  Recent Labs Lab 01/09/14 2143 01/10/14 0800 01/10/14 1154 01/10/14 1652 01/10/14 2149 01/11/14 0746  GLUCAP 202* 210* 177* 231* 264* 232*   Coagulation:  Recent Labs Lab 01/08/14 0925  LABPROT 12.0  INR 0.89    Micro Results: Recent Results (from the past 240 hour(s))  MRSA PCR SCREENING     Status: Abnormal   Collection Time    01/04/14  4:35 AM      Result Value Ref Range Status   MRSA by PCR POSITIVE (*) NEGATIVE Final   Comment:            The GeneXpert MRSA Assay (FDA     approved for NASAL specimens     only), is one component of a  comprehensive MRSA colonization     surveillance program. It is not     intended to diagnose MRSA     infection nor to guide or     monitor treatment for     MRSA infections.     RESULT CALLED TO, READ BACK BY AND VERIFIED WITH:     R.ZELLNER,RN [id number] 01/04/14 M.CAMPBELL  CULTURE, BLOOD (ROUTINE X 2)     Status: None   Collection Time    01/05/14  3:30 PM      Result Value Ref Range Status   Specimen Description BLOOD LEFT ARM   Final   Special Requests     Final   Value: BOTTLES DRAWN AEROBIC AND ANAEROBIC 10CC BLUE, 5CC RED   Culture  Setup Time     Final   Value: 01/05/2014 22:48     Performed at [facility]   Culture     Final   Value: STAPHYLOCOCCUS AUREUS     Note: RIFAMPIN AND GENTAMICIN SHOULD NOT BE USED AS SINGLE DRUGS FOR TREATMENT OF STAPH INFECTIONS.     Note: Gram Stain Report Called to,Read Back By and Verified With: Volanda Napoleon RN on 01/06/14 at 06:45 by Christie Nottingham     Performed at [facility]   Report Status 01/08/2014 FINAL   Final   Organism ID, Bacteria STAPHYLOCOCCUS AUREUS    Final  CULTURE, BLOOD (ROUTINE X 2)     Status: None   Collection Time    01/05/14  3:42 PM      Result Value Ref Range Status   Specimen Description BLOOD BLOOD LEFT FOREARM   Final   Special Requests BOTTLES DRAWN AEROBIC ONLY 5CC   Final   Culture  Setup Time     Final   Value: 01/05/2014 22:48     Performed at [facility]   Culture     Final   Value: STAPHYLOCOCCUS AUREUS     Note: SUSCEPTIBILITIES PERFORMED ON PREVIOUS CULTURE WITHIN THE LAST 5 DAYS.     Note: Gram Stain Report Called to,Read Back By and Verified With: Volanda Napoleon RN on 01/06/14 at 06:45 by Christie Nottingham     Performed at [facility]   Report Status 01/08/2014 FINAL   Final  CULTURE, BLOOD (ROUTINE X 2)     Status: None   Collection Time    01/06/14  3:35 PM      Result Value Ref Range Status   Specimen Description BLOOD LEFT ANTECUBITAL   Final   Special Requests     Final   Value: BOTTLES DRAWN AEROBIC AND ANAEROBIC 10CC AER 5CC ANA   Culture  Setup Time     Final   Value: 01/06/2014 19:02     Performed at [facility]   Culture     Final   Value: STAPHYLOCOCCUS AUREUS     Note: RIFAMPIN AND GENTAMICIN SHOULD NOT BE USED AS SINGLE DRUGS FOR TREATMENT OF STAPH INFECTIONS.     Note: Gram Stain Report Called to,Read Back By and Verified With: Junius Argyle 01/08/14 AT 0100 RIDK     Performed at [facility]   Report Status 01/10/2014 FINAL   Final   Organism ID, Bacteria STAPHYLOCOCCUS AUREUS   Final  CULTURE, BLOOD (ROUTINE X 2)     Status: None   Collection Time    01/06/14  3:45 PM      Result Value Ref Range Status   Specimen Description BLOOD  LEFT HAND   Final   Special Requests     Final   Value: BOTTLES DRAWN AEROBIC AND ANAEROBIC 10CC AER 5CC ANA   Culture  Setup Time     Final   Value: 01/06/2014 19:00     [facility]   Culture     Final   Value:        BLOOD CULTURE RECEIVED NO GROWTH TO DATE CULTURE WILL BE HELD FOR 5 DAYS BEFORE ISSUING  A FINAL NEGATIVE REPORT     [facility]   Report Status PENDING   Incomplete  CULTURE, BLOOD (ROUTINE X 2)     Status: None   Collection Time    01/08/14  6:56 AM      Result Value Ref Range Status   Specimen Description BLOOD LEFT HAND   Final   Special Requests BOTTLES DRAWN AEROBIC AND ANAEROBIC 10CC   Final   Culture  Setup Time     Final   Value: 01/08/2014 10:12     [facility]   Culture     Final   Value:        BLOOD CULTURE RECEIVED NO GROWTH TO DATE CULTURE WILL BE HELD FOR 5 DAYS BEFORE ISSUING A FINAL NEGATIVE REPORT     [facility]   Report Status PENDING   Incomplete  CULTURE, BLOOD (ROUTINE X 2)     Status: None   Collection Time    01/08/14  7:05 AM      Result Value Ref Range Status   Specimen Description BLOOD BLOOD LEFT FOREARM   Final   Special Requests BOTTLES DRAWN AEROBIC ONLY [facility]7CC   Final   Culture  Setup Time     Final   Value: 01/08/2014 10:12     [facility] at [facility]   Culture     Final   Value:        BLOOD CULTURE RECEIVED NO GROWTH TO DATE CULTURE WILL BE HELD FOR 5 DAYS BEFORE ISSUING A FINAL NEGATIVE REPORT     [facility] at [facility]   Report Status PENDING   Incomplete   Studies/Results: Ct Abdomen Pelvis Wo Contrast  01/11/2014   CLINICAL DATA:  Sepsis.  Fever.  EXAM: CT ABDOMEN AND PELVIS WITHOUT CONTRAST  TECHNIQUE: Multidetector CT imaging of the abdomen and pelvis was performed following the standard protocol without IV contrast.  COMPARISON:  01/04/2014  FINDINGS: Increasing dependent bibasilar opacities, likely atelectasis. Heart is normal size. Trace effusions present.  Subtle nodular contours of the liver are again noted suggesting cirrhosis. No visible focal abnormality on this unenhanced study. Spleen, pancreas, kidneys have an unremarkable unenhanced appearance. Right adrenal unremarkable. Mild diffuse enlargement of the left renal gland compatible with  hyperplasia.  Small amount of free fluid in the cul-de-sac. Uterus, adnexae and urinary bladder are unremarkable. Stomach, large and small bowel are grossly unremarkable. Appendix is visualized and is normal. No free air or adenopathy. Aorta is normal caliber.  No acute bony abnormality or focal bone lesion.  IMPRESSION: Increasing bibasilar densities, likely atelectasis. Suspect trace effusions.  Mildly nodular contours of the liver again noted suggesting cirrhosis.  Trace free fluid in the pelvis.  No acute findings.   Electronically Signed   By: Charlett NoseKevin  Dover M.D.   On: 01/11/2014 00:05   Mr Thoracic Spine Wo Contrast  01/11/2014   CLINICAL DATA:  Evaluate fever of unknown  origin.  EXAM: MRI THORACIC AND LUMBAR SPINE WITHOUT CONTRAST  TECHNIQUE: Multiplanar and multiecho pulse sequences of the thoracic and lumbar spine were obtained without intravenous contrast.  COMPARISON:  CT of the abdomen and pelvis January 14, 2014  FINDINGS: MR THORACIC SPINE FINDINGS  Thoracic vertebral bodies and posterior elements are intact and aligned with maintenance of thoracic kyphosis. Sub cm bright T1 and bright T2 hemangioma T7. No abnormal STIR signal. Intervertebral discs demonstrate normal morphology and signal characteristics.  The ventral thoracic spinal cord deformity due to disc protrusion as described below, the spinal cord is overall normal in morphology and signal characteristics the conus medullaris which terminates at T12-L1. No abnormal epidural fluid collections. Partially imaged small bilateral pleural effusions. Mild subcutaneous dependent edema within the lower thoracic spine.  Minimal annular bulging at C6-7. At T7-8 is moderate left central disc protrusion with possible annular fissure resulting in mild to moderate canal stenosis, left ventral spinal cord deformity. Possible small left subarticular to extra foraminal T8-9 disc protrusion resulting in moderate left T8-9 neural foraminal narrowing.  MR LUMBAR  SPINE FINDINGS  Lumbar vertebral bodies and posterior elements are intact and aligned and maintenance of lumbar lordosis. Intervertebral discs demonstrate normal morphology and signal characteristics. No abnormal bone marrow signal, specifically no bright STIR signal to suggest acute osseous process.  Conus medullaris terminates at T12-L1 and appears normal in morphology and signal characteristics. 1 mm bright T1 signal along the filum terminale may reflect fibrolipomatous changes without cord tethering. Mild paraspinal muscle atrophy. Dependent subcutaneous edema and grade 1 paraspinal muscle strain.  Level by level evaluation:  T12-L1, L1-2, L2-3, L3-4 and L4-5: No disc bulge. Mild facet arthropathy without canal stenosis or neural foraminal narrowing.  L5-S1: No disc bulge. Moderate facet arthropathy. No canal stenosis. Mild neural foraminal narrowing.  IMPRESSION: MR THORACIC SPINE IMPRESSION  No findings of discitis, osteomyelitis nor epidural abscess on this nonenhanced examination.  Thoracic spondylosis, including T7-8 moderate left central disc protrusion resulting in mild to moderate canal stenosis. Possible left subarticular and T8-9 small disc protrusion resulting in moderate left T8-9 neural foraminal narrowing.  MR LUMBAR SPINE IMPRESSION  No findings of discitis, osteomyelitis nor epidural abscess on this nonenhanced examination.  Grade 1 paraspinal muscle strain.  Facet arthropathy without neurocompressive changes.   Electronically Signed   By: Awilda Metro   On: 01/11/2014 00:13   Mr Lumbar Spine Wo Contrast  01/11/2014   CLINICAL DATA:  Evaluate fever of unknown origin.  EXAM: MRI THORACIC AND LUMBAR SPINE WITHOUT CONTRAST  TECHNIQUE: Multiplanar and multiecho pulse sequences of the thoracic and lumbar spine were obtained without intravenous contrast.  COMPARISON:  CT of the abdomen and pelvis January 14, 2014  FINDINGS: MR THORACIC SPINE FINDINGS  Thoracic vertebral bodies and posterior  elements are intact and aligned with maintenance of thoracic kyphosis. Sub cm bright T1 and bright T2 hemangioma T7. No abnormal STIR signal. Intervertebral discs demonstrate normal morphology and signal characteristics.  The ventral thoracic spinal cord deformity due to disc protrusion as described below, the spinal cord is overall normal in morphology and signal characteristics the conus medullaris which terminates at T12-L1. No abnormal epidural fluid collections. Partially imaged small bilateral pleural effusions. Mild subcutaneous dependent edema within the lower thoracic spine.  Minimal annular bulging at C6-7. At T7-8 is moderate left central disc protrusion with possible annular fissure resulting in mild to moderate canal stenosis, left ventral spinal cord deformity. Possible small left subarticular to extra foraminal T8-9  disc protrusion resulting in moderate left T8-9 neural foraminal narrowing.  MR LUMBAR SPINE FINDINGS  Lumbar vertebral bodies and posterior elements are intact and aligned and maintenance of lumbar lordosis. Intervertebral discs demonstrate normal morphology and signal characteristics. No abnormal bone marrow signal, specifically no bright STIR signal to suggest acute osseous process.  Conus medullaris terminates at T12-L1 and appears normal in morphology and signal characteristics. 1 mm bright T1 signal along the filum terminale may reflect fibrolipomatous changes without cord tethering. Mild paraspinal muscle atrophy. Dependent subcutaneous edema and grade 1 paraspinal muscle strain.  Level by level evaluation:  T12-L1, L1-2, L2-3, L3-4 and L4-5: No disc bulge. Mild facet arthropathy without canal stenosis or neural foraminal narrowing.  L5-S1: No disc bulge. Moderate facet arthropathy. No canal stenosis. Mild neural foraminal narrowing.  IMPRESSION: MR THORACIC SPINE IMPRESSION  No findings of discitis, osteomyelitis nor epidural abscess on this nonenhanced examination.  Thoracic  spondylosis, including T7-8 moderate left central disc protrusion resulting in mild to moderate canal stenosis. Possible left subarticular and T8-9 small disc protrusion resulting in moderate left T8-9 neural foraminal narrowing.  MR LUMBAR SPINE IMPRESSION  No findings of discitis, osteomyelitis nor epidural abscess on this nonenhanced examination.  Grade 1 paraspinal muscle strain.  Facet arthropathy without neurocompressive changes.   Electronically Signed   By: Awilda Metro   On: 01/11/2014 00:13   Medications: I have reviewed the patient's current medications. Scheduled Meds: . sodium chloride   Intravenous Once  . abacavir  300 mg Oral BID  . acyclovir  800 mg Oral Daily  . aspirin EC  81 mg Oral Daily  .  ceFAZolin (ANCEF) IV  2 g Intravenous Q12H  . cloNIDine  0.3 mg Oral TID  . Darunavir Ethanolate  800 mg Oral Q breakfast  . enoxaparin (LOVENOX) injection  30 mg Subcutaneous Q24H  . hydrALAZINE  50 mg Oral 3 times per day  . hydrocerin   Topical BID  . insulin aspart  0-9 Units Subcutaneous TID WC  . insulin glargine  20 Units Subcutaneous QHS  . labetalol  600 mg Oral BID  . lacosamide  150 mg Oral BID  . lactulose  20 g Oral TID  . lamiVUDine  50 mg Oral Daily  . levETIRAcetam  500 mg Oral BID  . magic mouthwash w/lidocaine  5 mL Oral TID  . pantoprazole  40 mg Oral Daily  . ritonavir  100 mg Oral Q breakfast  . sodium bicarbonate  650 mg Oral BID  . sodium chloride  3 mL Intravenous Q12H   Continuous Infusions:  PRN Meds:.acetaminophen, hydrOXYzine, menthol-cetylpyridinium, ondansetron (ZOFRAN) IV, ondansetron Assessment/Plan: Ms. 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 Her mental status is unchanged. She had fever with Tmax 102.70F on 8/9 with BC at that time that grew MSSA in 2/2 vials. She received vancomycin for 2 days and has been on cefazolin for 4 days.The source of her bacteremia was likely her port-a-cath placed on 12/05/13 which was removed on 8/11. However, she continued to spike fevers until the eve of 8/13. SBP less likely given minimum ascites, no abdominal pain.  No signs  of endocarditis on exam with no new murmur. TEE with no endocarditis. MRI lumbar/thoracic spine negative for epidural abscess. CT abd/pelvis negative for intraabdominal process,   -Continue cefazolin, will need PICC line once afebrile for 48 hrs--for tx 4 weeks total  -Continue monitoring for s/s of SBP  -Order [facility] x2 again if she spikes a fever overnight.   Acute on chronic Anemia: Baseline Hg of 6-8. Dropped to 6.8 on 8/14. She received 1 unit of pRBC but her post-transfusion CBC was 6.6. She had bleeding around her peripheral IV overnight but denies hemoptysis, hematuria, hematochezia, or melena.  She has been worked up for hemolytic anemia in the past with: - Coombs+ with negative IgG Coombs (elution was IgG positive agglutionation) but Wilmer Floor antibody negative.  -GI performed EGD and colonoscopy on 7/23for +FOBT: nodular gastric mucosa and normal colon, terminal ileum which yielded benign pathology of the colon, ileum, and duodenum with minimal chronic inflammation. -She has been evaluated in the inpatient setting by Dr. Cyndie Chime in Hematology/Concology who recommends pRBC transfusion only if she is symptomatic.  -She had bone marrow bx on 7/2 inconclusive and non-diagnostic. She is on ART which can cause suppression of all cell lines.  -She has received Aranesp injection recently per Dr. Sibyl Parr in Nephrology, and seemed to have maintained her Hg stable for a while.  -Retic count unremarkable, LDH elevated but in setting of large ecchymosis in her  left upper chest and arm. Plan:  -Aranesp weekly per Dr. Arlean Hopping in Nephrology  -CBC in am -FOTB  Acute on Chronic Renal Failure : Cr still elevated, trending up to 3.38 today from 3.08 yesterday. Baseline Cr in the 2.5-2.8 range. This is likely multifactorial from furosemide use, ACEi use, and possibly Vancomycin use.  -Strict in and outs  -Hold Lasix and ACEi  -CMP in am  -Nephrology consulted, per Dr. Arlean Hopping the pt is not a candidate for hemodialysis   Hepatic Encephalopathy: Resolved. She arrived lethargic which was concerning for acute hepatic encephalopathy. Ammonia level on admission was 127 and increased to 161, but she is back to her baseline mental status today.  -Continue home lactulose PO PRN titrated to 3BM/day  -Diet heart healthy/ Carb mod only if fully alert   Ascites: Soft and obese abdomen, only mildly distended and not tense. CT abdomen/pelvis on presentation with only mild ascities, no acute intraabdominal process. She had mild RUQ pain but [place] Abd was unremarkable for acute gall bladder disease.  CT/Abd pelvis with no acute intraabdominal process. Not concerned for SBP at this time. Bilirubin trending down at 3.5 today.  -Will consider diagnostic/therapeutic tap of ascitic fluid if she declines  -CMP in am   History of seizure: She was Dilantin at home which can be sedating. This may have precipitated her somnolence, as her corrected level was elevated at 25 on presentation. Dilantin has been discontinued. Had pseudoseizures on eve of 8/12 and 8/13 with voluntary jerking movements, following commands, with no confusion.  -Continue vimpat  -Continue Keppra   Hypokalemia: resolved. K is 3.8 today. Will continue monitoring and provide gentle repletion given her CKD4 if K is at or <3.0.   Hepatitis C: Genotype in December 2014. Genotype 1b, viral load of [id number], Log of 6.63.  -She will f/u with Dr. Drue Second in ID for possible treatment of Hep C   HIV disease: Well  controlled with viral load undectable in 09/08/13. CD4 count in April at 280. Will recheck labs as dilantin level has been  super therapeutic and can interact with antiretrovirals  -Repeat CD4  -Recheck viral load  -Continue home abacavir, prezista, norvir, and epivir  -Continue acyclovir for prophylaxis   HTN: Elevated today in setting of holding antihypertensives last night.  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 Secondary to Hepatitis C infection. 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 Platelets and INR stable.   DMII: Last Hg A1C 7.5% on 11/13/13. On Lantus 30 u qHS and Novolog 10-25 u actid at home.  -Lantus 20 units qHS  -Sensitive SSI   DVT prophylaxis: SCDs   Diet:  HH, Carb mod   Dispo: Disposition is deferred at this time, awaiting improvement of current medical problems. Anticipated discharge in approximately 2-3 day(s). She needs SNF placement while on Abx tx for 4 weeks as her sister will not be able to manage the PICC line and Abx.   The patient does have a current PCP Christen Bame, MD) and does need an [facility] hospital follow-up appointment after discharge.  The patient does have transportation limitations that hinder transportation to clinic appointments.  .Services Needed at time of discharge: Y = Yes, Blank = No PT:   OT:   RN:   Equipment:   Other:     LOS: 8 days   Ky Barban, MD 01/11/2014, 8:59 AM

## 2014-01-11 NOTE — Progress Notes (Signed)
Pt has been voiding. New orders to place foley catheter in. MD Schertz clarified if he still wants foley placed in patient even if patient is voiding. MD Schertz telephone order to still place foley in to measure patient's output.

## 2014-01-11 NOTE — Progress Notes (Signed)
Internal Medicine Attending  Date: 01/11/2014  Patient name: Michelle [name] Medical record number: [id number] Date of birth: Jun 20, 1960 Age: 52 y.o. Gender: female  I saw and evaluated the patient. I discussed patient and reviewed the resident's note by Dr. Garald Braver, and I agree with the resident's findings and plans as documented in her note, with the following additional comments.  Given chronic kidney disease, would discuss best option for IV access with nephrology prior to PICC placement, to make sure we consider any future need for access for hemodialysis.

## 2014-01-11 NOTE — Progress Notes (Signed)
Notified provider on call of patient's hemoglobin of 6.6 post transfusion. Pt not in any distress. No orders given. Will continue to monitor patient.

## 2014-01-11 NOTE — Progress Notes (Signed)
Patient's peripheral IV site to RUE appears reddened and swollen. MD Kennerly aware. IV team paged for assessment. Per IV team, remove peripheral IV. IV tream will place another peripheral IV. Pt refused treatement to site at this time. Will continue to monitor site.

## 2014-01-11 NOTE — Progress Notes (Signed)
(  R)chest wound from port removal site looks good. Dressing clean and intact RN has been slowly removing gauze packing as wound contracts. IR will cont to follow  Brayton El PA-C Interventional Radiology 01/11/2014 1:00 PM
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
## 2014-01-12 LAB — PROTIME-INR
INR: 0.95 (ref 0.00–1.49)
Prothrombin Time: 12.7 seconds (ref 11.6–15.2)

## 2014-01-12 LAB — COMPREHENSIVE METABOLIC PANEL
ALK PHOS: 574 U/L — AB (ref 39–117)
ALT: <5 U/L (ref 0–35)
ANION GAP: 16 — AB (ref 5–15)
AST: 58 U/L — ABNORMAL HIGH (ref 0–37)
Albumin: 1.3 g/dL — ABNORMAL LOW (ref 3.5–5.2)
BUN: 55 mg/dL — AB (ref 6–23)
CO2: 13 mEq/L — ABNORMAL LOW (ref 19–32)
Calcium: 7.8 mg/dL — ABNORMAL LOW (ref 8.4–10.5)
Chloride: 107 mEq/L (ref 96–112)
Creatinine, Ser: 3.19 mg/dL — ABNORMAL HIGH (ref 0.50–1.10)
GFR calc non Af Amer: 15 mL/min — ABNORMAL LOW (ref >90)
GFR, EST AFRICAN AMERICAN: 18 mL/min — AB (ref >90)
GLUCOSE: 221 mg/dL — AB (ref 70–99)
Potassium: 3.8 mEq/L (ref 3.7–5.3)
Sodium: 136 mEq/L — ABNORMAL LOW (ref 137–147)
Total Bilirubin: 3.6 mg/dL — ABNORMAL HIGH (ref 0.3–1.2)
Total Protein: 5.3 g/dL — ABNORMAL LOW (ref 6.0–8.3)

## 2014-01-12 LAB — CBC
HEMATOCRIT: 21.3 % — AB (ref 36.0–46.0)
Hemoglobin: 7 g/dL — ABNORMAL LOW (ref 12.0–15.0)
MCH: 35.7 pg — AB (ref 26.0–34.0)
MCHC: 32.9 g/dL (ref 30.0–36.0)
MCV: 108.7 fL — ABNORMAL HIGH (ref 78.0–100.0)
Platelets: 202 10*3/uL (ref 150–400)
RBC: 1.96 MIL/uL — ABNORMAL LOW (ref 3.87–5.11)
RDW: 14.3 % (ref 11.5–15.5)
WBC: 7.6 10*3/uL (ref 4.0–10.5)

## 2014-01-12 LAB — GLUCOSE, CAPILLARY
GLUCOSE-CAPILLARY: 215 mg/dL — AB (ref 70–99)
Glucose-Capillary: 277 mg/dL — ABNORMAL HIGH (ref 70–99)
Glucose-Capillary: 264 mg/dL — ABNORMAL HIGH (ref 70–99)
Glucose-Capillary: 209 mg/dL — ABNORMAL HIGH (ref 70–99)

## 2014-01-12 MED ORDER — LABETALOL HCL 300 MG PO TABS
300.0000 mg | ORAL_TABLET | Freq: Two times a day (BID) | ORAL | Status: DC
  Administered 2014-01-12 – 2014-01-15 (×6): 300 mg via ORAL
  Filled 2014-01-12 (×7): qty 1

## 2014-01-12 MED ORDER — PANTOPRAZOLE SODIUM 20 MG PO TBEC
20.0000 mg | DELAYED_RELEASE_TABLET | Freq: Once | ORAL | Status: AC
  Administered 2014-01-12: 20 mg via ORAL
  Filled 2014-01-12: qty 1

## 2014-01-12 MED ORDER — CLONIDINE HCL 0.2 MG PO TABS
0.2000 mg | ORAL_TABLET | Freq: Two times a day (BID) | ORAL | Status: DC
  Administered 2014-01-12 – 2014-01-14 (×4): 0.2 mg via ORAL
  Filled 2014-01-12 (×5): qty 1

## 2014-01-12 MED ORDER — DIPHENHYDRAMINE-ZINC ACETATE 2-0.1 % EX CREA
TOPICAL_CREAM | Freq: Every day | CUTANEOUS | Status: DC | PRN
  Administered 2014-01-13 – 2014-01-14 (×2): via TOPICAL
  Filled 2014-01-12: qty 28

## 2014-01-12 MED ORDER — HYDRALAZINE HCL 25 MG PO TABS
25.0000 mg | ORAL_TABLET | Freq: Three times a day (TID) | ORAL | Status: DC
  Administered 2014-01-12 – 2014-01-13 (×3): 25 mg via ORAL
  Filled 2014-01-12 (×6): qty 1

## 2014-01-12 NOTE — Progress Notes (Signed)
Subjective: She had Tmax of 100.28F overnight. No seizure activity. She complains of chest wall tenderness today across her chest with tenderness to palpation. No active bleeding.   Objective: Vital signs in last 24 hours: Filed Vitals:   01/11/14 1400 01/11/14 2053 01/11/14 2314 01/12/14 0504  BP: 121/72 160/72 155/77 135/71  Pulse: 74 79 100 79  Temp: 97.9 F (36.6 C) 100.3 F (37.9 C)  100.1 F (37.8 C)  TempSrc: Oral Oral  Oral  Resp: 18 18  18   Height:      Weight: 184 lb 11.9 oz (83.8 kg)   185 lb 3 oz (84 kg)  SpO2: 97% 99%  99%   Weight change:   Intake/Output Summary (Last 24 hours) at 01/12/14 1610 Last data filed at 01/12/14 9604  Gross per 24 hour  Intake   1055 ml  Output    950 ml  Net    105 ml  Vital signs reviewed  Gen; Sitting up in bed in NAD, following commands appropriately  HEENT: no scleral icterus, MM  CV: RRR  Chest: Peripheral IV to her left chest with ecchymosis surrounding it that extends to her medial left arm with TTP, no increased warmth, no erythema. Right chest with site of port-a-cath that was removed with no edema/erythema, covered with dressing that is c/d/i.  Resp: bibasilar crackles bilaterally, normal respiratory effort, no wheezing  Abd: mildly distended, non tender, soft, bowel sounds present, no caput medusa.  Ext: no peripheral edema, dry skin, no teleangiectasia.  Neuro: Alert and oriented x3, no slurred speech, no asterixis, moves her extremities voluntairly  Lab Results: Basic Metabolic Panel:  Recent Labs Lab 01/08/14 2340  01/11/14 0615 01/12/14 0643  NA 136*  < > 134* 136*  K 3.5*  < > 3.8 3.8  CL 103  < > 103 107  CO2 16*  < > 14* 13*  GLUCOSE 256*  < > 226* 221*  BUN 49*  < > 58* 55*  CREATININE 2.77*  < > 3.28* 3.19*  CALCIUM 7.2*  < > 7.7* 7.8*  MG 1.7  --   --   --   PHOS 3.2  --   --   --   < > = values in this interval not displayed. Liver Function Tests:  Recent Labs Lab 01/11/14 0615  01/12/14 0643  AST 58* 58*  ALT <5 <5  ALKPHOS 544* 574*  BILITOT 3.5* 3.6*  PROT 5.5* 5.3*  ALBUMIN 1.4* 1.3*   CBC:  Recent Labs Lab 01/09/14 1015 01/10/14 0540 01/11/14 0047  WBC 8.6 7.7 6.7  NEUTROABS 6.8 5.6  --   HGB 7.0* 6.8* 6.6*  HCT 20.9* 20.4* 20.1*  MCV 105.0* 107.9* 107.5*  PLT 125* 138* 155   Cardiac Enzymes:  Recent Labs Lab 01/08/14 2340 01/09/14 0612 01/09/14 1153  CKTOTAL  --  59  --   TROPONINI 0.34* <0.30 <0.30   CBG:  Recent Labs Lab 01/10/14 1652 01/10/14 2149 01/11/14 0746 01/11/14 1219 01/11/14 1640 01/11/14 2138  GLUCAP 231* 264* 232* 290* 274* 131*   Coagulation:  Recent Labs Lab 01/08/14 0925  LABPROT 12.0  INR 0.89   Anemia Panel:  Recent Labs Lab 01/11/14 1057  RETICCTPCT 1.2   Urine Drug Screen: Drugs of Abuse     Component Value Date/Time   LABOPIA NONE DETECTED 01/03/2014 2311   LABOPIA NEG 11/13/2013 1516   COCAINSCRNUR NONE DETECTED 01/03/2014 2311   COCAINSCRNUR NEG 11/13/2013 1516   LABBENZ NONE  DETECTED 01/03/2014 2311   LABBENZ NEG 11/13/2013 1516   AMPHETMU NONE DETECTED 01/03/2014 2311   THCU NONE DETECTED 01/03/2014 2311   LABBARB NONE DETECTED 01/03/2014 2311   LABBARB NEG 11/13/2013 1516    Urinalysis:  Recent Labs Lab 01/06/14 1141 01/11/14 1627  COLORURINE ORANGE* AMBER*  LABSPEC 1.021 1.019  PHURINE 5.5 5.5  GLUCOSEU 100* 100*  HGBUR NEGATIVE NEGATIVE  BILIRUBINUR LARGE* LARGE*  KETONESUR NEGATIVE 15*  PROTEINUR >300* >300*  UROBILINOGEN 1.0 0.2  NITRITE NEGATIVE NEGATIVE  LEUKOCYTESUR TRACE* SMALL*    Micro Results: Recent Results (from the past 240 hour(s))  MRSA PCR SCREENING     Status: Abnormal   Collection Time    01/04/14  4:35 AM      Result Value Ref Range Status   MRSA by PCR POSITIVE (*) NEGATIVE Final   Comment:            The GeneXpert MRSA Assay (FDA     approved for NASAL specimens     only), is one component of a     comprehensive MRSA colonization     surveillance  program. It is not     intended to diagnose MRSA     infection nor to guide or     monitor treatment for     MRSA infections.     RESULT CALLED TO, READ BACK BY AND VERIFIED WITH:     R.ZELLNER,RN 1610 01/04/14 M.CAMPBELL  CULTURE, BLOOD (ROUTINE X 2)     Status: None   Collection Time    01/05/14  3:30 PM      Result Value Ref Range Status   Specimen Description BLOOD LEFT ARM   Final   Special Requests     Final   Value: BOTTLES DRAWN AEROBIC AND ANAEROBIC 10CC BLUE, 5CC RED   Culture  Setup Time     Final   Value: 01/05/2014 22:48     Performed at [facility]   Culture     Final   Value: STAPHYLOCOCCUS AUREUS     Note: RIFAMPIN AND GENTAMICIN SHOULD NOT BE USED AS SINGLE DRUGS FOR TREATMENT OF STAPH INFECTIONS.     Note: Gram Stain Report Called to,Read Back By and Verified With: Volanda Napoleon RN on 01/06/14 at 06:45 by Christie Nottingham     Performed at [facility]   Report Status 01/08/2014 FINAL   Final   Organism ID, Bacteria STAPHYLOCOCCUS AUREUS   Final  CULTURE, BLOOD (ROUTINE X 2)     Status: None   Collection Time    01/05/14  3:42 PM      Result Value Ref Range Status   Specimen Description BLOOD BLOOD LEFT FOREARM   Final   Special Requests BOTTLES DRAWN AEROBIC ONLY 5CC   Final   Culture  Setup Time     Final   Value: 01/05/2014 22:48     Performed at [facility]   Culture     Final   Value: STAPHYLOCOCCUS AUREUS     Note: SUSCEPTIBILITIES PERFORMED ON PREVIOUS CULTURE WITHIN THE LAST 5 DAYS.     Note: Gram Stain Report Called to,Read Back By and Verified With: Volanda Napoleon RN on 01/06/14 at 06:45 by Christie Nottingham     Performed at [facility]   Report Status 01/08/2014 FINAL   Final  CULTURE, BLOOD (ROUTINE X 2)     Status: None   Collection Time    01/06/14  3:35 PM  Result Value Ref Range Status   Specimen Description BLOOD LEFT ANTECUBITAL   Final   Special Requests     Final   Value: BOTTLES DRAWN AEROBIC AND ANAEROBIC  10CC AER 5CC ANA   Culture  Setup Time     Final   Value: 01/06/2014 19:02     Performed at [facility]   Culture     Final   Value: STAPHYLOCOCCUS AUREUS     Note: RIFAMPIN AND GENTAMICIN SHOULD NOT BE USED AS SINGLE DRUGS FOR TREATMENT OF STAPH INFECTIONS.     Note: Gram Stain Report Called to,Read Back By and Verified With: Junius Argyle 01/08/14 AT 0100 RIDK     Performed at [facility]   Report Status 01/10/2014 FINAL   Final   Organism ID, Bacteria STAPHYLOCOCCUS AUREUS   Final  CULTURE, BLOOD (ROUTINE X 2)     Status: None   Collection Time    01/06/14  3:45 PM      Result Value Ref Range Status   Specimen Description BLOOD LEFT HAND   Final   Special Requests     Final   Value: BOTTLES DRAWN AEROBIC AND ANAEROBIC 10CC AER 5CC ANA   Culture  Setup Time     Final   Value: 01/06/2014 19:00     Performed at [facility]   Culture     Final   Value:        BLOOD CULTURE RECEIVED NO GROWTH TO DATE CULTURE WILL BE HELD FOR 5 DAYS BEFORE ISSUING A FINAL NEGATIVE REPORT     Performed at [facility]   Report Status PENDING   Incomplete  CULTURE, BLOOD (ROUTINE X 2)     Status: None   Collection Time    01/08/14  6:56 AM      Result Value Ref Range Status   Specimen Description BLOOD LEFT HAND   Final   Special Requests BOTTLES DRAWN AEROBIC AND ANAEROBIC 10CC   Final   Culture  Setup Time     Final   Value: 01/08/2014 10:12     Performed at [facility]   Culture     Final   Value:        BLOOD CULTURE RECEIVED NO GROWTH TO DATE CULTURE WILL BE HELD FOR 5 DAYS BEFORE ISSUING A FINAL NEGATIVE REPORT     Performed at [facility]   Report Status PENDING   Incomplete  CULTURE, BLOOD (ROUTINE X 2)     Status: None   Collection Time    01/08/14  7:05 AM      Result Value Ref Range Status   Specimen Description BLOOD BLOOD LEFT FOREARM   Final   Special Requests BOTTLES DRAWN AEROBIC ONLY [facility]   Final   Culture  Setup Time      Final   Value: 01/08/2014 10:12     Performed at [facility]   Culture     Final   Value:        BLOOD CULTURE RECEIVED NO GROWTH TO DATE CULTURE WILL BE HELD FOR 5 DAYS BEFORE ISSUING A FINAL NEGATIVE REPORT     Performed at [facility]   Report Status PENDING   Incomplete   Studies/Results: Ct Abdomen Pelvis Wo Contrast  01/11/2014   CLINICAL DATA:  Sepsis.  Fever.  EXAM: CT ABDOMEN AND PELVIS WITHOUT CONTRAST  TECHNIQUE: Multidetector CT imaging of the abdomen and pelvis was performed following  the standard protocol without IV contrast.  COMPARISON:  01/04/2014  FINDINGS: Increasing dependent bibasilar opacities, likely atelectasis. Heart is normal size. Trace effusions present.  Subtle nodular contours of the liver are again noted suggesting cirrhosis. No visible focal abnormality on this unenhanced study. Spleen, pancreas, kidneys have an unremarkable unenhanced appearance. Right adrenal unremarkable. Mild diffuse enlargement of the left renal gland compatible with hyperplasia.  Small amount of free fluid in the cul-de-sac. Uterus, adnexae and urinary bladder are unremarkable. Stomach, large and small bowel are grossly unremarkable. Appendix is visualized and is normal. No free air or adenopathy. Aorta is normal caliber.  No acute bony abnormality or focal bone lesion.  IMPRESSION: Increasing bibasilar densities, likely atelectasis. Suspect trace effusions.  Mildly nodular contours of the liver again noted suggesting cirrhosis.  Trace free fluid in the pelvis.  No acute findings.   Electronically Signed   By: Charlett Nose M.D.   On: 01/11/2014 00:05   Mr Thoracic Spine Wo Contrast  01/11/2014   CLINICAL DATA:  Evaluate fever of unknown origin.  EXAM: MRI THORACIC AND LUMBAR SPINE WITHOUT CONTRAST  TECHNIQUE: Multiplanar and multiecho pulse sequences of the thoracic and lumbar spine were obtained without intravenous contrast.  COMPARISON:  CT of the abdomen and pelvis January 14, 2014  FINDINGS: MR THORACIC SPINE FINDINGS  Thoracic vertebral bodies and posterior elements are intact and aligned with maintenance of thoracic kyphosis. Sub cm bright T1 and bright T2 hemangioma T7. No abnormal STIR signal. Intervertebral discs demonstrate normal morphology and signal characteristics.  The ventral thoracic spinal cord deformity due to disc protrusion as described below, the spinal cord is overall normal in morphology and signal characteristics the conus medullaris which terminates at T12-L1. No abnormal epidural fluid collections. Partially imaged small bilateral pleural effusions. Mild subcutaneous dependent edema within the lower thoracic spine.  Minimal annular bulging at C6-7. At T7-8 is moderate left central disc protrusion with possible annular fissure resulting in mild to moderate canal stenosis, left ventral spinal cord deformity. Possible small left subarticular to extra foraminal T8-9 disc protrusion resulting in moderate left T8-9 neural foraminal narrowing.  MR LUMBAR SPINE FINDINGS  Lumbar vertebral bodies and posterior elements are intact and aligned and maintenance of lumbar lordosis. Intervertebral discs demonstrate normal morphology and signal characteristics. No abnormal bone marrow signal, specifically no bright STIR signal to suggest acute osseous process.  Conus medullaris terminates at T12-L1 and appears normal in morphology and signal characteristics. 1 mm bright T1 signal along the filum terminale may reflect fibrolipomatous changes without cord tethering. Mild paraspinal muscle atrophy. Dependent subcutaneous edema and grade 1 paraspinal muscle strain.  Level by level evaluation:  T12-L1, L1-2, L2-3, L3-4 and L4-5: No disc bulge. Mild facet arthropathy without canal stenosis or neural foraminal narrowing.  L5-S1: No disc bulge. Moderate facet arthropathy. No canal stenosis. Mild neural foraminal narrowing.  IMPRESSION: MR THORACIC SPINE IMPRESSION  No findings of discitis,  osteomyelitis nor epidural abscess on this nonenhanced examination.  Thoracic spondylosis, including T7-8 moderate left central disc protrusion resulting in mild to moderate canal stenosis. Possible left subarticular and T8-9 small disc protrusion resulting in moderate left T8-9 neural foraminal narrowing.  MR LUMBAR SPINE IMPRESSION  No findings of discitis, osteomyelitis nor epidural abscess on this nonenhanced examination.  Grade 1 paraspinal muscle strain.  Facet arthropathy without neurocompressive changes.   Electronically Signed   By: Awilda Metro   On: 01/11/2014 00:13   Mr Lumbar Spine Wo Contrast  01/11/2014  CLINICAL DATA:  Evaluate fever of unknown origin.  EXAM: MRI THORACIC AND LUMBAR SPINE WITHOUT CONTRAST  TECHNIQUE: Multiplanar and multiecho pulse sequences of the thoracic and lumbar spine were obtained without intravenous contrast.  COMPARISON:  CT of the abdomen and pelvis January 14, 2014  FINDINGS: MR THORACIC SPINE FINDINGS  Thoracic vertebral bodies and posterior elements are intact and aligned with maintenance of thoracic kyphosis. Sub cm bright T1 and bright T2 hemangioma T7. No abnormal STIR signal. Intervertebral discs demonstrate normal morphology and signal characteristics.  The ventral thoracic spinal cord deformity due to disc protrusion as described below, the spinal cord is overall normal in morphology and signal characteristics the conus medullaris which terminates at T12-L1. No abnormal epidural fluid collections. Partially imaged small bilateral pleural effusions. Mild subcutaneous dependent edema within the lower thoracic spine.  Minimal annular bulging at C6-7. At T7-8 is moderate left central disc protrusion with possible annular fissure resulting in mild to moderate canal stenosis, left ventral spinal cord deformity. Possible small left subarticular to extra foraminal T8-9 disc protrusion resulting in moderate left T8-9 neural foraminal narrowing.  MR LUMBAR SPINE  FINDINGS  Lumbar vertebral bodies and posterior elements are intact and aligned and maintenance of lumbar lordosis. Intervertebral discs demonstrate normal morphology and signal characteristics. No abnormal bone marrow signal, specifically no bright STIR signal to suggest acute osseous process.  Conus medullaris terminates at T12-L1 and appears normal in morphology and signal characteristics. 1 mm bright T1 signal along the filum terminale may reflect fibrolipomatous changes without cord tethering. Mild paraspinal muscle atrophy. Dependent subcutaneous edema and grade 1 paraspinal muscle strain.  Level by level evaluation:  T12-L1, L1-2, L2-3, L3-4 and L4-5: No disc bulge. Mild facet arthropathy without canal stenosis or neural foraminal narrowing.  L5-S1: No disc bulge. Moderate facet arthropathy. No canal stenosis. Mild neural foraminal narrowing.  IMPRESSION: MR THORACIC SPINE IMPRESSION  No findings of discitis, osteomyelitis nor epidural abscess on this nonenhanced examination.  Thoracic spondylosis, including T7-8 moderate left central disc protrusion resulting in mild to moderate canal stenosis. Possible left subarticular and T8-9 small disc protrusion resulting in moderate left T8-9 neural foraminal narrowing.  MR LUMBAR SPINE IMPRESSION  No findings of discitis, osteomyelitis nor epidural abscess on this nonenhanced examination.  Grade 1 paraspinal muscle strain.  Facet arthropathy without neurocompressive changes.   Electronically Signed   By: Awilda Metro   On: 01/11/2014 00:13   Medications: I have reviewed the patient's current medications. Scheduled Meds: . sodium chloride   Intravenous Once  . abacavir  300 mg Oral BID  . acyclovir  800 mg Oral Daily  . aspirin EC  81 mg Oral Daily  .  ceFAZolin (ANCEF) IV  2 g Intravenous Q12H  . cloNIDine  0.3 mg Oral TID  . darbepoetin (ARANESP) injection - NON-DIALYSIS  40 mcg Subcutaneous Q Sat-1800  . Darunavir Ethanolate  800 mg Oral Q breakfast   . enoxaparin (LOVENOX) injection  30 mg Subcutaneous Q24H  . hydrALAZINE  50 mg Oral 3 times per day  . hydrocerin   Topical BID  . insulin aspart  0-9 Units Subcutaneous TID WC  . insulin glargine  20 Units Subcutaneous QHS  . labetalol  600 mg Oral BID  . lacosamide  150 mg Oral BID  . lactulose  20 g Oral TID  . lamiVUDine  50 mg Oral Daily  . levETIRAcetam  500 mg Oral BID  . magic mouthwash w/lidocaine  5 mL Oral TID  .  ritonavir  100 mg Oral Q breakfast  . sodium bicarbonate  650 mg Oral BID  . sodium chloride  3 mL Intravenous Q12H   Continuous Infusions:  PRN Meds:.acetaminophen, hydrOXYzine, menthol-cetylpyridinium, ondansetron (ZOFRAN) IV, ondansetron Assessment/Plan: 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 Her mental status is unchanged. She had fever with Tmax 102.14F on 8/9 with BC at that time that grew MSSA in 2/2 vials. She received vancomycin for 2 days and has been on cefazolin for 4 days.The source of her bacteremia was likely her port-a-cath placed on 12/05/13 which was removed on 8/11. However, she continued to spike fevers until the eve of 8/13. SBP less likely given minimum ascites, no abdominal pain. No signs of endocarditis on exam with no new murmur. TEE with no endocarditis. MRI lumbar/thoracic spine negative for epidural abscess. CT abd/pelvis negative for intraabdominal process,  -Continue cefazolin, will need central PICC line once afebrile for 48 hrs--for tx 4 weeks total (will need central PICC by IR) -Continue monitoring for s/s of SBP  -[facility] if she spikes a fever overnight.   Acute on chronic Anemia: Baseline Hg  of 6-8. Dropped to 6.8 on 8/14. She received 1 unit of pRBC but her post-transfusion CBC was 6.6. She had bleeding around her peripheral IV overnight but denies hemoptysis, hematuria, hematochezia, or melena. Hg of 7 today.  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  -GI performed EGD and colonoscopy on 7/23 for +FOBT: nodular gastric mucosa and normal colon, terminal ileum which yielded benign pathology of the colon, ileum, and duodenum with minimal chronic inflammation. -She has been evaluated in the inpatient setting by Dr. Cyndie ChimeGranfortuna in Hematology/Concology who recommends pRBC transfusion only if she is symptomatic.  -She had bone marrow bx on 7/2 inconclusive and non-diagnostic. She is on ART which can cause suppression of all cell lines.  -She has received Aranesp injection recently per Dr. Sibyl ParrGoldsbourouhg in Nephrology, and seemed to have maintained her Hg stable for a while.  -Retic count unremarkable, LDH elevated but in setting of large ecchymosis in her left upper chest and arm. Plan:  -Aranesp 40mcg weekly (received on Saturday) per Dr. Arlean HoppingSchertz in Nephrology  -CBC in am  -FOTB   Acute on Chronic Renal Failure : Cr still elevated, stable at 3.19 today.  Baseline Cr in the 2.5-2.8 range. This is likely multifactorial from furosemide use, ACEi use, and possibly Vancomycin use.  -Strict in and outs  -Hold Lasix and ACEi  -CMP in am  -Nephrology consulted, appreciate recommendations   Hepatic Encephalopathy: Resolved. She arrived lethargic which was concerning for acute hepatic encephalopathy. Ammonia level on admission was 127 and increased to 161, but she is back to her baseline mental status today.  -Continue home lactulose PO PRN titrated to 3BM/day  -Diet heart healthy/ Carb mod only if fully alert   Ascites: Soft and obese abdomen, only mildly  distended and not tense. CT abdomen/pelvis on presentation with only mild ascities, no acute intraabdominal  process. She had mild RUQ pain but [place] Abd was unremarkable for acute gall bladder disease. CT/Abd pelvis with no acute intraabdominal process. Not concerned for SBP at this time. Bilirubin trending down at 3.5 today.  -Will consider diagnostic/therapeutic tap of ascitic fluid if she declines  -CMP in am   History of seizure: She was Dilantin at home which can be sedating. This may have precipitated her somnolence, as her corrected level was elevated at 25 on presentation. Dilantin has been discontinued. Had pseudoseizures on eve of 8/12 and 8/13 with voluntary jerking movements, following commands, with no confusion.  -Continue vimpat  -Continue Keppra   Hypokalemia: resolved. K is 3.8 today. Will continue monitoring and provide gentle repletion given her CKD4 if K is at or <3.0.   Hepatitis C: Genotype in December 2014. Genotype 1b, viral load of [id number], Log of 6.63.  -She will f/u with Dr. Drue Second in ID for possible treatment of Hep C   HIV disease: Well controlled with viral load undectable in 09/08/13. CD4 count in April at 280. Will recheck labs as dilantin level has been super therapeutic and can interact with antiretrovirals  -Repeat CD4  -Recheck viral load  -Continue home abacavir, prezista, norvir, and epivir  -Continue acyclovir for prophylaxis   HTN: Normal to low. She also has had ANA positive with titers that increased from 1:40 to 1:80 after use of hydralazine. After discussing these findings with Dr. Arlean Hopping in Nephrology will discontinue hydralazine for now and recheck ANA and titers in 1 month with her PCP follow up. She may need Norvasc if BP trends up again.  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 Secondary to Hepatitis C infection. 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 Platelets and INR stable.   DMII: Last Hg A1C 7.5% on 11/13/13. On Lantus 30 u qHS and Novolog 10-25 u actid at home.  -Lantus 20 units qHS  -Sensitive SSI   DVT prophylaxis: SCDs   Diet:  HH, Carb mod   Dispo: Disposition is deferred at this time, awaiting improvement of current medical problems. Anticipated discharge in approximately 2-3 day(s). She needs SNF placement while on Abx tx for 4 weeks as her sister will not be able to manage the central PICC line and Abx.   The patient does have a current PCP Christen Bame, MD) and does need an [facility] hospital follow-up appointment after discharge.   The patient does have transportation limitations that hinder transportation to clinic appointments.    .Services Needed at time of discharge: Y = Yes, Blank = No PT:   OT:   RN:   Equipment:   Other:     LOS: 9 days   Ky Barban, MD 01/12/2014, 8:12 AM

## 2014-01-12 NOTE — Progress Notes (Addendum)
Internal Medicine On-Call Attending  Date: 01/12/2014  Patient name: Michelle [name] Medical record number: [id number] Date of birth: [birth year] Age: 53 y.o. Gender: female  I saw and evaluated the patient. I discussed patient and reviewed the resident's note by Dr. Garald Braver, and I agree with the resident's findings and plans as documented in her note.  Dr. Rogelia Boga will take over as attending physician tomorrow 01/13/2014.

## 2014-01-12 NOTE — Clinical Social Work Placement (Signed)
Clinical Social Work Department CLINICAL SOCIAL WORK PLACEMENT NOTE 01/12/2014  Patient:  Michelle [name], Michelle [name]  Account Number:  [id number] Admit date:  01/03/2014  Clinical Social Worker:  Vivi Barrack, LCSWA  Date/time:  01/12/2014 07:32 AM  Clinical Social Work is seeking post-discharge placement for this patient at the following level of care:   SKILLED NURSING   (*CSW will update this form in Epic as items are completed)   01/10/2014  Patient/family provided with [facility] Department of Clinical Social Work's list of facilities offering this level of care within the geographic area requested by the patient (or if unable, by the patient's family).  01/10/2014  Patient/family informed of their freedom to choose among providers that offer the needed level of care, that participate in Medicare, Medicaid or managed care program needed by the patient, have an available bed and are willing to accept the patient.  01/10/2014  Patient/family informed of [facility]' ownership interest in [facility], as well as of the fact that they are under no obligation to receive care at this facility.  PASARR submitted to EDS on Existing PASARR number received on Existing  FL2 transmitted to all facilities in geographic area requested by pt/family on  01/12/2014 FL2 transmitted to all facilities within larger geographic area on   Patient informed that his/her managed care company has contracts with or will negotiate with  certain facilities, including the following:     Patient/family informed of bed offers received:   Patient chooses bed at  Physician recommends and patient chooses bed at    Patient to be transferred to  on   Patient to be transferred to facility by  Patient and family notified of transfer on  Name of family member notified:    The following physician request were entered in Epic:   Additional Comments:  Oluwafemi Villella Patrick-Jefferson, LCSWA Weekend Clinical  Social Worker [phone]

## 2014-01-12 NOTE — Progress Notes (Signed)
Pt had elevated temps this shift 8/15 @ 2053 temp of 100.33F orally, 8/16 @ 0504 temp of 100.2F orally. Pt has no complaints with temperature. On call MD made aware. No new orders at this time. Will continue to monitor pt.

## 2014-01-12 NOTE — Progress Notes (Signed)
Patient complained of pain "across the chest." when asked to describe the pain, pt states "It just hurts." MD texted paged. Orders to given PRN tylenol. Will reassess.   11:30 AM patient asleep comfortably in chair. No further complaints of pain

## 2014-01-12 NOTE — Progress Notes (Signed)
  I have seen and examined the patient, and reviewed the daily progress note by Lillia Carmel, MS 4 and discussed the care of the patient with them. Please see my progress note from 01/12/2014 for further details regarding assessment and plan.    Signed:  Ky Barban, MD 01/12/2014, 11:19 AM

## 2014-01-12 NOTE — Progress Notes (Signed)
ANTIBIOTIC CONSULT NOTE - FOLLOW UP  Pharmacy Consult for Cefazolin Indication: MSSA bacteremia  Allergies  Allergen Reactions  . 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 Morphine And Related Hives, Itching and Rash  . Norvasc [Amlodipine Besylate] Hives, Itching and Rash    Patient Measurements: Height: 5\' 5"  (165.1 cm) Weight: 185 lb 3 oz (84 kg) IBW/kg (Calculated) : 57  Vital Signs: Temp: 99.2 F (37.3 C) (08/16 0835) Temp src: Oral (08/16 0835) BP: 135/71 mmHg (08/16 0504) Pulse Rate: 79 (08/16 0504) Intake/Output from previous day: 08/15 0701 - 08/16 0700 In: 1055 [P.O.:1042; I.V.:3] Out: 950 [Urine:950] Intake/Output from this shift: Total I/O In: -  Out: 400 [Urine:400]  Labs:  Recent Labs  01/10/14 0540 01/11/14 0047 01/11/14 0615 01/11/14 1627 01/12/14 0643 01/12/14 0850  WBC 7.7 6.7  --   --   --  7.6  HGB 6.8* 6.6*  --   --   --  7.0*  PLT 138* 155  --   --   --  202  LABCREA  --   --   --  107.14  --   --   CREATININE 3.08*  --  3.28*  --  3.19*  --    Estimated Creatinine Clearance: 21.6 ml/min (by C-G formula based on Cr of 3.19). No results found for this basename: Rolm Gala, VANCORANDOM, GENTTROUGH, GENTPEAK, GENTRANDOM, TOBRATROUGH, TOBRAPEAK, TOBRARND, AMIKACINPEAK, AMIKACINTROU, AMIKACIN,  in the last 72 hours   Microbiology: Recent Results (from the past 720 hour(s))  URINE CULTURE     Status: None   Collection Time    12/17/13  9:55 AM      Result Value Ref Range Status   Specimen Description URINE, CATHETERIZED   Final   Special Requests NONE   Final   Culture  Setup Time     Final   Value: 12/17/2013 13:41     Performed at [facility] Count     Final   Value: NO GROWTH     Performed at [facility]   Culture     Final   Value: NO GROWTH     Performed at [facility]   Report Status 12/18/2013 FINAL   Final   CLOSTRIDIUM DIFFICILE BY PCR     Status: None   Collection Time    12/17/13  7:58 PM      Result Value Ref Range Status   C difficile by pcr NEGATIVE  NEGATIVE Final  URINE CULTURE     Status: None   Collection Time    12/20/13  6:24 PM      Result Value Ref Range Status   Specimen Description URINE, CLEAN CATCH   Final   Special Requests NONE   Final   Culture  Setup Time     Final   Value: 12/21/2013 01:19     Performed at [facility] Count     Final   Value: >=100,000 COLONIES/ML     Performed at [facility]   Culture     Final   Value: Multiple bacterial morphotypes present, none predominant. Suggest appropriate recollection if clinically indicated.     Performed at [facility]   Report Status 12/22/2013 FINAL   Final  MRSA PCR SCREENING     Status: Abnormal   Collection Time    01/04/14  4:35 AM  Result Value Ref Range Status   MRSA by PCR POSITIVE (*) NEGATIVE Final   Comment:            The GeneXpert MRSA Assay (FDA     approved for NASAL specimens     only), is one component of a     comprehensive MRSA colonization     surveillance program. It is not     intended to diagnose MRSA     infection nor to guide or     monitor treatment for     MRSA infections.     RESULT CALLED TO, READ BACK BY AND VERIFIED WITH:     R.ZELLNER,RN [id number] 01/04/14 M.CAMPBELL  CULTURE, BLOOD (ROUTINE X 2)     Status: None   Collection Time    01/05/14  3:30 PM      Result Value Ref Range Status   Specimen Description BLOOD LEFT ARM   Final   Special Requests     Final   Value: BOTTLES DRAWN AEROBIC AND ANAEROBIC 10CC BLUE, 5CC RED   Culture  Setup Time     Final   Value: 01/05/2014 22:48     Performed at [facility]   Culture     Final   Value: STAPHYLOCOCCUS AUREUS     Note: RIFAMPIN AND GENTAMICIN SHOULD NOT BE USED AS SINGLE DRUGS FOR TREATMENT OF STAPH INFECTIONS.     Note: Gram Stain Report Called to,Read Back By and Verified  With: Volanda Napoleon RN on 01/06/14 at 06:45 by Christie Nottingham     Performed at [facility]   Report Status 01/08/2014 FINAL   Final   Organism ID, Bacteria STAPHYLOCOCCUS AUREUS   Final  CULTURE, BLOOD (ROUTINE X 2)     Status: None   Collection Time    01/05/14  3:42 PM      Result Value Ref Range Status   Specimen Description BLOOD BLOOD LEFT FOREARM   Final   Special Requests BOTTLES DRAWN AEROBIC ONLY 5CC   Final   Culture  Setup Time     Final   Value: 01/05/2014 22:48     Performed at [facility]   Culture     Final   Value: STAPHYLOCOCCUS AUREUS     Note: SUSCEPTIBILITIES PERFORMED ON PREVIOUS CULTURE WITHIN THE LAST 5 DAYS.     Note: Gram Stain Report Called to,Read Back By and Verified With: Volanda Napoleon RN on 01/06/14 at 06:45 by Christie Nottingham     Performed at [facility]   Report Status 01/08/2014 FINAL   Final  CULTURE, BLOOD (ROUTINE X 2)     Status: None   Collection Time    01/06/14  3:35 PM      Result Value Ref Range Status   Specimen Description BLOOD LEFT ANTECUBITAL   Final   Special Requests     Final   Value: BOTTLES DRAWN AEROBIC AND ANAEROBIC 10CC AER 5CC ANA   Culture  Setup Time     Final   Value: 01/06/2014 19:02     Performed at [facility]   Culture     Final   Value: STAPHYLOCOCCUS AUREUS     Note: RIFAMPIN AND GENTAMICIN SHOULD NOT BE USED AS SINGLE DRUGS FOR TREATMENT OF STAPH INFECTIONS.     Note: Gram Stain Report Called to,Read Back By and Verified With: Junius Argyle 01/08/14 AT 0100 RIDK     Performed at [facility]  Report Status 01/10/2014 FINAL   Final   Organism ID, Bacteria STAPHYLOCOCCUS AUREUS   Final  CULTURE, BLOOD (ROUTINE X 2)     Status: None   Collection Time    01/06/14  3:45 PM      Result Value Ref Range Status   Specimen Description BLOOD LEFT HAND   Final   Special Requests     Final   Value: BOTTLES DRAWN AEROBIC AND ANAEROBIC 10CC AER 5CC ANA   Culture  Setup Time     Final    Value: 01/06/2014 19:00     Performed at [facility]   Culture     Final   Value:        BLOOD CULTURE RECEIVED NO GROWTH TO DATE CULTURE WILL BE HELD FOR 5 DAYS BEFORE ISSUING A FINAL NEGATIVE REPORT     Performed at [facility]   Report Status PENDING   Incomplete  CULTURE, BLOOD (ROUTINE X 2)     Status: None   Collection Time    01/08/14  6:56 AM      Result Value Ref Range Status   Specimen Description BLOOD LEFT HAND   Final   Special Requests BOTTLES DRAWN AEROBIC AND ANAEROBIC 10CC   Final   Culture  Setup Time     Final   Value: 01/08/2014 10:12     Performed at [facility]   Culture     Final   Value:        BLOOD CULTURE RECEIVED NO GROWTH TO DATE CULTURE WILL BE HELD FOR 5 DAYS BEFORE ISSUING A FINAL NEGATIVE REPORT     Performed at [facility]   Report Status PENDING   Incomplete  CULTURE, BLOOD (ROUTINE X 2)     Status: None   Collection Time    01/08/14  7:05 AM      Result Value Ref Range Status   Specimen Description BLOOD BLOOD LEFT FOREARM   Final   Special Requests BOTTLES DRAWN AEROBIC ONLY 7CC   Final   Culture  Setup Time     Final   Value: 01/08/2014 10:12     Performed at [facility]   Culture     Final   Value:        BLOOD CULTURE RECEIVED NO GROWTH TO DATE CULTURE WILL BE HELD FOR 5 DAYS BEFORE ISSUING A FINAL NEGATIVE REPORT     Performed at [facility]   Report Status PENDING   Incomplete    Anti-infectives   Start     Dose/Rate Route Frequency Ordered Stop   01/09/14 0630  vancomycin (VANCOCIN) IVPB 750 mg/150 ml premix  Status:  Discontinued     750 mg 150 mL/hr over 60 Minutes Intravenous Every 48 hours 01/07/14 1418 01/08/14 1433   01/08/14 1000  lamiVUDine (EPIVIR) 10 MG/ML solution 50 mg     50 mg Oral Daily 01/07/14 1517     01/07/14 0600  vancomycin (VANCOCIN) IVPB 750 mg/150 ml premix  Status:  Discontinued     750 mg 150 mL/hr over 60 Minutes Intravenous Every 24 hours 01/06/14  0701 01/07/14 1418   01/06/14 1600  ceFAZolin (ANCEF) IVPB 2 g/50 mL premix     2 g 100 mL/hr over 30 Minutes Intravenous Every 12 hours 01/06/14 1518     01/06/14 1500  lamiVUDine (EPIVIR) 10 MG/ML solution 100 mg  Status:  Discontinued     100 mg Oral Daily  01/06/14 1402 01/07/14 1517   01/06/14 0800  vancomycin (VANCOCIN) 1,250 mg in sodium chloride 0.9 % 250 mL IVPB     1,250 mg 166.7 mL/hr over 90 Minutes Intravenous  Once 01/06/14 0701 01/06/14 1156   01/04/14 1000  lamiVUDine (EPIVIR) tablet 150 mg  Status:  Discontinued     150 mg Oral Daily 01/04/14 0414 01/06/14 1401   01/04/14 1000  abacavir (ZIAGEN) tablet 300 mg     300 mg Oral 2 times daily 01/04/14 0414     01/04/14 1000  acyclovir (ZOVIRAX) tablet 800 mg     800 mg Oral Daily 01/04/14 0414     01/04/14 0800  Darunavir Ethanolate (PREZISTA) tablet 800 mg     800 mg Oral Daily with breakfast 01/04/14 0414     01/04/14 0800  ritonavir (NORVIR) capsule 100 mg     100 mg Oral Daily with breakfast 01/04/14 0414        Assessment: 53 yo f admitted on 8/14 for lethargy.  On day 8 of cefazolin 2gm q12h for MSSA bacteremia. S/p TEE negative for endocarditis  Wbc wnl, tmax 100.3, SCr 3.19  Vancomycin 8/10 >> 8/12 Cefazolin 8/10 >>  8/9 Bld x2: MSSA (2/2) 8/10 Bld x2: MSSA (1/2) 8/12 Bld x2: NGTD 8/14 Bld x2: sent  Goal of Therapy:  Eradication of infection Appropriate cefazolin dosing  Plan:  Continue cefazolin 2gm IV q12hr Monitor renal function, fevers, repeat blood cultures  Michelle [name], PharmD Clinical Pharmacy Resident Pager: [phone] 01/12/2014 10:34 AM

## 2014-01-12 NOTE — Progress Notes (Signed)
[facility] Progress Note   Subjective: Doing better, up in chair, conversant. 950 cc uop yesterday.   Filed Vitals:   01/11/14 2314 01/12/14 0504 01/12/14 0835 01/12/14 1057  BP: 155/77 135/71  111/62  Pulse: 100 79  60  Temp:  100.1 F (37.8 C) 99.2 F (37.3 C)   TempSrc:  Oral Oral   Resp:  18    Height:      Weight:  84 kg (185 lb 3 oz)    SpO2:  99%     Exam: Alert, no distress No jvd Chest clear bilat RRR no MRG Abd obese, soft, NTND Trace - 1+ pretib edema bilat, mild thigh edema bilat Neuro is nf, Ox 3  Date   Creat   eGFR   CKD  2014   1.6 - 2.3  23- 35   IIIb- IV  Jan '15  1.75- 2.1 2 5- 32  Mar '15  2.06 26  May '15  1.75- 2.33  23- 32  July '15  2.10- 2.74  19- 25   IV  UNa <20 UCr 107 ECHO 55% EF  01/10/14 UA >300 prot, +yeast, 7-10 wbc, few bact, gran casts CXR no acute CT abd/pelvis - increasing bibasilar densities, likely atx, suspect trace effusion, trace free fluid in pelvis, nodular liver again suggesting cirrhosis  Renal US - 10.9 and 10.1 cm kidneys, echogenic kidneys, no hydro   Assessment: 1 Acute on CRF - suspect this is due to BP lowering with prob poorly controlled BP chronically. I am going to reduce BP meds by 50% , let BP come up a bit.  Low UNa could be from HRS but she doesn't fit the clinical picture so will not pursue this now.  UOP is good and creat down a bit today. 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  Will reduce BP meds as above today, watch creat. No other  suggestions.     Kelly Splinter MD  pager [phone]    cell [phone]  01/12/2014, 11:54 AM     Recent Labs Lab 01/08/14 2340  01/10/14 0540 01/11/14 0615 01/12/14 0643  NA 136*  < > 139 134* 136*  K 3.5*  < > 3.6* 3.8 3.8  CL 103  < > 108 103 107  CO2 16*  < > 16* 14* 13*  GLUCOSE 256*  < > 246* 226* 221*  BUN 49*  < > 55* 58* 55*  CREATININE 2.77*  < > 3.08* 3.28* 3.19*  CALCIUM 7.2*  < > 7.4* 7.7* 7.8*  PHOS 3.2  --   --   --   --   < > = values in this interval not displayed.  Recent Labs Lab 01/10/14 0540 01/11/14 0615 01/12/14 0643  AST 67* 58* 58*  ALT 8 <5 <5  ALKPHOS 499* 544* 574*  BILITOT 4.0* 3.5* 3.6*  PROT 5.2* 5.5* 5.3*  ALBUMIN 1.4* 1.4* 1.3*    Recent Labs Lab 01/08/14 0656  01/09/14 1015 01/10/14  0540 01/11/14 0047 01/12/14 0850  WBC 9.4  < > 8.6 7.7 6.7 7.6  NEUTROABS 7.3  --  6.8 5.6  --   --   HGB 8.0*  < > 7.0* 6.8* 6.6* 7.0*  HCT 23.9*  < > 20.9* 20.4* 20.1* 21.3*  MCV 108.1*  < > 105.0* 107.9* 107.5* 108.7*  PLT 140*  < > 125* 138* 155 202  < > = values in this interval not displayed. . sodium chloride   Intravenous Once  . abacavir  300 mg Oral BID  . acyclovir  800 mg Oral Daily  . aspirin EC  81 mg Oral Daily  .  ceFAZolin (ANCEF) IV  2 g Intravenous Q12H  . cloNIDine  0.2 mg Oral BID  . darbepoetin (ARANESP) injection - NON-DIALYSIS  40 mcg Subcutaneous Q Sat-1800  . Darunavir Ethanolate  800 mg Oral Q breakfast  . enoxaparin (LOVENOX) injection  30 mg Subcutaneous Q24H  . hydrALAZINE  25 mg Oral 3 times per day  . hydrocerin   Topical BID  . insulin aspart  0-9 Units Subcutaneous TID WC  . insulin glargine  20 Units Subcutaneous QHS  . labetalol  300 mg Oral BID  . lacosamide  150 mg Oral BID  . lactulose  20 g Oral TID  . lamiVUDine  50 mg Oral Daily  . levETIRAcetam  500 mg Oral BID  . magic mouthwash w/lidocaine  5 mL Oral TID  . pantoprazole  20 mg Oral Once  . ritonavir  100 mg Oral Q breakfast  . sodium  bicarbonate  650 mg Oral BID  . sodium chloride  3 mL Intravenous Q12H     acetaminophen, hydrOXYzine, menthol-cetylpyridinium, ondansetron (ZOFRAN) IV, ondansetron

## 2014-01-12 NOTE — Progress Notes (Signed)
Subjective: Michelle [name] had no acute events overnight. She has been afebrile, is voiding appropriately, and has had no reports of seizures/pseudoseizures per nursing. No active bleeding or reports of melena or hematochezia.  Objective: Vital signs in last 24 hours: Filed Vitals:   01/11/14 2053 01/11/14 2314 01/12/14 0504 01/12/14 0835  BP: 160/72 155/77 135/71   Pulse: 79 100 79   Temp: 100.3 F (37.9 C)  100.1 F (37.8 C) 99.2 F (37.3 C)  TempSrc: Oral  Oral Oral  Resp: 18  18   Height:      Weight:   84 kg (185 lb 3 oz)   SpO2: 99%  99%    Weight change:   Intake/Output Summary (Last 24 hours) at 01/12/14 0908 Last data filed at 01/12/14 8127  Gross per 24 hour  Intake    833 ml  Output    600 ml  Net    233 ml   Gen: Laying in bed in NAD HEENT: no scleral icterus, MMM CV: RRR, no murmurs, rubs, or gallops, normal S1 and S2 Resp: bibasilar crackles bilaterally, normal respiratory effort, no wheezing  Abd: soft, mildly distended, non tender, no pain on palpation, bowel sounds present, no caput medusa.  Ext: no peripheral edema, no teleangiectasias. Splinter hemorrhage in left 5th digit nailbed, and two PIVs placed on left hand and arm. Neuro: CN II-XII grossly intact  Lab Results: Basic Metabolic Panel:  Recent Labs Lab 01/08/14 2340  01/11/14 0615 01/12/14 0643  NA 136*  < > 134* 136*  K 3.5*  < > 3.8 3.8  CL 103  < > 103 107  CO2 16*  < > 14* 13*  GLUCOSE 256*  < > 226* 221*  BUN 49*  < > 58* 55*  CREATININE 2.77*  < > 3.28* 3.19*  CALCIUM 7.2*  < > 7.7* 7.8*  MG 1.7  --   --   --   PHOS 3.2  --   --   --   < > = values in this interval not displayed. Liver Function Tests:  Recent Labs Lab 01/11/14 0615 01/12/14 0643  AST 58* 58*  ALT <5 <5  ALKPHOS 544* 574*  BILITOT 3.5* 3.6*  PROT 5.5* 5.3*  ALBUMIN 1.4* 1.3*   CBC:  Recent Labs Lab 01/09/14 1015 01/10/14 0540 01/11/14 0047  WBC 8.6 7.7 6.7  NEUTROABS 6.8 5.6  --   HGB 7.0* 6.8* 6.6*    HCT 20.9* 20.4* 20.1*  MCV 105.0* 107.9* 107.5*  PLT 125* 138* 155   Cardiac Enzymes:  Recent Labs Lab 01/08/14 2340 01/09/14 0612 01/09/14 1153  CKTOTAL  --  59  --   TROPONINI 0.34* <0.30 <0.30   CBG:  Recent Labs Lab 01/10/14 1652 01/10/14 2149 01/11/14 0746 01/11/14 1219 01/11/14 1640 01/11/14 2138  GLUCAP 231* 264* 232* 290* 274* 131*   Coagulation:  Recent Labs Lab 01/08/14 0925  LABPROT 12.0  INR 0.89   Anemia Panel:  Recent Labs Lab 01/11/14 1057  RETICCTPCT 1.2   Urine Drug Screen: Drugs of Abuse     Component Value Date/Time   LABOPIA NONE DETECTED 01/03/2014 2311   LABOPIA NEG 11/13/2013 1516   COCAINSCRNUR NONE DETECTED 01/03/2014 2311   COCAINSCRNUR NEG 11/13/2013 1516   LABBENZ NONE DETECTED 01/03/2014 2311   LABBENZ NEG 11/13/2013 1516   AMPHETMU NONE DETECTED 01/03/2014 2311   THCU NONE DETECTED 01/03/2014 2311   LABBARB NONE DETECTED 01/03/2014 2311   LABBARB NEG 11/13/2013 1516  Urinalysis:  Recent Labs Lab 01/06/14 1141 01/11/14 1627  COLORURINE ORANGE* AMBER*  LABSPEC 1.021 1.019  PHURINE 5.5 5.5  GLUCOSEU 100* 100*  HGBUR NEGATIVE NEGATIVE  BILIRUBINUR LARGE* LARGE*  KETONESUR NEGATIVE 15*  PROTEINUR >300* >300*  UROBILINOGEN 1.0 0.2  NITRITE NEGATIVE NEGATIVE  LEUKOCYTESUR TRACE* SMALL*    Micro Results: Recent Results (from the past 240 hour(s))  MRSA PCR SCREENING     Status: Abnormal   Collection Time    01/04/14  4:35 AM      Result Value Ref Range Status   MRSA by PCR POSITIVE (*) NEGATIVE Final   Comment:            The GeneXpert MRSA Assay (FDA     approved for NASAL specimens     only), is one component of a     comprehensive MRSA colonization     surveillance program. It is not     intended to diagnose MRSA     infection nor to guide or     monitor treatment for     MRSA infections.     RESULT CALLED TO, READ BACK BY AND VERIFIED WITH:     R.ZELLNER,RN [id number] 01/04/14 M.CAMPBELL  CULTURE, BLOOD (ROUTINE X  2)     Status: None   Collection Time    01/05/14  3:30 PM      Result Value Ref Range Status   Specimen Description BLOOD LEFT ARM   Final   Special Requests     Final   Value: BOTTLES DRAWN AEROBIC AND ANAEROBIC 10CC BLUE, 5CC RED   Culture  Setup Time     Final   Value: 01/05/2014 22:48     Performed at [facility]   Culture     Final   Value: STAPHYLOCOCCUS AUREUS     Note: RIFAMPIN AND GENTAMICIN SHOULD NOT BE USED AS SINGLE DRUGS FOR TREATMENT OF STAPH INFECTIONS.     Note: Gram Stain Report Called to,Read Back By and Verified With: Ventura Bruns RN on 01/06/14 at 06:45 by Rise Mu     Performed at [facility]   Report Status 01/08/2014 FINAL   Final   Organism ID, Bacteria STAPHYLOCOCCUS AUREUS   Final  CULTURE, BLOOD (ROUTINE X 2)     Status: None   Collection Time    01/05/14  3:42 PM      Result Value Ref Range Status   Specimen Description BLOOD BLOOD LEFT FOREARM   Final   Special Requests BOTTLES DRAWN AEROBIC ONLY 5CC   Final   Culture  Setup Time     Final   Value: 01/05/2014 22:48     Performed at [facility]   Culture     Final   Value: STAPHYLOCOCCUS AUREUS     Note: SUSCEPTIBILITIES PERFORMED ON PREVIOUS CULTURE WITHIN THE LAST 5 DAYS.     Note: Gram Stain Report Called to,Read Back By and Verified With: Ventura Bruns RN on 01/06/14 at 06:45 by Rise Mu     Performed at [facility]   Report Status 01/08/2014 FINAL   Final  CULTURE, BLOOD (ROUTINE X 2)     Status: None   Collection Time    01/06/14  3:35 PM      Result Value Ref Range Status   Specimen Description BLOOD LEFT ANTECUBITAL   Final   Special Requests     Final   Value: BOTTLES DRAWN AEROBIC AND ANAEROBIC 10CC AER 5CC  ANA   Culture  Setup Time     Final   Value: 01/06/2014 19:02     Performed at [facility]   Culture     Final   Value: STAPHYLOCOCCUS AUREUS     Note: RIFAMPIN AND GENTAMICIN SHOULD NOT BE USED AS SINGLE DRUGS FOR TREATMENT OF  STAPH INFECTIONS.     Note: Gram Stain Report Called to,Read Back By and Verified With: Debbora Dus 01/08/14 AT 0100 RIDK     Performed at [facility]   Report Status 01/10/2014 FINAL   Final   Organism ID, Bacteria STAPHYLOCOCCUS AUREUS   Final  CULTURE, BLOOD (ROUTINE X 2)     Status: None   Collection Time    01/06/14  3:45 PM      Result Value Ref Range Status   Specimen Description BLOOD LEFT HAND   Final   Special Requests     Final   Value: BOTTLES DRAWN AEROBIC AND ANAEROBIC 10CC AER 5CC ANA   Culture  Setup Time     Final   Value: 01/06/2014 19:00     Performed at [facility]   Culture     Final   Value:        BLOOD CULTURE RECEIVED NO GROWTH TO DATE CULTURE WILL BE HELD FOR 5 DAYS BEFORE ISSUING A FINAL NEGATIVE REPORT     Performed at [facility]   Report Status PENDING   Incomplete  CULTURE, BLOOD (ROUTINE X 2)     Status: None   Collection Time    01/08/14  6:56 AM      Result Value Ref Range Status   Specimen Description BLOOD LEFT HAND   Final   Special Requests BOTTLES DRAWN AEROBIC AND ANAEROBIC 10CC   Final   Culture  Setup Time     Final   Value: 01/08/2014 10:12     Performed at [facility]   Culture     Final   Value:        BLOOD CULTURE RECEIVED NO GROWTH TO DATE CULTURE WILL BE HELD FOR 5 DAYS BEFORE ISSUING A FINAL NEGATIVE REPORT     Performed at [facility]   Report Status PENDING   Incomplete  CULTURE, BLOOD (ROUTINE X 2)     Status: None   Collection Time    01/08/14  7:05 AM      Result Value Ref Range Status   Specimen Description BLOOD BLOOD LEFT FOREARM   Final   Special Requests BOTTLES DRAWN AEROBIC ONLY [facility]   Final   Culture  Setup Time     Final   Value: 01/08/2014 10:12     Performed at [facility]   Culture     Final   Value:        BLOOD CULTURE RECEIVED NO GROWTH TO DATE CULTURE WILL BE HELD FOR 5 DAYS BEFORE ISSUING A FINAL NEGATIVE REPORT     Performed at [facility]   Report Status PENDING   Incomplete   Studies/Results: Ct Abdomen Pelvis Wo Contrast  01/11/2014   CLINICAL DATA:  Sepsis.  Fever.  EXAM: CT ABDOMEN AND PELVIS WITHOUT CONTRAST  TECHNIQUE: Multidetector CT imaging of the abdomen and pelvis was performed following the standard protocol without IV contrast.  COMPARISON:  01/04/2014  FINDINGS: Increasing dependent bibasilar opacities, likely atelectasis. Heart is normal size. Trace effusions present.  Subtle nodular contours of the liver are again noted  suggesting cirrhosis. No visible focal abnormality on this unenhanced study. Spleen, pancreas, kidneys have an unremarkable unenhanced appearance. Right adrenal unremarkable. Mild diffuse enlargement of the left renal gland compatible with hyperplasia.  Small amount of free fluid in the cul-de-sac. Uterus, adnexae and urinary bladder are unremarkable. Stomach, large and small bowel are grossly unremarkable. Appendix is visualized and is normal. No free air or adenopathy. Aorta is normal caliber.  No acute bony abnormality or focal bone lesion.  IMPRESSION: Increasing bibasilar densities, likely atelectasis. Suspect trace effusions.  Mildly nodular contours of the liver again noted suggesting cirrhosis.  Trace free fluid in the pelvis.  No acute findings.   Electronically Signed   By: Rolm Baptise M.D.   On: 01/11/2014 00:05   Mr Thoracic Spine Wo Contrast  01/11/2014   CLINICAL DATA:  Evaluate fever of unknown origin.  EXAM: MRI THORACIC AND LUMBAR SPINE WITHOUT CONTRAST  TECHNIQUE: Multiplanar and multiecho pulse sequences of the thoracic and lumbar spine were obtained without intravenous contrast.  COMPARISON:  CT of the abdomen and pelvis January 14, 2014  FINDINGS: MR THORACIC SPINE FINDINGS  Thoracic vertebral bodies and posterior elements are intact and aligned with maintenance of thoracic kyphosis. Sub cm bright T1 and bright T2 hemangioma T7. No abnormal STIR signal. Intervertebral discs  demonstrate normal morphology and signal characteristics.  The ventral thoracic spinal cord deformity due to disc protrusion as described below, the spinal cord is overall normal in morphology and signal characteristics the conus medullaris which terminates at T12-L1. No abnormal epidural fluid collections. Partially imaged small bilateral pleural effusions. Mild subcutaneous dependent edema within the lower thoracic spine.  Minimal annular bulging at C6-7. At T7-8 is moderate left central disc protrusion with possible annular fissure resulting in mild to moderate canal stenosis, left ventral spinal cord deformity. Possible small left subarticular to extra foraminal T8-9 disc protrusion resulting in moderate left T8-9 neural foraminal narrowing.  MR LUMBAR SPINE FINDINGS  Lumbar vertebral bodies and posterior elements are intact and aligned and maintenance of lumbar lordosis. Intervertebral discs demonstrate normal morphology and signal characteristics. No abnormal bone marrow signal, specifically no bright STIR signal to suggest acute osseous process.  Conus medullaris terminates at T12-L1 and appears normal in morphology and signal characteristics. 1 mm bright T1 signal along the filum terminale may reflect fibrolipomatous changes without cord tethering. Mild paraspinal muscle atrophy. Dependent subcutaneous edema and grade 1 paraspinal muscle strain.  Level by level evaluation:  T12-L1, L1-2, L2-3, L3-4 and L4-5: No disc bulge. Mild facet arthropathy without canal stenosis or neural foraminal narrowing.  L5-S1: No disc bulge. Moderate facet arthropathy. No canal stenosis. Mild neural foraminal narrowing.  IMPRESSION: MR THORACIC SPINE IMPRESSION  No findings of discitis, osteomyelitis nor epidural abscess on this nonenhanced examination.  Thoracic spondylosis, including T7-8 moderate left central disc protrusion resulting in mild to moderate canal stenosis. Possible left subarticular and T8-9 small disc  protrusion resulting in moderate left T8-9 neural foraminal narrowing.  MR LUMBAR SPINE IMPRESSION  No findings of discitis, osteomyelitis nor epidural abscess on this nonenhanced examination.  Grade 1 paraspinal muscle strain.  Facet arthropathy without neurocompressive changes.   Electronically Signed   By: Elon Alas   On: 01/11/2014 00:13   Mr Lumbar Spine Wo Contrast  01/11/2014   CLINICAL DATA:  Evaluate fever of unknown origin.  EXAM: MRI THORACIC AND LUMBAR SPINE WITHOUT CONTRAST  TECHNIQUE: Multiplanar and multiecho pulse sequences of the thoracic and lumbar spine were obtained without intravenous  contrast.  COMPARISON:  CT of the abdomen and pelvis January 14, 2014  FINDINGS: MR THORACIC SPINE FINDINGS  Thoracic vertebral bodies and posterior elements are intact and aligned with maintenance of thoracic kyphosis. Sub cm bright T1 and bright T2 hemangioma T7. No abnormal STIR signal. Intervertebral discs demonstrate normal morphology and signal characteristics.  The ventral thoracic spinal cord deformity due to disc protrusion as described below, the spinal cord is overall normal in morphology and signal characteristics the conus medullaris which terminates at T12-L1. No abnormal epidural fluid collections. Partially imaged small bilateral pleural effusions. Mild subcutaneous dependent edema within the lower thoracic spine.  Minimal annular bulging at C6-7. At T7-8 is moderate left central disc protrusion with possible annular fissure resulting in mild to moderate canal stenosis, left ventral spinal cord deformity. Possible small left subarticular to extra foraminal T8-9 disc protrusion resulting in moderate left T8-9 neural foraminal narrowing.  MR LUMBAR SPINE FINDINGS  Lumbar vertebral bodies and posterior elements are intact and aligned and maintenance of lumbar lordosis. Intervertebral discs demonstrate normal morphology and signal characteristics. No abnormal bone marrow signal, specifically  no bright STIR signal to suggest acute osseous process.  Conus medullaris terminates at T12-L1 and appears normal in morphology and signal characteristics. 1 mm bright T1 signal along the filum terminale may reflect fibrolipomatous changes without cord tethering. Mild paraspinal muscle atrophy. Dependent subcutaneous edema and grade 1 paraspinal muscle strain.  Level by level evaluation:  T12-L1, L1-2, L2-3, L3-4 and L4-5: No disc bulge. Mild facet arthropathy without canal stenosis or neural foraminal narrowing.  L5-S1: No disc bulge. Moderate facet arthropathy. No canal stenosis. Mild neural foraminal narrowing.  IMPRESSION: MR THORACIC SPINE IMPRESSION  No findings of discitis, osteomyelitis nor epidural abscess on this nonenhanced examination.  Thoracic spondylosis, including T7-8 moderate left central disc protrusion resulting in mild to moderate canal stenosis. Possible left subarticular and T8-9 small disc protrusion resulting in moderate left T8-9 neural foraminal narrowing.  MR LUMBAR SPINE IMPRESSION  No findings of discitis, osteomyelitis nor epidural abscess on this nonenhanced examination.  Grade 1 paraspinal muscle strain.  Facet arthropathy without neurocompressive changes.   Electronically Signed   By: Elon Alas   On: 01/11/2014 00:13   Medications: I have reviewed the patient's current medications. Scheduled Meds: . sodium chloride   Intravenous Once  . abacavir  300 mg Oral BID  . acyclovir  800 mg Oral Daily  . aspirin EC  81 mg Oral Daily  .  ceFAZolin (ANCEF) IV  2 g Intravenous Q12H  . cloNIDine  0.3 mg Oral TID  . darbepoetin (ARANESP) injection - NON-DIALYSIS  40 mcg Subcutaneous Q Sat-1800  . Darunavir Ethanolate  800 mg Oral Q breakfast  . enoxaparin (LOVENOX) injection  30 mg Subcutaneous Q24H  . hydrALAZINE  50 mg Oral 3 times per day  . hydrocerin   Topical BID  . insulin aspart  0-9 Units Subcutaneous TID WC  . insulin glargine  20 Units Subcutaneous QHS  .  labetalol  600 mg Oral BID  . lacosamide  150 mg Oral BID  . lactulose  20 g Oral TID  . lamiVUDine  50 mg Oral Daily  . levETIRAcetam  500 mg Oral BID  . magic mouthwash w/lidocaine  5 mL Oral TID  . pantoprazole  20 mg Oral Once  . ritonavir  100 mg Oral Q breakfast  . sodium bicarbonate  650 mg Oral BID  . sodium chloride  3 mL Intravenous Q12H  Continuous Infusions:  PRN Meds:.acetaminophen, hydrOXYzine, menthol-cetylpyridinium, ondansetron (ZOFRAN) IV, ondansetron Assessment/Plan: Principal Problem:   Sepsis due to Staphylococcus aureus Active Problems:   HIV disease   CKD (chronic kidney disease) stage 4, GFR 15-29 ml/min   Diabetes   Anemia of chronic disease   HCV (hepatitis C virus)   Metabolic acidosis, increased anion gap   Altered mental state   Hepatic encephalopathy   Ascites   Fever, unspecified   Assessment: Michelle [name] is a 53 year old woman with PMH significant for HIV and Hep C coinfection complicated by cirrhotic liver, CKD stage IV, stroke, CHF, DMII who was admitted after being brought to the ED by family due to now resolved hepatic encephalopathy secondary to MSSA bacteremia. Today is antibiotics day 7.  Sepsis due to MSSA: She is no longer septic and has been afebrile since 8/14. Her mental status remains unchanged and at baseline. Michelle [name] had a fever of 102.68F (tmax) on 8/9 with blood cultures that grew MSSA in 2/2 vials. She received vancomycin for 2 days before discontinuing based on sensitivities. She is on cefazolin day 5. The source of her bacteremia was likely her port-a-cath, which was placed on 12/05/13, and was removed during this hospitalization on 8/11. She continued to spike fevers until 8/13, but is now afebrile >48 hrs. A splinter hemorrhage on her left 5th digit was noted, but other signs of endocarditis on exam were absent and no new murmur was appreciated. TEE on 8/14 showed no signs of vegetations/endocarditis. MRI lumbar/thoracic spine was negative  for epidural abscess. CT abd/pelvis was negative for intraabdominal process. SBP is also less likely given her abdomin is soft, non-tender, and there is minimal ascites.  -Continue cefazolin (day 5), patient has been afebrile for >48 hours, so will place PICC line after consulting nephrology about placement (patient is currently not a candidate for hemodialysis, but may be in future) -Continue monitoring for signs and sx of SBP  -Order BCx2 if she spikes a fever   Acute on chronic Anemia: Her Hg baseline ranges between 6-8. She dropped to 6.8 on 8/14, and received 1 unit of pRBC but her post-transfusion CBC was 6.6. She had bleeding around her peripheral IV on the night of 8/14 but denied hemoptysis, hematuria, hematochezia, or melena. She has been worked up for hemolytic anemia in the past. 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  -GI performed EGD and colonoscopy on 7/23 for +FOBT: nodular gastric mucosa and normal colon, terminal ileum which yielded benign pathology of the colon, ileum, and duodenum with minimal chronic inflammation.  -Dr. Beryle Beams in Hematology/Oncology recommends pRBC transfusion only if she is symptomatic.  -Bone marrow biopsy on 7/2 inconclusive and non-diagnostic. She is on ART which can cause suppression of all cell lines.   -Retic count unremarkable, LDH elevated but in setting of large ecchymosis in her left upper chest and arm.  -Aranesp injection recently per Dr. Lorelee New in Nephrology, and seems to have maintained stable HgB for a while. -Received Aranesp 25mg yesterday and will continue giving weekly per Dr. SJonnie Finnerin Nephrology -F/u CBC  Acute on Chronic Renal Failure : Cr remains elevated, trending down from 3.38 yesterday to 3.19 today. Her baseline Cr is between 2.4-2.8. This is likely multifactorial from furosemide use, ACEi use, and possibly  Vancomycin use. Not a hemodialysis candidate and is voiding appropriately. -Strict in and outs (foley placed yesterday) -Holding furosemide  and ACEi  -CMP in am   Hepatic Encephalopathy: Resolved. She arrived lethargic which was concerning for acute hepatic encephalopathy. Ammonia level on admission was 127 and increased to 161, but she is at her baseline mental status and has remained at baseline since 8/11.  -Continue home lactulose PO PRN titrated to 3BM/day  -Diet heart healthy/carb mod only if fully alert   Ascites: Soft, obese, non-tender abdomen, mildly distended and not tense. CT abdomen/pelvis on presentation showed only mild ascites and no acute intraabdominal process. She had mild RUQ pain but [place] Abd was unremarkable for acute gall bladder disease. CT/Abd pelvis with no acute intraabdominal process. Not concerned for SBP at this time. Bilirubin elevated but trending down at 3.6 today.  -Will consider diagnostic/therapeutic tap of ascitic fluid if she declines  -CMP in am   History of seizure: Patient was on dilantin and keppra at home. This may have precipitated her somnolence, as her corrected dilantin level on admission was elevated at 25. Dilantin was discontinued and vimpat was added due to concern of renal function. Had pseudoseizures on eve of 8/12 and 8/13 with voluntary jerking movements, following commands, fully conversational, with no confusion.  -Continue vimpat  -Continue Keppra  -NPO if seizure occurs due to aspiration precautions  Hypokalemia: resolved. K is 3.8 today. Will continue monitoring and provide gentle repletion given her CKD4 if K is at or <3.0.   Hepatitis C: Genotype in December 2014. Genotype 1b, viral load of [id number], Log of 6.63.  -She will f/u with Dr. Baxter Flattery in ID for possible treatment of Hep C   HIV disease: Well controlled with viral load undectable in 09/08/13. CD4 count in April at 280. Will recheck labs as dilantin level has been super  therapeutic and can interact with antiretrovirals. 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 Secondary to Hepatitis C infection. 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 Platelets and INR stable.  -f/u INR  DMII: Last Hg A1C 7.5% on 11/13/13. On Lantus 30 u qHS and Novolog 10-25 u actid at home.  -Lantus 20 units qHS  -Sensitive SSI   Diet:  HH, Carb mod   DVT prophylaxis: SCDs   Dispo: Disposition is deferred at this time, awaiting improvement of current medical problems. Anticipated discharge in approximately 1-2 day(s). Social worker working on setting up SNF as Michelle Keough Ayel need IV antibiotics for 4 weeks and her sister does not feel comfortable managing the PICC line and IV antibiotics.  This is a [employer] information officere.  The care of the patient was discussed with Dr. KennHayes Ludwig the assessment and plan formulated with their assistance.  Please see their attached note for official documentation of the daily encounter.   LOS: 9 days   NidaLin Givensd Student 01/12/2014, 9:08 AM

## 2014-01-13 ENCOUNTER — Encounter (HOSPITAL_COMMUNITY): Payer: Self-pay | Admitting: Cardiovascular Disease

## 2014-01-13 LAB — COMPREHENSIVE METABOLIC PANEL
ALT: 5 U/L (ref 0–35)
ANION GAP: 17 — AB (ref 5–15)
AST: 87 U/L — ABNORMAL HIGH (ref 0–37)
Albumin: 1.4 g/dL — ABNORMAL LOW (ref 3.5–5.2)
Alkaline Phosphatase: 704 U/L — ABNORMAL HIGH (ref 39–117)
BUN: 57 mg/dL — ABNORMAL HIGH (ref 6–23)
CALCIUM: 7.9 mg/dL — AB (ref 8.4–10.5)
CO2: 14 mEq/L — ABNORMAL LOW (ref 19–32)
Chloride: 103 mEq/L (ref 96–112)
Creatinine, Ser: 3.37 mg/dL — ABNORMAL HIGH (ref 0.50–1.10)
GFR calc non Af Amer: 14 mL/min — ABNORMAL LOW (ref >90)
GFR, EST AFRICAN AMERICAN: 17 mL/min — AB (ref >90)
GLUCOSE: 212 mg/dL — AB (ref 70–99)
Potassium: 3.6 mEq/L — ABNORMAL LOW (ref 3.7–5.3)
Sodium: 134 mEq/L — ABNORMAL LOW (ref 137–147)
TOTAL PROTEIN: 5.7 g/dL — AB (ref 6.0–8.3)
Total Bilirubin: 4.3 mg/dL — ABNORMAL HIGH (ref 0.3–1.2)

## 2014-01-13 LAB — CBC
HCT: 19.8 % — ABNORMAL LOW (ref 36.0–46.0)
Hemoglobin: 6.7 g/dL — CL (ref 12.0–15.0)
MCH: 35.3 pg — AB (ref 26.0–34.0)
MCHC: 33.8 g/dL (ref 30.0–36.0)
MCV: 104.2 fL — ABNORMAL HIGH (ref 78.0–100.0)
Platelets: 229 10*3/uL (ref 150–400)
RBC: 1.9 MIL/uL — AB (ref 3.87–5.11)
RDW: 14.2 % (ref 11.5–15.5)
WBC: 8.4 10*3/uL (ref 4.0–10.5)

## 2014-01-13 LAB — GLUCOSE, CAPILLARY
GLUCOSE-CAPILLARY: 252 mg/dL — AB (ref 70–99)
GLUCOSE-CAPILLARY: 276 mg/dL — AB (ref 70–99)
Glucose-Capillary: 247 mg/dL — ABNORMAL HIGH (ref 70–99)
Glucose-Capillary: 265 mg/dL — ABNORMAL HIGH (ref 70–99)
Glucose-Capillary: 275 mg/dL — ABNORMAL HIGH (ref 70–99)

## 2014-01-13 LAB — T-HELPER CELLS (CD4) COUNT (NOT AT ARMC)
CD4 % Helper T Cell: 24 % — ABNORMAL LOW (ref 33–55)
CD4 T CELL ABS: 240 /uL — AB (ref 400–2700)

## 2014-01-13 LAB — CULTURE, BLOOD (ROUTINE X 2): CULTURE: NO GROWTH

## 2014-01-13 LAB — HAPTOGLOBIN: Haptoglobin: 147 mg/dL (ref 45–215)

## 2014-01-13 MED ORDER — STERILE WATER FOR INJECTION IV SOLN
150.0000 meq | INTRAVENOUS | Status: DC
  Administered 2014-01-13 – 2014-01-15 (×4): 150 meq via INTRAVENOUS
  Filled 2014-01-13 (×6): qty 850

## 2014-01-13 MED ORDER — CEFAZOLIN SODIUM 1-5 GM-% IV SOLN
1.0000 g | INTRAVENOUS | Status: DC
  Filled 2014-01-13: qty 50

## 2014-01-13 MED ORDER — CEFAZOLIN SODIUM 1-5 GM-% IV SOLN
1.0000 g | Freq: Two times a day (BID) | INTRAVENOUS | Status: DC
  Filled 2014-01-13: qty 50

## 2014-01-13 MED ORDER — WHITE PETROLATUM GEL
Status: AC
  Administered 2014-01-13: 0.2
  Filled 2014-01-13: qty 5

## 2014-01-13 MED ORDER — FLUCONAZOLE 150 MG PO TABS
150.0000 mg | ORAL_TABLET | Freq: Once | ORAL | Status: AC
  Administered 2014-01-13: 150 mg via ORAL
  Filled 2014-01-13: qty 1

## 2014-01-13 MED ORDER — CEFAZOLIN SODIUM 1-5 GM-% IV SOLN
1.0000 g | Freq: Two times a day (BID) | INTRAVENOUS | Status: DC
  Administered 2014-01-14 – 2014-01-15 (×3): 1 g via INTRAVENOUS
  Filled 2014-01-13 (×5): qty 50

## 2014-01-13 NOTE — Progress Notes (Signed)
Agree 

## 2014-01-13 NOTE — Progress Notes (Signed)
3 Days Post-Op  Subjective: HIV infection CKD; Hep C PAC infection Removed 8/11 MSSA+ Now needs IV antibiotics x 4 weeks Plan for dc to SNF tomorrow Request has been made for tunneled central catheter placement per Dr Rogelia Boga   Objective: Vital signs in last 24 hours: Temp:  [98.2 F (36.8 C)-100.3 F (37.9 C)] 98.2 F (36.8 C) (08/17 1354) Pulse Rate:  [71-81] 76 (08/17 1354) Resp:  [18] 18 (08/17 1354) BP: (133-162)/(67-78) 162/78 mmHg (08/17 1354) SpO2:  [97 %-100 %] 100 % (08/17 1354) Weight:  [81.6 kg (179 lb 14.3 oz)] 81.6 kg (179 lb 14.3 oz) (08/17 0519) Last BM Date: 01/13/14  Intake/Output from previous day: 08/16 0701 - 08/17 0700 In: 100 [IV Piggyback:100] Out: 400 [Urine:400] Intake/Output this shift: Total I/O In: 360 [P.O.:360] Out: -   PE:  Afeb; vss Pt is aware of name; dob; time and place Heart: RRR Lungs: CTA  Lab Results:   Recent Labs  01/12/14 0850 01/13/14 0539  WBC 7.6 8.4  HGB 7.0* 6.7*  HCT 21.3* 19.8*  PLT 202 229   BMET  Recent Labs  01/12/14 0643 01/13/14 0539  NA 136* 134*  K 3.8 3.6*  CL 107 103  CO2 13* 14*  GLUCOSE 221* 212*  BUN 55* 57*  CREATININE 3.19* 3.37*  CALCIUM 7.8* 7.9*   PT/INR  Recent Labs  01/12/14 0850  LABPROT 12.7  INR 0.95   ABG No results found for this basename: PHART, PCO2, PO2, HCO3,  in the last 72 hours  Studies/Results: No results found.  Anti-infectives: Anti-infectives   Start     Dose/Rate Route Frequency Ordered Stop   01/14/14 0600  ceFAZolin (ANCEF) IVPB 1 g/50 mL premix     1 g 100 mL/hr over 30 Minutes Intravenous Every 12 hours 01/13/14 1320     01/13/14 1600  ceFAZolin (ANCEF) IVPB 1 g/50 mL premix  Status:  Discontinued     1 g 100 mL/hr over 30 Minutes Intravenous Every 12 hours 01/13/14 1007 01/13/14 1304   01/13/14 1400  ceFAZolin (ANCEF) IVPB 1 g/50 mL premix  Status:  Discontinued     1 g 100 mL/hr over 30 Minutes Intravenous Every 24 hours 01/13/14 1304  01/13/14 1320   01/13/14 0945  fluconazole (DIFLUCAN) tablet 150 mg     150 mg Oral  Once 01/13/14 0933 01/13/14 1302   01/09/14 0630  vancomycin (VANCOCIN) IVPB 750 mg/150 ml premix  Status:  Discontinued     750 mg 150 mL/hr over 60 Minutes Intravenous Every 48 hours 01/07/14 1418 01/08/14 1433   01/08/14 1000  lamiVUDine (EPIVIR) 10 MG/ML solution 50 mg     50 mg Oral Daily 01/07/14 1517     01/07/14 0600  vancomycin (VANCOCIN) IVPB 750 mg/150 ml premix  Status:  Discontinued     750 mg 150 mL/hr over 60 Minutes Intravenous Every 24 hours 01/06/14 0701 01/07/14 1418   01/06/14 1600  ceFAZolin (ANCEF) IVPB 2 g/50 mL premix  Status:  Discontinued     2 g 100 mL/hr over 30 Minutes Intravenous Every 12 hours 01/06/14 1518 01/13/14 1007   01/06/14 1500  lamiVUDine (EPIVIR) 10 MG/ML solution 100 mg  Status:  Discontinued     100 mg Oral Daily 01/06/14 1402 01/07/14 1517   01/06/14 0800  vancomycin (VANCOCIN) 1,250 mg in sodium chloride 0.9 % 250 mL IVPB     1,250 mg 166.7 mL/hr over 90 Minutes Intravenous  Once 01/06/14 0701 01/06/14 1156  01/04/14 1000  lamiVUDine (EPIVIR) tablet 150 mg  Status:  Discontinued     150 mg Oral Daily 01/04/14 0414 01/06/14 1401   01/04/14 1000  abacavir (ZIAGEN) tablet 300 mg     300 mg Oral 2 times daily 01/04/14 0414     01/04/14 1000  acyclovir (ZOVIRAX) tablet 800 mg     800 mg Oral Daily 01/04/14 0414     01/04/14 0800  Darunavir Ethanolate (PREZISTA) tablet 800 mg     800 mg Oral Daily with breakfast 01/04/14 0414     01/04/14 0800  ritonavir (NORVIR) capsule 100 mg     100 mg Oral Daily with breakfast 01/04/14 0414        Assessment/Plan: s/p  +MSSA- infected Port a cath Removed 8/11 Now being dc'd to SNF tomorrow Needs IV antibx x 4 weeks Scheduled now for tunneled central catheter in IR Pt aware of procedure and agreeable to proceed Consent signed andin chart   LOS: 10 days    Sajjad Honea A 01/13/2014

## 2014-01-13 NOTE — Progress Notes (Signed)
Occupational Therapy Treatment Patient Details Name: Michelle [name] MRN: [id number] DOB: 21-Sep-1960 Today's Date: 01/13/2014    History of present illness Patient is a 53 y/o female admitted from home with increased lethargy for the last 24 hours and AMS. Diagnosed with hepatic encephalopathy and ascites. PMH positive for cirrhotic liver secondary to Hep C infection, HIV, CKD stage IV, CVA, DM II, seizures, HTN and chronic anemia.   OT comments  Pt ambulated to sink and performed ADLs. If sister can't provide 24/7 assist at home, recommend SNF for rehab.  Follow Up Recommendations  Home health OT;Supervision/Assistance - 24 hour    Equipment Recommendations  3 in 1 bedside comode    Recommendations for Other Services      Precautions / Restrictions Precautions Precautions: Fall Precaution Comments: Contact precautions. Restrictions Weight Bearing Restrictions: No       Mobility Bed MobilityGeneral bed mobility comments: not assessed  Transfers Overall transfer level: Needs assistance Equipment used: Rolling walker (2 wheeled) Transfers: Sit to/from Stand Sit to Stand: Min assist         General transfer comment: cues for technique and to scoot to edge of seat.        ADL Overall ADL's : Needs assistance/impaired     Grooming: Wash/dry face;Oral care;Standing;Min guard                   Toilet Transfer: Min guard;Ambulation;RW (chair)           Functional mobility during ADLs: Min guard;Rolling walker (Min A to position walker at sink)        Vision                     Perception     Praxis      Cognition   Behavior During Therapy: Agitated Overall Cognitive Status: Impaired/Different from baseline Area of Impairment: Memory;Following commands;Safety/judgement;Problem solving;Attention Orientation Level: Disoriented to;Time;Situation Current Attention Level: Sustained Memory: Decreased short-term memory  Following Commands:  Follows one step commands with increased time Safety/Judgement: Decreased awareness of safety   Problem Solving: Slow processing;Requires verbal cues   Extremity/Trunk Assessment               Exercises     Shoulder Instructions       General Comments      Pertinent Vitals/ Pain       Pain Assessment: 0-10 Pain Score: 8  Pain Location: peri area/buttocks Pain Descriptors / Indicators: Aching Pain Intervention(s): Repositioned;Other (comment) (notified nurse)  Home Living                                          Prior Functioning/Environment              Frequency Min 2X/week     Progress Toward Goals  OT Goals(current goals can now be found in the care plan section)  Progress towards OT goals: Progressing toward goals  Acute Rehab OT Goals Patient Stated Goal: not stated OT Goal Formulation: Patient unable to participate in goal setting Time For Goal Achievement: 01/20/14 Potential to Achieve Goals: Good  Plan Discharge plan remains appropriate    Co-evaluation                 End of Session Equipment Utilized During Treatment: Gait belt;Rolling walker   Activity Tolerance Patient tolerated treatment well   Patient Left  in chair;with call bell/phone within reach;with chair alarm set   Nurse Communication Other (comment) (pain)        Time: [id number] OT Time Calculation (min): 18 min  Charges: OT General Charges $OT Visit: 1 Procedure OT Treatments $Self Care/Home Management : 8-22 mins  Earlie RavelingStraub, Jewelianna Pancoast L OTR/L [phone] 01/13/2014, 12:55 PM

## 2014-01-13 NOTE — Progress Notes (Signed)
CRITICAL VALUE ALERT  Critical value received:  Hgb 6.7  Date of notification:  01/13/14  Time of notification:  0641  Critical value read back: yes  Nurse who received alert:  Idelle Jo, RN  MD notified (1st page):  Waheed  Time of first page:  [phone]  MD notified (2nd page): Waheed  Time of second AESL:7530  Responding MD:  Ripley Fraise  Time MD responded:  [phone]  No new orders per MD.

## 2014-01-13 NOTE — Progress Notes (Signed)
Physical Therapy Treatment Patient Details Name: Trulee M Cressler MRN: [name] DOB: 08/26/1960 Today's Date: 01/13/2014    History of Present Illness Patient is a 53 y/o female admitted from home with increased lethargy for the last 24 hours and AMS. Diagnosed with hepatic encephalopathy and ascites. PMH positive for cirrhotic liver secondary to Hep C infection, HIV, CKD stage IV, CVA, DM II, seizures, HTN and chronic anemia.    PT Comments    Patient continues to progress slowly. Pt more participatory in therapy with sister present in room. Continues to require constant directional cues and verbal cues to perform tasks. Difficulty with sequencing and initiation. Pt improved ambulation distance today however fatigues quickly and is self limiting. If sister not able to provide 24/7 supervision during mobility at home, pt may need ST SNF. Will continue to follow and progress as tolerated.    Follow Up Recommendations  Home health PT;Supervision/Assistance - 24 hour     Equipment Recommendations  None recommended by PT    Recommendations for Other Services       Precautions / Restrictions Precautions Precautions: Fall Precaution Comments: Contact precautions. Restrictions Weight Bearing Restrictions: No    Mobility  Bed Mobility Overal bed mobility: Needs Assistance Bed Mobility: Rolling;Sidelying to Sit Rolling: Supervision Sidelying to sit: Supervision;HOB elevated       General bed mobility comments: Increased time for bed mobility. Use of rails for assist.   Transfers Overall transfer level: Needs assistance Equipment used: Rolling walker (2 wheeled) Transfers: Sit to/from UGI CorporationStand;Stand Pivot Transfers Sit to Stand: Min assist         General transfer comment: Min A to stand from EOB. Required tactile cues for hand placement and step by step sequencing prior to standing. Took ~3 minutes to get to standing position due to impaired initiation and difficulty sequencing.  Transferred to chair with constant cues for retro steps to get bottom closer to chair.  Ambulation/Gait Ambulation/Gait assistance: Min assist Ambulation Distance (Feet): 60 Feet Assistive device: Rolling walker (2 wheeled) Gait Pattern/deviations: Step-through pattern;Trunk flexed Gait velocity: Decreased initially progressing to increased gait speed to get to chair.   General Gait Details: Different speeds noted during gait training depending on distractability vs knowing end destination. VC for RW management and proximity. Min A to negotiate walker during turns.   Stairs            Wheelchair Mobility    Modified Rankin (Stroke Patients Only)       Balance Overall balance assessment: Needs assistance   Sitting balance-Leahy Scale: Fair Sitting balance - Comments: Able to sit EOB without support, however required assist with repositioning and scooting. Self limiting. "you need to do this side."   Standing balance support: During functional activity Standing balance-Leahy Scale: Poor Standing balance comment: Able to stand unsupported for a ~1 minute scratching back side. Required constant cues to safely get into chair. Use of RW for support for safety during gait training due to impaired safety awareness/balance.                    Cognition Arousal/Alertness: Awake/alert Behavior During Therapy: Flat affect Overall Cognitive Status: Impaired/Different from baseline Area of Impairment: Attention;Memory;Following commands;Safety/judgement;Problem solving Orientation Level: Disoriented to;Time;Situation Current Attention Level: Focused Memory: Decreased short-term memory Following Commands: Follows one step commands inconsistently;Follows one step commands with increased time Safety/Judgement: Decreased awareness of safety   Problem Solving: Slow processing;Decreased initiation;Difficulty sequencing;Requires verbal cues General Comments: Pt perseverating on  phrases said by  therapist, "scoot forward" over and over after task has been accomplished. Increased time to perform all mobility, increased verbal and demonstrational cues to perform transfers. Easily distracted.     Exercises      General Comments General comments (skin integrity, edema, etc.): Pt's sister present during treatment. Pt more participatory with family present.      Pertinent Vitals/Pain Pain Assessment: No/denies pain    Home Living                      Prior Function            PT Goals (current goals can now be found in the care plan section) Progress towards PT goals: Progressing toward goals (slowly.)    Frequency  Min 3X/week    PT Plan Current plan remains appropriate    Co-evaluation             End of Session Equipment Utilized During Treatment: Gait belt Activity Tolerance: Patient limited by fatigue Patient left: in chair;with call bell/phone within reach;with chair alarm set;with family/visitor present     Time: 1123-1150 PT Time Calculation (min): 27 min  Charges:  $Gait Training: 8-22 mins $Therapeutic Activity: 8-22 mins                    G CodesAlvie Heidelberg A 01-22-14, 12:05 PM Alvie Heidelberg, PT, DPT [phone]

## 2014-01-13 NOTE — Progress Notes (Signed)
Subjective: No overnight events. She had Tmax of 100.39F again. She complained of vaginal white discharge and itching today. Her UA is remarkable for yeast.   Objective: Vital signs in last 24 hours: Filed Vitals:   01/12/14 1057 01/12/14 1312 01/12/14 2121 01/13/14 0519  BP: 111/62 115/68 133/75 142/67  Pulse: 60  71 81  Temp:   98.3 F (36.8 C) 100.3 F (37.9 C)  TempSrc:   Oral Oral  Resp:   18 18  Height:      Weight:    179 lb 14.3 oz (81.6 kg)  SpO2:   100% 97%   Weight change: -4 lb 13.6 oz (-2.2 kg)  Intake/Output Summary (Last 24 hours) at 01/13/14 0935 Last data filed at 01/13/14 0525  Gross per 24 hour  Intake    100 ml  Output    400 ml  Net   -300 ml   Vital signs reviewed  Gen; Sitting up in bed in NAD, eating her breakfast HEENT: mild scleral icterus, MM  CV: RRR  Chest: Peripheral IV to her left chest with ecchymosis that is improving it extends to her medial left arm with TTP, no increased warmth, no erythema. Right chest with site of port-a-cath that was removed with no edema/erythema, covered with dressing that is c/d/i.  Resp: bibasilar crackles bilaterally, normal respiratory effort, no wheezing  Abd: mildly distended, non tender, soft, bowel sounds present.  Ext: no peripheral edema, dry skin, no teleangiectasia.  Neuro: Alert and oriented x3, no slurred speech, no asterixis, moves her extremities voluntairly  Lab Results: Basic Metabolic Panel:  Recent Labs Lab 01/08/14 2340  01/12/14 0643 01/13/14 0539  NA 136*  < > 136* 134*  K 3.5*  < > 3.8 3.6*  CL 103  < > 107 103  CO2 16*  < > 13* 14*  GLUCOSE 256*  < > 221* 212*  BUN 49*  < > 55* 57*  CREATININE 2.77*  < > 3.19* 3.37*  CALCIUM 7.2*  < > 7.8* 7.9*  MG 1.7  --   --   --   PHOS 3.2  --   --   --   < > = values in this interval not displayed. Liver Function Tests:  Recent Labs Lab 01/12/14 0643 01/13/14 0539  AST 58* 87*  ALT <5 5  ALKPHOS 574* 704*  BILITOT 3.6* 4.3*    PROT 5.3* 5.7*  ALBUMIN 1.3* 1.4*   CBC:  Recent Labs Lab 01/09/14 1015 01/10/14 0540  01/12/14 0850 01/13/14 0539  WBC 8.6 7.7  < > 7.6 8.4  NEUTROABS 6.8 5.6  --   --   --   HGB 7.0* 6.8*  < > 7.0* 6.7*  HCT 20.9* 20.4*  < > 21.3* 19.8*  MCV 105.0* 107.9*  < > 108.7* 104.2*  PLT 125* 138*  < > 202 229  < > = values in this interval not displayed. Cardiac Enzymes:  Recent Labs Lab 01/08/14 2340 01/09/14 0612 01/09/14 1153  CKTOTAL  --  59  --   TROPONINI 0.34* <0.30 <0.30   CBG:  Recent Labs Lab 01/11/14 2138 01/12/14 0920 01/12/14 1137 01/12/14 1723 01/12/14 2146 01/13/14 0758  GLUCAP 131* 277* 264* 209* 215* 252*   Coagulation:  Recent Labs Lab 01/08/14 0925 01/12/14 0850  LABPROT 12.0 12.7  INR 0.89 0.95   Anemia Panel:  Recent Labs Lab 01/11/14 1057  RETICCTPCT 1.2     Urinalysis:  Recent Labs Lab 01/06/14  1141 01/11/14 1627  COLORURINE ORANGE* AMBER*  LABSPEC 1.021 1.019  PHURINE 5.5 5.5  GLUCOSEU 100* 100*  HGBUR NEGATIVE NEGATIVE  BILIRUBINUR LARGE* LARGE*  KETONESUR NEGATIVE 15*  PROTEINUR >300* >300*  UROBILINOGEN 1.0 0.2  NITRITE NEGATIVE NEGATIVE  LEUKOCYTESUR TRACE* SMALL*    Micro Results: Recent Results (from the past 240 hour(s))  MRSA PCR SCREENING     Status: Abnormal   Collection Time    01/04/14  4:35 AM      Result Value Ref Range Status   MRSA by PCR POSITIVE (*) NEGATIVE Final   Comment:            The GeneXpert MRSA Assay (FDA     approved for NASAL specimens     only), is one component of a     comprehensive MRSA colonization     surveillance program. It is not     intended to diagnose MRSA     infection nor to guide or     monitor treatment for     MRSA infections.     RESULT CALLED TO, READ BACK BY AND VERIFIED WITH:     R.ZELLNER,RN [id number] 01/04/14 M.CAMPBELL  CULTURE, BLOOD (ROUTINE X 2)     Status: None   Collection Time    01/05/14  3:30 PM      Result Value Ref Range Status   Specimen  Description BLOOD LEFT ARM   Final   Special Requests     Final   Value: BOTTLES DRAWN AEROBIC AND ANAEROBIC 10CC BLUE, 5CC RED   Culture  Setup Time     Final   Value: 01/05/2014 22:48     Performed at [facility]   Culture     Final   Value: STAPHYLOCOCCUS AUREUS     Note: RIFAMPIN AND GENTAMICIN SHOULD NOT BE USED AS SINGLE DRUGS FOR TREATMENT OF STAPH INFECTIONS.     Note: Gram Stain Report Called to,Read Back By and Verified With: Volanda NapoleonNikki Faucette RN on 01/06/14 at 06:45 by Christie NottinghamAnne Skeen     Performed at [facility]   Report Status 01/08/2014 FINAL   Final   Organism ID, Bacteria STAPHYLOCOCCUS AUREUS   Final  CULTURE, BLOOD (ROUTINE X 2)     Status: None   Collection Time    01/05/14  3:42 PM      Result Value Ref Range Status   Specimen Description BLOOD BLOOD LEFT FOREARM   Final   Special Requests BOTTLES DRAWN AEROBIC ONLY 5CC   Final   Culture  Setup Time     Final   Value: 01/05/2014 22:48     Performed at [facility]   Culture     Final   Value: STAPHYLOCOCCUS AUREUS     Note: SUSCEPTIBILITIES PERFORMED ON PREVIOUS CULTURE WITHIN THE LAST 5 DAYS.     Note: Gram Stain Report Called to,Read Back By and Verified With: Volanda NapoleonNikki Faucette RN on 01/06/14 at 06:45 by Christie NottinghamAnne Skeen     Performed at [facility]   Report Status 01/08/2014 FINAL   Final  CULTURE, BLOOD (ROUTINE X 2)     Status: None   Collection Time    01/06/14  3:35 PM      Result Value Ref Range Status   Specimen Description BLOOD LEFT ANTECUBITAL   Final   Special Requests     Final   Value: BOTTLES DRAWN AEROBIC AND ANAEROBIC 10CC AER 5CC ANA   Culture  Setup  Time     Final   Value: 01/06/2014 19:02     Performed at [facility]   Culture     Final   Value: STAPHYLOCOCCUS AUREUS     Note: RIFAMPIN AND GENTAMICIN SHOULD NOT BE USED AS SINGLE DRUGS FOR TREATMENT OF STAPH INFECTIONS.     Note: Gram Stain Report Called to,Read Back By and Verified With: Junius Argyle 01/08/14  AT 0100 RIDK     Performed at [facility]   Report Status 01/10/2014 FINAL   Final   Organism ID, Bacteria STAPHYLOCOCCUS AUREUS   Final  CULTURE, BLOOD (ROUTINE X 2)     Status: None   Collection Time    01/06/14  3:45 PM      Result Value Ref Range Status   Specimen Description BLOOD LEFT HAND   Final   Special Requests     Final   Value: BOTTLES DRAWN AEROBIC AND ANAEROBIC 10CC AER 5CC ANA   Culture  Setup Time     Final   Value: 01/06/2014 19:00     Performed at [facility]   Culture     Final   Value:        BLOOD CULTURE RECEIVED NO GROWTH TO DATE CULTURE WILL BE HELD FOR 5 DAYS BEFORE ISSUING A FINAL NEGATIVE REPORT     Performed at [facility]   Report Status PENDING   Incomplete  CULTURE, BLOOD (ROUTINE X 2)     Status: None   Collection Time    01/08/14  6:56 AM      Result Value Ref Range Status   Specimen Description BLOOD LEFT HAND   Final   Special Requests BOTTLES DRAWN AEROBIC AND ANAEROBIC 10CC   Final   Culture  Setup Time     Final   Value: 01/08/2014 10:12     Performed at [facility]   Culture     Final   Value:        BLOOD CULTURE RECEIVED NO GROWTH TO DATE CULTURE WILL BE HELD FOR 5 DAYS BEFORE ISSUING A FINAL NEGATIVE REPORT     Performed at [facility]   Report Status PENDING   Incomplete  CULTURE, BLOOD (ROUTINE X 2)     Status: None   Collection Time    01/08/14  7:05 AM      Result Value Ref Range Status   Specimen Description BLOOD BLOOD LEFT FOREARM   Final   Special Requests BOTTLES DRAWN AEROBIC ONLY [facility]   Final   Culture  Setup Time     Final   Value: 01/08/2014 10:12     Performed at [facility]   Culture     Final   Value:        BLOOD CULTURE RECEIVED NO GROWTH TO DATE CULTURE WILL BE HELD FOR 5 DAYS BEFORE ISSUING A FINAL NEGATIVE REPORT     Performed at [facility]   Report Status PENDING   Incomplete  CULTURE, BLOOD (ROUTINE X 2)     Status: None   Collection Time     01/10/14 12:47 AM      Result Value Ref Range Status   Specimen Description BLOOD LEFT FOREARM   Final   Special Requests     Final   Value: BOTTLES DRAWN AEROBIC AND ANAEROBIC BLUE 10 CC RED 5 CC   Culture  Setup Time     Final   Value: 01/11/2014  13:10     Performed at [facility]     Final   Value:        BLOOD CULTURE RECEIVED NO GROWTH TO DATE CULTURE WILL BE HELD FOR 5 DAYS BEFORE ISSUING A FINAL NEGATIVE REPORT     Performed at [facility]   Report Status PENDING   Incomplete  CULTURE, BLOOD (ROUTINE X 2)     Status: None   Collection Time    01/10/14 12:53 AM      Result Value Ref Range Status   Specimen Description BLOOD LEFT HAND   Final   Special Requests BOTTLES DRAWN AEROBIC AND ANAEROBIC 10 CC   Final   Culture  Setup Time     Final   Value: 01/11/2014 13:10     Performed at [facility]   Culture     Final   Value:        BLOOD CULTURE RECEIVED NO GROWTH TO DATE CULTURE WILL BE HELD FOR 5 DAYS BEFORE ISSUING A FINAL NEGATIVE REPORT     Performed at [facility]   Report Status PENDING   Incomplete   Medications: I have reviewed the patient's current medications. Scheduled Meds: . sodium chloride   Intravenous Once  . abacavir  300 mg Oral BID  . acyclovir  800 mg Oral Daily  . aspirin EC  81 mg Oral Daily  .  ceFAZolin (ANCEF) IV  2 g Intravenous Q12H  . cloNIDine  0.2 mg Oral BID  . darbepoetin (ARANESP) injection - NON-DIALYSIS  40 mcg Subcutaneous Q Sat-1800  . Darunavir Ethanolate  800 mg Oral Q breakfast  . enoxaparin (LOVENOX) injection  30 mg Subcutaneous Q24H  . fluconazole  150 mg Oral Once  . hydrocerin   Topical BID  . insulin aspart  0-9 Units Subcutaneous TID WC  . insulin glargine  20 Units Subcutaneous QHS  . labetalol  300 mg Oral BID  . lacosamide  150 mg Oral BID  . lactulose  20 g Oral TID  . lamiVUDine  50 mg Oral Daily  . levETIRAcetam  500 mg Oral BID  . magic mouthwash w/lidocaine  5 mL  Oral TID  . ritonavir  100 mg Oral Q breakfast  . sodium bicarbonate  650 mg Oral BID  . sodium chloride  3 mL Intravenous Q12H   Continuous Infusions:  PRN Meds:.acetaminophen, diphenhydrAMINE-zinc acetate, hydrOXYzine, menthol-cetylpyridinium, ondansetron (ZOFRAN) IV, ondansetron Assessment/Plan: Ms. 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 She is now on antibiotic day 8.   Sepsis due to MSSA: She does not appear septic and her Tmax in the past 72 hours has been 100.58F. Her mental status is unchanged and at baseline. She had 2/2 BC that grew MSSA. The source of her bacteremia was likely her port-a-cath placed on 12/05/13 which was removed on 8/11. No signs of SBP or endocarditis on physical exam. TEE neg for endocarditis. CT abd/pelvis with minimum ascites. MRI spine with no abscess. She is now on antibiotic day 8. -Will continue cefazolin (now day 7) for 4 weeks total  -Will need central PICC line placed by IR tomorrow morning--NPO after midnight -Continuing to monitor for  s/s of SBP   Acute on chronic Anemia: Asymptomatic. Baseline Hg of 6-8. Her HgB today is 6.7, s/p transfusion of 1 unit of pRBC on 8/14. No s/s of bleeding. Has had extensive work up for hemolytic anemia, anemia of chronic disease, with EGD and colonoscopy unremarkable for potential bleeding sources. This is lkely multifactorial w bone marrow suppression due to ART therapy and ACD 2/2 to her CKD4. Drug induced by hydralazine has also been proposed as a contributing factor since her ANA titer increased with use of this medication. Dr. Cyndie Chime in Hematology/Concology recommends pRBC transfusion only if she is symptomatic.  -Will continue to receive  Aranesp weekly (her last dose was on Saturday, 8/15) per Dr. Arlean Hopping in Nephrology  -Daily CBC -Check anti-histone IgG -She will need repeat ANA and titer in 1 month for evaluation of hydralazine effect  Acute on Chronic Renal Failure: Cr still elevated but stable at 3.3 today. Baseline Cr in the 2.4-2.8 range. This is likely multifactorial from furosemide use, ACEi use, and possibly 2 days of Vancomycin use for broad spectrum bacteremia coverage. UOP has been adequate and UA remarkable only for yeast infetion. Anti-hypertensive medication doses have been reduced by 50% per Nephrology to prevent renal hypoperfusion as she likely has poorly controlled BP chronically.  -Discontinue Hydralazine  -Strict in and outs  -Hold Lasix and ACEi   Hepatic Encephalopathy: Resolved. She arrived lethargic which was concerning for acute hepatic encephalopathy. Ammonia level on admission was 127 and increased to 161, but she is back to her baseline mental status today.  -Continue home lactulose PO PRN titrated to 3BM/day  -Diet heart healthy/ Carb mod only if fully alert   Ascites: Soft and obese abdomen, only mildly distended and not tense. Mild RUQ pain initially but [place] Abd was unremarkable for biliary obstruction or disease. CT/Abd pelvis with no acute intraabdominal process. Not concerned for SBP at this time. Bilirubin remains elevated at .  -Will consider diagnostic/therapeutic tap of ascitic fluid if she declines  -CMP in am   History of seizure: She was on Dilantin at home which can be sedating. Her level was supratherapeutic at 25. Dilantin has been discontinued. Had pseudoseizures on eve of 8/12 and 8/13 with voluntary jerking movements, following commands, with no confusion. No seizure activity so far.  -Continue vimpat  -Continue Keppra   Hypokalemia: resolved. K is 3.6 today. Will continue monitoring and provide gentle repletion given her CKD4 if K is at or <3.0.   Hepatitis C: Genotype in  December 2014. Genotype 1b, viral load of [id number], Log of 6.63.  -She will f/u with Dr. Drue Second in ID for possible treatment of Hep C   HIV disease: Well controlled with viral load undectable in 09/08/13. CD4 count in April at 280. Will recheck labs as dilantin level has been super therapeutic and can interact with antiretroviral. -Repeat CD4  -Recheck viral load  -Continue home abacavir, prezista, norvir, and epivir  -Continue acyclovir for prophylaxis   HTN: Normal to low. She also has had ANA positive with titers that increased from 1:40 to 1:80 after use of hydralazine. After discussing these findings with Dr. Arlean Hopping in Nephrology will discontinue hydralazine for now.   -Reduce home clonidine 0.2mg  BID -Discontinue hydralazine 50mg  TID -Resume labetalol at increased dose to 300mg  BID  -Hold lisinopril 40mg  daily in setting of AKI  -Hold Lasix for now   Vaginal candidiasis: She complains of white vaginal discharge and itching. Her UA is remarkable for many  yeast. This is likely 2/2 to candidal infection.  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 Secondary to Hepatitis C infection. 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 Platelets and INR stable.   DMII: Last Hg A1C 7.5% on 11/13/13. On Lantus 30 u qHS and Novolog 10-25 u actid at home.  -Lantus 20 units qHS  -Sensitive SSI   DVT prophylaxis: SCDs   Diet:  HH, Carb mod   Dispo: Disposition is deferred at this time, awaiting improvement of current medical problems. Anticipated discharge in approximately 2-3 day(s). She needs SNF placement while on Abx tx for 4 weeks as her sister will not be able to manage the central PICC line and Abx.    The patient does have a current PCP Christen Bame, MD) and does need an [facility] follow-up appointment after discharge.  The patient does have transportation limitations that hinder transportation to clinic  appointments.  .Services Needed at time of discharge: Y = Yes, Blank = No PT:   OT:   RN:   Equipment:   Other:     LOS: 10 days   Ky Barban, MD 01/13/2014, 9:35 AM

## 2014-01-13 NOTE — Progress Notes (Signed)
  Date: 01/13/2014  Patient name: Michelle [name]  Medical record number: [id number]  Date of birth: [birth year]   This patient has been seen and the plan of care was discussed with the house staff. Please see Dr Jorje Guild note for complete details. I concur with their findings.  Burns Spain, MD 01/13/2014, 12:52 PM \

## 2014-01-13 NOTE — Progress Notes (Signed)
3 Days Post-Op  Subjective: Infected PAC removal 8/11 RN has been pulling back Iodoform gauze packing 1 inch per day Site better daily  Objective: Vital signs in last 24 hours: Temp:  [98.3 F (36.8 C)-100.3 F (37.9 C)] 100.3 F (37.9 C) (08/17 0519) Pulse Rate:  [60-81] 81 (08/17 0519) Resp:  [18] 18 (08/17 0519) BP: (111-142)/(62-75) 142/67 mmHg (08/17 0519) SpO2:  [97 %-100 %] 97 % (08/17 0519) Weight:  [81.6 kg (179 lb 14.3 oz)] 81.6 kg (179 lb 14.3 oz) (08/17 0519) Last BM Date: 01/13/14  Intake/Output from previous day: 08/16 0701 - 08/17 0700 In: 100 [IV Piggyback:100] Out: 400 [Urine:400] Intake/Output this shift:    PE:  T: 100.3 Wbc wnl Site clean and dry Packing remains; NT No bleeding; no redness  Lab Results:   Recent Labs  01/12/14 0850 01/13/14 0539  WBC 7.6 8.4  HGB 7.0* 6.7*  HCT 21.3* 19.8*  PLT 202 229   BMET  Recent Labs  01/12/14 0643 01/13/14 0539  NA 136* 134*  K 3.8 3.6*  CL 107 103  CO2 13* 14*  GLUCOSE 221* 212*  BUN 55* 57*  CREATININE 3.19* 3.37*  CALCIUM 7.8* 7.9*   PT/INR  Recent Labs  01/12/14 0850  LABPROT 12.7  INR 0.95   ABG No results found for this basename: PHART, PCO2, PO2, HCO3,  in the last 72 hours  Studies/Results: No results found.  Anti-infectives: Anti-infectives   Start     Dose/Rate Route Frequency Ordered Stop   01/13/14 0945  fluconazole (DIFLUCAN) tablet 150 mg     150 mg Oral  Once 01/13/14 0933     01/09/14 0630  vancomycin (VANCOCIN) IVPB 750 mg/150 ml premix  Status:  Discontinued     750 mg 150 mL/hr over 60 Minutes Intravenous Every 48 hours 01/07/14 1418 01/08/14 1433   01/08/14 1000  lamiVUDine (EPIVIR) 10 MG/ML solution 50 mg     50 mg Oral Daily 01/07/14 1517     01/07/14 0600  vancomycin (VANCOCIN) IVPB 750 mg/150 ml premix  Status:  Discontinued     750 mg 150 mL/hr over 60 Minutes Intravenous Every 24 hours 01/06/14 0701 01/07/14 1418   01/06/14 1600  ceFAZolin (ANCEF)  IVPB 2 g/50 mL premix     2 g 100 mL/hr over 30 Minutes Intravenous Every 12 hours 01/06/14 1518     01/06/14 1500  lamiVUDine (EPIVIR) 10 MG/ML solution 100 mg  Status:  Discontinued     100 mg Oral Daily 01/06/14 1402 01/07/14 1517   01/06/14 0800  vancomycin (VANCOCIN) 1,250 mg in sodium chloride 0.9 % 250 mL IVPB     1,250 mg 166.7 mL/hr over 90 Minutes Intravenous  Once 01/06/14 0701 01/06/14 1156   01/04/14 1000  lamiVUDine (EPIVIR) tablet 150 mg  Status:  Discontinued     150 mg Oral Daily 01/04/14 0414 01/06/14 1401   01/04/14 1000  abacavir (ZIAGEN) tablet 300 mg     300 mg Oral 2 times daily 01/04/14 0414     01/04/14 1000  acyclovir (ZOVIRAX) tablet 800 mg     800 mg Oral Daily 01/04/14 0414     01/04/14 0800  Darunavir Ethanolate (PREZISTA) tablet 800 mg     800 mg Oral Daily with breakfast 01/04/14 0414     01/04/14 0800  ritonavir (NORVIR) capsule 100 mg     100 mg Oral Daily with breakfast 01/04/14 0414        Assessment/Plan: s/p  RT chest PAC removal secondary infection 8/11 Doing well Healing nicely Cont to pull iodoform gauze packing from site- 1 inch per day Until all out Re apply new sterile dressing each time We will follow   LOS: 10 days    Carrie Usery A 01/13/2014

## 2014-01-13 NOTE — Progress Notes (Signed)
Subjective: Michelle [name] had no acute events overnight. She had a tmax of 100.3 and no seizure-like activity. She continues to complain of chest wall tenderness, which is also tender to palpation. She further endorses being short of breath while sitting, but states she walks around the room without difficulty. Complains of vaginal itching.  Has been eating and drinking well. Does not complain of diarrhea or constipation.   Objective: Vital signs in last 24 hours: Filed Vitals:   01/12/14 1057 01/12/14 1312 01/12/14 2121 01/13/14 0519  BP: 111/62 115/68 133/75 142/67  Pulse: 60  71 81  Temp:   98.3 F (36.8 C) 100.3 F (37.9 C)  TempSrc:   Oral Oral  Resp:   18 18  Height:      Weight:    81.6 kg (179 lb 14.3 oz)  SpO2:   100% 97%   Weight change: -2.2 kg (-4 lb 13.6 oz)  Intake/Output Summary (Last 24 hours) at 01/13/14 0856 Last data filed at 01/13/14 0525  Gross per 24 hour  Intake    100 ml  Output    400 ml  Net   -300 ml   Physical Exam: Gen; Sitting up in bed in NAD eating breakfast, follows commands appropriately  HEENT: scleral icterus present, MMM, no lower incisors CV: RRR, no murmurs, rubs, or gallops Chest: Resolving ecchymosis on left chest wall, non-erythematous and non-edematous. Port-a-cath removal site on right chest wall non-erythematous.  Resp: CTAB, normal respiratory effort, no wheezing  Abd: soft, mildly distended, non-tender, bowel sounds present, no caput medusa Ext: no peripheral edema, dry skin Neuro: AAOx3, no slurred speech, no asterixis Psych: brief response latency  Lab Results: Basic Metabolic Panel:  Recent Labs Lab 01/08/14 2340  01/12/14 0643 01/13/14 0539  NA 136*  < > 136* 134*  K 3.5*  < > 3.8 3.6*  CL 103  < > 107 103  CO2 16*  < > 13* 14*  GLUCOSE 256*  < > 221* 212*  BUN 49*  < > 55* 57*  CREATININE 2.77*  < > 3.19* 3.37*  CALCIUM 7.2*  < > 7.8* 7.9*  MG 1.7  --   --   --   PHOS 3.2  --   --   --   < > = values in this  interval not displayed. Liver Function Tests:  Recent Labs Lab 01/12/14 0643 01/13/14 0539  AST 58* 87*  ALT <5 5  ALKPHOS 574* 704*  BILITOT 3.6* 4.3*  PROT 5.3* 5.7*  ALBUMIN 1.3* 1.4*   CBC:  Recent Labs Lab 01/09/14 1015 01/10/14 0540  01/12/14 0850 01/13/14 0539  WBC 8.6 7.7  < > 7.6 8.4  NEUTROABS 6.8 5.6  --   --   --   HGB 7.0* 6.8*  < > 7.0* 6.7*  HCT 20.9* 20.4*  < > 21.3* 19.8*  MCV 105.0* 107.9*  < > 108.7* 104.2*  PLT 125* 138*  < > 202 229  < > = values in this interval not displayed. Cardiac Enzymes:  Recent Labs Lab 01/08/14 2340 01/09/14 0612 01/09/14 1153  CKTOTAL  --  59  --   TROPONINI 0.34* <0.30 <0.30   CBG:  Recent Labs Lab 01/11/14 2138 01/12/14 0920 01/12/14 1137 01/12/14 1723 01/12/14 2146 01/13/14 0758  GLUCAP 131* 277* 264* 209* 215* 252*   Coagulation:  Recent Labs Lab 01/08/14 0925 01/12/14 0850  LABPROT 12.0 12.7  INR 0.89 0.95   Anemia Panel:  Recent Labs Lab  01/11/14 1057  RETICCTPCT 1.2   Urine Drug Screen: Drugs of Abuse     Component Value Date/Time   LABOPIA NONE DETECTED 01/03/2014 2311   LABOPIA NEG 11/13/2013 1516   COCAINSCRNUR NONE DETECTED 01/03/2014 2311   COCAINSCRNUR NEG 11/13/2013 1516   LABBENZ NONE DETECTED 01/03/2014 2311   LABBENZ NEG 11/13/2013 1516   AMPHETMU NONE DETECTED 01/03/2014 2311   THCU NONE DETECTED 01/03/2014 2311   LABBARB NONE DETECTED 01/03/2014 2311   LABBARB NEG 11/13/2013 1516    Urinalysis:  Recent Labs Lab 01/06/14 1141 01/11/14 1627  COLORURINE ORANGE* AMBER*  LABSPEC 1.021 1.019  PHURINE 5.5 5.5  GLUCOSEU 100* 100*  HGBUR NEGATIVE NEGATIVE  BILIRUBINUR LARGE* LARGE*  KETONESUR NEGATIVE 15*  PROTEINUR >300* >300*  UROBILINOGEN 1.0 0.2  NITRITE NEGATIVE NEGATIVE  LEUKOCYTESUR TRACE* SMALL*   Micro Results: Recent Results (from the past 240 hour(s))  MRSA PCR SCREENING     Status: Abnormal   Collection Time    01/04/14  4:35 AM      Result Value Ref Range  Status   MRSA by PCR POSITIVE (*) NEGATIVE Final   Comment:            The GeneXpert MRSA Assay (FDA     approved for NASAL specimens     only), is one component of a     comprehensive MRSA colonization     surveillance program. It is not     intended to diagnose MRSA     infection nor to guide or     monitor treatment for     MRSA infections.     RESULT CALLED TO, READ BACK BY AND VERIFIED WITH:     R.ZELLNER,RN 1610 01/04/14 M.CAMPBELL  CULTURE, BLOOD (ROUTINE X 2)     Status: None   Collection Time    01/05/14  3:30 PM      Result Value Ref Range Status   Specimen Description BLOOD LEFT ARM   Final   Special Requests     Final   Value: BOTTLES DRAWN AEROBIC AND ANAEROBIC 10CC BLUE, 5CC RED   Culture  Setup Time     Final   Value: 01/05/2014 22:48     Performed at [facility]   Culture     Final   Value: STAPHYLOCOCCUS AUREUS     Note: RIFAMPIN AND GENTAMICIN SHOULD NOT BE USED AS SINGLE DRUGS FOR TREATMENT OF STAPH INFECTIONS.     Note: Gram Stain Report Called to,Read Back By and Verified With: Volanda Napoleon RN on 01/06/14 at 06:45 by Christie Nottingham     Performed at [facility]   Report Status 01/08/2014 FINAL   Final   Organism ID, Bacteria STAPHYLOCOCCUS AUREUS   Final  CULTURE, BLOOD (ROUTINE X 2)     Status: None   Collection Time    01/05/14  3:42 PM      Result Value Ref Range Status   Specimen Description BLOOD BLOOD LEFT FOREARM   Final   Special Requests BOTTLES DRAWN AEROBIC ONLY 5CC   Final   Culture  Setup Time     Final   Value: 01/05/2014 22:48     Performed at [facility]   Culture     Final   Value: STAPHYLOCOCCUS AUREUS     Note: SUSCEPTIBILITIES PERFORMED ON PREVIOUS CULTURE WITHIN THE LAST 5 DAYS.     Note: Gram Stain Report Called to,Read Back By and Verified With: Volanda Napoleon RN  on 01/06/14 at 06:45 by Christie Nottingham     Performed at [facility]   Report Status 01/08/2014 FINAL   Final  CULTURE, BLOOD (ROUTINE X 2)      Status: None   Collection Time    01/06/14  3:35 PM      Result Value Ref Range Status   Specimen Description BLOOD LEFT ANTECUBITAL   Final   Special Requests     Final   Value: BOTTLES DRAWN AEROBIC AND ANAEROBIC 10CC AER 5CC ANA   Culture  Setup Time     Final   Value: 01/06/2014 19:02     Performed at [facility]   Culture     Final   Value: STAPHYLOCOCCUS AUREUS     Note: RIFAMPIN AND GENTAMICIN SHOULD NOT BE USED AS SINGLE DRUGS FOR TREATMENT OF STAPH INFECTIONS.     Note: Gram Stain Report Called to,Read Back By and Verified With: Junius Argyle 01/08/14 AT 0100 RIDK     Performed at [facility]   Report Status 01/10/2014 FINAL   Final   Organism ID, Bacteria STAPHYLOCOCCUS AUREUS   Final  CULTURE, BLOOD (ROUTINE X 2)     Status: None   Collection Time    01/06/14  3:45 PM      Result Value Ref Range Status   Specimen Description BLOOD LEFT HAND   Final   Special Requests     Final   Value: BOTTLES DRAWN AEROBIC AND ANAEROBIC 10CC AER 5CC ANA   Culture  Setup Time     Final   Value: 01/06/2014 19:00     Performed at [facility]   Culture     Final   Value:        BLOOD CULTURE RECEIVED NO GROWTH TO DATE CULTURE WILL BE HELD FOR 5 DAYS BEFORE ISSUING A FINAL NEGATIVE REPORT     Performed at [facility]   Report Status PENDING   Incomplete  CULTURE, BLOOD (ROUTINE X 2)     Status: None   Collection Time    01/08/14  6:56 AM      Result Value Ref Range Status   Specimen Description BLOOD LEFT HAND   Final   Special Requests BOTTLES DRAWN AEROBIC AND ANAEROBIC 10CC   Final   Culture  Setup Time     Final   Value: 01/08/2014 10:12     Performed at [facility]   Culture     Final   Value:        BLOOD CULTURE RECEIVED NO GROWTH TO DATE CULTURE WILL BE HELD FOR 5 DAYS BEFORE ISSUING A FINAL NEGATIVE REPORT     Performed at [facility]   Report Status PENDING   Incomplete  CULTURE, BLOOD (ROUTINE X 2)     Status: None     Collection Time    01/08/14  7:05 AM      Result Value Ref Range Status   Specimen Description BLOOD BLOOD LEFT FOREARM   Final   Special Requests BOTTLES DRAWN AEROBIC ONLY [facility]   Final   Culture  Setup Time     Final   Value: 01/08/2014 10:12     Performed at [facility]   Culture     Final   Value:        BLOOD CULTURE RECEIVED NO GROWTH TO DATE CULTURE WILL BE HELD FOR 5 DAYS BEFORE ISSUING A FINAL NEGATIVE REPORT  Performed at [facility]   Report Status PENDING   Incomplete  CULTURE, BLOOD (ROUTINE X 2)     Status: None   Collection Time    01/10/14 12:47 AM      Result Value Ref Range Status   Specimen Description BLOOD LEFT FOREARM   Final   Special Requests     Final   Value: BOTTLES DRAWN AEROBIC AND ANAEROBIC BLUE 10 CC RED 5 CC   Culture  Setup Time     Final   Value: 01/11/2014 13:10     Performed at [facility]   Culture     Final   Value:        BLOOD CULTURE RECEIVED NO GROWTH TO DATE CULTURE WILL BE HELD FOR 5 DAYS BEFORE ISSUING A FINAL NEGATIVE REPORT     Performed at [facility]   Report Status PENDING   Incomplete  CULTURE, BLOOD (ROUTINE X 2)     Status: None   Collection Time    01/10/14 12:53 AM      Result Value Ref Range Status   Specimen Description BLOOD LEFT HAND   Final   Special Requests BOTTLES DRAWN AEROBIC AND ANAEROBIC 10 CC   Final   Culture  Setup Time     Final   Value: 01/11/2014 13:10     Performed at [facility]   Culture     Final   Value:        BLOOD CULTURE RECEIVED NO GROWTH TO DATE CULTURE WILL BE HELD FOR 5 DAYS BEFORE ISSUING A FINAL NEGATIVE REPORT     Performed at [facility]   Report Status PENDING   Incomplete   Studies/Results: No results found. Medications:  Scheduled Meds: . sodium chloride   Intravenous Once  . abacavir  300 mg Oral BID  . acyclovir  800 mg Oral Daily  . aspirin EC  81 mg Oral Daily  .  ceFAZolin (ANCEF) IV  2 g Intravenous Q12H  .  cloNIDine  0.2 mg Oral BID  . darbepoetin (ARANESP) injection - NON-DIALYSIS  40 mcg Subcutaneous Q Sat-1800  . Darunavir Ethanolate  800 mg Oral Q breakfast  . enoxaparin (LOVENOX) injection  30 mg Subcutaneous Q24H  . hydrocerin   Topical BID  . insulin aspart  0-9 Units Subcutaneous TID WC  . insulin glargine  20 Units Subcutaneous QHS  . labetalol  300 mg Oral BID  . lacosamide  150 mg Oral BID  . lactulose  20 g Oral TID  . lamiVUDine  50 mg Oral Daily  . levETIRAcetam  500 mg Oral BID  . magic mouthwash w/lidocaine  5 mL Oral TID  . ritonavir  100 mg Oral Q breakfast  . sodium bicarbonate  650 mg Oral BID  . sodium chloride  3 mL Intravenous Q12H   Continuous Infusions:  PRN Meds:.acetaminophen, diphenhydrAMINE-zinc acetate, hydrOXYzine, menthol-cetylpyridinium, ondansetron (ZOFRAN) IV, ondansetron Assessment/Plan: Principal Problem:   Sepsis due to Staphylococcus aureus Active Problems:   HIV disease   CKD (chronic kidney disease) stage 4, GFR 15-29 ml/min   Diabetes   Anemia of chronic disease   HCV (hepatitis C virus)   Metabolic acidosis, increased anion gap   Altered mental state   Hepatic encephalopathy   Ascites   Fever, unspecified  Assessment:  Michelle [name] is a 53 year old woman with a PMH significant for HIV and 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  She is now on antibiotic day 8.   Sepsis due to MSSA: Michelle [name] does not appear septic and her tmax over the past 72 hours has been 100.30F orally last night. Her mental status is unchanged and at baseline. On 8/9, when the patient had a temperature of 102.9, blood cultures were drawn and grew MSSA in 2/2 vials. The source of her bacteremia was likely her port-a-cath placed on 12/05/13 which was removed on  8/11. She has been afebrile since 8/13. SBP is less likely given minimum ascites and no abdominal pain. No signs of endocarditis on physical exam and her TEE showed no endocarditis. MRI lumbar/thoracic spine negative for epidural abscess. CT abd/pelvis negative for intraabdominal process,  -Will continue cefazolin (now day 7) for 4 weeks total -Central PICC line placing by IR, as she has been afebrile for >48 hours. They have her scheduled for 8/18 AM. -Continuing to monitor for s/s of SBP   Acute on chronic Anemia: Baseline Hg of 6-8. Her HgB today is 6.7. She denies hemoptysis, hematuria, hematochezia, or melena. 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 Dr. Cyndie ChimeGranfortuna in Hematology/Concology recommends pRBC transfusion only if she is symptomatic.  -Will continue to receive Aranesp 40mcg weekly (her last dose was on Saturday, 8/15) per Dr. Arlean HoppingSchertz in Nephrology   Acute on Chronic Renal Failure : Cr still elevated but stable at 3.3 today. Baseline Cr in the 2.4-2.8 range. This is likely multifactorial from furosemide use, ACEi use, and possibly 2 days of Vancomycin use for broad spectrum bacteremia coverage. Will discontinue foley today. Anti-hypertensive medication doses were reduced as lowering blood pressure may be contributing since she likely has poorly controlled BP chronically. Hydralazine 25mg  q8h has been discontinued as it appears that it may interact with ANA positive titers. -Clonidine 0.2mg  bid, and labetalol 300mg  bid -Strict in and outs  -Holding Lasix and ACEi   HTN: Normotensive. HTN control may be resulting in poor renal function, as she is likely has longstanding poorly controlled blood pressures. Her medications were reduced yesterday. She has had ANA positive with titers that increased from 1:40 to 1:80 after use of hydralazine. After discussing these  findings with Dr. Arlean HoppingSchertz in Nephrology hydralazine was discontinued and ANA recheck titers in 1 month with her PCP follow up. She may need Norvasc if BP trends up again.  -continue clonidine 0.2mg  BID,   -continue labetalol 300mg  BID,  -Holding lisinopril 40mg  daily in setting of AKI  -Holding Lasix for now  Yeast infection: She complains of vaginal pruritus and U/A shows many yeast.  -Will give fluconazole 150mg  today and will give sequential dose in 72 hours  Hepatic Encephalopathy: Resolved. She arrived lethargic which was concerning for acute hepatic encephalopathy, but she is back to her baseline mental status.  -Continue home lactulose PO PRN titrated to 3BM/day  -Diet heart healthy/ Carb mod only if fully alert   Ascites: Soft and obese abdomen, only mildly distended and not tense. CT abdomen/pelvis on presentation with only mild ascities, no acute intraabdominal process. Not concerned for SBP at this time. Bilirubin at 4.3 today, likely due to cholestasis. (U/S from 8/12 did not show an gallstones  or gallbladder process) -Will consider diagnostic/therapeutic tap of ascitic fluid if she declines  -CMP in am   History of seizure: Stable. Dilantin was discontinued on admission due to supertherapeutic level of 25 and we will continue to hold it due to renal function. She had pseudoseizures on 8/12 and 8/13 with voluntary jerking movements while following commands and remaining fully conversant. No confusion, tongue biting, or incontinence during either episode.  -Continue vimpat  -Continue Keppra   Hypokalemia: resolved. K is 3.6 today. Will continue monitoring and provide gentle repletion given her CKD4 if K is at or <3.0.   Hepatitis C: Genotype in December 2014. Genotype 1b, viral load of [id number], Log of 6.63.  -She will f/u with Dr. Drue Second in ID for possible treatment of Hep C   HIV disease: Well controlled with viral load undectable in 09/08/13. CD4 count in April at 280.  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 Secondary to Hepatitis C infection. 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 Platelets and INR stable.   DMII: Last Hg A1C 7.5% on 11/13/13. On Lantus 30 u qHS and Novolog 10-25 u actid at home.  -Lantus 20 units qHS  -Sensitive SSI   DVT prophylaxis: SCDs   Diet:  HH, Carb mod   Dispo: Awaiting SNF placement and central PICC placement. Patient will likely be discharged tomorrow.   This is a Psychologist, occupational Note.  The care of the patient was discussed with Dr. Garald Braver and the assessment and plan formulated with their assistance.  Please see their attached note for official documentation of the daily encounter.   LOS: 10 days   Lillia Carmel, Med Student 01/13/2014, 8:56 AM

## 2014-01-13 NOTE — Progress Notes (Signed)
  I have seen and examined the patient, and reviewed the daily progress note by Lillia Carmel, MS 4 and discussed the care of the patient with them. Please see my progress note from 01/13/2014 for further details regarding assessment and plan.    Signed:  Ky Barban, MD 01/13/2014, 10:15 AM

## 2014-01-13 NOTE — Progress Notes (Signed)
S:Sister at bedside.  Michelle Michelle [name] reports she is feeling Michelle Michelle [name] better but has some occasional lower back and abdominal pain.  Sister reports she occasionally complains of itching O:BP 142/67  Pulse 81  Temp(Src) 100.3 F (37.9 Michelle [name]) (Oral)  Resp 18  Ht 5\' 5"  (1.651 m)  Wt 179 lb 14.3 oz (81.6 kg)  BMI 29.94 kg/m2  SpO2 97%  Intake/Output Summary (Last 24 hours) at 01/13/14 1106 Last data filed at 01/13/14 0959  Gross per 24 hour  Intake    220 ml  Output      0 ml  Net    220 ml   Intake/Output: I/O last 3 completed shifts: In: 463 [P.O.:360; I.V.:3; IV Piggyback:100] Out: 1000 [Urine:1000]  Intake/Output this shift:  Total I/O In: 120 [P.O.:120] Out: -  Weight change: -4 lb 13.6 oz (-2.2 kg) ZOX:WRUEAVW comfortably in bed, NAD UJW:JXBJY porta cath site bandaged Michelle [name]/d/i, RRR no murmur appreciated Resp:CTAB no w/r/r NWG:NFAO, + BS, mild tenderness over LUQ ZHY:QMVHQION healed ulcers over lower extremities, trace edema   Recent Labs Lab 01/08/14 0656 01/08/14 2340 01/09/14 0612 01/10/14 0540 01/11/14 0615 01/12/14 0643 01/13/14 0539  NA 138 136* 140 139 134* 136* 134*  K 3.3* 3.5* 4.0 3.6* 3.8 3.8 3.6*  CL 105 103 108 108 103 107 103  CO2 14* 16* 17* 16* 14* 13* 14*  GLUCOSE 148* 256* 197* 246* 226* 221* 212*  BUN 55* 49* 51* 55* 58* 55* 57*  CREATININE 3.01* 2.77* 2.83* 3.08* 3.28* 3.19* 3.37*  ALBUMIN 1.6* 1.5* 1.5* 1.4* 1.4* 1.3* 1.4*  CALCIUM 7.2* 7.2* 7.2* 7.4* 7.7* 7.8* 7.9*  PHOS  --  3.2  --   --   --   --   --   AST 79* 62* 62* 67* 58* 58* 87*  ALT 26 18 14 8  <5 <5 5   Liver Function Tests:  Recent Labs Lab 01/11/14 0615 01/12/14 0643 01/13/14 0539  AST 58* 58* 87*  ALT <5 <5 5  ALKPHOS 544* 574* 704*  BILITOT 3.5* 3.6* 4.3*  PROT 5.5* 5.3* 5.7*  ALBUMIN 1.4* 1.3* 1.4*   No results found for this basename: LIPASE, AMYLASE,  in the last 168 hours No results found for this basename: AMMONIA,  in the last 168 hours CBC:  Recent Labs Lab  01/08/14 0656  01/09/14 1015 01/10/14 0540 01/11/14 0047 01/12/14 0850 01/13/14 0539  WBC 9.4  < > 8.6 7.7 6.7 7.6 8.4  NEUTROABS 7.3  --  6.8 5.6  --   --   --   HGB 8.0*  < > 7.0* 6.8* 6.6* 7.0* 6.7*  HCT 23.9*  < > 20.9* 20.4* 20.1* 21.3* 19.8*  MCV 108.1*  < > 105.0* 107.9* 107.5* 108.7* 104.2*  PLT 140*  < > 125* 138* 155 202 229  < > = values in this interval not displayed. Cardiac Enzymes:  Recent Labs Lab 01/08/14 2340 01/09/14 0612 01/09/14 1153  CKTOTAL  --  59  --   TROPONINI 0.34* <0.30 <0.30   CBG:  Recent Labs Lab 01/12/14 0920 01/12/14 1137 01/12/14 1723 01/12/14 2146 01/13/14 0758  GLUCAP 277* 264* 209* 215* 252*    Iron Studies: No results found for this basename: IRON, TIBC, TRANSFERRIN, FERRITIN,  in the last 72 hours Studies/Results: No results found. . sodium chloride   Intravenous Once  . abacavir  300 mg Oral BID  . acyclovir  800 mg Oral Daily  . aspirin EC  81 mg Oral  Daily  .  ceFAZolin (ANCEF) IV  1 g Intravenous Q12H  . cloNIDine  0.2 mg Oral BID  . darbepoetin (ARANESP) injection - NON-DIALYSIS  40 mcg Subcutaneous Q Sat-1800  . Darunavir Ethanolate  800 mg Oral Q breakfast  . enoxaparin (LOVENOX) injection  30 mg Subcutaneous Q24H  . fluconazole  150 mg Oral Once  . hydrocerin   Topical BID  . insulin aspart  0-9 Units Subcutaneous TID WC  . insulin glargine  20 Units Subcutaneous QHS  . labetalol  300 mg Oral BID  . lacosamide  150 mg Oral BID  . lactulose  20 g Oral TID  . lamiVUDine  50 mg Oral Daily  . levETIRAcetam  500 mg Oral BID  . magic mouthwash w/lidocaine  5 mL Oral TID  . ritonavir  100 mg Oral Q breakfast  . sodium bicarbonate  650 mg Oral BID  . sodium chloride  3 mL Intravenous Q12H    BMET    Component Value Date/Time   NA 134* 01/13/2014 0539   K 3.6* 01/13/2014 0539   CL 103 01/13/2014 0539   CO2 14* 01/13/2014 0539   GLUCOSE 212* 01/13/2014 0539   BUN 57* 01/13/2014 0539   CREATININE 3.37* 01/13/2014  0539   CREATININE 1.85* 10/04/2013 1440   CALCIUM 7.9* 01/13/2014 0539   GFRNONAA 14* 01/13/2014 0539   GFRNONAA 27* 08/19/2013 1618   GFRAA 17* 01/13/2014 0539   GFRAA 31* 08/19/2013 1618   CBC    Component Value Date/Time   WBC 8.4 01/13/2014 0539   WBC 6.0 09/25/2013 1353   RBC 1.90* 01/13/2014 0539   RBC 1.76* 01/11/2014 1057   RBC 2.21* 09/25/2013 1353   HGB 6.7* 01/13/2014 0539   HGB 7.9* 09/25/2013 1353   HCT 19.8* 01/13/2014 0539   HCT 23.9* 09/25/2013 1353   PLT 229 01/13/2014 0539   PLT 164 09/25/2013 1353   MCV 104.2* 01/13/2014 0539   MCV 108.1* 09/25/2013 1353   MCH 35.3* 01/13/2014 0539   MCH 35.7* 09/25/2013 1353   MCHC 33.8 01/13/2014 0539   MCHC 33.1 09/25/2013 1353   RDW 14.2 01/13/2014 0539   RDW 14.8* 09/25/2013 1353   LYMPHSABS 1.1 01/10/2014 0540   LYMPHSABS 0.6* 09/25/2013 1353   MONOABS 0.9 01/10/2014 0540   MONOABS 0.3 09/25/2013 1353   EOSABS 0.0 01/10/2014 0540   EOSABS 0.0 09/25/2013 1353   BASOSABS 0.0 01/10/2014 0540   BASOSABS 0.0 09/25/2013 1353   Abd U/S 01/08/14: Right kidney 10.1 cm, increased echotexture no hydronephrosis, Left kidney 10.9cm increased echotexture no hydronethroneprosis  Ct abd/pelvis 01/10/14: Trace free fluid in pelvis, increaing bibasilar densities, no acute findings  U/Michelle [name] 8/15: Large bilirubin, 15 ketones, neg nitrite, small leuk, many yeast, granular casts UNa <20 U Cr 107  Assessment/Plan:  1. 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  As well as cirrhosis from Hep Michelle [name] however does not fit clinical picture of HRS.  Her baseline Cr is 2-2.5.  Her Cr is currently trending up to 3.3 today.  Her BP meds have been decreased by 50% to improve renal perfusion (BP currently 142/67).  Hydralazine has also been discontinued as this is suspected to have increased her ANA titer, anti histone Ab pending.  Continue to monitor renal  function. 2. MSSA sepsis: plan for 4 weeks of IV Ancef 3. HIV: well controlled 4.  Hep Michelle [name] with Cirrhosis: ID considering outpatient treatment for Hep Michelle [name] 5. IDDM 6. HTN>> Clonidine 0.2mg  BID, Labetalol 300mg  BID 7. Anemia in CKD: Aranesp every Saturday, Hgb 6.7 today 8. Seizures>> Keppra  Michelle Michelle [name], Michelle Michelle [name] Internal Medicine PGY-2 Pager [phone]  I have seen and examined this patient and agree with plan as outlined by Dr. Mikey BussingHoffman.  Unfortunately Michelle Michelle [name] has missed her most recent appointment with Dr. Marisue HumbleSanford (scheduled 12/26/13) and has been sporadic with followup.  Her Scr has been climbing over the last several days and is now Michelle [name]/o watery diarrhea.  Likely Michelle [name] prerenal component but also has been receiving 2 grams of Ancef daily and would recommend dropping to 1gram daily given her drop in GFR to 14.  Will add bicarb IV given diarrhea and metabolic acidosis and follow Scr and electrolytes.  Pt will also need to be evaluated by [facility] for access placement once infection has resolved as well as have SW assistance with transportation so she can keep up with outpt appointments. Michelle Paganelli A,MD 01/13/2014 1:04 PM

## 2014-01-14 ENCOUNTER — Inpatient Hospital Stay (HOSPITAL_COMMUNITY): Payer: Medicaid Other

## 2014-01-14 LAB — COMPREHENSIVE METABOLIC PANEL
ALBUMIN: 1.4 g/dL — AB (ref 3.5–5.2)
AST: 59 U/L — AB (ref 0–37)
Alkaline Phosphatase: 693 U/L — ABNORMAL HIGH (ref 39–117)
Anion gap: 14 (ref 5–15)
BILIRUBIN TOTAL: 4 mg/dL — AB (ref 0.3–1.2)
BUN: 50 mg/dL — ABNORMAL HIGH (ref 6–23)
CHLORIDE: 103 meq/L (ref 96–112)
CO2: 20 mEq/L (ref 19–32)
CREATININE: 2.97 mg/dL — AB (ref 0.50–1.10)
Calcium: 7.7 mg/dL — ABNORMAL LOW (ref 8.4–10.5)
GFR calc Af Amer: 19 mL/min — ABNORMAL LOW (ref >90)
GFR calc non Af Amer: 17 mL/min — ABNORMAL LOW (ref >90)
Glucose, Bld: 242 mg/dL — ABNORMAL HIGH (ref 70–99)
POTASSIUM: 3.4 meq/L — AB (ref 3.7–5.3)
Sodium: 137 mEq/L (ref 137–147)
Total Protein: 5.8 g/dL — ABNORMAL LOW (ref 6.0–8.3)

## 2014-01-14 LAB — HISTONE ANTIBODIES, IGG, BLOOD: DNA-Histone: <1

## 2014-01-14 LAB — CBC
HCT: 19.1 % — ABNORMAL LOW (ref 36.0–46.0)
Hemoglobin: 6.3 g/dL — CL (ref 12.0–15.0)
MCH: 35.8 pg — ABNORMAL HIGH (ref 26.0–34.0)
MCHC: 33 g/dL (ref 30.0–36.0)
MCV: 108.5 fL — ABNORMAL HIGH (ref 78.0–100.0)
PLATELETS: 215 10*3/uL (ref 150–400)
RBC: 1.76 MIL/uL — ABNORMAL LOW (ref 3.87–5.11)
RDW: 14.6 % (ref 11.5–15.5)
WBC: 8.6 10*3/uL (ref 4.0–10.5)

## 2014-01-14 LAB — GLUCOSE, CAPILLARY
GLUCOSE-CAPILLARY: 258 mg/dL — AB (ref 70–99)
GLUCOSE-CAPILLARY: 275 mg/dL — AB (ref 70–99)
GLUCOSE-CAPILLARY: 279 mg/dL — AB (ref 70–99)
Glucose-Capillary: 188 mg/dL — ABNORMAL HIGH (ref 70–99)
Glucose-Capillary: 285 mg/dL — ABNORMAL HIGH (ref 70–99)

## 2014-01-14 LAB — CULTURE, BLOOD (ROUTINE X 2)
Culture: NO GROWTH
Culture: NO GROWTH

## 2014-01-14 LAB — HIV-1 RNA QUANT-NO REFLEX-BLD
HIV 1 RNA Quant: <20 copies/mL (ref <20)
HIV-1 RNA Quant, Log: <1.3 {Log} (ref <1.30)

## 2014-01-14 LAB — MAGNESIUM: MAGNESIUM: 1.7 mg/dL (ref 1.5–2.5)

## 2014-01-14 MED ORDER — GI COCKTAIL ~~LOC~~: 30.0000 mL | Freq: Once | ORAL | Status: DC

## 2014-01-14 MED ORDER — MIDAZOLAM HCL 2 MG/2ML IJ SOLN
INTRAMUSCULAR | Status: AC
  Filled 2014-01-14: qty 2

## 2014-01-14 MED ORDER — LABETALOL HCL 5 MG/ML IV SOLN
INTRAVENOUS | Status: AC
  Filled 2014-01-14: qty 4

## 2014-01-14 MED ORDER — DARBEPOETIN ALFA-POLYSORBATE 150 MCG/0.3ML IJ SOLN
150.0000 ug | Freq: Once | INTRAMUSCULAR | Status: AC
  Administered 2014-01-14: 150 ug via SUBCUTANEOUS
  Filled 2014-01-14: qty 0.3

## 2014-01-14 MED ORDER — CLONIDINE HCL 0.3 MG PO TABS
0.3000 mg | ORAL_TABLET | Freq: Two times a day (BID) | ORAL | Status: DC
  Administered 2014-01-14 – 2014-01-15 (×2): 0.3 mg via ORAL
  Filled 2014-01-14 (×3): qty 1

## 2014-01-14 MED ORDER — HYDRALAZINE HCL 20 MG/ML IJ SOLN
5.0000 mg | Freq: Once | INTRAMUSCULAR | Status: AC
  Administered 2014-01-14: 5 mg via INTRAVENOUS
  Filled 2014-01-14: qty 1

## 2014-01-14 MED ORDER — AMLODIPINE BESYLATE 5 MG PO TABS: 5.0000 mg | ORAL_TABLET | Freq: Every day | ORAL | Status: DC

## 2014-01-14 MED ORDER — ALUM & MAG HYDROXIDE-SIMETH 200-200-20 MG/5ML PO SUSP
15.0000 mL | Freq: Once | ORAL | Status: AC
  Administered 2014-01-14: 15 mL via ORAL
  Filled 2014-01-14: qty 30

## 2014-01-14 MED ORDER — LABETALOL HCL 5 MG/ML IV SOLN
INTRAVENOUS | Status: AC | PRN
  Administered 2014-01-14: 20 mg via INTRAVENOUS

## 2014-01-14 MED ORDER — CLONIDINE HCL 0.1 MG PO TABS
0.1000 mg | ORAL_TABLET | Freq: Once | ORAL | Status: AC
  Administered 2014-01-14: 0.1 mg via ORAL
  Filled 2014-01-14: qty 1

## 2014-01-14 MED ORDER — FENTANYL CITRATE 0.05 MG/ML IJ SOLN
INTRAMUSCULAR | Status: DC
Start: 2014-01-14 — End: 2014-01-14
  Filled 2014-01-14: qty 2

## 2014-01-14 MED ORDER — LIDOCAINE HCL 1 % IJ SOLN
INTRAMUSCULAR | Status: AC
  Filled 2014-01-14: qty 20

## 2014-01-14 NOTE — Progress Notes (Signed)
EKG completed. Maalox given. Notified MD of pt BP 205/74. MD stated she would place order for 5mg  hydralazine IV.

## 2014-01-14 NOTE — Progress Notes (Signed)
MD notified of pt BP 181/78. NO new orders given.

## 2014-01-14 NOTE — Progress Notes (Signed)
BP =216/83; MD called and new order will be documented.

## 2014-01-14 NOTE — Progress Notes (Signed)
S:Patient reports some vaginal itching, otherwise no complaints.  Per nursing records she had an episode of chest pain overnight which has resolved. O:BP 181/78  Pulse 79  Temp(Src) 98.4 F (36.9 C) (Oral)  Resp 20  Ht 5\' 5"  (1.651 m)  Wt 179 lb 14.3 oz (81.6 kg)  BMI 29.94 kg/m2  SpO2 99%  Intake/Output Summary (Last 24 hours) at 01/14/14 0912 Last data filed at 01/14/14 0030  Gross per 24 hour  Intake    600 ml  Output      0 ml  Net    600 ml   Intake/Output: I/O last 3 completed shifts: In: 650 [P.O.:600; IV Piggyback:50] Out: -   Intake/Output this shift:    Weight change: 0 lb (0 kg) ZOX:WRUEAVWGen:sitting in chair, NAD UJW:JXBJYCVS:rigth chest bandage c/d/i, RRR no murmur appreciated Resp:CTAB no w/r/r NWG:NFAOAbd:soft, + BS, non tender, non distended ZHY:QMVHQIONExt:multiple healed ulcers over lower extremities, trace edema   Recent Labs Lab 01/08/14 2340 01/09/14 0612 01/10/14 0540 01/11/14 0615 01/12/14 0643 01/13/14 0539 01/14/14 0545  NA 136* 140 139 134* 136* 134* 137  K 3.5* 4.0 3.6* 3.8 3.8 3.6* 3.4*  CL 103 108 108 103 107 103 103  CO2 16* 17* 16* 14* 13* 14* 20  GLUCOSE 256* 197* 246* 226* 221* 212* 242*  BUN 49* 51* 55* 58* 55* 57* 50*  CREATININE 2.77* 2.83* 3.08* 3.28* 3.19* 3.37* 2.97*  ALBUMIN 1.5* 1.5* 1.4* 1.4* 1.3* 1.4* 1.4*  CALCIUM 7.2* 7.2* 7.4* 7.7* 7.8* 7.9* 7.7*  PHOS 3.2  --   --   --   --   --   --   AST 62* 62* 67* 58* 58* 87* 59*  ALT 18 14 8  <5 <5 5 <5   Liver Function Tests:  Recent Labs Lab 01/12/14 0643 01/13/14 0539 01/14/14 0545  AST 58* 87* 59*  ALT <5 5 <5  ALKPHOS 574* 704* 693*  BILITOT 3.6* 4.3* 4.0*  PROT 5.3* 5.7* 5.8*  ALBUMIN 1.3* 1.4* 1.4*   No results found for this basename: LIPASE, AMYLASE,  in the last 168 hours No results found for this basename: AMMONIA,  in the last 168 hours CBC:  Recent Labs Lab 01/08/14 0656  01/09/14 1015 01/10/14 0540 01/11/14 0047 01/12/14 0850 01/13/14 0539 01/14/14 0545  WBC 9.4  < > 8.6 7.7  6.7 7.6 8.4 8.6  NEUTROABS 7.3  --  6.8 5.6  --   --   --   --   HGB 8.0*  < > 7.0* 6.8* 6.6* 7.0* 6.7* 6.3*  HCT 23.9*  < > 20.9* 20.4* 20.1* 21.3* 19.8* 19.1*  MCV 108.1*  < > 105.0* 107.9* 107.5* 108.7* 104.2* 108.5*  PLT 140*  < > 125* 138* 155 202 229 215  < > = values in this interval not displayed. Cardiac Enzymes:  Recent Labs Lab 01/08/14 2340 01/09/14 0612 01/09/14 1153  CKTOTAL  --  59  --   TROPONINI 0.34* <0.30 <0.30   CBG:  Recent Labs Lab 01/13/14 1136 01/13/14 1731 01/13/14 2135 01/14/14 0020 01/14/14 0742  GLUCAP 276* 265* 275* 279* 258*    Iron Studies: No results found for this basename: IRON, TIBC, TRANSFERRIN, FERRITIN,  in the last 72 hours Studies/Results: No results found. . sodium chloride   Intravenous Once  . abacavir  300 mg Oral BID  . acyclovir  800 mg Oral Daily  . aspirin EC  81 mg Oral Daily  .  ceFAZolin (ANCEF) IV  1  g Intravenous Q12H  . cloNIDine  0.2 mg Oral BID  . darbepoetin (ARANESP) injection - NON-DIALYSIS  40 mcg Subcutaneous Q Sat-1800  . Darunavir Ethanolate  800 mg Oral Q breakfast  . enoxaparin (LOVENOX) injection  30 mg Subcutaneous Q24H  . hydrocerin   Topical BID  . insulin aspart  0-9 Units Subcutaneous TID WC  . insulin glargine  20 Units Subcutaneous QHS  . labetalol  300 mg Oral BID  . lacosamide  150 mg Oral BID  . lactulose  20 g Oral TID  . lamiVUDine  50 mg Oral Daily  . levETIRAcetam  500 mg Oral BID  . magic mouthwash w/lidocaine  5 mL Oral TID  . ritonavir  100 mg Oral Q breakfast  . sodium bicarbonate  650 mg Oral BID  . sodium chloride  3 mL Intravenous Q12H    BMET    Component Value Date/Time   NA 137 01/14/2014 0545   K 3.4* 01/14/2014 0545   CL 103 01/14/2014 0545   CO2 20 01/14/2014 0545   GLUCOSE 242* 01/14/2014 0545   BUN 50* 01/14/2014 0545   CREATININE 2.97* 01/14/2014 0545   CREATININE 1.85* 10/04/2013 1440   CALCIUM 7.7* 01/14/2014 0545   GFRNONAA 17* 01/14/2014 0545   GFRNONAA 27*  08/19/2013 1618   GFRAA 19* 01/14/2014 0545   GFRAA 31* 08/19/2013 1618   CBC    Component Value Date/Time   WBC 8.6 01/14/2014 0545   WBC 6.0 09/25/2013 1353   RBC 1.76* 01/14/2014 0545   RBC 1.76* 01/11/2014 1057   RBC 2.21* 09/25/2013 1353   HGB 6.3* 01/14/2014 0545   HGB 7.9* 09/25/2013 1353   HCT 19.1* 01/14/2014 0545   HCT 23.9* 09/25/2013 1353   PLT 215 01/14/2014 0545   PLT 164 09/25/2013 1353   MCV 108.5* 01/14/2014 0545   MCV 108.1* 09/25/2013 1353   MCH 35.8* 01/14/2014 0545   MCH 35.7* 09/25/2013 1353   MCHC 33.0 01/14/2014 0545   MCHC 33.1 09/25/2013 1353   RDW 14.6 01/14/2014 0545   RDW 14.8* 09/25/2013 1353   LYMPHSABS 1.1 01/10/2014 0540   LYMPHSABS 0.6* 09/25/2013 1353   MONOABS 0.9 01/10/2014 0540   MONOABS 0.3 09/25/2013 1353   EOSABS 0.0 01/10/2014 0540   EOSABS 0.0 09/25/2013 1353   BASOSABS 0.0 01/10/2014 0540   BASOSABS 0.0 09/25/2013 1353   Abd U/S 01/08/14: Right kidney 10.1 cm, increased echotexture no hydronephrosis, Left kidney 10.9cm increased echotexture no hydronethroneprosis  Ct abd/pelvis 01/10/14: Trace free fluid in pelvis, increaing bibasilar densities, no acute findings  U/A 8/15: Large bilirubin, 15 ketones, neg nitrite, small leuk, many yeast, granular casts UNa <20 U Cr 107  Assessment/Plan:  1. 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  Her baseline Cr is 2-2.5.  Her SCr has tended down slightly overnight to 3.  Her metabolic acidosis has improved with a bicarb of 20.  Her BP medication have been decreased and  Hydralazine has also been discontinued as this is suspected to have increased her ANA titer, anti histone Ab pending.  Her BP is elevated today. 1. D/C IVF 2. Would recommend adjusting BP regimen to increase compliance as much as possible and avoid double alpha blockade.  Would recommend chaning to Catapress patch and/or extended  release Metoprolol to help improve adherence to medications as well as avoid rebound hypertension. 3.  Pt is a poor dialysis candidate given her nonadherence with medical follow up.  Cont to stress the importance of regularly scheduled medical visits and compliance with medications  2. MSSA sepsis: plan for 4 weeks of IV Ancef, to have PICC placed today   (note Ancef decreased to 1g daily due to renal function yesterday but has returned to 1g Q12 today) 3. HIV: well controlled 4. Hep C with Cirrhosis: ID considering outpatient treatment for Hep C 5. 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 HTN>> Clonidine 0.2mg  BID, Labetalol 600mg  BID 7. Anemia in CKD:  Hgb 6.3 today, patient has been seen previously by Dr. Cyndie Chime- Hematology would recommend seeking his input in adjusting Aranesp. 1. Recommend Heme input since her Hgb has cont to drop 8. Seizures>> Keppra 9. Dispo Needs SW assistance with transportation to keep outpatient renal appointments, will need [facility] evaluation for access placement.  [facility] Internal [facility] PGY-2 Pager [phone] I have seen and examined this patient and agree with plan as outlined by Dr. Mikey Bussing. Cady Hafen A,MD 01/14/2014 3:29 PM

## 2014-01-14 NOTE — Progress Notes (Signed)
Subjective: Michelle [name] was hypertensive overnight with a BP of 208/78. She was given clonidine 0.1mg  once and her BP subsequently dropped to 181/78. She also reported having chest pain, which resolved spontaneously. An EKG was ordered and unchanged from previous EKGs. She denied SOB and continues to endorse vaginal "soreness" this morning.  She states she has been ambulating around her room with no difficulties.   Objective: Vital signs in last 24 hours: Filed Vitals:   01/14/14 0639 01/14/14 1025 01/14/14 1039 01/14/14 1049  BP: 181/78 214/87 224/89 217/86  Pulse: 79 66 71 65  Temp: 98.4 F (36.9 C)     TempSrc: Oral     Resp: 20 22 18 19   Height:      Weight: 81.6 kg (179 lb 14.3 oz)     SpO2: 99% 100% 100% 100%   Weight change: 0 kg (0 lb)  Intake/Output Summary (Last 24 hours) at 01/14/14 1142 Last data filed at 01/14/14 0030  Gross per 24 hour  Intake    480 ml  Output      0 ml  Net    480 ml   Physical Exam: Gen; Sitting in bed in NAD HEENT: No scleral icterus, MMM CV: RRR, no murmurs, rubs, or gallops Chest: Peripheral IV to her left chest with ecchymosis that is improving and non-erythematous. Right chest with site of port-a-cath that was removed with no edema/erythema. Resp: CTAB, normal respiratory effort, no wheezing  Abd: soft, mildly distended, non tender, no caput [place] Ext: no peripheral edema, dry skin, no teleangiectasia  Neuro: Alert and oriented x3, no slurred speech  Lab Results: Basic Metabolic Panel:  Recent Labs Lab 01/08/14 2340  01/13/14 0539 01/14/14 0545  NA 136*  < > 134* 137  K 3.5*  < > 3.6* 3.4*  CL 103  < > 103 103  CO2 16*  < > 14* 20  GLUCOSE 256*  < > 212* 242*  BUN 49*  < > 57* 50*  CREATININE 2.77*  < > 3.37* 2.97*  CALCIUM 7.2*  < > 7.9* 7.7*  MG 1.7  --   --  1.7  PHOS 3.2  --   --   --   < > = values in this interval not displayed. Liver Function Tests:  Recent Labs Lab 01/13/14 0539 01/14/14 0545  AST 87* 59*  ALT  5 <5  ALKPHOS 704* 693*  BILITOT 4.3* 4.0*  PROT 5.7* 5.8*  ALBUMIN 1.4* 1.4*   CBC:  Recent Labs Lab 01/09/14 1015 01/10/14 0540  01/13/14 0539 01/14/14 0545  WBC 8.6 7.7  < > 8.4 8.6  NEUTROABS 6.8 5.6  --   --   --   HGB 7.0* 6.8*  < > 6.7* 6.3*  HCT 20.9* 20.4*  < > 19.8* 19.1*  MCV 105.0* 107.9*  < > 104.2* 108.5*  PLT 125* 138*  < > 229 215  < > = values in this interval not displayed. Cardiac Enzymes:  Recent Labs Lab 01/08/14 2340 01/09/14 0612 01/09/14 1153  CKTOTAL  --  59  --   TROPONINI 0.34* <0.30 <0.30   CBG:  Recent Labs Lab 01/13/14 0758 01/13/14 1136 01/13/14 1731 01/13/14 2135 01/14/14 0020 01/14/14 0742  GLUCAP 252* 276* 265* 275* 279* 258*   Coagulation:  Recent Labs Lab 01/08/14 0925 01/12/14 0850  LABPROT 12.0 12.7  INR 0.89 0.95   Anemia Panel:  Recent Labs Lab 01/11/14 1057  RETICCTPCT 1.2   Urine Drug Screen: Drugs  of Abuse     Component Value Date/Time   LABOPIA NONE DETECTED 01/03/2014 2311   LABOPIA NEG 11/13/2013 1516   COCAINSCRNUR NONE DETECTED 01/03/2014 2311   COCAINSCRNUR NEG 11/13/2013 1516   LABBENZ NONE DETECTED 01/03/2014 2311   LABBENZ NEG 11/13/2013 1516   AMPHETMU NONE DETECTED 01/03/2014 2311   THCU NONE DETECTED 01/03/2014 2311   LABBARB NONE DETECTED 01/03/2014 2311   LABBARB NEG 11/13/2013 1516    Urinalysis:  Recent Labs Lab 01/11/14 1627  COLORURINE AMBER*  LABSPEC 1.019  PHURINE 5.5  GLUCOSEU 100*  HGBUR NEGATIVE  BILIRUBINUR LARGE*  KETONESUR 15*  PROTEINUR >300*  UROBILINOGEN 0.2  NITRITE NEGATIVE  LEUKOCYTESUR SMALL*   Micro Results: Recent Results (from the past 240 hour(s))  CULTURE, BLOOD (ROUTINE X 2)     Status: None   Collection Time    01/05/14  3:30 PM      Result Value Ref Range Status   Specimen Description BLOOD LEFT ARM   Final   Special Requests     Final   Value: BOTTLES DRAWN AEROBIC AND ANAEROBIC 10CC BLUE, 5CC RED   Culture  Setup Time     Final   Value: 01/05/2014  22:48     Performed at [facility]   Culture     Final   Value: STAPHYLOCOCCUS AUREUS     Note: RIFAMPIN AND GENTAMICIN SHOULD NOT BE USED AS SINGLE DRUGS FOR TREATMENT OF STAPH INFECTIONS.     Note: Gram Stain Report Called to,Read Back By and Verified With: Volanda NapoleonNikki Faucette RN on 01/06/14 at 06:45 by Christie NottinghamAnne Skeen     Performed at [facility]   Report Status 01/08/2014 FINAL   Final   Organism ID, Bacteria STAPHYLOCOCCUS AUREUS   Final  CULTURE, BLOOD (ROUTINE X 2)     Status: None   Collection Time    01/05/14  3:42 PM      Result Value Ref Range Status   Specimen Description BLOOD BLOOD LEFT FOREARM   Final   Special Requests BOTTLES DRAWN AEROBIC ONLY 5CC   Final   Culture  Setup Time     Final   Value: 01/05/2014 22:48     Performed at [facility]   Culture     Final   Value: STAPHYLOCOCCUS AUREUS     Note: SUSCEPTIBILITIES PERFORMED ON PREVIOUS CULTURE WITHIN THE LAST 5 DAYS.     Note: Gram Stain Report Called to,Read Back By and Verified With: Volanda NapoleonNikki Faucette RN on 01/06/14 at 06:45 by Christie NottinghamAnne Skeen     Performed at [facility]   Report Status 01/08/2014 FINAL   Final  CULTURE, BLOOD (ROUTINE X 2)     Status: None   Collection Time    01/06/14  3:35 PM      Result Value Ref Range Status   Specimen Description BLOOD LEFT ANTECUBITAL   Final   Special Requests     Final   Value: BOTTLES DRAWN AEROBIC AND ANAEROBIC 10CC AER 5CC ANA   Culture  Setup Time     Final   Value: 01/06/2014 19:02     Performed at [facility]   Culture     Final   Value: STAPHYLOCOCCUS AUREUS     Note: RIFAMPIN AND GENTAMICIN SHOULD NOT BE USED AS SINGLE DRUGS FOR TREATMENT OF STAPH INFECTIONS.     Note: Gram Stain Report Called to,Read Back By and Verified With: JANICE TEMBRINA 01/08/14 AT 0100 RIDK  Performed at [facility]   Report Status 01/10/2014 FINAL   Final   Organism ID, Bacteria STAPHYLOCOCCUS AUREUS   Final  CULTURE, BLOOD (ROUTINE X 2)      Status: None   Collection Time    01/06/14  3:45 PM      Result Value Ref Range Status   Specimen Description BLOOD LEFT HAND   Final   Special Requests     Final   Value: BOTTLES DRAWN AEROBIC AND ANAEROBIC 10CC AER 5CC ANA   Culture  Setup Time     Final   Value: 01/06/2014 19:00     Performed at [facility]   Culture     Final   Value: NO GROWTH 5 DAYS     Performed at [facility]   Report Status 01/13/2014 FINAL   Final  CULTURE, BLOOD (ROUTINE X 2)     Status: None   Collection Time    01/08/14  6:56 AM      Result Value Ref Range Status   Specimen Description BLOOD LEFT HAND   Final   Special Requests BOTTLES DRAWN AEROBIC AND ANAEROBIC 10CC   Final   Culture  Setup Time     Final   Value: 01/08/2014 10:12     Performed at [facility]   Culture     Final   Value: NO GROWTH 5 DAYS     Performed at [facility]   Report Status 01/14/2014 FINAL   Final  CULTURE, BLOOD (ROUTINE X 2)     Status: None   Collection Time    01/08/14  7:05 AM      Result Value Ref Range Status   Specimen Description BLOOD BLOOD LEFT FOREARM   Final   Special Requests BOTTLES DRAWN AEROBIC ONLY [facility]   Final   Culture  Setup Time     Final   Value: 01/08/2014 10:12     Performed at [facility]   Culture     Final   Value: NO GROWTH 5 DAYS     Performed at [facility]   Report Status 01/14/2014 FINAL   Final  CULTURE, BLOOD (ROUTINE X 2)     Status: None   Collection Time    01/10/14 12:47 AM      Result Value Ref Range Status   Specimen Description BLOOD LEFT FOREARM   Final   Special Requests     Final   Value: BOTTLES DRAWN AEROBIC AND ANAEROBIC BLUE 10 CC RED 5 CC   Culture  Setup Time     Final   Value: 01/11/2014 13:10     Performed at [facility]   Culture     Final   Value:        BLOOD CULTURE RECEIVED NO GROWTH TO DATE CULTURE WILL BE HELD FOR 5 DAYS BEFORE ISSUING A FINAL NEGATIVE REPORT     Performed at [facility]   Report Status PENDING   Incomplete  CULTURE, BLOOD (ROUTINE X 2)     Status: None   Collection Time    01/10/14 12:53 AM      Result Value Ref Range Status   Specimen Description BLOOD LEFT HAND   Final   Special Requests BOTTLES DRAWN AEROBIC AND ANAEROBIC 10 CC   Final   Culture  Setup Time     Final   Value: 01/11/2014 13:10     Performed at [facility]   Culture     Final   Value:        BLOOD CULTURE RECEIVED NO GROWTH TO DATE CULTURE WILL BE HELD FOR 5 DAYS BEFORE ISSUING A FINAL NEGATIVE REPORT     Performed at [facility]   Report Status PENDING   Incomplete   Medications: I have reviewed the patient's current medications. Scheduled Meds: . sodium chloride   Intravenous Once  . abacavir  300 mg Oral BID  . acyclovir  800 mg Oral Daily  . aspirin EC  81 mg Oral Daily  .  ceFAZolin (ANCEF) IV  1 g Intravenous Q12H  . cloNIDine  0.2 mg Oral BID  . darbepoetin (ARANESP) injection - NON-DIALYSIS  40 mcg Subcutaneous Q Sat-1800  . Darunavir Ethanolate  800 mg Oral Q breakfast  . enoxaparin (LOVENOX) injection  30 mg Subcutaneous Q24H  . hydrocerin   Topical BID  . insulin aspart  0-9 Units Subcutaneous TID WC  . insulin glargine  20 Units Subcutaneous QHS  . labetalol  300 mg Oral BID  . labetalol      . lacosamide  150 mg Oral BID  . lactulose  20 g Oral TID  . lamiVUDine  50 mg Oral Daily  . levETIRAcetam  500 mg Oral BID  . lidocaine      . magic mouthwash w/lidocaine  5 mL Oral TID  . ritonavir  100 mg Oral Q breakfast  . sodium bicarbonate  650 mg Oral BID  . sodium chloride  3 mL Intravenous Q12H   Continuous Infusions: .  sodium bicarbonate 150 mEq in sterile water 1000 mL infusion 150 mEq (01/14/14 0214)   PRN Meds:.acetaminophen, diphenhydrAMINE-zinc acetate, hydrOXYzine, menthol-cetylpyridinium, ondansetron (ZOFRAN) IV, ondansetron Assessment/Plan: Principal Problem:   Sepsis due to Staphylococcus aureus Active Problems:   HIV  disease   CKD (chronic kidney disease) stage 4, GFR 15-29 ml/min   Diabetes   Anemia of chronic disease   HCV (hepatitis C virus)   Metabolic acidosis, increased anion gap   Altered mental state   Hepatic encephalopathy   Ascites   Fever, unspecified  Assessment: Michelle [name] is a 52 year old woman with a PMH significant for HIV and Hep C coinfection complicated by cirrhotic liver, CKD stage IV, stroke, CHF, DMII who was admitted after being brought to the ED by family due to increased lethargy from hepatic encephalopathy that has resolved, although she was found to have MSSA bacteremia. She is now on antibiotic day 9.  Sepsis due to MSSA: She does not appear septic and her Tmax in the past 24 hrs was 99.4. Her central PICC was placed this morning. Her mental status is at baseline. She had 2/2 blood cultures on 8/10 that grew MSSA. The source of her bacteremia was likely her port-a-cath placed on 12/05/13, and this was removed on 8/11. No signs of SBP or endocarditis on physical exam. Earlier in her hospital course, TEE was neg for endocarditis, CT abd/pelvis with minimum ascites, and MRI spine showed no abscess. She is now on antibiotic day 9.  -Will continue IV cefazolin (now day 8) for 4 weeks total  -Continuing to monitor for s/s of SBP   Acute on Chronic Anemia: Asymptomatic. Her baseline Hg is between 6-8. Her HgB today is 6.3, s/p transfusion of 1 unit of pRBC on 8/14. Anti-histone IgG was negative. Dr. Cyndie Chime in Hematology recommends pRBC transfusion only if she is symptomatic. She received aranesp on  8/15. When talking with Dr. Cyndie Chime, he recommended giving the patient every 2-3 weeks and we will be giving her remaining dose today. He recommended that the patient receive Aranesp 200-380mcg every 2-3 weeks.  She has no signs or symptoms of bleeding. In the past, she had an extensive work up for hemolytic anemia, anemia of chronic disease, with EGD and colonoscopy on 7/23  that were unremarkable for potential bleeding sources. 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 Drug induced anemia by hydralazine has also been proposed as a contributing factor since her ANA titer increased with the use of this medication.  -Will give her the remaining dose of aranesp today ( ) -Daily CBC  -She will need repeat ANA and titer in 1 month for evaluation of hydralazine effect   Acute on Chronic Renal Failure: Cr still elevated but downtrending and at 2.97 today. Baseline Cr in the 2.4-2.8 range. The AKI is likely multifactorial from furosemide use, ACEi use, and possibly 2 days of Vancomycin use for broad spectrum bacteremia coverage. UOP has been adequate and her UA remarkable only for yeast infection. Anti-hypertensive medication doses were reduced by 50% per Nephrology to prevent renal hypoperfusion as she likely has poorly controlled BP chronically. However, over the past 24 hours she has been hypertensive to 224/89, so we will increase her clonidine again from 0.2 to 0.3mg  BID. -Current antihypertensives are Clonidine 0.3mg  BID and labetalol 300 BID; will start clonidine patch for better compliance in the outpatient setting -Strict in and outs  -Holding Lasix and ACEi   HTN: She has been hypertensive for the past 24 hours. She also has had ANA positive with titers that increased from 1:40 to 1:80 after use of hydralazine. After discussing these findings with Dr. Arlean Hopping in Nephrology will discontinued it now. Nephrology recommended decreasing anti-hypertensive medications yesterday in order to increase renal perfusion. However, patient has had >200 systolic and >100 diastolic today. Will increase clonidine.  -Increasing clonidine to 0.3mg  BID  -Continue labetalol at 300mg  BID  -Holding lisinopril 40mg  daily in setting of AKI  -Holding Lasix  -Holding hydralazine 50mg  TID   Vaginal  candidiasis: She complains of white vaginal discharge and pruritus. Her UA is remarkable for many yeast, and is likely due to a candida infection.  -fluconazole 150mg  given yesterday (may need another 150mg  on 8/20 hours)   Hepatic Encephalopathy: Resolved. She arrived lethargic which was concerning for acute hepatic encephalopathy. Ammonia level on admission was 127 and increased to 161, but is at her baseline mental status.  -Continue home lactulose PO PRN titrated to 3BM/day  -Diet heart healthy/ Carb mod only if fully alert   Ascites: Soft and obese abdomen, only mildly distended and not tense. Mild RUQ pain initially but [place] Abd was unremarkable for biliary obstruction or disease. CT abdomen/pelvis on presentation showed mild ascities, but no acute intraabdominal process. SBP unlikely. Bilirubin downtrending and at 4.0 today. U/S from 8/12 did not show a gallstones or gallbladder process. -Will consider diagnostic/therapeutic tap of ascitic fluid if she declines  -CMP in am   History of seizure: Stable. Dilantin was discontinued on admission due to supertherapeutic level of 25 and we will continue to hold it due to renal function. She was started on vimpat instead. She had pseudoseizures on 8/12 and 8/13 with voluntary jerking movements while following commands and remaining fully conversant. No confusion, tongue biting, or incontinence. She has  not had seizure activity during this hospitalization. -Continue vimpat  -Continue Keppra   HIV disease: Well controlled. Her viral load was rechecked and was undetectable on 01/14/14. Her CD4 count was also checked and had a count of 240. It was at 280 in April.  -Continue home abacavir, prezista, norvir, and epivir  -Continue acyclovir for prophylaxis   Hypokalemia: resolved. K is 3.4 today. Will continue monitoring and provide gentle repletion given her CKD4 if K is at or <3.0.   Hepatitis C: Genotype in December 2014. Genotype 1b, viral load of  [id number], Log of 6.63.  -She will f/u with Dr. 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 Secondary to Hepatitis C infection. 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 Platelets and INR stable.   DMII: Last Hg A1C 7.5% on 11/13/13. On Lantus 30 u qHS and Novolog 10-25 u actid at home.  -Lantus 20 units qHS  -Sensitive SSI   DVT prophylaxis: SCDs   Diet:  HH, Carb mod   Dispo: Anticipated discharge to SNF tomorrow. SW has confirmed a bed for her while she is on IV antibiotics for 4 weeks as her sister will not be able to manage the central PICC line and antibiotics.   This is a Psychologist, occupational Note.  The care of the patient was discussed with Dr. Garald Braver and the assessment and plan formulated with their assistance.  Please see their attached note for official documentation of the daily encounter.   LOS: 11 days   Lillia Carmel, Med Student 01/14/2014, 11:42 AM

## 2014-01-14 NOTE — Clinical Social Work Note (Addendum)
Patient has a bed offer from [facility] in [place]. Patient requested that CSW contact sister Arletha. Sister states that [facility] will be offering patient a room. CSW contact [facility] and facility states that this is not the case. At this time patient has one bed offer from [facility].  CSW met with patient and sister Levander Campion at bedside. CSW explained that [facility] is not offering patient a bed. Levander Campion states that she thought that the facility would offer her sister a bed because she visited the facility and she saw that they had beds available. CSW explained that since patient has Medicaid, patient will need to DC to a facility that will accept her Medicaid/letter of guarantee and at this time, [facility] is the option that is available. Per MD patient should be ready to DC on Wednesday. CSW has asked MD to prepare patient/family for this. CSW will assist with DC when appropriate.   Liz Beach MSW, [facility], [place], [phone]

## 2014-01-14 NOTE — Progress Notes (Signed)
ANTIBIOTIC CONSULT NOTE - FOLLOW UP  Pharmacy Consult for Cefazolin Indication: MSSA bacteremia  Allergies  Allergen Reactions  . 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 Morphine And Related Hives, Itching and Rash  . Norvasc [Amlodipine Besylate] Hives, Itching and Rash    Patient Measurements: Height: 5\' 5"  (165.1 cm) Weight: 179 lb 14.3 oz (81.6 kg) IBW/kg (Calculated) : 57  Vital Signs: Temp: 98.4 F (36.9 C) (08/18 0639) Temp src: Oral (08/18 0639) BP: 217/86 mmHg (08/18 1049) Pulse Rate: 65 (08/18 1049) Intake/Output from previous day: 08/17 0701 - 08/18 0700 In: 600 [P.O.:600] Out: -  Intake/Output from this shift:    Labs:  Recent Labs  01/11/14 1627 01/12/14 0643 01/12/14 0850 01/13/14 0539 01/14/14 0545  WBC  --   --  7.6 8.4 8.6  HGB  --   --  7.0* 6.7* 6.3*  PLT  --   --  202 229 215  LABCREA 107.14  --   --   --   --   CREATININE  --  3.19*  --  3.37* 2.97*   Estimated Creatinine Clearance: 22.8 ml/min (by C-G formula based on Cr of 2.97). No results found for this basename: Rolm Gala, VANCORANDOM, GENTTROUGH, GENTPEAK, GENTRANDOM, TOBRATROUGH, TOBRAPEAK, TOBRARND, AMIKACINPEAK, AMIKACINTROU, AMIKACIN,  in the last 72 hours   Microbiology: Recent Results (from the past 720 hour(s))  URINE CULTURE     Status: None   Collection Time    12/17/13  9:55 AM      Result Value Ref Range Status   Specimen Description URINE, CATHETERIZED   Final   Special Requests NONE   Final   Culture  Setup Time     Final   Value: 12/17/2013 13:41     Performed at [facility] Count     Final   Value: NO GROWTH     Performed at [facility]   Culture     Final   Value: NO GROWTH     Performed at [facility]   Report Status 12/18/2013 FINAL   Final  CLOSTRIDIUM DIFFICILE BY PCR     Status: None   Collection Time    12/17/13  7:58 PM      Result  Value Ref Range Status   C difficile by pcr NEGATIVE  NEGATIVE Final  URINE CULTURE     Status: None   Collection Time    12/20/13  6:24 PM      Result Value Ref Range Status   Specimen Description URINE, CLEAN CATCH   Final   Special Requests NONE   Final   Culture  Setup Time     Final   Value: 12/21/2013 01:19     Performed at [facility] Count     Final   Value: >=100,000 COLONIES/ML     Performed at [facility]   Culture     Final   Value: Multiple bacterial morphotypes present, none predominant. Suggest appropriate recollection if clinically indicated.     Performed at [facility]   Report Status 12/22/2013 FINAL   Final  MRSA PCR SCREENING     Status: Abnormal   Collection Time    01/04/14  4:35 AM      Result Value Ref Range Status   MRSA by PCR POSITIVE (*) NEGATIVE Final   Comment:  The GeneXpert MRSA Assay (FDA     approved for NASAL specimens     only), is one component of a     comprehensive MRSA colonization     surveillance program. It is not     intended to diagnose MRSA     infection nor to guide or     monitor treatment for     MRSA infections.     RESULT CALLED TO, READ BACK BY AND VERIFIED WITH:     R.ZELLNER,RN [id number] 01/04/14 M.CAMPBELL  CULTURE, BLOOD (ROUTINE X 2)     Status: None   Collection Time    01/05/14  3:30 PM      Result Value Ref Range Status   Specimen Description BLOOD LEFT ARM   Final   Special Requests     Final   Value: BOTTLES DRAWN AEROBIC AND ANAEROBIC 10CC BLUE, 5CC RED   Culture  Setup Time     Final   Value: 01/05/2014 22:48     [facility]formed [facility]   Culture     Final   Value: STAPHYLOCOCCUS AUREUS     Note: RIFAMPIN AND GENTAMICIN SHOULD NOT BE USED AS SINGLE DRUGS FOR TREATMENT OF STAPH INFECTIONS.     Note: Gram Stain Report Called to,Read Back By and Verified With: Volanda NapoleonNikki Faucette RN on 01/06/14 at 06:45 by Christie NottinghamAnne Skeen     [facility]formed at [facility]    Report Status 01/08/2014 FINAL   Final   Organism ID, Bacteria STAPHYLOCOCCUS AUREUS   Final  CULTURE, BLOOD (ROUTINE X 2)     Status: None   Collection Time    01/05/14  3:42 PM      Result Value Ref Range Status   Specimen Description BLOOD BLOOD LEFT FOREARM   Final   Special Requests BOTTLES DRAWN AEROBIC ONLY 5CC   Final   Culture  Setup Time     Final   Value: 01/05/2014 22:48     [facility]formed at [facility]   Culture     Final   Value: STAPHYLOCOCCUS AUREUS     Note: SUSCEPTIBILITIES PERFORMED ON PREVIOUS CULTURE WITHIN THE LAST 5 DAYS.     Note: Gram Stain Report Called to,Read Back By and Verified With: Volanda NapoleonNikki Faucette RN on 01/06/14 at 06:45 by Christie NottinghamAnne Skeen     [facility]   Report Status 01/08/2014 FINAL   Final  CULTURE, BLOOD (ROUTINE X 2)     Status: None   Collection Time    01/06/14  3:35 PM      Result Value Ref Range Status   Specimen Description BLOOD LEFT ANTECUBITAL   Final   Special Requests     Final   Value: BOTTLES DRAWN AEROBIC AND ANAEROBIC 10CC AER 5CC ANA   Culture  Setup Time     Final   Value: 01/06/2014 19:02     Performed at [facility]   Culture     Final   Value: STAPHYLOCOCCUS AUREUS     Note: RIFAMPIN AND GENTAMICIN SHOULD NOT BE USED AS SINGLE DRUGS FOR TREATMENT OF STAPH INFECTIONS.     Note: Gram Stain Report Called to,Read Back By and Verified With: Junius ArgyleJANICE TEMBRINA 01/08/14 AT 0100 RIDK     Performed at [facility]   Report Status 01/10/2014 FINAL   Final   Organism ID, Bacteria STAPHYLOCOCCUS AUREUS   Final  CULTURE, BLOOD (ROUTINE X 2)     Status:  None   Collection Time    01/06/14  3:45 PM      Result Value Ref Range Status   Specimen Description BLOOD LEFT HAND   Final   Special Requests     Final   Value: BOTTLES DRAWN AEROBIC AND ANAEROBIC 10CC AER 5CC ANA   Culture  Setup Time     Final   Value: 01/06/2014 19:00     Performed at [facility]   Culture     Final   Value: NO  GROWTH 5 DAYS     Performed at [facility]   Report Status 01/13/2014 FINAL   Final  CULTURE, BLOOD (ROUTINE X 2)     Status: None   Collection Time    01/08/14  6:56 AM      Result Value Ref Range Status   Specimen Description BLOOD LEFT HAND   Final   Special Requests BOTTLES DRAWN AEROBIC AND ANAEROBIC 10CC   Final   Culture  Setup Time     Final   Value: 01/08/2014 10:12     Performed at [facility]   Culture     Final   Value: NO GROWTH 5 DAYS     Performed at [facility]   Report Status 01/14/2014 FINAL   Final  CULTURE, BLOOD (ROUTINE X 2)     Status: None   Collection Time    01/08/14  7:05 AM      Result Value Ref Range Status   Specimen Description BLOOD BLOOD LEFT FOREARM   Final   Special Requests BOTTLES DRAWN AEROBIC ONLY [facility]   Final   Culture  Setup Time     Final   Value: 01/08/2014 10:12     Performed at [facility]   Culture     Final   Value: NO GROWTH 5 DAYS     Performed at [facility]   Report Status 01/14/2014 FINAL   Final  CULTURE, BLOOD (ROUTINE X 2)     Status: None   Collection Time    01/10/14 12:47 AM      Result Value Ref Range Status   Specimen Description BLOOD LEFT FOREARM   Final   Special Requests     Final   Value: BOTTLES DRAWN AEROBIC AND ANAEROBIC BLUE 10 CC RED 5 CC   Culture  Setup Time     Final   Value: 01/11/2014 13:10     Performed at [facility]   Culture     Final   Value:        BLOOD CULTURE RECEIVED NO GROWTH TO DATE CULTURE WILL BE HELD FOR 5 DAYS BEFORE ISSUING A FINAL NEGATIVE REPORT     Performed at [facility]   Report Status PENDING   Incomplete  CULTURE, BLOOD (ROUTINE X 2)     Status: None   Collection Time    01/10/14 12:53 AM      Result Value Ref Range Status   Specimen Description BLOOD LEFT HAND   Final   Special Requests BOTTLES DRAWN AEROBIC AND ANAEROBIC 10 CC   Final   Culture  Setup Time     Final   Value: 01/11/2014 13:10     Performed  at [facility]   Culture     Final   Value:        BLOOD CULTURE RECEIVED NO GROWTH TO DATE CULTURE WILL BE HELD FOR 5 DAYS BEFORE  ISSUING A FINAL NEGATIVE REPORT     Performed at [facility]   Report Status PENDING   Incomplete    Anti-infectives   Start     Dose/Rate Route Frequency Ordered Stop   01/14/14 0600  ceFAZolin (ANCEF) IVPB 1 g/50 mL premix     1 g 100 mL/hr over 30 Minutes Intravenous Every 12 hours 01/13/14 1320     01/13/14 1600  ceFAZolin (ANCEF) IVPB 1 g/50 mL premix  Status:  Discontinued     1 g 100 mL/hr over 30 Minutes Intravenous Every 12 hours 01/13/14 1007 01/13/14 1304   01/13/14 1400  ceFAZolin (ANCEF) IVPB 1 g/50 mL premix  Status:  Discontinued     1 g 100 mL/hr over 30 Minutes Intravenous Every 24 hours 01/13/14 1304 01/13/14 1320   01/13/14 0945  fluconazole (DIFLUCAN) tablet 150 mg     150 mg Oral  Once 01/13/14 0933 01/13/14 1302   01/09/14 0630  vancomycin (VANCOCIN) IVPB 750 mg/150 ml premix  Status:  Discontinued     750 mg 150 mL/hr over 60 Minutes Intravenous Every 48 hours 01/07/14 1418 01/08/14 1433   01/08/14 1000  lamiVUDine (EPIVIR) 10 MG/ML solution 50 mg     50 mg Oral Daily 01/07/14 1517     01/07/14 0600  vancomycin (VANCOCIN) IVPB 750 mg/150 ml premix  Status:  Discontinued     750 mg 150 mL/hr over 60 Minutes Intravenous Every 24 hours 01/06/14 0701 01/07/14 1418   01/06/14 1600  ceFAZolin (ANCEF) IVPB 2 g/50 mL premix  Status:  Discontinued     2 g 100 mL/hr over 30 Minutes Intravenous Every 12 hours 01/06/14 1518 01/13/14 1007   01/06/14 1500  lamiVUDine (EPIVIR) 10 MG/ML solution 100 mg  Status:  Discontinued     100 mg Oral Daily 01/06/14 1402 01/07/14 1517   01/06/14 0800  vancomycin (VANCOCIN) 1,250 mg in sodium chloride 0.9 % 250 mL IVPB     1,250 mg 166.7 mL/hr over 90 Minutes Intravenous  Once 01/06/14 0701 01/06/14 1156   01/04/14 1000  lamiVUDine (EPIVIR) tablet 150 mg  Status:  Discontinued     150 mg  Oral Daily 01/04/14 0414 01/06/14 1401   01/04/14 1000  abacavir (ZIAGEN) tablet 300 mg     300 mg Oral 2 times daily 01/04/14 0414     01/04/14 1000  acyclovir (ZOVIRAX) tablet 800 mg     800 mg Oral Daily 01/04/14 0414     01/04/14 0800  Darunavir Ethanolate (PREZISTA) tablet 800 mg     800 mg Oral Daily with breakfast 01/04/14 0414     01/04/14 0800  ritonavir (NORVIR) capsule 100 mg     100 mg Oral Daily with breakfast 01/04/14 0414        Assessment: 53 yo f admitted on 8/14 for lethargy.  On day 9 of cefazolin for MSSA bacteremia. S/p TEE negative for endocarditis  Wbc wnl, tmax 100.3, currently afebrile, SCr 3.37 on 8/17  Vancomycin 8/10 >> 8/12 Cefazolin 8/10 >>  8/9 Bld x2: MSSA (2/2) 8/10 Bld x2: MSSA (1/2) 8/12 Bld x2: NGTD 8/14 Bld x2: NGTD  Goal of Therapy:  Eradication of infection Appropriate cefazolin dosing  Plan:  Reduce cefazolin to 1g q12h Monitor renal function, fevers, repeat blood cultures  Arlean Hopping. Newman Pies, PharmD Clinical Pharmacist Pager [phone]   01/13/14

## 2014-01-14 NOTE — Procedures (Signed)
RIJV PICC 21 cm SVC RA No comp 

## 2014-01-14 NOTE — Progress Notes (Signed)
PT Cancellation Note  Patient Details Name: Michelle [name] MRN: [id number] DOB: [birth year]   Cancelled Treatment:    Reason Eval/Treat Not Completed: Medical issues which prohibited therapy Pt with elevated BP at rest in supine position 199/106. RN made aware. Hgb dropped to 6.3 today. Pt also refusing to participate in therapy. Will follow up on 8/19 as appropriate.   Alvie Heidelberg A 01/14/2014, 1:37 PM Alvie Heidelberg, PT, DPT [phone]

## 2014-01-14 NOTE — Progress Notes (Addendum)
After Hydralazine 5 mg IV, BP=186/77. Will continue to monitor.

## 2014-01-14 NOTE — Progress Notes (Addendum)
BP continue to be elevated 217/92. MD called and obtained a new order for Hydralazine 5 mg IV once. Will continue to monitor.

## 2014-01-14 NOTE — Progress Notes (Cosign Needed)
Subjective: She complained of chest wall tenderness today that is improving. She denied shortness of breath, abdominal pain. She was afebrile overnight with Tmax of 98.30F.  Objective: Vital signs in last 24 hours: Filed Vitals:   01/14/14 0639 01/14/14 1025 01/14/14 1039 01/14/14 1049  BP: 181/78 214/87 224/89 217/86  Pulse: 79 66 71 65  Temp: 98.4 F (36.9 C)     TempSrc: Oral     Resp: 20 22 18 19   Height:      Weight: 179 lb 14.3 oz (81.6 kg)     SpO2: 99% 100% 100% 100%   Weight change: 0 lb (0 kg)  Intake/Output Summary (Last 24 hours) at 01/14/14 1109 Last data filed at 01/14/14 0030  Gross per 24 hour  Intake    480 ml  Output      0 ml  Net    480 ml   Vital signs reviewed  Gen; Sitting in chair, in NAD. HEENT: mild scleral icterus, MM  CV: RRR  Chest: Peripheral IV to her left chest with ecchymosis that is improving it extends to her medial left arm with TTP, no increased warmth, no erythema. Right chest with site of port-a-cath that was removed with no edema/erythema, covered with dressing that is c/d/i.  Resp: bibasilar crackles bilaterally, normal respiratory effort, no wheezing  Abd: mildly distended, non tender, soft, bowel sounds present.  Ext: no peripheral edema, dry skin, no teleangiectasia.  Neuro: Alert and oriented x3, no slurred speech, no asterixis, moves her extremities voluntairly  Lab Results: Basic Metabolic Panel:  Recent Labs Lab 01/08/14 2340  01/13/14 0539 01/14/14 0545  NA 136*  < > 134* 137  K 3.5*  < > 3.6* 3.4*  CL 103  < > 103 103  CO2 16*  < > 14* 20  GLUCOSE 256*  < > 212* 242*  BUN 49*  < > 57* 50*  CREATININE 2.77*  < > 3.37* 2.97*  CALCIUM 7.2*  < > 7.9* 7.7*  MG 1.7  --   --  1.7  PHOS 3.2  --   --   --   < > = values in this interval not displayed. Liver Function Tests:  Recent Labs Lab 01/13/14 0539 01/14/14 0545  AST 87* 59*  ALT 5 <5  ALKPHOS 704* 693*  BILITOT 4.3* 4.0*  PROT 5.7* 5.8*  ALBUMIN 1.4*  1.4*   CBC:  Recent Labs Lab 01/09/14 1015 01/10/14 0540  01/13/14 0539 01/14/14 0545  WBC 8.6 7.7  < > 8.4 8.6  NEUTROABS 6.8 5.6  --   --   --   HGB 7.0* 6.8*  < > 6.7* 6.3*  HCT 20.9* 20.4*  < > 19.8* 19.1*  MCV 105.0* 107.9*  < > 104.2* 108.5*  PLT 125* 138*  < > 229 215  < > = values in this interval not displayed. Cardiac Enzymes:  Recent Labs Lab 01/08/14 2340 01/09/14 0612 01/09/14 1153  CKTOTAL  --  59  --   TROPONINI 0.34* <0.30 <0.30   CBG:  Recent Labs Lab 01/13/14 0758 01/13/14 1136 01/13/14 1731 01/13/14 2135 01/14/14 0020 01/14/14 0742  GLUCAP 252* 276* 265* 275* 279* 258*   Coagulation:  Recent Labs Lab 01/08/14 0925 01/12/14 0850  LABPROT 12.0 12.7  INR 0.89 0.95   Anemia Panel:  Recent Labs Lab 01/11/14 1057  RETICCTPCT 1.2      Recent Labs Lab 01/11/14 1627  COLORURINE AMBER*  LABSPEC 1.019  PHURINE 5.5  GLUCOSEU 100*  HGBUR NEGATIVE  BILIRUBINUR LARGE*  KETONESUR 15*  PROTEINUR >300*  UROBILINOGEN 0.2  NITRITE NEGATIVE  LEUKOCYTESUR SMALL*    Micro Results: Recent Results (from the past 240 hour(s))  CULTURE, BLOOD (ROUTINE X 2)     Status: None   Collection Time    01/05/14  3:30 PM      Result Value Ref Range Status   Specimen Description BLOOD LEFT ARM   Final   Special Requests     Final   Value: BOTTLES DRAWN AEROBIC AND ANAEROBIC 10CC BLUE, 5CC RED   Culture  Setup Time     Final   Value: 01/05/2014 22:48     Performed at [facility]   Culture     Final   Value: STAPHYLOCOCCUS AUREUS     Note: RIFAMPIN AND GENTAMICIN SHOULD NOT BE USED AS SINGLE DRUGS FOR TREATMENT OF STAPH INFECTIONS.     Note: Gram Stain Report Called to,Read Back By and Verified With: Volanda Napoleon RN on 01/06/14 at 06:45 by Christie Nottingham     Performed at [facility]   Report Status 01/08/2014 FINAL   Final   Organism ID, Bacteria STAPHYLOCOCCUS AUREUS   Final  CULTURE, BLOOD (ROUTINE X 2)     Status: None    Collection Time    01/05/14  3:42 PM      Result Value Ref Range Status   Specimen Description BLOOD BLOOD LEFT FOREARM   Final   Special Requests BOTTLES DRAWN AEROBIC ONLY 5CC   Final   Culture  Setup Time     Final   Value: 01/05/2014 22:48     Performed at [facility]   Culture     Final   Value: STAPHYLOCOCCUS AUREUS     Note: SUSCEPTIBILITIES PERFORMED ON PREVIOUS CULTURE WITHIN THE LAST 5 DAYS.     Note: Gram Stain Report Called to,Read Back By and Verified With: Volanda Napoleon RN on 01/06/14 at 06:45 by Christie Nottingham     Performed at [facility]   Report Status 01/08/2014 FINAL   Final  CULTURE, BLOOD (ROUTINE X 2)     Status: None   Collection Time    01/06/14  3:35 PM      Result Value Ref Range Status   Specimen Description BLOOD LEFT ANTECUBITAL   Final   Special Requests     Final   Value: BOTTLES DRAWN AEROBIC AND ANAEROBIC 10CC AER 5CC ANA   Culture  Setup Time     Final   Value: 01/06/2014 19:02     Performed at [facility]   Culture     Final   Value: STAPHYLOCOCCUS AUREUS     Note: RIFAMPIN AND GENTAMICIN SHOULD NOT BE USED AS SINGLE DRUGS FOR TREATMENT OF STAPH INFECTIONS.     Note: Gram Stain Report Called to,Read Back By and Verified With: Junius Argyle 01/08/14 AT 0100 RIDK     Performed at [facility]   Report Status 01/10/2014 FINAL   Final   Organism ID, Bacteria STAPHYLOCOCCUS AUREUS   Final  CULTURE, BLOOD (ROUTINE X 2)     Status: None   Collection Time    01/06/14  3:45 PM      Result Value Ref Range Status   Specimen Description BLOOD LEFT HAND   Final   Special Requests     Final   Value: BOTTLES DRAWN AEROBIC AND ANAEROBIC 10CC AER 5CC ANA  Culture  Setup Time     Final   Value: 01/06/2014 19:00     Performed at [facility]   Culture     Final   Value: NO GROWTH 5 DAYS     Performed at [facility]   Report Status 01/13/2014 FINAL   Final  CULTURE, BLOOD (ROUTINE X 2)     Status: None    Collection Time    01/08/14  6:56 AM      Result Value Ref Range Status   Specimen Description BLOOD LEFT HAND   Final   Special Requests BOTTLES DRAWN AEROBIC AND ANAEROBIC 10CC   Final   Culture  Setup Time     Final   Value: 01/08/2014 10:12     Performed at [facility]   Culture     Final   Value: NO GROWTH 5 DAYS     Performed at [facility]   Report Status 01/14/2014 FINAL   Final  CULTURE, BLOOD (ROUTINE X 2)     Status: None   Collection Time    01/08/14  7:05 AM      Result Value Ref Range Status   Specimen Description BLOOD BLOOD LEFT FOREARM   Final   Special Requests BOTTLES DRAWN AEROBIC ONLY [facility]   Final   Culture  Setup Time     Final   Value: 01/08/2014 10:12     Performed at [facility]   Culture     Final   Value: NO GROWTH 5 DAYS     Performed at [facility]   Report Status 01/14/2014 FINAL   Final  CULTURE, BLOOD (ROUTINE X 2)     Status: None   Collection Time    01/10/14 12:47 AM      Result Value Ref Range Status   Specimen Description BLOOD LEFT FOREARM   Final   Special Requests     Final   Value: BOTTLES DRAWN AEROBIC AND ANAEROBIC BLUE 10 CC RED 5 CC   Culture  Setup Time     Final   Value: 01/11/2014 13:10     Performed at [facility]   Culture     Final   Value:        BLOOD CULTURE RECEIVED NO GROWTH TO DATE CULTURE WILL BE HELD FOR 5 DAYS BEFORE ISSUING A FINAL NEGATIVE REPORT     Performed at [facility]   Report Status PENDING   Incomplete  CULTURE, BLOOD (ROUTINE X 2)     Status: None   Collection Time    01/10/14 12:53 AM      Result Value Ref Range Status   Specimen Description BLOOD LEFT HAND   Final   Special Requests BOTTLES DRAWN AEROBIC AND ANAEROBIC 10 CC   Final   Culture  Setup Time     Final   Value: 01/11/2014 13:10     Performed at [facility]   Culture     Final   Value:        BLOOD CULTURE RECEIVED NO GROWTH TO DATE CULTURE WILL BE HELD FOR 5 DAYS BEFORE  ISSUING A FINAL NEGATIVE REPORT     Performed at [facility]   Report Status PENDING   Incomplete  Medications: I have reviewed the patient's current medications. Scheduled Meds: . sodium chloride   Intravenous Once  . abacavir  300 mg Oral BID  . acyclovir  800 mg Oral  Daily  . aspirin EC  81 mg Oral Daily  .  ceFAZolin (ANCEF) IV  1 g Intravenous Q12H  . cloNIDine  0.2 mg Oral BID  . darbepoetin (ARANESP) injection - NON-DIALYSIS  40 mcg Subcutaneous Q Sat-1800  . Darunavir Ethanolate  800 mg Oral Q breakfast  . enoxaparin (LOVENOX) injection  30 mg Subcutaneous Q24H  . hydrocerin   Topical BID  . insulin aspart  0-9 Units Subcutaneous TID WC  . insulin glargine  20 Units Subcutaneous QHS  . labetalol  300 mg Oral BID  . labetalol      . lacosamide  150 mg Oral BID  . lactulose  20 g Oral TID  . lamiVUDine  50 mg Oral Daily  . levETIRAcetam  500 mg Oral BID  . lidocaine      . magic mouthwash w/lidocaine  5 mL Oral TID  . ritonavir  100 mg Oral Q breakfast  . sodium bicarbonate  650 mg Oral BID  . sodium chloride  3 mL Intravenous Q12H   Continuous Infusions: .  sodium bicarbonate 150 mEq in sterile water 1000 mL infusion 150 mEq (01/14/14 0214)   PRN Meds:.acetaminophen, diphenhydrAMINE-zinc acetate, hydrOXYzine, menthol-cetylpyridinium, ondansetron (ZOFRAN) IV, ondansetron Assessment/Plan: 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 She is now on antibiotic day 10.   Sepsis due to MSSA: Does not appear septic with Tmax 98.28F overnight. Her mental status is unchanged and at baseline. She had 2/2 BC that grew MSSA. The source of her bacteremia was likely her port-a-cath placed on 12/05/13 which  was removed on 8/11. No signs of SBP or endocarditis on physical exam. TEE neg for endocarditis. CT abd/pelvis with minimum ascites. MRI spine with no abscess. She is now on antibiotic day 10. Had Right IJV PICC placed today.  -Will continue cefazolin (now day 8) for 4 weeks total  -Continuing to monitor for s/s of SBP   Acute on chronic Anemia: Asymptomatic. Baseline Hg of 6-8. Her HgB today is 6.3 but she is asymptomatic, she is s/p transfusion of 1 unit of pRBC on 8/14. No s/s of bleeding. Has had extensive work up for this anemia. This is lkely multifactorial w bone marrow suppression due to ART therapy and ACD 2/2 to her CKD4. DNA-histone negative. Dr. Cyndie ChimeGranfortuna in Hematology/Concology recommends pRBC transfusion only if she is symptomatic.  -She received a partial dose of Aranesp 40mcg on Saturday, 8/15 per Dr. Arlean HoppingSchertz in Nephrology  -Aranesp 150mcg once today per Dr. Cyndie ChimeGranfortuna in Hematology/Oncology -she will need Aranesp 200mcg every 2-3 weeks.  -Daily CBC  -She will need repeat ANA and titer in 1 month for evaluation of hydralazine effect   Acute on Chronic Renal Failure: Cr trending down to 2.97. Baseline Cr in the 2.4-2.8 range. UOP has been adequate.  -Strict in and outs  -Hold Lasix and ACEi   Hepatic Encephalopathy: Resolved.  -Continue home lactulose PO PRN titrated to 3BM/day  -Diet heart healthy/ Carb mod only if fully alert   Ascites: Soft and obese abdomen, only mildly distended and not tense.  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 No seizure activity so far.  -Continue vimpat  -Continue Keppra   Hypokalemia: resolved. K is 3.6 today. Will continue monitoring and provide gentle repletion given her CKD4 if K is at or <3.0.   Hepatitis C: Genotype in December 2014. Genotype 1b, viral  load of 4276200, Log of 6.63.  -She will f/u with Dr. Drue Second in ID for possible treatment of Hep C.  HIV disease: Well controlled with viral load undectable in 09/08/13. CD4 count in April at 280. Will recheck labs as dilantin level has been super therapeutic and can interact with antiretroviral.  -Repeat CD4  -Recheck viral load  -Continue home abacavir, prezista, norvir, and epivir  -Continue acyclovir for prophylaxis   HTN: Normal to low. She also has had ANA positive with titers that increased from 1:40 to 1:80 after use of hydralazine. After discussing these findings with Dr. Arlean Hopping in Nephrology will discontinue hydralazine for now.  -Reduce home clonidine 0.2mg  BID  -Discontinue hydralazine 50mg  TID  -Resume labetalol at increased dose to 300mg  BID  -Hold lisinopril 40mg  daily in setting of AKI  -Hold Lasix for now   Vaginal candidiasis: She complains of white vaginal discharge and itching. Her UA is remarkable for many yeast. This is likely 2/2 to candidal infection.  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 Secondary to Hepatitis C infection. 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 Platelets and INR stable.   DMII: Last Hg A1C 7.5% on 11/13/13. On Lantus 30 u qHS and Novolog 10-25 u actid at home.  -Lantus 20 units qHS  -Sensitive SSI   DVT prophylaxis: SCDs  Diet:  HH, Carb mod   Dispo: Disposition is deferred at this time, awaiting improvement of current medical problems. Anticipated discharge in approximately 2-3 day(s). She needs SNF placement while on Abx tx for 4 weeks as her sister will not be able to manage the central PICC line and Abx.   The patient does have a current PCP Christen Bame, MD) and does need an [facility] hospital follow-up appointment after discharge.  The patient does have transportation limitations that hinder transportation to clinic appointments.  .Services Needed at time of discharge: Y =  Yes, Blank = No PT:   OT:   RN:   Equipment:   Other:     LOS: 11 days   Ky Barban, MD 01/14/2014, 11:09 AM

## 2014-01-14 NOTE — Progress Notes (Signed)
CRITICAL VALUE ALERT  Critical value received:  hgb 6.3  Date of notification:  01/14/2014  Time of notification:  0642  Critical value read back:Yes.    Nurse who received alert:  Cammie Faulstich, RN  MD notified (1st page):  Teaching Service  Time of first page: [phone]  MD notified (2nd page):  Time of second page:  Responding MD:  Griffin Basil, MD  Time MD responded:  [phone]

## 2014-01-14 NOTE — Progress Notes (Signed)
Patient states "I feel better after that Maalox, I think I need more of that. " Pt without any complaints at this time, in no distress and resting comfortably. Will continue to monitor.

## 2014-01-14 NOTE — Progress Notes (Signed)
Patient complaining of chest pain, "It hurts really bad. It's sharp and i feel it in my chest and back !", BP 196/82, Resp 26, Temp 98.8, O2 98% on RA . Notified Isabella Bowens, MD who placed order for 12 lead EKG stat and one time dose of Maalox. Will continue to monitor.

## 2014-01-14 NOTE — Progress Notes (Signed)
Pt c/o chest pain stating "I feel pain in my chest and in my back, it hurts." BP 206/76 HR 82. Provider on call notified. EKG completed and placed in chart. MD stated she would place orders.

## 2014-01-14 NOTE — Progress Notes (Signed)
  I have seen and examined the patient, and reviewed the daily progress note by Lillia Carmel, MS 4 and discussed the care of the patient with them. Please see my progress note from 01/14/2014 for further details regarding assessment and plan.    Signed:  Ky Barban, MD 01/14/2014, 7:04 PM

## 2014-01-14 NOTE — Progress Notes (Signed)
Inpatient Diabetes Program Recommendations  AACE/ADA: New Consensus Statement on Inpatient Glycemic Control (2013)  Target Ranges:  Prepandial:   less than 140 mg/dL      Peak postprandial:   less than 180 mg/dL (1-2 hours)      Critically ill patients:  140 - 180 mg/dL  Results for Michelle [name], Michelle [name] (MRN [id number]) as of 01/14/2014 15:28  Ref. Range 01/13/2014 17:31 01/13/2014 21:35 01/14/2014 00:20 01/14/2014 07:42 01/14/2014 11:39  Glucose-Capillary Latest Range: 70-99 mg/dL 716 (H) 967 (H) 893 (H) 258 (H) 188 (H)   Inpatient Diabetes Program Recommendations Insulin - Basal: consider increasing Lantus to 25 units  Insulin - Meal Coverage: add Novolog 3 units TID with meals (titrate up for elevated postprandials) Thank you  Piedad Climes BSN, RN,CDE Inpatient Diabetes Coordinator [phone] (team pager)

## 2014-01-15 LAB — CBC
HEMATOCRIT: 19.7 % — AB (ref 36.0–46.0)
Hemoglobin: 6.4 g/dL — CL (ref 12.0–15.0)
MCH: 34.6 pg — ABNORMAL HIGH (ref 26.0–34.0)
MCHC: 32.5 g/dL (ref 30.0–36.0)
MCV: 106.5 fL — AB (ref 78.0–100.0)
Platelets: 225 10*3/uL (ref 150–400)
RBC: 1.85 MIL/uL — ABNORMAL LOW (ref 3.87–5.11)
RDW: 14.9 % (ref 11.5–15.5)
WBC: 8.5 10*3/uL (ref 4.0–10.5)

## 2014-01-15 LAB — COMPREHENSIVE METABOLIC PANEL
ALBUMIN: 1.2 g/dL — AB (ref 3.5–5.2)
ALK PHOS: 669 U/L — AB (ref 39–117)
ALT: <5 U/L (ref 0–35)
ANION GAP: 14 (ref 5–15)
AST: 62 U/L — ABNORMAL HIGH (ref 0–37)
BUN: 44 mg/dL — AB (ref 6–23)
CO2: 22 mEq/L (ref 19–32)
CREATININE: 2.72 mg/dL — AB (ref 0.50–1.10)
Calcium: 7.6 mg/dL — ABNORMAL LOW (ref 8.4–10.5)
Chloride: 101 mEq/L (ref 96–112)
GFR calc non Af Amer: 19 mL/min — ABNORMAL LOW (ref >90)
GFR, EST AFRICAN AMERICAN: 22 mL/min — AB (ref >90)
GLUCOSE: 262 mg/dL — AB (ref 70–99)
Potassium: 3.6 mEq/L — ABNORMAL LOW (ref 3.7–5.3)
Sodium: 137 mEq/L (ref 137–147)
TOTAL PROTEIN: 6 g/dL (ref 6.0–8.3)
Total Bilirubin: 3.5 mg/dL — ABNORMAL HIGH (ref 0.3–1.2)

## 2014-01-15 LAB — GLUCOSE, CAPILLARY
Glucose-Capillary: 278 mg/dL — ABNORMAL HIGH (ref 70–99)
Glucose-Capillary: 305 mg/dL — ABNORMAL HIGH (ref 70–99)
Glucose-Capillary: 312 mg/dL — ABNORMAL HIGH (ref 70–99)

## 2014-01-15 MED ORDER — CEFAZOLIN SODIUM 1-5 GM-% IV SOLN: 1.0000 g | Freq: Two times a day (BID) | INTRAVENOUS | Status: DC

## 2014-01-15 MED ORDER — ACETAMINOPHEN 325 MG PO TABS: 650.0000 mg | ORAL_TABLET | Freq: Four times a day (QID) | ORAL | Status: DC | PRN

## 2014-01-15 MED ORDER — HYDROXYZINE HCL 10 MG PO TABS: 10.0000 mg | ORAL_TABLET | Freq: Three times a day (TID) | ORAL | Status: AC | PRN

## 2014-01-15 MED ORDER — LAMIVUDINE 10 MG/ML PO SOLN: 100.0000 mg | Freq: Every day | ORAL | Status: DC

## 2014-01-15 MED ORDER — CLONIDINE HCL 0.3 MG PO TABS
0.3000 mg | ORAL_TABLET | Freq: Once | ORAL | Status: AC
  Administered 2014-01-15: 0.3 mg via ORAL
  Filled 2014-01-15: qty 1

## 2014-01-15 MED ORDER — HYDROCERIN EX CREA: 1.0000 "application " | TOPICAL_CREAM | Freq: Two times a day (BID) | CUTANEOUS | Status: DC

## 2014-01-15 MED ORDER — HEPARIN SOD (PORK) LOCK FLUSH 100 UNIT/ML IV SOLN: 250.0000 [IU] | INTRAVENOUS | Status: DC | PRN

## 2014-01-15 MED ORDER — LACTULOSE 10 GM/15ML PO SOLN: 20.0000 g | Freq: Three times a day (TID) | ORAL | Status: DC

## 2014-01-15 MED ORDER — LAMIVUDINE 10 MG/ML PO SOLN: 50.0000 mg | Freq: Every day | ORAL | Status: DC

## 2014-01-15 MED ORDER — LABETALOL HCL 300 MG PO TABS
300.0000 mg | ORAL_TABLET | Freq: Once | ORAL | Status: AC
  Administered 2014-01-15: 300 mg via ORAL
  Filled 2014-01-15: qty 1

## 2014-01-15 MED ORDER — LACOSAMIDE 150 MG PO TABS: 150.0000 mg | ORAL_TABLET | Freq: Two times a day (BID) | ORAL | Status: AC

## 2014-01-15 MED ORDER — LABETALOL HCL 300 MG PO TABS
600.0000 mg | ORAL_TABLET | Freq: Two times a day (BID) | ORAL | Status: DC
  Filled 2014-01-15: qty 2

## 2014-01-15 MED ORDER — HYDRALAZINE HCL 20 MG/ML IJ SOLN
5.0000 mg | Freq: Once | INTRAMUSCULAR | Status: AC
  Administered 2014-01-15: 5 mg via INTRAVENOUS
  Filled 2014-01-15: qty 0.25

## 2014-01-15 NOTE — Clinical Social Work Placement (Addendum)
Clinical Social Work Department CLINICAL SOCIAL WORK PLACEMENT NOTE 01/15/2014  Patient:  Michelle [name], Michelle [name]  Account Number:  [id number] Admit date:  01/03/2014  Clinical Social Worker:  Vivi Barrack, LCSWA  Date/time:  01/12/2014 07:32 AM  Clinical Social Work is seeking post-discharge placement for this patient at the following level of care:   SKILLED NURSING   (*CSW will update this form in Epic as items are completed)   01/10/2014  Patient/family provided with [facility] Department of Clinical Social Work's list of facilities offering this level of care within the geographic area requested by the patient (or if unable, by the patient's family).  01/10/2014  Patient/family informed of their freedom to choose among providers that offer the needed level of care, that participate in Medicare, Medicaid or managed care program needed by the patient, have an available bed and are willing to accept the patient.  01/10/2014  Patient/family informed of [facility]' ownership interest in [facility], as well as of the fact that they are under no obligation to receive care at this facility.  PASARR submitted to EDS on  PASARR number received on   FL2 transmitted to all facilities in geographic area requested by pt/family on  01/12/2014 FL2 transmitted to all facilities within larger geographic area on   Patient informed that his/her managed care company has contracts with or will negotiate with  certain facilities, including the following:     Patient/family informed of bed offers received:  01/14/2014 Patient chooses bed at [facility] Physician recommends and patient chooses bed at    Patient to be transferred to [facility] on  01/15/2014 Patient to be transferred to facility by Ambulance Patient and family notified of transfer on 01/15/2014 Name of family member notified:  Jerl Mina  The following physician request were entered in  Epic:   Additional Comments: Per MD patient ready for DC to [facility]. RN, patient, patient's family, and facility notified of DC. RN given number for report. DC packet on chart. [facility] transport requested for patient. CSW signing off.    UPDATE: RN will call for transport for patient. Transport numbers provided. CSW signing off.    Roddie Mc MSW, [place], [place], [phone]

## 2014-01-15 NOTE — Discharge Summary (Signed)
Name: Michelle [name] MRN: [name] DOB: 01/10/1961 53 y.o. PCP: Michelle BameNora Sadek, MD  Date of Admission: 01/03/2014  8:41 PM Date of Discharge: 01/15/2014 Attending Physician: Michelle SpainElizabeth A Butcher, MD  Discharge Diagnosis: 1. Sepsis due to MSSA Bacteremia: Continue Ancef 1g IV q12.   2. HTN management: Her ACEi and lasix were held due to renal function. She will likely benefit from starting clonidine patch after her discharge for increased medication comliance. Consider switching labetalol to metoprolol ER in outpatient setting.   3. Anemia: continue giving Aranesp 200mcg every 2-3 weeks.   4. Seizure disorder: She was kept on Keppra home dosing, started on Vimpat, but Dilantin was discontinued as she had supratherapeutic level.   Active Problems:   HIV disease   CKD (chronic kidney disease) stage 4, GFR 15-29 ml/min   Diabetes   HCV (hepatitis C virus)   Metabolic acidosis, increased anion gap   Altered mental state   Hepatic encephalopathy   Ascites   Fever, unspecified  Discharge Medications:   Medication List    STOP taking these medications       DILANTIN 100 MG ER capsule  Generic drug:  phenytoin     furosemide 80 MG tablet  Commonly known as:  LASIX     hydrALAZINE 50 MG tablet  Commonly known as:  APRESOLINE     insulin glargine 100 UNIT/ML injection  Commonly known as:  LANTUS     lamiVUDine 150 MG tablet  Commonly known as:  EPIVIR  Replaced by:  lamiVUDine 10 MG/ML solution     lisinopril 40 MG tablet  Commonly known as:  PRINIVIL,ZESTRIL     potassium chloride 10 MEQ tablet  Commonly known as:  K-DUR,KLOR-CON      TAKE these medications       abacavir 300 MG tablet  Commonly known as:  ZIAGEN  Take 600 mg by mouth daily.     acetaminophen 325 MG tablet  Commonly known as:  TYLENOL  Take 2 tablets (650 mg total) by mouth every 6 (six) hours as needed (Fever).     acyclovir 800 MG tablet  Commonly known as:  ZOVIRAX  Take 800 mg by mouth daily.      aspirin EC 81 MG tablet  Take 81 mg by mouth daily.     ceFAZolin 1-5 GM-%  Commonly known as:  ANCEF  Inject 50 mLs (1 g total) into the vein every 12 (twelve) hours.     cloNIDine 0.3 MG tablet  Commonly known as:  CATAPRES  Take 0.3 mg by mouth 3 (three) times daily.     hydrocerin Crea  Apply 1 application topically 2 (two) times daily.     hydrOXYzine 10 MG tablet  Commonly known as:  ATARAX/VISTARIL  Take 1 tablet (10 mg total) by mouth 3 (three) times daily as needed for itching.     insulin aspart 100 UNIT/ML injection  Commonly known as:  novoLOG  Inject 10-25 Units into the skin 3 (three) times daily before meals. Based on sliding scale     labetalol 300 MG tablet  Commonly known as:  NORMODYNE  Take 600 mg by mouth 2 (two) times daily.     Lacosamide 150 MG Tabs  Take 1 tablet (150 mg total) by mouth 2 (two) times daily.     lactulose 10 GM/15ML solution  Commonly known as:  CHRONULAC  Take 30 mLs (20 g total) by mouth 3 (three) times daily.  lamiVUDine 10 MG/ML solution  Commonly known as:  EPIVIR  Take 10 mLs (100 mg total) by mouth daily.     levETIRAcetam 500 MG tablet  Commonly known as:  KEPPRA  Take 1,500 mg by mouth 2 (two) times daily.     pantoprazole 40 MG tablet  Commonly known as:  PROTONIX  Take 1 tablet (40 mg total) by mouth daily.     PREZISTA 800 MG tablet  Generic drug:  Darunavir Ethanolate  Take 800 mg by mouth daily with breakfast.     ritonavir 100 MG capsule  Commonly known as:  NORVIR  Take 100 mg by mouth daily with breakfast.     sodium bicarbonate 650 MG tablet  Take 1 tablet (650 mg total) by mouth 2 (two) times daily.        Disposition and follow-up:   Michelle [name] was discharged from [facility] in Good condition.  At the hospital follow up visit please address:  1.  ID:  Assess compliance with IV Ancef for 4 weeks (end date: September 7th). She will also need PICC line removed after  cleared by ID.              Consider initiating treatment for Hep C  Nephrology:  Reassess BP--her hydralazine had been discontinued but she may need this medication again. Her lisinopril had been discontinued as well. She may benefit from clonidine patch and metoprolol ER (v labetalol BID) for increased compliance in the outpatient setting.        Reassess volume status-- her Lasix was held on discharge      She will need Aranesp every 2-3 weeks for her anemia  PCP: She may need increase in Lantus depending on her CBGs.    2.  Labs / imaging needed at time of follow-up: maybe CBC or CMP  3.  Pending labs/ test needing follow-up: BC from 8/14 which had NGTD at the time of discharge.   Follow-up Appointments: Follow-up Information   Follow up with Judyann Munson, MD On 01/28/2014. (10:45AM)    Specialty:  Infectious Diseases   Contact information:   [address] [place] [address] [phone]       Follow up with Ky Barban, MD On 02/04/2014. (3:15PM)    Specialty:  Internal Medicine   Contact information:   [address] [place] [address] [phone]       Follow up with Arita Miss, MD On 01/21/2014. (8:15AM)    Specialty:  Nephrology   Contact information:   [address] [place] [address] [phone]       Discharge Instructions:   Consultations: Treatment Team:  Maree Krabbe, MD, Neprhology Infectious Disease Neurology   Procedures Performed:  Ct Abdomen Pelvis Wo Contrast  01/04/2014   CLINICAL DATA:  Distended abdomen, jaundice, difficulty urinating, altered mental status  EXAM: CT ABDOMEN AND PELVIS WITHOUT CONTRAST  TECHNIQUE: Multidetector CT imaging of the abdomen and pelvis was performed following the standard protocol without IV contrast.  COMPARISON:  Radiographs 12/17/2013  FINDINGS: Bilateral subsegmental atelectasis at the lung bases. No pleural or pericardial effusion.  Mildly nodular contour to the  liver. Gallbladder is normal. Noncontrast appearance suggests the possibility of an element periportal edema. Small volume of ascites around the liver.  Spleen is normal. Pancreas is normal. Adrenal glands are normal. Kidneys are normal. Abdominal aorta shows minimal calcification with no distention.  Bladder is normal.  Reproductive organs are normal.  Stomach is decompressed which exaggerates gastric wall thickness. Allowing for this, gastric wall appears mildly thickened nonetheless. Small bowel is normal. Evaluation of the colon is difficult without IV contrast, with oral contrast not having reached the colon. The colon is not a dilated, but the does appear to be diffuse colonic wall thickening. Mild to moderate inflammatory change tracks along the entire colon.  There are no acute musculoskeletal abnormalities.  IMPRESSION: 1. Small volume of ascites. Mildly nodular liver contour. Findings suggest possibility of cirrhosis. 2. Stomach is decompressed but shows evidence of wall thickening. Possible gastritis. 3. Evidence of mild diffuse colon wall thickening with inflammatory change surrounding the entire colon. Diffuse colitis suspected.   Electronically Signed   By: Esperanza Heir M.D.   On: 01/04/2014 01:26   Dg Chest 2 View  01/05/2014   CLINICAL DATA:  Fever, cough, chest pain and shortness of breath.  EXAM: CHEST  2 VIEW  COMPARISON:  Chest x-[place] 12/14/2013.  FINDINGS: Linear opacity in the lower left lung is unchanged, compatible with an area of mild scarring. Right internal jugular single-lumen power porta cath with tip terminating in the distal superior vena cava is unchanged. Lung volumes are low. No consolidative airspace disease. No pleural effusions. No pneumothorax. No pulmonary nodule or mass noted. Pulmonary vasculature and the cardiomediastinal silhouette are within normal limits.  IMPRESSION: 1. Low lung volumes without radiographic evidence of acute cardiopulmonary disease. 2. Mild scarring  in the inferior aspect of the left lung, similar prior examinations.   Electronically Signed   By: Trudie Reed M.D.   On: 01/05/2014 19:15   Ct Head Wo Contrast  01/04/2014   CLINICAL DATA:  Lethargy and altered mental status.  EXAM: CT HEAD WITHOUT CONTRAST  TECHNIQUE: Contiguous axial images were obtained from the base of the skull through the vertex without intravenous contrast.  COMPARISON:  CT of the head performed 10/23/2013  FINDINGS: There is no evidence of acute infarction, mass lesion, or intra- or extra-axial hemorrhage on CT.  Prominence of the ventricles and sulci suggests mild cortical volume loss. Scattered periventricular white matter change likely reflects small vessel ischemic microangiopathy.  The brainstem and fourth ventricle are within normal limits. The basal ganglia are unremarkable in appearance. The cerebral hemispheres demonstrate grossly normal gray-white differentiation. No mass effect or midline shift is seen.  There is no evidence of fracture; visualized osseous structures are unremarkable in appearance. There is apparent mild bilateral proptosis. The paranasal sinuses and mastoid air cells are well-aerated. No significant soft tissue abnormalities are seen.  IMPRESSION: 1. No acute intracranial pathology seen on CT. 2. Mild cortical volume loss and scattered small vessel ischemic microangiopathy. 3. Apparent mild bilateral proptosis noted.   Electronically Signed   By: Roanna Raider M.D.   On: 01/04/2014 01:25   Ct Soft Tissue Neck Wo Contrast  12/29/2013   CLINICAL DATA:  53 year old HIV positive female with palatal swelling, drooling and decreased ability to swallow.  EXAM: CT NECK WITHOUT CONTRAST  TECHNIQUE: Multidetector CT imaging of the neck was performed following the standard protocol without intravenous contrast.  COMPARISON:  Prior head CT 10/23/2013  FINDINGS: Extremely limited evaluation in the absence of intravenous contrast. The visualized intracranial  contents demonstrate no acute abnormality. The visualized paranasal sinuses and mastoid air cells are normally aerated. Evaluation of the palate is limited by streak artifact related to multiple dental amalgams.  High palatal arch. There is some asymmetric soft tissue attenuation material  low just left of midline at the junction of the heart and soft palate. In the absence image venous contrast material this is very nonspecific. This could represent tenacious protein ridge secretions, blood products, or a soft tissue mass. This region should be amenable to direct inspection. No invasion into the surrounding soft tissues. Otherwise, the palatal and faucial tonsils are symmetric. The uvula is unremarkable as is the epiglottis and airway. Unremarkable parotid and salivary glands. No focal soft tissue fluid collection. Right IJ approach port catheter incompletely imaged. No suspicious adenopathy.  Unremarkable upper lungs.  IMPRESSION: 1. Nonspecific 1.5 x 0.8 cm soft tissue attenuation structure exophytic from the soft palate just left of midline. In the absence of intravenous contrast for complete evaluation this could represent proteinaceous secretions, blood products or a soft tissue mass. This region should be amenable to direct visualization. No evidence of invasion into the adjacent soft tissues or bony structures. 2. Otherwise, unremarkable noncontrast appearance of the soft tissues of the neck. 3. Incompletely imaged right IJ approach portacatheter.   Electronically Signed   By: Malachy Moan M.D.   On: 12/29/2013 10:33   Mr Thoracic Spine Wo Contrast  01/11/2014   CLINICAL DATA:  Evaluate fever of unknown origin.  EXAM: MRI THORACIC AND LUMBAR SPINE WITHOUT CONTRAST  TECHNIQUE: Multiplanar and multiecho pulse sequences of the thoracic and lumbar spine were obtained without intravenous contrast.  COMPARISON:  CT of the abdomen and pelvis January 14, 2014  FINDINGS: MR THORACIC SPINE FINDINGS  Thoracic  vertebral bodies and posterior elements are intact and aligned with maintenance of thoracic kyphosis. Sub cm bright T1 and bright T2 hemangioma T7. No abnormal STIR signal. Intervertebral discs demonstrate normal morphology and signal characteristics.  The ventral thoracic spinal cord deformity due to disc protrusion as described below, the spinal cord is overall normal in morphology and signal characteristics the conus medullaris which terminates at T12-L1. No abnormal epidural fluid collections. Partially imaged small bilateral pleural effusions. Mild subcutaneous dependent edema within the lower thoracic spine.  Minimal annular bulging at C6-7. At T7-8 is moderate left central disc protrusion with possible annular fissure resulting in mild to moderate canal stenosis, left ventral spinal cord deformity. Possible small left subarticular to extra foraminal T8-9 disc protrusion resulting in moderate left T8-9 neural foraminal narrowing.  MR LUMBAR SPINE FINDINGS  Lumbar vertebral bodies and posterior elements are intact and aligned and maintenance of lumbar lordosis. Intervertebral discs demonstrate normal morphology and signal characteristics. No abnormal bone marrow signal, specifically no bright STIR signal to suggest acute osseous process.  Conus medullaris terminates at T12-L1 and appears normal in morphology and signal characteristics. 1 mm bright T1 signal along the filum terminale may reflect fibrolipomatous changes without cord tethering. Mild paraspinal muscle atrophy. Dependent subcutaneous edema and grade 1 paraspinal muscle strain.  Level by level evaluation:  T12-L1, L1-2, L2-3, L3-4 and L4-5: No disc bulge. Mild facet arthropathy without canal stenosis or neural foraminal narrowing.  L5-S1: No disc bulge. Moderate facet arthropathy. No canal stenosis. Mild neural foraminal narrowing.  IMPRESSION: MR THORACIC SPINE IMPRESSION  No findings of discitis, osteomyelitis nor epidural abscess on this  nonenhanced examination.  Thoracic spondylosis, including T7-8 moderate left central disc protrusion resulting in mild to moderate canal stenosis. Possible left subarticular and T8-9 small disc protrusion resulting in moderate left T8-9 neural foraminal narrowing.  MR LUMBAR SPINE IMPRESSION  No findings of discitis, osteomyelitis nor epidural abscess on this nonenhanced examination.  Grade 1 paraspinal muscle strain.  Facet arthropathy without neurocompressive changes.   Electronically Signed   By: Awilda Metro   On: 01/11/2014 00:13   Ct Abdomen Pelvis Wo Contrast  01/11/2014   CLINICAL DATA:  Sepsis.  Fever.  EXAM: CT ABDOMEN AND PELVIS WITHOUT CONTRAST  TECHNIQUE: Multidetector CT imaging of the abdomen and pelvis was performed following the standard protocol without IV contrast.  COMPARISON:  01/04/2014  FINDINGS: Increasing dependent bibasilar opacities, likely atelectasis. Heart is normal size. Trace effusions present.  Subtle nodular contours of the liver are again noted suggesting cirrhosis. No visible focal abnormality on this unenhanced study. Spleen, pancreas, kidneys have an unremarkable unenhanced appearance. Right adrenal unremarkable. Mild diffuse enlargement of the left renal gland compatible with hyperplasia.  Small amount of free fluid in the cul-de-sac. Uterus, adnexae and urinary bladder are unremarkable. Stomach, large and small bowel are grossly unremarkable. Appendix is visualized and is normal. No free air or adenopathy. Aorta is normal caliber.  No acute bony abnormality or focal bone lesion.  IMPRESSION: Increasing bibasilar densities, likely atelectasis. Suspect trace effusions.  Mildly nodular contours of the liver again noted suggesting cirrhosis.  Trace free fluid in the pelvis.  No acute findings.   Electronically Signed   By: Charlett Nose M.D.   On: 01/11/2014 00:05    US Abdomen Complete  01/08/2014   CLINICAL DATA:  Right upper quadrant pain with elevated hepatic  function studies and history of cirrhosis, HIV and diabetes. Evaluate for ascites.  EXAM: ULTRASOUND ABDOMEN COMPLETE  COMPARISON:  Renal ultrasound of December 18, 2013  FINDINGS: Gallbladder:  The gallbladder is adequately distended with no evidence of stones. There is no positive sonographic Murphy's sign. There is no pericholecystic fluid.  Common bile duct:  Diameter: 3.3 mm  Liver:  There is no focal mass or ductal dilation. Portal venous flow is normal in direction toward the liver.  IVC:  No abnormality visualized.  Pancreas:  Visualized portion unremarkable.  Spleen:  The spleen exhibits normal echotexture and measures 9.5 cm in greatest dimension  Right Kidney:  Length: 10.1 cm. The echotexture of the right kidney is increased. There is no focal mass or hydronephrosis.  Left Kidney:  Length: 10.9 cm. The cortical echotexture of the left kidney is also mildly increased. There is no hydronephrosis or focal mass. 2.5 cm  Abdominal aorta:  No aneurysm visualized.  Other findings:  No ascites is demonstrated.  IMPRESSION: 1. Increased echotexture both kidneys is consistent with medical renal disease. There is no hydronephrosis. 2. The liver, gallbladder, pancreas, and common bile duct exhibit no acute abnormalities. 3. No ascites is demonstrated.   Electronically Signed   By: David  [place]   On: 01/08/2014 14:41   Ir Removal Tun Access W/ Port W/o Fl Mod Sed  01/07/2014   CLINICAL DATA:  Infected right internal jugular vein Port-A-Cath.  EXAM: REMOVAL RIGHT IJ VEIN PORT-A-CATH  IMPRESSION: Successful removal of a Port-A-Cath for infection. The pocket was packed with iodoform which will need to be retracted an inch every day. The wound should hopefully close by secondary intention.   Electronically Signed   By: Maryclare Bean M.D.   On: 01/07/2014 12:02   Ir US Guide Vasc Access Right  01/14/2014   CLINICAL DATA:  HIV  EXAM: RIGHT INTERNAL JUGULAR TUNNELED PICC LINE PLACEMENT WITH ULTRASOUND AND FLUOROSCOPIC  GUIDANCE  FLUOROSCOPY TIME:  12 seconds.   IMPRESSION: Successful right internal jugular Power tunneled PICC line placement with ultrasound and fluoroscopic guidance. The  catheter is ready for use.   Electronically Signed   By: Maryclare Bean M.D.   On: 01/14/2014 12:08    2D Echo:  01/08/14: Study Conclusions  - Left ventricle: The cavity size was normal. There was mild concentric hypertrophy. Systolic function was normal. The estimated ejection fraction was in the range of 60% to 65%. Wall motion was normal; there were no regional wall motion abnormalities. Doppler parameters are consistent with abnormal left ventricular relaxation (grade 1 diastolic dysfunction). Doppler parameters are consistent with elevated ventricular end-diastolic filling pressure. - Aortic valve: There is focal thickening of the tip of the left coronary leaflet. There was mild regurgitation. - Aortic root: The aortic root was normal in size. - Mitral valve: There was mild regurgitation. - Left atrium: The atrium was mildly dilated. - Tricuspid valve: There was no regurgitation. - Pulmonary arteries: Systolic pressure was moderately increased. PA peak pressure: 47 mm Hg (S).  Impressions:  - No endocarditis was identified. Focal thickening of the left coronary leaflet of the aortic valve is unchanged from the prior study from 05/31/2013.   TEE:  01/10/14: Study Conclusions  - Left ventricle: There was mild concentric hypertrophy. Systolic function was normal. The estimated ejection fraction was in the range of 55% to 60%. Wall motion was normal; there were no regional wall motion abnormalities. - Aortic valve: Mild focal calcification involving the left coronary cusp. There was trivial regurgitation. - Left atrium: No evidence of thrombus in the atrial cavity or appendage. - Right atrium: No evidence of thrombus in the atrial cavity or appendage. No evidence of thrombus in the atrial cavity  or appendage. - Tricuspid valve: There was mild-moderate regurgitation directed centrally. - Pulmonary arteries: PA peak pressure: 40 mm Hg (S).  Impressions: - There was no evidence of a vegetation.   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 She states that she has been taking her lactulose with last BM the morning of her presentation. She denies increased somnolence and reports being hungry. She also denies increased abdominal distention or abdominal pain.   Hospital Course by problem list: Hepatic Encephalopathy: She arrived lethargic which was concerning for acute hepatic encephalopathy. Her ammonia level on admission was 127 and increased to 161, but she clinically appeared to be at baseline mental status on 01/05/14 and continued to be at baseline for the rest of her hospitalization. She received home lactulose PO PRN titrated to 3BM/day and will continue this medication after her discharge.   Sepsis due to MSSA: Michelle [name] had a fever with Tmax 102.30F on 8/9 with blood cultures that grew MSSA in 2/2 vials. Vancomycin was started on 8/10 and discontinued 2 days later per sensitivities. Cefazolin 2g IV was started on 8/10 and will need to be continued for 4 weeks (September 7th). The source of her bacteremia was likely her port-a-cath placed on 12/05/13 which was removed on 8/11. She continued to spike fevers to Tmax of 102.4 until the eve of 8/13, but she remained afebrile subsequently. A TEE was performed on 8/14 and showed no concerns for infective endocarditis and she did not have a murmur on physical exam. MRI lumbar/thoracic spine negative for epidural abscess. CT abd/pelvis negative for intraabdominal process. She will go to SNF for continued IV antibiotic  infusion and will have follow up with  infectious disease prior to finishing her antibiotic treatment.   Acute on chronic Anemia: Baseline Hgb of 6-8. Michelle [name]'s Hgb dropped to 6.8 on 8/14, she received 1 unit of pRBC but her post-transfusion CBC was 6.6. In the past, she had an extensive workup for concerns of hemolytic anemia including: Coombs+ with negative IgG Coombs (elution was IgG positive agglutionation) but Wilmer Floor antibody negative. GI also performed an EGD and colonoscopy on 7/23 for +FOBT which showed nodular gastric mucosa and normal colon, terminal ileum which yielded benign pathology of the colon, ileum, and duodenum with minimal chronic inflammation. She had a bone marrow bx on 7/2 that was inconclusive and non-diagnostic. She is on ART which can cause suppression of all cell lines. She received an Aranesp injection recently per Dr. Sibyl Parr in Nephrology, which seemed to have maintained her Hgb stable for a while. She was further evaluated in the inpatient setting by Dr. Cyndie Chime in Hematology/Oncology who recommended pRBC transfusion only if she is symptomatic . She was given Aranesp on 8/15 and the rest of her dose of on 8/18, as she should get 200-353mcg every 2-3 weeks. Her Hgb remains at 6.4 on the day of her discharge but she is asymptomatic with no signs or symptoms of bleeding. Aranesp effects should be seen in a few weeks. She will follow up with Nephrology on August 25th and will likely receive another Aranesp injection then.   Acute on Chronic Renal Failure: Her Cr bumped to 3.44 on 8/11 but improved to 2.72 today. Her baseline Cr in the 2.5-2.8 range. Her initial Cr spike was likely multifactorial from furosemide use, ACEi use, supertherapeutic dilantin levels, and possibly Vancomycin use. We discontinued her dilantin on admission and held her ACEi and furosemide. Nephrology was following and recommended reducing BP medications to clonipine 0.2mg  bid and labetalol 300mg  bid in order to  improve renal perfusion. However, due to persistent increase in her blood pressure with SPBs to 200s her clonidine was increased back to her home dose of 0.3mg  BID, and her labetalol was increased to 600mg  BID. GFR on 8/17 was 14, so IV bicarb was started and her IV cefazolin dose was decreased from 2g BID to 1g BID. Her Cr on discharge is 2.72 and GFR is 22.   HTN: She has had ANA positive with titers that increased from 1:40 to 1:80 after use of hydralazine. After discussing these findings with Dr. Arlean Hopping in Nephrology we discontinued the hydralazine and ANA titers should be rechecked in 1 month with her PCP follow up. Her lisinopril and Lasix were held during this hospitalization due to her renal function. Her labetalol and clonidine were reduced by 50% to promote renal profusion but had to be increased again to her home dose.  She was allowed to be permissible hypertensive, but was given one time doses of hydralazine and clonidine when systolic BP >190 and/or diastolic BP >100. We recommend her clonidine to be changed to a transdermal patch for better home compliance once her BP is better controlled. We also recommend switching her form labetalol to metoprolol ER in the outpatient setting for better compliance. She will follow up with Nephrology for further BP management.   Ascites: Our concern for SBP was low. She had a soft mildly distended abdomen on physical exam throughout hospitalization. CT abdomen/pelvis on presentation showed only mild ascites and no acute intraabdominal process. She had mild RUQ pain on 8/12, a day after port-a-cath  removal, but [place] Abd was unremarkable for acute gall bladder disease. CT/Abd pelvis with no acute intraabdominal process. Bilirubin also peaked to 6.1 on 8/12 but has been downtrending. Total bilirubin has been down trending and at discharge is 3.5.   History of seizure: Initial presentation of lethargy and history of seizures resulted in checking a phenytoin level,  which was supertherapeutic on admission at a corrected level of 25. This may have precipitated her somnolence, thus it was held. Due to worsening renal function, dilantin was eventually discontinued with lacosamide added as a second agent per Neurology recommendations. 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 She has remained on her renally adjusted doses of keppra and vimpat with no recurrent seizure-like activity.   Hepatitis C: Complicated by liver cirrhosis. Genotype in December 2014. Genotype 1b, viral load of [id number], Log of 6.63. She will follow up with Dr. Drue Second in [place] for possible treatment of Hep C.   HIV disease: Well controlled. Due to dilantin level being super therapeutic and the possibility that it can interact with antiretrovirals, we repeated a CD4 count and viral load check. On 8/16, her CD4 was 240 and viral load was undetectable. She remained on her home abacavir, prezista, norvir, and epivir as well as her acyclovir for prophylaxis.   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 K on discharge is 3.6.   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 Secondary to Hepatitis C infection. 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 Platelets and INR remained stable.   DMII: Last Hg A1C 7.5% on 11/13/13. On Lantus 30 u qHS and Novolog 10-25 u actid at home. Was on Lantus 20 units qHS in the hospital and sensitive SSI but with CBGs in up 200s. We recommend that she continue SSI sensitive and resume her home dose of Lantus at Rehab facility.     Discharge Vitals:   BP 199/74  Pulse 75  Temp(Src) 98.2 F (36.8 C) (Oral)  Resp 20  Ht 5\' 5"  (1.651 m)  Wt 179 lb 14.3 oz (81.6 kg)  BMI 29.94 kg/m2  SpO2 95%  Discharge Labs:  Results for orders placed during the hospital encounter of  01/03/14 (from the past 24 hour(s))  GLUCOSE, CAPILLARY     Status: Abnormal   Collection Time    01/14/14  4:41 PM      Result Value Ref Range   Glucose-Capillary 285 (*) 70 - 99 mg/dL  GLUCOSE, CAPILLARY     Status: Abnormal   Collection Time    01/14/14  9:59 PM      Result Value Ref Range   Glucose-Capillary 275 (*) 70 - 99 mg/dL   Comment 1 Documented in Chart     Comment 2 Notify RN    CBC     Status: Abnormal   Collection Time    01/15/14  5:00 AM      Result Value Ref Range   WBC 8.5  4.0 - 10.5 K/uL   RBC 1.85 (*) 3.87 - 5.11 MIL/uL   Hemoglobin 6.4 (*) 12.0 - 15.0 g/dL   HCT 40.9 (*) 81.1 - 91.4 %   MCV 106.5 (*) 78.0 - 100.0 fL   MCH 34.6 (*) 26.0 - 34.0 pg   MCHC 32.5  30.0 - 36.0 g/dL   RDW 78.2  95.6 - 21.3 %  Platelets 225  150 - 400 K/uL  COMPREHENSIVE METABOLIC PANEL     Status: Abnormal   Collection Time    01/15/14  5:00 AM      Result Value Ref Range   Sodium 137  137 - 147 mEq/L   Potassium 3.6 (*) 3.7 - 5.3 mEq/L   Chloride 101  96 - 112 mEq/L   CO2 22  19 - 32 mEq/L   Glucose, Bld 262 (*) 70 - 99 mg/dL   BUN 44 (*) 6 - 23 mg/dL   Creatinine, Ser 1.61 (*) 0.50 - 1.10 mg/dL   Calcium 7.6 (*) 8.4 - 10.5 mg/dL   Total Protein 6.0  6.0 - 8.3 g/dL   Albumin 1.2 (*) 3.5 - 5.2 g/dL   AST 62 (*) 0 - 37 U/L   ALT <5  0 - 35 U/L   Alkaline Phosphatase 669 (*) 39 - 117 U/L   Total Bilirubin 3.5 (*) 0.3 - 1.2 mg/dL   GFR calc non Af Amer 19 (*) >90 mL/min   GFR calc Af Amer 22 (*) >90 mL/min   Anion gap 14  5 - 15  GLUCOSE, CAPILLARY     Status: Abnormal   Collection Time    01/15/14  7:52 AM      Result Value Ref Range   Glucose-Capillary 278 (*) 70 - 99 mg/dL  GLUCOSE, CAPILLARY     Status: Abnormal   Collection Time    01/15/14 11:57 AM      Result Value Ref Range   Glucose-Capillary 305 (*) 70 - 99 mg/dL    Signed: Ky Barban, MD 01/15/2014, 3:53 PM    Services Ordered on Discharge: None Equipment Ordered on Discharge: 3:1  bedside commode

## 2014-01-15 NOTE — Progress Notes (Signed)
NUTRITION FOLLOW UP  Intervention:   Continue with current plan of care  Nutrition Dx:   Inadequate oral intake related to inability to eat as evidenced by NPO status; resolved  Goal:   Pt to meet >/= 90% of their estimated nutrition needs; met  Monitor:   PO intake, weight, labs, I/Os  Assessment:   53 year old female with history of CKD stage 4, DM, HIV, hepatitis C, brought to the ED with increasing lethargy for the last 24 hours.  Appetite remains good; PO: 100%. She reports her appetite has improved since last visit. She is in good spirits, reporting that she is going to be discharged to a rehab facility once medically stable. Discussed importance of good PO intake to continue to promote healing process. Pt grateful for RD visit. She verbalized no further nutrition needs or questions at this time. Noted 5# wt gain x 1 week, likely due to edema.  Labs reviewed. Glucose: 262. CBGS: 275-285. Mg and Phos WNL. K: 3.6.  Height: Ht Readings from Last 1 Encounters:  01/07/14 _0  (1.651 m)    Weight Status:   Wt Readings from Last 1 Encounters:  01/15/14 179 lb 14.3 oz (81.6 kg)    Re-estimated needs:  Kcal: 1800-2000 Protein: 65-82 grams protein Fluid: 1.8-2.0 L  Skin: ecchymosis on arms and legs, excoriation on lt upper chest  Diet Order: Diabetic, renal with 1200 ml fluid restriction   Intake/Output Summary (Last 24 hours) at 01/15/14 0947 Last data filed at 01/15/14 0330  Gross per 24 hour  Intake   1215 ml  Output      0 ml  Net   1215 ml    Last BM: 01/14/14   Labs:   Recent Labs Lab 01/08/14 2340  01/13/14 0539 01/14/14 0545 01/15/14 0500  NA 136*  < > 134* 137 137  K 3.5*  < > 3.6* 3.4* 3.6*  CL 103  < > 103 103 101  CO2 16*  < > 14* 20 22  BUN 49*  < > 57* 50* 44*  CREATININE 2.77*  < > 3.37* 2.97* 2.72*  CALCIUM 7.2*  < > 7.9* 7.7* 7.6*  MG 1.7  --   --  1.7  --   PHOS 3.2  --   --   --   --   GLUCOSE 256*  < > 212* 242* 262*  < > = values  in this interval not displayed.  CBG (last 3)   Recent Labs  01/14/14 1641 01/14/14 2159 01/15/14 0752  GLUCAP 285* 275* 278*    Scheduled Meds: . sodium chloride   Intravenous Once  . abacavir  300 mg Oral BID  . acyclovir  800 mg Oral Daily  . aspirin EC  81 mg Oral Daily  .  ceFAZolin (ANCEF) IV  1 g Intravenous Q12H  . cloNIDine  0.3 mg Oral BID  . Darunavir Ethanolate  800 mg Oral Q breakfast  . enoxaparin (LOVENOX) injection  30 mg Subcutaneous Q24H  . hydrocerin   Topical BID  . insulin aspart  0-9 Units Subcutaneous TID WC  . insulin glargine  20 Units Subcutaneous QHS  . labetalol  300 mg Oral BID  . lacosamide  150 mg Oral BID  . lactulose  20 g Oral TID  . lamiVUDine  50 mg Oral Daily  . levETIRAcetam  500 mg Oral BID  . magic mouthwash w/lidocaine  5 mL Oral TID  . ritonavir  100 mg Oral  Q breakfast  . sodium bicarbonate  650 mg Oral BID  . sodium chloride  3 mL Intravenous Q12H    Continuous Infusions: .  sodium bicarbonate 150 mEq in sterile water 1000 mL infusion 150 mEq (01/15/14 0331)    Nike Southers A. Jimmye Norman, RD, LDN Pager: [phone] After hours Pager: [phone]

## 2014-01-15 NOTE — Progress Notes (Signed)
Patient was discharged to nursing home [facility] [place]) by MD order; discharged instructions  review and sent to facility  with care notes ; IV DIC; PICC line in place on right internal jugular, patent; skin intact; patient will be transported to facility by EMS. Facility was called and report was given to the nurse.

## 2014-01-15 NOTE — Progress Notes (Signed)
  I have seen and examined the patient, and reviewed the daily progress note by Lillia Carmel, MS 4 and discussed the care of the patient with them. Please see my progress note from 01/15/2014 for further details regarding assessment and plan.    Signed:  Ky Barban, MD 01/15/2014, 5:03 PM

## 2014-01-15 NOTE — Progress Notes (Signed)
Subjective: Michelle [name] remains hypertensive this morning, but does not complain of headache, chest pain, or shortness of breath. She has been eating and drinking well and does not complain of abd pain or changes in bowel movements. She does continue to have vaginal itching although she feels like this has improved. She remains afebrile at 98.2. She is happy to be going to a SNF.   Objective: Vital signs in last 24 hours: Filed Vitals:   01/15/14 0036 01/15/14 0221 01/15/14 0257 01/15/14 0529  BP: 215/79 221/75 167/68 199/77  Pulse: 77 74 69 79  Temp:    98.2 F (36.8 C)  TempSrc:    Oral  Resp:    22  Height:      Weight:    81.6 kg (179 lb 14.3 oz)  SpO2:    96%   Weight change: 0 kg (0 lb)  Intake/Output Summary (Last 24 hours) at 01/15/14 1126 Last data filed at 01/15/14 0330  Gross per 24 hour  Intake   1215 ml  Output      0 ml  Net   1215 ml   Physical Exam:  Gen; Sitting up in bed in NAD, conversant  HEENT: mild scleral icterus, MMM, lower incisors missing CV: RRR, no murmurs, rubs, or gallops Chest: Left chest with ecchymosis from peripheral IV that continues to improve, ecchymosis extends to her left arm, tender but non-erythematous.  Right chest port-a-cath removal site has no erythema, covered with clean, dry dressing. Resp: CTAB, normal respiratory effort, no wheezing  Abd: soft, bowel sounds present, mildly distended, non tender, no caput medusa Ext: no peripheral edema, dry skin Neuro: Alert and oriented x3, no slurred speech, no asterixis  Lab Results: Basic Metabolic Panel:  Recent Labs Lab 01/08/14 2340  01/14/14 0545 01/15/14 0500  NA 136*  < > 137 137  K 3.5*  < > 3.4* 3.6*  CL 103  < > 103 101  CO2 16*  < > 20 22  GLUCOSE 256*  < > 242* 262*  BUN 49*  < > 50* 44*  CREATININE 2.77*  < > 2.97* 2.72*  CALCIUM 7.2*  < > 7.7* 7.6*  MG 1.7  --  1.7  --   PHOS 3.2  --   --   --   < > = values in this interval not displayed. Liver Function  Tests:  Recent Labs Lab 01/14/14 0545 01/15/14 0500  AST 59* 62*  ALT <5 <5  ALKPHOS 693* 669*  BILITOT 4.0* 3.5*  PROT 5.8* 6.0  ALBUMIN 1.4* 1.2*   CBC:  Recent Labs Lab 01/09/14 1015 01/10/14 0540  01/14/14 0545 01/15/14 0500  WBC 8.6 7.7  < > 8.6 8.5  NEUTROABS 6.8 5.6  --   --   --   HGB 7.0* 6.8*  < > 6.3* 6.4*  HCT 20.9* 20.4*  < > 19.1* 19.7*  MCV 105.0* 107.9*  < > 108.5* 106.5*  PLT 125* 138*  < > 215 225  < > = values in this interval not displayed. Cardiac Enzymes:  Recent Labs Lab 01/08/14 2340 01/09/14 0612 01/09/14 1153  CKTOTAL  --  59  --   TROPONINI 0.34* <0.30 <0.30   CBG:  Recent Labs Lab 01/14/14 0020 01/14/14 0742 01/14/14 1139 01/14/14 1641 01/14/14 2159 01/15/14 0752  GLUCAP 279* 258* 188* 285* 275* 278*   Coagulation:  Recent Labs Lab 01/12/14 0850  LABPROT 12.7  INR 0.95   Anemia Panel:  Recent Labs  Lab 01/11/14 1057  RETICCTPCT 1.2   Urine Drug Screen: Drugs of Abuse     Component Value Date/Time   LABOPIA NONE DETECTED 01/03/2014 2311   LABOPIA NEG 11/13/2013 1516   COCAINSCRNUR NONE DETECTED 01/03/2014 2311   COCAINSCRNUR NEG 11/13/2013 1516   LABBENZ NONE DETECTED 01/03/2014 2311   LABBENZ NEG 11/13/2013 1516   AMPHETMU NONE DETECTED 01/03/2014 2311   THCU NONE DETECTED 01/03/2014 2311   LABBARB NONE DETECTED 01/03/2014 2311   LABBARB NEG 11/13/2013 1516    Urinalysis:  Recent Labs Lab 01/11/14 1627  COLORURINE AMBER*  LABSPEC 1.019  PHURINE 5.5  GLUCOSEU 100*  HGBUR NEGATIVE  BILIRUBINUR LARGE*  KETONESUR 15*  PROTEINUR >300*  UROBILINOGEN 0.2  NITRITE NEGATIVE  LEUKOCYTESUR SMALL*    Micro Results: Recent Results (from the past 240 hour(s))  CULTURE, BLOOD (ROUTINE X 2)     Status: None   Collection Time    01/05/14  3:30 PM      Result Value Ref Range Status   Specimen Description BLOOD LEFT ARM   Final   Special Requests     Final   Value: BOTTLES DRAWN AEROBIC AND ANAEROBIC 10CC BLUE, 5CC RED    Culture  Setup Time     Final   Value: 01/05/2014 22:48     Performed at [facility]   Culture     Final   Value: STAPHYLOCOCCUS AUREUS     Note: RIFAMPIN AND GENTAMICIN SHOULD NOT BE USED AS SINGLE DRUGS FOR TREATMENT OF STAPH INFECTIONS.     Note: Gram Stain Report Called to,Read Back By and Verified With: Volanda Napoleon RN on 01/06/14 at 06:45 by Christie Nottingham     Performed at [facility]   Report Status 01/08/2014 FINAL   Final   Organism ID, Bacteria STAPHYLOCOCCUS AUREUS   Final  CULTURE, BLOOD (ROUTINE X 2)     Status: None   Collection Time    01/05/14  3:42 PM      Result Value Ref Range Status   Specimen Description BLOOD BLOOD LEFT FOREARM   Final   Special Requests BOTTLES DRAWN AEROBIC ONLY 5CC   Final   Culture  Setup Time     Final   Value: 01/05/2014 22:48     Performed at [facility]   Culture     Final   Value: STAPHYLOCOCCUS AUREUS     Note: SUSCEPTIBILITIES PERFORMED ON PREVIOUS CULTURE WITHIN THE LAST 5 DAYS.     Note: Gram Stain Report Called to,Read Back By and Verified With: Volanda Napoleon RN on 01/06/14 at 06:45 by Christie Nottingham     Performed at [facility]   Report Status 01/08/2014 FINAL   Final  CULTURE, BLOOD (ROUTINE X 2)     Status: None   Collection Time    01/06/14  3:35 PM      Result Value Ref Range Status   Specimen Description BLOOD LEFT ANTECUBITAL   Final   Special Requests     Final   Value: BOTTLES DRAWN AEROBIC AND ANAEROBIC 10CC AER 5CC ANA   Culture  Setup Time     Final   Value: 01/06/2014 19:02     Performed at [facility]   Culture     Final   Value: STAPHYLOCOCCUS AUREUS     Note: RIFAMPIN AND GENTAMICIN SHOULD NOT BE USED AS SINGLE DRUGS FOR TREATMENT OF STAPH INFECTIONS.     Note: Gram Stain Report  Called to,Read Back By and Verified With: Junius Argyle 01/08/14 AT 0100 RIDK     Performed at [facility]   Report Status 01/10/2014 FINAL   Final   Organism ID, Bacteria  STAPHYLOCOCCUS AUREUS   Final  CULTURE, BLOOD (ROUTINE X 2)     Status: None   Collection Time    01/06/14  3:45 PM      Result Value Ref Range Status   Specimen Description BLOOD LEFT HAND   Final   Special Requests     Final   Value: BOTTLES DRAWN AEROBIC AND ANAEROBIC 10CC AER 5CC ANA   Culture  Setup Time     Final   Value: 01/06/2014 19:00     Performed at [facility]   Culture     Final   Value: NO GROWTH 5 DAYS     Performed at [facility]   Report Status 01/13/2014 FINAL   Final  CULTURE, BLOOD (ROUTINE X 2)     Status: None   Collection Time    01/08/14  6:56 AM      Result Value Ref Range Status   Specimen Description BLOOD LEFT HAND   Final   Special Requests BOTTLES DRAWN AEROBIC AND ANAEROBIC 10CC   Final   Culture  Setup Time     Final   Value: 01/08/2014 10:12     Performed at [facility]   Culture     Final   Value: NO GROWTH 5 DAYS     Performed at [facility]   Report Status 01/14/2014 FINAL   Final  CULTURE, BLOOD (ROUTINE X 2)     Status: None   Collection Time    01/08/14  7:05 AM      Result Value Ref Range Status   Specimen Description BLOOD BLOOD LEFT FOREARM   Final   Special Requests BOTTLES DRAWN AEROBIC ONLY [facility]   Final   Culture  Setup Time     Final   Value: 01/08/2014 10:12     Performed at [facility]   Culture     Final   Value: NO GROWTH 5 DAYS     Performed at [facility]   Report Status 01/14/2014 FINAL   Final  CULTURE, BLOOD (ROUTINE X 2)     Status: None   Collection Time    01/10/14 12:47 AM      Result Value Ref Range Status   Specimen Description BLOOD LEFT FOREARM   Final   Special Requests     Final   Value: BOTTLES DRAWN AEROBIC AND ANAEROBIC BLUE 10 CC RED 5 CC   Culture  Setup Time     Final   Value: 01/11/2014 13:10     Performed at [facility]   Culture     Final   Value:        BLOOD CULTURE RECEIVED NO GROWTH TO DATE CULTURE WILL BE HELD FOR 5 DAYS  BEFORE ISSUING A FINAL NEGATIVE REPORT     Performed at [facility]   Report Status PENDING   Incomplete  CULTURE, BLOOD (ROUTINE X 2)     Status: None   Collection Time    01/10/14 12:53 AM      Result Value Ref Range Status   Specimen Description BLOOD LEFT HAND   Final   Special Requests BOTTLES DRAWN AEROBIC AND ANAEROBIC 10 CC   Final   Culture  Setup Time  Final   Value: 01/11/2014 13:10     Performed at [facility]   Culture     Final   Value:        BLOOD CULTURE RECEIVED NO GROWTH TO DATE CULTURE WILL BE HELD FOR 5 DAYS BEFORE ISSUING A FINAL NEGATIVE REPORT     Performed at [facility]   Report Status PENDING   Incomplete   Studies/Results: Ir Fluoro Guide Cv Line Right  01/14/2014   CLINICAL DATA:  HIV  EXAM: RIGHT INTERNAL JUGULAR TUNNELED PICC LINE PLACEMENT WITH ULTRASOUND AND FLUOROSCOPIC GUIDANCE  FLUOROSCOPY TIME:  12 seconds.  PROCEDURE: The patient was advised of the possible risks andcomplications and agreed to undergo the procedure. The patient was then brought to the angiographic suite for the procedure.  The right neck was prepped with chlorhexidine, drapedin the usual sterile fashion using maximum barrier technique (cap and mask, sterile gown, sterile gloves, large sterile sheet, hand hygiene and cutaneous antisepsis) and infiltrated locally with 1% Lidocaine.  Ultrasound demonstrated patency of the right internal jugular vein, and this was documented with an image. Under real-time ultrasound guidance, this vein was accessed with a 21 gauge micropuncture needle and image documentation was performed. A 0.018 wire was introduced in to the vein. Over this, a 5 [place] double lumen Power tunneled PICC was advanced to the lower SVC/right atrial junction. The cuff was positioned in the subcutaneous tract. Fluoroscopy during the procedure and fluoro spot radiograph confirms appropriate catheter position. The catheter was flushed and covered with  asterile dressing.  Complications: None  IMPRESSION: Successful right internal jugular Power tunneled PICC line placement with ultrasound and fluoroscopic guidance. The catheter is ready for use.   Electronically Signed   By: Maryclare Bean M.D.   On: 01/14/2014 12:08   Ir US Guide Vasc Access Right  01/14/2014   CLINICAL DATA:  HIV  EXAM: RIGHT INTERNAL JUGULAR TUNNELED PICC LINE PLACEMENT WITH ULTRASOUND AND FLUOROSCOPIC GUIDANCE  FLUOROSCOPY TIME:  12 seconds.  PROCEDURE: The patient was advised of the possible risks andcomplications and agreed to undergo the procedure. The patient was then brought to the angiographic suite for the procedure.  The right neck was prepped with chlorhexidine, drapedin the usual sterile fashion using maximum barrier technique (cap and mask, sterile gown, sterile gloves, large sterile sheet, hand hygiene and cutaneous antisepsis) and infiltrated locally with 1% Lidocaine.  Ultrasound demonstrated patency of the right internal jugular vein, and this was documented with an image. Under real-time ultrasound guidance, this vein was accessed with a 21 gauge micropuncture needle and image documentation was performed. A 0.018 wire was introduced in to the vein. Over this, a 5 [place] double lumen Power tunneled PICC was advanced to the lower SVC/right atrial junction. The cuff was positioned in the subcutaneous tract. Fluoroscopy during the procedure and fluoro spot radiograph confirms appropriate catheter position. The catheter was flushed and covered with asterile dressing.  Complications: None  IMPRESSION: Successful right internal jugular Power tunneled PICC line placement with ultrasound and fluoroscopic guidance. The catheter is ready for use.   Electronically Signed   By: Maryclare Bean M.D.   On: 01/14/2014 12:08   Medications:  Scheduled Meds: . sodium chloride   Intravenous Once  . abacavir  300 mg Oral BID  . acyclovir  800 mg Oral Daily  . aspirin EC  81 mg Oral Daily  .   ceFAZolin (ANCEF) IV  1 g Intravenous Q12H  . cloNIDine  0.3 mg  Oral BID  . Darunavir Ethanolate  800 mg Oral Q breakfast  . enoxaparin (LOVENOX) injection  30 mg Subcutaneous Q24H  . hydrocerin   Topical BID  . insulin aspart  0-9 Units Subcutaneous TID WC  . insulin glargine  20 Units Subcutaneous QHS  . labetalol  300 mg Oral BID  . lacosamide  150 mg Oral BID  . lactulose  20 g Oral TID  . lamiVUDine  50 mg Oral Daily  . levETIRAcetam  500 mg Oral BID  . magic mouthwash w/lidocaine  5 mL Oral TID  . ritonavir  100 mg Oral Q breakfast  . sodium bicarbonate  650 mg Oral BID  . sodium chloride  3 mL Intravenous Q12H   Continuous Infusions: .  sodium bicarbonate 150 mEq in sterile water 1000 mL infusion 150 mEq (01/15/14 0331)   PRN Meds:.acetaminophen, diphenhydrAMINE-zinc acetate, hydrOXYzine, menthol-cetylpyridinium, ondansetron (ZOFRAN) IV, ondansetron Assessment/Plan: Principal Problem:   Sepsis due to Staphylococcus aureus Active Problems:   HIV disease   CKD (chronic kidney disease) stage 4, GFR 15-29 ml/min   Diabetes   Anemia of chronic disease   HCV (hepatitis C virus)   Metabolic acidosis, increased anion gap   Altered mental state   Hepatic encephalopathy   Ascites   Fever, unspecified  Assessment:  Michelle [name] is a 53 year old woman with a PMH significant for HIV and Hep C coinfection complicated by cirrhotic liver, CKD stage IV, stroke, CHF, DMII who was admitted after being brought to the ED by family due to increased lethargy from hepatic encephalopathy that has resolved, although she was found to have MSSA bacteremia. She is now on antibiotic day 9.   Sepsis due to MSSA: She does not appear septic and her Tmax in the past 24 hrs has been 98.8. Her mental status is at baseline. She had 2/2 blood cultures on 8/10 that grew MSSA. The source of her bacteremia was likely her port-a-cath placed on 12/05/13, and this was removed on 8/11. No signs of SBP or endocarditis on  physical exam. Earlier in her hospital course, TEE was neg for endocarditis, CT abd/pelvis with minimum ascites, and MRI spine showed no abscess. She is now on antibiotic day 9. Right IJ PICC placed on 8/18. -Will continue IV cefazolin (now day 9) for 4 weeks total  -Continuing to monitor for s/s of SBP   Acute on Chronic Anemia: Asymptomatic. Her baseline Hg is between 6-8. Her HgB today is 6.4, s/p transfusion of 1 unit of pRBC on 8/14. Anti-histone IgG was negative. Dr. Cyndie Chime in Hematology recommends pRBC transfusion only if she is symptomatic. She received aranesp on 8/15 and the remaining portion of her dose yesterday. When talking with Dr. Cyndie Chime, he recommended giving the patient Aranesp 214mcg-300mcg every 2-3 weeks. She has no signs or symptoms of bleeding. In the past, she had an extensive work up for hemolytic anemia, anemia of chronic disease, with EGD and colonoscopy on 7/23 that were unremarkable for potential bleeding sources. 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 Drug induced anemia by hydralazine has also been proposed as a contributing factor since her ANA titer increased with the use of this medication. However, her BP has done poorly after discontinuation of her hydralazine.  -Restarting hydralazine at 50mg  TID -She will need repeat ANA and titer after 1 month of not taking hydralazine for evaluation of hydralazine  effect; however, antihypertensive meds will need to be balanced in outpatient setting   Acute on Chronic Renal Failure: Cr still elevated but downtrending and at 2.72 today. Baseline Cr in the 2.4-2.8 range. The AKI is likely multifactorial from furosemide use, ACEi use, and possibly 2 days of Vancomycin use for broad spectrum bacteremia coverage. UOP has been adequate and her UA remarkable only for yeast infection. Has had good UOP.  -Strict in and outs    -Holding Lasix and ACEi   HTN: She has been hypertensive for the past 48 hours. She also has had ANA positive with titers that increased from 1:40 to 1:80 after use of hydralazine. After discussing these findings with Dr. Arlean Hopping in Nephrology will discontinued it now. Nephrology recommended decreasing anti-hypertensive medications yesterday in order to increase renal perfusion. However, patient has had >200 systolic and >100 diastolic today. Current antihypertensives are Clonidine 0.3mg  BID and labetalol 300 BID; will start clonidine patch for better compliance in the outpatient setting and increase labetalol to 600mg  BID. Hydralazine will also be restarted.   -Clonidine patch  -Increase labetalol to 600mg  BID  -Restarting hydralazine at 50mg  TID  -Holding lisinopril 40mg  daily in setting of AKI  -Holding Lasix   Vaginal candidiasis: She complains of vaginal pruritus that has improved since receiving fluconazole two days ago.  Her UA was remarkable for many yeast, and is likely due to a candida infection.  -fluconazole 150mg  given on 8/17, will give another dose today due to persistent symptoms   Hepatic Encephalopathy: Resolved. She arrived lethargic which was concerning for acute hepatic encephalopathy. Ammonia level on admission was 127 and increased to 161, but is at her baseline mental status.  -Continue home lactulose PO PRN titrated to 3BM/day  -Diet heart healthy/ Carb mod only if fully alert   Ascites: SBP unlikely. Soft and obese abdomen, only mildly distended and not tense. Mild RUQ pain initially but [place] Abd was unremarkable for biliary obstruction or disease. CT abdomen/pelvis on presentation showed mild ascities, but no acute intraabdominal process. Bilirubin downtrending and at 3.5 today. U/S from 8/12 did not show a gallstones or gallbladder process.  -Will consider diagnostic/therapeutic tap of ascitic fluid if she declines   History of seizure: Stable. Dilantin was discontinued  on admission due to supertherapeutic level of 25 and we will continue to hold it due to renal function. She was started on vimpat instead. She had pseudoseizures on 8/12 and 8/13 with voluntary jerking movements while following commands and remaining fully conversant. No confusion, tongue biting, or incontinence. She has not had seizure activity during this hospitalization.  -Continue vimpat  -Continue Keppra   HIV disease: Well controlled. Her viral load was rechecked and was undetectable on 01/14/14. Her CD4 count was also checked and had a count of 240. It was at 280 in April.  -Continue home abacavir, prezista, norvir, and epivir  -Continue acyclovir for prophylaxis   Hypokalemia: resolved. K is 3.6 today.   Hepatitis C: Genotype in December 2014. Genotype 1b, viral load of 4276200, Log of 6.63.  -She will f/u with Dr. 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 Secondary to Hepatitis C infection. 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 Platelets and INR stable.   DMII: Last Hg A1C 7.5% on 11/13/13. On Lantus 30 u qHS and Novolog 10-25 u actid at home.  -Lantus 20 units qHS  -Sensitive SSI   DVT prophylaxis: SCDs   Diet:  HH, Carb mod   Dispo: Anticipated discharge to SNF today.  SW has confirmed a bed for her while she is on IV antibiotics for 4 weeks as her sister will not be able to manage the central PICC line and antibiotics.    This is a Psychologist, occupationalMedical Student Note.  The care of the patient was discussed with Dr. Garald BraverKennerly and the assessment and plan formulated with their assistance.  Please see their attached note for official documentation of the daily encounter.   LOS: 12 days   Lillia CarmelNida Xaivier Malay, Med Student 01/15/2014, 11:26 AM

## 2014-01-15 NOTE — Progress Notes (Signed)
Notified MD pt BP 167/68 after given clonidine tablet 0.3mg . No new orders given at this time. MD stated to notify if BP systolic greater than 190, diastolic greater than 100. Patient currently resting comfortably at this time, will continue to monitor.

## 2014-01-15 NOTE — Progress Notes (Signed)
Occupational Therapy Treatment Patient Details Name: Michelle [name] MRN: [id number] DOB: [birth year] Today's Date: 01/15/2014    History of present illness Patient is a 53 y/o female admitted from home with increased lethargy for the last 24 hours and AMS. Diagnosed with hepatic encephalopathy and ascites. PMH positive for cirrhotic liver secondary to Hep C infection, HIV, CKD stage IV, CVA, DM II, seizures, HTN and chronic anemia.   OT comments  Patient received seated in recliner with 7/10 complaints of pain that wrapped from stomach > back, RN made aware. Patient stood with minimal assistance and therapist using gait belt. Patient then ambulated > sink for grooming and UB bathing tasks while in seated position (using BSC). Patient met cognitive goal and able to follow 3/3 two step commands related to ADL/self-care tasks. Patient with no signs of agitation and seems eager to d/c > SNF. Patient does occasionally require verbal and tactile cues for safety, sequencing, and initiation. At end of session, left patient seated in recliner with all needs within reach and bed alarm set.    Follow Up Recommendations  SNF;Supervision/Assistance - 24 hour    Equipment Recommendations   (defer to next venue)    Recommendations for Other Services  none at this time    Precautions / Restrictions Precautions Precautions: Fall Precaution Comments: Contact precautions. Restrictions Weight Bearing Restrictions: No       Mobility  Transfers Overall transfer level: Needs assistance Equipment used: None Transfers: Sit to/from Stand Sit to Stand: Min assist         General transfer comment: Patient able to perform sit<>stand with minimal assistance and min verbal cues.     Balance Overall balance assessment: Needs assistance         Standing balance support: During functional activity;No upper extremity supported Standing balance-Leahy Scale: Fair Standing balance comment: Without BUE  support                   ADL Overall ADL's : Needs assistance/impaired     Grooming: Wash/dry hands;Set up;Wash/dry face Grooming Details (indicate cue type and reason): seated at sink using BSC Upper Body Bathing: Set up;Cueing for safety;Cueing for sequencing Upper Body Bathing Details (indicate cue type and reason): seated at sink using BSC             Toilet Transfer: Minimal assistance;Cueing for safety;Cueing for sequencing;BSC           Functional mobility during ADLs: Minimal assistance (with therapist using gait belt and no AD used) General ADL Comments: Patient able to follow one step commands consistently. Patient did require verbal cues for safety and sequencing during ADL tasks performed. Patient willing to work with therapist and seems excited to go to SNF soon.                 Cognition   Behavior During Therapy: WFL for tasks assessed/performed Overall Cognitive Status: Impaired/Different from baseline Area of Impairment: Memory;Following commands;Safety/judgement                General Comments: When therapist entered room, patient stated "I'm waiting my ride, i'm going to get more help". Patient oriented to person place, time (with min cues).                  Pertinent Vitals/ Pain       Pain Assessment: 0-10 Pain Score: 7  Pain Location: patient with complaints of pain that wrapped from stomah>back Pain Intervention(s): Repositioned;Patient requesting pain meds-RN  notified;Monitored during session         Frequency Min 2X/week     Progress Toward Goals  OT Goals(current goals can now be found in the care plan section)  Progress towards OT goals: Progressing toward goals  Acute Rehab OT Goals Patient Stated Goal: not stated OT Goal Formulation: Patient unable to participate in goal setting Time For Goal Achievement: 01/20/14 Potential to Achieve Goals: Good  Plan Discharge plan remains appropriate       End of  Session Equipment Utilized During Treatment: Gait belt   Activity Tolerance Patient tolerated treatment well   Patient Left in chair;with chair alarm set   Nurse Communication Other (comment) (patient's complaints of pain)     Time: 1340-1406 OT Time Calculation (min): 26 min  Charges: OT General Charges $OT Visit: 1 Procedure OT Treatments $Self Care/Home Management : 23-37 mins  Leron Stoffers , MS, OTR/L, CLT Pager: [phone]  01/15/2014, 2:23 PM

## 2014-01-15 NOTE — Progress Notes (Signed)
Notified MD on call of patients BP 215/79 after given 5mg  hydralazine IV. Order to give clonidine table 0.3 mg given.

## 2014-01-15 NOTE — Progress Notes (Signed)
S:Reports she is feeling better than yesterday.  No acute complaints. O:BP 199/77  Pulse 79  Temp(Src) 98.2 F (36.8 C) (Oral)  Resp 22  Ht 5\' 5"  (1.651 m)  Wt 179 lb 14.3 oz (81.6 kg)  BMI 29.94 kg/m2  SpO2 96%  Intake/Output Summary (Last 24 hours) at 01/15/14 0854 Last data filed at 01/15/14 0330  Gross per 24 hour  Intake   1215 ml  Output      0 ml  Net   1215 ml   Intake/Output: I/O last 3 completed shifts: In: 1455 [P.O.:480; I.V.:975] Out: -   Intake/Output this shift:    Weight change: 0 lb (0 kg) TUU:EKCMKLK in bed eating breakfast, NAD CVS:RRR no murmur appreciated Resp:ronchi bilaterally JZP:HXTA, + BS, non tender, non distended VWP:VXYIA edema   Recent Labs Lab 01/08/14 2340 01/09/14 0612 01/10/14 0540 01/11/14 0615 01/12/14 0643 01/13/14 0539 01/14/14 0545 01/15/14 0500  NA 136* 140 139 134* 136* 134* 137 137  K 3.5* 4.0 3.6* 3.8 3.8 3.6* 3.4* 3.6*  CL 103 108 108 103 107 103 103 101  CO2 16* 17* 16* 14* 13* 14* 20 22  GLUCOSE 256* 197* 246* 226* 221* 212* 242* 262*  BUN 49* 51* 55* 58* 55* 57* 50* 44*  CREATININE 2.77* 2.83* 3.08* 3.28* 3.19* 3.37* 2.97* 2.72*  ALBUMIN 1.5* 1.5* 1.4* 1.4* 1.3* 1.4* 1.4* 1.2*  CALCIUM 7.2* 7.2* 7.4* 7.7* 7.8* 7.9* 7.7* 7.6*  PHOS 3.2  --   --   --   --   --   --   --   AST 62* 62* 67* 58* 58* 87* 59* 62*  ALT 18 14 8  <5 <5 5 <5 <5   Liver Function Tests:  Recent Labs Lab 01/13/14 0539 01/14/14 0545 01/15/14 0500  AST 87* 59* 62*  ALT 5 <5 <5  ALKPHOS 704* 693* 669*  BILITOT 4.3* 4.0* 3.5*  PROT 5.7* 5.8* 6.0  ALBUMIN 1.4* 1.4* 1.2*   No results found for this basename: LIPASE, AMYLASE,  in the last 168 hours No results found for this basename: AMMONIA,  in the last 168 hours CBC:  Recent Labs Lab 01/09/14 1015 01/10/14 0540 01/11/14 0047 01/12/14 0850 01/13/14 0539 01/14/14 0545 01/15/14 0500  WBC 8.6 7.7 6.7 7.6 8.4 8.6 8.5  NEUTROABS 6.8 5.6  --   --   --   --   --   HGB 7.0* 6.8*  6.6* 7.0* 6.7* 6.3* 6.4*  HCT 20.9* 20.4* 20.1* 21.3* 19.8* 19.1* 19.7*  MCV 105.0* 107.9* 107.5* 108.7* 104.2* 108.5* 106.5*  PLT 125* 138* 155 202 229 215 225   Cardiac Enzymes:  Recent Labs Lab 01/08/14 2340 01/09/14 0612 01/09/14 1153  CKTOTAL  --  59  --   TROPONINI 0.34* <0.30 <0.30   CBG:  Recent Labs Lab 01/14/14 0742 01/14/14 1139 01/14/14 1641 01/14/14 2159 01/15/14 0752  GLUCAP 258* 188* 285* 275* 278*    Iron Studies: No results found for this basename: IRON, TIBC, TRANSFERRIN, FERRITIN,  in the last 72 hours Studies/Results: Ir Fluoro Guide Cv Line Right  01/14/2014   CLINICAL DATA:  HIV  EXAM: RIGHT INTERNAL JUGULAR TUNNELED PICC LINE PLACEMENT WITH ULTRASOUND AND FLUOROSCOPIC GUIDANCE  FLUOROSCOPY TIME:  12 seconds.  PROCEDURE: The patient was advised of the possible risks andcomplications and agreed to undergo the procedure. The patient was then brought to the angiographic suite for the procedure.  The right neck was prepped with chlorhexidine, drapedin the usual sterile fashion  using maximum barrier technique (cap and mask, sterile gown, sterile gloves, large sterile sheet, hand hygiene and cutaneous antisepsis) and infiltrated locally with 1% Lidocaine.  Ultrasound demonstrated patency of the right internal jugular vein, and this was documented with an image. Under real-time ultrasound guidance, this vein was accessed with a 21 gauge micropuncture needle and image documentation was performed. A 0.018 wire was introduced in to the vein. Over this, a 5 [place] double lumen Power tunneled PICC was advanced to the lower SVC/right atrial junction. The cuff was positioned in the subcutaneous tract. Fluoroscopy during the procedure and fluoro spot radiograph confirms appropriate catheter position. The catheter was flushed and covered with asterile dressing.  Complications: None  IMPRESSION: Successful right internal jugular Power tunneled PICC line placement with ultrasound  and fluoroscopic guidance. The catheter is ready for use.   Electronically Signed   By: Maryclare BeanArt  Hoss M.D.   On: 01/14/2014 12:08   Ir Koreas Guide Vasc Access Right  01/14/2014   CLINICAL DATA:  HIV  EXAM: RIGHT INTERNAL JUGULAR TUNNELED PICC LINE PLACEMENT WITH ULTRASOUND AND FLUOROSCOPIC GUIDANCE  FLUOROSCOPY TIME:  12 seconds.  PROCEDURE: The patient was advised of the possible risks andcomplications and agreed to undergo the procedure. The patient was then brought to the angiographic suite for the procedure.  The right neck was prepped with chlorhexidine, drapedin the usual sterile fashion using maximum barrier technique (cap and mask, sterile gown, sterile gloves, large sterile sheet, hand hygiene and cutaneous antisepsis) and infiltrated locally with 1% Lidocaine.  Ultrasound demonstrated patency of the right internal jugular vein, and this was documented with an image. Under real-time ultrasound guidance, this vein was accessed with a 21 gauge micropuncture needle and image documentation was performed. A 0.018 wire was introduced in to the vein. Over this, a 5 [place] double lumen Power tunneled PICC was advanced to the lower SVC/right atrial junction. The cuff was positioned in the subcutaneous tract. Fluoroscopy during the procedure and fluoro spot radiograph confirms appropriate catheter position. The catheter was flushed and covered with asterile dressing.  Complications: None  IMPRESSION: Successful right internal jugular Power tunneled PICC line placement with ultrasound and fluoroscopic guidance. The catheter is ready for use.   Electronically Signed   By: Maryclare BeanArt  Hoss M.D.   On: 01/14/2014 12:08   . sodium chloride   Intravenous Once  . abacavir  300 mg Oral BID  . acyclovir  800 mg Oral Daily  . aspirin EC  81 mg Oral Daily  .  ceFAZolin (ANCEF) IV  1 g Intravenous Q12H  . cloNIDine  0.3 mg Oral BID  . Darunavir Ethanolate  800 mg Oral Q breakfast  . enoxaparin (LOVENOX) injection  30 mg  Subcutaneous Q24H  . hydrocerin   Topical BID  . insulin aspart  0-9 Units Subcutaneous TID WC  . insulin glargine  20 Units Subcutaneous QHS  . labetalol  300 mg Oral BID  . lacosamide  150 mg Oral BID  . lactulose  20 g Oral TID  . lamiVUDine  50 mg Oral Daily  . levETIRAcetam  500 mg Oral BID  . magic mouthwash w/lidocaine  5 mL Oral TID  . ritonavir  100 mg Oral Q breakfast  . sodium bicarbonate  650 mg Oral BID  . sodium chloride  3 mL Intravenous Q12H    BMET    Component Value Date/Time   NA 137 01/15/2014 0500   K 3.6* 01/15/2014 0500   CL 101 01/15/2014 0500  CO2 22 01/15/2014 0500   GLUCOSE 262* 01/15/2014 0500   BUN 44* 01/15/2014 0500   CREATININE 2.72* 01/15/2014 0500   CREATININE 1.85* 10/04/2013 1440   CALCIUM 7.6* 01/15/2014 0500   GFRNONAA 19* 01/15/2014 0500   GFRNONAA 27* 08/19/2013 1618   GFRAA 22* 01/15/2014 0500   GFRAA 31* 08/19/2013 1618   CBC    Component Value Date/Time   WBC 8.5 01/15/2014 0500   WBC 6.0 09/25/2013 1353   RBC 1.85* 01/15/2014 0500   RBC 1.76* 01/11/2014 1057   RBC 2.21* 09/25/2013 1353   HGB 6.4* 01/15/2014 0500   HGB 7.9* 09/25/2013 1353   HCT 19.7* 01/15/2014 0500   HCT 23.9* 09/25/2013 1353   PLT 225 01/15/2014 0500   PLT 164 09/25/2013 1353   MCV 106.5* 01/15/2014 0500   MCV 108.1* 09/25/2013 1353   MCH 34.6* 01/15/2014 0500   MCH 35.7* 09/25/2013 1353   MCHC 32.5 01/15/2014 0500   MCHC 33.1 09/25/2013 1353   RDW 14.9 01/15/2014 0500   RDW 14.8* 09/25/2013 1353   LYMPHSABS 1.1 01/10/2014 0540   LYMPHSABS 0.6* 09/25/2013 1353   MONOABS 0.9 01/10/2014 0540   MONOABS 0.3 09/25/2013 1353   EOSABS 0.0 01/10/2014 0540   EOSABS 0.0 09/25/2013 1353   BASOSABS 0.0 01/10/2014 0540   BASOSABS 0.0 09/25/2013 1353   Abd U/S 01/08/14: Right kidney 10.1 cm, increased echotexture no hydronephrosis, Left kidney 10.9cm increased echotexture no hydronethroneprosis  Ct abd/pelvis 01/10/14: Trace free fluid in pelvis, increaing bibasilar densities, no acute  findings  U/A 8/15: Large bilirubin, 15 ketones, neg nitrite, small leuk, many yeast, granular casts UNa <20 U Cr 107  Assessment/Plan:  1. 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  Her baseline Cr is 2-2.5.  Her SCr has tended down to 2.7.   1. D/C IVF 2. Would recommend adjusting BP regimen to increase compliance as much as possible and avoid double alpha blockade.  Would recommend changing to Catapress patch and/or extended release Metoprolol to help improve adherence to medications as well as avoid rebound hypertension. 2. MSSA sepsis: plan for 4 weeks of IV Ancef 3. HIV: well controlled 4. Hep C with Cirrhosis: ID considering outpatient treatment for Hep C 5. 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 HTN>> Clonidine 0.3mg  BID, Labetalol 300mg  BID currently but again would recommend extended release agents where available to enhance compliance as well as avoid agents that will cause rebound HTN 7. Anemia in CKD:  Hgb 6.4 today, patient has been seen previously by Dr. Cyndie Chime- Hematology who recommends Aranesp q 2-3 weeks 8. Seizures>> Keppra 9. Dispo Needs SW assistance with transportation to keep outpatient renal appointments, will need VVS evaluation for access placement.  She has an appointment already scheduled with Dr. Marisue Humble on 01/21/14 at 8:15 and she was strongly encouraged to keep her outpt appointments.  [facility] Internal [facility] PGY-2 Pager [phone]  I have seen and examined this patient and agree with plan as outlined by Dr. Mikey Bussing except for the additions above.  Pt stable for discharge and agree with primary svc plans.  F/u with Dr. Marisue Humble as an outpt. Graeme Menees A,MD 01/15/2014 10:17 AM

## 2014-01-15 NOTE — Progress Notes (Signed)
Patient's blood pressure 199/77 this morning. Notified Dr. Isabella Bowens. No new orders given, will continue to monitor patient.

## 2014-01-15 NOTE — Progress Notes (Signed)
PT Cancellation Note  Patient Details Name: Michelle [name] MRN: [id number] DOB: Nov 10, 1960   Cancelled Treatment:    Reason Eval/Treat Not Completed: Patient declined, no reason specified Pt refuses to participate in therapy. "I walked 3 times today!" "I am getting ready to go to the facility." Educated pt on importance of mobility, pt continues to decline. Pt to be discharged today. Will follow up on 8/20 if pt still in hospital.   Alvie Heidelberg A 01/15/2014, 3:49 PM Alvie Heidelberg, PT, DPT [phone]

## 2014-01-15 NOTE — Progress Notes (Signed)
Subjective: Her BP has been elevated this morning but she is asymptomatic with no complaints. She is glad to "move on" and go to SNF.    Objective: Vital signs in last 24 hours: Filed Vitals:   01/15/14 0257 01/15/14 0529 01/15/14 1210 01/15/14 1310  BP: 167/68 199/77 198/79 197/80  Pulse: 69 79 83 75  Temp:  98.2 F (36.8 C)  99.3 F (37.4 C)  TempSrc:  Oral  Oral  Resp:  22 20 20   Height:      Weight:  179 lb 14.3 oz (81.6 kg)    SpO2:  96%  95%   Weight change: 0 lb (0 kg)  Intake/Output Summary (Last 24 hours) at 01/15/14 1324 Last data filed at 01/15/14 1300  Gross per 24 hour  Intake   1455 ml  Output    200 ml  Net   1255 ml   Vital signs reviewed  Gen: Sitting in bed, in NAD.  HEENT: mild scleral icterus, MM  CV: RRR  Chest: Peripheral IV to her left chest with ecchymosis that is improving it extends to her medial left arm with TTP, no increased warmth, no erythema. Right chest with site of port-a-cath that was removed with no edema/erythema, covered with dressing that is c/d/i. Right IJ in place with no surrounding edema or erythema.  Resp: CTAB, normal respiratory effort, no wheezing  Abd: mildly distended, non tender, soft, bowel sounds present.  Ext: no peripheral edema, dry skin, no teleangiectasia.  Neuro: Alert and oriented x3, no slurred speech, no asterixis, moves her extremities voluntairly     Lab Results: Basic Metabolic Panel:  Recent Labs Lab 01/08/14 2340  01/14/14 0545 01/15/14 0500  NA 136*  < > 137 137  K 3.5*  < > 3.4* 3.6*  CL 103  < > 103 101  CO2 16*  < > 20 22  GLUCOSE 256*  < > 242* 262*  BUN 49*  < > 50* 44*  CREATININE 2.77*  < > 2.97* 2.72*  CALCIUM 7.2*  < > 7.7* 7.6*  MG 1.7  --  1.7  --   PHOS 3.2  --   --   --   < > = values in this interval not displayed. Liver Function Tests:  Recent Labs Lab 01/14/14 0545 01/15/14 0500  AST 59* 62*  ALT <5 <5  ALKPHOS 693* 669*  BILITOT 4.0* 3.5*  PROT 5.8* 6.0    ALBUMIN 1.4* 1.2*   CBC:  Recent Labs Lab 01/09/14 1015 01/10/14 0540  01/14/14 0545 01/15/14 0500  WBC 8.6 7.7  < > 8.6 8.5  NEUTROABS 6.8 5.6  --   --   --   HGB 7.0* 6.8*  < > 6.3* 6.4*  HCT 20.9* 20.4*  < > 19.1* 19.7*  MCV 105.0* 107.9*  < > 108.5* 106.5*  PLT 125* 138*  < > 215 225  < > = values in this interval not displayed. Cardiac Enzymes:  Recent Labs Lab 01/08/14 2340 01/09/14 0612 01/09/14 1153  CKTOTAL  --  59  --   TROPONINI 0.34* <0.30 <0.30   CBG:  Recent Labs Lab 01/14/14 0742 01/14/14 1139 01/14/14 1641 01/14/14 2159 01/15/14 0752 01/15/14 1157  GLUCAP 258* 188* 285* 275* 278* 305*   Coagulation:  Recent Labs Lab 01/12/14 0850  LABPROT 12.7  INR 0.95   Anemia Panel:  Recent Labs Lab 01/11/14 1057  RETICCTPCT 1.2   Urinalysis:  Recent Labs Lab 01/11/14 1627  COLORURINE AMBER*  LABSPEC 1.019  PHURINE 5.5  GLUCOSEU 100*  HGBUR NEGATIVE  BILIRUBINUR LARGE*  KETONESUR 15*  PROTEINUR >300*  UROBILINOGEN 0.2  NITRITE NEGATIVE  LEUKOCYTESUR SMALL*    Micro Results: Recent Results (from the past 240 hour(s))  CULTURE, BLOOD (ROUTINE X 2)     Status: None   Collection Time    01/05/14  3:30 PM      Result Value Ref Range Status   Specimen Description BLOOD LEFT ARM   Final   Special Requests     Final   Value: BOTTLES DRAWN AEROBIC AND ANAEROBIC 10CC BLUE, 5CC RED   Culture  Setup Time     Final   Value: 01/05/2014 22:48     Performed at [facility]   Culture     Final   Value: STAPHYLOCOCCUS AUREUS     Note: RIFAMPIN AND GENTAMICIN SHOULD NOT BE USED AS SINGLE DRUGS FOR TREATMENT OF STAPH INFECTIONS.     Note: Gram Stain Report Called to,Read Back By and Verified With: Volanda NapoleonNikki Faucette RN on 01/06/14 at 06:45 by Christie NottinghamAnne Skeen     Performed at [facility]   Report Status 01/08/2014 FINAL   Final   Organism ID, Bacteria STAPHYLOCOCCUS AUREUS   Final  CULTURE, BLOOD (ROUTINE X 2)     Status: None    Collection Time    01/05/14  3:42 PM      Result Value Ref Range Status   Specimen Description BLOOD BLOOD LEFT FOREARM   Final   Special Requests BOTTLES DRAWN AEROBIC ONLY 5CC   Final   Culture  Setup Time     Final   Value: 01/05/2014 22:48     Performed at [facility]   Culture     Final   Value: STAPHYLOCOCCUS AUREUS     Note: SUSCEPTIBILITIES PERFORMED ON PREVIOUS CULTURE WITHIN THE LAST 5 DAYS.     Note: Gram Stain Report Called to,Read Back By and Verified With: Volanda NapoleonNikki Faucette RN on 01/06/14 at 06:45 by Christie NottinghamAnne Skeen     Performed at [facility]   Report Status 01/08/2014 FINAL   Final  CULTURE, BLOOD (ROUTINE X 2)     Status: None   Collection Time    01/06/14  3:35 PM      Result Value Ref Range Status   Specimen Description BLOOD LEFT ANTECUBITAL   Final   Special Requests     Final   Value: BOTTLES DRAWN AEROBIC AND ANAEROBIC 10CC AER 5CC ANA   Culture  Setup Time     Final   Value: 01/06/2014 19:02     Performed at [facility]   Culture     Final   Value: STAPHYLOCOCCUS AUREUS     Note: RIFAMPIN AND GENTAMICIN SHOULD NOT BE USED AS SINGLE DRUGS FOR TREATMENT OF STAPH INFECTIONS.     Note: Gram Stain Report Called to,Read Back By and Verified With: Junius ArgyleJANICE TEMBRINA 01/08/14 AT 0100 RIDK     Performed at [facility]   Report Status 01/10/2014 FINAL   Final   Organism ID, Bacteria STAPHYLOCOCCUS AUREUS   Final  CULTURE, BLOOD (ROUTINE X 2)     Status: None   Collection Time    01/06/14  3:45 PM      Result Value Ref Range Status   Specimen Description BLOOD LEFT HAND   Final   Special Requests     Final   Value: BOTTLES DRAWN  AEROBIC AND ANAEROBIC 10CC AER 5CC ANA   Culture  Setup Time     Final   Value: 01/06/2014 19:00     Performed at [facility]   Culture     Final   Value: NO GROWTH 5 DAYS     Performed at [facility]   Report Status 01/13/2014 FINAL   Final  CULTURE, BLOOD (ROUTINE X 2)     Status: None    Collection Time    01/08/14  6:56 AM      Result Value Ref Range Status   Specimen Description BLOOD LEFT HAND   Final   Special Requests BOTTLES DRAWN AEROBIC AND ANAEROBIC 10CC   Final   Culture  Setup Time     Final   Value: 01/08/2014 10:12     Performed at [facility]   Culture     Final   Value: NO GROWTH 5 DAYS     Performed at [facility]   Report Status 01/14/2014 FINAL   Final  CULTURE, BLOOD (ROUTINE X 2)     Status: None   Collection Time    01/08/14  7:05 AM      Result Value Ref Range Status   Specimen Description BLOOD BLOOD LEFT FOREARM   Final   Special Requests BOTTLES DRAWN AEROBIC ONLY [facility]   Final   Culture  Setup Time     Final   Value: 01/08/2014 10:12     Performed at [facility]   Culture     Final   Value: NO GROWTH 5 DAYS     Performed at [facility]   Report Status 01/14/2014 FINAL   Final  CULTURE, BLOOD (ROUTINE X 2)     Status: None   Collection Time    01/10/14 12:47 AM      Result Value Ref Range Status   Specimen Description BLOOD LEFT FOREARM   Final   Special Requests     Final   Value: BOTTLES DRAWN AEROBIC AND ANAEROBIC BLUE 10 CC RED 5 CC   Culture  Setup Time     Final   Value: 01/11/2014 13:10     Performed at [facility]   Culture     Final   Value:        BLOOD CULTURE RECEIVED NO GROWTH TO DATE CULTURE WILL BE HELD FOR 5 DAYS BEFORE ISSUING A FINAL NEGATIVE REPORT     Performed at [facility]   Report Status PENDING   Incomplete  CULTURE, BLOOD (ROUTINE X 2)     Status: None   Collection Time    01/10/14 12:53 AM      Result Value Ref Range Status   Specimen Description BLOOD LEFT HAND   Final   Special Requests BOTTLES DRAWN AEROBIC AND ANAEROBIC 10 CC   Final   Culture  Setup Time     Final   Value: 01/11/2014 13:10     Performed at [facility]   Culture     Final   Value:        BLOOD CULTURE RECEIVED NO GROWTH TO DATE CULTURE WILL BE HELD FOR 5 DAYS BEFORE  ISSUING A FINAL NEGATIVE REPORT     Performed at [facility]   Report Status PENDING   Incomplete   Studies/Results: Ir Fluoro Guide Cv Line Right  01/14/2014   CLINICAL DATA:  HIV  EXAM: RIGHT INTERNAL JUGULAR TUNNELED PICC  LINE PLACEMENT WITH ULTRASOUND AND FLUOROSCOPIC GUIDANCE  FLUOROSCOPY TIME:  12 seconds.  PROCEDURE: The patient was advised of the possible risks andcomplications and agreed to undergo the procedure. The patient was then brought to the angiographic suite for the procedure.  The right neck was prepped with chlorhexidine, drapedin the usual sterile fashion using maximum barrier technique (cap and mask, sterile gown, sterile gloves, large sterile sheet, hand hygiene and cutaneous antisepsis) and infiltrated locally with 1% Lidocaine.  Ultrasound demonstrated patency of the right internal jugular vein, and this was documented with an image. Under real-time ultrasound guidance, this vein was accessed with a 21 gauge micropuncture needle and image documentation was performed. A 0.018 wire was introduced in to the vein. Over this, a 5 [place] double lumen Power tunneled PICC was advanced to the lower SVC/right atrial junction. The cuff was positioned in the subcutaneous tract. Fluoroscopy during the procedure and fluoro spot radiograph confirms appropriate catheter position. The catheter was flushed and covered with asterile dressing.  Complications: None  IMPRESSION: Successful right internal jugular Power tunneled PICC line placement with ultrasound and fluoroscopic guidance. The catheter is ready for use.   Electronically Signed   By: Maryclare Bean M.D.   On: 01/14/2014 12:08   Ir US Guide Vasc Access Right  01/14/2014   CLINICAL DATA:  HIV  EXAM: RIGHT INTERNAL JUGULAR TUNNELED PICC LINE PLACEMENT WITH ULTRASOUND AND FLUOROSCOPIC GUIDANCE  FLUOROSCOPY TIME:  12 seconds.  PROCEDURE: The patient was advised of the possible risks andcomplications and agreed to undergo the procedure. The  patient was then brought to the angiographic suite for the procedure.  The right neck was prepped with chlorhexidine, drapedin the usual sterile fashion using maximum barrier technique (cap and mask, sterile gown, sterile gloves, large sterile sheet, hand hygiene and cutaneous antisepsis) and infiltrated locally with 1% Lidocaine.  Ultrasound demonstrated patency of the right internal jugular vein, and this was documented with an image. Under real-time ultrasound guidance, this vein was accessed with a 21 gauge micropuncture needle and image documentation was performed. A 0.018 wire was introduced in to the vein. Over this, a 5 [place] double lumen Power tunneled PICC was advanced to the lower SVC/right atrial junction. The cuff was positioned in the subcutaneous tract. Fluoroscopy during the procedure and fluoro spot radiograph confirms appropriate catheter position. The catheter was flushed and covered with asterile dressing.  Complications: None  IMPRESSION: Successful right internal jugular Power tunneled PICC line placement with ultrasound and fluoroscopic guidance. The catheter is ready for use.   Electronically Signed   By: Maryclare Bean M.D.   On: 01/14/2014 12:08   Medications: I have reviewed the patient's current medications. Scheduled Meds: . sodium chloride   Intravenous Once  . abacavir  300 mg Oral BID  . acyclovir  800 mg Oral Daily  . aspirin EC  81 mg Oral Daily  .  ceFAZolin (ANCEF) IV  1 g Intravenous Q12H  . cloNIDine  0.3 mg Oral BID  . Darunavir Ethanolate  800 mg Oral Q breakfast  . enoxaparin (LOVENOX) injection  30 mg Subcutaneous Q24H  . hydrALAZINE  5 mg Intravenous Once  . hydrocerin   Topical BID  . insulin aspart  0-9 Units Subcutaneous TID WC  . insulin glargine  20 Units Subcutaneous QHS  . labetalol  600 mg Oral BID  . lacosamide  150 mg Oral BID  . lactulose  20 g Oral TID  . lamiVUDine  50 mg Oral Daily  .  levETIRAcetam  500 mg Oral BID  . magic mouthwash  w/lidocaine  5 mL Oral TID  . ritonavir  100 mg Oral Q breakfast  . sodium bicarbonate  650 mg Oral BID  . sodium chloride  3 mL Intravenous Q12H   Continuous Infusions: .  sodium bicarbonate 150 mEq in sterile water 1000 mL infusion 150 mEq (01/15/14 0331)   PRN Meds:.acetaminophen, diphenhydrAMINE-zinc acetate, hydrOXYzine, menthol-cetylpyridinium, ondansetron (ZOFRAN) IV, ondansetron Assessment/Plan: Ms. 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 She is now on antibiotic day 11.   Sepsis due to MSSA: Does not appear septic with Tmax 98.70F overnight. Her mental status is unchanged and at baseline. She had 2/2 BC that grew MSSA. The source of her bacteremia was likely her port-a-cath placed on 12/05/13 which was removed on 8/11. No signs of SBP or endocarditis on physical exam. She is now on antibiotic day 10. Had Right IJV PICC placed yesterday.  -Will continue cefazolin (now day 9) for 4 weeks total to end on September 7. She will have ID follow up before then and will need to have her PICC line removed   -Continuing to monitor for s/s of SBP   HTN: She has had elevated BP at overnight with SBP in 190s and DBP in 80s and required 2 doses of hydralazine 5mg  IV which lowered her SBP to 180s. She also has had ANA positive with titers that increased from 1:40 to 1:80 after use of hydralazine. After discussing these findings with Dr. Arlean Hopping in Nephrology hydralazine PO was held but she may need to restart hydralazine 50mg  TID if her BP is persistently elevated. Her labetalol and clonidine had been reduced by 50% per Nephrology but were increased again today.  -Continue holding hydralazine 50mg  TID  -Increase labetalol to 600mg   BID -Increase clonidine to 0.3mg  BID -Hold lisinopril 40mg  daily in setting of AKI  -Hold Lasix for now   Acute on chronic Anemia: Asymptomatic. Baseline Hg of 6-8. Her HgB today is 6.4 but she is asymptomatic, she is s/p transfusion of 1 unit of pRBC on 8/14. No s/s of bleeding. Has had extensive work up for this anemia. This is lkely multifactorial w bone marrow suppression due to ART therapy and ACD 2/2 to her CKD4. Dr. Cyndie Chime in Hematology/Concology recommends pRBC transfusion only if she is symptomatic.  -She received a partial dose of Aranesp on Saturday, 8/15 per Dr. Arlean Hopping in Nephrology  -Aranesp once on 8/18 per Dr. Cyndie Chime in Hematology/Oncology  -she will need Aranesp every 2-3 weeks.  -Daily CBC    Acute on Chronic Renal Failure: Cr trending down to 2.72 which is at her baseline. UOP has been adequate.  -Strict in and outs  -Hold Lasix and ACEi   Hepatic Encephalopathy: Resolved.  -Continue home lactulose PO PRN titrated to 3BM/day  -Diet heart healthy/ Carb mod only if fully alert   Ascites: Soft and obese abdomen, only mildly distended and not tense.  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 No seizure activity so far.  -Continue  vimpat  -Continue Keppra   Hypokalemia: K is 3.6 today. Will continue monitoring and provide gentle repletion given her CKD4 if K is at or <3.0.   Hepatitis C: Genotype in December 2014. Genotype 1b, viral load of 4276200, Log of 6.63.  -She will f/u with Dr. Drue Second in ID for possible treatment of Hep C.   HIV disease: Well controlled with viral load undectable. CD4 count of 240.  -Continue home abacavir, prezista, norvir, and epivir  -Continue acyclovir for prophylaxis   Vaginal candidiasis: She has had white vaginal discharge and  itching with UA remarkable for many yeast. She received one dose fluconazole 150mg  once with improvement of her symptoms but may need another dose tomorrow if her symptoms are not resolved.   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 Secondary to Hepatitis C infection. 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 Platelets and INR stable.   DMII: Last Hg A1C 7.5% on 11/13/13. On Lantus 30 u qHS and Novolog 10-25 u ac tid at home. CBGs in high 200s to low 300s. -increase Lantus to 24  units qHS  -Sensitive SSI   DVT prophylaxis: SCDs   Diet:  HH, Carb mod   Dispo: Anticipated discharge to [facility] in [place] for continued IV antibiotic use (Ancef BID with last dose on September 7th, she will need ID follow up before finishing her ABx and will need follow up for PICC line removal).   The patient does have a current PCP Christen Bame, MD) and does need an [facility] hospital follow-up appointment after discharge.   The patient does have transportation limitations that hinder transportation to clinic appointments and will need help with transportation (she has Medicaid).   .Services Needed at time of discharge: Y = Yes, Blank = No PT: Yes, at SNF  OT:   RN:   Equipment: 3 in 1 bedside commode  Other:     LOS: 12 days   Ky Barban, MD 01/15/2014, 1:24 PM

## 2014-01-15 NOTE — Progress Notes (Signed)
MD notified about BP elevated  (BP=197/80). New order for Hydralazine 5 mg IV given. Will continue to monitor the patient.

## 2014-01-15 NOTE — Progress Notes (Signed)
After Hydralazine 5 mg IV BP=182/85. MD notified. Will continue to monitor

## 2014-01-15 NOTE — Progress Notes (Signed)
Inpatient Diabetes Program Recommendations  AACE/ADA: New Consensus Statement on Inpatient Glycemic Control (2013)  Target Ranges:  Prepandial:   less than 140 mg/dL      Peak postprandial:   less than 180 mg/dL (1-2 hours)      Critically ill patients:  140 - 180 mg/dL     Results for SEPHINA, TASH (MRN [id number]) as of 01/15/2014 11:05  Ref. Range 01/14/2014 07:42 01/14/2014 11:39 01/14/2014 16:41 01/14/2014 21:59  Glucose-Capillary Latest Range: 70-99 mg/dL 239 (H) 532 (H) 023 (H) 275 (H)    Results for DHANYA, DONK (MRN [id number]) as of 01/15/2014 11:05  Ref. Range 01/15/2014 07:52  Glucose-Capillary Latest Range: 70-99 mg/dL 837 (H)     **Fasting glucose elevated.  Postprandial glucose levels also elevated.   MD- Please consider the following:  1. Increase Lantus to 30 units QHS 2. Add Novolog Meal Coverage- Novolog 4 units tid with meals     Will follow Ambrose Finland RN, MSN, CDE Diabetes Coordinator Inpatient Diabetes Program Team Pager: [phone] (8a-10p)

## 2014-01-15 NOTE — Progress Notes (Signed)
  Date: 01/15/2014  Patient name: Michelle [name]  Medical record number: [id number]  Date of birth: 10/22/1960   This patient's plan of care was discussed with the house staff. Please see their note for complete details. I concur with their findings.  Burns Spain, MD 01/15/2014, 4:18 PM

## 2014-01-17 LAB — CULTURE, BLOOD (ROUTINE X 2)
CULTURE: NO GROWTH
Culture: NO GROWTH

## 2014-01-20 ENCOUNTER — Inpatient Hospital Stay (HOSPITAL_COMMUNITY)
Admission: AD | Admit: 2014-01-20 | Discharge: 2014-01-24 | DRG: 974 | Disposition: A | Discharge status: Skilled Nursing Facility | Payer: Medicaid Other | Source: Other Acute Inpatient Hospital | Attending: Internal Medicine | Admitting: Internal Medicine

## 2014-01-20 DIAGNOSIS — A4901 Methicillin susceptible Staphylococcus aureus infection, unspecified site: Secondary | ICD-10-CM | POA: Diagnosis present

## 2014-01-20 DIAGNOSIS — R0602 Shortness of breath: Secondary | ICD-10-CM

## 2014-01-20 DIAGNOSIS — M109 Gout, unspecified: Secondary | ICD-10-CM | POA: Diagnosis present

## 2014-01-20 DIAGNOSIS — D759 Disease of blood and blood-forming organs, unspecified: Secondary | ICD-10-CM | POA: Diagnosis present

## 2014-01-20 DIAGNOSIS — E1165 Type 2 diabetes mellitus with hyperglycemia: Secondary | ICD-10-CM

## 2014-01-20 DIAGNOSIS — J189 Pneumonia, unspecified organism: Principal | ICD-10-CM | POA: Diagnosis present

## 2014-01-20 DIAGNOSIS — E119 Type 2 diabetes mellitus without complications: Secondary | ICD-10-CM | POA: Diagnosis present

## 2014-01-20 DIAGNOSIS — K7682 Hepatic encephalopathy: Secondary | ICD-10-CM | POA: Diagnosis present

## 2014-01-20 DIAGNOSIS — Z87891 Personal history of nicotine dependence: Secondary | ICD-10-CM

## 2014-01-20 DIAGNOSIS — D638 Anemia in other chronic diseases classified elsewhere: Secondary | ICD-10-CM | POA: Diagnosis present

## 2014-01-20 DIAGNOSIS — N189 Chronic kidney disease, unspecified: Secondary | ICD-10-CM

## 2014-01-20 DIAGNOSIS — B958 Unspecified staphylococcus as the cause of diseases classified elsewhere: Secondary | ICD-10-CM | POA: Diagnosis present

## 2014-01-20 DIAGNOSIS — B20 Human immunodeficiency virus [HIV] disease: Secondary | ICD-10-CM | POA: Diagnosis present

## 2014-01-20 DIAGNOSIS — R7881 Bacteremia: Secondary | ICD-10-CM | POA: Diagnosis present

## 2014-01-20 DIAGNOSIS — K7290 Hepatic failure, unspecified without coma: Secondary | ICD-10-CM | POA: Diagnosis present

## 2014-01-20 DIAGNOSIS — E669 Obesity, unspecified: Secondary | ICD-10-CM | POA: Diagnosis present

## 2014-01-20 DIAGNOSIS — J9600 Acute respiratory failure, unspecified whether with hypoxia or hypercapnia: Secondary | ICD-10-CM | POA: Diagnosis present

## 2014-01-20 DIAGNOSIS — N179 Acute kidney failure, unspecified: Secondary | ICD-10-CM | POA: Diagnosis present

## 2014-01-20 DIAGNOSIS — I129 Hypertensive chronic kidney disease with stage 1 through stage 4 chronic kidney disease, or unspecified chronic kidney disease: Secondary | ICD-10-CM | POA: Diagnosis present

## 2014-01-20 DIAGNOSIS — B182 Chronic viral hepatitis C: Secondary | ICD-10-CM

## 2014-01-20 DIAGNOSIS — R748 Abnormal levels of other serum enzymes: Secondary | ICD-10-CM | POA: Diagnosis present

## 2014-01-20 DIAGNOSIS — G40909 Epilepsy, unspecified, not intractable, without status epilepticus: Secondary | ICD-10-CM | POA: Diagnosis present

## 2014-01-20 DIAGNOSIS — K769 Liver disease, unspecified: Secondary | ICD-10-CM | POA: Diagnosis present

## 2014-01-20 DIAGNOSIS — B1921 Unspecified viral hepatitis C with hepatic coma: Secondary | ICD-10-CM | POA: Diagnosis present

## 2014-01-20 DIAGNOSIS — B1920 Unspecified viral hepatitis C without hepatic coma: Secondary | ICD-10-CM | POA: Diagnosis present

## 2014-01-20 DIAGNOSIS — D649 Anemia, unspecified: Secondary | ICD-10-CM

## 2014-01-20 DIAGNOSIS — R569 Unspecified convulsions: Secondary | ICD-10-CM

## 2014-01-20 DIAGNOSIS — I272 Pulmonary hypertension, unspecified: Secondary | ICD-10-CM

## 2014-01-20 DIAGNOSIS — Z6831 Body mass index (BMI) 31.0-31.9, adult: Secondary | ICD-10-CM | POA: Diagnosis not present

## 2014-01-20 DIAGNOSIS — N184 Chronic kidney disease, stage 4 (severe): Secondary | ICD-10-CM | POA: Diagnosis present

## 2014-01-20 DIAGNOSIS — I10 Essential (primary) hypertension: Secondary | ICD-10-CM | POA: Diagnosis present

## 2014-01-20 DIAGNOSIS — E46 Unspecified protein-calorie malnutrition: Secondary | ICD-10-CM

## 2014-01-20 HISTORY — DX: Personal history of other medical treatment: Z92.89

## 2014-01-20 HISTORY — DX: Pneumonia, unspecified organism: J18.9

## 2014-01-20 HISTORY — DX: Chronic hepatic failure without coma: K72.10

## 2014-01-20 HISTORY — DX: Anemia, unspecified: D64.9

## 2014-01-20 HISTORY — DX: Chronic kidney disease, stage 4 (severe): N18.4

## 2014-01-20 HISTORY — DX: Hepatic failure, unspecified without coma: K72.90

## 2014-01-20 HISTORY — DX: Unspecified viral hepatitis C without hepatic coma: B19.20

## 2014-01-20 HISTORY — DX: Dorsalgia, unspecified: M54.9

## 2014-01-20 HISTORY — DX: Acute myocardial infarction, unspecified: I21.9

## 2014-01-20 HISTORY — DX: Other chronic pain: G89.29

## 2014-01-20 HISTORY — DX: Unspecified osteoarthritis, unspecified site: M19.90

## 2014-01-20 LAB — MRSA PCR SCREENING: MRSA BY PCR: NEGATIVE

## 2014-01-20 LAB — COMPREHENSIVE METABOLIC PANEL
ALT: 9 U/L (ref 0–35)
AST: 47 U/L — ABNORMAL HIGH (ref 0–37)
Albumin: 1.2 g/dL — ABNORMAL LOW (ref 3.5–5.2)
Alkaline Phosphatase: 585 U/L — ABNORMAL HIGH (ref 39–117)
Anion gap: 15 (ref 5–15)
BILIRUBIN TOTAL: 3.6 mg/dL — AB (ref 0.3–1.2)
BUN: 38 mg/dL — ABNORMAL HIGH (ref 6–23)
CO2: 19 meq/L (ref 19–32)
Calcium: 7.2 mg/dL — ABNORMAL LOW (ref 8.4–10.5)
Chloride: 100 mEq/L (ref 96–112)
Creatinine, Ser: 2.17 mg/dL — ABNORMAL HIGH (ref 0.50–1.10)
GFR calc Af Amer: 28 mL/min — ABNORMAL LOW (ref >90)
GFR, EST NON AFRICAN AMERICAN: 25 mL/min — AB (ref >90)
Glucose, Bld: 424 mg/dL — ABNORMAL HIGH (ref 70–99)
Potassium: 4.2 mEq/L (ref 3.7–5.3)
SODIUM: 134 meq/L — AB (ref 137–147)
Total Protein: 6.5 g/dL (ref 6.0–8.3)

## 2014-01-20 LAB — CBC
HCT: 28.2 % — ABNORMAL LOW (ref 36.0–46.0)
Hemoglobin: 9.2 g/dL — ABNORMAL LOW (ref 12.0–15.0)
MCH: 31.3 pg (ref 26.0–34.0)
MCHC: 32.6 g/dL (ref 30.0–36.0)
MCV: 95.9 fL (ref 78.0–100.0)
Platelets: 205 10*3/uL (ref 150–400)
RBC: 2.94 MIL/uL — ABNORMAL LOW (ref 3.87–5.11)
WBC: 7.6 10*3/uL (ref 4.0–10.5)

## 2014-01-20 LAB — VANCOMYCIN, RANDOM: Vancomycin Rm: 12.5 ug/mL

## 2014-01-20 LAB — GLUCOSE, CAPILLARY: Glucose-Capillary: 459 mg/dL — ABNORMAL HIGH (ref 70–99)

## 2014-01-20 MED ORDER — HEPARIN SODIUM (PORCINE) 5000 UNIT/ML IJ SOLN: 5000.0000 [IU] | Freq: Three times a day (TID) | INTRAMUSCULAR | Status: DC

## 2014-01-20 MED ORDER — LAMIVUDINE 10 MG/ML PO SOLN
100.0000 mg | Freq: Every day | ORAL | Status: DC
  Administered 2014-01-20 – 2014-01-24 (×5): 100 mg via ORAL
  Filled 2014-01-20 (×5): qty 10

## 2014-01-20 MED ORDER — SODIUM BICARBONATE 650 MG PO TABS
650.0000 mg | ORAL_TABLET | Freq: Two times a day (BID) | ORAL | Status: DC
  Administered 2014-01-20 – 2014-01-24 (×8): 650 mg via ORAL
  Filled 2014-01-20 (×9): qty 1

## 2014-01-20 MED ORDER — PIPERACILLIN-TAZOBACTAM 3.375 G IVPB
3.3750 g | INTRAVENOUS | Status: AC
  Administered 2014-01-20: 3.375 g via INTRAVENOUS
  Filled 2014-01-20: qty 50

## 2014-01-20 MED ORDER — DARUNAVIR ETHANOLATE 800 MG PO TABS
800.0000 mg | ORAL_TABLET | Freq: Every day | ORAL | Status: DC
  Administered 2014-01-21 – 2014-01-24 (×4): 800 mg via ORAL
  Filled 2014-01-20 (×5): qty 1

## 2014-01-20 MED ORDER — INSULIN ASPART 100 UNIT/ML ~~LOC~~ SOLN: 0.0000 [IU] | Freq: Three times a day (TID) | SUBCUTANEOUS | Status: DC

## 2014-01-20 MED ORDER — ACYCLOVIR 800 MG PO TABS
800.0000 mg | ORAL_TABLET | Freq: Every day | ORAL | Status: DC
  Administered 2014-01-21 – 2014-01-24 (×4): 800 mg via ORAL
  Filled 2014-01-20 (×4): qty 1

## 2014-01-20 MED ORDER — PIPERACILLIN-TAZOBACTAM 3.375 G IVPB
3.3750 g | Freq: Three times a day (TID) | INTRAVENOUS | Status: DC
  Administered 2014-01-21 (×2): 3.375 g via INTRAVENOUS
  Filled 2014-01-20 (×4): qty 50

## 2014-01-20 MED ORDER — LABETALOL HCL 300 MG PO TABS
600.0000 mg | ORAL_TABLET | Freq: Two times a day (BID) | ORAL | Status: DC
  Administered 2014-01-20 – 2014-01-24 (×8): 600 mg via ORAL
  Filled 2014-01-20 (×9): qty 2

## 2014-01-20 MED ORDER — LACOSAMIDE 50 MG PO TABS
150.0000 mg | ORAL_TABLET | Freq: Two times a day (BID) | ORAL | Status: DC
  Administered 2014-01-20 – 2014-01-24 (×8): 150 mg via ORAL
  Filled 2014-01-20 (×8): qty 3

## 2014-01-20 MED ORDER — CLONIDINE HCL 0.3 MG PO TABS
0.3000 mg | ORAL_TABLET | Freq: Three times a day (TID) | ORAL | Status: DC
  Administered 2014-01-20: 0.3 mg via ORAL
  Filled 2014-01-20 (×4): qty 1

## 2014-01-20 MED ORDER — ABACAVIR SULFATE 300 MG PO TABS
600.0000 mg | ORAL_TABLET | Freq: Every day | ORAL | Status: DC
  Administered 2014-01-20 – 2014-01-24 (×5): 600 mg via ORAL
  Filled 2014-01-20 (×5): qty 2

## 2014-01-20 MED ORDER — INSULIN GLARGINE 100 UNIT/ML ~~LOC~~ SOLN
20.0000 [IU] | Freq: Every day | SUBCUTANEOUS | Status: DC
  Administered 2014-01-20: 20 [IU] via SUBCUTANEOUS
  Filled 2014-01-20 (×2): qty 0.2

## 2014-01-20 MED ORDER — LACTULOSE 10 GM/15ML PO SOLN
20.0000 g | Freq: Three times a day (TID) | ORAL | Status: DC
  Administered 2014-01-20 – 2014-01-24 (×10): 20 g via ORAL
  Filled 2014-01-20 (×13): qty 30

## 2014-01-20 MED ORDER — PANTOPRAZOLE SODIUM 40 MG PO TBEC
40.0000 mg | DELAYED_RELEASE_TABLET | Freq: Every day | ORAL | Status: DC
  Administered 2014-01-20 – 2014-01-24 (×5): 40 mg via ORAL
  Filled 2014-01-20 (×5): qty 1

## 2014-01-20 MED ORDER — SODIUM CHLORIDE 0.9 % IV SOLN
1500.0000 mg | INTRAVENOUS | Status: DC
  Filled 2014-01-20: qty 1500

## 2014-01-20 MED ORDER — HYDROCERIN EX CREA
1.0000 "application " | TOPICAL_CREAM | Freq: Two times a day (BID) | CUTANEOUS | Status: DC
  Administered 2014-01-20 – 2014-01-24 (×8): 1 via TOPICAL
  Filled 2014-01-20: qty 113

## 2014-01-20 MED ORDER — LEVETIRACETAM 750 MG PO TABS
1500.0000 mg | ORAL_TABLET | Freq: Two times a day (BID) | ORAL | Status: DC
  Filled 2014-01-20: qty 2

## 2014-01-20 MED ORDER — HYDROXYZINE HCL 10 MG PO TABS
10.0000 mg | ORAL_TABLET | Freq: Three times a day (TID) | ORAL | Status: DC | PRN
  Administered 2014-01-22 – 2014-01-23 (×2): 10 mg via ORAL
  Filled 2014-01-20 (×2): qty 1

## 2014-01-20 MED ORDER — VANCOMYCIN HCL 10 G IV SOLR
1500.0000 mg | INTRAVENOUS | Status: AC
  Administered 2014-01-21: 1500 mg via INTRAVENOUS
  Filled 2014-01-20: qty 1500

## 2014-01-20 MED ORDER — RITONAVIR 100 MG PO CAPS
100.0000 mg | ORAL_CAPSULE | Freq: Every day | ORAL | Status: DC
  Administered 2014-01-21 – 2014-01-24 (×4): 100 mg via ORAL
  Filled 2014-01-20 (×5): qty 1

## 2014-01-20 MED ORDER — ASPIRIN EC 81 MG PO TBEC
81.0000 mg | DELAYED_RELEASE_TABLET | Freq: Every day | ORAL | Status: DC
  Administered 2014-01-20 – 2014-01-24 (×5): 81 mg via ORAL
  Filled 2014-01-20 (×5): qty 1

## 2014-01-20 NOTE — Progress Notes (Signed)
01/20/14  Pharmacy- Vancomycin 2255  Vancomycin, Random 12.5  A/P:  53yo female with pneumonia on Vancomcyin 1500mg  IV q24 at [facility].  Random level this evening just below goal of 15-20 and checked at 26hr after last dose.  Will continue Vancomycin 1500mg   IV q24 tonight and check ss level.  Marisue Humble, PharmD Clinical Pharmacist [facility]

## 2014-01-20 NOTE — Progress Notes (Signed)
ANTIBIOTIC CONSULT NOTE - INITIAL  Pharmacy Consult for Vancomycin and Zosyn Indication: pneumonia and MSSA bacteremia  Allergies  Allergen Reactions  . 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 Morphine And Related Hives, Itching and Rash  . Norvasc [Amlodipine Besylate] Hives, Itching and Rash    Patient Measurements: Height: 5' 5.5" (166.4 cm) Weight: 190 lb 11.2 oz (86.5 kg) IBW/kg (Calculated) : 58.15 Adjusted Body Weight:   Vital Signs: Temp: 97.5 F (36.4 C) (08/24 1610) Temp src: Oral (08/24 1610) BP: 172/79 mmHg (08/24 1610) Pulse Rate: 73 (08/24 1610) Intake/Output from previous day:   Intake/Output from this shift:    Labs: No results found for this basename: WBC, HGB, PLT, LABCREA, CREATININE,  in the last 72 hours Estimated Creatinine Clearance: 25.9 ml/min (by C-G formula based on Cr of 2.72). No results found for this basename: VANCOTROUGH, Leodis Binet, VANCORANDOM, GENTTROUGH, GENTPEAK, GENTRANDOM, TOBRATROUGH, TOBRAPEAK, TOBRARND, AMIKACINPEAK, AMIKACINTROU, AMIKACIN,  in the last 72 hours   Microbiology: Recent Results (from the past 720 hour(s))  MRSA PCR SCREENING     Status: Abnormal   Collection Time    01/04/14  4:35 AM      Result Value Ref Range Status   MRSA by PCR POSITIVE (*) NEGATIVE Final   Comment:            The GeneXpert MRSA Assay (FDA     approved for NASAL specimens     only), is one component of a     comprehensive MRSA colonization     surveillance program. It is not     intended to diagnose MRSA     infection nor to guide or     monitor treatment for     MRSA infections.     RESULT CALLED TO, READ BACK BY AND VERIFIED WITH:     R.ZELLNER,RN 1610 01/04/14 M.CAMPBELL  CULTURE, BLOOD (ROUTINE X 2)     Status: None   Collection Time    01/05/14  3:30 PM      Result Value Ref Range Status   Specimen Description BLOOD LEFT ARM   Final   Special Requests     Final    Value: BOTTLES DRAWN AEROBIC AND ANAEROBIC 10CC BLUE, 5CC RED   Culture  Setup Time     Final   Value: 01/05/2014 22:48     Performed at [facility]   Culture     Final   Value: STAPHYLOCOCCUS AUREUS     Note: RIFAMPIN AND GENTAMICIN SHOULD NOT BE USED AS SINGLE DRUGS FOR TREATMENT OF STAPH INFECTIONS.     Note: Gram Stain Report Called to,Read Back By and Verified With: Volanda Napoleon RN on 01/06/14 at 06:45 by Christie Nottingham     Performed at [facility]   Report Status 01/08/2014 FINAL   Final   Organism ID, Bacteria STAPHYLOCOCCUS AUREUS   Final  CULTURE, BLOOD (ROUTINE X 2)     Status: None   Collection Time    01/05/14  3:42 PM      Result Value Ref Range Status   Specimen Description BLOOD BLOOD LEFT FOREARM   Final   Special Requests BOTTLES DRAWN AEROBIC ONLY 5CC   Final   Culture  Setup Time     Final   Value: 01/05/2014 22:48     Performed at [facility]   Culture     Final   Value: STAPHYLOCOCCUS AUREUS  Note: SUSCEPTIBILITIES PERFORMED ON PREVIOUS CULTURE WITHIN THE LAST 5 DAYS.     Note: Gram Stain Report Called to,Read Back By and Verified With: Volanda Napoleon RN on 01/06/14 at 06:45 by Christie Nottingham     Performed at [facility]   Report Status 01/08/2014 FINAL   Final  CULTURE, BLOOD (ROUTINE X 2)     Status: None   Collection Time    01/06/14  3:35 PM      Result Value Ref Range Status   Specimen Description BLOOD LEFT ANTECUBITAL   Final   Special Requests     Final   Value: BOTTLES DRAWN AEROBIC AND ANAEROBIC 10CC AER 5CC ANA   Culture  Setup Time     Final   Value: 01/06/2014 19:02     Performed at [facility]   Culture     Final   Value: STAPHYLOCOCCUS AUREUS     Note: RIFAMPIN AND GENTAMICIN SHOULD NOT BE USED AS SINGLE DRUGS FOR TREATMENT OF STAPH INFECTIONS.     Note: Gram Stain Report Called to,Read Back By and Verified With: Junius Argyle 01/08/14 AT 0100 RIDK     Performed at [facility]   Report  Status 01/10/2014 FINAL   Final   Organism ID, Bacteria STAPHYLOCOCCUS AUREUS   Final  CULTURE, BLOOD (ROUTINE X 2)     Status: None   Collection Time    01/06/14  3:45 PM      Result Value Ref Range Status   Specimen Description BLOOD LEFT HAND   Final   Special Requests     Final   Value: BOTTLES DRAWN AEROBIC AND ANAEROBIC 10CC AER 5CC ANA   Culture  Setup Time     Final   Value: 01/06/2014 19:00     Performed at [facility]   Culture     Final   Value: NO GROWTH 5 DAYS     Performed at [facility]   Report Status 01/13/2014 FINAL   Final  CULTURE, BLOOD (ROUTINE X 2)     Status: None   Collection Time    01/08/14  6:56 AM      Result Value Ref Range Status   Specimen Description BLOOD LEFT HAND   Final   Special Requests BOTTLES DRAWN AEROBIC AND ANAEROBIC 10CC   Final   Culture  Setup Time     Final   Value: 01/08/2014 10:12     Performed at [facility]   Culture     Final   Value: NO GROWTH 5 DAYS     Performed at [facility]   Report Status 01/14/2014 FINAL   Final  CULTURE, BLOOD (ROUTINE X 2)     Status: None   Collection Time    01/08/14  7:05 AM      Result Value Ref Range Status   Specimen Description BLOOD BLOOD LEFT FOREARM   Final   Special Requests BOTTLES DRAWN AEROBIC ONLY [facility]   Final   Culture  Setup Time     Final   Value: 01/08/2014 10:12     Performed at [facility]   Culture     Final   Value: NO GROWTH 5 DAYS     Performed at [facility]   Report Status 01/14/2014 FINAL   Final  CULTURE, BLOOD (ROUTINE X 2)     Status: None   Collection Time    01/10/14 12:47 AM  Result Value Ref Range Status   Specimen Description BLOOD LEFT FOREARM   Final   Special Requests     Final   Value: BOTTLES DRAWN AEROBIC AND ANAEROBIC BLUE 10 CC RED 5 CC   Culture  Setup Time     Final   Value: 01/11/2014 13:10     Performed at [facility]   Culture     Final   Value: NO GROWTH 5 DAYS      Performed at [facility]   Report Status 01/17/2014 FINAL   Final  CULTURE, BLOOD (ROUTINE X 2)     Status: None   Collection Time    01/10/14 12:53 AM      Result Value Ref Range Status   Specimen Description BLOOD LEFT HAND   Final   Special Requests BOTTLES DRAWN AEROBIC AND ANAEROBIC 10 CC   Final   Culture  Setup Time     Final   Value: 01/11/2014 13:10     Performed at [facility]   Culture     Final   Value: NO GROWTH 5 DAYS     Performed at [facility]   Report Status 01/17/2014 FINAL   Final    Medical History: Past Medical History  Diagnosis Date  . Stroke   . Meningitis   . HIV (human immunodeficiency virus infection) 1981  . Hypertension   . Gout   . Muscle spasms of head and/or neck   . CHF (congestive heart failure)     [name] 06/18/2013  . HCV (hepatitis C virus)     chronic/notes 06/18/2013  . Type II diabetes mellitus     [name] 06/18/2013  . AIHA (autoimmune hemolytic anemia)     [name] 06/18/2013  . Hypertensive encephalopathy ~ 05/2013    hospitalaized/notes 06/18/2013  . Exertional shortness of breath     [name] 06/18/2013  . Anxiety     [name] 06/18/2013  . History of syphilis   . High cholesterol   . CKD (chronic kidney disease), stage III   . Nephrotic syndrome   . Cirrhosis     hepatitis C   . GERD (gastroesophageal reflux disease)   . Migraine     "all the time" (11/27/2013)  . Seizures     "last sz was week before last" (11/27/2013)  . Psychosis   . Chronic venous stasis dermatitis of both lower extremities     Medications:  Scheduled:  . heparin  5,000 Units Subcutaneous 3 times per day   Assessment: 53yo female with MSSA bacteremia and R/O HCAP/aspiration pneumonia, to continue Vancomycin and Zosyn which she has been receiving at [facility].  Her last dose of Zosyn was ~11AM.  She was also on Vancomycin 1500mg  IV q24, next dose due at 8PM.  Her Cr 2.1 on 8/23, which is improved from 2.72 on [facility] admit  earlier this month.  Goal of Therapy:  Vancomycin trough level 15-20 mcg/ml  Plan:  1-  Zosyn 3.375g IV q8, infuse over 4hr 2-  Check random Vancomycin level now  Marisue Humble, PharmD Clinical Pharmacist [facility]

## 2014-01-20 NOTE — Progress Notes (Signed)
IInternal Medicine Teaching Service Transfer Note  Date: 01/20/2014               Patient Name:  Michelle [name] MRN: [id number]  DOB: 11/03/1960 Age / Sex: 53 y.o., female   PCP: Christen Bame, MD         Medical Service: Internal Medicine Teaching Service         Attending Physician: Dr. Burns Spain, MD    First Contact: Lillia Carmel Pager: [phone]  Second Contact: Dr. Garald Braver Pager: [phone]       After Hours (After 5p/  First Contact Pager: [phone]  weekends / holidays): Second Contact Pager: [phone]   Chief Complaint: SOB  Hospital Course: [name] is a 53 year old female with extensive PMH including HIV and Hep C with recent hospital admission and discharge on 8/19 for Sepsis 2/2 to MSSA bacteremia (discharged with PICC line for IV Ancef), uncontrolled HTN, seizure disorder, and anemia who presented to [facility] from SNF on 8/23 for worsening SOB and chest pain x4 days.  At that time on admission she was found to have severely elevated bP 241/112, rales and rhonchi on physical exam, and confusion.  Hb on admission was 6.8 and Cr was down to 2.2 from 2.72 on prior discharge.  ABG on admission:7.43/36/49/23.9 with ALP 743 and proBNP 8760 (1083 in July 2015).  Portable CXR showed patchy atelectasis or infiltrate in left midlung and right base.   Thus, she was admitted for acute hypoxic respiratory failure thought to be secondary to HCAP vs. Aspiration PNA.  Blood and sputum cultures were drawn and she was started on IV Zosyn, Levaquin, and Vancomycin.    Chronic anemia--She was also transfused 1 unit PRBC with negative FOBT.  HTN: Clonidine was restarted and IV hydralazine was written as prn.  Hepatic encephalopathy--lactulose was restarted q6h  EKG from hospital notes without a date shows 76bpm, NSR, TWI in lead III, with ?flattening in aVF and V3  Per patient's request, she was transferred to [facility] and to our service for further care.   Today, she is in good spirits, not in  any acute distress and without any complaints.  She reports having a BM this morning and no dysuria. Currently no CP and says her SOB has gotten much better.  She is alert, awake, and oriented to person, place, and day but not date.  She was on 2L [place] with o2 sat 100% and did not desat when taken off oxygen.  Her only complaint is itching.   Meds: Current Facility-Administered Medications  Medication Dose Route Frequency Provider Last Rate Last Dose  . heparin injection 5,000 Units  5,000 Units Subcutaneous 3 times per day Baltazar Apo, MD       Allergies: Allergies as of 01/20/2014 - Review Complete 01/20/2014  Allergen Reaction Noted  . Ceftriaxone Other (See Comments) 06/01/2013  . Morphine and related Hives, Itching, and Rash 02/15/2013  . Norvasc [amlodipine besylate] Hives, Itching, and Rash 03/30/2013   Past Medical History  Diagnosis Date  . Stroke   . Meningitis   . HIV (human immunodeficiency virus infection) 1981  . Hypertension   . Gout   . Muscle spasms of head and/or neck   . CHF (congestive heart failure)     [name] 06/18/2013  . HCV (hepatitis C virus)     chronic/notes 06/18/2013  . Type II diabetes mellitus     [name] 06/18/2013  . AIHA (autoimmune hemolytic anemia)     /  notes 06/18/2013  . Hypertensive encephalopathy ~ 05/2013    hospitalaized/notes 06/18/2013  . Exertional shortness of breath     [name] 06/18/2013  . Anxiety     [name] 06/18/2013  . History of syphilis   . High cholesterol   . CKD (chronic kidney disease), stage III   . Nephrotic syndrome   . Cirrhosis     hepatitis C   . GERD (gastroesophageal reflux disease)   . Migraine     "all the time" (11/27/2013)  . Seizures     "last sz was week before last" (11/27/2013)  . Psychosis   . Chronic venous stasis dermatitis of both lower extremities    Past Surgical History  Procedure Laterality Date  . Hip pinning Right 1980's  . Av fistula placement Right 07/24/2013    Procedure: RIGHT arm  exploration of antecubital space;  Surgeon: Sherren Kerns, MD;  Location: [facility];  Service: Vascular;  Laterality: Right;  . Portacath placement  11/27/2013  . Colonoscopy N/A 12/19/2013    Procedure: COLONOSCOPY;  Surgeon: Iva Boop, MD;  Location[facility] ENDOSCOPY;  Service: Endoscopy;  Laterality: N/A;  . Esophagogastroduodenoscopy N/A 12/19/2013    Procedure: ESOPHAGOGASTRODUODENOSCOPY (EGD);  Surgeon: Iva Boop, MD;  Location: [facility] ENDOSCOPY;  Service: Endoscopy;  Laterality: N/A;  . Tee without cardioversion N/A 01/10/2014    Procedure: TRANSESOPHAGEAL ECHOCARDIOGRAM (TEE);  Surgeon: Thurmon Fair, MD;  Location: [facility] ENDOSCOPY;  Service: Cardiovascular;  Laterality: N/A;   Family History  Problem Relation Age of Onset  . Cancer - Colon Mother   . Cancer Father   . Hypertension Father   . Diabetes    . Diabetes Sister    History   Social History  . Marital Status: Single    Spouse Name: N/A    Number of Children: 4  . Years of Education: 11th   Occupational History  . Not on file.   Social History Main Topics  . Smoking status: Former Smoker    Types: Cigarettes    Quit date: 06/19/2010  . Smokeless tobacco: Never Used  . Alcohol Use: No  . Drug Use: Yes    Special: Cocaine, Marijuana, "Crack" cocaine     Comment: past history of alcohol, cocaine and IV drug use; "stopped drugs in 2012"  . Sexual Activity: Not Currently    Partners: Male   Other Topics Concern  . Not on file   Social History Narrative   Patient lives at home with sister.    Patient is unemployed.    Patient is single.    Patient has 2 alive and 2 deceased.    Patient has 11th grade education.          Review of Systems:  Constitutional:  Denies fever, chills  HEENT:  Denies congestion  Respiratory:  SOB  Cardiovascular:  Denies chest pain. Does have mild leg swelling.   Gastrointestinal:  Denies nausea, vomiting, abdominal pain, diarrhea, constipation  Genitourinary:  Denies dysuria   Musculoskeletal:  Chronic R leg weakness  Skin:  Itchy and dry  Neurological:  Seizures   Physical Exam: Blood pressure 172/79, pulse 73, temperature 97.5 F (36.4 C), temperature source Oral, resp. rate 28, height 5' 5.5" (1.664 m), weight 190 lb 11.2 oz (86.5 kg), SpO2 100.00%. Vitals reviewed. General: resting in bed, NAD, on 2L [place] O2 HEENT: EOMI, b/l scleral icterus Cardiac: RRR Pulm: poor effort, decreased breath sounds in bases, no obvious wheezing appreciated Abd: soft, nontender, BS present  Ext: moving all 4 extremities, +1 pitting edema lower extremities more in dependent areas of thighs b/l, shiny extremities with skin discoloration, -tenderness to palpation Neuro: alert and oriented to person, place, and day, cranial nerves II-XII grossly intact, strength and sensation to light touch equal in bilateral upper and lower extremities   Lab results: CBC 8/24: Wbc 7.1, Hb 8.3, Hct 26.4, Plt 182  BMET 8/24: Na 136, K4.2, Cl 107, CO2 20, BUN 33, Cr 2.1, Glucose 323  Urine Drug Screen: Drugs of Abuse     Component Value Date/Time   LABOPIA NONE DETECTED 01/03/2014 2311   LABOPIA NEG 11/13/2013 1516   COCAINSCRNUR NONE DETECTED 01/03/2014 2311   COCAINSCRNUR NEG 11/13/2013 1516   LABBENZ NONE DETECTED 01/03/2014 2311   LABBENZ NEG 11/13/2013 1516   AMPHETMU NONE DETECTED 01/03/2014 2311   THCU NONE DETECTED 01/03/2014 2311   LABBARB NONE DETECTED 01/03/2014 2311   LABBARB NEG 11/13/2013 1516    Imaging results:  pCXR report 8/23: Borderline cardiomegaly, RIJ central line with tip in R atrium, no pulmonary edema or pneumothorax. Patchy atelectasis or infiltrate in left midlung and right base.  Other results: EKG: pending  Assessment & Plan by Problem: Principal Problem:   HCAP (healthcare-associated pneumonia) Active Problems:   Seizure   Unspecified essential hypertension   HIV disease   CKD (chronic kidney disease) stage 4, GFR 15-29 ml/min   Diabetes   Anemia of chronic  disease   HCV (hepatitis C virus)   Elevated alkaline phosphatase level   Bacteremia due to Staphylococcus, MSSA  Ms. Kasparian is a 53 year old female with extensive PMH notable for HIV, Hep C, hepatic encephalopathy, seizure disorder, uncontrolled HTN, DM2, and recent hospital admission for MSSA bacteremia transferred from [facility] hospital to [facility] for acute hypoxic respiratory failure 2/2 HCAP.   Acute hypoxic respiratory failure 2/2 HCAP with initial concern for aspiration in the setting of seizures--Respiratory status appears much improved. pCXR on admission notable for patchy atelectasis or infiltrate in left midling and right base. She was started on IV Vanc, Zosyn, and Levaquin. No leukocytosis or fever apparent on admission per record review thus far. Sputum and blood cultures pending. Currently on 2L [place] O2 with sat >95%.  -transferred to SDU, likely transfer to med/surg tomorrow -wean off oxygen as tolerated -continue IV vancomycin and zosyn for now (allergy noted to ceftriaxone but was tolerating Ancef) -discussed with ID (Dr. Daiva Eves on the phone tonight) in regards to antibiotic coverage given ongoing need for treatment of MSSA bacteremia as well -follow up blood and sputum cultures--will need to try to obtain from [facility] -aspiration precautions -consider repeat CXR or 2 view if worsening -may need to resume PT  MSSA bacteremia--PICC line in place. She was supposed to continue Ancef until February 03, 2014.  On vanc, zosyn, and levaquin at [facility].   -continue Vanc and Zosyn for now  Uncontrolled HTN: BP 172/79 at this time.   -Continued clonidine 0.3mg  TID and labetalol  bid  -Lisinopril and Lasix have been held since last admission for renal function. Given improved Cr, could consider restarted ACEi. Recent d/c summary says could consider clonidine patch for increased compliance and transitioned to metoprolol instead of labetalol.   Chronic Anemia: Baseline Hg  of 6-8. Hb on this admission was 6.8 and she appears to have been transfused, however no record of how many units per d/c summary at this time.  Hb was apparently 8.3 today.  She was started  on Aranesp last hospitalization to continue q2-3 weeks. Thought to be multifactorial with bone marrow suppression due to ART therapy and chronic anemia 2/2 to her CKD. Per Dr. Cyndie Chime from Hematology/oncology PRBC transfusions only recommended if she is symptomatic.  -Check CBC  Chronic Renal Failure: Baseline Cr ~2.2. Cr today was 2.1 at outside hospital.  -Strict in and outs -Holding Lasix and ACEi  -Check BMET  Hepatic Encephalopathy: Appears to be stable at this time -Restarted lactulose, titrate to 3BM/day   History of seizures: known to have pseudoseizures on last hospital admission. -Continue vimpat  -Continue Keppra   HIV disease: Appears well controlled with last CD4 count of 240 01/12/2014.  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   -AM INR and CBC -Consider outpatient Hep C treatment with ID -Check CMET (hx of elevated ALP)  DM2: Last Hg A1C 7.5 on 11/13/13. Home dose Lantus 30 units qhs with sliding scale for novolog.   -start with Lantus 20 units qhs for now and titrate dose up as needed -Sensitive SSI with CBG ACHS  DVT prophylaxis: SCDs  Diet:HH and Carb mod  Dispo: Disposition is deferred at this time, awaiting improvement of current medical problems. Anticipated discharge in approximately 2-3 day(s).   The patient does have a current PCP Christen Bame, MD) and does need an [facility] hospital follow-up appointment after discharge.  The patient does not have transportation limitations that hinder transportation to clinic appointments.  Signed: Baltazar Apo, MD 01/20/2014, 6:09 PM

## 2014-01-20 NOTE — Progress Notes (Signed)
MEDICATION RELATED CONSULT NOTE - INITIAL   Pharmacy Consult for Keppra Indication: Seizure disorder  Allergies  Allergen Reactions  . 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 Morphine And Related Hives, Itching and Rash  . Norvasc [Amlodipine Besylate] Hives, Itching and Rash    Patient Measurements: Height: 5' 5.5" (166.4 cm) Weight: 190 lb 11.2 oz (86.5 kg) IBW/kg (Calculated) : 58.15 Adjusted Body Weight:   Vital Signs: Temp: 97.5 F (36.4 C) (08/24 1610) Temp src: Oral (08/24 1610) BP: 172/79 mmHg (08/24 1610) Pulse Rate: 73 (08/24 1610) Intake/Output from previous day:   Intake/Output from this shift:    Labs:  Recent Labs  01/20/14 1800  WBC 7.6  HGB 9.2*  HCT 28.2*  PLT 205  CREATININE 2.17*  ALBUMIN 1.2*  PROT 6.5  AST 47*  ALT 9  ALKPHOS 585*  BILITOT 3.6*   Estimated Creatinine Clearance: 32.5 ml/min (by C-G formula based on Cr of 2.17).   Microbiology: Recent Results (from the past 720 hour(s))  MRSA PCR SCREENING     Status: Abnormal   Collection Time    01/04/14  4:35 AM      Result Value Ref Range Status   MRSA by PCR POSITIVE (*) NEGATIVE Final   Comment:            The GeneXpert MRSA Assay (FDA     approved for NASAL specimens     only), is one component of a     comprehensive MRSA colonization     surveillance program. It is not     intended to diagnose MRSA     infection nor to guide or     monitor treatment for     MRSA infections.     RESULT CALLED TO, READ BACK BY AND VERIFIED WITH:     R.ZELLNER,RN [id number] 01/04/14 M.CAMPBELL  CULTURE, BLOOD (ROUTINE X 2)     Status: None   Collection Time    01/05/14  3:30 PM      Result Value Ref Range Status   Specimen Description BLOOD LEFT ARM   Final   Special Requests     Final   Value: BOTTLES DRAWN AEROBIC AND ANAEROBIC 10CC BLUE, 5CC RED   Culture  Setup Time     Final   Value: 01/05/2014 22:48     Performed at  [facility]   Culture     Final   Value: STAPHYLOCOCCUS AUREUS     Note: RIFAMPIN AND GENTAMICIN SHOULD NOT BE USED AS SINGLE DRUGS FOR TREATMENT OF STAPH INFECTIONS.     Note: Gram Stain Report Called to,Read Back By and Verified With: Volanda Napoleon RN on 01/06/14 at 06:45 by Christie Nottingham     Performed at [facility]   Report Status 01/08/2014 FINAL   Final   Organism ID, Bacteria STAPHYLOCOCCUS AUREUS   Final  CULTURE, BLOOD (ROUTINE X 2)     Status: None   Collection Time    01/05/14  3:42 PM      Result Value Ref Range Status   Specimen Description BLOOD BLOOD LEFT FOREARM   Final   Special Requests BOTTLES DRAWN AEROBIC ONLY 5CC   Final   Culture  Setup Time     Final   Value: 01/05/2014 22:48     Performed at [facility]   Culture     Final   Value: STAPHYLOCOCCUS AUREUS  Note: SUSCEPTIBILITIES PERFORMED ON PREVIOUS CULTURE WITHIN THE LAST 5 DAYS.     Note: Gram Stain Report Called to,Read Back By and Verified With: Volanda Napoleon RN on 01/06/14 at 06:45 by Christie Nottingham     Performed at [facility]   Report Status 01/08/2014 FINAL   Final  CULTURE, BLOOD (ROUTINE X 2)     Status: None   Collection Time    01/06/14  3:35 PM      Result Value Ref Range Status   Specimen Description BLOOD LEFT ANTECUBITAL   Final   Special Requests     Final   Value: BOTTLES DRAWN AEROBIC AND ANAEROBIC 10CC AER 5CC ANA   Culture  Setup Time     Final   Value: 01/06/2014 19:02     Performed at [facility]   Culture     Final   Value: STAPHYLOCOCCUS AUREUS     Note: RIFAMPIN AND GENTAMICIN SHOULD NOT BE USED AS SINGLE DRUGS FOR TREATMENT OF STAPH INFECTIONS.     Note: Gram Stain Report Called to,Read Back By and Verified With: Junius Argyle 01/08/14 AT 0100 RIDK     Performed at [facility]   Report Status 01/10/2014 FINAL   Final   Organism ID, Bacteria STAPHYLOCOCCUS AUREUS   Final  CULTURE, BLOOD (ROUTINE X 2)     Status: None    Collection Time    01/06/14  3:45 PM      Result Value Ref Range Status   Specimen Description BLOOD LEFT HAND   Final   Special Requests     Final   Value: BOTTLES DRAWN AEROBIC AND ANAEROBIC 10CC AER 5CC ANA   Culture  Setup Time     Final   Value: 01/06/2014 19:00     Performed at [facility]   Culture     Final   Value: NO GROWTH 5 DAYS     Performed at [facility]   Report Status 01/13/2014 FINAL   Final  CULTURE, BLOOD (ROUTINE X 2)     Status: None   Collection Time    01/08/14  6:56 AM      Result Value Ref Range Status   Specimen Description BLOOD LEFT HAND   Final   Special Requests BOTTLES DRAWN AEROBIC AND ANAEROBIC 10CC   Final   Culture  Setup Time     Final   Value: 01/08/2014 10:12     Performed at [facility]   Culture     Final   Value: NO GROWTH 5 DAYS     Performed at [facility]   Report Status 01/14/2014 FINAL   Final  CULTURE, BLOOD (ROUTINE X 2)     Status: None   Collection Time    01/08/14  7:05 AM      Result Value Ref Range Status   Specimen Description BLOOD BLOOD LEFT FOREARM   Final   Special Requests BOTTLES DRAWN AEROBIC ONLY [facility]   Final   Culture  Setup Time     Final   Value: 01/08/2014 10:12     Performed at [facility]   Culture     Final   Value: NO GROWTH 5 DAYS     Performed at [facility]   Report Status 01/14/2014 FINAL   Final  CULTURE, BLOOD (ROUTINE X 2)     Status: None   Collection Time    01/10/14 12:47 AM  Result Value Ref Range Status   Specimen Description BLOOD LEFT FOREARM   Final   Special Requests     Final   Value: BOTTLES DRAWN AEROBIC AND ANAEROBIC BLUE 10 CC RED 5 CC   Culture  Setup Time     Final   Value: 01/11/2014 13:10     Performed at [facility]   Culture     Final   Value: NO GROWTH 5 DAYS     Performed at [facility]   Report Status 01/17/2014 FINAL   Final  CULTURE, BLOOD (ROUTINE X 2)     Status: None   Collection Time     01/10/14 12:53 AM      Result Value Ref Range Status   Specimen Description BLOOD LEFT HAND   Final   Special Requests BOTTLES DRAWN AEROBIC AND ANAEROBIC 10 CC   Final   Culture  Setup Time     Final   Value: 01/11/2014 13:10     Performed at [facility]   Culture     Final   Value: NO GROWTH 5 DAYS     Performed at [facility]   Report Status 01/17/2014 FINAL   Final    Medical History: Past Medical History  Diagnosis Date  . Stroke   . Meningitis   . HIV (human immunodeficiency virus infection) 1981  . Hypertension   . Gout   . Muscle spasms of head and/or neck   . CHF (congestive heart failure)     [name] 06/18/2013  . HCV (hepatitis C virus)     chronic/notes 06/18/2013  . Type II diabetes mellitus     [name] 06/18/2013  . AIHA (autoimmune hemolytic anemia)     [name] 06/18/2013  . Hypertensive encephalopathy ~ 05/2013    hospitalaized/notes 06/18/2013  . Exertional shortness of breath     [name] 06/18/2013  . Anxiety     [name] 06/18/2013  . History of syphilis   . High cholesterol   . CKD (chronic kidney disease), stage III   . Nephrotic syndrome   . Cirrhosis     hepatitis C   . GERD (gastroesophageal reflux disease)   . Migraine     "all the time" (11/27/2013)  . Seizures     "last sz was week before last" (11/27/2013)  . Psychosis   . Chronic venous stasis dermatitis of both lower extremities     Medications:  Scheduled:  . abacavir  600 mg Oral Daily  . [START ON 01/21/2014] acyclovir  800 mg Oral Daily  . aspirin EC  81 mg Oral Daily  . cloNIDine  0.3 mg Oral TID  . [START ON 01/21/2014] Darunavir Ethanolate  800 mg Oral Q breakfast  . hydrocerin  1 application Topical BID  . [START ON 01/21/2014] insulin aspart  0-9 Units Subcutaneous TID WC  . insulin glargine  20 Units Subcutaneous QHS  . labetalol  600 mg Oral BID  . lacosamide  150 mg Oral BID  . lactulose  20 g Oral TID  . lamiVUDine  100 mg Oral Daily  . pantoprazole  40 mg  Oral Daily  . piperacillin-tazobactam (ZOSYN)  IV  3.375 g Intravenous NOW  . [START ON 01/21/2014] piperacillin-tazobactam (ZOSYN)  IV  3.375 g Intravenous 3 times per day  . [START ON 01/21/2014] ritonavir  100 mg Oral Q breakfast  . sodium bicarbonate  650 mg Oral BID    Assessment: 53yo female with seizure  disorder on large dose of Keppra  po bid, which is a large dose for her degree of renal dysfunction.  However pt has been tolerating this and has not had any seizures.  Per MD note this PM, she is in good spirits as well as alert, awake, & oriented to person/place.  Goal of Therapy:  Levertiracetam level 12-46  Plan:  1-  Keppra  PO bid as at home 2-  Keppra level tonight, which is a send-out lab and will not result until Thursday (most likely) 3-  Adjust Keppra dosing based on lab result  Marisue Humble, PharmD Clinical Pharmacist [facility]

## 2014-01-20 NOTE — H&P (Signed)
Date: 01/20/2014               Patient Name:  Michelle [name] MRN: [id number]  DOB: September 02, 1960 Age / Sex: 53 y.o., female   PCP: Christen Bame, MD              Medical Service: Internal Medicine Teaching Service              Attending Physician: Dr. Burns Spain, MD    First Contact: Lillia Carmel, MS 4 Pager: [phone]  Second Contact: Dr. 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 She states that the SOB has lasted for the past four days and has been accompanied by chest pain per OSH report. She is unable to describe the chest pain, but states that it has resolved. She also states that she is able to perform her ADLs regardless of SOB. She has been at a SNF in [place] since being discharged on Aug 21st. She presented to an OSH on 8/23 for SOB and a portable chest x-[place] was concerning for atelectasis vs infiltrate of left midlung and right base. Sputum and blood cultures were drawn and she was started on IV vancomycin, zosyn, and levaquin for concern of HCAP vs aspiration pneumonia. Denies fever, cough, headache, abdominal pain, n/v, and changes in BM. Also denies leg edema.   She was transferred to [facility] per patient's request.   Meds: No current facility-administered medications for this encounter.    Allergies: Allergies as of 01/20/2014 - Review Complete 01/10/2014  Allergen Reaction Noted  . Ceftriaxone Other (See Comments) 06/01/2013  . Morphine and related Hives, Itching, and Rash 02/15/2013  . Norvasc [amlodipine besylate] Hives, Itching, and Rash 03/30/2013   Past Medical  History  Diagnosis Date  . Stroke   . Meningitis   . HIV (human immunodeficiency virus infection) 1981  . Hypertension   . Gout   . Muscle spasms of head and/or neck   . CHF (congestive heart failure)     Michelle [name] 06/18/2013  . HCV (hepatitis C virus)     chronic/notes 06/18/2013  . Type II diabetes mellitus     Michelle [name] 06/18/2013  . AIHA (autoimmune hemolytic anemia)     Michelle [name] 06/18/2013  . Hypertensive encephalopathy ~ 05/2013    hospitalaized/notes 06/18/2013  . Exertional shortness of breath     Michelle [name] 06/18/2013  . Anxiety     Michelle [name] 06/18/2013  . History of syphilis   . High cholesterol   . CKD (chronic kidney disease), stage III   . Nephrotic syndrome   . Cirrhosis     hepatitis C   . GERD (gastroesophageal reflux disease)   . Migraine     "all the time" (11/27/2013)  . Seizures     "last sz was week before last" (11/27/2013)  . Psychosis   . Chronic venous stasis dermatitis of both lower extremities    Past Surgical History  Procedure Laterality Date  . Hip pinning Right 1980's  . Av fistula placement Right 07/24/2013    Procedure: RIGHT arm  exploration of antecubital space;  Surgeon: Sherren Kerns, MD;  Location: [facility] OR;  Service: Vascular;  Laterality: Right;  . Portacath placement  11/27/2013  . Colonoscopy N/A 12/19/2013    Procedure: COLONOSCOPY;  Surgeon: Iva Boop, MD;  Location: [facility] ENDOSCOPY;  Service: Endoscopy;  Laterality: N/A;  . Esophagogastroduodenoscopy N/A 12/19/2013    Procedure: ESOPHAGOGASTRODUODENOSCOPY (EGD);  Surgeon: Iva Boop, MD;  Location: [facility] ENDOSCOPY;  Service: Endoscopy;  Laterality: N/A;  . Tee without cardioversion N/A 01/10/2014    Procedure: TRANSESOPHAGEAL ECHOCARDIOGRAM (TEE);  Surgeon: Thurmon Fair, MD;  Location: [facility] ENDOSCOPY;  Service: Cardiovascular;  Laterality: N/A;   Family History  Problem Relation Age of Onset  . Cancer - Colon Mother   . Cancer Father   . Hypertension Father   . Diabetes    . Diabetes Sister     History   Social History  . Marital Status: Single    Spouse Name: N/A    Number of Children: 4  . Years of Education: 11th   Occupational History  . Not on file.   Social History Main Topics  . Smoking status: Former Smoker    Types: Cigarettes    Quit date: 06/19/2010  . Smokeless tobacco: Never Used  . Alcohol Use: No  . Drug Use: Yes    Special: Cocaine, Marijuana, "Crack" cocaine     Comment: past history of alcohol, cocaine and IV drug use; "stopped drugs in 2012"  . Sexual Activity: Not Currently    Partners: Male   Other Topics Concern  . Not on file   Social History Narrative   Patient lives at home with sister.    Patient is unemployed.    Patient is single.    Patient has 2 alive and 2 deceased.    Patient has 11th grade education.           Review of Systems: Respiratory: positive for cough, dyspnea on exertion and pneumonia Cardiovascular: negative for chest pain and chest pressure/discomfort Gastrointestinal: negative for abdominal pain and change in bowel habits Musculoskeletal:negative for back pain Neurological: negative for dizziness and headaches   Physical Exam: Blood pressure 172/79, pulse 73, temperature 97.5 F (36.4 C), temperature source Oral, resp. rate 28, height 5' 5.5" (1.664 m), weight 86.5 kg (190 lb 11.2 oz), SpO2 100.00%. General appearance: alert, no distress and mildly obese Eyes: scleral icterus, PERRL, EOM's intact Neck: no adenopathy, no carotid bruit, no JVD  Lungs: egophony and whispered pectoriloquy of LLL, no crackles, rales, or rhonchi Heart: regular rate and rhythm, S1, S2 normal, no murmur, click, rub or gallop Abdomen: soft, non-tender, mildly distended, and obese, no abdominal pain on palpation Extremities: trace edema bilaterally to upper thighs, no redness or tenderness in calves on palpation, 5/5 UE strength, 5/5 grip strength, 4/5 R LE strength, 5/5 L LE strength Neurologic:  Alert, oriented to person and  place, thought content appropriate, thought process disjointed at times Coordination: finger to nose normal in midline  Imaging results:  No results found.  Other results: EKG: from OSH was NSR with LVH  Assessment & Plan by Problem: Ms. 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  Shortness of Breath concerning for Bacterial Pneumonia: She claims the SOB has been since being discharged from the hospital putting her at risk  for HCAP vs aspiration pneumonia (hx of seizures). More concerning for HCAP due to risk factors (hospitalized for 2 or more days within the past 90 days (discharged on 8/21), resident of a nursing home, recipient of recent intravenous antibiotic therapy). OSH chest x-[place] showed patchy atelectasis or infiltrates in the left midlung and right lung base. It is unclear when her last seizure was. Her O2 saturation has been 100% on 2L [place] since admission, although was reported that she had O2 sats around 40% at her SNF (unverified). She does not appear volume overloaded on exam, but whispered pecteriloquy + as well. Afebrile and has normal WBC count, but still high suspicion for infectious process given immunocompromised state and recent medical hx. -CBC and BMP in AM -F/U OSH sputum and blood cultures -Continue IV Vanc and Zosyn -Discontinued IV Levaquin -Continue pulse ox and oxygen as required (>92% O2 sats) -Keep on telemetry  MSSA Bacteremia: She is currently on antibiotic day 14 of her IV Cefazolin. Her temperature on admission in 97.5.  Her mental status is at baseline. She had 2/2 blood cultures on 8/10 that grew MSSA. The source of her bacteremia was likely her port-a-cath placed on 12/05/13, and this was removed on 8/11. A right IJ PICC was placed on 8/18.  -Will continue IV cefazolin  (now day 14) for 4 weeks total   Acute on Chronic Anemia: Asymptomatic. Her baseline Hg is between 6-8. Her Hg at [facility] yesterday was 6.8.  Dr. Cyndie Chime in Hematology recommends pRBC transfusion only if she is symptomatic. She received aranesp on 8/15 and the remaining portion of her dose on 8/18. Will give the patient Aranesp 276mcg-300mcg every 2-3 weeks. In the past, she had an extensive work up for hemolytic anemia, anemia of chronic disease, with EGD and colonoscopy on 7/23 that were unremarkable for potential bleeding sources. 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 Drug induced anemia by hydralazine has also been proposed as a contributing factor since her ANA titer increased with the use of this medication.  -Will transfuse if HgB<6 or if she becomes symptomatic  -Aranesp on Sept 1  HTN: Current meds include clonidine 0.3mg  TID and labetalol  BID. Currently at 179/81. She also has had ANA positive with titers that increased from 1:40 to 1:80 after the use of hydralazine. Will restart hydalazine  TID due to persistent HTN. Will not give ACEi due to CRF. -labetalol  BID  -Hydralazine  TID  -Holding lisinopril  daily due to CRF and recent AKI on last admission  Chronic Renal Failure: Recent baseline Cr in the 2.2-2.7 range. Cr today is 2.17. UOP has been good/adequate. Monitor UOP.  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 Secondary to Hepatitis C infection. She has no signs or symptoms of hepatic encephalopathy or SBP. Has small amount of ascites per recent CT abdomen/pelvis, but no acute intraabdominal process. -Will consider diagnostic/therapeutic tap of ascitic fluid if she declines  -Continue home lactulose PO PRN titrated to 3BM/day   History of seizure: Stable. Dilantin was discontinued on last admission due to possible  contributor to AKI. Has a history of pseudoseizures with voluntary jerking movements while following commands and remaining fully conversant. Will monitor and continue home seizure medications.  -Continue Vimpat  -Continue Keppra   HIV disease: Well controlled. Her viral load was rechecked and was undetectable on  01/14/14. Her CD4 count was also checked and had a count of 240. It was at 280 in April 2015.  -Continue home abacavir, prezista, norvir, and epivir  -Continue acyclovir for prophylaxis   Hepatitis C: Genotype in December 2014. Genotype 1b, viral load of [id number], Log of 6.63.  -She will f/u with Dr. Drue Second in ID for possible treatment of Hep C   DMII: Last Hg A1C 7.5% on 11/13/13. On Lantus 30 u qHS and Novolog 10-25 u actid at home.  -Lantus 20 units qHS  -Sensitive SSI   DVT prophylaxis: SCDs   Diet:  -Diet heart healthy/ Carb mod only if fully alert   Dispo: Will await improvement of current medical conditions  This is a Psychologist, occupational Note.  The care of the patient was discussed with Dr. Virgina Organ and the assessment and plan was formulated with their assistance.  Please see their note for official documentation of the patient encounter.   Signed: Lillia Carmel, Med Student 01/20/2014, 4:51 PM

## 2014-01-21 DIAGNOSIS — K7682 Hepatic encephalopathy: Secondary | ICD-10-CM

## 2014-01-21 DIAGNOSIS — J189 Pneumonia, unspecified organism: Principal | ICD-10-CM

## 2014-01-21 DIAGNOSIS — N184 Chronic kidney disease, stage 4 (severe): Secondary | ICD-10-CM

## 2014-01-21 DIAGNOSIS — K7290 Hepatic failure, unspecified without coma: Secondary | ICD-10-CM

## 2014-01-21 LAB — CBC WITH DIFFERENTIAL/PLATELET
Basophils Absolute: 0 10*3/uL (ref 0.0–0.1)
Basophils Relative: 0 % (ref 0–1)
EOS ABS: 0 10*3/uL (ref 0.0–0.7)
Eosinophils Relative: 0 % (ref 0–5)
HCT: 28.5 % — ABNORMAL LOW (ref 36.0–46.0)
Hemoglobin: 9.2 g/dL — ABNORMAL LOW (ref 12.0–15.0)
LYMPHS ABS: 0.7 10*3/uL (ref 0.7–4.0)
Lymphocytes Relative: 9 % — ABNORMAL LOW (ref 12–46)
MCH: 32.4 pg (ref 26.0–34.0)
MCHC: 32.3 g/dL (ref 30.0–36.0)
MCV: 100.4 fL — AB (ref 78.0–100.0)
Monocytes Absolute: 0.7 10*3/uL (ref 0.1–1.0)
Monocytes Relative: 8 % (ref 3–12)
NEUTROS PCT: 83 % — AB (ref 43–77)
Neutro Abs: 6.8 10*3/uL (ref 1.7–7.7)
Platelets: 192 10*3/uL (ref 150–400)
RBC: 2.84 MIL/uL — ABNORMAL LOW (ref 3.87–5.11)
WBC: 8.2 10*3/uL (ref 4.0–10.5)

## 2014-01-21 LAB — GLUCOSE, CAPILLARY
GLUCOSE-CAPILLARY: 447 mg/dL — AB (ref 70–99)
Glucose-Capillary: 392 mg/dL — ABNORMAL HIGH (ref 70–99)
Glucose-Capillary: 305 mg/dL — ABNORMAL HIGH (ref 70–99)
Glucose-Capillary: 129 mg/dL — ABNORMAL HIGH (ref 70–99)

## 2014-01-21 LAB — PROTIME-INR
INR: 3.14 — AB (ref 0.00–1.49)
PROTHROMBIN TIME: 32.3 s — AB (ref 11.6–15.2)

## 2014-01-21 LAB — BASIC METABOLIC PANEL
Anion gap: 15 (ref 5–15)
BUN: 47 mg/dL — ABNORMAL HIGH (ref 6–23)
CALCIUM: 6.7 mg/dL — AB (ref 8.4–10.5)
CO2: 17 mEq/L — ABNORMAL LOW (ref 19–32)
Chloride: 100 mEq/L (ref 96–112)
Creatinine, Ser: 2.56 mg/dL — ABNORMAL HIGH (ref 0.50–1.10)
GFR, EST AFRICAN AMERICAN: 23 mL/min — AB (ref >90)
GFR, EST NON AFRICAN AMERICAN: 20 mL/min — AB (ref >90)
GLUCOSE: 485 mg/dL — AB (ref 70–99)
Potassium: 4.3 mEq/L (ref 3.7–5.3)
Sodium: 132 mEq/L — ABNORMAL LOW (ref 137–147)

## 2014-01-21 MED ORDER — LEVETIRACETAM 750 MG PO TABS
1500.0000 mg | ORAL_TABLET | Freq: Two times a day (BID) | ORAL | Status: DC
  Administered 2014-01-21 – 2014-01-24 (×7): 1500 mg via ORAL
  Filled 2014-01-21 (×8): qty 2

## 2014-01-21 MED ORDER — CEFAZOLIN SODIUM-DEXTROSE 2-3 GM-% IV SOLR
2.0000 g | Freq: Two times a day (BID) | INTRAVENOUS | Status: DC
  Administered 2014-01-21 – 2014-01-24 (×6): 2 g via INTRAVENOUS
  Filled 2014-01-21 (×8): qty 50

## 2014-01-21 MED ORDER — HYDRALAZINE HCL 50 MG PO TABS
50.0000 mg | ORAL_TABLET | Freq: Three times a day (TID) | ORAL | Status: DC
  Administered 2014-01-21 – 2014-01-22 (×4): 50 mg via ORAL
  Filled 2014-01-21 (×6): qty 1

## 2014-01-21 MED ORDER — INSULIN GLARGINE 100 UNIT/ML ~~LOC~~ SOLN
30.0000 [IU] | Freq: Every day | SUBCUTANEOUS | Status: DC
  Administered 2014-01-21 – 2014-01-23 (×3): 30 [IU] via SUBCUTANEOUS
  Filled 2014-01-21 (×6): qty 0.3

## 2014-01-21 MED ORDER — CLONIDINE HCL 0.3 MG/24HR TD PTWK
0.3000 mg | MEDICATED_PATCH | TRANSDERMAL | Status: DC
  Administered 2014-01-21: 0.3 mg via TRANSDERMAL
  Filled 2014-01-21: qty 1

## 2014-01-21 MED ORDER — INSULIN ASPART 100 UNIT/ML ~~LOC~~ SOLN
10.0000 [IU] | Freq: Once | SUBCUTANEOUS | Status: AC
  Administered 2014-01-21: 10 [IU] via SUBCUTANEOUS

## 2014-01-21 MED ORDER — LEVOFLOXACIN 500 MG PO TABS
500.0000 mg | ORAL_TABLET | ORAL | Status: AC
  Administered 2014-01-21 – 2014-01-23 (×2): 500 mg via ORAL
  Filled 2014-01-21 (×2): qty 1

## 2014-01-21 MED ORDER — INSULIN ASPART 100 UNIT/ML ~~LOC~~ SOLN
0.0000 [IU] | Freq: Three times a day (TID) | SUBCUTANEOUS | Status: DC
  Administered 2014-01-21: 11 [IU] via SUBCUTANEOUS
  Administered 2014-01-21: 15 [IU] via SUBCUTANEOUS
  Administered 2014-01-22: 3 [IU] via SUBCUTANEOUS
  Administered 2014-01-23 – 2014-01-24 (×5): 5 [IU] via SUBCUTANEOUS

## 2014-01-21 NOTE — Progress Notes (Signed)
Pt received medications for first shift complete. Pt has orders to go to 5 west medical floor without telemetry. Called and notified central telemetry. Called and gave report to [facility] nurse jennifer. Pt has been notified. Nurse assistant taking pt to floor.  Room 25.

## 2014-01-21 NOTE — Progress Notes (Signed)
Pt is complaining of a bump as she described in inside of left cheek of mouth. Nurse examined and seen a small red area that looked like pt bit inside of cheek with teeth possibly. Reported to Dr. Rogelia Boga.

## 2014-01-21 NOTE — H&P (Signed)
  I have seen and examined the patient, and reviewed the daily progress (NOT H&P) note by Lillia Carmel, MS 4 and discussed the care of the patient with them. Please see my progress note from 01/20/2014 for further details regarding assessment and plan.    Signed:  Baltazar Apo, MD 01/21/2014, 1:33 PM

## 2014-01-21 NOTE — Progress Notes (Signed)
Subjective: Michelle [name] was seen and examined at bedside this morning.  She remains afebrile with no complaints this morning. She had a BM this morning, no signs of respiratory distress. Ready for transfer to floor bed.   Objective: Vital signs in last 24 hours: Filed Vitals:   01/20/14 2100 01/21/14 0000 01/21/14 0200 01/21/14 0421  BP: 197/85 176/114 194/87 207/93  Pulse: 63   61  Temp:  97.8 F (36.6 C)  97.9 F (36.6 C)  TempSrc:  Oral  Oral  Resp: 23   22  Height:      Weight:      SpO2: 100%   100%   Weight change:   Intake/Output Summary (Last 24 hours) at 01/21/14 0717 Last data filed at 01/21/14 0448  Gross per 24 hour  Intake    100 ml  Output    500 ml  Net   -400 ml   Vitals reviewed. General: sitting up in bed, NAD HEENT: EOMI Cardiac: RRR Pulm: mild rales R base, otherwise CTA Abd: soft, BS present Ext: moving all 4 extremities Neuro: alert and oriented X3  Lab Results: Basic Metabolic Panel:  Recent Labs Lab 01/15/14 0500 01/20/14 1800  NA 137 134*  K 3.6* 4.2  CL 101 100  CO2 22 19  GLUCOSE 262* 424*  BUN 44* 38*  CREATININE 2.72* 2.17*  CALCIUM 7.6* 7.2*   Liver Function Tests:  Recent Labs Lab 01/15/14 0500 01/20/14 1800  AST 62* 47*  ALT <5 9  ALKPHOS 669* 585*  BILITOT 3.5* 3.6*  PROT 6.0 6.5  ALBUMIN 1.2* 1.2*   CBC:  Recent Labs Lab 01/15/14 0500 01/20/14 1800  WBC 8.5 7.6  HGB 6.4* 9.2*  HCT 19.7* 28.2*  MCV 106.5* 95.9  PLT 225 205   CBG:  Recent Labs Lab 01/14/14 1641 01/14/14 2159 01/15/14 0752 01/15/14 1157 01/15/14 1721 01/20/14 2142  GLUCAP 285* 275* 278* 305* 312* 459*   Urine Drug Screen: Drugs of Abuse     Component Value Date/Time   LABOPIA NONE DETECTED 01/03/2014 2311   LABOPIA NEG 11/13/2013 1516   COCAINSCRNUR NONE DETECTED 01/03/2014 2311   COCAINSCRNUR NEG 11/13/2013 1516   LABBENZ NONE DETECTED 01/03/2014 2311   LABBENZ NEG 11/13/2013 1516   AMPHETMU NONE DETECTED 01/03/2014 2311   THCU  NONE DETECTED 01/03/2014 2311   LABBARB NONE DETECTED 01/03/2014 2311   LABBARB NEG 11/13/2013 1516    Micro Results: Recent Results (from the past 240 hour(s))  MRSA PCR SCREENING     Status: None   Collection Time    01/20/14  5:28 PM      Result Value Ref Range Status   MRSA by PCR NEGATIVE  NEGATIVE Final   Comment:            The GeneXpert MRSA Assay (FDA     approved for NASAL specimens     only), is one component of a     comprehensive MRSA colonization     surveillance program. It is not     intended to diagnose MRSA     infection nor to guide or     monitor treatment for     MRSA infections.   Medications: I have reviewed the patient's current medications. Scheduled Meds: . abacavir  600 mg Oral Daily  . acyclovir  800 mg Oral Daily  . aspirin EC  81 mg Oral Daily  . cloNIDine  0.3 mg Oral TID  . Darunavir Ethanolate  800 mg Oral Q breakfast  . hydrocerin  1 application Topical BID  . insulin aspart  0-9 Units Subcutaneous TID WC  . insulin glargine  20 Units Subcutaneous QHS  . labetalol  600 mg Oral BID  . lacosamide  150 mg Oral BID  . lactulose  20 g Oral TID  . lamiVUDine  100 mg Oral Daily  . pantoprazole  40 mg Oral Daily  . piperacillin-tazobactam (ZOSYN)  IV  3.375 g Intravenous 3 times per day  . ritonavir  100 mg Oral Q breakfast  . sodium bicarbonate  650 mg Oral BID  . vancomycin  1,500 mg Intravenous Q24H   Continuous Infusions:  PRN Meds:.hydrOXYzine Assessment/Plan: Principal Problem:   HCAP (healthcare-associated pneumonia) Active Problems:   Seizure   Unspecified essential hypertension   HIV disease   CKD (chronic kidney disease) stage 4, GFR 15-29 ml/min   DM2 (diabetes mellitus, type 2)   Anemia of chronic disease   HCV (hepatitis C virus)   Elevated alkaline phosphatase level   Bacteremia due to Staphylococcus, MSSA Michelle [name] is a 53 year old female with extensive PMH notable for HIV, Hep C, hepatic encephalopathy, seizure disorder,  uncontrolled HTN, DM2, and recent hospital admission for MSSA bacteremia transferred from [facility] to [facility] 8/24 for further management of acute hypoxic respiratory failure 2/2 HCAP.   HCAP--remains afebrile.  -transfer to med/surg -continue IV vancomycin and zosyn for now (allergy noted to ceftriaxone but was tolerating Ancef)--will try to narrow to provide coverage for both PNA and bacteremia -follow up blood and sputum cultures from outside hospital -aspiration precautions   MSSA bacteremia--end date with Ancef was September 7th.  -continue Vanc and Zosyn for now but will try to narrow therapy as per above discussion -ID follow up as outpatient  Uncontrolled HTN: SBP elevated this morning  -Continued labetalol  bid, transition to clonidine patch (working on compliance), and start hydralazine  po TID -Consider restarting ACEi if BP remains elevated and if renal function stabilizes or improves  Chronic Anemia: Baseline Hg of 6-8. Hb up to 9.2 on transfer to [facility], likely s/p PRBC transfusions at [facility]  -Aranesp q2-3 weeks as outpatient--last dose appears to be 8/18 -Trend CBC  Chronic Renal Failure: Baseline Cr ~2.2.  -Holding Lasix, may consider restarting ACEi eventually as per above discussion -Trend renal fuction  Hepatic Encephalopathy -will change lactulose to PRN and titrate to 3BM/day   History of seizures  -Continue Vimpat and Keppra   HIV disease: Last CD4 count of 240 01/12/2014.  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 -Consider outpatient Hep C treatment with ID    DM2: Last Hg A1C 7.5 on 11/13/13.  -resume home Lantus 30 units and continue SSI (changed to moderate)  DVT prophylaxis: SCDs  Diet:HH and Carb mod  Dispo: Disposition is deferred at this time, awaiting improvement  of current medical problems. Anticipated discharge in approximately 2-3 day(s).   The patient does have a current PCP Christen Bame, MD) and does need an [facility] hospital follow-up appointment after discharge.  The patient does not have transportation limitations that hinder transportation to clinic appointments.  Services Needed at time of discharge: Y = Yes, Blank = No PT: SNF  OT:   RN:   Equipment:   Other:     LOS: 1 day  Baltazar Apo, MD 01/21/2014, 7:17 AM

## 2014-01-21 NOTE — Progress Notes (Signed)
  I have seen and examined the patient, and reviewed the daily progress note by Lillia Carmel, MS 4 and discussed the care of the patient with them. Please see my progress note from 01/21/2014 for further details regarding assessment and plan.    Signed:  Baltazar Apo, MD 01/21/2014, 5:07 PM

## 2014-01-21 NOTE — Progress Notes (Signed)
Utilization Review Completed.  

## 2014-01-21 NOTE — Progress Notes (Signed)
Pt arrived to 5W25.  Placed in chair per pt request.  Oriented to room.  Will continue to monitor.

## 2014-01-21 NOTE — Progress Notes (Signed)
ANTIBIOTIC CONSULT NOTE  Pharmacy Consult for ancef and po levaquin x 3 days Indication: MSSA bacteremia and complete abx course for HCAP  Allergies  Allergen Reactions  . 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 Morphine And Related Hives, Itching and Rash  . Norvasc [Amlodipine Besylate] Hives, Itching and Rash    Patient Measurements: Height: 5' 5.5" (166.4 cm) Weight: 190 lb 11.2 oz (86.5 kg) IBW/kg (Calculated) : 58.15   Vital Signs: Temp: 97.5 F (36.4 C) (08/25 1209) Temp src: Oral (08/25 1209) BP: 209/93 mmHg (08/25 1209) Pulse Rate: 61 (08/25 1209) Intake/Output from previous day: 08/24 0701 - 08/25 0700 In: 100 [IV Piggyback:100] Out: 750 [Urine:750] Intake/Output from this shift: Total I/O In: 120 [P.O.:120] Out: 100 [Urine:100]  Labs:  Recent Labs  01/20/14 1800 01/21/14 0500  WBC 7.6 8.2  HGB 9.2* 9.2*  PLT 205 192  CREATININE 2.17* 2.56*   Estimated Creatinine Clearance: 27.6 ml/min (by C-G formula based on Cr of 2.56).  Recent Labs  01/20/14 1822  VANCORANDOM 12.5     Microbiology: Recent Results (from the past 720 hour(s))  MRSA PCR SCREENING     Status: Abnormal   Collection Time    01/04/14  4:35 AM      Result Value Ref Range Status   MRSA by PCR POSITIVE (*) NEGATIVE Final   Comment:            The GeneXpert MRSA Assay (FDA     approved for NASAL specimens     only), is one component of a     comprehensive MRSA colonization     surveillance program. It is not     intended to diagnose MRSA     infection nor to guide or     monitor treatment for     MRSA infections.     RESULT CALLED TO, READ BACK BY AND VERIFIED WITH:     R.ZELLNER,RN 1610 01/04/14 M.CAMPBELL  CULTURE, BLOOD (ROUTINE X 2)     Status: None   Collection Time    01/05/14  3:30 PM      Result Value Ref Range Status   Specimen Description BLOOD LEFT ARM   Final   Special Requests     Final   Value: BOTTLES DRAWN AEROBIC AND ANAEROBIC 10CC BLUE, 5CC RED   Culture  Setup Time     Final   Value: 01/05/2014 22:48     Performed at [facility]   Culture     Final   Value: STAPHYLOCOCCUS AUREUS     Note: RIFAMPIN AND GENTAMICIN SHOULD NOT BE USED AS SINGLE DRUGS FOR TREATMENT OF STAPH INFECTIONS.     Note: Gram Stain Report Called to,Read Back By and Verified With: Volanda Napoleon RN on 01/06/14 at 06:45 by Christie Nottingham     Performed at [facility]   Report Status 01/08/2014 FINAL   Final   Organism ID, Bacteria STAPHYLOCOCCUS AUREUS   Final  CULTURE, BLOOD (ROUTINE X 2)     Status: None   Collection Time    01/05/14  3:42 PM      Result Value Ref Range Status   Specimen Description BLOOD BLOOD LEFT FOREARM   Final   Special Requests BOTTLES DRAWN AEROBIC ONLY 5CC   Final   Culture  Setup Time     Final   Value: 01/05/2014 22:48     Performed at [facility]   Culture     Final   Value: STAPHYLOCOCCUS AUREUS     Note: SUSCEPTIBILITIES PERFORMED ON PREVIOUS CULTURE WITHIN THE LAST 5 DAYS.     Note: Gram Stain Report Called to,Read Back By and Verified With: Volanda Napoleon RN on 01/06/14 at 06:45 by Christie Nottingham     Performed at [facility]   Report Status 01/08/2014 FINAL   Final  CULTURE, BLOOD (ROUTINE X 2)     Status: None   Collection Time    01/06/14  3:35 PM      Result Value Ref Range Status   Specimen Description BLOOD LEFT ANTECUBITAL   Final   Special Requests     Final   Value: BOTTLES DRAWN AEROBIC AND ANAEROBIC 10CC AER 5CC ANA   Culture  Setup Time     Final   Value: 01/06/2014 19:02     Performed at [facility]   Culture     Final   Value: STAPHYLOCOCCUS AUREUS     Note: RIFAMPIN AND GENTAMICIN SHOULD NOT BE USED AS SINGLE DRUGS FOR TREATMENT OF STAPH INFECTIONS.     Note: Gram Stain Report Called to,Read Back By and Verified With: Junius Argyle 01/08/14 AT 0100 RIDK     Performed at [facility]   Report  Status 01/10/2014 FINAL   Final   Organism ID, Bacteria STAPHYLOCOCCUS AUREUS   Final  CULTURE, BLOOD (ROUTINE X 2)     Status: None   Collection Time    01/06/14  3:45 PM      Result Value Ref Range Status   Specimen Description BLOOD LEFT HAND   Final   Special Requests     Final   Value: BOTTLES DRAWN AEROBIC AND ANAEROBIC 10CC AER 5CC ANA   Culture  Setup Time     Final   Value: 01/06/2014 19:00     Performed at [facility]   Culture     Final   Value: NO GROWTH 5 DAYS     Performed at [facility]   Report Status 01/13/2014 FINAL   Final  CULTURE, BLOOD (ROUTINE X 2)     Status: None   Collection Time    01/08/14  6:56 AM      Result Value Ref Range Status   Specimen Description BLOOD LEFT HAND   Final   Special Requests BOTTLES DRAWN AEROBIC AND ANAEROBIC 10CC   Final   Culture  Setup Time     Final   Value: 01/08/2014 10:12     Performed at [facility]   Culture     Final   Value: NO GROWTH 5 DAYS     Performed at [facility]   Report Status 01/14/2014 FINAL   Final  CULTURE, BLOOD (ROUTINE X 2)     Status: None   Collection Time    01/08/14  7:05 AM      Result Value Ref Range Status   Specimen Description BLOOD BLOOD LEFT FOREARM   Final   Special Requests BOTTLES DRAWN AEROBIC ONLY [facility]   Final   Culture  Setup Time     Final   Value: 01/08/2014 10:12     Performed at [facility]   Culture     Final   Value: NO GROWTH 5 DAYS     Performed at [facility]   Report Status 01/14/2014 FINAL   Final  CULTURE, BLOOD (ROUTINE X 2)  Status: None   Collection Time    01/10/14 12:47 AM      Result Value Ref Range Status   Specimen Description BLOOD LEFT FOREARM   Final   Special Requests     Final   Value: BOTTLES DRAWN AEROBIC AND ANAEROBIC BLUE 10 CC RED 5 CC   Culture  Setup Time     Final   Value: 01/11/2014 13:10     Performed at [facility]   Culture     Final   Value: NO GROWTH 5 DAYS      Performed at [facility]   Report Status 01/17/2014 FINAL   Final  CULTURE, BLOOD (ROUTINE X 2)     Status: None   Collection Time    01/10/14 12:53 AM      Result Value Ref Range Status   Specimen Description BLOOD LEFT HAND   Final   Special Requests BOTTLES DRAWN AEROBIC AND ANAEROBIC 10 CC   Final   Culture  Setup Time     Final   Value: 01/11/2014 13:10     Performed at [facility]   Culture     Final   Value: NO GROWTH 5 DAYS     Performed at [facility]   Report Status 01/17/2014 FINAL   Final  MRSA PCR SCREENING     Status: None   Collection Time    01/20/14  5:28 PM      Result Value Ref Range Status   MRSA by PCR NEGATIVE  NEGATIVE Final   Comment:            The GeneXpert MRSA Assay (FDA     approved for NASAL specimens     only), is one component of a     comprehensive MRSA colonization     surveillance program. It is not     intended to diagnose MRSA     infection nor to guide or     monitor treatment for     MRSA infections.     Medications:  Scheduled:  . abacavir  600 mg Oral Daily  . acyclovir  800 mg Oral Daily  . aspirin EC  81 mg Oral Daily  .  ceFAZolin (ANCEF) IV  2 g Intravenous Q12H  . cloNIDine  0.3 mg Transdermal Weekly  . Darunavir Ethanolate  800 mg Oral Q breakfast  . hydrALAZINE  50 mg Oral 3 times per day  . hydrocerin  1 application Topical BID  . insulin aspart  0-15 Units Subcutaneous TID WC  . insulin glargine  30 Units Subcutaneous QHS  . labetalol  600 mg Oral BID  . lacosamide  150 mg Oral BID  . lactulose  20 g Oral TID  . lamiVUDine  100 mg Oral Daily  . levETIRAcetam  1,500 mg Oral BID  . levofloxacin  500 mg Oral Q48H  . pantoprazole  40 mg Oral Daily  . ritonavir  100 mg Oral Q breakfast  . sodium bicarbonate  650 mg Oral BID   Assessment: 53yo female with MSSA bacteremia and HCAP/aspiration PNA.   Pharmacy consulted to switch vanc/zosyn to ancef for MSSA bacteremia and 3 days of PO levaquin  to complete abx course for HCAP.  Afebrile, WBC wnl, creat 2.56, creat cl ~ 28 ml/min.   Plan: - ancef 2 gm IV q12h for MSSA bacteremia -levaquin 500 mg po q48 hrs x 2 doses to complete 3 more days of therapy to  complete abx course started with vanc/zosyn for HCAP  Herby Abraham, Pharm.D. [phone] 01/21/2014 3:55 PM

## 2014-01-21 NOTE — Progress Notes (Signed)
Subjective: Michelle [name] was energetic and conversant this morning. She had no complaints and states she has had no changes in BM. Denies SOB, had removed her oxygen, and states she was feeling well enough to ambulate on her own. Endorses having an appetite.   Objective: Vital signs in last 24 hours: Filed Vitals:   01/21/14 0838 01/21/14 1006 01/21/14 1026 01/21/14 1209  BP: 203/104  191/93 209/93  Pulse: 56 88  61  Temp: 97.6 F (36.4 C)   97.5 F (36.4 C)  TempSrc: Oral   Oral  Resp: 17   14  Height:      Weight:      SpO2: 100%   100%   Weight change:   Intake/Output Summary (Last 24 hours) at 01/21/14 1540 Last data filed at 01/21/14 1457  Gross per 24 hour  Intake    220 ml  Output    850 ml  Net   -630 ml   Physical Exam General appearance: alert, sitting on side of bed in NAD with [place] off  Eyes: no scleral icterus, EOMi Lungs: CTAB except with mild bilateral rales at bases, no crackles or rhonchi  Heart: regular rate and rhythm, no murmur, rub or gallop  Abdomen: soft, non-tender, mildly distended, and obese  Extremities: trace edema bilaterally to upper thighs Neurologic: AAO x3, thought content appropriate  Lab Results: Basic Metabolic Panel:  Recent Labs Lab 01/20/14 1800 01/21/14 0500  NA 134* 132*  K 4.2 4.3  CL 100 100  CO2 19 17*  GLUCOSE 424* 485*  BUN 38* 47*  CREATININE 2.17* 2.56*  CALCIUM 7.2* 6.7*   Liver Function Tests:  Recent Labs Lab 01/15/14 0500 01/20/14 1800  AST 62* 47*  ALT <5 9  ALKPHOS 669* 585*  BILITOT 3.5* 3.6*  PROT 6.0 6.5  ALBUMIN 1.2* 1.2*   CBC:  Recent Labs Lab 01/20/14 1800 01/21/14 0500  WBC 7.6 8.2  NEUTROABS  --  6.8  HGB 9.2* 9.2*  HCT 28.2* 28.5*  MCV 95.9 100.4*  PLT 205 192   CBG:  Recent Labs Lab 01/15/14 0752 01/15/14 1157 01/15/14 1721 01/20/14 2142 01/21/14 0941 01/21/14 1209  GLUCAP 278* 305* 312* 459* 447* 392*   Coagulation:  Recent Labs Lab 01/21/14 0900  LABPROT 32.3*    INR 3.14*   Urine Drug Screen: Drugs of Abuse     Component Value Date/Time   LABOPIA NONE DETECTED 01/03/2014 2311   LABOPIA NEG 11/13/2013 1516   COCAINSCRNUR NONE DETECTED 01/03/2014 2311   COCAINSCRNUR NEG 11/13/2013 1516   LABBENZ NONE DETECTED 01/03/2014 2311   LABBENZ NEG 11/13/2013 1516   AMPHETMU NONE DETECTED 01/03/2014 2311   THCU NONE DETECTED 01/03/2014 2311   LABBARB NONE DETECTED 01/03/2014 2311   LABBARB NEG 11/13/2013 1516    Micro Results: Recent Results (from the past 240 hour(s))  MRSA PCR SCREENING     Status: None   Collection Time    01/20/14  5:28 PM      Result Value Ref Range Status   MRSA by PCR NEGATIVE  NEGATIVE Final   Comment:            The GeneXpert MRSA Assay (FDA     approved for NASAL specimens     only), is one component of a     comprehensive MRSA colonization     surveillance program. It is not     intended to diagnose MRSA     infection nor to guide or  monitor treatment for     MRSA infections.   Studies/Results: No results found.  Medications:  Scheduled Meds: . abacavir  600 mg Oral Daily  . acyclovir  800 mg Oral Daily  . aspirin EC  81 mg Oral Daily  .  ceFAZolin (ANCEF) IV  2 g Intravenous Q12H  . cloNIDine  0.3 mg Transdermal Weekly  . Darunavir Ethanolate  800 mg Oral Q breakfast  . hydrALAZINE  50 mg Oral 3 times per day  . hydrocerin  1 application Topical BID  . insulin aspart  0-15 Units Subcutaneous TID WC  . insulin glargine  30 Units Subcutaneous QHS  . labetalol  600 mg Oral BID  . lacosamide  150 mg Oral BID  . lactulose  20 g Oral TID  . lamiVUDine  100 mg Oral Daily  . levETIRAcetam  1,500 mg Oral BID  . pantoprazole  40 mg Oral Daily  . ritonavir  100 mg Oral Q breakfast  . 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  SOB, likely HCAP: HCAP risk factors (hospitalized for 2 or more days within the past 90 days (discharged on 8/21), resident of a nursing home, recipient of recent intravenous antibiotic therapy). OSH chest x-Shank showed patchy atelectasis or infiltrates in the left midlung and right lung base. Her O2 saturation has been around 100% on room air.  However, she has been afebrile and has a normal WBC count. -Was continued on IV Vanc and Zosyn today, but talked with ID and they recommend just continuing IV Ancef (which she was on for her bacteremia) and +/- 3 days on Levaquin PO, as bacteria pneumonia picture looking less likely due to her improved clinical picture, o2 sats, and lack of SOB.  Will continue her on IV Ancef and 3 days levoquin.  -Continue pulse ox and oxygen as required (>92% O2 sats)  -Discontinued telemetry   MSSA Bacteremia: She is currently on antibiotic day 15. Was on IV Vanc and Zosyn due to concerns of HCAP, but will switch her back to IV Cefazolin, which should be completed on Sept 7th. . Is afebrile. She had 2/2 blood cultures on 8/10 that grew MSSA. The source of her bacteremia was likely her port-a-cath placed on 12/05/13, and this was removed on 8/11. A right IJ PICC was placed on 8/18.  -Will continue IV cefazolin for 4 weeks total   HTN: Uncontrolled, remains >190s. Current meds include labetalol  BID and will put her on clonidine 0.3 patch from oral meds to increase compliance in outpatient setting.  She also has had ANA positive with titers that increased from 1:40 to 1:80 after the use of hydralazine, but due to uncontrolled HTN will restart hydalazine  TID due to persistent HTN. May start ACEi if she remains hypertensive and renal function allows. -labetalol  BID  -clonidine patch -Hydralazine  TID   -Holding lisinopril  daily due to renal function  Acute on Chronic Anemia: Asymptomatic. Her baseline Hg is between 6-8. Her Hg today in 9.2 s/p transfusion at OSH. Will give the patient Aranesp 265mcg-300mcg every 2-3 weeks. Her anemia is likely due to multifactorial causes including bone marrow  suppression due to ART therapy and ACD due to her CKD4. Drug induced anemia by hydralazine has also been proposed as a contributing factor since her ANA titer increased with the use of this medication.  -Will transfuse if HgB<6 or if she becomes symptomatic  -Aranesp on Sept 1   Chronic Renal Failure: Recent baseline Cr in the 2.2-2.7 range. Cr today is 2.56. UOP has been good/adequate. Monitor UOP.  -Will continue to hold ACEi    DMII: Last Hg A1C 7.5% on 11/13/13. On Lantus 30 u qHS and Novolog 10-25 u actid at home.  CBGs have been 200-400s. Was on Lantus 20 yesterday and will increase to home dose today. Was given 10 novolog this AM due to CGB at 447.  -Increased to Lantus 30 units qHS  -Mod SSI   End Stage Liver Disease: MELD score of 17. Secondary to Hepatitis C infection. She has no signs or symptoms of hepatic encephalopathy or SBP. ALP is trending down.  -Will consider diagnostic/therapeutic tap of ascitic fluid if she declines  -Continue home lactulose PO PRN titrated to 3BM/day   History of seizure: Stable. Has a history of pseudoseizures with voluntary jerking movements while following commands and remaining fully conversant. Will monitor and continue home seizure medications.  -Continue Vimpat  -Continue Keppra   HIV disease: Well controlled. Her viral load was rechecked and was undetectable on 01/14/14. Her CD4 count was also checked and had a count of 240. It was at 280 in April 2015.  -Continue home abacavir, prezista, norvir, and epivir  -Continue acyclovir for prophylaxis   Hepatitis C: Genotype in December 2014. Genotype 1b, viral load of 4276200, Log of 6.63.  -She  will f/u with Dr. Drue Second in ID for possible treatment of Hep C   DVT prophylaxis: SCDs   Diet:  -Diet heart healthy/ Carb mod  Dispo: May be ready for discharge in 1-2 days.  This is a Psychologist, occupational Note.  The care of the patient was discussed with Dr. Virgina Organ and the assessment and plan formulated with their assistance.  Please see their attached note for official documentation of the daily encounter.   LOS: 1 day   Lillia Carmel, Med Student 01/21/2014, 3:40 PM

## 2014-01-21 NOTE — Progress Notes (Signed)
Reported to Dr. Rogelia Boga that pt's blood pressure had been elevated since she had came to hospital systolic in low 200's. Pt was asymptomatic. MD made changes to pt's blood pressure medications. Pt has been asymptomatic. Will monitor.

## 2014-01-21 NOTE — Progress Notes (Signed)
Inpatient Diabetes Program Recommendations  AACE/ADA: New Consensus Statement on Inpatient Glycemic Control (2013)  Target Ranges:  Prepandial:   less than 140 mg/dL      Peak postprandial:   less than 180 mg/dL (1-2 hours)      Critically ill patients:  140 - 180 mg/dL   Reason for Assessment:  Results for Michelle [name], Michelle [name] (MRN [id number]) as of 01/21/2014 12:11  Ref. Range 01/20/2014 21:42 01/21/2014 09:41  Glucose-Capillary Latest Range: 70-99 mg/dL 474 (H) 259 (H)   Diabetes history: Type 2 Diabetes Outpatient Diabetes medications: According to H&P patient takes Lantus 30 units daily and Novolog 10-25 units tid with meals Current orders for Inpatient glycemic control: Lantus 30 units daily, Novolog moderate tid with meals.  Please add Novolog meal coverage 6 units tid with meals (Hold if patient eats less than 50%). Will need to monitor closely.  If CBG's continue to be elevated, may need insulin drip??  Thanks,  Beryl Meager, RN, BC-ADM Inpatient Diabetes Coordinator Pager [phone]

## 2014-01-21 NOTE — Progress Notes (Signed)
  Date: 01/21/2014  Patient name: JEANNE HARSHMAN  Medical record number: [id number]  Date of birth: December 06, 1960   This patient has been seen and the plan of care was discussed with the house staff. Please see their note for complete details. I concur with their findings with the following additions/corrections: Ms Bolds was able to talk non stop without any dyspnea and her O2 remained 100% on RA.   1. Acute resp failure - resolved. She may have HAP so will complete three days of Levaquin (has been on Vanc and Zosyn). She could have also had vol overload from sig elevated BP's and she appears to be euvolemic now.   2. HTN - Change PO clonidine to patch to improve compliance. Cont with Labetolol and might change to once a day metoprolol once other changes have been made. Add Hydalazine - not great bc of concern over ANA titers and TID dosing but risk of HTN greater than risk of using Hydral. ACE /ARB CI 2/2 renal fxn. Diuretics limited 2/2 renal fxn and had been held last admit. CCB CI 2/2 bradycardia and allergic rxn.   3. MSSA bacteremia - to complete her course of Ancef.  Possible D/C in AM.   Burns Spain, MD 01/21/2014, 4:24 PM

## 2014-01-22 ENCOUNTER — Encounter (HOSPITAL_COMMUNITY): Payer: Self-pay | Admitting: General Practice

## 2014-01-22 LAB — BASIC METABOLIC PANEL
Anion gap: 16 — ABNORMAL HIGH (ref 5–15)
BUN: 44 mg/dL — AB (ref 6–23)
CO2: 18 meq/L — AB (ref 19–32)
CREATININE: 2.44 mg/dL — AB (ref 0.50–1.10)
Calcium: 6.8 mg/dL — ABNORMAL LOW (ref 8.4–10.5)
Chloride: 105 mEq/L (ref 96–112)
GFR calc Af Amer: 25 mL/min — ABNORMAL LOW (ref >90)
GFR calc non Af Amer: 21 mL/min — ABNORMAL LOW (ref >90)
Glucose, Bld: 136 mg/dL — ABNORMAL HIGH (ref 70–99)
Potassium: 3.8 mEq/L (ref 3.7–5.3)
Sodium: 139 mEq/L (ref 137–147)

## 2014-01-22 LAB — GLUCOSE, CAPILLARY
GLUCOSE-CAPILLARY: 180 mg/dL — AB (ref 70–99)
Glucose-Capillary: 144 mg/dL — ABNORMAL HIGH (ref 70–99)
Glucose-Capillary: 118 mg/dL — ABNORMAL HIGH (ref 70–99)
Glucose-Capillary: 157 mg/dL — ABNORMAL HIGH (ref 70–99)
Glucose-Capillary: 112 mg/dL — ABNORMAL HIGH (ref 70–99)

## 2014-01-22 MED ORDER — SODIUM CHLORIDE 0.9 % IJ SOLN
10.0000 mL | INTRAMUSCULAR | Status: DC | PRN
  Administered 2014-01-22 – 2014-01-24 (×3): 10 mL

## 2014-01-22 MED ORDER — ACETAMINOPHEN 325 MG PO TABS
650.0000 mg | ORAL_TABLET | Freq: Four times a day (QID) | ORAL | Status: DC | PRN
  Administered 2014-01-22: 650 mg via ORAL

## 2014-01-22 MED ORDER — HYDRALAZINE HCL 50 MG PO TABS
75.0000 mg | ORAL_TABLET | Freq: Three times a day (TID) | ORAL | Status: DC
  Administered 2014-01-22 – 2014-01-24 (×5): 75 mg via ORAL
  Filled 2014-01-22 (×8): qty 1

## 2014-01-22 MED ORDER — ACETAMINOPHEN 325 MG PO TABS
650.0000 mg | ORAL_TABLET | Freq: Four times a day (QID) | ORAL | Status: DC | PRN
  Filled 2014-01-22: qty 2

## 2014-01-22 MED ORDER — ACETAMINOPHEN 325 MG PO TABS
650.0000 mg | ORAL_TABLET | Freq: Once | ORAL | Status: AC
  Administered 2014-01-22: 650 mg via ORAL
  Filled 2014-01-22: qty 2

## 2014-01-22 NOTE — Clinical Social Work Note (Signed)
Attempted to speak with patient about possible SNF placement but she reported as I walked in and introduced self; "I am in the process of trying to prevent a seizure"- CSW left room and advised RN who was aware-  CSW will attempt to speak with patient again tomorrow to discuss SNF-  Reece Levy, MSW, [employer] [phone]

## 2014-01-22 NOTE — Progress Notes (Signed)
Pt placed on contact isolation after call from IP. Even though most recent MRSA PCR swab was negative, there was a positive result within the last 30 days. Per policy, pt must be placed on contact isolation.

## 2014-01-22 NOTE — Progress Notes (Signed)
Inpatient Diabetes Program Recommendations  AACE/ADA: New Consensus Statement on Inpatient Glycemic Control (2013)  Target Ranges:  Prepandial:   less than 140 mg/dL      Peak postprandial:   less than 180 mg/dL (1-2 hours)      Critically ill patients:  140 - 180 mg/dL   Reason for Assessment:  CBG's improved this AM.  Please consider adding Novolog meal coverage 6 units tid with meals (to be held if patient eats less than 50%).  Thanks,  Beryl Meager, RN, BC-ADM Inpatient Diabetes Coordinator Pager [phone]

## 2014-01-22 NOTE — Progress Notes (Signed)
Subjective:  No overnight events. She denied SOB, chest pain, or diarrhea with good appetite. Remains afebrile, ready to go to another facility.   Objective:  Vital signs in last 24 hours:  Filed Vitals:    01/22/14 0550  01/22/14 0647  01/22/14 1100  01/22/14 1434   BP:  212/106  209/92  200/93  185/105   Pulse:  70   80  105   Temp:  97.8 F (36.6 C)   97.8 F (36.6 C)  98.5 F (36.9 C)   TempSrc:  Oral    Oral   Resp:  20   18  18    Height:       Weight:       SpO2:  98%   100%  97%    Weight change:   Intake/Output Summary (Last 24 hours) at 01/22/14 2054 Last data filed at 01/22/14 0930   Gross per 24 hour   Intake  290 ml   Output  0 ml   Net  290 ml   Vitals reviewed  General appearance: alert, sitting on chair in NAD  Eyes: scleral icterus present  Lungs: clear to auscultation bilaterally with no wheezes or rhonchi  Heart: regular rate and rhythm, no murmur, rub or gallop  Abdomen: soft, non-tender, mildly distended, and obese  Extremities: trace LE edema bilaterally  Neurologic: AAO x3, following commands appropriately  Lab Results:  Reviewed and documented in Electronic Record  Micro Results:  Reviewed and documented in Electronic Record  Studies/Results:  Reviewed and documented in Electronic Record   Medications: I have reviewed the patient's current medications.  Scheduled Meds:  .  abacavir  600 mg  Oral  Daily   .  acyclovir  800 mg  Oral  Daily   .  aspirin EC  81 mg  Oral  Daily   .  ceFAZolin (ANCEF) IV  2 g  Intravenous  Q12H   .  cloNIDine  0.3 mg  Transdermal  Weekly   .  Darunavir Ethanolate  800 mg  Oral  Q breakfast   .  hydrALAZINE  75 mg  Oral  3 times per day   .  hydrocerin  1 application  Topical  BID   .  insulin aspart  0-15 Units  Subcutaneous  TID WC   .  insulin glargine  30 Units  Subcutaneous  QHS   .  labetalol  600 mg  Oral  BID   .  lacosamide  150 mg  Oral  BID   .  lactulose  20 g  Oral  TID   .  lamiVUDine  100 mg   Oral  Daily   .  levETIRAcetam  1,500 mg  Oral  BID   .  levofloxacin  500 mg  Oral  Q48H   .  pantoprazole  40 mg  Oral  Daily   .  ritonavir  100 mg  Oral  Q breakfast   .  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   HCAP: She had CXR at OSH with patchy infiltrates in the left midlung and right lung base c/w with PNA  and concerning for HCAP given her multiple hospitalizations in the past 30 days. She remains afebrile here with no oxygen supplementation requirement.  -Continuing IV Ancef (which she was on for her bacteremia) and + Levaquin PO (day 2/3)   MSSA Bacteremia: Diagnosed on previous hospitalization by 2/2 BC. Antibiotic day 16. Was on IV Vanc and Zosyn due to concerns of HCAP, but now on IV Cefazolin.  -Will continue IV cefazolin for 4 weeks total, to end on Sept 7th  -Return to SNF for remainder of Abx treatment, will need ID follow up and RIJ line removed   HTN: Uncontrolled, remains >190s. Home meds include labetalol  BID clonidine 0.3 TID, and hydralazine  TID. She has hx of hives with amlodipine  -labetalol  BID  -clonidine 0.3mg  patch (v tablet TID to increase compliance)  -increase Hydralazine to  TID from  TID  -Holding lisinopril  daily due to renal function, may resume as it remains stable  Acute on Chronic Anemia: Asymptomatic. Her baseline Hg is between 6-8. Hgb here stable at 9.2 s/p transfusion at OSH. 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 Drug induced anemia by hydralazine has also been proposed as  a contributing factor since her ANA titer increased with the use of this medication.  -Will transfuse if HgB<6 or if she becomes symptomatic  -Aranesp on Sept 1 --will need Aranesp 277mcg-300mcg every 2-3 weeks   Chronic Renal Failure: Stable, at baseline. Baseline Cr in the 2.2-2.7 range.  -May resume ACEi if remains stable  DMII: Last Hg A1C 7.5% on 11/13/13. On Lantus 30 u qHS and Novolog 10-25 u ac tid at home. CBGs have been <200 since increasing lantus to 30u.  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 Secondary to Hepatitis C infection. She has no signs or symptoms of hepatic encephalopathy or SBP. ALP is trending down.  -Will consider diagnostic/therapeutic tap of ascitic fluid if she declines  -Continue home lactulose PO titrated to 3BM/day   History of seizure: Stable. Has a history of pseudoseizures with voluntary jerking movements while following commands and remaining fully conversant. Will monitor and continue home seizure medications.  -Continue Vimpat  -Continue Keppra   HIV disease: Well controlled. Her viral load was rechecked and was undetectable on 01/14/14. Her CD4 count was also checked and had a count of 240. It was at 280 in April 2015.  -Continue home abacavir, prezista, norvir, and epivir  -Continue acyclovir for prophylaxis   Hepatitis C: Genotype in December 2014. Genotype 1b, viral load of 4276200, Log of 6.63.  -She will f/u with Dr. Drue Second in ID for possible treatment of Hep C   DVT prophylaxis: SCDs   Diet:  -Diet heart healthy/ Carb mod   Dispo: Disposition is deferred at this time, awaiting placement at SNF for continued IV antibiotic treatment. Anticipated discharge in approximately 1-2 day(s).   The patient does have a current PCP Christen Bame, MD) and does need an [facility] hospital follow-up appointment after discharge.   The patient does have transportation limitations that hinder transportation to  clinic appointments. .Services Needed at time of discharge: Y = Yes, Blank = No PT:   OT:   RN:   Equipment:   Other:     LOS: 2 days   Ky Barban, MD 01/22/2014, 9:09 PM

## 2014-01-22 NOTE — Progress Notes (Signed)
Subjective: Michelle [name] had no acute events overnight. She denies SOB, is ambulating appropriately, has regular BMs, and reports having a good appetite. She did complain of neck pain this afternoon, which resolved with tylenol.   Objective: Vital signs in last 24 hours: Filed Vitals:   01/21/14 2140 01/22/14 0550 01/22/14 0647 01/22/14 1100  BP: 186/84 212/106 209/92 200/93  Pulse: 66 70  80  Temp: 97.5 F (36.4 C) 97.8 F (36.6 C)  97.8 F (36.6 C)  TempSrc: Oral Oral    Resp: Height:      Weight:      SpO2: 100% 98%  100%   Weight change:   Intake/Output Summary (Last 24 hours) at 01/22/14 1428 Last data filed at 01/22/14 0930  Gross per 24 hour  Intake    340 ml  Output    100 ml  Net    240 ml   Physical Exam  General appearance: alert, sitting on chair in NAD Eyes: scleral icterus present, EOMi  Lungs: CTAB, no crackles or rhonchi  Heart: regular rate and rhythm, no murmur, rub or gallop  Abdomen: soft, non-tender, mildly distended, and obese  Extremities: trace LE edema bilaterally Neurologic: AAO x3  Lab Results: Basic Metabolic Panel:  Recent Labs Lab 01/21/14 0500 01/22/14 0815  NA 132* 139  K 4.3 3.8  CL 100 105  CO2 17* 18*  GLUCOSE 485* 136*  BUN 47* 44*  CREATININE 2.56* 2.44*  CALCIUM 6.7* 6.8*   Liver Function Tests:  Recent Labs Lab 01/20/14 1800  AST 47*  ALT 9  ALKPHOS 585*  BILITOT 3.6*  PROT 6.5  ALBUMIN 1.2*  CBC:  Recent Labs Lab 01/20/14 1800 01/21/14 0500  WBC 7.6 8.2  NEUTROABS  --  6.8  HGB 9.2* 9.2*  HCT 28.2* 28.5*  MCV 95.9 100.4*  PLT 205 192   CBG:  Recent Labs Lab 01/21/14 1209 01/21/14 1555 01/21/14 2138 01/21/14 2309 01/22/14 0751 01/22/14 1130  GLUCAP 392* 305* 129* 144* 118* 157*   Coagulation:  Recent Labs Lab 01/21/14 0900  LABPROT 32.3*  INR 3.14*   Micro Results: Recent Results (from the past 240 hour(s))  MRSA PCR SCREENING     Status: None   Collection Time   01/20/14  5:28 PM      Result Value Ref Range Status   MRSA by PCR NEGATIVE  NEGATIVE Final   Comment:            The GeneXpert MRSA Assay (FDA     approved for NASAL specimens     only), is one component of a     comprehensive MRSA colonization     surveillance program. It is not     intended to diagnose MRSA     infection nor to guide or     monitor treatment for     MRSA infections.   Medications:  Scheduled Meds: . abacavir  600 mg Oral Daily  . acyclovir  800 mg Oral Daily  . aspirin EC  81 mg Oral Daily  .  ceFAZolin (ANCEF) IV  2 g Intravenous Q12H  . cloNIDine  0.3 mg Transdermal Weekly  . Darunavir Ethanolate  800 mg Oral Q breakfast  . hydrALAZINE  50 mg Oral 3 times per day  . hydrocerin  1 application Topical BID  . insulin aspart  0-15 Units Subcutaneous TID WC  . insulin glargine  30 Units Subcutaneous QHS  . labetalol  600  mg Oral BID  . lacosamide  150 mg Oral BID  . lactulose  20 g Oral TID  . lamiVUDine  100 mg Oral Daily  . levETIRAcetam  1,500 mg Oral BID  . levofloxacin  500 mg Oral Q48H  . pantoprazole  40 mg Oral Daily  . ritonavir  100 mg Oral Q breakfast  . 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   HCAP: HCAP risk factors (hospitalized for 2 or more days within the past 90 days (discharged on 8/21), resident of a nursing home, recipient of recent intravenous antibiotic therapy). OSH chest x-Koerner showed patchy atelectasis or infiltrates in the left midlung and right lung base. Her O2 sats have been between 98-100% on room air. She has been afebrile and has a normal WBC count.  -Continuing  IV Ancef (which she was on for her bacteremia) and + 3 days total on Levaquin PO (has had 2 days already)  MSSA Bacteremia: Afebrile. She is currently on antibiotic day 16. Was on IV Vanc and Zosyn due to concerns of HCAP, but now on IV Cefazolin, which should be completed on Sept 7th. She had 2/2 blood cultures on 8/10 that grew MSSA. The source of her bacteremia was likely her port-a-cath placed on 12/05/13, and this was removed on 8/11. A right IJ PICC was placed on 8/18.  -Will continue IV cefazolin for 4 weeks total   HTN: Uncontrolled, remains >190s. Current meds include labetalol  BID and will put her on clonidine 0.3 patch from oral meds to increase compliance in outpatient setting. Due to uncontrolled HTN will increase hydalazine to  TID. May start ACEi if she remains hypertensive and renal function allows.  -labetalol  BID  -clonidine patch  -Hydralazine  TID  -Holding lisinopril  daily due to renal function   Acute on Chronic Anemia: Asymptomatic. Her baseline Hg is between 6-8. Her Hg today in 9.2 s/p transfusion at OSH. Will give the patient Aranesp 231mcg-300mcg every 2-3 weeks. 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 Drug induced anemia by hydralazine has also been proposed as a contributing factor since her ANA titer increased with the use of this medication.  -Will transfuse if HgB<6 or if she becomes symptomatic  -Aranesp on Sept 1   Chronic Renal Failure: Recent baseline Cr in the 2.2-2.7 range. Cr today is 2.56. UOP has been good/adequate. Monitor UOP.  -Will continue to hold ACEi   DMII: Last Hg A1C 7.5% on 11/13/13. On Lantus 30 u qHS and Novolog 10-25 u actid at home. CBGs have been <200 since increasing lantus to 30u. Since she has ranged between 118-157 over the past day, will not add additional novolog with meals due to concerns of making her hypoglycemic since  she is usually around 200.  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 Secondary to Hepatitis C infection. She has no signs or symptoms of hepatic encephalopathy or SBP. ALP is trending down.  -Will consider diagnostic/therapeutic tap of ascitic fluid if she declines  -  Continue home lactulose PO titrated to 3BM/day   History of seizure: Stable. Has a history of pseudoseizures with voluntary jerking movements while following commands and remaining fully conversant. Will monitor and continue home seizure medications.  -Continue Vimpat  -Continue Keppra   HIV disease: Well controlled. Her viral load was rechecked and was undetectable on 01/14/14. Her CD4 count was also checked and had a count of 240. It was at 280 in April 2015.  -Continue home abacavir, prezista, norvir, and epivir  -Continue acyclovir for prophylaxis   Hepatitis C: Genotype in December 2014. Genotype 1b, viral load of 4276200, Log of 6.63.  -She will f/u with Dr. Drue Second in ID for possible treatment of Hep C   DVT prophylaxis: SCDs  Diet:  -Diet heart healthy/ Carb mod   Dispo: Discharge pending SNF placement.   This is a Psychologist, occupational Note.  The care of the patient was discussed with Dr. Garald Braver and the assessment and plan formulated with their assistance.  Please see their attached note for official documentation of the daily encounter.   LOS: 2 days   Lillia Carmel, Med Student 01/22/2014, 2:28 PM

## 2014-01-22 NOTE — Progress Notes (Signed)
I have seen the patient and reviewed the daily progress note by Lillia Carmel MS 4 and discussed the care of the patient with them.  See below for documentation of my findings, assessment, and plans.  Subjective: No overnight events. She denied SOB, chest pain, or diarrhea with good appetite. Remains afebrile, ready to go to another facility.  Objective: Vital signs in last 24 hours: Filed Vitals:   01/22/14 0550 01/22/14 0647 01/22/14 1100 01/22/14 1434  BP: 212/106 209/92 200/93 185/105  Pulse: 70  80 105  Temp: 97.8 F (36.6 C)  97.8 F (36.6 C) 98.5 F (36.9 C)  TempSrc: Oral   Oral  Resp: Height:      Weight:      SpO2: 98%  100% 97%   Weight change:   Intake/Output Summary (Last 24 hours) at 01/22/14 2054 Last data filed at 01/22/14 0930  Gross per 24 hour  Intake    290 ml  Output      0 ml  Net    290 ml  Vitals reviewed General appearance: alert, sitting on chair in NAD  Eyes: scleral icterus present Lungs: clear to auscultation bilaterally with no wheezes or rhonchi  Heart: regular rate and rhythm, no murmur, rub or gallop  Abdomen: soft, non-tender, mildly distended, and obese  Extremities: trace LE edema bilaterally  Neurologic: AAO x3, following commands appropriately  Lab Results: Reviewed and documented in Electronic Record Micro Results: Reviewed and documented in Electronic Record Studies/Results: Reviewed and documented in Electronic Record Medications: I have reviewed the patient's current medications. Scheduled Meds: . abacavir  600 mg Oral Daily  . acyclovir  800 mg Oral Daily  . aspirin EC  81 mg Oral Daily  .  ceFAZolin (ANCEF) IV  2 g Intravenous Q12H  . cloNIDine  0.3 mg Transdermal Weekly  . Darunavir Ethanolate  800 mg Oral Q breakfast  . hydrALAZINE  75 mg Oral 3 times per day  . hydrocerin  1 application Topical BID  . insulin aspart  0-15 Units Subcutaneous TID WC  . insulin glargine  30 Units Subcutaneous QHS  .  labetalol  600 mg Oral BID  . lacosamide  150 mg Oral BID  . lactulose  20 g Oral TID  . lamiVUDine  100 mg Oral Daily  . levETIRAcetam  1,500 mg Oral BID  . levofloxacin  500 mg Oral Q48H  . pantoprazole  40 mg Oral Daily  . ritonavir  100 mg Oral Q breakfast  . 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   HCAP: She had CXR at OSH with patchy infiltrates in the left midlung and right lung base c/w with PNA and concerning for HCAP given her multiple hospitalizations in the past 30 days. She remains afebrile here with no oxygen supplementation requirement.  -Continuing IV Ancef (which she was on for her bacteremia) and + Levaquin PO (day 2/3)  MSSA Bacteremia: Diagnosed on previous hospitalization by 2/2 BC. Antibiotic day 16. Was on IV Vanc and Zosyn due to concerns of HCAP, but now on IV Cefazolin. -Will continue IV cefazolin for 4 weeks total, to end on Sept 7th -Return  to SNF for remainder of Abx treatment, will need ID follow up and RIJ line removed   HTN: Uncontrolled, remains >190s. Home meds include labetalol 600mg  BID clonidine 0.3 TID, and hydralazine 50mg  TID. She has hx of hives with amlodipine  -labetalol 600mg  BID  -clonidine 0.3mg  patch (v tablet TID to increase compliance) -increase Hydralazine to 75mg  TID from 50mg  TID  -Holding lisinopril 40mg  daily due to renal function   Acute on Chronic Anemia: Asymptomatic. Her baseline Hg is between 6-8. Hgb here stable at 9.2 s/p transfusion at OSH. 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 Drug  induced anemia by hydralazine has also been proposed as a contributing factor since her ANA titer increased with the use of this medication.  -Will transfuse if HgB<6 or if she becomes symptomatic  -Aranesp on Sept 1 --will need  Aranesp 249mcg-300mcg every 2-3 weeks  Chronic Renal Failure: Stable, at baseline. Baseline Cr in the 2.2-2.7 range.  -Will continue to hold ACEi   DMII: Last Hg A1C 7.5% on 11/13/13. On Lantus 30 u qHS and Novolog 10-25 u ac tid at home. CBGs have been <200 since increasing lantus to 30u.  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 Secondary to Hepatitis C infection. She has no signs or symptoms of hepatic encephalopathy or SBP. ALP is trending down.  -Will consider diagnostic/therapeutic tap of ascitic fluid if she declines  -Continue home lactulose PO titrated to 3BM/day   History of seizure: Stable. Has a history of pseudoseizures with voluntary jerking movements while following commands and remaining fully conversant. Will monitor and continue home seizure medications.  -Continue Vimpat  -Continue Keppra   HIV disease: Well controlled. Her viral load was rechecked and was undetectable on 01/14/14. Her CD4 count was also checked and had a count of 240. It was at 280 in April 2015.  -Continue home abacavir, prezista, norvir, and epivir  -Continue acyclovir for prophylaxis   Hepatitis C: Genotype in December 2014. Genotype 1b, viral load of 4276200, Log of 6.63.  -She will f/u with Dr. Drue Second in ID for possible treatment of Hep C   DVT prophylaxis: SCDs   Diet:  -Diet heart healthy/ Carb mod   Dispo: Disposition is deferred at this time, awaiting placement at SNF for continued IV antibiotic treatment.  Anticipated discharge in approximately 1-2 day(s).   The patient does have a current PCP Christen Bame, MD) and does need an [facility] hospital follow-up appointment after discharge.  The patient does have  transportation limitations that hinder transportation to clinic appointments.  .Services Needed at time of discharge: Y = Yes, Blank = No PT:   OT:   RN:   Equipment:   Other:     LOS: 2 days   Ky Barban, MD 01/22/2014, 8:54 PM

## 2014-01-22 NOTE — Progress Notes (Signed)
MD notified of BP 200/93. No new orders received from MD. Last MD progress note indicates renal function as rationale for holding some BP medications at this time.

## 2014-01-23 LAB — GLUCOSE, CAPILLARY
GLUCOSE-CAPILLARY: 223 mg/dL — AB (ref 70–99)
GLUCOSE-CAPILLARY: 235 mg/dL — AB (ref 70–99)
GLUCOSE-CAPILLARY: 193 mg/dL — AB (ref 70–99)
Glucose-Capillary: 204 mg/dL — ABNORMAL HIGH (ref 70–99)

## 2014-01-23 NOTE — Progress Notes (Signed)
While administering medications, noted pt was having difficulty with finishing thoughts. Oriented to self only. MD notified, who came into the room to round on the pt.

## 2014-01-23 NOTE — Clinical Social Work Note (Signed)
Spoke with patient about SNF return- she is refusing to return to [facility]- states she wants to go home with family- she appears confused to me and I question her capacity- discussed with RNCM who is speaking with her and family-  Reece Levy, MSW, [place] [phone]

## 2014-01-23 NOTE — Progress Notes (Signed)
Subjective: Michelle [name] had no acute events overnight. She denies chest pain, shortness of breath, and has remained afebrile. She endorses having a good appetite and is ambulating appropriately.  Objective: Vital signs in last 24 hours: Filed Vitals:   01/23/14 0005 01/23/14 0552 01/23/14 0641 01/23/14 1036  BP: 156/81 183/79 176/82 173/91  Pulse:  88 82 91  Temp:  98.4 F (36.9 C)    TempSrc:  Oral    Resp:  20    Height:      Weight:      SpO2:  100%     Weight change:   Intake/Output Summary (Last 24 hours) at 01/23/14 1139 Last data filed at 01/23/14 0513  Gross per 24 hour  Intake    430 ml  Output      0 ml  Net    430 ml   Physical Exam  General appearance: alert, sitting on chair in NAD  Eyes: scleral icterus present, EOMi  Lungs: CTAB, no crackles or rhonchi  Heart: regular rate and rhythm, no murmur, rub or gallop  Abdomen: soft, non-tender, mildly distended, and obese  Extremities: trace LE edema bilaterally  Neurologic: AAO x3   Lab Results: Basic Metabolic Panel:  Recent Labs Lab 01/21/14 0500 01/22/14 0815  NA 132* 139  K 4.3 3.8  CL 100 105  CO2 17* 18*  GLUCOSE 485* 136*  BUN 47* 44*  CREATININE 2.56* 2.44*  CALCIUM 6.7* 6.8*  CBG:  Recent Labs Lab 01/21/14 2309 01/22/14 0751 01/22/14 1130 01/22/14 1702 01/22/14 2203 01/23/14 0745  GLUCAP 144* 118* 157* 112* 180* 223*   Micro Results: Recent Results (from the past 240 hour(s))  MRSA PCR SCREENING     Status: None   Collection Time    01/20/14  5:28 PM      Result Value Ref Range Status   MRSA by PCR NEGATIVE  NEGATIVE Final   Comment:            The GeneXpert MRSA Assay (FDA     approved for NASAL specimens     only), is one component of a     comprehensive MRSA colonization     surveillance program. It is not     intended to diagnose MRSA     infection nor to guide or     monitor treatment for     MRSA infections.   Medications:  Scheduled Meds: . abacavir  600 mg Oral  Daily  . acyclovir  800 mg Oral Daily  . aspirin EC  81 mg Oral Daily  .  ceFAZolin (ANCEF) IV  2 g Intravenous Q12H  . cloNIDine  0.3 mg Transdermal Weekly  . Darunavir Ethanolate  800 mg Oral Q breakfast  . hydrALAZINE  75 mg Oral 3 times per day  . hydrocerin  1 application Topical BID  . insulin aspart  0-15 Units Subcutaneous TID WC  . insulin glargine  30 Units Subcutaneous QHS  . labetalol  600 mg Oral BID  . lacosamide  150 mg Oral BID  . lactulose  20 g Oral TID  . lamiVUDine  100 mg Oral Daily  . levETIRAcetam  1,500 mg Oral BID  . levofloxacin  500 mg Oral Q48H  . pantoprazole  40 mg Oral Daily  . ritonavir  100 mg Oral Q breakfast  . 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   HCAP: HCAP risk factors (hospitalized for 2 or more days within the past 90 days (discharged on 8/21), resident of a nursing home, recipient of recent intravenous antibiotic therapy). OSH chest x-[facility] showed patchy atelectasis or infiltrates in the left midlung and right lung base. Her O2 sats have been between 98-100% on room air. She has been afebrile and has a normal WBC count.  -Continuing IV Ancef (which she was on for her bacteremia)  -Has completed 3 day course of Levaquin PO  MSSA Bacteremia: Afebrile. She is currently on antibiotic day 17. Was on IV Vanc and Zosyn due to concerns of HCAP, but now on IV Cefazolin, which should be completed on Sept 7th. She had 2/2 blood cultures on 8/10 that grew MSSA. The source of her bacteremia was likely her port-a-cath placed on 12/05/13, and this was removed on 8/11.  A right IJ PICC was placed on 8/18.  -Will continue IV cefazolin for 4 weeks total   HTN:  Current meds include labetalol  BID and clonidine 0.3 patch to increase compliance in outpatient setting. Also, on hydalazine to  TID. May start ACEi if she remains hypertensive and renal function allows. However, she is chronically >200/90, so will not drop her blood pressure too drastically as it might cause her to be symptomatic.  -labetalol  BID  -clonidine patch  -Hydralazine  TID  -Holding lisinopril    Acute on Chronic Anemia: Asymptomatic. Her baseline Hg is between 6-8. Her Hg today in 9.2 s/p transfusion at OSH. Will give the patient Aranesp 287mcg-300mcg every 2-3 weeks. 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 Drug induced anemia by hydralazine has also been proposed as a contributing factor since her ANA titer increased with the use of this medication.  -Will transfuse if HgB<6 or if she becomes symptomatic  -Aranesp on Sept 1 (she should get Aranesp 200 every 2-3 weeks)  Chronic Renal Failure: Recent baseline Cr in the 2.2-2.7 range. Cr today is 2.44. UOP has been good/adequate. Monitor UOP.  -Will continue to hold ACEi   DMII: Last Hg A1C 7.5% on 11/13/13. On Lantus 30 u qHS and Novolog 10-25 u actid at home. CBGs have been <200 since increasing lantus to 30u. Since she has ranged between 118-157 over the past day, will not add additional novolog with meals due to concerns of making her hypoglycemic since she is usually around 200.  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 Secondary to Hepatitis C infection. She has no signs or symptoms of hepatic encephalopathy or SBP. ALP is trending down.  -Will consider diagnostic/therapeutic tap of ascitic fluid if she declines  -Continue home lactulose PO titrated to 3BM/day   History of  seizure: Stable. Has a history of pseudoseizures with voluntary jerking movements while following commands and remaining fully conversant. Will monitor and continue home seizure medications.  -Continue Vimpat  -Continue Keppra   HIV disease: Well controlled. Her viral load was rechecked and was undetectable on 01/14/14. Her CD4 count was also checked and had a count of 240. It was at 280 in April  2015.  -Continue home abacavir, prezista, norvir, and epivir  -Continue acyclovir for prophylaxis   Hepatitis C: Genotype in December 2014. Genotype 1b, viral load of 4276200, Log of 6.63.  -She will f/u with Dr. Drue Second in ID for possible treatment of Hep C   DVT prophylaxis: SCDs   Diet:  -Diet heart healthy/ Carb mod   Dispo: Discharge pending SNF placement.  This is a Psychologist, occupational Note.  The care of the patient was discussed with Dr. Garald Braver and the assessment and plan formulated with their assistance.  Please see their attached note for official documentation of the daily encounter.   LOS: 3 days   Michelle [name], Med Student 01/23/2014, 11:39 AM

## 2014-01-23 NOTE — Progress Notes (Signed)
Subjective: She was more confused this afternoon with lactulose held yesterday. She had no new complaints. Denies SOB, CP, abdominal pain.    Objective: Vital signs in last 24 hours: Filed Vitals:   01/23/14 0552 01/23/14 0641 01/23/14 1036 01/23/14 1325  BP: 183/79 176/82 173/91 181/93  Pulse: 88 82 91 89  Temp: 98.4 F (36.9 C)   98.6 F (37 C)  TempSrc: Oral   Oral  Resp: 20     Height:      Weight:      SpO2: 100%   100%   Weight change:   Intake/Output Summary (Last 24 hours) at 01/23/14 1640 Last data filed at 01/23/14 4446  Gross per 24 hour  Intake    430 ml  Output      0 ml  Net    430 ml   Vitals reviewed  General appearance: alert, sitting on chair in NAD  Eyes: scleral icterus present  Lungs: clear to auscultation bilaterally with no wheezes or rhonchi  Heart: regular rate and rhythm, no murmur, rub or gallop  Abdomen: soft, non-tender, mildly distended, and obese  Extremities: trace LE edema bilaterally  Neurologic: AAO x2, following commands, intermittent confabulation   Lab Results: Basic Metabolic Panel:  Recent Labs Lab 01/21/14 0500 01/22/14 0815  NA 132* 139  K 4.3 3.8  CL 100 105  CO2 17* 18*  GLUCOSE 485* 136*  BUN 47* 44*  CREATININE 2.56* 2.44*  CALCIUM 6.7* 6.8*   Liver Function Tests:  Recent Labs Lab 01/20/14 1800  AST 47*  ALT 9  ALKPHOS 585*  BILITOT 3.6*  PROT 6.5  ALBUMIN 1.2*   CBC:  Recent Labs Lab 01/20/14 1800 01/21/14 0500  WBC 7.6 8.2  NEUTROABS  --  6.8  HGB 9.2* 9.2*  HCT 28.2* 28.5*  MCV 95.9 100.4*  PLT 205 192   CBG:  Recent Labs Lab 01/22/14 0751 01/22/14 1130 01/22/14 1702 01/22/14 2203 01/23/14 0745 01/23/14 1148  GLUCAP 118* 157* 112* 180* 223* 204*   Coagulation:  Recent Labs Lab 01/21/14 0900  LABPROT 32.3*  INR 3.14*   Micro Results: Recent Results (from the past 240 hour(s))  MRSA PCR SCREENING     Status: None   Collection Time    01/20/14  5:28 PM      Result  Value Ref Range Status   MRSA by PCR NEGATIVE  NEGATIVE Final   Comment:            The GeneXpert MRSA Assay (FDA     approved for NASAL specimens     only), is one component of a     comprehensive MRSA colonization     surveillance program. It is not     intended to diagnose MRSA     infection nor to guide or     monitor treatment for     MRSA infections.   Medications: I have reviewed the patient's current medications. Scheduled Meds: . abacavir  600 mg Oral Daily  . acyclovir  800 mg Oral Daily  . aspirin EC  81 mg Oral Daily  .  ceFAZolin (ANCEF) IV  2 g Intravenous Q12H  . cloNIDine  0.3 mg Transdermal Weekly  . Darunavir Ethanolate  800 mg Oral Q breakfast  . hydrALAZINE  75 mg Oral 3 times per day  . hydrocerin  1 application Topical BID  . insulin aspart  0-15 Units Subcutaneous TID WC  . insulin glargine  30 Units Subcutaneous  QHS  . labetalol  600 mg Oral BID  . lacosamide  150 mg Oral BID  . lactulose  20 g Oral TID  . lamiVUDine  100 mg Oral Daily  . levETIRAcetam  1,500 mg Oral BID  . levofloxacin  500 mg Oral Q48H  . pantoprazole  40 mg Oral Daily  . ritonavir  100 mg Oral Q breakfast  . 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   HCAP: She had CXR at OSH with patchy infiltrates in the left midlung and right lung base c/w with PNA and concerning for HCAP given her multiple hospitalizations in the past 30 days. She remains afebrile here with no oxygen supplementation requirement.  -Continuing IV Ancef (which she was on for her bacteremia) and + Levaquin PO (day 3/3)   MSSA Bacteremia: Diagnosed  on previous hospitalization by 2/2 BC. Was on IV Vanc and Zosyn due to concerns of HCAP, but now on IV Cefazolin.  -Will continue IV cefazolin for 4 weeks total, to end on Sept 7th  -Return to SNF for remainder of Abx treatment, will need ID follow up and RIJ line removed   HTN: Improved but still elevated to 170s/80s. Home meds include labetalol  BID clonidine 0.3 TID, and hydralazine  TID. She has hx of hives with amlodipine  -Continue labetalol  BID  -Continue clonidine 0.3mg  patch  -Cotninue Hydralazine to  TID from  TID  -Holding lisinopril  daily due to renal function, may resume as it remains stable   Acute on Chronic Anemia: Asymptomatic. Her baseline Hg is between 6-8. Hgb here stable at 9.2 s/p transfusion at OSH. 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 Drug induced anemia by hydralazine has also been proposed as a contributing factor since her ANA titer increased with the use of this medication.  -Will transfuse if HgB<6 or if she becomes symptomatic  -Aranesp on Sept 1 --will need Aranesp 212mcg-300mcg every 2-3 weeks   Chronic Renal Failure: Stable, at baseline. Baseline Cr in the 2.2-2.7 range.  -May resume ACEi if remains stable   DMII: Last Hg A1C 7.5% on 11/13/13. On Lantus 30 u qHS and Novolog 10-25 u ac tid at home. CBGs have been <200 since increasing lantus to 30u.  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 Secondary to Hepatitis C infection. She has no signs or symptoms of hepatic encephalopathy or SBP. ALP is trending down.  -Will consider diagnostic/therapeutic tap of ascitic fluid if she declines  -Continue home lactulose PO titrated to 3BM/day   History of seizure: Stable with no seizure activity here. Has a history of pseudoseizures with voluntary jerking movements while following commands and remaining  fully conversant. Will monitor and continue home seizure medications.  -Continue Vimpat  -Continue Keppra   DVT prophylaxis: SCDs   Diet:  -Diet heart healthy/ Carb mod   Dispo: Disposition is deferred at this time, awaiting placement at SNF for continued IV antibiotic treatment. Anticipated discharge in approximately 1-2 day(s).    The patient does  have a current PCP Christen Bame, MD) and does need an [facility] hospital follow-up appointment after discharge.  The patient does have transportation limitations that hinder transportation to clinic appointments.  .Services Needed at time of discharge: Y = Yes, Blank = No PT:   OT:   RN:   Equipment:   Other:     LOS: 3 days   Ky Barban, MD 01/23/2014, 4:40 PM

## 2014-01-23 NOTE — Care Management Note (Addendum)
    Page 1 of 2   01/24/2014     4:17:06 PM CARE MANAGEMENT NOTE 01/24/2014  Patient:  Michelle [name], Michelle [name]   Account Number:  [id number]  Date Initiated:  01/21/2014  Documentation initiated by:  MAYO,HENRIETTA  Subjective/Objective Assessment:   dx PNA; lives with sister    PCP  Christen Bame     Action/Plan:   psych eval- 8/28 for capacity.   Anticipated DC Date:  01/24/2014   Anticipated DC Plan:  SKILLED NURSING FACILITY  In-house referral  Clinical Social Worker      DC Planning Services  CM consult      Choice offered to / List presented to:             Status of service:  Completed, signed off Medicare Important Message given?  NO (If response is "NO", the following Medicare IM given date fields will be blank) Date Medicare IM given:   Medicare IM given by:   Date Additional Medicare IM given:   Additional Medicare IM given by:    Discharge Disposition:  SKILLED NURSING FACILITY  Per UR Regulation:  Reviewed for med. necessity/level of care/duration of stay  If discussed at Long Length of Stay Meetings, dates discussed:    Comments:  01/24/14 1616 [name] RN, BSN [phone] patient has decided to go to snf today, CSW aware.  01/23/14 1612 [name] RN, BSN  [phone] NCM spoke with patient, she states she does not want to go back to [facility], she wants to go home to her sister's house to get the rest of her iv abx regimen.  NCM spoke with patient's sister, Misty Stanley , who states she does not feel comfortable giving patient iv abx and she does not want the responsibility of doing that twice a day.  NCM tried to explain this to patient, but she still states she wants to go home.  Patient's sister states she will try to talk to patient to help her understand that she needs to go back to the snf.  NCM informed  MD , MD will order psych eval for capacity.  01/21/14 8676 Verdis Prime RN MSN BSN CCM Per chart, pt transferred from [facility] to [facility] and requested transfer to [facility].  Pt went to SNF for rehab after 8/13 discharge.  Confused this a.m and thinks she was in [place], states she wants to discharge home with her home health services.  Also states she wants to be up OOB and walking.

## 2014-01-23 NOTE — Progress Notes (Signed)
  I have seen and examined the patient, and reviewed the daily progress note by Lillia Carmel, MS 4 and discussed the care of the patient with them. Please see my progress note from 01/23/2014 for further details regarding assessment and plan.    Signed:  Ky Barban, MD 01/23/2014, 4:07 PM

## 2014-01-24 LAB — GLUCOSE, CAPILLARY
GLUCOSE-CAPILLARY: 115 mg/dL — AB (ref 70–99)
GLUCOSE-CAPILLARY: 204 mg/dL — AB (ref 70–99)
Glucose-Capillary: 190 mg/dL — ABNORMAL HIGH (ref 70–99)
Glucose-Capillary: 246 mg/dL — ABNORMAL HIGH (ref 70–99)

## 2014-01-24 LAB — BASIC METABOLIC PANEL
ANION GAP: 13 (ref 5–15)
BUN: 36 mg/dL — ABNORMAL HIGH (ref 6–23)
CALCIUM: 7.1 mg/dL — AB (ref 8.4–10.5)
CO2: 18 mEq/L — ABNORMAL LOW (ref 19–32)
Chloride: 109 mEq/L (ref 96–112)
Creatinine, Ser: 2.16 mg/dL — ABNORMAL HIGH (ref 0.50–1.10)
GFR, EST AFRICAN AMERICAN: 29 mL/min — AB (ref >90)
GFR, EST NON AFRICAN AMERICAN: 25 mL/min — AB (ref >90)
Glucose, Bld: 109 mg/dL — ABNORMAL HIGH (ref 70–99)
Potassium: 3.4 mEq/L — ABNORMAL LOW (ref 3.7–5.3)
SODIUM: 140 meq/L (ref 137–147)

## 2014-01-24 LAB — CBC
HCT: 24.3 % — ABNORMAL LOW (ref 36.0–46.0)
Hemoglobin: 7.9 g/dL — ABNORMAL LOW (ref 12.0–15.0)
MCH: 32.5 pg (ref 26.0–34.0)
MCHC: 32.5 g/dL (ref 30.0–36.0)
MCV: 100 fL (ref 78.0–100.0)
PLATELETS: 153 10*3/uL (ref 150–400)
RBC: 2.43 MIL/uL — ABNORMAL LOW (ref 3.87–5.11)
RDW: 22.2 % — AB (ref 11.5–15.5)
WBC: 10.1 10*3/uL (ref 4.0–10.5)

## 2014-01-24 LAB — LEVETIRACETAM LEVEL: Levetiracetam Lvl: 115.2 ug/mL

## 2014-01-24 MED ORDER — CLONIDINE HCL 0.3 MG/24HR TD PTWK: 0.3000 mg | MEDICATED_PATCH | TRANSDERMAL | Status: DC

## 2014-01-24 MED ORDER — HYDRALAZINE HCL 50 MG PO TABS
100.0000 mg | ORAL_TABLET | Freq: Three times a day (TID) | ORAL | Status: DC
  Administered 2014-01-24: 100 mg via ORAL
  Filled 2014-01-24 (×3): qty 2

## 2014-01-24 MED ORDER — HEPARIN SOD (PORK) LOCK FLUSH 100 UNIT/ML IV SOLN
250.0000 [IU] | INTRAVENOUS | Status: AC | PRN
  Administered 2014-01-24: 250 [IU]

## 2014-01-24 MED ORDER — DARBEPOETIN ALFA-POLYSORBATE 200 MCG/0.4ML IJ SOLN: 200.0000 ug | INTRAMUSCULAR | Status: AC

## 2014-01-24 MED ORDER — CEFAZOLIN SODIUM 1-5 GM-% IV SOLN: 1.0000 g | Freq: Two times a day (BID) | INTRAVENOUS | Status: DC

## 2014-01-24 MED ORDER — HYDRALAZINE HCL 100 MG PO TABS: 100.0000 mg | ORAL_TABLET | Freq: Three times a day (TID) | ORAL | Status: AC

## 2014-01-24 NOTE — Progress Notes (Signed)
ANTIBIOTIC CONSULT NOTE - FOLLOW UP  Pharmacy Consult for ancef Indication: MSSA bacteremia  Allergies  Allergen Reactions  . 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 Morphine And Related Hives, Itching and Rash  . Norvasc [Amlodipine Besylate] Hives, Itching and Rash    Patient Measurements: Height: 5' 5.5" (166.4 cm) Weight: 190 lb 11.2 oz (86.5 kg) IBW/kg (Calculated) : 58.15  Vital Signs: Temp: 98.2 F (36.8 C) (08/28 0388) Temp src: Oral (08/28 0608) BP: 188/84 mmHg (08/28 8280) Pulse Rate: 71 (08/28 0608) Intake/Output from previous day: 08/27 0701 - 08/28 0700 In: 10 [I.V.:10] Out: -  Intake/Output from this shift: Total I/O In: 360 [P.O.:360] Out: -   Labs:  Recent Labs  01/22/14 0815 01/24/14 0530  WBC  --  10.1  HGB  --  7.9*  PLT  --  153  CREATININE 2.44* 2.16*   Estimated Creatinine Clearance: 32.7 ml/min (by C-G formula based on Cr of 2.16). No results found for this basename: VANCOTROUGH, Leodis Binet, VANCORANDOM, GENTTROUGH, GENTPEAK, GENTRANDOM, TOBRATROUGH, TOBRAPEAK, TOBRARND, AMIKACINPEAK, AMIKACINTROU, AMIKACIN,  in the last 72 hours   Microbiology: Recent Results (from the past 720 hour(s))  MRSA PCR SCREENING     Status: Abnormal   Collection Time    01/04/14  4:35 AM      Result Value Ref Range Status   MRSA by PCR POSITIVE (*) NEGATIVE Final   Comment:            The GeneXpert MRSA Assay (FDA     approved for NASAL specimens     only), is one component of a     comprehensive MRSA colonization     surveillance program. It is not     intended to diagnose MRSA     infection nor to guide or     monitor treatment for     MRSA infections.     RESULT CALLED TO, READ BACK BY AND VERIFIED WITH:     R.ZELLNER,RN 0349 01/04/14 M.CAMPBELL  CULTURE, BLOOD (ROUTINE X 2)     Status: None   Collection Time    01/05/14  3:30 PM      Result Value Ref Range Status   Specimen  Description BLOOD LEFT ARM   Final   Special Requests     Final   Value: BOTTLES DRAWN AEROBIC AND ANAEROBIC 10CC BLUE, 5CC RED   Culture  Setup Time     Final   Value: 01/05/2014 22:48     Performed at [facility]   Culture     Final   Value: STAPHYLOCOCCUS AUREUS     Note: RIFAMPIN AND GENTAMICIN SHOULD NOT BE USED AS SINGLE DRUGS FOR TREATMENT OF STAPH INFECTIONS.     Note: Gram Stain Report Called to,Read Back By and Verified With: Volanda Napoleon RN on 01/06/14 at 06:45 by Christie Nottingham     Performed at [facility]   Report Status 01/08/2014 FINAL   Final   Organism ID, Bacteria STAPHYLOCOCCUS AUREUS   Final  CULTURE, BLOOD (ROUTINE X 2)     Status: None   Collection Time    01/05/14  3:42 PM      Result Value Ref Range Status   Specimen Description BLOOD BLOOD LEFT FOREARM   Final   Special Requests BOTTLES DRAWN AEROBIC ONLY 5CC   Final   Culture  Setup Time     Final   Value: 01/05/2014  22:48     Performed at [facility]     Final   Value: STAPHYLOCOCCUS AUREUS     Note: SUSCEPTIBILITIES PERFORMED ON PREVIOUS CULTURE WITHIN THE LAST 5 DAYS.     Note: Gram Stain Report Called to,Read Back By and Verified With: Volanda Napoleon RN on 01/06/14 at 06:45 by Christie Nottingham     Performed at [facility]   Report Status 01/08/2014 FINAL   Final  CULTURE, BLOOD (ROUTINE X 2)     Status: None   Collection Time    01/06/14  3:35 PM      Result Value Ref Range Status   Specimen Description BLOOD LEFT ANTECUBITAL   Final   Special Requests     Final   Value: BOTTLES DRAWN AEROBIC AND ANAEROBIC 10CC AER 5CC ANA   Culture  Setup Time     Final   Value: 01/06/2014 19:02     Performed at [facility]   Culture     Final   Value: STAPHYLOCOCCUS AUREUS     Note: RIFAMPIN AND GENTAMICIN SHOULD NOT BE USED AS SINGLE DRUGS FOR TREATMENT OF STAPH INFECTIONS.     Note: Gram Stain Report Called to,Read Back By and Verified With: Junius Argyle 01/08/14  AT 0100 RIDK     Performed at [facility]   Report Status 01/10/2014 FINAL   Final   Organism ID, Bacteria STAPHYLOCOCCUS AUREUS   Final  CULTURE, BLOOD (ROUTINE X 2)     Status: None   Collection Time    01/06/14  3:45 PM      Result Value Ref Range Status   Specimen Description BLOOD LEFT HAND   Final   Special Requests     Final   Value: BOTTLES DRAWN AEROBIC AND ANAEROBIC 10CC AER 5CC ANA   Culture  Setup Time     Final   Value: 01/06/2014 19:00     Performed at [facility]   Culture     Final   Value: NO GROWTH 5 DAYS     Performed at [facility]   Report Status 01/13/2014 FINAL   Final  CULTURE, BLOOD (ROUTINE X 2)     Status: None   Collection Time    01/08/14  6:56 AM      Result Value Ref Range Status   Specimen Description BLOOD LEFT HAND   Final   Special Requests BOTTLES DRAWN AEROBIC AND ANAEROBIC 10CC   Final   Culture  Setup Time     Final   Value: 01/08/2014 10:12     Performed at [facility]   Culture     Final   Value: NO GROWTH 5 DAYS     Performed at [facility]   Report Status 01/14/2014 FINAL   Final  CULTURE, BLOOD (ROUTINE X 2)     Status: None   Collection Time    01/08/14  7:05 AM      Result Value Ref Range Status   Specimen Description BLOOD BLOOD LEFT FOREARM   Final   Special Requests BOTTLES DRAWN AEROBIC ONLY [facility]   Final   Culture  Setup Time     Final   Value: 01/08/2014 10:12     Performed at [facility]   Culture     Final   Value: NO GROWTH 5 DAYS     Performed at [facility]   Report Status 01/14/2014 FINAL  Final  CULTURE, BLOOD (ROUTINE X 2)     Status: None   Collection Time    01/10/14 12:47 AM      Result Value Ref Range Status   Specimen Description BLOOD LEFT FOREARM   Final   Special Requests     Final   Value: BOTTLES DRAWN AEROBIC AND ANAEROBIC BLUE 10 CC RED 5 CC   Culture  Setup Time     Final   Value: 01/11/2014 13:10     Performed at [facility]   Culture     Final   Value: NO GROWTH 5 DAYS     Performed at [facility]   Report Status 01/17/2014 FINAL   Final  CULTURE, BLOOD (ROUTINE X 2)     Status: None   Collection Time    01/10/14 12:53 AM      Result Value Ref Range Status   Specimen Description BLOOD LEFT HAND   Final   Special Requests BOTTLES DRAWN AEROBIC AND ANAEROBIC 10 CC   Final   Culture  Setup Time     Final   Value: 01/11/2014 13:10     Performed at [facility]   Culture     Final   Value: NO GROWTH 5 DAYS     Performed at [facility]   Report Status 01/17/2014 FINAL   Final  MRSA PCR SCREENING     Status: None   Collection Time    01/20/14  5:28 PM      Result Value Ref Range Status   MRSA by PCR NEGATIVE  NEGATIVE Final   Comment:            The GeneXpert MRSA Assay (FDA     approved for NASAL specimens     only), is one component of a     comprehensive MRSA colonization     surveillance program. It is not     intended to diagnose MRSA     infection nor to guide or     monitor treatment for     MRSA infections.    Anti-infectives   Start     Dose/Rate Route Frequency Ordered Stop   01/21/14 2359  vancomycin (VANCOCIN) 1,500 mg in sodium chloride 0.9 % 500 mL IVPB  Status:  Discontinued     1,500 mg 250 mL/hr over 120 Minutes Intravenous Every 24 hours 01/20/14 2251 01/21/14 1537   01/21/14 1800  ceFAZolin (ANCEF) IVPB 2 g/50 mL premix     2 g 100 mL/hr over 30 Minutes Intravenous Every 12 hours 01/21/14 1549     01/21/14 1800  levofloxacin (LEVAQUIN) tablet 500 mg     500 mg Oral Every 48 hours 01/21/14 1551 01/23/14 1702   01/21/14 1000  acyclovir (ZOVIRAX) tablet 800 mg     800 mg Oral Daily 01/20/14 1846     01/21/14 0700  Darunavir Ethanolate (PREZISTA) tablet 800 mg     800 mg Oral Daily with breakfast 01/20/14 1846     01/21/14 0700  ritonavir (NORVIR) capsule 100 mg     100 mg Oral Daily with breakfast 01/20/14 1846     01/21/14 0400   piperacillin-tazobactam (ZOSYN) IVPB 3.375 g  Status:  Discontinued     3.375 g 12.5 mL/hr over 240 Minutes Intravenous 3 times per day 01/20/14 1822 01/21/14 1537   01/20/14 2300  vancomycin (VANCOCIN) 1,500 mg in sodium chloride 0.9 % 500 mL IVPB  1,500 mg 250 mL/hr over 120 Minutes Intravenous NOW 01/20/14 2251 01/21/14 0427   01/20/14 2000  lamiVUDine (EPIVIR) 10 MG/ML solution 100 mg     100 mg Oral Daily 01/20/14 1846     01/20/14 2000  abacavir (ZIAGEN) tablet 600 mg     600 mg Oral Daily 01/20/14 1846     01/20/14 1900  piperacillin-tazobactam (ZOSYN) IVPB 3.375 g     3.375 g 12.5 mL/hr over 240 Minutes Intravenous NOW 01/20/14 1822 01/21/14 0142      Assessment: Patient is a 53 y.o F on ancef for MSSA bacteremia with plan to treat through 9/07 to complete 4 wk treatment course.  Scr has been labile with at 2.16 today (est crcl~33).  She remains afebrile, wbc wnl, no new cx with this admit.  Plan:  1) continue ancef 2 gm IV q12h 2) monitor renal function closely and will adjust dose when appropriate 3) recheck BMP in AM  Michelle [name] P 01/24/2014,10:56 AM

## 2014-01-24 NOTE — Progress Notes (Signed)
Subjective: No overnight evens. She had no complaints today.   Objective: Vital signs in last 24 hours: Filed Vitals:   01/23/14 2304 01/24/14 0608 01/24/14 1100 01/24/14 1429  BP: 170/78 188/84 185/93 135/71  Pulse: 81 71 112 88  Temp: 98.6 F (37 C) 98.2 F (36.8 C) 98.4 F (36.9 C) 98.2 F (36.8 C)  TempSrc: Oral Oral Oral Oral  Resp: Height:      Weight:      SpO2: 98% 100% 96% 96%   Weight change:   Intake/Output Summary (Last 24 hours) at 01/24/14 1602 Last data filed at 01/24/14 0935  Gross per 24 hour  Intake    370 ml  Output      0 ml  Net    370 ml   Vitals reviewed  General appearance: alert, sitting on chair in NAD  Eyes: scleral icterus present  Lungs: clear to auscultation bilaterally with no wheezes or rhonchi  Heart: regular rate and rhythm, no murmur, rub or gallop  Abdomen: soft, non-tender, mildly distended, and obese  Extremities: trace LE edema bilaterally  Neurologic: AAO x3, following commands, intermittent confabulation   Lab Results: Basic Metabolic Panel:  Recent Labs Lab 01/22/14 0815 01/24/14 0530  NA 139 140  K 3.8 3.4*  CL 105 109  CO2 18* 18*  GLUCOSE 136* 109*  BUN 44* 36*  CREATININE 2.44* 2.16*  CALCIUM 6.8* 7.1*   Liver Function Tests:  Recent Labs Lab 01/20/14 1800  AST 47*  ALT 9  ALKPHOS 585*  BILITOT 3.6*  PROT 6.5  ALBUMIN 1.2*   CBC:  Recent Labs Lab 01/21/14 0500 01/24/14 0530  WBC 8.2 10.1  NEUTROABS 6.8  --   HGB 9.2* 7.9*  HCT 28.5* 24.3*  MCV 100.4* 100.0  PLT 192 153   CBG:  Recent Labs Lab 01/23/14 1148 01/23/14 1648 01/23/14 2105 01/24/14 0753 01/24/14 1058 01/24/14 1201  GLUCAP 204* 235* 193* 115* 190* 204*    Coagulation:  Recent Labs Lab 01/21/14 0900  LABPROT 32.3*  INR 3.14*    Micro Results: Recent Results (from the past 240 hour(s))  MRSA PCR SCREENING     Status: None   Collection Time    01/20/14  5:28 PM      Result Value Ref Range  Status   MRSA by PCR NEGATIVE  NEGATIVE Final   Comment:            The GeneXpert MRSA Assay (FDA     approved for NASAL specimens     only), is one component of a     comprehensive MRSA colonization     surveillance program. It is not     intended to diagnose MRSA     infection nor to guide or     monitor treatment for     MRSA infections.   Medications: I have reviewed the patient's current medications. Scheduled Meds: . abacavir  600 mg Oral Daily  . acyclovir  800 mg Oral Daily  . aspirin EC  81 mg Oral Daily  .  ceFAZolin (ANCEF) IV  2 g Intravenous Q12H  . cloNIDine  0.3 mg Transdermal Weekly  . Darunavir Ethanolate  800 mg Oral Q breakfast  . hydrALAZINE  100 mg Oral 3 times per day  . hydrocerin  1 application Topical BID  . insulin aspart  0-15 Units Subcutaneous TID WC  . insulin glargine  30 Units Subcutaneous QHS  . labetalol  600 mg Oral BID  . lacosamide  150 mg Oral BID  . lactulose  20 g Oral TID  . lamiVUDine  100 mg Oral Daily  . levETIRAcetam  1,500 mg Oral BID  . pantoprazole  40 mg Oral Daily  . ritonavir  100 mg Oral Q breakfast  . 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   HCAP: She had CXR at OSH with patchy infiltrates in the left midlung and right lung base c/w with PNA and concerning for HCAP given her multiple hospitalizations in the past 30 days. She completed her 3 day course of Levaquin on 8/27. She has no cough and remains afebrile.   -Continuing IV Ancef (which she was on for her bacteremia)   MSSA Bacteremia: Diagnosed on previous hospitalization by 2/2 BC. Was on IV Vanc  and Zosyn due to concerns of HCAP, but now on IV Cefazolin.  -Will continue IV cefazolin for 4 weeks total, to end on Sept 7th  -Return to SNF for remainder of Abx treatment, will need ID follow up and RIJ line removed   HTN: Improved today at 135/71. Home meds include labetalol  BID clonidine 0.3 TID, and hydralazine  TID. She has hx of hives with amlodipine  -Continue labetalol  BID  -Continue clonidine 0.3mg  patch  -Increased Hydralazine to  TID from  TID  -Holding lisinopril  daily due to renal function, may resume as it remains stable   Acute on Chronic Anemia: Asymptomatic. Her baseline Hg is between 6-8. Hgb here stable at 9.2 s/p transfusion at OSH. 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 Drug induced anemia by hydralazine has also been proposed as a contributing factor since her ANA titer increased with the use of this medication.  -Will transfuse if HgB<6 or if she becomes symptomatic  -Aranesp on Sept 1 --will need Aranesp 246mcg-300mcg every 2-3 weeks   Chronic Renal Failure: Stable, at baseline. Baseline Cr in the 2.2-2.7 range.  -May resume ACEi if remains stable   DMII: Last Hg A1C 7.5% on 11/13/13. On Lantus 30 u qHS and Novolog 10-25 u ac tid at home. CBGs have been <200 since increasing lantus to 30u.  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 Secondary to Hepatitis C infection. She has no signs or symptoms of hepatic encephalopathy or SBP.   -Will consider diagnostic/therapeutic tap of ascitic fluid if she declines  -Continue home lactulose PO titrated to at least 3BM/day   History of seizure: Stable with no seizure activity here.  -Continue Vimpat  -Continue Keppra   DVT prophylaxis: SCDs   Diet:  -Diet heart healthy/ Carb mod   Dispo: Disposition is deferred at this time, awaiting placement at SNF for  continued IV antibiotic treatment. Anticipated discharge in approximately 1 day(s).   The patient does have a current PCP Christen Bame, MD) and does need an [facility] hospital follow-up appointment after discharge.   The patient does have transportation limitations that hinder transportation to clinic appointments.   .Services Needed at time  of discharge: Y = Yes, Blank = No PT:   OT:   RN:   Equipment:   Other:     LOS: 4 days   Ky Barban, MD 01/24/2014, 4:02 PM

## 2014-01-24 NOTE — Discharge Summary (Signed)
Name: TERSA [name] MRN: [id number] DOB: [birth year] 53 y.o. PCP: Christen Bame, MD  Date of Admission: 01/20/2014  2:40 PM Date of Discharge: 01/24/2014 Attending Physician: Burns Spain, MD  Discharge Diagnosis: Principal Problem:   HCAP (healthcare-associated pneumonia) Active Problems:   Seizure   Unspecified essential hypertension   HIV disease   CKD (chronic kidney disease) stage 4, GFR 15-29 ml/min   DM2 (diabetes mellitus, type 2)   Anemia of chronic disease   HCV (hepatitis C virus)   Elevated alkaline phosphatase level   Bacteremia due to Staphylococcus, MSSA  Discharge Medications:   Medication List    STOP taking these medications       cloNIDine 0.3 MG tablet  Commonly known as:  CATAPRES  Replaced by:  cloNIDine 0.3 mg/24hr patch      TAKE these medications       abacavir 300 MG tablet  Commonly known as:  ZIAGEN  Take 600 mg by mouth daily.     acetaminophen 325 MG tablet  Commonly known as:  TYLENOL  Take 650 mg by mouth every 6 (six) hours as needed (Fever).     acyclovir 800 MG tablet  Commonly known as:  ZOVIRAX  Take 800 mg by mouth daily.     aspirin EC 81 MG tablet  Take 81 mg by mouth daily.     ceFAZolin 1-5 GM-%  Commonly known as:  ANCEF  Inject 50 mLs (1 g total) into the vein every 12 (twelve) hours. To end on September 7th     cloNIDine 0.3 mg/24hr patch  Commonly known as:  CATAPRES - Dosed in mg/24 hr  Place 1 patch (0.3 mg total) onto the skin once a week.     darbepoetin 200 MCG/0.4ML Soln injection  Commonly known as:  ARANESP  Inject 0.4 mLs (200 mcg total) into the skin every 14 (fourteen) days.     hydrALAZINE 100 MG tablet  Commonly known as:  APRESOLINE  Take 1 tablet (100 mg total) by mouth every 8 (eight) hours.     hydrocerin Crea  Apply 1 application topically 2 (two) times daily.     hydrOXYzine 10 MG tablet  Commonly known as:  ATARAX/VISTARIL  Take 1 tablet (10 mg total) by mouth 3 (three) times  daily as needed for itching.     insulin aspart 100 UNIT/ML injection  Commonly known as:  novoLOG  Inject 10-25 Units into the skin 3 (three) times daily before meals. Based on sliding scale     labetalol 300 MG tablet  Commonly known as:  NORMODYNE  Take 600 mg by mouth 2 (two) times daily.     Lacosamide 150 MG Tabs  Take 1 tablet (150 mg total) by mouth 2 (two) times daily.     lactulose 10 GM/15ML solution  Commonly known as:  CHRONULAC  Take 30 mLs (20 g total) by mouth 3 (three) times daily.     lamiVUDine 10 MG/ML solution  Commonly known as:  EPIVIR  Take 10 mLs (100 mg total) by mouth daily.     levETIRAcetam 500 MG tablet  Commonly known as:  KEPPRA  Take 1,500 mg by mouth 2 (two) times daily.     omeprazole 20 MG capsule  Commonly known as:  PRILOSEC  Take 20 mg by mouth daily before breakfast.     PREZISTA 800 MG tablet  Generic drug:  Darunavir Ethanolate  Take 800 mg by mouth daily with breakfast.  ritonavir 100 MG capsule  Commonly known as:  NORVIR  Take 100 mg by mouth daily with breakfast.     sodium bicarbonate 650 MG tablet  Take 650 mg by mouth 2 (two) times daily.        Disposition and follow-up:   Michelle [name] was discharged from [facility] in Good condition.  At the hospital follow up visit please address:  1.  Repeat BMET and evaluate need for lisinopril 20mg  which has been discontinued      Assure compliance with Ancef IV BID until September 7th       She will need her RIJ removed after Ancef completion  2.  Labs / imaging needed at time of follow-up: None  3.  Pending labs/ test needing follow-up: None  Follow-up Appointments: Follow-up Information   Follow up with Christen Bame, MD On 02/04/2014. (3:15PM)    Specialty:  Internal Medicine   Contact information:   [address] [place] [address] [phone]       Follow up with Judyann Munson, MD On 01/28/2014. (10:45AM)    Specialty:  Infectious  Diseases   Contact information:   [address] [place] [address] [phone]       Discharge Instructions:   Consultations:  None  Procedures Performed:  Ct Abdomen Pelvis Wo Contrast  01/11/2014   CLINICAL DATA:  Sepsis.  Fever.  EXAM: CT ABDOMEN AND PELVIS WITHOUT CONTRAST  TECHNIQUE: Multidetector CT imaging of the abdomen and pelvis was performed following the standard protocol without IV contrast.  COMPARISON:  01/04/2014  FINDINGS: Increasing dependent bibasilar opacities, likely atelectasis. Heart is normal size. Trace effusions present.  Subtle nodular contours of the liver are again noted suggesting cirrhosis. No visible focal abnormality on this unenhanced study. Spleen, pancreas, kidneys have an unremarkable unenhanced appearance. Right adrenal unremarkable. Mild diffuse enlargement of the left renal gland compatible with hyperplasia.  Small amount of free fluid in the cul-de-sac. Uterus, adnexae and urinary bladder are unremarkable. Stomach, large and small bowel are grossly unremarkable. Appendix is visualized and is normal. No free air or adenopathy. Aorta is normal caliber.  No acute bony abnormality or focal bone lesion.  IMPRESSION: Increasing bibasilar densities, likely atelectasis. Suspect trace effusions.  Mildly nodular contours of the liver again noted suggesting cirrhosis.  Trace free fluid in the pelvis.  No acute findings.   Electronically Signed   By: Charlett Nose M.D.   On: 01/11/2014 00:05   Ct Abdomen Pelvis Wo Contrast  01/04/2014   CLINICAL DATA:  Distended abdomen, jaundice, difficulty urinating, altered mental status  EXAM: CT ABDOMEN AND PELVIS WITHOUT CONTRAST  TECHNIQUE: Multidetector CT imaging of the abdomen and pelvis was performed following the standard protocol without IV contrast.  COMPARISON:  Radiographs 12/17/2013  FINDINGS: Bilateral subsegmental atelectasis at the lung bases. No pleural or pericardial effusion.  Mildly nodular  contour to the liver. Gallbladder is normal. Noncontrast appearance suggests the possibility of an element periportal edema. Small volume of ascites around the liver.  Spleen is normal. Pancreas is normal. Adrenal glands are normal. Kidneys are normal. Abdominal aorta shows minimal calcification with no distention.  Bladder is normal.  Reproductive organs are normal.  Stomach is decompressed which exaggerates gastric wall thickness. Allowing for this, gastric wall appears mildly thickened nonetheless. Small bowel is normal. Evaluation of the colon is difficult without IV contrast, with oral contrast not having reached the colon. The colon is not a  dilated, but the does appear to be diffuse colonic wall thickening. Mild to moderate inflammatory change tracks along the entire colon.  There are no acute musculoskeletal abnormalities.  IMPRESSION: 1. Small volume of ascites. Mildly nodular liver contour. Findings suggest possibility of cirrhosis. 2. Stomach is decompressed but shows evidence of wall thickening. Possible gastritis. 3. Evidence of mild diffuse colon wall thickening with inflammatory change surrounding the entire colon. Diffuse colitis suspected.   Electronically Signed   By: Esperanza Heir M.D.   On: 01/04/2014 01:26   Dg Chest 2 View  01/05/2014   CLINICAL DATA:  Fever, cough, chest pain and shortness of breath.  EXAM: CHEST  2 VIEW  COMPARISON:  Chest x-Fagerstrom 12/14/2013.  FINDINGS: Linear opacity in the lower left lung is unchanged, compatible with an area of mild scarring. Right internal jugular single-lumen power porta cath with tip terminating in the distal superior vena cava is unchanged. Lung volumes are low. No consolidative airspace disease. No pleural effusions. No pneumothorax. No pulmonary nodule or mass noted. Pulmonary vasculature and the cardiomediastinal silhouette are within normal limits.  IMPRESSION: 1. Low lung volumes without radiographic evidence of acute cardiopulmonary disease. 2.  Mild scarring in the inferior aspect of the left lung, similar prior examinations.   Electronically Signed   By: Trudie Reed M.D.   On: 01/05/2014 19:15   Ct Head Wo Contrast  01/04/2014   CLINICAL DATA:  Lethargy and altered mental status.  EXAM: CT HEAD WITHOUT CONTRAST  TECHNIQUE: Contiguous axial images were obtained from the base of the skull through the vertex without intravenous contrast.  COMPARISON:  CT of the head performed 10/23/2013  FINDINGS: There is no evidence of acute infarction, mass lesion, or intra- or extra-axial hemorrhage on CT.  Prominence of the ventricles and sulci suggests mild cortical volume loss. Scattered periventricular white matter change likely reflects small vessel ischemic microangiopathy.  The brainstem and fourth ventricle are within normal limits. The basal ganglia are unremarkable in appearance. The cerebral hemispheres demonstrate grossly normal gray-white differentiation. No mass effect or midline shift is seen.  There is no evidence of fracture; visualized osseous structures are unremarkable in appearance. There is apparent mild bilateral proptosis. The paranasal sinuses and mastoid air cells are well-aerated. No significant soft tissue abnormalities are seen.  IMPRESSION: 1. No acute intracranial pathology seen on CT. 2. Mild cortical volume loss and scattered small vessel ischemic microangiopathy. 3. Apparent mild bilateral proptosis noted.   Electronically Signed   By: Roanna Raider M.D.   On: 01/04/2014 01:25   Ct Soft Tissue Neck Wo Contrast  12/29/2013   CLINICAL DATA:  53 year old HIV positive female with palatal swelling, drooling and decreased ability to swallow.  EXAM: CT NECK WITHOUT CONTRAST  TECHNIQUE: Multidetector CT imaging of the neck was performed following the standard protocol without intravenous contrast.  COMPARISON:  Prior head CT 10/23/2013  FINDINGS: Extremely limited evaluation in the absence of intravenous contrast. The visualized  intracranial contents demonstrate no acute abnormality. The visualized paranasal sinuses and mastoid air cells are normally aerated. Evaluation of the palate is limited by streak artifact related to multiple dental amalgams.  High palatal arch. There is some asymmetric soft tissue attenuation material low just left of midline at the junction of the heart and soft palate. In the absence image venous contrast material this is very nonspecific. This could represent tenacious protein ridge secretions, blood products, or a soft tissue mass. This region should be amenable to direct inspection. No  invasion into the surrounding soft tissues. Otherwise, the palatal and faucial tonsils are symmetric. The uvula is unremarkable as is the epiglottis and airway. Unremarkable parotid and salivary glands. No focal soft tissue fluid collection. Right IJ approach port catheter incompletely imaged. No suspicious adenopathy.  Unremarkable upper lungs.  IMPRESSION: 1. Nonspecific 1.5 x 0.8 cm soft tissue attenuation structure exophytic from the soft palate just left of midline. In the absence of intravenous contrast for complete evaluation this could represent proteinaceous secretions, blood products or a soft tissue mass. This region should be amenable to direct visualization. No evidence of invasion into the adjacent soft tissues or bony structures. 2. Otherwise, unremarkable noncontrast appearance of the soft tissues of the neck. 3. Incompletely imaged right IJ approach portacatheter.   Electronically Signed   By: Malachy Moan M.D.   On: 12/29/2013 10:33   Mr Thoracic Spine Wo Contrast  01/11/2014   CLINICAL DATA:  Evaluate fever of unknown origin.  EXAM: MRI THORACIC AND LUMBAR SPINE WITHOUT CONTRAST  TECHNIQUE: Multiplanar and multiecho pulse sequences of the thoracic and lumbar spine were obtained without intravenous contrast.  COMPARISON:  CT of the abdomen and pelvis January 14, 2014  FINDINGS: MR THORACIC SPINE FINDINGS   Thoracic vertebral bodies and posterior elements are intact and aligned with maintenance of thoracic kyphosis. Sub cm bright T1 and bright T2 hemangioma T7. No abnormal STIR signal. Intervertebral discs demonstrate normal morphology and signal characteristics.  The ventral thoracic spinal cord deformity due to disc protrusion as described below, the spinal cord is overall normal in morphology and signal characteristics the conus medullaris which terminates at T12-L1. No abnormal epidural fluid collections. Partially imaged small bilateral pleural effusions. Mild subcutaneous dependent edema within the lower thoracic spine.  Minimal annular bulging at C6-7. At T7-8 is moderate left central disc protrusion with possible annular fissure resulting in mild to moderate canal stenosis, left ventral spinal cord deformity. Possible small left subarticular to extra foraminal T8-9 disc protrusion resulting in moderate left T8-9 neural foraminal narrowing.  MR LUMBAR SPINE FINDINGS  Lumbar vertebral bodies and posterior elements are intact and aligned and maintenance of lumbar lordosis. Intervertebral discs demonstrate normal morphology and signal characteristics. No abnormal bone marrow signal, specifically no bright STIR signal to suggest acute osseous process.  Conus medullaris terminates at T12-L1 and appears normal in morphology and signal characteristics. 1 mm bright T1 signal along the filum terminale may reflect fibrolipomatous changes without cord tethering. Mild paraspinal muscle atrophy. Dependent subcutaneous edema and grade 1 paraspinal muscle strain.  Level by level evaluation:  T12-L1, L1-2, L2-3, L3-4 and L4-5: No disc bulge. Mild facet arthropathy without canal stenosis or neural foraminal narrowing.  L5-S1: No disc bulge. Moderate facet arthropathy. No canal stenosis. Mild neural foraminal narrowing.  IMPRESSION: MR THORACIC SPINE IMPRESSION  No findings of discitis, osteomyelitis nor epidural abscess on this  nonenhanced examination.  Thoracic spondylosis, including T7-8 moderate left central disc protrusion resulting in mild to moderate canal stenosis. Possible left subarticular and T8-9 small disc protrusion resulting in moderate left T8-9 neural foraminal narrowing.  MR LUMBAR SPINE IMPRESSION  No findings of discitis, osteomyelitis nor epidural abscess on this nonenhanced examination.  Grade 1 paraspinal muscle strain.  Facet arthropathy without neurocompressive changes.   Electronically Signed   By: Awilda Metro   On: 01/11/2014 00:13   Mr Lumbar Spine Wo Contrast  01/11/2014   CLINICAL DATA:  Evaluate fever of unknown origin.  EXAM: MRI THORACIC AND LUMBAR SPINE WITHOUT  CONTRAST  TECHNIQUE: Multiplanar and multiecho pulse sequences of the thoracic and lumbar spine were obtained without intravenous contrast.  COMPARISON:  CT of the abdomen and pelvis January 14, 2014  FINDINGS: MR THORACIC SPINE FINDINGS  Thoracic vertebral bodies and posterior elements are intact and aligned with maintenance of thoracic kyphosis. Sub cm bright T1 and bright T2 hemangioma T7. No abnormal STIR signal. Intervertebral discs demonstrate normal morphology and signal characteristics.  The ventral thoracic spinal cord deformity due to disc protrusion as described below, the spinal cord is overall normal in morphology and signal characteristics the conus medullaris which terminates at T12-L1. No abnormal epidural fluid collections. Partially imaged small bilateral pleural effusions. Mild subcutaneous dependent edema within the lower thoracic spine.  Minimal annular bulging at C6-7. At T7-8 is moderate left central disc protrusion with possible annular fissure resulting in mild to moderate canal stenosis, left ventral spinal cord deformity. Possible small left subarticular to extra foraminal T8-9 disc protrusion resulting in moderate left T8-9 neural foraminal narrowing.  MR LUMBAR SPINE FINDINGS  Lumbar vertebral bodies and posterior  elements are intact and aligned and maintenance of lumbar lordosis. Intervertebral discs demonstrate normal morphology and signal characteristics. No abnormal bone marrow signal, specifically no bright STIR signal to suggest acute osseous process.  Conus medullaris terminates at T12-L1 and appears normal in morphology and signal characteristics. 1 mm bright T1 signal along the filum terminale may reflect fibrolipomatous changes without cord tethering. Mild paraspinal muscle atrophy. Dependent subcutaneous edema and grade 1 paraspinal muscle strain.  Level by level evaluation:  T12-L1, L1-2, L2-3, L3-4 and L4-5: No disc bulge. Mild facet arthropathy without canal stenosis or neural foraminal narrowing.  L5-S1: No disc bulge. Moderate facet arthropathy. No canal stenosis. Mild neural foraminal narrowing.  IMPRESSION: MR THORACIC SPINE IMPRESSION  No findings of discitis, osteomyelitis nor epidural abscess on this nonenhanced examination.  Thoracic spondylosis, including T7-8 moderate left central disc protrusion resulting in mild to moderate canal stenosis. Possible left subarticular and T8-9 small disc protrusion resulting in moderate left T8-9 neural foraminal narrowing.  MR LUMBAR SPINE IMPRESSION  No findings of discitis, osteomyelitis nor epidural abscess on this nonenhanced examination.  Grade 1 paraspinal muscle strain.  Facet arthropathy without neurocompressive changes.   Electronically Signed   By: Awilda Metro   On: 01/11/2014 00:13   US Abdomen Complete  01/08/2014   CLINICAL DATA:  Right upper quadrant pain with elevated hepatic function studies and history of cirrhosis, HIV and diabetes. Evaluate for ascites.  EXAM: ULTRASOUND ABDOMEN COMPLETE  COMPARISON:  Renal ultrasound of December 18, 2013  FINDINGS: Gallbladder:  The gallbladder is adequately distended with no evidence of stones. There is no positive sonographic Murphy's sign. There is no pericholecystic fluid.  Common bile duct:  Diameter: 3.3  mm  Liver:  There is no focal mass or ductal dilation. Portal venous flow is normal in direction toward the liver.  IVC:  No abnormality visualized.  Pancreas:  Visualized portion unremarkable.  Spleen:  The spleen exhibits normal echotexture and measures 9.5 cm in greatest dimension  Right Kidney:  Length: 10.1 cm. The echotexture of the right kidney is increased. There is no focal mass or hydronephrosis.  Left Kidney:  Length: 10.9 cm. The cortical echotexture of the left kidney is also mildly increased. There is no hydronephrosis or focal mass. 2.5 cm  Abdominal aorta:  No aneurysm visualized.  Other findings:  No ascites is demonstrated.  IMPRESSION: 1. Increased echotexture both kidneys is consistent  with medical renal disease. There is no hydronephrosis. 2. The liver, gallbladder, pancreas, and common bile duct exhibit no acute abnormalities. 3. No ascites is demonstrated.   Electronically Signed   By: David  [place]   On: 01/08/2014 14:41   Ir Fluoro Guide Cv Line Right  01/14/2014   CLINICAL DATA:  HIV  EXAM: RIGHT INTERNAL JUGULAR TUNNELED PICC LINE PLACEMENT WITH ULTRASOUND AND FLUOROSCOPIC GUIDANCE  FLUOROSCOPY TIME:  12 seconds.  PROCEDURE: The patient was advised of the possible risks andcomplications and agreed to undergo the procedure. The patient was then brought to the angiographic suite for the procedure.  The right neck was prepped with chlorhexidine, drapedin the usual sterile fashion using maximum barrier technique (cap and mask, sterile gown, sterile gloves, large sterile sheet, hand hygiene and cutaneous antisepsis) and infiltrated locally with 1% Lidocaine.  Ultrasound demonstrated patency of the right internal jugular vein, and this was documented with an image. Under real-time ultrasound guidance, this vein was accessed with a 21 gauge micropuncture needle and image documentation was performed. A 0.018 wire was introduced in to the vein. Over this, a 5 [place] double lumen Power tunneled  PICC was advanced to the lower SVC/right atrial junction. The cuff was positioned in the subcutaneous tract. Fluoroscopy during the procedure and fluoro spot radiograph confirms appropriate catheter position. The catheter was flushed and covered with asterile dressing.  Complications: None  IMPRESSION: Successful right internal jugular Power tunneled PICC line placement with ultrasound and fluoroscopic guidance. The catheter is ready for use.   Electronically Signed   By: Maryclare Bean M.D.   On: 01/14/2014 12:08   Ir Removal [employer] Access W/ Port W/o Fl Mod Sed  01/07/2014   CLINICAL DATA:  Infected right internal jugular vein Port-A-Cath.  EXAM: REMOVAL RIGHT IJ VEIN PORT-A-CATH  PROCEDURE: The right chest was prepped and draped in a sterile fashion. Lidocaine was utilized for local anesthesia. An incision was made over the previously healed surgical incision. Utilizing blunt dissection, the port catheter and reservoir were removed from the underlying subcutaneous tissue in their entirety. Securing sutures were also removed. The pocket was irrigated with a copious amount of sterile normal saline. Iodoform gauze was packed into the surgical bed. A sterile dressing was applied.  IMPRESSION: Successful removal of a Port-A-Cath for infection. The pocket was packed with iodoform which will need to be retracted an inch every day. The wound should hopefully close by secondary intention.   Electronically Signed   By: Maryclare Bean M.D.   On: 01/07/2014 12:02   Ir US Guide Vasc Access Right  01/14/2014   CLINICAL DATA:  HIV  EXAM: RIGHT INTERNAL JUGULAR TUNNELED PICC LINE PLACEMENT WITH ULTRASOUND AND FLUOROSCOPIC GUIDANCE  FLUOROSCOPY TIME:  12 seconds.  PROCEDURE: The patient was advised of the possible risks andcomplications and agreed to undergo the procedure. The patient was then brought to the angiographic suite for the procedure.  The right neck was prepped with chlorhexidine, drapedin the usual sterile fashion using  maximum barrier technique (cap and mask, sterile gown, sterile gloves, large sterile sheet, hand hygiene and cutaneous antisepsis) and infiltrated locally with 1% Lidocaine.  Ultrasound demonstrated patency of the right internal jugular vein, and this was documented with an image. Under real-time ultrasound guidance, this vein was accessed with a 21 gauge micropuncture needle and image documentation was performed. A 0.018 wire was introduced in to the vein. Over this, a 5 [place] double lumen Power tunneled PICC was advanced to the lower  SVC/right atrial junction. The cuff was positioned in the subcutaneous tract. Fluoroscopy during the procedure and fluoro spot radiograph confirms appropriate catheter position. The catheter was flushed and covered with asterile dressing.  Complications: None  IMPRESSION: Successful right internal jugular Power tunneled PICC line placement with ultrasound and fluoroscopic guidance. The catheter is ready for use.   Electronically Signed   By: Maryclare Bean M.D.   On: 01/14/2014 12:08    Admission HPI:  Ms. Nadel is a 53 year old female with extensive PMH including HIV and Hep C with recent hospital admission and discharge on 8/19 for Sepsis 2/2 to MSSA bacteremia (discharged with PICC line for IV Ancef), uncontrolled HTN, seizure disorder, and anemia who [facility] to [facility] from SNF on 8/23 for worsening SOB and chest pain x4 days. At that time on admission she was found to have severely elevated bP 241/112, rales and rhonchi on physical exam, and confusion. Hb on admission was 6.8 and Cr was down to 2.2 from 2.72 on prior discharge. ABG on admission:7.43/36/49/23.9 with ALP 743 and proBNP 8760 (1083 in July 2015). Portable CXR showed patchy atelectasis or infiltrate in left midlung and right base.  Thus, she was admitted for acute hypoxic respiratory failure thought to be secondary to HCAP vs. Aspiration PNA. Blood and sputum cultures were drawn and she was started on IV  Zosyn, Levaquin, and Vancomycin.  EKG from hospital notes without a date shows 76bpm, NSR, TWI in lead III, with ?flattening in aVF and V3  Per patient's request, she was transferred to [facility] and to our service for further care.  Today, she is in good spirits, not in any acute distress and without any complaints. She reports having a BM this morning and no dysuria. Currently no CP and says her SOB has gotten much better. She is alert, awake, and oriented to person, place, and day but not date. She was on 2L [place] with o2 sat 100% and did not desat when taken off oxygen. Her only complaint is itching.    Hospital Course by problem list:  HCAP: Her CXR at the OSH hospital showed patchy atelectasis or infiltrates in the left midlung and right lung base. Her HCAP risk factors include being hospitalized for 2 or more days within the past 90 days (discharged on 8/21), being a resident of a nursing home, and being recipient of recent intravenous antibiotic therapy. She was initially given IV Vancomycin and Zosyn for broad coverage, as OSH sputum and blood culture were unavailable. Throughout her hospital stay, her O2 sats were between 96-100% on room air, and she has been afebrile with a normal WBC count. She was switched from IV Vanc and Zosyn to IV Ancef on 8/25, which is what she has been receiving due to recent MSSA bacteremia. She was also given a 3 day course of Levoquin which was completed on 8/27. Currently she has no cough and no need for increased oxygen supplementation.   MSSA Bacteremia: She had 2/2 blood cultures on 8/10 that grew MSSA. The source of her bacteremia was likely her port-a-cath placed on 12/05/13, and this was removed on 8/11. A right IJ PICC was placed on 8/18. She was receiving IV Cefazolin prior to presenting at the OSH. Due to concerns for HCAP, she was switched to IV Vanc and Zosyn due to concerns of HCAP. She was again transitioned to IV Cefazolin due to low suspicion of HCAP and  concurrent 3 day course of Levaquin. Her IV Cefazolin  course should be completed on Sept 7th for a total 4 week course. She will have follow up with Infectious disease prior to finishing her antibiotic treatment.   HTN: She presented hypertensive with BP >190/90 for the first couple of days of hospitalization. Her blood pressure was dropped gradually due to her being asymptomatic and the fact that she is chronically hypertensive and likely has a higher threshold of hypotension. She was given labetalol 600mg  BID and converted to clonidine 0.3 patch to increase compliance in the outpatient setting. Her hydralazine was also restarted and titrated up to 100mg  TID, which seems to be a successful combination of therapy to control her HTN. May start ACEi if she remains hypertensive and renal function allows.   Acute on Chronic Anemia: Her baseline Hg is between 6-8, but she received a transfusion at the OSH and presented with a Hg of 9.2. She should receive Aranesp 229mcg-300mcg every 2-3 weeks. 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 Her Hg was trended and is 7.9 today, which is at her baseline. She is due to receive Aranesp on Sept 1 and per hematology she should not be transfused unless she becomes symptomatic .   Chronic Renal Failure: Baseline Cr is between 2.2-2.7. Her renal function was monitored throughout the hospitalization and her Cr remained between 2.1 and 2.5. Her Cr today is 2.16.   DMII: Her laast Hg A1C was 7.5% on 11/13/13. She was initially given Lantus 20u, but her glucose >400, so she was given Lantus 30 u qHS and Novolog 10-25 u actid per her home regime. CBGs have been <200 since increasing lantus to 30u.   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 She had no signs or symptoms of SBP. She did not receive 2 doses of lactulose on 8/26 and she was mildly confused  on 8/27. She was given lactulose and she returned to her baseline mental status within a few hours. She was otherwise given lactulose as scheduled with no other episodes of confusion.   History of seizure: She has a history of pseudoseizures with voluntary jerking movements while following commands and remaining fully conversant. She did not have any seizure like activity during this hospitalization. She was continued on her home seizure medications (Vimpat and Keppra)   HIV disease: Well controlled. Her viral load was rechecked and was undetectable on 01/14/14. Her CD4 count was also checked and had a count of 240. It was at 280 in April 2015. She was continued on home abacavir, prezista, norvir, and epivir. She was also continued on acyclovir for prophylaxis.   Hepatitis C: Genotype in December 2014. Genotype 1b, viral load of 4276200, Log of 6.63. She will f/u with Dr. Drue Second in ID for possible treatment of Hep C   Discharge Vitals:   BP 135/71  Pulse 88  Temp(Src) 98.2 F (36.8 C) (Oral)  Resp 16  Ht 5' 5.5" (1.664 m)  Wt 190 lb 11.2 oz (86.5 kg)  BMI 31.24 kg/m2  SpO2 96%  Discharge Labs:  Results for orders placed during the hospital encounter of 01/20/14 (from the past 24 hour(s))  GLUCOSE, CAPILLARY     Status: Abnormal   Collection Time    01/23/14  4:48 PM      Result Value Ref Range   Glucose-Capillary 235 (*) 70 - 99 mg/dL  GLUCOSE, CAPILLARY  Status: Abnormal   Collection Time    01/23/14  9:05 PM      Result Value Ref Range   Glucose-Capillary 193 (*) 70 - 99 mg/dL  BASIC METABOLIC PANEL     Status: Abnormal   Collection Time    01/24/14  5:30 AM      Result Value Ref Range   Sodium 140  137 - 147 mEq/L   Potassium 3.4 (*) 3.7 - 5.3 mEq/L   Chloride 109  96 - 112 mEq/L   CO2 18 (*) 19 - 32 mEq/L   Glucose, Bld 109 (*) 70 - 99 mg/dL   BUN 36 (*) 6 - 23 mg/dL   Creatinine, Ser 1.61 (*) 0.50 - 1.10 mg/dL   Calcium 7.1 (*) 8.4 - 10.5 mg/dL   GFR calc non Af Amer  25 (*) >90 mL/min   GFR calc Af Amer 29 (*) >90 mL/min   Anion gap 13  5 - 15  CBC     Status: Abnormal   Collection Time    01/24/14  5:30 AM      Result Value Ref Range   WBC 10.1  4.0 - 10.5 K/uL   RBC 2.43 (*) 3.87 - 5.11 MIL/uL   Hemoglobin 7.9 (*) 12.0 - 15.0 g/dL   HCT 09.6 (*) 04.5 - 40.9 %   MCV 100.0  78.0 - 100.0 fL   MCH 32.5  26.0 - 34.0 pg   MCHC 32.5  30.0 - 36.0 g/dL   RDW 81.1 (*) 91.4 - 78.2 %   Platelets 153  150 - 400 K/uL  GLUCOSE, CAPILLARY     Status: Abnormal   Collection Time    01/24/14  7:53 AM      Result Value Ref Range   Glucose-Capillary 115 (*) 70 - 99 mg/dL  GLUCOSE, CAPILLARY     Status: Abnormal   Collection Time    01/24/14 10:58 AM      Result Value Ref Range   Glucose-Capillary 190 (*) 70 - 99 mg/dL  GLUCOSE, CAPILLARY     Status: Abnormal   Collection Time    01/24/14 12:01 PM      Result Value Ref Range   Glucose-Capillary 204 (*) 70 - 99 mg/dL    Signed: Ky Barban, MD 01/24/2014, 4:43 PM    Services Ordered on Discharge: None  Equipment Ordered on Discharge: None

## 2014-01-24 NOTE — Progress Notes (Signed)
Inpatient Diabetes Program Recommendations  AACE/ADA: New Consensus Statement on Inpatient Glycemic Control (2013)  Target Ranges:  Prepandial:   less than 140 mg/dL      Peak postprandial:   less than 180 mg/dL (1-2 hours)      Critically ill patients:  140 - 180 mg/dL   Reason for Assessment:  Results for ALECHIA, COUSINEAU (MRN [id number]) as of 01/24/2014 11:14  Ref. Range 01/23/2014 11:48 01/23/2014 16:48 01/23/2014 21:05 01/24/2014 07:53 01/24/2014 10:58  Glucose-Capillary Latest Range: 70-99 mg/dL 056 (H) 979 (H) 480 (H) 115 (H) 190 (H)   Please consider adding Novolog meal coverage 4 units tid with meals (hold if patient eats less than 50%).  Thanks, Beryl Meager, RN, BC-ADM Inpatient Diabetes Coordinator Pager [phone]

## 2014-01-24 NOTE — Progress Notes (Signed)
  I have seen and examined the patient, and reviewed the daily progress note by Lillia Carmel, MS 4 and discussed the care of the patient with them. Please see my progress note from 01/24/2014 for further details regarding assessment and plan.    Signed:  Ky Barban, MD 01/24/2014, 7:47 PM

## 2014-01-24 NOTE — Clinical Social Work Note (Signed)
Patient for d/c today to SNF bed at [facility]. Patient agreeable to this plan- plan transfer via EMS. Reece Levy, MSW, Theresia Majors [phone]

## 2014-01-24 NOTE — Clinical Social Work Note (Signed)
Patient aware of plans to return to SNF bed at [facility]- she understands this is needed to finish her treatment and rehabilitation and is her only offer- MD aware- awaiting final dc orders-  Reece Levy, MSW, Theresia Majors [phone]

## 2014-01-24 NOTE — Progress Notes (Signed)
Patient was discharged to SNF [facility]) by MD order; discharged instructions review and sent to facility; PICC line in place;  skin intact; patient will be transported to facility via EMS. Facility was called and report was given to nurse.

## 2014-01-24 NOTE — Progress Notes (Signed)
Subjective: Michelle [name] had no acute events overnight. She has been afebrile, having regular BMs, and continues to have a good appetite. No complaints.  Objective: Vital signs in last 24 hours: Filed Vitals:   01/23/14 1325 01/23/14 2304 01/24/14 0608 01/24/14 1100  BP: 181/93 170/78 188/84 185/93  Pulse: 89 81 71 112  Temp: 98.6 F (37 C) 98.6 F (37 C) 98.2 F (36.8 C) 98.4 F (36.9 C)  TempSrc: Oral Oral Oral Oral  Resp:  Height:      Weight:      SpO2: 100% 98% 100% 96%   Weight change:   Intake/Output Summary (Last 24 hours) at 01/24/14 1218 Last data filed at 01/24/14 0935  Gross per 24 hour  Intake    370 ml  Output      0 ml  Net    370 ml   Physical Exam  General appearance: alert, sitting on chair in NAD  Eyes: scleral icterus Lungs: CTAB, no crackles or rhonchi  Heart: regular rate and rhythm, no murmur, rub or gallop  Abdomen: soft, non-tender, mildly distended, and obese  Extremities: trace LE edema bilaterally  Neurologic: AAO x3, fluid train of throught  Lab Results: Basic Metabolic Panel:  Recent Labs Lab 01/22/14 0815 01/24/14 0530  NA 139 140  K 3.8 3.4*  CL 105 109  CO2 18* 18*  GLUCOSE 136* 109*  BUN 44* 36*  CREATININE 2.44* 2.16*  CALCIUM 6.8* 7.1*   Liver Function Tests:  Recent Labs Lab 01/20/14 1800  AST 47*  ALT 9  ALKPHOS 585*  BILITOT 3.6*  PROT 6.5  ALBUMIN 1.2*   CBC:  Recent Labs Lab 01/21/14 0500 01/24/14 0530  WBC 8.2 10.1  NEUTROABS 6.8  --   HGB 9.2* 7.9*  HCT 28.5* 24.3*  MCV 100.4* 100.0  PLT 192 153   CBG:  Recent Labs Lab 01/23/14 0745 01/23/14 1148 01/23/14 1648 01/23/14 2105 01/24/14 0753 01/24/14 1058  GLUCAP 223* 204* 235* 193* 115* 190*   Coagulation:  Recent Labs Lab 01/21/14 0900  LABPROT 32.3*  INR 3.14*   Micro Results: Recent Results (from the past 240 hour(s))  MRSA PCR SCREENING     Status: None   Collection Time    01/20/14  5:28 PM      Result Value Ref  Range Status   MRSA by PCR NEGATIVE  NEGATIVE Final   Comment:            The GeneXpert MRSA Assay (FDA     approved for NASAL specimens     only), is one component of a     comprehensive MRSA colonization     surveillance program. It is not     intended to diagnose MRSA     infection nor to guide or     monitor treatment for     MRSA infections.   Medications:  Scheduled Meds: . abacavir  600 mg Oral Daily  . acyclovir  800 mg Oral Daily  . aspirin EC  81 mg Oral Daily  .  ceFAZolin (ANCEF) IV  2 g Intravenous Q12H  . cloNIDine  0.3 mg Transdermal Weekly  . Darunavir Ethanolate  800 mg Oral Q breakfast  . hydrALAZINE  100 mg Oral 3 times per day  . hydrocerin  1 application Topical BID  . insulin aspart  0-15 Units Subcutaneous TID WC  . insulin glargine  30 Units Subcutaneous QHS  . labetalol  600 mg  Oral BID  . lacosamide  150 mg Oral BID  . lactulose  20 g Oral TID  . lamiVUDine  100 mg Oral Daily  . levETIRAcetam  1,500 mg Oral BID  . pantoprazole  40 mg Oral Daily  . ritonavir  100 mg Oral Q breakfast  . 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   HCAP: HCAP risk factors (hospitalized for 2 or more days within the past 90 days (discharged on 8/21), resident of a nursing home, recipient of recent intravenous antibiotic therapy). OSH chest x-Pike showed patchy atelectasis or infiltrates in the left midlung and right lung base. Her O2 sats have been between 98-100% on room air. She has been afebrile and has a normal WBC count.  -Continuing IV Ancef (which she was on for her  bacteremia)  -Completed 3 day course of Levaquin PO on 8/27  MSSA Bacteremia: Afebrile. Antibiotic day 18. Was on IV Vanc and Zosyn due to concerns of HCAP, but now on IV Cefazolin, which should be completed on Sept 7th. She had 2/2 blood cultures on 8/10 that grew MSSA.  -Will continue IV cefazolin for 4 weeks total and will end on Sept 7th -ID follow up in place  HTN: Improving. May start ACEi if she remains hypertensive and renal function allows. However, she is chronically >200/90, so will gradually lower BP.  -labetalol  BID  -clonidine patch  -Increasing Hydralazine from  to  TID  -Holding lisinopril    Acute on Chronic Anemia: Asymptomatic. Her baseline Hg is between 6-8. Will give the patient Aranesp 268mcg-300mcg every 2-3 weeks. 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 Drug induced anemia by hydralazine has also been proposed as a contributing factor since her ANA titer increased with the use of this medication.  -Will transfuse if HgB<6 or if she becomes symptomatic  -Aranesp on Sept 1  Chronic Renal Failure: Recent baseline Cr in the 2.2-2.7 range. Cr today is 2.44. UOP has been good/adequate. Monitor UOP.  -May add ACEi if she continues to be hypertensive and renal function allows  DMII: Last Hg A1C 7.5% on 11/13/13. On Lantus 30 u qHS and Novolog 10-25 u actid at home. CBGs have been <200 since increasing lantus to 30u.  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 Secondary to Hepatitis C infection. She has no signs or symptoms of hepatic encephalopathy or SBP.  -Will consider diagnostic/therapeutic tap of ascitic fluid if she declines  -Continue home lactulose PO titrated to 3BM/day   History of seizure: Stable. Has a history of pseudoseizures with voluntary jerking movements while following commands and remaining fully  conversant. Will monitor and continue home seizure medications.  -Continue Vimpat  -Continue Keppra   HIV disease: Well controlled. Her viral load was rechecked and was undetectable on 01/14/14. Her CD4 count was also checked and had a count of 240. It was at 280 in April 2015.  -Continue home abacavir, prezista, norvir, and epivir  -Continue acyclovir for prophylaxis  Hepatitis C: Genotype in December 2014. Genotype 1b, viral load of 4276200, Log of 6.63.  -She will f/u with Dr. Drue Second in ID for possible treatment of Hep C   DVT prophylaxis: SCDs   Diet:  -Diet heart healthy/ Carb mod   Dispo: Discharge pending SNF placement.   This is a Psychologist, occupational Note.  The care of the patient was discussed with Dr. Garald Braver and the assessment and plan formulated with their assistance.  Please see their attached note for official documentation of the daily encounter.   LOS: 4 days   Lillia Carmel, Med Student 01/24/2014, 12:18 PM

## 2014-01-28 ENCOUNTER — Ambulatory Visit: Payer: Self-pay | Admitting: Internal Medicine

## 2014-02-03 ENCOUNTER — Inpatient Hospital Stay (HOSPITAL_COMMUNITY)
Admission: EM | Admit: 2014-02-03 | Discharge: 2014-02-07 | DRG: 974 | Disposition: A | Discharge status: Skilled Nursing Facility | Payer: Medicaid Other | Source: Other Acute Inpatient Hospital | Attending: Internal Medicine | Admitting: Internal Medicine

## 2014-02-03 ENCOUNTER — Inpatient Hospital Stay (HOSPITAL_COMMUNITY): Payer: Medicaid Other

## 2014-02-03 ENCOUNTER — Encounter (HOSPITAL_COMMUNITY): Payer: Self-pay | Admitting: Internal Medicine

## 2014-02-03 DIAGNOSIS — E119 Type 2 diabetes mellitus without complications: Secondary | ICD-10-CM

## 2014-02-03 DIAGNOSIS — L299 Pruritus, unspecified: Secondary | ICD-10-CM | POA: Diagnosis present

## 2014-02-03 DIAGNOSIS — I5033 Acute on chronic diastolic (congestive) heart failure: Secondary | ICD-10-CM | POA: Diagnosis present

## 2014-02-03 DIAGNOSIS — I872 Venous insufficiency (chronic) (peripheral): Secondary | ICD-10-CM

## 2014-02-03 DIAGNOSIS — G40909 Epilepsy, unspecified, not intractable, without status epilepticus: Secondary | ICD-10-CM

## 2014-02-03 DIAGNOSIS — J189 Pneumonia, unspecified organism: Secondary | ICD-10-CM | POA: Diagnosis present

## 2014-02-03 DIAGNOSIS — B1920 Unspecified viral hepatitis C without hepatic coma: Secondary | ICD-10-CM | POA: Diagnosis present

## 2014-02-03 DIAGNOSIS — E872 Acidosis, unspecified: Secondary | ICD-10-CM

## 2014-02-03 DIAGNOSIS — E8729 Other acidosis: Secondary | ICD-10-CM | POA: Diagnosis present

## 2014-02-03 DIAGNOSIS — E1129 Type 2 diabetes mellitus with other diabetic kidney complication: Secondary | ICD-10-CM

## 2014-02-03 DIAGNOSIS — N186 End stage renal disease: Secondary | ICD-10-CM | POA: Diagnosis present

## 2014-02-03 DIAGNOSIS — I129 Hypertensive chronic kidney disease with stage 1 through stage 4 chronic kidney disease, or unspecified chronic kidney disease: Secondary | ICD-10-CM

## 2014-02-03 DIAGNOSIS — B20 Human immunodeficiency virus [HIV] disease: Secondary | ICD-10-CM | POA: Diagnosis present

## 2014-02-03 DIAGNOSIS — R197 Diarrhea, unspecified: Secondary | ICD-10-CM

## 2014-02-03 DIAGNOSIS — R569 Unspecified convulsions: Secondary | ICD-10-CM

## 2014-02-03 DIAGNOSIS — R188 Other ascites: Secondary | ICD-10-CM | POA: Diagnosis present

## 2014-02-03 DIAGNOSIS — Z6834 Body mass index (BMI) 34.0-34.9, adult: Secondary | ICD-10-CM

## 2014-02-03 DIAGNOSIS — R0600 Dyspnea, unspecified: Secondary | ICD-10-CM

## 2014-02-03 DIAGNOSIS — E46 Unspecified protein-calorie malnutrition: Secondary | ICD-10-CM

## 2014-02-03 DIAGNOSIS — R0609 Other forms of dyspnea: Secondary | ICD-10-CM

## 2014-02-03 DIAGNOSIS — D638 Anemia in other chronic diseases classified elsewhere: Secondary | ICD-10-CM | POA: Diagnosis present

## 2014-02-03 DIAGNOSIS — A0472 Enterocolitis due to Clostridium difficile, not specified as recurrent: Secondary | ICD-10-CM

## 2014-02-03 DIAGNOSIS — K219 Gastro-esophageal reflux disease without esophagitis: Secondary | ICD-10-CM | POA: Diagnosis present

## 2014-02-03 DIAGNOSIS — K769 Liver disease, unspecified: Secondary | ICD-10-CM | POA: Diagnosis present

## 2014-02-03 DIAGNOSIS — R079 Chest pain, unspecified: Secondary | ICD-10-CM | POA: Diagnosis present

## 2014-02-03 DIAGNOSIS — I120 Hypertensive chronic kidney disease with stage 5 chronic kidney disease or end stage renal disease: Secondary | ICD-10-CM | POA: Diagnosis present

## 2014-02-03 DIAGNOSIS — N049 Nephrotic syndrome with unspecified morphologic changes: Secondary | ICD-10-CM

## 2014-02-03 DIAGNOSIS — I10 Essential (primary) hypertension: Secondary | ICD-10-CM

## 2014-02-03 DIAGNOSIS — R4182 Altered mental status, unspecified: Secondary | ICD-10-CM

## 2014-02-03 DIAGNOSIS — R7881 Bacteremia: Secondary | ICD-10-CM

## 2014-02-03 DIAGNOSIS — I509 Heart failure, unspecified: Secondary | ICD-10-CM | POA: Diagnosis present

## 2014-02-03 DIAGNOSIS — E1165 Type 2 diabetes mellitus with hyperglycemia: Secondary | ICD-10-CM

## 2014-02-03 DIAGNOSIS — I5031 Acute diastolic (congestive) heart failure: Secondary | ICD-10-CM | POA: Diagnosis present

## 2014-02-03 DIAGNOSIS — I272 Pulmonary hypertension, unspecified: Secondary | ICD-10-CM

## 2014-02-03 DIAGNOSIS — M79609 Pain in unspecified limb: Secondary | ICD-10-CM

## 2014-02-03 DIAGNOSIS — R635 Abnormal weight gain: Secondary | ICD-10-CM

## 2014-02-03 DIAGNOSIS — E43 Unspecified severe protein-calorie malnutrition: Secondary | ICD-10-CM | POA: Diagnosis present

## 2014-02-03 DIAGNOSIS — R0989 Other specified symptoms and signs involving the circulatory and respiratory systems: Secondary | ICD-10-CM

## 2014-02-03 DIAGNOSIS — N184 Chronic kidney disease, stage 4 (severe): Secondary | ICD-10-CM

## 2014-02-03 DIAGNOSIS — R410 Disorientation, unspecified: Secondary | ICD-10-CM | POA: Diagnosis present

## 2014-02-03 DIAGNOSIS — R748 Abnormal levels of other serum enzymes: Secondary | ICD-10-CM | POA: Diagnosis present

## 2014-02-03 LAB — CBC
HCT: 24.8 % — ABNORMAL LOW (ref 36.0–46.0)
Hemoglobin: 7.8 g/dL — ABNORMAL LOW (ref 12.0–15.0)
MCH: 32 pg (ref 26.0–34.0)
MCHC: 31.5 g/dL (ref 30.0–36.0)
MCV: 101.6 fL — ABNORMAL HIGH (ref 78.0–100.0)
Platelets: 185 10*3/uL (ref 150–400)
RBC: 2.44 MIL/uL — ABNORMAL LOW (ref 3.87–5.11)
RDW: 19.3 % — AB (ref 11.5–15.5)
WBC: 6.2 10*3/uL (ref 4.0–10.5)

## 2014-02-03 LAB — COMPREHENSIVE METABOLIC PANEL
ALBUMIN: 1.3 g/dL — AB (ref 3.5–5.2)
ALK PHOS: 520 U/L — AB (ref 39–117)
ALT: 8 U/L (ref 0–35)
ALT: 7 U/L (ref 0–35)
ANION GAP: 12 (ref 5–15)
AST: 47 U/L — ABNORMAL HIGH (ref 0–37)
AST: 41 U/L — ABNORMAL HIGH (ref 0–37)
Albumin: 1.5 g/dL — ABNORMAL LOW (ref 3.5–5.2)
Alkaline Phosphatase: 598 U/L — ABNORMAL HIGH (ref 39–117)
Anion gap: 13 (ref 5–15)
BILIRUBIN TOTAL: 2.1 mg/dL — AB (ref 0.3–1.2)
BUN: 23 mg/dL (ref 6–23)
BUN: 23 mg/dL (ref 6–23)
CALCIUM: 7.4 mg/dL — AB (ref 8.4–10.5)
CALCIUM: 7.4 mg/dL — AB (ref 8.4–10.5)
CO2: 15 meq/L — AB (ref 19–32)
CO2: 15 mEq/L — ABNORMAL LOW (ref 19–32)
Chloride: 111 mEq/L (ref 96–112)
Chloride: 111 mEq/L (ref 96–112)
Creatinine, Ser: 2.01 mg/dL — ABNORMAL HIGH (ref 0.50–1.10)
Creatinine, Ser: 2.02 mg/dL — ABNORMAL HIGH (ref 0.50–1.10)
GFR calc non Af Amer: 27 mL/min — ABNORMAL LOW (ref >90)
GFR calc non Af Amer: 27 mL/min — ABNORMAL LOW (ref >90)
GFR, EST AFRICAN AMERICAN: 31 mL/min — AB (ref >90)
GFR, EST AFRICAN AMERICAN: 31 mL/min — AB (ref >90)
Glucose, Bld: 206 mg/dL — ABNORMAL HIGH (ref 70–99)
Glucose, Bld: 291 mg/dL — ABNORMAL HIGH (ref 70–99)
POTASSIUM: 4.3 meq/L (ref 3.7–5.3)
Potassium: 4.2 mEq/L (ref 3.7–5.3)
SODIUM: 139 meq/L (ref 137–147)
Sodium: 138 mEq/L (ref 137–147)
Total Bilirubin: 2 mg/dL — ABNORMAL HIGH (ref 0.3–1.2)
Total Protein: 7.4 g/dL (ref 6.0–8.3)
Total Protein: 6.4 g/dL (ref 6.0–8.3)

## 2014-02-03 LAB — MRSA PCR SCREENING: MRSA by PCR: NEGATIVE

## 2014-02-03 LAB — CLOSTRIDIUM DIFFICILE BY PCR: CDIFFPCR: POSITIVE — AB

## 2014-02-03 LAB — GLUCOSE, CAPILLARY
GLUCOSE-CAPILLARY: 198 mg/dL — AB (ref 70–99)
GLUCOSE-CAPILLARY: 283 mg/dL — AB (ref 70–99)
GLUCOSE-CAPILLARY: 261 mg/dL — AB (ref 70–99)
Glucose-Capillary: 132 mg/dL — ABNORMAL HIGH (ref 70–99)

## 2014-02-03 LAB — TROPONIN I: Troponin I: <0.3 ng/mL (ref <0.30)

## 2014-02-03 LAB — AMMONIA: Ammonia: 70 umol/L — ABNORMAL HIGH (ref 11–60)

## 2014-02-03 LAB — LACTATE DEHYDROGENASE: LDH: 400 U/L — ABNORMAL HIGH (ref 94–250)

## 2014-02-03 MED ORDER — FUROSEMIDE 10 MG/ML IJ SOLN
80.0000 mg | Freq: Once | INTRAMUSCULAR | Status: AC
  Administered 2014-02-03: 80 mg via INTRAVENOUS
  Filled 2014-02-03: qty 8

## 2014-02-03 MED ORDER — RITONAVIR 100 MG PO CAPS
100.0000 mg | ORAL_CAPSULE | Freq: Every day | ORAL | Status: DC
  Administered 2014-02-03 – 2014-02-07 (×5): 100 mg via ORAL
  Filled 2014-02-03 (×6): qty 1

## 2014-02-03 MED ORDER — PIPERACILLIN-TAZOBACTAM 3.375 G IVPB
3.3750 g | Freq: Three times a day (TID) | INTRAVENOUS | Status: DC
  Administered 2014-02-03 – 2014-02-07 (×13): 3.375 g via INTRAVENOUS
  Filled 2014-02-03 (×18): qty 50

## 2014-02-03 MED ORDER — SODIUM CHLORIDE 0.9 % IJ SOLN
10.0000 mL | INTRAMUSCULAR | Status: DC | PRN
  Administered 2014-02-03 – 2014-02-04 (×2): 10 mL
  Administered 2014-02-07: 20 mL
  Administered 2014-02-07: 10 mL

## 2014-02-03 MED ORDER — LAMIVUDINE 10 MG/ML PO SOLN
100.0000 mg | Freq: Every day | ORAL | Status: DC
  Administered 2014-02-03: 100 mg via ORAL
  Filled 2014-02-03 (×2): qty 10

## 2014-02-03 MED ORDER — INSULIN ASPART 100 UNIT/ML ~~LOC~~ SOLN
0.0000 [IU] | Freq: Every day | SUBCUTANEOUS | Status: DC
  Administered 2014-02-05: 2 [IU] via SUBCUTANEOUS

## 2014-02-03 MED ORDER — AZITHROMYCIN 500 MG PO TABS
500.0000 mg | ORAL_TABLET | Freq: Every day | ORAL | Status: DC
  Administered 2014-02-03: 500 mg via ORAL
  Filled 2014-02-03: qty 1

## 2014-02-03 MED ORDER — HEPARIN SODIUM (PORCINE) 5000 UNIT/ML IJ SOLN
5000.0000 [IU] | Freq: Three times a day (TID) | INTRAMUSCULAR | Status: DC
  Administered 2014-02-03 – 2014-02-07 (×14): 5000 [IU] via SUBCUTANEOUS
  Filled 2014-02-03 (×16): qty 1

## 2014-02-03 MED ORDER — LACTULOSE 10 GM/15ML PO SOLN
20.0000 g | Freq: Three times a day (TID) | ORAL | Status: DC
  Administered 2014-02-03 – 2014-02-05 (×7): 20 g via ORAL
  Filled 2014-02-03 (×9): qty 30

## 2014-02-03 MED ORDER — HYDRALAZINE HCL 50 MG PO TABS
100.0000 mg | ORAL_TABLET | Freq: Three times a day (TID) | ORAL | Status: DC
  Administered 2014-02-03 – 2014-02-07 (×14): 100 mg via ORAL
  Filled 2014-02-03 (×17): qty 2

## 2014-02-03 MED ORDER — AZITHROMYCIN 500 MG PO TABS: 500.0000 mg | ORAL_TABLET | Freq: Every day | ORAL | Status: DC

## 2014-02-03 MED ORDER — VANCOMYCIN HCL 10 G IV SOLR
1250.0000 mg | INTRAVENOUS | Status: DC
  Administered 2014-02-04 – 2014-02-06 (×3): 1250 mg via INTRAVENOUS
  Filled 2014-02-03 (×5): qty 1250

## 2014-02-03 MED ORDER — ABACAVIR SULFATE 300 MG PO TABS
600.0000 mg | ORAL_TABLET | Freq: Every day | ORAL | Status: DC
  Administered 2014-02-03 – 2014-02-07 (×5): 600 mg via ORAL
  Filled 2014-02-03 (×5): qty 2

## 2014-02-03 MED ORDER — HYDRALAZINE HCL 50 MG PO TABS
100.0000 mg | ORAL_TABLET | Freq: Three times a day (TID) | ORAL | Status: DC
  Filled 2014-02-03 (×4): qty 2

## 2014-02-03 MED ORDER — DARUNAVIR ETHANOLATE 800 MG PO TABS
800.0000 mg | ORAL_TABLET | Freq: Every day | ORAL | Status: DC
  Administered 2014-02-03 – 2014-02-07 (×5): 800 mg via ORAL
  Filled 2014-02-03 (×6): qty 1

## 2014-02-03 MED ORDER — METRONIDAZOLE 500 MG PO TABS
500.0000 mg | ORAL_TABLET | Freq: Three times a day (TID) | ORAL | Status: DC
  Administered 2014-02-03 – 2014-02-07 (×13): 500 mg via ORAL
  Filled 2014-02-03 (×15): qty 1

## 2014-02-03 MED ORDER — ASPIRIN EC 81 MG PO TBEC
81.0000 mg | DELAYED_RELEASE_TABLET | Freq: Every day | ORAL | Status: DC
  Administered 2014-02-03 – 2014-02-07 (×5): 81 mg via ORAL
  Filled 2014-02-03 (×5): qty 1

## 2014-02-03 MED ORDER — SODIUM BICARBONATE 650 MG PO TABS
650.0000 mg | ORAL_TABLET | Freq: Two times a day (BID) | ORAL | Status: DC
  Administered 2014-02-03 – 2014-02-07 (×9): 650 mg via ORAL
  Filled 2014-02-03 (×10): qty 1

## 2014-02-03 MED ORDER — CLONIDINE HCL 0.3 MG/24HR TD PTWK: 0.3000 mg | MEDICATED_PATCH | TRANSDERMAL | Status: DC

## 2014-02-03 MED ORDER — SODIUM CHLORIDE 0.9 % IJ SOLN
3.0000 mL | Freq: Two times a day (BID) | INTRAMUSCULAR | Status: DC
  Administered 2014-02-03: 3 mL via INTRAVENOUS

## 2014-02-03 MED ORDER — VANCOMYCIN HCL IN DEXTROSE 750-5 MG/150ML-% IV SOLN: 750.0000 mg | INTRAVENOUS | Status: DC

## 2014-02-03 MED ORDER — HYDROCERIN EX CREA
1.0000 "application " | TOPICAL_CREAM | Freq: Two times a day (BID) | CUTANEOUS | Status: DC
  Administered 2014-02-03 – 2014-02-07 (×9): 1 via TOPICAL
  Filled 2014-02-03: qty 113

## 2014-02-03 MED ORDER — ACETAMINOPHEN 325 MG PO TABS
650.0000 mg | ORAL_TABLET | Freq: Four times a day (QID) | ORAL | Status: DC | PRN
  Administered 2014-02-05: 650 mg via ORAL
  Filled 2014-02-03: qty 2

## 2014-02-03 MED ORDER — LABETALOL HCL 300 MG PO TABS
600.0000 mg | ORAL_TABLET | Freq: Two times a day (BID) | ORAL | Status: DC
  Filled 2014-02-03 (×2): qty 2

## 2014-02-03 MED ORDER — VANCOMYCIN HCL 10 G IV SOLR
1500.0000 mg | Freq: Once | INTRAVENOUS | Status: AC
  Administered 2014-02-03: 1500 mg via INTRAVENOUS
  Filled 2014-02-03: qty 1500

## 2014-02-03 MED ORDER — HYDROXYZINE HCL 10 MG PO TABS
10.0000 mg | ORAL_TABLET | Freq: Three times a day (TID) | ORAL | Status: DC | PRN
  Administered 2014-02-03 (×2): 10 mg via ORAL
  Filled 2014-02-03 (×3): qty 1

## 2014-02-03 MED ORDER — DIPHENHYDRAMINE HCL 25 MG PO CAPS: 25.0000 mg | ORAL_CAPSULE | ORAL | Status: DC | PRN

## 2014-02-03 MED ORDER — INSULIN ASPART 100 UNIT/ML ~~LOC~~ SOLN
0.0000 [IU] | Freq: Three times a day (TID) | SUBCUTANEOUS | Status: DC
  Administered 2014-02-03 (×2): 8 [IU] via SUBCUTANEOUS
  Administered 2014-02-03 – 2014-02-06 (×8): 3 [IU] via SUBCUTANEOUS
  Administered 2014-02-06 (×2): 5 [IU] via SUBCUTANEOUS
  Administered 2014-02-07: 2 [IU] via SUBCUTANEOUS
  Administered 2014-02-07: 3 [IU] via SUBCUTANEOUS

## 2014-02-03 MED ORDER — LACOSAMIDE 50 MG PO TABS
150.0000 mg | ORAL_TABLET | Freq: Two times a day (BID) | ORAL | Status: DC
  Administered 2014-02-03 – 2014-02-07 (×9): 150 mg via ORAL
  Filled 2014-02-03 (×10): qty 3

## 2014-02-03 MED ORDER — PANTOPRAZOLE SODIUM 40 MG PO TBEC
40.0000 mg | DELAYED_RELEASE_TABLET | Freq: Every day | ORAL | Status: DC
  Administered 2014-02-03 – 2014-02-07 (×5): 40 mg via ORAL
  Filled 2014-02-03 (×5): qty 1

## 2014-02-03 MED ORDER — ACYCLOVIR 800 MG PO TABS
800.0000 mg | ORAL_TABLET | Freq: Every day | ORAL | Status: DC
  Administered 2014-02-03 – 2014-02-07 (×5): 800 mg via ORAL
  Filled 2014-02-03 (×5): qty 1

## 2014-02-03 MED ORDER — LABETALOL HCL 300 MG PO TABS
600.0000 mg | ORAL_TABLET | Freq: Two times a day (BID) | ORAL | Status: DC
  Administered 2014-02-03 – 2014-02-04 (×3): 600 mg via ORAL
  Filled 2014-02-03 (×4): qty 2

## 2014-02-03 MED ORDER — LEVETIRACETAM 750 MG PO TABS
1500.0000 mg | ORAL_TABLET | Freq: Two times a day (BID) | ORAL | Status: DC
  Administered 2014-02-03 – 2014-02-07 (×9): 1500 mg via ORAL
  Filled 2014-02-03 (×10): qty 2

## 2014-02-03 NOTE — Progress Notes (Signed)
Bedside RN notified of BP 250/130.  Bedside RN already aware and had contacted MD about elevated pressures.  No new orders at this time. Elink RN will continue to monitor.

## 2014-02-03 NOTE — H&P (Signed)
INTERNAL MEDICINE TEACHING ATTENDING NOTE  Day 0 of stay  [name] name: Michelle [name]  MRN: [id number] Date of birth: [birth year]   Key clinical points and exam                                                          53 y.o.african [place] female with history of diastolic congestive heart failure, end-stage liver disease, HIV disease, stage IV chronic kidney disease, type 2 diabetes, anemia chronic disease, and seizure disorder admitted for shortness of breath and chest pain. Recently admitted with HCAP - discharged on 01/24/14. She [facility] went to [facility] from where she was transferred to [facility] ER.   I met the [name] this morning. She appeared alert, oriented, slightly sleepy but easily arousable to verbal name cues. She said she was exhausted because she had not slept the night and had 12 bowel movements since she came to the hospital. (I do not see a nurses note to corroboroate that, but I do see that a cdiff test has been sent by the nurse).   Vitals Filed Vitals:   02/03/14 0500 02/03/14 0600 02/03/14 0800 02/03/14 0850  BP:  249/110  213/103  Pulse:  84  112  Temp: 97.7 F (36.5 C)  98 F (36.7 C)   TempSrc: Oral  Oral   Resp:  19    Height: _0  (1.651 m)     Weight: 206 lb 12.7 oz (93.8 kg)     SpO2:  100%      General: Resting in bed. Alert and oriented, drowsy but easily arousable. No acute distress, but sick looking [name].  HEENT: PERRL, EOMI, no scleral icterus. Heart: RRR, no rubs, murmurs or gallops. Chest: Right IJ - no abnormality in the surrounding area. Lungs: Rale all lung fields.  Abdomen: Soft, mild tenderness over umbilical and suprapubic area, distended. BS present. Extremities: Warm, mild 1+ pedal edema. No asterixis present. Tender calves bilateral, no chords.  Neuro: Alert and oriented X3, cranial nerves II-XII grossly intact,  strength and sensation to light touch equal in bilateral upper and lower extremities  I have reviewed the  chart, lab results, EKG, imaging and relevant notes of this [name]. Outside hospital lab results also reviewed.   Assessment and Plan                                                                       Subjective shortness of breath in the setting of HIV, presumed HCAP - The [name] does not have any objective desaturation. Her CXR shows picture consistent with increasing consolidation on the right lung base. Vancomycin and Zocyn has been started, we will continue that. She had MSSA HCAP last admission, however she has been positive for MRSA before. She does have multiple risk factors for MRSA - antibiotic use (levofloxacin especially), HIV, we will keep her on Vancomycin until we get culture sensitivity. LDH is elevated, PJP in differential, however it was elevated all last admissions as well.   Cirrhosis - ? Ascitis - mild distension, not  sure about fluid thrill on exam. May be small ascitis. Mildly tender belly centrally and below. USG abdomen with diagnostic paracentesis if ascitis is present. INR is 1. Elevated transaminases - HCV, cirrhosis. She is covered for SBP in any case with Zocyn.  Diarrhea - [name] is Cdiff positive. Po flagyl to start. We will repeat BMP, and ammonia.   Mild pedal edema with calf tenderness - We will do LE dopplers.    Hypertension - Home medications to start.   Drowsiness - likely due to generalized exhaustion. But we will recheck ammonia levels.  Question of CHF exacerbation - Outside hospital BNP > 7000. One dose of lasix has been given. Urine output not yet charted. I will wait for the [name]'s labs to return and confirm her IOs before deciding on further treating her with lasix. She does not have significant dysfunction on recent Echo dysfunction. ProBnp can be falsely elevated with renal disease, and it cannot be used to assess HF in CKD patients. I will hold any more lasix in the setting of diarrhea.    I have seen and evaluated this [name] and  discussed it with my IM resident team - Dr Michelle [name], Dr Michelle [name] and Dr Michelle [name]. Tomorrow, the [name]'s care will be taken over by Dr Michelle [name].   Please see the rest of the plan per resident note from today.   [place], [place] 02/03/2014, 10:38 AM.

## 2014-02-03 NOTE — Progress Notes (Signed)
ANTIBIOTIC CONSULT NOTE - INITIAL  Pharmacy Consult for Vancomycin and Zosyn  Indication: rule out pneumonia  Allergies  Allergen Reactions  . 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 Morphine And Related Hives, Itching and Rash  . Norvasc [Amlodipine Besylate] Hives, Itching and Rash    Patient Measurements: Height:  (165.1 cm) Weight: 206 lb 12.7 oz (93.8 kg) IBW/kg (Calculated) : 57 Adjusted Body Weight: 70 kg  Vital Signs: Temp: 97.7 F (36.5 C) (09/07 0500) Temp src: Oral (09/07 0500) BP: 249/110 mmHg (09/07 0600) Pulse Rate: 84 (09/07 0600) Intake/Output from previous day:   Intake/Output from this shift:    Labs (at [facility]): WBC  8.4 Hgb  8.0 Hct  24.9 Plt  194  SCr  2.1  No results found for this basename: WBC, HGB, PLT, LABCREA, CREATININE,  in the last 72 hours Estimated Creatinine Clearance: 33.7 ml/min (by C-G formula based on Cr of 2.16). No results found for this basename: VANCOTROUGH, Leodis Binet, VANCORANDOM, GENTTROUGH, GENTPEAK, GENTRANDOM, TOBRATROUGH, TOBRAPEAK, TOBRARND, AMIKACINPEAK, AMIKACINTROU, AMIKACIN,  in the last 72 hours   Microbiology: Recent Results (from the past 720 hour(s))  CULTURE, BLOOD (ROUTINE X 2)     Status: None   Collection Time    01/05/14  3:30 PM      Result Value Ref Range Status   Specimen Description BLOOD LEFT ARM   Final   Special Requests     Final   Value: BOTTLES DRAWN AEROBIC AND ANAEROBIC 10CC BLUE, 5CC RED   Culture  Setup Time     Final   Value: 01/05/2014 22:48     Performed at [facility]   Culture     Final   Value: STAPHYLOCOCCUS AUREUS     Note: RIFAMPIN AND GENTAMICIN SHOULD NOT BE USED AS SINGLE DRUGS FOR TREATMENT OF STAPH INFECTIONS.     Note: Gram Stain Report Called to,Read Back By and Verified With: Volanda Napoleon RN on 01/06/14 at 06:45 by Christie Nottingham     Performed at [facility]   Report  Status 01/08/2014 FINAL   Final   Organism ID, Bacteria STAPHYLOCOCCUS AUREUS   Final  CULTURE, BLOOD (ROUTINE X 2)     Status: None   Collection Time    01/05/14  3:42 PM      Result Value Ref Range Status   Specimen Description BLOOD BLOOD LEFT FOREARM   Final   Special Requests BOTTLES DRAWN AEROBIC ONLY 5CC   Final   Culture  Setup Time     Final   Value: 01/05/2014 22:48     Performed at [facility]   Culture     Final   Value: STAPHYLOCOCCUS AUREUS     Note: SUSCEPTIBILITIES PERFORMED ON PREVIOUS CULTURE WITHIN THE LAST 5 DAYS.     Note: Gram Stain Report Called to,Read Back By and Verified With: Volanda Napoleon RN on 01/06/14 at 06:45 by Christie Nottingham     Performed at [facility]   Report Status 01/08/2014 FINAL   Final  CULTURE, BLOOD (ROUTINE X 2)     Status: None   Collection Time    01/06/14  3:35 PM      Result Value Ref Range Status   Specimen Description BLOOD LEFT ANTECUBITAL   Final   Special Requests     Final   Value: BOTTLES DRAWN AEROBIC AND ANAEROBIC 10CC AER 5CC ANA  Culture  Setup Time     Final   Value: 01/06/2014 19:02     Performed at [facility]   Culture     Final   Value: STAPHYLOCOCCUS AUREUS     Note: RIFAMPIN AND GENTAMICIN SHOULD NOT BE USED AS SINGLE DRUGS FOR TREATMENT OF STAPH INFECTIONS.     Note: Gram Stain Report Called to,Read Back By and Verified With: Junius Argyle 01/08/14 AT 0100 RIDK     Performed at [facility]   Report Status 01/10/2014 FINAL   Final   Organism ID, Bacteria STAPHYLOCOCCUS AUREUS   Final  CULTURE, BLOOD (ROUTINE X 2)     Status: None   Collection Time    01/06/14  3:45 PM      Result Value Ref Range Status   Specimen Description BLOOD LEFT HAND   Final   Special Requests     Final   Value: BOTTLES DRAWN AEROBIC AND ANAEROBIC 10CC AER 5CC ANA   Culture  Setup Time     Final   Value: 01/06/2014 19:00     Performed at [facility]   Culture     Final   Value: NO GROWTH 5  DAYS     Performed at [facility]   Report Status 01/13/2014 FINAL   Final  CULTURE, BLOOD (ROUTINE X 2)     Status: None   Collection Time    01/08/14  6:56 AM      Result Value Ref Range Status   Specimen Description BLOOD LEFT HAND   Final   Special Requests BOTTLES DRAWN AEROBIC AND ANAEROBIC 10CC   Final   Culture  Setup Time     Final   Value: 01/08/2014 10:12     Performed at [facility]   Culture     Final   Value: NO GROWTH 5 DAYS     Performed at [facility]   Report Status 01/14/2014 FINAL   Final  CULTURE, BLOOD (ROUTINE X 2)     Status: None   Collection Time    01/08/14  7:05 AM      Result Value Ref Range Status   Specimen Description BLOOD BLOOD LEFT FOREARM   Final   Special Requests BOTTLES DRAWN AEROBIC ONLY [facility]   Final   Culture  Setup Time     Final   Value: 01/08/2014 10:12     Performed at [facility]   Culture     Final   Value: NO GROWTH 5 DAYS     Performed at [facility]   Report Status 01/14/2014 FINAL   Final  CULTURE, BLOOD (ROUTINE X 2)     Status: None   Collection Time    01/10/14 12:47 AM      Result Value Ref Range Status   Specimen Description BLOOD LEFT FOREARM   Final   Special Requests     Final   Value: BOTTLES DRAWN AEROBIC AND ANAEROBIC BLUE 10 CC RED 5 CC   Culture  Setup Time     Final   Value: 01/11/2014 13:10     Performed at [facility]   Culture     Final   Value: NO GROWTH 5 DAYS     Performed at [facility]   Report Status 01/17/2014 FINAL   Final  CULTURE, BLOOD (ROUTINE X 2)     Status: None   Collection Time    01/10/14 12:53  AM      Result Value Ref Range Status   Specimen Description BLOOD LEFT HAND   Final   Special Requests BOTTLES DRAWN AEROBIC AND ANAEROBIC 10 CC   Final   Culture  Setup Time     Final   Value: 01/11/2014 13:10     Performed at [facility]   Culture     Final   Value: NO GROWTH 5 DAYS     Performed at [facility]   Report Status 01/17/2014 FINAL   Final  MRSA PCR SCREENING     Status: None   Collection Time    01/20/14  5:28 PM      Result Value Ref Range Status   MRSA by PCR NEGATIVE  NEGATIVE Final   Comment:            The GeneXpert MRSA Assay (FDA     approved for NASAL specimens     only), is one component of a     comprehensive MRSA colonization     surveillance program. It is not     intended to diagnose MRSA     infection nor to guide or     monitor treatment for     MRSA infections.  MRSA PCR SCREENING     Status: None   Collection Time    02/03/14  3:24 AM      Result Value Ref Range Status   MRSA by PCR NEGATIVE  NEGATIVE Final   Comment:            The GeneXpert MRSA Assay (FDA     approved for NASAL specimens     only), is one component of a     comprehensive MRSA colonization     surveillance program. It is not     intended to diagnose MRSA     infection nor to guide or     monitor treatment for     MRSA infections.    Medical History: Past Medical History  Diagnosis Date  . Meningitis   . HIV (human immunodeficiency virus infection) 1981  . Hypertension   . Gout   . Muscle spasms of head and/or neck   . Hypertensive encephalopathy ~ 05/2013    hospitalaized/notes 06/18/2013  . Anxiety     [name] 06/18/2013  . History of syphilis   . High cholesterol   . Cirrhosis     hepatitis C   . GERD (gastroesophageal reflux disease)   . Psychosis   . Chronic venous stasis dermatitis of both lower extremities   . CRD (chronic renal disease), stage IV   . Nephrotic syndrome   . CHF (congestive heart failure)     "one time" (01/22/2014)  . Myocardial infarction 2015    "ealy part of this year"  . End stage liver disease   . AIHA (autoimmune hemolytic anemia)     [name] 06/18/2013  . Anemia   . HCAP (healthcare-associated pneumonia) 12/2013  . Exertional shortness of breath   . Type II diabetes mellitus   . History of blood transfusion 12/2013 X ?    "I've  had a few recently" (01/22/2014)  . Hepatitis C     pt denies this hx on 01/22/2014  . Migraine     "all the time" (01/22/2014)  . Seizures     "last sz was 10/2013" (01/22/2014)  . Stroke     "I've had a few; I've recovered from all of them; had  one earlier this year too" (01/22/2014)  . Arthritis     "all over; comes and goes" (01/22/2014)  . Chronic back pain     "on and off" (01/22/2014)    Medications:  Abacavir  Acyclovir  ASA  Clonidine  Aranesp  Darunavir  Atarax  Novolog  Labetalol  Vimpat  Lactulose  Lamivudine  Keppra  Prilosec  Ritonovir  Sodium Bicarbonate    Assessment: 53 yo female with SOB, possible PNA, for empiric antibiotics  Goal of Therapy:  Vancomycin trough level 15-20 mcg/ml  Plan:  Vancomycin 1500 mg IV now, then 750 mg IV q24h Zosyn 3.375 g IV q8h   Eddie Candle 02/03/2014,6:36 AM

## 2014-02-03 NOTE — Progress Notes (Signed)
Subjective: No acute events overnight. Reports her shortness of breath has improved. She had chills last night. A little chest pain last night as well. Denies nausea/vomiting.  Objective: Vital signs in last 24 hours: Filed Vitals:   02/03/14 0500 02/03/14 0600 02/03/14 0800 02/03/14 0850  BP:  249/110  213/103  Pulse:  84  112  Temp: 97.7 F (36.5 C)  98 F (36.7 C)   TempSrc: Oral  Oral   Resp:  19    Height:  (1.651 m)     Weight: 206 lb 12.7 oz (93.8 kg)     SpO2:  100%     BP 175/89 :30AM, 146/103   Weight change:  No intake or output data in the 24 hours ending 02/03/14 0921 General: sleeping in bed  HEENT: PERRL, EOMI, scleral icterus  Cardiac: RRR, no rubs, murmurs or gallops Chest: right IJ tunneled PICC line in right upper chest, clean dry and intact without tenderness or erythema  Pulm: bilateral diffuse rales and rhonchi  Abd: soft, non-tender, BS present, distended with fluid wave  Ext: warm and well perfused, trace bilateral edema  Neuro: Awakes to voice, oriented X3, cranial nerves II-XII grossly intact, oriented to self, time, place  Lab Results: Basic Metabolic Panel:  Recent Labs Lab 02/03/14 0625  NA 139  K 4.2  CL 111  CO2 15*  GLUCOSE 206*  BUN 23  CREATININE 2.01*  CALCIUM 7.4*   Liver Function Tests:  Recent Labs Lab 02/03/14 0625  AST 47*  ALT 8  ALKPHOS 598*  BILITOT 2.1*  PROT 7.4  ALBUMIN 1.5*   CBC: OSH WBC 8.4  Hemoglobin 8.0  Hematocrit 24.9  MCV 102  Platelets 194  Recent Labs Lab 02/03/14 0625  WBC 6.2  HGB 7.8*  HCT 24.8*  MCV 101.6*  PLT 185   Cardiac Enzymes:  Recent Labs Lab 02/03/14 0625  TROPONINI <0.30   BNP: OSH: ProBNP 7540 (highest prior 1277 on 10/04/2013)  CBG:  Recent Labs Lab 02/03/14 0812  GLUCAP 198*   Coagulation: OSH: INR 1  Urinalysis: OSH: Few bacteria  WBC 5 to 10  Misc. Labs: LDH 02/03/2014 400  Micro Results: Recent Results (from the past 240  hour(s))  MRSA PCR SCREENING     Status: None   Collection Time    02/03/14  3:24 AM      Result Value Ref Range Status   MRSA by PCR NEGATIVE  NEGATIVE Final   Comment:            The GeneXpert MRSA Assay (FDA     approved for NASAL specimens     only), is one component of a     comprehensive MRSA colonization     surveillance program. It is not     intended to diagnose MRSA     infection nor to guide or     monitor treatment for     MRSA infections.   Studies/Results: Dg Chest 2 View  02/03/2014   CLINICAL DATA:  Dyspnea.  Recent history of HC AP  EXAM: CHEST  2 VIEW  COMPARISON:  02/02/2014  FINDINGS: Shallow inspiration with elevation of the right hemidiaphragm. Increasing consolidation or atelectasis in the right lung base with persistent patchy airspace disease in the left mid and upper lung. Probable small right pleural effusion. No pneumothorax. Right central venous catheter is unchanged in position. Normal heart size and pulmonary vascularity.  IMPRESSION: Increasing consolidation and effusion in the right lung  base with persistent focal infiltration in the left mid/ upper lung. Changes likely represent multifocal pneumonia.   Electronically Signed   By: Burman Nieves M.D.   On: 02/03/2014 05:54   Medications: I have reviewed the patient's current medications. Scheduled Meds: . abacavir  600 mg Oral Daily  . acyclovir  800 mg Oral Daily  . aspirin EC  81 mg Oral Daily  . azithromycin  500 mg Oral Daily  . [START ON 02/07/2014] cloNIDine  0.3 mg Transdermal Q Fri  . Darunavir Ethanolate  800 mg Oral Q breakfast  . heparin  5,000 Units Subcutaneous 3 times per day  . hydrALAZINE  100 mg Oral 3 times per day  . hydrocerin  1 application Topical BID  . insulin aspart  0-15 Units Subcutaneous TID WC  . insulin aspart  0-5 Units Subcutaneous QHS  . labetalol  600 mg Oral BID  . lacosamide  150 mg Oral BID  . lactulose  20 g Oral TID  . lamiVUDine  100 mg Oral Daily  .  levETIRAcetam  1,500 mg Oral BID  . pantoprazole  40 mg Oral Daily  . piperacillin-tazobactam (ZOSYN)  IV  3.375 g Intravenous 3 times per day  . ritonavir  100 mg Oral Q breakfast  . sodium bicarbonate  650 mg Oral BID  . sodium chloride  3 mL Intravenous Q12H  . [START ON 02/04/2014] vancomycin  1,250 mg Intravenous Q24H  . vancomycin  1,500 mg Intravenous Once   Continuous Infusions:  PRN Meds:.acetaminophen, hydrOXYzine, sodium chloride Assessment/Plan: Principal Problem:   Dyspnea Active Problems:   Unspecified essential hypertension   HIV disease   CKD (chronic kidney disease) stage 4, GFR 15-29 ml/min   DM2 (diabetes mellitus, type 2)   Anemia of chronic disease   Pruritus   Intermittent confusion   Acute diastolic congestive heart failure   Metabolic acidosis, increased anion gap   Elevated alkaline phosphatase level   Ascites   HCAP (healthcare-associated pneumonia)  Dyspnea: Elevated BNP of 7540 with the highest prior 1277 on May 2015. Up to 206 pounds (190 on January 20, 2014.) Patient presented with marked hypertension. Last echo August 2015 showed ejection fraction of 60-65% and grade 1 diastolic dysfunction. Outside CXR with comparison from 01/19/2014 noted a mildly increased left upper lobe opacity suspicious for pneumonia. CXR 2 view done here 02/03/2014 shows increasing consolidation and effusion in the right lung base with persistent focal infiltration in the left mid/ upper lung, likely representing multifocal pneumonia. Patient is afebrile and there is no leukocytosis, satting well on room air. Acute on chronic diastolic dysfunction congestive heart versus HCAP (HCAP risk factors include being hospitalized for 2 or more days within the past 90 days (discharged on 8/28), being a resident of a nursing home, and being recipient of recent intravenous antibiotic therapy) Received Lasix 80mg  x 1 02/03/2014. At last hospitalization was treated for HCAP with IV vanc and zosyn x1 day  and switched to IV Ancef (continuation of 4 week treatment for MSSA bacteremia ending 9/7) and given Levaquin x 3 days. - monitor urine output, daily weight - blood cultures pending  - continue IV vancomycin, zosyn. D/c azithromycin - sputum culture pending  - LE venous duplex  C Diff: positive on PCR, no past C diff in chart -Flagyl PO 500mg  TID  Hypertension: improved this AM after receiving first doses of home regimen. 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 Ammonia level 33 prior to transfer. - hydroxyzine  TID prn itching - continue home lactulose 20 g 3 times a day  - repeat ammonia - US abdomen  Chest pain: troponin negative x 2, EKG with T wave inversion in V1, unchanged from previous otherwise -continue to monitor  Non anion gap metabolic acidosis: Anion gap of 14 with CO2 of 14. Lactic acid 1.2 at OSH. Chronic, likely due to chronic kidney disease.   - Continue to monitor   Anemia of chronic disease: Hemoglobin 8.0, close to her baseline.  - Continue to monitor   Type 2 diabetes mellitus: Glucose at outside hospital 245. Last A1c was 7.5, 3 months ago.  - sliding scale insulin, moderate.   Chronic kidney disease stage IV: Creatinine 2.1, at prior baseline.  - continue home sodium bicarbonate 650 mg twice a day   HIV (CD4: 240, VL <20 In August 2015)  - continue home lamivudine, ritonavir, darunavir, and abacavir  - continue acyclovir for prophylaxis   Seizure disorder  - Continue home Keppra and lacosamide.   Dispo: Disposition is deferred at this time, awaiting improvement of current medical problems.  Anticipated discharge in approximately 1 day(s).   The patient does have a  current PCP Christen Bame, MD) and does need an [facility] hospital follow-up appointment after discharge.  The patient does not have transportation limitations that hinder transportation to clinic appointments.  .Services Needed at time of discharge: Y = Yes, Blank = No PT:   OT:   RN:   Equipment:   Other:     LOS: 0 days   Griffin Basil, MD 02/03/2014, 9:21 AM

## 2014-02-03 NOTE — Progress Notes (Signed)
Pt BP 249/110. MD aware. Will continue to monitor pt.

## 2014-02-03 NOTE — H&P (Signed)
Date: 02/03/2014               Patient Name:  Michelle [name] MRN: [id number]  DOB: 1960/11/23 Age / Sex: 53 y.o., female   PCP: Christen Bame, MD         Medical Service: Internal Medicine Teaching Service         Attending Physician: Dr. Aletta Edouard, MD    First Contact: Dr. Griffin Basil Pager: [phone]  Second Contact: Dr. Desma Maxim Pager: [phone]       After Hours (After 5p/  First Contact Pager: [phone]  weekends / holidays): Second Contact Pager: [phone]   Chief Complaint: shortness of breath and chest pain  History of Present Illness:   Patient is a 53 year old female with a history of diastolic congestive heart failure, end-stage liver disease, HIV disease, stage IV chronic kidney disease, type 2 diabetes, anemia chronic disease, and seizures who presents with reported shortness of breath and chest pain.   Patient was recently admitted to the internal medicine service between 01/20/2014 and 01/24/2014 for HCAP with acute hypoxic respiratory failure. After a positive for MSSA blood culture, patient was switched from IV vancomycin and Zosyn to IV Ancef (scheduled to finish 02/03/2014) along with a course of Levaquin (scheduled to finish 01/23/2014).   IMTS called by Jeanmarie Hubert of [facility] who informed that patient had been sent from her rehabilitation facility due to shortness of breath and chest pain. He felt that she should be admitted, however, internal medicine at [facility] did not feel prepared given her HIV status and CKD and requested a hospital to hospital transfer since she receives her care here and was recently discharged.  Patient lacks insight into reason for hospitalization and is unable to provide elements of the history of present illness herself. Patient denies any fevers, chills, dyspnea, chest pain, abdominal pain, or dysuria upon exam. Patient does endorse general pruritus and some suprapubic tightness. No fevers reported by her nursing  home.  Meds:  Prescriptions prior to admission  Medication Sig Dispense Refill  . abacavir (ZIAGEN) 300 MG tablet Take 600 mg by mouth daily.       [name] acetaminophen (TYLENOL) 325 MG tablet Take 650 mg by mouth every 6 (six) hours as needed (Fever).      [name] acyclovir (ZOVIRAX) 800 MG tablet Take 800 mg by mouth daily.       [name] aspirin EC 81 MG tablet Take 81 mg by mouth daily.      [name] ceFAZolin (ANCEF) 1-5 GM-% Inject 50 mLs (1 g total) into the vein every 12 (twelve) hours. To end on September 7th  50 mL  0  . cloNIDine (CATAPRES - DOSED IN MG/24 HR) 0.3 mg/24hr patch Place 1 patch (0.3 mg total) onto the skin once a week.  4 patch  12  . darbepoetin (ARANESP) 200 MCG/0.4ML SOLN injection Inject 0.4 mLs (200 mcg total) into the skin every 14 (fourteen) days.  1.68 mL    . Darunavir Ethanolate (PREZISTA) 800 MG tablet Take 800 mg by mouth daily with breakfast.      . hydrALAZINE (APRESOLINE) 100 MG tablet Take 1 tablet (100 mg total) by mouth every 8 (eight) hours.  90 tablet  3  . hydrocerin (EUCERIN) CREA Apply 1 application topically 2 (two) times daily.  113 g  0  . hydrOXYzine (ATARAX/VISTARIL) 10 MG tablet Take 1 tablet (10 mg total) by mouth 3 (three) times daily as needed for itching.  30 tablet  0  . insulin aspart (NOVOLOG) 100 UNIT/ML injection Inject 10-25 Units into the skin 3 (three) times daily before meals. Based on sliding scale      . labetalol (NORMODYNE) 300 MG tablet Take 600 mg by mouth 2 (two) times daily.      [name] lacosamide 150 MG TABS Take 1 tablet (150 mg total) by mouth 2 (two) times daily.  60 tablet    . lactulose (CHRONULAC) 10 GM/15ML solution Take 30 mLs (20 g total) by mouth 3 (three) times daily.  240 mL  0  . lamiVUDine (EPIVIR) 10 MG/ML solution Take 10 mLs (100 mg total) by mouth daily.  240 mL  12  . levETIRAcetam (KEPPRA) 500 MG tablet Take 1,500 mg by mouth 2 (two) times daily.      [name] omeprazole (PRILOSEC) 20 MG capsule Take 20 mg by mouth daily before  breakfast.      . ritonavir (NORVIR) 100 MG capsule Take 100 mg by mouth daily with breakfast.      . sodium bicarbonate 650 MG tablet Take 650 mg by mouth 2 (two) times daily.        Current Facility-Administered Medications  Medication Dose Route Frequency Provider Last Rate Last Dose  . abacavir (ZIAGEN) tablet 600 mg  600 mg Oral Daily Gust Rung, DO      . acetaminophen (TYLENOL) tablet 650 mg  650 mg Oral Q6H PRN Gust Rung, DO      . acyclovir (ZOVIRAX) tablet 800 mg  800 mg Oral Daily Gust Rung, DO      . aspirin EC tablet 81 mg  81 mg Oral Daily Gust Rung, DO      . [START ON 02/07/2014] cloNIDine (CATAPRES - Dosed in mg/24 hr) patch 0.3 mg  0.3 mg Transdermal Q Fri Gust Rung, DO      . Darunavir Ethanolate (PREZISTA) tablet 800 mg  800 mg Oral Q breakfast Gust Rung, DO      . heparin injection 5,000 Units  5,000 Units Subcutaneous 3 times per day Gust Rung, DO   5,000 Units at 02/03/14 0606  . hydrALAZINE (APRESOLINE) tablet 100 mg  100 mg Oral 3 times per day Gust Rung, DO      . hydrocerin (EUCERIN) cream 1 application  1 application Topical BID Gust Rung, DO      . hydrOXYzine (ATARAX/VISTARIL) tablet 10 mg  10 mg Oral TID PRN Gust Rung, DO   10 mg at 02/03/14 0526  . insulin aspart (novoLOG) injection 0-15 Units  0-15 Units Subcutaneous TID WC Harold Barban, MD      . insulin aspart (novoLOG) injection 0-5 Units  0-5 Units Subcutaneous QHS Harold Barban, MD      . labetalol (NORMODYNE) tablet 600 mg  600 mg Oral BID Gust Rung, DO      . lacosamide (VIMPAT) tablet 150 mg  150 mg Oral BID Gust Rung, DO      . lactulose (CHRONULAC) 10 GM/15ML solution 20 g  20 g Oral TID Gust Rung, DO      . lamiVUDine (EPIVIR) 10 MG/ML solution 100 mg  100 mg Oral Daily Gust Rung, DO      . levETIRAcetam (KEPPRA) tablet 1,500 mg  1,500 mg Oral BID Gust Rung, DO      . pantoprazole (PROTONIX) EC tablet 40 mg  40 mg Oral Daily  Marthenia Rolling  Hoffman, DO      . ritonavir (NORVIR) capsule 100 mg  100 mg Oral Q breakfast Gust Rung, DO      . sodium bicarbonate tablet 650 mg  650 mg Oral BID Gust Rung, DO      . sodium chloride 0.9 % injection 10-40 mL  10-40 mL Intracatheter PRN Aletta Edouard, MD      . sodium chloride 0.9 % injection 3 mL  3 mL Intravenous Q12H Gust Rung, DO        Allergies: Allergies as of 02/03/2014 - Review Complete 02/03/2014  Allergen Reaction Noted  . Ceftriaxone Other (See Comments) 06/01/2013  . Morphine and related Hives, Itching, and Rash 02/15/2013  . Norvasc [amlodipine besylate] Hives, Itching, and Rash 03/30/2013   Past Medical History  Diagnosis Date  . Meningitis   . HIV (human immunodeficiency virus infection) 1981  . Hypertension   . Gout   . Muscle spasms of head and/or neck   . Hypertensive encephalopathy ~ 05/2013    hospitalaized/notes 06/18/2013  . Anxiety     [name] 06/18/2013  . History of syphilis   . High cholesterol   . Cirrhosis     hepatitis C   . GERD (gastroesophageal reflux disease)   . Psychosis   . Chronic venous stasis dermatitis of both lower extremities   . CRD (chronic renal disease), stage IV   . Nephrotic syndrome   . CHF (congestive heart failure)     "one time" (01/22/2014)  . Myocardial infarction 2015    "ealy part of this year"  . End stage liver disease   . AIHA (autoimmune hemolytic anemia)     [name] 06/18/2013  . Anemia   . HCAP (healthcare-associated pneumonia) 12/2013  . Exertional shortness of breath   . Type II diabetes mellitus   . History of blood transfusion 12/2013 X ?    "I've had a few recently" (01/22/2014)  . Hepatitis C     pt denies this hx on 01/22/2014  . Migraine     "all the time" (01/22/2014)  . Seizures     "last sz was 10/2013" (01/22/2014)  . Stroke     "I've had a few; I've recovered from all of them; had one earlier this year too" (01/22/2014)  . Arthritis     "all over; comes and goes" (01/22/2014)    . Chronic back pain     "on and off" (01/22/2014)   Past Surgical History  Procedure Laterality Date  . Hip pinning Right 1980's  . Av fistula placement Right 07/24/2013    Procedure: RIGHT arm exploration of antecubital space;  Surgeon: Sherren Kerns, MD;  Location: [facility] OR;  Service: Vascular;  Laterality: Right;  . Portacath placement  11/27/2013  . Colonoscopy N/A 12/19/2013    Procedure: COLONOSCOPY;  Surgeon: Iva Boop, MD;  Location: [facility] ENDOSCOPY;  Service: Endoscopy;  Laterality: N/A;  . Esophagogastroduodenoscopy N/A 12/19/2013    Procedure: ESOPHAGOGASTRODUODENOSCOPY (EGD);  Surgeon: Iva Boop, MD;  Location: [facility] ENDOSCOPY;  Service: Endoscopy;  Laterality: N/A;  . Tee without cardioversion N/A 01/10/2014    Procedure: TRANSESOPHAGEAL ECHOCARDIOGRAM (TEE);  Surgeon: Thurmon Fair, MD;  Location: [facility] ENDOSCOPY;  Service: Cardiovascular;  Laterality: N/A;   Family History  Problem Relation Age of Onset  . Cancer - Colon Mother   . Cancer Father   . Hypertension Father   . Diabetes    . Diabetes Sister    History  Social History  . Marital Status: Single    Spouse Name: N/A    Number of Children: 4  . Years of Education: 11th   Occupational History  . Not on file.   Social History Main Topics  . Smoking status: Former Smoker    Types: Cigarettes    Quit date: 06/19/2010  . Smokeless tobacco: Never Used  . Alcohol Use: No  . Drug Use: Yes    Special: Cocaine, Marijuana, "Crack" cocaine     Comment: past history of alcohol, cocaine and IV drug use; "stopped drugs in 2012"  . Sexual Activity: Not Currently    Partners: Male   Other Topics Concern  . Not on file   Social History Narrative   Patient lives at home with sister.    Patient is unemployed.    Patient is single.    Patient has 2 alive and 2 deceased.    Patient has 11th grade education.           Review of Systems: Pertinent items are noted in HPI.  Physical Exam: Blood pressure  191/92, pulse 95, temperature 97.7 F (36.5 C), temperature source Oral, resp. rate 23, height  (1.651 m), weight 206 lb 12.7 oz (93.8 kg), SpO2 100.00%. (BP 160s systolic on exam)  General: resting in bed HEENT: PERRL, EOMI, scleral icterus Cardiac: RRR, no rubs, murmurs or gallops, right IJ tunneled PICC line in right upper chest, clean dry and intact without tenderness or erythema Pulm: bilateral diffuse rales and rhonchi Abd: soft, non-tender, BS present, distended with fluid wave Ext: warm and well perfused, trace bilateral edema Neuro: alert and oriented X3, cranial nerves II-XII grossly intact, oriented to self, time, place, but not purpose  Lab results: Basic Metabolic Panel: No results found for this basename: NA, K, CL, CO2, GLUCOSE, BUN, CREATININE, CALCIUM, MG, PHOS,  in the last 72 hours Liver Function Tests: No results found for this basename: AST, ALT, ALKPHOS, BILITOT, PROT, ALBUMIN,  in the last 72 hours No results found for this basename: LIPASE, AMYLASE,  in the last 72 hours No results found for this basename: AMMONIA,  in the last 72 hours CBC: No results found for this basename: WBC, NEUTROABS, HGB, HCT, MCV, PLT,  in the last 72 hours Cardiac Enzymes: No results found for this basename: CKTOTAL, CKMB, CKMBINDEX, TROPONINI,  in the last 72 hours BNP: No results found for this basename: PROBNP,  in the last 72 hours D-Dimer: No results found for this basename: DDIMER,  in the last 72 hours CBG: No results found for this basename: GLUCAP,  in the last 72 hours Hemoglobin A1C: No results found for this basename: HGBA1C,  in the last 72 hours Fasting Lipid Panel: No results found for this basename: CHOL, HDL, LDLCALC, TRIG, CHOLHDL, LDLDIRECT,  in the last 72 hours Thyroid Function Tests: No results found for this basename: TSH, T4TOTAL, FREET4, T3FREE, THYROIDAB,  in the last 72 hours Anemia Panel: No results found for this basename: VITAMINB12, FOLATE,  FERRITIN, TIBC, IRON, RETICCTPCT,  in the last 72 hours Coagulation: No results found for this basename: LABPROT, INR,  in the last 72 hours Urine Drug Screen: Drugs of Abuse     Component Value Date/Time   LABOPIA NONE DETECTED 01/03/2014 2311   LABOPIA NEG 11/13/2013 1516   COCAINSCRNUR NONE DETECTED 01/03/2014 2311   COCAINSCRNUR NEG 11/13/2013 1516   LABBENZ NONE DETECTED 01/03/2014 2311   LABBENZ NEG 11/13/2013 1516   AMPHETMU NONE  DETECTED 01/03/2014 2311   THCU NONE DETECTED 01/03/2014 2311   LABBARB NONE DETECTED 01/03/2014 2311   LABBARB NEG 11/13/2013 1516    Alcohol Level: No results found for this basename: ETH,  in the last 72 hours Urinalysis: No results found for this basename: COLORURINE, APPERANCEUR, LABSPEC, PHURINE, GLUCOSEU, HGBUR, BILIRUBINUR, KETONESUR, PROTEINUR, UROBILINOGEN, NITRITE, LEUKOCYTESUR,  in the last 72 hours   Outside labs: ProBNP 7540 (highest prior 1277 on 10/04/2013)  WBC 8.4 Hemoglobin 8.0 Hematocrit 24.9 MCV 102 Platelets 194  Arterial blood gas PH 7.35 PCO2 27 PO2 75 HCO3 14.9  Ammonia 33  Urinalysis Few bacteria WBC 5 to 10  Sodium 145 Potassium 4.3 Chloride 117 CO2 14 BUN 23 Creatinine 2.1 Glucose 245  Troponin less than 0.01  Anion gap 14 Calcium 7.5 Calcium corrected 8.9 Albumin 2.6 Total protein 7.2  AST 61 ALT 19 Alkaline phosphatase is 703 Total bilirubin 2.5  Chest x-Johannesen one view: Left upper lobe opacity, mildly increased, suspicious for pneumonia Right basilar opacity, atelectasis versus pneumonia. Comparison: 01/19/2014   Imaging results:  Dg Chest 2 View  02/03/2014   CLINICAL DATA:  Dyspnea.  Recent history of HC AP  EXAM: CHEST  2 VIEW  COMPARISON:  02/02/2014  FINDINGS: Shallow inspiration with elevation of the right hemidiaphragm. Increasing consolidation or atelectasis in the right lung base with persistent patchy airspace disease in the left mid and upper lung. Probable small right pleural effusion. No  pneumothorax. Right central venous catheter is unchanged in position. Normal heart size and pulmonary vascularity.  IMPRESSION: Increasing consolidation and effusion in the right lung base with persistent focal infiltration in the left mid/ upper lung. Changes likely represent multifocal pneumonia.   Electronically Signed   By: Burman Nieves M.D.   On: 02/03/2014 05:54    Other results: EKG: normal sinus rhythm, prolonged QT interval, no concerning ST-T wave abnormalities  Assessment & Plan by Problem: Principal Problem:   Dyspnea Active Problems:   Unspecified essential hypertension   HIV disease   CKD (chronic kidney disease) stage 4, GFR 15-29 ml/min   DM2 (diabetes mellitus, type 2)   Anemia of chronic disease   Pruritus   Intermittent confusion   Acute diastolic congestive heart failure   Metabolic acidosis, increased anion gap   Elevated alkaline phosphatase level   Ascites   HCAP (healthcare-associated pneumonia)  Patient is a 53 year old female with a history of diastolic congestive heart failure, end-stage liver disease, HIV disease, stage IV chronic kidney disease, type 2 diabetes, anemia chronic disease, and seizures presenting with dyspnea, most likely due to acute congestive heart failure with concurrent multifocal pneumonia.  Dyspnea: Patient was sent from nursing home for shortness of breath and chest pain. Patient with an elevated BNP of 7540 with the highest prior in the system at 1277 on May 2015. Patient is up to 206 pounds, up from 190 on January 20, 2014. Patient presented with marked hypertension. Last echo on file from August 2015 showed ejection fraction of 60-65% and grade 1 diastolic dysfunction. Outside chest x-Stith with comparison from 01/19/2014 noted a mildly increased left upper lobe opacity suspicious for pneumonia. Patient is afebrile and there is no leukocytosis, satting well on room air. Favored to be acute congestive heart failure rather than pneumonia at  this point, though will further evaluate.  - Lasix 80 mg, if good urine output, convert to 3 times a day dosing   - monitor urine output  - Repeat chest x-Stokke, 2 views  -  blood cultures pending  (Addendum: Chest x-Heming showing concern for increasing multi-focal pneumonia. In the setting of HIV with CD4 240 with VL < 20 in August 2015, unclear risk profile)  - Will also cover for HCAP with vancomycin, zosyn, and azithromycin  - sputum culture  - Consider coverage for PCP  - Consider ID consult  Hypertension: Patient presented to outside hospital with blood pressure 240/119. Diagnosed as hypertensive emergency due to mental status change. Patient's mental status, though abnormal, is consistent with her baseline during her last hospitalization. Patient placed on nitroglycerin drip at OSH, and patient was 160-170s systolic upon exam, which represents too precipitous of a decline.  - discontinue nitro drip  - Lasix 80 mg  - continue home labetalol 600 mg twice a day  - continue home clonidine patch  - continue home hydralazine 100 mg 3 times a day  End stage liver disease with hepatitis C infection: Distended abdomen consistent with ascities. Afebrile with no leukocytosis making SBP less of a concern. Ammonia 33, down from 161 on 01/04/2014. Oriented to place, time, and self. However, lacks insight into reason for hospitalization. Elevated alkaline phosphatase level 703 (585 on 01/20/2014) and total bilirubin at 2.5 (3.6 on 01/20/2014).  - Benadryl for pruritus  - continue home lactulose 20 g 3 times a day  Chest pain: nursing home in outside emergency department reporting that patient had chest pain. On exam, patient no longer reporting chest pain, though has poor insight into reason for hospitalization. Troponin negative x1 at outside hospital.  - Repeat troponin  - repeat EKG  Anion gap metabolic acidosis: Anion gap of 14 with CO2 of 14. Lactic acid 1.2 at OSH. Chronic, likely due to chronic  kidney disease.  - Continue to monitor  Anemia of chronic disease: Hemoglobin 8.0, close to her baseline.  - Continue to monitor  Type 2 diabetes mellitus: Glucose at outside hospital 245. Last A1c was 7.5, 3 months ago.   - sliding scale insulin, moderate.  Chronic kidney disease stage IV: Creatinine 2.1, consistent with prior baseline.  - continue home sodium bicarbonate 650 mg twice a day  HIV (CD4: 240, VL <20  In August 2015)  - continue home lamivudine, ritonavir, darunavir, and abacavir  - continue acyclovir for prophylaxis  Seizure disorder  - Continue home Keppra and lacosamide.   Dispo: Disposition is deferred at this time, awaiting improvement of current medical problems. Anticipated discharge in approximately 3 day(s).   The patient does have a current PCP Christen Bame, MD) and does need an [facility] hospital follow-up appointment after discharge.  The patient does not have transportation limitations that hinder transportation to clinic appointments.  Signed: Harold Barban, MD 02/03/2014, 6:13 AM

## 2014-02-03 NOTE — Progress Notes (Signed)
*  Preliminary Results* Bilateral lower extremity venous duplex completed. Study was very technically limited due to patient body habitus and edema. There was limited visualization of bilateral lower extremity veins due to technical limitations. Visualized veins of bilateral lower extremities are negative for deep vein thrombosis. There is no evidence of Baker's cyst bilaterally.  02/03/2014  Gertie Fey, RVT, RDCS, RDMS

## 2014-02-04 ENCOUNTER — Ambulatory Visit: Payer: Self-pay | Admitting: Internal Medicine

## 2014-02-04 DIAGNOSIS — E119 Type 2 diabetes mellitus without complications: Secondary | ICD-10-CM

## 2014-02-04 DIAGNOSIS — A0472 Enterocolitis due to Clostridium difficile, not specified as recurrent: Secondary | ICD-10-CM

## 2014-02-04 DIAGNOSIS — I10 Essential (primary) hypertension: Secondary | ICD-10-CM | POA: Diagnosis present

## 2014-02-04 DIAGNOSIS — N186 End stage renal disease: Secondary | ICD-10-CM

## 2014-02-04 DIAGNOSIS — B1920 Unspecified viral hepatitis C without hepatic coma: Secondary | ICD-10-CM

## 2014-02-04 DIAGNOSIS — Z21 Asymptomatic human immunodeficiency virus [HIV] infection status: Secondary | ICD-10-CM

## 2014-02-04 DIAGNOSIS — J189 Pneumonia, unspecified organism: Principal | ICD-10-CM

## 2014-02-04 LAB — CBC WITH DIFFERENTIAL/PLATELET
Basophils Absolute: 0 10*3/uL (ref 0.0–0.1)
Basophils Relative: 0 % (ref 0–1)
Eosinophils Absolute: 0.1 10*3/uL (ref 0.0–0.7)
Eosinophils Relative: 1 % (ref 0–5)
HCT: 23.9 % — ABNORMAL LOW (ref 36.0–46.0)
HEMOGLOBIN: 7.4 g/dL — AB (ref 12.0–15.0)
LYMPHS ABS: 0.9 10*3/uL (ref 0.7–4.0)
Lymphocytes Relative: 15 % (ref 12–46)
MCH: 30.8 pg (ref 26.0–34.0)
MCHC: 31 g/dL (ref 30.0–36.0)
MCV: 99.6 fL (ref 78.0–100.0)
MONOS PCT: 9 % (ref 3–12)
Monocytes Absolute: 0.6 10*3/uL (ref 0.1–1.0)
NEUTROS ABS: 4.6 10*3/uL (ref 1.7–7.7)
NEUTROS PCT: 75 % (ref 43–77)
PLATELETS: 184 10*3/uL (ref 150–400)
RBC: 2.4 MIL/uL — AB (ref 3.87–5.11)
RDW: 19.4 % — ABNORMAL HIGH (ref 11.5–15.5)
WBC: 6.2 10*3/uL (ref 4.0–10.5)

## 2014-02-04 LAB — GLUCOSE, CAPILLARY
GLUCOSE-CAPILLARY: 186 mg/dL — AB (ref 70–99)
Glucose-Capillary: 180 mg/dL — ABNORMAL HIGH (ref 70–99)
Glucose-Capillary: 163 mg/dL — ABNORMAL HIGH (ref 70–99)
Glucose-Capillary: 133 mg/dL — ABNORMAL HIGH (ref 70–99)

## 2014-02-04 LAB — BASIC METABOLIC PANEL
Anion gap: 14 (ref 5–15)
BUN: 24 mg/dL — AB (ref 6–23)
CO2: 16 mEq/L — ABNORMAL LOW (ref 19–32)
Calcium: 7.1 mg/dL — ABNORMAL LOW (ref 8.4–10.5)
Chloride: 111 mEq/L (ref 96–112)
Creatinine, Ser: 2.09 mg/dL — ABNORMAL HIGH (ref 0.50–1.10)
GFR calc non Af Amer: 26 mL/min — ABNORMAL LOW (ref >90)
GFR, EST AFRICAN AMERICAN: 30 mL/min — AB (ref >90)
Glucose, Bld: 192 mg/dL — ABNORMAL HIGH (ref 70–99)
POTASSIUM: 4.2 meq/L (ref 3.7–5.3)
SODIUM: 141 meq/L (ref 137–147)

## 2014-02-04 MED ORDER — CLONIDINE HCL 0.3 MG/24HR TD PTWK
0.3000 mg | MEDICATED_PATCH | TRANSDERMAL | Status: DC
  Administered 2014-02-04: 0.3 mg via TRANSDERMAL
  Filled 2014-02-04: qty 1

## 2014-02-04 MED ORDER — HYDROXYZINE HCL 10 MG PO TABS
10.0000 mg | ORAL_TABLET | Freq: Four times a day (QID) | ORAL | Status: DC | PRN
  Administered 2014-02-04 – 2014-02-07 (×6): 10 mg via ORAL
  Filled 2014-02-04 (×5): qty 1

## 2014-02-04 MED ORDER — DOXAZOSIN MESYLATE 4 MG PO TABS
4.0000 mg | ORAL_TABLET | Freq: Every day | ORAL | Status: DC
  Administered 2014-02-04 – 2014-02-06 (×3): 4 mg via ORAL
  Filled 2014-02-04 (×4): qty 1

## 2014-02-04 MED ORDER — LAMIVUDINE 10 MG/ML PO SOLN
150.0000 mg | Freq: Every day | ORAL | Status: DC
  Filled 2014-02-04: qty 15

## 2014-02-04 MED ORDER — LABETALOL HCL 300 MG PO TABS
700.0000 mg | ORAL_TABLET | Freq: Two times a day (BID) | ORAL | Status: DC
  Administered 2014-02-04 – 2014-02-07 (×6): 700 mg via ORAL
  Filled 2014-02-04 (×7): qty 1

## 2014-02-04 MED ORDER — LAMIVUDINE 150 MG PO TABS
150.0000 mg | ORAL_TABLET | Freq: Every day | ORAL | Status: DC
  Administered 2014-02-04 – 2014-02-07 (×4): 150 mg via ORAL
  Filled 2014-02-04 (×4): qty 1

## 2014-02-04 MED ORDER — LABETALOL HCL 100 MG PO TABS
100.0000 mg | ORAL_TABLET | Freq: Once | ORAL | Status: AC
  Administered 2014-02-04: 100 mg via ORAL
  Filled 2014-02-04: qty 1

## 2014-02-04 NOTE — Consult Note (Addendum)
[facility]  Date of Admission:  02/03/2014  Date of Consult:  02/04/2014  Reason for Consult: Pneumonia Referring Physician: Dr Marinda Elk  Impression/Recommendation Pneumonia HIV+ ESRD C diff+ DM2 Hepatitis C  Nephrology eval (her Cr is better but her BP is out of control).  Continue po flagyl Consider stopping Valtrex Change vanco to zyvox if her CXR fails to improve Await repeat BCx Recheck CD4 and HIV RNA Legionella urine Ag  Comment- Doubt this is PCP, she is 100% on RA, CD4 > 200 and she is undetectable.  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   Thank you so much for this interesting consult,   Bobby Rumpf (pager) [phone] [email or web address]  Michelle [name] is an 53 y.o. female.  HPI: 53 yo F with hx of HIV+ (+ for 35 years, CD4 240 12-2013, ABC/3TC/DRVr), ESRD, previous CVA, DM2 (X 30+ yrs), was in hospital 8-24 to 8-28 with HCAP and MSSA in BCx. She was sent home with ancef (to end 9-7).  She was sent to hospital on 9-7 with worsening SOB and CP. WBC 8.4, afebrile in ED. Sne was noted to have BP 240/119. CXR showed: Increasing consolidation and effusion in the right lung base with persistent focal infiltration in the left mid/ upper lung. Changes likely represent multifocal pneumonia. She was also noted to have 16# wt gain and BNP 7450.  She was started on vanco/zosyn/azithro.  HIV 1 RNA Quant (copies/mL)  Date Value  01/12/2014 <20   09/08/2013 <20   05/07/2013 <20      CD4 T Cell Abs (/uL)  Date Value  01/12/2014 240*  09/08/2013 280*  05/07/2013 490    Past Medical History  Diagnosis Date  . Meningitis   . HIV (human immunodeficiency virus infection) 1981  . Hypertension   . Gout   . Muscle spasms of head and/or neck   . Hypertensive encephalopathy ~ 05/2013    hospitalaized/notes 06/18/2013  . Anxiety     [name] 06/18/2013  . History of syphilis   . High cholesterol    . Cirrhosis     hepatitis C   . GERD (gastroesophageal reflux disease)   . Psychosis   . Chronic venous stasis dermatitis of both lower extremities   . CRD (chronic renal disease), stage IV   . Nephrotic syndrome   . CHF (congestive heart failure)     "one time" (01/22/2014)  . Myocardial infarction 2015    "ealy part of this year"  . End stage liver disease   . AIHA (autoimmune hemolytic anemia)     [name] 06/18/2013  . Anemia   . HCAP (healthcare-associated pneumonia) 12/2013  . Exertional shortness of breath   . Type II diabetes mellitus   . History of blood transfusion 12/2013 X ?    "I've had a few recently" (01/22/2014)  . Hepatitis C     pt denies this hx on 01/22/2014  . Migraine     "all the time" (01/22/2014)  . Seizures     "last sz was 10/2013" (01/22/2014)  . Stroke     "I've had a few; I've recovered from all of them; had one earlier this year too" (01/22/2014)  . Arthritis     "all over; comes and goes" (01/22/2014)  . Chronic back pain     "on and off" (01/22/2014)    Past Surgical History  Procedure Laterality Date  . Hip pinning Right  1980's  . Av fistula placement Right 07/24/2013    Procedure: RIGHT arm exploration of antecubital space;  Surgeon: Elam Dutch, MD;  Location: [facility];  Service: Vascular;  Laterality: Right;  . Portacath placement  11/27/2013  . Colonoscopy N/A 12/19/2013    Procedure: COLONOSCOPY;  Surgeon: Gatha Mayer, MD;  Location: [facility];  Service: Endoscopy;  Laterality: N/A;  . Esophagogastroduodenoscopy N/A 12/19/2013    Procedure: ESOPHAGOGASTRODUODENOSCOPY (EGD);  Surgeon: Gatha Mayer, MD;  Location[facility];  Service: Endoscopy;  Laterality: N/A;  . Tee without cardioversion N/A 01/10/2014    Procedure: TRANSESOPHAGEAL ECHOCARDIOGRAM (TEE);  Surgeon: Sanda Klein, MD;  Location: [facility] ENDOSCOPY;  Service: Cardiovascular;  Laterality: N/A;     Allergies  Allergen Reactions  . 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 Morphine And Related Hives, Itching and Rash  . Norvasc [Amlodipine Besylate] Hives, Itching and Rash    Medications:  Scheduled: . abacavir  600 mg Oral Daily  . acyclovir  800 mg Oral Daily  . aspirin EC  81 mg Oral Daily  . cloNIDine  0.3 mg Transdermal Weekly  . Darunavir Ethanolate  800 mg Oral Q breakfast  . heparin  5,000 Units Subcutaneous 3 times per day  . hydrALAZINE  100 mg Oral 3 times per day  . hydrocerin  1 application Topical BID  . insulin aspart  0-15 Units Subcutaneous TID WC  . insulin aspart  0-5 Units Subcutaneous QHS  . labetalol  700 mg Oral BID  . lacosamide  150 mg Oral BID  . lactulose  20 g Oral TID  . lamiVUDine  150 mg Oral Daily  . levETIRAcetam  1,500 mg Oral BID  . metroNIDAZOLE  500 mg Oral 3 times per day  . pantoprazole  40 mg Oral Daily  . piperacillin-tazobactam (ZOSYN)  IV  3.375 g Intravenous 3 times per day  . ritonavir  100 mg Oral Q breakfast  . sodium bicarbonate  650 mg Oral BID  . sodium chloride  3 mL Intravenous Q12H  . vancomycin  1,250 mg Intravenous Q24H    Abtx:  Anti-infectives   Start     Dose/Rate Route Frequency Ordered Stop   02/04/14 1000  azithromycin (ZITHROMAX) tablet 500 mg  Status:  Discontinued     500 mg Oral Daily 02/03/14 1053 02/03/14 1312   02/04/14 1000  lamiVUDine (EPIVIR) 10 MG/ML solution 150 mg  Status:  Discontinued     150 mg Oral Daily 02/04/14 0728 02/04/14 0813   02/04/14 1000  lamiVUDine (EPIVIR) tablet 150 mg     150 mg Oral Daily 02/04/14 0813     02/04/14 0600  vancomycin (VANCOCIN) IVPB 750 mg/150 ml premix  Status:  Discontinued     750 mg 150 mL/hr over 60 Minutes Intravenous Every 24 hours 02/03/14 0657 02/03/14 0858   02/04/14 0600  vancomycin (VANCOCIN) 1,250 mg in sodium chloride 0.9 % 250 mL IVPB     1,250 mg 166.7 mL/hr over 90 Minutes Intravenous Every 24 hours 02/03/14 0858     02/03/14 1130  metroNIDAZOLE (FLAGYL)  tablet 500 mg     500 mg Oral 3 times per day 02/03/14 1124     02/03/14 1000  lamiVUDine (EPIVIR) 10 MG/ML solution 100 mg  Status:  Discontinued     100 mg Oral Daily 02/03/14 0440 02/04/14 0728   02/03/14 1000  abacavir (ZIAGEN) tablet 600  mg     600 mg Oral Daily 02/03/14 0440     02/03/14 1000  acyclovir (ZOVIRAX) tablet 800 mg     800 mg Oral Daily 02/03/14 0440     02/03/14 1000  azithromycin (ZITHROMAX) tablet 500 mg  Status:  Discontinued     500 mg Oral Daily 02/03/14 0632 02/03/14 1046   02/03/14 0800  Darunavir Ethanolate (PREZISTA) tablet 800 mg     800 mg Oral Daily with breakfast 02/03/14 0440     02/03/14 0800  ritonavir (NORVIR) capsule 100 mg     100 mg Oral Daily with breakfast 02/03/14 0440     02/03/14 0800  vancomycin (VANCOCIN) 1,500 mg in sodium chloride 0.9 % 500 mL IVPB     1,500 mg 250 mL/hr over 120 Minutes Intravenous  Once 02/03/14 0657 02/03/14 1517   02/03/14 0730  piperacillin-tazobactam (ZOSYN) IVPB 3.375 g     3.375 g 12.5 mL/hr over 240 Minutes Intravenous 3 times per day 02/03/14 0657        Total days of antibiotics 1 (vanco/zosyn/azithro/flagyl)          Social History:  reports that she quit smoking about 3 years ago. Her smoking use included Cigarettes. She smoked 0.00 packs per day. She has never used smokeless tobacco. She reports that she uses illicit drugs (Cocaine, Marijuana, and "Crack" cocaine). She reports that she does not drink alcohol.  Family History  Problem Relation Age of Onset  . Cancer - Colon Mother   . Cancer Father   . Hypertension Father   . Diabetes    . Diabetes Sister     General ROS: + diarrhea, + yellow sputum, she is not sure if she is on HD, c/o abd pain after eating, see HPI  Blood pressure 202/116, pulse 105, temperature 97.8 F (36.6 C), temperature source Oral, resp. rate 29, height 5' 5"  (1.651 m), weight 93.7 kg (206 lb 9.1 oz), SpO2 97.00%. General appearance: alert, cooperative, no distress and MSE  3/3 Eyes: negative findings: pupils equal, round, reactive to light and accomodation Throat: normal findings: oropharynx pink & moist without lesions or evidence of thrush Neck: no adenopathy and supple, symmetrical, trachea midline Lungs: rhonchi bilaterally Heart: regular rate and rhythm Abdomen: normal findings: bowel sounds normal and soft, non-tender Extremities: edema 2+ and excoriated areas, no diabetic foot lesions.    Results for orders placed during the hospital encounter of 02/03/14 (from the past 48 hour(s))  MRSA PCR SCREENING     Status: None   Collection Time    02/03/14  3:24 AM      Result Value Ref Range   MRSA by PCR NEGATIVE  NEGATIVE   Comment:            The GeneXpert MRSA Assay (FDA     approved for NASAL specimens     only), is one component of a     comprehensive MRSA colonization     surveillance program. It is not     intended to diagnose MRSA     infection nor to guide or     monitor treatment for     MRSA infections.  COMPREHENSIVE METABOLIC PANEL     Status: Abnormal   Collection Time    02/03/14  6:25 AM      Result Value Ref Range   Sodium 139  137 - 147 mEq/L   Potassium 4.2  3.7 - 5.3 mEq/L   Chloride 111  96 -  112 mEq/L   CO2 15 (*) 19 - 32 mEq/L   Glucose, Bld 206 (*) 70 - 99 mg/dL   BUN 23  6 - 23 mg/dL   Creatinine, Ser 2.01 (*) 0.50 - 1.10 mg/dL   Calcium 7.4 (*) 8.4 - 10.5 mg/dL   Total Protein 7.4  6.0 - 8.3 g/dL   Albumin 1.5 (*) 3.5 - 5.2 g/dL   AST 47 (*) 0 - 37 U/L   ALT 8  0 - 35 U/L   Alkaline Phosphatase 598 (*) 39 - 117 U/L   Total Bilirubin 2.1 (*) 0.3 - 1.2 mg/dL   GFR calc non Af Amer 27 (*) >90 mL/min   GFR calc Af Amer 31 (*) >90 mL/min   Comment: (NOTE)     The eGFR has been calculated using the CKD EPI equation.     This calculation has not been validated in all clinical situations.     eGFR's persistently <90 mL/min signify possible Chronic Kidney     Disease.   Anion gap 13  5 - 15  TROPONIN I     Status:  None   Collection Time    02/03/14  6:25 AM      Result Value Ref Range   Troponin I <0.30  <0.30 ng/mL   Comment:            Due to the release kinetics of cTnI,     a negative result within the first hours     of the onset of symptoms does not rule out     myocardial infarction with certainty.     If myocardial infarction is still suspected,     repeat the test at appropriate intervals.  CULTURE, BLOOD (ROUTINE X 2)     Status: None   Collection Time    02/03/14  6:25 AM      Result Value Ref Range   Specimen Description BLOOD LEFT HAND     Special Requests BOTTLES DRAWN AEROBIC ONLY 3CC     Culture  Setup Time       Value: 02/03/2014 14:16     Performed at [facility]   Culture       Value:        BLOOD CULTURE RECEIVED NO GROWTH TO DATE CULTURE WILL BE HELD FOR 5 DAYS BEFORE ISSUING A FINAL NEGATIVE REPORT     Performed at [facility]   Report Status PENDING    CBC     Status: Abnormal   Collection Time    02/03/14  6:25 AM      Result Value Ref Range   WBC 6.2  4.0 - 10.5 K/uL   RBC 2.44 (*) 3.87 - 5.11 MIL/uL   Hemoglobin 7.8 (*) 12.0 - 15.0 g/dL   HCT 24.8 (*) 36.0 - 46.0 %   MCV 101.6 (*) 78.0 - 100.0 fL   MCH 32.0  26.0 - 34.0 pg   MCHC 31.5  30.0 - 36.0 g/dL   RDW 19.3 (*) 11.5 - 15.5 %   Platelets 185  150 - 400 K/uL  LACTATE DEHYDROGENASE     Status: Abnormal   Collection Time    02/03/14  6:25 AM      Result Value Ref Range   LDH 400 (*) 94 - 250 U/L   Comment: HEMOLYSIS AT THIS LEVEL MAY AFFECT RESULT  CULTURE, BLOOD (ROUTINE X 2)     Status: None   Collection Time  02/03/14  6:35 AM      Result Value Ref Range   Specimen Description BLOOD LEFT WRIST     Special Requests       Value: BOTTLES DRAWN AEROBIC AND ANAEROBIC BLUE 10CC RED 5CC   Culture  Setup Time       Value: 02/03/2014 14:16     Performed at [facility]   Culture       Value:        BLOOD CULTURE RECEIVED NO GROWTH TO DATE CULTURE WILL BE HELD FOR 5 DAYS  BEFORE ISSUING A FINAL NEGATIVE REPORT     Performed at [facility]   Report Status PENDING    CLOSTRIDIUM DIFFICILE BY PCR     Status: Abnormal   Collection Time    02/03/14  6:55 AM      Result Value Ref Range   C difficile by pcr POSITIVE (*) NEGATIVE   Comment: CRITICAL RESULT CALLED TO, READ BACK BY AND VERIFIED WITH:     DECENA RN 11:00 02/03/14 ([email or web address])  GLUCOSE, CAPILLARY     Status: Abnormal   Collection Time    02/03/14  8:12 AM      Result Value Ref Range   Glucose-Capillary 198 (*) 70 - 99 mg/dL  AMMONIA     Status: Abnormal   Collection Time    02/03/14 11:53 AM      Result Value Ref Range   Ammonia 70 (*) 11 - 60 umol/L  COMPREHENSIVE METABOLIC PANEL     Status: Abnormal   Collection Time    02/03/14 12:00 PM      Result Value Ref Range   Sodium 138  137 - 147 mEq/L   Potassium 4.3  3.7 - 5.3 mEq/L   Chloride 111  96 - 112 mEq/L   CO2 15 (*) 19 - 32 mEq/L   Glucose, Bld 291 (*) 70 - 99 mg/dL   BUN 23  6 - 23 mg/dL   Creatinine, Ser 2.02 (*) 0.50 - 1.10 mg/dL   Calcium 7.4 (*) 8.4 - 10.5 mg/dL   Total Protein 6.4  6.0 - 8.3 g/dL   Albumin 1.3 (*) 3.5 - 5.2 g/dL   AST 41 (*) 0 - 37 U/L   ALT 7  0 - 35 U/L   Alkaline Phosphatase 520 (*) 39 - 117 U/L   Total Bilirubin 2.0 (*) 0.3 - 1.2 mg/dL   GFR calc non Af Amer 27 (*) >90 mL/min   GFR calc Af Amer 31 (*) >90 mL/min   Comment: (NOTE)     The eGFR has been calculated using the CKD EPI equation.     This calculation has not been validated in all clinical situations.     eGFR's persistently <90 mL/min signify possible Chronic Kidney     Disease.   Anion gap 12  5 - 15  GLUCOSE, CAPILLARY     Status: Abnormal   Collection Time    02/03/14 12:32 PM      Result Value Ref Range   Glucose-Capillary 283 (*) 70 - 99 mg/dL  GLUCOSE, CAPILLARY     Status: Abnormal   Collection Time    02/03/14  4:45 PM      Result Value Ref Range   Glucose-Capillary 261 (*) 70 - 99 mg/dL  GLUCOSE, CAPILLARY     Status:  Abnormal   Collection Time    02/03/14  9:37 PM      Result Value Ref Range  Glucose-Capillary 132 (*) 70 - 99 mg/dL  BASIC METABOLIC PANEL     Status: Abnormal   Collection Time    02/04/14  5:40 AM      Result Value Ref Range   Sodium 141  137 - 147 mEq/L   Potassium 4.2  3.7 - 5.3 mEq/L   Chloride 111  96 - 112 mEq/L   CO2 16 (*) 19 - 32 mEq/L   Glucose, Bld 192 (*) 70 - 99 mg/dL   BUN 24 (*) 6 - 23 mg/dL   Creatinine, Ser 2.09 (*) 0.50 - 1.10 mg/dL   Calcium 7.1 (*) 8.4 - 10.5 mg/dL   GFR calc non Af Amer 26 (*) >90 mL/min   GFR calc Af Amer 30 (*) >90 mL/min   Comment: (NOTE)     The eGFR has been calculated using the CKD EPI equation.     This calculation has not been validated in all clinical situations.     eGFR's persistently <90 mL/min signify possible Chronic Kidney     Disease.   Anion gap 14  5 - 15  CBC WITH DIFFERENTIAL     Status: Abnormal   Collection Time    02/04/14  5:40 AM      Result Value Ref Range   WBC 6.2  4.0 - 10.5 K/uL   RBC 2.40 (*) 3.87 - 5.11 MIL/uL   Hemoglobin 7.4 (*) 12.0 - 15.0 g/dL   HCT 23.9 (*) 36.0 - 46.0 %   MCV 99.6  78.0 - 100.0 fL   MCH 30.8  26.0 - 34.0 pg   MCHC 31.0  30.0 - 36.0 g/dL   RDW 19.4 (*) 11.5 - 15.5 %   Platelets 184  150 - 400 K/uL   Neutrophils Relative % 75  43 - 77 %   Neutro Abs 4.6  1.7 - 7.7 K/uL   Lymphocytes Relative 15  12 - 46 %   Lymphs Abs 0.9  0.7 - 4.0 K/uL   Monocytes Relative 9  3 - 12 %   Monocytes Absolute 0.6  0.1 - 1.0 K/uL   Eosinophils Relative 1  0 - 5 %   Eosinophils Absolute 0.1  0.0 - 0.7 K/uL   Basophils Relative 0  0 - 1 %   Basophils Absolute 0.0  0.0 - 0.1 K/uL  GLUCOSE, CAPILLARY     Status: Abnormal   Collection Time    02/04/14  8:12 AM      Result Value Ref Range   Glucose-Capillary 180 (*) 70 - 99 mg/dL  GLUCOSE, CAPILLARY     Status: Abnormal   Collection Time    02/04/14 11:58 AM      Result Value Ref Range   Glucose-Capillary 186 (*) 70 - 99 mg/dL        Component Value Date/Time   SDES BLOOD LEFT WRIST 02/03/2014 0635   SPECREQUEST BOTTLES DRAWN AEROBIC AND ANAEROBIC BLUE 10CC RED 5CC 02/03/2014 0635   CULT  Value:        BLOOD CULTURE RECEIVED NO GROWTH TO DATE CULTURE WILL BE HELD FOR 5 DAYS BEFORE ISSUING A FINAL NEGATIVE REPORT Performed at [facility] 02/03/2014 0635   REPTSTATUS PENDING 02/03/2014 0635   Dg Chest 2 View  02/03/2014   CLINICAL DATA:  Dyspnea.  Recent history of HC AP  EXAM: CHEST  2 VIEW  COMPARISON:  02/02/2014  FINDINGS: Shallow inspiration with elevation of the right hemidiaphragm. Increasing consolidation or atelectasis in the  right lung base with persistent patchy airspace disease in the left mid and upper lung. Probable small right pleural effusion. No pneumothorax. Right central venous catheter is unchanged in position. Normal heart size and pulmonary vascularity.  IMPRESSION: Increasing consolidation and effusion in the right lung base with persistent focal infiltration in the left mid/ upper lung. Changes likely represent multifocal pneumonia.   Electronically Signed   By: Lucienne Capers M.D.   On: 02/03/2014 05:54   US Abdomen Limited  02/03/2014   CLINICAL DATA:  Ascites, abdominal distention  EXAM: LIMITED ABDOMINAL ULTRASOUND  COMPARISON:  CT 01/10/2014  FINDINGS: No significant ascites identified at evaluation of the 4 abdominal quadrants.  IMPRESSION: No ascites identified.   Electronically Signed   By: Conchita Paris M.D.   On: 02/03/2014 15:10   Recent Results (from the past 240 hour(s))  MRSA PCR SCREENING     Status: None   Collection Time    02/03/14  3:24 AM      Result Value Ref Range Status   MRSA by PCR NEGATIVE  NEGATIVE Final   Comment:            The GeneXpert MRSA Assay (FDA     approved for NASAL specimens     only), is one component of a     comprehensive MRSA colonization     surveillance program. It is not     intended to diagnose MRSA     infection nor to guide or     monitor  treatment for     MRSA infections.  CULTURE, BLOOD (ROUTINE X 2)     Status: None   Collection Time    02/03/14  6:25 AM      Result Value Ref Range Status   Specimen Description BLOOD LEFT HAND   Final   Special Requests BOTTLES DRAWN AEROBIC ONLY 3CC   Final   Culture  Setup Time     Final   Value: 02/03/2014 14:16     Performed at [facility]   Culture     Final   Value:        BLOOD CULTURE RECEIVED NO GROWTH TO DATE CULTURE WILL BE HELD FOR 5 DAYS BEFORE ISSUING A FINAL NEGATIVE REPORT     Performed at [facility]   Report Status PENDING   Incomplete  CULTURE, BLOOD (ROUTINE X 2)     Status: None   Collection Time    02/03/14  6:35 AM      Result Value Ref Range Status   Specimen Description BLOOD LEFT WRIST   Final   Special Requests     Final   Value: BOTTLES DRAWN AEROBIC AND ANAEROBIC BLUE 10CC RED 5CC   Culture  Setup Time     Final   Value: 02/03/2014 14:16     Performed at [facility]   Culture     Final   Value:        BLOOD CULTURE RECEIVED NO GROWTH TO DATE CULTURE WILL BE HELD FOR 5 DAYS BEFORE ISSUING A FINAL NEGATIVE REPORT     Performed at [facility]   Report Status PENDING   Incomplete  CLOSTRIDIUM DIFFICILE BY PCR     Status: Abnormal   Collection Time    02/03/14  6:55 AM      Result Value Ref Range Status   C difficile by pcr POSITIVE (*) NEGATIVE Final   Comment: CRITICAL RESULT CALLED TO, READ BACK  BY AND VERIFIED WITH:     [place] RN 11:00 02/03/14 ([email or web address])      02/04/2014, 1:09 PM     LOS: 1 day

## 2014-02-04 NOTE — Progress Notes (Addendum)
Internal Medicine Attending  Date: 02/04/2014  Patient name: MONIKA BORROWMAN Medical record number: [id number] Date of birth: May 13, 1961 Age: 53 y.o. Gender: female  I saw and evaluated the patient, and discussed her care with resident on A.M rounds.  I reviewed the resident's note by Dr. Isabella Bowens and I agree with the resident's findings and plans as documented in her note,  with the following additional comments.  Blood pressure is poorly controlled, and management is made more difficult by chronic renal insufficiency and nephrotic syndrome; patient was previously on an ACE inhibitor but this was stopped in August because of acute worsening of her renal function with concern that the ACE inhibitor was possibly contributing.  She has been on furosemide in the past, but this was also stopped in August due to concerns about volume depletion.  Agree with plans to titrate labetalol up with close attention to heart rate; would consider resuming a low dose of furosemide with attention to avoid intravascular volume depletion, and might consider a low dose of amlodipine.  Her clonidine was previously switched to a Catapres patch because of concerns about noncompliance on oral clonidine at home.  Input from her nephrologist may be helpful regarding best options for long-term management of her blood pressure.  Agree with plan to consult ID regarding chest x-[place] findings consistent with pneumonia.  Addendum: Patient is allergic to amlodipine, so that will not be an option.

## 2014-02-04 NOTE — Progress Notes (Signed)
Subjective: Hypertensive last night to 232/110 but asymptomatic. BP down to 189/114 this morning. She reports her breathing is improving. She had some chest pain last night similar to previous chest pain. Denies nausea/vomiting. Tolerating diet. BM x 5 last night.   Objective: Vital signs in last 24 hours: Filed Vitals:   02/04/14 0100 02/04/14 0300 02/04/14 0400 02/04/14 0809  BP: 191/92 232/110 189/114 219/104  Pulse: 79 81 92 98  Temp:   98.1 F (36.7 C) 98.6 F (37 C)  TempSrc:   Oral Oral  Resp: Height:      Weight:   206 lb 9.1 oz (93.7 kg)   SpO2: 100% 100% 100% 98%    Weight change: -3.5 oz (-0.1 kg)  Intake/Output Summary (Last 24 hours) at 02/04/14 0813 Last data filed at 02/04/14 0659  Gross per 24 hour  Intake    823 ml  Output   1000 ml  Net   -177 ml   General: sitting up in bed HEENT: PERRL, EOMI, scleral icterus  Cardiac: RRR, no rubs, murmurs or gallops Chest: right IJ tunneled PICC line in right upper chest, clean dry and intact without tenderness or erythema  Pulm: right lung base rales, clear to auscultation otherwise, breathing comfortably on 2L [place]  Abd: soft, mildly TTP diffusely, BS present Ext: warm and well perfused, trace bilateral edema  Neuro: Awake and alert, oriented X3, cranial nerves II-XII grossly intact, oriented to self, time, place  Lab Results: Basic Metabolic Panel:  Recent Labs Lab 02/03/14 1200 02/04/14 0540  NA 138 141  K 4.3 4.2  CL 111 111  CO2 15* 16*  GLUCOSE 291* 192*  BUN 23 24*  CREATININE 2.02* 2.09*  CALCIUM 7.4* 7.1*   Liver Function Tests:  Recent Labs Lab 02/03/14 0625 02/03/14 1200  AST 47* 41*  ALT 8 7  ALKPHOS 598* 520*  BILITOT 2.1* 2.0*  PROT 7.4 6.4  ALBUMIN 1.5* 1.3*   CBC: OSH WBC 8.4  Hemoglobin 8.0  Hematocrit 24.9  MCV 102  Platelets 194  Recent Labs Lab 02/03/14 0625 02/04/14 0540  WBC 6.2 6.2  NEUTROABS  --  4.6  HGB 7.8* 7.4*  HCT 24.8* 23.9*  MCV  101.6* 99.6  PLT 185 184   Cardiac Enzymes:  Recent Labs Lab 02/03/14 0625  TROPONINI <0.30   BNP: OSH: ProBNP 7540 (highest prior 1277 on 10/04/2013)  CBG:  Recent Labs Lab 02/03/14 0812 02/03/14 1232 02/03/14 1645 02/03/14 2137  GLUCAP 198* 283* 261* 132*   Coagulation: OSH: INR 1  Urinalysis: OSH: Few bacteria  WBC 5 to 10  Misc. Labs: LDH 02/03/2014 400  Micro Results: Recent Results (from the past 240 hour(s))  MRSA PCR SCREENING     Status: None   Collection Time    02/03/14  3:24 AM      Result Value Ref Range Status   MRSA by PCR NEGATIVE  NEGATIVE Final   Comment:            The GeneXpert MRSA Assay (FDA     approved for NASAL specimens     only), is one component of a     comprehensive MRSA colonization     surveillance program. It is not     intended to diagnose MRSA     infection nor to guide or     monitor treatment for     MRSA infections.  CLOSTRIDIUM DIFFICILE BY PCR  Status: Abnormal   Collection Time    02/03/14  6:55 AM      Result Value Ref Range Status   C difficile by pcr POSITIVE (*) NEGATIVE Final   Comment: CRITICAL RESULT CALLED TO, READ BACK BY AND VERIFIED WITH:     DECENA RN 11:00 02/03/14 ([email or web address])   Studies/Results: Dg Chest 2 View  02/03/2014   CLINICAL DATA:  Dyspnea.  Recent history of HC AP  EXAM: CHEST  2 VIEW  COMPARISON:  02/02/2014  FINDINGS: Shallow inspiration with elevation of the right hemidiaphragm. Increasing consolidation or atelectasis in the right lung base with persistent patchy airspace disease in the left mid and upper lung. Probable small right pleural effusion. No pneumothorax. Right central venous catheter is unchanged in position. Normal heart size and pulmonary vascularity.  IMPRESSION: Increasing consolidation and effusion in the right lung base with persistent focal infiltration in the left mid/ upper lung. Changes likely represent multifocal pneumonia.   Electronically Signed   By: Burman Nieves  M.D.   On: 02/03/2014 05:54   US Abdomen Limited  02/03/2014   CLINICAL DATA:  Ascites, abdominal distention  EXAM: LIMITED ABDOMINAL ULTRASOUND  COMPARISON:  CT 01/10/2014  FINDINGS: No significant ascites identified at evaluation of the 4 abdominal quadrants.  IMPRESSION: No ascites identified.   Electronically Signed   By: Christiana Pellant M.D.   On: 02/03/2014 15:10   Medications: I have reviewed the patient's current medications. Scheduled Meds: . abacavir  600 mg Oral Daily  . acyclovir  800 mg Oral Daily  . aspirin EC  81 mg Oral Daily  . cloNIDine  0.3 mg Transdermal Weekly  . Darunavir Ethanolate  800 mg Oral Q breakfast  . heparin  5,000 Units Subcutaneous 3 times per day  . hydrALAZINE  100 mg Oral 3 times per day  . hydrocerin  1 application Topical BID  . insulin aspart  0-15 Units Subcutaneous TID WC  . insulin aspart  0-5 Units Subcutaneous QHS  . labetalol  600 mg Oral BID  . lacosamide  150 mg Oral BID  . lactulose  20 g Oral TID  . lamiVUDine  150 mg Oral Daily  . levETIRAcetam  1,500 mg Oral BID  . metroNIDAZOLE  500 mg Oral 3 times per day  . pantoprazole  40 mg Oral Daily  . piperacillin-tazobactam (ZOSYN)  IV  3.375 g Intravenous 3 times per day  . ritonavir  100 mg Oral Q breakfast  . sodium bicarbonate  650 mg Oral BID  . sodium chloride  3 mL Intravenous Q12H  . vancomycin  1,250 mg Intravenous Q24H   Continuous Infusions:  PRN Meds:.acetaminophen, hydrOXYzine, sodium chloride Assessment/Plan: Principal Problem:   HCAP (healthcare-associated pneumonia) Active Problems:   Unspecified essential hypertension   HIV disease   CKD (chronic kidney disease) stage 4, GFR 15-29 ml/min   DM2 (diabetes mellitus, type 2)   Anemia of chronic disease   Unspecified protein-calorie malnutrition   Pruritus   Intermittent confusion   Acute diastolic congestive heart failure   Metabolic acidosis, increased anion gap   Elevated alkaline phosphatase level   Ascites    Dyspnea   C. difficile diarrhea  Dyspnea: Last echo August 2015 ejection fraction of 60-65% and grade 1 diastolic dysfunction. CXR 2 view done here 02/03/2014 shows increasing consolidation and effusion in the right lung base with persistent focal infiltration in the left mid/ upper lung, likely representing multifocal pneumonia. Patient is afebrile and there is no  leukocytosis, satting well on room air. Acute on chronic diastolic dysfunction congestive heart versus HCAP (HCAP risk factors include being hospitalized for 2 or more days within the past 90 days (discharged on 8/28), being a resident of a nursing home, and being recipient of recent intravenous antibiotic therapy.) Received Lasix  x 1 02/03/2014. LE venous duplex limited but negative for DVT. 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 Ammonia level 70 on 02/03/2914. US abdomen with no significant ascites. - hydroxyzine  Q6hr prn itching - continue home lactulose 20 g 3 times a day   Chest pain: troponin negative x 2, EKG with T wave inversion in V1, unchanged from previous otherwise -continue to monitor  Non anion gap metabolic acidosis: Anion gap of 14 with CO2 up to 16. Lactic acid 1.2 at OSH. Chronic, likely due to chronic kidney disease.   - Continue to monitor   Anemia of chronic disease: Hemoglobin 7.4 - Continue to monitor   Type 2 diabetes mellitus: Last A1c was 7.5, 3 months  ago.  - sliding scale insulin, moderate.   Chronic kidney disease stage IV: Creatinine 2.1, at prior baseline.  - continue home sodium bicarbonate 650 mg twice a day   HIV (CD4: 240, VL <20 In August 2015)  - continue home lamivudine, ritonavir, darunavir, and abacavir  - continue acyclovir for prophylaxis   Seizure disorder  - Continue home Keppra and lacosamide.   Dispo: Disposition is deferred at this time, awaiting improvement of current medical problems.  Anticipated discharge in approximately 1 day(s).   The patient does have a current PCP Christen Bame, MD) and does need an [facility] hospital follow-up appointment after discharge.  The patient does not have transportation limitations that hinder transportation to clinic appointments.  .Services Needed at time of discharge: Y = Yes, Blank = No PT:   OT:   RN:   Equipment:   Other:     LOS: 1 day   Michelle Basil, MD 02/04/2014, 8:13 AM

## 2014-02-04 NOTE — Progress Notes (Signed)
BP 232/110. MD advised. No new orders received.

## 2014-02-04 NOTE — Consult Note (Signed)
Reason for Consult: Resistant hypertension Referring Physician: Bertha Stakes M.D. 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  Admitted to the hospital with worsening shortness of breath and diarrhea/abdominal pain yesterday. Overnight noted to have exceeding difficulty in control of her blood pressure. Blood pressures ranging anywhere from 767 to 209 systolic and 47-096 diastolic. Her heart rate is between 87-105. She reports to be in discomfort from abdominal pain/diarrhea. She denies any chest pain but continues to have shortness of breath. Endorses some chest tightness.  Blood pressure medications include: Hydralazine 127m 3 times a day (started this morning at 6 AM) Clonidine patch 0.3 mg (started this morning at 8:30 AM) Labetalol 700 mg twice a day (increased dose to start 10 PM tonight-previously 600 the twice a day)    Past Medical History  Diagnosis Date  . Meningitis   . HIV (human immunodeficiency virus infection) 1981  . Hypertension   . Gout   . Muscle spasms of head and/or neck   . Hypertensive encephalopathy ~ 05/2013    hospitalaized/notes 06/18/2013  . Anxiety     /[name]1/20/2015  . History of syphilis   . High cholesterol   . Cirrhosis     hepatitis C   . GERD (gastroesophageal reflux disease)   . Psychosis   . Chronic venous stasis dermatitis of both lower extremities   . CRD (chronic renal disease), stage IV   . Nephrotic syndrome   . CHF (congestive heart failure)     "one time" (01/22/2014)  . Myocardial infarction 2015    "ealy part of this year"  . End stage liver disease   . AIHA (autoimmune hemolytic anemia)     /[name]1/20/2015  . Anemia   . HCAP (healthcare-associated pneumonia) 12/2013  . Exertional shortness of breath   . Type II diabetes mellitus   . History  of blood transfusion 12/2013 X ?    "I've had a few recently" (01/22/2014)  . Hepatitis C     pt denies this hx on 01/22/2014  . Migraine     "all the time" (01/22/2014)  . Seizures     "last sz was 10/2013" (01/22/2014)  . Stroke     "I've had a few; I've recovered from all of them; had one earlier this year too" (01/22/2014)  . Arthritis     "all over; comes and goes" (01/22/2014)  . Chronic back pain     "on and off" (01/22/2014)    Past Surgical History  Procedure Laterality Date  . Hip pinning Right 1980's  . Av fistula placement Right 07/24/2013    Procedure: RIGHT arm exploration of antecubital space;  Surgeon: CElam Dutch MD;  [facility]  Service: Vascular;  Laterality: Right;  . Portacath placement  11/27/2013  . Colonoscopy N/A 12/19/2013    Procedure: COLONOSCOPY;  Surgeon: CGatha Mayer MD;  [facility]  Service: Endoscopy;  Laterality: N/A;  . Esophagogastroduodenoscopy N/A 12/19/2013    Procedure: ESOPHAGOGASTRODUODENOSCOPY (EGD);  Surgeon: CGatha Mayer MD;  [facility];  Service: Endoscopy;  Laterality: N/A;  . Tee without cardioversion N/A 01/10/2014    Procedure: TRANSESOPHAGEAL ECHOCARDIOGRAM (TEE);  Surgeon: MSanda Klein MD;  [facility];  Service: Cardiovascular;  Laterality: N/A;    Family History  Problem Relation Age of Onset  . Cancer - Colon Mother   .  Cancer Father   . Hypertension Father   . Diabetes    . Diabetes Sister     Social History:  reports that she quit smoking about 3 years ago. Her smoking use included Cigarettes. She smoked 0.00 packs per day. She has never used smokeless tobacco. She reports that she uses illicit drugs (Cocaine, Marijuana, and "Crack" cocaine). She reports that she does not drink alcohol.  Allergies:  Allergies  Allergen Reactions  . 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 Morphine And Related Hives, Itching  and Rash  . Norvasc [Amlodipine Besylate] Hives, Itching and Rash    Medications:  Scheduled: . abacavir  600 mg Oral Daily  . acyclovir  800 mg Oral Daily  . aspirin EC  81 mg Oral Daily  . cloNIDine  0.3 mg Transdermal Weekly  . Darunavir Ethanolate  800 mg Oral Q breakfast  . heparin  5,000 Units Subcutaneous 3 times per day  . hydrALAZINE  100 mg Oral 3 times per day  . hydrocerin  1 application Topical BID  . insulin aspart  0-15 Units Subcutaneous TID WC  . insulin aspart  0-5 Units Subcutaneous QHS  . labetalol  700 mg Oral BID  . lacosamide  150 mg Oral BID  . lactulose  20 g Oral TID  . lamiVUDine  150 mg Oral Daily  . levETIRAcetam  1,500 mg Oral BID  . metroNIDAZOLE  500 mg Oral 3 times per day  . pantoprazole  40 mg Oral Daily  . piperacillin-tazobactam (ZOSYN)  IV  3.375 g Intravenous 3 times per day  . ritonavir  100 mg Oral Q breakfast  . sodium bicarbonate  650 mg Oral BID  . sodium chloride  3 mL Intravenous Q12H  . vancomycin  1,250 mg Intravenous Q24H    Results for orders placed during the hospital encounter of 02/03/14 (from the past 48 hour(s))  MRSA PCR SCREENING     Status: None   Collection Time    02/03/14  3:24 AM      Result Value Ref Range   MRSA by PCR NEGATIVE  NEGATIVE   Comment:            The GeneXpert MRSA Assay (FDA     approved for NASAL specimens     only), is one component of a     comprehensive MRSA colonization     surveillance program. It is not     intended to diagnose MRSA     infection nor to guide or     monitor treatment for     MRSA infections.  COMPREHENSIVE METABOLIC PANEL     Status: Abnormal   Collection Time    02/03/14  6:25 AM      Result Value Ref Range   Sodium 139  137 - 147 mEq/L   Potassium 4.2  3.7 - 5.3 mEq/L   Chloride 111  96 - 112 mEq/L   CO2 15 (*) 19 - 32 mEq/L   Glucose, Bld 206 (*) 70 - 99 mg/dL   BUN 23  6 - 23 mg/dL   Creatinine, Ser 2.01 (*) 0.50 - 1.10 mg/dL   Calcium 7.4 (*) 8.4 - 10.5  mg/dL   Total Protein 7.4  6.0 - 8.3 g/dL   Albumin 1.5 (*) 3.5 - 5.2 g/dL   AST 47 (*) 0 - 37 U/L   ALT 8  0 - 35 U/L  Alkaline Phosphatase 598 (*) 39 - 117 U/L   Total Bilirubin 2.1 (*) 0.3 - 1.2 mg/dL   GFR calc non Af Amer 27 (*) >90 mL/min   GFR calc Af Amer 31 (*) >90 mL/min   Comment: (NOTE)     The eGFR has been calculated using the CKD EPI equation.     This calculation has not been validated in all clinical situations.     eGFR's persistently <90 mL/min signify possible Chronic Kidney     Disease.   Anion gap 13  5 - 15  TROPONIN I     Status: None   Collection Time    02/03/14  6:25 AM      Result Value Ref Range   Troponin I <0.30  <0.30 ng/mL   Comment:            Due to the release kinetics of cTnI,     a negative result within the first hours     of the onset of symptoms does not rule out     myocardial infarction with certainty.     If myocardial infarction is still suspected,     repeat the test at appropriate intervals.  CULTURE, BLOOD (ROUTINE X 2)     Status: None   Collection Time    02/03/14  6:25 AM      Result Value Ref Range   Specimen Description BLOOD LEFT HAND     Special Requests BOTTLES DRAWN AEROBIC ONLY 3CC     Culture  Setup Time       Value: 02/03/2014 14:16     Performed at [facility]   Culture       Value:        BLOOD CULTURE RECEIVED NO GROWTH TO DATE CULTURE WILL BE HELD FOR 5 DAYS BEFORE ISSUING A FINAL NEGATIVE REPORT     Performed at [facility]   Report Status PENDING    CBC     Status: Abnormal   Collection Time    02/03/14  6:25 AM      Result Value Ref Range   WBC 6.2  4.0 - 10.5 K/uL   RBC 2.44 (*) 3.87 - 5.11 MIL/uL   Hemoglobin 7.8 (*) 12.0 - 15.0 g/dL   HCT 24.8 (*) 36.0 - 46.0 %   MCV 101.6 (*) 78.0 - 100.0 fL   MCH 32.0  26.0 - 34.0 pg   MCHC 31.5  30.0 - 36.0 g/dL   RDW 19.3 (*) 11.5 - 15.5 %   Platelets 185  150 - 400 K/uL  LACTATE DEHYDROGENASE     Status: Abnormal   Collection Time     02/03/14  6:25 AM      Result Value Ref Range   LDH 400 (*) 94 - 250 U/L   Comment: HEMOLYSIS AT THIS LEVEL MAY AFFECT RESULT  CULTURE, BLOOD (ROUTINE X 2)     Status: None   Collection Time    02/03/14  6:35 AM      Result Value Ref Range   Specimen Description BLOOD LEFT WRIST     Special Requests       Value: BOTTLES DRAWN AEROBIC AND ANAEROBIC BLUE 10CC RED 5CC   Culture  Setup Time       Value: 02/03/2014 14:16     Performed at [facility]   Culture       Value:        BLOOD CULTURE RECEIVED NO  GROWTH TO DATE CULTURE WILL BE HELD FOR 5 DAYS BEFORE ISSUING A FINAL NEGATIVE REPORT     Performed at [facility]   Report Status PENDING    CLOSTRIDIUM DIFFICILE BY PCR     Status: Abnormal   Collection Time    02/03/14  6:55 AM      Result Value Ref Range   C difficile by pcr POSITIVE (*) NEGATIVE   Comment: CRITICAL RESULT CALLED TO, READ BACK BY AND VERIFIED WITH:     DECENA RN 11:00 02/03/14 ([email or web address])  GLUCOSE, CAPILLARY     Status: Abnormal   Collection Time    02/03/14  8:12 AM      Result Value Ref Range   Glucose-Capillary 198 (*) 70 - 99 mg/dL  AMMONIA     Status: Abnormal   Collection Time    02/03/14 11:53 AM      Result Value Ref Range   Ammonia 70 (*) 11 - 60 umol/L  COMPREHENSIVE METABOLIC PANEL     Status: Abnormal   Collection Time    02/03/14 12:00 PM      Result Value Ref Range   Sodium 138  137 - 147 mEq/L   Potassium 4.3  3.7 - 5.3 mEq/L   Chloride 111  96 - 112 mEq/L   CO2 15 (*) 19 - 32 mEq/L   Glucose, Bld 291 (*) 70 - 99 mg/dL   BUN 23  6 - 23 mg/dL   Creatinine, Ser 2.02 (*) 0.50 - 1.10 mg/dL   Calcium 7.4 (*) 8.4 - 10.5 mg/dL   Total Protein 6.4  6.0 - 8.3 g/dL   Albumin 1.3 (*) 3.5 - 5.2 g/dL   AST 41 (*) 0 - 37 U/L   ALT 7  0 - 35 U/L   Alkaline Phosphatase 520 (*) 39 - 117 U/L   Total Bilirubin 2.0 (*) 0.3 - 1.2 mg/dL   GFR calc non Af Amer 27 (*) >90 mL/min   GFR calc Af Amer 31 (*) >90 mL/min   Comment: (NOTE)     The  eGFR has been calculated using the CKD EPI equation.     This calculation has not been validated in all clinical situations.     eGFR's persistently <90 mL/min signify possible Chronic Kidney     Disease.   Anion gap 12  5 - 15  GLUCOSE, CAPILLARY     Status: Abnormal   Collection Time    02/03/14 12:32 PM      Result Value Ref Range   Glucose-Capillary 283 (*) 70 - 99 mg/dL  GLUCOSE, CAPILLARY     Status: Abnormal   Collection Time    02/03/14  4:45 PM      Result Value Ref Range   Glucose-Capillary 261 (*) 70 - 99 mg/dL  GLUCOSE, CAPILLARY     Status: Abnormal   Collection Time    02/03/14  9:37 PM      Result Value Ref Range   Glucose-Capillary 132 (*) 70 - 99 mg/dL  BASIC METABOLIC PANEL     Status: Abnormal   Collection Time    02/04/14  5:40 AM      Result Value Ref Range   Sodium 141  137 - 147 mEq/L   Potassium 4.2  3.7 - 5.3 mEq/L   Chloride 111  96 - 112 mEq/L   CO2 16 (*) 19 - 32 mEq/L   Glucose, Bld 192 (*) 70 - 99 mg/dL   BUN  24 (*) 6 - 23 mg/dL   Creatinine, Ser 2.09 (*) 0.50 - 1.10 mg/dL   Calcium 7.1 (*) 8.4 - 10.5 mg/dL   GFR calc non Af Amer 26 (*) >90 mL/min   GFR calc Af Amer 30 (*) >90 mL/min   Comment: (NOTE)     The eGFR has been calculated using the CKD EPI equation.     This calculation has not been validated in all clinical situations.     eGFR's persistently <90 mL/min signify possible Chronic Kidney     Disease.   Anion gap 14  5 - 15  CBC WITH DIFFERENTIAL     Status: Abnormal   Collection Time    02/04/14  5:40 AM      Result Value Ref Range   WBC 6.2  4.0 - 10.5 K/uL   RBC 2.40 (*) 3.87 - 5.11 MIL/uL   Hemoglobin 7.4 (*) 12.0 - 15.0 g/dL   HCT 23.9 (*) 36.0 - 46.0 %   MCV 99.6  78.0 - 100.0 fL   MCH 30.8  26.0 - 34.0 pg   MCHC 31.0  30.0 - 36.0 g/dL   RDW 19.4 (*) 11.5 - 15.5 %   Platelets 184  150 - 400 K/uL   Neutrophils Relative % 75  43 - 77 %   Neutro Abs 4.6  1.7 - 7.7 K/uL   Lymphocytes Relative 15  12 - 46 %   Lymphs Abs  0.9  0.7 - 4.0 K/uL   Monocytes Relative 9  3 - 12 %   Monocytes Absolute 0.6  0.1 - 1.0 K/uL   Eosinophils Relative 1  0 - 5 %   Eosinophils Absolute 0.1  0.0 - 0.7 K/uL   Basophils Relative 0  0 - 1 %   Basophils Absolute 0.0  0.0 - 0.1 K/uL  GLUCOSE, CAPILLARY     Status: Abnormal   Collection Time    02/04/14  8:12 AM      Result Value Ref Range   Glucose-Capillary 180 (*) 70 - 99 mg/dL  GLUCOSE, CAPILLARY     Status: Abnormal   Collection Time    02/04/14 11:58 AM      Result Value Ref Range   Glucose-Capillary 186 (*) 70 - 99 mg/dL  GLUCOSE, CAPILLARY     Status: Abnormal   Collection Time    02/04/14  4:46 PM      Result Value Ref Range   Glucose-Capillary 163 (*) 70 - 99 mg/dL    Dg Chest 2 View  02/03/2014   CLINICAL DATA:  Dyspnea.  Recent history of HC AP  EXAM: CHEST  2 VIEW  COMPARISON:  02/02/2014  FINDINGS: Shallow inspiration with elevation of the right hemidiaphragm. Increasing consolidation or atelectasis in the right lung base with persistent patchy airspace disease in the left mid and upper lung. Probable small right pleural effusion. No pneumothorax. Right central venous catheter is unchanged in position. Normal heart size and pulmonary vascularity.  IMPRESSION: Increasing consolidation and effusion in the right lung base with persistent focal infiltration in the left mid/ upper lung. Changes likely represent multifocal pneumonia.   Electronically Signed   By: Lucienne Capers M.D.   On: 02/03/2014 05:54   US Abdomen Limited  02/03/2014   CLINICAL DATA:  Ascites, abdominal distention  EXAM: LIMITED ABDOMINAL ULTRASOUND  COMPARISON:  CT 01/10/2014  FINDINGS: No significant ascites identified at evaluation of the 4 abdominal quadrants.  IMPRESSION: No ascites identified.  Electronically Signed   By: Conchita Paris M.D.   On: 02/03/2014 15:10    Review of Systems  Constitutional: Positive for chills and malaise/fatigue. Negative for fever and diaphoresis.  HENT:  Negative for ear discharge, ear pain, hearing loss, nosebleeds and tinnitus.   Eyes: Negative.   Respiratory: Positive for cough, shortness of breath and wheezing.   Cardiovascular: Positive for palpitations and leg swelling.  Gastrointestinal: Positive for nausea, abdominal pain and diarrhea.  Genitourinary: Positive for dysuria. Negative for urgency, frequency, hematuria and flank pain.  Musculoskeletal: Positive for back pain. Negative for myalgias and neck pain.  Skin: Positive for itching. Negative for rash.  Neurological: Positive for dizziness, weakness and headaches. Negative for tingling, tremors and speech change.  All other systems reviewed and are negative.  Blood pressure 195/90, pulse 87, temperature 97.8 F (36.6 C), temperature source Oral, resp. rate 29, height 5' 5"  (1.651 m), weight 93.7 kg (206 lb 9.1 oz), SpO2 100.00%. Physical Exam  Nursing note and vitals reviewed. Constitutional: She is oriented to person, place, and time. She appears well-developed and well-nourished. She appears distressed.  Short of breath upon talking sentences  HENT:  Head: Normocephalic and atraumatic.  Nose: Nose normal.  Mouth/Throat: No oropharyngeal exudate.  Eyes: EOM are normal. Pupils are equal, round, and reactive to light.  Neck: Normal range of motion. Neck supple. No JVD present. No thyromegaly present.  Cardiovascular: Normal rate.  Exam reveals no friction rub.   Murmur heard. Ejection systolic murmur over apex  Respiratory: She has rales.  Coarse rales bilaterally  GI: Soft. Bowel sounds are normal. There is tenderness.  Tender over both lower quadrants  Musculoskeletal:  Trace lower extremity edema  Neurological: She is alert and oriented to person, place, and time.  Skin: Skin is warm and dry. No rash noted. No erythema.    Assessment/Plan: 1. Resistant hypertension: Unclear compliance prior to admission and recently started medications with sluggish response.  Fortunately without any indication of hypertensive emergency. Agree with avoiding RAS blocking agents and diuretics at this point given that she has diarrhea and minimal evidence of volume overload (trace edema). Recommend starting doxazosin 4 mg each bedtime tonight. Indeed the blood pressure is elevated, but there does not appear to be acute need for reduction. Defer restarting diuretic/ACE inhibitor until after recovered from her diarrheal illness. May use when necessary oral clonidine or intravenous labetalol if oral intake felt unsafe for systolic blood pressures greater than 180. She has possible allergy to amlodipine-would consider a non-dihydropyridine calcium channel blocker (while closely monitoring heart rate due to negative chronotropic effect) as an alternative antihypertensive agents. 2. Chronic kidney disease stage IV: Renal function remains relatively stable, continues to have metabolic acidosis and has been restarted on oral sodium bicarbonate therapy. 3. Shortness of breath: Suspected recurrent pneumonia, appreciate infectious disease consult/guidance in management. 4. Clostridium difficile colitis: Ongoing treatment with oral metronidazole 5. Anemia: Possibly secondary to chronic kidney disease and recent acute illness/hospitalizations, defer ESA therapy until after blood pressure is controlled  Braycen Burandt K. 02/04/2014, 6:24 PM

## 2014-02-05 ENCOUNTER — Inpatient Hospital Stay (HOSPITAL_COMMUNITY): Payer: Medicaid Other

## 2014-02-05 DIAGNOSIS — E43 Unspecified severe protein-calorie malnutrition: Secondary | ICD-10-CM

## 2014-02-05 LAB — CBC
HCT: 23.4 % — ABNORMAL LOW (ref 36.0–46.0)
Hemoglobin: 7.2 g/dL — ABNORMAL LOW (ref 12.0–15.0)
MCH: 30.5 pg (ref 26.0–34.0)
MCHC: 30.8 g/dL (ref 30.0–36.0)
MCV: 99.2 fL (ref 78.0–100.0)
Platelets: 168 10*3/uL (ref 150–400)
RBC: 2.36 MIL/uL — AB (ref 3.87–5.11)
RDW: 19.4 % — ABNORMAL HIGH (ref 11.5–15.5)
WBC: 4.1 10*3/uL (ref 4.0–10.5)

## 2014-02-05 LAB — GLUCOSE, CAPILLARY
Glucose-Capillary: 160 mg/dL — ABNORMAL HIGH (ref 70–99)
Glucose-Capillary: 192 mg/dL — ABNORMAL HIGH (ref 70–99)
Glucose-Capillary: 192 mg/dL — ABNORMAL HIGH (ref 70–99)
Glucose-Capillary: 215 mg/dL — ABNORMAL HIGH (ref 70–99)

## 2014-02-05 LAB — BASIC METABOLIC PANEL
ANION GAP: 14 (ref 5–15)
BUN: 22 mg/dL (ref 6–23)
CO2: 16 mEq/L — ABNORMAL LOW (ref 19–32)
Calcium: 7.1 mg/dL — ABNORMAL LOW (ref 8.4–10.5)
Chloride: 112 mEq/L (ref 96–112)
Creatinine, Ser: 2.19 mg/dL — ABNORMAL HIGH (ref 0.50–1.10)
GFR, EST AFRICAN AMERICAN: 28 mL/min — AB (ref >90)
GFR, EST NON AFRICAN AMERICAN: 24 mL/min — AB (ref >90)
Glucose, Bld: 147 mg/dL — ABNORMAL HIGH (ref 70–99)
POTASSIUM: 4 meq/L (ref 3.7–5.3)
Sodium: 142 mEq/L (ref 137–147)

## 2014-02-05 LAB — T-HELPER CELLS (CD4) COUNT (NOT AT ARMC)
CD4 T CELL HELPER: 25 % — AB (ref 33–55)
CD4 T Cell Abs: 240 /uL — ABNORMAL LOW (ref 400–2700)

## 2014-02-05 MED ORDER — CLONIDINE HCL 0.2 MG PO TABS
0.2000 mg | ORAL_TABLET | Freq: Four times a day (QID) | ORAL | Status: DC | PRN
  Administered 2014-02-05: 0.2 mg via ORAL
  Filled 2014-02-05: qty 1

## 2014-02-05 MED ORDER — FUROSEMIDE 80 MG PO TABS
80.0000 mg | ORAL_TABLET | Freq: Two times a day (BID) | ORAL | Status: DC
  Administered 2014-02-05 – 2014-02-07 (×5): 80 mg via ORAL
  Filled 2014-02-05 (×4): qty 1
  Filled 2014-02-05: qty 2
  Filled 2014-02-05 (×3): qty 1

## 2014-02-05 NOTE — Progress Notes (Signed)
Internal Medicine Attending  Date: 02/05/2014  Patient name: Michelle [name] Medical record number: [id number] Date of birth: 1960/12/02 Age: 53 y.o. Gender: female  I saw and evaluated the patient. I discussed patient and reviewed the resident's note by Dr. Isabella Bowens, and I agree with the resident's findings and plans as documented in her note.

## 2014-02-05 NOTE — Progress Notes (Signed)
Nutrition Brief Note  Patient identified on the Malnutrition Screening Tool (MST) Report  Wt Readings from Last 15 Encounters:  02/05/14 207 lb 7.3 oz (94.1 kg)  01/20/14 190 lb 11.2 oz (86.5 kg)  01/15/14 179 lb 14.3 oz (81.6 kg)  01/15/14 179 lb 14.3 oz (81.6 kg)  01/15/14 179 lb 14.3 oz (81.6 kg)  12/24/13 195 lb (88.451 kg)  12/20/13 194 lb 12.8 oz (88.361 kg)  12/20/13 194 lb 12.8 oz (88.361 kg)  12/16/13 195 lb 15.8 oz (88.9 kg)  12/13/13 211 lb 3.2 oz (95.8 kg)  12/03/13 182 lb (82.555 kg)  11/13/13 195 lb (88.451 kg)  11/08/13 195 lb 9.6 oz (88.724 kg)  11/01/13 205 lb 11.2 oz (93.305 kg)  10/25/13 210 lb 6.4 oz (95.437 kg)    Body mass index is 34.52 kg/(m^2). Patient meets criteria for obesity based on current BMI.   Per RN, pt is consuming "110%" of meals. Weight change is likely d/t fluid accumulation.  Current diet order is Renal/carb modified with 1200 ml fluid restriction, patient is consuming approximately 100% of meals at this time. Labs and medications reviewed.   No nutrition interventions warranted at this time. If nutrition issues arise, please consult RD.   Tilda Franco, MS, PLDN Provisionally Licensed Dietitian Nutritionist Pager: [phone]

## 2014-02-05 NOTE — Progress Notes (Signed)
Subjective:  BP slightly better from yesterday- current regimen clonidine #3 patch/cardura 4 q HS/hydralazine 100 TID/labetalol 700 BID- did not get any PRN meds last 24 hours for BP - C diff is positive- she is cleaning her plate this AM  Objective Vital signs in last 24 hours: Filed Vitals:   02/05/14 0300 02/05/14 0400 02/05/14 0500 02/05/14 0600  BP: 172/74 169/84  189/80  Pulse: 77 85 75 74  Temp: 98.2 F (36.8 C)     TempSrc: Oral     Resp: 21 20 21 21   Height:      Weight: 94.1 kg (207 lb 7.3 oz)     SpO2: 100% 100% 100% 100%   Weight change: 0.4 kg (14.1 oz)  Intake/Output Summary (Last 24 hours) at 02/05/14 0811 Last data filed at 02/05/14 0530  Gross per 24 hour  Intake   1000 ml  Output    350 ml  Net    650 ml    Assessment/Plan: 53 year old BF with FSG and stage 3/4 CKD with questionable compliance and multiple hospitalizations with high BP 1. Resistant hypertension: Unclear compliance prior to admission and sluggish response.  See current regimen above.  It takes a little while for the clonidine patch to "kick in". Fortunately without any indication of hypertensive emergency. Agree with avoiding RAS blocking agents.   Have starting doxazosin 4 mg each bedtime. May still  use when necessary oral clonidine  for systolic blood pressures greater than 180.  She actually does have pitting edema so will add on some lasix 2. Chronic kidney disease stage IV: Renal function remains relatively stable, continues to have metabolic acidosis and has been restarted on oral sodium bicarbonate therapy.  3. Shortness of breath: Suspected recurrent pneumonia, appreciate infectious disease consult/guidance in management.  4. Clostridium difficile colitis: Ongoing treatment with oral metronidazole  5. Anemia: Possibly secondary to chronic kidney disease and recent acute illness/hospitalizations, defer ESA therapy until after blood pressure is controlled    Michelle [name]  A    Labs: Basic Metabolic Panel:  Recent Labs Lab 02/03/14 1200 02/04/14 0540 02/05/14 0500  NA 138 141 142  K 4.3 4.2 4.0  CL 111 111 112  CO2 15* 16* 16*  GLUCOSE 291* 192* 147*  BUN 23 24* 22  CREATININE 2.02* 2.09* 2.19*  CALCIUM 7.4* 7.1* 7.1*   Liver Function Tests:  Recent Labs Lab 02/03/14 0625 02/03/14 1200  AST 47* 41*  ALT 8 7  ALKPHOS 598* 520*  BILITOT 2.1* 2.0*  PROT 7.4 6.4  ALBUMIN 1.5* 1.3*   No results found for this basename: LIPASE, AMYLASE,  in the last 168 hours  Recent Labs Lab 02/03/14 1153  AMMONIA 70*   CBC:  Recent Labs Lab 02/03/14 0625 02/04/14 0540 02/05/14 0500  WBC 6.2 6.2 4.1  NEUTROABS  --  4.6  --   HGB 7.8* 7.4* 7.2*  HCT 24.8* 23.9* 23.4*  MCV 101.6* 99.6 99.2  PLT 185 184 168   Cardiac Enzymes:  Recent Labs Lab 02/03/14 0625  TROPONINI <0.30   CBG:  Recent Labs Lab 02/03/14 2137 02/04/14 0812 02/04/14 1158 02/04/14 1646 02/04/14 2134  GLUCAP 132* 180* 186* 163* 133*    Iron Studies: No results found for this basename: IRON, TIBC, TRANSFERRIN, FERRITIN,  in the last 72 hours Studies/Results: US Abdomen Limited  02/03/2014   CLINICAL DATA:  Ascites, abdominal distention  EXAM: LIMITED ABDOMINAL ULTRASOUND  COMPARISON:  CT 01/10/2014  FINDINGS: No significant ascites identified  at evaluation of the 4 abdominal quadrants.  IMPRESSION: No ascites identified.   Electronically Signed   By: Christiana Pellant M.D.   On: 02/03/2014 15:10   Medications: Infusions:    Scheduled Medications: . abacavir  600 mg Oral Daily  . acyclovir  800 mg Oral Daily  . aspirin EC  81 mg Oral Daily  . cloNIDine  0.3 mg Transdermal Weekly  . Darunavir Ethanolate  800 mg Oral Q breakfast  . doxazosin  4 mg Oral QHS  . heparin  5,000 Units Subcutaneous 3 times per day  . hydrALAZINE  100 mg Oral 3 times per day  . hydrocerin  1 application Topical BID  . insulin aspart  0-15 Units Subcutaneous TID WC  . insulin aspart   0-5 Units Subcutaneous QHS  . labetalol  700 mg Oral BID  . lacosamide  150 mg Oral BID  . lactulose  20 g Oral TID  . lamiVUDine  150 mg Oral Daily  . levETIRAcetam  1,500 mg Oral BID  . metroNIDAZOLE  500 mg Oral 3 times per day  . pantoprazole  40 mg Oral Daily  . piperacillin-tazobactam (ZOSYN)  IV  3.375 g Intravenous 3 times per day  . ritonavir  100 mg Oral Q breakfast  . sodium bicarbonate  650 mg Oral BID  . sodium chloride  3 mL Intravenous Q12H  . vancomycin  1,250 mg Intravenous Q24H    have reviewed scheduled and prn medications.  Physical Exam: General: alert, eating like a champ Heart: RRR Lungs: mostly clear Abdomen: soft, non tender Extremities: pitting edema    02/05/2014,8:11 AM  LOS: 2 days

## 2014-02-05 NOTE — Progress Notes (Signed)
Note/chart reviewed. Agree with note.   Jakeisha Stricker RD, LDN, CNSC [phone] Pager [phone] After Hours Pager   

## 2014-02-05 NOTE — Progress Notes (Signed)
INFECTIOUS DISEASE PROGRESS NOTE  ID: Michelle [name] is a 53 y.o. female with  Principal Problem:   HCAP (healthcare-associated pneumonia) Active Problems:   Unspecified essential hypertension   HIV disease   CKD (chronic kidney disease) stage 4, GFR 15-29 ml/min   DM2 (diabetes mellitus, type 2)   Anemia of chronic disease   Unspecified protein-calorie malnutrition   Pruritus   Intermittent confusion   Acute diastolic congestive heart failure   Metabolic acidosis, increased anion gap   Elevated alkaline phosphatase level   Ascites   Dyspnea   C. difficile diarrhea   Uncontrolled hypertension  Subjective: Tired.   Abtx:  Anti-infectives   Start     Dose/Rate Route Frequency Ordered Stop   02/04/14 1000  azithromycin (ZITHROMAX) tablet 500 mg  Status:  Discontinued     500 mg Oral Daily 02/03/14 1053 02/03/14 1312   02/04/14 1000  lamiVUDine (EPIVIR) 10 MG/ML solution 150 mg  Status:  Discontinued     150 mg Oral Daily 02/04/14 0728 02/04/14 0813   02/04/14 1000  lamiVUDine (EPIVIR) tablet 150 mg     150 mg Oral Daily 02/04/14 0813     02/04/14 0600  vancomycin (VANCOCIN) IVPB 750 mg/150 ml premix  Status:  Discontinued     750 mg 150 mL/hr over 60 Minutes Intravenous Every 24 hours 02/03/14 0657 02/03/14 0858   02/04/14 0600  vancomycin (VANCOCIN) 1,250 mg in sodium chloride 0.9 % 250 mL IVPB     1,250 mg 166.7 mL/hr over 90 Minutes Intravenous Every 24 hours 02/03/14 0858     02/03/14 1130  metroNIDAZOLE (FLAGYL) tablet 500 mg     500 mg Oral 3 times per day 02/03/14 1124     02/03/14 1000  lamiVUDine (EPIVIR) 10 MG/ML solution 100 mg  Status:  Discontinued     100 mg Oral Daily 02/03/14 0440 02/04/14 0728   02/03/14 1000  abacavir (ZIAGEN) tablet 600 mg     600 mg Oral Daily 02/03/14 0440     02/03/14 1000  acyclovir (ZOVIRAX) tablet 800 mg     800 mg Oral Daily 02/03/14 0440     02/03/14 1000  azithromycin (ZITHROMAX) tablet 500 mg  Status:  Discontinued     500  mg Oral Daily 02/03/14 0632 02/03/14 1046   02/03/14 0800  Darunavir Ethanolate (PREZISTA) tablet 800 mg     800 mg Oral Daily with breakfast 02/03/14 0440     02/03/14 0800  ritonavir (NORVIR) capsule 100 mg     100 mg Oral Daily with breakfast 02/03/14 0440     02/03/14 0800  vancomycin (VANCOCIN) 1,500 mg in sodium chloride 0.9 % 500 mL IVPB     1,500 mg 250 mL/hr over 120 Minutes Intravenous  Once 02/03/14 0657 02/03/14 1517   02/03/14 0730  piperacillin-tazobactam (ZOSYN) IVPB 3.375 g     3.375 g 12.5 mL/hr over 240 Minutes Intravenous 3 times per day 02/03/14 0657        Medications:  Scheduled: . abacavir  600 mg Oral Daily  . acyclovir  800 mg Oral Daily  . aspirin EC  81 mg Oral Daily  . cloNIDine  0.3 mg Transdermal Weekly  . Darunavir Ethanolate  800 mg Oral Q breakfast  . doxazosin  4 mg Oral QHS  . furosemide  80 mg Oral BID  . heparin  5,000 Units Subcutaneous 3 times per day  . hydrALAZINE  100 mg Oral 3 times per day  .  hydrocerin  1 application Topical BID  . insulin aspart  0-15 Units Subcutaneous TID WC  . insulin aspart  0-5 Units Subcutaneous QHS  . labetalol  700 mg Oral BID  . lacosamide  150 mg Oral BID  . lamiVUDine  150 mg Oral Daily  . levETIRAcetam  1,500 mg Oral BID  . metroNIDAZOLE  500 mg Oral 3 times per day  . pantoprazole  40 mg Oral Daily  . piperacillin-tazobactam (ZOSYN)  IV  3.375 g Intravenous 3 times per day  . ritonavir  100 mg Oral Q breakfast  . sodium bicarbonate  650 mg Oral BID  . sodium chloride  3 mL Intravenous Q12H  . vancomycin  1,250 mg Intravenous Q24H    Objective: Vital signs in last 24 hours: Temp:  [97.5 F (36.4 C)-98.3 F (36.8 C)] 98 F (36.7 C) (09/09 1207) Pulse Rate:  [74-90] 88 (09/09 1207) Resp:  [14-34] 34 (09/09 1207) BP: (133-224)/(69-106) 196/91 mmHg (09/09 1207) SpO2:  [100 %] 100 % (09/09 1207) Weight:  [94.1 kg (207 lb 7.3 oz)] 94.1 kg (207 lb 7.3 oz) (09/09 0300)   General appearance:  fatigued Resp: rhonchi bilaterally Cardio: regular rate and rhythm GI: normal findings: bowel sounds normal and soft, non-tender  Lab Results  Recent Labs  02/04/14 0540 02/05/14 0500  WBC 6.2 4.1  HGB 7.4* 7.2*  HCT 23.9* 23.4*  NA 141 142  K 4.2 4.0  CL 111 112  CO2 16* 16*  BUN 24* 22  CREATININE 2.09* 2.19*   Liver Panel  Recent Labs  02/03/14 0625 02/03/14 1200  PROT 7.4 6.4  ALBUMIN 1.5* 1.3*  AST 47* 41*  ALT 8 7  ALKPHOS 598* 520*  BILITOT 2.1* 2.0*   Sedimentation Rate No results found for this basename: ESRSEDRATE,  in the last 72 hours C-Reactive Protein No results found for this basename: CRP,  in the last 72 hours  Microbiology: Recent Results (from the past 240 hour(s))  MRSA PCR SCREENING     Status: None   Collection Time    02/03/14  3:24 AM      Result Value Ref Range Status   MRSA by PCR NEGATIVE  NEGATIVE Final   Comment:            The GeneXpert MRSA Assay (FDA     approved for NASAL specimens     only), is one component of a     comprehensive MRSA colonization     surveillance program. It is not     intended to diagnose MRSA     infection nor to guide or     monitor treatment for     MRSA infections.  CULTURE, BLOOD (ROUTINE X 2)     Status: None   Collection Time    02/03/14  6:25 AM      Result Value Ref Range Status   Specimen Description BLOOD LEFT HAND   Final   Special Requests BOTTLES DRAWN AEROBIC ONLY 3CC   Final   Culture  Setup Time     Final   Value: 02/03/2014 14:16     Performed at [facility]   Culture     Final   Value:        BLOOD CULTURE RECEIVED NO GROWTH TO DATE CULTURE WILL BE HELD FOR 5 DAYS BEFORE ISSUING A FINAL NEGATIVE REPORT     Performed at [facility]   Report Status PENDING   Incomplete  CULTURE,  BLOOD (ROUTINE X 2)     Status: None   Collection Time    02/03/14  6:35 AM      Result Value Ref Range Status   Specimen Description BLOOD LEFT WRIST   Final   Special Requests      Final   Value: BOTTLES DRAWN AEROBIC AND ANAEROBIC BLUE 10CC RED 5CC   Culture  Setup Time     Final   Value: 02/03/2014 14:16     Performed at [facility]   Culture     Final   Value:        BLOOD CULTURE RECEIVED NO GROWTH TO DATE CULTURE WILL BE HELD FOR 5 DAYS BEFORE ISSUING A FINAL NEGATIVE REPORT     Performed at [facility]   Report Status PENDING   Incomplete  CLOSTRIDIUM DIFFICILE BY PCR     Status: Abnormal   Collection Time    02/03/14  6:55 AM      Result Value Ref Range Status   C difficile by pcr POSITIVE (*) NEGATIVE Final   Comment: CRITICAL RESULT CALLED TO, READ BACK BY AND VERIFIED WITH:     DECENA RN 11:00 02/03/14 ([email or web address])    Studies/Results: US Abdomen Limited  02/03/2014   CLINICAL DATA:  Ascites, abdominal distention  EXAM: LIMITED ABDOMINAL ULTRASOUND  COMPARISON:  CT 01/10/2014  FINDINGS: No significant ascites identified at evaluation of the 4 abdominal quadrants.  IMPRESSION: No ascites identified.   Electronically Signed   By: Christiana Pellant M.D.   On: 02/03/2014 15:10     Assessment/Plan: Pneumonia  HIV+  ESRD  C diff+  DM2  Hepatitis C Severe Protein Calorie Malnutrition  Total days of antibiotics: 2 (vanco/zosyn/flagyl) No change in her anbx Less stool today.  CD4 is stable.  Consider repeat CXR         Johny Sax Infectious Diseases (pager) [phone] [email or web address] 02/05/2014, 1:58 PM  LOS: 2 days

## 2014-02-05 NOTE — Progress Notes (Signed)
Subjective: BP improved last night with addition of doxazosin. Reports her breathing has improved today. Complaining of increased edema in her thighs and some R hip pain attributed to lying in the bed. Reports some chest pain last night similar to previous chest pain.  Objective: Vital signs in last 24 hours: Filed Vitals:   02/05/14 0500 02/05/14 0600 02/05/14 0800 02/05/14 1000  BP:  189/80 177/87 157/84  Pulse: 75 74 90 87  Temp:   98 F (36.7 C)   TempSrc:   Oral   Resp: 21 21 28 28   Height:      Weight:      SpO2: 100% 100% 100% 100%    Weight change: 14.1 oz (0.4 kg)  Intake/Output Summary (Last 24 hours) at 02/05/14 1101 Last data filed at 02/05/14 1005  Gross per 24 hour  Intake   1240 ml  Output   1150 ml  Net     90 ml   General: lying in bed HEENT: PERRL, EOMI, scleral icterus  Cardiac: RRR, no rubs, murmurs or gallops Chest: right IJ tunneled PICC line in right upper chest, clean dry and intact without tenderness or erythema  Pulm: right lung base rales, clear to auscultation otherwise, breathing comfortably on 2L [place]  Abd: soft, mildly TTP diffusely, BS present Ext: warm and well perfused, trace bilateral edema  Neuro: Awake and alert, oriented X3, cranial nerves II-XII grossly intact, oriented to self, time, place  Lab Results: Basic Metabolic Panel:  Recent Labs Lab 02/04/14 0540 02/05/14 0500  NA 141 142  K 4.2 4.0  CL 111 112  CO2 16* 16*  GLUCOSE 192* 147*  BUN 24* 22  CREATININE 2.09* 2.19*  CALCIUM 7.1* 7.1*   Liver Function Tests:  Recent Labs Lab 02/03/14 0625 02/03/14 1200  AST 47* 41*  ALT 8 7  ALKPHOS 598* 520*  BILITOT 2.1* 2.0*  PROT 7.4 6.4  ALBUMIN 1.5* 1.3*   CBC: OSH WBC 8.4  Hemoglobin 8.0  Hematocrit 24.9  MCV 102  Platelets 194  Recent Labs Lab 02/04/14 0540 02/05/14 0500  WBC 6.2 4.1  NEUTROABS 4.6  --   HGB 7.4* 7.2*  HCT 23.9* 23.4*  MCV 99.6 99.2  PLT 184 168   Cardiac Enzymes:  Recent  Labs Lab 02/03/14 0625  TROPONINI <0.30   BNP: OSH: ProBNP 7540 (highest prior 1277 on 10/04/2013)  CBG:  Recent Labs Lab 02/03/14 2137 02/04/14 0812 02/04/14 1158 02/04/14 1646 02/04/14 2134 02/05/14 0838  GLUCAP 132* 180* 186* 163* 133* 160*   Coagulation: OSH: INR 1  Urinalysis: OSH: Few bacteria  WBC 5 to 10  Misc. Labs: LDH 02/03/2014 400  Micro Results: Recent Results (from the past 240 hour(s))  MRSA PCR SCREENING     Status: None   Collection Time    02/03/14  3:24 AM      Result Value Ref Range Status   MRSA by PCR NEGATIVE  NEGATIVE Final   Comment:            The GeneXpert MRSA Assay (FDA     approved for NASAL specimens     only), is one component of a     comprehensive MRSA colonization     surveillance program. It is not     intended to diagnose MRSA     infection nor to guide or     monitor treatment for     MRSA infections.  CULTURE, BLOOD (ROUTINE X 2)  Status: None   Collection Time    02/03/14  6:25 AM      Result Value Ref Range Status   Specimen Description BLOOD LEFT HAND   Final   Special Requests BOTTLES DRAWN AEROBIC ONLY 3CC   Final   Culture  Setup Time     Final   Value: 02/03/2014 14:16     Performed at [facility]   Culture     Final   Value:        BLOOD CULTURE RECEIVED NO GROWTH TO DATE CULTURE WILL BE HELD FOR 5 DAYS BEFORE ISSUING A FINAL NEGATIVE REPORT     Performed at [facility]   Report Status PENDING   Incomplete  CULTURE, BLOOD (ROUTINE X 2)     Status: None   Collection Time    02/03/14  6:35 AM      Result Value Ref Range Status   Specimen Description BLOOD LEFT WRIST   Final   Special Requests     Final   Value: BOTTLES DRAWN AEROBIC AND ANAEROBIC BLUE 10CC RED 5CC   Culture  Setup Time     Final   Value: 02/03/2014 14:16     Performed at [facility]   Culture     Final   Value:        BLOOD CULTURE RECEIVED NO GROWTH TO DATE CULTURE WILL BE HELD FOR 5 DAYS BEFORE ISSUING A  FINAL NEGATIVE REPORT     Performed at [facility]   Report Status PENDING   Incomplete  CLOSTRIDIUM DIFFICILE BY PCR     Status: Abnormal   Collection Time    02/03/14  6:55 AM      Result Value Ref Range Status   C difficile by pcr POSITIVE (*) NEGATIVE Final   Comment: CRITICAL RESULT CALLED TO, READ BACK BY AND VERIFIED WITH:     DECENA RN 11:00 02/03/14 ([email or web address])   Studies/Results: US Abdomen Limited  02/03/2014   CLINICAL DATA:  Ascites, abdominal distention  EXAM: LIMITED ABDOMINAL ULTRASOUND  COMPARISON:  CT 01/10/2014  FINDINGS: No significant ascites identified at evaluation of the 4 abdominal quadrants.  IMPRESSION: No ascites identified.   Electronically Signed   By: Christiana Pellant M.D.   On: 02/03/2014 15:10   Medications: I have reviewed the patient's current medications. Scheduled Meds: . abacavir  600 mg Oral Daily  . acyclovir  800 mg Oral Daily  . aspirin EC  81 mg Oral Daily  . cloNIDine  0.3 mg Transdermal Weekly  . Darunavir Ethanolate  800 mg Oral Q breakfast  . doxazosin  4 mg Oral QHS  . furosemide  80 mg Oral BID  . heparin  5,000 Units Subcutaneous 3 times per day  . hydrALAZINE  100 mg Oral 3 times per day  . hydrocerin  1 application Topical BID  . insulin aspart  0-15 Units Subcutaneous TID WC  . insulin aspart  0-5 Units Subcutaneous QHS  . labetalol  700 mg Oral BID  . lacosamide  150 mg Oral BID  . lamiVUDine  150 mg Oral Daily  . levETIRAcetam  1,500 mg Oral BID  . metroNIDAZOLE  500 mg Oral 3 times per day  . pantoprazole  40 mg Oral Daily  . piperacillin-tazobactam (ZOSYN)  IV  3.375 g Intravenous 3 times per day  . ritonavir  100 mg Oral Q breakfast  . sodium bicarbonate  650 mg Oral BID  . sodium chloride  3 mL Intravenous Q12H  . vancomycin  1,250 mg Intravenous Q24H   Continuous Infusions:  PRN Meds:.acetaminophen, cloNIDine, hydrOXYzine, sodium chloride Assessment/Plan: Principal Problem:   HCAP (healthcare-associated  pneumonia) Active Problems:   Unspecified essential hypertension   HIV disease   CKD (chronic kidney disease) stage 4, GFR 15-29 ml/min   DM2 (diabetes mellitus, type 2)   Anemia of chronic disease   Unspecified protein-calorie malnutrition   Pruritus   Intermittent confusion   Acute diastolic congestive heart failure   Metabolic acidosis, increased anion gap   Elevated alkaline phosphatase level   Ascites   Dyspnea   C. difficile diarrhea   Uncontrolled hypertension  Dyspnea: Last echo August 2015 ejection fraction of 60-65% and grade 1 diastolic dysfunction. CXR 2 view done here 02/03/2014 shows increasing consolidation and effusion in the right lung base with persistent focal infiltration in the left mid/ upper lung, likely representing multifocal pneumonia. Patient is afebrile and there is no leukocytosis, satting well on room air. Acute on chronic diastolic dysfunction congestive heart versus HCAP (HCAP risk factors include being hospitalized for 2 or more days within the past 90 days (discharged on 8/28), being a resident of a nursing home, and being recipient of recent intravenous antibiotic therapy.) Received Lasix  x 1 02/03/2014. LE venous duplex limited but negative for DVT. ID consulted. Blood cultures NGTD. - monitor urine output, daily weight - legionella antigen pending - continue IV vancomycin, zosyn (day 3)  C Diff: positive on PCR, no past C diff in chart -Flagyl PO  TID (day 2)  Hypertension: BP better controlled. 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 Ammonia level 70 on 02/03/2914. US abdomen with no significant ascites. - hydroxyzine   Q6hr prn itching - hold home lactulose while increased BM 2/2 c diff  Chest pain: troponin negative x 2, EKG with T wave inversion in V1, unchanged from previous otherwise -continue to monitor  Non anion gap metabolic acidosis: Anion gap of 14 with CO2 up to 16. Lactic acid 1.2 at OSH. Chronic, likely due to chronic kidney disease.   - Continue to monitor   Anemia of chronic disease: Hemoglobin 7.4 - Continue to monitor   Type 2 diabetes mellitus: Last A1c was 7.5, 3 months ago.  - sliding scale insulin, moderate.   Chronic kidney disease stage IV: Creatinine 2.1, at prior baseline.  - continue home sodium bicarbonate 650 mg twice a day   HIV (CD4: 240, VL <20 In August 2015)  - continue home lamivudine, ritonavir, darunavir, and abacavir  - continue acyclovir for prophylaxis  - recheck HIV VL and CD4 count  Seizure disorder  - Continue home Keppra and lacosamide.   Dispo: Disposition is deferred at this time, awaiting improvement of current medical problems.  Anticipated discharge in approximately 1 day(s).   The patient does have a current PCP Christen Bame, MD) and does need an [facility] hospital follow-up appointment after discharge.  The patient does not have transportation limitations that hinder transportation to clinic appointments.  .Services Needed at time of discharge: Y = Yes, Blank = No PT:   OT:   RN:   Equipment:  Other:     LOS: 2 days   Griffin Basil, MD 02/05/2014, 11:01 AM

## 2014-02-06 DIAGNOSIS — I120 Hypertensive chronic kidney disease with stage 5 chronic kidney disease or end stage renal disease: Secondary | ICD-10-CM

## 2014-02-06 LAB — URINE MICROSCOPIC-ADD ON

## 2014-02-06 LAB — RENAL FUNCTION PANEL
ALBUMIN: 1.2 g/dL — AB (ref 3.5–5.2)
Anion gap: 14 (ref 5–15)
BUN: 23 mg/dL (ref 6–23)
CALCIUM: 7.2 mg/dL — AB (ref 8.4–10.5)
CO2: 15 mEq/L — ABNORMAL LOW (ref 19–32)
CREATININE: 2.32 mg/dL — AB (ref 0.50–1.10)
Chloride: 113 mEq/L — ABNORMAL HIGH (ref 96–112)
GFR calc non Af Amer: 23 mL/min — ABNORMAL LOW (ref >90)
GFR, EST AFRICAN AMERICAN: 26 mL/min — AB (ref >90)
Glucose, Bld: 177 mg/dL — ABNORMAL HIGH (ref 70–99)
Phosphorus: 4.1 mg/dL (ref 2.3–4.6)
Potassium: 3.8 mEq/L (ref 3.7–5.3)
SODIUM: 142 meq/L (ref 137–147)

## 2014-02-06 LAB — URINALYSIS, ROUTINE W REFLEX MICROSCOPIC
Glucose, UA: 250 mg/dL — AB
HGB URINE DIPSTICK: NEGATIVE
KETONES UR: NEGATIVE mg/dL
Leukocytes, UA: NEGATIVE
Nitrite: NEGATIVE
Protein, ur: >300 mg/dL — AB
Specific Gravity, Urine: 1.019 (ref 1.005–1.030)
UROBILINOGEN UA: 0.2 mg/dL (ref 0.0–1.0)
pH: 5.5 (ref 5.0–8.0)

## 2014-02-06 LAB — CBC
HCT: 22.9 % — ABNORMAL LOW (ref 36.0–46.0)
Hemoglobin: 7.1 g/dL — ABNORMAL LOW (ref 12.0–15.0)
MCH: 30.7 pg (ref 26.0–34.0)
MCHC: 31 g/dL (ref 30.0–36.0)
MCV: 99.1 fL (ref 78.0–100.0)
PLATELETS: 178 10*3/uL (ref 150–400)
RBC: 2.31 MIL/uL — ABNORMAL LOW (ref 3.87–5.11)
RDW: 19.5 % — ABNORMAL HIGH (ref 11.5–15.5)
WBC: 5 10*3/uL (ref 4.0–10.5)

## 2014-02-06 LAB — GLUCOSE, CAPILLARY
GLUCOSE-CAPILLARY: 244 mg/dL — AB (ref 70–99)
GLUCOSE-CAPILLARY: 176 mg/dL — AB (ref 70–99)
Glucose-Capillary: 211 mg/dL — ABNORMAL HIGH (ref 70–99)
Glucose-Capillary: 193 mg/dL — ABNORMAL HIGH (ref 70–99)

## 2014-02-06 LAB — HIV-1 RNA QUANT-NO REFLEX-BLD: HIV-1 RNA Quant, Log: <1.3 {Log} (ref <1.30)

## 2014-02-06 MED ORDER — DARBEPOETIN ALFA-POLYSORBATE 100 MCG/0.5ML IJ SOLN
100.0000 ug | INTRAMUSCULAR | Status: DC
  Filled 2014-02-06: qty 0.5

## 2014-02-06 NOTE — Progress Notes (Signed)
Subjective:  BP much better - current regimen clonidine #3 patch/cardura 4 q HS/hydralazine 100 TID/labetalol 700 BID and lasix 80 BID- did not get any PRN meds last 24 hours for BP -   Objective Vital signs in last 24 hours: Filed Vitals:   02/06/14 0140 02/06/14 0415 02/06/14 0624 02/06/14 0938  BP:  143/68 165/73 148/72  Pulse:  86  84  Temp:  98.4 F (36.9 C)    TempSrc:  Oral    Resp:  20    Height:      Weight: 94.257 kg (207 lb 12.8 oz) 95 kg (209 lb 7 oz)    SpO2:  100%     Weight change: 0.8 kg (1 lb 12.2 oz)  Intake/Output Summary (Last 24 hours) at 02/06/14 1106 Last data filed at 02/06/14 0956  Gross per 24 hour  Intake    880 ml  Output   1051 ml  Net   -171 ml    Assessment/Plan: 53 year old BF with FSG and stage 3/4 CKD with questionable compliance and multiple hospitalizations with high BP 1. Resistant hypertension: Unclear compliance prior to admission and sluggish response.  See current regimen above. Seems to be much better controlled. Fortunately without any indication of hypertensive emergency. Agree with avoiding RAS blocking agents.   Have starting doxazosin 4 mg each bedtime and lasix 80 BID. If need to taper down meds later in hosp , I would start with cardura and labetalol 2. Chronic kidney disease stage IV: Renal function remains relatively stable, continues to have metabolic acidosis and has been restarted on oral sodium bicarbonate therapy.  3. Shortness of breath: Suspected recurrent pneumonia, appreciate infectious disease consult/guidance in management.  4. Clostridium difficile colitis: Ongoing treatment with oral metronidazole  5. Anemia: Possibly secondary to chronic kidney disease and recent acute illness/hospitalizations, will resume her aranesp- transfuse as needed  Renal will sign off- call with any further questions- she will  follow up at [facility] after discharge  Latron Ribas A    Labs: Basic Metabolic Panel:  Recent Labs Lab  02/04/14 0540 02/05/14 0500 02/06/14 0500  NA 141 142 142  K 4.2 4.0 3.8  CL 111 112 113*  CO2 16* 16* 15*  GLUCOSE 192* 147* 177*  BUN 24* 22 23  CREATININE 2.09* 2.19* 2.32*  CALCIUM 7.1* 7.1* 7.2*  PHOS  --   --  4.1   Liver Function Tests:  Recent Labs Lab 02/03/14 0625 02/03/14 1200 02/06/14 0500  AST 47* 41*  --   ALT 8 7  --   ALKPHOS 598* 520*  --   BILITOT 2.1* 2.0*  --   PROT 7.4 6.4  --   ALBUMIN 1.5* 1.3* 1.2*   No results found for this basename: LIPASE, AMYLASE,  in the last 168 hours  Recent Labs Lab 02/03/14 1153  AMMONIA 70*   CBC:  Recent Labs Lab 02/03/14 0625 02/04/14 0540 02/05/14 0500 02/06/14 0500  WBC 6.2 6.2 4.1 5.0  NEUTROABS  --  4.6  --   --   HGB 7.8* 7.4* 7.2* 7.1*  HCT 24.8* 23.9* 23.4* 22.9*  MCV 101.6* 99.6 99.2 99.1  PLT 185 184 168 178   Cardiac Enzymes:  Recent Labs Lab 02/03/14 0625  TROPONINI <0.30   CBG:  Recent Labs Lab 02/05/14 0838 02/05/14 1207 02/05/14 1626 02/05/14 2024 02/06/14 0818  GLUCAP 160* 192* 192* 215* 211*    Iron Studies: No results found for this basename: IRON, TIBC, TRANSFERRIN, FERRITIN,  in the last 72 hours Studies/Results: Dg Chest 2 View  02/05/2014   CLINICAL DATA:  Shortness of breath, pain in lower abdomen  EXAM: CHEST  2 VIEW  COMPARISON:  02/03/2014  FINDINGS: Mild cardiac enlargement stable. Mild vascular congestion. Consolidation in the right lower lobe similar to prior study, with stable small effusion. Infiltrate in the left upper lobe, similar to slightly more prominent. Stable left perihilar infiltrate. Stable right central line.  IMPRESSION: Right lower lobe consolidation with small effusion, stable. Left perihilar and upper lobe infiltrate slightly worse.   Electronically Signed   By: Esperanza Heir M.D.   On: 02/05/2014 17:31   Medications: Infusions:    Scheduled Medications: . abacavir  600 mg Oral Daily  . acyclovir  800 mg Oral Daily  . aspirin EC  81 mg  Oral Daily  . cloNIDine  0.3 mg Transdermal Weekly  . Darunavir Ethanolate  800 mg Oral Q breakfast  . doxazosin  4 mg Oral QHS  . furosemide  80 mg Oral BID  . heparin  5,000 Units Subcutaneous 3 times per day  . hydrALAZINE  100 mg Oral 3 times per day  . hydrocerin  1 application Topical BID  . insulin aspart  0-15 Units Subcutaneous TID WC  . insulin aspart  0-5 Units Subcutaneous QHS  . labetalol  700 mg Oral BID  . lacosamide  150 mg Oral BID  . lamiVUDine  150 mg Oral Daily  . levETIRAcetam  1,500 mg Oral BID  . metroNIDAZOLE  500 mg Oral 3 times per day  . pantoprazole  40 mg Oral Daily  . piperacillin-tazobactam (ZOSYN)  IV  3.375 g Intravenous 3 times per day  . ritonavir  100 mg Oral Q breakfast  . sodium bicarbonate  650 mg Oral BID  . sodium chloride  3 mL Intravenous Q12H  . vancomycin  1,250 mg Intravenous Q24H    have reviewed scheduled and prn medications.  Physical Exam: General: alert Heart: RRR Lungs: mostly clear Abdomen: soft, non tender Extremities: pitting edema    02/06/2014,11:06 AM  LOS: 3 days

## 2014-02-06 NOTE — Progress Notes (Signed)
INFECTIOUS DISEASE PROGRESS NOTE  ID: Michelle [name] is a 53 y.o. female with  Principal Problem:   HCAP (healthcare-associated pneumonia) Active Problems:   Unspecified essential hypertension   HIV disease   CKD (chronic kidney disease) stage 4, GFR 15-29 ml/min   DM2 (diabetes mellitus, type 2)   Anemia of chronic disease   Unspecified protein-calorie malnutrition   Pruritus   Intermittent confusion   Acute diastolic congestive heart failure   Metabolic acidosis, increased anion gap   Elevated alkaline phosphatase level   Ascites   Dyspnea   C. difficile diarrhea   Uncontrolled hypertension  Subjective: 1 BM today.   Abtx:  Anti-infectives   Start     Dose/Rate Route Frequency Ordered Stop   02/04/14 1000  azithromycin (ZITHROMAX) tablet 500 mg  Status:  Discontinued     500 mg Oral Daily 02/03/14 1053 02/03/14 1312   02/04/14 1000  lamiVUDine (EPIVIR) 10 MG/ML solution 150 mg  Status:  Discontinued     150 mg Oral Daily 02/04/14 0728 02/04/14 0813   02/04/14 1000  lamiVUDine (EPIVIR) tablet 150 mg     150 mg Oral Daily 02/04/14 0813     02/04/14 0600  vancomycin (VANCOCIN) IVPB 750 mg/150 ml premix  Status:  Discontinued     750 mg 150 mL/hr over 60 Minutes Intravenous Every 24 hours 02/03/14 0657 02/03/14 0858   02/04/14 0600  vancomycin (VANCOCIN) 1,250 mg in sodium chloride 0.9 % 250 mL IVPB     1,250 mg 166.7 mL/hr over 90 Minutes Intravenous Every 24 hours 02/03/14 0858     02/03/14 1130  metroNIDAZOLE (FLAGYL) tablet 500 mg     500 mg Oral 3 times per day 02/03/14 1124     02/03/14 1000  lamiVUDine (EPIVIR) 10 MG/ML solution 100 mg  Status:  Discontinued     100 mg Oral Daily 02/03/14 0440 02/04/14 0728   02/03/14 1000  abacavir (ZIAGEN) tablet 600 mg     600 mg Oral Daily 02/03/14 0440     02/03/14 1000  acyclovir (ZOVIRAX) tablet 800 mg     800 mg Oral Daily 02/03/14 0440     02/03/14 1000  azithromycin (ZITHROMAX) tablet 500 mg  Status:  Discontinued     500 mg Oral Daily 02/03/14 0632 02/03/14 1046   02/03/14 0800  Darunavir Ethanolate (PREZISTA) tablet 800 mg     800 mg Oral Daily with breakfast 02/03/14 0440     02/03/14 0800  ritonavir (NORVIR) capsule 100 mg     100 mg Oral Daily with breakfast 02/03/14 0440     02/03/14 0800  vancomycin (VANCOCIN) 1,500 mg in sodium chloride 0.9 % 500 mL IVPB     1,500 mg 250 mL/hr over 120 Minutes Intravenous  Once 02/03/14 0657 02/03/14 1517   02/03/14 0730  piperacillin-tazobactam (ZOSYN) IVPB 3.375 g     3.375 g 12.5 mL/hr over 240 Minutes Intravenous 3 times per day 02/03/14 0657        Medications:  Scheduled: . abacavir  600 mg Oral Daily  . acyclovir  800 mg Oral Daily  . aspirin EC  81 mg Oral Daily  . cloNIDine  0.3 mg Transdermal Weekly  . [START ON 02/07/2014] darbepoetin (ARANESP) injection - NON-DIALYSIS  100 mcg Subcutaneous Q Fri-1800  . Darunavir Ethanolate  800 mg Oral Q breakfast  . doxazosin  4 mg Oral QHS  . furosemide  80 mg Oral BID  . heparin  5,000 Units  Subcutaneous 3 times per day  . hydrALAZINE  100 mg Oral 3 times per day  . hydrocerin  1 application Topical BID  . insulin aspart  0-15 Units Subcutaneous TID WC  . insulin aspart  0-5 Units Subcutaneous QHS  . labetalol  700 mg Oral BID  . lacosamide  150 mg Oral BID  . lamiVUDine  150 mg Oral Daily  . levETIRAcetam  1,500 mg Oral BID  . metroNIDAZOLE  500 mg Oral 3 times per day  . pantoprazole  40 mg Oral Daily  . piperacillin-tazobactam (ZOSYN)  IV  3.375 g Intravenous 3 times per day  . ritonavir  100 mg Oral Q breakfast  . sodium bicarbonate  650 mg Oral BID  . sodium chloride  3 mL Intravenous Q12H  . vancomycin  1,250 mg Intravenous Q24H    Objective: Vital signs in last 24 hours: Temp:  [98.4 F (36.9 C)-98.7 F (37.1 C)] 98.4 F (36.9 C) (09/10 1332) Pulse Rate:  [74-91] 74 (09/10 1332) Resp:  [18-20] 18 (09/10 1332) BP: (143-182)/(68-83) 166/79 mmHg (09/10 1332) SpO2:  [99 %-100 %] 100 %  (09/10 1332) Weight:  [94.257 kg (207 lb 12.8 oz)-95 kg (209 lb 7 oz)] 95 kg (209 lb 7 oz) (09/10 0415)   General appearance: alert, cooperative and no distress Resp: clear to auscultation bilaterally Cardio: regular rate and rhythm GI: normal findings: bowel sounds normal and soft, non-tender Extremities: edema anasarca  Lab Results  Recent Labs  02/05/14 0500 02/06/14 0500  WBC 4.1 5.0  HGB 7.2* 7.1*  HCT 23.4* 22.9*  NA 142 142  K 4.0 3.8  CL 112 113*  CO2 16* 15*  BUN 22 23  CREATININE 2.19* 2.32*   Liver Panel  Recent Labs  02/06/14 0500  ALBUMIN 1.2*   Sedimentation Rate No results found for this basename: ESRSEDRATE,  in the last 72 hours C-Reactive Protein No results found for this basename: CRP,  in the last 72 hours  Microbiology: Recent Results (from the past 240 hour(s))  MRSA PCR SCREENING     Status: None   Collection Time    02/03/14  3:24 AM      Result Value Ref Range Status   MRSA by PCR NEGATIVE  NEGATIVE Final   Comment:            The GeneXpert MRSA Assay (FDA     approved for NASAL specimens     only), is one component of a     comprehensive MRSA colonization     surveillance program. It is not     intended to diagnose MRSA     infection nor to guide or     monitor treatment for     MRSA infections.  CULTURE, BLOOD (ROUTINE X 2)     Status: None   Collection Time    02/03/14  6:25 AM      Result Value Ref Range Status   Specimen Description BLOOD LEFT HAND   Final   Special Requests BOTTLES DRAWN AEROBIC ONLY 3CC   Final   Culture  Setup Time     Final   Value: 02/03/2014 14:16     Performed at [facility]   Culture     Final   Value:        BLOOD CULTURE RECEIVED NO GROWTH TO DATE CULTURE WILL BE HELD FOR 5 DAYS BEFORE ISSUING A FINAL NEGATIVE REPORT     Performed at [facility]  Report Status PENDING   Incomplete  CULTURE, BLOOD (ROUTINE X 2)     Status: None   Collection Time    02/03/14  6:35 AM       Result Value Ref Range Status   Specimen Description BLOOD LEFT WRIST   Final   Special Requests     Final   Value: BOTTLES DRAWN AEROBIC AND ANAEROBIC BLUE 10CC RED 5CC   Culture  Setup Time     Final   Value: 02/03/2014 14:16     Performed at [facility]   Culture     Final   Value:        BLOOD CULTURE RECEIVED NO GROWTH TO DATE CULTURE WILL BE HELD FOR 5 DAYS BEFORE ISSUING A FINAL NEGATIVE REPORT     Performed at [facility]   Report Status PENDING   Incomplete  CLOSTRIDIUM DIFFICILE BY PCR     Status: Abnormal   Collection Time    02/03/14  6:55 AM      Result Value Ref Range Status   C difficile by pcr POSITIVE (*) NEGATIVE Final   Comment: CRITICAL RESULT CALLED TO, READ BACK BY AND VERIFIED WITH:     DECENA RN 11:00 02/03/14 ([email or web address])    Studies/Results: Dg Chest 2 View  02/05/2014   CLINICAL DATA:  Shortness of breath, pain in lower abdomen  EXAM: CHEST  2 VIEW  COMPARISON:  02/03/2014  FINDINGS: Mild cardiac enlargement stable. Mild vascular congestion. Consolidation in the right lower lobe similar to prior study, with stable small effusion. Infiltrate in the left upper lobe, similar to slightly more prominent. Stable left perihilar infiltrate. Stable right central line.  IMPRESSION: Right lower lobe consolidation with small effusion, stable. Left perihilar and upper lobe infiltrate slightly worse.   Electronically Signed   By: Esperanza Heir M.D.   On: 02/05/2014 17:31     Assessment/Plan: Pneumonia  HIV+  ESRD  HTN C diff+  DM2  Hepatitis C  Severe Protein Calorie Malnutrition  Total days of antibiotics 4 (vanco/zosyn/flagyl)  Would- Would continue flagyl for 14 days Continue vanco/ceftaz for 8 days, if she is d/c before 8th day, can get ceftaz and vanco at HD.           Johny Sax Infectious Diseases (pager) [phone] [email or web address] 02/06/2014, 5:41 PM  LOS: 3 days

## 2014-02-06 NOTE — Progress Notes (Signed)
Subjective: Repeat CXR showed RLL consolidation with small effusion otherwise stable but worsening left perihilar and upper lob infiltrate.   At bedside, she was complaining of having something in her throat that would be relieved with gagging and vomiting. She notes no subjective fevers and feels her breathing is better. She has not had any BMs this AM.   Objective: Vital signs in last 24 hours: Filed Vitals:   02/06/14 0415 02/06/14 0624 02/06/14 0938 02/06/14 1332  BP: 143/68 165/73 148/72 166/79  Pulse: 86  84 74  Temp: 98.4 F (36.9 C)   98.4 F (36.9 C)  TempSrc: Oral   Oral  Resp: 20   18  Height:      Weight: 209 lb 7 oz (95 kg)     SpO2: 100%   100%   Weight change: 1 lb 12.2 oz (0.8 kg)  Intake/Output Summary (Last 24 hours) at 02/06/14 1534 Last data filed at 02/06/14 1343  Gross per 24 hour  Intake    900 ml  Output   1051 ml  Net   -151 ml   General: sitting upright HEENT: PERRL, EOMI, scleral icterus  Cardiac: RRR, no rubs, murmurs or gallops  Chest: right IJ tunneled PICC line in right upper chest, clean dry and intact without tenderness or erythema  Pulm: clear to auscultation bilaterally, breathing comfortably on room air  Abd: soft, mildly TTP diffusely, BS present  Ext: warm and well perfused, pitting edema present at thighs bilaterally (R > L)  Neuro: responds to questions appropriately; moving all extremities freely though requires help to sit up   Lab Results: Basic Metabolic Panel:  Recent Labs Lab 02/05/14 0500 02/06/14 0500  NA 142 142  K 4.0 3.8  CL 112 113*  CO2 16* 15*  GLUCOSE 147* 177*  BUN 22 23  CREATININE 2.19* 2.32*  CALCIUM 7.1* 7.2*  PHOS  --  4.1   Liver Function Tests:  Recent Labs Lab 02/03/14 0625 02/03/14 1200 02/06/14 0500  AST 47* 41*  --   ALT 8 7  --   ALKPHOS 598* 520*  --   BILITOT 2.1* 2.0*  --   PROT 7.4 6.4  --   ALBUMIN 1.5* 1.3* 1.2*    Recent Labs Lab 02/03/14 1153  AMMONIA 70*    CBC:  Recent Labs Lab 02/04/14 0540 02/05/14 0500 02/06/14 0500  WBC 6.2 4.1 5.0  NEUTROABS 4.6  --   --   HGB 7.4* 7.2* 7.1*  HCT 23.9* 23.4* 22.9*  MCV 99.6 99.2 99.1  PLT 184 168 178   Cardiac Enzymes:  Recent Labs Lab 02/03/14 0625  TROPONINI <0.30   CBG:  Recent Labs Lab 02/05/14 0838 02/05/14 1207 02/05/14 1626 02/05/14 2024 02/06/14 0818 02/06/14 1139  GLUCAP 160* 192* 192* 215* 211* 244*   Studies/Results: Dg Chest 2 View  02/05/2014   CLINICAL DATA:  Shortness of breath, pain in lower abdomen  EXAM: CHEST  2 VIEW  COMPARISON:  02/03/2014  FINDINGS: Mild cardiac enlargement stable. Mild vascular congestion. Consolidation in the right lower lobe similar to prior study, with stable small effusion. Infiltrate in the left upper lobe, similar to slightly more prominent. Stable left perihilar infiltrate. Stable right central line.  IMPRESSION: Right lower lobe consolidation with small effusion, stable. Left perihilar and upper lobe infiltrate slightly worse.   Electronically Signed   By: Esperanza Heir M.D.   On: 02/05/2014 17:31   Medications: I have reviewed the patient's current medications.  Scheduled Meds: . abacavir  600 mg Oral Daily  . acyclovir  800 mg Oral Daily  . aspirin EC  81 mg Oral Daily  . cloNIDine  0.3 mg Transdermal Weekly  . [START ON 02/07/2014] darbepoetin (ARANESP) injection - NON-DIALYSIS  100 mcg Subcutaneous Q Fri-1800  . Darunavir Ethanolate  800 mg Oral Q breakfast  . doxazosin  4 mg Oral QHS  . furosemide  80 mg Oral BID  . heparin  5,000 Units Subcutaneous 3 times per day  . hydrALAZINE  100 mg Oral 3 times per day  . hydrocerin  1 application Topical BID  . insulin aspart  0-15 Units Subcutaneous TID WC  . insulin aspart  0-5 Units Subcutaneous QHS  . labetalol  700 mg Oral BID  . lacosamide  150 mg Oral BID  . lamiVUDine  150 mg Oral Daily  . levETIRAcetam  1,500 mg Oral BID  . metroNIDAZOLE  500 mg Oral 3 times per day  .  pantoprazole  40 mg Oral Daily  . piperacillin-tazobactam (ZOSYN)  IV  3.375 g Intravenous 3 times per day  . ritonavir  100 mg Oral Q breakfast  . sodium bicarbonate  650 mg Oral BID  . sodium chloride  3 mL Intravenous Q12H  . vancomycin  1,250 mg Intravenous Q24H   Continuous Infusions:  PRN Meds:.acetaminophen, cloNIDine, hydrOXYzine, sodium chloride Assessment/Plan:  Ms. Kirouac is a 53 year old family with multiple medical co-morbidities who was hospitalized for dyspnea found to have C. Diff infection.  Dyspnea: Likely HCAP, less likely acute-on-chronic diastolic dysfunction given her risk factors. CXR shows worsening infiltrate though appears better clinically.  -Blood cultures negative x 3 days -Continue monitoring I&Os, daily weights -Legionella pending -Abx Day 4: continue vancomycin & Zosyn  C Diff infection: PCR positive on 9/7. Diarrhea improved today.  -Abx Day 3/14: Flagyl PO 500mg  TID  Hypertension: BP stable 140s-170s/70s-80s with one reading at 184/97. Nephrology recommends labetalol and doxazosin if other meds need to be tapered. 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 Ammonia level 70 on 02/03/2914. US abdomen with no significant ascites.  -Continue hydroxyzine 10mg  Q6hr prn itching  -Hold home lactulose while increased BM 2/2 C.diff infection  Chest pain: Cardiac workup was unremarkable, and pain has since resolved.  -Continue to monitor.  Non anion gap metabolic acidosis: Chronic, likely due to chronic kidney disease. Anion gap 14, stable from yesterday. Bicarb 15, stable from yesterday as well. -Continue to monitor   Anemia of chronic disease: Hemoglobin 7.1 today, down  from 8.1 on admission. Baseline 7-8.  -Resumed Aranesp per nephrology recs -Continue to monitor   Type 2 diabetes mellitus: Last A1c was 7.5, 3 months ago.  -Continue sliding scale insulin, moderate.   CKD Stage IV: Creatinine 2.3, up from 2.19 yesterday though at prior baseline.  -Continue home sodium bicarbonate 650 mg twice a day   HIV (CD4: 240, VL <20 In August 2015): Stable.  -Continue home lamivudine, ritonavir, darunavir, and abacavir  -Continue acyclovir for prophylaxis  -HIV VL pending  Seizure disorder: Stable.  - Continue home Keppra and lacosamide.   #FEN:  -Diet: Renal/Carb Modified with 1200-mL fluid restriction  #DVT prophylaxis: heparin 5000 units TID  #  CODE STATUS: FULL CODE  Dispo: Disposition is deferred at this time, awaiting improvement of current medical problems.  Anticipated discharge in approximately 1-2 day(s).   The patient does have a current PCP Christen Bame, MD) and does need an [facility] hospital follow-up appointment after discharge.  The patient does have transportation limitations that hinder transportation to clinic appointments.  .Services Needed at time of discharge: Y = Yes, Blank = No PT: SNF  OT:   RN:   Equipment:   Other:     LOS: 3 days   Heywood Iles, MD 02/06/2014, 3:34 PM

## 2014-02-06 NOTE — Progress Notes (Signed)
Internal Medicine Attending  Date: 02/06/2014  Patient name: Michelle [name] Medical record number: [id number] Date of birth: [birth year] Age: 53 y.o. Gender: female  I saw and evaluated the patient, and discussed her care with resident on A.M rounds.  I reviewed the resident's note by Dr. Allena Katz and I agree with the resident's findings and plans as documented in his note.

## 2014-02-06 NOTE — Evaluation (Signed)
Physical Therapy Evaluation Patient Details Name: Michelle [name] MRN: [id number] DOB: 09-03-1960 Today's Date: 02/06/2014   History of Present Illness  Patient is a 53 y/o female admitted from SNF with chest pain and SOB. Recently admitted 8/24-8/28 for HCAP. PMH of diastolic congestive heart failure, end-stage liver disease, HIV disease, stage IV chronic kidney disease, type 2 diabetes, anemia chronic disease, and seizure disorder. In ER, BNP 7540. C.diff (+).     Clinical Impression  Patient presents with functional limitations due to deficits listed in PT problem list (see below). Pt self limiting during PT evaluation requiring constant cues to stay on task. Easily distracted. Agreeable to transfer to [facility] and get into chair but declined any further ambulation. Pt dyspneic with minimal activity/exercise requiring frequent rest breaks. "You are making me tired, stop talking to me!." Pt would benefit from return to ST SNF to improve transfers, gait, balance and endurance so pt can maximize independence, ease burden of care and improve quality of life.     Follow Up Recommendations SNF;Supervision/Assistance - 24 hour    Equipment Recommendations  None recommended by PT    Recommendations for Other Services       Precautions / Restrictions Precautions Precautions: Fall Precaution Comments: Enteric precautions. Restrictions Weight Bearing Restrictions: No      Mobility  Bed Mobility Overal bed mobility: Needs Assistance Bed Mobility: Supine to Sit     Supine to sit: Mod assist;HOB elevated     General bed mobility comments: Max cues required to perform transfer- step by step. Pt ordering therapist around, "put both my legs together, hurry move them over to the side." Mod A to mobilize BLEs to EOB. Increased time.  Transfers Overall transfer level: Needs assistance Equipment used: None;Rolling walker (2 wheeled) Transfers: Stand Pivot Transfers Sit to Stand: Min guard Stand  pivot transfers: Min guard       General transfer comment: SPT bed<-> BSC with increased time, pt talking herself through each step. VC for positioning of BLEs and for hand placement upon standing. Min guard for safety.   Ambulation/Gait Ambulation/Gait assistance: Min guard Ambulation Distance (Feet): 6 Feet Assistive device: None Gait Pattern/deviations: Trunk flexed;Wide base of support Gait velocity: Decreased   General Gait Details: Ambulated a few feet to the chair but refused to use RW for support. Reaching out to grab onto bed rail and arm rest of chair for support despite cues for safety and to use RW. Min guard for safety. Pt declined any further ambulation.  Stairs            Wheelchair Mobility    Modified Rankin (Stroke Patients Only)       Balance Overall balance assessment: Needs assistance   Sitting balance-Leahy Scale: Fair Sitting balance - Comments: Able to sit EOB without support, however required assist with repositioning and scooting. Pt self limiting.   Standing balance support: During functional activity Standing balance-Leahy Scale: Fair Standing balance comment: Requires UE support during transfers due to balance deficits. Total A for pericare/hygiene with min A for balance.                              Pertinent Vitals/Pain Pain Assessment: No/denies pain    Home Living Family/patient expects to be discharged to:: Skilled nursing facility                      Prior Function Level of Independence: Needs  assistance         Comments: Pt reports working with PT at [facility] and ambulating with RW.      Hand Dominance        Extremity/Trunk Assessment   Upper Extremity Assessment: Overall WFL for tasks assessed           Lower Extremity Assessment: Generalized weakness      Cervical / Trunk Assessment: Normal  Communication   Communication: No difficulties  Cognition Arousal/Alertness:  Awake/alert Behavior During Therapy: WFL for tasks assessed/performed Overall Cognitive Status: Impaired/Different from baseline       Memory: Decreased short-term memory (" i have been having problems with my memory lately.") Following Commands: Follows one step commands with increased time Safety/Judgement: Decreased awareness of safety;Decreased awareness of deficits   Problem Solving: Slow processing;Requires verbal cues;Requires tactile cues General Comments: Pt requires constant repetition for questions and commands. Poor safety awareness. Refuses to use RW, "get that thing away from me,"    General Comments General comments (skin integrity, edema, etc.): Edema present BLEs, R>L. Tightness and edema present LUE.    Exercises        Assessment/Plan    PT Assessment Patient needs continued PT services  PT Diagnosis Difficulty walking;Generalized weakness   PT Problem List Decreased strength;Decreased cognition;Decreased activity tolerance;Decreased knowledge of use of DME;Decreased safety awareness;Decreased balance;Decreased mobility;Decreased skin integrity;Decreased knowledge of precautions  PT Treatment Interventions DME instruction;Balance training;Gait training;Cognitive remediation;Patient/family education;Functional mobility training;Therapeutic activities;Therapeutic exercise   PT Goals (Current goals can be found in the Care Plan section) Acute Rehab PT Goals Patient Stated Goal: none stated. PT Goal Formulation: With patient Time For Goal Achievement: 02/20/14 Potential to Achieve Goals: Good    Frequency Min 2X/week   Barriers to discharge        Co-evaluation               End of Session Equipment Utilized During Treatment: Gait belt Activity Tolerance: Patient limited by fatigue;Other (comment) (Pt self limiting.) Patient left: in chair;with call bell/phone within reach Nurse Communication: Mobility status         Time: 7106-2694 PT Time  Calculation (min): 27 min   Charges:   PT Evaluation $Initial PT Evaluation Tier I: 1 Procedure PT Treatments $Therapeutic Activity: 8-22 mins   PT G CodesAlvie Heidelberg A 02/06/2014, 2:38 PM Alvie Heidelberg, PT, DPT [phone]

## 2014-02-07 DIAGNOSIS — I10 Essential (primary) hypertension: Secondary | ICD-10-CM

## 2014-02-07 LAB — LEGIONELLA ANTIGEN, URINE: Legionella Antigen, Urine: NEGATIVE

## 2014-02-07 LAB — CBC
HCT: 22.2 % — ABNORMAL LOW (ref 36.0–46.0)
Hemoglobin: 7 g/dL — ABNORMAL LOW (ref 12.0–15.0)
MCH: 32.3 pg (ref 26.0–34.0)
MCHC: 31.5 g/dL (ref 30.0–36.0)
MCV: 102.3 fL — ABNORMAL HIGH (ref 78.0–100.0)
PLATELETS: 153 10*3/uL (ref 150–400)
RBC: 2.17 MIL/uL — AB (ref 3.87–5.11)
RDW: 19.2 % — ABNORMAL HIGH (ref 11.5–15.5)
WBC: 3.9 10*3/uL — AB (ref 4.0–10.5)

## 2014-02-07 LAB — GLUCOSE, CAPILLARY
GLUCOSE-CAPILLARY: 147 mg/dL — AB (ref 70–99)
GLUCOSE-CAPILLARY: 211 mg/dL — AB (ref 70–99)
Glucose-Capillary: 253 mg/dL — ABNORMAL HIGH (ref 70–99)

## 2014-02-07 LAB — BASIC METABOLIC PANEL
Anion gap: 15 (ref 5–15)
BUN: 23 mg/dL (ref 6–23)
CO2: 15 mEq/L — ABNORMAL LOW (ref 19–32)
Calcium: 7 mg/dL — ABNORMAL LOW (ref 8.4–10.5)
Chloride: 113 mEq/L — ABNORMAL HIGH (ref 96–112)
Creatinine, Ser: 2.48 mg/dL — ABNORMAL HIGH (ref 0.50–1.10)
GFR calc non Af Amer: 21 mL/min — ABNORMAL LOW (ref >90)
GFR, EST AFRICAN AMERICAN: 24 mL/min — AB (ref >90)
Glucose, Bld: 151 mg/dL — ABNORMAL HIGH (ref 70–99)
POTASSIUM: 3.6 meq/L — AB (ref 3.7–5.3)
SODIUM: 143 meq/L (ref 137–147)

## 2014-02-07 LAB — VANCOMYCIN, TROUGH: Vancomycin Tr: 37.3 ug/mL (ref 10.0–20.0)

## 2014-02-07 MED ORDER — LABETALOL HCL 200 MG PO TABS: 800.0000 mg | ORAL_TABLET | Freq: Two times a day (BID) | ORAL | Status: AC

## 2014-02-07 MED ORDER — HYDROCERIN EX CREA: 1.0000 "application " | TOPICAL_CREAM | Freq: Two times a day (BID) | CUTANEOUS | Status: AC

## 2014-02-07 MED ORDER — DOXAZOSIN MESYLATE 4 MG PO TABS: 4.0000 mg | ORAL_TABLET | Freq: Every day | ORAL | Status: AC

## 2014-02-07 MED ORDER — HEPARIN SOD (PORK) LOCK FLUSH 10 UNIT/ML IV SOLN: 10.0000 [IU] | INTRAVENOUS | Status: DC | PRN

## 2014-02-07 MED ORDER — CLONIDINE HCL 0.2 MG PO TABS: 0.2000 mg | ORAL_TABLET | Freq: Four times a day (QID) | ORAL | Status: DC | PRN

## 2014-02-07 MED ORDER — VANCOMYCIN HCL 10 G IV SOLR: INTRAVENOUS | Status: DC

## 2014-02-07 MED ORDER — LACTULOSE 10 GM/15ML PO SOLN: 20.0000 g | Freq: Three times a day (TID) | ORAL | Status: AC

## 2014-02-07 MED ORDER — LABETALOL HCL 100 MG PO TABS
100.0000 mg | ORAL_TABLET | Freq: Once | ORAL | Status: DC
  Filled 2014-02-07: qty 1

## 2014-02-07 MED ORDER — LABETALOL HCL 300 MG PO TABS
800.0000 mg | ORAL_TABLET | Freq: Two times a day (BID) | ORAL | Status: DC
  Filled 2014-02-07: qty 1

## 2014-02-07 MED ORDER — VANCOMYCIN HCL 10 G IV SOLR: 1250.0000 mg | INTRAVENOUS | Status: DC

## 2014-02-07 MED ORDER — LAMIVUDINE 10 MG/ML PO SOLN: 150.0000 mg | Freq: Every day | ORAL | Status: AC

## 2014-02-07 MED ORDER — LABETALOL HCL 100 MG PO TABS: 700.0000 mg | ORAL_TABLET | Freq: Two times a day (BID) | ORAL | Status: DC

## 2014-02-07 MED ORDER — DEXTROSE 5 % IV SOLN: 2.0000 g | Freq: Two times a day (BID) | INTRAVENOUS | Status: DC

## 2014-02-07 MED ORDER — CEFTAZIDIME 2 G IJ SOLR
2.0000 g | Freq: Two times a day (BID) | INTRAMUSCULAR | Status: DC
  Administered 2014-02-07: 2 g via INTRAVENOUS
  Filled 2014-02-07 (×3): qty 2

## 2014-02-07 MED ORDER — DEXTROSE 5 % IV SOLN: 2.0000 g | INTRAVENOUS | Status: AC

## 2014-02-07 MED ORDER — FUROSEMIDE 80 MG PO TABS: 80.0000 mg | ORAL_TABLET | Freq: Two times a day (BID) | ORAL | Status: AC

## 2014-02-07 MED ORDER — HEPARIN SOD (PORK) LOCK FLUSH 100 UNIT/ML IV SOLN
250.0000 [IU] | INTRAVENOUS | Status: AC | PRN
  Administered 2014-02-07: 16:00:00

## 2014-02-07 MED ORDER — DEXTROSE 5 % IV SOLN: 2.0000 g | INTRAVENOUS | Status: DC

## 2014-02-07 MED ORDER — METRONIDAZOLE 500 MG PO TABS: 500.0000 mg | ORAL_TABLET | Freq: Three times a day (TID) | ORAL | Status: AC

## 2014-02-07 MED ORDER — ASPIRIN 81 MG PO TBEC: 81.0000 mg | DELAYED_RELEASE_TABLET | Freq: Every day | ORAL | Status: AC

## 2014-02-07 MED ORDER — DIPHENHYDRAMINE HCL 25 MG PO CAPS
25.0000 mg | ORAL_CAPSULE | Freq: Once | ORAL | Status: AC | PRN
  Administered 2014-02-07: 25 mg via ORAL
  Filled 2014-02-07: qty 1

## 2014-02-07 NOTE — Clinical Social Work Note (Signed)
Per MD patient ready to DC back to [facility]. RN, patient/family Jerl Mina), and facility notified of patient's DC. RN given number for report. DC packet on patient's chart. Ambulance transport will be requested for patient by RN when patient is ready for DC (number provided [phone]). CSW signing off at this time.   Roddie Mc MSW, [place], [place], [phone]

## 2014-02-07 NOTE — Progress Notes (Signed)
PT DC WITH RIGHT IJ CAPPED BY THE IV TEAM

## 2014-02-07 NOTE — Care Management Note (Signed)
    Page 1 of 1   02/07/2014     3:32:22 PM CARE MANAGEMENT NOTE 02/07/2014  Patient:  Michelle [name], Michelle [name]   Account Number:  [id number]  Date Initiated:  02/03/2014  Documentation initiated by:  Junius Creamer  Subjective/Objective Assessment:   adm w pneumonia, dypsnea     Action/Plan:   lives alone,   Anticipated DC Date:  02/07/2014   Anticipated DC Plan:  SKILLED NURSING FACILITY  In-house referral  Clinical Social Worker      DC Planning Services  CM consult      Choice offered to / List presented to:             Status of service:  Completed, signed off Medicare Important Message given?  NO (If response is "NO", the following Medicare IM given date fields will be blank) Date Medicare IM given:   Medicare IM given by:   Date Additional Medicare IM given:   Additional Medicare IM given by:    Discharge Disposition:  SKILLED NURSING FACILITY  Per UR Regulation:  Reviewed for med. necessity/level of care/duration of stay  If discussed at Long Length of Stay Meetings, dates discussed:    Comments:

## 2014-02-07 NOTE — Progress Notes (Addendum)
Subjective: No acute events overnight. Michelle [name] reports her breathing is better. She has had decreased bowel movements that are more formed.  Objective: Vital signs in last 24 hours: Filed Vitals:   02/06/14 1332 02/06/14 2125 02/07/14 0158 02/07/14 0517  BP: 166/79 166/73 154/63 170/78  Pulse: 74 84 88 85  Temp: 98.4 F (36.9 C) 98 F (36.7 C) 98.9 F (37.2 C) 98.5 F (36.9 C)  TempSrc: Oral Oral Oral Oral  Resp: Height:      Weight:    209 lb 14.1 oz (95.2 kg)  SpO2: 100% 98% 96% 94%    Weight change: 10.6 oz (0.3 kg)  Intake/Output Summary (Last 24 hours) at 02/07/14 1204 Last data filed at 02/07/14 0521  Gross per 24 hour  Intake   1080 ml  Output   1550 ml  Net   -470 ml   General: lying in bed HEENT: PERRL, EOMI, scleral icterus  Cardiac: RRR, no rubs, murmurs or gallops Chest: right IJ tunneled PICC line in right upper chest, clean dry and intact without tenderness or erythema  Pulm: right lung base rales, clear to auscultation otherwise, breathing comfortably on RA Abd: soft, mildly TTP diffusely, BS present Ext: warm and well perfused, trace bilateral edema  Neuro: Awake and alert, oriented X3, cranial nerves II-XII grossly intact, oriented to self, time, place  Lab Results: Basic Metabolic Panel:  Recent Labs Lab 02/06/14 0500 02/07/14 0500  NA 142 143  K 3.8 3.6*  CL 113* 113*  CO2 15* 15*  GLUCOSE 177* 151*  BUN 23 23  CREATININE 2.32* 2.48*  CALCIUM 7.2* 7.0*  PHOS 4.1  --    Liver Function Tests:  Recent Labs Lab 02/03/14 0625 02/03/14 1200 02/06/14 0500  AST 47* 41*  --   ALT 8 7  --   ALKPHOS 598* 520*  --   BILITOT 2.1* 2.0*  --   PROT 7.4 6.4  --   ALBUMIN 1.5* 1.3* 1.2*   CBC: OSH WBC 8.4  Hemoglobin 8.0  Hematocrit 24.9  MCV 102  Platelets 194  Recent Labs Lab 02/04/14 0540  02/06/14 0500 02/07/14 0500  WBC 6.2  < > 5.0 3.9*  NEUTROABS 4.6  --   --   --   HGB 7.4*  < > 7.1* 7.0*  HCT 23.9*  < >  22.9* 22.2*  MCV 99.6  < > 99.1 102.3*  PLT 184  < > 178 153  < > = values in this interval not displayed. Cardiac Enzymes:  Recent Labs Lab 02/03/14 0625  TROPONINI <0.30   BNP: OSH: ProBNP 7540 (highest prior 1277 on 10/04/2013)  CBG:  Recent Labs Lab 02/05/14 2024 02/06/14 0818 02/06/14 1139 02/06/14 1717 02/06/14 2104 02/07/14 0759  GLUCAP 215* 211* 244* 193* 176* 147*   Coagulation: OSH: INR 1  Urinalysis: OSH: Few bacteria  WBC 5 to 10  Misc. Labs: LDH 02/03/2014 400  Micro Results: Recent Results (from the past 240 hour(s))  MRSA PCR SCREENING     Status: None   Collection Time    02/03/14  3:24 AM      Result Value Ref Range Status   MRSA by PCR NEGATIVE  NEGATIVE Final   Comment:            The GeneXpert MRSA Assay (FDA     approved for NASAL specimens     only), is one component of a     comprehensive MRSA  colonization     surveillance program. It is not     intended to diagnose MRSA     infection nor to guide or     monitor treatment for     MRSA infections.  CULTURE, BLOOD (ROUTINE X 2)     Status: None   Collection Time    02/03/14  6:25 AM      Result Value Ref Range Status   Specimen Description BLOOD LEFT HAND   Final   Special Requests BOTTLES DRAWN AEROBIC ONLY 3CC   Final   Culture  Setup Time     Final   Value: 02/03/2014 14:16     Performed at [facility]   Culture     Final   Value:        BLOOD CULTURE RECEIVED NO GROWTH TO DATE CULTURE WILL BE HELD FOR 5 DAYS BEFORE ISSUING A FINAL NEGATIVE REPORT     Performed at [facility]   Report Status PENDING   Incomplete  CULTURE, BLOOD (ROUTINE X 2)     Status: None   Collection Time    02/03/14  6:35 AM      Result Value Ref Range Status   Specimen Description BLOOD LEFT WRIST   Final   Special Requests     Final   Value: BOTTLES DRAWN AEROBIC AND ANAEROBIC BLUE 10CC RED 5CC   Culture  Setup Time     Final   Value: 02/03/2014 14:16     Performed at [facility]   Culture     Final   Value:        BLOOD CULTURE RECEIVED NO GROWTH TO DATE CULTURE WILL BE HELD FOR 5 DAYS BEFORE ISSUING A FINAL NEGATIVE REPORT     Performed at [facility]   Report Status PENDING   Incomplete  CLOSTRIDIUM DIFFICILE BY PCR     Status: Abnormal   Collection Time    02/03/14  6:55 AM      Result Value Ref Range Status   C difficile by pcr POSITIVE (*) NEGATIVE Final   Comment: CRITICAL RESULT CALLED TO, READ BACK BY AND VERIFIED WITH:     DECENA RN 11:00 02/03/14 ([email or web address])   Studies/Results: Dg Chest 2 View  02/05/2014   CLINICAL DATA:  Shortness of breath, pain in lower abdomen  EXAM: CHEST  2 VIEW  COMPARISON:  02/03/2014  FINDINGS: Mild cardiac enlargement stable. Mild vascular congestion. Consolidation in the right lower lobe similar to prior study, with stable small effusion. Infiltrate in the left upper lobe, similar to slightly more prominent. Stable left perihilar infiltrate. Stable right central line.  IMPRESSION: Right lower lobe consolidation with small effusion, stable. Left perihilar and upper lobe infiltrate slightly worse.   Electronically Signed   By: Esperanza Heir M.D.   On: 02/05/2014 17:31   Medications: I have reviewed the patient's current medications. Scheduled Meds: . abacavir  600 mg Oral Daily  . acyclovir  800 mg Oral Daily  . aspirin EC  81 mg Oral Daily  . cefTAZidime (FORTAZ)  IV  2 g Intravenous Q12H  . cloNIDine  0.3 mg Transdermal Weekly  . darbepoetin (ARANESP) injection - NON-DIALYSIS  100 mcg Subcutaneous Q Fri-1800  . Darunavir Ethanolate  800 mg Oral Q breakfast  . doxazosin  4 mg Oral QHS  . furosemide  80 mg Oral BID  . heparin  5,000 Units Subcutaneous 3 times per day  . hydrALAZINE  100 mg Oral 3 times per day  . hydrocerin  1 application Topical BID  . insulin aspart  0-15 Units Subcutaneous TID WC  . insulin aspart  0-5 Units Subcutaneous QHS  . labetalol  700 mg Oral BID  . lacosamide  150 mg Oral BID    . lamiVUDine  150 mg Oral Daily  . levETIRAcetam  1,500 mg Oral BID  . metroNIDAZOLE  500 mg Oral 3 times per day  . pantoprazole  40 mg Oral Daily  . ritonavir  100 mg Oral Q breakfast  . sodium bicarbonate  650 mg Oral BID  . sodium chloride  3 mL Intravenous Q12H   Continuous Infusions:  PRN Meds:.acetaminophen, cloNIDine, hydrOXYzine, sodium chloride Assessment/Plan: Principal Problem:   HCAP (healthcare-associated pneumonia) Active Problems:   Unspecified essential hypertension   HIV disease   CKD (chronic kidney disease) stage 4, GFR 15-29 ml/min   DM2 (diabetes mellitus, type 2)   Anemia of chronic disease   Unspecified protein-calorie malnutrition   Pruritus   Intermittent confusion   Acute diastolic congestive heart failure   Metabolic acidosis, increased anion gap   Elevated alkaline phosphatase level   Ascites   Dyspnea   C. difficile diarrhea   Uncontrolled hypertension  HCAP: ID consulted. Blood cultures NGTD. - continue IV vancomycin, dose held for today as trough level 37.3. Recheck random level in AM. To complete total 8 day course. - legionella antigen pending - switch zosyn to ceftaz to complete total 8 day course.  C Diff: positive on PCR, no past C diff in chart -Flagyl PO 500mg  TID to complete 14 day course  Hypertension: BP better controlled. If BP meds need to be tapered, favor labetalol and/or doxazosin per nephrology. 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 Ammonia level 70 on 02/03/2914. US abdomen with no significant ascites. - hydroxyzine 10mg  Q6hr prn itching - will restart lactulose upon discharge if continues to have  less than 3 BM per day  Chest pain: troponin negative x 2, EKG with T wave inversion in V1, unchanged from previous otherwise. Pain has since resolved. -continue to monitor  Non anion gap metabolic acidosis: Chronic, likely due to chronic kidney disease. Stable here. - Continue to monitor   Anemia of chronic disease: Hemoglobin 7.0 -Resumed Aranesp per nephrology  Type 2 diabetes mellitus: Last A1c was 7.5, 3 months ago.  - sliding scale insulin, moderate.   Chronic kidney disease stage IV: Creatinine 2.5, at prior baseline.  - continue home sodium bicarbonate 650 mg twice a day   HIV (CD4: 240, VL <20 In August 2015): CD4 stable at 240 and VL <20 02/05/2014 - continue home lamivudine, ritonavir, darunavir, and abacavir  - continue acyclovir for prophylaxis   Seizure disorder  - Continue home Keppra and lacosamide.   Dispo: likely can return to SNF today  The patient does have a current PCP Christen Bame, MD) and does need an [facility] hospital follow-up appointment after discharge.  The patient does not have transportation limitations that hinder transportation to clinic appointments.  .Services Needed at time of discharge: Y = Yes, Blank = No PT:   OT:  RN:   Equipment:   Other:     LOS: 4 days   Griffin Basil, MD 02/07/2014, 12:04 PM

## 2014-02-07 NOTE — Progress Notes (Signed)
Report called to [place] at [facility].

## 2014-02-07 NOTE — Discharge Instructions (Signed)
Pneumonia, Adult  Pneumonia is an infection of the lungs. It may be caused by a germ (virus or bacteria). Some types of pneumonia can spread easily from person to person. This can happen when you cough or sneeze.  HOME CARE   Only take medicine as told by your doctor.   Take your medicine (antibiotics) as told. Finish it even if you start to feel better.   Do not smoke.   You may use a vaporizer or humidifier in your room. This can help loosen thick spit (mucus).   Sleep so you are almost sitting up (semi-upright). This helps reduce coughing.   Rest.  A shot (vaccine) can help prevent pneumonia. Shots are often advised for:   People over 65 years old.   Patients on chemotherapy.   People with long-term (chronic) lung problems.   People with immune system problems.  GET HELP RIGHT AWAY IF:    You are getting worse.   You cannot control your cough, and you are losing sleep.   You cough up blood.   Your pain gets worse, even with medicine.   You have a fever.   Any of your problems are getting worse, not better.   You have shortness of breath or chest pain.  MAKE SURE YOU:    Understand these instructions.   Will watch your condition.   Will get help right away if you are not doing well or get worse.  Document Released: 11/02/2007 Document Revised: 08/08/2011 Document Reviewed: 08/06/2010  ExitCare Patient Information 2015 [employer]. This information is not intended to replace advice given to you by your health care provider. Make sure you discuss any questions you have with your health care provider.

## 2014-02-07 NOTE — Progress Notes (Addendum)
ANTIBIOTIC CONSULT NOTE  Pharmacy Consult for Vancomycin and Zosyn to [place] Indication: Pneumonia (Day 5 total of therapy)  Allergies  Allergen Reactions  . 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 Morphine And Related Hives, Itching and Rash  . Norvasc [Amlodipine Besylate] Hives, Itching and Rash     Recent Labs  02/05/14 0500 02/06/14 0500 02/07/14 0500  WBC 4.1 5.0 3.9*  HGB 7.2* 7.1* 7.0*  PLT 168 178 153  CREATININE 2.19* 2.32* 2.48*   Estimated Creatinine Clearance: 29.6 ml/min (by C-G formula based on Cr of 2.48).  Recent Labs  02/07/14 0530  VANCOTROUGH 37.3*     Microbiology: Recent Results (from the past 720 hour(s))  CULTURE, BLOOD (ROUTINE X 2)     Status: None   Collection Time    01/10/14 12:47 AM      Result Value Ref Range Status   Specimen Description BLOOD LEFT FOREARM   Final   Special Requests     Final   Value: BOTTLES DRAWN AEROBIC AND ANAEROBIC BLUE 10 CC RED 5 CC   Culture  Setup Time     Final   Value: 01/11/2014 13:10     Performed at [facility]   Culture     Final   Value: NO GROWTH 5 DAYS     Performed at [facility]   Report Status 01/17/2014 FINAL   Final  CULTURE, BLOOD (ROUTINE X 2)     Status: None   Collection Time    01/10/14 12:53 AM      Result Value Ref Range Status   Specimen Description BLOOD LEFT HAND   Final   Special Requests BOTTLES DRAWN AEROBIC AND ANAEROBIC 10 CC   Final   Culture  Setup Time     Final   Value: 01/11/2014 13:10     Performed at [facility]   Culture     Final   Value: NO GROWTH 5 DAYS     Performed at [facility]   Report Status 01/17/2014 FINAL   Final  MRSA PCR SCREENING     Status: None   Collection Time    01/20/14  5:28 PM      Result Value Ref Range Status   MRSA by PCR NEGATIVE  NEGATIVE Final   Comment:            The GeneXpert MRSA Assay (FDA     approved for NASAL specimens      only), is one component of a     comprehensive MRSA colonization     surveillance program. It is not     intended to diagnose MRSA     infection nor to guide or     monitor treatment for     MRSA infections.  MRSA PCR SCREENING     Status: None   Collection Time    02/03/14  3:24 AM      Result Value Ref Range Status   MRSA by PCR NEGATIVE  NEGATIVE Final   Comment:            The GeneXpert MRSA Assay (FDA     approved for NASAL specimens     only), is one component of a     comprehensive MRSA colonization     surveillance program. It is not     intended to diagnose MRSA     infection nor to guide or  monitor treatment for     MRSA infections.  CULTURE, BLOOD (ROUTINE X 2)     Status: None   Collection Time    02/03/14  6:25 AM      Result Value Ref Range Status   Specimen Description BLOOD LEFT HAND   Final   Special Requests BOTTLES DRAWN AEROBIC ONLY 3CC   Final   Culture  Setup Time     Final   Value: 02/03/2014 14:16     Performed at [facility]   Culture     Final   Value:        BLOOD CULTURE RECEIVED NO GROWTH TO DATE CULTURE WILL BE HELD FOR 5 DAYS BEFORE ISSUING A FINAL NEGATIVE REPORT     Performed at [facility]   Report Status PENDING   Incomplete  CULTURE, BLOOD (ROUTINE X 2)     Status: None   Collection Time    02/03/14  6:35 AM      Result Value Ref Range Status   Specimen Description BLOOD LEFT WRIST   Final   Special Requests     Final   Value: BOTTLES DRAWN AEROBIC AND ANAEROBIC BLUE 10CC RED 5CC   Culture  Setup Time     Final   Value: 02/03/2014 14:16     Performed at [facility]   Culture     Final   Value:        BLOOD CULTURE RECEIVED NO GROWTH TO DATE CULTURE WILL BE HELD FOR 5 DAYS BEFORE ISSUING A FINAL NEGATIVE REPORT     Performed at [facility]   Report Status PENDING   Incomplete  CLOSTRIDIUM DIFFICILE BY PCR     Status: Abnormal   Collection Time    02/03/14  6:55 AM      Result Value Ref  Range Status   C difficile by pcr POSITIVE (*) NEGATIVE Final   Comment: CRITICAL RESULT CALLED TO, READ BACK BY AND VERIFIED WITH:     DECENA RN 11:00 02/03/14 ([email or web address])      Assessment: 53 yo female with history of CHF, end stage liver disease, HIV disease, stage IV CKD, type 2 DM, AOC, seizure disorder, and Cdiff who continues on broad spectrum antibiotics for pneumonia.  Scr has trended up since admission (lasix added) Vancomycin trough level elevated at 37 Blood cultures negative Afebrile, WBC trending down  ID plan is to continue Vancomycin and Fortaz for 8 days, Po Flagyl for 14 days  Goal of Therapy:  Vancomycin trough level 15-20 mcg/ml Zosyn switched to [place] 9/11 -- appropriate dosing  Plan:  Hold Vancomycin for now -- recheck random level in AM Fortaz 2 grams iv Q 12 hours Follow up Scr, cultures, and fever trend  Thank you. Okey Regal, PharmD [phone]  02/07/2014,9:53 AM   Addendum:  Based on patient's renal function (CrCl <30) and worsening, would recommend adjusting Ceftaz dose to 2gm IV q24h.  Aware of potential discharge back to rehab facility today.  I have paged Dr. Isabella Bowens with this information to edit the D/C meds accordingly.  [employer], Pharm.D., BCPS Clinical Pharmacist Pager [phone] 02/07/2014 4:46 PM

## 2014-02-07 NOTE — Clinical Social Work Psychosocial (Signed)
Clinical Social Work Department BRIEF PSYCHOSOCIAL ASSESSMENT 02/07/2014  Patient:  Michelle [name], Michelle [name]     Account Number:  [id number]     Admit date:  02/03/2014  Clinical Social Worker:  Lovey Newcomer  Date/Time:  02/07/2014 12:27 PM  Referred by:  Physician  Date Referred:  02/07/2014 Referred for  SNF Placement   Other Referral:   Interview type:  Patient Other interview type:   Patient alert and oriented at time of assessment.    PSYCHOSOCIAL DATA Living Status:  FACILITY Admitted from facility:  [place] Level of care:  [place] Primary support name:  Arletha Primary support relationship to patient:  SIBLING Degree of support available:   Support is good.    CURRENT CONCERNS Current Concerns  Post-Acute Placement   Other Concerns:   NA    SOCIAL WORK ASSESSMENT / PLAN CSW met with patient at bedside to complete assessment. CSW recognizes patient from previous admissions. Patient appears to be much more alert and engaged that during previous admissions. CSW inquired about patient's experience at the facility. Patient reports that she does not like the facility and does have several complaints, but she does state that she feels like her health has improved since she has been in a facility verses home. Patient reports that she would be interested in looking into other facilities that will accept her Medicaid. CSW explained SNF search/placement process and answered questions. CSW explained that if patient is not offered another facility, patient will need to DC back to [facility]. CSW will assist with DC. Patient appears to be in a good mood and was engaged in assessment.   Assessment/plan status:  Psychosocial Support/Ongoing Assessment of Needs Other assessment/ plan:   Complete Fl2, Fax, PASRR   Information/referral to community resources:   CSW contact information and SNF list given.    PATIENT'S/FAMILY'S  RESPONSE TO PLAN OF CARE: Patient states that she is agreeable to DC to SNF when ready. CSW will assist.       Liz Beach MSW, [place], [place], [phone]

## 2014-02-07 NOTE — Progress Notes (Signed)
INFECTIOUS DISEASE PROGRESS NOTE  ID: Michelle [name] is a 53 y.o. female with  Principal Problem:   HCAP (healthcare-associated pneumonia) Active Problems:   Unspecified essential hypertension   HIV disease   CKD (chronic kidney disease) stage 4, GFR 15-29 ml/min   DM2 (diabetes mellitus, type 2)   Anemia of chronic disease   Unspecified protein-calorie malnutrition   Pruritus   Intermittent confusion   Acute diastolic congestive heart failure   Metabolic acidosis, increased anion gap   Elevated alkaline phosphatase level   Ascites   Dyspnea   C. difficile diarrhea   Uncontrolled hypertension  Subjective: C/o L abd/back pain radiating towards anterior.    Abtx:  Anti-infectives   Start     Dose/Rate Route Frequency Ordered Stop   02/07/14 1000  cefTAZidime (FORTAZ) 2 g in dextrose 5 % 50 mL IVPB     2 g 100 mL/hr over 30 Minutes Intravenous Every 12 hours 02/07/14 0938     02/07/14 0000  cefTAZidime 2 g in dextrose 5 % 50 mL     2 g 100 mL/hr over 30 Minutes Intravenous Every 12 hours 02/07/14 1318 02/09/14 2359   02/07/14 0000  lamiVUDine (EPIVIR) 10 MG/ML solution     150 mg Oral Daily 02/07/14 1318     02/07/14 0000  metroNIDAZOLE (FLAGYL) 500 MG tablet     500 mg Oral Every 8 hours 02/07/14 1318 02/17/14 2359   02/04/14 1000  azithromycin (ZITHROMAX) tablet 500 mg  Status:  Discontinued     500 mg Oral Daily 02/03/14 1053 02/03/14 1312   02/04/14 1000  lamiVUDine (EPIVIR) 10 MG/ML solution 150 mg  Status:  Discontinued     150 mg Oral Daily 02/04/14 0728 02/04/14 0813   02/04/14 1000  lamiVUDine (EPIVIR) tablet 150 mg     150 mg Oral Daily 02/04/14 0813     02/04/14 0600  vancomycin (VANCOCIN) IVPB 750 mg/150 ml premix  Status:  Discontinued     750 mg 150 mL/hr over 60 Minutes Intravenous Every 24 hours 02/03/14 0657 02/03/14 0858   02/04/14 0600  vancomycin (VANCOCIN) 1,250 mg in sodium chloride 0.9 % 250 mL IVPB  Status:  Discontinued     1,250 mg 166.7  mL/hr over 90 Minutes Intravenous Every 24 hours 02/03/14 0858 02/07/14 0646   02/03/14 1130  metroNIDAZOLE (FLAGYL) tablet 500 mg     500 mg Oral 3 times per day 02/03/14 1124     02/03/14 1000  lamiVUDine (EPIVIR) 10 MG/ML solution 100 mg  Status:  Discontinued     100 mg Oral Daily 02/03/14 0440 02/04/14 0728   02/03/14 1000  abacavir (ZIAGEN) tablet 600 mg     600 mg Oral Daily 02/03/14 0440     02/03/14 1000  acyclovir (ZOVIRAX) tablet 800 mg     800 mg Oral Daily 02/03/14 0440     02/03/14 1000  azithromycin (ZITHROMAX) tablet 500 mg  Status:  Discontinued     500 mg Oral Daily 02/03/14 0632 02/03/14 1046   02/03/14 0800  Darunavir Ethanolate (PREZISTA) tablet 800 mg     800 mg Oral Daily with breakfast 02/03/14 0440     02/03/14 0800  ritonavir (NORVIR) capsule 100 mg     100 mg Oral Daily with breakfast 02/03/14 0440     02/03/14 0800  vancomycin (VANCOCIN) 1,500 mg in sodium chloride 0.9 % 500 mL IVPB     1,500 mg 250 mL/hr over 120 Minutes Intravenous  Once 02/03/14 0657 02/03/14 1517   02/03/14 0730  piperacillin-tazobactam (ZOSYN) IVPB 3.375 g  Status:  Discontinued     3.375 g 12.5 mL/hr over 240 Minutes Intravenous 3 times per day 02/03/14 0657 02/07/14 0929      Medications:  Scheduled: . abacavir  600 mg Oral Daily  . acyclovir  800 mg Oral Daily  . aspirin EC  81 mg Oral Daily  . cefTAZidime (FORTAZ)  IV  2 g Intravenous Q12H  . cloNIDine  0.3 mg Transdermal Weekly  . darbepoetin (ARANESP) injection - NON-DIALYSIS  100 mcg Subcutaneous Q Fri-1800  . Darunavir Ethanolate  800 mg Oral Q breakfast  . doxazosin  4 mg Oral QHS  . furosemide  80 mg Oral BID  . heparin  5,000 Units Subcutaneous 3 times per day  . hydrALAZINE  100 mg Oral 3 times per day  . hydrocerin  1 application Topical BID  . insulin aspart  0-15 Units Subcutaneous TID WC  . insulin aspart  0-5 Units Subcutaneous QHS  . labetalol  700 mg Oral BID  . lacosamide  150 mg Oral BID  . lamiVUDine   150 mg Oral Daily  . levETIRAcetam  1,500 mg Oral BID  . metroNIDAZOLE  500 mg Oral 3 times per day  . pantoprazole  40 mg Oral Daily  . ritonavir  100 mg Oral Q breakfast  . sodium bicarbonate  650 mg Oral BID  . sodium chloride  3 mL Intravenous Q12H    Objective: Vital signs in last 24 hours: Temp:  [98 F (36.7 C)-98.9 F (37.2 C)] 98.5 F (36.9 C) (09/11 0517) Pulse Rate:  [84-88] 85 (09/11 0517) Resp:  [18-20] 20 (09/11 0517) BP: (154-170)/(63-78) 170/78 mmHg (09/11 0517) SpO2:  [94 %-98 %] 94 % (09/11 0517) Weight:  [95.2 kg (209 lb 14.1 oz)] 95.2 kg (209 lb 14.1 oz) (09/11 0517)   General appearance: alert, cooperative, no distress and morbidly obese Resp: clear to auscultation bilaterally Cardio: regular rate and rhythm GI: normal findings: bowel sounds normal and soft, non-tender  Lab Results  Recent Labs  02/06/14 0500 02/07/14 0500  WBC 5.0 3.9*  HGB 7.1* 7.0*  HCT 22.9* 22.2*  NA 142 143  K 3.8 3.6*  CL 113* 113*  CO2 15* 15*  BUN 23 23  CREATININE 2.32* 2.48*   Liver Panel  Recent Labs  02/06/14 0500  ALBUMIN 1.2*   Sedimentation Rate No results found for this basename: ESRSEDRATE,  in the last 72 hours C-Reactive Protein No results found for this basename: CRP,  in the last 72 hours  Microbiology: Recent Results (from the past 240 hour(s))  MRSA PCR SCREENING     Status: None   Collection Time    02/03/14  3:24 AM      Result Value Ref Range Status   MRSA by PCR NEGATIVE  NEGATIVE Final   Comment:            The GeneXpert MRSA Assay (FDA     approved for NASAL specimens     only), is one component of a     comprehensive MRSA colonization     surveillance program. It is not     intended to diagnose MRSA     infection nor to guide or     monitor treatment for     MRSA infections.  CULTURE, BLOOD (ROUTINE X 2)     Status: None   Collection Time    02/03/14  6:25 AM      Result Value Ref Range Status   Specimen Description BLOOD  LEFT HAND   Final   Special Requests BOTTLES DRAWN AEROBIC ONLY 3CC   Final   Culture  Setup Time     Final   Value: 02/03/2014 14:16     Performed at [facility]   Culture     Final   Value:        BLOOD CULTURE RECEIVED NO GROWTH TO DATE CULTURE WILL BE HELD FOR 5 DAYS BEFORE ISSUING A FINAL NEGATIVE REPORT     Performed at [facility]   Report Status PENDING   Incomplete  CULTURE, BLOOD (ROUTINE X 2)     Status: None   Collection Time    02/03/14  6:35 AM      Result Value Ref Range Status   Specimen Description BLOOD LEFT WRIST   Final   Special Requests     Final   Value: BOTTLES DRAWN AEROBIC AND ANAEROBIC BLUE 10CC RED 5CC   Culture  Setup Time     Final   Value: 02/03/2014 14:16     Performed at [facility]   Culture     Final   Value:        BLOOD CULTURE RECEIVED NO GROWTH TO DATE CULTURE WILL BE HELD FOR 5 DAYS BEFORE ISSUING A FINAL NEGATIVE REPORT     Performed at [facility]   Report Status PENDING   Incomplete  CLOSTRIDIUM DIFFICILE BY PCR     Status: Abnormal   Collection Time    02/03/14  6:55 AM      Result Value Ref Range Status   C difficile by pcr POSITIVE (*) NEGATIVE Final   Comment: CRITICAL RESULT CALLED TO, READ BACK BY AND VERIFIED WITH:     DECENA RN 11:00 02/03/14 ([email or web address])    Studies/Results: Dg Chest 2 View  02/05/2014   CLINICAL DATA:  Shortness of breath, pain in lower abdomen  EXAM: CHEST  2 VIEW  COMPARISON:  02/03/2014  FINDINGS: Mild cardiac enlargement stable. Mild vascular congestion. Consolidation in the right lower lobe similar to prior study, with stable small effusion. Infiltrate in the left upper lobe, similar to slightly more prominent. Stable left perihilar infiltrate. Stable right central line.  IMPRESSION: Right lower lobe consolidation with small effusion, stable. Left perihilar and upper lobe infiltrate slightly worse.   Electronically Signed   By: Esperanza Heir M.D.   On: 02/05/2014 17:31      Assessment/Plan: HIV+  ESRD  HTN  C diff+  DM2  Hepatitis C  Severe Protein Calorie Malnutrition   Total days of antibiotics 5 (vanco/zosyn/flagyl)  Would-  Plan for d/c (she does not feel like she is ready to go...) Would continue flagyl for 14 days  Continue vanco/ceftaz for 8 days total, if she is d/c before 8th day, can get ceftaz and vanco at [facility].  Available if questions          Johny Sax Infectious Diseases (pager) [phone] [email or web address] 02/07/2014, 2:19 PM  LOS: 4 days

## 2014-02-07 NOTE — Discharge Summary (Signed)
Name: Michelle [name] MRN: [id number] DOB: 1961-05-25 53 y.o. PCP: Christen Bame, MD  Date of Admission: 02/03/2014  3:14 AM Date of Discharge: 02/07/2014 Attending Physician: Farley Ly, MD  Discharge Diagnosis: Principal Problem:   HCAP (healthcare-associated pneumonia) Active Problems:   Unspecified essential hypertension   HIV disease   CKD (chronic kidney disease) stage 4, GFR 15-29 ml/min   DM2 (diabetes mellitus, type 2)   Anemia of chronic disease   Unspecified protein-calorie malnutrition   Pruritus   Intermittent confusion   Acute diastolic congestive heart failure   Metabolic acidosis, increased anion gap   Elevated alkaline phosphatase level   Ascites   Dyspnea   C. difficile diarrhea   Uncontrolled hypertension  Discharge Medications:   Medication List    STOP taking these medications       ceFAZolin 1-5 GM-%  Commonly known as:  ANCEF      TAKE these medications       abacavir 300 MG tablet  Commonly known as:  ZIAGEN  Take 600 mg by mouth daily.     acetaminophen 325 MG tablet  Commonly known as:  TYLENOL  Take 650 mg by mouth every 6 (six) hours as needed (Fever).     acyclovir 800 MG tablet  Commonly known as:  ZOVIRAX  Take 800 mg by mouth daily.     aspirin 81 MG EC tablet  Take 1 tablet (81 mg total) by mouth daily.     cefTAZidime 2 g in dextrose 5 % 50 mL  Inject 2 g into the vein daily.  Start taking on:  02/08/2014     cloNIDine 0.3 mg/24hr patch  Commonly known as:  CATAPRES - Dosed in mg/24 hr  Place 1 patch (0.3 mg total) onto the skin once a week.     darbepoetin 200 MCG/0.4ML Soln injection  Commonly known as:  ARANESP  Inject 0.4 mLs (200 mcg total) into the skin every 14 (fourteen) days.     doxazosin 4 MG tablet  Commonly known as:  CARDURA  Take 1 tablet (4 mg total) by mouth at bedtime.     furosemide 80 MG tablet  Commonly known as:  LASIX  Take 1 tablet (80 mg total) by mouth 2 (two) times daily.     hydrALAZINE 100 MG tablet  Commonly known as:  APRESOLINE  Take 1 tablet (100 mg total) by mouth every 8 (eight) hours.     hydrocerin Crea  Apply 1 application topically 2 (two) times daily.     hydrOXYzine 10 MG tablet  Commonly known as:  ATARAX/VISTARIL  Take 1 tablet (10 mg total) by mouth 3 (three) times daily as needed for itching.     insulin aspart 100 UNIT/ML injection  Commonly known as:  novoLOG  Inject 10-25 Units into the skin 3 (three) times daily before meals. Based on sliding scale     labetalol 200 MG tablet  Commonly known as:  NORMODYNE  Take 4 tablets (800 mg total) by mouth 2 (two) times daily.     Lacosamide 150 MG Tabs  Take 1 tablet (150 mg total) by mouth 2 (two) times daily.     lactulose 10 GM/15ML solution  Commonly known as:  CHRONULAC  Take 30 mLs (20 g total) by mouth 3 (three) times daily.     lamiVUDine 10 MG/ML solution  Commonly known as:  EPIVIR  Take 15 mLs (150 mg total) by mouth daily.  levETIRAcetam 500 MG tablet  Commonly known as:  KEPPRA  Take 1,500 mg by mouth 2 (two) times daily.     metroNIDAZOLE 500 MG tablet  Commonly known as:  FLAGYL  Take 1 tablet (500 mg total) by mouth every 8 (eight) hours.     omeprazole 20 MG capsule  Commonly known as:  PRILOSEC  Take 20 mg by mouth daily before breakfast.     PREZISTA 800 MG tablet  Generic drug:  Darunavir Ethanolate  Take 800 mg by mouth daily with breakfast.     ritonavir 100 MG capsule  Commonly known as:  NORVIR  Take 100 mg by mouth daily with breakfast.     sodium bicarbonate 650 MG tablet  Take 650 mg by mouth 2 (two) times daily.     vancomycin in sodium chloride 0.9 % 250 mL  Please obtain random Vancomycin level the morning of 02/08/2014 and adjust dose as appropriate. She is on day 5 of 8 day course as of 02/07/2014  Start taking on:  02/08/2014      **Vancomycin trough 37.3 on 02/07/2014 at 0530. Received  02/03/2014 13:17,  02/04/2014 05:35,   02/05/2014 05:30,  02/06/2014 06:24. Held 02/07/2014.  **Ceftazidime 3 more doses (on day 5) to complete 8 day course.  **Flagyl to continue for 10 more days (on day 4) to complete 14 day course.  Disposition and follow-up:   [name] was discharged from [facility] in Stable condition.  At the hospital follow up visit please address:  1.  HCAP and C. Diff resolution. Medication regimen as noted above.  2.  Please follow up her blood pressure control as many of her meds have changed.  If BP meds need to be tapered, favor labetalol and/or doxazosin per nephrology.  3.  Labs / imaging needed at time of follow-up: Chest XR, BMP, CBC  4.  Pending labs/ test needing follow-up: none  Follow-up Appointments:     Follow-up Information   Follow up with Kennis Carina, MD On 02/17/2014. [facility] follow up at 10:15AM)    Specialty:  Internal Medicine   Contact information:   [address] [place] [address] [phone]       Discharge Instructions: Discharge Instructions   Call MD for:  difficulty breathing, headache or visual disturbances    Complete by:  As directed      Call MD for:  persistant dizziness or light-headedness    Complete by:  As directed      Call MD for:  persistant nausea and vomiting    Complete by:  As directed      Call MD for:  severe uncontrolled pain    Complete by:  As directed      Call MD for:  temperature >100.4    Complete by:  As directed      Diet - low sodium heart healthy    Complete by:  As directed      Increase activity slowly    Complete by:  As directed            Consultations: Treatment Team:  Hartley Barefoot. Allena Katz, MD (Nephrology) Johny Sax, MD (Infectious Disease)  Procedures Performed:  Dg Chest 2 View  02/05/2014   CLINICAL DATA:  Shortness of breath, pain in lower abdomen  EXAM: CHEST  2 VIEW  COMPARISON:  02/03/2014  FINDINGS: Mild cardiac enlargement stable. Mild vascular congestion.  Consolidation in the right lower lobe similar to prior  study, with stable small effusion. Infiltrate in the left upper lobe, similar to slightly more prominent. Stable left perihilar infiltrate. Stable right central line.  IMPRESSION: Right lower lobe consolidation with small effusion, stable. Left perihilar and upper lobe infiltrate slightly worse.   Electronically Signed   By: Esperanza Heir M.D.   On: 02/05/2014 17:31   Dg Chest 2 View  02/03/2014   CLINICAL DATA:  Dyspnea.  Recent history of HC AP  EXAM: CHEST  2 VIEW  COMPARISON:  02/02/2014  FINDINGS: Shallow inspiration with elevation of the right hemidiaphragm. Increasing consolidation or atelectasis in the right lung base with persistent patchy airspace disease in the left mid and upper lung. Probable small right pleural effusion. No pneumothorax. Right central venous catheter is unchanged in position. Normal heart size and pulmonary vascularity.  IMPRESSION: Increasing consolidation and effusion in the right lung base with persistent focal infiltration in the left mid/ upper lung. Changes likely represent multifocal pneumonia.   Electronically Signed   By: Burman Nieves M.D.   On: 02/03/2014 05:54   US Abdomen Limited  02/03/2014   CLINICAL DATA:  Ascites, abdominal distention  EXAM: LIMITED ABDOMINAL ULTRASOUND  COMPARISON:  CT 01/10/2014  FINDINGS: No significant ascites identified at evaluation of the 4 abdominal quadrants.  IMPRESSION: No ascites identified.   Electronically Signed   By: Christiana Pellant M.D.   On: 02/03/2014 15:10   Lower Extremity Venous Duplex Bilateral:  Summary:  - No evidence of deep vein thrombosis involving the visualized veins of the right lower extremity and left lower extremity. - No evidence of Baker&'s cyst on the right or left. 02/03/2014  Admission HPI: Patient is a 53 year old female with a history of diastolic congestive heart failure, end-stage liver disease, HIV disease, stage IV chronic kidney disease,  type 2 diabetes, anemia chronic disease, and seizures who presents with reported shortness of breath and chest pain.  Patient was recently admitted to the internal medicine service between 01/20/2014 and 01/24/2014 for HCAP with acute hypoxic respiratory failure. After a positive for MSSA blood culture, patient was switched from IV vancomycin and Zosyn to IV Ancef (scheduled to finish 02/03/2014) along with a course of Levaquin (scheduled to finish 01/23/2014).   IMTS called by Jeanmarie Hubert of [facility] who informed that patient had been sent from her rehabilitation facility due to shortness of breath and chest pain. He felt that she should be admitted, however, internal medicine at [facility] did not feel prepared given her HIV status and CKD and requested a hospital to hospital transfer since she receives her care here and was recently discharged.   Patient lacks insight into reason for hospitalization and is unable to provide elements of the history of present illness herself. Patient denies any fevers, chills, dyspnea, chest pain, abdominal pain, or dysuria upon exam. Patient does endorse general pruritus and some suprapubic tightness. No fevers reported by her nursing home.  Hospital Course by problem list: Principal Problem:   HCAP (healthcare-associated pneumonia) Active Problems:   Unspecified essential hypertension   HIV disease   CKD (chronic kidney disease) stage 4, GFR 15-29 ml/min   DM2 (diabetes mellitus, type 2)   Anemia of chronic disease   Unspecified protein-calorie malnutrition   Pruritus   Intermittent confusion   Acute diastolic congestive heart failure   Metabolic acidosis, increased anion gap   Elevated alkaline phosphatase level   Ascites   Dyspnea   C. difficile diarrhea   Uncontrolled hypertension   1.  HCAP: Patient was sent from nursing home for shortness of breath and chest pain. Patient with an elevated BNP of 7540 with the highest prior in the  system at 1277 on May 2015. Patient is up to 206 pounds, up from 190 on January 20, 2014. Patient presented with marked hypertension. Last echo on file from August 2015 showed ejection fraction of 60-65% and grade 1 diastolic dysfunction. Outside chest x-[place] with comparison from 01/19/2014 noted a mildly increased left upper lobe opacity suspicious for pneumonia. CXR 2 view done here 02/03/2014 shows increasing consolidation and effusion in the right lung base with persistent focal infiltration in the left mid/ upper lung, likely representing multifocal pneumonia. Patient is afebrile and there is no leukocytosis, satting well on room air. Favored HCAP as primary cause of dyspnea as she appears euvolemic on exam. Received Lasix  x 1 02/03/2014. LE venous duplex limited but negative for DVT. ID consulted. Blood cultures NGTD. Legionella urine antigen was negative. She was started on vancomycin, zosyn, and azithromycin. Azithromycin was discontinued after one dose as there was less of a concern for coverage of atypical pneumonia with her CD4 count greater than 200 and HIV VL undetectable - repeat of those labs confirmed that she is still greater than 200 and undetectable viral load. She was transitioned to vanc and ceftazidime 02/07/2014 and is to finish an 8 day course per ID. Vancomycin was held on 02/07/2014 as her trough was elevated. Pharmacy recommended checking random vancomycin level 02/08/2014 and restart at appropriate dose. At discharge her dyspnea had resolved.  2. C Diff: positive on PCR, no past C diff in chart. Diarrhea improving on Flagyl PO  TID to complete 14 day course.   3. Hypertension: Patient presented to outside hospital with blood pressure 240/119. Diagnosed as hypertensive emergency due to mental status change. Patient's mental status, though abnormal, is consistent with her baseline during her last hospitalization. Patient placed on nitroglycerin drip at OSH, and patient was 160-170s  systolic upon exam, which represents too precipitous of a decline. Discontinued nitro drip upon arrival and she received Lasix  IV x 1. Her home doses of labetalol  BID, clonidine patch, and hydralazine  TID were continued. Labetalol was increased to  BID. Nephrology was consulted as her BP remained elevated. Doxazosin  QHS was started with better control of her BP. Labetalol was increased to  BID. Her lasix  BID PO was also restarted by nephrology.  4. 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 Ammonia 33, down from 161 on 01/04/2014. Oriented to place, time, and self. However, lacks insight into reason for hospitalization. Elevated alkaline phosphatase level 703 (585 on 01/20/2014) and total bilirubin at 2.5 (3.6 on 01/20/2014) now down to 598 and 2.1 respectively. Ammonia level 70 on 02/03/2914. US abdomen with no significant ascites. Lactulose initially held as patient was having diarrhea from C. Diff. Can continue home lactulose 20 g 3 times a day if continues to have less than 3 BM.  5. Chest pain: Nursing home in outside emergency department reporting that patient had chest pain. On exam, patient no longer reporting chest pain, though has poor insight into reason for hospitalization. Troponin negative x1 at outside hospital. Troponin negative x 2, EKG with no ischemic changes on repeat. Likely musculoskeletal etiology.  6. Anion gap metabolic acidosis: Anion gap of 14 with CO2 of 14. Lactic acid 1.2 at OSH. Chronic, likely due to chronic kidney disease.  CO2 increased to 15-16 and remained stable.. Home sodium bicarb was continued.  7. Anemia of chronic disease: Hemoglobin 8.0, close to her baseline. Hgb trended downward to 7 but she did not require transfusion. Aranesp was resumed per nephrology.  8. HIV (CD4: 240, VL <20 In August 2015). Repeat CD4 and VL stable. Continued home lamivudine, ritonavir,  darunavir, and abacavir. Continued acyclovir for prophylaxis.  9. Seizure disorder: Continued home Keppra and lacosamide. No episodes of seizure during this hospitalization.  Discharge Vitals:   BP 170/78  Pulse 85  Temp(Src) 98.5 F (36.9 C) (Oral)  Resp 20  Ht  (1.651 m)  Wt 209 lb 14.1 oz (95.2 kg)  BMI 34.93 kg/m2  SpO2 94%  Discharge Labs:  Results for orders placed during the hospital encounter of 02/03/14 (from the past 24 hour(s))  URINALYSIS, ROUTINE W REFLEX MICROSCOPIC     Status: Abnormal   Collection Time    02/06/14  4:24 PM      Result Value Ref Range   Color, Urine AMBER (*) YELLOW   APPearance CLOUDY (*) CLEAR   Specific Gravity, Urine 1.019  1.005 - 1.030   pH 5.5  5.0 - 8.0   Glucose, UA 250 (*) NEGATIVE mg/dL   Hgb urine dipstick NEGATIVE  NEGATIVE   Bilirubin Urine SMALL (*) NEGATIVE   Ketones, ur NEGATIVE  NEGATIVE mg/dL   Protein, ur >161 (*) NEGATIVE mg/dL   Urobilinogen, UA 0.2  0.0 - 1.0 mg/dL   Nitrite NEGATIVE  NEGATIVE   Leukocytes, UA NEGATIVE  NEGATIVE  URINE MICROSCOPIC-ADD ON     Status: None   Collection Time    02/06/14  4:24 PM      Result Value Ref Range   Squamous Epithelial / LPF RARE  RARE   Urine-Other FEW YEAST    GLUCOSE, CAPILLARY     Status: Abnormal   Collection Time    02/06/14  5:17 PM      Result Value Ref Range   Glucose-Capillary 193 (*) 70 - 99 mg/dL  GLUCOSE, CAPILLARY     Status: Abnormal   Collection Time    02/06/14  9:04 PM      Result Value Ref Range   Glucose-Capillary 176 (*) 70 - 99 mg/dL  BASIC METABOLIC PANEL     Status: Abnormal   Collection Time    02/07/14  5:00 AM      Result Value Ref Range   Sodium 143  137 - 147 mEq/L   Potassium 3.6 (*) 3.7 - 5.3 mEq/L   Chloride 113 (*) 96 - 112 mEq/L   CO2 15 (*) 19 - 32 mEq/L   Glucose, Bld 151 (*) 70 - 99 mg/dL   BUN 23  6 - 23 mg/dL   Creatinine, Ser 0.96 (*) 0.50 - 1.10 mg/dL   Calcium 7.0 (*) 8.4 - 10.5 mg/dL   GFR calc non Af Amer 21 (*)  >90 mL/min   GFR calc Af Amer 24 (*) >90 mL/min   Anion gap 15  5 - 15  CBC     Status: Abnormal   Collection Time    02/07/14  5:00 AM      Result Value Ref Range   WBC 3.9 (*) 4.0 - 10.5 K/uL   RBC 2.17 (*) 3.87 - 5.11 MIL/uL   Hemoglobin 7.0 (*) 12.0 - 15.0 g/dL   HCT 04.5 (*) 40.9 - 81.1 %   MCV 102.3 (*) 78.0 - 100.0 fL  MCH 32.3  26.0 - 34.0 pg   MCHC 31.5  30.0 - 36.0 g/dL   RDW 67.5 (*) 44.9 - 20.1 %   Platelets 153  150 - 400 K/uL  VANCOMYCIN, TROUGH     Status: Abnormal   Collection Time    02/07/14  5:30 AM      Result Value Ref Range   Vancomycin Tr 37.3 (*) 10.0 - 20.0 ug/mL  GLUCOSE, CAPILLARY     Status: Abnormal   Collection Time    02/07/14  7:59 AM      Result Value Ref Range   Glucose-Capillary 147 (*) 70 - 99 mg/dL  GLUCOSE, CAPILLARY     Status: Abnormal   Collection Time    02/07/14 12:23 PM      Result Value Ref Range   Glucose-Capillary 253 (*) 70 - 99 mg/dL    Signed: Griffin Basil, MD 02/07/2014, 1:38 PM    Services Ordered on Discharge: Discharged to SNF  Equipment Ordered on Discharge: None

## 2014-02-07 NOTE — Progress Notes (Signed)
Updated d/c summary faxed to [facility], per MD request.

## 2014-02-07 NOTE — Progress Notes (Signed)
DC VIA AMBULANCE TO  REHAB AT THIS TIME.

## 2014-02-07 NOTE — Clinical Social Work Placement (Signed)
Clinical Social Work Department CLINICAL SOCIAL WORK PLACEMENT NOTE 02/07/2014  Patient:  Michelle [name], Michelle [name]  Account Number:  [id number] Admit date:  02/03/2014  Clinical Social Worker:  Cherre Blanc, [place]  Date/time:  02/07/2014 12:38 PM  Clinical Social Work is seeking post-discharge placement for this patient at the following level of care:   SKILLED NURSING   (*CSW will update this form in Epic as items are completed)   02/07/2014  Patient/family provided with [facility] Department of Clinical Social Work's list of facilities offering this level of care within the geographic area requested by the patient (or if unable, by the patient's family).  02/07/2014  Patient/family informed of their freedom to choose among providers that offer the needed level of care, that participate in Medicare, Medicaid or managed care program needed by the patient, have an available bed and are willing to accept the patient.  02/07/2014  Patient/family informed of [facility]' ownership interest in [facility], as well as of the fact that they are under no obligation to receive care at this facility.  PASARR submitted to EDS on  PASARR number received on   FL2 transmitted to all facilities in geographic area requested by pt/family on  02/07/2014 FL2 transmitted to all facilities within larger geographic area on   Patient informed that his/her managed care company has contracts with or will negotiate with  certain facilities, including the following:     Patient/family informed of bed offers received:   Patient chooses bed at  Physician recommends and patient chooses bed at    Patient to be transferred to  on   Patient to be transferred to facility by  Patient and family notified of transfer on  Name of family member notified:    The following physician request were entered in Epic: Physician Request  Please sign FL2.    Additional Comments:    Roddie Mc MSW, [place],  [place], [phone]

## 2014-02-07 NOTE — Progress Notes (Signed)
Internal Medicine Attending  Date: 02/07/2014  Patient name: Michelle [name] Medical record number: [id number] Date of birth: May 20, 1961 Age: 53 y.o. Gender: female  I saw and evaluated the patient. I discussed patient and reviewed the resident's note by Dr. Isabella Bowens, and I agree with the resident's findings and plans as documented in her note, with the following additional comments.  Plan is to increase labetalol to a dose of 800 mg twice a day given persistent moderate elevations in blood pressure.

## 2014-02-09 LAB — CULTURE, BLOOD (ROUTINE X 2)
CULTURE: NO GROWTH
Culture: NO GROWTH

## 2014-02-17 ENCOUNTER — Ambulatory Visit: Payer: Self-pay | Admitting: Internal Medicine

## 2014-03-03 ENCOUNTER — Encounter: Payer: Self-pay | Admitting: Internal Medicine

## 2014-03-03 ENCOUNTER — Encounter: Payer: Self-pay | Admitting: *Deleted

## 2014-03-03 ENCOUNTER — Ambulatory Visit (INDEPENDENT_AMBULATORY_CARE_PROVIDER_SITE_OTHER): Payer: Medicaid Other | Admitting: Internal Medicine

## 2014-03-03 VITALS — BP 155/77 | HR 80 | Temp 98.0°F | Wt 195.0 lb

## 2014-03-03 DIAGNOSIS — D638 Anemia in other chronic diseases classified elsewhere: Secondary | ICD-10-CM

## 2014-03-03 DIAGNOSIS — I10 Essential (primary) hypertension: Secondary | ICD-10-CM

## 2014-03-03 DIAGNOSIS — J189 Pneumonia, unspecified organism: Secondary | ICD-10-CM

## 2014-03-03 LAB — GLUCOSE, CAPILLARY: GLUCOSE-CAPILLARY: 228 mg/dL — AB (ref 70–99)

## 2014-03-03 NOTE — Assessment & Plan Note (Addendum)
BP Readings from Last 3 Encounters:  03/03/14 155/77  02/07/14 180/84  01/24/14 135/71    Lab Results  Component Value Date   NA 143 02/07/2014   K 3.6* 02/07/2014   CREATININE 2.48* 02/07/2014    Assessment: Blood pressure control: moderately elevated Progress toward BP goal:  improved Comments: She is followed by Nephrology in addition to her PCP for her blood pressure. She is on a clonidine patch, and hydralazine 100mg  TID. Labetalol was increased to 800mg  BID. Lasix 80mg  BID per nephrology in addition to Doxazosin 4mg  daily.  Plan: Medications:  Continue current medications Educational resources provided:   Self management tools provided:   Other plans: Will need to get records from Nephrologist Checking BMP today F/u with PCP in 2 weeks.

## 2014-03-03 NOTE — Patient Instructions (Addendum)
Be sure to take all of your medications as prescribed.   You can take Tylenol 650mg   Every 6-8 hours as needed for pain, do not exceed 3g (3000mg )   General Instructions:   Please bring your medicines with you each time you come to clinic.  Medicines may include prescription medications, over-the-counter medications, herbal remedies, eye drops, vitamins, or other pills.   Progress Toward Treatment Goals:  Treatment Goal 03/03/2014  Blood pressure unchanged    Self Care Goals & Plans:  Self Care Goal 12/13/2013  Manage my medications take my medicines as prescribed; bring my medications to every visit; refill my medications on time  Monitor my health keep track of my blood glucose; bring my glucose meter and log to each visit  Eat healthy foods drink diet soda or water instead of juice or soda; eat more vegetables; eat foods that are low in salt; eat baked foods instead of fried foods; eat fruit for snacks and desserts    No flowsheet data found.   Care Management & Community Referrals:  No flowsheet data found.

## 2014-03-03 NOTE — Assessment & Plan Note (Signed)
Last Hgb 7.0 on 02/07/14. Repeating CBC today.

## 2014-03-03 NOTE — Progress Notes (Signed)
Patient ID: Michelle [name], female   DOB: September 12, 1960, 53 y.o.   MRN: [id number]  Subjective:   Patient ID: Michelle [name] female   DOB: [birth year] 53 y.o.   MRN: [id number]  HPI: [name] is a 53 y.o. F w/ PMH diastolic congestive heart failure, end-stage liver disease, HIV disease, stage IV chronic kidney disease, type 2 diabetes, anemia chronic disease, and seizures who presents for a hospital f/u after being discharged from Rehab.  She was treated for HCAP, C. Diff diarrhea, and hypertensive emergency. She was discharged on Vanc and Ceftazidine and was to remain on the medications for 8 days. She was also treated with po Flagyl x14 days. For her blood pressure, she was resumed on her home meds and the Labetalol was increased to 700mg  BID. Her BPs remained elevated and Renal was consulted and started Doxazosin and her Lasix 80mg  BID was restarted as well.    Per her sister, she was d/c'd from Rehab on Thurs, 10/1. Since that time, she has been unable to ambulate and is almost a full assist at home. The sister states that she is not ambulating, but the pt states that she is able to ambulate but just feels more fatigued.   She did not take her afternoon medications prior to arriving to the clinic.   She is requesting pain medication, "10-325s."    Past Medical History  Diagnosis Date  . Meningitis   . HIV (human immunodeficiency virus infection) 1981  . Hypertension   . Gout   . Muscle spasms of head and/or neck   . Hypertensive encephalopathy ~ 05/2013    hospitalaized/notes 06/18/2013  . Anxiety     [name] 06/18/2013  . History of syphilis   . High cholesterol   . Cirrhosis     hepatitis C   . GERD (gastroesophageal reflux disease)   . Psychosis   . Chronic venous stasis dermatitis of both lower extremities   . CRD (chronic renal disease), stage IV   . Nephrotic syndrome   . CHF (congestive heart failure)     "one time" (01/22/2014)  . Myocardial infarction 2015    "ealy part of  this year"  . End stage liver disease   . AIHA (autoimmune hemolytic anemia)     [name] 06/18/2013  . Anemia   . HCAP (healthcare-associated pneumonia) 12/2013  . Exertional shortness of breath   . Type II diabetes mellitus   . History of blood transfusion 12/2013 X ?    "I've had a few recently" (01/22/2014)  . Hepatitis C     pt denies this hx on 01/22/2014  . Migraine     "all the time" (01/22/2014)  . Seizures     "last sz was 10/2013" (01/22/2014)  . Stroke     "I've had a few; I've recovered from all of them; had one earlier this year too" (01/22/2014)  . Arthritis     "all over; comes and goes" (01/22/2014)  . Chronic back pain     "on and off" (01/22/2014)   Current Outpatient Prescriptions  Medication Sig Dispense Refill  . abacavir (ZIAGEN) 300 MG tablet Take 600 mg by mouth daily.       [name] acetaminophen (TYLENOL) 325 MG tablet Take 650 mg by mouth every 6 (six) hours as needed (Fever).      [name] acyclovir (ZOVIRAX) 800 MG tablet Take 800 mg by mouth daily.       [name] aspirin EC 81 MG  EC tablet Take 1 tablet (81 mg total) by mouth daily.      . cloNIDine (CATAPRES - DOSED IN MG/24 HR) 0.3 mg/24hr patch Place 1 patch (0.3 mg total) onto the skin once a week.  4 patch  12  . darbepoetin (ARANESP) 200 MCG/0.4ML SOLN injection Inject 0.4 mLs (200 mcg total) into the skin every 14 (fourteen) days.  1.68 mL    . Darunavir Ethanolate (PREZISTA) 800 MG tablet Take 800 mg by mouth daily with breakfast.      . doxazosin (CARDURA) 4 MG tablet Take 1 tablet (4 mg total) by mouth at bedtime.      . furosemide (LASIX) 80 MG tablet Take 1 tablet (80 mg total) by mouth 2 (two) times daily.      . hydrALAZINE (APRESOLINE) 100 MG tablet Take 1 tablet (100 mg total) by mouth every 8 (eight) hours.  90 tablet  3  . hydrocerin (EUCERIN) CREA Apply 1 application topically 2 (two) times daily.  113 g  0  . hydrOXYzine (ATARAX/VISTARIL) 10 MG tablet Take 1 tablet (10 mg total) by mouth 3 (three) times daily as  needed for itching.  30 tablet  0  . insulin aspart (NOVOLOG) 100 UNIT/ML injection Inject 10-25 Units into the skin 3 (three) times daily before meals. Based on sliding scale      . labetalol (NORMODYNE) 200 MG tablet Take 4 tablets (800 mg total) by mouth 2 (two) times daily.      [name]. lacosamide 150 MG TABS Take 1 tablet (150 mg total) by mouth 2 (two) times daily.  60 tablet    . lactulose (CHRONULAC) 10 GM/15ML solution Take 30 mLs (20 g total) by mouth 3 (three) times daily.  240 mL  0  . lamiVUDine (EPIVIR) 10 MG/ML solution Take 15 mLs (150 mg total) by mouth daily.  240 mL  12  . levETIRAcetam (KEPPRA) 500 MG tablet Take 1,500 mg by mouth 2 (two) times daily.      [name]. omeprazole (PRILOSEC) 20 MG capsule Take 20 mg by mouth daily before breakfast.      . ritonavir (NORVIR) 100 MG capsule Take 100 mg by mouth daily with breakfast.      . sodium bicarbonate 650 MG tablet Take 650 mg by mouth 2 (two) times daily.       No current facility-administered medications for this visit.   Family History  Problem Relation Age of Onset  . Cancer - Colon Mother   . Cancer Father   . Hypertension Father   . Diabetes    . Diabetes Sister    History   Social History  . Marital Status: Single    Spouse Name: N/A    Number of Children: 4  . Years of Education: 11th   Social History Main Topics  . Smoking status: Former Smoker    Types: Cigarettes    Quit date: 06/19/2010  . Smokeless tobacco: Never Used  . Alcohol Use: No  . Drug Use: Yes    Special: Cocaine, Marijuana, "Crack" cocaine     Comment: past history of alcohol, cocaine and IV drug use; "stopped drugs in 2012"  . Sexual Activity: Not Currently    Partners: Male   Other Topics Concern  . None   Social History Narrative   Patient lives at home with sister.    Patient is unemployed.    Patient is single.    Patient has 2 alive and 2 deceased.  Patient has 11th grade education.          Review of  Systems: Constitutional: Denies fever, chills. + fatigue.  Respiratory: +SOB and DOE.   Cardiovascular: Denies chest pain, palpitations. +leg swelling.  Gastrointestinal: Denies nausea, vomiting, abdominal pain Musculoskeletal: +diffuse pain  Neurological: +headaches.  Psychiatric/Behavioral: Denies suicidal ideation, mood changes  Objective:  Physical Exam: Filed Vitals:   03/03/14 1501 03/03/14 1547  BP: 182/90 155/77  Pulse: 84 80  Temp: 98 F (36.7 C)   TempSrc: Oral   Weight: 195 lb (88.451 kg)   SpO2: 99%    Constitutional: Vital signs reviewed.  Patient is a well-developed and well-nourished female in no acute distress and cooperative with exam. Alert and oriented x3.  Head: Normocephalic and atraumatic Mouth: Poor dentition Eyes: PERRL, EOMI Cardiovascular: RRR, no MRG Pulmonary/Chest: Normal respiratory effort, CTAB, no wheezes, rales, or rhonchi Abdominal: Soft. Non-tender, non-distended Musculoskeletal: Sitting in a wheelchair. Moves all 4 extremities  Neurological: A&O x3 Skin: BLE with chronic vascular changes Psychiatric: Normal mood and affect. speech and behavior is normal.   Assessment & Plan:   Please refer to Problem List based Assessment and Plan

## 2014-03-03 NOTE — Assessment & Plan Note (Addendum)
Pt completed abx course. Breathing better. She will need a CXR in 4-6 weeks from resolution of symptoms. Checking CBC today

## 2014-03-04 LAB — CBC
HCT: 24.9 % — ABNORMAL LOW (ref 36.0–46.0)
Hemoglobin: 7.9 g/dL — ABNORMAL LOW (ref 12.0–15.0)
MCH: 32 pg (ref 26.0–34.0)
MCHC: 31.7 g/dL (ref 30.0–36.0)
MCV: 100.8 fL — ABNORMAL HIGH (ref 78.0–100.0)
PLATELETS: 198 10*3/uL (ref 150–400)
RBC: 2.47 MIL/uL — ABNORMAL LOW (ref 3.87–5.11)
RDW: 15.7 % — AB (ref 11.5–15.5)
WBC: 4.6 10*3/uL (ref 4.0–10.5)

## 2014-03-04 LAB — BASIC METABOLIC PANEL WITH GFR
BUN: 24 mg/dL — ABNORMAL HIGH (ref 6–23)
CHLORIDE: 113 meq/L — AB (ref 96–112)
CO2: 17 mEq/L — ABNORMAL LOW (ref 19–32)
Calcium: 7.3 mg/dL — ABNORMAL LOW (ref 8.4–10.5)
Creat: 2.49 mg/dL — ABNORMAL HIGH (ref 0.50–1.10)
GFR, EST NON AFRICAN AMERICAN: 21 mL/min — AB
GFR, Est African American: 24 mL/min — ABNORMAL LOW
Glucose, Bld: 228 mg/dL — ABNORMAL HIGH (ref 70–99)
POTASSIUM: 3.9 meq/L (ref 3.5–5.3)
SODIUM: 141 meq/L (ref 135–145)

## 2014-03-05 NOTE — Progress Notes (Signed)
Case discussed with Dr. Glenn soon after the resident saw the patient.  We reviewed the resident's history and exam and pertinent patient test results.  I agree with the assessment, diagnosis and plan of care documented in the resident's note. 

## 2014-03-06 ENCOUNTER — Inpatient Hospital Stay (HOSPITAL_COMMUNITY): Payer: Medicaid Other

## 2014-03-06 ENCOUNTER — Emergency Department (HOSPITAL_COMMUNITY): Payer: Medicaid Other

## 2014-03-06 ENCOUNTER — Other Ambulatory Visit: Payer: Self-pay

## 2014-03-06 ENCOUNTER — Encounter (HOSPITAL_COMMUNITY): Payer: Self-pay | Admitting: Emergency Medicine

## 2014-03-06 ENCOUNTER — Inpatient Hospital Stay (HOSPITAL_COMMUNITY)
Admission: EM | Admit: 2014-03-06 | Discharge: 2014-03-06 | DRG: 977 | Disposition: A | Discharge status: Skilled Nursing Facility | Payer: Medicaid Other | Attending: Oncology | Admitting: Oncology

## 2014-03-06 DIAGNOSIS — R0600 Dyspnea, unspecified: Secondary | ICD-10-CM | POA: Diagnosis present

## 2014-03-06 DIAGNOSIS — B20 Human immunodeficiency virus [HIV] disease: Secondary | ICD-10-CM | POA: Diagnosis present

## 2014-03-06 DIAGNOSIS — R531 Weakness: Secondary | ICD-10-CM

## 2014-03-06 DIAGNOSIS — I5032 Chronic diastolic (congestive) heart failure: Secondary | ICD-10-CM | POA: Diagnosis present

## 2014-03-06 DIAGNOSIS — R5381 Other malaise: Secondary | ICD-10-CM | POA: Diagnosis present

## 2014-03-06 DIAGNOSIS — K7210 Chronic hepatic failure without coma: Secondary | ICD-10-CM | POA: Diagnosis present

## 2014-03-06 DIAGNOSIS — R509 Fever, unspecified: Secondary | ICD-10-CM | POA: Diagnosis present

## 2014-03-06 DIAGNOSIS — J9610 Chronic respiratory failure, unspecified whether with hypoxia or hypercapnia: Secondary | ICD-10-CM

## 2014-03-06 DIAGNOSIS — Z8673 Personal history of transient ischemic attack (TIA), and cerebral infarction without residual deficits: Secondary | ICD-10-CM

## 2014-03-06 DIAGNOSIS — K219 Gastro-esophageal reflux disease without esophagitis: Secondary | ICD-10-CM | POA: Diagnosis present

## 2014-03-06 DIAGNOSIS — Z21 Asymptomatic human immunodeficiency virus [HIV] infection status: Secondary | ICD-10-CM

## 2014-03-06 DIAGNOSIS — D638 Anemia in other chronic diseases classified elsewhere: Secondary | ICD-10-CM | POA: Diagnosis present

## 2014-03-06 DIAGNOSIS — K7290 Hepatic failure, unspecified without coma: Secondary | ICD-10-CM | POA: Diagnosis present

## 2014-03-06 DIAGNOSIS — B182 Chronic viral hepatitis C: Secondary | ICD-10-CM | POA: Diagnosis present

## 2014-03-06 DIAGNOSIS — I129 Hypertensive chronic kidney disease with stage 1 through stage 4 chronic kidney disease, or unspecified chronic kidney disease: Secondary | ICD-10-CM | POA: Diagnosis present

## 2014-03-06 DIAGNOSIS — I2510 Atherosclerotic heart disease of native coronary artery without angina pectoris: Secondary | ICD-10-CM | POA: Diagnosis present

## 2014-03-06 DIAGNOSIS — N184 Chronic kidney disease, stage 4 (severe): Secondary | ICD-10-CM | POA: Diagnosis present

## 2014-03-06 DIAGNOSIS — F330 Major depressive disorder, recurrent, mild: Secondary | ICD-10-CM

## 2014-03-06 DIAGNOSIS — I252 Old myocardial infarction: Secondary | ICD-10-CM | POA: Diagnosis not present

## 2014-03-06 DIAGNOSIS — I10 Essential (primary) hypertension: Secondary | ICD-10-CM

## 2014-03-06 DIAGNOSIS — E1165 Type 2 diabetes mellitus with hyperglycemia: Secondary | ICD-10-CM

## 2014-03-06 DIAGNOSIS — E1143 Type 2 diabetes mellitus with diabetic autonomic (poly)neuropathy: Secondary | ICD-10-CM

## 2014-03-06 DIAGNOSIS — D631 Anemia in chronic kidney disease: Secondary | ICD-10-CM

## 2014-03-06 DIAGNOSIS — E118 Type 2 diabetes mellitus with unspecified complications: Secondary | ICD-10-CM

## 2014-03-06 DIAGNOSIS — G9340 Encephalopathy, unspecified: Secondary | ICD-10-CM

## 2014-03-06 DIAGNOSIS — K7682 Hepatic encephalopathy: Secondary | ICD-10-CM | POA: Diagnosis present

## 2014-03-06 LAB — URINE MICROSCOPIC-ADD ON

## 2014-03-06 LAB — COMPREHENSIVE METABOLIC PANEL
ALBUMIN: 1.8 g/dL — AB (ref 3.5–5.2)
ALK PHOS: 492 U/L — AB (ref 39–117)
ALT: 20 U/L (ref 0–35)
AST: 47 U/L — ABNORMAL HIGH (ref 0–37)
Anion gap: 19 — ABNORMAL HIGH (ref 5–15)
BILIRUBIN TOTAL: 2.1 mg/dL — AB (ref 0.3–1.2)
BUN: 34 mg/dL — ABNORMAL HIGH (ref 6–23)
CHLORIDE: 109 meq/L (ref 96–112)
CO2: 15 meq/L — AB (ref 19–32)
Calcium: 7.5 mg/dL — ABNORMAL LOW (ref 8.4–10.5)
Creatinine, Ser: 2.98 mg/dL — ABNORMAL HIGH (ref 0.50–1.10)
GFR calc non Af Amer: 17 mL/min — ABNORMAL LOW (ref >90)
GFR, EST AFRICAN AMERICAN: 19 mL/min — AB (ref >90)
Glucose, Bld: 217 mg/dL — ABNORMAL HIGH (ref 70–99)
POTASSIUM: 3.8 meq/L (ref 3.7–5.3)
Sodium: 143 mEq/L (ref 137–147)
Total Protein: 7.3 g/dL (ref 6.0–8.3)

## 2014-03-06 LAB — CBC WITH DIFFERENTIAL/PLATELET
BASOS ABS: 0 10*3/uL (ref 0.0–0.1)
Basophils Relative: 0 % (ref 0–1)
Eosinophils Absolute: 0.1 10*3/uL (ref 0.0–0.7)
Eosinophils Relative: 2 % (ref 0–5)
HCT: 25.5 % — ABNORMAL LOW (ref 36.0–46.0)
Hemoglobin: 8 g/dL — ABNORMAL LOW (ref 12.0–15.0)
LYMPHS PCT: 26 % (ref 12–46)
Lymphs Abs: 1.1 10*3/uL (ref 0.7–4.0)
MCH: 32.1 pg (ref 26.0–34.0)
MCHC: 31.4 g/dL (ref 30.0–36.0)
MCV: 102.4 fL — ABNORMAL HIGH (ref 78.0–100.0)
Monocytes Absolute: 0.5 10*3/uL (ref 0.1–1.0)
Monocytes Relative: 13 % — ABNORMAL HIGH (ref 3–12)
NEUTROS ABS: 2.3 10*3/uL (ref 1.7–7.7)
NEUTROS PCT: 59 % (ref 43–77)
PLATELETS: 162 10*3/uL (ref 150–400)
RBC: 2.49 MIL/uL — ABNORMAL LOW (ref 3.87–5.11)
RDW: 16.9 % — AB (ref 11.5–15.5)
WBC: 4 10*3/uL (ref 4.0–10.5)

## 2014-03-06 LAB — URINALYSIS, ROUTINE W REFLEX MICROSCOPIC
GLUCOSE, UA: 250 mg/dL — AB
Hgb urine dipstick: NEGATIVE
KETONES UR: NEGATIVE mg/dL
Leukocytes, UA: NEGATIVE
Nitrite: NEGATIVE
Specific Gravity, Urine: 1.013 (ref 1.005–1.030)
Urobilinogen, UA: 0.2 mg/dL (ref 0.0–1.0)
pH: 6 (ref 5.0–8.0)

## 2014-03-06 LAB — I-STAT VENOUS BLOOD GAS, ED
ACID-BASE DEFICIT: 5 mmol/L — AB (ref 0.0–2.0)
BICARBONATE: 18.3 meq/L — AB (ref 20.0–24.0)
O2 Saturation: 99 %
PO2 VEN: 113 mmHg — AB (ref 30.0–45.0)
TCO2: 19 mmol/L (ref 0–100)
pCO2, Ven: 28.3 mmHg — ABNORMAL LOW (ref 45.0–50.0)
pH, Ven: 7.42 — ABNORMAL HIGH (ref 7.250–7.300)

## 2014-03-06 LAB — I-STAT CG4 LACTIC ACID, ED: LACTIC ACID, VENOUS: 1.18 mmol/L (ref 0.5–2.2)

## 2014-03-06 LAB — KETONES, QUALITATIVE: Acetone, Bld: NEGATIVE

## 2014-03-06 LAB — TROPONIN I: Troponin I: <0.3 ng/mL (ref <0.30)

## 2014-03-06 LAB — AMMONIA: Ammonia: 82 umol/L — ABNORMAL HIGH (ref 11–60)

## 2014-03-06 MED ORDER — HEPARIN SODIUM (PORCINE) 5000 UNIT/ML IJ SOLN
5000.0000 [IU] | Freq: Three times a day (TID) | INTRAMUSCULAR | Status: DC
  Administered 2014-03-06: 5000 [IU] via SUBCUTANEOUS
  Filled 2014-03-06: qty 1

## 2014-03-06 MED ORDER — HYDRALAZINE HCL 50 MG PO TABS
100.0000 mg | ORAL_TABLET | Freq: Three times a day (TID) | ORAL | Status: DC
  Administered 2014-03-06: 100 mg via ORAL
  Filled 2014-03-06 (×4): qty 2

## 2014-03-06 MED ORDER — ACYCLOVIR 800 MG PO TABS
800.0000 mg | ORAL_TABLET | Freq: Every day | ORAL | Status: DC
  Administered 2014-03-06: 800 mg via ORAL
  Filled 2014-03-06: qty 1

## 2014-03-06 MED ORDER — LEVETIRACETAM 750 MG PO TABS
1500.0000 mg | ORAL_TABLET | Freq: Two times a day (BID) | ORAL | Status: DC
  Administered 2014-03-06: 1500 mg via ORAL
  Filled 2014-03-06 (×2): qty 2

## 2014-03-06 MED ORDER — SODIUM CHLORIDE 0.9 % IV SOLN
Freq: Once | INTRAVENOUS | Status: AC
  Administered 2014-03-06: 05:00:00 via INTRAVENOUS

## 2014-03-06 MED ORDER — LACOSAMIDE 50 MG PO TABS
150.0000 mg | ORAL_TABLET | Freq: Two times a day (BID) | ORAL | Status: DC
  Administered 2014-03-06: 150 mg via ORAL
  Filled 2014-03-06: qty 3

## 2014-03-06 MED ORDER — DARUNAVIR ETHANOLATE 800 MG PO TABS
800.0000 mg | ORAL_TABLET | Freq: Every day | ORAL | Status: DC
  Administered 2014-03-06: 800 mg via ORAL
  Filled 2014-03-06 (×2): qty 1

## 2014-03-06 MED ORDER — FUROSEMIDE 20 MG PO TABS
80.0000 mg | ORAL_TABLET | Freq: Two times a day (BID) | ORAL | Status: DC
  Filled 2014-03-06: qty 4

## 2014-03-06 MED ORDER — LACTULOSE 10 GM/15ML PO SOLN
20.0000 g | Freq: Three times a day (TID) | ORAL | Status: DC
  Administered 2014-03-06 (×2): 20 g via ORAL
  Filled 2014-03-06 (×3): qty 30

## 2014-03-06 MED ORDER — SODIUM BICARBONATE 650 MG PO TABS
650.0000 mg | ORAL_TABLET | Freq: Two times a day (BID) | ORAL | Status: DC
  Administered 2014-03-06: 650 mg via ORAL
  Filled 2014-03-06 (×2): qty 1

## 2014-03-06 MED ORDER — CLONIDINE HCL 0.3 MG/24HR TD PTWK
0.3000 mg | MEDICATED_PATCH | TRANSDERMAL | Status: DC
  Administered 2014-03-06: 0.3 mg via TRANSDERMAL
  Filled 2014-03-06: qty 1

## 2014-03-06 MED ORDER — ASPIRIN EC 81 MG PO TBEC
81.0000 mg | DELAYED_RELEASE_TABLET | Freq: Every day | ORAL | Status: DC
  Administered 2014-03-06: 81 mg via ORAL
  Filled 2014-03-06: qty 1

## 2014-03-06 MED ORDER — SODIUM CHLORIDE 0.9 % IJ SOLN: 3.0000 mL | INTRAMUSCULAR | Status: DC | PRN

## 2014-03-06 MED ORDER — HYDROXYZINE HCL 10 MG PO TABS
10.0000 mg | ORAL_TABLET | Freq: Three times a day (TID) | ORAL | Status: DC | PRN
  Administered 2014-03-06: 10 mg via ORAL
  Filled 2014-03-06: qty 1

## 2014-03-06 MED ORDER — LABETALOL HCL 300 MG PO TABS
800.0000 mg | ORAL_TABLET | Freq: Two times a day (BID) | ORAL | Status: DC
  Administered 2014-03-06: 800 mg via ORAL
  Filled 2014-03-06 (×2): qty 1

## 2014-03-06 MED ORDER — ACETAMINOPHEN 325 MG PO TABS
650.0000 mg | ORAL_TABLET | Freq: Four times a day (QID) | ORAL | Status: DC | PRN
  Administered 2014-03-06: 650 mg via ORAL
  Filled 2014-03-06: qty 2

## 2014-03-06 MED ORDER — HYDROCERIN EX CREA
1.0000 "application " | TOPICAL_CREAM | Freq: Two times a day (BID) | CUTANEOUS | Status: DC
  Administered 2014-03-06: 1 via TOPICAL
  Filled 2014-03-06: qty 113

## 2014-03-06 MED ORDER — SODIUM CHLORIDE 0.9 % IV BOLUS (SEPSIS): 1000.0000 mL | Freq: Once | INTRAVENOUS | Status: DC

## 2014-03-06 MED ORDER — PROMETHAZINE HCL 12.5 MG PO TABS
12.5000 mg | ORAL_TABLET | Freq: Four times a day (QID) | ORAL | Status: DC | PRN
  Filled 2014-03-06: qty 1

## 2014-03-06 MED ORDER — ABACAVIR SULFATE 300 MG PO TABS
600.0000 mg | ORAL_TABLET | Freq: Every day | ORAL | Status: DC
  Administered 2014-03-06: 600 mg via ORAL
  Filled 2014-03-06: qty 2

## 2014-03-06 MED ORDER — SODIUM CHLORIDE 0.9 % IV SOLN: 250.0000 mL | INTRAVENOUS | Status: DC | PRN

## 2014-03-06 MED ORDER — LAMIVUDINE 10 MG/ML PO SOLN
150.0000 mg | Freq: Every day | ORAL | Status: DC
  Administered 2014-03-06: 150 mg via ORAL
  Filled 2014-03-06: qty 15

## 2014-03-06 MED ORDER — DOXAZOSIN MESYLATE 4 MG PO TABS
4.0000 mg | ORAL_TABLET | Freq: Every day | ORAL | Status: DC
  Filled 2014-03-06: qty 1

## 2014-03-06 MED ORDER — PANTOPRAZOLE SODIUM 40 MG PO TBEC
40.0000 mg | DELAYED_RELEASE_TABLET | Freq: Every day | ORAL | Status: DC
  Administered 2014-03-06: 40 mg via ORAL
  Filled 2014-03-06: qty 1

## 2014-03-06 MED ORDER — RITONAVIR 100 MG PO CAPS
100.0000 mg | ORAL_CAPSULE | Freq: Every day | ORAL | Status: DC
  Administered 2014-03-06: 100 mg via ORAL
  Filled 2014-03-06 (×2): qty 1

## 2014-03-06 MED ORDER — SODIUM CHLORIDE 0.9 % IJ SOLN: 3.0000 mL | Freq: Two times a day (BID) | INTRAMUSCULAR | Status: DC

## 2014-03-06 MED ORDER — CLONIDINE HCL 0.3 MG/24HR TD PTWK: 0.3000 mg | MEDICATED_PATCH | Freq: Every day | TRANSDERMAL | Status: AC

## 2014-03-06 NOTE — ED Notes (Signed)
S 

## 2014-03-06 NOTE — ED Notes (Signed)
Presents with increased respiratory difficulty over one week, headache and hot to touch. Pt is HIV positive and recently d/c from rehab for MRSA. Alert and answers questions appropriately. 98% RA and 100% on 2L.

## 2014-03-06 NOTE — ED Notes (Signed)
Unable to establish IV at this time on pt, four RNs have made attempts. Also made attempts at ultrasound IVs with no success.

## 2014-03-06 NOTE — H&P (Signed)
Date: 03/06/2014               Patient Name:  Michelle [name] MRN: [id number]  DOB: 1960-07-22 Age / Sex: 53 y.o., female   PCP: Clinton Gallant, MD         Medical Service: Internal Medicine Teaching Service         Attending Physician: Dr. Annia Belt, MD    First Contact: Dr. Marvel Plan Pager: [phone]  Second Contact: Dr. [place] Pager: [phone]       After Hours (After 5p/  First Contact Pager: [phone]  weekends / holidays): Second Contact Pager: [phone]   Chief Complaint: fever, weakness  History of Present Illness: Ms. Keinath is a 53 yo female with PMHx of Diastolic CHF, ESLD, HIV, CKD stage IV, Type II DM, Anemia of Chronic Disease, Seizures, HTN, HLD, GERD, CAD s/p MI, CVA who presents from home via EMS for increased weakness and shortness of breath. Patient was recently admitted to [facility] in September and treated for HCAP, hypertensive urgency and C. Diff infection. Patient was discharge to SNF with Flagyl po for 14 more days and Vancomycin and Ceftazidine for 8 more days. Patient was recently seen in clinic on 10/1 and her pneumonia symptoms were improved, breathing fine.   On interview, patient is difficult to obtain a history from due to somnolence and confusion. She is able to wake up and become alert enough to speak with [place]. She complains of hunger, abdominal pain and productive cough with white yellow phlegm. She also complains of weakness and states she wobbles to the bathroom and needs a lot of assistance with completing her ADLs. Upon speaking with her caretaker, her sister Katherine Roan, she states that Ms. Koskela has been more short of breath, with dyspnea on exertion, weakness that does not seem to be improving. Due to weakness, patient has not been able to complete any of her ADLs, she must call from bed for help, she also cannot get up out of bed to use the bathroom and has been incontinent of urine and frequently urinates and defecates on herself. Patient stopped taking her lactulose  3 days ago and patient has also been more confused over the past few days.   Meds: Current Facility-Administered Medications  Medication Dose Route Frequency Provider Last Rate Last Dose  . 0.9 %  sodium chloride infusion  250 mL Intravenous PRN Blain Pais, MD      . abacavir (ZIAGEN) tablet 600 mg  600 mg Oral Daily Ankit Nanavati, MD   600 mg at 03/06/14 1210  . acetaminophen (TYLENOL) tablet 650 mg  650 mg Oral Q6H PRN Blain Pais, MD   650 mg at 03/06/14 1053  . acyclovir (ZOVIRAX) tablet 800 mg  800 mg Oral Daily Ankit Nanavati, MD   800 mg at 03/06/14 1213  . aspirin EC tablet 81 mg  81 mg Oral Daily Blain Pais, MD   81 mg at 03/06/14 1209  . cloNIDine (CATAPRES - Dosed in mg/24 hr) patch 0.3 mg  0.3 mg Transdermal Weekly Blain Pais, MD   0.3 mg at 03/06/14 1217  . Darunavir Ethanolate (PREZISTA) tablet 800 mg  800 mg Oral Q breakfast Ankit Nanavati, MD   800 mg at 03/06/14 1212  . doxazosin (CARDURA) tablet 4 mg  4 mg Oral QHS Ankit Nanavati, MD      . furosemide (LASIX) tablet 80 mg  80 mg Oral BID Varney Biles, MD      .  heparin injection 5,000 Units  5,000 Units Subcutaneous 3 times per day Blain Pais, MD      . hydrALAZINE (APRESOLINE) tablet 100 mg  100 mg Oral 3 times per day Varney Biles, MD   100 mg at 03/06/14 1214  . hydrocerin (EUCERIN) cream 1 application  1 application Topical BID Blain Pais, MD   1 application at 46/65/99 1316  . hydrOXYzine (ATARAX/VISTARIL) tablet 10 mg  10 mg Oral TID PRN Varney Biles, MD   10 mg at 03/06/14 1215  . labetalol (NORMODYNE) tablet 800 mg  800 mg Oral BID Ankit Nanavati, MD   800 mg at 03/06/14 1311  . lacosamide (VIMPAT) tablet 150 mg  150 mg Oral BID Blain Pais, MD   150 mg at 03/06/14 1310  . lactulose (CHRONULAC) 10 GM/15ML solution 20 g  20 g Oral TID Varney Biles, MD   20 g at 03/06/14 1317  . lamiVUDine (EPIVIR) 10 MG/ML solution 150 mg  150 mg Oral Daily Varney Biles, MD   150 mg at 03/06/14 1216  . levETIRAcetam (KEPPRA) tablet 1,500 mg  1,500 mg Oral BID Varney Biles, MD   1,500 mg at 03/06/14 1212  . pantoprazole (PROTONIX) EC tablet 40 mg  40 mg Oral Daily Blain Pais, MD   40 mg at 03/06/14 1310  . promethazine (PHENERGAN) tablet 12.5 mg  12.5 mg Oral Q6H PRN Blain Pais, MD      . ritonavir (NORVIR) capsule 100 mg  100 mg Oral Q breakfast Ankit Nanavati, MD   100 mg at 03/06/14 1210  . sodium bicarbonate tablet 650 mg  650 mg Oral BID Blain Pais, MD   650 mg at 03/06/14 1211  . sodium chloride 0.9 % injection 3 mL  3 mL Intravenous Q12H Blain Pais, MD      . sodium chloride 0.9 % injection 3 mL  3 mL Intravenous PRN Blain Pais, MD       Current Outpatient Prescriptions  Medication Sig Dispense Refill  . abacavir (ZIAGEN) 300 MG tablet Take 600 mg by mouth daily.       [name] acetaminophen (TYLENOL) 325 MG tablet Take 650 mg by mouth every 6 (six) hours as needed (Fever).      [name] acyclovir (ZOVIRAX) 800 MG tablet Take 800 mg by mouth daily.       [name] aspirin EC 81 MG EC tablet Take 1 tablet (81 mg total) by mouth daily.      . cloNIDine (CATAPRES - DOSED IN MG/24 HR) 0.3 mg/24hr patch Place 1 patch (0.3 mg total) onto the skin once a week.  4 patch  12  . darbepoetin (ARANESP) 200 MCG/0.4ML SOLN injection Inject 0.4 mLs (200 mcg total) into the skin every 14 (fourteen) days.  1.68 mL    . Darunavir Ethanolate (PREZISTA) 800 MG tablet Take 800 mg by mouth daily with breakfast.      . doxazosin (CARDURA) 4 MG tablet Take 1 tablet (4 mg total) by mouth at bedtime.      . furosemide (LASIX) 80 MG tablet Take 1 tablet (80 mg total) by mouth 2 (two) times daily.      . hydrALAZINE (APRESOLINE) 100 MG tablet Take 1 tablet (100 mg total) by mouth every 8 (eight) hours.  90 tablet  3  . hydrocerin (EUCERIN) CREA Apply 1 application topically 2 (two) times daily.  113 g  0  . hydrOXYzine (ATARAX/VISTARIL) 10 MG  tablet Take 1 tablet (10 mg total) by mouth 3 (three) times daily as needed for itching.  30 tablet  0  . insulin aspart (NOVOLOG) 100 UNIT/ML injection Inject 10-25 Units into the skin 3 (three) times daily before meals. Based on sliding scale      . labetalol (NORMODYNE) 200 MG tablet Take 4 tablets (800 mg total) by mouth 2 (two) times daily.      [name] lacosamide 150 MG TABS Take 1 tablet (150 mg total) by mouth 2 (two) times daily.  60 tablet    . lactulose (CHRONULAC) 10 GM/15ML solution Take 30 mLs (20 g total) by mouth 3 (three) times daily.  240 mL  0  . lamiVUDine (EPIVIR) 10 MG/ML solution Take 15 mLs (150 mg total) by mouth daily.  240 mL  12  . levETIRAcetam (KEPPRA) 500 MG tablet Take 1,500 mg by mouth 2 (two) times daily.      [name] levofloxacin (LEVAQUIN) 750 MG tablet Take 750 mg by mouth every other day. For 14 days      . LORazepam (ATIVAN) 1 MG tablet Take 1 mg by mouth every 8 (eight) hours as needed for anxiety.      [name] omeprazole (PRILOSEC) 20 MG capsule Take 20 mg by mouth daily before breakfast.      . potassium chloride (K-DUR,KLOR-CON) 10 MEQ tablet Take 10 mEq by mouth daily.      . ritonavir (NORVIR) 100 MG capsule Take 100 mg by mouth daily with breakfast.      . sodium bicarbonate 650 MG tablet Take 650 mg by mouth 2 (two) times daily.        Allergies: Allergies as of 03/06/2014 - Review Complete 03/06/2014  Allergen Reaction Noted  . Ceftriaxone Other (See Comments) 06/01/2013  . Morphine and related Hives, Itching, and Rash 02/15/2013  . Norvasc [amlodipine besylate] Hives, Itching, and Rash 03/30/2013   Past Medical History  Diagnosis Date  . Meningitis   . HIV (human immunodeficiency virus infection) 1981  . Hypertension   . Gout   . Muscle spasms of head and/or neck   . Hypertensive encephalopathy ~ 05/2013    hospitalaized/notes 06/18/2013  . Anxiety     [name] 06/18/2013  . History of syphilis   . High cholesterol   . Cirrhosis     hepatitis C   . GERD  (gastroesophageal reflux disease)   . Psychosis   . Chronic venous stasis dermatitis of both lower extremities   . CRD (chronic renal disease), stage IV   . Nephrotic syndrome   . CHF (congestive heart failure)     "one time" (01/22/2014)  . Myocardial infarction 2015    "ealy part of this year"  . End stage liver disease   . AIHA (autoimmune hemolytic anemia)     [name] 06/18/2013  . Anemia   . HCAP (healthcare-associated pneumonia) 12/2013  . Exertional shortness of breath   . Type II diabetes mellitus   . History of blood transfusion 12/2013 X ?    "I've had a few recently" (01/22/2014)  . Hepatitis C     pt denies this hx on 01/22/2014  . Migraine     "all the time" (01/22/2014)  . Seizures     "last sz was 10/2013" (01/22/2014)  . Stroke     "I've had a few; I've recovered from all of them; had one earlier this year too" (01/22/2014)  . Arthritis     "all over; comes and  goes" (01/22/2014)  . Chronic back pain     "on and off" (01/22/2014)   Past Surgical History  Procedure Laterality Date  . Hip pinning Right 1980's  . Av fistula placement Right 07/24/2013    Procedure: RIGHT arm exploration of antecubital space;  Surgeon: Elam Dutch, MD;  Location: [place];  Service: Vascular;  Laterality: Right;  . Portacath placement  11/27/2013  . Colonoscopy N/A 12/19/2013    Procedure: COLONOSCOPY;  Surgeon: Gatha Mayer, MD;  Location: [place];  Service: Endoscopy;  Laterality: N/A;  . Esophagogastroduodenoscopy N/A 12/19/2013    Procedure: ESOPHAGOGASTRODUODENOSCOPY (EGD);  Surgeon: Gatha Mayer, MD;  Location[facility] ENDOSCOPY;  Service: Endoscopy;  Laterality: N/A;  . Tee without cardioversion N/A 01/10/2014    Procedure: TRANSESOPHAGEAL ECHOCARDIOGRAM (TEE);  Surgeon: Sanda Klein, MD;  Location: [facility] ENDOSCOPY;  Service: Cardiovascular;  Laterality: N/A;   Family History  Problem Relation Age of Onset  . Cancer - Colon Mother   . Cancer Father   . Hypertension Father   .  Diabetes    . Diabetes Sister    History   Social History  . Marital Status: Single    Spouse Name: N/A    Number of Children: 4  . Years of Education: 11th   Occupational History  . Not on file.   Social History Main Topics  . Smoking status: Former Smoker    Types: Cigarettes    Quit date: 06/19/2010  . Smokeless tobacco: Never Used  . Alcohol Use: No  . Drug Use: Yes    Special: Cocaine, Marijuana, "Crack" cocaine     Comment: past history of alcohol, cocaine and IV drug use; "stopped drugs in 2012"  . Sexual Activity: Not Currently    Partners: Male   Other Topics Concern  . Not on file   Social History Narrative   Patient lives at home with sister.    Patient is unemployed.    Patient is single.    Patient has 2 alive and 2 deceased.    Patient has 11th grade education.           Review of Systems: General: Admits to fever, Denies chills, fatigue, change in appetite and diaphoresis.  Respiratory: Admits to SOB, productive cough, and DOE, Denies chest tightness, and wheezing.   Cardiovascular: Denies chest pain and palpitations.  Gastrointestinal: Admits to abdominal pain. Denies nausea, vomiting, diarrhea, constipation, blood in stool and abdominal distention.  Genitourinary: Denies dysuria, urgency, frequency, hematuria, suprapubic pain and flank pain. Endocrine: Denies hot or cold intolerance, polyuria, and polydipsia. Musculoskeletal: Denies myalgias, back pain, joint swelling, arthralgias and gait problem.  Skin: Denies pallor, rash and wounds.  Neurological: Admits to weakness. Denies dizziness, headaches, lightheadedness, numbness,seizures, and syncope, Psychiatric/Behavioral: Denies mood changes, confusion, nervousness, sleep disturbance and agitation.  Physical Exam: Filed Vitals:   03/06/14 0800 03/06/14 0810 03/06/14 0832 03/06/14 1029  BP: 193/108 182/87 172/80 180/83  Pulse: 81 80 78 80  Temp:  98.2 F (36.8 C)  98.1 F (36.7 C)  TempSrc:   Oral  Oral  Resp: 22 20 28 26   SpO2: 100% 100% 97% 97%   General: Vital signs reviewed.  Patient is somnolent and confused, in no acute distress and cooperative with exam.   Cardiovascular: RRR, 2/6 diastolic murmur.  Pulmonary/Chest: Diffuse coarse rhonchi, no wheezing, rhonchi or rales.  Abdominal: Firm and distended, tender to palpation, no fluid wave appreciated, BS +. Musculoskeletal: No joint deformities, erythema, or stiffness, ROM  full and nontender. Extremities: Trace pitting edema in her lower extremities bilaterally,  pulses symmetric and intact bilaterally. No cyanosis or clubbing. Neurological: A&O x2, Strength is 4/5 in upper extremities bilaterally, 1/5 in lower extremities bilaterally. Skin: Warm, dry and intact. No rashes or erythema. Psychiatric: Normal mood and affect. speech and behavior is abnormal. Cognition and memory are abnormal.   Lab results: Basic Metabolic Panel:  Recent Labs  03/03/14 1555 03/06/14 0058  NA 141 143  K 3.9 3.8  CL 113* 109  CO2 17* 15*  GLUCOSE 228* 217*  BUN 24* 34*  CREATININE 2.49* 2.98*  CALCIUM 7.3* 7.5*   Liver Function Tests:  Recent Labs  03/06/14 0058  AST 47*  ALT 20  ALKPHOS 492*  BILITOT 2.1*  PROT 7.3  ALBUMIN 1.8*   CBC:  Recent Labs  03/03/14 1555 03/06/14 0154  WBC 4.6 4.0  NEUTROABS  --  2.3  HGB 7.9* 8.0*  HCT 24.9* 25.5*  MCV 100.8* 102.4*  PLT 198 162   Cardiac Enzymes:  Recent Labs  03/06/14 0555  TROPONINI <0.30   CBG:  Recent Labs  03/03/14 1602  GLUCAP 228*     Urinalysis:  Recent Labs  03/06/14 0129  COLORURINE YELLOW  LABSPEC 1.013  PHURINE 6.0  GLUCOSEU 250*  HGBUR NEGATIVE  BILIRUBINUR SMALL*  KETONESUR NEGATIVE  PROTEINUR >300*  UROBILINOGEN 0.2  NITRITE NEGATIVE  LEUKOCYTESUR NEGATIVE    Imaging results:  US Abdomen Complete  03/06/2014   CLINICAL DATA:  Abdominal distention with suspected ascites; history of HIV, cirrhosis, chronic renal disease with  nephrotic syndrome, CHF  EXAM: LIMITED ABDOMEN ULTRASOUND FOR ASCITES  TECHNIQUE: Limited ultrasound survey for ascites was performed in all four abdominal quadrants.  COMPARISON:  Abdominal ultrasound of February 03, 2014  FINDINGS: No ascites is demonstrated. There is gallbladder wall thickening and small amount of pericholecystic fluid. The observed portions of the liver lip exhibit no focal abnormalities.  IMPRESSION: No ascites is demonstrated. There is thickening of the wall of the gallbladder which may reflect acute or chronic inflammation.   Electronically Signed   By: David  Martinique   On: 03/06/2014 12:00   Dg Chest Port 1 View  03/06/2014   CLINICAL DATA:  Acute onset of fever and shortness of breath. Initial encounter.  EXAM: PORTABLE CHEST - 1 VIEW  COMPARISON:  Chest radiograph performed 02/12/2014  FINDINGS: The lungs are well-aerated. Right basilar airspace opacity raises concern for pneumonia, given the patient's symptoms. A small right pleural effusion is suspected. There is persistent elevation of the right hemidiaphragm. Minimal left basilar atelectasis is noted. No pneumothorax is seen.  The cardiomediastinal silhouette is mildly enlarged. No acute osseous abnormalities are seen.  IMPRESSION: 1. Right basilar airspace opacity raises concern for pneumonia, given the patient's symptoms. Suspect small right pleural effusion. Persistent elevation of the right hemidiaphragm. 2. Mild cardiomegaly.  Minimal left basilar atelectasis noted.   Electronically Signed   By: Garald Balding M.D.   On: 03/06/2014 01:00    Other results: EKG: normal EKG, normal sinus rhythm, unchanged from previous tracings.  Assessment & Plan by Problem:  Lower Extremity Weakness Due to Deconditioning: Patient presents with complaint of weakness confirmed by sister. Patient has had trouble performing her ADLs and walking. On exam she has 1/5 lower extremity strength bilaterally and 4/5 in upper extremities. Patient is  likely deconditioned from long hospital stay. She was recently discharged from SNF where she was receiving rehab. Patient had PT/OT scheduled  to to see her at home, but they have not been yet. Sister needs considerable help taking care of patient. We have contacted social work who will be able to get the patient back into a SNF for continued rehab today or tomorrow if medically stable. -PT/OT -Likely discharge to SNF today  Hepatic Encephalopathy: Patient presents with some confusion, altered mental status and elevated ammonia level. She is somnolent on exam, but easily arousable, and is AAOx2, not oriented to date or time. Patient was not taking her home Lactulose of 20 grams TID since she was having trouble getting up to use the bathroom secondary to weakness. Urinalysis was negative. Troponin negative x 1. Acetone level negative.  -Lactulose 20 grams TID  Abdominal Distention: Patient has mild diffuse tenderness on exam, normoactive bowel sounds, no apparent fluid wave. We preformed Abdominal US which showed no ascites. There is thickening of the wall of the gallbladder which may reflect acute or chronic inflammation and a small amount of pericholecystic fluid. Patient denies any abdominal pain after eating, denies nausea, vomiting. Patient's Alk phos is elevated at 492, but improved from August and September, 700 and 598, respectively. T. Bili 2.1, also improved from prior, 2.1 and 4.0. We do not have concern for acute cholecystitis. Patient has not taken lactulose in several days and distention may be secondary to not having bowel movement.  -Monitor  Resolving Pneumonia: Patient was recently discharged from [facility] in September for treatment for HCAP. Patient was given Vancomycin and Ceftazidine for 8 more days on discharge to complete the antibiotic regimen. Patient had a follow up appointment with [facility] clinic on 10/1 and was doing well at that time. On presentation to the ED, patient  continues to complain of shortness of breath and productive cough. On physical exam, the patient is afebrile, saturating 98% on room air and diffusely rhonchus on exam, but without wheezes or crackles. Patient has no leukocytosis, 4.0. CXR showed R basilar opacity with a small amount of right pleural effusion and right hemidiaphragm elevation with mild cardiomegaly. This CXR is similar, but improved from prior. Given that the patient just finished antibiotic treatment, is afebrile, saturating well on room air and is without leukocytosis, it is unlikely that the patient has a new pneumonia.  -CBC/BMET tomorrow morning -Flutter valve -Pulmonary Toiletry  Diastolic CHF: TEE on 4/58/09 showed EF 55-60%. Not fluid overloaded. Trace pitting edema in lower extremities bilaterally. No crackles on exam. -Lasix 80 mg BID -Clonidine 0.3 mg change weekly -Doxazosin 4 mg daily -Hydralazine 100 mg TID -Labetalol 800 mg BID -Daily Weights  HIV: CD4 240 and HIV RNA < 20 on 02/05/14. Continue home medications. -Abacavir 600 mg daily -Acyclovir 800 mg daily -Darunavir Ethanolate 800 mg daily -Lamivudine 150 mg daily -Ritonavir 100 mg daily  HTN: BP 155/77 on presentation to ED. We will continue her home medications and monitor. -Clonidine 0.3 mg change weekly -Doxazosin 4 mg daily -Hydralazine 100 mg TID -Labetalol 800 mg BID  GERD: No complaints of nausea or vomiting. Stable. -Phenergan 12.5 mg Q6H prn nausea -Protonix 40 mg daily  DVT/PE ppx: Heparin TID  Dispo: Disposition is deferred at this time, awaiting improvement of current medical problems. Anticipated discharge in approximately 1-2 day(s).   The patient does have a current PCP Clinton Gallant, MD) and does need an [facility] hospital follow-up appointment after discharge.  The patient does not have transportation limitations that hinder transportation to clinic appointments.  Signed: Osa Craver, DO PGY-1 Internal Medicine  Resident Pager #  [phone] 03/06/2014 2:11 PM

## 2014-03-06 NOTE — Discharge Summary (Signed)
Attending physician discharge note: I personally examined this patient on the day of discharge and I concur with the evaluation and discharge plan outlined by Dr. Jill Alexanders. 53 year old woman with multiple complicated medical problems on multiple medications. She was brought to the emergency department this morning by ambulance called by her sister and main care giver who is currently overwhelmed with the patient's nursing care. The patient is essentially immobile secondary to deconditioning and intermittently incontinent of urine and stool. Her sister is no longer able to provide the intensity of care needed to support the patient's activities of daily living. Please see history and physical from this morning for additional details of this patient's medical history. She'll be transferred to an extended care facility where she can get the appropriate nursing care.  Cephas Darby, MD, FACP  Hematology-Oncology/Internal Medicine

## 2014-03-06 NOTE — Clinical Social Work Note (Signed)
Clinical Social Worker met with patient this morning to offer support and discuss patient needs at discharge.  Patient states that she was living at [facility] and [place] and was discharged home 10/01 with her sister.  Patient and patient sister agree that patient is not able to manage at home and will need to return to SNF.  CSW pursued placement back at [place] as well as additional facilities per patient request.  Unfortunately patient was denied and will be returning to [facility].  CSW spoke with patient at bedside and she is in agreement with return.  CSW also notified patient sister, Lattie Haw who is in agreement and will meet patient at facility.  Patient understands that she must stay for 30 days in order to receive full Medicaid benefit - LOG requested by facility and provided to ensure patient stay.  CSW to call [facility] for transport.  Barbette Or, [place]

## 2014-03-06 NOTE — ED Notes (Addendum)
Per Brayton Caves from case management, patient has SNF placement. Admitting MD to discontinue admission and admitted to facility

## 2014-03-06 NOTE — Progress Notes (Signed)
Report received from [place], [place] for admission to [phone]

## 2014-03-06 NOTE — ED Provider Notes (Addendum)
CSN: [id number]     Arrival date & time 03/06/14  0012 History   First MD Initiated Contact with Patient 03/06/14 0241     Chief Complaint  Patient presents with  . Fever  . Weakness     (Consider location/radiation/quality/duration/timing/severity/associated sxs/prior Treatment) HPI Comments: LEVEL 5 CAVEAT FOR CONFUSION/ALTERD MENTAL STATUS.  53 y.o. F w/ PMH diastolic congestive heart failure, end-stage liver disease, HIV disease, stage IV chronic kidney disease, type 2 diabetes, anemia chronic disease, and seizures who presents to the ER with cc of dib. Pt is a poor historian.  She reports dib, with cough - producing yellow phlegm and subjective fevers. Pt has intermittent chest pain, currently chest pain free. Unable to tell me anything more about the chest pain. There is no nausea. Pt has been taking her meds as prescribed. Notes indicate admission in September, when she was treated for HCAP and Cdiff.  Patient is a 53 y.o. female presenting with fever and weakness. The history is provided by medical records.  Fever Associated symptoms: chest pain and nausea   Associated symptoms: no rash   Weakness Associated symptoms include chest pain and shortness of breath.    Past Medical History  Diagnosis Date  . Meningitis   . HIV (human immunodeficiency virus infection) 1981  . Hypertension   . Gout   . Muscle spasms of head and/or neck   . Hypertensive encephalopathy ~ 05/2013    hospitalaized/notes 06/18/2013  . Anxiety     [name] 06/18/2013  . History of syphilis   . High cholesterol   . Cirrhosis     hepatitis C   . GERD (gastroesophageal reflux disease)   . Psychosis   . Chronic venous stasis dermatitis of both lower extremities   . CRD (chronic renal disease), stage IV   . Nephrotic syndrome   . CHF (congestive heart failure)     "one time" (01/22/2014)  . Myocardial infarction 2015    "ealy part of this year"  . End stage liver disease   . AIHA (autoimmune  hemolytic anemia)     [name] 06/18/2013  . Anemia   . HCAP (healthcare-associated pneumonia) 12/2013  . Exertional shortness of breath   . Type II diabetes mellitus   . History of blood transfusion 12/2013 X ?    "I've had a few recently" (01/22/2014)  . Hepatitis C     pt denies this hx on 01/22/2014  . Migraine     "all the time" (01/22/2014)  . Seizures     "last sz was 10/2013" (01/22/2014)  . Stroke     "I've had a few; I've recovered from all of them; had one earlier this year too" (01/22/2014)  . Arthritis     "all over; comes and goes" (01/22/2014)  . Chronic back pain     "on and off" (01/22/2014)   Past Surgical History  Procedure Laterality Date  . Hip pinning Right 1980's  . Av fistula placement Right 07/24/2013    Procedure: RIGHT arm exploration of antecubital space;  Surgeon: Sherren Kerns, MD;  Location: [facility] OR;  Service: Vascular;  Laterality: Right;  . Portacath placement  11/27/2013  . Colonoscopy N/A 12/19/2013    Procedure: COLONOSCOPY;  Surgeon: Iva Boop, MD;  Location: [facility] ENDOSCOPY;  Service: Endoscopy;  Laterality: N/A;  . Esophagogastroduodenoscopy N/A 12/19/2013    Procedure: ESOPHAGOGASTRODUODENOSCOPY (EGD);  Surgeon: Iva Boop, MD;  Location: [facility] ENDOSCOPY;  Service: Endoscopy;  Laterality: N/A;  .  Tee without cardioversion N/A 01/10/2014    Procedure: TRANSESOPHAGEAL ECHOCARDIOGRAM (TEE);  Surgeon: Thurmon FairMihai Croitoru, MD;  Location[facility] ENDOSCOPY;  Service: Cardiovascular;  Laterality: N/A;   Family History  Problem Relation Age of Onset  . Cancer - Colon Mother   . Cancer Father   . Hypertension Father   . Diabetes    . Diabetes Sister    History  Substance Use Topics  . Smoking status: Former Smoker    Types: Cigarettes    Quit date: 06/19/2010  . Smokeless tobacco: Never Used  . Alcohol Use: No   OB History   Grav Para Term Preterm Abortions TAB SAB Ect Mult Living                 Review of Systems  Unable to perform ROS: Mental status  change  Constitutional: Positive for fever and fatigue.  Respiratory: Positive for shortness of breath.   Cardiovascular: Positive for chest pain.  Gastrointestinal: Positive for nausea.  Skin: Negative for rash.  Neurological: Positive for weakness.      Allergies  Ceftriaxone; Morphine and related; and Norvasc  Home Medications   Prior to Admission medications   Medication Sig Start Date End Date Taking? Authorizing Provider  abacavir (ZIAGEN) 300 MG tablet Take 600 mg by mouth daily.    Yes Historical Provider, MD  acetaminophen (TYLENOL) 325 MG tablet Take 650 mg by mouth every 6 (six) hours as needed (Fever). 01/15/14  Yes Ky BarbanSolianny D Kennerly, MD  acyclovir (ZOVIRAX) 800 MG tablet Take 800 mg by mouth daily.    Yes Historical Provider, MD  aspirin EC 81 MG EC tablet Take 1 tablet (81 mg total) by mouth daily. 02/07/14  Yes Griffin BasilJennifer Krall, MD  darbepoetin (ARANESP) 200 MCG/0.4ML SOLN injection Inject 0.4 mLs (200 mcg total) into the skin every 14 (fourteen) days. 01/24/14  Yes Ky BarbanSolianny D Kennerly, MD  Darunavir Ethanolate (PREZISTA) 800 MG tablet Take 800 mg by mouth daily with breakfast.   Yes Historical Provider, MD  doxazosin (CARDURA) 4 MG tablet Take 1 tablet (4 mg total) by mouth at bedtime. 02/07/14  Yes Griffin BasilJennifer Krall, MD  furosemide (LASIX) 80 MG tablet Take 1 tablet (80 mg total) by mouth 2 (two) times daily. 02/07/14  Yes Griffin BasilJennifer Krall, MD  hydrALAZINE (APRESOLINE) 100 MG tablet Take 1 tablet (100 mg total) by mouth every 8 (eight) hours. 01/24/14  Yes Ky BarbanSolianny D Kennerly, MD  hydrocerin (EUCERIN) CREA Apply 1 application topically 2 (two) times daily. 02/07/14  Yes Griffin BasilJennifer Krall, MD  hydrOXYzine (ATARAX/VISTARIL) 10 MG tablet Take 1 tablet (10 mg total) by mouth 3 (three) times daily as needed for itching. 01/15/14  Yes Ky BarbanSolianny D Kennerly, MD  insulin aspart (NOVOLOG) 100 UNIT/ML injection Inject 10-25 Units into the skin 3 (three) times daily before meals. Based on sliding  scale   Yes Historical Provider, MD  labetalol (NORMODYNE) 200 MG tablet Take 4 tablets (800 mg total) by mouth 2 (two) times daily. 02/07/14  Yes Griffin BasilJennifer Krall, MD  lacosamide 150 MG TABS Take 1 tablet (150 mg total) by mouth 2 (two) times daily. 01/15/14  Yes Ky BarbanSolianny D Kennerly, MD  lactulose (CHRONULAC) 10 GM/15ML solution Take 30 mLs (20 g total) by mouth 3 (three) times daily. 02/07/14  Yes Griffin BasilJennifer Krall, MD  lamiVUDine (EPIVIR) 10 MG/ML solution Take 15 mLs (150 mg total) by mouth daily. 02/07/14  Yes Griffin BasilJennifer Krall, MD  levETIRAcetam (KEPPRA) 500 MG tablet Take 1,500 mg by mouth 2 (two) times  daily.   Yes Historical Provider, MD  LORazepam (ATIVAN) 1 MG tablet Take 1 mg by mouth every 8 (eight) hours as needed for anxiety.   Yes Historical Provider, MD  omeprazole (PRILOSEC) 20 MG capsule Take 20 mg by mouth daily before breakfast.   Yes Historical Provider, MD  potassium chloride (K-DUR,KLOR-CON) 10 MEQ tablet Take 10 mEq by mouth daily.   Yes Historical Provider, MD  ritonavir (NORVIR) 100 MG capsule Take 100 mg by mouth daily with breakfast.   Yes Historical Provider, MD  sodium bicarbonate 650 MG tablet Take 650 mg by mouth 2 (two) times daily. 12/24/13  Yes Yolanda Manges, DO  cloNIDine (CATAPRES - DOSED IN MG/24 HR) 0.3 mg/24hr patch Place 1 patch (0.3 mg total) onto the skin daily. 03/06/14   Ky Barban, MD   BP 148/70  Pulse 84  Temp(Src) 98.4 F (36.9 C) (Oral)  Resp 16  SpO2 98% Physical Exam  Nursing note and vitals reviewed. Constitutional: She appears well-developed.  HENT:  Head: Atraumatic.  Dry mucosa  Eyes: Scleral icterus is present.  Neck: Neck supple.  Cardiovascular: Normal rate.   Murmur heard. Pulmonary/Chest: No respiratory distress. She has no wheezes.  Elevated respiratory rate, no accessory muscle use  Abdominal:  Diffuse tenderness    ED Course  Procedures (including critical care time) Labs Review Labs Reviewed  COMPREHENSIVE METABOLIC  PANEL - Abnormal; Notable for the following:    CO2 15 (*)    Glucose, Bld 217 (*)    BUN 34 (*)    Creatinine, Ser 2.98 (*)    Calcium 7.5 (*)    Albumin 1.8 (*)    AST 47 (*)    Alkaline Phosphatase 492 (*)    Total Bilirubin 2.1 (*)    GFR calc non Af Amer 17 (*)    GFR calc Af Amer 19 (*)    Anion gap 19 (*)    All other components within normal limits  URINALYSIS, ROUTINE W REFLEX MICROSCOPIC - Abnormal; Notable for the following:    Glucose, UA 250 (*)    Bilirubin Urine SMALL (*)    Protein, ur >300 (*)    All other components within normal limits  CBC WITH DIFFERENTIAL - Abnormal; Notable for the following:    RBC 2.49 (*)    Hemoglobin 8.0 (*)    HCT 25.5 (*)    MCV 102.4 (*)    RDW 16.9 (*)    Monocytes Relative 13 (*)    All other components within normal limits  URINE MICROSCOPIC-ADD ON - Abnormal; Notable for the following:    Casts HYALINE CASTS (*)    All other components within normal limits  AMMONIA - Abnormal; Notable for the following:    Ammonia 82 (*)    All other components within normal limits  I-STAT VENOUS BLOOD GAS, ED - Abnormal; Notable for the following:    pH, Ven 7.420 (*)    pCO2, Ven 28.3 (*)    pO2, Ven 113.0 (*)    Bicarbonate 18.3 (*)    Acid-base deficit 5.0 (*)    All other components within normal limits  CULTURE, BLOOD (ROUTINE X 2)  CULTURE, BLOOD (ROUTINE X 2)  URINE CULTURE  TROPONIN I  KETONES, QUALITATIVE  CBC WITH DIFFERENTIAL  I-STAT CG4 LACTIC ACID, ED    Imaging Review US Abdomen Complete  03/06/2014   CLINICAL DATA:  Abdominal distention with suspected ascites; history of HIV, cirrhosis, chronic renal disease with  nephrotic syndrome, CHF  EXAM: LIMITED ABDOMEN ULTRASOUND FOR ASCITES  TECHNIQUE: Limited ultrasound survey for ascites was performed in all four abdominal quadrants.  COMPARISON:  Abdominal ultrasound of February 03, 2014  FINDINGS: No ascites is demonstrated. There is gallbladder wall thickening and small  amount of pericholecystic fluid. The observed portions of the liver lip exhibit no focal abnormalities.  IMPRESSION: No ascites is demonstrated. There is thickening of the wall of the gallbladder which may reflect acute or chronic inflammation.   Electronically Signed   By: David  [place]   On: 03/06/2014 12:00     EKG Interpretation None      MDM   Final diagnoses:  Encephalopathy  Dyspnea    DDx includes: ICH/Stroke ACS Sepsis syndrome Infection - UTI/Pneumonia Electrolyte abnormality Drug overdose DKA Metabolic disorders including thyroid disorders, adrenal insufficiency Hepatic encephalopathy  Pt comes in with cc of dib. Pt is not providing me good history, despite multiple attempts to wake her up. She reports persistent cough with dib and subjective fevers, with Xrays raising suspicion for PNA. Pt is noted to be slightly tachypneic here, lung exam, on anterior auscultation is benign.   Unsure if there is a residual effect from recent PNA, or a new pna. Pt has mild tachypnea and cough, and she is immunocompromised. However, no fevers here, no leukocytosis. Dont feel that we need to necessarily commit her to HCAP tx right now, especially since ID team can see her soon later in the AM.  As far as the confusion is concerned. It appears clinically that she might have hepatic encephalopathy. Pt is arousabal, but really a poor historian - and can be placed at Grade II or Grade III encephalopathy. Ammonia is ordered.  Finally, pt has a gap of 19. With her albumin of 1.8, she could be in DKA. She most certainly has anion gap, that we need to look into.  IM has been consulted - as they know the patient well, and can help establish disposition and if discharge proper follow up.   Derwood Kaplan, MD 03/06/14 [id number]  Derwood Kaplan, MD 03/08/14 [id number]

## 2014-03-06 NOTE — Discharge Summary (Signed)
Name: Michelle [name] MRN: [id number] DOB: 1961-04-15 53 y.o. PCP: Clinton Gallant, MD  Date of Admission: 03/06/2014 12:21 AM Date of Discharge: 03/06/2014 Attending Physician: Annia Belt, MD  Discharge Diagnosis:  Principal Problem:   Generalized weakness Active Problems:   HIV disease   Hepatic encephalopathy   Dyspnea   Chronic diastolic CHF (congestive heart failure)   HTN (hypertension), benign   GERD without esophagitis  Discharge Medications:   Medication List    STOP taking these medications       levofloxacin 750 MG tablet  Commonly known as:  LEVAQUIN      TAKE these medications       abacavir 300 MG tablet  Commonly known as:  ZIAGEN  Take 600 mg by mouth daily.     acetaminophen 325 MG tablet  Commonly known as:  TYLENOL  Take 650 mg by mouth every 6 (six) hours as needed (Fever).     acyclovir 800 MG tablet  Commonly known as:  ZOVIRAX  Take 800 mg by mouth daily.     aspirin 81 MG EC tablet  Take 1 tablet (81 mg total) by mouth daily.     cloNIDine 0.3 mg/24hr patch  Commonly known as:  CATAPRES - Dosed in mg/24 hr  Place 1 patch (0.3 mg total) onto the skin daily.     darbepoetin 200 MCG/0.4ML Soln injection  Commonly known as:  ARANESP  Inject 0.4 mLs (200 mcg total) into the skin every 14 (fourteen) days.     doxazosin 4 MG tablet  Commonly known as:  CARDURA  Take 1 tablet (4 mg total) by mouth at bedtime.     furosemide 80 MG tablet  Commonly known as:  LASIX  Take 1 tablet (80 mg total) by mouth 2 (two) times daily.     hydrALAZINE 100 MG tablet  Commonly known as:  APRESOLINE  Take 1 tablet (100 mg total) by mouth every 8 (eight) hours.     hydrocerin Crea  Apply 1 application topically 2 (two) times daily.     hydrOXYzine 10 MG tablet  Commonly known as:  ATARAX/VISTARIL  Take 1 tablet (10 mg total) by mouth 3 (three) times daily as needed for itching.     insulin aspart 100 UNIT/ML injection  Commonly known as:   novoLOG  Inject 10-25 Units into the skin 3 (three) times daily before meals. Based on sliding scale     labetalol 200 MG tablet  Commonly known as:  NORMODYNE  Take 4 tablets (800 mg total) by mouth 2 (two) times daily.     Lacosamide 150 MG Tabs  Take 1 tablet (150 mg total) by mouth 2 (two) times daily.     lactulose 10 GM/15ML solution  Commonly known as:  CHRONULAC  Take 30 mLs (20 g total) by mouth 3 (three) times daily.     lamiVUDine 10 MG/ML solution  Commonly known as:  EPIVIR  Take 15 mLs (150 mg total) by mouth daily.     levETIRAcetam 500 MG tablet  Commonly known as:  KEPPRA  Take 1,500 mg by mouth 2 (two) times daily.     LORazepam 1 MG tablet  Commonly known as:  ATIVAN  Take 1 mg by mouth every 8 (eight) hours as needed for anxiety.     omeprazole 20 MG capsule  Commonly known as:  PRILOSEC  Take 20 mg by mouth daily before breakfast.     potassium chloride 10 MEQ  tablet  Commonly known as:  K-DUR,KLOR-CON  Take 10 mEq by mouth daily.     PREZISTA 800 MG tablet  Generic drug:  Darunavir Ethanolate  Take 800 mg by mouth daily with breakfast.     ritonavir 100 MG capsule  Commonly known as:  NORVIR  Take 100 mg by mouth daily with breakfast.     sodium bicarbonate 650 MG tablet  Take 650 mg by mouth 2 (two) times daily.        Disposition and follow-up:   Ms.Kenniyah ARASELY AKKERMAN was discharged from [facility] in Good condition.  At the hospital follow up visit please address:  1.  Weakness: Please address weakness and improvement of weakness in upper and lower extremities with PT/OT.   Please make sure patient is taking all of her medications in order to prevent confusion and hepatic encephalopathy.  If patient complains of RUQ pain, develops fever, or begins to develop abdominal pain after eating, please consider gallbladder disease as a differential. Please see below.  2.  Labs / imaging needed at time of follow-up: None  3.   Pending labs/ test needing follow-up: None  Follow-up Information   Follow up with Clinton Gallant, MD. Schedule an appointment as soon as possible for a visit in 2 weeks. (call and make an appointment for within 2 weeks)    Specialty:  Internal Medicine   Contact information:   [place] [address] [phone]      Procedures Performed:  Dg Chest 2 View  02/05/2014   CLINICAL DATA:  Shortness of breath, pain in lower abdomen  EXAM: CHEST  2 VIEW  COMPARISON:  02/03/2014  FINDINGS: Mild cardiac enlargement stable. Mild vascular congestion. Consolidation in the right lower lobe similar to prior study, with stable small effusion. Infiltrate in the left upper lobe, similar to slightly more prominent. Stable left perihilar infiltrate. Stable right central line.  IMPRESSION: Right lower lobe consolidation with small effusion, stable. Left perihilar and upper lobe infiltrate slightly worse.   Electronically Signed   By: Skipper Cliche M.D.   On: 02/05/2014 17:31   US Abdomen Complete  03/06/2014   CLINICAL DATA:  Abdominal distention with suspected ascites; history of HIV, cirrhosis, chronic renal disease with nephrotic syndrome, CHF  EXAM: LIMITED ABDOMEN ULTRASOUND FOR ASCITES  TECHNIQUE: Limited ultrasound survey for ascites was performed in all four abdominal quadrants.  COMPARISON:  Abdominal ultrasound of February 03, 2014  FINDINGS: No ascites is demonstrated. There is gallbladder wall thickening and small amount of pericholecystic fluid. The observed portions of the liver lip exhibit no focal abnormalities.  IMPRESSION: No ascites is demonstrated. There is thickening of the wall of the gallbladder which may reflect acute or chronic inflammation.   Electronically Signed   By: David  Martinique   On: 03/06/2014 12:00   Dg Chest Port 1 View  03/06/2014   CLINICAL DATA:  Acute onset of fever and shortness of breath. Initial encounter.  EXAM: PORTABLE CHEST - 1 VIEW  COMPARISON:  Chest  radiograph performed 02/12/2014  FINDINGS: The lungs are well-aerated. Right basilar airspace opacity raises concern for pneumonia, given the patient's symptoms. A small right pleural effusion is suspected. There is persistent elevation of the right hemidiaphragm. Minimal left basilar atelectasis is noted. No pneumothorax is seen.  The cardiomediastinal silhouette is mildly enlarged. No acute osseous abnormalities are seen.  IMPRESSION: 1. Right basilar airspace opacity raises concern for pneumonia, given the patient's symptoms. Suspect  small right pleural effusion. Persistent elevation of the right hemidiaphragm. 2. Mild cardiomegaly.  Minimal left basilar atelectasis noted.   Electronically Signed   By: Garald Balding M.D.   On: 03/06/2014 01:00   Admission HPI: Ms. Schnake is a 53 yo female with PMHx of Diastolic CHF, ESLD, HIV, CKD stage IV, Type II DM, Anemia of Chronic Disease, Seizures, HTN, HLD, GERD, CAD s/p MI, CVA who presents from home via EMS for increased weakness and shortness of breath. Patient was recently admitted to [facility] in September and treated for HCAP, hypertensive urgency and C. Diff infection. Patient was discharge to SNF with Flagyl po for 14 more days and Vancomycin and Ceftazidine for 8 more days. Patient was recently seen in clinic on 10/1 and her pneumonia symptoms were improved, breathing fine.  On interview, patient is difficult to obtain a history from due to somnolence and confusion. She is able to wake up and become alert enough to speak with [place]. She complains of hunger, abdominal pain and productive cough with white yellow phlegm. She also complains of weakness and states she wobbles to the bathroom and needs a lot of assistance with completing her ADLs. Upon speaking with her caretaker, her sister Katherine Roan, she states that Ms. Arteaga has been more short of breath, with dyspnea on exertion, weakness that does not seem to be improving. Due to weakness, patient has not been able to  complete any of her ADLs, she must call from bed for help, she also cannot get up out of bed to use the bathroom and has been incontinent of urine and frequently urinates and defecates on herself. Patient stopped taking her lactulose 3 days ago and patient has also been more confused over the past few days.   Hospital Course by problem list: Principal Problem:   Generalized weakness Active Problems:   HIV disease   Hepatic encephalopathy   Dyspnea   Chronic diastolic CHF (congestive heart failure)   HTN (hypertension), benign   GERD without esophagitis   Lower Extremity Weakness Due to Deconditioning: Patient presented with complaint of weakness confirmed by sister. Patient has had trouble performing her ADLs and walking. On exam she had 1/5 lower extremity strength bilaterally and 4/5 in upper extremities. Patient is likely deconditioned from long hospital stay. She was recently discharged from SNF where she was receiving rehab. Patient had PT/OT scheduled to to see her at home, but they have not been yet. Sister needs considerable help taking care of patient. The main issue is that patient needs rehabilitation in order to preform her ADLs and her caretakers cannot provide this care for her and she is at high risk of becoming increasingly deconditioned and developing new medical issues. We have contacted social work who will be able to get the patient back into a SNF today.  Hepatic Encephalopathy: Patient presented with some confusion, altered mental status and elevated ammonia level. She was somnolent on exam, but easily arousable, and is AAOx2, not oriented to date or time. Patient was not taking her home Lactulose of 20 grams TID since she was having trouble getting up to use the bathroom secondary to weakness. Urinalysis was negative. Troponin negative x 1. Acetone level negative. We resumed patient's Lactulose 20 grams TID and she returned back to baseline over the course of the  day.  Abdominal Distention: Patient had mild diffuse tenderness on exam, normoactive bowel sounds, no apparent fluid wave. We performed Abdominal US which showed no ascites. There was thickening of  the wall of the gallbladder which may reflect acute or chronic inflammation and a small amount of pericholecystic fluid. Patient denied any abdominal pain after eating, denies nausea, vomiting. Patient's Alk phos is elevated at 492, but improved from August and September, 700 and 598, respectively. T. Bili 2.1, also improved from prior, 2.1 and 4.0. We do not have concern for acute cholecystitis. Patient has not taken lactulose in several days and distention may be secondary to not having bowel movement. This should be continued to be monitored.  Resolving Pneumonia: Patient was recently discharged from [facility] in September for treatment for HCAP. Patient was given Vancomycin and Ceftazidine for 8 more days on discharge to complete the antibiotic regimen. Patient had a follow up appointment with [facility] clinic on 10/1 and was doing well at that time. On presentation to the ED, patient continues to complain of shortness of breath and productive cough. On physical exam, the patient was afebrile, saturating 98% on room air and diffusely rhonchus on exam, but without wheezes or crackles. Patient had no leukocytosis, 4.0. CXR showed R basilar opacity with a small amount of right pleural effusion and right hemidiaphragm elevation with mild cardiomegaly. This CXR was similar, but improved from prior. Given that the patient just finished antibiotic treatment, was afebrile, saturating well on room air and was without leukocytosis, it is unlikely that the patient has a new pneumonia. Patient was given a Flutter valve and Pulmonary Toiletry.   Diastolic CHF: TEE on 9/45/03 showed EF 55-60%. On exam she was not fluid overloaded and only had trace pitting edema in lower extremities bilaterally. There were no crackles on  exam. We continued her home Lasix 80 mg BID, Clonidine 0.3 mg change weekly, Doxazosin 4 mg daily, Hydralazine 100 mg TID, Labetalol 800 mg BID.  HIV: CD4 240 and HIV RNA < 20 on 02/05/14. We continued her home medications of Abacavir 600 mg daily, Acyclovir 800 mg daily, Darunavir Ethanolate 800 mg daily, Lamivudine 150 mg daily and Ritonavir 100 mg daily.   HTN: BP 155/77 on presentation to ED. We continued her home medications of Clonidine 0.3 mg change weekly, Doxazosin 4 mg daily, Hydralazine 100 mg TID, and Labetalol 800 mg BID   GERD: Patient had no complaints of nausea or vomiting. We gave her Phenergan 12.5 mg Q6H prn nausea and continued her Protonix 40 mg daily.  Discharge Vitals:   BP 170/108  Pulse 86  Temp(Src) 98.6 F (37 C) (Oral)  Resp 26  SpO2 98%  Discharge Labs:  Results for orders placed during the hospital encounter of 03/06/14 (from the past 24 hour(s))  COMPREHENSIVE METABOLIC PANEL     Status: Abnormal   Collection Time    03/06/14 12:58 AM      Result Value Ref Range   Sodium 143  137 - 147 mEq/L   Potassium 3.8  3.7 - 5.3 mEq/L   Chloride 109  96 - 112 mEq/L   CO2 15 (*) 19 - 32 mEq/L   Glucose, Bld 217 (*) 70 - 99 mg/dL   BUN 34 (*) 6 - 23 mg/dL   Creatinine, Ser 2.98 (*) 0.50 - 1.10 mg/dL   Calcium 7.5 (*) 8.4 - 10.5 mg/dL   Total Protein 7.3  6.0 - 8.3 g/dL   Albumin 1.8 (*) 3.5 - 5.2 g/dL   AST 47 (*) 0 - 37 U/L   ALT 20  0 - 35 U/L   Alkaline Phosphatase 492 (*) 39 - 117 U/L  Total Bilirubin 2.1 (*) 0.3 - 1.2 mg/dL   GFR calc non Af Amer 17 (*) >90 mL/min   GFR calc Af Amer 19 (*) >90 mL/min   Anion gap 19 (*) 5 - 15  I-STAT CG4 LACTIC ACID, ED     Status: None   Collection Time    03/06/14  1:03 AM      Result Value Ref Range   Lactic Acid, Venous 1.18  0.5 - 2.2 mmol/L  URINALYSIS, ROUTINE W REFLEX MICROSCOPIC     Status: Abnormal   Collection Time    03/06/14  1:29 AM      Result Value Ref Range   Color, Urine YELLOW  YELLOW    APPearance CLEAR  CLEAR   Specific Gravity, Urine 1.013  1.005 - 1.030   pH 6.0  5.0 - 8.0   Glucose, UA 250 (*) NEGATIVE mg/dL   Hgb urine dipstick NEGATIVE  NEGATIVE   Bilirubin Urine SMALL (*) NEGATIVE   Ketones, ur NEGATIVE  NEGATIVE mg/dL   Protein, ur >300 (*) NEGATIVE mg/dL   Urobilinogen, UA 0.2  0.0 - 1.0 mg/dL   Nitrite NEGATIVE  NEGATIVE   Leukocytes, UA NEGATIVE  NEGATIVE  URINE MICROSCOPIC-ADD ON     Status: Abnormal   Collection Time    03/06/14  1:29 AM      Result Value Ref Range   Squamous Epithelial / LPF RARE  RARE   WBC, UA 3-6  <3 WBC/hpf   RBC / HPF 0-2  <3 RBC/hpf   Bacteria, UA RARE  RARE   Casts HYALINE CASTS (*) NEGATIVE  CBC WITH DIFFERENTIAL     Status: Abnormal   Collection Time    03/06/14  1:54 AM      Result Value Ref Range   WBC 4.0  4.0 - 10.5 K/uL   RBC 2.49 (*) 3.87 - 5.11 MIL/uL   Hemoglobin 8.0 (*) 12.0 - 15.0 g/dL   HCT 25.5 (*) 36.0 - 46.0 %   MCV 102.4 (*) 78.0 - 100.0 fL   MCH 32.1  26.0 - 34.0 pg   MCHC 31.4  30.0 - 36.0 g/dL   RDW 16.9 (*) 11.5 - 15.5 %   Platelets 162  150 - 400 K/uL   Neutrophils Relative % 59  43 - 77 %   Neutro Abs 2.3  1.7 - 7.7 K/uL   Lymphocytes Relative 26  12 - 46 %   Lymphs Abs 1.1  0.7 - 4.0 K/uL   Monocytes Relative 13 (*) 3 - 12 %   Monocytes Absolute 0.5  0.1 - 1.0 K/uL   Eosinophils Relative 2  0 - 5 %   Eosinophils Absolute 0.1  0.0 - 0.7 K/uL   Basophils Relative 0  0 - 1 %   Basophils Absolute 0.0  0.0 - 0.1 K/uL  AMMONIA     Status: Abnormal   Collection Time    03/06/14  5:55 AM      Result Value Ref Range   Ammonia 82 (*) 11 - 60 umol/L  TROPONIN I     Status: None   Collection Time    03/06/14  5:55 AM      Result Value Ref Range   Troponin I <0.30  <0.30 ng/mL  KETONES, QUALITATIVE     Status: None   Collection Time    03/06/14  5:55 AM      Result Value Ref Range   Acetone, Bld NEGATIVE  NEGATIVE  I-STAT VENOUS BLOOD GAS, ED     Status: Abnormal   Collection Time     03/06/14  6:06 AM      Result Value Ref Range   pH, Ven 7.420 (*) 7.250 - 7.300   pCO2, Ven 28.3 (*) 45.0 - 50.0 mmHg   pO2, Ven 113.0 (*) 30.0 - 45.0 mmHg   Bicarbonate 18.3 (*) 20.0 - 24.0 mEq/L   TCO2 19  0 - 100 mmol/L   O2 Saturation 99.0     Acid-base deficit 5.0 (*) 0.0 - 2.0 mmol/L   Sample type VENOUS      Signed: Osa Craver, DO PGY-1 Internal Medicine Resident Pager # [phone] 03/06/2014 3:48 PM

## 2014-03-06 NOTE — ED Provider Notes (Signed)
Assumed care of pt from Dr. Rhunette Croft.  Pt presents with dyspnea x 1 week, recently treated for HCAP and Cdiff, has multiple medical comorbidities.  Internal medicine dispo pending.    Admitted in stable condition.    Mirian Mo, MD 03/07/14 [phone]

## 2014-03-06 NOTE — ED Notes (Signed)
Spoke with [facility], They have sent for pt. For [place] of abdomen

## 2014-03-06 NOTE — ED Notes (Signed)
Spoke with Homero Fellers in Pharmacy,  He is aware of needing medications for pt. And will be sending them shortly to [facility],

## 2014-03-06 NOTE — H&P (Signed)
Medicine Attending admission note: I personally interviewed and examined this patient and reviewed lab database and pertinent radiographic studies. History, physical findings, evaluation, and management plan, accurate as recorded by resident physician Dr. Jill Alexanders.  Clinical summary: Unfortunate 53 year old woman with multiple medical problems and frequent admissions to the hospital. She has underlying HIV/AIDS, chronic hepatitis C infection with chronic hepatic failure, bipolar disorder, chronic renal insufficiency stage IV, chronic normochromic anemia, chronic diastolic heart failure, essential hypertension, and GERD. She is deconditioned from a recent hospitalization in September for pneumonia, hypertensive urgency, and C. difficile colitis. She had a brief stay in a extended care facility. Her sister Ailene Ravel is her main caregiver. Over the last few days, her condition has deteriorated. She has generalized weakness and is not ambulatory. She has been incontinent of urine and stool. She has recurrent encephalopathy likely related to discontinuation of her lactulose. Serum ammonia level as high as 161 recorded on August 8. It remains moderately elevated at 82 today. On initial exam: Blood pressure 180/83, pulse 80, temperature 98.1 F (36.7 C), temperature source Oral, resp. rate 26, SpO2 97.00%. She was somnolent but easily arousable with verbal simulation. When she was awake she was talkative with fluent speech and able to follow commands. She is missing many teeth from her lower mandible. Lungs overall clear and resonant to percussion. Regular cardiac rhythm no murmur. Abdomen is soft, distended, mildly tender in the right upper quadrant. Question fluid wave. No obvious mass or organomegaly. Extremities with venous stasis changes. Chronic 1-2+ edema. Neurologic: Pupils equal round reactive to light. Upper extremity strength 4+ over 5. Lower extremity strength 3+ over 5. She has difficulty  lifting her legs against gravity and resistance. Distal strength at the ankles is normal. Pertinent lab: BUN 34 creatinine 3 compared with 2.5 on October 5 and 2.5 on September 11. Chronic liver function abnormalities with improved alkaline phosphatase is down from peak value of 704 when she was admitted with sepsis in August to current value of 492. Bilirubin down from 4.5-2.1 and stable compared with results from 02/03/2014. Hemoglobin 8.0, MCV 102, white count 4000 with 59% neutrophils, platelets 162,000, all close to her recent baseline values.  Impression: Generalized weakness secondary to deconditioning. Overwhelming nursing needs that can no longer be handled by her immediate family. Plan: As outlined in Dr. Berenda Morale note. We will check an abdominal ultrasound to rule out ascites. Reinstitute lactulose to control encephalopathy. Continue anti-HIV medications and antihypertensives. Social services consult to assist in placement in an extended care facility.  Cephas Darby, M.D., FACP

## 2014-03-06 NOTE — ED Notes (Signed)
I stat lactic acid results given to Dr. Nanavati by B. Sadey Yandell, EMT 

## 2014-03-07 ENCOUNTER — Ambulatory Visit: Payer: Self-pay | Admitting: Internal Medicine

## 2014-03-08 LAB — URINE CULTURE: Colony Count: 80000

## 2014-03-10 ENCOUNTER — Telehealth (HOSPITAL_BASED_OUTPATIENT_CLINIC_OR_DEPARTMENT_OTHER): Payer: Self-pay

## 2014-03-10 NOTE — Telephone Encounter (Signed)
Post ED Visit - Positive Culture Follow-up  Culture report reviewed by antimicrobial stewardship pharmacist: []  Wes Dulaney, Pharm.D., BCPS []  Celedonio Miyamoto, Pharm.D., BCPS []  Georgina Pillion, [address].D., BCPS []  [place], [address].D., BCPS, AAHIVP [x]  Estella Husk, Pharm.D., BCPS, AAHIVP []  Carly Sabat, Pharm.D. []  Enzo Bi, [address].D.  Positive Urine culture Treated with Levofloxacin, organism sensitive to the same and no further patient follow-up is required at this time.  Arvid Right 03/10/2014, 5:27 AM

## 2014-03-12 ENCOUNTER — Ambulatory Visit: Payer: Self-pay | Admitting: Internal Medicine

## 2014-03-12 LAB — CULTURE, BLOOD (ROUTINE X 2)
CULTURE: NO GROWTH
Culture: NO GROWTH

## 2014-03-21 ENCOUNTER — Encounter: Payer: Self-pay | Admitting: Internal Medicine

## 2014-03-21 ENCOUNTER — Ambulatory Visit (INDEPENDENT_AMBULATORY_CARE_PROVIDER_SITE_OTHER): Payer: Medicaid Other | Admitting: Internal Medicine

## 2014-03-21 VITALS — BP 192/88 | HR 97 | Temp 97.8°F

## 2014-03-21 DIAGNOSIS — Z09 Encounter for follow-up examination after completed treatment for conditions other than malignant neoplasm: Secondary | ICD-10-CM

## 2014-03-21 DIAGNOSIS — I10 Essential (primary) hypertension: Secondary | ICD-10-CM

## 2014-03-21 NOTE — Assessment & Plan Note (Signed)
BP Readings from Last 3 Encounters:  03/21/14 192/88  03/06/14 148/70  03/03/14 155/77    Lab Results  Component Value Date   NA 143 03/06/2014   K 3.8 03/06/2014   CREATININE 2.98* 03/06/2014    Assessment: Blood pressure control:   Progress toward BP goal:    Comments: elevated   Plan: Medications:  clonidine 0.3mg  patch, doxazosin 4 mg qd, lasix 80 mg BID, hydralazine 100mg  TID, and labetalol 800 mg BID Educational resources provided:   Self management tools provided:   Other plans: The patient is on maximal doses and maximal therapy at this time we'll continue to monitor

## 2014-03-21 NOTE — Patient Instructions (Signed)
General Instructions:   Please bring your medicines with you each time you come to clinic.  Medicines may include prescription medications, over-the-counter medications, herbal remedies, eye drops, vitamins, or other pills.   Progress Toward Treatment Goals:  Treatment Goal 03/03/2014  Blood pressure improved    Self Care Goals & Plans:  Self Care Goal 03/21/2014  Manage my medications bring my medications to every visit; refill my medications on time  Monitor my health -  Eat healthy foods -    No flowsheet data found.   Care Management & Community Referrals:  No flowsheet data found.

## 2014-03-21 NOTE — Progress Notes (Signed)
Subjective:   Patient ID: Michelle [name] female   DOB: April 13, 1961 53 y.o.   MRN: [id number]  HPI: Michelle [name] is a 53 y.o. woman pmh as listed below presents for HFU.   Pt is still at the rehabilitation facility. She still reports having some weakness but is improving with physical therapy. She denies having any more dysuria or pyuria. But does continue to admit to stooling and urinating in the bed as "retaliation against the staff." The patient also has these ideas that her family members are plotting against her and trying to make her stay longer at the SNF so that they can live in her apartment.  She denies having any chest pain, shortness of breath, dyspnea on exertion, but is easily fatigable with exercise. She does not have headache or blurry vision. And her lower extremity edema is about baseline.  Past Medical History  Diagnosis Date  . Meningitis   . HIV (human immunodeficiency virus infection) 1981  . Hypertension   . Gout   . Muscle spasms of head and/or neck   . Hypertensive encephalopathy ~ 05/2013    hospitalaized/notes 06/18/2013  . Anxiety     [name] 06/18/2013  . History of syphilis   . High cholesterol   . Cirrhosis     hepatitis C   . GERD (gastroesophageal reflux disease)   . Psychosis   . Chronic venous stasis dermatitis of both lower extremities   . CRD (chronic renal disease), stage IV   . Nephrotic syndrome   . CHF (congestive heart failure)     "one time" (01/22/2014)  . Myocardial infarction 2015    "ealy part of this year"  . End stage liver disease   . AIHA (autoimmune hemolytic anemia)     [name] 06/18/2013  . Anemia   . HCAP (healthcare-associated pneumonia) 12/2013  . Exertional shortness of breath   . Type II diabetes mellitus   . History of blood transfusion 12/2013 X ?    "I've had a few recently" (01/22/2014)  . Hepatitis C     pt denies this hx on 01/22/2014  . Migraine     "all the time" (01/22/2014)  . Seizures     "last sz was 10/2013"  (01/22/2014)  . Stroke     "I've had a few; I've recovered from all of them; had one earlier this year too" (01/22/2014)  . Arthritis     "all over; comes and goes" (01/22/2014)  . Chronic back pain     "on and off" (01/22/2014)   Current Outpatient Prescriptions  Medication Sig Dispense Refill  . abacavir (ZIAGEN) 300 MG tablet Take 600 mg by mouth daily.       [name] acetaminophen (TYLENOL) 325 MG tablet Take 650 mg by mouth every 6 (six) hours as needed (Fever).      [name] acyclovir (ZOVIRAX) 800 MG tablet Take 800 mg by mouth daily.       [name] aspirin EC 81 MG EC tablet Take 1 tablet (81 mg total) by mouth daily.      . cloNIDine (CATAPRES - DOSED IN MG/24 HR) 0.3 mg/24hr patch Place 1 patch (0.3 mg total) onto the skin daily.  30 patch  12  . darbepoetin (ARANESP) 200 MCG/0.4ML SOLN injection Inject 0.4 mLs (200 mcg total) into the skin every 14 (fourteen) days.  1.68 mL    . Darunavir Ethanolate (PREZISTA) 800 MG tablet Take 800 mg by mouth daily with breakfast.      .  doxazosin (CARDURA) 4 MG tablet Take 1 tablet (4 mg total) by mouth at bedtime.      . furosemide (LASIX) 80 MG tablet Take 1 tablet (80 mg total) by mouth 2 (two) times daily.      . hydrALAZINE (APRESOLINE) 100 MG tablet Take 1 tablet (100 mg total) by mouth every 8 (eight) hours.  90 tablet  3  . hydrocerin (EUCERIN) CREA Apply 1 application topically 2 (two) times daily.  113 g  0  . hydrOXYzine (ATARAX/VISTARIL) 10 MG tablet Take 1 tablet (10 mg total) by mouth 3 (three) times daily as needed for itching.  30 tablet  0  . insulin aspart (NOVOLOG) 100 UNIT/ML injection Inject 10-25 Units into the skin 3 (three) times daily before meals. Based on sliding scale      . labetalol (NORMODYNE) 200 MG tablet Take 4 tablets (800 mg total) by mouth 2 (two) times daily.      [name]. lacosamide 150 MG TABS Take 1 tablet (150 mg total) by mouth 2 (two) times daily.  60 tablet    . lactulose (CHRONULAC) 10 GM/15ML solution Take 30 mLs (20 g total) by  mouth 3 (three) times daily.  240 mL  0  . lamiVUDine (EPIVIR) 10 MG/ML solution Take 15 mLs (150 mg total) by mouth daily.  240 mL  12  . levETIRAcetam (KEPPRA) 500 MG tablet Take 1,500 mg by mouth 2 (two) times daily.      [name]. LORazepam (ATIVAN) 1 MG tablet Take 1 mg by mouth every 8 (eight) hours as needed for anxiety.      [name]. omeprazole (PRILOSEC) 20 MG capsule Take 20 mg by mouth daily before breakfast.      . potassium chloride (K-DUR,KLOR-CON) 10 MEQ tablet Take 10 mEq by mouth daily.      . ritonavir (NORVIR) 100 MG capsule Take 100 mg by mouth daily with breakfast.      . sodium bicarbonate 650 MG tablet Take 650 mg by mouth 2 (two) times daily.       No current facility-administered medications for this visit.   Family History  Problem Relation Age of Onset  . Cancer - Colon Mother   . Cancer Father   . Hypertension Father   . Diabetes    . Diabetes Sister    History   Social History  . Marital Status: Single    Spouse Name: N/A    Number of Children: 4  . Years of Education: 11th   Social History Main Topics  . Smoking status: Former Smoker    Types: Cigarettes    Quit date: 06/19/2010  . Smokeless tobacco: Never Used  . Alcohol Use: No  . Drug Use: Yes    Special: Cocaine, Marijuana, "Crack" cocaine     Comment: past history of alcohol, cocaine and IV drug use; "stopped drugs in 2012"  . Sexual Activity: Not Currently    Partners: Male   Other Topics Concern  . None   Social History Narrative   Patient lives at home with sister.    Patient is unemployed.    Patient is single.    Patient has 2 alive and 2 deceased.    Patient has 11th grade education.          Review of Systems:Pertinent items are noted in HPI. Objective:  Physical Exam: Filed Vitals:   03/21/14 1551  BP: 192/88  Pulse: 97  Temp: 97.8 F (36.6 C)  TempSrc: Oral  SpO2:  100%   General: sitting in wheelchair, NAD HEENT: PERRL, EOMI, no scleral icterus Cardiac: RRR, no rubs,  murmurs or gallops Pulm: clear to auscultation bilaterally, moving normal volumes of air Abd: soft, nontender, nondistended, BS present Ext: warm and well perfused, no pedal edema, excoriations on lower extremities bilaterally Neuro: alert and oriented X3, cranial nerves II-XII grossly intact  Assessment & Plan:  Please see problem oriented charting  Pt discussed with Dr. Rogelia Boga

## 2014-03-26 ENCOUNTER — Encounter (HOSPITAL_COMMUNITY): Payer: Self-pay

## 2014-03-28 NOTE — Progress Notes (Signed)
[facility] Attending  Case discussed with Dr. Sadek soon after the resident saw the patient.  We reviewed the resident's history and exam and pertinent patient test results.  I agree with the assessment, diagnosis, and plan of care documented in the resident's note. 

## 2014-04-29 DEATH — deceased

## 2014-09-02 IMAGING — CT CT ABD-PELV W/O CM
2 of 4 series · 13 of 46 positions shown, 15 images · non-contrast
Comparison: Radiographs 12/17/2013

CLINICAL DATA: Distended abdomen, jaundice, difficulty urinating,
altered mental status

EXAM:
CT ABDOMEN AND PELVIS WITHOUT CONTRAST
TECHNIQUE: Multidetector CT imaging of the abdomen and pelvis was performed
following the standard protocol without IV contrast.

[Series 201: routine, idose (2) · axial · 0.71mm/px · z∈[-496,-141]mm · 10 of 85 slices shown, 12 images]
[im 7/85  soft-tissue]
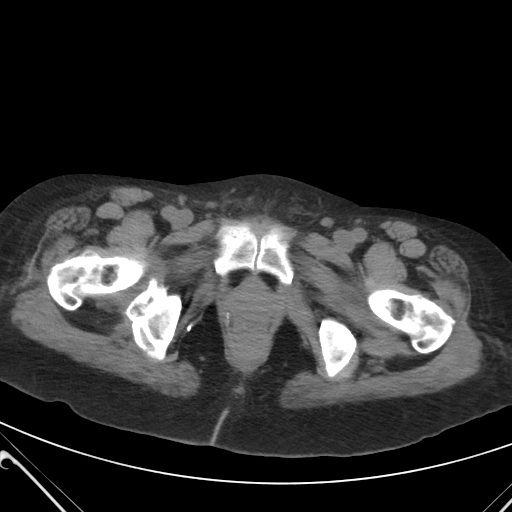
[im 7/85  bone]
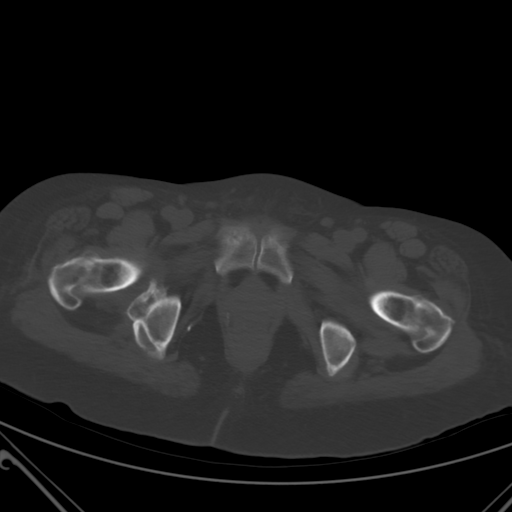
[im 14/85  soft-tissue]
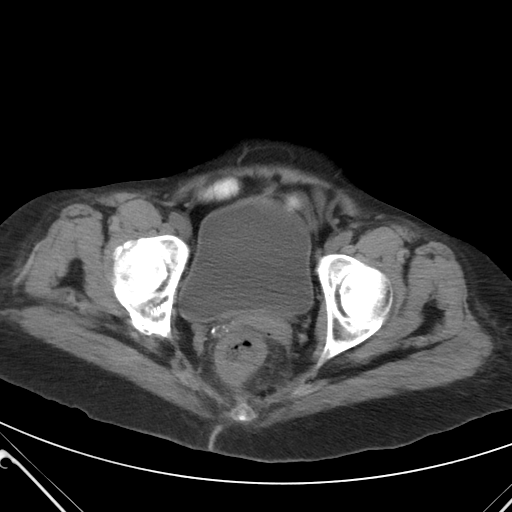
[im 24/85  soft-tissue]
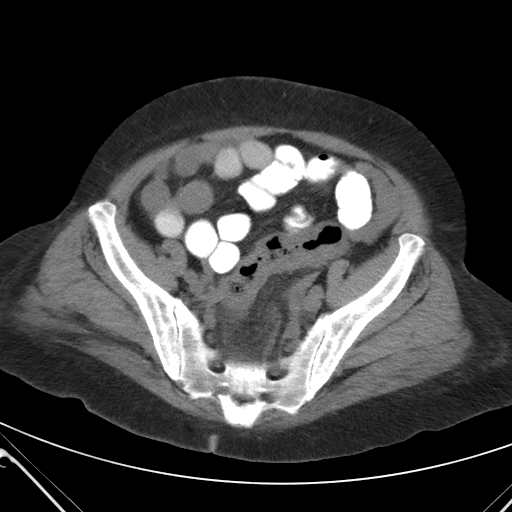
[im 31/85  soft-tissue]
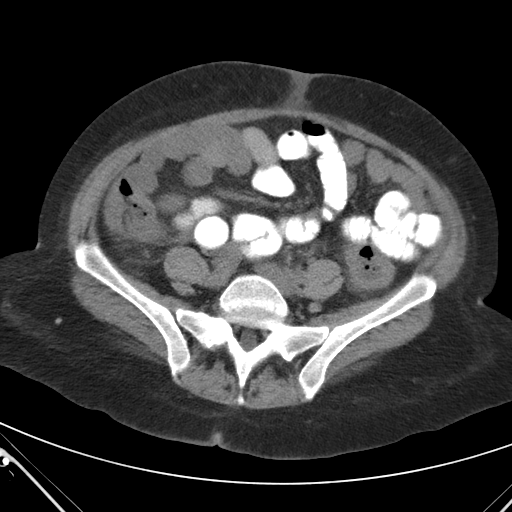
[im 37/85  soft-tissue]
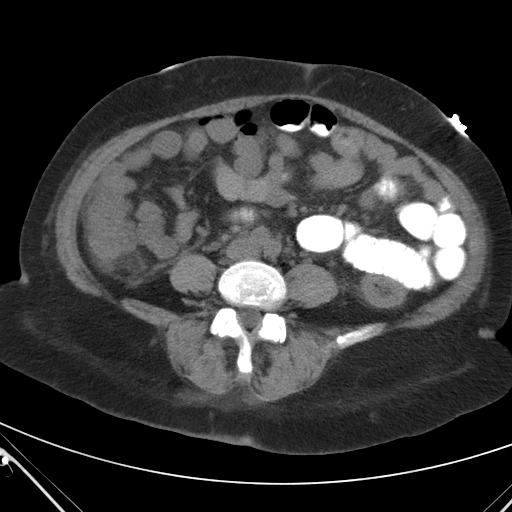
[im 48/85  soft-tissue]
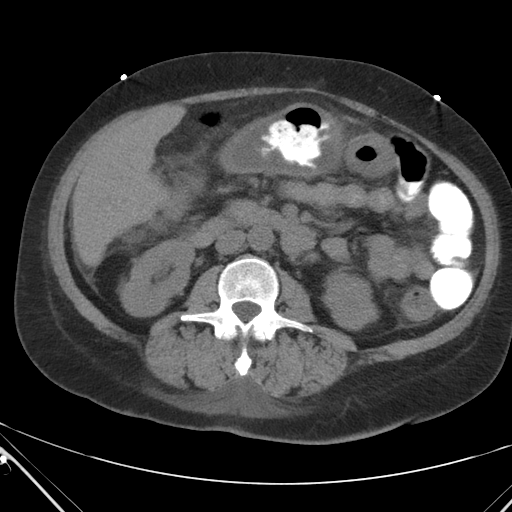
[im 54/85  soft-tissue]
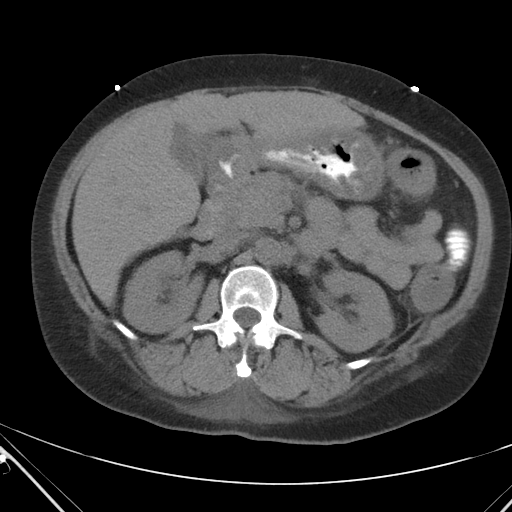
[im 64/85  soft-tissue]
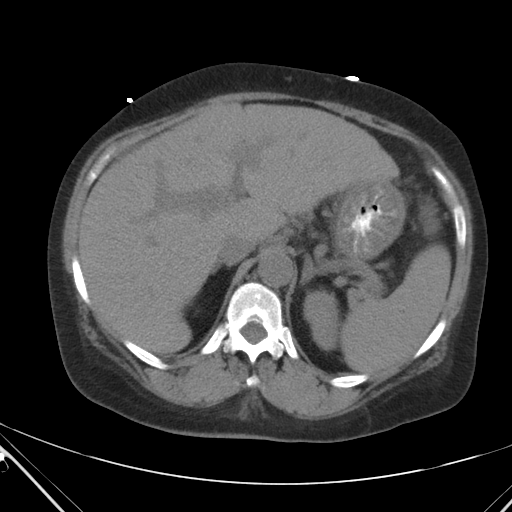
[im 71/85  soft-tissue]
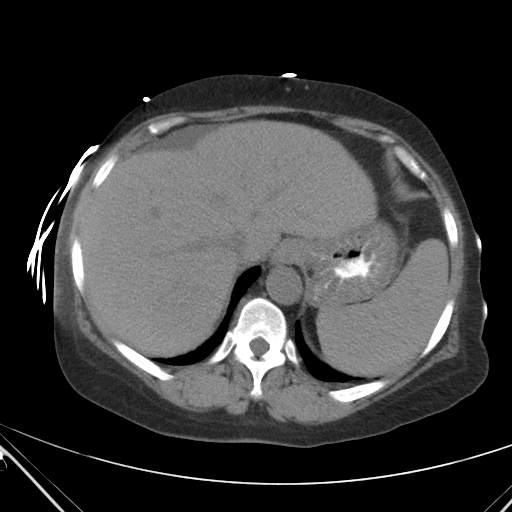
[im 71/85  bone]
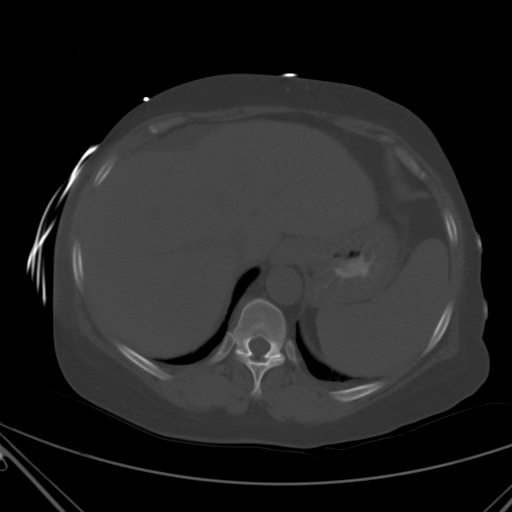
[im 78/85  soft-tissue]
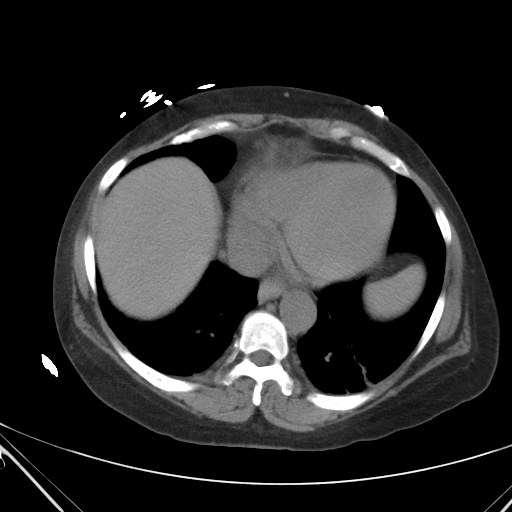

[Series 203: coronals, idose (2) · coronal · 0.50mm/px · 3 of 117 slices shown]
[im 39/117  soft-tissue]
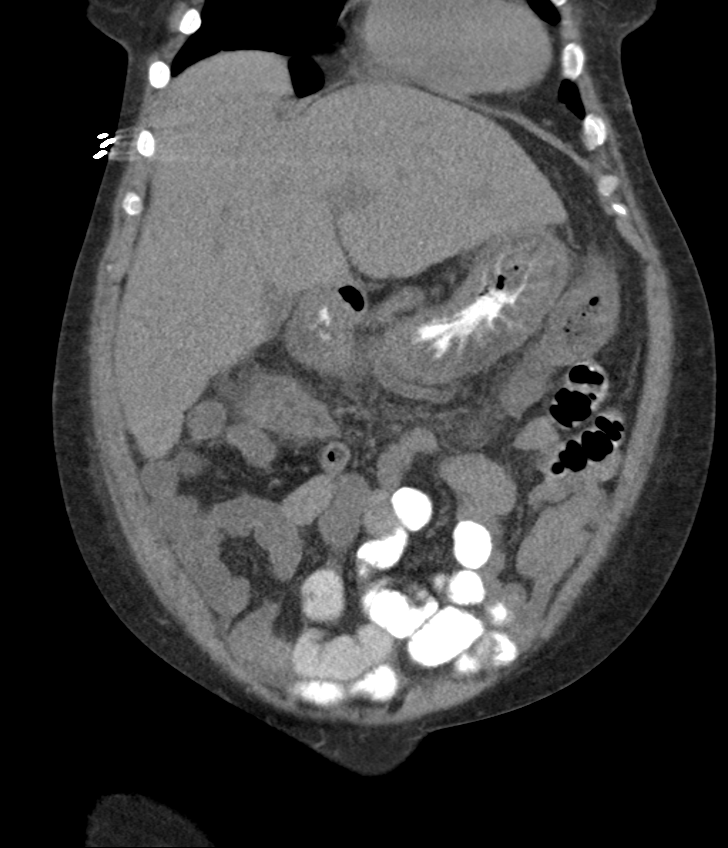
[im 52/117  soft-tissue]
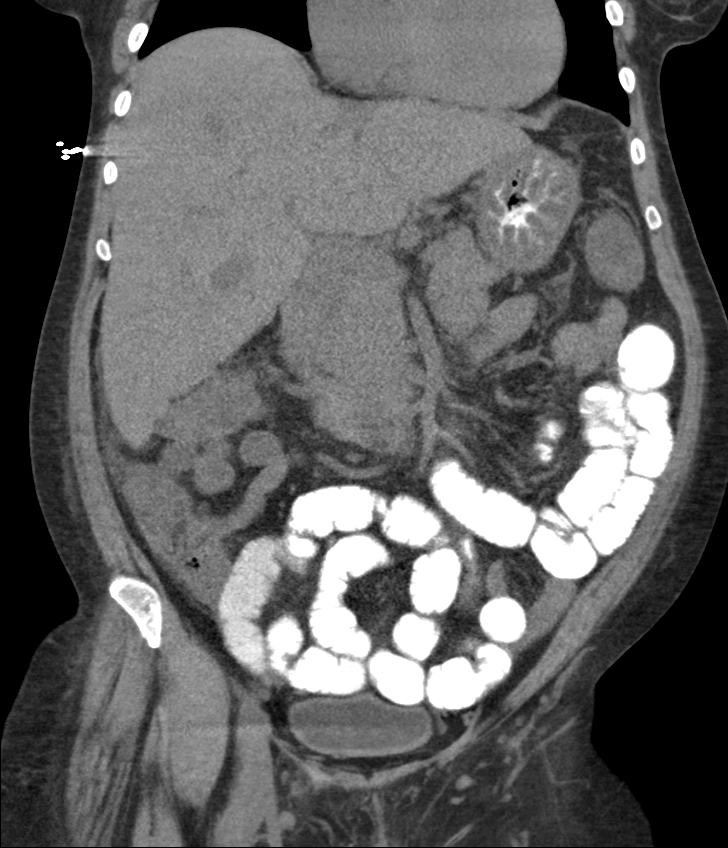
[im 65/117  soft-tissue]
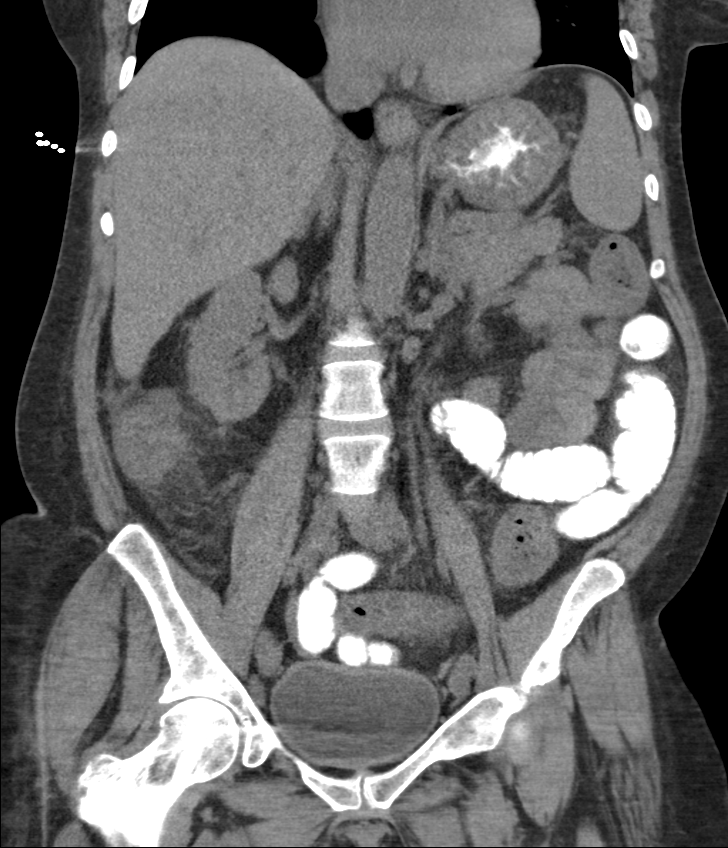

[13 of 46 positions shown; findings below may reference images not displayed]

FINDINGS: Bilateral subsegmental atelectasis at the lung bases. No pleural or
pericardial effusion.

Mildly nodular contour to the liver. Gallbladder is normal.
Noncontrast appearance suggests the possibility of an element
periportal edema. Small volume of ascites around the liver.

Spleen is normal. Pancreas is normal. Adrenal glands are normal.
Kidneys are normal. Abdominal aorta shows minimal calcification with
no distention.

Bladder is normal.  Reproductive organs are normal.

Stomach is decompressed which exaggerates gastric wall thickness.
Allowing for this, gastric wall appears mildly thickened
nonetheless. Small bowel is normal. Evaluation of the colon is
difficult without IV contrast, with oral contrast not having reached
the colon. The colon is not a dilated, but the does appear to be
diffuse colonic wall thickening. Mild to moderate inflammatory
change tracks along the entire colon.

There are no acute musculoskeletal abnormalities.
IMPRESSION: 1. Small volume of ascites. Mildly nodular liver contour. Findings
suggest possibility of cirrhosis.
2. Stomach is decompressed but shows evidence of wall thickening.
Possible gastritis.
3. Evidence of mild diffuse colon wall thickening with inflammatory
change surrounding the entire colon. Diffuse colitis suspected.

## 2023-06-05 NOTE — Telephone Encounter (Signed)
 error
